# Patient Record
Sex: Male | Born: 1955
Health system: Southern US, Community
[De-identification: ages and names within clinical notes are randomized; demographics above are authoritative.]

## PROBLEM LIST (undated history)

## (undated) DIAGNOSIS — J449 Chronic obstructive pulmonary disease, unspecified: Secondary | ICD-10-CM

## (undated) DIAGNOSIS — F17210 Nicotine dependence, cigarettes, uncomplicated: Secondary | ICD-10-CM

## (undated) DIAGNOSIS — I10 Essential (primary) hypertension: Secondary | ICD-10-CM

## (undated) DIAGNOSIS — E785 Hyperlipidemia, unspecified: Secondary | ICD-10-CM

## (undated) DIAGNOSIS — F209 Schizophrenia, unspecified: Secondary | ICD-10-CM

## (undated) HISTORY — DX: Schizophrenia, unspecified: F20.9

## (undated) HISTORY — PX: NO PAST SURGERIES: SHX2092

---

## 2001-10-07 ENCOUNTER — Inpatient Hospital Stay (HOSPITAL_COMMUNITY): Admission: EM | Admit: 2001-10-07 | Discharge: 2001-10-13 | Payer: Self-pay | Admitting: Emergency Medicine

## 2001-10-07 ENCOUNTER — Encounter: Payer: Self-pay | Admitting: Emergency Medicine

## 2001-10-10 ENCOUNTER — Encounter: Payer: Self-pay | Admitting: Pulmonary Disease

## 2001-10-11 ENCOUNTER — Encounter: Payer: Self-pay | Admitting: Pulmonary Disease

## 2006-11-15 ENCOUNTER — Emergency Department (HOSPITAL_COMMUNITY): Admission: EM | Admit: 2006-11-15 | Discharge: 2006-11-15 | Payer: Self-pay | Admitting: Family Medicine

## 2008-08-08 ENCOUNTER — Ambulatory Visit (HOSPITAL_BASED_OUTPATIENT_CLINIC_OR_DEPARTMENT_OTHER): Admission: RE | Admit: 2008-08-08 | Discharge: 2008-08-08 | Payer: Self-pay | Admitting: Pulmonary Disease

## 2008-08-20 ENCOUNTER — Ambulatory Visit: Payer: Self-pay | Admitting: Internal Medicine

## 2009-05-29 ENCOUNTER — Ambulatory Visit (HOSPITAL_COMMUNITY): Admission: RE | Admit: 2009-05-29 | Discharge: 2009-05-29 | Payer: Self-pay | Admitting: Pulmonary Disease

## 2009-09-11 ENCOUNTER — Ambulatory Visit (HOSPITAL_COMMUNITY): Admission: RE | Admit: 2009-09-11 | Discharge: 2009-09-11 | Payer: Self-pay | Admitting: Pulmonary Disease

## 2010-07-30 NOTE — Procedures (Signed)
NAME:  RAE, SUTCLIFFE NO.:  [id number]   MEDICAL RECORD NO.:  [id number]          PATIENT TYPE:  OUT   LOCATION:  SLEEP CENTER                 FACILITY:  [facility]   PHYSICIAN:  Clinton D. Maple Hudson, MD, FCCP, FACPDATE OF BIRTH:  [birth year] 09[birth year] 1958   DATE OF STUDY:  08/08/2008                            NOCTURNAL POLYSOMNOGRAM   REFERRING PHYSICIAN:  Mina Marble, M.D.   REFERRING PHYSICIAN:  Mina Marble, MD   INDICATION FOR STUDY:  Hypersomnia with sleep apnea.   EPWORTH SLEEPINESS SCORE:  3/24, BMI 19, weight 140 pounds, height 72  inches, neck 14.5 inches.   MEDICATIONS:  Home medications are charted and reviewed.  The study was  done as a daytime study to correspond to the patient's work and sleep  schedule.   SLEEP ARCHITECTURE:  The study was begun at 9:16 a.m.  Lights returned  on at 1517.  Total sleep time, no minutes of sleep.  He was awake  throughout the reporting.   RESPIRATORY DATA:   OXYGEN DATA:   CARDIAC DATA:   MOVEMENT-PARASOMNIA:   IMPRESSIONS-RECOMMENDATIONS:  1. Daytime scheduled polysomnogram to accommodate the patient's usual      sleep schedule with no sleep medication taken.  2. The patient did not achieve sleep during this recording.  He had      previously indicated inability to sleep at night.  Further clinical      discussion may be useful to determine sleep habits, contributory      and antagonistic factors.  On arrival, he reported that he had      slept a total of 3 hours on the night before this study but then      admitted he went      to bed at 2200 and awoke at 0600 hours.  Hypoxia was noted with      exertion walking to the bathroom.  That effort dropped room air      saturation from 92% at rest on arrival on room air to 87% during      exertion.      Clinton D. Maple Hudson, MD, [facility], FACP  Diplomate, Biomedical engineer of Sleep Medicine  Electronically Signed     CDY/MEDQ  D:  08/20/2008 15:11:48  T:   08/21/2008 03:41:04  Job:  [id number]

## 2010-08-02 NOTE — Discharge Summary (Signed)
NAMEDELMAS, FAUCETT NO.:  [id number]   MEDICAL RECORD NO.:  [id number]                   PATIENT TYPE:  INP   LOCATION:  4703                                 FACILITY:  [facility]   PHYSICIAN:  Mina Marble, M.D.          DATE OF BIRTH:  1955-10-29   DATE OF ADMISSION:  10/07/2001  DATE OF DISCHARGE:  10/13/2001                                 DISCHARGE SUMMARY   ADMITTING DIAGNOSES:  1. Pneumonia, bibasilar.  2. Chronic obstructive pulmonary disease, severe, with asthma.  3. Bullous pulmonary emphysema, severe.  4. Hypertensive cardiovascular disease, mild.  5. Mental impairment, moderate.  6. History of tobacco abuse.   DISCHARGE DIAGNOSES:  1. Pneumonia, right lower lobe, resolving.  2. Chronic obstructive pulmonary disease, severe, with asthma.  3. Bullous pulmonary emphysema, severe.  4. Hypertensive cardiovascular disease, history of.  5. Mental impairment with episodes of agitation.   REASON FOR HOSPITALIZATION:  The patient is a 55 year old black male with  known severe COPD with bullous emphysema, who was admitted from the [facility] ED with complaints of increasing shortness of breath for six  to seven days, becoming more severe this a.m., and is admitted with  pneumonia for further care.   ALLERGIES:  No known drug allergies.   LABORATORY AND ACCESSORY CLINICAL DATA:  EKG on October 07, 2001 showed sinus  tachycardia.  CT of the chest showed peripheral opacities in both lower  lobes, right side greater than left, suspicious for pneumonia, no other  evidence of mass or adenopathy, severe bullous emphysema.  Chest x-ray on  October 10, 2001 showed severe bullous emphysema and focal rounded opacity at  the right lung base.   On October 07, 2001, CBC was within normal limits.  On October 07, 2001, chemistry  was within normal limits except for sodium of 133, glucose of 114, BUN of 5.  On October 07, 2001, theophylline level was 2.8.   On October 11, 2001,  theophylline level of 7.9.  On October 07, 2001, blood cultures -- no growth  after five days.  Respiratory culture just showed normal oropharyngeal  flora.   HOSPITAL COURSE:  The patient was admitted.  IV therapy was initiated.  Sputum cultures and blood cultures were obtained and the patient was then  started on Rocephin 1 g IV q.d.  Also, ABG was obtained on room air.  The  patient was started on albuterol nebulizer treatments q.4h. and he was also  started on theophylline CR 100 mg q.a.m. and q.h.s. and Humibid LA one p.o.  q.a.m. and q.h.s.; also added to the plan of care was Flovent metered-dose  inhaler 110 two puffs q.a.m. and q.h.s. and also Biaxin 500 mg tab p.o.  b.i.d.  He had chest x-ray obtained.  Also, his Biaxin was discontinued.  He  also had a chest CT.  He was started on Tussionex suspension 5 cc q.12h. and  give  for his cough.  His IV was discontinued.   DISCHARGE CONDITION:  The patient was discharged home in stable condition.  The patient continued to improve with less cough and he was able to walk  without severe shortness of breath and his appetite was better.   DISCHARGE MEDICATIONS:  1. Theophylline CR 300 mg tab one q.a.m. and q.h.s.  2. Flovent 110 metered-dose inhaler two puffs q.a.m. and q.h.s. or Advair     250/50 q.a.m. and q.h.s.  3. Ceftin 250 mg tab one b.i.d. x5 more days.  4. Avalide 150/12.5 mg tab one q.d.  5. Tussionex suspension 5 cc q.12h. p.r.n. for cough.  6. Albuterol metered-dose inhaler two puffs q.4h. p.r.n. for wheezing and     for frequent cough.  7. Risperdal 0.25 mg tab one q.h.s.   ACTIVITY:  Ambulated as tolerated.   DIET:  Regular, no added-salt diet.   FOLLOWUP:  Call the office for followup office visit in September 2003.      Dionne D. Retta Mac, M.D.    DDR/MEDQ  D:  11/02/2001  T:  11/05/2001  Job:  [id number]

## 2010-09-10 ENCOUNTER — Other Ambulatory Visit: Payer: Self-pay | Admitting: Gastroenterology

## 2010-09-10 DIAGNOSIS — R1013 Epigastric pain: Secondary | ICD-10-CM

## 2010-09-12 ENCOUNTER — Ambulatory Visit
Admission: RE | Admit: 2010-09-12 | Discharge: 2010-09-12 | Disposition: A | Discharge status: Home / Self Care | Payer: Medicare Other | Source: Ambulatory Visit | Attending: Gastroenterology | Admitting: Gastroenterology

## 2010-09-12 DIAGNOSIS — R1013 Epigastric pain: Secondary | ICD-10-CM

## 2011-05-20 DIAGNOSIS — L821 Other seborrheic keratosis: Secondary | ICD-10-CM | POA: Diagnosis not present

## 2011-05-20 DIAGNOSIS — I10 Essential (primary) hypertension: Secondary | ICD-10-CM | POA: Diagnosis not present

## 2011-05-20 DIAGNOSIS — F17200 Nicotine dependence, unspecified, uncomplicated: Secondary | ICD-10-CM | POA: Diagnosis not present

## 2011-05-20 DIAGNOSIS — J449 Chronic obstructive pulmonary disease, unspecified: Secondary | ICD-10-CM | POA: Diagnosis not present

## 2011-05-20 DIAGNOSIS — E7800 Pure hypercholesterolemia, unspecified: Secondary | ICD-10-CM | POA: Diagnosis not present

## 2011-09-23 DIAGNOSIS — E7800 Pure hypercholesterolemia, unspecified: Secondary | ICD-10-CM | POA: Diagnosis not present

## 2011-09-23 DIAGNOSIS — Z79899 Other long term (current) drug therapy: Secondary | ICD-10-CM | POA: Diagnosis not present

## 2011-09-23 DIAGNOSIS — K269 Duodenal ulcer, unspecified as acute or chronic, without hemorrhage or perforation: Secondary | ICD-10-CM | POA: Diagnosis not present

## 2011-09-23 DIAGNOSIS — K2900 Acute gastritis without bleeding: Secondary | ICD-10-CM | POA: Diagnosis not present

## 2011-09-23 DIAGNOSIS — J449 Chronic obstructive pulmonary disease, unspecified: Secondary | ICD-10-CM | POA: Diagnosis not present

## 2012-01-20 DIAGNOSIS — F17200 Nicotine dependence, unspecified, uncomplicated: Secondary | ICD-10-CM | POA: Diagnosis not present

## 2012-01-20 DIAGNOSIS — Z79899 Other long term (current) drug therapy: Secondary | ICD-10-CM | POA: Diagnosis not present

## 2012-01-20 DIAGNOSIS — Z23 Encounter for immunization: Secondary | ICD-10-CM | POA: Diagnosis not present

## 2012-01-20 DIAGNOSIS — E7800 Pure hypercholesterolemia, unspecified: Secondary | ICD-10-CM | POA: Diagnosis not present

## 2012-01-20 DIAGNOSIS — K269 Duodenal ulcer, unspecified as acute or chronic, without hemorrhage or perforation: Secondary | ICD-10-CM | POA: Diagnosis not present

## 2012-01-20 DIAGNOSIS — J449 Chronic obstructive pulmonary disease, unspecified: Secondary | ICD-10-CM | POA: Diagnosis not present

## 2012-05-13 DIAGNOSIS — J449 Chronic obstructive pulmonary disease, unspecified: Secondary | ICD-10-CM | POA: Diagnosis not present

## 2012-05-13 DIAGNOSIS — Z79899 Other long term (current) drug therapy: Secondary | ICD-10-CM | POA: Diagnosis not present

## 2012-05-13 DIAGNOSIS — M25529 Pain in unspecified elbow: Secondary | ICD-10-CM | POA: Diagnosis not present

## 2012-05-13 DIAGNOSIS — E7800 Pure hypercholesterolemia, unspecified: Secondary | ICD-10-CM | POA: Diagnosis not present

## 2012-05-13 DIAGNOSIS — K269 Duodenal ulcer, unspecified as acute or chronic, without hemorrhage or perforation: Secondary | ICD-10-CM | POA: Diagnosis not present

## 2012-08-10 DIAGNOSIS — E7800 Pure hypercholesterolemia, unspecified: Secondary | ICD-10-CM | POA: Diagnosis not present

## 2012-08-10 DIAGNOSIS — J449 Chronic obstructive pulmonary disease, unspecified: Secondary | ICD-10-CM | POA: Diagnosis not present

## 2012-08-10 DIAGNOSIS — K269 Duodenal ulcer, unspecified as acute or chronic, without hemorrhage or perforation: Secondary | ICD-10-CM | POA: Diagnosis not present

## 2012-08-10 DIAGNOSIS — Z79899 Other long term (current) drug therapy: Secondary | ICD-10-CM | POA: Diagnosis not present

## 2012-12-27 DIAGNOSIS — Z125 Encounter for screening for malignant neoplasm of prostate: Secondary | ICD-10-CM | POA: Diagnosis not present

## 2012-12-27 DIAGNOSIS — K269 Duodenal ulcer, unspecified as acute or chronic, without hemorrhage or perforation: Secondary | ICD-10-CM | POA: Diagnosis not present

## 2012-12-27 DIAGNOSIS — J449 Chronic obstructive pulmonary disease, unspecified: Secondary | ICD-10-CM | POA: Diagnosis not present

## 2012-12-27 DIAGNOSIS — Z006 Encounter for examination for normal comparison and control in clinical research program: Secondary | ICD-10-CM | POA: Diagnosis not present

## 2012-12-27 DIAGNOSIS — J4489 Other specified chronic obstructive pulmonary disease: Secondary | ICD-10-CM | POA: Diagnosis not present

## 2012-12-27 DIAGNOSIS — Z23 Encounter for immunization: Secondary | ICD-10-CM | POA: Diagnosis not present

## 2012-12-27 DIAGNOSIS — F319 Bipolar disorder, unspecified: Secondary | ICD-10-CM | POA: Diagnosis not present

## 2012-12-27 DIAGNOSIS — I119 Hypertensive heart disease without heart failure: Secondary | ICD-10-CM | POA: Diagnosis not present

## 2012-12-27 DIAGNOSIS — E7800 Pure hypercholesterolemia, unspecified: Secondary | ICD-10-CM | POA: Diagnosis not present

## 2012-12-27 DIAGNOSIS — Z79899 Other long term (current) drug therapy: Secondary | ICD-10-CM | POA: Diagnosis not present

## 2013-04-11 DIAGNOSIS — E7800 Pure hypercholesterolemia, unspecified: Secondary | ICD-10-CM | POA: Diagnosis not present

## 2013-04-11 DIAGNOSIS — K269 Duodenal ulcer, unspecified as acute or chronic, without hemorrhage or perforation: Secondary | ICD-10-CM | POA: Diagnosis not present

## 2013-04-11 DIAGNOSIS — I119 Hypertensive heart disease without heart failure: Secondary | ICD-10-CM | POA: Diagnosis not present

## 2013-04-11 DIAGNOSIS — Z125 Encounter for screening for malignant neoplasm of prostate: Secondary | ICD-10-CM | POA: Diagnosis not present

## 2013-04-11 DIAGNOSIS — L851 Acquired keratosis [keratoderma] palmaris et plantaris: Secondary | ICD-10-CM | POA: Diagnosis not present

## 2013-04-11 DIAGNOSIS — J449 Chronic obstructive pulmonary disease, unspecified: Secondary | ICD-10-CM | POA: Diagnosis not present

## 2013-04-11 DIAGNOSIS — Z79899 Other long term (current) drug therapy: Secondary | ICD-10-CM | POA: Diagnosis not present

## 2013-06-02 ENCOUNTER — Inpatient Hospital Stay (HOSPITAL_COMMUNITY)
Admission: EM | Admit: 2013-06-02 | Discharge: 2013-06-08 | DRG: 854 | Disposition: A | Discharge status: Home / Self Care | Payer: Medicare Other | Attending: Internal Medicine | Admitting: Internal Medicine

## 2013-06-02 ENCOUNTER — Emergency Department (INDEPENDENT_AMBULATORY_CARE_PROVIDER_SITE_OTHER): Payer: Medicare Other

## 2013-06-02 ENCOUNTER — Encounter (HOSPITAL_COMMUNITY): Payer: Self-pay | Admitting: Emergency Medicine

## 2013-06-02 ENCOUNTER — Emergency Department (INDEPENDENT_AMBULATORY_CARE_PROVIDER_SITE_OTHER)
Admission: EM | Admit: 2013-06-02 | Discharge: 2013-06-02 | Disposition: A | Discharge status: Nursing Home (Includes ICF) | Payer: Medicare Other | Source: Home / Self Care | Attending: Family Medicine | Admitting: Family Medicine

## 2013-06-02 DIAGNOSIS — I10 Essential (primary) hypertension: Secondary | ICD-10-CM | POA: Diagnosis present

## 2013-06-02 DIAGNOSIS — Q445 Other congenital malformations of bile ducts: Secondary | ICD-10-CM | POA: Diagnosis not present

## 2013-06-02 DIAGNOSIS — Z823 Family history of stroke: Secondary | ICD-10-CM | POA: Diagnosis not present

## 2013-06-02 DIAGNOSIS — E785 Hyperlipidemia, unspecified: Secondary | ICD-10-CM | POA: Diagnosis present

## 2013-06-02 DIAGNOSIS — K219 Gastro-esophageal reflux disease without esophagitis: Secondary | ICD-10-CM | POA: Diagnosis present

## 2013-06-02 DIAGNOSIS — Z833 Family history of diabetes mellitus: Secondary | ICD-10-CM

## 2013-06-02 DIAGNOSIS — K8010 Calculus of gallbladder with chronic cholecystitis without obstruction: Secondary | ICD-10-CM | POA: Diagnosis not present

## 2013-06-02 DIAGNOSIS — K8050 Calculus of bile duct without cholangitis or cholecystitis without obstruction: Secondary | ICD-10-CM

## 2013-06-02 DIAGNOSIS — R0989 Other specified symptoms and signs involving the circulatory and respiratory systems: Secondary | ICD-10-CM | POA: Diagnosis present

## 2013-06-02 DIAGNOSIS — R109 Unspecified abdominal pain: Secondary | ICD-10-CM | POA: Diagnosis not present

## 2013-06-02 DIAGNOSIS — R000 Tachycardia, unspecified: Secondary | ICD-10-CM | POA: Diagnosis present

## 2013-06-02 DIAGNOSIS — F329 Major depressive disorder, single episode, unspecified: Secondary | ICD-10-CM | POA: Diagnosis present

## 2013-06-02 DIAGNOSIS — R7989 Other specified abnormal findings of blood chemistry: Secondary | ICD-10-CM | POA: Diagnosis not present

## 2013-06-02 DIAGNOSIS — J439 Emphysema, unspecified: Secondary | ICD-10-CM | POA: Diagnosis present

## 2013-06-02 DIAGNOSIS — K8309 Other cholangitis: Secondary | ICD-10-CM | POA: Diagnosis present

## 2013-06-02 DIAGNOSIS — F3289 Other specified depressive episodes: Secondary | ICD-10-CM | POA: Diagnosis present

## 2013-06-02 DIAGNOSIS — R062 Wheezing: Secondary | ICD-10-CM | POA: Diagnosis present

## 2013-06-02 DIAGNOSIS — R945 Abnormal results of liver function studies: Secondary | ICD-10-CM

## 2013-06-02 DIAGNOSIS — J449 Chronic obstructive pulmonary disease, unspecified: Secondary | ICD-10-CM | POA: Diagnosis present

## 2013-06-02 DIAGNOSIS — R1011 Right upper quadrant pain: Secondary | ICD-10-CM | POA: Diagnosis not present

## 2013-06-02 DIAGNOSIS — A419 Sepsis, unspecified organism: Principal | ICD-10-CM | POA: Diagnosis present

## 2013-06-02 DIAGNOSIS — J441 Chronic obstructive pulmonary disease with (acute) exacerbation: Secondary | ICD-10-CM | POA: Diagnosis present

## 2013-06-02 DIAGNOSIS — K8020 Calculus of gallbladder without cholecystitis without obstruction: Secondary | ICD-10-CM | POA: Diagnosis not present

## 2013-06-02 DIAGNOSIS — Q4479 Other congenital malformations of liver: Secondary | ICD-10-CM

## 2013-06-02 DIAGNOSIS — R933 Abnormal findings on diagnostic imaging of other parts of digestive tract: Secondary | ICD-10-CM | POA: Diagnosis not present

## 2013-06-02 DIAGNOSIS — Z8711 Personal history of peptic ulcer disease: Secondary | ICD-10-CM | POA: Diagnosis not present

## 2013-06-02 DIAGNOSIS — F17200 Nicotine dependence, unspecified, uncomplicated: Secondary | ICD-10-CM

## 2013-06-02 DIAGNOSIS — R0609 Other forms of dyspnea: Secondary | ICD-10-CM | POA: Diagnosis present

## 2013-06-02 DIAGNOSIS — K7689 Other specified diseases of liver: Secondary | ICD-10-CM | POA: Diagnosis present

## 2013-06-02 DIAGNOSIS — F209 Schizophrenia, unspecified: Secondary | ICD-10-CM | POA: Diagnosis present

## 2013-06-02 DIAGNOSIS — Q447 Other congenital malformations of liver: Secondary | ICD-10-CM

## 2013-06-02 DIAGNOSIS — Q441 Other congenital malformations of gallbladder: Secondary | ICD-10-CM

## 2013-06-02 DIAGNOSIS — E8801 Alpha-1-antitrypsin deficiency: Secondary | ICD-10-CM | POA: Diagnosis present

## 2013-06-02 HISTORY — DX: Nicotine dependence, cigarettes, uncomplicated: F17.210

## 2013-06-02 HISTORY — DX: Essential (primary) hypertension: I10

## 2013-06-02 HISTORY — DX: Hyperlipidemia, unspecified: E78.5

## 2013-06-02 HISTORY — DX: Chronic obstructive pulmonary disease, unspecified: J44.9

## 2013-06-02 LAB — CBC WITH DIFFERENTIAL/PLATELET
BASOS ABS: 0 10*3/uL (ref 0.0–0.1)
BASOS PCT: 0 % (ref 0–1)
EOS ABS: 0 10*3/uL (ref 0.0–0.7)
EOS PCT: 0 % (ref 0–5)
HEMATOCRIT: 44.8 % (ref 39.0–52.0)
Hemoglobin: 16.2 g/dL (ref 13.0–17.0)
LYMPHS PCT: 5 % — AB (ref 12–46)
Lymphs Abs: 0.7 10*3/uL (ref 0.7–4.0)
MCH: 31.5 pg (ref 26.0–34.0)
MCHC: 36.2 g/dL — AB (ref 30.0–36.0)
MCV: 87 fL (ref 78.0–100.0)
MONO ABS: 0.8 10*3/uL (ref 0.1–1.0)
Monocytes Relative: 6 % (ref 3–12)
Neutro Abs: 12.8 10*3/uL — ABNORMAL HIGH (ref 1.7–7.7)
Neutrophils Relative %: 89 % — ABNORMAL HIGH (ref 43–77)
PLATELETS: 237 10*3/uL (ref 150–400)
RBC: 5.15 MIL/uL (ref 4.22–5.81)
RDW: 13.8 % (ref 11.5–15.5)
WBC: 14.4 10*3/uL — ABNORMAL HIGH (ref 4.0–10.5)

## 2013-06-02 LAB — URINE MICROSCOPIC-ADD ON

## 2013-06-02 LAB — COMPREHENSIVE METABOLIC PANEL
ALBUMIN: 4 g/dL (ref 3.5–5.2)
ALK PHOS: 110 U/L (ref 39–117)
ALT: 349 U/L — AB (ref 0–53)
AST: 554 U/L — ABNORMAL HIGH (ref 0–37)
BILIRUBIN TOTAL: 2.6 mg/dL — AB (ref 0.3–1.2)
BUN: 6 mg/dL (ref 6–23)
CALCIUM: 9.5 mg/dL (ref 8.4–10.5)
CO2: 25 meq/L (ref 19–32)
CREATININE: 0.59 mg/dL (ref 0.50–1.35)
Chloride: 94 mEq/L — ABNORMAL LOW (ref 96–112)
GFR calc Af Amer: >90 mL/min (ref >90)
GFR calc non Af Amer: >90 mL/min (ref >90)
Glucose, Bld: 150 mg/dL — ABNORMAL HIGH (ref 70–99)
Potassium: 3.9 mEq/L (ref 3.7–5.3)
Sodium: 135 mEq/L — ABNORMAL LOW (ref 137–147)
Total Protein: 7.9 g/dL (ref 6.0–8.3)

## 2013-06-02 LAB — URINALYSIS, ROUTINE W REFLEX MICROSCOPIC
Glucose, UA: NEGATIVE mg/dL
HGB URINE DIPSTICK: NEGATIVE
KETONES UR: 15 mg/dL — AB
NITRITE: POSITIVE — AB
PH: 5.5 (ref 5.0–8.0)
Protein, ur: NEGATIVE mg/dL
Specific Gravity, Urine: 1.021 (ref 1.005–1.030)
Urobilinogen, UA: 4 mg/dL — ABNORMAL HIGH (ref 0.0–1.0)

## 2013-06-02 LAB — LIPASE, BLOOD: LIPASE: 18 U/L (ref 11–59)

## 2013-06-02 MED ORDER — ONDANSETRON 4 MG PO TBDP
8.0000 mg | ORAL_TABLET | Freq: Once | ORAL | Status: AC
  Administered 2013-06-02: 8 mg via ORAL
  Filled 2013-06-02: qty 2

## 2013-06-02 NOTE — ED Provider Notes (Signed)
CSN: [id number]     Arrival date & time 06/02/13  1826 History   First MD Initiated Contact with Patient 06/02/13 1917     Chief Complaint  Patient presents with  . Abdominal Pain   (Consider location/radiation/quality/duration/timing/severity/associated sxs/prior Treatment) Patient is a 58 y.o. male presenting with abdominal pain. The history is provided by the patient and a relative.  Abdominal Pain Pain location:  RUQ Pain quality: burning and sharp   Pain radiates to:  Does not radiate Pain severity:  Moderate Onset quality:  Sudden Duration:  3 hours Timing:  Constant Progression:  Unchanged Chronicity:  New Context comment:  Onset after supper meal. Ineffective treatments:  Antacids Associated symptoms: cough and shortness of breath   Associated symptoms: no anorexia, no belching, no fever, no nausea and no vomiting   Risk factors comment:  H/o ulcer, copd.   History reviewed. No pertinent past medical history. History reviewed. No pertinent past surgical history. No family history on file. History  Substance Use Topics  . Smoking status: Not on file  . Smokeless tobacco: Not on file  . Alcohol Use: Not on file    Review of Systems  Constitutional: Negative.  Negative for fever.  Respiratory: Positive for cough, shortness of breath and wheezing.   Cardiovascular: Negative for leg swelling.  Gastrointestinal: Positive for abdominal pain and abdominal distention. Negative for nausea, vomiting and anorexia.    Allergies  Review of patient's allergies indicates no known allergies.  Home Medications  No current outpatient prescriptions on file. BP 119/74  Pulse 103  Temp(Src) 97.9 F (36.6 C) (Oral)  Resp 24  SpO2 92% Physical Exam  Nursing note and vitals reviewed. Constitutional: He is oriented to person, place, and time. He appears cachectic. He appears ill.  Cardiovascular: Normal heart sounds.   Pulmonary/Chest: He has decreased breath sounds. He has  wheezes. He has rhonchi.  Abdominal: Soft. Bowel sounds are normal. He exhibits distension. He exhibits no mass. There is tenderness. There is no rebound and no guarding.  Lymphadenopathy:    He has no cervical adenopathy.  Neurological: He is alert and oriented to person, place, and time.  Skin: Skin is warm and dry.    ED Course  Procedures (including critical care time) Labs Review Labs Reviewed - No data to display Imaging Review Dg Chest 2 View  06/02/2013   CLINICAL DATA:  Shortness of breath  EXAM: CHEST  2 VIEW  COMPARISON:  None. There is imaging from 2003 which is not available.  FINDINGS: The lungs are maximally hyperinflated. There is diffuse emphysematous change, with numerous bullae. The interstitium between the bullae is thickened. No available priors to establish stability. No cardiomegaly. No evidence of effusion or air leak. No asymmetric lung opacity to suggest consolidating pneumonia.  IMPRESSION: 1. Bullous emphysema. Due to the extensive disease and young age, alpha-1 antitrypsin deficiency should be considered. 2. Interstitial thickening which could be chronic or from interstitial edema. No cardiomegaly.   Electronically Signed   By: Jorje Guild M.D.   On: 06/02/2013 20:01   X-rays reviewed and report per radiologist.   MDM   1. Abdominal pain of unknown cause   2. Bullous emphysema     Pt with severe bullous emphysema, and acute abd pain ruq, sent for further eval.     Billy Fischer, MD 06/02/13 2020

## 2013-06-02 NOTE — ED Notes (Signed)
Pt. transferred from [facility] , reports RUQ pain onset this afternoon and progressing exertional dyspnea today , chest x-ray done at urgent care , denies nausea or vomitting . No fever , diarrhea or chills. Pt. stated history of COPD/Emphysema - still smokes cigarettes.

## 2013-06-02 NOTE — ED Notes (Signed)
Patient complains of lower right quadrant pain Pain started about three hours ago No vomiting Took pepto earlier

## 2013-06-03 ENCOUNTER — Inpatient Hospital Stay (HOSPITAL_COMMUNITY): Payer: Medicare Other

## 2013-06-03 ENCOUNTER — Encounter (HOSPITAL_COMMUNITY)
Admission: EM | Disposition: A | Discharge status: Home / Self Care | Payer: Self-pay | Source: Home / Self Care | Attending: Internal Medicine

## 2013-06-03 ENCOUNTER — Emergency Department (HOSPITAL_COMMUNITY): Payer: Medicare Other

## 2013-06-03 ENCOUNTER — Encounter (HOSPITAL_COMMUNITY): Payer: Self-pay | Admitting: Radiology

## 2013-06-03 ENCOUNTER — Encounter (HOSPITAL_COMMUNITY): Payer: Medicare Other | Admitting: Anesthesiology

## 2013-06-03 ENCOUNTER — Inpatient Hospital Stay (HOSPITAL_COMMUNITY): Payer: Medicare Other | Admitting: Anesthesiology

## 2013-06-03 DIAGNOSIS — A419 Sepsis, unspecified organism: Secondary | ICD-10-CM

## 2013-06-03 DIAGNOSIS — R933 Abnormal findings on diagnostic imaging of other parts of digestive tract: Secondary | ICD-10-CM

## 2013-06-03 DIAGNOSIS — R7989 Other specified abnormal findings of blood chemistry: Secondary | ICD-10-CM

## 2013-06-03 DIAGNOSIS — R109 Unspecified abdominal pain: Secondary | ICD-10-CM

## 2013-06-03 DIAGNOSIS — E785 Hyperlipidemia, unspecified: Secondary | ICD-10-CM | POA: Diagnosis present

## 2013-06-03 DIAGNOSIS — K8309 Other cholangitis: Secondary | ICD-10-CM

## 2013-06-03 DIAGNOSIS — I10 Essential (primary) hypertension: Secondary | ICD-10-CM | POA: Diagnosis present

## 2013-06-03 DIAGNOSIS — R945 Abnormal results of liver function studies: Secondary | ICD-10-CM

## 2013-06-03 DIAGNOSIS — J441 Chronic obstructive pulmonary disease with (acute) exacerbation: Secondary | ICD-10-CM | POA: Diagnosis present

## 2013-06-03 DIAGNOSIS — K8050 Calculus of bile duct without cholangitis or cholecystitis without obstruction: Secondary | ICD-10-CM

## 2013-06-03 DIAGNOSIS — J449 Chronic obstructive pulmonary disease, unspecified: Secondary | ICD-10-CM | POA: Diagnosis present

## 2013-06-03 HISTORY — DX: Unspecified abdominal pain: R10.9

## 2013-06-03 HISTORY — PX: ERCP: SHX5425

## 2013-06-03 LAB — CBC WITH DIFFERENTIAL/PLATELET
BASOS PCT: 0 % (ref 0–1)
Basophils Absolute: 0 10*3/uL (ref 0.0–0.1)
EOS ABS: 0 10*3/uL (ref 0.0–0.7)
Eosinophils Relative: 0 % (ref 0–5)
HCT: 41.7 % (ref 39.0–52.0)
Hemoglobin: 14.6 g/dL (ref 13.0–17.0)
Lymphocytes Relative: 5 % — ABNORMAL LOW (ref 12–46)
Lymphs Abs: 0.6 10*3/uL — ABNORMAL LOW (ref 0.7–4.0)
MCH: 30.6 pg (ref 26.0–34.0)
MCHC: 35 g/dL (ref 30.0–36.0)
MCV: 87.4 fL (ref 78.0–100.0)
Monocytes Absolute: 0.8 10*3/uL (ref 0.1–1.0)
Monocytes Relative: 6 % (ref 3–12)
NEUTROS ABS: 12.1 10*3/uL — AB (ref 1.7–7.7)
NEUTROS PCT: 89 % — AB (ref 43–77)
PLATELETS: 191 10*3/uL (ref 150–400)
RBC: 4.77 MIL/uL (ref 4.22–5.81)
RDW: 13.8 % (ref 11.5–15.5)
WBC: 13.5 10*3/uL — ABNORMAL HIGH (ref 4.0–10.5)

## 2013-06-03 LAB — HEPATIC FUNCTION PANEL
ALT: 513 U/L — ABNORMAL HIGH (ref 0–53)
AST: 550 U/L — AB (ref 0–37)
Albumin: 3.2 g/dL — ABNORMAL LOW (ref 3.5–5.2)
Alkaline Phosphatase: 115 U/L (ref 39–117)
BILIRUBIN DIRECT: 3.4 mg/dL — AB (ref 0.0–0.3)
BILIRUBIN TOTAL: 3.9 mg/dL — AB (ref 0.3–1.2)
Indirect Bilirubin: 0.5 mg/dL (ref 0.3–0.9)
Total Protein: 6.6 g/dL (ref 6.0–8.3)

## 2013-06-03 LAB — BASIC METABOLIC PANEL
BUN: 7 mg/dL (ref 6–23)
CALCIUM: 8.6 mg/dL (ref 8.4–10.5)
CO2: 23 mEq/L (ref 19–32)
Chloride: 97 mEq/L (ref 96–112)
Creatinine, Ser: 0.59 mg/dL (ref 0.50–1.35)
GFR calc non Af Amer: >90 mL/min (ref >90)
GLUCOSE: 137 mg/dL — AB (ref 70–99)
Potassium: 4.3 mEq/L (ref 3.7–5.3)
SODIUM: 135 meq/L — AB (ref 137–147)

## 2013-06-03 LAB — HEPATITIS PANEL, ACUTE
HCV Ab: NEGATIVE
HEP A IGM: NONREACTIVE
HEP B S AG: NEGATIVE
Hep B C IgM: NONREACTIVE

## 2013-06-03 LAB — I-STAT CG4 LACTIC ACID, ED: LACTIC ACID, VENOUS: 2.54 mmol/L — AB (ref 0.5–2.2)

## 2013-06-03 LAB — PROTIME-INR
INR: 0.99 (ref 0.00–1.49)
Prothrombin Time: 12.9 seconds (ref 11.6–15.2)

## 2013-06-03 LAB — ACETAMINOPHEN LEVEL: Acetaminophen (Tylenol), Serum: <15 ug/mL (ref 10–30)

## 2013-06-03 LAB — GLUCOSE, CAPILLARY
GLUCOSE-CAPILLARY: 133 mg/dL — AB (ref 70–99)
Glucose-Capillary: 115 mg/dL — ABNORMAL HIGH (ref 70–99)

## 2013-06-03 SURGERY — ERCP, WITH INTERVENTION IF INDICATED
Anesthesia: General

## 2013-06-03 MED ORDER — GLUCAGON HCL (RDNA) 1 MG IJ SOLR
INTRAMUSCULAR | Status: DC | PRN
  Administered 2013-06-03 (×2): 0.25 mg via INTRAVENOUS

## 2013-06-03 MED ORDER — RISPERIDONE 2 MG PO TABS
4.0000 mg | ORAL_TABLET | Freq: Two times a day (BID) | ORAL | Status: DC
  Administered 2013-06-03 – 2013-06-08 (×10): 4 mg via ORAL
  Filled 2013-06-03 (×12): qty 2

## 2013-06-03 MED ORDER — FENTANYL CITRATE 0.05 MG/ML IJ SOLN
50.0000 ug | Freq: Once | INTRAMUSCULAR | Status: AC
Start: 2013-06-03 — End: 2013-06-03
  Administered 2013-06-03: 50 ug via INTRAVENOUS
  Filled 2013-06-03: qty 2

## 2013-06-03 MED ORDER — ALBUTEROL SULFATE (2.5 MG/3ML) 0.083% IN NEBU
2.5000 mg | INHALATION_SOLUTION | Freq: Three times a day (TID) | RESPIRATORY_TRACT | Status: DC
  Administered 2013-06-03: 2.5 mg via RESPIRATORY_TRACT
  Filled 2013-06-03: qty 3

## 2013-06-03 MED ORDER — LIDOCAINE HCL (CARDIAC) 20 MG/ML IV SOLN
INTRAVENOUS | Status: DC | PRN
  Administered 2013-06-03: 50 mg via INTRAVENOUS

## 2013-06-03 MED ORDER — DEXTROSE 5 % IV SOLN
1.0000 g | Freq: Once | INTRAVENOUS | Status: AC
  Administered 2013-06-03: 1 g via INTRAVENOUS
  Filled 2013-06-03: qty 10

## 2013-06-03 MED ORDER — PROPOFOL 10 MG/ML IV BOLUS
INTRAVENOUS | Status: DC | PRN
  Administered 2013-06-03: 120 mg via INTRAVENOUS
  Administered 2013-06-03: 30 mg via INTRAVENOUS

## 2013-06-03 MED ORDER — IPRATROPIUM-ALBUTEROL 0.5-2.5 (3) MG/3ML IN SOLN
3.0000 mL | Freq: Four times a day (QID) | RESPIRATORY_TRACT | Status: DC
  Administered 2013-06-03 – 2013-06-06 (×11): 3 mL via RESPIRATORY_TRACT
  Filled 2013-06-03 (×11): qty 3

## 2013-06-03 MED ORDER — FENTANYL CITRATE 0.05 MG/ML IJ SOLN: 50.0000 ug | INTRAMUSCULAR | Status: DC | PRN

## 2013-06-03 MED ORDER — BUDESONIDE 0.25 MG/2ML IN SUSP
0.2500 mg | Freq: Two times a day (BID) | RESPIRATORY_TRACT | Status: DC
  Administered 2013-06-03 – 2013-06-07 (×8): 0.25 mg via RESPIRATORY_TRACT
  Filled 2013-06-03 (×12): qty 2

## 2013-06-03 MED ORDER — SODIUM CHLORIDE 0.9 % IV SOLN
INTRAVENOUS | Status: DC | PRN
  Administered 2013-06-03: 17:00:00 via INTRAVENOUS

## 2013-06-03 MED ORDER — IPRATROPIUM BROMIDE 0.02 % IN SOLN
0.5000 mg | Freq: Three times a day (TID) | RESPIRATORY_TRACT | Status: DC
  Administered 2013-06-03: 0.5 mg via RESPIRATORY_TRACT
  Filled 2013-06-03: qty 2.5

## 2013-06-03 MED ORDER — ONDANSETRON HCL 4 MG/2ML IJ SOLN
4.0000 mg | Freq: Once | INTRAMUSCULAR | Status: AC
  Administered 2013-06-03: 4 mg via INTRAVENOUS
  Filled 2013-06-03: qty 2

## 2013-06-03 MED ORDER — FENTANYL CITRATE 0.05 MG/ML IJ SOLN
INTRAMUSCULAR | Status: DC | PRN
  Administered 2013-06-03: 50 ug via INTRAVENOUS

## 2013-06-03 MED ORDER — SODIUM CHLORIDE 0.9 % IV BOLUS (SEPSIS)
1000.0000 mL | Freq: Once | INTRAVENOUS | Status: AC
  Administered 2013-06-03: 1000 mL via INTRAVENOUS

## 2013-06-03 MED ORDER — SODIUM CHLORIDE 0.9 % IV SOLN: INTRAVENOUS | Status: DC

## 2013-06-03 MED ORDER — PIPERACILLIN-TAZOBACTAM 3.375 G IVPB
3.3750 g | Freq: Once | INTRAVENOUS | Status: AC
  Administered 2013-06-03: 3.375 g via INTRAVENOUS
  Filled 2013-06-03: qty 50

## 2013-06-03 MED ORDER — ONDANSETRON HCL 4 MG/2ML IJ SOLN: 4.0000 mg | Freq: Four times a day (QID) | INTRAMUSCULAR | Status: DC | PRN

## 2013-06-03 MED ORDER — ACETAMINOPHEN 325 MG PO TABS
650.0000 mg | ORAL_TABLET | Freq: Once | ORAL | Status: AC
  Administered 2013-06-03: 650 mg via ORAL
  Filled 2013-06-03: qty 2

## 2013-06-03 MED ORDER — PHENYLEPHRINE HCL 10 MG/ML IJ SOLN
INTRAMUSCULAR | Status: DC | PRN
  Administered 2013-06-03: 40 ug via INTRAVENOUS

## 2013-06-03 MED ORDER — ALBUTEROL SULFATE (2.5 MG/3ML) 0.083% IN NEBU
2.5000 mg | INHALATION_SOLUTION | RESPIRATORY_TRACT | Status: DC
  Administered 2013-06-03: 2.5 mg via RESPIRATORY_TRACT
  Filled 2013-06-03: qty 3

## 2013-06-03 MED ORDER — ALBUTEROL SULFATE (2.5 MG/3ML) 0.083% IN NEBU
5.0000 mg | INHALATION_SOLUTION | Freq: Once | RESPIRATORY_TRACT | Status: AC
  Administered 2013-06-03: 5 mg via RESPIRATORY_TRACT
  Filled 2013-06-03: qty 6

## 2013-06-03 MED ORDER — FENTANYL CITRATE 0.05 MG/ML IJ SOLN
50.0000 ug | Freq: Once | INTRAMUSCULAR | Status: AC
  Administered 2013-06-03: 50 ug via INTRAVENOUS
  Filled 2013-06-03: qty 2

## 2013-06-03 MED ORDER — PROMETHAZINE HCL 25 MG/ML IJ SOLN: 6.2500 mg | INTRAMUSCULAR | Status: DC | PRN

## 2013-06-03 MED ORDER — ALBUTEROL SULFATE (2.5 MG/3ML) 0.083% IN NEBU: 2.5000 mg | INHALATION_SOLUTION | RESPIRATORY_TRACT | Status: DC | PRN

## 2013-06-03 MED ORDER — SODIUM CHLORIDE 0.9 % IJ SOLN
3.0000 mL | Freq: Two times a day (BID) | INTRAMUSCULAR | Status: DC
  Administered 2013-06-03 – 2013-06-07 (×5): 3 mL via INTRAVENOUS

## 2013-06-03 MED ORDER — PIPERACILLIN-TAZOBACTAM 3.375 G IVPB
3.3750 g | Freq: Three times a day (TID) | INTRAVENOUS | Status: DC
  Administered 2013-06-03 – 2013-06-08 (×15): 3.375 g via INTRAVENOUS
  Filled 2013-06-03 (×17): qty 50

## 2013-06-03 MED ORDER — IPRATROPIUM-ALBUTEROL 0.5-2.5 (3) MG/3ML IN SOLN: 3.0000 mL | RESPIRATORY_TRACT | Status: DC

## 2013-06-03 MED ORDER — IOHEXOL 300 MG/ML  SOLN
80.0000 mL | Freq: Once | INTRAMUSCULAR | Status: AC | PRN
  Administered 2013-06-03: 80 mL via INTRAVENOUS

## 2013-06-03 MED ORDER — SODIUM CHLORIDE 0.9 % IV SOLN
INTRAVENOUS | Status: DC | PRN
  Administered 2013-06-03: 17:00:00

## 2013-06-03 MED ORDER — HYDROMORPHONE HCL PF 1 MG/ML IJ SOLN: 0.2500 mg | INTRAMUSCULAR | Status: DC | PRN

## 2013-06-03 MED ORDER — ONDANSETRON HCL 4 MG PO TABS: 4.0000 mg | ORAL_TABLET | Freq: Four times a day (QID) | ORAL | Status: DC | PRN

## 2013-06-03 MED ORDER — SUCCINYLCHOLINE CHLORIDE 20 MG/ML IJ SOLN
INTRAMUSCULAR | Status: DC | PRN
  Administered 2013-06-03: 100 mg via INTRAVENOUS

## 2013-06-03 MED ORDER — MIRTAZAPINE 30 MG PO TABS
30.0000 mg | ORAL_TABLET | Freq: Every day | ORAL | Status: DC
  Administered 2013-06-04 – 2013-06-08 (×5): 30 mg via ORAL
  Filled 2013-06-03 (×6): qty 1

## 2013-06-03 MED ORDER — ALBUTEROL SULFATE HFA 108 (90 BASE) MCG/ACT IN AERS
INHALATION_SPRAY | RESPIRATORY_TRACT | Status: DC | PRN
Start: 2013-06-03 — End: 2013-06-03
  Administered 2013-06-03 (×2): 4 via RESPIRATORY_TRACT

## 2013-06-03 MED ORDER — PANTOPRAZOLE SODIUM 40 MG PO TBEC
40.0000 mg | DELAYED_RELEASE_TABLET | Freq: Every day | ORAL | Status: DC
  Administered 2013-06-04 – 2013-06-08 (×5): 40 mg via ORAL
  Filled 2013-06-03 (×5): qty 1

## 2013-06-03 MED ORDER — HYDRALAZINE HCL 20 MG/ML IJ SOLN
10.0000 mg | INTRAMUSCULAR | Status: DC | PRN
Start: 2013-06-03 — End: 2013-06-08

## 2013-06-03 MED ORDER — SODIUM CHLORIDE 0.9 % IV SOLN: 3.0000 g | Freq: Once | INTRAVENOUS | Status: DC

## 2013-06-03 MED ORDER — IPRATROPIUM BROMIDE 0.02 % IN SOLN
0.5000 mg | Freq: Four times a day (QID) | RESPIRATORY_TRACT | Status: DC
  Administered 2013-06-03: 0.5 mg via RESPIRATORY_TRACT
  Filled 2013-06-03: qty 2.5

## 2013-06-03 MED ORDER — GLUCAGON HCL (RDNA) 1 MG IJ SOLR
INTRAMUSCULAR | Status: AC
Start: 2013-06-03 — End: 2013-06-03
  Filled 2013-06-03: qty 2

## 2013-06-03 MED ORDER — SODIUM CHLORIDE 0.9 % IV SOLN
INTRAVENOUS | Status: AC
  Administered 2013-06-03: 500 mL via INTRAVENOUS
  Administered 2013-06-03 (×3): via INTRAVENOUS
  Administered 2013-06-03: 125 mL via INTRAVENOUS

## 2013-06-03 NOTE — Progress Notes (Signed)
PROGRESS NOTE  Blake Mcknight BZJ:[id number] DOB: 05/01/1955 DOA: 06/02/2013 PCP: No PCP Per Patient  HPI/Subjective: 58 yo male presenting to ED on 3/19 RUQ pain and tachycardia, sent from urgent care for further evaluation. Has nausea but no vomiting. Pain is sharp in quality and moderate in severity. No known history of gallstones or gallbladder disease. Is a heavy smoker with history of COPD reporting mild dyspnea.   Assessment/Plan: Sepsis  Likely from biliary infection Patient currently stable.  Suspected Ascending Cholangitis -[facility] consulted.  ERCP scheduled for 3/20. - CT abdomen/ pelvis: Mild intrahepatic biliary ductal dilatation & fatty infiltration of the liver. -US abdomen: (-) gallstones, mild CBD dilation, increased echogenicity of liver -Patient has been placed on Zosyn - PT/ INR WNL  -blood cultures pending  -Acute hepatitis panel pending  -Closely follow LFTs -Given bullous emphysema will check for Alpha 1 antitrypsin deficiency  COPD exacerbation  - DG chest: diffuse emphysematous change, with numerous chronic bullae - patient is wheezing on exam and has been placed on Pulmicort and scheduled nebulizers.  - If continues to wheeze may need IV steroids.  Hypertension  - since patient is n.p.o. patient has been placed on IV hydralazine for systolic blood pressure more than 160.   Hyperlipidemia - hold statins due to elevated LFTs.  Depression  - resume risperidone and remeron after ERCP on 3/20   DVT Prophylaxis:  SCDs  Code Status: full Family Communication: none at bedside  Disposition Plan: remain inpatient.    Consultants:  GI: currently NPO if need for procedure     Objective: Filed Vitals:   06/03/13 0400 06/03/13 0625 06/03/13 0855 06/03/13 1300  BP: 119/73 129/77  105/66  Pulse: 108 102  98  Temp:  100.1 F (37.8 C)  99.4 F (37.4 C)  TempSrc:  Oral  Oral  Resp: 18   22  Height:      Weight:      SpO2: 93% 93% 90% 91%      Intake/Output Summary (Last 24 hours) at 06/03/13 1413 Last data filed at 06/03/13 1241  Gross per 24 hour  Intake     50 ml  Output    500 ml  Net   -450 ml   Filed Weights   06/02/13 2056  Weight: 83.915 kg (185 lb)    Exam: General: Well-developed and moderately nourished. Lying comfortably in bed. Eyes: PERRLA, anicteric ENT: mmm Neck: No mass felt. supple Cardiovascular: S1-S2 heard. RRR Respiratory: Bilateral expiratory wheeze, no accessory muscle use. Abdomen: soft, no tenderness, guarding or rigidity. Non tender, no masses Skin: No rash.  Musculoskeletal: No edema.  Psychiatric: flat affect, 1 word appropriate answers  Neurologic: Alert awake oriented to time place and person. Moves all extremities.   Data Reviewed: Basic Metabolic Panel:  Recent Labs Lab 06/02/13 2106 06/03/13 0650  NA 135* 135*  K 3.9 4.3  CL 94* 97  CO2 25 23  GLUCOSE 150* 137*  BUN 6 7  CREATININE 0.59 0.59  CALCIUM 9.5 8.6   Liver Function Tests:  Recent Labs Lab 06/02/13 2106 06/03/13 0650  AST 554* 550*  ALT 349* 513*  ALKPHOS 110 115  BILITOT 2.6* 3.9*  PROT 7.9 6.6  ALBUMIN 4.0 3.2*    Recent Labs Lab 06/02/13 2106  LIPASE 18   CBC:  Recent Labs Lab 06/02/13 2106 06/03/13 0650  WBC 14.4* 13.5*  NEUTROABS 12.8* 12.1*  HGB 16.2 14.6  HCT 44.8 41.7  MCV 87.0 87.4  PLT 237 191   CBG:  Recent Labs Lab 06/03/13 0709 06/03/13 1156  GLUCAP 133* 115*     Studies: Dg Chest 2 View  06/02/2013   CLINICAL DATA:  Shortness of breath  EXAM: CHEST  2 VIEW  COMPARISON:  None. There is imaging from 2003 which is not available.  FINDINGS: The lungs are maximally hyperinflated. There is diffuse emphysematous change, with numerous bullae. The interstitium between the bullae is thickened. No available priors to establish stability. No cardiomegaly. No evidence of effusion or air leak. No asymmetric lung opacity to suggest consolidating pneumonia.  IMPRESSION: 1.  Bullous emphysema. Due to the extensive disease and young age, alpha-1 antitrypsin deficiency should be considered. 2. Interstitial thickening which could be chronic or from interstitial edema. No cardiomegaly.   Electronically Signed   By: Jorje Guild M.D.   On: 06/02/2013 20:01   US Abdomen Complete  06/03/2013   CLINICAL DATA:  Right upper quadrant pain.  EXAM: ULTRASOUND ABDOMEN COMPLETE  COMPARISON:  Abdominal ultrasound 09/12/2010.  FINDINGS: Gallbladder:  No gallstones or wall thickening visualized. No sonographic Murphy sign noted.  Common bile duct:  Diameter: 0.8 cm.  Liver:  Demonstrates increased echogenicity. No focal lesion is identified. There is intrahepatic biliary ductal dilatation.  IVC:  No abnormality visualized.  Pancreas:  Visualized portion unremarkable.  Spleen:  Size and appearance within normal limits.  Right Kidney:  Length: 11.9 cm. Echogenicity within normal limits. No mass or hydronephrosis visualized.  Left Kidney:  Length: 11.9 cm. Echogenicity within normal limits. No mass or hydronephrosis visualized.  Abdominal aorta:  Not visualized.  Other findings:  None.  IMPRESSION: Intrahepatic biliary ductal dilatation. The common bile duct is mildly dilated measuring 0.8 cm. The cause for dilatation is not identified.  Fatty infiltration of the liver.  Negative for gallstones.   Electronically Signed   By: Inge Rise M.D.   On: 06/03/2013 03:55   Ct Abdomen Pelvis W Contrast  06/03/2013   CLINICAL DATA:  Right upper quadrant abdominal pain. Sepsis. Possible ascending cholangitis.  EXAM: CT ABDOMEN AND PELVIS WITH CONTRAST  TECHNIQUE: Multidetector CT imaging of the abdomen and pelvis was performed using the standard protocol following bolus administration of intravenous contrast.  CONTRAST:  80  ML OMNIPAQUE IOHEXOL 300 MG/ML  SOLN  COMPARISON:  None.  FINDINGS: The lung bases demonstrate severe emphysematous change. No pleural or pericardial effusion. Heart size normal.   The liver is low attenuating consistent with fatty infiltration. There is mild, diffuse intrahepatic biliary ductal dilatation. The common bile duct is unremarkable. No air in the biliary tree is identified. No focal liver lesion or hepatic abscess is seen. The adrenal glands, spleen, pancreas and kidneys appear normal. Aortoiliac atherosclerosis without aneurysm is noted. The stomach, small and large bowel and appendix appear normal. No lymphadenopathy or fluid is identified. There is no focal bony abnormality.  IMPRESSION: Mild intrahepatic biliary ductal dilatation is nonspecific but could be related to cholangitis. The common bile duct appears normal. Negative for hepatic abscess.  Fatty infiltration of the liver.  Severe emphysema.   Electronically Signed   By: Inge Rise M.D.   On: 06/03/2013 06:27    Scheduled Meds: . albuterol  2.5 mg Nebulization TID  . budesonide (PULMICORT) nebulizer solution  0.25 mg Nebulization BID  . ipratropium  0.5 mg Nebulization TID  . mirtazapine  30 mg Oral Daily  . pantoprazole  40 mg Oral Daily  . piperacillin-tazobactam (ZOSYN)  IV  3.375  g Intravenous 3 times per day  . risperidone  4 mg Oral BID  . sodium chloride  3 mL Intravenous Q12H   Continuous Infusions: . sodium chloride 500 mL (06/03/13 1302)  . sodium chloride 20 mL/hr at 06/03/13 1300    Principal Problem:   Sepsis Active Problems:   Abdominal pain   Elevated LFTs   HTN (hypertension)   HLD (hyperlipidemia)   COPD (chronic obstructive pulmonary disease)    Blake Chuck, PA-S Blake Burn, PA-C  [facility] Pager [phone]. If 7PM-7AM, please contact night-coverage at [email or web address], password [facility] 06/03/2013, 2:13 PM  LOS: 1 day

## 2013-06-03 NOTE — H&P (Signed)
[facility] History and Physical  Blake Mcknight [id number] DOB: Jul 15, 1955 DOA: 06/02/2013  Referring physician: ER physician. PCP: No PCP Per Patient   Chief Complaint: Abdominal pain.  HPI: Blake Mcknight is a 59 y.o. male with history of COPD, tobacco abuse, hypertension and hyperlipidemia presented to the ER because of abdominal pain. Patient has been experiencing abdominal pain since yesterday morning and had gone to the urgent care Center and was referred to the ER. In the ER patient was found to be tachycardic and on exam patient had right upper quadrant pain and tenderness. Labs reveal elevated LFTs and patient on exam is febrile. Sonogram of the abdomen shows intrahepatic ductal dilation. At this time CT abdomen and pelvis is pending. Blood cultures have been ordered. Patient denies any nausea vomiting diarrhea. Denies any recent travel or any new medications. Patient otherwise denies any chest pain. Has chronic cough with shortness of breath from COPD and also exam patient has bilateral wheezing.   Review of Systems: As presented in the history of presenting illness, rest negative.  Past Medical History  Diagnosis Date  . COPD (chronic obstructive pulmonary disease)   . Emphysema (subcutaneous) (surgical) resulting from a procedure   . Heavy cigarette smoker   . Hypertension   . HLD (hyperlipidemia)    Past Surgical History  Procedure Laterality Date  . No past surgeries     Social History:  reports that he has been smoking.  He does not have any smokeless tobacco history on file. He reports that he drinks alcohol. He reports that he does not use illicit drugs. Where does patient live home. Can patient participate in ADLs? Yes.  No Known Allergies  Family History:  Family History  Problem Relation Age of Onset  . Diabetes Mellitus II Mother   . CAD Neg Hx   . Stroke Maternal Aunt       Prior to Admission medications   Medication Sig Start Date  End Date Taking? Authorizing Provider  atorvastatin (LIPITOR) 10 MG tablet Take 1 tablet by mouth daily. 05/31/13  Yes Historical Provider, MD  benztropine (COGENTIN) 2 MG tablet Take 1 tablet by mouth 3 (three) times daily. 06/01/13  Yes Historical Provider, MD  cholecalciferol (VITAMIN D) 1000 UNITS tablet Take 1,000 Units by mouth daily.   Yes Historical Provider, MD  Cyanocobalamin (B-12 PO) Take 1 tablet by mouth daily.   Yes Historical Provider, MD  enalapril (VASOTEC) 10 MG tablet Take 1 tablet by mouth daily. 05/31/13  Yes Historical Provider, MD  HYDROcodone-acetaminophen (NORCO) 7.5-325 MG per tablet Take 1 tablet by mouth every 8 (eight) hours as needed. 04/11/13  Yes Historical Provider, MD  mirtazapine (REMERON) 30 MG tablet Take 1 tablet by mouth daily. 04/29/13  Yes Historical Provider, MD  Multiple Vitamins-Minerals (MULTIVITAMIN WITH MINERALS) tablet Take 1 tablet by mouth daily.   Yes Historical Provider, MD  omeprazole (PRILOSEC) 20 MG capsule Take 1 capsule by mouth daily. 03/22/13  Yes Historical Provider, MD  PROAIR HFA 108 (90 BASE) MCG/ACT inhaler Inhale 2 puffs into the lungs 2 (two) times daily.  05/12/13  Yes Historical Provider, MD  risperidone (RISPERDAL) 4 MG tablet Take 1 tablet by mouth 2 (two) times daily. 06/02/13  Yes Historical Provider, MD  vitamin C (ASCORBIC ACID) 500 MG tablet Take 500 mg by mouth daily.   Yes Historical Provider, MD  famotidine (PEPCID) 40 MG tablet Take 1 tablet by mouth 2 (two) times daily. 04/11/13   Historical Provider,  MD    Physical Exam: Filed Vitals:   06/03/13 0200 06/03/13 0247 06/03/13 0300 06/03/13 0400  BP: 132/87 132/87 128/77 119/73  Pulse: 111 110 109 108  Temp:      TempSrc:      Resp: 27 23 25 18   Height:      Weight:      SpO2: 92% 94% 94% 93%     General:  Well-developed and moderately nourished.  Eyes: Anicteric no pallor.  ENT: No discharge from the ears eyes nose mouth.  Neck: No mass felt.  Cardiovascular:  S1-S2 heard.  Respiratory: Bilateral expiratory wheeze heard no crepitations.  Abdomen: Right upper quadrant tenderness no guarding or rigidity.  Skin: No rash.  Musculoskeletal: No edema.  Psychiatric: Appears normal.  Neurologic: Alert awake oriented to time place and person. Moves all extremities.  Labs on Admission:  Basic Metabolic Panel:  Recent Labs Lab 06/02/13 2106  NA 135*  K 3.9  CL 94*  CO2 25  GLUCOSE 150*  BUN 6  CREATININE 0.59  CALCIUM 9.5   Liver Function Tests:  Recent Labs Lab 06/02/13 2106  AST 554*  ALT 349*  ALKPHOS 110  BILITOT 2.6*  PROT 7.9  ALBUMIN 4.0    Recent Labs Lab 06/02/13 2106  LIPASE 18   No results found for this basename: AMMONIA,  in the last 168 hours CBC:  Recent Labs Lab 06/02/13 2106  WBC 14.4*  NEUTROABS 12.8*  HGB 16.2  HCT 44.8  MCV 87.0  PLT 237   Cardiac Enzymes: No results found for this basename: CKTOTAL, CKMB, CKMBINDEX, TROPONINI,  in the last 168 hours  BNP (last 3 results) No results found for this basename: PROBNP,  in the last 8760 hours CBG: No results found for this basename: GLUCAP,  in the last 168 hours  Radiological Exams on Admission: Dg Chest 2 View  06/02/2013   CLINICAL DATA:  Shortness of breath  EXAM: CHEST  2 VIEW  COMPARISON:  None. There is imaging from 2003 which is not available.  FINDINGS: The lungs are maximally hyperinflated. There is diffuse emphysematous change, with numerous bullae. The interstitium between the bullae is thickened. No available priors to establish stability. No cardiomegaly. No evidence of effusion or air leak. No asymmetric lung opacity to suggest consolidating pneumonia.  IMPRESSION: 1. Bullous emphysema. Due to the extensive disease and young age, alpha-1 antitrypsin deficiency should be considered. 2. Interstitial thickening which could be chronic or from interstitial edema. No cardiomegaly.   Electronically Signed   By: Jorje Guild M.D.   On:  06/02/2013 20:01   US Abdomen Complete  06/03/2013   CLINICAL DATA:  Right upper quadrant pain.  EXAM: ULTRASOUND ABDOMEN COMPLETE  COMPARISON:  Abdominal ultrasound 09/12/2010.  FINDINGS: Gallbladder:  No gallstones or wall thickening visualized. No sonographic Murphy sign noted.  Common bile duct:  Diameter: 0.8 cm.  Liver:  Demonstrates increased echogenicity. No focal lesion is identified. There is intrahepatic biliary ductal dilatation.  IVC:  No abnormality visualized.  Pancreas:  Visualized portion unremarkable.  Spleen:  Size and appearance within normal limits.  Right Kidney:  Length: 11.9 cm. Echogenicity within normal limits. No mass or hydronephrosis visualized.  Left Kidney:  Length: 11.9 cm. Echogenicity within normal limits. No mass or hydronephrosis visualized.  Abdominal aorta:  Not visualized.  Other findings:  None.  IMPRESSION: Intrahepatic biliary ductal dilatation. The common bile duct is mildly dilated measuring 0.8 cm. The cause for dilatation is not identified.  Fatty infiltration of the liver.  Negative for gallstones.   Electronically Signed   By: Inge Rise M.D.   On: 06/03/2013 03:55     Assessment/Plan Principal Problem:   Sepsis Active Problems:   Abdominal pain   Elevated LFTs   HTN (hypertension)   HLD (hyperlipidemia)   COPD (chronic obstructive pulmonary disease)   1. Sepsis - suspected ascending cholangitis. Patient has been placed on Zosyn after blood cultures obtained. CT abdomen and pelvis is pending. Patient is kept n.p.o. with pain relief medications and hydration. Consult GI for further recommendations. Acute hepatitis panel Tylenol levels INR has been sent and is pending. Closely follow LFTs. 2. COPD exacerbation - patient is wheezing on exam and has been placed on Pulmicort and nebulizers. If continues to wheeze may need IV steroids. 3. Hypertension - since patient is n.p.o. patient has been placed on IV hydralazine for systolic blood pressure  more than 160. 4. Hyperlipidemia - hold statins due to elevated LFTs. 5. Depression - hold anti-psychiatric medications for now.    Code Status: Full code.  Family Communication: Patient's family at the bedside.  Disposition Plan: Admit to inpatient.    Mico Spark N. [facility] Pager [phone].  If 7PM-7AM, please contact night-coverage [email or web address] Password [facility] 06/03/2013, 5:41 AM

## 2013-06-03 NOTE — Progress Notes (Signed)
ANTIBIOTIC CONSULT NOTE - INITIAL  Pharmacy Consult for Zosyn Indication: rule out sepsis, possible abdominal source  No Known Allergies  Patient Measurements: Height: 5\' 7"  (170.2 cm) Weight: 185 lb (83.915 kg) IBW/kg (Calculated) : 66.1  Vital Signs: Temp: 99.5 F (37.5 C) (03/20 0027) Temp src: Oral (03/20 0027) BP: 119/73 mmHg (03/20 0400) Pulse Rate: 108 (03/20 0400)  Labs:  Recent Labs  06/02/13 2106  WBC 14.4*  HGB 16.2  PLT 237  CREATININE 0.59   Estimated Creatinine Clearance: 108 ml/min (by C-G formula based on Cr of 0.59).  Medical History: Past Medical History  Diagnosis Date  . COPD (chronic obstructive pulmonary disease)   . Emphysema (subcutaneous) (surgical) resulting from a procedure   . Heavy cigarette smoker   . Hypertension   . HLD (hyperlipidemia)    Assessment: 58 y/o M with abdominal pain, r/o sepsis from possible abdominal source, WBC 14.4, renal function ok, afebrile, some transaminitis, other labs as above.   Goal of Therapy:  Clinical resolution   Plan:  -Zosyn 3.375G IV q8h to be infused over 4 hours -Trend WBC, temp, renal function  -F/U infectious workup  Narda Bonds 06/03/2013,6:20 AM

## 2013-06-03 NOTE — ED Notes (Signed)
Lactic Acid reported to Dr.Opitz

## 2013-06-03 NOTE — Op Note (Addendum)
[facility] [place], [id number]   ERCP PROCEDURE REPORT  PATIENT: Blake, Mcknight.  MR# :[id number] BIRTHDATE: 11/26/1955  GENDER: Male ENDOSCOPIST: Ladene Artist, MD, [name] REFERRED BY: [facility] PROCEDURE DATE:  06/03/2013 PROCEDURE:   ERCP with sphincterotomy/papillotomy and ERCP with removal of calculus/calculi ASA CLASS:   Class III INDICATIONS:abnormal liver function test .   abdominal pain in upper right quadrant.   suspected ascending cholangitis. MEDICATIONS: General endotracheal anesthesia (GETA) TOPICAL ANESTHETIC: none DESCRIPTION OF PROCEDURE:   After the risks benefits and alternatives of the procedure were thoroughly explained, informed consent was obtained.  The Pentax Ercp Scope [id number]  endoscope was introduced through the mouth  and advanced to the second portion of the duodenum . 1.  The ampulla was located in the 2nd duodenum. It was slightly prominent.  Pus was seen draining from the ampulla. 2.  There was mild dilation of the common bile duct and intrahepatic ducts. 3.  Multiple mixed pigment appearing stones measuring 3-7 mm were recovered. 4.  With guidewire in the bile duct, a biliary sphincterotomy was performed using the sphincterotome. 5. Using a stone extraction balloon the bile duct was swept several times. Multiple stones were removed from the bile duct successfully. The PD was not cannulated or injected by intention. Excellent biliary drainage after stones extracted. The scope was then completely withdrawn from the patient and the procedure terminated.   COMPLICATIONS:  ENDOSCOPIC IMPRESSION: 1.   Cholangitis, mildly prominent ampulla draining pus; sphincterotomy performed 2.   Mild dilation of the common bile duct and intrahepatic ducts. 3.   Multiple CBD stones and pus removed with extraction balloon  RECOMMENDATIONS: 1.  continue IV antibiotics 2.  trend liver enzymes 3.   surgery consult to consider cholecystectomy  eSigned:  Ladene Artist, MD, [facility] 06/03/2013 8:20 PM Revised: 06/03/2013 8:20 PM

## 2013-06-03 NOTE — Transfer of Care (Signed)
Immediate Anesthesia Transfer of Care Note  Patient: Blake Mcknight  Procedure(s) Performed: Procedure(s): ENDOSCOPIC RETROGRADE CHOLANGIOPANCREATOGRAPHY (ERCP) (N/A)  Patient Location: PACU  Anesthesia Type:General  Level of Consciousness: sedated  Airway & Oxygen Therapy: Patient Spontanous Breathing and Patient connected to face mask oxygen  Post-op Assessment: Report given to PACU RN and Post -op Vital signs reviewed and stable  Post vital signs: Reviewed and stable  Complications: No apparent anesthesia complications

## 2013-06-03 NOTE — Anesthesia Preprocedure Evaluation (Addendum)
Anesthesia Evaluation  Patient identified by MRN, date of birth, ID band Patient awake    Reviewed: Allergy & Precautions, H&P , NPO status , Patient's Chart, lab work & pertinent test results  Airway Mallampati: II TM Distance: >3 FB Neck ROM: Full    Dental  (+) Edentulous Upper, Poor Dentition, Dental Advisory Given   Pulmonary COPD COPD inhaler, Current Smoker,    + wheezing      Cardiovascular hypertension, Pt. on medications     Neuro/Psych    GI/Hepatic   Endo/Other    Renal/GU      Musculoskeletal   Abdominal   Peds  Hematology   Anesthesia Other Findings   Reproductive/Obstetrics                          Anesthesia Physical Anesthesia Plan  ASA: III  Anesthesia Plan: General   Post-op Pain Management:    Induction: Intravenous  Airway Management Planned: Oral ETT  Additional Equipment:   Intra-op Plan:   Post-operative Plan: Extubation in OR  Informed Consent: I have reviewed the patients History and Physical, chart, labs and discussed the procedure including the risks, benefits and alternatives for the proposed anesthesia with the patient or authorized representative who has indicated his/her understanding and acceptance.   Dental advisory given  Plan Discussed with: CRNA, Anesthesiologist and Surgeon  Anesthesia Plan Comments:         Anesthesia Quick Evaluation

## 2013-06-03 NOTE — Consult Note (Signed)
[facility] Consult: 2:47 PM 06/03/2013  LOS: 1 day    Referring Provider: Dr Hal Hope  Primary Care Physician:  Dr Leigh Aurora in 2003 Primary Gastroenterologist:  Althia Forts    Reason for Consultation:  Abdominal pain and elevated LFTs   HPI: Blake Mcknight is a 58 y.o. male.  Hx of COPD (severe bullous disease back in 2003/age 77), tobacco abuse, hypertension, hyperlipidemia and some unspecified mental or developmental limitations. He presented to Urgent care with abdominal pain and referred to the ER.  Transaminases in 500s, T bili 3.4, alk phos normal. Lipase normal.  WBCs 14.4 and fever to 100.1 (after initiating abx).  Mild tachy cardia to 112, BP 116/41.  Imaging includes [place], CT, CXR:  No gallstones or GB edema. CBD 8 mm, intrahepatics mildly dilated, fatty liver, severe bullous emphysema.  There is concern of alpha 1 antitrypsin deficiency given severity of lung disease at age 89.   The pain of the RUQ started 2 hours post yesterday's breakfast of egg, biscuit and molasses.  No vomiting or nausea.  No fever or chills.  Lasted several hours and relieved with Fentanyl in ED, has not recurred. Breathing is stable.  No pale stools, no itching, no dysuria or tea colored urine. One episode of similar pain 4 months ago, did not seek medical attention.  No weight loss.  No anorexia. Pt denies ETOH use. No new meds.  Never had EGD or colonoscopy.   No ETOH.   Claims he only ever smoked 10 cigs per day. Sister thinks it may have been heavier in past.     Past Medical History  Diagnosis Date  . COPD (chronic obstructive pulmonary disease)     with bullous emphysema.   Marland Kitchen Heavy cigarette smoker before 2003    pt claims only 10 cigs per day, never heavier amounts.   . Hypertension   . HLD  (hyperlipidemia)     Past Surgical History  Procedure Laterality Date  . No past surgeries      Prior to Admission medications   Medication Sig Start Date End Date Taking? Authorizing Provider  atorvastatin (LIPITOR) 10 MG tablet Take 1 tablet by mouth daily. 05/31/13  Yes Historical Provider, MD  benztropine (COGENTIN) 2 MG tablet Take 1 tablet by mouth 3 (three) times daily. 06/01/13  Yes Historical Provider, MD  cholecalciferol (VITAMIN D) 1000 UNITS tablet Take 1,000 Units by mouth daily.   Yes Historical Provider, MD  Cyanocobalamin (B-12 PO) Take 1 tablet by mouth daily.   Yes Historical Provider, MD  enalapril (VASOTEC) 10 MG tablet Take 1 tablet by mouth daily. 05/31/13  Yes Historical Provider, MD  HYDROcodone-acetaminophen (NORCO) 7.5-325 MG per tablet Take 1 tablet by mouth every 8 (eight) hours as needed. 04/11/13  Yes Historical Provider, MD  mirtazapine (REMERON) 30 MG tablet Take 1 tablet by mouth daily. 04/29/13  Yes Historical Provider, MD  Multiple Vitamins-Minerals (MULTIVITAMIN WITH MINERALS) tablet Take 1 tablet by mouth daily.   Yes Historical Provider, MD  omeprazole (PRILOSEC) 20 MG capsule Take 1 capsule  by mouth daily. 03/22/13  Yes Historical Provider, MD  PROAIR HFA 108 (90 BASE) MCG/ACT inhaler Inhale 2 puffs into the lungs 2 (two) times daily.  05/12/13  Yes Historical Provider, MD  risperidone (RISPERDAL) 4 MG tablet Take 1 tablet by mouth 2 (two) times daily. 06/02/13  Yes Historical Provider, MD  vitamin C (ASCORBIC ACID) 500 MG tablet Take 500 mg by mouth daily.   Yes Historical Provider, MD  famotidine (PEPCID) 40 MG tablet Take 1 tablet by mouth 2 (two) times daily. 04/11/13   Historical Provider, MD    Scheduled Meds: . budesonide (PULMICORT) nebulizer solution  0.25 mg Nebulization BID  . ipratropium-albuterol  3 mL Nebulization Q6H  . mirtazapine  30 mg Oral Daily  . pantoprazole  40 mg Oral Daily  . piperacillin-tazobactam (ZOSYN)  IV  3.375 g Intravenous 3  times per day  . risperidone  4 mg Oral BID  . sodium chloride  3 mL Intravenous Q12H   Infusions: . sodium chloride 500 mL (06/03/13 1302)  . sodium chloride 20 mL/hr at 06/03/13 1300   PRN Meds: albuterol, fentaNYL, hydrALAZINE, ondansetron (ZOFRAN) IV, ondansetron   Allergies as of 06/02/2013  . (No Known Allergies)    Family History  Problem Relation Age of Onset  . Diabetes Mellitus II Mother   . CAD Neg Hx   . Stroke Maternal Aunt     History   Social History  . Marital Status: Single    Spouse Name: N/A    Number of Children: N/A  . Years of Education: 10th grade.  Able to read   Occupational History  . Not on file.   Social History Main Topics  . Smoking status: Current Every Day Smoker  . Smokeless tobacco: Not on file  . Alcohol Use: no  . Drug Use: No  . Sexual Activity: Not on file   Social History Narrative  . Lives with brother Herbie [place] [phone] Sister Nicki Reaper is HCPOA [phone], [place] cousin Portia [phone]    REVIEW OF SYSTEMS: Constitutional:  As per HPI.  No weakness ENT:  No nose bleeds Pulm:  Stable cough and dyspnea CV:  No palpitations, no LE edema. No chest pain.  GU:  No hematuria, no frequency GI:  Per HPI.  No dysphagia.  No blood per rectum Heme:  No anemia  Transfusions:  None ever Neuro:  No headaches, no peripheral tingling or numbness Derm:  No itching, no rash or sores.  Endocrine:  No sweats or chills.  No polyuria or dysuria Immunization:  Not queried.  Travel:  None beyond local counties in last few months.    PHYSICAL EXAM: Vital signs in last 24 hours: Filed Vitals:   06/03/13 1441  BP:   Pulse: 99  Temp:   Resp: 20   Wt Readings from Last 3 Encounters:  06/02/13 83.915 kg (185 lb)  06/02/13 83.915 kg (185 lb)  06/02/13 83.915 kg (185 lb)   General: Lethargic AAM who looks in poor health and a bit pale Head:  Slight facial edema, symmetric faces  Eyes:  No icterus or pallor Ears:  Not HOH  Nose:   No congestion or discharge Mouth:  Clear, moist. Neck:  No mass or JVD Lungs:  Greatly diminished, upper airway wheezes Heart: RRR.  No MRG Abdomen:  Soft, thin, no mass or HSM.  No bruits.  Not tender.  BS active.   Rectal: deferred   Musc/Skeltl: no joint swelling or  contracture Extremities:  No CCE  Neurologic:  Oriented to place, self, year.  Sleepy but arouses easily.  No tremor or limb weakness Skin:  No rash. No sores, no jaundice Tattoos:  yes Nodes:  No cervical adenopathy   Psych:  Cooperative, seems reliable historian.  Not agitated.   Intake/Output from previous day: 03/19 0701 - 03/20 0700 In: 50 [I.V.:50] Out: -  Intake/Output this shift: Total I/O In: -  Out: 500 [Urine:500]  LAB RESULTS:  Recent Labs  06/02/13 2106 06/03/13 0650  WBC 14.4* 13.5*  HGB 16.2 14.6  HCT 44.8 41.7  PLT 237 191   BMET Lab Results  Component Value Date   NA 135* 06/03/2013   NA 135* 06/02/2013   K 4.3 06/03/2013   K 3.9 06/02/2013   CL 97 06/03/2013   CL 94* 06/02/2013   CO2 23 06/03/2013   CO2 25 06/02/2013   GLUCOSE 137* 06/03/2013   GLUCOSE 150* 06/02/2013   BUN 7 06/03/2013   BUN 6 06/02/2013   CREATININE 0.59 06/03/2013   CREATININE 0.59 06/02/2013   CALCIUM 8.6 06/03/2013   CALCIUM 9.5 06/02/2013   LFT  Recent Labs  06/02/13 2106 06/03/13 0650  PROT 7.9 6.6  ALBUMIN 4.0 3.2*  AST 554* 550*  ALT 349* 513*  ALKPHOS 110 115  BILITOT 2.6* 3.9*  BILIDIR  --  3.4*  IBILI  --  0.5   PT/INR Lab Results  Component Value Date   INR 0.99 06/03/2013   Hepatitis Panel Acute hepatitis panel negative.   Lipase     Component Value Date/Time   LIPASE 18 06/02/2013 2106    Drugs of Abuse  No results found for this basename: labopia,  cocainscrnur,  labbenz,  amphetmu,  thcu,  labbarb     RADIOLOGY STUDIES: Dg Chest 2 View 06/02/2013   FINDINGS: The lungs are maximally hyperinflated. There is diffuse emphysematous change, with numerous bullae. The interstitium between  the bullae is thickened. No available priors to establish stability. No cardiomegaly. No evidence of effusion or air leak. No asymmetric lung opacity to suggest consolidating pneumonia.  IMPRESSION: 1. Bullous emphysema. Due to the extensive disease and young age, alpha-1 antitrypsin deficiency should be considered. 2. Interstitial thickening which could be chronic or from interstitial edema. No cardiomegaly.   Electronically Signed   By: Jorje Guild M.D.   On: 06/02/2013 20:01   US Abdomen Complete 06/03/2013  COMPARISON:  Abdominal ultrasound 09/12/2010.  FINDINGS: Gallbladder:  No gallstones or wall thickening visualized. No sonographic Murphy sign noted.  Common bile duct:  Diameter: 0.8 cm.  Liver:  Demonstrates increased echogenicity. No focal lesion is identified. There is intrahepatic biliary ductal dilatation.  IVC:  No abnormality visualized.  Pancreas:  Visualized portion unremarkable.  Spleen:  Size and appearance within normal limits.  Right Kidney:  Length: 11.9 cm. Echogenicity within normal limits. No mass or hydronephrosis visualized.  Left Kidney:  Length: 11.9 cm. Echogenicity within normal limits. No mass or hydronephrosis visualized.  Abdominal aorta:  Not visualized.  Other findings:  None.  IMPRESSION: Intrahepatic biliary ductal dilatation. The common bile duct is mildly dilated measuring 0.8 cm. The cause for dilatation is not identified.  Fatty infiltration of the liver.  Negative for gallstones.   Electronically Signed   By: Inge Rise M.D.   On: 06/03/2013 03:55   Ct Abdomen Pelvis W Contrast 06/03/2013  FINDINGS: The lung bases demonstrate severe emphysematous change. No pleural or pericardial effusion. Heart size normal.  The liver is low attenuating consistent with fatty infiltration. There is mild, diffuse intrahepatic biliary ductal dilatation. The common bile duct is unremarkable. No air in the biliary tree is identified. No focal liver lesion or hepatic abscess is  seen. The adrenal glands, spleen, pancreas and kidneys appear normal. Aortoiliac atherosclerosis without aneurysm is noted. The stomach, small and large bowel and appendix appear normal. No lymphadenopathy or fluid is identified. There is no focal bony abnormality.  IMPRESSION: Mild intrahepatic biliary ductal dilatation is nonspecific but could be related to cholangitis. The common bile duct appears normal. Negative for hepatic abscess.  Fatty infiltration of the liver.  Severe emphysema.   Electronically Signed   By: Inge Rise M.D.   On: 06/03/2013 06:27    ENDOSCOPIC STUDIES: None ever  IMPRESSION:   *  Intrahepatic duct dilatation. Cholangitis?   *  Fatty liver.   *  COPD, severe bullous emphysema on xray.  Alpha 1 AT level ordered.    PLAN:     *  Arrange ERCP for later this afternoon. Alpha 1 AT level in process. On Zosyn so has abx coverage for procedure.  Procedure d/w pt and (over phone) with his sister, they are agreeable to proceed.   Azucena Freed  06/03/2013, 2:47 PM Pager: [phone]

## 2013-06-03 NOTE — Progress Notes (Signed)
Addendum  Patient seen and examined, chart and data base reviewed.  I agree with the above assessment and plan.  For full details please see Mrs. Imogene Burn PA note.  Likely ascending cholangitis, patient is on Zosyn.  Fever, imaging showed mild CBD dilatation, GI to evaluate.   Birdie Hopes, MD [facility] Pager: [phone] 06/03/2013, 2:22 PM

## 2013-06-03 NOTE — Consult Note (Signed)
Patient seen, examined, and I agree with the above documentation, including the assessment and plan. Probable biliary sepsis, now improved with abx.  Evidence for dilated ducts.  Plan for ERCP this afternoon with Dr. Fuller Plan We discussed ERCP. The nature of the procedure, as well as the risks, benefits, and alternatives were carefully and thoroughly reviewed with the patient. Ample time for discussion and questions allowed. The patient understood, was satisfied, and agreed to proceed.  IV abx already started

## 2013-06-03 NOTE — Anesthesia Procedure Notes (Signed)
Procedure Name: Intubation Date/Time: 06/03/2013 4:58 PM Performed by: Maude Leriche D Pre-anesthesia Checklist: Patient identified, Emergency Drugs available, Suction available, Patient being monitored and Timeout performed Patient Re-evaluated:Patient Re-evaluated prior to inductionOxygen Delivery Method: Circle system utilized Preoxygenation: Pre-oxygenation with 100% oxygen Intubation Type: IV induction Ventilation: Mask ventilation without difficulty Laryngoscope Size: Miller and 2 Grade View: Grade I Tube type: Oral Tube size: 7.5 mm Number of attempts: 1 Airway Equipment and Method: Stylet Placement Confirmation: positive ETCO2,  ETT inserted through vocal cords under direct vision and breath sounds checked- equal and bilateral Secured at: 22 cm Tube secured with: Tape Dental Injury: Teeth and Oropharynx as per pre-operative assessment

## 2013-06-03 NOTE — ED Provider Notes (Signed)
CSN: [id number]     Arrival date & time 06/02/13  2046 History   First MD Initiated Contact with Patient 06/03/13 0030     Chief Complaint  Patient presents with  . Abdominal Pain     (Consider location/radiation/quality/duration/timing/severity/associated sxs/prior Treatment) HPI History provided by patient. Right upper Quadrant abdominal pain onset earlier tonight. Evaluated at urgent care and sent here for further evaluation. Initially he denies any fevers or chills but now in the ER is beginning to feel febrile. Has nausea but no vomiting. Pain is sharp in quality and moderate in severity. No radiation of pain. No other abdominal pain. No history of same. No known history of gallstones or gallbladder disease. Is a heavy smoker with history of COPD.  Past Medical History  Diagnosis Date  . COPD (chronic obstructive pulmonary disease)   . Emphysema (subcutaneous) (surgical) resulting from a procedure   . Heavy cigarette smoker    History reviewed. No pertinent past surgical history. No family history on file. History  Substance Use Topics  . Smoking status: Current Every Day Smoker  . Smokeless tobacco: Not on file  . Alcohol Use: Yes    Review of Systems  Constitutional: Negative for fever and chills.  Respiratory: Negative for shortness of breath.   Cardiovascular: Negative for chest pain.  Gastrointestinal: Positive for abdominal pain. Negative for vomiting and diarrhea.  Genitourinary: Negative for dysuria.  Musculoskeletal: Negative for back pain.  Skin: Negative for rash.  Neurological: Negative for headaches.  All other systems reviewed and are negative.      Allergies  Review of patient's allergies indicates no known allergies.  Home Medications   Current Outpatient Rx  Name  Route  Sig  Dispense  Refill  . atorvastatin (LIPITOR) 10 MG tablet   Oral   Take 1 tablet by mouth daily.         . benztropine (COGENTIN) 2 MG tablet   Oral   Take 1 tablet  by mouth 3 (three) times daily.         . cholecalciferol (VITAMIN D) 1000 UNITS tablet   Oral   Take 1,000 Units by mouth daily.         . Cyanocobalamin (B-12 PO)   Oral   Take 1 tablet by mouth daily.         . enalapril (VASOTEC) 10 MG tablet   Oral   Take 1 tablet by mouth daily.         Marland Kitchen HYDROcodone-acetaminophen (NORCO) 7.5-325 MG per tablet   Oral   Take 1 tablet by mouth every 8 (eight) hours as needed.         . mirtazapine (REMERON) 30 MG tablet   Oral   Take 1 tablet by mouth daily.         . Multiple Vitamins-Minerals (MULTIVITAMIN WITH MINERALS) tablet   Oral   Take 1 tablet by mouth daily.         Marland Kitchen omeprazole (PRILOSEC) 20 MG capsule   Oral   Take 1 capsule by mouth daily.         Marland Kitchen PROAIR HFA 108 (90 BASE) MCG/ACT inhaler   Inhalation   Inhale 2 puffs into the lungs 2 (two) times daily.          . risperidone (RISPERDAL) 4 MG tablet   Oral   Take 1 tablet by mouth 2 (two) times daily.         . vitamin C (ASCORBIC ACID) 500  MG tablet   Oral   Take 500 mg by mouth daily.         . famotidine (PEPCID) 40 MG tablet   Oral   Take 1 tablet by mouth 2 (two) times daily.          BP 119/73  Pulse 108  Temp(Src) 99.5 F (37.5 C) (Oral)  Resp 18  Ht 5\' 7"  (1.702 m)  Wt 185 lb (83.915 kg)  BMI 28.97 kg/m2  SpO2 93% Physical Exam  Constitutional: He is oriented to person, place, and time. He appears well-developed and well-nourished.  HENT:  Head: Normocephalic and atraumatic.  Eyes: EOM are normal. Pupils are equal, round, and reactive to light.  Neck: Neck supple.  Cardiovascular: Regular rhythm and intact distal pulses.   Tachycardic  Pulmonary/Chest: Effort normal. No respiratory distress. He exhibits no tenderness.  Mild intermittent expiratory wheezes bilateral  Abdominal: Soft. Bowel sounds are normal. He exhibits no mass. There is no rebound and no guarding.  Tender right upper quadrant with negative Murphy's  sign  Musculoskeletal: Normal range of motion. He exhibits no edema.  Neurological: He is alert and oriented to person, place, and time.  Skin: Skin is warm and dry.    ED Course  Procedures (including critical care time) Labs Review Labs Reviewed  CBC WITH DIFFERENTIAL - Abnormal; Notable for the following:    WBC 14.4 (*)    MCHC 36.2 (*)    Neutrophils Relative % 89 (*)    Neutro Abs 12.8 (*)    Lymphocytes Relative 5 (*)    All other components within normal limits  COMPREHENSIVE METABOLIC PANEL - Abnormal; Notable for the following:    Sodium 135 (*)    Chloride 94 (*)    Glucose, Bld 150 (*)    AST 554 (*)    ALT 349 (*)    Total Bilirubin 2.6 (*)    All other components within normal limits  URINALYSIS, ROUTINE W REFLEX MICROSCOPIC - Abnormal; Notable for the following:    Color, Urine ORANGE (*)    Bilirubin Urine SMALL (*)    Ketones, ur 15 (*)    Urobilinogen, UA 4.0 (*)    Nitrite POSITIVE (*)    Leukocytes, UA TRACE (*)    All other components within normal limits  URINE MICROSCOPIC-ADD ON - Abnormal; Notable for the following:    Squamous Epithelial / LPF MANY (*)    Bacteria, UA MANY (*)    All other components within normal limits  CULTURE, BLOOD (ROUTINE X 2)  CULTURE, BLOOD (ROUTINE X 2)  LIPASE, BLOOD  ACETAMINOPHEN LEVEL  PROTIME-INR  HEPATITIS PANEL, ACUTE  I-STAT CG4 LACTIC ACID, ED   Imaging Review Dg Chest 2 View  06/02/2013   CLINICAL DATA:  Shortness of breath  EXAM: CHEST  2 VIEW  COMPARISON:  None. There is imaging from 2003 which is not available.  FINDINGS: The lungs are maximally hyperinflated. There is diffuse emphysematous change, with numerous bullae. The interstitium between the bullae is thickened. No available priors to establish stability. No cardiomegaly. No evidence of effusion or air leak. No asymmetric lung opacity to suggest consolidating pneumonia.  IMPRESSION: 1. Bullous emphysema. Due to the extensive disease and young age,  alpha-1 antitrypsin deficiency should be considered. 2. Interstitial thickening which could be chronic or from interstitial edema. No cardiomegaly.   Electronically Signed   By: Jorje Guild M.D.   On: 06/02/2013 20:01   US Abdomen Complete  06/03/2013  CLINICAL DATA:  Right upper quadrant pain.  EXAM: ULTRASOUND ABDOMEN COMPLETE  COMPARISON:  Abdominal ultrasound 09/12/2010.  FINDINGS: Gallbladder:  No gallstones or wall thickening visualized. No sonographic Murphy sign noted.  Common bile duct:  Diameter: 0.8 cm.  Liver:  Demonstrates increased echogenicity. No focal lesion is identified. There is intrahepatic biliary ductal dilatation.  IVC:  No abnormality visualized.  Pancreas:  Visualized portion unremarkable.  Spleen:  Size and appearance within normal limits.  Right Kidney:  Length: 11.9 cm. Echogenicity within normal limits. No mass or hydronephrosis visualized.  Left Kidney:  Length: 11.9 cm. Echogenicity within normal limits. No mass or hydronephrosis visualized.  Abdominal aorta:  Not visualized.  Other findings:  None.  IMPRESSION: Intrahepatic biliary ductal dilatation. The common bile duct is mildly dilated measuring 0.8 cm. The cause for dilatation is not identified.  Fatty infiltration of the liver.  Negative for gallstones.   Electronically Signed   By: Inge Rise M.D.   On: 06/03/2013 03:55   IV fluids. IV Zofran. IV fentanyl. IV Rocephin. Tylenol for fever Ultrasound and labs obtained, IV Zosyn initiated and PT discussed with hospitalist. Plan CT scan and admit MDM   Diagnosis: Abdominal pain  Labs obtained and reviewed as above, leukocytosis, elevated bilirubin and liver enzymes. Ultrasound reviewed as above. CT scan ordered. IV fluids, IV antibiotics, IV narcotics pain control.  MED admit    Teressa Lower, MD 06/03/13 [phone]

## 2013-06-03 NOTE — Progress Notes (Signed)
Utilization review completed.  

## 2013-06-04 ENCOUNTER — Encounter (HOSPITAL_COMMUNITY): Payer: Self-pay | Admitting: *Deleted

## 2013-06-04 DIAGNOSIS — K8309 Other cholangitis: Secondary | ICD-10-CM

## 2013-06-04 DIAGNOSIS — A419 Sepsis, unspecified organism: Secondary | ICD-10-CM

## 2013-06-04 LAB — COMPREHENSIVE METABOLIC PANEL
ALBUMIN: 2.9 g/dL — AB (ref 3.5–5.2)
ALT: 268 U/L — ABNORMAL HIGH (ref 0–53)
AST: 105 U/L — ABNORMAL HIGH (ref 0–37)
Alkaline Phosphatase: 101 U/L (ref 39–117)
BUN: 6 mg/dL (ref 6–23)
CO2: 23 mEq/L (ref 19–32)
Calcium: 8.4 mg/dL (ref 8.4–10.5)
Chloride: 99 mEq/L (ref 96–112)
Creatinine, Ser: 0.73 mg/dL (ref 0.50–1.35)
GFR calc Af Amer: >90 mL/min (ref >90)
GFR calc non Af Amer: >90 mL/min (ref >90)
Glucose, Bld: 89 mg/dL (ref 70–99)
Potassium: 4.3 mEq/L (ref 3.7–5.3)
Sodium: 135 mEq/L — ABNORMAL LOW (ref 137–147)
TOTAL PROTEIN: 6.3 g/dL (ref 6.0–8.3)
Total Bilirubin: 3.2 mg/dL — ABNORMAL HIGH (ref 0.3–1.2)

## 2013-06-04 LAB — CBC WITH DIFFERENTIAL/PLATELET
Basophils Absolute: 0 10*3/uL (ref 0.0–0.1)
Basophils Relative: 0 % (ref 0–1)
Eosinophils Absolute: 0.2 10*3/uL (ref 0.0–0.7)
Eosinophils Relative: 2 % (ref 0–5)
HCT: 38.2 % — ABNORMAL LOW (ref 39.0–52.0)
HEMOGLOBIN: 13.4 g/dL (ref 13.0–17.0)
Lymphocytes Relative: 14 % (ref 12–46)
Lymphs Abs: 1.4 10*3/uL (ref 0.7–4.0)
MCH: 30.8 pg (ref 26.0–34.0)
MCHC: 35.1 g/dL (ref 30.0–36.0)
MCV: 87.8 fL (ref 78.0–100.0)
MONO ABS: 0.7 10*3/uL (ref 0.1–1.0)
MONOS PCT: 7 % (ref 3–12)
Neutro Abs: 7.6 10*3/uL (ref 1.7–7.7)
Neutrophils Relative %: 77 % (ref 43–77)
Platelets: 178 10*3/uL (ref 150–400)
RBC: 4.35 MIL/uL (ref 4.22–5.81)
RDW: 14.4 % (ref 11.5–15.5)
WBC: 9.9 10*3/uL (ref 4.0–10.5)

## 2013-06-04 LAB — GLUCOSE, CAPILLARY
GLUCOSE-CAPILLARY: 81 mg/dL (ref 70–99)
Glucose-Capillary: 82 mg/dL (ref 70–99)
Glucose-Capillary: 83 mg/dL (ref 70–99)
Glucose-Capillary: 97 mg/dL (ref 70–99)

## 2013-06-04 MED ORDER — LACTATED RINGERS IV SOLN
INTRAVENOUS | Status: DC
  Administered 2013-06-04: 1000 mL via INTRAVENOUS
  Administered 2013-06-05 (×2): via INTRAVENOUS

## 2013-06-04 MED ORDER — PROMETHAZINE HCL 25 MG/ML IJ SOLN: 6.2500 mg | INTRAMUSCULAR | Status: DC | PRN

## 2013-06-04 NOTE — Progress Notes (Signed)
PROGRESS NOTE  Blake Mcknight VZC:[id number] DOB: [birth year] DOA: 06/02/2013 PCP: No PCP Per Patient  HPI/Subjective: 58 yo male presenting to ED on 3/19 RUQ pain and tachycardia, sent from urgent care for further evaluation. Has nausea but no vomiting. Pain is sharp in quality and moderate in severity. No known history of gallstones or gallbladder disease. Is a heavy smoker with history of COPD reporting mild dyspnea.   Assessment/Plan:  Sepsis  -Likely from biliary infection -Patient currently stable.  Acute Ascending Cholangitis - CT abdomen/ pelvis: Mild intrahepatic biliary ductal dilatation & fatty infiltration of the liver. - US abdomen: (-) gallstones, mild CBD dilation, increased echogenicity of liver - Patient has been placed on Zosyn - ERCP done with extraction of multiple CBD stones, sphincterotomy and some pus. - Gen. surgery consulted to consider cholecystectomy.  COPD  - DG chest: diffuse emphysematous change, with numerous chronic bullae - patient is wheezing on exam and has been placed on Pulmicort and scheduled nebulizers.  - If continues to wheeze may need IV steroids.  Hypertension  - Since patient is n.p.o. patient has been placed on IV hydralazine for systolic blood pressure more than 160.   Hyperlipidemia - hold statins due to elevated LFTs.  Depression  - resume risperidone and remeron.   DVT Prophylaxis:  SCDs  Code Status: full Family Communication: none at bedside  Disposition Plan: remain inpatient.    Consultants:  GI: currently NPO if need for procedure     Objective: Filed Vitals:   06/03/13 2153 06/04/13 0128 06/04/13 0447 06/04/13 0918  BP:   119/65   Pulse: 78 80 93   Temp:   98.2 F (36.8 C)   TempSrc:   Oral   Resp: 18 18 18    Height:      Weight:   75.524 kg (166 lb 8 oz)   SpO2: 98%  93% 95%    Intake/Output Summary (Last 24 hours) at 06/04/13 1103 Last data filed at 06/04/13 1045  Gross per 24 hour  Intake    1903 ml  Output    375 ml  Net   1528 ml   Filed Weights   06/02/13 2056 06/04/13 0447  Weight: 83.915 kg (185 lb) 75.524 kg (166 lb 8 oz)    Exam: General: Well-developed and moderately nourished. Lying comfortably in bed. Eyes: PERRLA, anicteric ENT: mmm Neck: No mass felt. supple Cardiovascular: S1-S2 heard. RRR Respiratory: Bilateral expiratory wheeze, no accessory muscle use. Abdomen: soft, no tenderness, guarding or rigidity. Non tender, no masses Skin: No rash.  Musculoskeletal: No edema.  Psychiatric: flat affect, 1 word appropriate answers  Neurologic: Alert awake oriented to time place and person. Moves all extremities.   Data Reviewed: Basic Metabolic Panel:  Recent Labs Lab 06/02/13 2106 06/03/13 0650  NA 135* 135*  K 3.9 4.3  CL 94* 97  CO2 25 23  GLUCOSE 150* 137*  BUN 6 7  CREATININE 0.59 0.59  CALCIUM 9.5 8.6   Liver Function Tests:  Recent Labs Lab 06/02/13 2106 06/03/13 0650  AST 554* 550*  ALT 349* 513*  ALKPHOS 110 115  BILITOT 2.6* 3.9*  PROT 7.9 6.6  ALBUMIN 4.0 3.2*    Recent Labs Lab 06/02/13 2106  LIPASE 18   CBC:  Recent Labs Lab 06/02/13 2106 06/03/13 0650  WBC 14.4* 13.5*  NEUTROABS 12.8* 12.1*  HGB 16.2 14.6  HCT 44.8 41.7  MCV 87.0 87.4  PLT 237 191   CBG:  Recent Labs Lab  06/03/13 0709 06/03/13 1156 06/04/13 0017 06/04/13 0616  GLUCAP 133* 115* 97 83     Studies: Dg Chest 2 View  06/02/2013   CLINICAL DATA:  Shortness of breath  EXAM: CHEST  2 VIEW  COMPARISON:  None. There is imaging from 2003 which is not available.  FINDINGS: The lungs are maximally hyperinflated. There is diffuse emphysematous change, with numerous bullae. The interstitium between the bullae is thickened. No available priors to establish stability. No cardiomegaly. No evidence of effusion or air leak. No asymmetric lung opacity to suggest consolidating pneumonia.  IMPRESSION: 1. Bullous emphysema. Due to the extensive disease and  young age, alpha-1 antitrypsin deficiency should be considered. 2. Interstitial thickening which could be chronic or from interstitial edema. No cardiomegaly.   Electronically Signed   By: Jorje Guild M.D.   On: 06/02/2013 20:01   US Abdomen Complete  06/03/2013   CLINICAL DATA:  Right upper quadrant pain.  EXAM: ULTRASOUND ABDOMEN COMPLETE  COMPARISON:  Abdominal ultrasound 09/12/2010.  FINDINGS: Gallbladder:  No gallstones or wall thickening visualized. No sonographic Murphy sign noted.  Common bile duct:  Diameter: 0.8 cm.  Liver:  Demonstrates increased echogenicity. No focal lesion is identified. There is intrahepatic biliary ductal dilatation.  IVC:  No abnormality visualized.  Pancreas:  Visualized portion unremarkable.  Spleen:  Size and appearance within normal limits.  Right Kidney:  Length: 11.9 cm. Echogenicity within normal limits. No mass or hydronephrosis visualized.  Left Kidney:  Length: 11.9 cm. Echogenicity within normal limits. No mass or hydronephrosis visualized.  Abdominal aorta:  Not visualized.  Other findings:  None.  IMPRESSION: Intrahepatic biliary ductal dilatation. The common bile duct is mildly dilated measuring 0.8 cm. The cause for dilatation is not identified.  Fatty infiltration of the liver.  Negative for gallstones.   Electronically Signed   By: Inge Rise M.D.   On: 06/03/2013 03:55   Ct Abdomen Pelvis W Contrast  06/03/2013   CLINICAL DATA:  Right upper quadrant abdominal pain. Sepsis. Possible ascending cholangitis.  EXAM: CT ABDOMEN AND PELVIS WITH CONTRAST  TECHNIQUE: Multidetector CT imaging of the abdomen and pelvis was performed using the standard protocol following bolus administration of intravenous contrast.  CONTRAST:  80  ML OMNIPAQUE IOHEXOL 300 MG/ML  SOLN  COMPARISON:  None.  FINDINGS: The lung bases demonstrate severe emphysematous change. No pleural or pericardial effusion. Heart size normal.  The liver is low attenuating consistent with fatty  infiltration. There is mild, diffuse intrahepatic biliary ductal dilatation. The common bile duct is unremarkable. No air in the biliary tree is identified. No focal liver lesion or hepatic abscess is seen. The adrenal glands, spleen, pancreas and kidneys appear normal. Aortoiliac atherosclerosis without aneurysm is noted. The stomach, small and large bowel and appendix appear normal. No lymphadenopathy or fluid is identified. There is no focal bony abnormality.  IMPRESSION: Mild intrahepatic biliary ductal dilatation is nonspecific but could be related to cholangitis. The common bile duct appears normal. Negative for hepatic abscess.  Fatty infiltration of the liver.  Severe emphysema.   Electronically Signed   By: Inge Rise M.D.   On: 06/03/2013 06:27   Dg Ercp Biliary & Pancreatic Ducts  06/03/2013   CLINICAL DATA:  ERCP  EXAM: ERCP with sphincterotomy  FLUOROSCOPY TIME:  3 min 39 seconds  TECHNIQUE: Multiple spot images obtained with the fluoroscopic device and submitted for interpretation post-procedure.  COMPARISON:  None.  FINDINGS: Distal end of an endoscope is visualized. There is  a wire within the common bile duct and extending into the common hepatic duct. There is contrast injected into the common bile duct without a filling defect. There is mild intrahepatic biliary ductal dilatation. There is balloon dilatation of the distal common bile duct.  IMPRESSION: ERCP with sphincterotomy.  These images were submitted for radiologic interpretation only. Please see the procedural report for the amount of contrast and the fluoroscopy time utilized.   Electronically Signed   By: Kathreen Devoid   On: 06/03/2013 18:16    Scheduled Meds: . budesonide (PULMICORT) nebulizer solution  0.25 mg Nebulization BID  . ipratropium-albuterol  3 mL Nebulization Q6H  . mirtazapine  30 mg Oral Daily  . pantoprazole  40 mg Oral Daily  . piperacillin-tazobactam (ZOSYN)  IV  3.375 g Intravenous 3 times per day  .  risperidone  4 mg Oral BID  . sodium chloride  3 mL Intravenous Q12H   Continuous Infusions: . lactated ringers 2,703 application (50/09/38 1829)    Principal Problem:   Sepsis Active Problems:   Abdominal pain   Elevated LFTs   HTN (hypertension)   HLD (hyperlipidemia)   COPD (chronic obstructive pulmonary disease)   Calculus of bile duct without mention of cholecystitis or obstruction    [facility] A [facility] Pager [phone]. If 7PM-7AM, please contact night-coverage at [email or web address], password [facility] 06/04/2013, 11:03 AM  LOS: 2 days

## 2013-06-04 NOTE — Consult Note (Addendum)
Reason for Consult: evaluation for a cholecystectomy  Referring Physician: Dr. Verlee Monte  Blake Mcknight is an 58 y.o. male.  HPI: He presented to urgent care with severe RUQ abdominal pain and was referred to  [facility] due to tachycardia.  He was found to have sepsis, white count of 13.5k, abnormal LFTs.  Abdominal US and CT of abdomen showed  intrahepatic duct dilatation and CBD dilatation, suspected cholangitis.  He was started on IV antibiotics, GI consultation and had an ERCP which confirmed cholangitis, CBD stones and pus removed.  The patient states he overall feels better since the ERCP.  He has not had oral intake, on clears today.  He denies n/v.  He has remained afebrile with stable vital signs.    White count is normal.  He is on Zosyn.  He admits to smoking 10 cigarettes per day.  He has a history of emphysema, alpha 1 antitryspin level is pending.    Past Medical History  Diagnosis Date  . COPD (chronic obstructive pulmonary disease)     with bullous emphysema.   Marland Kitchen Heavy cigarette smoker before 2003    pt claims only 10 cigs per day, never heavier amounts.   . Hypertension   . HLD (hyperlipidemia)     Past Surgical History  Procedure Laterality Date  . No past surgeries      Family History  Problem Relation Age of Onset  . Diabetes Mellitus II Mother   . CAD Neg Hx   . Stroke Maternal Aunt     Social History:  reports that he has been smoking Cigarettes.  He has a 10 pack-year smoking history. His smokeless tobacco use includes Snuff. He reports that he does not drink alcohol or use illicit drugs.  Allergies: No Known Allergies  Medications:  Scheduled Meds: . budesonide (PULMICORT) nebulizer solution  0.25 mg Nebulization BID  . ipratropium-albuterol  3 mL Nebulization Q6H  . mirtazapine  30 mg Oral Daily  . pantoprazole  40 mg Oral Daily  . piperacillin-tazobactam (ZOSYN)  IV  3.375 g Intravenous 3 times per day  . risperidone  4 mg Oral BID  . sodium  chloride  3 mL Intravenous Q12H   Continuous Infusions: . lactated ringers 2,993 application (71/69/67 [id number])   PRN Meds:.albuterol, fentaNYL, hydrALAZINE, HYDROmorphone (DILAUDID) injection, ondansetron (ZOFRAN) IV, ondansetron, promethazine  Results for orders placed during the hospital encounter of 06/02/13 (from the past 48 hour(s))  CBC WITH DIFFERENTIAL     Status: Abnormal   Collection Time    06/02/13  9:06 PM      Result Value Ref Range   WBC 14.4 (*) 4.0 - 10.5 K/uL   RBC 5.15  4.22 - 5.81 MIL/uL   Hemoglobin 16.2  13.0 - 17.0 g/dL   HCT 44.8  39.0 - 52.0 %   MCV 87.0  78.0 - 100.0 fL   MCH 31.5  26.0 - 34.0 pg   MCHC 36.2 (*) 30.0 - 36.0 g/dL   RDW 13.8  11.5 - 15.5 %   Platelets 237  150 - 400 K/uL   Neutrophils Relative % 89 (*) 43 - 77 %   Neutro Abs 12.8 (*) 1.7 - 7.7 K/uL   Lymphocytes Relative 5 (*) 12 - 46 %   Lymphs Abs 0.7  0.7 - 4.0 K/uL   Monocytes Relative 6  3 - 12 %   Monocytes Absolute 0.8  0.1 - 1.0 K/uL   Eosinophils Relative 0  0 - 5 %  Eosinophils Absolute 0.0  0.0 - 0.7 K/uL   Basophils Relative 0  0 - 1 %   Basophils Absolute 0.0  0.0 - 0.1 K/uL  COMPREHENSIVE METABOLIC PANEL     Status: Abnormal   Collection Time    06/02/13  9:06 PM      Result Value Ref Range   Sodium 135 (*) 137 - 147 mEq/L   Potassium 3.9  3.7 - 5.3 mEq/L   Chloride 94 (*) 96 - 112 mEq/L   CO2 25  19 - 32 mEq/L   Glucose, Bld 150 (*) 70 - 99 mg/dL   BUN 6  6 - 23 mg/dL   Creatinine, Ser 0.59  0.50 - 1.35 mg/dL   Calcium 9.5  8.4 - 10.5 mg/dL   Total Protein 7.9  6.0 - 8.3 g/dL   Albumin 4.0  3.5 - 5.2 g/dL   AST 554 (*) 0 - 37 U/L   ALT 349 (*) 0 - 53 U/L   Alkaline Phosphatase 110  39 - 117 U/L   Total Bilirubin 2.6 (*) 0.3 - 1.2 mg/dL   GFR calc non Af Amer >90  >90 mL/min   GFR calc Af Amer >90  >90 mL/min   Comment: (NOTE)     The eGFR has been calculated using the CKD EPI equation.     This calculation has not been validated in all clinical situations.      eGFR's persistently <90 mL/min signify possible Chronic Kidney     Disease.  LIPASE, BLOOD     Status: None   Collection Time    06/02/13  9:06 PM      Result Value Ref Range   Lipase 18  11 - 59 U/L  URINALYSIS, ROUTINE W REFLEX MICROSCOPIC     Status: Abnormal   Collection Time    06/02/13  9:25 PM      Result Value Ref Range   Color, Urine ORANGE (*) YELLOW   Comment: BIOCHEMICALS MAY BE AFFECTED BY COLOR   APPearance CLEAR  CLEAR   Specific Gravity, Urine 1.021  1.005 - 1.030   pH 5.5  5.0 - 8.0   Glucose, UA NEGATIVE  NEGATIVE mg/dL   Hgb urine dipstick NEGATIVE  NEGATIVE   Bilirubin Urine SMALL (*) NEGATIVE   Ketones, ur 15 (*) NEGATIVE mg/dL   Protein, ur NEGATIVE  NEGATIVE mg/dL   Urobilinogen, UA 4.0 (*) 0.0 - 1.0 mg/dL   Nitrite POSITIVE (*) NEGATIVE   Leukocytes, UA TRACE (*) NEGATIVE  URINE MICROSCOPIC-ADD ON     Status: Abnormal   Collection Time    06/02/13  9:25 PM      Result Value Ref Range   Squamous Epithelial / LPF MANY (*) RARE   WBC, UA 0-2  <3 WBC/hpf   Bacteria, UA MANY (*) RARE  ACETAMINOPHEN LEVEL     Status: None   Collection Time    06/03/13  5:15 AM      Result Value Ref Range   Acetaminophen (Tylenol), Serum <15.0  10 - 30 ug/mL   Comment:            THERAPEUTIC CONCENTRATIONS VARY     SIGNIFICANTLY. A RANGE OF 10-30     ug/mL MAY BE AN EFFECTIVE     CONCENTRATION FOR MANY PATIENTS.     HOWEVER, SOME ARE BEST TREATED     AT CONCENTRATIONS OUTSIDE THIS     RANGE.     ACETAMINOPHEN CONCENTRATIONS     >  150 ug/mL AT 4 HOURS AFTER     INGESTION AND >50 ug/mL AT 12     HOURS AFTER INGESTION ARE     OFTEN ASSOCIATED WITH TOXIC     REACTIONS.  PROTIME-INR     Status: None   Collection Time    06/03/13  5:15 AM      Result Value Ref Range   Prothrombin Time 12.9  11.6 - 15.2 seconds   INR 0.99  0.00 - 1.49  HEPATITIS PANEL, ACUTE     Status: None   Collection Time    06/03/13  5:15 AM      Result Value Ref Range   Hepatitis B Surface  Ag NEGATIVE  NEGATIVE   HCV Ab NEGATIVE  NEGATIVE   Hep A IgM NON REACTIVE  NON REACTIVE   Hep B C IgM NON REACTIVE  NON REACTIVE   Comment: (NOTE)     High levels of Hepatitis B Core IgM antibody are detectable     during the acute stage of Hepatitis B. This antibody is used     to differentiate current from past HBV infection.     Performed at [facility], ED     Status: Abnormal   Collection Time    06/03/13  5:50 AM      Result Value Ref Range   Lactic Acid, Venous 2.54 (*) 0.5 - 2.2 mmol/L  BASIC METABOLIC PANEL     Status: Abnormal   Collection Time    06/03/13  6:50 AM      Result Value Ref Range   Sodium 135 (*) 137 - 147 mEq/L   Potassium 4.3  3.7 - 5.3 mEq/L   Chloride 97  96 - 112 mEq/L   CO2 23  19 - 32 mEq/L   Glucose, Bld 137 (*) 70 - 99 mg/dL   BUN 7  6 - 23 mg/dL   Creatinine, Ser 0.59  0.50 - 1.35 mg/dL   Calcium 8.6  8.4 - 10.5 mg/dL   GFR calc non Af Amer >90  >90 mL/min   GFR calc Af Amer >90  >90 mL/min   Comment: (NOTE)     The eGFR has been calculated using the CKD EPI equation.     This calculation has not been validated in all clinical situations.     eGFR's persistently <90 mL/min signify possible Chronic Kidney     Disease.  CBC WITH DIFFERENTIAL     Status: Abnormal   Collection Time    06/03/13  6:50 AM      Result Value Ref Range   WBC 13.5 (*) 4.0 - 10.5 K/uL   RBC 4.77  4.22 - 5.81 MIL/uL   Hemoglobin 14.6  13.0 - 17.0 g/dL   HCT 41.7  39.0 - 52.0 %   MCV 87.4  78.0 - 100.0 fL   MCH 30.6  26.0 - 34.0 pg   MCHC 35.0  30.0 - 36.0 g/dL   RDW 13.8  11.5 - 15.5 %   Platelets 191  150 - 400 K/uL   Neutrophils Relative % 89 (*) 43 - 77 %   Neutro Abs 12.1 (*) 1.7 - 7.7 K/uL   Lymphocytes Relative 5 (*) 12 - 46 %   Lymphs Abs 0.6 (*) 0.7 - 4.0 K/uL   Monocytes Relative 6  3 - 12 %   Monocytes Absolute 0.8  0.1 - 1.0 K/uL   Eosinophils Relative 0  0 -  5 %   Eosinophils Absolute 0.0  0.0 - 0.7 K/uL    Basophils Relative 0  0 - 1 %   Basophils Absolute 0.0  0.0 - 0.1 K/uL  HEPATIC FUNCTION PANEL     Status: Abnormal   Collection Time    06/03/13  6:50 AM      Result Value Ref Range   Total Protein 6.6  6.0 - 8.3 g/dL   Albumin 3.2 (*) 3.5 - 5.2 g/dL   AST 550 (*) 0 - 37 U/L   ALT 513 (*) 0 - 53 U/L   Alkaline Phosphatase 115  39 - 117 U/L   Total Bilirubin 3.9 (*) 0.3 - 1.2 mg/dL   Bilirubin, Direct 3.4 (*) 0.0 - 0.3 mg/dL   Indirect Bilirubin 0.5  0.3 - 0.9 mg/dL  GLUCOSE, CAPILLARY     Status: Abnormal   Collection Time    06/03/13  7:09 AM      Result Value Ref Range   Glucose-Capillary 133 (*) 70 - 99 mg/dL  GLUCOSE, CAPILLARY     Status: Abnormal   Collection Time    06/03/13 11:56 AM      Result Value Ref Range   Glucose-Capillary 115 (*) 70 - 99 mg/dL  GLUCOSE, CAPILLARY     Status: None   Collection Time    06/04/13 12:17 AM      Result Value Ref Range   Glucose-Capillary 97  70 - 99 mg/dL  GLUCOSE, CAPILLARY     Status: None   Collection Time    06/04/13  6:16 AM      Result Value Ref Range   Glucose-Capillary 83  70 - 99 mg/dL  CBC WITH DIFFERENTIAL     Status: Abnormal   Collection Time    06/04/13 10:39 AM      Result Value Ref Range   WBC 9.9  4.0 - 10.5 K/uL   RBC 4.35  4.22 - 5.81 MIL/uL   Hemoglobin 13.4  13.0 - 17.0 g/dL   HCT 38.2 (*) 39.0 - 52.0 %   MCV 87.8  78.0 - 100.0 fL   MCH 30.8  26.0 - 34.0 pg   MCHC 35.1  30.0 - 36.0 g/dL   RDW 14.4  11.5 - 15.5 %   Platelets 178  150 - 400 K/uL   Neutrophils Relative % 77  43 - 77 %   Neutro Abs 7.6  1.7 - 7.7 K/uL   Lymphocytes Relative 14  12 - 46 %   Lymphs Abs 1.4  0.7 - 4.0 K/uL   Monocytes Relative 7  3 - 12 %   Monocytes Absolute 0.7  0.1 - 1.0 K/uL   Eosinophils Relative 2  0 - 5 %   Eosinophils Absolute 0.2  0.0 - 0.7 K/uL   Basophils Relative 0  0 - 1 %   Basophils Absolute 0.0  0.0 - 0.1 K/uL  COMPREHENSIVE METABOLIC PANEL     Status: Abnormal   Collection Time    06/04/13 10:39 AM       Result Value Ref Range   Sodium 135 (*) 137 - 147 mEq/L   Potassium 4.3  3.7 - 5.3 mEq/L   Chloride 99  96 - 112 mEq/L   CO2 23  19 - 32 mEq/L   Glucose, Bld 89  70 - 99 mg/dL   BUN 6  6 - 23 mg/dL   Creatinine, Ser 0.73  0.50 - 1.35 mg/dL   Calcium 8.4  8.4 - 10.5 mg/dL   Total Protein 6.3  6.0 - 8.3 g/dL   Albumin 2.9 (*) 3.5 - 5.2 g/dL   AST 105 (*) 0 - 37 U/L   ALT 268 (*) 0 - 53 U/L   Alkaline Phosphatase 101  39 - 117 U/L   Total Bilirubin 3.2 (*) 0.3 - 1.2 mg/dL   GFR calc non Af Amer >90  >90 mL/min   GFR calc Af Amer >90  >90 mL/min   Comment: (NOTE)     The eGFR has been calculated using the CKD EPI equation.     This calculation has not been validated in all clinical situations.     eGFR's persistently <90 mL/min signify possible Chronic Kidney     Disease.  GLUCOSE, CAPILLARY     Status: None   Collection Time    06/04/13 11:29 AM      Result Value Ref Range   Glucose-Capillary 82  70 - 99 mg/dL    Dg Chest 2 View  06/02/2013   CLINICAL DATA:  Shortness of breath  EXAM: CHEST  2 VIEW  COMPARISON:  None. There is imaging from 2003 which is not available.  FINDINGS: The lungs are maximally hyperinflated. There is diffuse emphysematous change, with numerous bullae. The interstitium between the bullae is thickened. No available priors to establish stability. No cardiomegaly. No evidence of effusion or air leak. No asymmetric lung opacity to suggest consolidating pneumonia.  IMPRESSION: 1. Bullous emphysema. Due to the extensive disease and young age, alpha-1 antitrypsin deficiency should be considered. 2. Interstitial thickening which could be chronic or from interstitial edema. No cardiomegaly.   Electronically Signed   By: Jorje Guild M.D.   On: 06/02/2013 20:01   US Abdomen Complete  06/03/2013   CLINICAL DATA:  Right upper quadrant pain.  EXAM: ULTRASOUND ABDOMEN COMPLETE  COMPARISON:  Abdominal ultrasound 09/12/2010.  FINDINGS: Gallbladder:  No gallstones or  wall thickening visualized. No sonographic Murphy sign noted.  Common bile duct:  Diameter: 0.8 cm.  Liver:  Demonstrates increased echogenicity. No focal lesion is identified. There is intrahepatic biliary ductal dilatation.  IVC:  No abnormality visualized.  Pancreas:  Visualized portion unremarkable.  Spleen:  Size and appearance within normal limits.  Right Kidney:  Length: 11.9 cm. Echogenicity within normal limits. No mass or hydronephrosis visualized.  Left Kidney:  Length: 11.9 cm. Echogenicity within normal limits. No mass or hydronephrosis visualized.  Abdominal aorta:  Not visualized.  Other findings:  None.  IMPRESSION: Intrahepatic biliary ductal dilatation. The common bile duct is mildly dilated measuring 0.8 cm. The cause for dilatation is not identified.  Fatty infiltration of the liver.  Negative for gallstones.   Electronically Signed   By: Inge Rise M.D.   On: 06/03/2013 03:55   Ct Abdomen Pelvis W Contrast  06/03/2013   CLINICAL DATA:  Right upper quadrant abdominal pain. Sepsis. Possible ascending cholangitis.  EXAM: CT ABDOMEN AND PELVIS WITH CONTRAST  TECHNIQUE: Multidetector CT imaging of the abdomen and pelvis was performed using the standard protocol following bolus administration of intravenous contrast.  CONTRAST:  80  ML OMNIPAQUE IOHEXOL 300 MG/ML  SOLN  COMPARISON:  None.  FINDINGS: The lung bases demonstrate severe emphysematous change. No pleural or pericardial effusion. Heart size normal.  The liver is low attenuating consistent with fatty infiltration. There is mild, diffuse intrahepatic biliary ductal dilatation. The common bile duct is unremarkable. No air in the biliary tree is identified. No focal liver lesion  or hepatic abscess is seen. The adrenal glands, spleen, pancreas and kidneys appear normal. Aortoiliac atherosclerosis without aneurysm is noted. The stomach, small and large bowel and appendix appear normal. No lymphadenopathy or fluid is identified. There is  no focal bony abnormality.  IMPRESSION: Mild intrahepatic biliary ductal dilatation is nonspecific but could be related to cholangitis. The common bile duct appears normal. Negative for hepatic abscess.  Fatty infiltration of the liver.  Severe emphysema.   Electronically Signed   By: Inge Rise M.D.   On: 06/03/2013 06:27   Dg Ercp Biliary & Pancreatic Ducts  06/03/2013   CLINICAL DATA:  ERCP  EXAM: ERCP with sphincterotomy  FLUOROSCOPY TIME:  3 min 39 seconds  TECHNIQUE: Multiple spot images obtained with the fluoroscopic device and submitted for interpretation post-procedure.  COMPARISON:  None.  FINDINGS: Distal end of an endoscope is visualized. There is a wire within the common bile duct and extending into the common hepatic duct. There is contrast injected into the common bile duct without a filling defect. There is mild intrahepatic biliary ductal dilatation. There is balloon dilatation of the distal common bile duct.  IMPRESSION: ERCP with sphincterotomy.  These images were submitted for radiologic interpretation only. Please see the procedural report for the amount of contrast and the fluoroscopy time utilized.   Electronically Signed   By: Kathreen Devoid   On: 06/03/2013 18:16    Review of Systems  Constitutional: Negative.   HENT: Negative.   Eyes: Negative.   Respiratory: Negative for hemoptysis and shortness of breath.   Cardiovascular: Negative for chest pain, palpitations and leg swelling.  Gastrointestinal: Negative.   Genitourinary: Negative.   Musculoskeletal: Negative.   Skin: Negative.   Neurological: Negative.   Endo/Heme/Allergies: Negative.   Psychiatric/Behavioral: Negative.    Blood pressure 119/65, pulse 93, temperature 98.2 F (36.8 C), temperature source Oral, resp. rate 18, height '5\' 7"'  (1.702 m), weight 166 lb 8 oz (75.524 kg), SpO2 95.00%. Physical Exam  Constitutional: He is oriented to person, place, and time. He appears well-developed and well-nourished.  No distress.  HENT:  Head: Normocephalic and atraumatic.  Neck: Normal range of motion. Neck supple.  Cardiovascular: Normal rate, regular rhythm, normal heart sounds and intact distal pulses.  Exam reveals no gallop and no friction rub.   No murmur heard. Respiratory: Effort normal and breath sounds normal. No respiratory distress. He has no wheezes. He has no rales. He exhibits no tenderness.  GI: Soft. Bowel sounds are normal. He exhibits no distension and no mass. There is no rebound and no guarding.  TTP to RUQ  Musculoskeletal: Normal range of motion. He exhibits no edema and no tenderness.  Neurological: He is alert and oriented to person, place, and time.  Skin: Skin is warm and dry. No rash noted. He is not diaphoretic. No erythema. No pallor.  Psychiatric: He has a normal mood and affect. His behavior is normal. Judgment and thought content normal.    Assessment/Plan: COPD HTN Sepsis Acute cholangitis   tentatively plan for a laparoscopic cholecystectomy tomorrow or Monday morning pending OR schedule.   NPO after midnight Continue with antibiotics Hold anticoagulation pre-op   agree with above, family would like medicine to clear with copd, I think he needs lap chole today/monday pending this.  Discussed surgery and indications with patient and family.  He has no gallstones on imaging but clearly has stones on his ercp with cholangitis   RIEBOCK, EMINA ANP-BC 06/04/2013, 12:15 PM

## 2013-06-04 NOTE — Progress Notes (Signed)
Progress Note   Subjective  Much improved. Less RUQ pain. More alert.    Objective  Vital signs in last 24 hours: Temp:  [98.2 F (36.8 C)-99.4 F (37.4 C)] 98.2 F (36.8 C) (03/21 0447) Pulse Rate:  [78-120] 93 (03/21 0447) Resp:  [16-22] 18 (03/21 0447) BP: (105-129)/(58-79) 119/65 mmHg (03/21 0447) SpO2:  [91 %-100 %] 95 % (03/21 0918) Weight:  [166 lb 8 oz (75.524 kg)] 166 lb 8 oz (75.524 kg) (03/21 0447) Last BM Date:  (prior to admission- pt unsure)   General:   Alert, well-developed, male in NAD Heart:  Regular rate and rhythm; no murmurs Abdomen:  Soft, mild RUQ tenderness and nondistended. Normal bowel sounds, without guarding, and without rebound.   Extremities:  Without edema. Neurologic:  Alert and  oriented x4;  grossly normal neurologically. Psych:  Alert and cooperative. Normal mood and affect.  Intake/Output from previous day: 03/20 0701 - 03/21 0700 In: 1900 [I.V.:1850; IV Piggyback:50] Out: 725 [Urine:675] Intake/Output this shift:    Lab Results:  Recent Labs  06/02/13 2106 06/03/13 0650  WBC 14.4* 13.5*  HGB 16.2 14.6  HCT 44.8 41.7  PLT 237 191   BMET  Recent Labs  06/02/13 2106 06/03/13 0650  NA 135* 135*  K 3.9 4.3  CL 94* 97  CO2 25 23  GLUCOSE 150* 137*  BUN 6 7  CREATININE 0.59 0.59  CALCIUM 9.5 8.6   LFT  Recent Labs  06/03/13 0650  PROT 6.6  ALBUMIN 3.2*  AST 550*  ALT 513*  ALKPHOS 115  BILITOT 3.9*  BILIDIR 3.4*  IBILI 0.5   PT/INR  Recent Labs  06/03/13 0515  LABPROT 12.9  INR 0.99   Hepatitis Panel  Recent Labs  06/03/13 0515  HEPBSAG NEGATIVE  HCVAB NEGATIVE  HEPAIGM NON REACTIVE  HEPBIGM NON REACTIVE    Studies/Results: Dg Chest 2 View  06/02/2013   CLINICAL DATA:  Shortness of breath  EXAM: CHEST  2 VIEW  COMPARISON:  None. There is imaging from 2003 which is not available.  FINDINGS: The lungs are maximally hyperinflated. There is diffuse emphysematous change, with numerous bullae.  The interstitium between the bullae is thickened. No available priors to establish stability. No cardiomegaly. No evidence of effusion or air leak. No asymmetric lung opacity to suggest consolidating pneumonia.  IMPRESSION: 1. Bullous emphysema. Due to the extensive disease and young age, alpha-1 antitrypsin deficiency should be considered. 2. Interstitial thickening which could be chronic or from interstitial edema. No cardiomegaly.   Electronically Signed   By: Jorje Guild M.D.   On: 06/02/2013 20:01   US Abdomen Complete  06/03/2013   CLINICAL DATA:  Right upper quadrant pain.  EXAM: ULTRASOUND ABDOMEN COMPLETE  COMPARISON:  Abdominal ultrasound 09/12/2010.  FINDINGS: Gallbladder:  No gallstones or wall thickening visualized. No sonographic Murphy sign noted.  Common bile duct:  Diameter: 0.8 cm.  Liver:  Demonstrates increased echogenicity. No focal lesion is identified. There is intrahepatic biliary ductal dilatation.  IVC:  No abnormality visualized.  Pancreas:  Visualized portion unremarkable.  Spleen:  Size and appearance within normal limits.  Right Kidney:  Length: 11.9 cm. Echogenicity within normal limits. No mass or hydronephrosis visualized.  Left Kidney:  Length: 11.9 cm. Echogenicity within normal limits. No mass or hydronephrosis visualized.  Abdominal aorta:  Not visualized.  Other findings:  None.  IMPRESSION: Intrahepatic biliary ductal dilatation. The common bile duct is mildly dilated measuring 0.8 cm. The cause for  dilatation is not identified.  Fatty infiltration of the liver.  Negative for gallstones.   Electronically Signed   By: Inge Rise M.D.   On: 06/03/2013 03:55   Ct Abdomen Pelvis W Contrast  06/03/2013   CLINICAL DATA:  Right upper quadrant abdominal pain. Sepsis. Possible ascending cholangitis.  EXAM: CT ABDOMEN AND PELVIS WITH CONTRAST  TECHNIQUE: Multidetector CT imaging of the abdomen and pelvis was performed using the standard protocol following bolus  administration of intravenous contrast.  CONTRAST:  80  ML OMNIPAQUE IOHEXOL 300 MG/ML  SOLN  COMPARISON:  None.  FINDINGS: The lung bases demonstrate severe emphysematous change. No pleural or pericardial effusion. Heart size normal.  The liver is low attenuating consistent with fatty infiltration. There is mild, diffuse intrahepatic biliary ductal dilatation. The common bile duct is unremarkable. No air in the biliary tree is identified. No focal liver lesion or hepatic abscess is seen. The adrenal glands, spleen, pancreas and kidneys appear normal. Aortoiliac atherosclerosis without aneurysm is noted. The stomach, small and large bowel and appendix appear normal. No lymphadenopathy or fluid is identified. There is no focal bony abnormality.  IMPRESSION: Mild intrahepatic biliary ductal dilatation is nonspecific but could be related to cholangitis. The common bile duct appears normal. Negative for hepatic abscess.  Fatty infiltration of the liver.  Severe emphysema.   Electronically Signed   By: Inge Rise M.D.   On: 06/03/2013 06:27   Dg Ercp Biliary & Pancreatic Ducts  06/03/2013   CLINICAL DATA:  ERCP  EXAM: ERCP with sphincterotomy  FLUOROSCOPY TIME:  3 min 39 seconds  TECHNIQUE: Multiple spot images obtained with the fluoroscopic device and submitted for interpretation post-procedure.  COMPARISON:  None.  FINDINGS: Distal end of an endoscope is visualized. There is a wire within the common bile duct and extending into the common hepatic duct. There is contrast injected into the common bile duct without a filling defect. There is mild intrahepatic biliary ductal dilatation. There is balloon dilatation of the distal common bile duct.  IMPRESSION: ERCP with sphincterotomy.  These images were submitted for radiologic interpretation only. Please see the procedural report for the amount of contrast and the fluoroscopy time utilized.   Electronically Signed   By: Kathreen Devoid   On: 06/03/2013 18:16       Assessment & Plan   1. Cholangitis, choledocholithiasis. S/P ERCP with sphincterotomy and stone extraction yesterday. Markedly improved today. Advance diet as tolerated, starting with clears. Continue IV antibiotics. Trend LFTs, CBC. Surgical consult to consider cholecystectomy.     Principal Problem:   Sepsis Active Problems:   Abdominal pain   Elevated LFTs   HTN (hypertension)   HLD (hyperlipidemia)   COPD (chronic obstructive pulmonary disease)   Calculus of bile duct without mention of cholecystitis or obstruction    LOS: 2 days   Stellah Donovan T. Fuller Plan MD  06/04/2013, 9:55 AM

## 2013-06-05 DIAGNOSIS — F209 Schizophrenia, unspecified: Secondary | ICD-10-CM

## 2013-06-05 DIAGNOSIS — J439 Emphysema, unspecified: Secondary | ICD-10-CM

## 2013-06-05 DIAGNOSIS — K8050 Calculus of bile duct without cholangitis or cholecystitis without obstruction: Secondary | ICD-10-CM

## 2013-06-05 DIAGNOSIS — I10 Essential (primary) hypertension: Secondary | ICD-10-CM

## 2013-06-05 DIAGNOSIS — E785 Hyperlipidemia, unspecified: Secondary | ICD-10-CM

## 2013-06-05 DIAGNOSIS — J449 Chronic obstructive pulmonary disease, unspecified: Secondary | ICD-10-CM

## 2013-06-05 LAB — COMPREHENSIVE METABOLIC PANEL
ALBUMIN: 3.1 g/dL — AB (ref 3.5–5.2)
ALK PHOS: 102 U/L (ref 39–117)
ALT: 204 U/L — AB (ref 0–53)
AST: 51 U/L — ABNORMAL HIGH (ref 0–37)
BUN: 4 mg/dL — ABNORMAL LOW (ref 6–23)
CO2: 22 mEq/L (ref 19–32)
Calcium: 9.2 mg/dL (ref 8.4–10.5)
Chloride: 99 mEq/L (ref 96–112)
Creatinine, Ser: 0.57 mg/dL (ref 0.50–1.35)
GFR calc Af Amer: >90 mL/min (ref >90)
GFR calc non Af Amer: >90 mL/min (ref >90)
Glucose, Bld: 98 mg/dL (ref 70–99)
POTASSIUM: 4 meq/L (ref 3.7–5.3)
SODIUM: 137 meq/L (ref 137–147)
Total Bilirubin: 1.8 mg/dL — ABNORMAL HIGH (ref 0.3–1.2)
Total Protein: 6.9 g/dL (ref 6.0–8.3)

## 2013-06-05 LAB — CBC
HEMATOCRIT: 39.7 % (ref 39.0–52.0)
Hemoglobin: 14.1 g/dL (ref 13.0–17.0)
MCH: 30.9 pg (ref 26.0–34.0)
MCHC: 35.5 g/dL (ref 30.0–36.0)
MCV: 87.1 fL (ref 78.0–100.0)
Platelets: 198 10*3/uL (ref 150–400)
RBC: 4.56 MIL/uL (ref 4.22–5.81)
RDW: 14.3 % (ref 11.5–15.5)
WBC: 7.6 10*3/uL (ref 4.0–10.5)

## 2013-06-05 LAB — LACTIC ACID, PLASMA: Lactic Acid, Venous: 1.6 mmol/L (ref 0.5–2.2)

## 2013-06-05 LAB — GLUCOSE, CAPILLARY
GLUCOSE-CAPILLARY: 100 mg/dL — AB (ref 70–99)
GLUCOSE-CAPILLARY: 99 mg/dL (ref 70–99)
Glucose-Capillary: 90 mg/dL (ref 70–99)

## 2013-06-05 LAB — SURGICAL PCR SCREEN
MRSA, PCR: NEGATIVE
STAPHYLOCOCCUS AUREUS: NEGATIVE

## 2013-06-05 MED ORDER — ENOXAPARIN SODIUM 40 MG/0.4ML ~~LOC~~ SOLN
40.0000 mg | SUBCUTANEOUS | Status: DC
  Administered 2013-06-05 – 2013-06-07 (×3): 40 mg via SUBCUTANEOUS
  Filled 2013-06-05 (×4): qty 0.4

## 2013-06-05 NOTE — Consult Note (Signed)
PULMONARY / CRITICAL CARE MEDICINE  Name: Blake Mcknight MRN: [id number] DOB: Feb 03, 1956    ADMISSION DATE:  06/02/2013 CONSULTATION DATE:  06/05/13  REFERRING MD :  Hartford Poli- [facility] PR[facility] SERVICE:  [facility] PCP: Katherine Roan  REASON FOR CONSULT:  SOB, evaluate COPD, current smoker...  BRIEF PATIENT DESCRIPTION:  58 y/o BM, regular pt of Dr. [name], w/ hx Bullous emphysema, regular 1/2ppd smoker, HBP, HL, peptic ulcer dis, and schizophrenia;  Presented to the ER w/ acute RUQ abd pain and found to have cholangitis and common bile duct stones;  Now s/p ERCP, on Zosyn, and surg plans LapChole... Pulm asked to consult for perioperative management of his COPD...  SIGNIFICANT EVENTS / STUDIES:  3/19> presented to ER w/ abd pain; eval revealed CBD stones, elev LFTs, etc... 3/20> s/p ERCP by DrStark w/ pus from the ampulla, balloon extraction & sphincterotomy 3/21> LFTs improving, consult drWakefield for GB surg... 3/22> consult pulm for perioperative management  LINES / TUBES:  CULTURES:  ANTIBIOTICS: 3/20> ZOSYN=>   HISTORY OF PRESENT ILLNESS:  58 y/o BM, regular pt of Dr. [name], w/ hx Bullous emphysema, regular 1/2ppd smoker, HBP, HL, peptic ulcer dis, and schizophrenia;  Presented to the ER w/ acute RUQ abd pain and found to have cholangitis and common bile duct stones;  Now s/p ERCP, on Zosyn, and surg plans LapChole... Pulm asked to consult for perioperative management of his COPD...  COPD/ bullous emphysema>> no old records avail, hx from pt & his sister at bedside; long smoking hx starting in his teens=> present for a 26 yr hx smoking, max 1ppd, but decr to 1/2ppd over the last few yrs; they state NEG FamHx of any lung problems in parents or siblings; he worked for [employer] in the [employer] but I could not quite understand his explanation of exposures; his LMD is DrKilpatrick who sees him every 59mo or so, no records avail of prev evaluations, PFTs, A1AT level,  etc; old CXR report in [place] from 2003 showed diffuse severe bullous emphysema at that time and CT Chest 2003 confirmed severe bullous dis & blebs bilat (no adenopathy, masses, or effusions); his only current medication is Proair inhaler for prn use; pt notes DOE/ SOB/ wheezing/ dry cough- SOB w/ ADLs x yrs (eg- waking around the house) and he is quite sedentary...  PAST MEDICAL HISTORY :  Past Medical History  Diagnosis Date  . COPD (chronic obstructive pulmonary disease)     with bullous emphysema.   Marland Kitchen Heavy cigarette smoker before 2003    pt claims only 10 cigs per day, never heavier amounts.   . Hypertension   . HLD (hyperlipidemia)    Past Surgical History  Procedure Laterality Date  . No past surgeries     Prior to Admission medications   Medication Sig Start Date End Date Taking? Authorizing Provider  atorvastatin (LIPITOR) 10 MG tablet Take 1 tablet by mouth daily. 05/31/13  Yes Historical Provider, MD  benztropine (COGENTIN) 2 MG tablet Take 1 tablet by mouth 3 (three) times daily. 06/01/13  Yes Historical Provider, MD  cholecalciferol (VITAMIN D) 1000 UNITS tablet Take 1,000 Units by mouth daily.   Yes Historical Provider, MD  Cyanocobalamin (B-12 PO) Take 1 tablet by mouth daily.   Yes Historical Provider, MD  enalapril (VASOTEC) 10 MG tablet Take 1 tablet by mouth daily. 05/31/13  Yes Historical Provider, MD  HYDROcodone-acetaminophen (NORCO) 7.5-325 MG per tablet Take 1 tablet by mouth every 8 (eight) hours as needed.  04/11/13  Yes Historical Provider, MD  mirtazapine (REMERON) 30 MG tablet Take 1 tablet by mouth daily. 04/29/13  Yes Historical Provider, MD  Multiple Vitamins-Minerals (MULTIVITAMIN WITH MINERALS) tablet Take 1 tablet by mouth daily.   Yes Historical Provider, MD  omeprazole (PRILOSEC) 20 MG capsule Take 1 capsule by mouth daily. 03/22/13  Yes Historical Provider, MD  PROAIR HFA 108 (90 BASE) MCG/ACT inhaler Inhale 2 puffs into the lungs 2 (two) times daily.  05/12/13   Yes Historical Provider, MD  risperidone (RISPERDAL) 4 MG tablet Take 1 tablet by mouth 2 (two) times daily. 06/02/13  Yes Historical Provider, MD  vitamin C (ASCORBIC ACID) 500 MG tablet Take 500 mg by mouth daily.   Yes Historical Provider, MD  famotidine (PEPCID) 40 MG tablet Take 1 tablet by mouth 2 (two) times daily. 04/11/13   Historical Provider, MD   No Known Allergies  FAMILY HISTORY:  Family History  Problem Relation Age of Onset  . Diabetes Mellitus II Mother   . CAD Neg Hx   . Stroke Maternal Aunt    SOCIAL HISTORY:  reports that he has been smoking Cigarettes.  He has a 10 pack-year smoking history. His smokeless tobacco use includes Snuff. He reports that he does not drink alcohol or use illicit drugs. He reports to me that he started smoking cigarettes in his teens, never more than 1ppd, and decr to 1/2ppd over the last few yrs=> therefore <40 pack-yr hx smoking...  REVIEW OF SYSTEMS:   Constitutional:  Denies F/C/S, anorexia, unexpected weight change. HEENT:  No HA, visual changes, earache, nasal symptoms, sore throat, hoarseness. Resp:  +cough, clear sputum, no hemoptysis; +SOB, +chest tightness, +wheezing. Cardio:  No CP, no palpit, +DOE, denies orthopnea, no edema. GI:  Denies N/V/D/C or blood in stool; mild reflux, adm w/ abd pain/ distention. GU:  No dysuria, freq, urgency, hematuria, or flank pain. MS:  Denies joint pain, swelling, tenderness, or decr ROM; no neck pain, back pain, etc. Neuro:  No tremors, seizures, dizziness, syncope, weakness, numbness, gait abn. Skin:  No suspicious lesions or skin rash. Heme:  No adenopathy, bruising, bleeding. Psyche: 42 yr hx schizophrenia on meds prescribed by DrKilpatrick   SUBJECTIVE:   VITAL SIGNS: Temp:  [98.4 F (36.9 C)-99 F (37.2 C)] 98.8 F (37.1 C) (03/22 0458) Pulse Rate:  [73-89] 73 (03/22 0458) Resp:  [18] 18 (03/22 0458) BP: (122-148)/(72-77) 148/72 mmHg (03/22 0458) SpO2:  [91 %-95 %] 94 % (03/22  0724) Weight:  [64.547 kg (142 lb 4.8 oz)] 64.547 kg (142 lb 4.8 oz) (03/22 0458) HEMODYNAMICS:   VENTILATOR SETTINGS:   INTAKE / OUTPUT: Intake/Output     03/21 0701 - 03/22 0700 03/22 0701 - 03/23 0700   I.V. (mL/kg) 3 (0)    IV Piggyback     Total Intake(mL/kg) 3 (0)    Urine (mL/kg/hr) 2200 (1.4) 300 (1.9)   Total Output 2200 300   Net -2197 -300         PHYSICAL EXAMINATION: Vital Signs:  Reviewed...  General:  WD, WN, 58 y/o BM in NAD; alert & cooperative; flat affect... HEENT:  [place]/AT; Conjunctiva- pink, Sclera- nonicteric, EOM-wnl, PERRLA, Fundi-benign; EACs-clear, TMs-wnl; NOSE-clear; THROAT-clear & wnl. Neck:  Supple w/ full ROM; no JVD; normal carotid impulses w/o bruits; no thyromegaly or nodules palpated; no lymphadenopathy. Chest:  Decreased BS bilat; without wheezes, rales, or rhonchi heard. Heart:  Regular Rhythm; norm S1 & S2 without murmurs, rubs, or gallops detected. Abdomen:  Soft & min tender- no guarding or rebound at present; normal bowel sounds; no organomegaly or masses palpated. Ext:  Normal ROM; without deformities or arthritic changes; no varicose veins, +venous insuffic, no edema;  Pulses intact w/o bruits. Neuro:  CNs II-XII intact; motor testing normal; sensory testing normal; gait normal & balance OK. Derm:  No lesions noted; no rash etc. Lymph:  No cervical, supraclavicular, axillary, or inguinal adenopathy palpated.   LABS: CBC  Recent Labs Lab 06/03/13 0650 06/04/13 1039 06/05/13 0628  WBC 13.5* 9.9 7.6  HGB 14.6 13.4 14.1  HCT 41.7 38.2* 39.7  PLT 191 178 198   Coag's  Recent Labs Lab 06/03/13 0515  INR 0.99   BMET  Recent Labs Lab 06/03/13 0650 06/04/13 1039 06/05/13 0628  NA 135* 135* 137  K 4.3 4.3 4.0  CL 97 99 99  CO2 23 23 22   BUN 7 6 4*  CREATININE 0.59 0.73 0.57  GLUCOSE 137* 89 98   Electrolytes  Recent Labs Lab 06/03/13 0650 06/04/13 1039 06/05/13 0628  CALCIUM 8.6 8.4 9.2   Sepsis  Markers  Recent Labs Lab 06/03/13 0550 06/05/13 0628  LATICACIDVEN 2.54* 1.6   ABG No results found for this basename: PHART, PCO2ART, PO2ART,  in the last 168 hours Liver Enzymes  Recent Labs Lab 06/03/13 0650 06/04/13 1039 06/05/13 0628  AST 550* 105* 51*  ALT 513* 268* 204*  ALKPHOS 115 101 102  BILITOT 3.9* 3.2* 1.8*  ALBUMIN 3.2* 2.9* 3.1*   Cardiac Enzymes No results found for this basename: TROPONINI, PROBNP,  in the last 168 hours Glucose  Recent Labs Lab 06/03/13 1156 06/04/13 0017 06/04/13 0616 06/04/13 1129 06/04/13 1726 06/05/13 0001  GLUCAP 115* 97 83 82 81 90    Imaging  3/19>  CXR>  IMPRESSION:  1. Bullous emphysema. Due to the extensive disease and young age, alpha-1 antitrypsin deficiency should be considered. 2. Interstitial thickening which could be chronic or from interstitial edema. No cardiomegaly.   3/20>  [place] ABD>   IMPRESSION:  Intrahepatic biliary ductal dilatation. The common bile duct is mildly dilated measuring 0.8 cm. The cause for dilatation is not identified. Fatty infiltration of the liver. Negative for gallstones.  3/20>  CT ABD>  IMPRESSION:  Mild intrahepatic biliary ductal dilatation is nonspecific but could be related to cholangitis. The common bile duct appears normal. Negative for hepatic abscess. Fatty infiltration of the liver. Severe emphysema.  Dg Ercp Biliary & Pancreatic Ducts  06/03/2013   CLINICAL DATA:  ERCP  EXAM: ERCP with sphincterotomy  FLUOROSCOPY TIME:  3 min 39 seconds  TECHNIQUE: Multiple spot images obtained with the fluoroscopic device and submitted for interpretation post-procedure.  COMPARISON:  None.  FINDINGS: Distal end of an endoscope is visualized. There is a wire within the common bile duct and extending into the common hepatic duct. There is contrast injected into the common bile duct without a filling defect. There is mild intrahepatic biliary ductal dilatation. There is balloon dilatation of the  distal common bile duct.  IMPRESSION: ERCP with sphincterotomy.  These images were submitted for radiologic interpretation only. Please see the procedural report for the amount of contrast and the fluoroscopy time utilized.   Electronically Signed   By: Kathreen Devoid   On: 06/03/2013 18:16     ASSESSMENT / PLAN:  PULMONARY A:  severe bullous emphysema, certainly invokes suspicion for poss A1AT defic... P:   - notify DrKilpatrick on Mon 3/23 re: admission and inquire about  old records, prev PFTs, prev labs & A1AT data... - check spirometry now and full PFTs later... - check A1AT enz level and phenotype - continue therapy w/ NEBS (using duoneb Qid) perioperatively and transition to LABA/LAMA later...   CARDIOVASCULAR A:  HBP on Vasotec10mg /d as outpt... P:  - continue vasotec10 - monitor BP    RENAL A:  No dx - Cr is 0.4 P:     GASTROINTESTINAL A:  Acute cholangitis, CBD stones, s/p ERCP w/ balloon extraction & sphincterotomy Hx DU & GERD on Omeprazole & Pepcid as outpt... P:   - continue PPI therapy  - proceed w/ Lap Chole per [facility]   HEMATOLOGIC A:  No dx - Hg= 14.1 P:    INFECTIOUS A:  Cholangitis, s/p ERCP, pending LapChole... P:   - continue Zosyn...   ENDOCRINE A:  No dx - labs reviewed P:     NEURO/PSYCHE A:  Hx schizophrenia x yrs on Cogentin, Remeron, Risperdal as outpt... P:   - continue home meds   Kolby Schara M. Lenna Gilford, MD [facility] [place] Pager: [phone] 06/05/2013, 9:30 AM

## 2013-06-05 NOTE — Progress Notes (Signed)
Progress Note   Subjective  Feeling better, no abd pain, N/V   Objective  Vital signs in last 24 hours: Temp:  [98.4 F (36.9 C)-99 F (37.2 C)] 98.8 F (37.1 C) (03/22 0458) Pulse Rate:  [73-89] 73 (03/22 0458) Resp:  [18] 18 (03/22 0458) BP: (122-148)/(72-77) 148/72 mmHg (03/22 0458) SpO2:  [91 %-95 %] 94 % (03/22 0724) Weight:  [142 lb 4.8 oz (64.547 kg)] 142 lb 4.8 oz (64.547 kg) (03/22 0458)  General:   Alert, well-developed, male in NAD Heart:  Regular rate and rhythm; no murmurs Abdomen:  Soft, nontender and nondistended. Normal bowel sounds, without guarding, and without rebound.   Extremities:  Without edema. Neurologic:  Alert and  oriented x4;  grossly normal neurologically. Psych:  Alert and cooperative. Normal mood and affect.  Intake/Output from previous day: 03/21 0701 - 03/22 0700 In: 3 [I.V.:3] Out: 2200 [Urine:2200] Intake/Output this shift:    Lab Results:  Recent Labs  06/03/13 0650 06/04/13 1039 06/05/13 0628  WBC 13.5* 9.9 7.6  HGB 14.6 13.4 14.1  HCT 41.7 38.2* 39.7  PLT 191 178 198   BMET  Recent Labs  06/03/13 0650 06/04/13 1039 06/05/13 0628  NA 135* 135* 137  K 4.3 4.3 4.0  CL 97 99 99  CO2 23 23 22   GLUCOSE 137* 89 98  BUN 7 6 4*  CREATININE 0.59 0.73 0.57  CALCIUM 8.6 8.4 9.2   LFT  Recent Labs  06/03/13 0650  06/05/13 0628  PROT 6.6  < > 6.9  ALBUMIN 3.2*  < > 3.1*  AST 550*  < > 51*  ALT 513*  < > 204*  ALKPHOS 115  < > 102  BILITOT 3.9*  < > 1.8*  BILIDIR 3.4*  --   --   IBILI 0.5  --   --   < > = values in this interval not displayed. PT/INR  Recent Labs  06/03/13 0515  LABPROT 12.9  INR 0.99   Hepatitis Panel  Recent Labs  06/03/13 0515  HEPBSAG NEGATIVE  HCVAB NEGATIVE  HEPAIGM NON REACTIVE  HEPBIGM NON REACTIVE    Studies/Results: Dg Ercp Biliary & Pancreatic Ducts  06/03/2013   CLINICAL DATA:  ERCP  EXAM: ERCP with sphincterotomy  FLUOROSCOPY TIME:  3 min 39 seconds  TECHNIQUE:  Multiple spot images obtained with the fluoroscopic device and submitted for interpretation post-procedure.  COMPARISON:  None.  FINDINGS: Distal end of an endoscope is visualized. There is a wire within the common bile duct and extending into the common hepatic duct. There is contrast injected into the common bile duct without a filling defect. There is mild intrahepatic biliary ductal dilatation. There is balloon dilatation of the distal common bile duct.  IMPRESSION: ERCP with sphincterotomy.  These images were submitted for radiologic interpretation only. Please see the procedural report for the amount of contrast and the fluoroscopy time utilized.   Electronically Signed   By: Kathreen Devoid   On: 06/03/2013 18:16      Assessment & Plan   1. Cholangitis, choledocholithiasis. S/P ERCP with sphincterotomy and stone extraction yesterday. Markedly improved. LFTs, t bili and WBC all improved. Advance diet as tolerated. Continue IV antibiotics and complete at least 7 days total of antibiotics. Trend LFTs, CBC. Surgical consult recommends cholecystectomy pending pulmonary clearance. OP GI follow up is not needed at this time. GI signing off.   Principal Problem:   Sepsis Active Problems:   Abdominal pain  Elevated LFTs   HTN (hypertension)   HLD (hyperlipidemia)   COPD (chronic obstructive pulmonary disease)   Calculus of bile duct without mention of cholecystitis or obstruction    LOS: 3 days   Herlinda Heady T. Fuller Plan MD  06/05/2013, 9:16 AM

## 2013-06-05 NOTE — Progress Notes (Signed)
Doing better. Needs LGB in near future.

## 2013-06-05 NOTE — Progress Notes (Signed)
PROGRESS NOTE  Blake Mcknight FIE:[id number] DOB: 1955/11/22 DOA: 06/02/2013 PCP: No PCP Per Patient  HPI/Subjective: 58 yo male presenting to ED on 3/19 RUQ pain and tachycardia, sent from urgent care for further evaluation. Has nausea but no vomiting. Pain is sharp in quality and moderate in severity. No known history of gallstones or gallbladder disease. Is a heavy smoker with history of COPD reporting mild dyspnea.   Subjective: Denies any complaints today. Pulmonary to evaluate prior to his surgery.  Assessment/Plan:  Sepsis  -Likely from biliary infection -Patient currently stable.  Acute Ascending Cholangitis - CT abdomen/ pelvis: Mild intrahepatic biliary ductal dilatation & fatty infiltration of the liver. - US abdomen: (-) gallstones, mild CBD dilation, increased echogenicity of liver - Patient has been placed on Zosyn - ERCP done with extraction of multiple CBD stones, sphincterotomy and some pus. - Gen. surgery consulted, likely to have cholecystectomy in a.m., waiting on pulmonary evaluation  COPD/severe bullous emphysema  - DG chest: diffuse emphysematous change, with numerous chronic bullae - Pulmonology to evaluate, preop risk stratification as patient has very severe disease.  Hypertension  - Since patient is n.p.o. patient has been placed on IV hydralazine for systolic blood pressure more than 160.   Hyperlipidemia - hold statins due to elevated LFTs.  Depression  - resume risperidone and remeron.   DVT Prophylaxis:  SCDs  Code Status: full Family Communication: none at bedside  Disposition Plan: remain inpatient.    Consultants:  GI: currently NPO if need for procedure     Objective: Filed Vitals:   06/04/13 2055 06/05/13 0248 06/05/13 0458 06/05/13 0724  BP: 122/77  148/72   Pulse: 89 75 73   Temp: 98.4 F (36.9 C)  98.8 F (37.1 C)   TempSrc: Oral  Oral   Resp: 18 18 18    Height:      Weight:   64.547 kg (142 lb 4.8 oz)   SpO2:  92% 95% 94% 94%    Intake/Output Summary (Last 24 hours) at 06/05/13 1008 Last data filed at 06/05/13 0900  Gross per 24 hour  Intake      3 ml  Output   2500 ml  Net  -2497 ml   Filed Weights   06/02/13 2056 06/04/13 0447 06/05/13 0458  Weight: 83.915 kg (185 lb) 75.524 kg (166 lb 8 oz) 64.547 kg (142 lb 4.8 oz)    Exam: General: Well-developed and moderately nourished. Lying comfortably in bed. Eyes: PERRLA, anicteric ENT: mmm Neck: No mass felt. supple Cardiovascular: S1-S2 heard. RRR Respiratory: Bilateral expiratory wheeze, no accessory muscle use. Abdomen: soft, no tenderness, guarding or rigidity. Non tender, no masses Skin: No rash.  Musculoskeletal: No edema.  Psychiatric: flat affect, 1 word appropriate answers  Neurologic: Alert awake oriented to time place and person. Moves all extremities.   Data Reviewed: Basic Metabolic Panel:  Recent Labs Lab 06/02/13 2106 06/03/13 0650 06/04/13 1039 06/05/13 0628  NA 135* 135* 135* 137  K 3.9 4.3 4.3 4.0  CL 94* 97 99 99  CO2 25 23 23 22   GLUCOSE 150* 137* 89 98  BUN 6 7 6  4*  CREATININE 0.59 0.59 0.73 0.57  CALCIUM 9.5 8.6 8.4 9.2   Liver Function Tests:  Recent Labs Lab 06/02/13 2106 06/03/13 0650 06/04/13 1039 06/05/13 0628  AST 554* 550* 105* 51*  ALT 349* 513* 268* 204*  ALKPHOS 110 115 101 102  BILITOT 2.6* 3.9* 3.2* 1.8*  PROT 7.9 6.6 6.3 6.9  ALBUMIN 4.0 3.2* 2.9* 3.1*    Recent Labs Lab 06/02/13 2106  LIPASE 18   CBC:  Recent Labs Lab 06/02/13 2106 06/03/13 0650 06/04/13 1039 06/05/13 0628  WBC 14.4* 13.5* 9.9 7.6  NEUTROABS 12.8* 12.1* 7.6  --   HGB 16.2 14.6 13.4 14.1  HCT 44.8 41.7 38.2* 39.7  MCV 87.0 87.4 87.8 87.1  PLT 237 191 178 198   CBG:  Recent Labs Lab 06/04/13 0017 06/04/13 0616 06/04/13 1129 06/04/13 1726 06/05/13 0001  GLUCAP 97 83 82 81 90     Studies: Dg Ercp Biliary & Pancreatic Ducts  06/03/2013   CLINICAL DATA:  ERCP  EXAM: ERCP with  sphincterotomy  FLUOROSCOPY TIME:  3 min 39 seconds  TECHNIQUE: Multiple spot images obtained with the fluoroscopic device and submitted for interpretation post-procedure.  COMPARISON:  None.  FINDINGS: Distal end of an endoscope is visualized. There is a wire within the common bile duct and extending into the common hepatic duct. There is contrast injected into the common bile duct without a filling defect. There is mild intrahepatic biliary ductal dilatation. There is balloon dilatation of the distal common bile duct.  IMPRESSION: ERCP with sphincterotomy.  These images were submitted for radiologic interpretation only. Please see the procedural report for the amount of contrast and the fluoroscopy time utilized.   Electronically Signed   By: Kathreen Devoid   On: 06/03/2013 18:16    Scheduled Meds: . budesonide (PULMICORT) nebulizer solution  0.25 mg Nebulization BID  . ipratropium-albuterol  3 mL Nebulization Q6H  . mirtazapine  30 mg Oral Daily  . pantoprazole  40 mg Oral Daily  . piperacillin-tazobactam (ZOSYN)  IV  3.375 g Intravenous 3 times per day  . risperidone  4 mg Oral BID  . sodium chloride  3 mL Intravenous Q12H   Continuous Infusions: . lactated ringers 75 mL/hr at 06/05/13 0503    Principal Problem:   Sepsis Active Problems:   Abdominal pain   Elevated LFTs   HTN (hypertension)   HLD (hyperlipidemia)   COPD (chronic obstructive pulmonary disease)   Calculus of bile duct without mention of cholecystitis or obstruction    [facility] Pager [phone]. If 7PM-7AM, please contact night-coverage at [email or web address], password [facility] 06/05/2013, 10:08 AM  LOS: 3 days

## 2013-06-05 NOTE — Progress Notes (Signed)
Patient ID: Blake Mcknight, male   DOB: 1955-05-07, 58 y.o.   MRN: [id number]  Subjective: Denies pain, n/v.  Tolerated liquids.   Objective:  Vital signs:  Filed Vitals:   06/04/13 2055 06/05/13 0248 06/05/13 0458 06/05/13 0724  BP: 122/77  148/72   Pulse: 89 75 73   Temp: 98.4 F (36.9 C)  98.8 F (37.1 C)   TempSrc: Oral  Oral   Resp: '18 18 18   ' Height:      Weight:   142 lb 4.8 oz (64.547 kg)   SpO2: 92% 95% 94% 94%    Last BM Date:  (PTA)  Intake/Output   Yesterday:  03/21 0701 - 03/22 0700 In: 3 [I.V.:3] Out: 2200 [Urine:2200] This shift:    I/O last 3 completed shifts: In: 1303 [I.V.:1253; IV Piggyback:50] Out: 2375 [Urine:2375]    Physical Exam: General: Pt awake/alert/oriented x3 in no acute distress Abdomen: Soft.  Nondistended.  Non tender.  No evidence of peritonitis.  No incarcerated hernias.    Problem List:   Principal Problem:   Sepsis Active Problems:   Abdominal pain   Elevated LFTs   HTN (hypertension)   HLD (hyperlipidemia)   COPD (chronic obstructive pulmonary disease)   Calculus of bile duct without mention of cholecystitis or obstruction    Results:   Labs: Results for orders placed during the hospital encounter of 06/02/13 (from the past 48 hour(s))  GLUCOSE, CAPILLARY     Status: Abnormal   Collection Time    06/03/13 11:56 AM      Result Value Ref Range   Glucose-Capillary 115 (*) 70 - 99 mg/dL  GLUCOSE, CAPILLARY     Status: None   Collection Time    06/04/13 12:17 AM      Result Value Ref Range   Glucose-Capillary 97  70 - 99 mg/dL  GLUCOSE, CAPILLARY     Status: None   Collection Time    06/04/13  6:16 AM      Result Value Ref Range   Glucose-Capillary 83  70 - 99 mg/dL  CBC WITH DIFFERENTIAL     Status: Abnormal   Collection Time    06/04/13 10:39 AM      Result Value Ref Range   WBC 9.9  4.0 - 10.5 K/uL   RBC 4.35  4.22 - 5.81 MIL/uL   Hemoglobin 13.4  13.0 - 17.0 g/dL   HCT 38.2 (*) 39.0 - 52.0 %    MCV 87.8  78.0 - 100.0 fL   MCH 30.8  26.0 - 34.0 pg   MCHC 35.1  30.0 - 36.0 g/dL   RDW 14.4  11.5 - 15.5 %   Platelets 178  150 - 400 K/uL   Neutrophils Relative % 77  43 - 77 %   Neutro Abs 7.6  1.7 - 7.7 K/uL   Lymphocytes Relative 14  12 - 46 %   Lymphs Abs 1.4  0.7 - 4.0 K/uL   Monocytes Relative 7  3 - 12 %   Monocytes Absolute 0.7  0.1 - 1.0 K/uL   Eosinophils Relative 2  0 - 5 %   Eosinophils Absolute 0.2  0.0 - 0.7 K/uL   Basophils Relative 0  0 - 1 %   Basophils Absolute 0.0  0.0 - 0.1 K/uL  COMPREHENSIVE METABOLIC PANEL     Status: Abnormal   Collection Time    06/04/13 10:39 AM      Result Value Ref Range  Sodium 135 (*) 137 - 147 mEq/L   Potassium 4.3  3.7 - 5.3 mEq/L   Chloride 99  96 - 112 mEq/L   CO2 23  19 - 32 mEq/L   Glucose, Bld 89  70 - 99 mg/dL   BUN 6  6 - 23 mg/dL   Creatinine, Ser 0.73  0.50 - 1.35 mg/dL   Calcium 8.4  8.4 - 10.5 mg/dL   Total Protein 6.3  6.0 - 8.3 g/dL   Albumin 2.9 (*) 3.5 - 5.2 g/dL   AST 105 (*) 0 - 37 U/L   ALT 268 (*) 0 - 53 U/L   Alkaline Phosphatase 101  39 - 117 U/L   Total Bilirubin 3.2 (*) 0.3 - 1.2 mg/dL   GFR calc non Af Amer >90  >90 mL/min   GFR calc Af Amer >90  >90 mL/min   Comment: (NOTE)     The eGFR has been calculated using the CKD EPI equation.     This calculation has not been validated in all clinical situations.     eGFR's persistently <90 mL/min signify possible Chronic Kidney     Disease.  GLUCOSE, CAPILLARY     Status: None   Collection Time    06/04/13 11:29 AM      Result Value Ref Range   Glucose-Capillary 82  70 - 99 mg/dL  GLUCOSE, CAPILLARY     Status: None   Collection Time    06/04/13  5:26 PM      Result Value Ref Range   Glucose-Capillary 81  70 - 99 mg/dL  GLUCOSE, CAPILLARY     Status: None   Collection Time    06/05/13 12:01 AM      Result Value Ref Range   Glucose-Capillary 90  70 - 99 mg/dL   Comment 1 Notify RN    COMPREHENSIVE METABOLIC PANEL     Status: Abnormal    Collection Time    06/05/13  6:28 AM      Result Value Ref Range   Sodium 137  137 - 147 mEq/L   Potassium 4.0  3.7 - 5.3 mEq/L   Chloride 99  96 - 112 mEq/L   CO2 22  19 - 32 mEq/L   Glucose, Bld 98  70 - 99 mg/dL   BUN 4 (*) 6 - 23 mg/dL   Creatinine, Ser 0.57  0.50 - 1.35 mg/dL   Calcium 9.2  8.4 - 10.5 mg/dL   Total Protein 6.9  6.0 - 8.3 g/dL   Albumin 3.1 (*) 3.5 - 5.2 g/dL   AST 51 (*) 0 - 37 U/L   ALT 204 (*) 0 - 53 U/L   Alkaline Phosphatase 102  39 - 117 U/L   Total Bilirubin 1.8 (*) 0.3 - 1.2 mg/dL   GFR calc non Af Amer >90  >90 mL/min   GFR calc Af Amer >90  >90 mL/min   Comment: (NOTE)     The eGFR has been calculated using the CKD EPI equation.     This calculation has not been validated in all clinical situations.     eGFR's persistently <90 mL/min signify possible Chronic Kidney     Disease.  CBC     Status: None   Collection Time    06/05/13  6:28 AM      Result Value Ref Range   WBC 7.6  4.0 - 10.5 K/uL   RBC 4.56  4.22 - 5.81 MIL/uL   Hemoglobin  14.1  13.0 - 17.0 g/dL   HCT 39.7  39.0 - 52.0 %   MCV 87.1  78.0 - 100.0 fL   MCH 30.9  26.0 - 34.0 pg   MCHC 35.5  30.0 - 36.0 g/dL   RDW 14.3  11.5 - 15.5 %   Platelets 198  150 - 400 K/uL  LACTIC ACID, PLASMA     Status: None   Collection Time    06/05/13  6:28 AM      Result Value Ref Range   Lactic Acid, Venous 1.6  0.5 - 2.2 mmol/L    Imaging / Studies: Dg Ercp Biliary & Pancreatic Ducts  06/03/2013   CLINICAL DATA:  ERCP  EXAM: ERCP with sphincterotomy  FLUOROSCOPY TIME:  3 min 39 seconds  TECHNIQUE: Multiple spot images obtained with the fluoroscopic device and submitted for interpretation post-procedure.  COMPARISON:  None.  FINDINGS: Distal end of an endoscope is visualized. There is a wire within the common bile duct and extending into the common hepatic duct. There is contrast injected into the common bile duct without a filling defect. There is mild intrahepatic biliary ductal dilatation. There  is balloon dilatation of the distal common bile duct.  IMPRESSION: ERCP with sphincterotomy.  These images were submitted for radiologic interpretation only. Please see the procedural report for the amount of contrast and the fluoroscopy time utilized.   Electronically Signed   By: Kathreen Devoid   On: 06/03/2013 18:16    Medications / Allergies: per chart  Antibiotics: Anti-infectives   Start     Dose/Rate Route Frequency Ordered Stop   06/03/13 1200  piperacillin-tazobactam (ZOSYN) IVPB 3.375 g     3.375 g 12.5 mL/hr over 240 Minutes Intravenous 3 times per day 06/03/13 0623     06/03/13 1100  Ampicillin-Sulbactam (UNASYN) 3 g in sodium chloride 0.9 % 100 mL IVPB  Status:  Discontinued     3 g 100 mL/hr over 60 Minutes Intravenous  Once 06/03/13 1046 06/03/13 1047   06/03/13 0445  piperacillin-tazobactam (ZOSYN) IVPB 3.375 g     3.375 g 12.5 mL/hr over 240 Minutes Intravenous  Once 06/03/13 0433 06/03/13 0908   06/03/13 0215  cefTRIAXone (ROCEPHIN) 1 g in dextrose 5 % 50 mL IVPB     1 g 100 mL/hr over 30 Minutes Intravenous  Once 06/03/13 5189 06/03/13 0539      Assessment/Plan COPD  HTN  Sepsis  Acute cholangitis s/p ERCP w/ stone and pus removal(3/21)   We recommend a laparoscopic cholecystectomy.  The patients sister wishes to discuss the patients respiratory status with the primary service before we proceed with surgical intervention.  We will await families decision.  Erby Pian, [facility] Surgery Pager [phone] Office [phone]  06/05/2013  8:49 AM

## 2013-06-06 ENCOUNTER — Encounter (HOSPITAL_COMMUNITY)
Admission: EM | Disposition: A | Discharge status: Home / Self Care | Payer: Self-pay | Source: Home / Self Care | Attending: Internal Medicine

## 2013-06-06 ENCOUNTER — Inpatient Hospital Stay (HOSPITAL_COMMUNITY): Payer: Medicare Other | Admitting: Anesthesiology

## 2013-06-06 ENCOUNTER — Encounter (HOSPITAL_COMMUNITY): Payer: Medicare Other | Admitting: Anesthesiology

## 2013-06-06 ENCOUNTER — Inpatient Hospital Stay (HOSPITAL_COMMUNITY): Payer: Medicare Other

## 2013-06-06 ENCOUNTER — Encounter (HOSPITAL_COMMUNITY): Payer: Self-pay | Admitting: Gastroenterology

## 2013-06-06 DIAGNOSIS — F17200 Nicotine dependence, unspecified, uncomplicated: Secondary | ICD-10-CM

## 2013-06-06 DIAGNOSIS — K8010 Calculus of gallbladder with chronic cholecystitis without obstruction: Secondary | ICD-10-CM

## 2013-06-06 DIAGNOSIS — E8801 Alpha-1-antitrypsin deficiency: Secondary | ICD-10-CM

## 2013-06-06 HISTORY — PX: CHOLECYSTECTOMY: SHX55

## 2013-06-06 LAB — GLUCOSE, CAPILLARY
GLUCOSE-CAPILLARY: 94 mg/dL (ref 70–99)
Glucose-Capillary: 112 mg/dL — ABNORMAL HIGH (ref 70–99)
Glucose-Capillary: 87 mg/dL (ref 70–99)
Glucose-Capillary: 97 mg/dL (ref 70–99)
Glucose-Capillary: 99 mg/dL (ref 70–99)

## 2013-06-06 LAB — COMPREHENSIVE METABOLIC PANEL
ALT: 138 U/L — ABNORMAL HIGH (ref 0–53)
AST: 32 U/L (ref 0–37)
Albumin: 3.2 g/dL — ABNORMAL LOW (ref 3.5–5.2)
Alkaline Phosphatase: 90 U/L (ref 39–117)
BILIRUBIN TOTAL: 1.4 mg/dL — AB (ref 0.3–1.2)
CHLORIDE: 101 meq/L (ref 96–112)
CO2: 24 mEq/L (ref 19–32)
CREATININE: 0.6 mg/dL (ref 0.50–1.35)
Calcium: 9.2 mg/dL (ref 8.4–10.5)
GFR calc Af Amer: >90 mL/min (ref >90)
GFR calc non Af Amer: >90 mL/min (ref >90)
Glucose, Bld: 93 mg/dL (ref 70–99)
Potassium: 3.9 mEq/L (ref 3.7–5.3)
Sodium: 138 mEq/L (ref 137–147)
Total Protein: 6.8 g/dL (ref 6.0–8.3)

## 2013-06-06 SURGERY — LAPAROSCOPIC CHOLECYSTECTOMY WITH INTRAOPERATIVE CHOLANGIOGRAM
Anesthesia: General | Site: Abdomen

## 2013-06-06 MED ORDER — MEPERIDINE HCL 25 MG/ML IJ SOLN: 6.2500 mg | INTRAMUSCULAR | Status: DC | PRN

## 2013-06-06 MED ORDER — LIDOCAINE HCL (CARDIAC) 20 MG/ML IV SOLN
INTRAVENOUS | Status: AC
  Filled 2013-06-06: qty 25

## 2013-06-06 MED ORDER — OXYCODONE HCL 5 MG/5ML PO SOLN: 5.0000 mg | Freq: Once | ORAL | Status: DC | PRN

## 2013-06-06 MED ORDER — SUCCINYLCHOLINE CHLORIDE 20 MG/ML IJ SOLN
INTRAMUSCULAR | Status: DC | PRN
Start: 2013-06-06 — End: 2013-06-06
  Administered 2013-06-06: 60 mg via INTRAVENOUS

## 2013-06-06 MED ORDER — ALBUTEROL SULFATE HFA 108 (90 BASE) MCG/ACT IN AERS
INHALATION_SPRAY | RESPIRATORY_TRACT | Status: AC
  Filled 2013-06-06: qty 6.7

## 2013-06-06 MED ORDER — NEOSTIGMINE METHYLSULFATE 1 MG/ML IJ SOLN
INTRAMUSCULAR | Status: DC | PRN
  Administered 2013-06-06: 3 mg via INTRAVENOUS

## 2013-06-06 MED ORDER — ROCURONIUM BROMIDE 100 MG/10ML IV SOLN
INTRAVENOUS | Status: DC | PRN
  Administered 2013-06-06 (×2): 10 mg via INTRAVENOUS
  Administered 2013-06-06: 20 mg via INTRAVENOUS

## 2013-06-06 MED ORDER — IPRATROPIUM-ALBUTEROL 20-100 MCG/ACT IN AERS
INHALATION_SPRAY | RESPIRATORY_TRACT | Status: DC | PRN
  Administered 2013-06-06: 4 via RESPIRATORY_TRACT

## 2013-06-06 MED ORDER — LACTATED RINGERS IV SOLN
INTRAVENOUS | Status: DC | PRN
  Administered 2013-06-06 (×2): via INTRAVENOUS

## 2013-06-06 MED ORDER — MIDAZOLAM HCL 5 MG/5ML IJ SOLN
INTRAMUSCULAR | Status: DC | PRN
  Administered 2013-06-06: 1 mg via INTRAVENOUS

## 2013-06-06 MED ORDER — PROPOFOL 10 MG/ML IV BOLUS
INTRAVENOUS | Status: AC
  Filled 2013-06-06: qty 20

## 2013-06-06 MED ORDER — CEFAZOLIN SODIUM-DEXTROSE 2-3 GM-% IV SOLR
INTRAVENOUS | Status: AC
  Filled 2013-06-06: qty 50

## 2013-06-06 MED ORDER — OXYCODONE HCL 5 MG PO TABS: 5.0000 mg | ORAL_TABLET | Freq: Once | ORAL | Status: DC | PRN

## 2013-06-06 MED ORDER — PROMETHAZINE HCL 25 MG/ML IJ SOLN: 6.2500 mg | INTRAMUSCULAR | Status: DC | PRN

## 2013-06-06 MED ORDER — GLYCOPYRROLATE 0.2 MG/ML IJ SOLN
INTRAMUSCULAR | Status: AC
  Filled 2013-06-06: qty 2

## 2013-06-06 MED ORDER — PROPOFOL 10 MG/ML IV BOLUS
INTRAVENOUS | Status: DC | PRN
  Administered 2013-06-06: 40 mg via INTRAVENOUS
  Administered 2013-06-06: 120 mg via INTRAVENOUS

## 2013-06-06 MED ORDER — PIPERACILLIN-TAZOBACTAM 3.375 G IVPB 30 MIN
3.3750 g | INTRAVENOUS | Status: DC
  Administered 2013-06-06: 3.375 g via INTRAVENOUS
  Filled 2013-06-06: qty 50

## 2013-06-06 MED ORDER — MIDAZOLAM HCL 2 MG/2ML IJ SOLN
INTRAMUSCULAR | Status: AC
  Filled 2013-06-06: qty 2

## 2013-06-06 MED ORDER — ARTIFICIAL TEARS OP OINT
TOPICAL_OINTMENT | OPHTHALMIC | Status: AC
  Filled 2013-06-06: qty 3.5

## 2013-06-06 MED ORDER — BUPIVACAINE-EPINEPHRINE 0.25% -1:200000 IJ SOLN
INTRAMUSCULAR | Status: DC | PRN
  Administered 2013-06-06: 20 mL

## 2013-06-06 MED ORDER — 0.9 % SODIUM CHLORIDE (POUR BTL) OPTIME
TOPICAL | Status: DC | PRN
  Administered 2013-06-06: 1000 mL

## 2013-06-06 MED ORDER — BUPIVACAINE-EPINEPHRINE (PF) 0.25% -1:200000 IJ SOLN
INTRAMUSCULAR | Status: AC
  Filled 2013-06-06: qty 30

## 2013-06-06 MED ORDER — ONDANSETRON HCL 4 MG/2ML IJ SOLN
INTRAMUSCULAR | Status: AC
  Filled 2013-06-06: qty 2

## 2013-06-06 MED ORDER — HYDROMORPHONE HCL PF 1 MG/ML IJ SOLN: 0.2500 mg | INTRAMUSCULAR | Status: DC | PRN

## 2013-06-06 MED ORDER — FENTANYL CITRATE 0.05 MG/ML IJ SOLN
INTRAMUSCULAR | Status: DC | PRN
  Administered 2013-06-06 (×3): 50 ug via INTRAVENOUS
  Administered 2013-06-06: 100 ug via INTRAVENOUS

## 2013-06-06 MED ORDER — OXYCODONE HCL 5 MG PO TABS: 5.0000 mg | ORAL_TABLET | ORAL | Status: DC | PRN

## 2013-06-06 MED ORDER — IPRATROPIUM-ALBUTEROL 0.5-2.5 (3) MG/3ML IN SOLN
3.0000 mL | Freq: Three times a day (TID) | RESPIRATORY_TRACT | Status: DC
  Administered 2013-06-07: 3 mL via RESPIRATORY_TRACT
  Filled 2013-06-06: qty 3

## 2013-06-06 MED ORDER — SODIUM CHLORIDE 0.9 % IV SOLN
INTRAVENOUS | Status: DC
Start: 2013-06-06 — End: 2013-06-08
  Administered 2013-06-06 – 2013-06-07 (×2): via INTRAVENOUS
  Administered 2013-06-07: 100 mL/h via INTRAVENOUS
  Administered 2013-06-08: 05:00:00 via INTRAVENOUS

## 2013-06-06 MED ORDER — ONDANSETRON HCL 4 MG/2ML IJ SOLN
INTRAMUSCULAR | Status: DC | PRN
  Administered 2013-06-06: 4 mg via INTRAVENOUS

## 2013-06-06 MED ORDER — SODIUM CHLORIDE 0.9 % IR SOLN
Status: DC | PRN
  Administered 2013-06-06: 1000 mL

## 2013-06-06 MED ORDER — ROCURONIUM BROMIDE 50 MG/5ML IV SOLN
INTRAVENOUS | Status: AC
  Filled 2013-06-06: qty 1

## 2013-06-06 MED ORDER — MORPHINE SULFATE 2 MG/ML IJ SOLN
2.0000 mg | INTRAMUSCULAR | Status: DC | PRN
  Administered 2013-06-06: 2 mg via INTRAVENOUS
  Filled 2013-06-06: qty 1

## 2013-06-06 MED ORDER — GLYCOPYRROLATE 0.2 MG/ML IJ SOLN
INTRAMUSCULAR | Status: DC | PRN
  Administered 2013-06-06: 0.4 mg via INTRAVENOUS

## 2013-06-06 MED ORDER — SODIUM CHLORIDE 0.9 % IV SOLN
INTRAVENOUS | Status: DC | PRN
  Administered 2013-06-06: 14:00:00

## 2013-06-06 MED ORDER — FENTANYL CITRATE 0.05 MG/ML IJ SOLN
INTRAMUSCULAR | Status: AC
  Filled 2013-06-06: qty 5

## 2013-06-06 MED ORDER — LIDOCAINE HCL (CARDIAC) 20 MG/ML IV SOLN
INTRAVENOUS | Status: DC | PRN
  Administered 2013-06-06: 60 mg via INTRAVENOUS

## 2013-06-06 MED ORDER — MIDAZOLAM HCL 2 MG/2ML IJ SOLN: 0.5000 mg | Freq: Once | INTRAMUSCULAR | Status: DC | PRN

## 2013-06-06 MED ORDER — PHENYLEPHRINE HCL 10 MG/ML IJ SOLN
INTRAMUSCULAR | Status: DC | PRN
  Administered 2013-06-06 (×6): 40 ug via INTRAVENOUS

## 2013-06-06 SURGICAL SUPPLY — 53 items
ADH SKN CLS APL DERMABOND .7 (GAUZE/BANDAGES/DRESSINGS) ×1
APPLIER CLIP 5 13 M/L LIGAMAX5 (MISCELLANEOUS) ×3
APPLIER CLIP ROT 10 11.4 M/L (STAPLE)
APR CLP MED LRG 11.4X10 (STAPLE)
APR CLP MED LRG 5 ANG JAW (MISCELLANEOUS) ×1
BAG SPEC RTRVL 10 TROC 200 (ENDOMECHANICALS) ×1
BAG SPEC RTRVL LRG 6X4 10 (ENDOMECHANICALS)
BLADE SURG ROTATE 9660 (MISCELLANEOUS) ×2 IMPLANT
CANISTER SUCTION 2500CC (MISCELLANEOUS) ×3 IMPLANT
CHLORAPREP W/TINT 26ML (MISCELLANEOUS) ×3 IMPLANT
CLIP APPLIE 5 13 M/L LIGAMAX5 (MISCELLANEOUS) ×1 IMPLANT
CLIP APPLIE ROT 10 11.4 M/L (STAPLE) IMPLANT
COVER MAYO STAND STRL (DRAPES) ×3 IMPLANT
COVER SURGICAL LIGHT HANDLE (MISCELLANEOUS) ×3 IMPLANT
DECANTER SPIKE VIAL GLASS SM (MISCELLANEOUS) ×2 IMPLANT
DERMABOND ADVANCED (GAUZE/BANDAGES/DRESSINGS) ×2
DERMABOND ADVANCED .7 DNX12 (GAUZE/BANDAGES/DRESSINGS) ×1 IMPLANT
DRAPE C-ARM 42X72 X-RAY (DRAPES) ×3 IMPLANT
DRAPE UTILITY 15X26 W/TAPE STR (DRAPE) ×6 IMPLANT
ELECT REM PT RETURN 9FT ADLT (ELECTROSURGICAL) ×3
ELECTRODE REM PT RTRN 9FT ADLT (ELECTROSURGICAL) ×1 IMPLANT
FILTER SMOKE EVAC LAPAROSHD (FILTER) IMPLANT
GLOVE BIO SURGEON STRL SZ7 (GLOVE) ×2 IMPLANT
GLOVE BIO SURGEON STRL SZ8 (GLOVE) ×3 IMPLANT
GLOVE BIOGEL PI IND STRL 7.0 (GLOVE) IMPLANT
GLOVE BIOGEL PI IND STRL 8 (GLOVE) ×1 IMPLANT
GLOVE BIOGEL PI INDICATOR 7.0 (GLOVE) ×4
GLOVE BIOGEL PI INDICATOR 8 (GLOVE) ×2
GLOVE SURG SS PI 7.0 STRL IVOR (GLOVE) ×4 IMPLANT
GOWN STRL REUS W/ TWL LRG LVL3 (GOWN DISPOSABLE) ×3 IMPLANT
GOWN STRL REUS W/ TWL XL LVL3 (GOWN DISPOSABLE) ×1 IMPLANT
GOWN STRL REUS W/TWL LRG LVL3 (GOWN DISPOSABLE) ×6
GOWN STRL REUS W/TWL XL LVL3 (GOWN DISPOSABLE) ×3
KIT BASIN OR (CUSTOM PROCEDURE TRAY) ×3 IMPLANT
KIT ROOM TURNOVER OR (KITS) ×3 IMPLANT
NEEDLE 22X1 1/2 (OR ONLY) (NEEDLE) ×3 IMPLANT
NS IRRIG 1000ML POUR BTL (IV SOLUTION) ×3 IMPLANT
PAD ARMBOARD 7.5X6 YLW CONV (MISCELLANEOUS) ×3 IMPLANT
POUCH RETRIEVAL ECOSAC 10 (ENDOMECHANICALS) IMPLANT
POUCH RETRIEVAL ECOSAC 10MM (ENDOMECHANICALS) ×2
POUCH SPECIMEN RETRIEVAL 10MM (ENDOMECHANICALS) IMPLANT
SCISSORS LAP 5X35 DISP (ENDOMECHANICALS) ×3 IMPLANT
SET CHOLANGIOGRAPH 5 50 .035 (SET/KITS/TRAYS/PACK) ×3 IMPLANT
SET IRRIG TUBING LAPAROSCOPIC (IRRIGATION / IRRIGATOR) ×3 IMPLANT
SLEEVE ENDOPATH XCEL 5M (ENDOMECHANICALS) ×6 IMPLANT
SPECIMEN JAR SMALL (MISCELLANEOUS) ×3 IMPLANT
SUT VIC AB 4-0 PS2 27 (SUTURE) ×3 IMPLANT
TOWEL OR 17X24 6PK STRL BLUE (TOWEL DISPOSABLE) ×3 IMPLANT
TOWEL OR 17X26 10 PK STRL BLUE (TOWEL DISPOSABLE) ×3 IMPLANT
TRAY LAPAROSCOPIC (CUSTOM PROCEDURE TRAY) ×3 IMPLANT
TROCAR XCEL BLUNT TIP 100MML (ENDOMECHANICALS) ×3 IMPLANT
TROCAR XCEL NON-BLD 5MMX100MML (ENDOMECHANICALS) ×3 IMPLANT
WATER STERILE IRR 1000ML POUR (IV SOLUTION) IMPLANT

## 2013-06-06 NOTE — Progress Notes (Signed)
Addendum  Patient seen and examined, chart and data base reviewed.  I agree with the above assessment and plan.  For full details please see Mrs. Imogene Burn PA note.  Acute cholangitis, for cholecystectomy today.   Birdie Hopes, MD [facility] Pager: [phone] 06/06/2013, 2:17 PM

## 2013-06-06 NOTE — Transfer of Care (Signed)
Immediate Anesthesia Transfer of Care Note  Patient: Blake Mcknight  Procedure(s) Performed: Procedure(s): LAPAROSCOPIC CHOLECYSTECTOMY WITH ATTEMPTED INTRAOPERATIVE CHOLANGIOGRAM (N/A)  Patient Location: PACU  Anesthesia Type:General  Level of Consciousness: awake, alert  and oriented  Airway & Oxygen Therapy: Patient Spontanous Breathing and Patient connected to face mask oxygen  Post-op Assessment: Report given to PACU RN  Post vital signs: Reviewed and stable  Complications: No apparent anesthesia complications

## 2013-06-06 NOTE — Progress Notes (Signed)
3 Days Post-Op  Subjective: No abdominal pain  Objective: Vital signs in last 24 hours: Temp:  [97.3 F (36.3 C)-98.8 F (37.1 C)] 97.3 F (36.3 C) (03/23 0729) Pulse Rate:  [76-107] 107 (03/23 0729) Resp:  [18-20] 18 (03/23 0729) BP: (123-152)/(79-87) 123/82 mmHg (03/23 0729) SpO2:  [93 %-96 %] 93 % (03/23 0729) Last BM Date: 06/05/13  Intake/Output from previous day: 03/22 0701 - 03/23 0700 In: 360 [P.O.:360] Out: 3425 [Urine:3425] Intake/Output this shift:    General appearance: cooperative Resp: clear to auscultation bilaterally Cardio: regular rate and rhythm GI: soft, NT, no mass  Lab Results:   Recent Labs  06/04/13 1039 06/05/13 0628  WBC 9.9 7.6  HGB 13.4 14.1  HCT 38.2* 39.7  PLT 178 198   BMET  Recent Labs  06/04/13 1039 06/05/13 0628  NA 135* 137  K 4.3 4.0  CL 99 99  CO2 23 22  GLUCOSE 89 98  BUN 6 4*  CREATININE 0.73 0.57  CALCIUM 8.4 9.2   PT/INR No results found for this basename: LABPROT, INR,  in the last 72 hours ABG No results found for this basename: PHART, PCO2, PO2, HCO3,  in the last 72 hours  Studies/Results: No results found.  Anti-infectives: Anti-infectives   Start     Dose/Rate Route Frequency Ordered Stop   06/03/13 1200  piperacillin-tazobactam (ZOSYN) IVPB 3.375 g     3.375 g 12.5 mL/hr over 240 Minutes Intravenous 3 times per day 06/03/13 0623     06/03/13 1100  Ampicillin-Sulbactam (UNASYN) 3 g in sodium chloride 0.9 % 100 mL IVPB  Status:  Discontinued     3 g 100 mL/hr over 60 Minutes Intravenous  Once 06/03/13 1046 06/03/13 1047   06/03/13 0445  piperacillin-tazobactam (ZOSYN) IVPB 3.375 g     3.375 g 12.5 mL/hr over 240 Minutes Intravenous  Once 06/03/13 0433 06/03/13 0908   06/03/13 0215  cefTRIAXone (ROCEPHIN) 1 g in dextrose 5 % 50 mL IVPB     1 g 100 mL/hr over 30 Minutes Intravenous  Once 06/03/13 6144 06/03/13 0539      Assessment/Plan: S/P ERCP for cholangitis For laparoscopic  cholecystectomy with IOC today - procedure, risks, benefits D/W family. Consent obtained.  LOS: 4 days    Blake Mcknight E 06/06/2013

## 2013-06-06 NOTE — Progress Notes (Signed)
PULMONARY / CRITICAL CARE MEDICINE  Name: Blake Mcknight MRN: [id number] DOB: [birth year]    ADMISSION DATE:  06/02/2013 CONSULTATION DATE:  06/05/2013  REFERRING MD :  [facility] PRIMARY SERVICE:  [facility]  REASON FOR CONSULT:  Pre op evaluation  BRIEF PATIENT DESCRIPTION:  58 yo active smoker with emphysema presentedwith acute RUQ abd pain and found to have cholangitis and common bile duct stones.  Underwent ERCP.  PCCM asked to evaluate prior to laparoscopic cholecystectomy.   SIGNIFICANT EVENTS / STUDIES:  3/19  Presented with RUQ abdominal pain 3/20  ERCP, balloon extraction, sphincterotomy 3/23  Laparoscopic cholecystectomy   LINES / TUBES:  CULTURES: 3/20  Blood >>>  ANTIBIOTICS: Zosyn 3/20 >>>   SUBJECTIVE:  Doing well post-op.  Pain controlled, tachycardic otherwise vitals stable.  Resting comfortably.  VITAL SIGNS: Temp:  [97.3 F (36.3 C)-98.8 F (37.1 C)] 97.3 F (36.3 C) (03/23 1114) Pulse Rate:  [76-119] 119 (03/23 1114) Resp:  [18-20] 18 (03/23 1114) BP: (99-152)/(64-87) 99/64 mmHg (03/23 1114) SpO2:  [93 %-96 %] 93 % (03/23 1114) HEMODYNAMICS:   VENTILATOR SETTINGS:   INTAKE / OUTPUT: Intake/Output     03/22 0701 - 03/23 0700 03/23 0701 - 03/24 0700   P.O. 360    I.V. (mL/kg)     Total Intake(mL/kg) 360 (5.6)    Urine (mL/kg/hr) 3425 (2.2) 300 (1)   Total Output 3425 300   Net -3065 -300        Urine Occurrence 2 x    Stool Occurrence 1 x     PHYSICAL EXAMINATION: Vital Signs:  Reviewed...  General: resting in bed, in NAD.  Sleepy post-op but easily arouseable.  Neuro: Non-focal.  HEENT: [place]/AT. PERRL, sclerae anicteric. Cardiovascular: tachy but regular, no M/R/G.  Lungs: Respirations even and unlabored.  CTA bilaterally, No W/R/R.  Abdomen: BS x 4, soft, NT/ND.  Musculoskeletal: No gross deformities, no edema.  Skin: Intact, warm, no rashes.  LABS: CBC  Recent Labs Lab 06/03/13 0650 06/04/13 1039 06/05/13 0628  WBC 13.5* 9.9 7.6  HGB  14.6 13.4 14.1  HCT 41.7 38.2* 39.7  PLT 191 178 198   Coag's  Recent Labs Lab 06/03/13 0515  INR 0.99   BMET  Recent Labs Lab 06/04/13 1039 06/05/13 0628 06/06/13 0735  NA 135* 137 138  K 4.3 4.0 3.9  CL 99 99 101  CO2 23 22 24   BUN 6 4* <3*  CREATININE 0.73 0.57 0.60  GLUCOSE 89 98 93   Electrolytes  Recent Labs Lab 06/04/13 1039 06/05/13 0628 06/06/13 0735  CALCIUM 8.4 9.2 9.2   Sepsis Markers  Recent Labs Lab 06/03/13 0550 06/05/13 0628  LATICACIDVEN 2.54* 1.6   Liver Enzymes  Recent Labs Lab 06/04/13 1039 06/05/13 0628 06/06/13 0735  AST 105* 51* 32  ALT 268* 204* 138*  ALKPHOS 101 102 90  BILITOT 3.2* 1.8* 1.4*  ALBUMIN 2.9* 3.1* 3.2*   Glucose  Recent Labs Lab 06/05/13 0001 06/05/13 1133 06/05/13 1744 06/06/13 0009 06/06/13 0537 06/06/13 1117  GLUCAP 90 100* 99 94 97 87   IMAGING: Dg Cholangiogram Operative  06/06/2013   CLINICAL DATA:  Cholangitis.  EXAM: INTRAOPERATIVE CHOLANGIOGRAM  TECHNIQUE: Cholangiographic images from the C-arm fluoroscopic device were submitted for interpretation post-operatively. Please see the procedural report for the amount of contrast and the fluoroscopy time utilized.  COMPARISON:  ERCP 06/03/2013  FINDINGS: An intraoperative cholangiogram was attempted. There is contrast extravasation in the right upper quadrant. Contrast did not fill the  biliary system.  IMPRESSION: Attempted intraoperative cholangiogram. No significant opacification of the biliary system.   Electronically Signed   By: Markus Daft M.D.   On: 06/06/2013 16:16   ASSESSMENT / PLAN:  COPD / emphysema, no evidence of exacerbation Possible antitrypsin deficiency Tobacco use disorder S/p laparoscopic cholecystectomy   Supplemental oxygen  Pulmonary hygiene, IS, flutter valve  A1AT level and phenotype pending.  PFT's as an outpatient  DuoNebs q6 with Albuterol q3 PRN  Transition to LABA/LAMA before discharge  Smoking cessation  emphasized   Montey Hora, PA - [facility] Pgr: [phone]  or [phone]  I have personally obtained history, examined patient, evaluated and interpreted laboratory and imaging results, reviewed medical records, formulated assessment / plan and placed orders.  Doree Fudge, MD [facility] and [place] Pager: [phone]  06/06/2013, 8:33 PM

## 2013-06-06 NOTE — Progress Notes (Signed)
PROGRESS NOTE  ZEBULIN SIEGEL SWN:[id number] DOB: 1956-02-23 DOA: 06/02/2013 PCP: No PCP Per Patient  HPI/Subjective: 58 yo male presenting to ED on 3/19 RUQ pain and tachycardia, sent from urgent care for further evaluation. Has nausea but no vomiting. Pain is sharp in quality and moderate in severity. No known history of gallstones or gallbladder disease. Is a heavy smoker with history of COPD reporting mild dyspnea.   Subjective: Denies any complaints today. Sister at bedside.  Assessment/Plan:  Sepsis  -Likely from biliary infection -Patient currently stable.  Acute Ascending Cholangitis - CT abdomen/ pelvis: Mild intrahepatic biliary ductal dilatation & fatty infiltration of the liver. - US abdomen: (-) gallstones, mild CBD dilation, increased echogenicity of liver - on Zosyn since admission - ERCP done with extraction of multiple CBD stones, sphincterotomy and some pus. - Gen. surgery performed cholecystectomy on 3/23  COPD/severe bullous emphysema  - DG chest: diffuse emphysematous change, with numerous chronic bullae - appreciate pulmonology evaluation.   preop risk stratification as patient has very severe disease. - Alpha1 antitrypsin level and phenotype pending. - Continue nebulizer therapy.  Will transition to LABA/ LAMA when appropriate after surgery.  Hypertension  - stable on hydralazine prn.  Will resume Vasotec when appropriate.  Hyperlipidemia - hold statins due to elevated LFTs.  Depression  - continue risperidone and remeron.   DVT Prophylaxis:  SCDs  Code Status: full Family Communication: none at bedside  Disposition Plan: remain inpatient.    Consultants:  GI  Surgery    Objective: Filed Vitals:   06/06/13 0540 06/06/13 0729 06/06/13 1114 06/06/13 1242  BP: 143/87 123/82 99/64   Pulse: 76 107 119   Temp: 98.2 F (36.8 C) 97.3 F (36.3 C) 97.3 F (36.3 C) 97.5 F (36.4 C)  TempSrc: Oral Oral Oral   Resp: 18 18 18    Height:       Weight:      SpO2: 93% 93% 93%     Intake/Output Summary (Last 24 hours) at 06/06/13 1412 Last data filed at 06/06/13 0900  Gross per 24 hour  Intake      0 ml  Output   2825 ml  Net  -2825 ml   Filed Weights   06/02/13 2056 06/04/13 0447 06/05/13 0458  Weight: 83.915 kg (185 lb) 75.524 kg (166 lb 8 oz) 64.547 kg (142 lb 4.8 oz)    Exam: General: Well-developed and moderately nourished. Lying comfortably in bed. Eyes: PERRLA, anicteric ENT: mmm Neck: No mass felt. supple Cardiovascular: S1-S2 heard. RRR Respiratory: expiratory wheeze heard on the left, but no accessory muscle use. Abdomen: soft, no tenderness, guarding or rigidity. Non tender, no masses Skin: No rash.  Musculoskeletal: No edema.  Psychiatric: flat affect, 1 word appropriate answers  Neurologic: Alert awake oriented to time place and person. Moves all extremities.   Data Reviewed: Basic Metabolic Panel:  Recent Labs Lab 06/02/13 2106 06/03/13 0650 06/04/13 1039 06/05/13 0628 06/06/13 0735  NA 135* 135* 135* 137 138  K 3.9 4.3 4.3 4.0 3.9  CL 94* 97 99 99 101  CO2 25 23 23 22 24   GLUCOSE 150* 137* 89 98 93  BUN 6 7 6  4* <3*  CREATININE 0.59 0.59 0.73 0.57 0.60  CALCIUM 9.5 8.6 8.4 9.2 9.2   Liver Function Tests:  Recent Labs Lab 06/02/13 2106 06/03/13 0650 06/04/13 1039 06/05/13 0628 06/06/13 0735  AST 554* 550* 105* 51* 32  ALT 349* 513* 268* 204* 138*  ALKPHOS 110 115  101 102 90  BILITOT 2.6* 3.9* 3.2* 1.8* 1.4*  PROT 7.9 6.6 6.3 6.9 6.8  ALBUMIN 4.0 3.2* 2.9* 3.1* 3.2*    Recent Labs Lab 06/02/13 2106  LIPASE 18   CBC:  Recent Labs Lab 06/02/13 2106 06/03/13 0650 06/04/13 1039 06/05/13 0628  WBC 14.4* 13.5* 9.9 7.6  NEUTROABS 12.8* 12.1* 7.6  --   HGB 16.2 14.6 13.4 14.1  HCT 44.8 41.7 38.2* 39.7  MCV 87.0 87.4 87.8 87.1  PLT 237 191 178 198   CBG:  Recent Labs Lab 06/05/13 1133 06/05/13 1744 06/06/13 0009 06/06/13 0537 06/06/13 1117  GLUCAP 100* 99  94 97 87     Studies: No results found.  Scheduled Meds: . [MAR HOLD] budesonide (PULMICORT) nebulizer solution  0.25 mg Nebulization BID  . [MAR HOLD] enoxaparin (LOVENOX) injection  40 mg Subcutaneous Q24H  . [MAR HOLD] ipratropium-albuterol  3 mL Nebulization Q6H  . [facility] HOLD] mirtazapine  30 mg Oral Daily  . [MAR HOLD] pantoprazole  40 mg Oral Daily  . [MAR HOLD] piperacillin-tazobactam (ZOSYN)  IV  3.375 g Intravenous 3 times per day  . piperacillin-tazobactam  3.375 g Intravenous To OR  . [MAR HOLD] risperidone  4 mg Oral BID  . [facility] HOLD] sodium chloride  3 mL Intravenous Q12H   Continuous Infusions: . lactated ringers 75 mL/hr at 06/06/13 0210    Principal Problem:   Sepsis Active Problems:   Abdominal pain   Elevated LFTs   HTN (hypertension)   HLD (hyperlipidemia)   COPD (chronic obstructive pulmonary disease)   Calculus of bile duct without mention of cholecystitis or obstruction    Karen Kitchens [facility] Pager [phone]. If 7PM-7AM, please contact night-coverage at [email or web address], password [facility] 06/06/2013, 2:12 PM  LOS: 4 days

## 2013-06-06 NOTE — Op Note (Signed)
06/02/2013 - 06/06/2013  2:33 PM  PATIENT:  Blake Mcknight  58 y.o. male  PRE-OPERATIVE DIAGNOSIS:  CHOLANGITIS  POST-OPERATIVE DIAGNOSIS:  CHOLANGITIS  PROCEDURE:  Procedure(s): LAPAROSCOPIC CHOLECYSTECTOMY WITH ATTEMPTED INTRAOPERATIVE CHOLANGIOGRAM  SURGEON:  Surgeon(s): Zenovia Jarred, MD  ASSISTANTS: none   ANESTHESIA:   local and general  EBL:  Total I/O In: 1000 [I.V.:1000] Out: 350 [Urine:300; Blood:50]  BLOOD ADMINISTERED:none  DRAINS: none   SPECIMEN:  Excision  DISPOSITION OF SPECIMEN:  PATHOLOGY  COUNTS:  YES  DICTATION: .Dragon Dictation  Patient is status post ERCP for cholangitis. He is brought for cholecystectomy. He is on IV antibiotic protocol he was given a new dose. He was identified in the preop holding area. Informed consent was obtained earlier from his sister. He was brought to the operating room and general endotracheal anesthesia was administered by the anesthesia staff. His abdomen was prepped and draped in sterile fashion. We did a time out procedure. Infraumbilical region was infiltrated with quarter percent Marcaine with epinephrine. Infraumbilical incision was made. Subcutaneous tissues were dissected down revealing the anterior fascia. This was divided along the midline. Peritoneal cavity was entered under direct vision without difficulty. 0 Vicryl pursestring was placed around the fascial opening. Hassan trocar was inserted. Abdomen was insufflated with carbon monoxide in standard fashion. Under direct vision, a 5 mm epigastric and 5 mm right abdominal ports were placed. Local was used at each port site. Laparoscopic exploration revealed an inflamed gallbladder. The dome was retracted superior medially. The infundibulum was retracted inferolaterally. Dissection began laterally and progressed medially easily identifying the cystic duct. Dissection continued until critical view between the cystic duct, the liver, gallbladder. We also identified an  anterior branch of the cystic artery. This was dissected,  clipped twice proximally once distally and divided. Attention was directed back to the cystic duct. A clip was placed on the infundibular cystic duct junction. A small nick was made in the cystic duct. I attempted to place a cholangiogram catheter but could not get it to seal correctly. Cholangiogram was abandoned. 3 clips were placed proximally on the cystic duct and it was divided. Further dissection revealed the posterior branch of the cystic artery. This was clipped twice proximally and divided. Gallbladder was taken off the liver bed with a Bovie cautery. There was good hemostasis along the way. Gallbladder was placed in an Envirosac bag and removed from the abdomen via the umbilical port site. Liver bed was irrigated. Hemostasis was ensured with cautery. Clips remained in good position. Area was copiously irrigated and irrigation fluid returned clear. Liver bed was dry. Remainder of irrigation was evacuated. Ports were removed under direct vision. Pneumoperitoneum was released. Informed local fascia was closed by tying the pursestring. All 4 wounds were copiously irrigated and the skin of each was closed with running 4 Vicryl followed by Dermabond. All counts were correct. Patient tolerated procedure well without apparent complication and was taken recovery in stable condition.  PATIENT DISPOSITION:  PACU - hemodynamically stable.   Delay start of Pharmacological VTE agent (>24hrs) due to surgical blood loss or risk of bleeding:  no  Georganna Skeans, MD, MPH, FACS Pager: [phone]  3/23/20152:33 PM

## 2013-06-06 NOTE — Anesthesia Preprocedure Evaluation (Addendum)
Anesthesia Evaluation  Patient identified by MRN, date of birth, ID band Patient awake    Reviewed: Allergy & Precautions, H&P , NPO status , Patient's Chart, lab work & pertinent test results  History of Anesthesia Complications Negative for: history of anesthetic complications  Airway Mallampati: II TM Distance: >3 FB Neck ROM: Full    Dental  (+) Edentulous Upper, Poor Dentition, Missing, Chipped, Dental Advisory Given   Pulmonary COPD (last inhaler needed a few weeks ago)Current Smoker,  breath sounds clear to auscultation        Cardiovascular hypertension, Pt. on medications Rhythm:Regular Rate:Normal     Neuro/Psych negative neurological ROS     GI/Hepatic GERD-  Medicated and Controlled,Elevated LFTs with acute chole S/p ERCP 06/03/13   Endo/Other  negative endocrine ROS  Renal/GU negative Renal ROS     Musculoskeletal   Abdominal   Peds  Hematology   Anesthesia Other Findings   Reproductive/Obstetrics                         Anesthesia Physical Anesthesia Plan  ASA: II  Anesthesia Plan: General   Post-op Pain Management:    Induction: Intravenous  Airway Management Planned: Oral ETT  Additional Equipment:   Intra-op Plan:   Post-operative Plan: Extubation in OR  Informed Consent: I have reviewed the patients History and Physical, chart, labs and discussed the procedure including the risks, benefits and alternatives for the proposed anesthesia with the patient or authorized representative who has indicated his/her understanding and acceptance.   Dental advisory given  Plan Discussed with: Surgeon and CRNA  Anesthesia Plan Comments: (Plan routine monitors, GETA)        Anesthesia Quick Evaluation

## 2013-06-06 NOTE — Anesthesia Postprocedure Evaluation (Signed)
  Anesthesia Post-op Note  Patient: Blake Mcknight  Procedure(s) Performed: Procedure(s): LAPAROSCOPIC CHOLECYSTECTOMY WITH ATTEMPTED INTRAOPERATIVE CHOLANGIOGRAM (N/A)  Patient Location: PACU  Anesthesia Type:General  Level of Consciousness: awake, alert  and oriented  Airway and Oxygen Therapy: Patient Spontanous Breathing  Post-op Pain: mild  Post-op Assessment: Post-op Vital signs reviewed, Patient's Cardiovascular Status Stable, Respiratory Function Stable, Patent Airway, No signs of Nausea or vomiting and Pain level controlled  Post-op Vital Signs: Reviewed and stable  Complications: No apparent anesthesia complications 

## 2013-06-06 NOTE — Preoperative (Signed)
Beta Blockers   Reason not to administer Beta Blockers:Not Applicable 

## 2013-06-07 ENCOUNTER — Encounter (HOSPITAL_COMMUNITY): Payer: Self-pay | Admitting: General Surgery

## 2013-06-07 LAB — GLUCOSE, CAPILLARY
GLUCOSE-CAPILLARY: 94 mg/dL (ref 70–99)
GLUCOSE-CAPILLARY: 112 mg/dL — AB (ref 70–99)
GLUCOSE-CAPILLARY: 109 mg/dL — AB (ref 70–99)
GLUCOSE-CAPILLARY: 107 mg/dL — AB (ref 70–99)

## 2013-06-07 MED ORDER — ALBUTEROL SULFATE (2.5 MG/3ML) 0.083% IN NEBU
2.5000 mg | INHALATION_SOLUTION | RESPIRATORY_TRACT | Status: DC | PRN
  Administered 2013-06-08: 2.5 mg via RESPIRATORY_TRACT
  Filled 2013-06-07: qty 3

## 2013-06-07 MED ORDER — TIOTROPIUM BROMIDE MONOHYDRATE 18 MCG IN CAPS
18.0000 ug | ORAL_CAPSULE | Freq: Every day | RESPIRATORY_TRACT | Status: DC
  Administered 2013-06-08: 18 ug via RESPIRATORY_TRACT
  Filled 2013-06-07: qty 5

## 2013-06-07 MED ORDER — BUDESONIDE-FORMOTEROL FUMARATE 160-4.5 MCG/ACT IN AERO
2.0000 | INHALATION_SPRAY | Freq: Two times a day (BID) | RESPIRATORY_TRACT | Status: DC
  Administered 2013-06-07 – 2013-06-08 (×2): 2 via RESPIRATORY_TRACT
  Filled 2013-06-07: qty 6

## 2013-06-07 MED ORDER — ENALAPRIL MALEATE 10 MG PO TABS
10.0000 mg | ORAL_TABLET | Freq: Every day | ORAL | Status: DC
  Administered 2013-06-07 – 2013-06-08 (×2): 10 mg via ORAL
  Filled 2013-06-07 (×2): qty 1

## 2013-06-07 MED ORDER — MOMETASONE FURO-FORMOTEROL FUM 200-5 MCG/ACT IN AERO: 2.0000 | INHALATION_SPRAY | Freq: Two times a day (BID) | RESPIRATORY_TRACT | Status: DC

## 2013-06-07 NOTE — Progress Notes (Signed)
Addendum  Patient seen and examined, chart and data base reviewed.  I agree with the above assessment and plan.  For full details please see Mrs. Imogene Burn PA note.  Acute cholangitis, S/p ERCP and acute cholecystitis.  Fever earlier today, continue Abx, likely to be D/C in AM. Abx for 14 days on D/C.   Birdie Hopes, MD [facility] Pager: [phone] 06/07/2013, 2:54 PM

## 2013-06-07 NOTE — Anesthesia Postprocedure Evaluation (Signed)
  Anesthesia Post-op Note  Patient: Blake Mcknight  Procedure(s) Performed: Procedure(s): LAPAROSCOPIC CHOLECYSTECTOMY WITH ATTEMPTED INTRAOPERATIVE CHOLANGIOGRAM (N/A)  Patient Location: PACU  Anesthesia Type:General  Level of Consciousness: awake, alert  and oriented  Airway and Oxygen Therapy: Patient Spontanous Breathing  Post-op Pain: mild  Post-op Assessment: Post-op Vital signs reviewed, Patient's Cardiovascular Status Stable, Respiratory Function Stable, Patent Airway, No signs of Nausea or vomiting and Pain level controlled  Post-op Vital Signs: Reviewed and stable  Complications: No apparent anesthesia complications

## 2013-06-07 NOTE — Progress Notes (Addendum)
PULMONARY / CRITICAL CARE MEDICINE  Name: Blake Mcknight MRN: [id number] DOB: 23-Jun-1955    ADMISSION DATE:  06/02/2013 CONSULTATION DATE:  06/05/2013  REFERRING MD :  [facility] PRIMARY SERVICE:  [facility]  REASON FOR CONSULT:  Pre op evaluation  BRIEF PATIENT DESCRIPTION:  58 yo active smoker with emphysema presentedwith acute RUQ abd pain and found to have cholangitis and common bile duct stones.  Underwent ERCP.  PCCM asked to evaluate prior to laparoscopic cholecystectomy.   SIGNIFICANT EVENTS / STUDIES:  3/19  Presented with RUQ abdominal pain 3/20  ERCP, balloon extraction, sphincterotomy 3/23  Laparoscopic cholecystectomy   LINES / TUBES:  CULTURES: 3/20  Blood >>>  ANTIBIOTICS: Zosyn 3/20 >>>   SUBJECTIVE:  Doing well post-op, no acute events overnight.  Pain well controlled.  Vitals stable.  Has tolerated liquids so far, no solids yet.  No BM yet.  VITAL SIGNS: Temp:  [97.3 F (36.3 C)-100.1 F (37.8 C)] 100.1 F (37.8 C) (03/24 0513) Pulse Rate:  [82-119] 95 (03/24 0513) Resp:  [15-18] 18 (03/24 0513) BP: (99-168)/(64-94) 121/71 mmHg (03/24 0513) SpO2:  [90 %-100 %] 96 % (03/24 0513) HEMODYNAMICS:   VENTILATOR SETTINGS:   INTAKE / OUTPUT: Intake/Output     03/23 0701 - 03/24 0700 03/24 0701 - 03/25 0700   P.O.     I.V. (mL/kg) 1913.3 (29.6)    Total Intake(mL/kg) 1913.3 (29.6)    Urine (mL/kg/hr) 1350 (0.9)    Blood 50 (0)    Total Output 1400     Net +513.3           PHYSICAL EXAMINATION: General: Sitting on side of bed eating breakfast, in NAD. Neuro: Non-focal.  HEENT: [place]/AT. PERRL, sclerae anicteric. Cardiovascular: tachy but regular, no M/R/G.  Lungs: Respirations even and unlabored although shallow.  Diffuse wheezing, scattered rhonchi. Abdomen: BS x 4, soft, NT/ND.  Musculoskeletal: No gross deformities, no edema.  Skin: Intact, warm, no rashes.  LABS: CBC  Recent Labs Lab 06/03/13 0650 06/04/13 1039 06/05/13 0628  WBC 13.5* 9.9 7.6  HGB  14.6 13.4 14.1  HCT 41.7 38.2* 39.7  PLT 191 178 198   Coag's  Recent Labs Lab 06/03/13 0515  INR 0.99   BMET  Recent Labs Lab 06/04/13 1039 06/05/13 0628 06/06/13 0735  NA 135* 137 138  K 4.3 4.0 3.9  CL 99 99 101  CO2 23 22 24   BUN 6 4* <3*  CREATININE 0.73 0.57 0.60  GLUCOSE 89 98 93   Electrolytes  Recent Labs Lab 06/04/13 1039 06/05/13 0628 06/06/13 0735  CALCIUM 8.4 9.2 9.2   Sepsis Markers  Recent Labs Lab 06/03/13 0550 06/05/13 0628  LATICACIDVEN 2.54* 1.6   Liver Enzymes  Recent Labs Lab 06/04/13 1039 06/05/13 0628 06/06/13 0735  AST 105* 51* 32  ALT 268* 204* 138*  ALKPHOS 101 102 90  BILITOT 3.2* 1.8* 1.4*  ALBUMIN 2.9* 3.1* 3.2*   Glucose  Recent Labs Lab 06/06/13 0009 06/06/13 0537 06/06/13 1117 06/06/13 1805 06/06/13 2339 06/07/13 0623  GLUCAP 94 97 87 99 112* 94   IMAGING: Dg Cholangiogram Operative  06/06/2013   CLINICAL DATA:  Cholangitis.  EXAM: INTRAOPERATIVE CHOLANGIOGRAM  TECHNIQUE: Cholangiographic images from the C-arm fluoroscopic device were submitted for interpretation post-operatively. Please see the procedural report for the amount of contrast and the fluoroscopy time utilized.  COMPARISON:  ERCP 06/03/2013  FINDINGS: An intraoperative cholangiogram was attempted. There is contrast extravasation in the right upper quadrant. Contrast did not fill  the biliary system.  IMPRESSION: Attempted intraoperative cholangiogram. No significant opacification of the biliary system.   Electronically Signed   By: Markus Daft M.D.   On: 06/06/2013 16:16   ASSESSMENT / PLAN:  COPD / emphysema, no evidence of exacerbation Possible alpha 1 antitrypsin deficiency Tobacco use disorder S/p laparoscopic cholecystectomy   Supplemental oxygen  Pulmonary hygiene, IS, flutter valve  A1AT level and phenotype pending  PFT's as an outpatient  D/c Budesonide / DuoNebs  Start albuterol PRN  Start Ruthe Mannan / Spiriva  Smoking cessation  emphasized   Please set up with [facility] upon discharge  Montey Hora, [facility] Pgr: [phone]  or [phone]  I have personally obtained history, examined patient, evaluated and interpreted laboratory and imaging results, reviewed medical records, formulated assessment / plan and placed orders.  Doree Fudge, MD [facility] [place] Pager: [phone]  06/07/2013, 10:38 AM

## 2013-06-07 NOTE — Progress Notes (Signed)
1 Day Post-Op  Subjective: Feeling better after surgery. Excited about the prospect of solid food and possibly going home. Breathing seems slightly labored but sister at bedside states this is normal with his COPD, and it's better than she expected it to be after surgery.  Objective: Vital signs in last 24 hours: Temp:  [97.3 F (36.3 C)-100.1 F (37.8 C)] 100.1 F (37.8 C) (03/24 0513) Pulse Rate:  [82-119] 95 (03/24 0513) Resp:  [15-18] 18 (03/24 0513) BP: (99-168)/(64-94) 121/71 mmHg (03/24 0513) SpO2:  [90 %-100 %] 96 % (03/24 0513) Last BM Date: 06/06/13  Intake/Output from previous day: 03/23 0701 - 03/24 0700 In: 1913.3 [I.V.:1913.3] Out: 1400 [Urine:1350; Blood:50] Intake/Output this shift:    PE: Gen:  Alert, NAD, pleasant Card:  RRR, no M/G/R heard Pulm: Slightly labored. Coarse breath sounds bilaterally anterior, clear posterior. IS to 1000 with minimal coaching. No wheezing or rales. Abd: Soft, minimally tender, ND, +BS, incisions C/D/I Ext:  No erythema, edema, or tenderness. SCDs in place. 2+ DP pulse.  Lab Results:   Recent Labs  06/04/13 1039 06/05/13 0628  WBC 9.9 7.6  HGB 13.4 14.1  HCT 38.2* 39.7  PLT 178 198   BMET  Recent Labs  06/05/13 0628 06/06/13 0735  NA 137 138  K 4.0 3.9  CL 99 101  CO2 22 24  GLUCOSE 98 93  BUN 4* <3*  CREATININE 0.57 0.60  CALCIUM 9.2 9.2   PT/INR No results found for this basename: LABPROT, INR,  in the last 72 hours CMP     Component Value Date/Time   NA 138 06/06/2013 0735   K 3.9 06/06/2013 0735   CL 101 06/06/2013 0735   CO2 24 06/06/2013 0735   GLUCOSE 93 06/06/2013 0735   BUN <3* 06/06/2013 0735   CREATININE 0.60 06/06/2013 0735   CALCIUM 9.2 06/06/2013 0735   PROT 6.8 06/06/2013 0735   ALBUMIN 3.2* 06/06/2013 0735   AST 32 06/06/2013 0735   ALT 138* 06/06/2013 0735   ALKPHOS 90 06/06/2013 0735   BILITOT 1.4* 06/06/2013 0735   GFRNONAA >90 06/06/2013 0735   GFRAA >90 06/06/2013 0735   Lipase      Component Value Date/Time   LIPASE 18 06/02/2013 2106       Studies/Results: Dg Cholangiogram Operative  06/06/2013   CLINICAL DATA:  Cholangitis.  EXAM: INTRAOPERATIVE CHOLANGIOGRAM  TECHNIQUE: Cholangiographic images from the C-arm fluoroscopic device were submitted for interpretation post-operatively. Please see the procedural report for the amount of contrast and the fluoroscopy time utilized.  COMPARISON:  ERCP 06/03/2013  FINDINGS: An intraoperative cholangiogram was attempted. There is contrast extravasation in the right upper quadrant. Contrast did not fill the biliary system.  IMPRESSION: Attempted intraoperative cholangiogram. No significant opacification of the biliary system.   Electronically Signed   By: Markus Daft M.D.   On: 06/06/2013 16:16    Anti-infectives: Anti-infectives   Start     Dose/Rate Route Frequency Ordered Stop   06/06/13 1345  piperacillin-tazobactam (ZOSYN) IVPB 3.375 g  Status:  Discontinued     3.375 g 100 mL/hr over 30 Minutes Intravenous To Surgery 06/06/13 1343 06/06/13 1542   06/03/13 1200  piperacillin-tazobactam (ZOSYN) IVPB 3.375 g     3.375 g 12.5 mL/hr over 240 Minutes Intravenous 3 times per day 06/03/13 0623     06/03/13 1100  Ampicillin-Sulbactam (UNASYN) 3 g in sodium chloride 0.9 % 100 mL IVPB  Status:  Discontinued     3 g 100  mL/hr over 60 Minutes Intravenous  Once 06/03/13 1046 06/03/13 1047   06/03/13 0445  piperacillin-tazobactam (ZOSYN) IVPB 3.375 g     3.375 g 12.5 mL/hr over 240 Minutes Intravenous  Once 06/03/13 0433 06/03/13 0908   06/03/13 0215  cefTRIAXone (ROCEPHIN) 1 g in dextrose 5 % 50 mL IVPB     1 g 100 mL/hr over 30 Minutes Intravenous  Once 06/03/13 0213 06/03/13 0539       Assessment/Plan S/p lap chole 06/06/13 COPD HTN Sepsis Acute cholangitis s/p ERCP 06/04/13   1. Pain improved s/p lap chole 2. Ambulate and IS 3. Lovenox and SCDs for VTE proph Dispo: Stable for d/c if continues tolerating diet and  remains afebrile.   LOS: 5 days    Legrand Como A. Tillery, [place] 06/07/2013, 8:06 [facility] Phone #: [phone]

## 2013-06-07 NOTE — Progress Notes (Signed)
PROGRESS NOTE  Blake Mcknight JKK:[id number] DOB: Jul 11, 1955 DOA: 06/02/2013 PCP: No PCP Per Patient  HPI/Subjective: 58 yo male with pmh of COPD with bullous emphysema, HTN, HLD,  presented to ED on 3/19 with RUQ pain and tachycardia.  He was sent from urgent care for further evaluation. Has nausea but no vomiting. Pain is sharp in quality and moderate in severity. He has no known history of gallstones or gallbladder disease. Is a heavy smoker with history of COPD reporting mild dyspnea.   Subjective: Denies pain.  Sister at bedside.  Ate approximately 50% of his clear liquids.  Assessment/Plan:  Sepsis  -Likely from biliary infection -Patient currently stable.  Experienced low grade fever over night. -clinically, patient is much improved.  Acute Ascending Cholangitis - Clinically resolved. - CT abdomen/ pelvis: Mild intrahepatic biliary ductal dilatation & fatty infiltration of the liver. - US abdomen: (-) gallstones, mild CBD dilation, increased echogenicity of liver - on Zosyn since admission.  Will likely d/c antibiotics at discharge 3/25 (7 days) - ERCP done with extraction of multiple CBD stones, sphincterotomy and some pus. - Gen. surgery performed cholecystectomy on 3/23.  Management per surgery.   COPD/severe bullous emphysema  - DG chest: diffuse emphysematous change, with numerous chronic bullae - appreciate pulmonology evaluation.   preop risk stratification completed as patient has very severe disease. - Alpha1 antitrypsin level and phenotype pending. - spiriva and symbicort started by pulmonology. - Will follow with pulmonology after discharge.  Hypertension  - stable on hydralazine prn.  Vasotec resumed.  Hyperlipidemia - hold statins due to elevated LFTs.  Will resume on discharge.  Depression  - continue risperidone and remeron.   DVT Prophylaxis:  SCDs  Code Status: full Family Communication: sister at bedside. Disposition Plan: remain  inpatient. Hopefully home tomorrow.  (Lives with his son)   Consultants:  GI  Surgery    Objective: Filed Vitals:   06/07/13 0942 06/07/13 1044 06/07/13 1049 06/07/13 1133  BP:      Pulse:      Temp:  98.1 F (36.7 C)  98.2 F (36.8 C)  TempSrc:    Oral  Resp:      Height:      Weight:      SpO2: 98% 91% 94% 95%    Intake/Output Summary (Last 24 hours) at 06/07/13 1415 Last data filed at 06/07/13 1200  Gross per 24 hour  Intake 1033.33 ml  Output   1575 ml  Net -541.67 ml   Filed Weights   06/02/13 2056 06/04/13 0447 06/05/13 0458  Weight: 83.915 kg (185 lb) 75.524 kg (166 lb 8 oz) 64.547 kg (142 lb 4.8 oz)    Exam: General: Well-developed and moderately nourished. Lying comfortably in bed.  Eyes: PERRLA, anicteric ENT: mmm Neck: No mass felt. supple Cardiovascular: S1-S2 heard. RRR Respiratory: CTA, no w/c/r Abdomen: soft, no tenderness, guarding or rigidity. Non tender, no masses Skin: No rash.  Musculoskeletal: No edema.  Psychiatric: smiles, answers questions with 1 word.  Child like. Neurologic: Alert awake oriented to time place and person. Moves all extremities.   Data Reviewed: Basic Metabolic Panel:  Recent Labs Lab 06/02/13 2106 06/03/13 0650 06/04/13 1039 06/05/13 0628 06/06/13 0735  NA 135* 135* 135* 137 138  K 3.9 4.3 4.3 4.0 3.9  CL 94* 97 99 99 101  CO2 25 23 23 22 24   GLUCOSE 150* 137* 89 98 93  BUN 6 7 6  4* <3*  CREATININE 0.59 0.59 0.73 0.57  0.60  CALCIUM 9.5 8.6 8.4 9.2 9.2   Liver Function Tests:  Recent Labs Lab 06/02/13 2106 06/03/13 0650 06/04/13 1039 06/05/13 0628 06/06/13 0735  AST 554* 550* 105* 51* 32  ALT 349* 513* 268* 204* 138*  ALKPHOS 110 115 101 102 90  BILITOT 2.6* 3.9* 3.2* 1.8* 1.4*  PROT 7.9 6.6 6.3 6.9 6.8  ALBUMIN 4.0 3.2* 2.9* 3.1* 3.2*    Recent Labs Lab 06/02/13 2106  LIPASE 18   CBC:  Recent Labs Lab 06/02/13 2106 06/03/13 0650 06/04/13 1039 06/05/13 0628  WBC 14.4* 13.5*  9.9 7.6  NEUTROABS 12.8* 12.1* 7.6  --   HGB 16.2 14.6 13.4 14.1  HCT 44.8 41.7 38.2* 39.7  MCV 87.0 87.4 87.8 87.1  PLT 237 191 178 198   CBG:  Recent Labs Lab 06/06/13 1117 06/06/13 1805 06/06/13 2339 06/07/13 0623 06/07/13 1200  GLUCAP 87 99 112* 94 112*     Studies: Dg Cholangiogram Operative  06/06/2013   CLINICAL DATA:  Cholangitis.  EXAM: INTRAOPERATIVE CHOLANGIOGRAM  TECHNIQUE: Cholangiographic images from the C-arm fluoroscopic device were submitted for interpretation post-operatively. Please see the procedural report for the amount of contrast and the fluoroscopy time utilized.  COMPARISON:  ERCP 06/03/2013  FINDINGS: An intraoperative cholangiogram was attempted. There is contrast extravasation in the right upper quadrant. Contrast did not fill the biliary system.  IMPRESSION: Attempted intraoperative cholangiogram. No significant opacification of the biliary system.   Electronically Signed   By: Markus Daft M.D.   On: 06/06/2013 16:16    Scheduled Meds: . budesonide-formoterol  2 puff Inhalation BID  . enalapril  10 mg Oral Daily  . enoxaparin (LOVENOX) injection  40 mg Subcutaneous Q24H  . mirtazapine  30 mg Oral Daily  . pantoprazole  40 mg Oral Daily  . piperacillin-tazobactam (ZOSYN)  IV  3.375 g Intravenous 3 times per day  . risperidone  4 mg Oral BID  . sodium chloride  3 mL Intravenous Q12H  . tiotropium  18 mcg Inhalation Daily   Continuous Infusions: . sodium chloride 100 mL/hr (06/07/13 1050)    Principal Problem:   Sepsis Active Problems:   Abdominal pain   Elevated LFTs   HTN (hypertension)   HLD (hyperlipidemia)   COPD (chronic obstructive pulmonary disease)   Calculus of bile duct without mention of cholecystitis or obstruction   Tobacco use disorder   Possible Alpha-1-antitrypsin deficiency    Karen Kitchens [facility] Pager [phone]. If 7PM-7AM, please contact night-coverage at [email or web address], password [facility] 06/07/2013,  2:15 PM  LOS: 5 days

## 2013-06-07 NOTE — Progress Notes (Signed)
Doing well post-op. Abdomen soft. Voiding without problems. Patient examined and I agree with the assessment and plan  Georganna Skeans, MD, MPH, FACS Trauma: [phone] General Surgery: [phone]  06/07/2013 4:40 PM

## 2013-06-08 LAB — CBC
HCT: 37.5 % — ABNORMAL LOW (ref 39.0–52.0)
HEMOGLOBIN: 13.2 g/dL (ref 13.0–17.0)
MCH: 30.7 pg (ref 26.0–34.0)
MCHC: 35.2 g/dL (ref 30.0–36.0)
MCV: 87.2 fL (ref 78.0–100.0)
Platelets: 221 10*3/uL (ref 150–400)
RBC: 4.3 MIL/uL (ref 4.22–5.81)
RDW: 14.1 % (ref 11.5–15.5)
WBC: 9.5 10*3/uL (ref 4.0–10.5)

## 2013-06-08 LAB — GLUCOSE, CAPILLARY
Glucose-Capillary: 120 mg/dL — ABNORMAL HIGH (ref 70–99)
Glucose-Capillary: 100 mg/dL — ABNORMAL HIGH (ref 70–99)

## 2013-06-08 LAB — ALPHA-1-ANTITRYPSIN: A1 ANTITRYPSIN SER: 216 mg/dL — AB (ref 83–199)

## 2013-06-08 MED ORDER — TIOTROPIUM BROMIDE MONOHYDRATE 18 MCG IN CAPS: 18.0000 ug | ORAL_CAPSULE | Freq: Every day | RESPIRATORY_TRACT | Status: DC

## 2013-06-08 MED ORDER — BUDESONIDE-FORMOTEROL FUMARATE 160-4.5 MCG/ACT IN AERO: 2.0000 | INHALATION_SPRAY | Freq: Two times a day (BID) | RESPIRATORY_TRACT | Status: DC

## 2013-06-08 MED ORDER — AMOXICILLIN-POT CLAVULANATE 875-125 MG PO TABS: 1.0000 | ORAL_TABLET | Freq: Two times a day (BID) | ORAL | Status: DC

## 2013-06-08 MED ORDER — PNEUMOCOCCAL VAC POLYVALENT 25 MCG/0.5ML IJ INJ
0.5000 mL | INJECTION | Freq: Once | INTRAMUSCULAR | Status: AC
  Administered 2013-06-08: 0.5 mL via INTRAMUSCULAR
  Filled 2013-06-08: qty 0.5

## 2013-06-08 MED ORDER — NICOTINE 21 MG/24HR TD PT24: 21.0000 mg | MEDICATED_PATCH | Freq: Every day | TRANSDERMAL | Status: DC

## 2013-06-08 NOTE — Discharge Summary (Signed)
Patient was seen and examined, status post cholecystectomy, had a bowel movement early this morning, ambulating without a major issues. Tolerated a regular diet. Seen by general surgery today and cleared for discharge. No other acute issues and is stable for discharge.

## 2013-06-08 NOTE — Progress Notes (Signed)
2 Days Post-Op  Subjective: Feeling great today.  Tolerated solid food well.  Regular flatus and 1 x BM.    Objective: Vital signs in last 24 hours: Temp:  [97.7 F (36.5 C)-98.2 F (36.8 C)] 97.7 F (36.5 C) (03/25 0345) Pulse Rate:  [83-96] 83 (03/25 0345) Resp:  [18] 18 (03/25 0345) BP: (118-136)/(71-82) 136/82 mmHg (03/25 0345) SpO2:  [91 %-98 %] 98 % (03/25 0923) Last BM Date: 06/06/13  Intake/Output from previous day: 03/24 0701 - 03/25 0700 In: 2651.7 [P.O.:120; I.V.:2481.7; IV Piggyback:50] Out: 1600 [Urine:1600] Intake/Output this shift: Total I/O In: -  Out: 400 [Urine:400]  PE: Gen:  Alert, NAD, pleasant Card:  RRR, no M/G/R heard Pulm:  CTA, minimal expiratory wheezing. Non labored breathing. Due for meds per sister. Abd: Soft, minimally tender, ND, +BS, incisions C/D/I.   Lab Results:   Recent Labs  06/08/13 0542  WBC 9.5  HGB 13.2  HCT 37.5*  PLT 221   BMET  Recent Labs  06/06/13 0735  NA 138  K 3.9  CL 101  CO2 24  GLUCOSE 93  BUN <3*  CREATININE 0.60  CALCIUM 9.2   PT/INR No results found for this basename: LABPROT, INR,  in the last 72 hours CMP     Component Value Date/Time   NA 138 06/06/2013 0735   K 3.9 06/06/2013 0735   CL 101 06/06/2013 0735   CO2 24 06/06/2013 0735   GLUCOSE 93 06/06/2013 0735   BUN <3* 06/06/2013 0735   CREATININE 0.60 06/06/2013 0735   CALCIUM 9.2 06/06/2013 0735   PROT 6.8 06/06/2013 0735   ALBUMIN 3.2* 06/06/2013 0735   AST 32 06/06/2013 0735   ALT 138* 06/06/2013 0735   ALKPHOS 90 06/06/2013 0735   BILITOT 1.4* 06/06/2013 0735   GFRNONAA >90 06/06/2013 0735   GFRAA >90 06/06/2013 0735   Lipase     Component Value Date/Time   LIPASE 18 06/02/2013 2106       Studies/Results: Dg Cholangiogram Operative  06/06/2013   CLINICAL DATA:  Cholangitis.  EXAM: INTRAOPERATIVE CHOLANGIOGRAM  TECHNIQUE: Cholangiographic images from the C-arm fluoroscopic device were submitted for interpretation post-operatively.  Please see the procedural report for the amount of contrast and the fluoroscopy time utilized.  COMPARISON:  ERCP 06/03/2013  FINDINGS: An intraoperative cholangiogram was attempted. There is contrast extravasation in the right upper quadrant. Contrast did not fill the biliary system.  IMPRESSION: Attempted intraoperative cholangiogram. No significant opacification of the biliary system.   Electronically Signed   By: Markus Daft M.D.   On: 06/06/2013 16:16    Anti-infectives: Anti-infectives   Start     Dose/Rate Route Frequency Ordered Stop   06/06/13 1345  piperacillin-tazobactam (ZOSYN) IVPB 3.375 g  Status:  Discontinued     3.375 g 100 mL/hr over 30 Minutes Intravenous To Surgery 06/06/13 1343 06/06/13 1542   06/03/13 1200  piperacillin-tazobactam (ZOSYN) IVPB 3.375 g     3.375 g 12.5 mL/hr over 240 Minutes Intravenous 3 times per day 06/03/13 0623     06/03/13 1100  Ampicillin-Sulbactam (UNASYN) 3 g in sodium chloride 0.9 % 100 mL IVPB  Status:  Discontinued     3 g 100 mL/hr over 60 Minutes Intravenous  Once 06/03/13 1046 06/03/13 1047   06/03/13 0445  piperacillin-tazobactam (ZOSYN) IVPB 3.375 g     3.375 g 12.5 mL/hr over 240 Minutes Intravenous  Once 06/03/13 0433 06/03/13 0908   06/03/13 0215  cefTRIAXone (ROCEPHIN) 1 g in dextrose  5 % 50 mL IVPB     1 g 100 mL/hr over 30 Minutes Intravenous  Once 06/03/13 0213 06/03/13 0539       Assessment/Plan S/p lap chole 06/06/13  COPD  HTN  Sepsis  Acute cholangitis s/p ERCP 06/04/13  1. Pain well managed 2. Ambulate and IS  3. Lovenox and SCDs for VTE proph  Dispo: Stable for d/c  LOS: 6 days    Michael A. Tillery, [place] 06/08/2013, 10:55 AM [facility] Phone #: [phone]  I personally interviewed and examined the patient.  He is stable for discharge from surgical standpoint.  I reviewed his discharge instructions.  A follow up has been made on his behalf.

## 2013-06-08 NOTE — Addendum Note (Signed)
Addendum created 06/08/13 9977 by Rica Koyanagi, MD   Modules edited: Notes Section   Notes Section:  File: [id number]

## 2013-06-08 NOTE — Anesthesia Postprocedure Evaluation (Signed)
  Anesthesia Post-op Note  Patient: Blake Mcknight  Procedure(s) Performed: Procedure(s): LAPAROSCOPIC CHOLECYSTECTOMY WITH ATTEMPTED INTRAOPERATIVE CHOLANGIOGRAM (N/A)  Patient Location: PACU  Anesthesia Type:General  Level of Consciousness: awake and alert   Airway and Oxygen Therapy: Patient Spontanous Breathing  Post-op Pain: mild  Post-op Assessment: Post-op Vital signs reviewed  Post-op Vital Signs: stable  Complications: No apparent anesthesia complications

## 2013-06-08 NOTE — Progress Notes (Signed)
Patient discharge teaching given, including activity, diet, follow-up appoints, and medications. Patient verbalized understanding of all discharge instructions. IV access was d/c'd. Vitals are stable. Skin is intact except as charted in most recent assessments. Pt to be escorted out by NT, to be driven home by family. 

## 2013-06-08 NOTE — Discharge Summary (Signed)
Physician Discharge Summary  Blake Mcknight:[id number] DOB: [birth year] DOA: 06/02/2013  PCP: Leola Brazil, MD  Admit date: 06/02/2013 Discharge date: 06/08/2013  Time spent: 50 minutes  Recommendations for Outpatient Follow-up:  7 days of augmentin antibiotic Follow up with surgery and pulmonology as recommended.  Follow pending Alpha 1 Antitrypsin PCP to determine when to resume statin based on resolution of elevated LFTs.  Discharge Diagnoses:  Principal Problem:   Sepsis Active Problems:   Abdominal pain   Elevated LFTs   HTN (hypertension)   HLD (hyperlipidemia)   COPD (chronic obstructive pulmonary disease)   Calculus of bile duct without mention of cholecystitis or obstruction   Tobacco use disorder   Possible Alpha-1-antitrypsin deficiency   Discharge Condition: stable.  Diet recommendation: regular diet as tolerated.  Filed Weights   06/02/13 2056 06/04/13 0447 06/05/13 0458  Weight: 83.915 kg (185 lb) 75.524 kg (166 lb 8 oz) 64.547 kg (142 lb 4.8 oz)    History of present illness:  58 yo male with pmh of COPD with bullous emphysema, HTN, HLD, presented to ED on 3/19 with RUQ pain and tachycardia. He was sent from urgent care for further evaluation. Has nausea but no vomiting. Pain is sharp in quality and moderate in severity. He has no known history of gallstones or gallbladder disease. Is a heavy smoker with history of COPD reporting mild dyspnea.   Hospital Course:  Sepsis  -Likely from biliary infection .  Now resolved. -Treated with IV zosyn inpatient and will be discharged on augmentin.  Acute Ascending Cholangitis  - resolved.  - ERCP done with extraction of multiple CBD stones, sphincterotomy and some pus.  - Gen. surgery performed cholecystectomy on 3/23.  - CT abdomen/ pelvis: Mild intrahepatic biliary ductal dilatation & fatty infiltration of the liver.  - US abdomen: (-) gallstones, mild CBD dilation, increased echogenicity of liver    COPD/severe bullous emphysema  - DG chest: diffuse emphysematous change, with numerous chronic bullae  - appreciate pulmonology evaluation. preop risk stratification completed as patient has very severe disease.  - Alpha1 antitrypsin level and phenotype pending.  - spiriva and symbicort started by pulmonology.  - Will follow with pulmonology after discharge.   Hypertension  - stable on hydralazine prn. Vasotec resumed at discharge.  Hyperlipidemia  - hold statins due to elevated LFTs.  Will continue to hold at discharge.  PCP to determine when to resume.  Depression  - continue risperidone and remeron.      Procedures:  ERCP with Sphincterotomy by Dr. Fuller Plan  Cholecystectomy by Dr. Grandville Silos.  Consultations:  [facility] - general surgery  GI - [place]  Discharge Exam: Filed Vitals:   06/08/13 0345  BP: 136/82  Pulse: 83  Temp: 97.7 F (36.5 C)  Resp: 18    General: Well-developed and moderately nourished. Lying comfortably in bed. Sister at bedside Cardiovascular: S1-S2 heard. RRR  Respiratory: CTA, no w/c/r  Abdomen: soft, no tenderness, slight bloating, no guarding or rigidity. Non tender, incision sites healing well Skin: No rash.  Musculoskeletal: No edema.  Psychiatric: smiles, answers questions with 1 word. Child like.  Neurologic: Alert awake oriented to time place and person. Moves all extremities.   Discharge Instructions      Discharge Orders   Future Appointments Provider Department Dept Phone   06/16/2013 2:00 PM Melvenia Needles, NP [facility] [phone]   Future Orders Complete By Expires   Diet - low sodium heart healthy  As directed  Increase activity slowly  As directed        Medication List    STOP taking these medications       atorvastatin 10 MG tablet  Commonly known as:  LIPITOR     omeprazole 20 MG capsule  Commonly known as:  PRILOSEC      TAKE these medications       amoxicillin-clavulanate 875-125 MG per  tablet  Commonly known as:  AUGMENTIN  Take 1 tablet by mouth 2 (two) times daily.     B-12 PO  Take 1 tablet by mouth daily.     benztropine 2 MG tablet  Commonly known as:  COGENTIN  Take 1 tablet by mouth 3 (three) times daily.     budesonide-formoterol 160-4.5 MCG/ACT inhaler  Commonly known as:  SYMBICORT  Inhale 2 puffs into the lungs 2 (two) times daily.     cholecalciferol 1000 UNITS tablet  Commonly known as:  VITAMIN D  Take 1,000 Units by mouth daily.     enalapril 10 MG tablet  Commonly known as:  VASOTEC  Take 1 tablet by mouth daily.     famotidine 40 MG tablet  Commonly known as:  PEPCID  Take 1 tablet by mouth 2 (two) times daily.     HYDROcodone-acetaminophen 7.5-325 MG per tablet  Commonly known as:  NORCO  Take 1 tablet by mouth every 8 (eight) hours as needed.     mirtazapine 30 MG tablet  Commonly known as:  REMERON  Take 1 tablet by mouth daily.     multivitamin with minerals tablet  Take 1 tablet by mouth daily.     nicotine 21 mg/24hr patch  Commonly known as:  [employer] NICOTINE TRANSDERMAL SYS  Place 1 patch (21 mg total) onto the skin daily.     PROAIR HFA 108 (90 BASE) MCG/ACT inhaler  Generic drug:  albuterol  Inhale 2 puffs into the lungs 2 (two) times daily.     risperidone 4 MG tablet  Commonly known as:  RISPERDAL  Take 1 tablet by mouth 2 (two) times daily.     tiotropium 18 MCG inhalation capsule  Commonly known as:  SPIRIVA  Place 1 capsule (18 mcg total) into inhaler and inhale daily.     vitamin C 500 MG tablet  Commonly known as:  ASCORBIC ACID  Take 500 mg by mouth daily.       No Known Allergies Follow-up Information   Follow up with [facility] On 06/16/2013. (At 2:00 PM)    Specialty:  Pulmonology   Contact information:   [place] [address] [phone]      Follow up with [facility] On 06/28/2013. (arrive by 3:15PM for a 3:45pm post operative check)    Contact  information:   [address]   [place] [address] [phone]       Follow up with Leola Brazil, MD In 4 weeks.   Specialty:  Pulmonary Disease   Contact information:   [place] [address] [phone]        The results of significant diagnostics from this hospitalization (including imaging, microbiology, ancillary and laboratory) are listed below for reference.    Significant Diagnostic Studies: Dg Chest 2 View  06/02/2013   CLINICAL DATA:  Shortness of breath  EXAM: CHEST  2 VIEW  COMPARISON:  None. There is imaging from 2003 which is not available.  FINDINGS: The lungs are maximally hyperinflated. There is diffuse emphysematous change, with numerous bullae. The interstitium between the bullae is thickened. No available priors to establish stability. No cardiomegaly. No evidence of effusion or air leak. No asymmetric lung opacity to suggest consolidating pneumonia.  IMPRESSION: 1. Bullous emphysema. Due to the extensive disease and young age, alpha-1 antitrypsin deficiency should be considered. 2. Interstitial thickening which could be chronic or from interstitial edema. No cardiomegaly.   Electronically Signed   By: Jorje Guild M.D.   On: 06/02/2013 20:01   Dg Cholangiogram Operative  06/06/2013   CLINICAL DATA:  Cholangitis.  EXAM: INTRAOPERATIVE CHOLANGIOGRAM  TECHNIQUE: Cholangiographic images from the C-arm fluoroscopic device were submitted for interpretation post-operatively. Please see the procedural report for the amount of contrast and the fluoroscopy time utilized.  COMPARISON:  ERCP 06/03/2013  FINDINGS: An intraoperative cholangiogram was attempted. There is contrast extravasation in the right upper quadrant. Contrast did not fill the biliary system.  IMPRESSION: Attempted intraoperative cholangiogram. No significant opacification of the biliary system.   Electronically Signed   By: Markus Daft M.D.   On: 06/06/2013 16:16    US Abdomen Complete  06/03/2013   CLINICAL DATA:  Right upper quadrant pain.  EXAM: ULTRASOUND ABDOMEN COMPLETE  COMPARISON:  Abdominal ultrasound 09/12/2010.  FINDINGS: Gallbladder:  No gallstones or wall thickening visualized. No sonographic Murphy sign noted.  Common bile duct:  Diameter: 0.8 cm.  Liver:  Demonstrates increased echogenicity. No focal lesion is identified. There is intrahepatic biliary ductal dilatation.  IVC:  No abnormality visualized.  Pancreas:  Visualized portion unremarkable.  Spleen:  Size and appearance within normal limits.  Right Kidney:  Length: 11.9 cm. Echogenicity within normal limits. No mass or hydronephrosis visualized.  Left Kidney:  Length: 11.9 cm. Echogenicity within normal limits. No mass or hydronephrosis visualized.  Abdominal aorta:  Not visualized.  Other findings:  None.  IMPRESSION: Intrahepatic biliary ductal dilatation. The common bile duct is mildly dilated measuring 0.8 cm. The cause for dilatation is not identified.  Fatty infiltration of the liver.  Negative for gallstones.   Electronically Signed   By: Inge Rise M.D.   On: 06/03/2013 03:55   Ct Abdomen Pelvis W Contrast  06/03/2013   CLINICAL DATA:  Right upper quadrant abdominal pain. Sepsis. Possible ascending cholangitis.  EXAM: CT ABDOMEN AND PELVIS WITH CONTRAST  TECHNIQUE: Multidetector CT imaging of the abdomen and pelvis was performed using the standard protocol following bolus administration of intravenous contrast.  CONTRAST:  80  ML OMNIPAQUE IOHEXOL 300 MG/ML  SOLN  COMPARISON:  None.  FINDINGS: The lung bases demonstrate severe emphysematous change. No pleural or pericardial effusion. Heart size normal.  The liver is low attenuating consistent with fatty infiltration. There is mild, diffuse intrahepatic biliary ductal dilatation. The common bile duct is unremarkable. No air in the biliary tree is identified. No focal liver lesion or hepatic abscess is seen. The adrenal glands, spleen,  pancreas and kidneys appear normal. Aortoiliac atherosclerosis without aneurysm is noted. The stomach, small and large bowel and appendix appear normal. No lymphadenopathy or fluid is identified. There is no focal bony abnormality.  IMPRESSION: Mild intrahepatic biliary ductal dilatation is nonspecific but could be related to cholangitis. The common bile duct appears normal. Negative for hepatic abscess.  Fatty infiltration of the liver.  Severe emphysema.   Electronically Signed   By: Inge Rise M.D.   On: 06/03/2013 06:27   Dg Ercp Biliary & Pancreatic Ducts  06/03/2013  CLINICAL DATA:  ERCP  EXAM: ERCP with sphincterotomy  FLUOROSCOPY TIME:  3 min 39 seconds  TECHNIQUE: Multiple spot images obtained with the fluoroscopic device and submitted for interpretation post-procedure.  COMPARISON:  None.  FINDINGS: Distal end of an endoscope is visualized. There is a wire within the common bile duct and extending into the common hepatic duct. There is contrast injected into the common bile duct without a filling defect. There is mild intrahepatic biliary ductal dilatation. There is balloon dilatation of the distal common bile duct.  IMPRESSION: ERCP with sphincterotomy.  These images were submitted for radiologic interpretation only. Please see the procedural report for the amount of contrast and the fluoroscopy time utilized.   Electronically Signed   By: Kathreen Devoid   On: 06/03/2013 18:16    Microbiology: Recent Results (from the past 240 hour(s))  CULTURE, BLOOD (ROUTINE X 2)     Status: None   Collection Time    06/03/13  5:15 AM      Result Value Ref Range Status   Specimen Description BLOOD RIGHT ANTECUBITAL   Final   Special Requests BOTTLES DRAWN AEROBIC AND ANAEROBIC 5CC   Final   Culture  Setup Time     Final   Value: 06/03/2013 09:59     Performed at [facility]   Culture     Final   Value:        BLOOD CULTURE RECEIVED NO GROWTH TO DATE CULTURE WILL BE HELD FOR 5 DAYS BEFORE  ISSUING A FINAL NEGATIVE REPORT     Performed at [facility]   Report Status PENDING   Incomplete  CULTURE, BLOOD (ROUTINE X 2)     Status: None   Collection Time    06/03/13  5:22 AM      Result Value Ref Range Status   Specimen Description BLOOD RIGHT HAND   Final   Special Requests BOTTLES DRAWN AEROBIC ONLY [facility]   Final   Culture  Setup Time     Final   Value: 06/03/2013 09:59     Performed at [facility]   Culture     Final   Value:        BLOOD CULTURE RECEIVED NO GROWTH TO DATE CULTURE WILL BE HELD FOR 5 DAYS BEFORE ISSUING A FINAL NEGATIVE REPORT     Performed at [facility]   Report Status PENDING   Incomplete  SURGICAL PCR SCREEN     Status: None   Collection Time    06/05/13  8:32 AM      Result Value Ref Range Status   MRSA, PCR NEGATIVE  NEGATIVE Final   Staphylococcus aureus NEGATIVE  NEGATIVE Final   Comment:            The Xpert SA Assay (FDA     approved for NASAL specimens     in patients over 30 years of age),     is one component of     a comprehensive surveillance     program.  Test performance has     been validated by [employer] for patients greater     than or equal to 73 year old.     It is not intended     to diagnose infection nor to     guide or monitor treatment.     Labs: Basic Metabolic Panel:  Recent Labs Lab 06/02/13 2106 06/03/13 0650 06/04/13 1039 06/05/13 0628 06/06/13  0735  NA 135* 135* 135* 137 138  K 3.9 4.3 4.3 4.0 3.9  CL 94* 97 99 99 101  CO2 25 23 23 22 24   GLUCOSE 150* 137* 89 98 93  BUN 6 7 6  4* <3*  CREATININE 0.59 0.59 0.73 0.57 0.60  CALCIUM 9.5 8.6 8.4 9.2 9.2   Liver Function Tests:  Recent Labs Lab 06/02/13 2106 06/03/13 0650 06/04/13 1039 06/05/13 0628 06/06/13 0735  AST 554* 550* 105* 51* 32  ALT 349* 513* 268* 204* 138*  ALKPHOS 110 115 101 102 90  BILITOT 2.6* 3.9* 3.2* 1.8* 1.4*  PROT 7.9 6.6 6.3 6.9 6.8  ALBUMIN 4.0 3.2* 2.9* 3.1* 3.2*    Recent Labs Lab  06/02/13 2106  LIPASE 18   CBC:  Recent Labs Lab 06/02/13 2106 06/03/13 0650 06/04/13 1039 06/05/13 0628 06/08/13 0542  WBC 14.4* 13.5* 9.9 7.6 9.5  NEUTROABS 12.8* 12.1* 7.6  --   --   HGB 16.2 14.6 13.4 14.1 13.2  HCT 44.8 41.7 38.2* 39.7 37.5*  MCV 87.0 87.4 87.8 87.1 87.2  PLT 237 191 178 198 221   CBG:  Recent Labs Lab 06/07/13 0623 06/07/13 1200 06/07/13 1732 06/07/13 2119 06/08/13 0751  GLUCAP 94 112* 109* 107* 120*       SignedKaren Kitchens [phone]  [facility] 06/08/2013, 11:45 AM

## 2013-06-08 NOTE — Discharge Instructions (Signed)
LAPAROSCOPIC SURGERY: POST OP INSTRUCTIONS ° °1. DIET: Follow a light bland diet the first 24 hours after arrival home, such as soup, liquids, crackers, etc.  Be sure to include lots of fluids daily.  Avoid fast food or heavy meals as your are more likely to get nauseated.  Eat a low fat the next few days after surgery.   °2. Take your usually prescribed home medications unless otherwise directed. °3. PAIN CONTROL: °a. Pain is best controlled by a usual combination of three different methods TOGETHER: °i. Ice/Heat °ii. Over the counter pain medication °iii. Prescription pain medication °b. Most patients will experience some swelling and bruising around the incisions.  Ice packs or heating pads (30-60 minutes up to 6 times a day) will help. Use ice for the first few days to help decrease swelling and bruising, then switch to heat to help relax tight/sore spots and speed recovery.  Some people prefer to use ice alone, heat alone, alternating between ice & heat.  Experiment to what works for you.  Swelling and bruising can take several weeks to resolve.   °c. It is helpful to take an over-the-counter pain medication regularly for the first few weeks.  Choose one of the following that works best for you: °i. Naproxen (Aleve, etc)  Two 220mg tabs twice a day °ii. Ibuprofen (Advil, etc) Three 200mg tabs four times a day (every meal & bedtime) °iii. Acetaminophen (Tylenol, etc) 500-650mg four times a day (every meal & bedtime) °d. A  prescription for pain medication (such as oxycodone, hydrocodone, etc) should be given to you upon discharge.  Take your pain medication as prescribed.  °i. If you are having problems/concerns with the prescription medicine (does not control pain, nausea, vomiting, rash, itching, etc), please call us [phone] to see if we need to switch you to a different pain medicine that will work better for you and/or control your side effect better. °ii. If you need a refill on your pain medication,  please contact your pharmacy.  They will contact our office to request authorization. Prescriptions will not be filled after 5 pm or on week-ends. °4. Avoid getting constipated.  Between the surgery and the pain medications, it is common to experience some constipation.  Increasing fluid intake and taking a fiber supplement (such as Metamucil, Citrucel, FiberCon, MiraLax, etc) 1-2 times a day regularly will usually help prevent this problem from occurring.  A mild laxative (prune juice, Milk of Magnesia, MiraLax, etc) should be taken according to package directions if there are no bowel movements after 48 hours.   °5. Watch out for diarrhea.  If you have many loose bowel movements, simplify your diet to bland foods & liquids for a few days.  Stop any stool softeners and decrease your fiber supplement.  Switching to mild anti-diarrheal medications (Kayopectate, Pepto Bismol) can help.  If this worsens or does not improve, please call us. °6. Wash / shower every day.  You may shower over the dressings as they are waterproof.  Continue to shower over incision(s) after the dressing is off. °7. Remove your waterproof bandages 5 days after surgery.  You may leave the incision open to air.  You may replace a dressing/Band-Aid to cover the incision for comfort if you wish.  °8. ACTIVITIES as tolerated:   °a. You may resume regular (light) daily activities beginning the next day--such as daily self-care, walking, climbing stairs--gradually increasing activities as tolerated.  If you can walk 30 minutes without difficulty, it   is safe to try more intense activity such as jogging, treadmill, bicycling, low-impact aerobics, swimming, etc. b. Save the most intensive and strenuous activity for last such as sit-ups, heavy lifting, contact sports, etc  Refrain from any heavy lifting or straining until you are off narcotics for pain control.   c. DO NOT PUSH THROUGH PAIN.  Let pain be your guide: If it hurts to do something, don't  do it.  Pain is your body warning you to avoid that activity for another week until the pain goes down. d. You may drive when you are no longer taking prescription pain medication, you can comfortably wear a seatbelt, and you can safely maneuver your car and apply brakes. e. Dennis Bast may have sexual intercourse when it is comfortable.  9. FOLLOW UP in our office a. Please call [facility] at [phone] to set up an appointment to see your surgeon in the office for a follow-up appointment approximately 2-3 weeks after your surgery. b. Make sure that you call for this appointment the day you arrive home to insure a convenient appointment time. 10. IF YOU HAVE DISABILITY OR FAMILY LEAVE FORMS, BRING THEM TO THE OFFICE FOR PROCESSING.  DO NOT GIVE THEM TO YOUR DOCTOR.   WHEN TO CALL us [phone]: 1. Poor pain control 2. Reactions / problems with new medications (rash/itching, nausea, etc)  3. Fever over 101.5 F (38.5 C) 4. Inability to urinate 5. Nausea and/or vomiting 6. Worsening swelling or bruising 7. Continued bleeding from incision. 8. Increased pain, redness, or drainage from the incision   The clinic staff is available to answer your questions during regular business hours (8:30am-5pm).  Please dont hesitate to call and ask to speak to one of our nurses for clinical concerns.   If you have a medical emergency, go to the nearest emergency room or call 911.  A surgeon from [facility] is always on call at the [facility], [place], [place], [place], [place]  [address] ? MAIN: [phone] ? TOLL FREE: [phone] ?  FAX [phone] [email or web address]  STOP SMOKING

## 2013-06-08 NOTE — Progress Notes (Signed)
PULMONARY / CRITICAL CARE MEDICINE  Name: Blake Mcknight MRN: [id number] DOB: 1956-02-10    ADMISSION DATE:  06/02/2013 CONSULTATION DATE:  06/05/2013  REFERRING MD :  [facility] PRIMARY SERVICE:  [facility]  REASON FOR CONSULT:  Pre op evaluation  BRIEF PATIENT DESCRIPTION:  58 yo active smoker with emphysema presentedwith acute RUQ abd pain and found to have cholangitis and common bile duct stones.  Underwent ERCP.  PCCM asked to evaluate prior to laparoscopic cholecystectomy.   SIGNIFICANT EVENTS / STUDIES:  3/19  Presented with RUQ abdominal pain 3/20  ERCP, balloon extraction, sphincterotomy 3/23  Laparoscopic cholecystectomy   LINES / TUBES:  CULTURES: 3/20  Blood >>>  ANTIBIOTICS: Zosyn 3/20 >>>   SUBJECTIVE:  S/p lap chole.  No acute events overnight. Tolerating solid foods, has had BM.  No chest pain, SOB. VITAL SIGNS: Temp:  [97.7 F (36.5 C)-98.2 F (36.8 C)] 97.7 F (36.5 C) (03/25 0345) Pulse Rate:  [83-96] 83 (03/25 0345) Resp:  [18] 18 (03/25 0345) BP: (118-136)/(71-82) 136/82 mmHg (03/25 0345) SpO2:  [91 %-98 %] 96 % (03/25 0609) HEMODYNAMICS:   VENTILATOR SETTINGS:   INTAKE / OUTPUT: Intake/Output     03/24 0701 - 03/25 0700 03/25 0701 - 03/26 0700   P.O. 120    I.V. (mL/kg) 2481.7 (38.4)    IV Piggyback 50    Total Intake(mL/kg) 2651.7 (41.1)    Urine (mL/kg/hr) 1600 (1)    Blood     Total Output 1600     Net +1051.7           PHYSICAL EXAMINATION: General: resting in bed watching TV, in NAD. Neuro: A&O x 3, Non-focal.  HEENT: [place]/AT. PERRL, sclerae anicteric. Cardiovascular: tachy but regular, no M/R/G.  Lungs: Respirations even and unlabored, occasional wheeze. Abdomen: BS x 4, soft, NT/ND.  Musculoskeletal: No gross deformities, no edema.  Skin: Intact, warm, no rashes.  LABS: CBC  Recent Labs Lab 06/04/13 1039 06/05/13 0628 06/08/13 0542  WBC 9.9 7.6 9.5  HGB 13.4 14.1 13.2  HCT 38.2* 39.7 37.5*  PLT 178 198 221   Coag's  Recent  Labs Lab 06/03/13 0515  INR 0.99   BMET  Recent Labs Lab 06/04/13 1039 06/05/13 0628 06/06/13 0735  NA 135* 137 138  K 4.3 4.0 3.9  CL 99 99 101  CO2 23 22 24   BUN 6 4* <3*  CREATININE 0.73 0.57 0.60  GLUCOSE 89 98 93   Electrolytes  Recent Labs Lab 06/04/13 1039 06/05/13 0628 06/06/13 0735  CALCIUM 8.4 9.2 9.2   Sepsis Markers  Recent Labs Lab 06/03/13 0550 06/05/13 0628  LATICACIDVEN 2.54* 1.6   Liver Enzymes  Recent Labs Lab 06/04/13 1039 06/05/13 0628 06/06/13 0735  AST 105* 51* 32  ALT 268* 204* 138*  ALKPHOS 101 102 90  BILITOT 3.2* 1.8* 1.4*  ALBUMIN 2.9* 3.1* 3.2*   Glucose  Recent Labs Lab 06/06/13 1805 06/06/13 2339 06/07/13 0623 06/07/13 1200 06/07/13 1732 06/07/13 2119  GLUCAP 99 112* 94 112* 109* 107*   IMAGING: Dg Cholangiogram Operative  06/06/2013   CLINICAL DATA:  Cholangitis.  EXAM: INTRAOPERATIVE CHOLANGIOGRAM  TECHNIQUE: Cholangiographic images from the C-arm fluoroscopic device were submitted for interpretation post-operatively. Please see the procedural report for the amount of contrast and the fluoroscopy time utilized.  COMPARISON:  ERCP 06/03/2013  FINDINGS: An intraoperative cholangiogram was attempted. There is contrast extravasation in the right upper quadrant. Contrast did not fill the biliary system.  IMPRESSION: Attempted intraoperative cholangiogram.  No significant opacification of the biliary system.   Electronically Signed   By: Markus Daft M.D.   On: 06/06/2013 16:16   ASSESSMENT / PLAN:  COPD / emphysema, no evidence of exacerbation Possible alpha 1 antitrypsin deficiency Tobacco use disorder S/p laparoscopic cholecystectomy   Supplemental oxygen PRN  Ambulatory oxymetry prior to discharge  Pulmonary hygiene, IS, flutter valve  A1AT level and phenotype pending, will follow as outpatient  PFT's as an outpatient  Albuterol PRN  Dulera / Spiriva  Smoking cessation  [facility] appointment is set for  06/16/13 at 2:00PM  [place] H[place], [place] Pgr: [phone]  or [phone]  I have personally obtained history, examined patient, evaluated and interpreted laboratory and imaging results, reviewed medical records, formulated assessment / plan and placed orders.  Doree Fudge, MD Pulmonary and [place] Pager: [phone]  06/08/2013, 12:00 PM

## 2013-06-08 NOTE — Progress Notes (Signed)
Agree Elpidio Thielen, MD, MPH, FACS Trauma: [phone] General Surgery: [phone]  

## 2013-06-09 LAB — CULTURE, BLOOD (ROUTINE X 2)
Culture: NO GROWTH
Culture: NO GROWTH

## 2013-06-09 NOTE — Anesthesia Postprocedure Evaluation (Signed)
  Anesthesia Post-op Note  Patient: Blake Mcknight  Procedure(s) Performed: Procedure(s): ENDOSCOPIC RETROGRADE CHOLANGIOPANCREATOGRAPHY (ERCP) (N/A)  Patient Location: PACU  Anesthesia Type:MAC  Level of Consciousness: awake  Airway and Oxygen Therapy: Patient Spontanous Breathing  Post-op Pain: mild  Post-op Assessment: Post-op Vital signs reviewed  Post-op Vital Signs: stable  Complications: No apparent anesthesia complications

## 2013-06-09 NOTE — Care Management Note (Signed)
    Page 1 of 1   06/09/2013     11:04:51 AM   CARE MANAGEMENT NOTE 06/09/2013  Patient:  Blake Mcknight, Blake Mcknight   Account Number:  [id number]  Date Initiated:  06/09/2013  Documentation initiated by:  Tomi Bamberger  Subjective/Objective Assessment:   dx acute choliangitis  admit- lives with brother.     Action/Plan:   s/p lap chole - 3/23    advancing diet.   Anticipated DC Date:  06/08/2013   Anticipated DC Plan:  [place]  CM consult      Choice offered to / List presented to:             Status of service:  Completed, signed off Medicare Important Message given?   (If response is "NO", the following Medicare IM given date fields will be blank) Date Medicare IM given:   Date Additional Medicare IM given:    Discharge Disposition:  HOME/SELF CARE  Per UR Regulation:  Reviewed for med. necessity/level of care/duration of stay  If discussed at [place] of Stay Meetings, dates discussed:    Comments:

## 2013-06-16 ENCOUNTER — Encounter: Payer: Self-pay | Admitting: Adult Health

## 2013-06-16 ENCOUNTER — Ambulatory Visit (INDEPENDENT_AMBULATORY_CARE_PROVIDER_SITE_OTHER): Payer: Medicare Other | Admitting: Adult Health

## 2013-06-16 VITALS — BP 132/68 | HR 100 | Temp 97.7°F | Ht 67.5 in | Wt 151.2 lb

## 2013-06-16 DIAGNOSIS — J449 Chronic obstructive pulmonary disease, unspecified: Secondary | ICD-10-CM

## 2013-06-16 NOTE — Progress Notes (Signed)
   Subjective:    Patient ID: Blake Mcknight, male    DOB: June 16, 1955, 58 y.o.   MRN: [id number]  HPI 58 year old male active smoker with a known history of bullous emphysema and schizophrenia.  06/16/2013 [facility] follow up  Patient returns for a post hospital followup. Patient was admitted March 19 through March 25, for abdominal pain, with nausea, vomiting. He was found to have acute descending cholangitis. ERCP was done with extraction of multiple common bile duct stones. He underwent and a cholecystectomy on March 23. Patient was felt to have some early sepsis and was treated with aggressive IV antibiotics, and was discharged on Augmentin. This resolved with IV fluids and antibiotics. Patient was consult did by pulmonary for his underlying severe COPD. He was treated with aggressive pulmonary hygiene. Discharged on Spiriva and Symbicort. He was also recommended to have an alpha-1 test. Chest x-ray showed bullous emphysema. Alpha-1 level was at 216 the genotype is pending.  Since discharge. Patient is feeling better, still has some cough with white mucus.  He denies any hemoptysis, orthopnea, PND, or leg swelling. Of note, patient is on an ACE inhibitor. He denies  Has not smoked for 1 week.  Lives with his brother, sister is here today .     Review of Systems Constitutional:   No  weight loss, night sweats,  Fevers, chills, fatigue, or  lassitude.  HEENT:   No headaches,  Difficulty swallowing,  Tooth/dental problems, or  Sore throat,                No sneezing, itching, ear ache,  +nasal congestion, post nasal drip,   CV:  No chest pain,  Orthopnea, PND, swelling in lower extremities, anasarca, dizziness, palpitations, syncope.   GI  No heartburn, indigestion, abdominal pain, nausea, vomiting, diarrhea, change in bowel habits, loss of appetite, bloody stools.   Resp:   No chest wall deformity  Skin: no rash or lesions.  GU: no dysuria, change in color of urine, no  urgency or frequency.  No flank pain, no hematuria   MS:  No joint pain or swelling.  No decreased range of motion.  No back pain.  Psych:  No change in mood or affect. No depression or anxiety.  No memory loss.         Objective:   Physical Exam GEN: A/Ox3; pleasant , NAD, appears older than stated age  41:  [place]/AT,  EACs-clear, TMs-wnl, NOSE-clear, THROAT-clear, no lesions, no postnasal drip or exudate noted.   NECK:  Supple w/ fair ROM; no JVD; normal carotid impulses w/o bruits; no thyromegaly or nodules palpated; no lymphadenopathy.  RESP  Diminished BS in bases w/o, wheezes/ rales/ or rhonchi.no accessory muscle use, no dullness to percussion  CARD:  RRR, no m/r/g  , no peripheral edema, pulses intact, no cyanosis or clubbing.  GI:   Soft & nt; nml bowel sounds; no organomegaly or masses detected.  Musco: Warm bil, no deformities or joint swelling noted.   Neuro: alert, no focal deficits noted.    Skin: Warm, no lesions or rashes         Assessment & Plan:

## 2013-06-16 NOTE — Patient Instructions (Signed)
Continue on Spiriva and Symbicort .  Great job on not smoking .  Follow up with Dr. Lenna Gilford  In 3-4 weeks with PFT  Please contact office for sooner follow up if symptoms do not improve or worsen or seek emergency care

## 2013-06-16 NOTE — Assessment & Plan Note (Signed)
Recent mild flare with hospitalization for acute ascending cholangitis .  Alpha 1 level is ok , follow genotype.  Return for PFT   Plan  Continue on Spiriva and Symbicort .  Great job on not smoking .  Follow up with Dr. Lenna Gilford  In 3-4 weeks with PFT  Please contact office for sooner follow up if symptoms do not improve or worsen or seek emergency care

## 2013-06-18 LAB — ALPHA-1 ANTITRYPSIN PHENOTYPE: A1 ANTITRYPSIN: 196 mg/dL (ref 83–199)

## 2013-06-28 ENCOUNTER — Encounter (INDEPENDENT_AMBULATORY_CARE_PROVIDER_SITE_OTHER): Payer: Self-pay | Admitting: General Surgery

## 2013-06-28 ENCOUNTER — Ambulatory Visit (INDEPENDENT_AMBULATORY_CARE_PROVIDER_SITE_OTHER): Payer: Medicare Other | Admitting: General Surgery

## 2013-06-28 VITALS — BP 128/76 | HR 80 | Temp 98.1°F | Resp 16 | Ht 66.0 in | Wt 147.2 lb

## 2013-06-28 DIAGNOSIS — F209 Schizophrenia, unspecified: Secondary | ICD-10-CM | POA: Insufficient documentation

## 2013-06-28 DIAGNOSIS — K8309 Other cholangitis: Secondary | ICD-10-CM

## 2013-06-28 HISTORY — DX: Schizophrenia, unspecified: F20.9

## 2013-06-28 NOTE — Progress Notes (Signed)
Blake Mcknight Feb 14, 1956 [id number] 06/28/2013   Blake Mcknight is a 58 y.o. male who had a laparoscopic cholecystectomy with intraoperative cholangiogram by Dr. Georganna Skeans.  The pathology report confirmed Chronic cholecystitis and cholelithaisis.  The patient reports that they are feeling well with normal bowel movements and good appetite.  The pre-operative symptoms of abdominal pain, nausea, and vomiting have resolved.    Physical examination - Incisions appear well-healed with no sign of infection or bleeding.   Abdomen - soft, non-tender  Impression:  s/p laparoscopic cholecystectomy  Plan:  He may resume a regular diet and full activity.  He may follow-up on a PRN basis.

## 2013-06-28 NOTE — Patient Instructions (Signed)
Call us again if you have any issues.

## 2013-07-04 DIAGNOSIS — K269 Duodenal ulcer, unspecified as acute or chronic, without hemorrhage or perforation: Secondary | ICD-10-CM | POA: Diagnosis not present

## 2013-07-04 DIAGNOSIS — L851 Acquired keratosis [keratoderma] palmaris et plantaris: Secondary | ICD-10-CM | POA: Diagnosis not present

## 2013-07-04 DIAGNOSIS — J449 Chronic obstructive pulmonary disease, unspecified: Secondary | ICD-10-CM | POA: Diagnosis not present

## 2013-07-04 DIAGNOSIS — J95812 Postprocedural air leak: Secondary | ICD-10-CM | POA: Diagnosis not present

## 2013-07-26 ENCOUNTER — Ambulatory Visit (INDEPENDENT_AMBULATORY_CARE_PROVIDER_SITE_OTHER): Payer: Medicare Other | Admitting: Pulmonary Disease

## 2013-07-26 ENCOUNTER — Encounter: Payer: Self-pay | Admitting: Pulmonary Disease

## 2013-07-26 VITALS — BP 100/62 | HR 130 | Temp 97.3°F | Ht 67.0 in | Wt 150.0 lb

## 2013-07-26 DIAGNOSIS — J439 Emphysema, unspecified: Secondary | ICD-10-CM

## 2013-07-26 DIAGNOSIS — F17200 Nicotine dependence, unspecified, uncomplicated: Secondary | ICD-10-CM | POA: Diagnosis not present

## 2013-07-26 DIAGNOSIS — J449 Chronic obstructive pulmonary disease, unspecified: Secondary | ICD-10-CM | POA: Diagnosis not present

## 2013-07-26 LAB — PULMONARY FUNCTION TEST
FEF 25-75 POST: 0.39 L/s
FEF 25-75 Pre: 0.38 L/sec
FEF2575-%Change-Post: 0 %
FEF2575-%Pred-Post: 13 %
FEF2575-%Pred-Pre: 13 %
FEV1-%Change-Post: 1 %
FEV1-%Pred-Post: 36 %
FEV1-%Pred-Pre: 35 %
FEV1-PRE: 1.03 L
FEV1-Post: 1.05 L
FEV1FVC-%Change-Post: -1 %
FEV1FVC-%Pred-Pre: 66 %
FEV6-%CHANGE-POST: 2 %
FEV6-%PRED-PRE: 53 %
FEV6-%Pred-Post: 54 %
FEV6-POST: 1.92 L
FEV6-Pre: 1.88 L
FEV6FVC-%CHANGE-POST: -1 %
FEV6FVC-%PRED-POST: 98 %
FEV6FVC-%Pred-Pre: 99 %
FVC-%CHANGE-POST: 3 %
FVC-%PRED-POST: 55 %
FVC-%PRED-PRE: 53 %
FVC-POST: 2.02 L
FVC-PRE: 1.95 L
PRE FEV6/FVC RATIO: 96 %
Post FEV1/FVC ratio: 52 %
Post FEV6/FVC ratio: 95 %
Pre FEV1/FVC ratio: 53 %

## 2013-07-26 NOTE — Patient Instructions (Signed)
Today we updated your med list in our EPIC system...    Continue your current medications the same...  Continue the Symbicort160- 2 sprays twice daily, and the Spiriva- inhale the contents of one capsule via handihaler once daily...    DrKilpatrick can provide you with refill prescriptions when needed...  Add the OTC MUCINEX taking 1200mg  twice daily (eg- 600mg  tabs- take 2 tabs twice daily) w/ lots of fluids...  Call for any questions or if I can be of assistance in any way.Marland KitchenMarland Kitchen

## 2013-07-26 NOTE — Progress Notes (Signed)
Subjective:     Patient ID: Blake Mcknight, male   DOB: 01/20/1956, 58 y.o.   MRN: [id number]  HPI 58 y/o BM, regular pt of Dr. [name], w/ hx Bullous emphysema, prev 1/2ppd smoker, HBP, HL, peptic ulcer dis, and schizophrenia; Presented to the ER 3/15 w/ acute RUQ abd pain and found to have cholangitis and common bile duct stones; s/p ERCP and subseq LapChole; we saw him for perioperative management of his COPD/ bullous emphysema>  COPD/ bullous emphysema>> no old records avail, hx from pt & avail family members; long smoking hx starting in his teens=> present for a 80 yr hx smoking, max 1ppd, but decr to 1/2ppd over the last few yrs; they state NEG FamHx of any lung problems in parents or siblings; he worked for [employer] in the [employer] but I could not quite understand his explanation of exposures; his LMD is [place] who sees him every 60mo or so, no records avail of prev evaluations, PFTs, A1AT level, etc; old CXR report in [place] from 2003 showed diffuse severe bullous emphysema at that time and CT Chest 2003 confirmed severe bullous dis & blebs bilat (no adenopathy, masses, or effusions); pt notes history of DOE/ SOB/ wheezing/ chest congestion- SOB w/ ADLs x yrs (eg- waking around the house) and he is quite sedentary at baseline...  ~  07/26/13> he reports that he quit smoking after disch from the hosp; he notes persistent chest congestion, cough, thick beige sput but no hemoptysis; he has chr stable DOE w/ ADLs & has remained too sedentary since his disch; he denies CP, palpit, edema; he has recovered from the GI standpoint; he has been using the Symbicort160- 2spBid & the Spiriva daily; we reviewed his technique & it is fair (may benefit from NEB in the furure); we reviewed his PFT results and the lab work; he is asked to increase his physical activity and consider PulmRehab... I walked him in the office on room air today>> nadir of O2 sats was 91% after 2 laps w/ heart rate of  150/min...   OLD FILMS>> reports in Epic from 2003 showed diffuse severe bullous emphysema at that time, and CT Chest 2003 confirmed severe bullous dis & blebs bilat (no adenopathy, masses, or effusions)...  PRIOR PFTs>> none avail   RECENT CXR>> hyperinflated, diffuse bullous emphysema w/ thickened interstitium, NAD, normal heart size...   PFTs>> done 07/26/13>> he could not perform the Lung Volume procedure or the DLCO testing; Spiromety revealed FVC=1.95 (53%), FEV1=1.03 (35%), %1sec=53, mid-flows= 13%predicted; after bronchdilator FEV1=1.05 (+1%)...   LABS>> [place]- ok x LFTs which slowly improved after his GB surg;  CBC- wnl;  A1AT level=216 and MM phenotype...    EKG 3/15 showed STachy, rate102, rightward axis, NSSTTWA...   2DEchocardiogram>> has not been done yet   Past Medical History  Diagnosis Date  . COPD (chronic obstructive pulmonary disease)     with bullous emphysema.   Marland Kitchen Heavy cigarette smoker before 2003    pt claims only 10 cigs per day, never heavier amounts.   . Hypertension   . HLD (hyperlipidemia)   . Schizophrenia 06/28/2013    This is a chronic condition and he lives with family    Past Surgical History  Procedure Laterality Date  . No past surgeries    . Ercp N/A 06/03/2013    Procedure: ENDOSCOPIC RETROGRADE CHOLANGIOPANCREATOGRAPHY (ERCP);  Surgeon: Ladene Artist, MD;  [facility] ENDOSCOPY;  Service: Endoscopy;  Laterality: N/A;  . Cholecystectomy N/A  06/06/2013    Procedure: LAPAROSCOPIC CHOLECYSTECTOMY WITH ATTEMPTED INTRAOPERATIVE CHOLANGIOGRAM;  Surgeon: Zenovia Jarred, MD;  Location: [facility] OR;  Service: General;  Laterality: N/A;    Family History  Problem Relation Age of Onset  . Diabetes Mellitus II Mother   . Diabetes Mother   . CAD Neg Hx   . Stroke Maternal Aunt   . Heart disease Father    History   Social History  . Marital Status: Single    Spouse Name: N/A    Number of Children: N/A  . Years of Education: N/A    Occupational History  . Not on file.   Social History Main Topics  . Smoking status: Current Some Day Smoker -- 1.00 packs/day for 10 years    Types: Cigarettes  . Smokeless tobacco: Former Systems developer    Types: Snuff  . Alcohol Use: No  . Drug Use: No  . Sexual Activity: Yes   Other Topics Concern  . Not on file   Social History Narrative  . No narrative on file    Outpatient Encounter Prescriptions as of 07/26/2013  Medication Sig  . benztropine (COGENTIN) 2 MG tablet Take 1 tablet by mouth 3 (three) times daily.  . budesonide-formoterol (SYMBICORT) 160-4.5 MCG/ACT inhaler Inhale 2 puffs into the lungs 2 (two) times daily.  . cholecalciferol (VITAMIN D) 1000 UNITS tablet Take 1,000 Units by mouth daily.  . Cyanocobalamin (B-12 PO) Take 1 tablet by mouth daily.  . enalapril (VASOTEC) 10 MG tablet Take 1 tablet by mouth daily.  . famotidine (PEPCID) 40 MG tablet Take 1 tablet by mouth 2 (two) times daily.  Marland Kitchen HYDROcodone-acetaminophen (NORCO) 7.5-325 MG per tablet Take 1 tablet by mouth every 8 (eight) hours as needed.  . mirtazapine (REMERON) 30 MG tablet Take 1 tablet by mouth daily.  . Multiple Vitamins-Minerals (MULTIVITAMIN WITH MINERALS) tablet Take 1 tablet by mouth daily.  Marland Kitchen PROAIR HFA 108 (90 BASE) MCG/ACT inhaler Inhale 2 puffs into the lungs 2 (two) times daily.   . risperidone (RISPERDAL) 4 MG tablet Take 1 tablet by mouth 2 (two) times daily.  Marland Kitchen tiotropium (SPIRIVA) 18 MCG inhalation capsule Place 1 capsule (18 mcg total) into inhaler and inhale daily.  . vitamin C (ASCORBIC ACID) 500 MG tablet Take 500 mg by mouth daily.  . [DISCONTINUED] nicotine ([employer] NICOTINE TRANSDERMAL SYS) 21 mg/24hr patch Place 1 patch (21 mg total) onto the skin daily.    No Known Allergies   Current Medications, Allergies, Past Medical History, Past Surgical History, Family History, and Social History were reviewed in [employer] record.   Review of Systems     Constitutional: Denies F/C/S, anorexia, unexpected weight change.  HEENT: No HA, visual changes, earache, nasal symptoms, sore throat, hoarseness.  Resp: +cough, clear sputum, no hemoptysis; +SOB, +chest tightness, +wheezing.  Cardio: No CP, no palpit, +DOE, denies orthopnea, no edema.  GI: Denies N/V/D/C or blood in stool; mild reflux, adm w/ abd pain/ distention.  GU: No dysuria, freq, urgency, hematuria, or flank pain.  MS: Denies joint pain, swelling, tenderness, or decr ROM; no neck pain, back pain, etc.  Neuro: No tremors, seizures, dizziness, syncope, weakness, numbness, gait abn.  Skin: No suspicious lesions or skin rash.  Heme: No adenopathy, bruising, bleeding.  Psyche: 29 yr hx schizophrenia on meds prescribed by DrKilpatrick   Objective:   Physical Exam    Vital Signs: Reviewed...  General: WD, WN, 58 y/o BM in NAD; alert & cooperative; flat  affect...  HEENT: [place]/AT; Conjunctiva- pink, Sclera- nonicteric, EOM-wnl, PERRLA, Fundi-benign; EACs-clear, TMs-wnl; NOSE-clear; THROAT-clear & wnl.  Neck: Supple w/ full ROM; no JVD; normal carotid impulses w/o bruits; no thyromegaly or nodules palpated; no lymphadenopathy.  Chest: Decreased BS bilat; with scat wheezing & rhonchi at bases bilat... Heart: Regular Rhythm; norm S1 & S2 without murmurs, rubs, or gallops detected.  Abdomen: Soft & non- tender; no guarding or rebound at present; normal bowel sounds; no organomegaly or masses palpated.  Ext: Normal ROM; without deformities or arthritic changes; no varicose veins, +venous insuffic, no edema; Pulses intact w/o bruits.  Neuro: CNs II-XII intact; motor testing normal; sensory testing normal; gait normal & balance OK.  Derm: No lesions noted; no rash etc.  Lymph: No cervical, supraclavicular, axillary, or inguinal adenopathy palpated.   Assessment:      1)  BULLOUS EMPHYSEMA> normal Alpha-1-Antitrypsin level and MM phenotype 2)  GOLD Stage3 COPD> FEV1= 1.03L (35%predicted) w/ no  sign reversibility 3)  Recent ex-smoker 4)  Other medical problems include:  S/P Lap Chole for CBD stones and cholangitis, HxHBP, Hyperlipidemia, PUD, Schizophrenia...   He is congratulated on his smoking cessation & encouraged to stay quit; asked to continue the SYMBICORT160- 2spBid and the [place]- one cap via handihaler daily; asked to start [facility] 1200mg  Bid w/ plenty of fluids & he has ProairHFA for prn use; he may need the addition of a nebulizer if he is unable to clear the mucus; also asked to start a regular exercise program- he is inclined to do this at home in his neighborhood but we broached the subject of [place] important this is... He is followed regularly by Dr. [name] & can f/u here prn...     Plan:     Patient's Medications  New Prescriptions   No medications on file  Previous Medications   BENZTROPINE (COGENTIN) 2 MG TABLET    Take 1 tablet by mouth 3 (three) times daily.   BUDESONIDE-FORMOTEROL (SYMBICORT) 160-4.5 MCG/ACT INHALER    Inhale 2 puffs into the lungs 2 (two) times daily.   CHOLECALCIFEROL (VITAMIN D) 1000 UNITS TABLET    Take 1,000 Units by mouth daily.   CYANOCOBALAMIN (B-12 PO)    Take 1 tablet by mouth daily.   ENALAPRIL (VASOTEC) 10 MG TABLET    Take 1 tablet by mouth daily.   FAMOTIDINE (PEPCID) 40 MG TABLET    Take 1 tablet by mouth 2 (two) times daily.   HYDROCODONE-ACETAMINOPHEN (NORCO) 7.5-325 MG PER TABLET    Take 1 tablet by mouth every 8 (eight) hours as needed.   MIRTAZAPINE (REMERON) 30 MG TABLET    Take 1 tablet by mouth daily.   MULTIPLE VITAMINS-MINERALS (MULTIVITAMIN WITH MINERALS) TABLET    Take 1 tablet by mouth daily.   PROAIR HFA 108 (90 BASE) MCG/ACT INHALER    Inhale 2 puffs into the lungs 2 (two) times daily.    RISPERIDONE (RISPERDAL) 4 MG TABLET    Take 1 tablet by mouth 2 (two) times daily.   TIOTROPIUM (SPIRIVA) 18 MCG INHALATION CAPSULE    Place 1 capsule (18 mcg total) into inhaler and inhale daily.   VITAMIN  C (ASCORBIC ACID) 500 MG TABLET    Take 500 mg by mouth daily.  Modified Medications   No medications on file  Discontinued Medications   NICOTINE ([employer]S NICOTINE TRANSDERMAL SYS) 21 MG/24HR PATCH    Place 1 patch (21 mg total) onto the skin daily.

## 2013-07-26 NOTE — Progress Notes (Signed)
Spirometry before and after done today. 

## 2013-10-10 DIAGNOSIS — K269 Duodenal ulcer, unspecified as acute or chronic, without hemorrhage or perforation: Secondary | ICD-10-CM | POA: Diagnosis not present

## 2013-10-10 DIAGNOSIS — J449 Chronic obstructive pulmonary disease, unspecified: Secondary | ICD-10-CM | POA: Diagnosis not present

## 2013-10-10 DIAGNOSIS — E7800 Pure hypercholesterolemia, unspecified: Secondary | ICD-10-CM | POA: Diagnosis not present

## 2013-10-10 DIAGNOSIS — Z79899 Other long term (current) drug therapy: Secondary | ICD-10-CM | POA: Diagnosis not present

## 2013-10-10 DIAGNOSIS — I119 Hypertensive heart disease without heart failure: Secondary | ICD-10-CM | POA: Diagnosis not present

## 2013-10-27 ENCOUNTER — Emergency Department (HOSPITAL_COMMUNITY): Payer: Medicare Other

## 2013-10-27 ENCOUNTER — Emergency Department (HOSPITAL_COMMUNITY)
Admission: EM | Admit: 2013-10-27 | Discharge: 2013-10-27 | Disposition: A | Discharge status: Home / Self Care | Payer: Medicare Other | Attending: Emergency Medicine | Admitting: Emergency Medicine

## 2013-10-27 ENCOUNTER — Encounter (HOSPITAL_COMMUNITY): Payer: Self-pay | Admitting: Emergency Medicine

## 2013-10-27 DIAGNOSIS — F17200 Nicotine dependence, unspecified, uncomplicated: Secondary | ICD-10-CM | POA: Diagnosis not present

## 2013-10-27 DIAGNOSIS — F203 Undifferentiated schizophrenia: Secondary | ICD-10-CM

## 2013-10-27 DIAGNOSIS — J441 Chronic obstructive pulmonary disease with (acute) exacerbation: Secondary | ICD-10-CM | POA: Diagnosis not present

## 2013-10-27 DIAGNOSIS — IMO0002 Reserved for concepts with insufficient information to code with codable children: Secondary | ICD-10-CM | POA: Insufficient documentation

## 2013-10-27 DIAGNOSIS — Z862 Personal history of diseases of the blood and blood-forming organs and certain disorders involving the immune mechanism: Secondary | ICD-10-CM | POA: Insufficient documentation

## 2013-10-27 DIAGNOSIS — I10 Essential (primary) hypertension: Secondary | ICD-10-CM | POA: Insufficient documentation

## 2013-10-27 DIAGNOSIS — Z8639 Personal history of other endocrine, nutritional and metabolic disease: Secondary | ICD-10-CM | POA: Insufficient documentation

## 2013-10-27 DIAGNOSIS — R4182 Altered mental status, unspecified: Secondary | ICD-10-CM | POA: Diagnosis not present

## 2013-10-27 DIAGNOSIS — F209 Schizophrenia, unspecified: Secondary | ICD-10-CM | POA: Diagnosis not present

## 2013-10-27 DIAGNOSIS — F4489 Other dissociative and conversion disorders: Secondary | ICD-10-CM | POA: Diagnosis not present

## 2013-10-27 DIAGNOSIS — J439 Emphysema, unspecified: Secondary | ICD-10-CM | POA: Insufficient documentation

## 2013-10-27 DIAGNOSIS — Z79899 Other long term (current) drug therapy: Secondary | ICD-10-CM | POA: Diagnosis not present

## 2013-10-27 DIAGNOSIS — R0602 Shortness of breath: Secondary | ICD-10-CM | POA: Diagnosis not present

## 2013-10-27 LAB — URINALYSIS, ROUTINE W REFLEX MICROSCOPIC
BILIRUBIN URINE: NEGATIVE
GLUCOSE, UA: NEGATIVE mg/dL
Hgb urine dipstick: NEGATIVE
Ketones, ur: >80 mg/dL — AB
Leukocytes, UA: NEGATIVE
Nitrite: NEGATIVE
PROTEIN: NEGATIVE mg/dL
SPECIFIC GRAVITY, URINE: 1.018 (ref 1.005–1.030)
UROBILINOGEN UA: 1 mg/dL (ref 0.0–1.0)
pH: 6.5 (ref 5.0–8.0)

## 2013-10-27 LAB — COMPREHENSIVE METABOLIC PANEL
ALBUMIN: 3.8 g/dL (ref 3.5–5.2)
ALT: 16 U/L (ref 0–53)
ANION GAP: 17 — AB (ref 5–15)
AST: 16 U/L (ref 0–37)
Alkaline Phosphatase: 72 U/L (ref 39–117)
BUN: 7 mg/dL (ref 6–23)
CO2: 22 mEq/L (ref 19–32)
Calcium: 9.4 mg/dL (ref 8.4–10.5)
Chloride: 96 mEq/L (ref 96–112)
Creatinine, Ser: 0.6 mg/dL (ref 0.50–1.35)
GFR calc Af Amer: >90 mL/min (ref >90)
GFR calc non Af Amer: >90 mL/min (ref >90)
Glucose, Bld: 106 mg/dL — ABNORMAL HIGH (ref 70–99)
Potassium: 4.3 mEq/L (ref 3.7–5.3)
Sodium: 135 mEq/L — ABNORMAL LOW (ref 137–147)
Total Bilirubin: 0.4 mg/dL (ref 0.3–1.2)
Total Protein: 7.9 g/dL (ref 6.0–8.3)

## 2013-10-27 LAB — CBC WITH DIFFERENTIAL/PLATELET
BASOS ABS: 0 10*3/uL (ref 0.0–0.1)
Basophils Relative: 0 % (ref 0–1)
Eosinophils Absolute: 0 10*3/uL (ref 0.0–0.7)
Eosinophils Relative: 0 % (ref 0–5)
HCT: 43.2 % (ref 39.0–52.0)
Hemoglobin: 15 g/dL (ref 13.0–17.0)
LYMPHS ABS: 0.9 10*3/uL (ref 0.7–4.0)
LYMPHS PCT: 10 % — AB (ref 12–46)
MCH: 29.9 pg (ref 26.0–34.0)
MCHC: 34.7 g/dL (ref 30.0–36.0)
MCV: 86.1 fL (ref 78.0–100.0)
MONO ABS: 0.3 10*3/uL (ref 0.1–1.0)
MONOS PCT: 4 % (ref 3–12)
NEUTROS ABS: 7.9 10*3/uL — AB (ref 1.7–7.7)
Neutrophils Relative %: 86 % — ABNORMAL HIGH (ref 43–77)
Platelets: 288 10*3/uL (ref 150–400)
RBC: 5.02 MIL/uL (ref 4.22–5.81)
RDW: 13.8 % (ref 11.5–15.5)
WBC: 9.2 10*3/uL (ref 4.0–10.5)

## 2013-10-27 LAB — RAPID URINE DRUG SCREEN, HOSP PERFORMED
AMPHETAMINES: NOT DETECTED
BARBITURATES: NOT DETECTED
Benzodiazepines: NOT DETECTED
COCAINE: NOT DETECTED
OPIATES: NOT DETECTED
TETRAHYDROCANNABINOL: NOT DETECTED

## 2013-10-27 LAB — ETHANOL: Alcohol, Ethyl (B): <11 mg/dL (ref 0–11)

## 2013-10-27 MED ORDER — ALBUTEROL SULFATE (2.5 MG/3ML) 0.083% IN NEBU
5.0000 mg | INHALATION_SOLUTION | Freq: Once | RESPIRATORY_TRACT | Status: DC
  Filled 2013-10-27: qty 6

## 2013-10-27 MED ORDER — SODIUM CHLORIDE 0.9 % IV BOLUS (SEPSIS)
500.0000 mL | Freq: Once | INTRAVENOUS | Status: AC
  Administered 2013-10-27: 500 mL via INTRAVENOUS

## 2013-10-27 NOTE — Discharge Instructions (Signed)
You were seen in the ED today for confusion and difficulty breathing. You received steroids and breathing treatments which improved your breathing. Please schedule follow-up with your PCP within 1 week. Seek medical attention or return to the ED if you have any new or worsening shortness of breath, confusion, or other worrisome medical condition.

## 2013-10-27 NOTE — ED Notes (Signed)
MD and sister at bedside.

## 2013-10-27 NOTE — ED Provider Notes (Signed)
CSN: [id number]     Arrival date & time 10/27/13  1146 History   First MD Initiated Contact with Patient 10/27/13 1205     Chief Complaint  Patient presents with  . Altered Mental Status  . Shortness of Breath     (Consider location/radiation/quality/duration/timing/severity/associated sxs/prior Treatment) Patient is a 58 y.o. male presenting with altered mental status and shortness of breath.  Altered Mental Status Associated symptoms: no abdominal pain, no fever, no headaches, no nausea, no palpitations, no vomiting and no weakness   Shortness of Breath Associated symptoms: no abdominal pain, no chest pain, no cough, no diaphoresis, no fever, no headaches and no vomiting     Blake Mcknight is a 58 year old man with bullous emphysema, schizophrenia, and HTN brought by EMS this morning for altered mental status and SOB. Patient poor historian and says he lives with his brother who was not present at interview. Per record, a neighbor saw him acting strangely? in front yard and called EMS. When they found him he was short of breath and altered. They gave him albuterol 10 mg, 1 of atrovent, and solumedrol 125. Ambulance EKG showed sinus tachycardia in 120s. On interview, he had no complaints and said he felt fine. He was not sure why he was here. His shortness of breath apparently had resolved.  Past Medical History  Diagnosis Date  . COPD (chronic obstructive pulmonary disease)     with bullous emphysema.   Marland Kitchen Heavy cigarette smoker before 2003    pt claims only 10 cigs per day, never heavier amounts.   . Hypertension   . HLD (hyperlipidemia)   . Schizophrenia 06/28/2013    This is a chronic condition and he lives with family   Past Surgical History  Procedure Laterality Date  . No past surgeries    . Ercp N/A 06/03/2013    Procedure: ENDOSCOPIC RETROGRADE CHOLANGIOPANCREATOGRAPHY (ERCP);  Surgeon: Ladene Artist, MD;  Location: [facility] ENDOSCOPY;  Service: Endoscopy;  Laterality: N/A;  .  Cholecystectomy N/A 06/06/2013    Procedure: LAPAROSCOPIC CHOLECYSTECTOMY WITH ATTEMPTED INTRAOPERATIVE CHOLANGIOGRAM;  Surgeon: Zenovia Jarred, MD;  Location: [facility];  Service: General;  Laterality: N/A;   Family History  Problem Relation Age of Onset  . Diabetes Mellitus II Mother   . Diabetes Mother   . CAD Neg Hx   . Stroke Maternal Aunt   . Heart disease Father    History  Substance Use Topics  . Smoking status: Current Some Day Smoker -- 1.00 packs/day for 10 years    Types: Cigarettes  . Smokeless tobacco: Former Systems developer    Types: Snuff  . Alcohol Use: No    Review of Systems  Constitutional: Negative for fever, chills, diaphoresis and fatigue.  Respiratory: Positive for shortness of breath. Negative for cough and chest tightness.   Cardiovascular: Negative for chest pain and palpitations.  Gastrointestinal: Negative for nausea, vomiting, abdominal pain, diarrhea and constipation.  Endocrine: Negative for polyuria.  Genitourinary: Negative for dysuria.  Neurological: Negative for dizziness, syncope, weakness, numbness and headaches.      Allergies  Review of patient's allergies indicates no known allergies.  Home Medications   Prior to Admission medications   Medication Sig Start Date End Date Taking? Authorizing Provider  benztropine (COGENTIN) 2 MG tablet Take 2 mg by mouth 2 (two) times daily.  06/01/13  Yes Historical Provider, MD  budesonide-formoterol (SYMBICORT) 160-4.5 MCG/ACT inhaler Inhale 1 puff into the lungs daily.   Yes Historical Provider, MD  cholecalciferol (VITAMIN D) 1000 UNITS tablet Take 1,000 Units by mouth daily.   Yes Historical Provider, MD  Cyanocobalamin (B-12 PO) Take 1 tablet by mouth daily.   Yes Historical Provider, MD  enalapril (VASOTEC) 10 MG tablet Take 10 mg by mouth daily.  05/31/13  Yes Historical Provider, MD  famotidine (PEPCID) 40 MG tablet Take 40 mg by mouth 2 (two) times daily.  04/11/13  Yes Historical Provider, MD  PROAIR HFA  108 (90 BASE) MCG/ACT inhaler Inhale 1 puff into the lungs every 6 (six) hours as needed for wheezing or shortness of breath.  05/12/13  Yes Historical Provider, MD  tiotropium (SPIRIVA) 18 MCG inhalation capsule Place 1 capsule (18 mcg total) into inhaler and inhale daily. 06/08/13  Yes Marianne L York, PA-C   BP 130/93  Pulse 103  Temp(Src) 99 F (37.2 C) (Rectal)  Resp 18  SpO2 95% Physical Exam  Constitutional: He appears well-developed and well-nourished. No distress.  HENT:  Head: Normocephalic and atraumatic.  OP clear but dry MM  Eyes: EOM are normal. Pupils are equal, round, and reactive to light. No scleral icterus.  Cardiovascular: Regular rhythm, normal heart sounds and intact distal pulses.  Exam reveals no gallop and no friction rub.   No murmur heard. Tachycardic in 110s  Pulmonary/Chest: No respiratory distress. He has wheezes.  Diffuse bilateral expiratory wheezes, shallow respirations  Abdominal: Soft. Bowel sounds are normal. He exhibits no distension. There is no tenderness.  Musculoskeletal: He exhibits no edema.  Neurological: He is alert. He has normal reflexes. No cranial nerve deficit. He exhibits normal muscle tone. Coordination normal.  Oriented to person, place, but not time. He thought the year was in the 1920s  Skin: He is not diaphoretic.    ED Course  Procedures (including critical care time) Labs Review Labs Reviewed  URINALYSIS, ROUTINE W REFLEX MICROSCOPIC - Abnormal; Notable for the following:    Color, Urine AMBER (*)    Ketones, ur >80 (*)    All other components within normal limits  COMPREHENSIVE METABOLIC PANEL - Abnormal; Notable for the following:    Sodium 135 (*)    Glucose, Bld 106 (*)    Anion gap 17 (*)    All other components within normal limits  CBC WITH DIFFERENTIAL - Abnormal; Notable for the following:    Neutrophils Relative % 86 (*)    Neutro Abs 7.9 (*)    Lymphocytes Relative 10 (*)    All other components within  normal limits  URINE RAPID DRUG SCREEN (HOSP PERFORMED)  ETHANOL    Imaging Review Dg Chest 2 View  10/27/2013   CLINICAL DATA:  Shortness of breath  EXAM: CHEST  2 VIEW  COMPARISON:  06/02/2013  FINDINGS: There is severe bilateral bullous emphysema. There is no focal parenchymal opacity, pleural effusion, or pneumothorax. The heart and mediastinal contours are unremarkable.  The osseous structures are unremarkable.  IMPRESSION: No active cardiopulmonary disease. Severe bilateral bullous emphysema.   Electronically Signed   By: Kathreen Devoid   On: 10/27/2013 13:17     EKG Interpretation   Date/Time:  Thursday October 27 2013 12:33:22 EDT Ventricular Rate:  116 PR Interval:  142 QRS Duration: 79 QT Interval:  299 QTC Calculation: 415 R Axis:   96 Text Interpretation:  Sinus tachycardia Borderline right axis deviation  Baseline wander in lead(s) V5 borderline ST changes no longer evident  Confirmed by [facility]  MD, TREY ([id number]) on 10/27/2013 1:23:17 PM  MDM   Final diagnoses:  Bullous emphysema  Undifferentiated schizophrenia    1:01PM: Patient found altered by neighbor and SOB by EMS with history of shizophrenia and COPD. He responded well to solumedrol 125mg , atrovent and albuterol. He has no complaints although his lungs have diffuse expiratory wheezes bilaterally. Differential includes COPD exacerbation, schizophrenia medication noncompliance, drug use, other infection such as UTI. We will begin assessment with CXR, EKG, CMP, CBC, UA, UDS.  2:05PM: Patient rectal temperature 99. CXR normal, EKG sinus tach, no UTI on U/A, no leukocytosis, CMP notable for borderline hyponatremia (135). Give additional albuterol 5 mg once as he is breathing shallow. Baseline mental status unknown. A&O x 3 in previous documentation this year. Emergency contact listed is mother who he says is deceased. Patient gave me permission to call his brother Blake Mcknight and provided the number [phone] which had  no answer.  2:52PM: Patient removed his PIV and attempted to leave. He got short of breath and had to rest his arms on his knees. We convinced him to return to his room and he agreed to stay if the door is open. I spoke to his brother Blake Mcknight who said he saw the patient in his usual state of health this morning and thought he went missing after the patient went outside to walk. He says his brother goes outside to smoke and gets out of breath. The patient does not know the date or year at baseline. The brother does not have a car but will call his sister Blake Mcknight at [phone] to ask her to come to the ED. We are requesting a sitter and check EtOH.  4:24PM: Patient sister is here. She says this is his baseline mental status. The ambulance picked him up ~1 mile from his house. He was likely dehydrated and out of breath from walking in the heat for a mile with hx of COPD and EMS unaware of his baseline mental status. Current O2 sat mid 90s on RA and not tachypnic. Will have nurse ambulate w continuous pulse ox to consider discharge.  4:30PM: O2 sat 94-96% w ambulation  Kelby Aline, MD 10/27/13 1701

## 2013-10-27 NOTE — ED Notes (Signed)
Patient transported to X-ray 

## 2013-10-27 NOTE — ED Notes (Signed)
Pt tried to urinate for urinalysis but was unable to at this time.  Fluids started and will try again in 30 min.

## 2013-10-27 NOTE — ED Notes (Signed)
Called pt sister and she stated she would be at bedside in 25 min.

## 2013-10-27 NOTE — ED Notes (Signed)
Client ambulated with beginning sat of 96% and ended with 94%.

## 2013-10-27 NOTE — ED Notes (Signed)
Pt became agitated and ripped out his IV causing blood to drop on floor and sheets.  Pt refused to stay in room and security was called.  Pt went back to room and was much calmer.  Md spoke with his brother and his sister agreed to come and talk with pt and MD.

## 2013-10-27 NOTE — ED Notes (Signed)
Pt reports to the ED via [facility] for eval of altered mental status and SOB. Pts neighbor noted he was standing outside "acting strangely." Upon EMS arrival pt was standing on the corner. Pt reports he felt SOB this morning while smoking a cigarette. Pt noted to have wheezing throughout his lung fields. Pt given 10 mg of Albuterol total and 1 of Atrovent as well as 125 mg of Solumedrol in route. Pt alert and oriented to person, place, and situation but not to time. Pts baseline unknown. Pt denies any pain. 12 lead en route showed sinus tach in the 120s. Pt alert, resp e/u, and skin pale, warm, and dry.

## 2013-10-27 NOTE — ED Notes (Signed)
Sitter beside bedside.

## 2013-10-28 NOTE — ED Provider Notes (Signed)
I saw and evaluated the patient, reviewed the resident's note and I agree with the findings and plan.   EKG Interpretation   Date/Time:  Thursday October 27 2013 12:33:22 EDT Ventricular Rate:  116 PR Interval:  142 QRS Duration: 79 QT Interval:  299 QTC Calculation: 415 R Axis:   96 Text Interpretation:  Sinus tachycardia Borderline right axis deviation  Baseline wander in lead(s) V5 borderline ST changes no longer evident  Confirmed by [facility]  MD, TREY ([id number]) on 10/27/2013 1:23:17 PM        Houston Siren III, MD 10/28/13 1310

## 2013-10-28 NOTE — ED Provider Notes (Signed)
I saw and evaluated the patient, reviewed the resident's note and I agree with the findings and plan.   EKG Interpretation   Date/Time:  Thursday October 27 2013 12:33:22 EDT Ventricular Rate:  116 PR Interval:  142 QRS Duration: 79 QT Interval:  299 QTC Calculation: 415 R Axis:   96 Text Interpretation:  Sinus tachycardia Borderline right axis deviation  Baseline wander in lead(s) V5 borderline ST changes no longer evident  Confirmed by [facility]  MD, TREY ([id number]) on 10/27/2013 1:23:17 PM      58 yo male with hx of COPD and schizophrenia presenting with confusion and SOB.  On exam, disoriented to time, but otherwise oriented, able to hold a conversation, but not clear on why he was in the ED, lungs with decreased air movement and mild wheezing, heart sounds normal with RRR, abd soft and nontender.  Given breathing treatments with improvement in his respiratory status.  Family came and provided additional history that he walked off and they were looking for him since.  They report he is at his MS baseline.  Pt appeared to be stable from a respiratory standpoint for outpatient treatment.   Clinical Impression: COPD exacerbation    Houston Siren III, MD 10/28/13 1310

## 2013-12-06 DIAGNOSIS — Z79899 Other long term (current) drug therapy: Secondary | ICD-10-CM | POA: Diagnosis not present

## 2013-12-06 DIAGNOSIS — Z23 Encounter for immunization: Secondary | ICD-10-CM | POA: Diagnosis not present

## 2013-12-06 DIAGNOSIS — J410 Simple chronic bronchitis: Secondary | ICD-10-CM | POA: Diagnosis not present

## 2013-12-06 DIAGNOSIS — E7800 Pure hypercholesterolemia, unspecified: Secondary | ICD-10-CM | POA: Diagnosis not present

## 2013-12-06 DIAGNOSIS — K269 Duodenal ulcer, unspecified as acute or chronic, without hemorrhage or perforation: Secondary | ICD-10-CM | POA: Diagnosis not present

## 2014-01-31 DIAGNOSIS — Z79899 Other long term (current) drug therapy: Secondary | ICD-10-CM | POA: Diagnosis not present

## 2014-01-31 DIAGNOSIS — J449 Chronic obstructive pulmonary disease, unspecified: Secondary | ICD-10-CM | POA: Diagnosis not present

## 2014-01-31 DIAGNOSIS — K269 Duodenal ulcer, unspecified as acute or chronic, without hemorrhage or perforation: Secondary | ICD-10-CM | POA: Diagnosis not present

## 2014-01-31 DIAGNOSIS — E780 Pure hypercholesterolemia: Secondary | ICD-10-CM | POA: Diagnosis not present

## 2014-03-17 DIAGNOSIS — I119 Hypertensive heart disease without heart failure: Secondary | ICD-10-CM | POA: Diagnosis not present

## 2014-03-17 DIAGNOSIS — K269 Duodenal ulcer, unspecified as acute or chronic, without hemorrhage or perforation: Secondary | ICD-10-CM | POA: Diagnosis not present

## 2014-03-17 DIAGNOSIS — M2550 Pain in unspecified joint: Secondary | ICD-10-CM | POA: Diagnosis not present

## 2014-03-17 DIAGNOSIS — J449 Chronic obstructive pulmonary disease, unspecified: Secondary | ICD-10-CM | POA: Diagnosis not present

## 2014-03-17 DIAGNOSIS — Z125 Encounter for screening for malignant neoplasm of prostate: Secondary | ICD-10-CM | POA: Diagnosis not present

## 2014-03-17 DIAGNOSIS — E780 Pure hypercholesterolemia: Secondary | ICD-10-CM | POA: Diagnosis not present

## 2014-04-17 DIAGNOSIS — Z79899 Other long term (current) drug therapy: Secondary | ICD-10-CM | POA: Diagnosis not present

## 2014-04-17 DIAGNOSIS — M2550 Pain in unspecified joint: Secondary | ICD-10-CM | POA: Diagnosis not present

## 2014-04-17 DIAGNOSIS — J449 Chronic obstructive pulmonary disease, unspecified: Secondary | ICD-10-CM | POA: Diagnosis not present

## 2014-04-17 DIAGNOSIS — I119 Hypertensive heart disease without heart failure: Secondary | ICD-10-CM | POA: Diagnosis not present

## 2014-04-17 DIAGNOSIS — K269 Duodenal ulcer, unspecified as acute or chronic, without hemorrhage or perforation: Secondary | ICD-10-CM | POA: Diagnosis not present

## 2014-04-17 DIAGNOSIS — E780 Pure hypercholesterolemia: Secondary | ICD-10-CM | POA: Diagnosis not present

## 2014-04-17 DIAGNOSIS — Z125 Encounter for screening for malignant neoplasm of prostate: Secondary | ICD-10-CM | POA: Diagnosis not present

## 2014-07-24 DIAGNOSIS — Z79899 Other long term (current) drug therapy: Secondary | ICD-10-CM | POA: Diagnosis not present

## 2014-07-24 DIAGNOSIS — J441 Chronic obstructive pulmonary disease with (acute) exacerbation: Secondary | ICD-10-CM | POA: Diagnosis not present

## 2014-07-24 DIAGNOSIS — K269 Duodenal ulcer, unspecified as acute or chronic, without hemorrhage or perforation: Secondary | ICD-10-CM | POA: Diagnosis not present

## 2014-07-24 DIAGNOSIS — E780 Pure hypercholesterolemia: Secondary | ICD-10-CM | POA: Diagnosis not present

## 2014-07-24 DIAGNOSIS — I119 Hypertensive heart disease without heart failure: Secondary | ICD-10-CM | POA: Diagnosis not present

## 2014-07-24 DIAGNOSIS — Z79891 Long term (current) use of opiate analgesic: Secondary | ICD-10-CM | POA: Diagnosis not present

## 2014-07-24 DIAGNOSIS — M2550 Pain in unspecified joint: Secondary | ICD-10-CM | POA: Diagnosis not present

## 2014-11-14 DIAGNOSIS — Z79899 Other long term (current) drug therapy: Secondary | ICD-10-CM | POA: Diagnosis not present

## 2014-11-14 DIAGNOSIS — K269 Duodenal ulcer, unspecified as acute or chronic, without hemorrhage or perforation: Secondary | ICD-10-CM | POA: Diagnosis not present

## 2014-11-14 DIAGNOSIS — E780 Pure hypercholesterolemia: Secondary | ICD-10-CM | POA: Diagnosis not present

## 2014-11-14 DIAGNOSIS — I119 Hypertensive heart disease without heart failure: Secondary | ICD-10-CM | POA: Diagnosis not present

## 2014-11-14 DIAGNOSIS — M2550 Pain in unspecified joint: Secondary | ICD-10-CM | POA: Diagnosis not present

## 2014-11-14 DIAGNOSIS — Z1211 Encounter for screening for malignant neoplasm of colon: Secondary | ICD-10-CM | POA: Diagnosis not present

## 2014-11-14 DIAGNOSIS — J441 Chronic obstructive pulmonary disease with (acute) exacerbation: Secondary | ICD-10-CM | POA: Diagnosis not present

## 2014-11-14 DIAGNOSIS — M159 Polyosteoarthritis, unspecified: Secondary | ICD-10-CM | POA: Diagnosis not present

## 2014-11-14 DIAGNOSIS — F17200 Nicotine dependence, unspecified, uncomplicated: Secondary | ICD-10-CM | POA: Diagnosis not present

## 2014-11-14 DIAGNOSIS — Z0001 Encounter for general adult medical examination with abnormal findings: Secondary | ICD-10-CM | POA: Diagnosis not present

## 2014-11-14 DIAGNOSIS — F319 Bipolar disorder, unspecified: Secondary | ICD-10-CM | POA: Diagnosis not present

## 2014-11-14 DIAGNOSIS — Z79891 Long term (current) use of opiate analgesic: Secondary | ICD-10-CM | POA: Diagnosis not present

## 2015-01-30 DIAGNOSIS — M1990 Unspecified osteoarthritis, unspecified site: Secondary | ICD-10-CM | POA: Diagnosis not present

## 2015-01-30 DIAGNOSIS — K269 Duodenal ulcer, unspecified as acute or chronic, without hemorrhage or perforation: Secondary | ICD-10-CM | POA: Diagnosis not present

## 2015-01-30 DIAGNOSIS — E7800 Pure hypercholesterolemia, unspecified: Secondary | ICD-10-CM | POA: Diagnosis not present

## 2015-01-30 DIAGNOSIS — D751 Secondary polycythemia: Secondary | ICD-10-CM | POA: Diagnosis not present

## 2015-01-30 DIAGNOSIS — J441 Chronic obstructive pulmonary disease with (acute) exacerbation: Secondary | ICD-10-CM | POA: Diagnosis not present

## 2015-01-30 DIAGNOSIS — M2550 Pain in unspecified joint: Secondary | ICD-10-CM | POA: Diagnosis not present

## 2015-01-30 DIAGNOSIS — I119 Hypertensive heart disease without heart failure: Secondary | ICD-10-CM | POA: Diagnosis not present

## 2015-01-30 DIAGNOSIS — F319 Bipolar disorder, unspecified: Secondary | ICD-10-CM | POA: Diagnosis not present

## 2015-01-30 DIAGNOSIS — Z79891 Long term (current) use of opiate analgesic: Secondary | ICD-10-CM | POA: Diagnosis not present

## 2015-01-30 DIAGNOSIS — Z23 Encounter for immunization: Secondary | ICD-10-CM | POA: Diagnosis not present

## 2015-01-30 DIAGNOSIS — F17200 Nicotine dependence, unspecified, uncomplicated: Secondary | ICD-10-CM | POA: Diagnosis not present

## 2015-02-03 IMAGING — RF DG CHOLANGIOGRAM OPERATIVE
1 series · 6 of 6 positions shown · non-contrast
Comparison: ERCP 06/03/2013

CLINICAL DATA: Cholangitis.

EXAM:
INTRAOPERATIVE CHOLANGIOGRAM
TECHNIQUE: Cholangiographic images from the C-arm fluoroscopic device were
submitted for interpretation post-operatively. Please see the
procedural report for the amount of contrast and the fluoroscopy
time utilized.

[Series 1: run · 3 acquisitions, 6 frames shown]
[im 1/3]
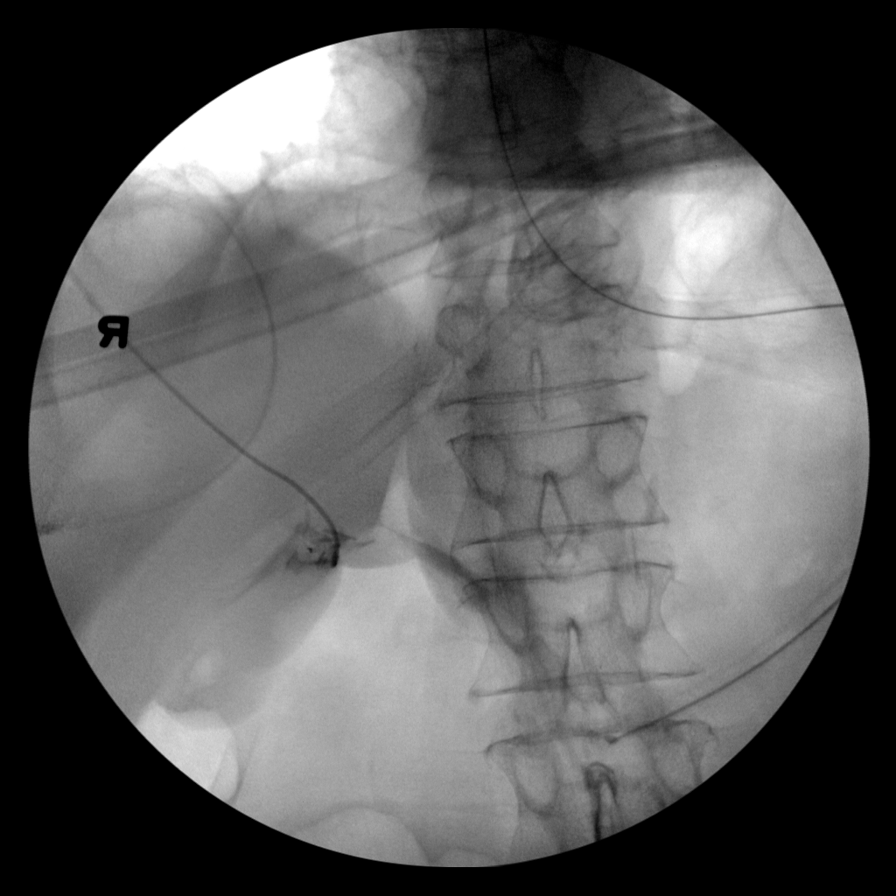
[im 1/3]
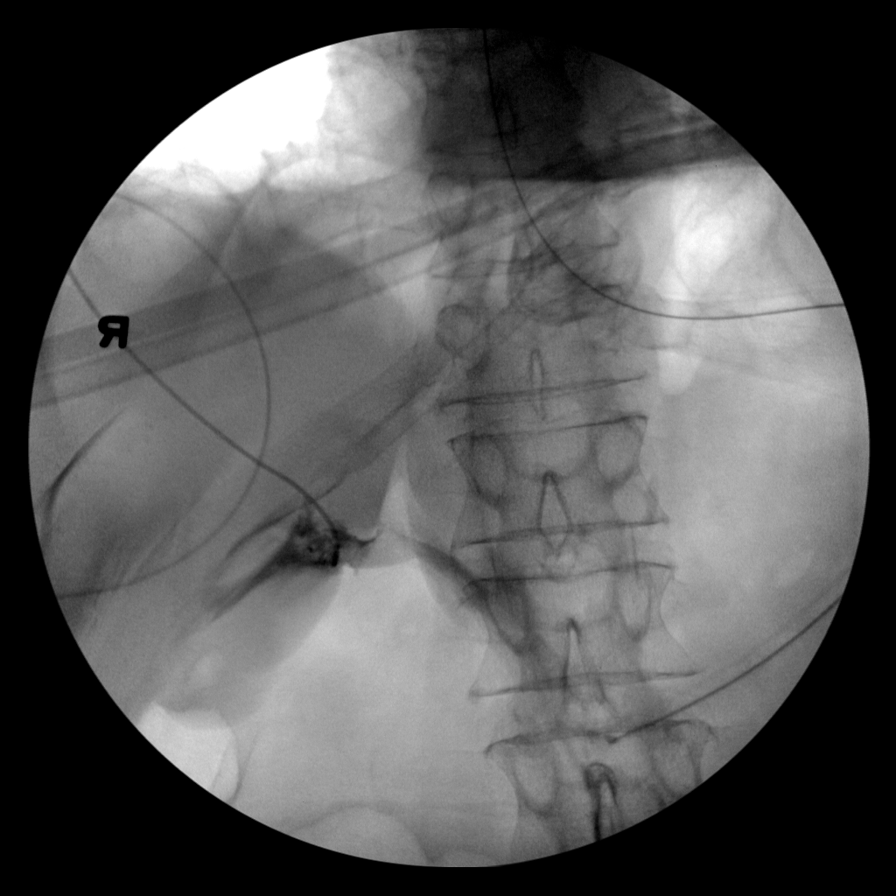
[im 1/3]
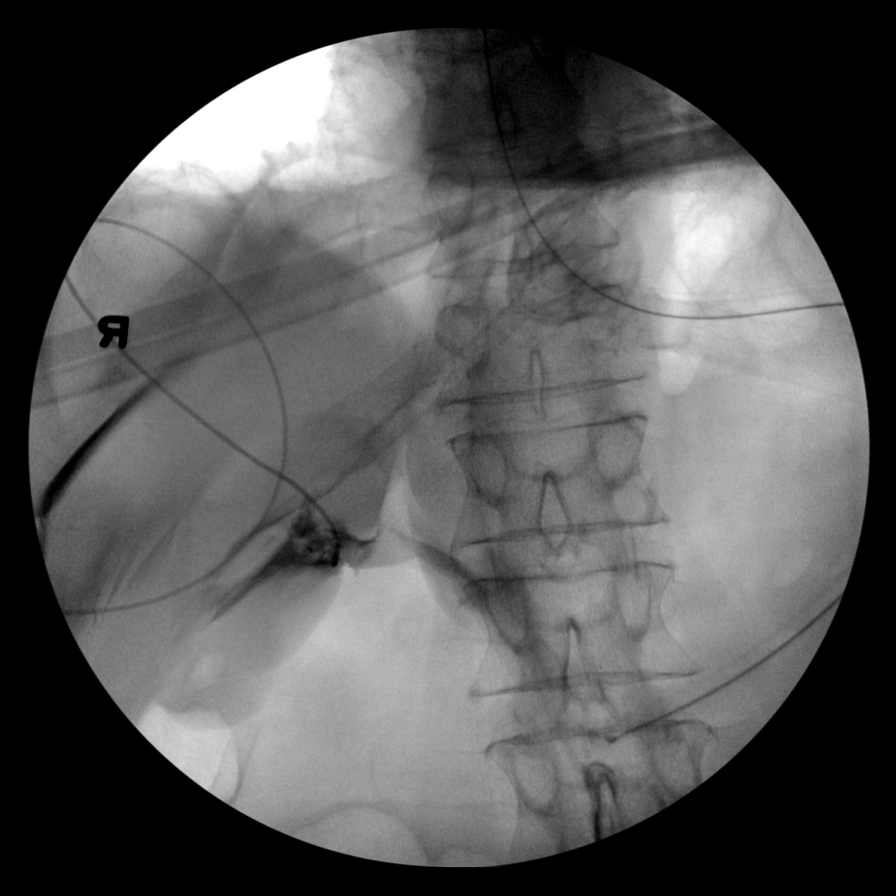
[im 1/3]
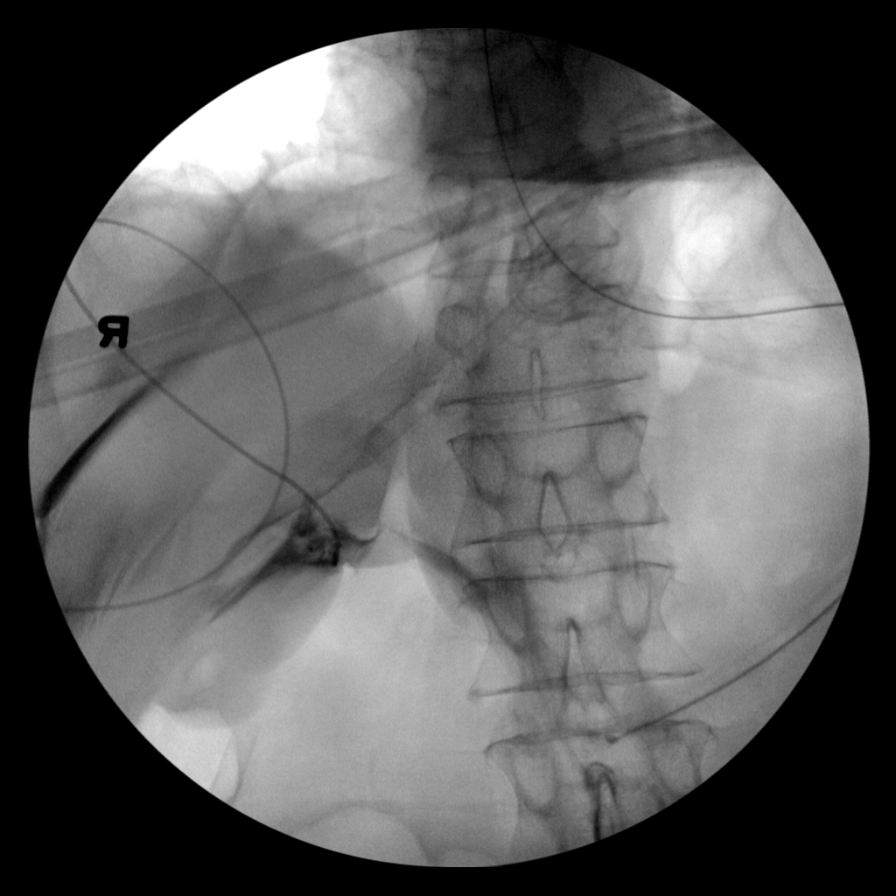
[im 2/3]
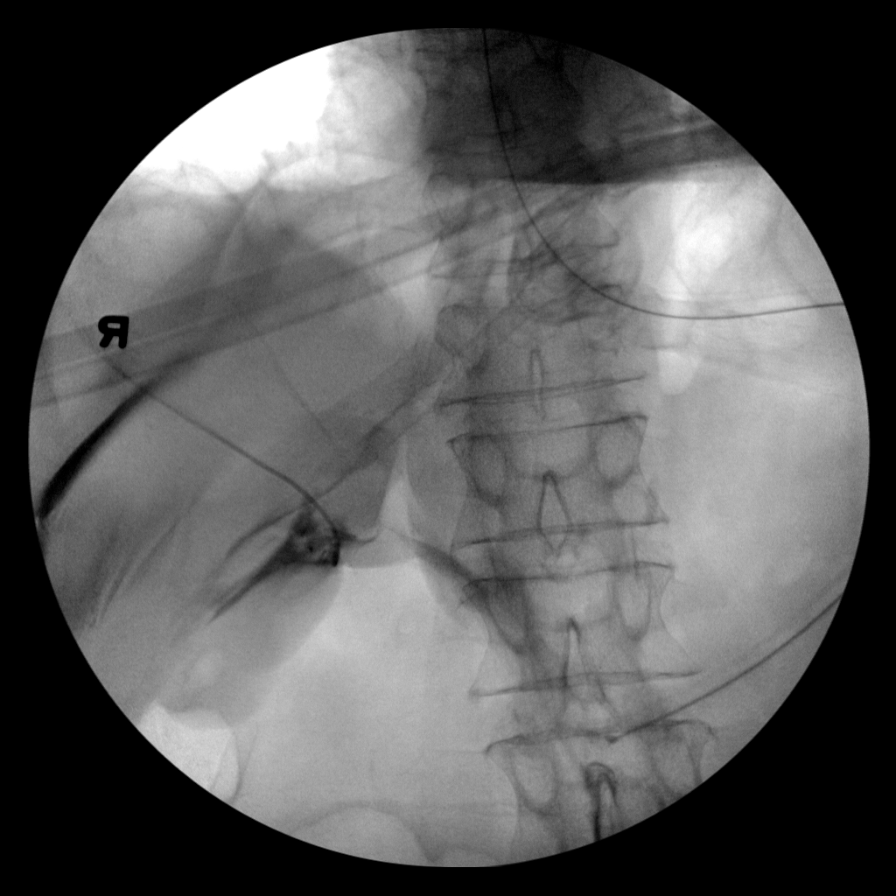
[im 3/3]
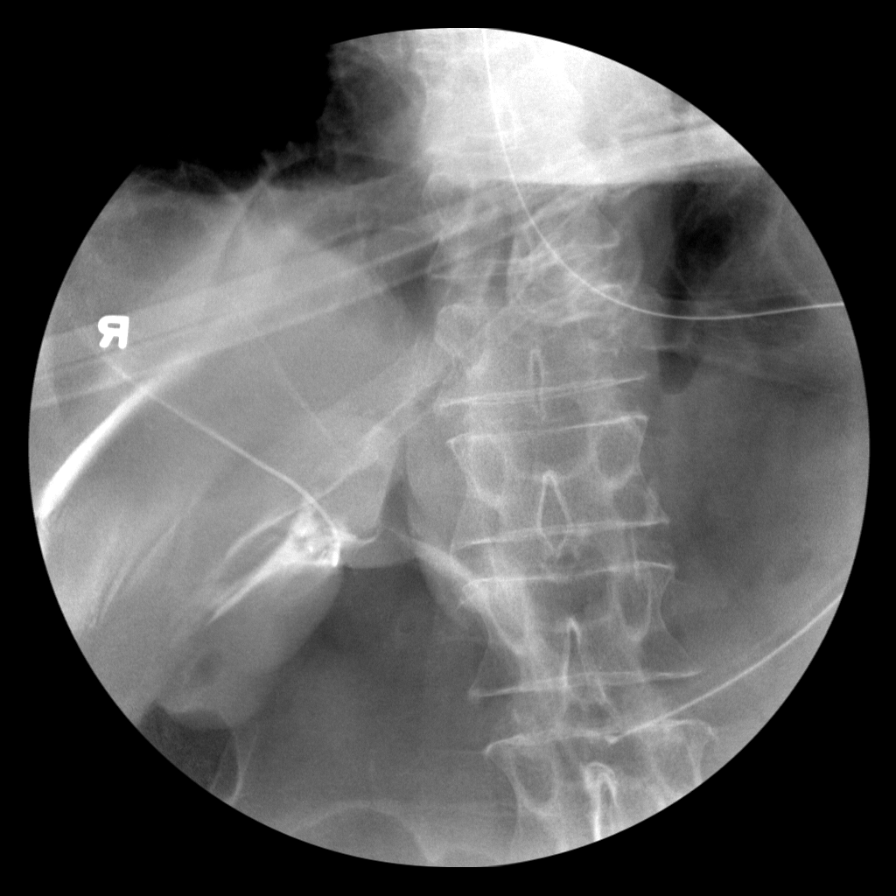

[6 of 6 positions shown; findings below may reference images not displayed]

FINDINGS: An intraoperative cholangiogram was attempted. There is contrast
extravasation in the right upper quadrant. Contrast did not fill the
biliary system.
IMPRESSION: Attempted intraoperative cholangiogram. No significant opacification
of the biliary system.

## 2015-02-05 DIAGNOSIS — Z79891 Long term (current) use of opiate analgesic: Secondary | ICD-10-CM | POA: Diagnosis not present

## 2015-04-03 DIAGNOSIS — J441 Chronic obstructive pulmonary disease with (acute) exacerbation: Secondary | ICD-10-CM | POA: Diagnosis not present

## 2015-04-03 DIAGNOSIS — K267 Chronic duodenal ulcer without hemorrhage or perforation: Secondary | ICD-10-CM | POA: Diagnosis not present

## 2015-04-03 DIAGNOSIS — E7800 Pure hypercholesterolemia, unspecified: Secondary | ICD-10-CM | POA: Diagnosis not present

## 2015-04-03 DIAGNOSIS — D751 Secondary polycythemia: Secondary | ICD-10-CM | POA: Diagnosis not present

## 2015-04-03 DIAGNOSIS — Z79891 Long term (current) use of opiate analgesic: Secondary | ICD-10-CM | POA: Diagnosis not present

## 2015-04-03 DIAGNOSIS — M2550 Pain in unspecified joint: Secondary | ICD-10-CM | POA: Diagnosis not present

## 2015-06-28 DIAGNOSIS — M1990 Unspecified osteoarthritis, unspecified site: Secondary | ICD-10-CM | POA: Diagnosis not present

## 2015-06-28 DIAGNOSIS — F17200 Nicotine dependence, unspecified, uncomplicated: Secondary | ICD-10-CM | POA: Diagnosis not present

## 2015-06-28 DIAGNOSIS — M2550 Pain in unspecified joint: Secondary | ICD-10-CM | POA: Diagnosis not present

## 2015-06-28 DIAGNOSIS — K269 Duodenal ulcer, unspecified as acute or chronic, without hemorrhage or perforation: Secondary | ICD-10-CM | POA: Diagnosis not present

## 2015-06-28 DIAGNOSIS — Z79899 Other long term (current) drug therapy: Secondary | ICD-10-CM | POA: Diagnosis not present

## 2015-06-28 DIAGNOSIS — Z79891 Long term (current) use of opiate analgesic: Secondary | ICD-10-CM | POA: Diagnosis not present

## 2015-06-28 DIAGNOSIS — F319 Bipolar disorder, unspecified: Secondary | ICD-10-CM | POA: Diagnosis not present

## 2015-06-28 DIAGNOSIS — J441 Chronic obstructive pulmonary disease with (acute) exacerbation: Secondary | ICD-10-CM | POA: Diagnosis not present

## 2015-06-28 DIAGNOSIS — E7800 Pure hypercholesterolemia, unspecified: Secondary | ICD-10-CM | POA: Diagnosis not present

## 2015-06-28 DIAGNOSIS — D751 Secondary polycythemia: Secondary | ICD-10-CM | POA: Diagnosis not present

## 2015-09-21 ENCOUNTER — Observation Stay (HOSPITAL_COMMUNITY)
Admission: EM | Admit: 2015-09-21 | Discharge: 2015-09-23 | Disposition: A | Discharge status: Home / Self Care | Payer: Medicare Other | Attending: Internal Medicine | Admitting: Internal Medicine

## 2015-09-21 ENCOUNTER — Emergency Department (HOSPITAL_COMMUNITY): Payer: Medicare Other

## 2015-09-21 ENCOUNTER — Encounter (HOSPITAL_COMMUNITY): Payer: Self-pay | Admitting: Emergency Medicine

## 2015-09-21 DIAGNOSIS — G9340 Encephalopathy, unspecified: Secondary | ICD-10-CM | POA: Diagnosis not present

## 2015-09-21 DIAGNOSIS — E785 Hyperlipidemia, unspecified: Secondary | ICD-10-CM | POA: Diagnosis not present

## 2015-09-21 DIAGNOSIS — R4781 Slurred speech: Secondary | ICD-10-CM | POA: Diagnosis not present

## 2015-09-21 DIAGNOSIS — F17200 Nicotine dependence, unspecified, uncomplicated: Secondary | ICD-10-CM | POA: Diagnosis present

## 2015-09-21 DIAGNOSIS — R262 Difficulty in walking, not elsewhere classified: Secondary | ICD-10-CM | POA: Diagnosis not present

## 2015-09-21 DIAGNOSIS — R4182 Altered mental status, unspecified: Secondary | ICD-10-CM | POA: Diagnosis not present

## 2015-09-21 DIAGNOSIS — I10 Essential (primary) hypertension: Secondary | ICD-10-CM | POA: Diagnosis not present

## 2015-09-21 DIAGNOSIS — E871 Hypo-osmolality and hyponatremia: Secondary | ICD-10-CM | POA: Diagnosis not present

## 2015-09-21 DIAGNOSIS — F17210 Nicotine dependence, cigarettes, uncomplicated: Secondary | ICD-10-CM | POA: Insufficient documentation

## 2015-09-21 DIAGNOSIS — J439 Emphysema, unspecified: Secondary | ICD-10-CM | POA: Diagnosis present

## 2015-09-21 DIAGNOSIS — J449 Chronic obstructive pulmonary disease, unspecified: Secondary | ICD-10-CM | POA: Diagnosis not present

## 2015-09-21 DIAGNOSIS — F209 Schizophrenia, unspecified: Secondary | ICD-10-CM | POA: Diagnosis not present

## 2015-09-21 DIAGNOSIS — R7989 Other specified abnormal findings of blood chemistry: Secondary | ICD-10-CM | POA: Diagnosis present

## 2015-09-21 DIAGNOSIS — R410 Disorientation, unspecified: Secondary | ICD-10-CM | POA: Diagnosis not present

## 2015-09-21 LAB — CBC
HCT: 44.2 % (ref 39.0–52.0)
Hemoglobin: 15.3 g/dL (ref 13.0–17.0)
MCH: 30.1 pg (ref 26.0–34.0)
MCHC: 34.6 g/dL (ref 30.0–36.0)
MCV: 86.8 fL (ref 78.0–100.0)
PLATELETS: 255 10*3/uL (ref 150–400)
RBC: 5.09 MIL/uL (ref 4.22–5.81)
RDW: 14 % (ref 11.5–15.5)
WBC: 8.8 10*3/uL (ref 4.0–10.5)

## 2015-09-21 LAB — I-STAT TROPONIN, ED: Troponin i, poc: 0.01 ng/mL (ref 0.00–0.08)

## 2015-09-21 LAB — COMPREHENSIVE METABOLIC PANEL
ALBUMIN: 3.5 g/dL (ref 3.5–5.0)
ALK PHOS: 69 U/L (ref 38–126)
ALT: 25 U/L (ref 17–63)
ANION GAP: 8 (ref 5–15)
AST: 22 U/L (ref 15–41)
BUN: <5 mg/dL — ABNORMAL LOW (ref 6–20)
CALCIUM: 9.3 mg/dL (ref 8.9–10.3)
CHLORIDE: 99 mmol/L — AB (ref 101–111)
CO2: 25 mmol/L (ref 22–32)
Creatinine, Ser: 0.78 mg/dL (ref 0.61–1.24)
GFR calc Af Amer: >60 mL/min (ref >60)
GFR calc non Af Amer: >60 mL/min (ref >60)
GLUCOSE: 112 mg/dL — AB (ref 65–99)
Potassium: 3.5 mmol/L (ref 3.5–5.1)
SODIUM: 132 mmol/L — AB (ref 135–145)
Total Bilirubin: 0.5 mg/dL (ref 0.3–1.2)
Total Protein: 7.5 g/dL (ref 6.5–8.1)

## 2015-09-21 LAB — I-STAT CHEM 8, ED
BUN: 4 mg/dL — ABNORMAL LOW (ref 6–20)
CALCIUM ION: 1.14 mmol/L (ref 1.13–1.30)
CREATININE: 0.7 mg/dL (ref 0.61–1.24)
Chloride: 96 mmol/L — ABNORMAL LOW (ref 101–111)
GLUCOSE: 112 mg/dL — AB (ref 65–99)
HEMATOCRIT: 48 % (ref 39.0–52.0)
HEMOGLOBIN: 16.3 g/dL (ref 13.0–17.0)
Potassium: 3.6 mmol/L (ref 3.5–5.1)
SODIUM: 136 mmol/L (ref 135–145)
TCO2: 26 mmol/L (ref 0–100)

## 2015-09-21 LAB — DIFFERENTIAL
BASOS PCT: 1 %
Basophils Absolute: 0 10*3/uL (ref 0.0–0.1)
EOS PCT: 2 %
Eosinophils Absolute: 0.1 10*3/uL (ref 0.0–0.7)
LYMPHS PCT: 37 %
Lymphs Abs: 3.3 10*3/uL (ref 0.7–4.0)
Monocytes Absolute: 0.7 10*3/uL (ref 0.1–1.0)
Monocytes Relative: 8 %
NEUTROS ABS: 4.7 10*3/uL (ref 1.7–7.7)
NEUTROS PCT: 52 %

## 2015-09-21 LAB — PROTIME-INR
INR: 0.96 (ref 0.00–1.49)
PROTHROMBIN TIME: 13 s (ref 11.6–15.2)

## 2015-09-21 LAB — ETHANOL: Alcohol, Ethyl (B): <5 mg/dL (ref <5)

## 2015-09-21 LAB — CBG MONITORING, ED: GLUCOSE-CAPILLARY: 111 mg/dL — AB (ref 65–99)

## 2015-09-21 LAB — I-STAT CG4 LACTIC ACID, ED: LACTIC ACID, VENOUS: 2.62 mmol/L — AB (ref 0.5–1.9)

## 2015-09-21 LAB — APTT: aPTT: 35 seconds (ref 24–37)

## 2015-09-21 MED ORDER — SODIUM CHLORIDE 0.9 % IV SOLN
1000.0000 mL | Freq: Once | INTRAVENOUS | Status: AC
  Administered 2015-09-21: 1000 mL via INTRAVENOUS

## 2015-09-21 MED ORDER — SODIUM CHLORIDE 0.9 % IV SOLN
1000.0000 mL | Freq: Once | INTRAVENOUS | Status: AC
  Administered 2015-09-22: 1000 mL via INTRAVENOUS

## 2015-09-21 MED ORDER — SODIUM CHLORIDE 0.9 % IV SOLN
1000.0000 mL | INTRAVENOUS | Status: DC
  Administered 2015-09-22 (×2): 1000 mL via INTRAVENOUS

## 2015-09-21 NOTE — ED Provider Notes (Signed)
CSN: [id number]     Arrival date & time 09/21/15  2147 History  By signing my name below, I, Evelene Croon, attest that this documentation has been prepared under the direction and in the presence of Jola Schmidt, MD . Electronically Signed: Evelene Croon, Scribe. 09/22/2015. 12:31 AM.  Chief Complaint  Patient presents with  . Altered Mental Status   LEVEL 5 CAVEAT DUE TO AMS  The history is provided by a relative and the patient. No language interpreter was used.     HPI Comments:  Blake Mcknight is a 60 y.o. male with a history of HTN, HLD, and COPD, who presents to the Emergency Department for AMS. Family reports slurred speech and near syncope at ~ 2200 this evening. Family states pt also seemed wobbly. He was last seen normal ~ 1430 while sitting outside in the sun. Family states there was a period of time between 1430 and 2200 when the pt was alone. Family denies use of drugs and alcohol.  Family also denies recent diarrhea, cold symptoms, and appetite change. Pt denies pain at this time, however, pt is an unreliable historian. Family reports h/o similar episode in the past after the pt had been sitting out in the sun for a very long period of time.    Past Medical History  Diagnosis Date  . COPD (chronic obstructive pulmonary disease) (HCC)     with bullous emphysema.   Marland Kitchen Heavy cigarette smoker before 2003    pt claims only 10 cigs per day, never heavier amounts.   . Hypertension   . HLD (hyperlipidemia)   . Schizophrenia ([place]) 06/28/2013    This is a chronic condition and he lives with family   Past Surgical History  Procedure Laterality Date  . No past surgeries    . Ercp N/A 06/03/2013    Procedure: ENDOSCOPIC RETROGRADE CHOLANGIOPANCREATOGRAPHY (ERCP);  Surgeon: Ladene Artist, MD;  Location: [facility] ENDOSCOPY;  Service: Endoscopy;  Laterality: N/A;  . Cholecystectomy N/A 06/06/2013    Procedure: LAPAROSCOPIC CHOLECYSTECTOMY WITH ATTEMPTED INTRAOPERATIVE CHOLANGIOGRAM;   Surgeon: Zenovia Jarred, MD;  Location: [place];  Service: General;  Laterality: N/A;   Family History  Problem Relation Age of Onset  . Diabetes Mellitus II Mother   . Diabetes Mother   . CAD Neg Hx   . Stroke Maternal Aunt   . Heart disease Father    Social History  Substance Use Topics  . Smoking status: Current Some Day Smoker -- 1.00 packs/day for 10 years    Types: Cigarettes  . Smokeless tobacco: Former Systems developer    Types: Snuff  . Alcohol Use: No    Review of Systems  Unable to perform ROS: Mental status change   Allergies  Review of patient's allergies indicates no known allergies.  Home Medications   Prior to Admission medications   Medication Sig Start Date End Date Taking? Authorizing Provider  ADVAIR HFA 115-21 MCG/ACT inhaler Inhale 1 puff into the lungs 2 (two) times daily. 09/21/15  Yes Historical Provider, MD  Ascorbic Acid (VITAMIN C) 1000 MG tablet Take 1,000 mg by mouth daily as needed (for immune).   Yes Historical Provider, MD  atorvastatin (LIPITOR) 10 MG tablet Take 10 mg by mouth daily. 09/21/15  Yes Historical Provider, MD  benztropine (COGENTIN) 2 MG tablet Take 2 mg by mouth 2 (two) times daily.  06/01/13  Yes Historical Provider, MD  budesonide-formoterol (SYMBICORT) 160-4.5 MCG/ACT inhaler Inhale 1 puff into the lungs daily as needed (  for SOB).    Yes Historical Provider, MD  cholecalciferol (VITAMIN D) 1000 UNITS tablet Take 1,000 Units by mouth daily.   Yes Historical Provider, MD  Cyanocobalamin (B-12 PO) Take 1 tablet by mouth daily.   Yes Historical Provider, MD  enalapril (VASOTEC) 10 MG tablet Take 10 mg by mouth daily.  05/31/13  Yes Historical Provider, MD  famotidine (PEPCID) 40 MG tablet Take 40 mg by mouth daily as needed for heartburn.    Yes Historical Provider, MD  mirtazapine (REMERON) 30 MG tablet Take 30 mg by mouth at bedtime. 09/18/15  Yes Historical Provider, MD  PROAIR HFA 108 (90 BASE) MCG/ACT inhaler Inhale 1 puff into the lungs every 6  (six) hours as needed for wheezing or shortness of breath.  05/12/13  Yes Historical Provider, MD  risperidone (RISPERDAL) 4 MG tablet Take 4 mg by mouth 2 (two) times daily. 09/18/15  Yes Historical Provider, MD  tiotropium (SPIRIVA) 18 MCG inhalation capsule Place 1 capsule (18 mcg total) into inhaler and inhale daily. 06/08/13  Yes Marianne L York, PA-C   BP 114/83 mmHg  Pulse 108  Temp(Src) 98.6 F (37 C) (Oral)  Resp 25  Ht 5\' 8"  (1.727 m)  Wt 160 lb (72.576 kg)  BMI 24.33 kg/m2  SpO2 95% Physical Exam  Constitutional: He appears well-developed and well-nourished.  HENT:  Head: Normocephalic and atraumatic.  Eyes: EOM are normal. Pupils are equal, round, and reactive to light.  Neck: Normal range of motion.  Cardiovascular: Regular rhythm, normal heart sounds and intact distal pulses.  Tachycardia present.   Pulmonary/Chest: Effort normal and breath sounds normal. No respiratory distress.  Abdominal: Soft. He exhibits no distension. There is no tenderness.  Musculoskeletal: Normal range of motion.  Neurological:  No facial asymmetry . Slurred speech. Holds all 4 extremities against gravity. Limited neurological exam secondary to AMS.    Skin: Skin is warm and dry.  Psychiatric: He has a normal mood and affect. Judgment normal.  Nursing note and vitals reviewed.   ED Course  Procedures   DIAGNOSTIC STUDIES:  Oxygen Saturation is 93% on RA, adequate by my interpretation.    Labs Review Labs Reviewed  COMPREHENSIVE METABOLIC PANEL - Abnormal; Notable for the following:    Sodium 132 (*)    Chloride 99 (*)    Glucose, Bld 112 (*)    BUN <5 (*)    All other components within normal limits  CBG MONITORING, ED - Abnormal; Notable for the following:    Glucose-Capillary 111 (*)    All other components within normal limits  I-STAT CHEM 8, ED - Abnormal; Notable for the following:    Chloride 96 (*)    BUN 4 (*)    Glucose, Bld 112 (*)    All other components within normal  limits  I-STAT CG4 LACTIC ACID, ED - Abnormal; Notable for the following:    Lactic Acid, Venous 2.62 (*)    All other components within normal limits  PROTIME-INR  APTT  CBC  DIFFERENTIAL  ETHANOL  URINALYSIS, ROUTINE W REFLEX MICROSCOPIC (NOT AT [facility])  AMMONIA  URINE RAPID DRUG SCREEN, HOSP PERFORMED  BLOOD GAS, ARTERIAL  I-STAT TROPOININ, ED  I-STAT CG4 LACTIC ACID, ED    Imaging Review Ct Head Wo Contrast  09/21/2015  CLINICAL DATA:  Slurred speech and confusion. Last seen normal 14:00 EXAM: CT HEAD WITHOUT CONTRAST TECHNIQUE: Contiguous axial images were obtained from the base of the skull through the vertex without intravenous  contrast. COMPARISON:  None. FINDINGS: Brain: No intracranial hemorrhage, mass effect, or midline shift. No hydrocephalus. The basilar cisterns are patent. No evidence of territorial infarct. No intracranial fluid collection. Vascular: No hyperdense vessel or abnormal calcification. Skull:  Calvarium is intact. Sinuses/Orbits: Visualized orbits are unremarkable. Included paranasal sinuses and mastoid air cells are well aerated. Other: None. IMPRESSION: No acute intracranial abnormality. Electronically Signed   By: Jeb Levering M.D.   On: 09/21/2015 22:38   Mr Brain Wo Contrast  09/22/2015  CLINICAL DATA:  Slurred speech. Altered mental status. History of hypertension, hyperlipidemia and schizophrenia. EXAM: MRI HEAD WITHOUT CONTRAST TECHNIQUE: Multiplanar, multiecho pulse sequences of the brain and surrounding structures were obtained without intravenous contrast. COMPARISON:  CT HEAD September 21, 2015 FINDINGS: Multiple sequences are mildly or moderately motion degraded. INTRACRANIAL CONTENTS: No reduced diffusion to suggest acute ischemia. Gradient sequences moderately motion degraded. Linear susceptibility artifact RIGHT parietal lobe most compatible with developmental venous anomaly. No susceptibility artifact to suggest hemorrhage. The ventricles and sulci are  normal for patient's age. No suspicious parenchymal signal, masses or mass effect. No abnormal extra-axial fluid collections. No extra-axial masses though, contrast enhanced sequences would be more sensitive. Normal major intracranial vascular flow voids present at skull base. ORBITS: The included ocular globes and orbital contents are non-suspicious. SINUSES: The mastoid air-cells and included paranasal sinuses are well-aerated. SKULL/SOFT TISSUES: No abnormal sellar expansion. No suspicious calvarial bone marrow signal. Craniocervical junction maintained. Patient is edentulous. IMPRESSION: No acute intracranial process on this motion degraded examination. RIGHT parietal developmental venous anomaly, otherwise negative MRI head. Electronically Signed   By: Elon Alas M.D.   On: 09/22/2015 02:56   Dg Chest Portable 1 View  09/22/2015  CLINICAL DATA:  Acute onset of slurred speech and difficulty talking. Confusion. Initial encounter. EXAM: PORTABLE CHEST 1 VIEW COMPARISON:  CT of the head performed 10/27/2013 FINDINGS: The lungs are well-aerated. Underlying emphysema is noted. Scattered bilateral atelectasis or scarring is seen. A 1.0 cm nodule at the right midlung zone is stable from 2015 and likely benign. There is no evidence of pleural effusion or pneumothorax. The cardiomediastinal silhouette is within normal limits. No acute osseous abnormalities are seen. IMPRESSION: No acute cardiopulmonary process seen. Emphysema, with underlying atelectasis or scarring. Stable 1.0 cm nodule at the right midlung zone is likely benign. Electronically Signed   By: Garald Balding M.D.   On: 09/22/2015 00:04   I have personally reviewed and evaluated these images and lab results as part of my medical decision-making.   EKG Interpretation   Date/Time:  Friday September 21 2015 22:00:19 EDT Ventricular Rate:  128 PR Interval:  134 QRS Duration: 80 QT Interval:  290 QTC Calculation: 423 R Axis:   94 Text  Interpretation:  Sinus tachycardia Rightward axis Septal infarct ,  age undetermined Abnormal ECG No significant change was found Confirmed by  Isatou Agredano  MD, Lennette Bihari ([id number]) on 09/21/2015 11:13:37 PM      MDM   Final diagnoses:  None    Unclear etiology of AMS. Doubt meningitis. MRI without abnormality. hospitalist admission  I personally performed the services described in this documentation, which was scribed in my presence. The recorded information has been reviewed and is accurate.      Jola Schmidt, MD 09/22/15 [phone]

## 2015-09-21 NOTE — ED Notes (Signed)
Per pt family member, pt was found not feeling himself. Unknown last seen normal, possibly 2pm. Pt has slurred speech, couldn't stand up, couldn't hardly talk. Pt difficulty speaking. Pt Tachycardiac at 130. Pt states he does not drink alcohol. Pt appears very confused. Does not know the year or how old he is.

## 2015-09-21 NOTE — ED Notes (Addendum)
Pt screened by Nurse first.  Family reports slurred speech and difficulty talking. Pt confused. Wife last saw pt this morning.  Multiple attempts to determine last known well from family.  Son reports he saw him at 2pm sitting outside.  States speech was normal at that time.

## 2015-09-22 ENCOUNTER — Emergency Department (HOSPITAL_COMMUNITY): Payer: Medicare Other

## 2015-09-22 ENCOUNTER — Encounter (HOSPITAL_COMMUNITY): Payer: Self-pay | Admitting: Family Medicine

## 2015-09-22 DIAGNOSIS — E871 Hypo-osmolality and hyponatremia: Secondary | ICD-10-CM | POA: Diagnosis present

## 2015-09-22 DIAGNOSIS — R400 Somnolence: Secondary | ICD-10-CM

## 2015-09-22 DIAGNOSIS — G9340 Encephalopathy, unspecified: Secondary | ICD-10-CM | POA: Diagnosis not present

## 2015-09-22 DIAGNOSIS — E872 Acidosis: Secondary | ICD-10-CM

## 2015-09-22 DIAGNOSIS — R4182 Altered mental status, unspecified: Secondary | ICD-10-CM | POA: Diagnosis not present

## 2015-09-22 DIAGNOSIS — F203 Undifferentiated schizophrenia: Secondary | ICD-10-CM

## 2015-09-22 DIAGNOSIS — J439 Emphysema, unspecified: Secondary | ICD-10-CM | POA: Diagnosis not present

## 2015-09-22 DIAGNOSIS — R7989 Other specified abnormal findings of blood chemistry: Secondary | ICD-10-CM | POA: Diagnosis present

## 2015-09-22 LAB — URINALYSIS, ROUTINE W REFLEX MICROSCOPIC
BILIRUBIN URINE: NEGATIVE
Glucose, UA: NEGATIVE mg/dL
HGB URINE DIPSTICK: NEGATIVE
KETONES UR: NEGATIVE mg/dL
Leukocytes, UA: NEGATIVE
Nitrite: NEGATIVE
PH: 7 (ref 5.0–8.0)
Protein, ur: NEGATIVE mg/dL
SPECIFIC GRAVITY, URINE: 1.011 (ref 1.005–1.030)

## 2015-09-22 LAB — RAPID URINE DRUG SCREEN, HOSP PERFORMED
AMPHETAMINES: NOT DETECTED
BARBITURATES: NOT DETECTED
BENZODIAZEPINES: NOT DETECTED
Cocaine: NOT DETECTED
Opiates: NOT DETECTED
TETRAHYDROCANNABINOL: NOT DETECTED

## 2015-09-22 LAB — I-STAT CG4 LACTIC ACID, ED: Lactic Acid, Venous: 1.68 mmol/L (ref 0.5–1.9)

## 2015-09-22 LAB — I-STAT ARTERIAL BLOOD GAS, ED
ACID-BASE DEFICIT: 3 mmol/L — AB (ref 0.0–2.0)
Bicarbonate: 22.5 mEq/L (ref 20.0–24.0)
O2 SAT: 92 %
PH ART: 7.357 (ref 7.350–7.450)
TCO2: 24 mmol/L (ref 0–100)
pCO2 arterial: 40 mmHg (ref 35.0–45.0)
pO2, Arterial: 67 mmHg — ABNORMAL LOW (ref 80.0–100.0)

## 2015-09-22 LAB — AMMONIA: Ammonia: 17 umol/L (ref 9–35)

## 2015-09-22 MED ORDER — LORAZEPAM 2 MG/ML IJ SOLN
1.0000 mg | Freq: Once | INTRAMUSCULAR | Status: AC
  Administered 2015-09-22: 1 mg via INTRAVENOUS
  Filled 2015-09-22: qty 1

## 2015-09-22 MED ORDER — PREDNISONE 20 MG PO TABS
40.0000 mg | ORAL_TABLET | Freq: Every day | ORAL | Status: DC
  Administered 2015-09-22 – 2015-09-23 (×2): 40 mg via ORAL
  Filled 2015-09-22 (×2): qty 2

## 2015-09-22 MED ORDER — ENOXAPARIN SODIUM 40 MG/0.4ML ~~LOC~~ SOLN
40.0000 mg | Freq: Every day | SUBCUTANEOUS | Status: DC
  Administered 2015-09-22 – 2015-09-23 (×2): 40 mg via SUBCUTANEOUS
  Filled 2015-09-22 (×2): qty 0.4

## 2015-09-22 MED ORDER — MOMETASONE FURO-FORMOTEROL FUM 200-5 MCG/ACT IN AERO
2.0000 | INHALATION_SPRAY | Freq: Two times a day (BID) | RESPIRATORY_TRACT | Status: DC
Start: 2015-09-22 — End: 2015-09-23
  Administered 2015-09-22 – 2015-09-23 (×2): 2 via RESPIRATORY_TRACT
  Filled 2015-09-22 (×2): qty 8.8

## 2015-09-22 MED ORDER — ACETAMINOPHEN 325 MG PO TABS: 650.0000 mg | ORAL_TABLET | Freq: Four times a day (QID) | ORAL | Status: DC | PRN

## 2015-09-22 MED ORDER — TIOTROPIUM BROMIDE MONOHYDRATE 18 MCG IN CAPS
18.0000 ug | ORAL_CAPSULE | Freq: Every day | RESPIRATORY_TRACT | Status: DC
  Administered 2015-09-23: 18 ug via RESPIRATORY_TRACT
  Filled 2015-09-22 (×2): qty 5

## 2015-09-22 MED ORDER — ACETAMINOPHEN 650 MG RE SUPP: 650.0000 mg | Freq: Four times a day (QID) | RECTAL | Status: DC | PRN

## 2015-09-22 MED ORDER — IPRATROPIUM-ALBUTEROL 0.5-2.5 (3) MG/3ML IN SOLN
3.0000 mL | Freq: Four times a day (QID) | RESPIRATORY_TRACT | Status: DC
  Administered 2015-09-22 – 2015-09-23 (×5): 3 mL via RESPIRATORY_TRACT
  Filled 2015-09-22 (×5): qty 3

## 2015-09-22 MED ORDER — LORAZEPAM 2 MG/ML IJ SOLN: 1.0000 mg | Freq: Once | INTRAMUSCULAR | Status: DC | PRN

## 2015-09-22 NOTE — H&P (Signed)
History and Physical  Patient Name: Blake Mcknight     [id number]    DOB: Nov 27, 1955    DOA: 09/21/2015 PCP: Leola Brazil, MD   Patient coming from: Home  Chief Complaint: Confusion and slurred speech  HPI: Blake Mcknight is a 60 y.o. male with a past medical history significant for schizophrenia and emphysema with FEV1 35% active smoker who presents with confusion for 1 day.  All history is provided by the patient's family as the patient is unable to provide history for himself. They report that he was in his normal state of health, he lives with them, he is ambulatory and able to hold a conversation, but is unable to manage his own medicines or other IADLs.  Earlier in the day he had no confusion, disorientation, nor vomiting/diarrhea/decreased PO intake, fever, cough, syncope, fatigue, malaise, or weakness. Around 6 PM however, his family noticed that he was moving slower, appeared weaker, and had to hold on to something in order not to fall. They also noticed that he had slurred speech and seemed disoriented. They're unsure if he was able to get into any of his meds, and do not think anyone else in the house had prescription pain medicines or other substances, although they are not sure.   ED course: -Afebrile, sinus tachycardia, tachypnea, BP normal, saturating well on room air -Na 132, K 3.5, Cr 0.8, WBC 8.8 K, Hgb 15.3, alcohol normal, troponin negative -Lactate 2.62, resolved with fluids -Urinalysis clear and chest x-ray showed old known emphysema with no focal opacity -CT head normal, follow-up MRI with infarction or other findings to explain altered mental status  He was given 2 L normal saline, and TRH asked to evaluate for admission.     ROS: Unable to obtain due to patient mentation, but family report as above.    Past Medical History  Diagnosis Date  . COPD (chronic obstructive pulmonary disease) (HCC)     with bullous emphysema.   Marland Kitchen Heavy  cigarette smoker before 2003    pt claims only 10 cigs per day, never heavier amounts.   . Hypertension   . HLD (hyperlipidemia)   . Schizophrenia ([place]) 06/28/2013    This is a chronic condition and he lives with family    Past Surgical History  Procedure Laterality Date  . No past surgeries    . Ercp N/A 06/03/2013    Procedure: ENDOSCOPIC RETROGRADE CHOLANGIOPANCREATOGRAPHY (ERCP);  Surgeon: Ladene Artist, MD;  Location: [facility] ENDOSCOPY;  Service: Endoscopy;  Laterality: N/A;  . Cholecystectomy N/A 06/06/2013    Procedure: LAPAROSCOPIC CHOLECYSTECTOMY WITH ATTEMPTED INTRAOPERATIVE CHOLANGIOGRAM;  Surgeon: Zenovia Jarred, MD;  Location: [place];  Service: General;  Laterality: N/A;    Social History: Patient lives With his sister and family.  The patient walks unassisted. He smokes daily, but family try to limit him to "one cigarette every one half hours". He is not take alcohol. Family do not think he has access to illicit drugs. He does not leave the house much. He is normally able to hold a conversation, but is dependent for all IADLs because of schizophrenia.    No Known Allergies  Family history: family history includes Diabetes in his mother; Diabetes Mellitus II in his mother; Heart disease in his father; Stroke in his maternal aunt. There is no history of CAD.  Prior to Admission medications   Medication Sig Start Date End Date Taking? Authorizing Provider  ADVAIR [facility] 115-21 MCG/ACT inhaler Inhale 1  puff into the lungs 2 (two) times daily. 09/21/15  Yes Historical Provider, MD  Ascorbic Acid (VITAMIN C) 1000 MG tablet Take 1,000 mg by mouth daily as needed (for immune).   Yes Historical Provider, MD  atorvastatin (LIPITOR) 10 MG tablet Take 10 mg by mouth daily. 09/21/15  Yes Historical Provider, MD  benztropine (COGENTIN) 2 MG tablet Take 2 mg by mouth 2 (two) times daily.  06/01/13  Yes Historical Provider, MD  budesonide-formoterol (SYMBICORT) 160-4.5 MCG/ACT inhaler Inhale 1 puff  into the lungs daily as needed (for SOB).    Yes Historical Provider, MD  cholecalciferol (VITAMIN D) 1000 UNITS tablet Take 1,000 Units by mouth daily.   Yes Historical Provider, MD  Cyanocobalamin (B-12 PO) Take 1 tablet by mouth daily.   Yes Historical Provider, MD  enalapril (VASOTEC) 10 MG tablet Take 10 mg by mouth daily.  05/31/13  Yes Historical Provider, MD  famotidine (PEPCID) 40 MG tablet Take 40 mg by mouth daily as needed for heartburn.    Yes Historical Provider, MD  mirtazapine (REMERON) 30 MG tablet Take 30 mg by mouth at bedtime. 09/18/15  Yes Historical Provider, MD  PROAIR HFA 108 (90 BASE) MCG/ACT inhaler Inhale 1 puff into the lungs every 6 (six) hours as needed for wheezing or shortness of breath.  05/12/13  Yes Historical Provider, MD  risperidone (RISPERDAL) 4 MG tablet Take 4 mg by mouth 2 (two) times daily. 09/18/15  Yes Historical Provider, MD  tiotropium (SPIRIVA) 18 MCG inhalation capsule Place 1 capsule (18 mcg total) into inhaler and inhale daily. 06/08/13  Yes Melton Alar, PA-C       Physical Exam: BP 130/92 mmHg  Pulse 96  Temp(Src) 97.2 F (36.2 C) (Rectal)  Resp 26  Ht 5\' 8"  (1.727 m)  Wt 72.576 kg (160 lb)  BMI 24.33 kg/m2  SpO2 96% General appearance: Well-developed, adult male, awake but confused and mumbling and in no acute distress.   Eyes: Conjunctiva normal, lids and lashes normal.   PERRL.  ENT: No nasal deformity, discharge.  OP dry without lesions.  Edentulous. Lymph: No cervical or supraclavicular lymphadenopathy. Skin: Warm and dry.  No suspicious rashes or lesions. Cardiac: Tachycardic, nl S1-S2, no murmurs appreciated.  Capillary refill is brisk.  JVP normal.  No LE edema.  Radial and DP pulses 2+ and symmetric. Respiratory: Normal respiratory rate and rhythm.  Very wheezy throughout. GI: Abdomen soft without rigidity.  No TTP. No ascites, distension.  No hepatosplenomegaly appreciated.   MSK: No deformities or effusions.  No  clubbing/cyanosis. Neuro: Somnolent.  Mumbles that he is in [place].  Thinks it is June.  States he is 60 years old. Moves arms and legs symmetrically, 4/5 strength.     Psych: Unable to assess.    Labs on Admission:  I have personally reviewed following labs and imaging studies: CBC:  Recent Labs Lab 09/21/15 2158 09/21/15 2212  WBC 8.8  --   NEUTROABS 4.7  --   HGB 15.3 16.3  HCT 44.2 48.0  MCV 86.8  --   PLT 255  --    Basic Metabolic Panel:  Recent Labs Lab 09/21/15 2158 09/21/15 2212  NA 132* 136  K 3.5 3.6  CL 99* 96*  CO2 25  --   GLUCOSE 112* 112*  BUN <5* 4*  CREATININE 0.78 0.70  CALCIUM 9.3  --    GFR: Estimated Creatinine Clearance: 98.6 mL/min (by C-G formula based on Cr of 0.7).  Liver Function Tests:  Recent Labs Lab 09/21/15 2158  AST 22  ALT 25  ALKPHOS 69  BILITOT 0.5  PROT 7.5  ALBUMIN 3.5   Coagulation Profile:  Recent Labs Lab 09/21/15 2158  INR 0.96   CBG:  Recent Labs Lab 09/21/15 2203  GLUCAP 111*   Sepsis Labs: Lactic acid 2.62, improved to 1.6 with fluids.       Radiological Exams on Admission: Personally reviewed: Ct Head Wo Contrast  09/21/2015  CLINICAL DATA:  Slurred speech and confusion. Last seen normal 14:00 EXAM: CT HEAD WITHOUT CONTRAST TECHNIQUE: Contiguous axial images were obtained from the base of the skull through the vertex without intravenous contrast. COMPARISON:  None. FINDINGS: Brain: No intracranial hemorrhage, mass effect, or midline shift. No hydrocephalus. The basilar cisterns are patent. No evidence of territorial infarct. No intracranial fluid collection. Vascular: No hyperdense vessel or abnormal calcification. Skull:  Calvarium is intact. Sinuses/Orbits: Visualized orbits are unremarkable. Included paranasal sinuses and mastoid air cells are well aerated. Other: None. IMPRESSION: No acute intracranial abnormality. Electronically Signed   By: Jeb Levering M.D.   On: 09/21/2015 22:38    Mr Brain Wo Contrast  09/22/2015  CLINICAL DATA:  Slurred speech. Altered mental status. History of hypertension, hyperlipidemia and schizophrenia. EXAM: MRI HEAD WITHOUT CONTRAST TECHNIQUE: Multiplanar, multiecho pulse sequences of the brain and surrounding structures were obtained without intravenous contrast. COMPARISON:  CT HEAD September 21, 2015 FINDINGS: Multiple sequences are mildly or moderately motion degraded. INTRACRANIAL CONTENTS: No reduced diffusion to suggest acute ischemia. Gradient sequences moderately motion degraded. Linear susceptibility artifact RIGHT parietal lobe most compatible with developmental venous anomaly. No susceptibility artifact to suggest hemorrhage. The ventricles and sulci are normal for patient's age. No suspicious parenchymal signal, masses or mass effect. No abnormal extra-axial fluid collections. No extra-axial masses though, contrast enhanced sequences would be more sensitive. Normal major intracranial vascular flow voids present at skull base. ORBITS: The included ocular globes and orbital contents are non-suspicious. SINUSES: The mastoid air-cells and included paranasal sinuses are well-aerated. SKULL/SOFT TISSUES: No abnormal sellar expansion. No suspicious calvarial bone marrow signal. Craniocervical junction maintained. Patient is edentulous. IMPRESSION: No acute intracranial process on this motion degraded examination. RIGHT parietal developmental venous anomaly, otherwise negative MRI head. Electronically Signed   By: Elon Alas M.D.   On: 09/22/2015 02:56   Dg Chest Portable 1 View  09/22/2015  CLINICAL DATA:  Acute onset of slurred speech and difficulty talking. Confusion. Initial encounter. EXAM: PORTABLE CHEST 1 VIEW COMPARISON:  CT of the head performed 10/27/2013 FINDINGS: The lungs are well-aerated. Underlying emphysema is noted. Scattered bilateral atelectasis or scarring is seen. A 1.0 cm nodule at the right midlung zone is stable from 2015 and likely  benign. There is no evidence of pleural effusion or pneumothorax. The cardiomediastinal silhouette is within normal limits. No acute osseous abnormalities are seen. IMPRESSION: No acute cardiopulmonary process seen. Emphysema, with underlying atelectasis or scarring. Stable 1.0 cm nodule at the right midlung zone is likely benign. Electronically Signed   By: Garald Balding M.D.   On: 09/22/2015 00:04    EKG: Independently reviewed. Weight 128, QTC 423, sinus rhythm, no ST changes.    Assessment/Plan 1. Confusion and somnolence:  Suspect this is combination of dehdyration (given lactic acidosis and hyponatremia and tachycardia resolving with fluids) and medication effects (risperidone).  Hypercarbia or hyperammonemia may be contributing as well.  MRI normal. -ABG -Ammonia -UDS -Hold psychotropics -Hydrate and monitor mental status   2.  COPD:  -Continue Symbicort and spiriva -Duonebs q6hrs -Prednisone 40 mg for 5 days  3. HTN:  -Hold enalapril while hydrating  4. Schizophrenia:  -Hold all psychotropics (Risperdal, benztrop, mirtaz) for now  5. Hyponatremia:  -Repeat BMP tomorrow after fluids  6. Smoking:  -Cessation recommended -Nicotine patch  7. Elevated lactic acid:  The patient is noted to have a lactate>2. With the current information available to me, I do not think the patient has sepsis or infection. The lactate is likely related to liver failure , drug / toxin overdose and respiratory distress/ respiratory failurehypercarbia.      DVT prophylaxis: Lovenox  Code Status: FULL  Family Communication: Sister and family at bedside  Disposition Plan: Anticipate monitoring for clearing during the day today.  Follow ABG and ammonia and UDS.  If all tests normal, and cleared after fluids and holding risperidone, discharge to home, otherwise, further targeted workup as indicated by studies/exam. Consults called: None Admission status: OBS, med surg At the point of initial  evaluation, it is my clinical opinion that admission for OBSERVATION is reasonable and necessary because the patient's presenting complaints in the context of their chronic conditions represent sufficient risk of deterioration or significant morbidity to constitute reasonable grounds for close observation in the hospital setting, but that the patient may be medically stable for discharge from the hospital within 24 to 48 hours.    Medical decision making: Patient seen at 6:20 AM on 09/22/2015.  The patient was discussed with Dr. Venora Maples. What exists of the patient's chart was reviewed in depth.  Clinical condition: stable.        Edwin Dada [facility] Pager [phone]

## 2015-09-22 NOTE — ED Notes (Signed)
Dr. Danford at bedside  

## 2015-09-22 NOTE — Evaluation (Signed)
Clinical/Bedside Swallow Evaluation Patient Details  Name: Blake Mcknight MRN: [id number] Date of Birth: 30-Mar-1955  Today's Date: 09/22/2015 Time: SLP Start Time (ACUTE ONLY): [id number] SLP Stop Time (ACUTE ONLY): 1426 SLP Time Calculation (min) (ACUTE ONLY): 23 min  Past Medical History:  Past Medical History  Diagnosis Date  . COPD (chronic obstructive pulmonary disease) (HCC)     with bullous emphysema.   Marland Kitchen Heavy cigarette smoker before 2003    pt claims only 10 cigs per day, never heavier amounts.   . Hypertension   . HLD (hyperlipidemia)   . Schizophrenia ([place]) 06/28/2013    This is a chronic condition and he lives with family   Past Surgical History:  Past Surgical History  Procedure Laterality Date  . No past surgeries    . Ercp N/A 06/03/2013    Procedure: ENDOSCOPIC RETROGRADE CHOLANGIOPANCREATOGRAPHY (ERCP);  Surgeon: Ladene Artist, MD;  Location: [facility] ENDOSCOPY;  Service: Endoscopy;  Laterality: N/A;  . Cholecystectomy N/A 06/06/2013    Procedure: LAPAROSCOPIC CHOLECYSTECTOMY WITH ATTEMPTED INTRAOPERATIVE CHOLANGIOGRAM;  Surgeon: Zenovia Jarred, MD;  Location: [place];  Service: General;  Laterality: N/A;   HPI:  60 y.o. male with a past medical history significant for schizophrenia and emphysema with FEV1 35% active smoker who presents with confusion for 1 day; nursing has no concerns with swallowing, but speech has been different from baseline per family   Assessment / Plan / Recommendation Clinical Impression   Pt did not exhibit any overt s/s of aspiration with any consistency given during BSE, but d/t AMS and pt responding different from baseline, ? SLE need and safety with entire meal d/t impulsivity/confusion during BSE; hx of mental illness may be contributory to pragmatic changes.  ST will f/u x1 for diet tolerance and need for SLE.   Aspiration Risk  Mild aspiration risk    Diet Recommendation     Medication Administration: Whole meds with liquid    Other   Recommendations Oral Care Recommendations: Oral care BID   Follow up Recommendations  24 hour supervision/assistance    Frequency and Duration min 1 x/week  1 week       Prognosis Prognosis for Safe Diet Advancement: Good Barriers to Reach Goals: Cognitive deficits      Swallow Study   General Date of Onset: 09/21/15 HPI: 60 y.o. male with a past medical history significant for schizophrenia and emphysema with FEV1 35% active smoker who presents with confusion for 1 day. Type of Study: Bedside Swallow Evaluation Diet Prior to this Study: Regular;Thin liquids Temperature Spikes Noted: No Respiratory Status: Room air History of Recent Intubation: No Behavior/Cognition: Alert;Confused;Impulsive Oral Cavity Assessment: Within Functional Limits Oral Care Completed by SLP: No Oral Cavity - Dentition: Poor condition;Missing dentition Vision: Functional for self-feeding Self-Feeding Abilities: Able to feed self;Needs assist Patient Positioning: Upright in bed Baseline Vocal Quality: Low vocal intensity Volitional Cough: Strong Volitional Swallow: Able to elicit    Oral/Motor/Sensory Function Overall Oral Motor/Sensory Function: Other (comment)   Ice Chips Ice chips: Within functional limits (DIfficult to assess d/t refusal with portions of OME) Presentation: Spoon   Thin Liquid Thin Liquid: Within functional limits Presentation: Cup;Spoon    Nectar Thick Nectar Thick Liquid: Not tested   Honey Thick Honey Thick Liquid: Not tested   Puree Puree: Within functional limits Presentation: Spoon   Solid      Solid: Within functional limits Presentation: Self Fed    Functional Assessment Tool Used: NOMS Functional Limitations: Swallowing Swallow Current  Status [phone]): At least 1 percent but less than 20 percent impaired, limited or restricted Swallow Goal Status [id number]): At least 1 percent but less than 20 percent impaired, limited or restricted   Zulma Court,PAT, M.S.,  CCC-SLP 09/22/2015,2:48 PM

## 2015-09-22 NOTE — Progress Notes (Signed)
Patient ID: Blake Mcknight, male   DOB: 1955-08-08, 60 y.o.   MRN: [id number] Pt admitted after midnight so for details please refer to admission note 09/22/2015.  [name], slurred speech for 1 day prior to the admission. Pt was hemodynamically stable on admission. CT head and MRI brain with no acute intracranial findings. CXR showed emphysema.   Assessment and Plan  Acute encephalopathy - Unclear etiology - Seems to be better since admission - Obtain PT eval  Leisa Lenz [facility] [id number]

## 2015-09-23 DIAGNOSIS — J449 Chronic obstructive pulmonary disease, unspecified: Secondary | ICD-10-CM

## 2015-09-23 DIAGNOSIS — F17200 Nicotine dependence, unspecified, uncomplicated: Secondary | ICD-10-CM | POA: Diagnosis not present

## 2015-09-23 DIAGNOSIS — G9340 Encephalopathy, unspecified: Secondary | ICD-10-CM | POA: Diagnosis not present

## 2015-09-23 LAB — BASIC METABOLIC PANEL
ANION GAP: 6 (ref 5–15)
BUN: 5 mg/dL — AB (ref 6–20)
CALCIUM: 8.5 mg/dL — AB (ref 8.9–10.3)
CO2: 25 mmol/L (ref 22–32)
Chloride: 103 mmol/L (ref 101–111)
Creatinine, Ser: 0.67 mg/dL (ref 0.61–1.24)
GLUCOSE: 93 mg/dL (ref 65–99)
Potassium: 3.7 mmol/L (ref 3.5–5.1)
Sodium: 134 mmol/L — ABNORMAL LOW (ref 135–145)

## 2015-09-23 LAB — CBC
HCT: 37.1 % — ABNORMAL LOW (ref 39.0–52.0)
Hemoglobin: 12.4 g/dL — ABNORMAL LOW (ref 13.0–17.0)
MCH: 29.6 pg (ref 26.0–34.0)
MCHC: 33.4 g/dL (ref 30.0–36.0)
MCV: 88.5 fL (ref 78.0–100.0)
Platelets: 224 10*3/uL (ref 150–400)
RBC: 4.19 MIL/uL — ABNORMAL LOW (ref 4.22–5.81)
RDW: 14 % (ref 11.5–15.5)
WBC: 8.5 10*3/uL (ref 4.0–10.5)

## 2015-09-23 MED ORDER — IPRATROPIUM-ALBUTEROL 0.5-2.5 (3) MG/3ML IN SOLN: 3.0000 mL | Freq: Three times a day (TID) | RESPIRATORY_TRACT | Status: DC

## 2015-09-23 NOTE — Progress Notes (Signed)
Discharge instructions reviewed with patient's sister. Staff assisted patient to family vehicle.

## 2015-09-23 NOTE — Discharge Summary (Signed)
Physician Discharge Summary  Blake Mcknight [id number] DOB: 1955/06/21 DOA: 09/21/2015  PCP: Blake Brazil, MD  Admit date: 09/21/2015 Discharge date: 09/23/2015  Recommendations for Outpatient Follow-up:  Unclear cause of altered mental status. CT head did not show acute finding. No stroke evidence on MRI brain. HH PT order placed  Discharge Diagnoses:  Principal Problem:   Altered mental status Active Problems:   Tobacco use disorder   Schizophrenia (HCC)   Bullous emphysema (HCC)   Hyponatremia   Elevated lactic acid level   Acute encephalopathy    Discharge Condition: stable   Diet recommendation: as tolerated   History of present illness:  [name], slurred speech for 1 day prior to the admission.  Pt was hemodynamically stable on admission. CT head and MRI brain with no acute intracranial findings. CXR showed emphysema.    Hospital Course:   Acute encephalopathy - Unclear etiology - Resolved at this point - CT head and MRI brain without acute findings  - HH PT order placed  - CXR negative other than 1 nodule 1 cm in right mid lung likely benign   Tobacco use - Counseled on cessation  COPD - Stable respiratory status - Continue home bronchodilators     Signed:  Leisa Lenz, MD  [facility] 09/23/2015, 12:03 PM  Pager #: [phone]  Time spent in minutes: less than 30 minutes    Discharge Exam: Filed Vitals:   09/23/15 0657 09/23/15 1015  BP: 113/73 139/53  Pulse: 73 63  Temp: 97.8 F (36.6 C) 98.9 F (37.2 C)  Resp: 20 18   Filed Vitals:   09/22/15 2243 09/23/15 0129 09/23/15 0657 09/23/15 1015  BP: 156/88  113/73 139/53  Pulse: 83  73 63  Temp:   97.8 F (36.6 C) 98.9 F (37.2 C)  TempSrc:   Oral Oral  Resp:   20 18  Height:      Weight:      SpO2:  98% 97%  97%    General: Pt is alert, follows commands appropriately, not in acute distress Cardiovascular: Regular rate and rhythm, S1/S2 (+) Respiratory: Clear to auscultation bilaterally, no wheezing, no crackles, no rhonchi Abdominal: Soft, non tender, non distended, bowel sounds +, no guarding Extremities: no edema, no cyanosis, pulses palpable bilaterally DP and PT Neuro: Grossly nonfocal  Discharge Instructions  Discharge Instructions    Call MD for:  difficulty breathing, headache or visual disturbances    Complete by:  As directed      Call MD for:  persistant dizziness or light-headedness    Complete by:  As directed      Call MD for:  persistant nausea and vomiting    Complete by:  As directed      Call MD for:  severe uncontrolled pain    Complete by:  As directed      Diet - low sodium heart healthy    Complete by:  As directed      Discharge instructions    Complete by:  As directed   Unclear cause of altered mental status. CT head did not show acute finding. No stroke evidence on MRI brain. HH PT order placed     Increase activity slowly    Complete by:  As directed             Medication List  TAKE these medications        ADVAIR HFA 115-21 MCG/ACT inhaler  Generic drug:  fluticasone-salmeterol  Inhale 1 puff into the lungs 2 (two) times daily.     atorvastatin 10 MG tablet  Commonly known as:  LIPITOR  Take 10 mg by mouth daily.     B-12 PO  Take 1 tablet by mouth daily.     benztropine 2 MG tablet  Commonly known as:  COGENTIN  Take 2 mg by mouth 2 (two) times daily.     budesonide-formoterol 160-4.5 MCG/ACT inhaler  Commonly known as:  SYMBICORT  Inhale 1 puff into the lungs daily as needed (for SOB).     cholecalciferol 1000 units tablet  Commonly known as:  VITAMIN D  Take 1,000 Units by mouth daily.     enalapril 10 MG tablet  Commonly known as:  VASOTEC  Take 10 mg by mouth daily.     famotidine 40 MG tablet  Commonly known as:  PEPCID   Take 40 mg by mouth daily as needed for heartburn.     mirtazapine 30 MG tablet  Commonly known as:  REMERON  Take 30 mg by mouth at bedtime.     PROAIR HFA 108 (90 Base) MCG/ACT inhaler  Generic drug:  albuterol  Inhale 1 puff into the lungs every 6 (six) hours as needed for wheezing or shortness of breath.     risperidone 4 MG tablet  Commonly known as:  RISPERDAL  Take 4 mg by mouth 2 (two) times daily.     tiotropium 18 MCG inhalation capsule  Commonly known as:  SPIRIVA  Place 1 capsule (18 mcg total) into inhaler and inhale daily.     vitamin C 1000 MG tablet  Take 1,000 mg by mouth daily as needed (for immune).           Follow-up Information    Follow up with [facility],GEORGE R, MD. Schedule an appointment as soon as possible for a visit in 1 week.   Specialty:  Pulmonary Disease   Why:  Follow up appt after recent hospitalization   Contact information:   [place] [address] [phone]        The results of significant diagnostics from this hospitalization (including imaging, microbiology, ancillary and laboratory) are listed below for reference.    Significant Diagnostic Studies: Ct Head Mcknight Contrast  09/21/2015  CLINICAL DATA:  Slurred speech and confusion. Last seen normal 14:00 EXAM: CT HEAD WITHOUT CONTRAST TECHNIQUE: Contiguous axial images were obtained from the base of the skull through the vertex without intravenous contrast. COMPARISON:  None. FINDINGS: Brain: No intracranial hemorrhage, mass effect, or midline shift. No hydrocephalus. The basilar cisterns are patent. No evidence of territorial infarct. No intracranial fluid collection. Vascular: No hyperdense vessel or abnormal calcification. Skull:  Calvarium is intact. Sinuses/Orbits: Visualized orbits are unremarkable. Included paranasal sinuses and mastoid air cells are well aerated. Other: None. IMPRESSION: No acute intracranial abnormality. Electronically Signed   By:  Jeb Levering M.D.   On: 09/21/2015 22:38   Blake Mcknight Contrast  09/22/2015  CLINICAL DATA:  Slurred speech. Altered mental status. History of hypertension, hyperlipidemia and schizophrenia. EXAM: MRI HEAD WITHOUT CONTRAST TECHNIQUE: Multiplanar, multiecho pulse sequences of the brain and surrounding structures were obtained without intravenous contrast. COMPARISON:  CT HEAD September 21, 2015 FINDINGS: Multiple sequences are mildly or moderately motion degraded. INTRACRANIAL CONTENTS: No reduced diffusion to suggest acute ischemia. Gradient sequences moderately  motion degraded. Linear susceptibility artifact RIGHT parietal lobe most compatible with developmental venous anomaly. No susceptibility artifact to suggest hemorrhage. The ventricles and sulci are normal for patient's age. No suspicious parenchymal signal, masses or mass effect. No abnormal extra-axial fluid collections. No extra-axial masses though, contrast enhanced sequences would be more sensitive. Normal major intracranial vascular flow voids present at skull base. ORBITS: The included ocular globes and orbital contents are non-suspicious. SINUSES: The mastoid air-cells and included paranasal sinuses are well-aerated. SKULL/SOFT TISSUES: No abnormal sellar expansion. No suspicious calvarial bone marrow signal. Craniocervical junction maintained. Patient is edentulous. IMPRESSION: No acute intracranial process on this motion degraded examination. RIGHT parietal developmental venous anomaly, otherwise negative MRI head. Electronically Signed   By: Elon Alas M.D.   On: 09/22/2015 02:56   Dg Chest Portable 1 View  09/22/2015  CLINICAL DATA:  Acute onset of slurred speech and difficulty talking. Confusion. Initial encounter. EXAM: PORTABLE CHEST 1 VIEW COMPARISON:  CT of the head performed 10/27/2013 FINDINGS: The lungs are well-aerated. Underlying emphysema is noted. Scattered bilateral atelectasis or scarring is seen. A 1.0 cm nodule at the  right midlung zone is stable from 2015 and likely benign. There is no evidence of pleural effusion or pneumothorax. The cardiomediastinal silhouette is within normal limits. No acute osseous abnormalities are seen. IMPRESSION: No acute cardiopulmonary process seen. Emphysema, with underlying atelectasis or scarring. Stable 1.0 cm nodule at the right midlung zone is likely benign. Electronically Signed   By: Garald Balding M.D.   On: 09/22/2015 00:04    Microbiology: No results found for this or any previous visit (from the past 240 hour(s)).   Labs: Basic Metabolic Panel:  Recent Labs Lab 09/21/15 2158 09/21/15 2212 09/23/15 0829  NA 132* 136 134*  K 3.5 3.6 3.7  CL 99* 96* 103  CO2 25  --  25  GLUCOSE 112* 112* 93  BUN <5* 4* 5*  CREATININE 0.78 0.70 0.67  CALCIUM 9.3  --  8.5*   Liver Function Tests:  Recent Labs Lab 09/21/15 2158  AST 22  ALT 25  ALKPHOS 69  BILITOT 0.5  PROT 7.5  ALBUMIN 3.5   No results for input(s): LIPASE, AMYLASE in the last 168 hours.  Recent Labs Lab 09/22/15 0616  AMMONIA 17   CBC:  Recent Labs Lab 09/21/15 2158 09/21/15 2212 09/23/15 0829  WBC 8.8  --  8.5  NEUTROABS 4.7  --   --   HGB 15.3 16.3 12.4*  HCT 44.2 48.0 37.1*  MCV 86.8  --  88.5  PLT 255  --  224   Cardiac Enzymes: No results for input(s): CKTOTAL, CKMB, CKMBINDEX, TROPONINI in the last 168 hours. BNP: BNP (last 3 results) No results for input(s): BNP in the last 8760 hours.  ProBNP (last 3 results) No results for input(s): PROBNP in the last 8760 hours.  CBG:  Recent Labs Lab 09/21/15 2203  GLUCAP 111*

## 2015-09-23 NOTE — Evaluation (Signed)
Physical Therapy Evaluation Patient Details Name: Blake Mcknight MRN: [id number] DOB: January 06, 1956 Today's Date: 09/23/2015   History of Present Illness  Pt is a [name], slurred speech for 1 day prior to the admission.  CT head and MRI brain with no acute intracranial findings. CXR showed emphysema.    Clinical Impression  Pt admitted with above diagnosis. Pt currently with functional limitations due to the deficits listed below (see PT Problem List).  Pt receives assist from his sister PTA for bathing, dressing.   Blake Mcknight presents with impaired balance with high level balance activities, specifically with head turns and when stepping over objects.  He would benefit from OPPT to address these impairments.  Sister, Blake Mcknight available to provide 24/7 assist/supervision at d/c. Pt will benefit from skilled PT to increase their independence and safety with mobility to allow discharge to the venue listed below.      Follow Up Recommendations Outpatient PT;Supervision/Assistance - 24 hour (to address higher level balance impairments)    Equipment Recommendations  None recommended by PT    Recommendations for Other Services       Precautions / Restrictions Precautions Precautions: Fall;Other (comment) Precaution Comments: monitor O2 Restrictions Weight Bearing Restrictions: No      Mobility  Bed Mobility Overal bed mobility: Independent             General bed mobility comments: No cues or physical assist needed. HOB flat.  Transfers Overall transfer level: Needs assistance Equipment used: None Transfers: Sit to/from Stand Sit to Stand: Supervision         General transfer comment: Supervision for safety.  Slight instability but no physical assist needed.  Ambulation/Gait Ambulation/Gait assistance: Min  guard Ambulation Distance (Feet): 200 Feet Assistive device: None Gait Pattern/deviations: Step-through pattern;Decreased stride length;Staggering left;Staggering right   Gait velocity interpretation: Below normal speed for age/gender General Gait Details: Pt staggers Lt/Rt with higher level balance activities as documented below.  Pt able to steady without physical assist.  Stairs Stairs: Yes Stairs assistance: Supervision Stair Management: No rails;Step to pattern;Forwards;One rail Right Number of Stairs: 2 General stair comments: Holds Rt rail with ascent and no rail on descent.  Supervision for safety.  Wheelchair Mobility    Modified Rankin (Stroke Patients Only)       Balance Overall balance assessment: Needs assistance Sitting-balance support: No upper extremity supported;Feet supported Sitting balance-Leahy Scale: Good     Standing balance support: No upper extremity supported;During functional activity Standing balance-Leahy Scale: Fair                   Standardized Balance Assessment Standardized Balance Assessment : Dynamic Gait Index   Dynamic Gait Index Level Surface: Mild Impairment (decreased gait speed) Change in Gait Speed: Severe Impairment ("I can't go any faster") Gait with Horizontal Head Turns: Moderate Impairment Gait with Vertical Head Turns: Moderate Impairment Gait and Pivot Turn: Normal Step Over Obstacle: Normal       Pertinent Vitals/Pain Pain Assessment: No/denies pain    Home Living Family/patient expects to be discharged to:: Private residence Living Arrangements: Other relatives (sister) Available Help at Discharge: Family;Available 24 hours/day Type of Home: House Home Access: Stairs to enter Entrance Stairs-Rails: Chemical engineer of Steps: 2 Home Layout: One level Home Equipment: None      Prior Function Level of Independence: Needs assistance  Gait / Transfers Assistance Needed: Ambulates  without AD, pt and sister deny h/o falls  ADL's / Homemaking Assistance Needed: Needs assist showering, dressing.  Sister does the cooking and cleaning.        Hand Dominance        Extremity/Trunk Assessment   Upper Extremity Assessment: Overall WFL for tasks assessed           Lower Extremity Assessment: Overall WFL for tasks assessed      Cervical / Trunk Assessment: Kyphotic  Communication   Communication: No difficulties  Cognition Arousal/Alertness: Awake/alert Behavior During Therapy: Flat affect;Restless Overall Cognitive Status: History of cognitive impairments - at baseline                      General Comments General comments (skin integrity, edema, etc.): SpO2 remains at or above 92% on RA throughout session.    Exercises        Assessment/Plan    PT Assessment Patient needs continued PT services  PT Diagnosis Difficulty walking   PT Problem List Decreased balance;Decreased safety awareness;Cardiopulmonary status limiting activity  PT Treatment Interventions DME instruction;Gait training;Stair training;Functional mobility training;Therapeutic activities;Therapeutic exercise;Balance training;Cognitive remediation;Patient/family education   PT Goals (Current goals can be found in the Care Plan section) Acute Rehab PT Goals Patient Stated Goal: none stated PT Goal Formulation: With patient/family Time For Goal Achievement: 10/03/15 Potential to Achieve Goals: Good    Frequency Min 3X/week   Barriers to discharge        Co-evaluation               End of Session Equipment Utilized During Treatment: Gait belt Activity Tolerance: Patient tolerated treatment well Patient left: in bed;with call bell/phone within reach;with bed alarm set;with family/visitor present;Other (comment) (Pt sitting EOB) Nurse Communication: Mobility status    Functional Assessment Tool Used: Clinical Judgement Functional Limitation: Mobility: Walking  and moving around Mobility: Walking and Moving Around Current Status [id number]): At least 1 percent but less than 20 percent impaired, limited or restricted Mobility: Walking and Moving Around Goal Status [phone]): At least 1 percent but less than 20 percent impaired, limited or restricted    Time: [id number] PT Time Calculation (min) (ACUTE ONLY): 24 min   Charges:   PT Evaluation $PT Eval Low Complexity: 1 Procedure PT Treatments $Gait Training: 8-22 mins   PT G Codes:   PT G-Codes **NOT FOR INPATIENT CLASS** Functional Assessment Tool Used: Clinical Judgement Functional Limitation: Mobility: Walking and moving around Mobility: Walking and Moving Around Current Status [id number]): At least 1 percent but less than 20 percent impaired, limited or restricted Mobility: Walking and Moving Around Goal Status [phone]): At least 1 percent but less than 20 percent impaired, limited or restricted    Collie Siad PT, DPT  Pager: [phone] Phone: [phone] 09/23/2015, 11:34 AM

## 2015-09-23 NOTE — Discharge Instructions (Signed)
Confusion Confusion is the inability to think with your usual speed or clarity. Confusion may come on quickly or slowly over time. How quickly the confusion comes on depends on the cause. Confusion can be due to any number of causes. CAUSES   Concussion, head injury, or head trauma.  Seizures.  Stroke.  Fever.  Brain tumor.  Age related decreased brain function (dementia).  Heightened emotional states like rage or terror.  Mental illness in which the person loses the ability to determine what is real and what is not (hallucinations).  Infections such as a urinary tract infection (UTI).  Toxic effects from alcohol, drugs, or prescription medicines.  Dehydration and an imbalance of salts in the body (electrolytes).  Lack of sleep.  Low blood sugar (diabetes).  Low levels of oxygen from conditions such as chronic lung disorders.  Drug interactions or other medicine side effects.  Nutritional deficiencies, especially niacin, thiamine, vitamin C, or vitamin B.  Sudden drop in body temperature (hypothermia).  Change in routine, such as when traveling or hospitalized. SIGNS AND SYMPTOMS  People often describe their thinking as cloudy or unclear when they are confused. Confusion can also include feeling disoriented. That means you are unaware of where or who you are. You may also not know what the date or time is. If confused, you may also have difficulty paying attention, remembering, and making decisions. Some people also act aggressively when they are confused.  DIAGNOSIS  The medical evaluation of confusion may include:  Blood and urine tests.  X-rays.  Brain and nervous system tests.  Analyzing your brain waves (electroencephalogram or EEG).  Magnetic resonance imaging (MRI) of your head.  Computed tomography (CT) scan of your head.  Mental status tests in which your health care provider may ask many questions. Some of these questions may seem silly or strange,  but they are a very important test to help diagnose and treat confusion. TREATMENT  An admission to the hospital may not be needed, but a person with confusion should not be left alone. Stay with a family member or friend until the confusion clears. Avoid alcohol, pain relievers, or sedative drugs until you have fully recovered. Do not drive until directed by your health care provider. HOME CARE INSTRUCTIONS  What family and friends can do:  To find out if someone is confused, ask the person to state his or her name, age, and the date. If the person is unsure or answers incorrectly, he or she is confused.  Always introduce yourself, no matter how well the person knows you.  Often remind the person of his or her location.  Place a calendar and clock near the confused person.  Help the person with his or her medicines. You may want to use a pill box, an alarm as a reminder, or give the person each dose as prescribed.  Talk about current events and plans for the day.  Try to keep the environment calm, quiet, and peaceful.  Make sure the person keeps follow-up visits with his or her health care provider. PREVENTION  Ways to prevent confusion:  Avoid alcohol.  Eat a balanced diet.  Get enough sleep.  Take medicine only as directed by your health care provider.  Do not become isolated. Spend time with other people and make plans for your days.  Keep careful watch on your blood sugar levels if you are diabetic. SEEK IMMEDIATE MEDICAL CARE IF:   You develop severe headaches, repeated vomiting, seizures, blackouts, or   slurred speech.  There is increasing confusion, weakness, numbness, restlessness, or personality changes.  You develop a loss of balance, have marked dizziness, feel uncoordinated, or fall.  You have delusions, hallucinations, or develop severe anxiety.  Your family members think you need to be rechecked.   This information is not intended to replace advice given  to you by your health care provider. Make sure you discuss any questions you have with your health care provider.   Document Released: 04/10/2004 Document Revised: 03/24/2014 Document Reviewed: 04/08/2013 Elsevier Interactive Patient Education 2016 [employer]  

## 2015-09-25 DIAGNOSIS — K267 Chronic duodenal ulcer without hemorrhage or perforation: Secondary | ICD-10-CM | POA: Diagnosis not present

## 2015-09-25 DIAGNOSIS — M2550 Pain in unspecified joint: Secondary | ICD-10-CM | POA: Diagnosis not present

## 2015-09-25 DIAGNOSIS — F319 Bipolar disorder, unspecified: Secondary | ICD-10-CM | POA: Diagnosis not present

## 2015-09-25 DIAGNOSIS — D751 Secondary polycythemia: Secondary | ICD-10-CM | POA: Diagnosis not present

## 2015-09-25 DIAGNOSIS — Z79891 Long term (current) use of opiate analgesic: Secondary | ICD-10-CM | POA: Diagnosis not present

## 2015-09-25 DIAGNOSIS — J449 Chronic obstructive pulmonary disease, unspecified: Secondary | ICD-10-CM | POA: Diagnosis not present

## 2015-09-25 DIAGNOSIS — M1990 Unspecified osteoarthritis, unspecified site: Secondary | ICD-10-CM | POA: Diagnosis not present

## 2015-09-25 DIAGNOSIS — Z5181 Encounter for therapeutic drug level monitoring: Secondary | ICD-10-CM | POA: Diagnosis not present

## 2015-09-25 DIAGNOSIS — Z79899 Other long term (current) drug therapy: Secondary | ICD-10-CM | POA: Diagnosis not present

## 2015-09-25 DIAGNOSIS — Z720 Tobacco use: Secondary | ICD-10-CM | POA: Diagnosis not present

## 2015-09-25 DIAGNOSIS — E7800 Pure hypercholesterolemia, unspecified: Secondary | ICD-10-CM | POA: Diagnosis not present

## 2016-01-17 DIAGNOSIS — I119 Hypertensive heart disease without heart failure: Secondary | ICD-10-CM | POA: Diagnosis not present

## 2016-01-17 DIAGNOSIS — M2550 Pain in unspecified joint: Secondary | ICD-10-CM | POA: Diagnosis not present

## 2016-01-17 DIAGNOSIS — Z125 Encounter for screening for malignant neoplasm of prostate: Secondary | ICD-10-CM | POA: Diagnosis not present

## 2016-01-17 DIAGNOSIS — Z720 Tobacco use: Secondary | ICD-10-CM | POA: Diagnosis not present

## 2016-01-17 DIAGNOSIS — Z23 Encounter for immunization: Secondary | ICD-10-CM | POA: Diagnosis not present

## 2016-01-17 DIAGNOSIS — J441 Chronic obstructive pulmonary disease with (acute) exacerbation: Secondary | ICD-10-CM | POA: Diagnosis not present

## 2016-01-17 DIAGNOSIS — F319 Bipolar disorder, unspecified: Secondary | ICD-10-CM | POA: Diagnosis not present

## 2016-01-17 DIAGNOSIS — D751 Secondary polycythemia: Secondary | ICD-10-CM | POA: Diagnosis not present

## 2016-01-17 DIAGNOSIS — M1990 Unspecified osteoarthritis, unspecified site: Secondary | ICD-10-CM | POA: Diagnosis not present

## 2016-01-17 DIAGNOSIS — Z79899 Other long term (current) drug therapy: Secondary | ICD-10-CM | POA: Diagnosis not present

## 2016-01-17 DIAGNOSIS — Z79891 Long term (current) use of opiate analgesic: Secondary | ICD-10-CM | POA: Diagnosis not present

## 2016-01-17 DIAGNOSIS — E7800 Pure hypercholesterolemia, unspecified: Secondary | ICD-10-CM | POA: Diagnosis not present

## 2016-01-17 DIAGNOSIS — K267 Chronic duodenal ulcer without hemorrhage or perforation: Secondary | ICD-10-CM | POA: Diagnosis not present

## 2016-05-24 ENCOUNTER — Emergency Department (HOSPITAL_COMMUNITY): Payer: Medicare Other

## 2016-05-24 ENCOUNTER — Inpatient Hospital Stay (HOSPITAL_COMMUNITY)
Admission: EM | Admit: 2016-05-24 | Discharge: 2016-05-29 | DRG: 208 | Disposition: A | Discharge status: Home Health | Payer: Medicare Other | Attending: Nephrology | Admitting: Nephrology

## 2016-05-24 ENCOUNTER — Encounter (HOSPITAL_COMMUNITY): Payer: Self-pay | Admitting: Emergency Medicine

## 2016-05-24 DIAGNOSIS — J441 Chronic obstructive pulmonary disease with (acute) exacerbation: Secondary | ICD-10-CM | POA: Diagnosis present

## 2016-05-24 DIAGNOSIS — E785 Hyperlipidemia, unspecified: Secondary | ICD-10-CM | POA: Diagnosis present

## 2016-05-24 DIAGNOSIS — I10 Essential (primary) hypertension: Secondary | ICD-10-CM | POA: Diagnosis present

## 2016-05-24 DIAGNOSIS — J9601 Acute respiratory failure with hypoxia: Secondary | ICD-10-CM

## 2016-05-24 DIAGNOSIS — R0600 Dyspnea, unspecified: Secondary | ICD-10-CM | POA: Diagnosis not present

## 2016-05-24 DIAGNOSIS — I959 Hypotension, unspecified: Secondary | ICD-10-CM | POA: Diagnosis not present

## 2016-05-24 DIAGNOSIS — J9621 Acute and chronic respiratory failure with hypoxia: Secondary | ICD-10-CM

## 2016-05-24 DIAGNOSIS — J9611 Chronic respiratory failure with hypoxia: Secondary | ICD-10-CM | POA: Diagnosis present

## 2016-05-24 DIAGNOSIS — Z79899 Other long term (current) drug therapy: Secondary | ICD-10-CM

## 2016-05-24 DIAGNOSIS — J9690 Respiratory failure, unspecified, unspecified whether with hypoxia or hypercapnia: Secondary | ICD-10-CM

## 2016-05-24 DIAGNOSIS — J440 Chronic obstructive pulmonary disease with acute lower respiratory infection: Secondary | ICD-10-CM | POA: Diagnosis present

## 2016-05-24 DIAGNOSIS — E876 Hypokalemia: Secondary | ICD-10-CM | POA: Diagnosis present

## 2016-05-24 DIAGNOSIS — R069 Unspecified abnormalities of breathing: Secondary | ICD-10-CM | POA: Diagnosis not present

## 2016-05-24 DIAGNOSIS — E871 Hypo-osmolality and hyponatremia: Secondary | ICD-10-CM | POA: Diagnosis present

## 2016-05-24 DIAGNOSIS — R51 Headache: Secondary | ICD-10-CM | POA: Diagnosis not present

## 2016-05-24 DIAGNOSIS — R000 Tachycardia, unspecified: Secondary | ICD-10-CM | POA: Diagnosis present

## 2016-05-24 DIAGNOSIS — E872 Acidosis: Secondary | ICD-10-CM | POA: Diagnosis not present

## 2016-05-24 DIAGNOSIS — Z8249 Family history of ischemic heart disease and other diseases of the circulatory system: Secondary | ICD-10-CM

## 2016-05-24 DIAGNOSIS — F17210 Nicotine dependence, cigarettes, uncomplicated: Secondary | ICD-10-CM | POA: Diagnosis present

## 2016-05-24 DIAGNOSIS — Z4682 Encounter for fitting and adjustment of non-vascular catheter: Secondary | ICD-10-CM | POA: Diagnosis not present

## 2016-05-24 DIAGNOSIS — F209 Schizophrenia, unspecified: Secondary | ICD-10-CM | POA: Diagnosis present

## 2016-05-24 DIAGNOSIS — J189 Pneumonia, unspecified organism: Principal | ICD-10-CM | POA: Diagnosis present

## 2016-05-24 DIAGNOSIS — E8770 Fluid overload, unspecified: Secondary | ICD-10-CM | POA: Diagnosis present

## 2016-05-24 DIAGNOSIS — Z833 Family history of diabetes mellitus: Secondary | ICD-10-CM

## 2016-05-24 DIAGNOSIS — R404 Transient alteration of awareness: Secondary | ICD-10-CM | POA: Diagnosis not present

## 2016-05-24 DIAGNOSIS — Z7951 Long term (current) use of inhaled steroids: Secondary | ICD-10-CM

## 2016-05-24 MED ORDER — ALBUTEROL SULFATE (2.5 MG/3ML) 0.083% IN NEBU
5.0000 mg | INHALATION_SOLUTION | Freq: Once | RESPIRATORY_TRACT | Status: AC
  Administered 2016-05-25: 5 mg via RESPIRATORY_TRACT
  Filled 2016-05-24: qty 6

## 2016-05-24 NOTE — ED Triage Notes (Signed)
Pt presents from home with resp diff and ams per mother. Was given 10 albuterol, 0.5 atrovent, and 125mg  solumedrol.

## 2016-05-24 NOTE — ED Provider Notes (Signed)
[facility] DEPT Provider Note   CSN: [id number] Arrival date & time: 05/24/16  2332   By signing my name below, I, Soijett Blue, attest that this documentation has been prepared under the direction and in the presence of Merryl Hacker, MD. Electronically Signed: [place], ED Scribe. 05/24/16. 11:51 PM.  History   Chief Complaint Chief Complaint  Patient presents with  . Altered Mental Status  . Respiratory Distress    LEVEL 5 CAVEAT: ALTERED MENTAL STATUS; SOMNOLENCE   HPI Blake Mcknight is a 61 y.o. male with a PMHx of HTN, COPD, who presents to the Emergency Department brought in by EMS complaining of AMS onset PTA. Family notes that when the pt woke up tonight he appeared "out of it." Per EMS, pt was given 10 mg albuterol, 0.5 mg atrovent, and 125 mg solumedrol with no relief of his symptoms. Family denies the pt having any other symptoms. Family states that the pt smokes cigarettes.  Sisters are at the bedside. Note that he appeared "out of it" and short of breath.   The history is provided by a relative. No language interpreter was used.    Past Medical History:  Diagnosis Date  . COPD (chronic obstructive pulmonary disease) (HCC)    with bullous emphysema.   Marland Kitchen Heavy cigarette smoker before 2003   pt claims only 10 cigs per day, never heavier amounts.   Marland Kitchen HLD (hyperlipidemia)   . Hypertension   . Schizophrenia ([place]) 06/28/2013   This is a chronic condition and he lives with family    Patient Active Problem List   Diagnosis Date Noted  . Altered mental status 09/22/2015  . Hyponatremia 09/22/2015  . Elevated lactic acid level 09/22/2015  . Acute encephalopathy   . Bullous emphysema ([place]) 07/26/2013  . Schizophrenia ([place]) 06/28/2013  . Tobacco use disorder 06/06/2013  . Abdominal pain 06/03/2013  . Sepsis ([place]) 06/03/2013  . Elevated LFTs 06/03/2013  . HTN (hypertension) 06/03/2013  . HLD (hyperlipidemia) 06/03/2013  . COPD (chronic obstructive  pulmonary disease) ([place]) 06/03/2013  . Calculus of bile duct without mention of cholecystitis or obstruction 06/03/2013    Past Surgical History:  Procedure Laterality Date  . CHOLECYSTECTOMY N/A 06/06/2013   Procedure: LAPAROSCOPIC CHOLECYSTECTOMY WITH ATTEMPTED INTRAOPERATIVE CHOLANGIOGRAM;  Surgeon: Zenovia Jarred, MD;  Location: [facility];  Service: General;  Laterality: N/A;  . ERCP N/A 06/03/2013   Procedure: ENDOSCOPIC RETROGRADE CHOLANGIOPANCREATOGRAPHY (ERCP);  Surgeon: Ladene Artist, MD;  Location: [facility] ENDOSCOPY;  Service: Endoscopy;  Laterality: N/A;  . NO PAST SURGERIES         Home Medications    Prior to Admission medications   Medication Sig Start Date End Date Taking? Authorizing Provider  ADVAIR HFA 115-21 MCG/ACT inhaler Inhale 1 puff into the lungs 2 (two) times daily. 09/21/15  Yes Historical Provider, MD  amLODipine (NORVASC) 10 MG tablet Take 10 mg by mouth daily.   Yes Historical Provider, MD  atorvastatin (LIPITOR) 10 MG tablet Take 10 mg by mouth daily. 09/21/15  Yes Historical Provider, MD  benztropine (COGENTIN) 2 MG tablet Take 2 mg by mouth 3 (three) times daily.  06/01/13  Yes Historical Provider, MD  HYDROcodone-acetaminophen (NORCO) 7.5-325 MG tablet Take 1 tablet by mouth 4 (four) times daily.   Yes Historical Provider, MD  mirtazapine (REMERON) 30 MG tablet Take 30 mg by mouth at bedtime. 09/18/15  Yes Historical Provider, MD  PROAIR HFA 108 (90 BASE) MCG/ACT inhaler Inhale 1 puff into the  lungs every 6 (six) hours as needed for wheezing or shortness of breath.  05/12/13  Yes Historical Provider, MD  risperidone (RISPERDAL) 4 MG tablet Take 4 mg by mouth 2 (two) times daily. 09/18/15  Yes Historical Provider, MD  tiotropium (SPIRIVA) 18 MCG inhalation capsule Place 1 capsule (18 mcg total) into inhaler and inhale daily. Patient not taking: Reported on 05/25/2016 06/08/13   Melton Alar, PA-C    Family History Family History  Problem Relation Age of Onset  .  Diabetes Mellitus II Mother   . Diabetes Mother   . Heart disease Father   . Stroke Maternal Aunt   . CAD Neg Hx     Social History Social History  Substance Use Topics  . Smoking status: Current Some Day Smoker    Packs/day: 1.00    Years: 10.00    Types: Cigarettes  . Smokeless tobacco: Former Systems developer    Types: Snuff  . Alcohol use No     Allergies   Patient has no known allergies.   Review of Systems Review of Systems  Unable to perform ROS: Mental status change     Physical Exam Updated Vital Signs BP 101/62   Pulse (!) 139   Temp 97.9 F (36.6 C) (Rectal)   Resp 20   SpO2 100%   Physical Exam  Constitutional:  Somnolent but arousable, will occasionally answer questions  HENT:  Head: Normocephalic and atraumatic.  Eyes: Pupils are equal, round, and reactive to light.  Cardiovascular: Normal rate, regular rhythm and normal heart sounds.   No murmur heard. Pulmonary/Chest: No respiratory distress. He has wheezes.  Coarse breath sounds, diffuse wheezing, fair air movement  Abdominal: Soft. Bowel sounds are normal. There is no tenderness. There is no rebound.  Musculoskeletal: He exhibits no edema.  Lymphadenopathy:    He has no cervical adenopathy.  Neurological:  Somnolent but arousable, follows commands  Skin: Skin is warm and dry.  Psychiatric: He has a normal mood and affect.  Nursing note and vitals reviewed.    ED Treatments / Results  DIAGNOSTIC STUDIES: Oxygen Saturation is 94% on RA, adequate by my interpretation.    COORDINATION OF CARE: 11:49 PM Discussed treatment plan with pt family at bedside which includes labs, EKG, CXR, breathing treatment, and pt family agreed to plan.  3:47 AM-Consult with intensivist, Dr. Juliann Pulse who recommends repeat lactate, fluid resuscitation, and admission to step-down.    Labs (all labs ordered are listed, but only abnormal results are displayed) Labs Reviewed  BASIC METABOLIC PANEL - Abnormal;  Notable for the following:       Result Value   Sodium 128 (*)    Potassium 3.2 (*)    Chloride 91 (*)    Glucose, Bld 174 (*)    All other components within normal limits  I-STAT ARTERIAL BLOOD GAS, ED - Abnormal; Notable for the following:    pO2, Arterial 63.0 (*)    All other components within normal limits  I-STAT CG4 LACTIC ACID, ED - Abnormal; Notable for the following:    Lactic Acid, Venous 5.02 (*)    All other components within normal limits  CULTURE, BLOOD (ROUTINE X 2)  CULTURE, BLOOD (ROUTINE X 2)  CBC WITH DIFFERENTIAL/PLATELET  URINALYSIS, ROUTINE W REFLEX MICROSCOPIC  I-STAT TROPOININ, ED  I-STAT CG4 LACTIC ACID, ED    EKG  EKG Interpretation  Date/Time:  Saturday May 24 2016 23:40:48 EST Ventricular Rate:  128 PR Interval:    QRS  Duration: 77 QT Interval:  291 QTC Calculation: 425 R Axis:   100 Text Interpretation:  Sinus tachycardia Atrial premature complex Right axis deviation Anteroseptal infarct, old Nonspecific repol abnormality, diffuse leads No significant change since last tracing Confirmed by Emory Leaver  MD, Licia Harl ([id number]) on 05/25/2016 1:01:08 AM       Radiology Dg Chest 2 View  Result Date: 05/25/2016 CLINICAL DATA:  Severe dyspnea for a few hours tonight. EXAM: CHEST  2 VIEW COMPARISON:  09/21/2015 FINDINGS: Severe emphysematous disease bilaterally with moderate hyperinflation. No pneumothorax. No effusion. No airspace consolidation. Heart size is normal. Hilar and mediastinal contours are unremarkable and unchanged. IMPRESSION: Severe emphysematous disease.  No consolidation or effusion. Electronically Signed   By: Andreas Newport M.D.   On: 05/25/2016 01:04    Procedures Procedures (including critical care time)  CRITICAL CARE Performed by: Merryl Hacker   Total critical care time: 60 minutes  Critical care time was exclusive of separately billable procedures and treating other patients.  Critical care was necessary to treat  or prevent imminent or life-threatening deterioration.  Critical care was time spent personally by me on the following activities: development of treatment plan with patient and/or surrogate as well as nursing, discussions with consultants, evaluation of patient's response to treatment, examination of patient, obtaining history from patient or surrogate, ordering and performing treatments and interventions, ordering and review of laboratory studies, ordering and review of radiographic studies, pulse oximetry and re-evaluation of patient's condition.   Medications Ordered in ED Medications  albuterol (PROVENTIL,VENTOLIN) solution continuous neb (15 mg/hr Nebulization New Bag/Given 05/25/16 0018)  LORazepam (ATIVAN) injection 0.5 mg (not administered)  etomidate (AMIDATE) injection (15 mg Intravenous Given 05/25/16 0538)  succinylcholine (ANECTINE) injection (150 mg Intravenous Given 05/25/16 0539)  albuterol (PROVENTIL) (2.5 MG/3ML) 0.083% nebulizer solution 5 mg (5 mg Nebulization Given 05/25/16 0006)  LORazepam (ATIVAN) injection 0.5 mg (0.5 mg Intravenous Given 05/25/16 0108)  sodium chloride 0.9 % bolus 1,000 mL (1,000 mLs Intravenous New Bag/Given 05/25/16 0145)  magnesium sulfate IVPB 1 g 100 mL (0 g Intravenous Stopped 05/25/16 0310)  sodium chloride 0.9 % bolus 1,000 mL (1,000 mLs Intravenous New Bag/Given 05/25/16 0344)    And  sodium chloride 0.9 % bolus 1,000 mL (1,000 mLs Intravenous New Bag/Given 05/25/16 0400)    And  sodium chloride 0.9 % bolus 250 mL (0 mLs Intravenous Stopped 05/25/16 0407)  cefTRIAXone (ROCEPHIN) 1 g in dextrose 5 % 50 mL IVPB (0 g Intravenous Stopped 05/25/16 0413)  azithromycin (ZITHROMAX) 500 mg in dextrose 5 % 250 mL IVPB (500 mg Intravenous New Bag/Given 05/25/16 0343)     Initial Impression / Assessment and Plan / ED Course  I have reviewed the triage vital signs and the nursing notes.  Pertinent labs & imaging results that were available during my care of the  patient were reviewed by me and considered in my medical decision making (see chart for details).  Clinical Course as of May 26 543  Sun May 25, 2016  0135 Continues to have waxing and waning mental status. On repeat check, continues to wheeze. Isolated blood pressure 90 systolic. We will add on rectal temperature and lactate to screen for sepsis. Rectal temperature 97.9.  [CH]  0320 Lactate 5.02. 30 mL/kg fluid ordered. Blood cultures ordered. Patient was given Rocephin and azithromycin. Feel that lactate could be related to work of breathing and respiratory status; however, sepsis is within the differential.  [CH]    Clinical Course User Index [  CH] Merryl Hacker, MD    Patient presents with respiratory distress. Intermittently somnolent. Wheezing on exam. Normal pH. Is hypoxic. Patient placed on BiPAP. No hypercarbia. On one recheck, he had soft blood pressure. I added a rectal temp and lactate to screen for sepsis. Lactate is 5.02. At least part of this is likely related to work of breathing but patient may also have early sepsis. Patient was volume resuscitated and given antibiotics for presumed pneumonia. He was given steroids en route. He received a continuous neb but continues to wheeze. Initially discussed with Dr. Ronnald Ramp, intensivist. Burnis Medin plan to admit to the stepdown unit with repeat lactate.  5:46 AM After discussing with the hospitalist and all repeat exam, patient is not tolerating BiPAP. He is pulling at the BiPAP is not easily directable. Given his sedation requirements and Respiratory status, patient was intubated for work of breathing. See additional note for details. He will now be admitted to the ICU.   Final Clinical Impressions(s) / ED Diagnoses   Final diagnoses:  Chronic obstructive pulmonary disease with acute exacerbation (HCC)  Acute on chronic respiratory failure with hypoxia (HCC)    New Prescriptions New Prescriptions   No medications on file   I  personally performed the services described in this documentation, which was scribed in my presence. The recorded information has been reviewed and is accurate.     Merryl Hacker, MD 05/25/16 [phone]

## 2016-05-25 ENCOUNTER — Inpatient Hospital Stay (HOSPITAL_COMMUNITY): Payer: Medicare Other

## 2016-05-25 ENCOUNTER — Encounter (HOSPITAL_COMMUNITY): Payer: Self-pay | Admitting: *Deleted

## 2016-05-25 ENCOUNTER — Emergency Department (HOSPITAL_COMMUNITY): Payer: Medicare Other

## 2016-05-25 DIAGNOSIS — J9621 Acute and chronic respiratory failure with hypoxia: Secondary | ICD-10-CM | POA: Diagnosis not present

## 2016-05-25 DIAGNOSIS — E872 Acidosis: Secondary | ICD-10-CM | POA: Diagnosis present

## 2016-05-25 DIAGNOSIS — J440 Chronic obstructive pulmonary disease with acute lower respiratory infection: Secondary | ICD-10-CM | POA: Diagnosis present

## 2016-05-25 DIAGNOSIS — F17210 Nicotine dependence, cigarettes, uncomplicated: Secondary | ICD-10-CM | POA: Diagnosis present

## 2016-05-25 DIAGNOSIS — J189 Pneumonia, unspecified organism: Secondary | ICD-10-CM | POA: Diagnosis present

## 2016-05-25 DIAGNOSIS — Z7951 Long term (current) use of inhaled steroids: Secondary | ICD-10-CM | POA: Diagnosis not present

## 2016-05-25 DIAGNOSIS — I959 Hypotension, unspecified: Secondary | ICD-10-CM | POA: Diagnosis present

## 2016-05-25 DIAGNOSIS — F209 Schizophrenia, unspecified: Secondary | ICD-10-CM | POA: Diagnosis present

## 2016-05-25 DIAGNOSIS — Z4682 Encounter for fitting and adjustment of non-vascular catheter: Secondary | ICD-10-CM | POA: Diagnosis not present

## 2016-05-25 DIAGNOSIS — E8770 Fluid overload, unspecified: Secondary | ICD-10-CM | POA: Diagnosis present

## 2016-05-25 DIAGNOSIS — Z79899 Other long term (current) drug therapy: Secondary | ICD-10-CM | POA: Diagnosis not present

## 2016-05-25 DIAGNOSIS — R51 Headache: Secondary | ICD-10-CM | POA: Diagnosis not present

## 2016-05-25 DIAGNOSIS — R0600 Dyspnea, unspecified: Secondary | ICD-10-CM | POA: Diagnosis not present

## 2016-05-25 DIAGNOSIS — E876 Hypokalemia: Secondary | ICD-10-CM | POA: Diagnosis present

## 2016-05-25 DIAGNOSIS — J9611 Chronic respiratory failure with hypoxia: Secondary | ICD-10-CM | POA: Diagnosis present

## 2016-05-25 DIAGNOSIS — R0603 Acute respiratory distress: Secondary | ICD-10-CM | POA: Diagnosis not present

## 2016-05-25 DIAGNOSIS — R000 Tachycardia, unspecified: Secondary | ICD-10-CM | POA: Diagnosis present

## 2016-05-25 DIAGNOSIS — E871 Hypo-osmolality and hyponatremia: Secondary | ICD-10-CM | POA: Diagnosis present

## 2016-05-25 DIAGNOSIS — R0602 Shortness of breath: Secondary | ICD-10-CM | POA: Diagnosis not present

## 2016-05-25 DIAGNOSIS — Z833 Family history of diabetes mellitus: Secondary | ICD-10-CM | POA: Diagnosis not present

## 2016-05-25 DIAGNOSIS — E785 Hyperlipidemia, unspecified: Secondary | ICD-10-CM | POA: Diagnosis present

## 2016-05-25 DIAGNOSIS — J9690 Respiratory failure, unspecified, unspecified whether with hypoxia or hypercapnia: Secondary | ICD-10-CM | POA: Diagnosis not present

## 2016-05-25 DIAGNOSIS — Z8249 Family history of ischemic heart disease and other diseases of the circulatory system: Secondary | ICD-10-CM | POA: Diagnosis not present

## 2016-05-25 DIAGNOSIS — J9601 Acute respiratory failure with hypoxia: Secondary | ICD-10-CM | POA: Diagnosis not present

## 2016-05-25 DIAGNOSIS — J441 Chronic obstructive pulmonary disease with (acute) exacerbation: Secondary | ICD-10-CM | POA: Diagnosis not present

## 2016-05-25 DIAGNOSIS — I10 Essential (primary) hypertension: Secondary | ICD-10-CM | POA: Diagnosis present

## 2016-05-25 LAB — POCT I-STAT 3, ART BLOOD GAS (G3+)
ACID-BASE DEFICIT: 11 mmol/L — AB (ref 0.0–2.0)
ACID-BASE DEFICIT: 11 mmol/L — AB (ref 0.0–2.0)
Acid-base deficit: 14 mmol/L — ABNORMAL HIGH (ref 0.0–2.0)
Bicarbonate: 13.2 mmol/L — ABNORMAL LOW (ref 20.0–28.0)
Bicarbonate: 15.7 mmol/L — ABNORMAL LOW (ref 20.0–28.0)
Bicarbonate: 15.2 mmol/L — ABNORMAL LOW (ref 20.0–28.0)
O2 SAT: 96 %
O2 SAT: 93 %
O2 SAT: 94 %
PCO2 ART: 34.9 mmHg (ref 32.0–48.0)
PH ART: 7.185 — AB (ref 7.350–7.450)
PH ART: 7.231 — AB (ref 7.350–7.450)
PH ART: 7.245 — AB (ref 7.350–7.450)
PO2 ART: 98 mmHg (ref 83.0–108.0)
Patient temperature: 98.6
Patient temperature: 98.5
TCO2: 14 mmol/L (ref 0–100)
TCO2: 17 mmol/L (ref 0–100)
TCO2: 16 mmol/L (ref 0–100)
pCO2 arterial: 37.5 mmHg (ref 32.0–48.0)
pCO2 arterial: 34.9 mmHg (ref 32.0–48.0)
pO2, Arterial: 79 mmHg — ABNORMAL LOW (ref 83.0–108.0)
pO2, Arterial: 81 mmHg — ABNORMAL LOW (ref 83.0–108.0)

## 2016-05-25 LAB — I-STAT ARTERIAL BLOOD GAS, ED
Acid-base deficit: 1 mmol/L (ref 0.0–2.0)
Acid-base deficit: 14 mmol/L — ABNORMAL HIGH (ref 0.0–2.0)
BICARBONATE: 17 mmol/L — AB (ref 20.0–28.0)
BICARBONATE: 23.7 mmol/L (ref 20.0–28.0)
O2 SAT: 91 %
O2 SAT: 96 %
PCO2 ART: 58.2 mmHg — AB (ref 32.0–48.0)
PO2 ART: 116 mmHg — AB (ref 83.0–108.0)
Patient temperature: 36.3
TCO2: 19 mmol/L (ref 0–100)
TCO2: 25 mmol/L (ref 0–100)
pCO2 arterial: 39.4 mmHg (ref 32.0–48.0)
pH, Arterial: 7.068 — CL (ref 7.350–7.450)
pH, Arterial: 7.387 (ref 7.350–7.450)
pO2, Arterial: 63 mmHg — ABNORMAL LOW (ref 83.0–108.0)

## 2016-05-25 LAB — URINALYSIS, ROUTINE W REFLEX MICROSCOPIC
BILIRUBIN URINE: NEGATIVE
GLUCOSE, UA: 150 mg/dL — AB
HGB URINE DIPSTICK: NEGATIVE
KETONES UR: NEGATIVE mg/dL
Leukocytes, UA: NEGATIVE
Nitrite: NEGATIVE
PROTEIN: NEGATIVE mg/dL
Specific Gravity, Urine: 1.009 (ref 1.005–1.030)
pH: 6 (ref 5.0–8.0)

## 2016-05-25 LAB — CBC
HEMATOCRIT: 38.7 % — AB (ref 39.0–52.0)
HEMOGLOBIN: 13 g/dL (ref 13.0–17.0)
MCH: 29 pg (ref 26.0–34.0)
MCHC: 33.6 g/dL (ref 30.0–36.0)
MCV: 86.4 fL (ref 78.0–100.0)
Platelets: 211 10*3/uL (ref 150–400)
RBC: 4.48 MIL/uL (ref 4.22–5.81)
RDW: 14.5 % (ref 11.5–15.5)
WBC: 12.4 10*3/uL — ABNORMAL HIGH (ref 4.0–10.5)

## 2016-05-25 LAB — BASIC METABOLIC PANEL
Anion gap: 13 (ref 5–15)
BUN: 8 mg/dL (ref 6–20)
CALCIUM: 9.1 mg/dL (ref 8.9–10.3)
CO2: 24 mmol/L (ref 22–32)
CREATININE: 0.76 mg/dL (ref 0.61–1.24)
Chloride: 91 mmol/L — ABNORMAL LOW (ref 101–111)
GFR calc Af Amer: >60 mL/min (ref >60)
GFR calc non Af Amer: >60 mL/min (ref >60)
GLUCOSE: 174 mg/dL — AB (ref 65–99)
Potassium: 3.2 mmol/L — ABNORMAL LOW (ref 3.5–5.1)
Sodium: 128 mmol/L — ABNORMAL LOW (ref 135–145)

## 2016-05-25 LAB — STREP PNEUMONIAE URINARY ANTIGEN: Strep Pneumo Urinary Antigen: NEGATIVE

## 2016-05-25 LAB — GLUCOSE, CAPILLARY
GLUCOSE-CAPILLARY: 139 mg/dL — AB (ref 65–99)
Glucose-Capillary: 170 mg/dL — ABNORMAL HIGH (ref 65–99)
Glucose-Capillary: 146 mg/dL — ABNORMAL HIGH (ref 65–99)

## 2016-05-25 LAB — I-STAT CG4 LACTIC ACID, ED
LACTIC ACID, VENOUS: 6.74 mmol/L — AB (ref 0.5–1.9)
Lactic Acid, Venous: 5.02 mmol/L (ref 0.5–1.9)

## 2016-05-25 LAB — CBC WITH DIFFERENTIAL/PLATELET
BASOS PCT: 0 %
Basophils Absolute: 0 10*3/uL (ref 0.0–0.1)
Eosinophils Absolute: 0.1 10*3/uL (ref 0.0–0.7)
Eosinophils Relative: 1 %
HEMATOCRIT: 44.5 % (ref 39.0–52.0)
Hemoglobin: 16 g/dL (ref 13.0–17.0)
LYMPHS PCT: 22 %
Lymphs Abs: 2.3 10*3/uL (ref 0.7–4.0)
MCH: 30.9 pg (ref 26.0–34.0)
MCHC: 36 g/dL (ref 30.0–36.0)
MCV: 86.1 fL (ref 78.0–100.0)
MONO ABS: 0.5 10*3/uL (ref 0.1–1.0)
Monocytes Relative: 5 %
NEUTROS ABS: 7.4 10*3/uL (ref 1.7–7.7)
Neutrophils Relative %: 72 %
Platelets: 242 10*3/uL (ref 150–400)
RBC: 5.17 MIL/uL (ref 4.22–5.81)
RDW: 14.3 % (ref 11.5–15.5)
WBC: 10.4 10*3/uL (ref 4.0–10.5)

## 2016-05-25 LAB — INFLUENZA PANEL BY PCR (TYPE A & B)
INFLAPCR: NEGATIVE
Influenza B By PCR: NEGATIVE

## 2016-05-25 LAB — PHOSPHORUS: Phosphorus: 3.3 mg/dL (ref 2.5–4.6)

## 2016-05-25 LAB — LACTIC ACID, PLASMA: LACTIC ACID, VENOUS: 5.2 mmol/L — AB (ref 0.5–1.9)

## 2016-05-25 LAB — RAPID URINE DRUG SCREEN, HOSP PERFORMED
Amphetamines: NOT DETECTED
Barbiturates: NOT DETECTED
Benzodiazepines: POSITIVE — AB
Cocaine: NOT DETECTED
OPIATES: POSITIVE — AB
Tetrahydrocannabinol: NOT DETECTED

## 2016-05-25 LAB — HIV ANTIBODY (ROUTINE TESTING W REFLEX): HIV Screen 4th Generation wRfx: NONREACTIVE

## 2016-05-25 LAB — MRSA PCR SCREENING: MRSA by PCR: NEGATIVE

## 2016-05-25 LAB — I-STAT TROPONIN, ED: Troponin i, poc: 0.01 ng/mL (ref 0.00–0.08)

## 2016-05-25 LAB — MAGNESIUM: MAGNESIUM: 2.4 mg/dL (ref 1.7–2.4)

## 2016-05-25 MED ORDER — SODIUM CHLORIDE 0.9 % IV BOLUS (SEPSIS)
250.0000 mL | Freq: Once | INTRAVENOUS | Status: AC
  Administered 2016-05-25: 250 mL via INTRAVENOUS

## 2016-05-25 MED ORDER — LORAZEPAM 2 MG/ML IJ SOLN
0.5000 mg | Freq: Once | INTRAMUSCULAR | Status: AC
  Administered 2016-05-25: 0.5 mg via INTRAVENOUS

## 2016-05-25 MED ORDER — ORAL CARE MOUTH RINSE
15.0000 mL | Freq: Four times a day (QID) | OROMUCOSAL | Status: DC
  Administered 2016-05-25 – 2016-05-26 (×6): 15 mL via OROMUCOSAL

## 2016-05-25 MED ORDER — SODIUM CHLORIDE 0.9 % IV BOLUS (SEPSIS)
1000.0000 mL | Freq: Once | INTRAVENOUS | Status: AC
  Administered 2016-05-25: 1000 mL via INTRAVENOUS

## 2016-05-25 MED ORDER — DEXTROSE 5 % IV SOLN
500.0000 mg | Freq: Once | INTRAVENOUS | Status: AC
  Administered 2016-05-25: 500 mg via INTRAVENOUS
  Filled 2016-05-25: qty 500

## 2016-05-25 MED ORDER — ALBUTEROL (5 MG/ML) CONTINUOUS INHALATION SOLN: 15.0000 mg/h | INHALATION_SOLUTION | Freq: Once | RESPIRATORY_TRACT | Status: DC

## 2016-05-25 MED ORDER — DOCUSATE SODIUM 50 MG/5ML PO LIQD: 100.0000 mg | Freq: Two times a day (BID) | ORAL | Status: DC | PRN

## 2016-05-25 MED ORDER — IPRATROPIUM-ALBUTEROL 0.5-2.5 (3) MG/3ML IN SOLN
RESPIRATORY_TRACT | Status: AC
  Filled 2016-05-25: qty 3

## 2016-05-25 MED ORDER — FENTANYL CITRATE (PF) 100 MCG/2ML IJ SOLN
100.0000 ug | INTRAMUSCULAR | Status: DC | PRN
  Administered 2016-05-25 (×3): 100 ug via INTRAVENOUS
  Filled 2016-05-25 (×3): qty 2

## 2016-05-25 MED ORDER — IOPAMIDOL (ISOVUE-370) INJECTION 76%
INTRAVENOUS | Status: AC
  Administered 2016-05-25: 100 mL
  Filled 2016-05-25: qty 100

## 2016-05-25 MED ORDER — DEXTROSE 5 % IV SOLN
1.0000 g | Freq: Once | INTRAVENOUS | Status: AC
  Administered 2016-05-25: 1 g via INTRAVENOUS
  Filled 2016-05-25: qty 10

## 2016-05-25 MED ORDER — PANTOPRAZOLE SODIUM 40 MG PO PACK
40.0000 mg | PACK | ORAL | Status: DC
  Administered 2016-05-25 – 2016-05-26 (×2): 40 mg
  Filled 2016-05-25 (×2): qty 20

## 2016-05-25 MED ORDER — POTASSIUM CHLORIDE 20 MEQ/15ML (10%) PO SOLN
40.0000 meq | Freq: Once | ORAL | Status: AC
  Administered 2016-05-25: 40 meq
  Filled 2016-05-25: qty 30

## 2016-05-25 MED ORDER — IPRATROPIUM-ALBUTEROL 0.5-2.5 (3) MG/3ML IN SOLN
3.0000 mL | RESPIRATORY_TRACT | Status: DC
  Administered 2016-05-25 (×2): 3 mL via RESPIRATORY_TRACT
  Filled 2016-05-25 (×2): qty 3

## 2016-05-25 MED ORDER — ENOXAPARIN SODIUM 40 MG/0.4ML ~~LOC~~ SOLN
40.0000 mg | SUBCUTANEOUS | Status: DC
  Administered 2016-05-25 – 2016-05-28 (×4): 40 mg via SUBCUTANEOUS
  Filled 2016-05-25 (×4): qty 0.4

## 2016-05-25 MED ORDER — IPRATROPIUM-ALBUTEROL 0.5-2.5 (3) MG/3ML IN SOLN
3.0000 mL | Freq: Four times a day (QID) | RESPIRATORY_TRACT | Status: DC
  Administered 2016-05-25 – 2016-05-27 (×10): 3 mL via RESPIRATORY_TRACT
  Filled 2016-05-25 (×9): qty 3

## 2016-05-25 MED ORDER — DEXMEDETOMIDINE HCL IN NACL 200 MCG/50ML IV SOLN
0.0000 ug/kg/h | INTRAVENOUS | Status: DC
  Administered 2016-05-25: 0.4 ug/kg/h via INTRAVENOUS
  Administered 2016-05-25 – 2016-05-26 (×6): 1 ug/kg/h via INTRAVENOUS
  Filled 2016-05-25: qty 150
  Filled 2016-05-25 (×4): qty 50

## 2016-05-25 MED ORDER — ALBUTEROL (5 MG/ML) CONTINUOUS INHALATION SOLN
15.0000 mg/h | INHALATION_SOLUTION | RESPIRATORY_TRACT | Status: DC
  Administered 2016-05-25: 15 mg/h via RESPIRATORY_TRACT

## 2016-05-25 MED ORDER — PROPOFOL 1000 MG/100ML IV EMUL
0.0000 ug/kg/min | INTRAVENOUS | Status: DC
  Administered 2016-05-25: 30 ug/kg/min via INTRAVENOUS
  Filled 2016-05-25: qty 100

## 2016-05-25 MED ORDER — IPRATROPIUM-ALBUTEROL 0.5-2.5 (3) MG/3ML IN SOLN
3.0000 mL | RESPIRATORY_TRACT | Status: DC | PRN
  Administered 2016-05-25: 3 mL via RESPIRATORY_TRACT
  Filled 2016-05-25: qty 3

## 2016-05-25 MED ORDER — SODIUM CHLORIDE 0.9 % IV SOLN
INTRAVENOUS | Status: DC
  Administered 2016-05-25 – 2016-05-27 (×3): via INTRAVENOUS

## 2016-05-25 MED ORDER — METHYLPREDNISOLONE SODIUM SUCC 40 MG IJ SOLR
40.0000 mg | Freq: Four times a day (QID) | INTRAMUSCULAR | Status: DC
  Administered 2016-05-25 – 2016-05-27 (×8): 40 mg via INTRAVENOUS
  Filled 2016-05-25 (×8): qty 1

## 2016-05-25 MED ORDER — LORAZEPAM 2 MG/ML IJ SOLN
INTRAMUSCULAR | Status: AC
  Filled 2016-05-25: qty 1

## 2016-05-25 MED ORDER — DEXTROSE 5 % IV SOLN
500.0000 mg | INTRAVENOUS | Status: DC
  Administered 2016-05-25 – 2016-05-27 (×3): 500 mg via INTRAVENOUS
  Filled 2016-05-25 (×3): qty 500

## 2016-05-25 MED ORDER — MIDAZOLAM HCL 2 MG/2ML IJ SOLN
2.0000 mg | INTRAMUSCULAR | Status: DC | PRN
  Administered 2016-05-25 – 2016-05-26 (×6): 2 mg via INTRAVENOUS
  Filled 2016-05-25 (×6): qty 2

## 2016-05-25 MED ORDER — MAGNESIUM SULFATE IN D5W 1-5 GM/100ML-% IV SOLN
1.0000 g | Freq: Once | INTRAVENOUS | Status: AC
  Administered 2016-05-25: 1 g via INTRAVENOUS
  Filled 2016-05-25: qty 100

## 2016-05-25 MED ORDER — PRO-STAT SUGAR FREE PO LIQD
30.0000 mL | Freq: Two times a day (BID) | ORAL | Status: DC
  Administered 2016-05-25 – 2016-05-26 (×3): 30 mL
  Filled 2016-05-25 (×3): qty 30

## 2016-05-25 MED ORDER — BISACODYL 10 MG RE SUPP
10.0000 mg | Freq: Every day | RECTAL | Status: DC | PRN
  Administered 2016-05-26: 10 mg via RECTAL
  Filled 2016-05-25: qty 1

## 2016-05-25 MED ORDER — PROPOFOL 1000 MG/100ML IV EMUL
INTRAVENOUS | Status: AC
  Filled 2016-05-25: qty 100

## 2016-05-25 MED ORDER — ETOMIDATE 2 MG/ML IV SOLN
INTRAVENOUS | Status: AC | PRN
  Administered 2016-05-25: 15 mg via INTRAVENOUS

## 2016-05-25 MED ORDER — SUCCINYLCHOLINE CHLORIDE 20 MG/ML IJ SOLN
INTRAMUSCULAR | Status: AC | PRN
  Administered 2016-05-25: 150 mg via INTRAVENOUS

## 2016-05-25 MED ORDER — ALBUTEROL (5 MG/ML) CONTINUOUS INHALATION SOLN
INHALATION_SOLUTION | RESPIRATORY_TRACT | Status: AC
  Administered 2016-05-25: 15 mg/h via RESPIRATORY_TRACT
  Filled 2016-05-25: qty 20

## 2016-05-25 MED ORDER — FENTANYL CITRATE (PF) 100 MCG/2ML IJ SOLN: 100.0000 ug | INTRAMUSCULAR | Status: DC | PRN

## 2016-05-25 MED ORDER — DEXTROSE 5 % IV SOLN
1.0000 g | INTRAVENOUS | Status: DC
  Administered 2016-05-25 – 2016-05-27 (×3): 1 g via INTRAVENOUS
  Filled 2016-05-25 (×4): qty 10

## 2016-05-25 MED ORDER — SODIUM CHLORIDE 0.9 % IV SOLN: 250.0000 mL | INTRAVENOUS | Status: DC | PRN

## 2016-05-25 MED ORDER — VITAL HIGH PROTEIN PO LIQD
1000.0000 mL | ORAL | Status: DC
  Administered 2016-05-25: 1000 mL
  Administered 2016-05-26 (×4)

## 2016-05-25 MED ORDER — METHYLPREDNISOLONE SODIUM SUCC 125 MG IJ SOLR: 60.0000 mg | Freq: Every day | INTRAMUSCULAR | Status: DC

## 2016-05-25 MED ORDER — DOCUSATE SODIUM 50 MG/5ML PO LIQD
100.0000 mg | Freq: Two times a day (BID) | ORAL | Status: DC | PRN
  Administered 2016-05-26 – 2016-05-27 (×2): 100 mg
  Filled 2016-05-25 (×2): qty 10

## 2016-05-25 MED ORDER — CHLORHEXIDINE GLUCONATE 0.12% ORAL RINSE (MEDLINE KIT)
15.0000 mL | Freq: Two times a day (BID) | OROMUCOSAL | Status: DC
  Administered 2016-05-25 – 2016-05-26 (×3): 15 mL via OROMUCOSAL

## 2016-05-25 MED ORDER — PROPOFOL 1000 MG/100ML IV EMUL
5.0000 ug/kg/min | Freq: Once | INTRAVENOUS | Status: DC
  Administered 2016-05-25: 15 ug/kg/min via INTRAVENOUS

## 2016-05-25 MED ORDER — LORAZEPAM 2 MG/ML IJ SOLN
INTRAMUSCULAR | Status: AC
  Administered 2016-05-25: 0.5 mg via INTRAVENOUS
  Filled 2016-05-25: qty 1

## 2016-05-25 NOTE — Progress Notes (Signed)
Critical ABG results given to Dr. Ancil Linsey  PH: 7.06 PCO2: 58.2 PaO2: 116 HCO3: 17.0 TCO2: 19 SO2: 96%

## 2016-05-25 NOTE — Progress Notes (Signed)
Patient taken off BiPAP and placed on high flow nasal cannula (10L) with verbal MD order Jennette Kettle).

## 2016-05-25 NOTE — Progress Notes (Signed)
Patient transported to CT and returned to 2M15. No complications. Vital signs stable throughout. Patient tolerated well. RT will continue to monitor.

## 2016-05-25 NOTE — ED Notes (Signed)
Report attempted 

## 2016-05-25 NOTE — Progress Notes (Signed)
PCCM Progress Note  Admission date: 05/24/2016 Referring provider: Abigail Butts, ER  CC: short of breath  HPI: 61 yo male smoker with dyspnea from AECOPD and PNA.  Hx of severe COPD with paraseptal emphysema.  Subjective: Sedated  Vital signs: BP (!) 92/54   Pulse (!) 110   Temp 98.5 F (36.9 C) (Oral)   Resp (!) 26   Wt 160 lb 0.9 oz (72.6 kg)   SpO2 98%   BMI 24.34 kg/m   Intake/output: I/O last 3 completed shifts: In: 3650 [IV Piggyback:3650] Out: -   General: sedated Neuro: RASS -3 HEENT: ETT in place Cardiac: regular Chest: prolonged exhalation, no wheeze Abd: soft, non tender Ext: no edema Skin: no rashes   CMP Latest Ref Rng & Units 05/25/2016 09/23/2015 09/21/2015  Glucose 65 - 99 mg/dL 174(H) 93 112(H)  BUN 6 - 20 mg/dL 8 5(L) 4(L)  Creatinine 0.61 - 1.24 mg/dL 0.76 0.67 0.70  Sodium 135 - 145 mmol/L 128(L) 134(L) 136  Potassium 3.5 - 5.1 mmol/L 3.2(L) 3.7 3.6  Chloride 101 - 111 mmol/L 91(L) 103 96(L)  CO2 22 - 32 mmol/L 24 25 -  Calcium 8.9 - 10.3 mg/dL 9.1 8.5(L) -  Total Protein 6.5 - 8.1 g/dL - - -  Total Bilirubin 0.3 - 1.2 mg/dL - - -  Alkaline Phos 38 - 126 U/L - - -  AST 15 - 41 U/L - - -  ALT 17 - 63 U/L - - -     CBC Latest Ref Rng & Units 05/25/2016 09/23/2015 09/21/2015  WBC 4.0 - 10.5 K/uL 10.4 8.5 -  Hemoglobin 13.0 - 17.0 g/dL 16.0 12.4(L) 16.3  Hematocrit 39.0 - 52.0 % 44.5 37.1(L) 48.0  Platelets 150 - 400 K/uL 242 224 -     ABG    Component Value Date/Time   PHART 7.185 (LL) 05/25/2016 1102   PCO2ART 34.9 05/25/2016 1102   PO2ART 98.0 05/25/2016 1102   HCO3 13.2 (L) 05/25/2016 1102   TCO2 14 05/25/2016 1102   ACIDBASEDEF 14.0 (H) 05/25/2016 1102   O2SAT 96.0 05/25/2016 1102     CBG (last 3)   Recent Labs  05/25/16 0915  GLUCAP 139*     Imaging: Dg Chest 2 View  Result Date: 05/25/2016 CLINICAL DATA:  Severe dyspnea for a few hours tonight. EXAM: CHEST  2 VIEW COMPARISON:  09/21/2015 FINDINGS: Severe  emphysematous disease bilaterally with moderate hyperinflation. No pneumothorax. No effusion. No airspace consolidation. Heart size is normal. Hilar and mediastinal contours are unremarkable and unchanged. IMPRESSION: Severe emphysematous disease.  No consolidation or effusion. Electronically Signed   By: Andreas Newport M.D.   On: 05/25/2016 01:04   Ct Angio Chest Pe W Or Wo Contrast  Result Date: 05/25/2016 CLINICAL DATA:  Respiratory distress, hypoxemia EXAM: CT ANGIOGRAPHY CHEST WITH CONTRAST TECHNIQUE: Multidetector CT imaging of the chest was performed using the standard protocol during bolus administration of intravenous contrast. Multiplanar CT image reconstructions and MIPs were obtained to evaluate the vascular anatomy. CONTRAST:  100 mL Isovue-300 COMPARISON:  None. FINDINGS: Cardiovascular: Satisfactory opacification of the pulmonary arteries to the segmental level. No evidence of pulmonary embolism. Normal heart size. No pericardial effusion. Normal caliber thoracic aorta. Thoracic aortic dissection. Mediastinum/Nodes: No enlarged mediastinal, hilar, or axillary lymph nodes. Thyroid gland, trachea, and esophagus demonstrate no significant findings. Lungs/Pleura: Severe bilateral emphysematous changes. Right lower lobe airspace disease concerning for atelectasis versus pneumonia. No pleural effusion or pneumothorax. Upper Abdomen: No acute abnormality. Musculoskeletal: No  chest wall abnormality. No acute or significant osseous findings. Review of the MIP images confirms the above findings. IMPRESSION: 1. No evidence pulmonary embolus. 2. Right lower lobe airspace disease which may reflect atelectasis versus pneumonia. Electronically Signed   By: Kathreen Devoid   On: 05/25/2016 12:58   Dg Chest Portable 1 View  Result Date: 05/25/2016 CLINICAL DATA:  Initial evaluation for intubation. EXAM: PORTABLE CHEST 1 VIEW COMPARISON:  Prior radiograph from earlier the same day. FINDINGS: Patient is  intubated with the tip of the endotracheal tube positioned approximately 7.3 cm above the carina. Tip is at the inferior margin of the clavicles. Enteric tube courses into the abdomen. Cardiac and mediastinal silhouettes are stable. Extensive severe emphysematous changes again noted, stable. No other new active cardiopulmonary disease. No pulmonary edema or pleural effusion. No pneumothorax. Osseous structures unchanged. IMPRESSION: 1. Tip of the endotracheal tube at the inferior margin of the clavicles, 7.3 cm above the carina. 2. Severe emphysema, stable. No other new active cardiopulmonary disease. Electronically Signed   By: Jeannine Boga M.D.   On: 05/25/2016 07:43   Dg Chest Portable 1 View  Result Date: 05/25/2016 CLINICAL DATA:  Intubation EXAM: PORTABLE CHEST 1 VIEW COMPARISON:  05/24/2016 FINDINGS: The endotracheal tube is 5.7 cm above the carina. The nasogastric tube extends well into the stomach. Diffuse emphysematous changes are again evident. No pneumothorax. No confluent consolidation. No large effusion. IMPRESSION: ET and NG tubes appear satisfactorily positioned. Diffuse emphysematous changes are again evident. Electronically Signed   By: Andreas Newport M.D.   On: 05/25/2016 06:26     Studies:  Antibiotics: Rocephin 3/11 >> Zithromax 3/11 >>  Cultures: Blood 3/11 >> Sputum 3/11 >> Influenza 3/11 >> negative Pneumococcal Ag 3/11 >> Legionella Ag 3/11 >>  Lines/tubes: ETT 3/11 >>  Events: 3/11 Admit, acidosis >> d/c diprivan  Assessment/plan:  Acute hypoxic respiratory failure 2nd to CAP with AECOPD. Tobacco abuse. - change to pressure control  - f/u CXR, ABG - day 1 of Abx - solumedrol - schedule duoneb  Lactic acidosis. - hemodynamics okay - d/c diprivan, adjust vent settings - f/u ABG, lactic acid  Hyponatremia. Hypokalemia. - NS IV fluid - replace electrolytes as needed  Sedation. - change to precedex - RASS goal 0 to -1  DVT prophylaxis  - lovenox SUP - protonix Nutrition - tube feeds Goals of care - full code  CC time 42 minutes  Chesley Mires, MD [place] 05/25/2016, 1:28 PM Pager:  [phone] After 3pm call: [phone]

## 2016-05-25 NOTE — H&P (Signed)
PULMONARY / CRITICAL CARE MEDICINE   Name: Blake Mcknight MRN: [id number] DOB: Jan 21, 1956    ADMISSION DATE:  05/24/2016  CHIEF COMPLAINT:  Dyspnea  HISTORY OF PRESENT ILLNESS:   Blake Mcknight is a 61 y/o man with a history of longstanding tobacco abuse and severe COPD (FEV1 the 30s in 2015) who presents with worsening respiratory failure. He was placed on BiPAP, but due to agitation, was eventually intubated. His wife states that his symptoms can on somewhat abruptly a few hours prior to his admission, and were associated with some confusion.  PAST MEDICAL HISTORY :  He  has a past medical history of COPD (chronic obstructive pulmonary disease) ([name]); Heavy cigarette smoker (before 2003); HLD (hyperlipidemia); Hypertension; and Schizophrenia ([name]) (06/28/2013).  PAST SURGICAL HISTORY: He  has a past surgical history that includes No past surgeries; ERCP (N/A, 06/03/2013); and Cholecystectomy (N/A, 06/06/2013).  No Known Allergies  No current facility-administered medications on file prior to encounter.    Current Outpatient Prescriptions on File Prior to Encounter  Medication Sig  . ADVAIR HFA 115-21 MCG/ACT inhaler Inhale 1 puff into the lungs 2 (two) times daily.  Marland Kitchen atorvastatin (LIPITOR) 10 MG tablet Take 10 mg by mouth daily.  . benztropine (COGENTIN) 2 MG tablet Take 2 mg by mouth 3 (three) times daily.   . mirtazapine (REMERON) 30 MG tablet Take 30 mg by mouth at bedtime.  Marland Kitchen PROAIR HFA 108 (90 BASE) MCG/ACT inhaler Inhale 1 puff into the lungs every 6 (six) hours as needed for wheezing or shortness of breath.   . risperidone (RISPERDAL) 4 MG tablet Take 4 mg by mouth 2 (two) times daily.  Marland Kitchen tiotropium (SPIRIVA) 18 MCG inhalation capsule Place 1 capsule (18 mcg total) into inhaler and inhale daily. (Patient not taking: Reported on 05/25/2016)    FAMILY HISTORY:  His indicated that his mother is deceased. He indicated that his father is alive. He indicated that the status of  his maternal aunt is unknown. He indicated that the status of his neg hx is unknown.    SOCIAL HISTORY: He  reports that he has been smoking Cigarettes.  He has a 10.00 pack-year smoking history. He has quit using smokeless tobacco. His smokeless tobacco use included Snuff. He reports that he does not drink alcohol or use drugs.  REVIEW OF SYSTEMS:   Per HPI  VITAL SIGNS: BP 107/56   Pulse (!) 123   Temp 97.5 F (36.4 C)   Resp 20   Wt 160 lb 0.9 oz (72.6 kg)   SpO2 97%   BMI 24.34 kg/m   HEMODYNAMICS:    VENTILATOR SETTINGS: Vent Mode: PRVC FiO2 (%):  [40 %-50 %] 40 % Set Rate:  [18 bmp-25 bmp] 25 bmp Vt Set:  [520 mL] 520 mL PEEP:  [5 cmH20-8 cmH20] 8 cmH20 Plateau Pressure:  [17 cmH20] 17 cmH20  INTAKE / OUTPUT: No intake/output data recorded.  PHYSICAL EXAMINATION: General:  Middle aged man, intubated, sedated Neuro:  Unresponsive due to sedation HEENT:  MMM Cardiovascular:  Heart sounds distant Lungs:  Distant, but prolonged expiratory phase and ++ wheezing. Abdomen:  Soft Musculoskeletal:  No deformities Skin:  No rashes.  LABS:  BMET  Recent Labs Lab 05/25/16 0015  NA 128*  K 3.2*  CL 91*  CO2 24  BUN 8  CREATININE 0.76  GLUCOSE 174*    Electrolytes  Recent Labs Lab 05/25/16 0015  CALCIUM 9.1    CBC  Recent Labs Lab 05/25/16 0015  WBC 10.4  HGB 16.0  HCT 44.5  PLT 242    Coag's No results for input(s): APTT, INR in the last 168 hours.  Sepsis Markers  Recent Labs Lab 05/25/16 0308  LATICACIDVEN 5.02*    ABG  Recent Labs Lab 05/25/16 0011 05/25/16 0606  PHART 7.387 7.068*  PCO2ART 39.4 58.2*  PO2ART 63.0* 116.0*    Liver Enzymes No results for input(s): AST, ALT, ALKPHOS, BILITOT, ALBUMIN in the last 168 hours.  Cardiac Enzymes No results for input(s): TROPONINI, PROBNP in the last 168 hours.  Glucose No results for input(s): GLUCAP in the last 168 hours.  Imaging Dg Chest 2 View  Result Date:  05/25/2016 CLINICAL DATA:  Severe dyspnea for a few hours tonight. EXAM: CHEST  2 VIEW COMPARISON:  09/21/2015 FINDINGS: Severe emphysematous disease bilaterally with moderate hyperinflation. No pneumothorax. No effusion. No airspace consolidation. Heart size is normal. Hilar and mediastinal contours are unremarkable and unchanged. IMPRESSION: Severe emphysematous disease.  No consolidation or effusion. Electronically Signed   By: Andreas Newport M.D.   On: 05/25/2016 01:04   Dg Chest Portable 1 View  Result Date: 05/25/2016 CLINICAL DATA:  Intubation EXAM: PORTABLE CHEST 1 VIEW COMPARISON:  05/24/2016 FINDINGS: The endotracheal tube is 5.7 cm above the carina. The nasogastric tube extends well into the stomach. Diffuse emphysematous changes are again evident. No pneumothorax. No confluent consolidation. No large effusion. IMPRESSION: ET and NG tubes appear satisfactorily positioned. Diffuse emphysematous changes are again evident. Electronically Signed   By: Andreas Newport M.D.   On: 05/25/2016 06:26     STUDIES:  CTA-pulm (pending) >>  CULTURES: None  ANTIBIOTICS: None  SIGNIFICANT EVENTS: Intubated in the ED  LINES/TUBES: PIV ETT OGT  DISCUSSION: 61 y/o man with severe COPD presenting with abrupt onset respiratory failure.  ASSESSMENT / PLAN:  PULMONARY A: Respiratory failure Severe COPD Exacerbation Need for mechanical ventilation P:   Normal alpha-1 antitrypsin level/phenotype Solumedrol DuoNebs  CARDIOVASCULAR A:  Mild hypotension Possible PE P:  F/u CTA Hold home meds while on vent  RENAL A:   Worsening lactic acidosis P:   Possible hemodynamic compromise from PE vs ?sepsis  GASTROINTESTINAL A:   No active issues P:    HEMATOLOGIC A:   No active issues P:   INFECTIOUS A:   No active issues P:    ENDOCRINE A:   No active issues   P:    NEUROLOGIC A:   Need for sedation due to mechanical ventilation  P:   Consider propofol, but  will likely cause worsening hypotension/shock. RASS goal: 0   FAMILY  - Updates: Updated on admission  - Inter-disciplinary family meet or Palliative Care meeting due by:  05/31/16  CRITICAL CARE Performed by: Luz Brazen   Total critical care time: 45 minutes  Critical care time was exclusive of separately billable procedures and treating other patients.  Critical care was necessary to treat or prevent imminent or life-threatening deterioration.  Critical care was time spent personally by me on the following activities: development of treatment plan with patient and/or surrogate as well as nursing, discussions with consultants, evaluation of patient's response to treatment, examination of patient, obtaining history from patient or surrogate, ordering and performing treatments and interventions, ordering and review of laboratory studies, ordering and review of radiographic studies, pulse oximetry and re-evaluation of patient's condition.  Luz Brazen, MD Pulmonary [facility] [place] Pager: [phone]  05/25/2016, 6:59 AM

## 2016-05-25 NOTE — ED Notes (Signed)
Dr Dina Rich given a copy of lactic acid results 5.02

## 2016-05-25 NOTE — ED Notes (Signed)
Prop at 20

## 2016-05-25 NOTE — ED Provider Notes (Signed)
Procedure Name: Intubation Date/Time: 05/25/2016 5:51 AM Performed by: Abigail Butts Pre-anesthesia Checklist: Patient identified, Emergency Drugs available, Suction available, Patient being monitored and Timeout performed Oxygen Delivery Method: Nasal cannula Preoxygenation: Pre-oxygenation with 100% oxygen Intubation Type: Rapid sequence Ventilation: Mask ventilation without difficulty Laryngoscope Size: Mac and 3 Grade View: Grade I Tube size: 7.5 mm Number of attempts: 1 Airway Equipment and Method: Rigid stylet and Video-laryngoscopy Placement Confirmation: ETT inserted through vocal cords under direct vision,  Positive ETCO2,  CO2 detector and Breath sounds checked- equal and bilateral Secured at: 21 cm Tube secured with: ETT holder Dental Injury: Teeth and Oropharynx as per pre-operative assessment          Abigail Butts, PA-C 05/25/16 [place], MD 05/26/16 0630

## 2016-05-25 NOTE — Progress Notes (Signed)
   05/25/16 0115  BiPAP/CPAP/SIPAP  BiPAP/CPAP/SIPAP Pt Type Adult  Mask Type Full face mask  Set Rate 12 breaths/min  Respiratory Rate 24 breaths/min  IPAP 10 cmH20  EPAP 5 cmH2O  Oxygen Percent 50 %  Flow Rate 0 lpm  Minute Ventilation 18.4  Leak 62  Peak Inspiratory Pressure (PIP) 14  Tidal Volume (Vt) 758  BiPAP/CPAP/SIPAP BiPAP  Patient Home Equipment No  Auto Titrate No  BiPAP/CPAP /SiPAP Vitals  Pulse Rate (!) 133  Resp 24  SpO2 100 %  Patient placed on NIV Bipap on above settings.

## 2016-05-25 NOTE — Progress Notes (Signed)
Called for report ED RN currently in emergency procedure will call once complete

## 2016-05-26 ENCOUNTER — Inpatient Hospital Stay (HOSPITAL_COMMUNITY): Payer: Medicare Other

## 2016-05-26 DIAGNOSIS — J9621 Acute and chronic respiratory failure with hypoxia: Secondary | ICD-10-CM

## 2016-05-26 LAB — POCT I-STAT 3, ART BLOOD GAS (G3+)
Acid-Base Excess: 2 mmol/L (ref 0.0–2.0)
Bicarbonate: 25.8 mmol/L (ref 20.0–28.0)
O2 Saturation: 97 %
PCO2 ART: 36.7 mmHg (ref 32.0–48.0)
PH ART: 7.455 — AB (ref 7.350–7.450)
PO2 ART: 87 mmHg (ref 83.0–108.0)
TCO2: 27 mmol/L (ref 0–100)

## 2016-05-26 LAB — GLUCOSE, CAPILLARY
GLUCOSE-CAPILLARY: 110 mg/dL — AB (ref 65–99)
GLUCOSE-CAPILLARY: 162 mg/dL — AB (ref 65–99)
GLUCOSE-CAPILLARY: 182 mg/dL — AB (ref 65–99)
GLUCOSE-CAPILLARY: 98 mg/dL (ref 65–99)
Glucose-Capillary: 162 mg/dL — ABNORMAL HIGH (ref 65–99)
Glucose-Capillary: 115 mg/dL — ABNORMAL HIGH (ref 65–99)

## 2016-05-26 LAB — BLOOD GAS, ARTERIAL
Acid-base deficit: 1.8 mmol/L (ref 0.0–2.0)
BICARBONATE: 22.8 mmol/L (ref 20.0–28.0)
Drawn by: 23604
FIO2: 35
LHR: 14 {breaths}/min
O2 Saturation: 96.3 %
PEEP/CPAP: 5 cmH2O
Patient temperature: 97
Pressure control: 20 cmH2O
pCO2 arterial: 39.9 mmHg (ref 32.0–48.0)
pH, Arterial: 7.371 (ref 7.350–7.450)
pO2, Arterial: 86.7 mmHg (ref 83.0–108.0)

## 2016-05-26 LAB — LACTIC ACID, PLASMA: Lactic Acid, Venous: 3.3 mmol/L (ref 0.5–1.9)

## 2016-05-26 LAB — CBC
HEMATOCRIT: 41.4 % (ref 39.0–52.0)
HEMOGLOBIN: 14 g/dL (ref 13.0–17.0)
MCH: 29.2 pg (ref 26.0–34.0)
MCHC: 33.8 g/dL (ref 30.0–36.0)
MCV: 86.4 fL (ref 78.0–100.0)
Platelets: 217 10*3/uL (ref 150–400)
RBC: 4.79 MIL/uL (ref 4.22–5.81)
RDW: 14.7 % (ref 11.5–15.5)
WBC: 14.7 10*3/uL — ABNORMAL HIGH (ref 4.0–10.5)

## 2016-05-26 LAB — BASIC METABOLIC PANEL
ANION GAP: 8 (ref 5–15)
BUN: 11 mg/dL (ref 6–20)
CO2: 23 mmol/L (ref 22–32)
Calcium: 8.2 mg/dL — ABNORMAL LOW (ref 8.9–10.3)
Chloride: 100 mmol/L — ABNORMAL LOW (ref 101–111)
Creatinine, Ser: 0.65 mg/dL (ref 0.61–1.24)
GFR calc Af Amer: >60 mL/min (ref >60)
GFR calc non Af Amer: >60 mL/min (ref >60)
GLUCOSE: 160 mg/dL — AB (ref 65–99)
POTASSIUM: 4.9 mmol/L (ref 3.5–5.1)
Sodium: 131 mmol/L — ABNORMAL LOW (ref 135–145)

## 2016-05-26 LAB — PHOSPHORUS: Phosphorus: 2.9 mg/dL (ref 2.5–4.6)

## 2016-05-26 LAB — LEGIONELLA PNEUMOPHILA SEROGP 1 UR AG: L. PNEUMOPHILA SEROGP 1 UR AG: NEGATIVE

## 2016-05-26 LAB — MAGNESIUM: Magnesium: 2.5 mg/dL — ABNORMAL HIGH (ref 1.7–2.4)

## 2016-05-26 MED ORDER — ACETAMINOPHEN 325 MG PO TABS
650.0000 mg | ORAL_TABLET | Freq: Four times a day (QID) | ORAL | Status: DC | PRN
  Administered 2016-05-26: 650 mg via ORAL
  Filled 2016-05-26: qty 2

## 2016-05-26 MED ORDER — ORAL CARE MOUTH RINSE
15.0000 mL | Freq: Two times a day (BID) | OROMUCOSAL | Status: DC
  Administered 2016-05-26 – 2016-05-29 (×6): 15 mL via OROMUCOSAL

## 2016-05-26 MED ORDER — VITAL AF 1.2 CAL PO LIQD
1000.0000 mL | ORAL | Status: DC
  Administered 2016-05-26: 1000 mL

## 2016-05-26 MED ORDER — FUROSEMIDE 10 MG/ML IJ SOLN
20.0000 mg | Freq: Once | INTRAMUSCULAR | Status: AC
  Administered 2016-05-26: 20 mg via INTRAVENOUS
  Filled 2016-05-26: qty 2

## 2016-05-26 MED ORDER — ONDANSETRON HCL 4 MG/2ML IJ SOLN: 4.0000 mg | Freq: Four times a day (QID) | INTRAMUSCULAR | Status: DC | PRN

## 2016-05-26 NOTE — Progress Notes (Signed)
PCCM Progress Note  Admission date: 05/24/2016 Referring provider: Abigail Butts, ER  CC: short of breath  HPI: 61 yo male smoker with dyspnea from AECOPD and PNA.  Hx of severe COPD with paraseptal emphysema.  Subjective: Alert and following commands, sedation off, weaning since 8 am with good volumes, rate and saturations. Nods appropriately to questions.   Vital signs: BP 109/64   Pulse 82   Temp 98.2 F (36.8 C) (Oral)   Resp 12   Ht 5\' 8"  (1.727 m)   Wt 158 lb 1.1 oz (71.7 kg)   SpO2 98%   BMI 24.03 kg/m   Intake/output: I/O last 3 completed shifts: In: 5846.4 [I.V.:1176.4; NG/GT:720; IV Piggyback:3950] Out: 1280 [Urine:1280]  General: Alert and appropriate, weaning Neuro: Alert and oriented to person and place, reoriented to time. HEENT: ETT in place, atraumatic normocephalic Cardiac: RRR, no rubs murmurs or gallops noted Chest: Long expiratory phase, clear breath sounds  Abd: distended , non tender, BS quiet Ext: No edema noted, warm with brisk refill Skin: intact without rash noted   CMP Latest Ref Rng & Units 05/26/2016 05/25/2016 09/23/2015  Glucose 65 - 99 mg/dL 160(H) 174(H) 93  BUN 6 - 20 mg/dL 11 8 5(L)  Creatinine 0.61 - 1.24 mg/dL 0.65 0.76 0.67  Sodium 135 - 145 mmol/L 131(L) 128(L) 134(L)  Potassium 3.5 - 5.1 mmol/L 4.9 3.2(L) 3.7  Chloride 101 - 111 mmol/L 100(L) 91(L) 103  CO2 22 - 32 mmol/L 23 24 25   Calcium 8.9 - 10.3 mg/dL 8.2(L) 9.1 8.5(L)  Total Protein 6.5 - 8.1 g/dL - - -  Total Bilirubin 0.3 - 1.2 mg/dL - - -  Alkaline Phos 38 - 126 U/L - - -  AST 15 - 41 U/L - - -  ALT 17 - 63 U/L - - -     CBC Latest Ref Rng & Units 05/26/2016 05/25/2016 05/25/2016  WBC 4.0 - 10.5 K/uL 14.7(H) 12.4(H) 10.4  Hemoglobin 13.0 - 17.0 g/dL 14.0 13.0 16.0  Hematocrit 39.0 - 52.0 % 41.4 38.7(L) 44.5  Platelets 150 - 400 K/uL 217 211 242     ABG    Component Value Date/Time   PHART 7.371 05/26/2016 0325   PCO2ART 39.9 05/26/2016 0325   PO2ART  86.7 05/26/2016 0325   HCO3 22.8 05/26/2016 0325   TCO2 16 05/25/2016 1559   ACIDBASEDEF 1.8 05/26/2016 0325   O2SAT 96.3 05/26/2016 0325     CBG (last 3)   Recent Labs  05/26/16 0029 05/26/16 0341 05/26/16 0840  GLUCAP 162* 162* 182*     Imaging: Dg Chest 2 View  Result Date: 05/25/2016 CLINICAL DATA:  Severe dyspnea for a few hours tonight. EXAM: CHEST  2 VIEW COMPARISON:  09/21/2015 FINDINGS: Severe emphysematous disease bilaterally with moderate hyperinflation. No pneumothorax. No effusion. No airspace consolidation. Heart size is normal. Hilar and mediastinal contours are unremarkable and unchanged. IMPRESSION: Severe emphysematous disease.  No consolidation or effusion. Electronically Signed   By: Andreas Newport M.D.   On: 05/25/2016 01:04   Ct Angio Chest Pe W Or Wo Contrast  Result Date: 05/25/2016 CLINICAL DATA:  Respiratory distress, hypoxemia EXAM: CT ANGIOGRAPHY CHEST WITH CONTRAST TECHNIQUE: Multidetector CT imaging of the chest was performed using the standard protocol during bolus administration of intravenous contrast. Multiplanar CT image reconstructions and MIPs were obtained to evaluate the vascular anatomy. CONTRAST:  100 mL Isovue-300 COMPARISON:  None. FINDINGS: Cardiovascular: Satisfactory opacification of the pulmonary arteries to the segmental level. No evidence  of pulmonary embolism. Normal heart size. No pericardial effusion. Normal caliber thoracic aorta. Thoracic aortic dissection. Mediastinum/Nodes: No enlarged mediastinal, hilar, or axillary lymph nodes. Thyroid gland, trachea, and esophagus demonstrate no significant findings. Lungs/Pleura: Severe bilateral emphysematous changes. Right lower lobe airspace disease concerning for atelectasis versus pneumonia. No pleural effusion or pneumothorax. Upper Abdomen: No acute abnormality. Musculoskeletal: No chest wall abnormality. No acute or significant osseous findings. Review of the MIP images confirms the  above findings. IMPRESSION: 1. No evidence pulmonary embolus. 2. Right lower lobe airspace disease which may reflect atelectasis versus pneumonia. Electronically Signed   By: Kathreen Devoid   On: 05/25/2016 12:58   Dg Chest Port 1 View  Result Date: 05/26/2016 CLINICAL DATA:  Respiratory failure.  Shortness of breath. EXAM: PORTABLE CHEST 1 VIEW COMPARISON:  01/2017. FINDINGS: Endotracheal tube in satisfactory position. Nasogastric tube extending into the stomach. Extensive bilateral bullous changes. Progressive right lower lobe airspace opacity. No acute bony abnormality. Normal sized heart. IMPRESSION: 1. Progressive right lower lobe pneumonia. 2. Stable marked changes of COPD with extensive bullous emphysema. Electronically Signed   By: Claudie Revering M.D.   On: 05/26/2016 07:21   Dg Chest Portable 1 View  Result Date: 05/25/2016 CLINICAL DATA:  Initial evaluation for intubation. EXAM: PORTABLE CHEST 1 VIEW COMPARISON:  Prior radiograph from earlier the same day. FINDINGS: Patient is intubated with the tip of the endotracheal tube positioned approximately 7.3 cm above the carina. Tip is at the inferior margin of the clavicles. Enteric tube courses into the abdomen. Cardiac and mediastinal silhouettes are stable. Extensive severe emphysematous changes again noted, stable. No other new active cardiopulmonary disease. No pulmonary edema or pleural effusion. No pneumothorax. Osseous structures unchanged. IMPRESSION: 1. Tip of the endotracheal tube at the inferior margin of the clavicles, 7.3 cm above the carina. 2. Severe emphysema, stable. No other new active cardiopulmonary disease. Electronically Signed   By: Jeannine Boga M.D.   On: 05/25/2016 07:43   Dg Chest Portable 1 View  Result Date: 05/25/2016 CLINICAL DATA:  Intubation EXAM: PORTABLE CHEST 1 VIEW COMPARISON:  05/24/2016 FINDINGS: The endotracheal tube is 5.7 cm above the carina. The nasogastric tube extends well into the stomach. Diffuse  emphysematous changes are again evident. No pneumothorax. No confluent consolidation. No large effusion. IMPRESSION: ET and NG tubes appear satisfactorily positioned. Diffuse emphysematous changes are again evident. Electronically Signed   By: Andreas Newport M.D.   On: 05/25/2016 06:26     Studies:  Antibiotics: Rocephin 3/11 >> Zithromax 3/11 >>  Cultures: Blood 3/11 >> Sputum 3/11 >> Influenza 3/11 >> negative Pneumococcal Ag 3/11 >> Legionella Ag 3/11 >>  Lines/tubes: ETT 3/11 >>  Events: 3/11 Admit, acidosis >> d/c diprivan  Assessment/plan:  Acute hypoxic respiratory failure 2nd to CAP with AECOPD. Progressive RLL Pneumonia per CXR 3/12 Ongoing Tobacco abuse. Plan: - weaning  On 35%/5/8 - Trend  CXR - day 2 of Abx - solumedrol - scheduled duonebs  Lactic acidosis. Per ABG resolving Plan: - hemodynamically stable - adjust vent settings - Maintain saturations> 88-92% - f/u Lactic Acid 3/13 to ensure resolution  Leukocytosis Plan: - Trend CBC Daily - Repeat Lactic Acid 3/13 - Trend Fever Curve  Hyponatremia Hypokalemia>> resolved - NS IV fluid - replete electrolytes as needed - Trend BMET  Sedation. Plan: - sedation off 05/26/16 - Awake alert and appropriate - RASS goal 1-0  DVT prophylaxis - lovenox SUP - protonix Nutrition - tube feeds Goals of care - full code  Magdalen Spatz, AGACNP-BC [facility] 05/26/2016, 9:44 AM Pager:   call: [phone]

## 2016-05-26 NOTE — Progress Notes (Signed)
Patient pulled out ETT tube after stating that he felt like he had to throw up. Mittens were on and able to pull it out. Currently on 6L  and following commands. Sister [name] was updated by telephone of brother's status. Will continue to monitor closely. Modena Morrow E, RN 05/26/2016 1336

## 2016-05-26 NOTE — Progress Notes (Signed)
Pt self-extubated and pulled out OG as well. Placed on 4 lpm [place] sat 99%.  Pt able to speak has a good cough and started IS.  Pt able to do 750 cc x 10 with good cough.  Will continue to monitor patient.

## 2016-05-26 NOTE — Progress Notes (Signed)
Initial Nutrition Assessment  DOCUMENTATION CODES:   Not applicable  INTERVENTION:    Vital AF  at 65 ml/h (1560 ml per day)   Provides 1872 kcal, 117 gm protein, 1265 ml free water daily  NUTRITION DIAGNOSIS:   Inadequate oral intake related to inability to eat as evidenced by NPO status.  GOAL:   Patient will meet greater than or equal to 90% of their needs  MONITOR:   Vent status, TF tolerance, Labs, I & O's  REASON FOR ASSESSMENT:   Consult Enteral/tube feeding initiation and management  ASSESSMENT:   61 yo male smoker with dyspnea from AECOPD and PNA.  Hx of severe COPD with paraseptal emphysema.  Discussed patient with RN today. Received MD Consult for TF initiation and management. Nutrition-Focused physical exam completed. Findings are no fat depletion, mild muscle depletion, and no edema.  Patient is currently intubated on ventilator support MV: 11 L/min Temp (24hrs), Avg:98.7 F (37.1 C), Min:97.7 F (36.5 C), Max:100.3 F (37.9 C)  Propofol: none  Labs reviewed: sodium 131 (L); magnesium 2.5 (H) Medications reviewed.   Diet Order:  Diet NPO time specified  Skin:  Reviewed, no issues  Last BM:  PTA  Height:   Ht Readings from Last 1 Encounters:  05/26/16 5\' 8"  (1.727 m)    Weight:   Wt Readings from Last 1 Encounters:  05/26/16 158 lb 1.1 oz (71.7 kg)    Ideal Body Weight:     BMI:  Body mass index is 24.03 kg/m.  Estimated Nutritional Needs:   Kcal:  1913  Protein:  100-115 gm  Fluid:  2 L  EDUCATION NEEDS:   No education needs identified at this time  Molli Barrows, [place], [place], [place] Pager [phone] After Hours Pager [phone]

## 2016-05-26 NOTE — Progress Notes (Signed)
[facility] [place] Progress Note Patient Name: Blake Mcknight DOB: 02-24-1956 MRN: [id number]   Date of Service  05/26/2016  HPI/Events of Note  Multiple issues: 1. Headache and 2. Versed IV ordered while patient on vnetilator. Now off ventilator. D/C Versed?  eICU Interventions  Will order: 1. D/C Versed. 2. Tylenol 650 mg PO now and Q 6 hours PRN headache.      Intervention Category Intermediate Interventions: Pain - evaluation and management  Jerianne Anselmo [place] 05/26/2016, 10:15 PM

## 2016-05-27 ENCOUNTER — Inpatient Hospital Stay (HOSPITAL_COMMUNITY): Payer: Medicare Other

## 2016-05-27 LAB — BASIC METABOLIC PANEL
Anion gap: 8 (ref 5–15)
BUN: 14 mg/dL (ref 6–20)
CALCIUM: 8.3 mg/dL — AB (ref 8.9–10.3)
CO2: 28 mmol/L (ref 22–32)
CREATININE: 0.63 mg/dL (ref 0.61–1.24)
Chloride: 96 mmol/L — ABNORMAL LOW (ref 101–111)
GFR calc Af Amer: >60 mL/min (ref >60)
Glucose, Bld: 123 mg/dL — ABNORMAL HIGH (ref 65–99)
Potassium: 3.8 mmol/L (ref 3.5–5.1)
Sodium: 132 mmol/L — ABNORMAL LOW (ref 135–145)

## 2016-05-27 LAB — GLUCOSE, CAPILLARY
GLUCOSE-CAPILLARY: 123 mg/dL — AB (ref 65–99)
GLUCOSE-CAPILLARY: 136 mg/dL — AB (ref 65–99)
Glucose-Capillary: 101 mg/dL — ABNORMAL HIGH (ref 65–99)
Glucose-Capillary: 104 mg/dL — ABNORMAL HIGH (ref 65–99)
Glucose-Capillary: 118 mg/dL — ABNORMAL HIGH (ref 65–99)
Glucose-Capillary: 124 mg/dL — ABNORMAL HIGH (ref 65–99)

## 2016-05-27 LAB — LACTIC ACID, PLASMA: Lactic Acid, Venous: 1.6 mmol/L (ref 0.5–1.9)

## 2016-05-27 LAB — CBC
HCT: 38.7 % — ABNORMAL LOW (ref 39.0–52.0)
Hemoglobin: 12.9 g/dL — ABNORMAL LOW (ref 13.0–17.0)
MCH: 28.7 pg (ref 26.0–34.0)
MCHC: 33.3 g/dL (ref 30.0–36.0)
MCV: 86 fL (ref 78.0–100.0)
PLATELETS: 210 10*3/uL (ref 150–400)
RBC: 4.5 MIL/uL (ref 4.22–5.81)
RDW: 14.8 % (ref 11.5–15.5)
WBC: 20.4 10*3/uL — ABNORMAL HIGH (ref 4.0–10.5)

## 2016-05-27 LAB — MAGNESIUM: Magnesium: 2 mg/dL (ref 1.7–2.4)

## 2016-05-27 LAB — TROPONIN I: Troponin I: <0.03 ng/mL (ref <0.03)

## 2016-05-27 LAB — PHOSPHORUS: Phosphorus: 2.9 mg/dL (ref 2.5–4.6)

## 2016-05-27 MED ORDER — METHYLPREDNISOLONE SODIUM SUCC 40 MG IJ SOLR
40.0000 mg | Freq: Two times a day (BID) | INTRAMUSCULAR | Status: DC
  Administered 2016-05-27 – 2016-05-29 (×4): 40 mg via INTRAVENOUS
  Filled 2016-05-27 (×5): qty 1

## 2016-05-27 MED ORDER — DOCUSATE SODIUM 50 MG/5ML PO LIQD
100.0000 mg | Freq: Two times a day (BID) | ORAL | Status: DC | PRN
  Filled 2016-05-27: qty 10

## 2016-05-27 MED ORDER — FUROSEMIDE 10 MG/ML IJ SOLN
20.0000 mg | Freq: Once | INTRAMUSCULAR | Status: AC
  Administered 2016-05-27: 20 mg via INTRAVENOUS
  Filled 2016-05-27: qty 2

## 2016-05-27 MED ORDER — PANTOPRAZOLE SODIUM 40 MG PO TBEC
40.0000 mg | DELAYED_RELEASE_TABLET | Freq: Every day | ORAL | Status: DC
  Administered 2016-05-27 – 2016-05-29 (×3): 40 mg via ORAL
  Filled 2016-05-27 (×3): qty 1

## 2016-05-27 NOTE — Progress Notes (Signed)
Attempt to call report. Nurse will call back when available. Bartholomew Crews, RN 05/27/2016 4:08 PM

## 2016-05-27 NOTE — Progress Notes (Signed)
Family was called to update on patient transfer to [phone]. Bartholomew Crews, RN 05/27/2016 4:06 PM

## 2016-05-27 NOTE — Progress Notes (Signed)
Attempted call report again with no success. Will wait 10 mins and call charge RN to get report on patient. Bartholomew Crews, RN 05/27/2016 5:02 PM

## 2016-05-27 NOTE — Progress Notes (Signed)
Placed patient on BIPAP via FFM, 10/5 30% Fio2 for HS per MD order.  Patient tolerating well at this time. RT will continue to monitor.

## 2016-05-27 NOTE — Progress Notes (Signed)
PCCM Progress Note  Admission date: 05/24/2016 Referring provider: Abigail Butts, ER  CC: short of breath  HPI: 61 yo male smoker with dyspnea from AECOPD and PNA.  Hx of severe COPD with paraseptal emphysema.  Subjective: Alert and following commands, mild confusion, on room air with out distress.   Vital signs: BP 126/79 (BP Location: Left Arm)   Pulse 84   Temp 100 F (37.8 C) (Oral)   Resp 17   Ht 5\' 8"  (1.727 m)   Wt 71.7 kg (158 lb 1.1 oz)   SpO2 97%   BMI 24.03 kg/m   Intake/output:  Intake/Output Summary (Last 24 hours) at 05/27/16 0831 Last data filed at 05/27/16 0800  Gross per 24 hour  Intake          1946.75 ml  Output             3385 ml  Net         -1438.25 ml    General: Alert and appropriate, on room air without distress Neuro: Alert with mild confusion HEENT:atraumatic normocephalic, neg JVD Cardiac: RRR, no rubs murmurs or gallops noted Chest:  clear breath sounds but decreased in bases : distended , non tender, BS +t Ext: No edema noted, warm with brisk refill Skin: intact without rash noted   CMP Latest Ref Rng & Units 05/27/2016 05/26/2016 05/25/2016  Glucose 65 - 99 mg/dL 123(H) 160(H) 174(H)  BUN 6 - 20 mg/dL 14 11 8   Creatinine 0.61 - 1.24 mg/dL 0.63 0.65 0.76  Sodium 135 - 145 mmol/L 132(L) 131(L) 128(L)  Potassium 3.5 - 5.1 mmol/L 3.8 4.9 3.2(L)  Chloride 101 - 111 mmol/L 96(L) 100(L) 91(L)  CO2 22 - 32 mmol/L 28 23 24   Calcium 8.9 - 10.3 mg/dL 8.3(L) 8.2(L) 9.1  Total Protein 6.5 - 8.1 g/dL - - -  Total Bilirubin 0.3 - 1.2 mg/dL - - -  Alkaline Phos 38 - 126 U/L - - -  AST 15 - 41 U/L - - -  ALT 17 - 63 U/L - - -     CBC Latest Ref Rng & Units 05/27/2016 05/26/2016 05/25/2016  WBC 4.0 - 10.5 K/uL 20.4(H) 14.7(H) 12.4(H)  Hemoglobin 13.0 - 17.0 g/dL 12.9(L) 14.0 13.0  Hematocrit 39.0 - 52.0 % 38.7(L) 41.4 38.7(L)  Platelets 150 - 400 K/uL 210 217 211     ABG    Component Value Date/Time   PHART 7.455 (H) 05/26/2016  1531   PCO2ART 36.7 05/26/2016 1531   PO2ART 87.0 05/26/2016 1531   HCO3 25.8 05/26/2016 1531   TCO2 27 05/26/2016 1531   ACIDBASEDEF 1.8 05/26/2016 0325   O2SAT 97.0 05/26/2016 1531     CBG (last 3)   Recent Labs  05/26/16 1932 05/27/16 0009 05/27/16 0313  GLUCAP 115* 124* 123*     Imaging: Ct Angio Chest Pe W Or Wo Contrast  Result Date: 05/25/2016 CLINICAL DATA:  Respiratory distress, hypoxemia EXAM: CT ANGIOGRAPHY CHEST WITH CONTRAST TECHNIQUE: Multidetector CT imaging of the chest was performed using the standard protocol during bolus administration of intravenous contrast. Multiplanar CT image reconstructions and MIPs were obtained to evaluate the vascular anatomy. CONTRAST:  100 mL Isovue-300 COMPARISON:  None. FINDINGS: Cardiovascular: Satisfactory opacification of the pulmonary arteries to the segmental level. No evidence of pulmonary embolism. Normal heart size. No pericardial effusion. Normal caliber thoracic aorta. Thoracic aortic dissection. Mediastinum/Nodes: No enlarged mediastinal, hilar, or axillary lymph nodes. Thyroid gland, trachea, and esophagus demonstrate no significant findings. Lungs/Pleura:  Severe bilateral emphysematous changes. Right lower lobe airspace disease concerning for atelectasis versus pneumonia. No pleural effusion or pneumothorax. Upper Abdomen: No acute abnormality. Musculoskeletal: No chest wall abnormality. No acute or significant osseous findings. Review of the MIP images confirms the above findings. IMPRESSION: 1. No evidence pulmonary embolus. 2. Right lower lobe airspace disease which may reflect atelectasis versus pneumonia. Electronically Signed   By: Kathreen Devoid   On: 05/25/2016 12:58   Dg Chest Port 1 View  Result Date: 05/27/2016 CLINICAL DATA:  Respiratory failure EXAM: PORTABLE CHEST 1 VIEW COMPARISON:  05/26/2016 FINDINGS: Heart is normal size. Severe emphysematous changes and chronic changes in the lungs. Some improvement in the  right lower lobe infiltrate since prior study. No new infiltrates or visible effusions. IMPRESSION: Severe COPD/chronic changes.  Improving right lower lobe pneumonia. Electronically Signed   By: Rolm Baptise M.D.   On: 05/27/2016 07:09   Dg Chest Port 1 View  Result Date: 05/26/2016 CLINICAL DATA:  Respiratory failure.  Shortness of breath. EXAM: PORTABLE CHEST 1 VIEW COMPARISON:  01/2017. FINDINGS: Endotracheal tube in satisfactory position. Nasogastric tube extending into the stomach. Extensive bilateral bullous changes. Progressive right lower lobe airspace opacity. No acute bony abnormality. Normal sized heart. IMPRESSION: 1. Progressive right lower lobe pneumonia. 2. Stable marked changes of COPD with extensive bullous emphysema. Electronically Signed   By: Claudie Revering M.D.   On: 05/26/2016 07:21     Studies:  Antibiotics: Rocephin 3/11 >> Zithromax 3/11 >>  Cultures: Blood 3/11 >> Sputum 3/11 >> Influenza 3/11 >> negative Pneumococcal Ag 3/11 >> Legionella Ag 3/11 >>  Lines/tubes: ETT 3/11 >>3/12  Events: 3/11 Admit, acidosis >> d/c diprivan 3/12 self extubation 3/13 transfer to floor and to [facility].  Assessment/plan:  Acute hypoxic respiratory failure 2nd to CAP with AECOPD. Resolved 3/13 Progressive RLL Pneumonia per CXR 3/12 Ongoing Tobacco abuse. Plan: -Leave O2 off for sats >92% - Trend  CXR - day 3/7 of Abx - solumedrol, decreased 3/13 to 40 mg q 12 - scheduled duonebs - Pulmonary toilet  Lactic acidosis. Resolved  Per ABG resolving Plan: - hemodynamically stable - Maintain saturations> 88-92%   Leukocytosis Plan: - Trend CBC Daily - Repeat Lactic Acid 3/13 1.6 resolved - Trend Fever Curve  Hyponatremia Hypokalemia>> resolved - DC IVF - replete electrolytes as needed - Trend BMET  Sedation. Plan: - sedation off 05/26/16, dc all IV sedation - Awake alert and appropriate 3/13 - RASS goal 1-0  DVT prophylaxis - lovenox SUP -  protonix Nutrition -Start PO Goals of care - full code 3/13 Transfer to sdu and to [facility] service for 3/14. Page sent to Dr. Reesa Chew 0830 3/13  App CCT 30 min   Richardson Landry Ledia Hanford ACNP Maryanna Shape PCCM Pager [phone] till 3 pm If no answer page [phone] 05/27/2016, 8:12 AM

## 2016-05-27 NOTE — Evaluation (Signed)
Clinical/Bedside Swallow Evaluation Patient Details  Name: Blake Mcknight MRN: [id number] Date of Birth: 20-Nov-1955  Today's Date: 05/27/2016 Time: SLP Start Time (ACUTE ONLY): 4496 SLP Stop Time (ACUTE ONLY): 1011 SLP Time Calculation (min) (ACUTE ONLY): 13 min  Past Medical History:  Past Medical History:  Diagnosis Date  . COPD (chronic obstructive pulmonary disease) (HCC)    with bullous emphysema.   Marland Kitchen Heavy cigarette smoker before 2003   pt claims only 10 cigs per day, never heavier amounts.   Marland Kitchen HLD (hyperlipidemia)   . Hypertension   . Schizophrenia ([place]) 06/28/2013   This is a chronic condition and he lives with family   Past Surgical History:  Past Surgical History:  Procedure Laterality Date  . CHOLECYSTECTOMY N/A 06/06/2013   Procedure: LAPAROSCOPIC CHOLECYSTECTOMY WITH ATTEMPTED INTRAOPERATIVE CHOLANGIOGRAM;  Surgeon: Zenovia Jarred, MD;  Location: [facility];  Service: General;  Laterality: N/A;  . ERCP N/A 06/03/2013   Procedure: ENDOSCOPIC RETROGRADE CHOLANGIOPANCREATOGRAPHY (ERCP);  Surgeon: Ladene Artist, MD;  Location: [facility] ENDOSCOPY;  Service: Endoscopy;  Laterality: N/A;  . NO PAST SURGERIES     HPI:  61 y/o man with a history of longstanding tobacco abuse and severe COPD (FEV1 the 30s in 2015), schizophrenia, who presents with worsening respiratory failure; ETT 3/11-3/12 self extubated.    Assessment / Plan / Recommendation Clinical Impression  Pt presents with adequate oropharyngeal swallow with normal mastication, brisk swallow response, adequate swallow/ventilatory reciprocity, no overt s/s of aspiration.  Passed three oz water test without difficulty.  Vocal quality good, low volume but no dysphonia after one day intubation and self-extubation.  Recommend resuming a regular diet, thin liquids.  No SLP f/u warranted.   SLP Visit Diagnosis: Dysphagia, unspecified (R13.10)    Aspiration Risk  No limitations    Diet Recommendation     Medication  Administration: Whole meds with liquid    Other  Recommendations Oral Care Recommendations: Oral care BID   Follow up Recommendations None      Frequency and Duration            Prognosis        Swallow Study   General Date of Onset: 05/25/16 HPI: 61 y/o man with a history of longstanding tobacco abuse and severe COPD (FEV1 the 30s in 2015), schizophrenia, who presents with worsening respiratory failure; ETT 3/11-3/12 self extubated.  Type of Study: Bedside Swallow Evaluation Diet Prior to this Study: NPO Temperature Spikes Noted: No Respiratory Status: Nasal cannula History of Recent Intubation: Yes Length of Intubations (days): 1 days Date extubated: 05/26/16 Behavior/Cognition: Alert;Requires cueing Oral Cavity Assessment: Within Functional Limits Oral Care Completed by SLP: No Oral Cavity - Dentition: Adequate natural dentition Vision: Functional for self-feeding Self-Feeding Abilities: Able to feed self Patient Positioning: Upright in chair Baseline Vocal Quality: Normal;Low vocal intensity Volitional Cough: Strong Volitional Swallow: Able to elicit    Oral/Motor/Sensory Function Overall Oral Motor/Sensory Function: Within functional limits   Ice Chips Ice chips: Within functional limits   Thin Liquid Thin Liquid: Within functional limits    Nectar Thick Nectar Thick Liquid: Not tested   Honey Thick Honey Thick Liquid: Not tested   Puree Puree: Within functional limits   Solid   GO   Solid: Within functional limits        Juan Quam Laurice 05/27/2016,10:17 AM

## 2016-05-27 NOTE — Progress Notes (Signed)
Pt. Off BIPAP and placed on 2L [place]. Requesting a break from BIPAP. No distress noted at this time.

## 2016-05-28 ENCOUNTER — Inpatient Hospital Stay (HOSPITAL_COMMUNITY): Payer: Medicare Other

## 2016-05-28 DIAGNOSIS — J441 Chronic obstructive pulmonary disease with (acute) exacerbation: Secondary | ICD-10-CM

## 2016-05-28 LAB — BASIC METABOLIC PANEL
Anion gap: 11 (ref 5–15)
BUN: 10 mg/dL (ref 6–20)
CHLORIDE: 91 mmol/L — AB (ref 101–111)
CO2: 28 mmol/L (ref 22–32)
Calcium: 8.3 mg/dL — ABNORMAL LOW (ref 8.9–10.3)
Creatinine, Ser: 0.59 mg/dL — ABNORMAL LOW (ref 0.61–1.24)
GFR calc Af Amer: >60 mL/min (ref >60)
GLUCOSE: 125 mg/dL — AB (ref 65–99)
POTASSIUM: 3.6 mmol/L (ref 3.5–5.1)
Sodium: 130 mmol/L — ABNORMAL LOW (ref 135–145)

## 2016-05-28 LAB — GLUCOSE, CAPILLARY
GLUCOSE-CAPILLARY: 102 mg/dL — AB (ref 65–99)
Glucose-Capillary: 102 mg/dL — ABNORMAL HIGH (ref 65–99)
Glucose-Capillary: 113 mg/dL — ABNORMAL HIGH (ref 65–99)
Glucose-Capillary: 124 mg/dL — ABNORMAL HIGH (ref 65–99)
Glucose-Capillary: 133 mg/dL — ABNORMAL HIGH (ref 65–99)
Glucose-Capillary: 86 mg/dL (ref 65–99)

## 2016-05-28 LAB — CBC
HEMATOCRIT: 37.5 % — AB (ref 39.0–52.0)
HEMOGLOBIN: 12.6 g/dL — AB (ref 13.0–17.0)
MCH: 28.9 pg (ref 26.0–34.0)
MCHC: 33.6 g/dL (ref 30.0–36.0)
MCV: 86 fL (ref 78.0–100.0)
PLATELETS: 232 10*3/uL (ref 150–400)
RBC: 4.36 MIL/uL (ref 4.22–5.81)
RDW: 14.6 % (ref 11.5–15.5)
WBC: 15.7 10*3/uL — AB (ref 4.0–10.5)

## 2016-05-28 LAB — MAGNESIUM: Magnesium: 2.1 mg/dL (ref 1.7–2.4)

## 2016-05-28 LAB — PHOSPHORUS: Phosphorus: 3.1 mg/dL (ref 2.5–4.6)

## 2016-05-28 MED ORDER — LEVOFLOXACIN 500 MG PO TABS
500.0000 mg | ORAL_TABLET | ORAL | Status: DC
  Administered 2016-05-28: 500 mg via ORAL
  Filled 2016-05-28: qty 1

## 2016-05-28 MED ORDER — IPRATROPIUM-ALBUTEROL 0.5-2.5 (3) MG/3ML IN SOLN
3.0000 mL | Freq: Three times a day (TID) | RESPIRATORY_TRACT | Status: DC
  Administered 2016-05-28 – 2016-05-29 (×4): 3 mL via RESPIRATORY_TRACT
  Filled 2016-05-28 (×4): qty 3

## 2016-05-28 NOTE — Evaluation (Signed)
Physical Therapy Evaluation Patient Details Name: Blake Mcknight MRN: [id number] DOB: 03-22-1955 Today's Date: 05/28/2016   History of Present Illness  61 y/o man with a history of longstanding tobacco abuse and severe COPD (FEV1 the 30s in 2015), schizophrenia, who presents with worsening respiratory failure; ETT 3/11-3/12 self extubated.    Clinical Impression  Patient presents with decreased balance and generalized weakness limiting mobility and he will benefit from skilled PT in the acute setting to allow return home with family support.  Functional mobility improved with oxygen, though may not qualify due to nadir 89% on RA.     Follow Up Recommendations No PT follow up    Equipment Recommendations  None recommended by PT    Recommendations for Other Services       Precautions / Restrictions Precautions Precautions: None      Mobility  Bed Mobility Overal bed mobility: Modified Independent                Transfers Overall transfer level: Independent                  Ambulation/Gait Ambulation/Gait assistance: Independent Ambulation Distance (Feet): 200 Feet (and 100') Assistive device: None Gait Pattern/deviations: Step-through pattern;Decreased stride length;Drifts right/left     General Gait Details: mild evidence for imbalance, but no LOB and ambulates unaided without devices  Stairs            Wheelchair Mobility    Modified Rankin (Stroke Patients Only)       Balance Overall balance assessment: No apparent balance deficits (not formally assessed)                                           Pertinent Vitals/Pain Pain Assessment: No/denies pain    Home Living Family/patient expects to be discharged to:: Private residence Living Arrangements: Other relatives (sister) Available Help at Discharge: Family;Available 24 hours/day Type of Home: House Home Access: Stairs to enter Entrance Stairs-Rails:  None Entrance Stairs-Number of Steps: 2 Home Layout: One level Home Equipment: None      Prior Function Level of Independence: Needs assistance      ADL's / Homemaking Assistance Needed: does his own showering and dressing, reports sister does all chores and cooking        Hand Dominance        Extremity/Trunk Assessment        Lower Extremity Assessment Lower Extremity Assessment: Overall WFL for tasks assessed       Communication   Communication: No difficulties  Cognition Arousal/Alertness: Awake/alert Behavior During Therapy: WFL for tasks assessed/performed Overall Cognitive Status: Within Functional Limits for tasks assessed                 General Comments: orientation not specifically tested    General Comments General comments (skin integrity, edema, etc.): Ambulated initially on RA; SpO2 91-89% and pt audibly wheezing, RR up to 25 and limited gait with rest in between to 100'; with 2L O2 able to walk twice distance without rest break and RR 21.    Exercises     Assessment/Plan    PT Assessment Patient needs continued PT services  PT Problem List Decreased activity tolerance;Decreased balance;Decreased strength;Cardiopulmonary status limiting activity       PT Treatment Interventions DME instruction;Gait training;Stair training;Functional mobility training;Balance training;Therapeutic exercise;Patient/family education;Therapeutic activities    PT Goals (Current  goals can be found in the Care Plan section)  Acute Rehab PT Goals Patient Stated Goal: To return to home PT Goal Formulation: With patient Time For Goal Achievement: 06/04/16 Potential to Achieve Goals: Good    Frequency Min 3X/week   Barriers to discharge        Co-evaluation               End of Session Equipment Utilized During Treatment: Oxygen Activity Tolerance: Patient limited by fatigue Patient left: with call bell/phone within reach;in bed Nurse  Communication: Mobility status PT Visit Diagnosis: Other abnormalities of gait and mobility (R26.89)         Time: 4784-1282 PT Time Calculation (min) (ACUTE ONLY): 22 min   Charges:   PT Evaluation $PT Eval Moderate Complexity: 1 Procedure     PT G CodesReginia Naas 06-14-16, 12:04 PM  Magda Kiel, [place] 06-14-16

## 2016-05-28 NOTE — Progress Notes (Signed)
PROGRESS NOTE    Blake Mcknight  YOV:[id number] DOB: [birth year] DOA: 05/24/2016 PCP: Leola Brazil, MD   Brief Narrative: 61 year old male active smoker, severe COPD presented with worsening respiratory failure. Patient was admitted by critical care service. He required BiPAP on admission. He was found to have pneumonia and COPD exacerbation. Patient was intubated and later patient self extubated. Patient was transferred to [facility] on 3/14.  Assessment & Plan:   Active Problems:   Acute on chronic respiratory failure with hypoxia (HCC)  #Acute on chronic hypoxic respiratory failure due to community acquired pneumonia and COPD exacerbation: -Chest x-ray with right lower lobe pneumonia. -Continue Solu-Medrol twice a day, bronchodilators and ceftriaxone. I will switch antibiotics to Levaquin. May be able to switch to oral prednisone tomorrow. Try to wean oxygen gradually. Discussed with the nursing staff. Continue supportive care. PT, OT evaluation.  #Hyponatremia: Monitor BMP. Patient has normal serum creatinine level. Encourage oral intake.  #Leukocytosis: WBC trended down. Monitor CBC.  DVT prophylaxis:Lovenox subcutaneous  Code Status:Full code  Family Communication:No family present at bedside  Disposition Plan:Likely discharge home in 1-2 days    Consultants:   Transferred patient's care from pulmonary critical care service  Procedures: Lines/tubes: ETT 3/11 >>3/12  Events: 3/11 Admit, acidosis >> d/c diprivan 3/12 self extubation 3/14 transfer to floor and to [facility].  Antimicrobials: Rocephin 3/11 >>3/14 levaquin since 3/14  Subjective: Patient was seen and examined at bedside. Reported doing well but is still having wheezing. Denied nausea vomiting chest pain.  Objective: Vitals:   05/28/16 0500 05/28/16 0754 05/28/16 0936 05/28/16 1119  BP:  135/82  (!) 112/56  Pulse: 75 78  95  Resp: 13 19  17   Temp:  98.1 F (36.7 C)  98.7 F (37.1 C)    TempSrc:  Oral  Oral  SpO2: 98% 94% 99% 95%  Weight:      Height:        Intake/Output Summary (Last 24 hours) at 05/28/16 1307 Last data filed at 05/28/16 1225  Gross per 24 hour  Intake              990 ml  Output             2300 ml  Net            -1310 ml   Filed Weights   05/26/16 0407 05/27/16 0347 05/28/16 0423  Weight: 71.7 kg (158 lb 1.1 oz) 71.7 kg (158 lb 1.1 oz) 71.4 kg (157 lb 6.4 oz)    Examination:  General exam: Appears calm and comfortable  Respiratory system:Mild bilateral expiratory wheeze. Respiratory effort normal.  Cardiovascular system: S1 & S2 heard, RRR.  No pedal edema. Gastrointestinal system: Abdomen is nondistended, soft and nontender. Normal bowel sounds heard. Central nervous system: Alert and oriented. No focal neurological deficits. Extremities: Symmetric 5 x 5 power. Skin: No rashes, lesions or ulcers Psychiatry: Judgement and insight appear normal. Mood & affect appropriate.     Data Reviewed: I have personally reviewed following labs and imaging studies  CBC:  Recent Labs Lab 05/25/16 0015 05/25/16 1427 05/26/16 0427 05/27/16 0206 05/28/16 0353  WBC 10.4 12.4* 14.7* 20.4* 15.7*  NEUTROABS 7.4  --   --   --   --   HGB 16.0 13.0 14.0 12.9* 12.6*  HCT 44.5 38.7* 41.4 38.7* 37.5*  MCV 86.1 86.4 86.4 86.0 86.0  PLT 242 211 217 210 741   Basic Metabolic Panel:  Recent Labs Lab 05/25/16 0015  05/25/16 1427 05/26/16 0427 05/27/16 0206 05/28/16 0353  NA 128*  --  131* 132* 130*  K 3.2*  --  4.9 3.8 3.6  CL 91*  --  100* 96* 91*  CO2 24  --  23 28 28   GLUCOSE 174*  --  160* 123* 125*  BUN 8  --  11 14 10   CREATININE 0.76  --  0.65 0.63 0.59*  CALCIUM 9.1  --  8.2* 8.3* 8.3*  MG  --  2.4 2.5* 2.0 2.1  PHOS  --  3.3 2.9 2.9 3.1   GFR: Estimated Creatinine Clearance: 97.4 mL/min (by C-G formula based on SCr of 0.59 mg/dL (L)). Liver Function Tests: No results for input(s): AST, ALT, ALKPHOS, BILITOT, PROT, ALBUMIN in the  last 168 hours. No results for input(s): LIPASE, AMYLASE in the last 168 hours. No results for input(s): AMMONIA in the last 168 hours. Coagulation Profile: No results for input(s): INR, PROTIME in the last 168 hours. Cardiac Enzymes:  Recent Labs Lab 05/27/16 0206  TROPONINI <0.03   BNP (last 3 results) No results for input(s): PROBNP in the last 8760 hours. HbA1C: No results for input(s): HGBA1C in the last 72 hours. CBG:  Recent Labs Lab 05/27/16 1953 05/28/16 0007 05/28/16 0421 05/28/16 0757 05/28/16 1121  GLUCAP 101* 124* 133* 102* 102*   Lipid Profile: No results for input(s): CHOL, HDL, LDLCALC, TRIG, CHOLHDL, LDLDIRECT in the last 72 hours. Thyroid Function Tests: No results for input(s): TSH, T4TOTAL, FREET4, T3FREE, THYROIDAB in the last 72 hours. Anemia Panel: No results for input(s): VITAMINB12, FOLATE, FERRITIN, TIBC, IRON, RETICCTPCT in the last 72 hours. Sepsis Labs:  Recent Labs Lab 05/25/16 0865 05/25/16 1427 05/26/16 1536 05/27/16 0206  LATICACIDVEN 6.74* 5.2* 3.3* 1.6    Recent Results (from the past 240 hour(s))  Blood Culture (routine x 2)     Status: None (Preliminary result)   Collection Time: 05/25/16 12:21 AM  Result Value Ref Range Status   Specimen Description BLOOD RIGHT ANTECUBITAL  Final   Special Requests BOTTLES DRAWN AEROBIC ONLY 5CC  Final   Culture NO GROWTH 3 DAYS  Final   Report Status PENDING  Incomplete  Blood Culture (routine x 2)     Status: None (Preliminary result)   Collection Time: 05/25/16  3:31 AM  Result Value Ref Range Status   Specimen Description BLOOD RIGHT FOREARM  Final   Special Requests BOTTLES DRAWN AEROBIC AND ANAEROBIC 5CC EA  Final   Culture NO GROWTH 3 DAYS  Final   Report Status PENDING  Incomplete  MRSA PCR Screening     Status: None   Collection Time: 05/25/16  9:14 AM  Result Value Ref Range Status   MRSA by PCR NEGATIVE NEGATIVE Final    Comment:        The GeneXpert MRSA Assay  (FDA approved for NASAL specimens only), is one component of a comprehensive MRSA colonization surveillance program. It is not intended to diagnose MRSA infection nor to guide or monitor treatment for MRSA infections.          Radiology Studies: Dg Chest Port 1 View  Result Date: 05/28/2016 CLINICAL DATA:  Shortness of breath, respiratory failure, COPD, hypertension, smoker EXAM: PORTABLE CHEST 1 VIEW COMPARISON:  Portable exam 0701 hours compared to 05/27/2016 FINDINGS: Normal heart size, mediastinal contours, and pulmonary vascularity. Severe emphysematous bullous disease consistent with COPD. Scattered areas of parenchymal scarring in both lungs. Persistent RIGHT basilar infiltrate. No gross pleural effusion or  pneumothorax. Bones unremarkable. IMPRESSION: Bullous COPD changes with persistent RIGHT basilar infiltrate. Electronically Signed   By: Lavonia Dana M.D.   On: 05/28/2016 08:00   Dg Chest Port 1 View  Result Date: 05/27/2016 CLINICAL DATA:  Respiratory failure EXAM: PORTABLE CHEST 1 VIEW COMPARISON:  05/26/2016 FINDINGS: Heart is normal size. Severe emphysematous changes and chronic changes in the lungs. Some improvement in the right lower lobe infiltrate since prior study. No new infiltrates or visible effusions. IMPRESSION: Severe COPD/chronic changes.  Improving right lower lobe pneumonia. Electronically Signed   By: Rolm Baptise M.D.   On: 05/27/2016 07:09        Scheduled Meds: . cefTRIAXone (ROCEPHIN)  IV  1 g Intravenous Q24H  . enoxaparin (LOVENOX) injection  40 mg Subcutaneous Q24H  . ipratropium-albuterol  3 mL Nebulization TID  . mouth rinse  15 mL Mouth Rinse BID  . methylPREDNISolone (SOLU-MEDROL) injection  40 mg Intravenous Q12H  . pantoprazole  40 mg Oral Q1200   Continuous Infusions:   LOS: 3 days    Persis Graffius Tanna Furry, MD [facility] Pager [phone]  If 7PM-7AM, please contact night-coverage [email or web address] Password  [email or web address] 05/28/2016, 1:07 PM

## 2016-05-28 NOTE — Progress Notes (Signed)
Pt sister requested all 4 side rails and bed alarm be on when pt is alone.   Blake Mcknight E

## 2016-05-28 NOTE — Progress Notes (Signed)
SATURATION QUALIFICATIONS: (This note is used to comply with regulatory documentation for home oxygen)  Patient Saturations on Room Air at Rest = 91%  Patient Saturations on Room Air while Ambulating = 89%  Patient Saturations on 2 Liters of oxygen while Ambulating = 91%  Please briefly explain why patient needs home oxygen:  Patient ambulated 50' on RA before needing to stop to rest and noted audible wheezing with RR 25.  Able to walk 200' no rest break on 2L O2 with minimal wheezing and RR 21.  Feel he would benefit from home O2 due to increased tolerance to activity in 61 y/o male otherwise independent for mobility.   [place], [place] 05/28/2016

## 2016-05-28 NOTE — Progress Notes (Signed)
Patient is supposed to wear BiPAP QHS per MD. Bipap is in room, however, patient is refusing to wear at this time. Patient also did not want to wear nasal cannula but he agreed to place it back on. RT will try again later to place patient on Bipap.

## 2016-05-28 NOTE — Consult Note (Signed)
           [facility] CM Primary Care Navigator  05/28/2016  DEANO TOMASZEWSKI 1955/12/23 [id number]   Went to see patient at the bedside to identify possible discharge needs. Patient reports having nasal congestion with trouble breathing and chest discomfort that had led to this admission.  Patient endorses Dr. [name] with Ernestene Kiel MD office as his primary care provider.   Patient states using [facility] to obtain medications without difficulty.   Patient verbalized that sister [name]) manages his medications at home using "pill box" system.   He states that sister is able to provide transportation to his doctors' appointments.  He mentioned living with his sister who provides assistance with his care.  His brother Herbie [place]) who lives nearby can also provide help when needed per patient.  Discharge plan is home per patient. No PT follow-up recommendations per note.  Patient voiced understanding to call primary care provider's office when he gets back home, for a post discharge follow-up appointment within a week or sooner if needed. Patient letter (with PCP's contact number) was provided as a reminder.  Explained to patient about [facility] CM services available for management of COPD/ PNA but he declined services at present. He states that his sister and doctor are helping him manage it. [facility] care management contact information provided for future needs that may arise.     For questions, please contact:  Dannielle Huh, BSN, RN- [facility] Primary Care Navigator  Telephone: [phone] [place]

## 2016-05-28 NOTE — Progress Notes (Signed)
Patient states he is not wearing BiPAP tonight. RT instructed patient on the importance of BiPAP and that his doctor wanted him to wear it. Patient still refused. RT will ask patient again later about wearing BiPAP.

## 2016-05-29 LAB — GLUCOSE, CAPILLARY
GLUCOSE-CAPILLARY: 181 mg/dL — AB (ref 65–99)
GLUCOSE-CAPILLARY: 93 mg/dL (ref 65–99)
Glucose-Capillary: 110 mg/dL — ABNORMAL HIGH (ref 65–99)
Glucose-Capillary: 112 mg/dL — ABNORMAL HIGH (ref 65–99)
Glucose-Capillary: 80 mg/dL (ref 65–99)

## 2016-05-29 LAB — CBC
HCT: 38.9 % — ABNORMAL LOW (ref 39.0–52.0)
Hemoglobin: 13.5 g/dL (ref 13.0–17.0)
MCH: 29.6 pg (ref 26.0–34.0)
MCHC: 34.7 g/dL (ref 30.0–36.0)
MCV: 85.3 fL (ref 78.0–100.0)
PLATELETS: 219 10*3/uL (ref 150–400)
RBC: 4.56 MIL/uL (ref 4.22–5.81)
RDW: 14.4 % (ref 11.5–15.5)
WBC: 12.4 10*3/uL — AB (ref 4.0–10.5)

## 2016-05-29 LAB — BASIC METABOLIC PANEL
Anion gap: 7 (ref 5–15)
BUN: 11 mg/dL (ref 6–20)
CALCIUM: 8.4 mg/dL — AB (ref 8.9–10.3)
CO2: 28 mmol/L (ref 22–32)
CREATININE: 0.59 mg/dL — AB (ref 0.61–1.24)
Chloride: 94 mmol/L — ABNORMAL LOW (ref 101–111)
GFR calc Af Amer: >60 mL/min (ref >60)
Glucose, Bld: 109 mg/dL — ABNORMAL HIGH (ref 65–99)
Potassium: 3.6 mmol/L (ref 3.5–5.1)
SODIUM: 129 mmol/L — AB (ref 135–145)

## 2016-05-29 LAB — MAGNESIUM: MAGNESIUM: 2.1 mg/dL (ref 1.7–2.4)

## 2016-05-29 LAB — PHOSPHORUS: Phosphorus: 2.8 mg/dL (ref 2.5–4.6)

## 2016-05-29 MED ORDER — LEVOFLOXACIN 500 MG PO TABS: 500.0000 mg | ORAL_TABLET | ORAL | 0 refills | Status: AC

## 2016-05-29 MED ORDER — PREDNISONE 10 MG PO TABS: 10.0000 mg | ORAL_TABLET | Freq: Every day | ORAL | 0 refills | Status: DC

## 2016-05-29 NOTE — Care Management Important Message (Signed)
Important Message  Patient Details  Name: Blake Mcknight MRN: [id number] Date of Birth: 09-30-1955   Medicare Important Message Given:  Yes    Nathen May 05/29/2016, 12:57 PM

## 2016-05-29 NOTE — Discharge Summary (Addendum)
Physician Discharge Summary  Blake Mcknight ZLD:[id number] DOB: 11-02-1955 DOA: 05/24/2016  PCP: Leola Brazil, MD  Admit date: 05/24/2016 Discharge date: 05/29/2016  Admitted From:home Disposition:home  Recommendations for Outpatient Follow-up:  1. Follow up with PCP in 1-2 weeks 2. Please obtain BMP/CBC in one week   Home Health:no Equipment/Devices:no Discharge Condition:stable CODE STATUS:full Diet recommendation:heart healthy  Brief/Interim Summary:61 year old male active smoker, severe COPD presented with worsening respiratory failure. Patient was admitted by critical care service. He required BiPAP on admission. He was found to have pneumonia and COPD exacerbation. Patient was intubated and later patient self extubated.  #Acute on chronic hypoxic respiratory failure due to community acquired pneumonia and COPD exacerbation: -Chest x-ray with right lower lobe pneumonia. -Patient is clinically improved. Initially treated with Solu-Medrol and switched to oral prednisone on discharge. Discharge with oral Levaquin. His oxygen saturation dropped while ambulation therefore discharge with 2 liters of oxygen and home care nurses.  Denies shortness of breath, cough, chest pain, nausea or vomiting. I recommended patient to follow-up with PCP.  #Hyponatremia: Patient has normal serum creatinine level. Encourage oral intake. Advised patient to monitor labs with PCP in a week.  #Leukocytosis: WBC trended down. Monitor CBC.  Patient is clinically improved. Discharge with oral antibiotics, oxygen and prednisone with tapering dose.  Evaluated by PT and OT. Able to ambulate.  Discharge Diagnoses:  Active Problems:   Acute on chronic respiratory failure with hypoxia [facility])    Discharge Instructions  Discharge Instructions    Call MD for:  difficulty breathing, headache or visual disturbances    Complete by:  As directed    Call MD for:  extreme fatigue    Complete by:  As  directed    Call MD for:  hives    Complete by:  As directed    Call MD for:  persistant dizziness or light-headedness    Complete by:  As directed    Call MD for:  persistant nausea and vomiting    Complete by:  As directed    Call MD for:  severe uncontrolled pain    Complete by:  As directed    Call MD for:  temperature >100.4    Complete by:  As directed    Diet - low sodium heart healthy    Complete by:  As directed    Discharge instructions    Complete by:  As directed    Follow up with PCP   Face-to-face encounter (required for Medicare/Medicaid patients)    Complete by:  As directed    I Dron Tanna Furry certify that this patient is under my care and that I, or a nurse practitioner or physician's assistant working with me, had a face-to-face encounter that meets the physician face-to-face encounter requirements with this patient on 05/29/2016. The encounter with the patient was in whole, or in part for the following medical condition(s) which is the primary reason for home health care (List medical condition): hypoxia   The encounter with the patient was in whole, or in part, for the following medical condition, which is the primary reason for home health care:  hypoxia   I certify that, based on my findings, the following services are medically necessary home health services:  Nursing   Reason for Medically Necessary Home Health Services:  Skilled Nursing- Skilled Assessment/Observation   My clinical findings support the need for the above services:  Cognitive impairments, dementia, or mental confusion  that make it unsafe to leave home  Further, I certify that my clinical findings support that this patient is homebound due to:  Shortness of Breath with activity   For home use only DME oxygen    Complete by:  As directed    Mode or (Route):  Nasal cannula   Liters per Minute:  2   Frequency:  Continuous (stationary and portable oxygen unit needed)   Oxygen conserving  device:  Yes   Oxygen delivery system:  Gas   Home Health    Complete by:  As directed    To provide the following care/treatments:  RN   Increase activity slowly    Complete by:  As directed      Allergies as of 05/29/2016   No Known Allergies     Medication List    STOP taking these medications   tiotropium 18 MCG inhalation capsule Commonly known as:  SPIRIVA     TAKE these medications   ADVAIR HFA 115-21 MCG/ACT inhaler Generic drug:  fluticasone-salmeterol Inhale 1 puff into the lungs 2 (two) times daily.   amLODipine 10 MG tablet Commonly known as:  NORVASC Take 10 mg by mouth daily.   atorvastatin 10 MG tablet Commonly known as:  LIPITOR Take 10 mg by mouth daily.   benztropine 2 MG tablet Commonly known as:  COGENTIN Take 2 mg by mouth 3 (three) times daily.   HYDROcodone-acetaminophen 7.5-325 MG tablet Commonly known as:  NORCO Take 1 tablet by mouth 4 (four) times daily.   levofloxacin 500 MG tablet Commonly known as:  LEVAQUIN Take 1 tablet (500 mg total) by mouth daily.   mirtazapine 30 MG tablet Commonly known as:  REMERON Take 30 mg by mouth at bedtime.   predniSONE 10 MG tablet Commonly known as:  DELTASONE Take 1 tablet (10 mg total) by mouth daily with breakfast. Take 3 tablets for 2 days, 2 tablets for 2 days, 1 tablet for 2 days and then stop.   PROAIR HFA 108 (90 Base) MCG/ACT inhaler Generic drug:  albuterol Inhale 1 puff into the lungs every 6 (six) hours as needed for wheezing or shortness of breath.   risperidone 4 MG tablet Commonly known as:  RISPERDAL Take 4 mg by mouth 2 (two) times daily.            Durable Medical Equipment        Start     Ordered   05/29/16 0000  For home use only DME oxygen    Question Answer Comment  Mode or (Route) Nasal cannula   Liters per Minute 2   Frequency Continuous (stationary and portable oxygen unit needed)   Oxygen conserving device Yes   Oxygen delivery system Gas       05/29/16 1233     Follow-up Information    KILPATRICK JR,GEORGE R, MD. Schedule an appointment as soon as possible for a visit in 1 week(s).   Specialty:  Pulmonary Disease Contact information: [place] [address] [phone]          No Known Allergies  Consultations: Patient was transferred from pulmonary critical care service   Procedures/Studies: initially required BiPAP  Subjective: Patient was seen and examined at bedside. He reported doing well. Denied headache, dizziness, nausea, vomiting, chest pain or shortness of breath.  Discharge Exam: Vitals:   05/29/16 0755 05/29/16 1103  BP: (!) 128/97 (!) 141/78  Pulse: 67 74  Resp: 17 18  Temp: 98.5 F (36.9 C) 97.3 F (36.3 C)  Vitals:   05/29/16 0500 05/29/16 0755 05/29/16 0835 05/29/16 1103  BP:  (!) 128/97  (!) 141/78  Pulse:  67  74  Resp:  17  18  Temp:  98.5 F (36.9 C)  97.3 F (36.3 C)  TempSrc:  Oral  Oral  SpO2:  95% 95% 99%  Weight: 63.3 kg (139 lb 8 oz)     Height:        General: Pt is alert, awake, not in acute distress Cardiovascular: RRR, S1/S2 +, no rubs, no gallops Respiratory: CTA bilaterally, no wheezing, no rhonchi Abdominal: Soft, NT, ND, bowel sounds + Extremities: no edema, no cyanosis    The results of significant diagnostics from this hospitalization (including imaging, microbiology, ancillary and laboratory) are listed below for reference.     Microbiology: Recent Results (from the past 240 hour(s))  Blood Culture (routine x 2)     Status: None (Preliminary result)   Collection Time: 05/25/16 12:21 AM  Result Value Ref Range Status   Specimen Description BLOOD RIGHT ANTECUBITAL  Final   Special Requests BOTTLES DRAWN AEROBIC ONLY 5CC  Final   Culture NO GROWTH 3 DAYS  Final   Report Status PENDING  Incomplete  Blood Culture (routine x 2)     Status: None (Preliminary result)   Collection Time: 05/25/16  3:31 AM  Result Value Ref Range Status    Specimen Description BLOOD RIGHT FOREARM  Final   Special Requests BOTTLES DRAWN AEROBIC AND ANAEROBIC 5CC EA  Final   Culture NO GROWTH 3 DAYS  Final   Report Status PENDING  Incomplete  MRSA PCR Screening     Status: None   Collection Time: 05/25/16  9:14 AM  Result Value Ref Range Status   MRSA by PCR NEGATIVE NEGATIVE Final    Comment:        The GeneXpert MRSA Assay (FDA approved for NASAL specimens only), is one component of a comprehensive MRSA colonization surveillance program. It is not intended to diagnose MRSA infection nor to guide or monitor treatment for MRSA infections.      Labs: BNP (last 3 results) No results for input(s): BNP in the last 8760 hours. Basic Metabolic Panel:  Recent Labs Lab 05/25/16 0015 05/25/16 1427 05/26/16 0427 05/27/16 0206 05/28/16 0353 05/29/16 0245  NA 128*  --  131* 132* 130* 129*  K 3.2*  --  4.9 3.8 3.6 3.6  CL 91*  --  100* 96* 91* 94*  CO2 24  --  23 28 28 28   GLUCOSE 174*  --  160* 123* 125* 109*  BUN 8  --  11 14 10 11   CREATININE 0.76  --  0.65 0.63 0.59* 0.59*  CALCIUM 9.1  --  8.2* 8.3* 8.3* 8.4*  MG  --  2.4 2.5* 2.0 2.1 2.1  PHOS  --  3.3 2.9 2.9 3.1 2.8   Liver Function Tests: No results for input(s): AST, ALT, ALKPHOS, BILITOT, PROT, ALBUMIN in the last 168 hours. No results for input(s): LIPASE, AMYLASE in the last 168 hours. No results for input(s): AMMONIA in the last 168 hours. CBC:  Recent Labs Lab 05/25/16 0015 05/25/16 1427 05/26/16 0427 05/27/16 0206 05/28/16 0353 05/29/16 0245  WBC 10.4 12.4* 14.7* 20.4* 15.7* 12.4*  NEUTROABS 7.4  --   --   --   --   --   HGB 16.0 13.0 14.0 12.9* 12.6* 13.5  HCT 44.5 38.7* 41.4 38.7* 37.5* 38.9*  MCV 86.1 86.4 86.4 86.0  86.0 85.3  PLT 242 211 217 210 232 219   Cardiac Enzymes:  Recent Labs Lab 05/27/16 0206  TROPONINI <0.03   BNP: Invalid input(s): POCBNP CBG:  Recent Labs Lab 05/28/16 2134 05/29/16 0051 05/29/16 0432 05/29/16 0756  05/29/16 1106  GLUCAP 113* 181* 110* 93 112*   D-Dimer No results for input(s): DDIMER in the last 72 hours. Hgb A1c No results for input(s): HGBA1C in the last 72 hours. Lipid Profile No results for input(s): CHOL, HDL, LDLCALC, TRIG, CHOLHDL, LDLDIRECT in the last 72 hours. Thyroid function studies No results for input(s): TSH, T4TOTAL, T3FREE, THYROIDAB in the last 72 hours.  Invalid input(s): FREET3 Anemia work up No results for input(s): VITAMINB12, FOLATE, FERRITIN, TIBC, IRON, RETICCTPCT in the last 72 hours. Urinalysis    Component Value Date/Time   COLORURINE AMBER (A) 05/25/2016 0554   APPEARANCEUR HAZY (A) 05/25/2016 0554   LABSPEC 1.009 05/25/2016 0554   PHURINE 6.0 05/25/2016 0554   GLUCOSEU 150 (A) 05/25/2016 0554   HGBUR NEGATIVE 05/25/2016 0554   [place] NEGATIVE 05/25/2016 0554   KETONESUR NEGATIVE 05/25/2016 0554   PROTEINUR NEGATIVE 05/25/2016 0554   UROBILINOGEN 1.0 10/27/2013 1308   NITRITE NEGATIVE 05/25/2016 0554   LEUKOCYTESUR NEGATIVE 05/25/2016 0554   Sepsis Labs Invalid input(s): PROCALCITONIN,  WBC,  LACTICIDVEN Microbiology Recent Results (from the past 240 hour(s))  Blood Culture (routine x 2)     Status: None (Preliminary result)   Collection Time: 05/25/16 12:21 AM  Result Value Ref Range Status   Specimen Description BLOOD RIGHT ANTECUBITAL  Final   Special Requests BOTTLES DRAWN AEROBIC ONLY 5CC  Final   Culture NO GROWTH 3 DAYS  Final   Report Status PENDING  Incomplete  Blood Culture (routine x 2)     Status: None (Preliminary result)   Collection Time: 05/25/16  3:31 AM  Result Value Ref Range Status   Specimen Description BLOOD RIGHT FOREARM  Final   Special Requests BOTTLES DRAWN AEROBIC AND ANAEROBIC 5CC EA  Final   Culture NO GROWTH 3 DAYS  Final   Report Status PENDING  Incomplete  MRSA PCR Screening     Status: None   Collection Time: 05/25/16  9:14 AM  Result Value Ref Range Status   MRSA by PCR NEGATIVE NEGATIVE  Final    Comment:        The GeneXpert MRSA Assay (FDA approved for NASAL specimens only), is one component of a comprehensive MRSA colonization surveillance program. It is not intended to diagnose MRSA infection nor to guide or monitor treatment for MRSA infections.      Time coordinating discharge: 25 minutes  SIGNED:   Rosita Fire, MD  [facility] 05/29/2016, 12:35 PM  If 7PM-7AM, please contact night-coverage [email or web address] Password [email or web address]

## 2016-05-29 NOTE — Evaluation (Signed)
Occupational Therapy Evaluation Patient Details Name: Blake Mcknight MRN: [id number] DOB: [birth year] Today's Date: 05/29/2016    History of Present Illness 61 y/o man with a history of longstanding tobacco abuse and severe COPD (FEV1 the 30s in 2015), schizophrenia, who presents with worsening respiratory failure; ETT 3/11-3/12 self extubated.     Clinical Impression   Pt reports he was independent with ADL PTA. Currently pt overall min guard for ADL and functional mobility. Pt impulsive with transfers and unsteady in standing. Educated on energy conservation and fall prevention. Pt planning to d/c home with 24/7 supervision from family. Recommending HHOT for follow up to maximize independence and safety with ADL and functional mobility upon return home. Pt would benefit from continued skilled OT to address established goals.    Follow Up Recommendations  Home health OT;Supervision/Assistance - 24 hour    Equipment Recommendations  Tub/shower seat    Recommendations for Other Services       Precautions / Restrictions Precautions Precautions: Fall Precaution Comments: LOB during gait during session, slightly impulsive Restrictions Weight Bearing Restrictions: No      Mobility Bed Mobility               General bed mobility comments: Pt OOB in chair upon arrival  Transfers Overall transfer level: Needs assistance Equipment used: None Transfers: Sit to/from Stand Sit to Stand: Min guard         General transfer comment: Min guard for safety; no physical assist required    Balance Overall balance assessment: Needs assistance Sitting-balance support: Feet supported;No upper extremity supported Sitting balance-Leahy Scale: Good     Standing balance support: No upper extremity supported;During functional activity Standing balance-Leahy Scale: Fair                              ADL Overall ADL's : Needs assistance/impaired Eating/Feeding:  Independent;Sitting   Grooming: Min guard;Standing   Upper Body Bathing: Supervision/ safety;Sitting   Lower Body Bathing: Min guard;Sit to/from stand   Upper Body Dressing : Supervision/safety;Sitting   Lower Body Dressing: Min guard;Sit to/from stand   Toilet Transfer: Min guard;Ambulation;Regular Toilet       Tub/ Banker: Min guard;Tub transfer;Ambulation   Functional mobility during ADLs: Min guard General ADL Comments: Mildly unsteady with transfers. Educated on energy conservation and fall Conservation officer, nature      Pertinent Vitals/Pain Pain Assessment: No/denies pain     Hand Dominance     Extremity/Trunk Assessment Upper Extremity Assessment Upper Extremity Assessment: Overall WFL for tasks assessed   Lower Extremity Assessment Lower Extremity Assessment: Defer to PT evaluation   Cervical / Trunk Assessment Cervical / Trunk Assessment: Normal   Communication Communication Communication: No difficulties   Cognition Arousal/Alertness: Awake/alert Behavior During Therapy: Impulsive Overall Cognitive Status: No family/caregiver present to determine baseline cognitive functioning                     General Comments       Exercises       Shoulder Instructions      Home Living Family/patient expects to be discharged to:: Private residence Living Arrangements: Other relatives (mother) Available Help at Discharge: Family;Available 24 hours/day Type of Home: House Home Access: Stairs to enter [employer] of Steps: 2 Entrance Stairs-Rails: None Home Layout: One level  Bathroom Shower/Tub: Tub/shower unit;Curtain Shower/tub characteristics: Scientist, water quality: None          Prior Functioning/Environment Level of Independence: Needs assistance  Gait / Transfers Assistance Needed: Ambulates without AD, pt and sister deny h/o falls ADL's /  Homemaking Assistance Needed: does his own showering and dressing, reports sister does all chores and cooking            OT Problem List: Decreased activity tolerance;Impaired balance (sitting and/or standing);Decreased safety awareness;Decreased knowledge of use of DME or AE      OT Treatment/Interventions: Self-care/ADL training;Energy conservation;DME and/or AE instruction;Therapeutic activities;Patient/family education;Balance training    OT Goals(Current goals can be found in the care plan section) Acute Rehab OT Goals Patient Stated Goal: return home OT Goal Formulation: With patient Time For Goal Achievement: 06/12/16 Potential to Achieve Goals: Good ADL Goals Pt Will Perform Lower Body Bathing: with modified independence;sit to/from stand Pt Will Perform Lower Body Dressing: with modified independence;sit to/from stand Pt Will Transfer to Toilet: with modified independence;ambulating;regular height toilet Pt Will Perform Toileting - Clothing Manipulation and hygiene: with modified independence;sit to/from stand Pt Will Perform Tub/Shower Transfer: Tub transfer;with modified independence;ambulating;shower seat  OT Frequency: Min 2X/week   Barriers to D/C:            Co-evaluation              End of Session Equipment Utilized During Treatment: Oxygen Nurse Communication: Mobility status;Other (comment) (IV out upon arrival)  Activity Tolerance: Patient tolerated treatment well Patient left: in chair;with call bell/phone within reach;with chair alarm set  OT Visit Diagnosis: Unsteadiness on feet (R26.81)                ADL either performed or assessed with clinical judgement  Time: 1003-1016 OT Time Calculation (min): 13 min Charges:  OT General Charges $OT Visit: 1 Procedure OT Evaluation $OT Eval Moderate Complexity: 1 Procedure G-Codes:     Margit Batte A. Ulice Brilliant, M.S., OTR/L Pager: [place] 05/29/2016, 1:37 PM

## 2016-05-29 NOTE — Progress Notes (Signed)
Physical Therapy Treatment Patient Details Name: Blake Mcknight MRN: [id number] DOB: Jan 17, 1956 Today's Date: 05/29/2016    History of Present Illness 61 y/o man with a history of longstanding tobacco abuse and severe COPD (FEV1 the 30s in 2015), schizophrenia, who presents with worsening respiratory failure; ETT 3/11-3/12 self extubated.      PT Comments    Pt appears to be at fall risk secondary to occasional LOB while in room and while ambulating in hall with staff.  Vitals stayed WNL on supplemental oxygen.  Placed chair pad for incr safety as he was slightly impulsive during session.  Will continue to follow patient while on this venue of care to progress mobility    Follow Up Recommendations  Home health PT;Supervision for mobility/OOB     Equipment Recommendations  None recommended by PT    Recommendations for Other Services       Precautions / Restrictions Precautions Precautions: Fall Precaution Comments: LOB during gait during session, slightly impulsive Restrictions Weight Bearing Restrictions: No    Mobility  Bed Mobility Overal bed mobility: Modified Independent             General bed mobility comments: supine to sit  Transfers Overall transfer level: Needs assistance Equipment used: None Transfers: Sit to/from Stand;Stand Pivot Transfers Sit to Stand: Supervision Stand pivot transfers: Supervision       General transfer comment: stood to urinate at commode supervision  Ambulation/Gait Ambulation/Gait assistance: Min guard Ambulation Distance (Feet): 350 Feet Assistive device: None Gait Pattern/deviations: Step-through pattern;Decreased stride length;Drifts right/left     General Gait Details: 3 LOB during gait-able to self correct without external support   Stairs            Wheelchair Mobility    Modified Rankin (Stroke Patients Only)       Balance Overall balance assessment: Needs assistance Sitting-balance support:  No upper extremity supported;Feet supported Sitting balance-Leahy Scale: Good     Standing balance support: No upper extremity supported Standing balance-Leahy Scale: Fair Standing balance comment: LOB while turning to leave bathroom, stepping strategy                    Cognition Arousal/Alertness: Awake/alert Behavior During Therapy: Impulsive Overall Cognitive Status: Within Functional Limits for tasks assessed                 General Comments: pt getting OOB prior to therapist instruction    Exercises      General Comments General comments (skin integrity, edema, etc.): 2 L supplemental oxygen-O2 sats 92%, DUE 3/4      Pertinent Vitals/Pain Pain Assessment: No/denies pain    Home Living                      Prior Function            PT Goals (current goals can now be found in the care plan section) Acute Rehab PT Goals Patient Stated Goal: go to bathroom PT Goal Formulation: With patient Time For Goal Achievement: 06/04/16 Potential to Achieve Goals: Good Progress towards PT goals: Progressing toward goals    Frequency    Min 3X/week      PT Plan Current plan remains appropriate    Co-evaluation             End of Session Equipment Utilized During Treatment: Oxygen;Gait belt Activity Tolerance: Patient tolerated treatment well Patient left: in chair;with call bell/phone within reach;with chair alarm set Nurse  Communication: Mobility status;Precautions PT Visit Diagnosis: Other abnormalities of gait and mobility (R26.89)     Time: 2890-2284 PT Time Calculation (min) (ACUTE ONLY): 40 min  Charges:  $Gait Training: 23-37 mins $Therapeutic Activity: 8-22 mins                    G CodesSuszanne Finch, PT [phone]  [place] 05/29/2016, 12:07 PM

## 2016-05-29 NOTE — Care Management Note (Signed)
Case Management Note  Patient Details  Name: Blake Mcknight MRN: [id number] Date of Birth: 06-Oct-1955  Subjective/Objective: Pt plan for d/c home with sister to her address. Agency List provided and pt chose  [facility] [place] up with [facility]. Referral was made and SOC to begin within 24-48 hours post d/c.                    Action/Plan: DME delivered by [facility].   Expected Discharge Date:  05/29/16               Expected Discharge Plan:  [place]  In-House Referral:  NA  Discharge planning Services  CM Consult  Post Acute Care Choice:  Home Health, Durable Medical Equipment Choice offered to:  Patient, Sibling  DME Arranged:  Oxygen, 3-N-1, Shower stool DME Agency:  [place]:  RN, PT, OT [facility] Agency:  [place]  Status of Service:  Completed, signed off  If discussed at [place] of Stay Meetings, dates discussed:    Additional Comments:  Bethena Roys, RN 05/29/2016, 4:23 PM

## 2016-05-29 NOTE — Progress Notes (Signed)
SATURATION QUALIFICATIONS: (This note is used to comply with regulatory documentation for home oxygen)  Patient Saturations on Room Air at Rest = 93%  Patient Saturations on Room Air while Ambulating = 84%  Patient Saturations on 2Liters of oxygen while Ambulating = 95%  Please briefly explain why patient needs home oxygen: pt's oxygen sats drop to 84 with ambulation of 110 ft. Pt's sats increased to 95% after about a minute and 2L of oxygen nasal canula.

## 2016-05-30 LAB — CULTURE, BLOOD (ROUTINE X 2)
CULTURE: NO GROWTH
Culture: NO GROWTH

## 2016-05-31 DIAGNOSIS — J439 Emphysema, unspecified: Secondary | ICD-10-CM | POA: Diagnosis not present

## 2016-05-31 DIAGNOSIS — J189 Pneumonia, unspecified organism: Secondary | ICD-10-CM | POA: Diagnosis not present

## 2016-06-01 DIAGNOSIS — J439 Emphysema, unspecified: Secondary | ICD-10-CM | POA: Diagnosis not present

## 2016-06-01 DIAGNOSIS — J189 Pneumonia, unspecified organism: Secondary | ICD-10-CM | POA: Diagnosis not present

## 2016-06-02 DIAGNOSIS — K267 Chronic duodenal ulcer without hemorrhage or perforation: Secondary | ICD-10-CM | POA: Diagnosis not present

## 2016-06-02 DIAGNOSIS — Z79891 Long term (current) use of opiate analgesic: Secondary | ICD-10-CM | POA: Diagnosis not present

## 2016-06-02 DIAGNOSIS — J441 Chronic obstructive pulmonary disease with (acute) exacerbation: Secondary | ICD-10-CM | POA: Diagnosis not present

## 2016-06-02 DIAGNOSIS — Z79899 Other long term (current) drug therapy: Secondary | ICD-10-CM | POA: Diagnosis not present

## 2016-06-02 DIAGNOSIS — M159 Polyosteoarthritis, unspecified: Secondary | ICD-10-CM | POA: Diagnosis not present

## 2016-06-02 DIAGNOSIS — M2550 Pain in unspecified joint: Secondary | ICD-10-CM | POA: Diagnosis not present

## 2016-06-02 DIAGNOSIS — D751 Secondary polycythemia: Secondary | ICD-10-CM | POA: Diagnosis not present

## 2016-06-02 DIAGNOSIS — E7800 Pure hypercholesterolemia, unspecified: Secondary | ICD-10-CM | POA: Diagnosis not present

## 2016-06-02 DIAGNOSIS — F319 Bipolar disorder, unspecified: Secondary | ICD-10-CM | POA: Diagnosis not present

## 2016-06-03 DIAGNOSIS — J441 Chronic obstructive pulmonary disease with (acute) exacerbation: Secondary | ICD-10-CM | POA: Diagnosis not present

## 2016-06-03 DIAGNOSIS — J439 Emphysema, unspecified: Secondary | ICD-10-CM | POA: Diagnosis not present

## 2016-06-03 DIAGNOSIS — J189 Pneumonia, unspecified organism: Secondary | ICD-10-CM | POA: Diagnosis not present

## 2016-06-09 DIAGNOSIS — J439 Emphysema, unspecified: Secondary | ICD-10-CM | POA: Diagnosis not present

## 2016-06-09 DIAGNOSIS — J189 Pneumonia, unspecified organism: Secondary | ICD-10-CM | POA: Diagnosis not present

## 2016-06-10 DIAGNOSIS — J189 Pneumonia, unspecified organism: Secondary | ICD-10-CM | POA: Diagnosis not present

## 2016-06-10 DIAGNOSIS — J439 Emphysema, unspecified: Secondary | ICD-10-CM | POA: Diagnosis not present

## 2016-06-11 DIAGNOSIS — J189 Pneumonia, unspecified organism: Secondary | ICD-10-CM | POA: Diagnosis not present

## 2016-06-11 DIAGNOSIS — J439 Emphysema, unspecified: Secondary | ICD-10-CM | POA: Diagnosis not present

## 2016-06-12 DIAGNOSIS — J189 Pneumonia, unspecified organism: Secondary | ICD-10-CM | POA: Diagnosis not present

## 2016-06-12 DIAGNOSIS — J439 Emphysema, unspecified: Secondary | ICD-10-CM | POA: Diagnosis not present

## 2016-06-15 ENCOUNTER — Inpatient Hospital Stay (HOSPITAL_COMMUNITY)
Admission: EM | Admit: 2016-06-15 | Discharge: 2016-06-18 | DRG: 091 | Disposition: A | Discharge status: Home / Self Care | Payer: Medicare Other | Attending: Internal Medicine | Admitting: Internal Medicine

## 2016-06-15 ENCOUNTER — Emergency Department (HOSPITAL_COMMUNITY): Payer: Medicare Other

## 2016-06-15 ENCOUNTER — Encounter (HOSPITAL_COMMUNITY): Payer: Self-pay | Admitting: Emergency Medicine

## 2016-06-15 DIAGNOSIS — T43595A Adverse effect of other antipsychotics and neuroleptics, initial encounter: Secondary | ICD-10-CM | POA: Diagnosis not present

## 2016-06-15 DIAGNOSIS — I10 Essential (primary) hypertension: Secondary | ICD-10-CM | POA: Diagnosis present

## 2016-06-15 DIAGNOSIS — G92 Toxic encephalopathy: Principal | ICD-10-CM | POA: Diagnosis present

## 2016-06-15 DIAGNOSIS — R069 Unspecified abnormalities of breathing: Secondary | ICD-10-CM | POA: Diagnosis not present

## 2016-06-15 DIAGNOSIS — J9601 Acute respiratory failure with hypoxia: Secondary | ICD-10-CM | POA: Diagnosis not present

## 2016-06-15 DIAGNOSIS — T402X5A Adverse effect of other opioids, initial encounter: Secondary | ICD-10-CM | POA: Diagnosis present

## 2016-06-15 DIAGNOSIS — F209 Schizophrenia, unspecified: Secondary | ICD-10-CM | POA: Diagnosis present

## 2016-06-15 DIAGNOSIS — R4182 Altered mental status, unspecified: Secondary | ICD-10-CM

## 2016-06-15 DIAGNOSIS — E8729 Other acidosis: Secondary | ICD-10-CM

## 2016-06-15 DIAGNOSIS — F17210 Nicotine dependence, cigarettes, uncomplicated: Secondary | ICD-10-CM | POA: Diagnosis not present

## 2016-06-15 DIAGNOSIS — E785 Hyperlipidemia, unspecified: Secondary | ICD-10-CM | POA: Diagnosis present

## 2016-06-15 DIAGNOSIS — E872 Acidosis: Secondary | ICD-10-CM | POA: Diagnosis present

## 2016-06-15 DIAGNOSIS — W06XXXA Fall from bed, initial encounter: Secondary | ICD-10-CM | POA: Diagnosis present

## 2016-06-15 DIAGNOSIS — G9340 Encephalopathy, unspecified: Secondary | ICD-10-CM | POA: Diagnosis present

## 2016-06-15 DIAGNOSIS — G4700 Insomnia, unspecified: Secondary | ICD-10-CM | POA: Diagnosis present

## 2016-06-15 DIAGNOSIS — Z79899 Other long term (current) drug therapy: Secondary | ICD-10-CM

## 2016-06-15 DIAGNOSIS — R918 Other nonspecific abnormal finding of lung field: Secondary | ICD-10-CM | POA: Diagnosis not present

## 2016-06-15 DIAGNOSIS — J449 Chronic obstructive pulmonary disease, unspecified: Secondary | ICD-10-CM | POA: Diagnosis present

## 2016-06-15 DIAGNOSIS — Z79891 Long term (current) use of opiate analgesic: Secondary | ICD-10-CM

## 2016-06-15 DIAGNOSIS — S0990XA Unspecified injury of head, initial encounter: Secondary | ICD-10-CM | POA: Diagnosis not present

## 2016-06-15 DIAGNOSIS — Z7951 Long term (current) use of inhaled steroids: Secondary | ICD-10-CM

## 2016-06-15 DIAGNOSIS — J441 Chronic obstructive pulmonary disease with (acute) exacerbation: Secondary | ICD-10-CM | POA: Diagnosis not present

## 2016-06-15 DIAGNOSIS — J9602 Acute respiratory failure with hypercapnia: Secondary | ICD-10-CM | POA: Diagnosis present

## 2016-06-15 DIAGNOSIS — Z833 Family history of diabetes mellitus: Secondary | ICD-10-CM

## 2016-06-15 DIAGNOSIS — Z8249 Family history of ischemic heart disease and other diseases of the circulatory system: Secondary | ICD-10-CM

## 2016-06-15 LAB — I-STAT ARTERIAL BLOOD GAS, ED
ACID-BASE DEFICIT: 1 mmol/L (ref 0.0–2.0)
Acid-Base Excess: 1 mmol/L (ref 0.0–2.0)
BICARBONATE: 26.6 mmol/L (ref 20.0–28.0)
BICARBONATE: 27.9 mmol/L (ref 20.0–28.0)
O2 SAT: 96 %
O2 SAT: 96 %
PCO2 ART: 51 mmHg — AB (ref 32.0–48.0)
PCO2 ART: 51.7 mmHg — AB (ref 32.0–48.0)
PO2 ART: 84 mmHg (ref 83.0–108.0)
PO2 ART: 86 mmHg (ref 83.0–108.0)
Patient temperature: 97.7
Patient temperature: 97.5
TCO2: 28 mmol/L (ref 0–100)
TCO2: 30 mmol/L (ref 0–100)
pH, Arterial: 7.322 — ABNORMAL LOW (ref 7.350–7.450)
pH, Arterial: 7.338 — ABNORMAL LOW (ref 7.350–7.450)

## 2016-06-15 LAB — BASIC METABOLIC PANEL
Anion gap: 11 (ref 5–15)
CALCIUM: 9.1 mg/dL (ref 8.9–10.3)
CHLORIDE: 96 mmol/L — AB (ref 101–111)
CO2: 28 mmol/L (ref 22–32)
CREATININE: 0.72 mg/dL (ref 0.61–1.24)
GFR calc non Af Amer: >60 mL/min (ref >60)
Glucose, Bld: 109 mg/dL — ABNORMAL HIGH (ref 65–99)
Potassium: 3.8 mmol/L (ref 3.5–5.1)
SODIUM: 135 mmol/L (ref 135–145)

## 2016-06-15 LAB — CBC
HEMATOCRIT: 44.3 % (ref 39.0–52.0)
HEMOGLOBIN: 14.9 g/dL (ref 13.0–17.0)
MCH: 29.6 pg (ref 26.0–34.0)
MCHC: 33.6 g/dL (ref 30.0–36.0)
MCV: 88.1 fL (ref 78.0–100.0)
Platelets: 191 10*3/uL (ref 150–400)
RBC: 5.03 MIL/uL (ref 4.22–5.81)
RDW: 15.3 % (ref 11.5–15.5)
WBC: 6 10*3/uL (ref 4.0–10.5)

## 2016-06-15 LAB — I-STAT TROPONIN, ED: Troponin i, poc: 0 ng/mL (ref 0.00–0.08)

## 2016-06-15 LAB — I-STAT CG4 LACTIC ACID, ED: LACTIC ACID, VENOUS: 1.8 mmol/L (ref 0.5–1.9)

## 2016-06-15 LAB — AMMONIA: Ammonia: 26 umol/L (ref 9–35)

## 2016-06-15 LAB — BRAIN NATRIURETIC PEPTIDE: B Natriuretic Peptide: 9.5 pg/mL (ref 0.0–100.0)

## 2016-06-15 MED ORDER — VANCOMYCIN HCL IN DEXTROSE 1-5 GM/200ML-% IV SOLN
1000.0000 mg | Freq: Once | INTRAVENOUS | Status: AC
  Administered 2016-06-15: 1000 mg via INTRAVENOUS
  Filled 2016-06-15: qty 200

## 2016-06-15 MED ORDER — DEXTROSE 5 % IV SOLN
1.0000 g | Freq: Three times a day (TID) | INTRAVENOUS | Status: DC
  Administered 2016-06-16: 1 g via INTRAVENOUS
  Filled 2016-06-15 (×2): qty 1

## 2016-06-15 MED ORDER — DEXTROSE 5 % IV SOLN
2.0000 g | Freq: Once | INTRAVENOUS | Status: AC
  Administered 2016-06-15: 2 g via INTRAVENOUS
  Filled 2016-06-15: qty 2

## 2016-06-15 MED ORDER — VANCOMYCIN HCL IN DEXTROSE 1-5 GM/200ML-% IV SOLN
1000.0000 mg | Freq: Two times a day (BID) | INTRAVENOUS | Status: DC
  Administered 2016-06-16: 1000 mg via INTRAVENOUS
  Filled 2016-06-15: qty 200

## 2016-06-15 MED ORDER — METHYLPREDNISOLONE SODIUM SUCC 125 MG IJ SOLR
125.0000 mg | Freq: Once | INTRAMUSCULAR | Status: AC
  Administered 2016-06-15: 125 mg via INTRAVENOUS
  Filled 2016-06-15: qty 2

## 2016-06-15 NOTE — Progress Notes (Signed)
Pt is on NIV at this time per MD Tomi Bamberger verbal orders

## 2016-06-15 NOTE — Progress Notes (Signed)
Pharmacy Antibiotic Note Blake Mcknight is a 61 y.o. male admitted on 06/15/2016 with pneumonia.  Pharmacy has been consulted for Cefepime and vancomycin dosing.  Plan: Vancomycin 1000 IV every 12 hours.  Goal trough 15-20 mcg/mL.  Cefepime 1 gram IV every 12 hours   Height: 5\' 8"  (172.7 cm) Weight: 139 lb (63 kg) IBW/kg (Calculated) : 68.4  Temp (24hrs), Avg:97.7 F (36.5 C), Min:97.7 F (36.5 C), Max:97.7 F (36.5 C)   Recent Labs Lab 06/15/16 2117 06/15/16 2137  WBC 6.0  --   CREATININE 0.72  --   LATICACIDVEN  --  1.80    Estimated Creatinine Clearance: 89.8 mL/min (by C-G formula based on SCr of 0.72 mg/dL).    No Known Allergies   Thank you for allowing pharmacy to be a part of this patient's care.  Duayne Cal 06/15/2016 10:11 PM

## 2016-06-15 NOTE — ED Provider Notes (Signed)
[facility] DEPT Provider Note   CSN: [id number] Arrival date & time: 06/15/16  2030     History   Chief Complaint Chief Complaint  Patient presents with  . Altered Mental Status  . Respiratory Distress    HPI YUUKI SKEENS is a 61 y.o. male.  HPI Patient presents to the emergency room with altered mental status.  Patient has a history of severe respiratory disease. He was recently admitted to the hospital on March 11 for respiratory distress. He was intubated in the emergency room. Into the EMS report today the patient was found on the floor with altered mental status. He is noted to be wheezing by EMS. They gave him 10 of albuterol and 0.5 of Atrovent. He had a CBG of 103. In the emergency room the patient is very listless. It's hard to get any history from him. He seems to be having increased work of breathing Past Medical History:  Diagnosis Date  . COPD (chronic obstructive pulmonary disease) (HCC)    with bullous emphysema.   Marland Kitchen Heavy cigarette smoker before 2003   pt claims only 10 cigs per day, never heavier amounts.   Marland Kitchen HLD (hyperlipidemia)   . Hypertension   . Schizophrenia ([place]) 06/28/2013   This is a chronic condition and he lives with family    Patient Active Problem List   Diagnosis Date Noted  . Acute on chronic respiratory failure with hypoxia ([place]) 05/25/2016  . Altered mental status 09/22/2015  . Hyponatremia 09/22/2015  . Elevated lactic acid level 09/22/2015  . Acute encephalopathy   . Bullous emphysema ([place]) 07/26/2013  . Schizophrenia ([place]) 06/28/2013  . Tobacco use disorder 06/06/2013  . Abdominal pain 06/03/2013  . Sepsis ([place]) 06/03/2013  . Elevated LFTs 06/03/2013  . HTN (hypertension) 06/03/2013  . HLD (hyperlipidemia) 06/03/2013  . Chronic obstructive pulmonary disease with acute exacerbation ([place]) 06/03/2013  . Calculus of bile duct without mention of cholecystitis or obstruction 06/03/2013    Past Surgical History:  Procedure  Laterality Date  . CHOLECYSTECTOMY N/A 06/06/2013   Procedure: LAPAROSCOPIC CHOLECYSTECTOMY WITH ATTEMPTED INTRAOPERATIVE CHOLANGIOGRAM;  Surgeon: Zenovia Jarred, MD;  Location: [facility];  Service: General;  Laterality: N/A;  . ERCP N/A 06/03/2013   Procedure: ENDOSCOPIC RETROGRADE CHOLANGIOPANCREATOGRAPHY (ERCP);  Surgeon: Ladene Artist, MD;  Location: [facility] ENDOSCOPY;  Service: Endoscopy;  Laterality: N/A;  . NO PAST SURGERIES         Home Medications    Prior to Admission medications   Medication Sig Start Date End Date Taking? Authorizing Provider  ADVAIR HFA 115-21 MCG/ACT inhaler Inhale 1 puff into the lungs 2 (two) times daily. 09/21/15  Yes Historical Provider, MD  atorvastatin (LIPITOR) 10 MG tablet Take 10 mg by mouth daily at 6 PM.  09/21/15  Yes Historical Provider, MD  benztropine (COGENTIN) 2 MG tablet Take 2 mg by mouth 3 (three) times daily.  06/01/13  Yes Historical Provider, MD  cetirizine (ZYRTEC) 10 MG tablet Take 10 mg by mouth at bedtime.   Yes Historical Provider, MD  Cholecalciferol (VITAMIN D PO) Take 1 tablet by mouth daily as needed (occasional).   Yes Historical Provider, MD  Cyanocobalamin (B-12 PO) Take 1 tablet by mouth daily as needed (occasional).   Yes Historical Provider, MD  dextromethorphan-guaiFENesin (DIABETIC TUSSIN DM) 10-100 MG/5ML liquid Take 10 mLs by mouth every 4 (four) hours as needed for cough.   Yes Historical Provider, MD  enalapril (VASOTEC) 10 MG tablet Take 10 mg by mouth  daily. 06/04/16  Yes Historical Provider, MD  HYDROcodone-acetaminophen (NORCO) 7.5-325 MG tablet Take 1 tablet by mouth 4 (four) times daily.   Yes Historical Provider, MD  mirtazapine (REMERON) 30 MG tablet Take 30 mg by mouth at bedtime. 09/18/15  Yes Historical Provider, MD  Multiple Vitamin (MULTIVITAMIN WITH MINERALS) TABS tablet Take 1 tablet by mouth daily.   Yes Historical Provider, MD  PROAIR HFA 108 (90 BASE) MCG/ACT inhaler Inhale 1 puff into the lungs every 6 (six)  hours as needed for wheezing or shortness of breath.  05/12/13  Yes Historical Provider, MD  risperidone (RISPERDAL) 4 MG tablet Take 4 mg by mouth 2 (two) times daily. 09/18/15  Yes Historical Provider, MD    Family History Family History  Problem Relation Age of Onset  . Diabetes Mellitus II Mother   . Diabetes Mother   . Heart disease Father   . Stroke Maternal Aunt   . CAD Neg Hx     Social History Social History  Substance Use Topics  . Smoking status: Current Some Day Smoker    Packs/day: 1.00    Years: 10.00    Types: Cigarettes  . Smokeless tobacco: Former Systems developer    Types: Snuff  . Alcohol use No     Allergies   Patient has no known allergies.   Review of Systems Review of Systems  All other systems reviewed and are negative.    Physical Exam Updated Vital Signs BP 108/74 (BP Location: Left Arm)   Pulse 88   Temp 97.5 F (36.4 C) (Axillary)   Resp 17   Ht 5\' 8"  (1.727 m)   Wt 63 kg   SpO2 100%   BMI 21.13 kg/m   Physical Exam  Constitutional: He appears lethargic. He appears distressed.  HENT:  Head: Normocephalic and atraumatic.  Right Ear: External ear normal.  Left Ear: External ear normal.  Eyes: Conjunctivae are normal. Right eye exhibits no discharge. Left eye exhibits no discharge. No scleral icterus.  Neck: Neck supple. No tracheal deviation present.  Cardiovascular: Normal rate, regular rhythm and intact distal pulses.   Pulmonary/Chest: Accessory muscle usage present. No stridor. Tachypnea noted. No respiratory distress. He has wheezes. He has rales.  Abdominal: Soft. Bowel sounds are normal. He exhibits no distension. There is no tenderness. There is no rebound and no guarding.  Musculoskeletal: He exhibits no edema or tenderness.  Neurological: He appears lethargic. He displays no seizure activity.  Patient mumbles in response to my questions, I'm unable to get any significant history from him  Skin: Skin is warm and dry. No rash noted.  He is not diaphoretic.  Psychiatric: He has a normal mood and affect.  Nursing note and vitals reviewed.    ED Treatments / Results  Labs (all labs ordered are listed, but only abnormal results are displayed) Labs Reviewed  BASIC METABOLIC PANEL - Abnormal; Notable for the following:       Result Value   Chloride 96 (*)    Glucose, Bld 109 (*)    BUN <5 (*)    All other components within normal limits  I-STAT ARTERIAL BLOOD GAS, ED - Abnormal; Notable for the following:    pH, Arterial 7.322 (*)    pCO2 arterial 51.0 (*)    All other components within normal limits  I-STAT ARTERIAL BLOOD GAS, ED - Abnormal; Notable for the following:    pH, Arterial 7.338 (*)    pCO2 arterial 51.7 (*)    All  other components within normal limits  CULTURE, BLOOD (ROUTINE X 2)  CULTURE, BLOOD (ROUTINE X 2)  CBC  BRAIN NATRIURETIC PEPTIDE  AMMONIA  BASIC METABOLIC PANEL  I-STAT TROPOININ, ED  I-STAT CG4 LACTIC ACID, ED    EKG  EKG Interpretation  Date/Time:  Sunday June 15 2016 20:32:40 EDT Ventricular Rate:  114 PR Interval:    QRS Duration: 79 QT Interval:  311 QTC Calculation: 429 R Axis:   90 Text Interpretation:  Sinus tachycardia Ventricular premature complex Aberrant conduction of SV complex(es) Borderline right axis deviation Anteroseptal infarct, old Minimal ST depression, inferior leads Confirmed by Mirl Hillery  MD-J, Coda Mathey [phone]) on 06/15/2016 8:44:10 PM       Radiology Dg Chest Port 1 View  Result Date: 06/15/2016 CLINICAL DATA:  Altered mental status.  Found on floor. EXAM: PORTABLE CHEST 1 VIEW COMPARISON:  05/28/2016, 05/24/2016, 09/21/2015 FINDINGS: Extensive bullous emphysematous changes are again evident, with patchy lung base opacities bilaterally which may represent superimposed pneumonia or atelectasis. No pneumothorax. No large effusion. Normal heart size. IMPRESSION: Severe emphysematous disease. Patchy opacities in the lung bases may represent superimposed pneumonia or  atelectasis. Electronically Signed   By: Andreas Newport M.D.   On: 06/15/2016 21:28    Procedures .Critical Care Performed by: Dorie Rank Authorized by: Dorie Rank   Critical care provider statement:    Critical care time (minutes):  27   Critical care was time spent personally by me on the following activities:  Discussions with consultants, evaluation of patient's response to treatment, examination of patient, ordering and performing treatments and interventions, ordering and review of laboratory studies, ordering and review of radiographic studies, pulse oximetry, re-evaluation of patient's condition, obtaining history from patient or surrogate and review of old charts   (including critical care time)  Medications Ordered in ED Medications  ceFEPIme (MAXIPIME) 1 g in dextrose 5 % 50 mL IVPB (not administered)  vancomycin (VANCOCIN) IVPB 1000 mg/200 mL premix (not administered)  methylPREDNISolone sodium succinate (SOLU-MEDROL) 125 mg/2 mL injection 125 mg (125 mg Intravenous Given 06/15/16 2124)  ceFEPIme (MAXIPIME) 2 g in dextrose 5 % 50 mL IVPB (0 g Intravenous Stopped 06/15/16 2300)  vancomycin (VANCOCIN) IVPB 1000 mg/200 mL premix (0 mg Intravenous Stopped 06/16/16 0017)     Initial Impression / Assessment and Plan / ED Course  I have reviewed the triage vital signs and the nursing notes.  Pertinent labs & imaging results that were available during my care of the patient were reviewed by me and considered in my medical decision making (see chart for details).  Clinical Course as of Jun 17 18  Mon Jun 16, 2016  0006 Pt was not started on bipap after my initial order.  He clinically did improve with breathing treatments steroids.  Pt is more alert now but still not back to normal.  CT head ordered to evaluate for stroke, injury.  Bipap started after 2nd abg did not show any improvement.  [JK]    Clinical Course User Index [JK] Dorie Rank, MD    Pt presented to the ED with  change in mental status and respiratory distress.  Patient's respiratory distress is improved with treatment by EMS and in the emergency room. His mental status has slowly improved as well however he is not back to baseline.  Laboratory tests do show a small amount of respiratory acidosis. I'm not sure this degree would cause mental status changes. Chest x-ray suggests the possibility of pneumonia so he was started  on antibiotics of these findings could be just from atelectasis as well  Family members at the bedside states the patient is not back to baseline. Plan on CT scan of the brain to assess for any acute abnormalities. Otherwise plan on admission for COPD exacerbation.  Final Clinical Impressions(s) / ED Diagnoses   Final diagnoses:  COPD exacerbation ([employer])  Respiratory acidosis  Altered mental status, unspecified altered mental status type      Dorie Rank, MD 06/16/16 0020

## 2016-06-15 NOTE — ED Notes (Addendum)
Respiratory called for patient.  Per EDP, RT drawing another arterial gas.  If it has not improved, RT will be placing patient on BiPap.

## 2016-06-15 NOTE — ED Triage Notes (Signed)
Patient found by family on floor.  [place] out of bed.  Altered mental status. Disoriented.  No verbal.  Rhonchi throughout and expiratory wheezes.  10 albuterol and 0.5 atrovent given en route.  2L at home.  CBG 103, Temp 97.3, 100 HR, NSR, 100% NEB, 133/82, 16 RR. 18G RH.

## 2016-06-15 NOTE — ED Notes (Signed)
Respiratory therapy at bedside.

## 2016-06-15 NOTE — Progress Notes (Signed)
abg collected  

## 2016-06-16 ENCOUNTER — Emergency Department (HOSPITAL_COMMUNITY): Payer: Medicare Other

## 2016-06-16 DIAGNOSIS — T402X5A Adverse effect of other opioids, initial encounter: Secondary | ICD-10-CM | POA: Diagnosis present

## 2016-06-16 DIAGNOSIS — E872 Acidosis: Secondary | ICD-10-CM | POA: Diagnosis present

## 2016-06-16 DIAGNOSIS — J449 Chronic obstructive pulmonary disease, unspecified: Secondary | ICD-10-CM | POA: Diagnosis present

## 2016-06-16 DIAGNOSIS — Z79899 Other long term (current) drug therapy: Secondary | ICD-10-CM | POA: Diagnosis not present

## 2016-06-16 DIAGNOSIS — J9602 Acute respiratory failure with hypercapnia: Secondary | ICD-10-CM | POA: Diagnosis not present

## 2016-06-16 DIAGNOSIS — F209 Schizophrenia, unspecified: Secondary | ICD-10-CM | POA: Diagnosis present

## 2016-06-16 DIAGNOSIS — Z8249 Family history of ischemic heart disease and other diseases of the circulatory system: Secondary | ICD-10-CM | POA: Diagnosis not present

## 2016-06-16 DIAGNOSIS — G92 Toxic encephalopathy: Secondary | ICD-10-CM | POA: Diagnosis present

## 2016-06-16 DIAGNOSIS — R4182 Altered mental status, unspecified: Secondary | ICD-10-CM

## 2016-06-16 DIAGNOSIS — Z79891 Long term (current) use of opiate analgesic: Secondary | ICD-10-CM | POA: Diagnosis not present

## 2016-06-16 DIAGNOSIS — J441 Chronic obstructive pulmonary disease with (acute) exacerbation: Secondary | ICD-10-CM | POA: Diagnosis not present

## 2016-06-16 DIAGNOSIS — E785 Hyperlipidemia, unspecified: Secondary | ICD-10-CM | POA: Diagnosis present

## 2016-06-16 DIAGNOSIS — J9601 Acute respiratory failure with hypoxia: Secondary | ICD-10-CM | POA: Diagnosis present

## 2016-06-16 DIAGNOSIS — Z7951 Long term (current) use of inhaled steroids: Secondary | ICD-10-CM | POA: Diagnosis not present

## 2016-06-16 DIAGNOSIS — W06XXXA Fall from bed, initial encounter: Secondary | ICD-10-CM | POA: Diagnosis present

## 2016-06-16 DIAGNOSIS — G9340 Encephalopathy, unspecified: Secondary | ICD-10-CM | POA: Diagnosis not present

## 2016-06-16 DIAGNOSIS — G4700 Insomnia, unspecified: Secondary | ICD-10-CM | POA: Diagnosis present

## 2016-06-16 DIAGNOSIS — T43595A Adverse effect of other antipsychotics and neuroleptics, initial encounter: Secondary | ICD-10-CM | POA: Diagnosis present

## 2016-06-16 DIAGNOSIS — F17210 Nicotine dependence, cigarettes, uncomplicated: Secondary | ICD-10-CM | POA: Diagnosis present

## 2016-06-16 DIAGNOSIS — S0990XA Unspecified injury of head, initial encounter: Secondary | ICD-10-CM | POA: Diagnosis not present

## 2016-06-16 DIAGNOSIS — I10 Essential (primary) hypertension: Secondary | ICD-10-CM | POA: Diagnosis present

## 2016-06-16 DIAGNOSIS — Z833 Family history of diabetes mellitus: Secondary | ICD-10-CM | POA: Diagnosis not present

## 2016-06-16 LAB — BASIC METABOLIC PANEL
Anion gap: 12 (ref 5–15)
CHLORIDE: 98 mmol/L — AB (ref 101–111)
CO2: 25 mmol/L (ref 22–32)
Calcium: 9.3 mg/dL (ref 8.9–10.3)
Creatinine, Ser: 0.78 mg/dL (ref 0.61–1.24)
GFR calc Af Amer: >60 mL/min (ref >60)
GFR calc non Af Amer: >60 mL/min (ref >60)
GLUCOSE: 137 mg/dL — AB (ref 65–99)
Potassium: 4.5 mmol/L (ref 3.5–5.1)
Sodium: 135 mmol/L (ref 135–145)

## 2016-06-16 LAB — RAPID URINE DRUG SCREEN, HOSP PERFORMED
AMPHETAMINES: NOT DETECTED
BARBITURATES: NOT DETECTED
BENZODIAZEPINES: POSITIVE — AB
Cocaine: NOT DETECTED
Opiates: NOT DETECTED
Tetrahydrocannabinol: NOT DETECTED

## 2016-06-16 LAB — ETHANOL

## 2016-06-16 MED ORDER — METHYLPREDNISOLONE SODIUM SUCC 125 MG IJ SOLR
60.0000 mg | Freq: Three times a day (TID) | INTRAMUSCULAR | Status: DC
  Administered 2016-06-16 – 2016-06-17 (×5): 60 mg via INTRAVENOUS
  Filled 2016-06-16 (×5): qty 2

## 2016-06-16 MED ORDER — ENOXAPARIN SODIUM 40 MG/0.4ML ~~LOC~~ SOLN
40.0000 mg | Freq: Every day | SUBCUTANEOUS | Status: DC
  Administered 2016-06-16 – 2016-06-18 (×3): 40 mg via SUBCUTANEOUS
  Filled 2016-06-16 (×4): qty 0.4

## 2016-06-16 MED ORDER — BENZTROPINE MESYLATE 2 MG PO TABS
2.0000 mg | ORAL_TABLET | Freq: Three times a day (TID) | ORAL | Status: DC
  Administered 2016-06-16 – 2016-06-18 (×7): 2 mg via ORAL
  Filled 2016-06-16 (×8): qty 1

## 2016-06-16 MED ORDER — MIRTAZAPINE 15 MG PO TABS
30.0000 mg | ORAL_TABLET | Freq: Every day | ORAL | Status: DC
  Administered 2016-06-16 – 2016-06-17 (×2): 30 mg via ORAL
  Filled 2016-06-16 (×2): qty 2

## 2016-06-16 MED ORDER — ARFORMOTEROL TARTRATE 15 MCG/2ML IN NEBU
15.0000 ug | INHALATION_SOLUTION | Freq: Two times a day (BID) | RESPIRATORY_TRACT | Status: DC
  Administered 2016-06-16 – 2016-06-18 (×5): 15 ug via RESPIRATORY_TRACT
  Filled 2016-06-16 (×5): qty 2

## 2016-06-16 MED ORDER — ONDANSETRON HCL 4 MG/2ML IJ SOLN: 4.0000 mg | Freq: Four times a day (QID) | INTRAMUSCULAR | Status: DC | PRN

## 2016-06-16 MED ORDER — ATORVASTATIN CALCIUM 10 MG PO TABS
10.0000 mg | ORAL_TABLET | Freq: Every day | ORAL | Status: DC
  Administered 2016-06-16 – 2016-06-18 (×2): 10 mg via ORAL
  Filled 2016-06-16 (×2): qty 1

## 2016-06-16 MED ORDER — ACETAMINOPHEN 650 MG RE SUPP: 650.0000 mg | Freq: Four times a day (QID) | RECTAL | Status: DC | PRN

## 2016-06-16 MED ORDER — ONDANSETRON HCL 4 MG PO TABS
4.0000 mg | ORAL_TABLET | Freq: Four times a day (QID) | ORAL | Status: DC | PRN
  Administered 2016-06-18: 4 mg via ORAL
  Filled 2016-06-16: qty 1

## 2016-06-16 MED ORDER — SODIUM CHLORIDE 0.9 % IV SOLN
INTRAVENOUS | Status: DC
  Administered 2016-06-16 – 2016-06-17 (×2): via INTRAVENOUS
  Administered 2016-06-18: 1000 mL via INTRAVENOUS

## 2016-06-16 MED ORDER — TIOTROPIUM BROMIDE MONOHYDRATE 18 MCG IN CAPS
18.0000 ug | ORAL_CAPSULE | Freq: Every day | RESPIRATORY_TRACT | Status: DC
  Administered 2016-06-17 – 2016-06-18 (×2): 18 ug via RESPIRATORY_TRACT
  Filled 2016-06-16 (×2): qty 5

## 2016-06-16 MED ORDER — ALBUTEROL SULFATE (2.5 MG/3ML) 0.083% IN NEBU: 2.5000 mg | INHALATION_SOLUTION | RESPIRATORY_TRACT | Status: DC | PRN

## 2016-06-16 MED ORDER — IPRATROPIUM-ALBUTEROL 0.5-2.5 (3) MG/3ML IN SOLN
3.0000 mL | Freq: Four times a day (QID) | RESPIRATORY_TRACT | Status: DC
  Administered 2016-06-16 (×2): 3 mL via RESPIRATORY_TRACT
  Filled 2016-06-16 (×2): qty 3

## 2016-06-16 MED ORDER — ACETAMINOPHEN 325 MG PO TABS
650.0000 mg | ORAL_TABLET | Freq: Four times a day (QID) | ORAL | Status: DC | PRN
  Administered 2016-06-18: 650 mg via ORAL
  Filled 2016-06-16: qty 2

## 2016-06-16 MED ORDER — BUDESONIDE 0.5 MG/2ML IN SUSP
0.5000 mg | Freq: Two times a day (BID) | RESPIRATORY_TRACT | Status: DC
  Administered 2016-06-16 – 2016-06-18 (×5): 0.5 mg via RESPIRATORY_TRACT
  Filled 2016-06-16 (×5): qty 2

## 2016-06-16 NOTE — ED Notes (Signed)
Spoke with Admit MD they are to come and reevaluate pt and may downgrade pt's admission orders pt remains in room in no distress on 2Lnc

## 2016-06-16 NOTE — Progress Notes (Signed)
  Pt titrated to a 2L [place] tolerating it well no distress or complications noted.

## 2016-06-16 NOTE — ED Notes (Signed)
Merilyn Baba RN collected blood cultures. Chasity Wolfe EMT collected blood tubes.

## 2016-06-16 NOTE — H&P (Signed)
History and Physical    Blake Mcknight:[id number] DOB: Jan 17, 1956 DOA: 06/15/2016  Referring MD/NP/PA: Dr. Dina Rich PCP: Leola Brazil, MD  Patient coming from: Home  Chief Complaint:  Altered  HPI: Blake Mcknight is a 61 y.o. male with medical history significant of severe emphysema/COPD, schizophrenia , and tobacco abuse;  who presents from home after being noted to be acutely altered by his wife. History is obtained from the patient's wife as as the patient is  altered and unable. He had had a cough that was intermittently productive of phlegm. His wife tried to give him some Tussin cough medicine earlier in the day. However, as the day progressed he was more lethargic than normal, and she could not get him to respond to her questions appropriately. She checked his temperature, but noted that it was normal. The patient had been admitted from 3/10 through 3/15 for acute on chronic hypoxic respiratory failure requiring intubation and community-acquired pneumonia. ED Course: For admission to the emergency department patient was seen to be afebrile, pulse 86-113, respirations 17-35, blood pressure was as low as 90/56 . Lab work was relatively unremarkable. CT scan of the brain was negative for any acute abnormalities. Chest x-ray showing severe emphysema with patchy opacities in the bases. Given patient history he was started on empiric antibiotics of vancomycin and cefepime.  Review of Systems: As per HPI otherwise 10 point review of systems negative.   Past Medical History:  Diagnosis Date  . COPD (chronic obstructive pulmonary disease) (HCC)    with bullous emphysema.   Marland Kitchen Heavy cigarette smoker before 2003   pt claims only 10 cigs per day, never heavier amounts.   Marland Kitchen HLD (hyperlipidemia)   . Hypertension   . Schizophrenia ([place]) 06/28/2013   This is a chronic condition and he lives with family    Past Surgical History:  Procedure Laterality Date  . CHOLECYSTECTOMY N/A  06/06/2013   Procedure: LAPAROSCOPIC CHOLECYSTECTOMY WITH ATTEMPTED INTRAOPERATIVE CHOLANGIOGRAM;  Surgeon: Zenovia Jarred, MD;  Location: [place];  Service: General;  Laterality: N/A;  . ERCP N/A 06/03/2013   Procedure: ENDOSCOPIC RETROGRADE CHOLANGIOPANCREATOGRAPHY (ERCP);  Surgeon: Ladene Artist, MD;  Location: [facility] ENDOSCOPY;  Service: Endoscopy;  Laterality: N/A;  . NO PAST SURGERIES       reports that he has been smoking Cigarettes.  He has a 10.00 pack-year smoking history. He has quit using smokeless tobacco. His smokeless tobacco use included Snuff. He reports that he does not drink alcohol or use drugs.  No Known Allergies  Family History  Problem Relation Age of Onset  . Diabetes Mellitus II Mother   . Diabetes Mother   . Heart disease Father   . Stroke Maternal Aunt   . CAD Neg Hx     Prior to Admission medications   Medication Sig Start Date End Date Taking? Authorizing Provider  ADVAIR HFA 115-21 MCG/ACT inhaler Inhale 1 puff into the lungs 2 (two) times daily. 09/21/15  Yes Historical Provider, MD  atorvastatin (LIPITOR) 10 MG tablet Take 10 mg by mouth daily at 6 PM.  09/21/15  Yes Historical Provider, MD  benztropine (COGENTIN) 2 MG tablet Take 2 mg by mouth 3 (three) times daily.  06/01/13  Yes Historical Provider, MD  cetirizine (ZYRTEC) 10 MG tablet Take 10 mg by mouth at bedtime.   Yes Historical Provider, MD  Cholecalciferol (VITAMIN D PO) Take 1 tablet by mouth daily as needed (occasional).   Yes Historical Provider, MD  Cyanocobalamin (B-12 PO) Take 1 tablet by mouth daily as needed (occasional).   Yes Historical Provider, MD  dextromethorphan-guaiFENesin (DIABETIC TUSSIN DM) 10-100 MG/5ML liquid Take 10 mLs by mouth every 4 (four) hours as needed for cough.   Yes Historical Provider, MD  enalapril (VASOTEC) 10 MG tablet Take 10 mg by mouth daily. 06/04/16  Yes Historical Provider, MD  HYDROcodone-acetaminophen (NORCO) 7.5-325 MG tablet Take 1 tablet by mouth 4 (four)  times daily.   Yes Historical Provider, MD  mirtazapine (REMERON) 30 MG tablet Take 30 mg by mouth at bedtime. 09/18/15  Yes Historical Provider, MD  Multiple Vitamin (MULTIVITAMIN WITH MINERALS) TABS tablet Take 1 tablet by mouth daily.   Yes Historical Provider, MD  PROAIR HFA 108 (90 BASE) MCG/ACT inhaler Inhale 1 puff into the lungs every 6 (six) hours as needed for wheezing or shortness of breath.  05/12/13  Yes Historical Provider, MD  risperidone (RISPERDAL) 4 MG tablet Take 4 mg by mouth 2 (two) times daily. 09/18/15  Yes Historical Provider, MD    Physical Exam:    Constitutional:Lethargic male who does not follow commands at this time and is minimally arousable with painful stimuli.  Vitals:   06/16/16 0230 06/16/16 0245 06/16/16 0301 06/16/16 0315  BP: 107/71 104/73 97/61 (!) 90/56  Pulse: 89 86 (!) 105 93  Resp: 17 19 (!) 22 (!) 21  Temp:      TempSrc:      SpO2: 96% 99% 98% 97%  Weight:      Height:       Eyes: PERRL, lids and conjunctivae normal ENMT: Mucous membranes are  dry. Posterior pharynx clear of any exudate or lesions. Neck: normal, supple, no masses, no thyromegaly Respiratory: Expiratory wheezes appreciated throughout both lung fields Cardiovascular: Regular rate and rhythm, no murmurs / rubs / gallops. No extremity edema. 2+ pedal pulses. No carotid bruits.  Abdomen: no tenderness, no masses palpated. No hepatosplenomegaly. Bowel sounds positive.  Musculoskeletal: no clubbing / cyanosis. No joint deformity upper and lower extremities. Good ROM, no contractures. Normal muscle tone.  Skin: no rashes, lesions, ulcers. No induration Neurologic: CN 2-12 grossly intact. Able to move all extremities Psychiatric: Lethargic cannot assess orientation.  Labs on Admission: I have personally reviewed following labs and imaging studies  CBC:  Recent Labs Lab 06/15/16 2117  WBC 6.0  HGB 14.9  HCT 44.3  MCV 88.1  PLT 119   Basic Metabolic Panel:  Recent Labs Lab  06/15/16 2117  NA 135  K 3.8  CL 96*  CO2 28  GLUCOSE 109*  BUN <5*  CREATININE 0.72  CALCIUM 9.1   GFR: Estimated Creatinine Clearance: 89.8 mL/min (by C-G formula based on SCr of 0.72 mg/dL). Liver Function Tests: No results for input(s): AST, ALT, ALKPHOS, BILITOT, PROT, ALBUMIN in the last 168 hours. No results for input(s): LIPASE, AMYLASE in the last 168 hours.  Recent Labs Lab 06/15/16 2117  AMMONIA 26   Coagulation Profile: No results for input(s): INR, PROTIME in the last 168 hours. Cardiac Enzymes: No results for input(s): CKTOTAL, CKMB, CKMBINDEX, TROPONINI in the last 168 hours. BNP (last 3 results) No results for input(s): PROBNP in the last 8760 hours. HbA1C: No results for input(s): HGBA1C in the last 72 hours. CBG: No results for input(s): GLUCAP in the last 168 hours. Lipid Profile: No results for input(s): CHOL, HDL, LDLCALC, TRIG, CHOLHDL, LDLDIRECT in the last 72 hours. Thyroid Function Tests: No results for input(s): TSH, T4TOTAL, FREET4, T3FREE,  THYROIDAB in the last 72 hours. Anemia Panel: No results for input(s): VITAMINB12, FOLATE, FERRITIN, TIBC, IRON, RETICCTPCT in the last 72 hours. Urine analysis:    Component Value Date/Time   COLORURINE AMBER (A) 05/25/2016 0554   APPEARANCEUR HAZY (A) 05/25/2016 0554   LABSPEC 1.009 05/25/2016 0554   PHURINE 6.0 05/25/2016 0554   GLUCOSEU 150 (A) 05/25/2016 0554   HGBUR NEGATIVE 05/25/2016 0554   BILIRUBINUR NEGATIVE 05/25/2016 0554   KETONESUR NEGATIVE 05/25/2016 0554   PROTEINUR NEGATIVE 05/25/2016 0554   UROBILINOGEN 1.0 10/27/2013 1308   NITRITE NEGATIVE 05/25/2016 0554   LEUKOCYTESUR NEGATIVE 05/25/2016 0554   Sepsis Labs: No results found for this or any previous visit (from the past 240 hour(s)).   Radiological Exams on Admission: Ct Head Wo Contrast  Result Date: 06/16/2016 CLINICAL DATA:  Found down on floor by family, fell out of bed. Altered mental status. History of hypertension,  hyperlipidemia and schizophrenia. EXAM: CT HEAD WITHOUT CONTRAST TECHNIQUE: Contiguous axial images were obtained from the base of the skull through the vertex without intravenous contrast. COMPARISON:  MRI of the head September 22, 2015 FINDINGS: BRAIN: No intraparenchymal hemorrhage, mass effect nor midline shift. The ventricles and sulci are normal. No acute large vascular territory infarcts. No abnormal extra-axial fluid collections. Basal cisterns are patent. VASCULAR: Trace calcific atherosclerosis of the carotid siphon. SKULL/SOFT TISSUES: No skull fracture. No significant soft tissue swelling. ORBITS/SINUSES: The included ocular globes and orbital contents are normal.The mastoid aircells and included paranasal sinuses are well-aerated. OTHER: None. IMPRESSION: Negative CT HEAD for age. Electronically Signed   By: Elon Alas M.D.   On: 06/16/2016 01:10   Dg Chest Port 1 View  Result Date: 06/15/2016 CLINICAL DATA:  Altered mental status.  Found on floor. EXAM: PORTABLE CHEST 1 VIEW COMPARISON:  05/28/2016, 05/24/2016, 09/21/2015 FINDINGS: Extensive bullous emphysematous changes are again evident, with patchy lung base opacities bilaterally which may represent superimposed pneumonia or atelectasis. No pneumothorax. No large effusion. Normal heart size. IMPRESSION: Severe emphysematous disease. Patchy opacities in the lung bases may represent superimposed pneumonia or atelectasis. Electronically Signed   By: Andreas Newport M.D.   On: 06/15/2016 21:28    EKG: Independently reviewed. Sinus tachycardia   Assessment/Plan Acute encephalopathy: Acute. Patient noted to be more lethargic and not responding normal at home. Question combination of hypercapnia plus minus medication - Admit to stepdown bed - NPO - Follow-up UDS - Hold sedating medications  Acute respiratory failure with hypercapnia with severe emphysema/COPD - Continue BiPAP, wean as tolerated - IV steriods - Duonebs  - Determine if  antibiotics for possible community-acquired pneumonia need to be continued  Hyperlipidemia - Restart atorvastatin when able   H/O schizophrenia - Restart home medications when able DVT prophylaxis: lovenox  Code Status: Full Family Communication: Discussed plan of care with the patient's wife  Disposition Plan: likely discharge home when medically stable  Consults called: none Admission status: Observation  Norval Morton MD [facility] Pager [phone]  If 7PM-7AM, please contact night-coverage [email or web address] Password [email or web address]  06/16/2016, 4:01 AM

## 2016-06-16 NOTE — Progress Notes (Signed)
[place] TEAM 1 - Stepdown/ICU TEAM  Blake Mcknight  ZGY:[id number] DOB: 1955/04/30 DOA: 06/15/2016 PCP: Leola Brazil, MD    Brief Narrative:  61 y.o. male with history of severe emphysema/COPD, schizophrenia , and tobacco abuse who presented from home after being noted to be acutely altered by his wife. He had a cough intermittently productive of phlegm, and as the day progressed he became increasingly lethargic. The patient was admitted 3/10 > 3/15 for acute on chronic hypoxic respiratory failure due to PNA requiring intubation.  In the ED CT brain was negative for acute abnormalities. CXR noted severe emphysema with patchy opacities in the bases.   Subjective: Pt is seen for a f/u visit.  He is feeling better, but remains somewhat sob, and also somewhat lethargic.  He is no longer requiring BIPAP>    Assessment & Plan:  Acute encephalopathy UDS + for benzo w/ pt does not have a Rx for benzos - discuss w/ him in detail when he back to baseline mental status   Acute hypoxic and hypercarbic resp failure   Severe COPD  Ongoing tobacco abuse  Schizophrenia  HLD  DVT prophylaxis: lovenox Code Status: FULL CODE Family Communication: spoke w/ wife at bedside  Disposition Plan: stable for admit to tele bed - no longer requiring BIPAP/SDU  Consultants:  none  Procedures: none  Antimicrobials:  Cefepime 4/1  Vanc 4/1   Objective: Blood pressure (!) 88/52, pulse (!) 106, temperature 97.5 F (36.4 C), temperature source Axillary, resp. rate (!) 24, height 5\' 8"  (1.727 m), weight 63 kg (139 lb), SpO2 100 %.  Intake/Output Summary (Last 24 hours) at 06/16/16 1202 Last data filed at 06/16/16 0849  Gross per 24 hour  Intake              100 ml  Output              550 ml  Net             -450 ml   Filed Weights   06/15/16 2039  Weight: 63 kg (139 lb)    Examination: Pt was seen for a f/u visit.    CBC:  Recent Labs Lab 06/15/16 2117  WBC 6.0  HGB  14.9  HCT 44.3  MCV 88.1  PLT 591   Basic Metabolic Panel:  Recent Labs Lab 06/15/16 2117 06/16/16 0441  NA 135 135  K 3.8 4.5  CL 96* 98*  CO2 28 25  GLUCOSE 109* 137*  BUN <5* <5*  CREATININE 0.72 0.78  CALCIUM 9.1 9.3   GFR: Estimated Creatinine Clearance: 89.8 mL/min (by C-G formula based on SCr of 0.78 mg/dL).  Liver Function Tests: No results for input(s): AST, ALT, ALKPHOS, BILITOT, PROT, ALBUMIN in the last 168 hours. No results for input(s): LIPASE, AMYLASE in the last 168 hours.  Recent Labs Lab 06/15/16 2117  AMMONIA 26    Scheduled Meds: . arformoterol  15 mcg Nebulization BID  . budesonide (PULMICORT) nebulizer solution  0.5 mg Nebulization BID  . enoxaparin (LOVENOX) injection  40 mg Subcutaneous Daily  . ipratropium-albuterol  3 mL Nebulization QID  . methylPREDNISolone (SOLU-MEDROL) injection  60 mg Intravenous Q8H   Continuous Infusions: . sodium chloride 100 mL/hr at 06/16/16 0604  . ceFEPime (MAXIPIME) IV Stopped (06/16/16 6384)  . vancomycin 1,000 mg (06/16/16 1137)     LOS: 0 days   Time spent: No Charge  Cherene Altes, MD [facility] Office  [phone] Pager -  Text Page per Shea Evans as per below:  On-Call/Text Page:      [email or web address]      password [email or web address]  If 7PM-7AM, please contact night-coverage [email or web address] Password [email or web address] 06/16/2016, 12:02 PM

## 2016-06-16 NOTE — Progress Notes (Signed)
Pt transported to/from CT without event. 

## 2016-06-17 LAB — COMPREHENSIVE METABOLIC PANEL
ALT: 31 U/L (ref 17–63)
ANION GAP: 10 (ref 5–15)
AST: 26 U/L (ref 15–41)
Albumin: 3.3 g/dL — ABNORMAL LOW (ref 3.5–5.0)
Alkaline Phosphatase: 48 U/L (ref 38–126)
BUN: 6 mg/dL (ref 6–20)
CHLORIDE: 100 mmol/L — AB (ref 101–111)
CO2: 25 mmol/L (ref 22–32)
Calcium: 8.9 mg/dL (ref 8.9–10.3)
Creatinine, Ser: 0.7 mg/dL (ref 0.61–1.24)
Glucose, Bld: 133 mg/dL — ABNORMAL HIGH (ref 65–99)
POTASSIUM: 4.4 mmol/L (ref 3.5–5.1)
SODIUM: 135 mmol/L (ref 135–145)
Total Bilirubin: 0.6 mg/dL (ref 0.3–1.2)
Total Protein: 6.3 g/dL — ABNORMAL LOW (ref 6.5–8.1)

## 2016-06-17 LAB — CBC
HCT: 40.7 % (ref 39.0–52.0)
Hemoglobin: 13.6 g/dL (ref 13.0–17.0)
MCH: 29.4 pg (ref 26.0–34.0)
MCHC: 33.4 g/dL (ref 30.0–36.0)
MCV: 88.1 fL (ref 78.0–100.0)
Platelets: 224 10*3/uL (ref 150–400)
RBC: 4.62 MIL/uL (ref 4.22–5.81)
RDW: 15.4 % (ref 11.5–15.5)
WBC: 11.1 10*3/uL — ABNORMAL HIGH (ref 4.0–10.5)

## 2016-06-17 MED ORDER — METHYLPREDNISOLONE SODIUM SUCC 125 MG IJ SOLR
60.0000 mg | Freq: Every day | INTRAMUSCULAR | Status: DC
  Administered 2016-06-18: 60 mg via INTRAVENOUS
  Filled 2016-06-17: qty 2

## 2016-06-17 MED ORDER — BENZONATATE 100 MG PO CAPS
100.0000 mg | ORAL_CAPSULE | Freq: Two times a day (BID) | ORAL | Status: DC | PRN
Start: 2016-06-17 — End: 2016-06-18
  Administered 2016-06-17 – 2016-06-18 (×2): 100 mg via ORAL
  Filled 2016-06-17 (×2): qty 1

## 2016-06-17 NOTE — Progress Notes (Signed)
[place] TEAM 1 - Stepdown/ICU TEAM  Blake Mcknight  [id number]:[id number] DOB: 1956-01-12 DOA: 06/15/2016 PCP: Leola Brazil, MD    Brief Narrative:  61 y.o. male with history of severe emphysema/COPD, schizophrenia , and tobacco abuse who presented from home after being noted to be acutely altered by his wife. He had a cough intermittently productive of phlegm, and as the day progressed he became increasingly lethargic. The patient was admitted 3/10 > 3/15 for acute on chronic hypoxic respiratory failure due to PNA requiring intubation.  In the ED CT brain was negative for acute abnormalities. CXR noted severe emphysema with patchy opacities in the bases.   Subjective: Pt is seen for a f/u visit.  He is feeling better, but remains somewhat sob, and also somewhat lethargic.  He is no longer requiring BIPAP>    Assessment & Plan:  Acute encephalopathy UDS + for benzo w/ pt does not have a Rx for benzos  - still confused unsure what his baseline is given history of schizophrenia  mental status  - CT of head was negative  Acute hypoxic and hypercarbic resp failure  -resolved  Severe COPD - continue current medication regimen. Will decrease solumedrol to 60 daily  Ongoing tobacco abuse  Schizophrenia  HLD - stable continue statin  DVT prophylaxis: lovenox Code Status: FULL CODE Family Communication: spoke w/ wife at bedside  Disposition Plan: stable for admit to tele bed - no longer requiring BIPAP/SDU  Consultants:  none  Procedures: none  Antimicrobials:  Cefepime 4/1  Vanc 4/1   Objective: Blood pressure 122/71, pulse 80, temperature 97.7 F (36.5 C), temperature source Oral, resp. rate 16, height 5\' 8"  (1.727 m), weight 67.6 kg (149 lb), SpO2 95 %.  Intake/Output Summary (Last 24 hours) at 06/17/16 1702 Last data filed at 06/17/16 1300  Gross per 24 hour  Intake          1075.83 ml  Output             1900 ml  Net          -824.17 ml   Filed Weights     06/15/16 2039 06/16/16 2129  Weight: 63 kg (139 lb) 67.6 kg (149 lb)    Examination: Gen: Pt in nad, alert and awake cv: s1 and s2 wnl, no rubs pulm: equal chest rise, no wheezes Abdomen: nd, nt Ext: no cyanosis, equal tone Psych: mood and affect appropriate Head: atraumatic normocephalic Skin: warm and dry Neuro: no facial asymmetry, moves extremities equally  CBC:  Recent Labs Lab 06/15/16 2117 06/17/16 0530  WBC 6.0 11.1*  HGB 14.9 13.6  HCT 44.3 40.7  MCV 88.1 88.1  PLT 191 701   Basic Metabolic Panel:  Recent Labs Lab 06/15/16 2117 06/16/16 0441 06/17/16 0530  NA 135 135 135  K 3.8 4.5 4.4  CL 96* 98* 100*  CO2 28 25 25   GLUCOSE 109* 137* 133*  BUN <5* <5* 6  CREATININE 0.72 0.78 0.70  CALCIUM 9.1 9.3 8.9   GFR: Estimated Creatinine Clearance: 96.2 mL/min (by C-G formula based on SCr of 0.7 mg/dL).  Liver Function Tests:  Recent Labs Lab 06/17/16 0530  AST 26  ALT 31  ALKPHOS 48  BILITOT 0.6  PROT 6.3*  ALBUMIN 3.3*   No results for input(s): LIPASE, AMYLASE in the last 168 hours.  Recent Labs Lab 06/15/16 2117  AMMONIA 26    Scheduled Meds: . arformoterol  15 mcg Nebulization BID  . atorvastatin  10 mg Oral q1800  . benztropine  2 mg Oral TID  . budesonide (PULMICORT) nebulizer solution  0.5 mg Nebulization BID  . enoxaparin (LOVENOX) injection  40 mg Subcutaneous Daily  . methylPREDNISolone (SOLU-MEDROL) injection  60 mg Intravenous Q8H  . mirtazapine  30 mg Oral QHS  . tiotropium  18 mcg Inhalation Daily   Continuous Infusions: . sodium chloride 50 mL/hr at 06/17/16 1033     LOS: 1 day   Time spent:  [place], [place]  [phone] Pager - Text Page per Shea Evans as per below:  On-Call/Text Page:      [email or web address]      password [email or web address]  If 7PM-7AM, please contact night-coverage [email or web address] Password [email or web address] 06/17/2016, 5:02 PM

## 2016-06-17 NOTE — Evaluation (Signed)
Physical Therapy Evaluation Patient Details Name: Blake Mcknight MRN: [id number] DOB: Mar 24, 1955 Today's Date: 06/17/2016   History of Present Illness  Blake Mcknight is a 61 y.o. male with medical history significant of severe emphysema/COPD, schizophrenia , and tobacco abuse;  who presents from home after being noted to be acutely altered by his wife.  Clinical Impression  Pt admitted with/for AMS.  Pt currently limited functionally due to the problems listed below.  (see problems list.)  Pt will benefit from PT to maximize function and safety to be able to get home safely with available assist of wife.     Follow Up Recommendations No PT follow up    Equipment Recommendations  None recommended by PT    Recommendations for Other Services       Precautions / Restrictions Precautions Precautions: Fall      Mobility  Bed Mobility Overal bed mobility: Modified Independent                Transfers Overall transfer level: Needs assistance Equipment used: None Transfers: Sit to/from Stand;Stand Pivot Transfers Sit to Stand: Supervision Stand pivot transfers: Supervision       General transfer comment: very safe with transfers  Ambulation/Gait Ambulation/Gait assistance: Supervision Ambulation Distance (Feet): 450 Feet Assistive device: None Gait Pattern/deviations: Step-through pattern   Gait velocity interpretation: Below normal speed for age/gender General Gait Details: generally steady, unable to change speed significantly  Stairs            Wheelchair Mobility    Modified Rankin (Stroke Patients Only)       Balance Overall balance assessment: Needs assistance   Sitting balance-Leahy Scale: Good     Standing balance support: No upper extremity supported Standing balance-Leahy Scale: Fair                   Standardized Balance Assessment Standardized Balance Assessment : Dynamic Gait Index   Dynamic Gait Index Level  Surface: Normal Change in Gait Speed: Moderate Impairment Gait with Horizontal Head Turns: Normal Gait with Vertical Head Turns: Normal Gait and Pivot Turn: Normal Step Over Obstacle: Mild Impairment Step Around Obstacles: Mild Impairment       Pertinent Vitals/Pain Pain Assessment: No/denies pain    Home Living Family/patient expects to be discharged to:: Private residence Living Arrangements: Other relatives Available Help at Discharge: Family;Available 24 hours/day Type of Home: House Home Access: Stairs to enter Entrance Stairs-Rails: None Entrance Stairs-Number of Steps: 2 Home Layout: One level Home Equipment: None      Prior Function     Gait / Transfers Assistance Needed: Ambulates without AD, pt and sister deny h/o falls  ADL's / Homemaking Assistance Needed: does his own showering and dressing, reports sister does all chores and cooking        Hand Dominance        Extremity/Trunk Assessment        Lower Extremity Assessment Lower Extremity Assessment: Overall WFL for tasks assessed (mild proximal weakness)       Communication   Communication: No difficulties  Cognition Arousal/Alertness: Awake/alert Behavior During Therapy: WFL for tasks assessed/performed Overall Cognitive Status: No family/caregiver present to determine baseline cognitive functioning                                        General Comments General comments (skin integrity, edema, etc.):  During gait on  RA, SpO2 initially 94% at 107 bpm;  later on RA sats dropped to 85% at 110's, but with quick return to 90/91% at rest.    Exercises     Assessment/Plan    PT Assessment Patient needs continued PT services  PT Problem List Decreased strength;Decreased activity tolerance;Decreased mobility       PT Treatment Interventions Gait training;Functional mobility training;Stair training;Therapeutic activities;Patient/family education    PT Goals (Current goals can  be found in the Care Plan section)  Acute Rehab PT Goals Patient Stated Goal: return home Time For Goal Achievement: 06/24/16 Potential to Achieve Goals: Good    Frequency Min 3X/week   Barriers to discharge        Co-evaluation               End of Session   Activity Tolerance: Patient tolerated treatment well Patient left: in chair;with call bell/phone within reach;with chair alarm set Nurse Communication: Mobility status PT Visit Diagnosis: Other abnormalities of gait and mobility (R26.89)    Time: 4301-4840 PT Time Calculation (min) (ACUTE ONLY): 26 min   Charges:   PT Evaluation $PT Eval Moderate Complexity: 1 Procedure PT Treatments $Gait Training: 8-22 mins   PT G Codes:        06-25-16  Donnella Sham, PT [phone]  (pager)  Tessie Fass Kline Bulthuis 06-25-2016, 12:09 PM

## 2016-06-17 NOTE — Evaluation (Signed)
Occupational Therapy Evaluation Patient Details Name: Blake Mcknight MRN: [id number] DOB: 07-05-1955 Today's Date: 06/17/2016    History of Present Illness Blake Mcknight is a 61 y.o. male with medical history significant of severe emphysema/COPD, schizophrenia , and tobacco abuse;  who presents from home after being noted to be acutely altered.   Clinical Impression   PTA, pt lived with his sister and was independent with 7 of ADLs with occasional assistance from his sister for bathing. Pt's sister reports that he was receiving HHOT, HHOT, HHRN PTA. Currently, pt performed ADLs and functional mobility with Min guard A for safety. Pt would benefit from skilled OT to facilitate safe dc home. Recommend dc home once medically stable per physician with continued HHOT to increase safety and independence in ADLs.     Follow Up Recommendations  Home health OT;Supervision/Assistance - 24 hour    Equipment Recommendations  None recommended by OT    Recommendations for Other Services       Precautions / Restrictions Precautions Precautions: Fall Restrictions Weight Bearing Restrictions: No      Mobility Bed Mobility               General bed mobility comments: In recliner upon arrival  Transfers Overall transfer level: Needs assistance Equipment used: None Transfers: Sit to/from [facility] Sit to Stand: Supervision Stand pivot transfers: Supervision       General transfer comment: very safe with transfers    Balance Overall balance assessment: Needs assistance   Sitting balance-Leahy Scale: Good Sitting balance - Comments: Don/dof socks with supervision in recliner   Standing balance support: No upper extremity supported Standing balance-Leahy Scale: Fair Standing balance comment: Able to stand at toilet for urination                           ADL either performed or assessed with clinical judgement   ADL Overall ADL's :  Needs assistance/impaired Eating/Feeding: Set up;Sitting   Grooming: Wash/dry hands;Min guard;Standing   Upper Body Bathing: Min guard;Sitting   Lower Body Bathing: Min guard;Sit to/from stand   Upper Body Dressing : Set up;Sitting   Lower Body Dressing: Min guard;Sit to/from stand  Don/dof socks with supervision seated in recliner   Toilet Transfer: Min guard;Ambulation;Regular Toilet           Functional mobility during ADLs: Min guard General ADL Comments: Pt performed toileting and grooming with Min guard for safety.      Vision         Perception     Praxis      Pertinent Vitals/Pain Pain Assessment: No/denies pain     Hand Dominance Right   Extremity/Trunk Assessment Upper Extremity Assessment Upper Extremity Assessment: Overall WFL for tasks assessed   Lower Extremity Assessment Lower Extremity Assessment: Overall WFL for tasks assessed   Cervical / Trunk Assessment Cervical / Trunk Assessment: Normal   Communication Communication Communication: No difficulties   Cognition Arousal/Alertness: Awake/alert Behavior During Therapy: WFL for tasks assessed/performed Overall Cognitive Status: History of cognitive impairments - at baseline                                 General Comments: Pt has history is schizophrenia and has a pseudobulbar affect with laughter. Pt oriented to self and place. Able to remember 3/3 words.   General Comments  Pt's cousin present durign session  and called pt's sister to confirm PLOF and BSC.  Pt's sister reports that he was receiving HHOT, HHOT, HHRN PTA.    Exercises     Shoulder Instructions      Home Living Family/patient expects to be discharged to:: Private residence Living Arrangements: Other relatives (Sister) Available Help at Discharge: Family;Available 24 hours/day Type of Home: House Home Access: Stairs to enter [employer] of Steps: 2 Entrance Stairs-Rails: None Home Layout:  One level     Bathroom Shower/Tub: Corporate investment banker: Standard     Home Equipment: Bedside commode          Prior Functioning/Environment Level of Independence: Needs assistance  Gait / Transfers Assistance Needed: Ambulates without AD ADL's / Homemaking Assistance Needed: Sister performs IADLs. Pt performed majority of ADLs with some A for bathing            OT Problem List: Decreased activity tolerance;Impaired balance (sitting and/or standing);Decreased safety awareness;Decreased knowledge of use of DME or AE      OT Treatment/Interventions: Self-care/ADL training;Energy conservation;DME and/or AE instruction;Therapeutic activities;Patient/family education;Balance training    OT Goals(Current goals can be found in the care plan section) Acute Rehab OT Goals Patient Stated Goal: return home OT Goal Formulation: With patient Time For Goal Achievement: 07/01/16 Potential to Achieve Goals: Good  OT Frequency: Min 2X/week   Barriers to D/C:            Co-evaluation              End of Session Nurse Communication: Mobility status;Other (comment) (IV disconnected on arrival)  Activity Tolerance: Patient tolerated treatment well Patient left: in chair;with call bell/phone within reach;with chair alarm set  OT Visit Diagnosis: Unsteadiness on feet (R26.81)                Time: 5974-1638 OT Time Calculation (min): 20 min Charges:  OT General Charges $OT Visit: 1 Procedure OT Evaluation $OT Eval Moderate Complexity: 1 Procedure G-Codes:     [place], OTR/L [phone]  [place] 06/17/2016, 4:48 PM

## 2016-06-18 DIAGNOSIS — J9602 Acute respiratory failure with hypercapnia: Secondary | ICD-10-CM

## 2016-06-18 DIAGNOSIS — J441 Chronic obstructive pulmonary disease with (acute) exacerbation: Secondary | ICD-10-CM

## 2016-06-18 DIAGNOSIS — G9340 Encephalopathy, unspecified: Secondary | ICD-10-CM

## 2016-06-18 MED ORDER — ACETAMINOPHEN 325 MG PO TABS
650.0000 mg | ORAL_TABLET | Freq: Four times a day (QID) | ORAL | Status: DC | PRN
Start: 2016-06-18 — End: 2019-06-07

## 2016-06-18 MED ORDER — PREDNISONE 10 MG PO TABS: ORAL_TABLET | ORAL | 0 refills | Status: DC

## 2016-06-18 NOTE — Discharge Instructions (Signed)
Acute Respiratory Failure, Adult °Acute respiratory failure occurs when there is not enough oxygen passing from your lungs to your body. When this happens, your lungs have trouble removing carbon dioxide from the blood. This causes your blood oxygen level to drop too low as carbon dioxide builds up. °Acute respiratory failure is a medical emergency. It can develop quickly, but it is temporary if treated promptly. Your lung capacity, or how much air your lungs can hold, may improve with time, exercise, and treatment. °What are the causes? °There are many possible causes of acute respiratory failure, including: °· Lung injury. °· Chest injury or damage to the ribs or tissues near the lungs. °· Lung conditions that affect the flow of air and blood into and out of the lungs, such as pneumonia, acute respiratory distress syndrome, and cystic fibrosis. °· Medical conditions, such as strokes or spinal cord injuries, that affect the muscles and nerves that control breathing. °· Blood infection (sepsis). °· Inflammation of the pancreas (pancreatitis). °· A blood clot in the lungs (pulmonary embolism). °· A large-volume blood transfusion. °· Burns. °· Near-drowning. °· Seizure. °· Smoke inhalation. °· Reaction to medicines. °· Alcohol or drug overdose. °What increases the risk? °This condition is more likely to develop in people who have: °· A blocked airway. °· Asthma. °· A condition or disease that damages or weakens the muscles, nerves, bones, or tissues that are involved in breathing. °· A serious infection. °· A health problem that blocks the unconscious reflex that is involved in breathing, such as hypothyroidism or sleep apnea. °· A lung injury or trauma. °What are the signs or symptoms? °Trouble breathing is the main symptom of acute respiratory failure. Symptoms may also include: °· Rapid breathing. °· Restlessness or anxiety. °· Skin, lips, or fingernails that appear blue (cyanosis). °· Rapid heart rate. °· Abnormal  heart rhythms (arrhythmias). °· Confusion or changes in behavior. °· Tiredness or loss of energy. °· Feeling sleepy or having a loss of consciousness. °How is this diagnosed? °Your health care provider can diagnose acute respiratory failure with a medical history and physical exam. During the exam, your health care provider will listen to your heart and check for crackling or wheezing sounds in your lungs. Your may also have tests to confirm the diagnosis and determine what is causing respiratory failure. These tests may include: °· Measuring the amount of oxygen in your blood (pulse oximetry). The measurement comes from a small device that is placed on your finger, earlobe, or toe. °· Other blood tests to measure blood gases and to look for signs of infection. °· Sampling your cerebral spinal fluid or tracheal fluid to check for infections. °· Chest X-ray to look for fluid in spaces that should be filled with air. °· Electrocardiogram (ECG) to look at the heart's electrical activity. °How is this treated? °Treatment for this condition usually takes places in a hospital intensive care unit (ICU). Treatment depends on what is causing the condition. It may include one or more treatments until your symptoms improve. Treatment may include: °· Supplemental oxygen. Extra oxygen is given through a tube in the nose, a face mask, or a hood. °· A device such as a continuous positive airway pressure (CPAP) or bi-level positive airway pressure (BiPAP or BPAP) machine. This treatment uses mild air pressure to keep the airways open. A mask or other device will be placed over your nose or mouth. A tube that is connected to a motor will deliver oxygen through the mask. °·   Ventilator. This treatment helps move air into and out of the lungs. This may be done with a bag and mask or a machine. For this treatment, a tube is placed in your windpipe (trachea) so air and oxygen can flow to the lungs.  Extracorporeal membrane oxygenation  (ECMO). This treatment temporarily takes over the function of the heart and lungs, supplying oxygen and removing carbon dioxide. ECMO gives the lungs a chance to recover. It may be used if a ventilator is not effective.  Tracheostomy. This is a procedure that creates a hole in the neck to insert a breathing tube.  Receiving fluids and medicines.  Rocking the bed to help breathing. Follow these instructions at home:  Take over-the-counter and prescription medicines only as told by your health care provider.  Return to normal activities as told by your health care provider. Ask your health care provider what activities are safe for you.  Keep all follow-up visits as told by your health care provider. This is important. How is this prevented? Treating infections and medical conditions that may lead to acute respiratory failure can help prevent the condition from developing. Contact a health care provider if:  You have a fever.  Your symptoms do not improve or they get worse. Get help right away if:  You are having trouble breathing.  You lose consciousness.  Your have cyanosis or turn blue.  You develop a rapid heart rate.  You are confused. These symptoms may represent a serious problem that is an emergency. Do not wait to see if the symptoms will go away. Get medical help right away. Call your local emergency services (911 in the [place] Do not drive yourself to the hospital. This information is not intended to replace advice given to you by your health care provider. Make sure you discuss any questions you have with your health care provider. Document Released: 03/08/2013 Document Revised: 09/29/2015 Document Reviewed: 09/19/2015 Elsevier Interactive Patient Education  2017 [place].   Additional discharge instructions:  Please get your medications reviewed and adjusted by your Primary MD.  Please request your Primary MD to go over all Hospital Tests and  Procedure/Radiological results at the follow up, please get all Hospital records sent to your Prim MD by signing hospital release before you go home.  If you had Pneumonia of Lung problems at the Hospital: Please get a 2 view Chest X ray done in 6-8 weeks after hospital discharge or sooner if instructed by your Primary MD.  If you have Congestive Heart Failure: Please call your Cardiologist or Primary MD anytime you have any of the following symptoms:  1) 3 pound weight gain in 24 hours or 5 pounds in 1 week  2) shortness of breath, with or without a dry hacking cough  3) swelling in the hands, feet or stomach  4) if you have to sleep on extra pillows at night in order to breathe  Follow cardiac low salt diet and 1.5 lit/day fluid restriction.  If you have diabetes Accuchecks 4 times/day, Once in AM empty stomach and then before each meal. Log in all results and show them to your primary doctor at your next visit. If any glucose reading is under 80 or above 300 call your primary MD immediately.  If you have Seizure/Convulsions/Epilepsy: Please do not drive, operate heavy machinery, participate in activities at heights or participate in high speed sports until you have seen by Primary MD or a Neurologist and advised to do so again.  If you had Gastrointestinal Bleeding: Please ask your Primary MD to check a complete blood count within one week of discharge or at your next visit. Your endoscopic/colonoscopic biopsies that are pending at the time of discharge, will also need to followed by your Primary MD.  Get Medicines reviewed and adjusted. Please take all your medications with you for your next visit with your Primary MD  Please request your Primary MD to go over all hospital tests and procedure/radiological results at the follow up, please ask your Primary MD to get all Hospital records sent to his/her office.  If you experience worsening of your admission symptoms, develop shortness  of breath, life threatening emergency, suicidal or homicidal thoughts you must seek medical attention immediately by calling 911 or calling your MD immediately  if symptoms less severe.  You must read complete instructions/literature along with all the possible adverse reactions/side effects for all the Medicines you take and that have been prescribed to you. Take any new Medicines after you have completely understood and accpet all the possible adverse reactions/side effects.   Do not drive or operate heavy machinery when taking Pain medications.   Do not take more than prescribed Pain, Sleep and Anxiety Medications  Special Instructions: If you have smoked or chewed Tobacco  in the last 2 yrs please stop smoking, stop any regular Alcohol  and or any Recreational drug use.  Wear Seat belts while driving.  Please note You were cared for by a hospitalist during your hospital stay. If you have any questions about your discharge medications or the care you received while you were in the hospital after you are discharged, you can call the unit and asked to speak with the hospitalist on call if the hospitalist that took care of you is not available. Once you are discharged, your primary care physician will handle any further medical issues. Please note that NO REFILLS for any discharge medications will be authorized once you are discharged, as it is imperative that you return to your primary care physician (or establish a relationship with a primary care physician if you do not have one) for your aftercare needs so that they can reassess your need for medications and monitor your lab values.  You can reach the hospitalist office at phone [phone] or fax [phone]   If you do not have a primary care physician, you can call [phone] for a physician referral.

## 2016-06-18 NOTE — Progress Notes (Signed)
Patient discharged to her sister,[name].Discharged education rendered with especial emphasis on his follow-up appointment and using of oxygen.She reminded that she needs to have apulse oximeter so that she could know when to use oxygen to the patient,advised that below 88%,he needs to have an oxygen.

## 2016-06-18 NOTE — Progress Notes (Signed)
Placed a call twice to his sister twiced,[name],where the patient is living in.Left a voicemail message to call me back.

## 2016-06-18 NOTE — Care Management Note (Signed)
Case Management Note  Patient Details  Name: Berkeley C Stemmer MRN: [id number] Date of Birth: 09/03/1955  Subjective/Objective:    CM following for progression and d/c planning.                 Action/Plan: 06/18/2016 Met with pt re d/c needs. This pt spoke with pt re psy followup and pt gave permission to schedule him for followup at the [facility] outpatient clinic. An appointment was scheduled and info placed in d/c instructions re appointmen for Wednesday July 02, 2016. Per pt he lives with his sister who does not work and he is able to assist with some household task.   Expected Discharge Date:  06/18/16               Expected Discharge Plan:  Home/Self Care  In-House Referral:  NA  Discharge planning Services  CM Consult, Follow-up appt scheduled  Post Acute Care Choice:  NA Choice offered to:  NA  DME Arranged:   NA DME Agency:   NA  HH Arranged:   NA HH Agency:   NA  Status of Service:  Completed, signed off  If discussed at Long Length of Stay Meetings, dates discussed:    Additional Comments:  ,  U, RN 06/18/2016, 2:57 PM  

## 2016-06-18 NOTE — Discharge Summary (Signed)
Physician Discharge Summary  Blake Mcknight TKW:[id number] DOB: 1955-08-23  PCP: Leola Brazil, MD  Admit date: 06/15/2016 Discharge date: 06/18/2016  Recommendations for Outpatient Follow-up:  1. Dr. Gracy Bruins, PCP in 5 days. [facility] outpatient clinic on 07/02/16 at 10 AM.  Home Health: None Equipment/Devices: None    Discharge Condition: Improved and stable  CODE STATUS: Full  Diet recommendation: Heart healthy diet.  Discharge Diagnoses:  Active Problems:   Acute encephalopathy   Acute respiratory failure with hypercapnia (HCC)   Acute respiratory failure with hypoxia [facility])   Brief Summary: 61 year old male, lives with his family, PMH of COPD, home oxygen 2 L/m via nasal cannula when necessary, former tobacco abuse, schizophrenia, HLD, HTN, presented from home after noted to be acutely altered by his family. History was obtained from his family due to his being altered and unable to provide history on admission. He had cough with intermittent phlegm production and his wife tried to give him some cough medicines earlier in the day. However as the day progressed, he became more lethargic than usual and they could not get him to respond to questions appropriately. No fevers. Prior hospitalization 3/10-3/15 for acute on chronic hypoxic respiratory failure requiring intubation and for community-acquired pneumonia. In the ED, afebrile, mildly tachycardic and tachypnea, mild hypotension, lab work unremarkable, CT brain without acute abnormalities, chest x-ray showed severe emphysema with patchy opacities in the bases. Given his history, he was started on empiric IV vancomycin and cefepime. Admitted for further evaluation and management.  Assessment and plan:  1. Acute encephalopathy: Likely multifactorial but most probably from medications (on long-term risperidone. Also on Opioids and patient's sister gave him a tablet of her Xanax due to his inability  to sleep) +/- hypercapnia. UDS positive for benzodiazepines. Narcotics were discontinued in the hospital. Monitor closely. Mental status improved and back to baseline which was confirmed with family today. Discussed in detail with patient's sister and advised her that he should not be given any medications other than his own and that his narcotics will be discontinued at discharge-hasn't required any pain medications while hospitalized. She verbalized understanding. 2. Acute respiratory failure with hypoxia, hypercapnia from advanced COPD: Briefly on BiPAP and was weaned off. Saturating in the mid 90s on room air with activity this morning. Received a dose of antibiotics in the ED but none since despite which she has made clinical improvement. In the absence of fevers, significant leukocytosis or productive cough, low index of suspicion for pneumonia and chest x-ray findings may be from atelectasis. Hence not discharged on antibiotics unless his clinical picture changes during outpatient follow-up. Briefly placed on IV steroids and bronchodilator nebulizations. Will be discharged on rapid prednisone taper and continue prior home inhalers. 3. Hyperlipidemia: Continue statins. 4. Schizophrenia: Stable without audiovisual hallucinations, suicidal or homicidal ideations. As discussed with family, patient has been on long-term medications i.e. risperidone and Cogentin-continue. He has outpatient follow-up with psychiatry. 5. Former tobacco abuse: States that he quit sometime ago.   Consultations:  None  Procedures:  None   Discharge Instructions  Discharge Instructions    Call MD for:    Complete by:  As directed    Recurrent or worsening confusion.   Call MD for:  difficulty breathing, headache or visual disturbances    Complete by:  As directed    Call MD for:  extreme fatigue    Complete by:  As directed    Call MD for:  persistant dizziness or  light-headedness    Complete by:  As directed     Diet - low sodium heart healthy    Complete by:  As directed    Increase activity slowly    Complete by:  As directed        Medication List    STOP taking these medications   HYDROcodone-acetaminophen 7.5-325 MG tablet Commonly known as:  NORCO     TAKE these medications   acetaminophen 325 MG tablet Commonly known as:  TYLENOL Take 2 tablets (650 mg total) by mouth every 6 (six) hours as needed for mild pain, moderate pain, fever or headache (or Fever >/= 101).   ADVAIR HFA 115-21 MCG/ACT inhaler Generic drug:  fluticasone-salmeterol Inhale 1 puff into the lungs 2 (two) times daily.   atorvastatin 10 MG tablet Commonly known as:  LIPITOR Take 10 mg by mouth daily at 6 PM.   B-12 PO Take 1 tablet by mouth daily as needed (occasional).   benztropine 2 MG tablet Commonly known as:  COGENTIN Take 2 mg by mouth 3 (three) times daily.   cetirizine 10 MG tablet Commonly known as:  ZYRTEC Take 10 mg by mouth at bedtime.   DIABETIC TUSSIN DM 10-100 MG/5ML liquid Generic drug:  dextromethorphan-guaiFENesin Take 10 mLs by mouth every 4 (four) hours as needed for cough.   enalapril 10 MG tablet Commonly known as:  VASOTEC Take 10 mg by mouth daily.   mirtazapine 30 MG tablet Commonly known as:  REMERON Take 30 mg by mouth at bedtime.   multivitamin with minerals Tabs tablet Take 1 tablet by mouth daily.   predniSONE 10 MG tablet Commonly known as:  DELTASONE Take 3 tabs daily for 2 days, then 2 tabs daily for 2 days, then 1 tab daily for 2 days, then stop. Start taking on:  06/19/2016   PROAIR HFA 108 (90 Base) MCG/ACT inhaler Generic drug:  albuterol Inhale 1 puff into the lungs every 6 (six) hours as needed for wheezing or shortness of breath.   risperidone 4 MG tablet Commonly known as:  RISPERDAL Take 4 mg by mouth 2 (two) times daily.   VITAMIN D PO Take 1 tablet by mouth daily as needed (occasional).      [place]. Go on 07/02/2016.   Why:  Appointment for at [facility] , Wednesday, July 02, 2016 at 10am. Please call if you are unable to keep this appointment.  [place] information: [address] [place] [id number]       KILPATRICK Abran Cantor, MD. Schedule an appointment as soon as possible for a visit in 5 day(s).   Specialty:  Pulmonary Disease Contact information: [place] [address] [phone]          No Known Allergies    Procedures/Studies:   Ct Head Wo Contrast  Result Date: 06/16/2016 CLINICAL DATA:  Found down on floor by family, fell out of bed. Altered mental status. History of hypertension, hyperlipidemia and schizophrenia. EXAM: CT HEAD WITHOUT CONTRAST TECHNIQUE: Contiguous axial images were obtained from the base of the skull through the vertex without intravenous contrast. COMPARISON:  MRI of the head September 22, 2015 FINDINGS: BRAIN: No intraparenchymal hemorrhage, mass effect nor midline shift. The ventricles and sulci are normal. No acute large vascular territory infarcts. No abnormal extra-axial fluid collections. Basal cisterns are patent. VASCULAR: Trace calcific atherosclerosis of the carotid siphon. SKULL/SOFT TISSUES: No skull  fracture. No significant soft tissue swelling. ORBITS/SINUSES: The included ocular globes and orbital contents are normal.The mastoid aircells and included paranasal sinuses are well-aerated. OTHER: None. IMPRESSION: Negative CT HEAD for age. Electronically Signed   By: Elon Alas M.D.   On: 06/16/2016 01:10   Dg Chest Port 1 View  Result Date: 06/15/2016 CLINICAL DATA:  Altered mental status.  Found on floor. EXAM: PORTABLE CHEST 1 VIEW COMPARISON:  05/28/2016, 05/24/2016, 09/21/2015 FINDINGS: Extensive bullous emphysematous changes are again evident, with patchy lung base opacities bilaterally which may represent superimposed pneumonia or atelectasis. No  pneumothorax. No large effusion. Normal heart size. IMPRESSION: Severe emphysematous disease. Patchy opacities in the lung bases may represent superimposed pneumonia or atelectasis. Electronically Signed   By: Andreas Newport M.D.   On: 06/15/2016 21:28     Subjective: Denies complaints. Denies dyspnea, cough, chest pain or fever.  Discharge Exam:  Vitals:   06/18/16 0821 06/18/16 0824 06/18/16 0825 06/18/16 0926  BP:    (!) 112/58  Pulse:    (!) 102  Resp:    18  Temp:    98.6 F (37 C)  TempSrc:    Oral  SpO2: 95% 95% 95% 97%  Weight:      Height:        General: Pt lying comfortably in bed & appears in no obvious distress. Seen ambulating comfortably in the room. Cardiovascular: S1 & S2 heard, RRR, S1/S2 +. No murmurs, rubs, gallops or clicks. No JVD or pedal edema. Tele-SR Respiratory: Clear to auscultation without wheezing, rhonchi or crackles. No increased work of breathing. Abdominal:  Non distended, non tender & soft. No organomegaly or masses appreciated. Normal bowel sounds heard. CNS: Alert and oriented. No focal deficits. Extremities: no edema, no cyanosis    The results of significant diagnostics from this hospitalization (including imaging, microbiology, ancillary and laboratory) are listed below for reference.     Microbiology: Recent Results (from the past 240 hour(s))  Blood culture (routine x 2)     Status: None (Preliminary result)   Collection Time: 06/15/16  9:17 PM  Result Value Ref Range Status   Specimen Description BLOOD  Final   Special Requests NONE  Final   Culture NO GROWTH 2 DAYS  Final   Report Status PENDING  Incomplete  Blood culture (routine x 2)     Status: None (Preliminary result)   Collection Time: 06/15/16  9:21 PM  Result Value Ref Range Status   Specimen Description BLOOD RIGHT ARM  Final   Special Requests   Final    BOTTLES DRAWN AEROBIC AND ANAEROBIC Blood Culture results may not be optimal due to an excessive volume of  blood received in culture bottles   Culture NO GROWTH 2 DAYS  Final   Report Status PENDING  Incomplete     Labs: CBC:  Recent Labs Lab 06/15/16 2117 06/17/16 0530  WBC 6.0 11.1*  HGB 14.9 13.6  HCT 44.3 40.7  MCV 88.1 88.1  PLT 191 212   Basic Metabolic Panel:  Recent Labs Lab 06/15/16 2117 06/16/16 0441 06/17/16 0530  NA 135 135 135  K 3.8 4.5 4.4  CL 96* 98* 100*  CO2 28 25 25   GLUCOSE 109* 137* 133*  BUN <5* <5* 6  CREATININE 0.72 0.78 0.70  CALCIUM 9.1 9.3 8.9   Liver Function Tests:  Recent Labs Lab 06/17/16 0530  AST 26  ALT 31  ALKPHOS 48  BILITOT 0.6  PROT 6.3*  ALBUMIN 3.3*  BNP (last 3 results)  Recent Labs  06/15/16 2117  BNP 9.5   Discussed with sister via phone. Updated care and answered questions.    Time coordinating discharge: Over 30 minutes  SIGNED:  Vernell Leep, MD, FACP, [place]. [facility] Pager [phone]  If 7PM-7AM, please contact night-coverage [email or web address] Password [email or web address] 06/18/2016, 2:50 PM

## 2016-06-21 LAB — CULTURE, BLOOD (ROUTINE X 2)
CULTURE: NO GROWTH
Culture: NO GROWTH

## 2016-06-24 DIAGNOSIS — J439 Emphysema, unspecified: Secondary | ICD-10-CM | POA: Diagnosis not present

## 2016-06-24 DIAGNOSIS — J189 Pneumonia, unspecified organism: Secondary | ICD-10-CM | POA: Diagnosis not present

## 2016-06-26 DIAGNOSIS — F319 Bipolar disorder, unspecified: Secondary | ICD-10-CM | POA: Diagnosis not present

## 2016-06-26 DIAGNOSIS — E7800 Pure hypercholesterolemia, unspecified: Secondary | ICD-10-CM | POA: Diagnosis not present

## 2016-06-26 DIAGNOSIS — Z79891 Long term (current) use of opiate analgesic: Secondary | ICD-10-CM | POA: Diagnosis not present

## 2016-06-26 DIAGNOSIS — D751 Secondary polycythemia: Secondary | ICD-10-CM | POA: Diagnosis not present

## 2016-06-26 DIAGNOSIS — M159 Polyosteoarthritis, unspecified: Secondary | ICD-10-CM | POA: Diagnosis not present

## 2016-06-26 DIAGNOSIS — J441 Chronic obstructive pulmonary disease with (acute) exacerbation: Secondary | ICD-10-CM | POA: Diagnosis not present

## 2016-06-26 DIAGNOSIS — Z79899 Other long term (current) drug therapy: Secondary | ICD-10-CM | POA: Diagnosis not present

## 2016-06-26 DIAGNOSIS — M2550 Pain in unspecified joint: Secondary | ICD-10-CM | POA: Diagnosis not present

## 2016-06-26 DIAGNOSIS — I119 Hypertensive heart disease without heart failure: Secondary | ICD-10-CM | POA: Diagnosis not present

## 2016-06-26 DIAGNOSIS — K267 Chronic duodenal ulcer without hemorrhage or perforation: Secondary | ICD-10-CM | POA: Diagnosis not present

## 2016-06-27 DIAGNOSIS — J189 Pneumonia, unspecified organism: Secondary | ICD-10-CM | POA: Diagnosis not present

## 2016-06-27 DIAGNOSIS — J439 Emphysema, unspecified: Secondary | ICD-10-CM | POA: Diagnosis not present

## 2016-06-30 DIAGNOSIS — J189 Pneumonia, unspecified organism: Secondary | ICD-10-CM | POA: Diagnosis not present

## 2016-06-30 DIAGNOSIS — J439 Emphysema, unspecified: Secondary | ICD-10-CM | POA: Diagnosis not present

## 2016-07-01 DIAGNOSIS — J189 Pneumonia, unspecified organism: Secondary | ICD-10-CM | POA: Diagnosis not present

## 2016-07-01 DIAGNOSIS — J439 Emphysema, unspecified: Secondary | ICD-10-CM | POA: Diagnosis not present

## 2016-07-02 ENCOUNTER — Encounter (HOSPITAL_COMMUNITY): Payer: Self-pay | Admitting: Clinical

## 2016-07-02 ENCOUNTER — Ambulatory Visit (INDEPENDENT_AMBULATORY_CARE_PROVIDER_SITE_OTHER): Payer: Medicare Other | Admitting: Clinical

## 2016-07-02 DIAGNOSIS — F200 Paranoid schizophrenia: Secondary | ICD-10-CM

## 2016-07-03 DIAGNOSIS — J439 Emphysema, unspecified: Secondary | ICD-10-CM | POA: Diagnosis not present

## 2016-07-03 DIAGNOSIS — J189 Pneumonia, unspecified organism: Secondary | ICD-10-CM | POA: Diagnosis not present

## 2016-07-03 NOTE — Progress Notes (Signed)
Comprehensive Clinical Assessment (CCA) Note  07/03/2016 TRIGGER FRASIER [id number]  Visit Diagnosis:      ICD-9-CM ICD-10-CM   1. Paranoid schizophrenia ([place]) 295.30 F20.0       CCA Part One  Part One has been completed on paper by the patient.  (See scanned document in Chart Review)  CCA Part Two A  Intake/Chief Complaint:  CCA Intake With Chief Complaint CCA Part Two Date: 07/02/16 CCA Part Two Time: 4 Chief Complaint/Presenting Problem: schizophrenia  Patients Currently Reported Symptoms/Problems: Health issues - COPD, Collateral Involvement: Sister - [name]  Individual's Strengths: "I am a good brother." Individual's Preferences: " I would like to breath better, and I would like to not hear to voice." Initial Clinical Notes/Concerns: 1980 schizophrenia  Mental Health Symptoms Depression:  Depression: Sleep (too much or little), Difficulty Concentrating, Fatigue, Hopelessness  Mania:  Mania: N/A  Anxiety:      Psychosis:  Psychosis: Delusions, Hallucinations  Trauma:     Obsessions:  Obsessions: N/A  Compulsions:  Compulsions: N/A  Inattention:  Inattention: N/A  Hyperactivity/Impulsivity:     Oppositional/Defiant Behaviors:  Oppositional/Defiant Behaviors: N/A  Borderline Personality:     Other Mood/Personality Symptoms:      Mental Status Exam Appearance and self-care  Stature:  Stature: Average  Weight:  Weight: Average weight  Clothing:  Clothing: Casual  Grooming:  Grooming: Normal  Cosmetic use:  Cosmetic Use: None  Posture/gait:  Posture/Gait: Normal  Motor activity:  Motor Activity: Restless  Sensorium  Attention:  Attention: Distractible  Concentration:  Concentration: Anxiety interferes  Orientation:  Orientation: X5  Recall/memory:  Recall/Memory: Defective in short-term  Affect and Mood  Affect:  Affect: Appropriate  Mood:     Relating  Eye contact:  Eye Contact: Normal  Facial expression:     Attitude toward examiner:  Attitude Toward  Examiner: Cooperative  Thought and Language  Speech flow: Speech Flow: Paucity  Thought content:  Thought Content: Appropriate to mood and circumstances  Preoccupation:     Hallucinations:     Organization:     Transport planner of Knowledge:  Fund of Knowledge: Impoverished by:  (Comment)  Intelligence:  Intelligence: Below average  Abstraction:     Judgement:  Judgement: Poor  Reality Testing:  Reality Testing: Distorted  Insight:  Insight: Fair  Decision Making:  Decision Making: Only simple  Social Functioning  Social Maturity:  Social Maturity: Isolates  Social Judgement:  Social Judgement: Naive  Stress  Stressors:  Stressors: Illness  Coping Ability:  Coping Ability: English as a second language teacher Deficits:     Supports:      Family and Psychosocial History: Family history Marital status: Single Are you sexually active?: No What is your sexual orientation?: none Does patient have children?: No  Childhood History:  Childhood History By whom was/is the patient raised?: Mother Additional childhood history information: Raised my mother, I ejoyed my childhood. I got skates for Christmas.  Description of patient's relationship with caregiver when they were a child: Mother - We got along pretty Nice Patient's description of current relationship with people who raised him/her: Mother passed away 6.5  - lived with her until she passed away.  Then I lived with Herbie Baltimore ( in [place]) moved in with my sister 3 or 4 month. She treats me pretty good.  How were you disciplined when you got in trouble as a child/adolescent?: Fuss and switched Does patient have siblings?: Yes Number of Siblings: 3 Description of patient's current relationship with  siblings: [name] - I get along alright , Herbie [place]- I get along pretty pretty good. [place] - pretty good Did patient suffer any verbal/emotional/physical/sexual abuse as a child?: No Did patient suffer from severe childhood neglect?: No Has patient  ever been sexually abused/assaulted/raped as an adolescent or adult?: No Was the patient ever a victim of a crime or a disaster?: No Witnessed domestic violence?: No Has patient been effected by domestic violence as an adult?: No  CCA Part Two B  Employment/Work Situation: Employment / Work Copywriter, advertising Employment situation: On disability Why is patient on disability: schizophenia  How long has patient been on disability: 1980's Patient's job has been impacted by current illness: No What is the longest time patient has a held a job?: 1 year  Where was the patient employed at that time?: the [employer] [place] the mall Has patient ever been in the [employer]?: No Are There Guns or Other Weapons in [place]?: No  Education: Education Last Grade Completed: 10 Did You Have An Individualized Education Program (IIEP): No Did You Have Any Difficulty At [employer]?: No  Religion: Religion/Spirituality Are You A Religious Person?: No What is Your Religious Affiliation?: Personal assistant: Leisure / Recreation Leisure and Hobbies: "None"  Exercise/Diet: Exercise/Diet Do You Exercise?: Yes What Type of Exercise Do You Do?: Run/Walk How Many Times a Week Do You Exercise?: 4-5 times a week Have You Gained or Lost A Significant Amount of Weight in the Past Six Months?: No Do You Follow a Special Diet?: No Do You Have Any Trouble Sleeping?: Yes Explanation of Sleeping Difficulties: sleeping is not on a set schedule and sometimes up and down all night.  CCA Part Two C  Alcohol/Drug Use: Alcohol / Drug Use Pain Medications: see chart Prescriptions: see chart Over the Counter: see chart History of alcohol / drug use?: No history of alcohol / drug abuse                      CCA Part Three  ASAM's:  Six Dimensions of Multidimensional Assessment  Dimension 1:  Acute Intoxication and/or Withdrawal Potential:     Dimension 2:  Biomedical Conditions and Complications:      Dimension 3:  Emotional, Behavioral, or Cognitive Conditions and Complications:     Dimension 4:  Readiness to Change:     Dimension 5:  Relapse, Continued use, or Continued Problem Potential:     Dimension 6:  Recovery/Living Environment:      Substance use Disorder (SUD)    Social Function:  Social Functioning Social Maturity: Isolates Social Judgement: Naive  Stress:  Stress Stressors: Illness Coping Ability: Overwhelmed Patient Takes Medications The Way The Doctor Instructed?: Other (Comment) (Waiting to see psychiatrist ) Priority Risk: Low Acuity  Risk Assessment- Self-Harm Potential: Risk Assessment For Self-Harm Potential Thoughts of Self-Harm: No current thoughts Method: No plan Availability of Means: No access/NA  Risk Assessment -Dangerous to Others Potential: Risk Assessment For Dangerous to Others Potential Method: No Plan Availability of Means: No access or NA Intent: Vague intent or NA Notification Required: No need or identified person Additional Comments for Danger to Others Potential: prior to diagnosed he would get angry easy.  DSM5 Diagnoses: Patient Active Problem List   Diagnosis Date Noted  . Acute respiratory failure with hypercapnia ([place]) 06/16/2016  . Acute respiratory failure with hypoxia ([place]) 06/16/2016  . Acute on chronic respiratory failure with hypoxia ([place]) 05/25/2016  . Altered mental status 09/22/2015  . Hyponatremia 09/22/2015  .  Elevated lactic acid level 09/22/2015  . Acute encephalopathy   . Bullous emphysema ([place]) 07/26/2013  . Schizophrenia ([place]) 06/28/2013  . Tobacco use disorder 06/06/2013  . Abdominal pain 06/03/2013  . Sepsis ([place]) 06/03/2013  . Elevated LFTs 06/03/2013  . HTN (hypertension) 06/03/2013  . HLD (hyperlipidemia) 06/03/2013  . Chronic obstructive pulmonary disease with acute exacerbation ([place]) 06/03/2013  . Calculus of bile duct without mention of cholecystitis or obstruction 06/03/2013    Patient  Centered Plan: Patient is on the following Treatment Plan(s):  Treatment plan to be formulated at next session Individual therapy 1x every 2-3 weeks, sessions to become less frequent as symptoms improve.  Recommendations for Services/Supports/Treatments: Recommendations for Services/Supports/Treatments Recommendations For Services/Supports/Treatments: Individual Therapy, Medication Management  Treatment Plan Summary:    Referrals to Alternative Service(s): Referred to Alternative Service(s):   Place:   Date:   Time:    Referred to Alternative Service(s):   Place:   Date:   Time:    Referred to Alternative Service(s):   Place:   Date:   Time:    Referred to Alternative Service(s):   Place:   Date:   Time:     Manila Rommel A

## 2016-07-08 DIAGNOSIS — J189 Pneumonia, unspecified organism: Secondary | ICD-10-CM | POA: Diagnosis not present

## 2016-07-08 DIAGNOSIS — J439 Emphysema, unspecified: Secondary | ICD-10-CM | POA: Diagnosis not present

## 2016-07-10 DIAGNOSIS — J439 Emphysema, unspecified: Secondary | ICD-10-CM | POA: Diagnosis not present

## 2016-07-10 DIAGNOSIS — J189 Pneumonia, unspecified organism: Secondary | ICD-10-CM | POA: Diagnosis not present

## 2016-07-15 DIAGNOSIS — J189 Pneumonia, unspecified organism: Secondary | ICD-10-CM | POA: Diagnosis not present

## 2016-07-15 DIAGNOSIS — J439 Emphysema, unspecified: Secondary | ICD-10-CM | POA: Diagnosis not present

## 2016-07-16 DIAGNOSIS — J439 Emphysema, unspecified: Secondary | ICD-10-CM | POA: Diagnosis not present

## 2016-07-16 DIAGNOSIS — J189 Pneumonia, unspecified organism: Secondary | ICD-10-CM | POA: Diagnosis not present

## 2016-07-23 DIAGNOSIS — J189 Pneumonia, unspecified organism: Secondary | ICD-10-CM | POA: Diagnosis not present

## 2016-07-23 DIAGNOSIS — J439 Emphysema, unspecified: Secondary | ICD-10-CM | POA: Diagnosis not present

## 2016-07-24 DIAGNOSIS — J439 Emphysema, unspecified: Secondary | ICD-10-CM | POA: Diagnosis not present

## 2016-07-24 DIAGNOSIS — J189 Pneumonia, unspecified organism: Secondary | ICD-10-CM | POA: Diagnosis not present

## 2016-07-28 ENCOUNTER — Ambulatory Visit (INDEPENDENT_AMBULATORY_CARE_PROVIDER_SITE_OTHER): Payer: Medicare Other | Admitting: Clinical

## 2016-07-28 ENCOUNTER — Encounter (HOSPITAL_COMMUNITY): Payer: Self-pay | Admitting: Clinical

## 2016-07-28 DIAGNOSIS — F200 Paranoid schizophrenia: Secondary | ICD-10-CM | POA: Diagnosis not present

## 2016-07-28 NOTE — Progress Notes (Signed)
   THERAPIST PROGRESS NOTE  Session Time: 11:10 -11:56  Participation Level: Active  Behavioral Response: CasualConfused and DrowsyNA  Type of Therapy: Individual Therapy  Treatment Goals addressed: Improve Psychiatric symptoms, elevate mood,  keep a regular schedule, healthy coping skills  Interventions: Motivational Interviewing psychoeducation  Summary: Blake Mcknight is a 61 y.o. male who presents with paranoid schizophrenia.   Suicidal/Homicidal: Nowithout intent/plan  Therapist Response: Blake Mcknight met with clinician for an individual therapy session. He was joined in the session by his sister and care taker.  Client, clinician and Sister discussed his goals for therapy.  Clinician asked open ended questions. Blake Mcknight shared that he was doing about the same. He shared about his hallucinations. He denied a asked him to harm himself or others. Client and clinician discussed the possibility of him anticipating and a program like [facility] which is a daytime clubhouse program for folks with severe persistent mental illness. His sister said that she would like For him but was concerned that he may not be willing to go. Clinician encouraged them both to go take a look at it. Sister shared that she doesn't have an income and if he went to a day program she could creating income. Clinician asked open ended questions and Blake Mcknight shared the most difficult thing for him right now was that his breathing causes him pain. Blake Mcknight shared that he does not sleep much at night.  Client and clinician and sister discussed keeping a regular schedule.  Client and clinician discussed some basic sleep hygiene things.   Plan: Return again in 2-3  weeks.  Diagnosis: Axis I: paranoid schizophrenia.      Blake Mcknight A, LCSW 07/28/2016

## 2016-07-29 DIAGNOSIS — J439 Emphysema, unspecified: Secondary | ICD-10-CM | POA: Diagnosis not present

## 2016-07-29 DIAGNOSIS — J189 Pneumonia, unspecified organism: Secondary | ICD-10-CM | POA: Diagnosis not present

## 2016-07-30 DIAGNOSIS — J9621 Acute and chronic respiratory failure with hypoxia: Secondary | ICD-10-CM | POA: Diagnosis not present

## 2016-07-30 DIAGNOSIS — J439 Emphysema, unspecified: Secondary | ICD-10-CM | POA: Diagnosis not present

## 2016-08-05 DIAGNOSIS — J439 Emphysema, unspecified: Secondary | ICD-10-CM | POA: Diagnosis not present

## 2016-08-05 DIAGNOSIS — J9621 Acute and chronic respiratory failure with hypoxia: Secondary | ICD-10-CM | POA: Diagnosis not present

## 2016-08-08 ENCOUNTER — Encounter (HOSPITAL_COMMUNITY): Payer: Self-pay | Admitting: Psychiatry

## 2016-08-08 ENCOUNTER — Ambulatory Visit (INDEPENDENT_AMBULATORY_CARE_PROVIDER_SITE_OTHER): Payer: Medicare Other | Admitting: Psychiatry

## 2016-08-08 VITALS — BP 122/70 | HR 101 | Ht 66.0 in | Wt 140.6 lb

## 2016-08-08 DIAGNOSIS — F17210 Nicotine dependence, cigarettes, uncomplicated: Secondary | ICD-10-CM

## 2016-08-08 DIAGNOSIS — F200 Paranoid schizophrenia: Secondary | ICD-10-CM

## 2016-08-08 DIAGNOSIS — G4700 Insomnia, unspecified: Secondary | ICD-10-CM

## 2016-08-08 MED ORDER — TRAZODONE HCL 50 MG PO TABS: 50.0000 mg | ORAL_TABLET | Freq: Every day | ORAL | 1 refills | Status: DC

## 2016-08-08 NOTE — Progress Notes (Signed)
Psychiatric Initial Adult Assessment   Patient Identification: Blake Mcknight MRN:  [id number] Date of Evaluation:  08/08/2016 Referral Source: Primary care physician Dr. Katherine Roan  Chief Complaint:   Chief Complaint    Establish Care; Schizophrenia     Visit Diagnosis:    ICD-9-CM ICD-10-CM   1. Paranoid schizophrenia ([place]) 295.30 F20.0 traZODone (DESYREL) 50 MG tablet    History of Present Illness:  Patient is 61 year old African-American unemployed single man who is referred from his primary care physician primarily because of insomnia.  Patient has history of schizophrenia and he has not seen psychiatrist for more than 15 years.  Patient is a poor historian and most of the information was obtained from his sister who accompanied him today.  Patient is taking Risperdal 4 mg twice a day and Remeron 30 mg at bedtime.  Recently sister noticed that he has trouble sleeping and only sleeping 1-2 hours.  He is up all night and talking to himself.  Sister endorsed that he's been hearing voices for at least 25 years but he is not violent and aggressive which he used to be in the past.  She recall only one psychiatric hospitalization grief 5 years ago at [facility] because of paranoia, delusions, aggressive behavior, poor impulse control and disorder denies thinking.  Patient was living with his mother until 6 years ago when mother died.  After that he started living with his brother and recently his sister took the responsibility.  Patient admitted hearing voices which are on a specific but he is unable to provide more details.  He was notice inappropriate laughing, giggling and making poor eye contact.  He does not remember his medication very well.  Initially he thought that he is here for breathing problem because he having shortness of breath.  He do not recall when was the last time he saw psychiatrist.  He admitted poor sleep but denies any suicidal thoughts or homicidal thought.  During the  session patient was very quiet.  His sister is concerned because of insomnia.  She does not want to change Risperdal because it is helping his aggressive behavior, paranoia, delusions and to some extent hallucinations.  Patient is not aggressive or agitated.  He also started seeing [name] for counseling.  Patient usually stays to himself as per sister.  He does not leave the house unless it is important.  His sister takes him to the doctor's appointment.  Patient has mild tremors which are chronic in nature.  Patient denies any discomfort with these tremors.  As per sister patient denies any nightmares, OCD symptoms, panic attack, PTSD symptoms.  His appetite is okay.  His vital signs are stable.  Past Psychiatric History: As per sister, patient has history of psychiatric hospitalization at [facility] years ago.  At that time he was very paranoid, delusion, disorganized behavior and having mood swings.  Since then his medications are filled by his primary care physician.  He's been taking Risperdal 4 mg twice a day and Remeron 30 mg at bedtime.  Previous Psychotropic Medications: Yes   Substance Abuse History in the last 12 months:  No.  Consequences of Substance Abuse: NA  Past Medical History:  Past Medical History:  Diagnosis Date  . COPD (chronic obstructive pulmonary disease) (HCC)    with bullous emphysema.   Marland Kitchen Heavy cigarette smoker before 2003   pt claims only 10 cigs per day, never heavier amounts.   Marland Kitchen HLD (hyperlipidemia)   . Hypertension   .  Schizophrenia ([place]) 06/28/2013   This is a chronic condition and he lives with family    Past Surgical History:  Procedure Laterality Date  . CHOLECYSTECTOMY N/A 06/06/2013   Procedure: LAPAROSCOPIC CHOLECYSTECTOMY WITH ATTEMPTED INTRAOPERATIVE CHOLANGIOGRAM;  Surgeon: Zenovia Jarred, MD;  Location: [facility];  Service: General;  Laterality: N/A;  . ERCP N/A 06/03/2013   Procedure: ENDOSCOPIC RETROGRADE CHOLANGIOPANCREATOGRAPHY (ERCP);   Surgeon: Ladene Artist, MD;  Location: [facility] ENDOSCOPY;  Service: Endoscopy;  Laterality: N/A;  . NO PAST SURGERIES      Family Psychiatric History: Unknown  Family History:  Family History  Problem Relation Age of Onset  . Diabetes Mellitus II Mother   . Diabetes Mother   . Heart disease Father   . Stroke Maternal Aunt   . CAD Neg Hx     Social History:   Social History   Social History  . Marital status: Single    Spouse name: N/A  . Number of children: N/A  . Years of education: N/A   Social History Main Topics  . Smoking status: Current Some Day Smoker    Packs/day: 1.00    Years: 10.00    Types: Cigarettes  . Smokeless tobacco: Former Systems developer    Types: Snuff  . Alcohol use No  . Drug use: No  . Sexual activity: Yes   Other Topics Concern  . None   Social History Narrative  . None    Additional Social History: Patient born and raised in [place].  He never married and he has no children.  As per sister patient has history of disorganized behavior since his teens.  He used to be very aggressive, paranoid and delusional.  He was living with his mother until 6 years ago when mother died and then patient moved to his brother's house.  Patient has a good support from his sister and brother.  Allergies:  No Known Allergies  Metabolic Disorder Labs: Recent Results (from the past 2160 hour(s))  I-Stat Arterial Blood Gas, ED - (order at [facility] and [facility] only)     Status: Abnormal   Collection Time: 05/25/16 12:11 AM  Result Value Ref Range   pH, Arterial 7.387 7.350 - 7.450   pCO2 arterial 39.4 32.0 - 48.0 mmHg   pO2, Arterial 63.0 (L) 83.0 - 108.0 mmHg   Bicarbonate 23.7 20.0 - 28.0 mmol/L   TCO2 25 0 - 100 mmol/L   O2 Saturation 91.0 %   Acid-base deficit 1.0 0.0 - 2.0 mmol/L   Patient temperature HIDE    Collection site RADIAL, ALLEN'S TEST ACCEPTABLE    Drawn by RT    Sample type ARTERIAL   CBC with Differential     Status: None   Collection Time: 05/25/16  12:15 AM  Result Value Ref Range   WBC 10.4 4.0 - 10.5 K/uL   RBC 5.17 4.22 - 5.81 MIL/uL   Hemoglobin 16.0 13.0 - 17.0 g/dL   HCT 44.5 39.0 - 52.0 %   MCV 86.1 78.0 - 100.0 fL   MCH 30.9 26.0 - 34.0 pg   MCHC 36.0 30.0 - 36.0 g/dL   RDW 14.3 11.5 - 15.5 %   Platelets 242 150 - 400 K/uL   Neutrophils Relative % 72 %   Neutro Abs 7.4 1.7 - 7.7 K/uL   Lymphocytes Relative 22 %   Lymphs Abs 2.3 0.7 - 4.0 K/uL   Monocytes Relative 5 %   Monocytes Absolute 0.5 0.1 - 1.0 K/uL  Eosinophils Relative 1 %   Eosinophils Absolute 0.1 0.0 - 0.7 K/uL   Basophils Relative 0 %   Basophils Absolute 0.0 0.0 - 0.1 K/uL  Basic metabolic panel     Status: Abnormal   Collection Time: 05/25/16 12:15 AM  Result Value Ref Range   Sodium 128 (L) 135 - 145 mmol/L   Potassium 3.2 (L) 3.5 - 5.1 mmol/L   Chloride 91 (L) 101 - 111 mmol/L   CO2 24 22 - 32 mmol/L   Glucose, Bld 174 (H) 65 - 99 mg/dL   BUN 8 6 - 20 mg/dL   Creatinine, Ser 0.76 0.61 - 1.24 mg/dL   Calcium 9.1 8.9 - 10.3 mg/dL   GFR calc non Af Amer >60 >60 mL/min   GFR calc Af Amer >60 >60 mL/min    Comment: (NOTE) The eGFR has been calculated using the CKD EPI equation. This calculation has not been validated in all clinical situations. eGFR's persistently <60 mL/min signify possible Chronic Kidney Disease.    Anion gap 13 5 - 15  Blood Culture (routine x 2)     Status: None   Collection Time: 05/25/16 12:21 AM  Result Value Ref Range   Specimen Description BLOOD RIGHT ANTECUBITAL    Special Requests BOTTLES DRAWN AEROBIC ONLY 5CC    Culture NO GROWTH 5 DAYS    Report Status 05/30/2016 FINAL   I-Stat Troponin, ED (not at [facility])     Status: None   Collection Time: 05/25/16 12:26 AM  Result Value Ref Range   Troponin i, poc 0.01 0.00 - 0.08 ng/mL   Comment 3            Comment: Due to the release kinetics of cTnI, a negative result within the first hours of the onset of symptoms does not rule out myocardial infarction with  certainty. If myocardial infarction is still suspected, repeat the test at appropriate intervals.   I-Stat CG4 Lactic Acid, ED     Status: Abnormal   Collection Time: 05/25/16  3:08 AM  Result Value Ref Range   Lactic Acid, Venous 5.02 (HH) 0.5 - 1.9 mmol/L   Comment NOTIFIED PHYSICIAN   Blood Culture (routine x 2)     Status: None   Collection Time: 05/25/16  3:31 AM  Result Value Ref Range   Specimen Description BLOOD RIGHT FOREARM    Special Requests BOTTLES DRAWN AEROBIC AND ANAEROBIC 5CC EA    Culture NO GROWTH 5 DAYS    Report Status 05/30/2016 FINAL   Urinalysis, Routine w reflex microscopic     Status: Abnormal   Collection Time: 05/25/16  5:54 AM  Result Value Ref Range   Color, Urine AMBER (A) YELLOW    Comment: BIOCHEMICALS MAY BE AFFECTED BY COLOR   APPearance HAZY (A) CLEAR   Specific Gravity, Urine 1.009 1.005 - 1.030   pH 6.0 5.0 - 8.0   Glucose, UA 150 (A) NEGATIVE mg/dL   Hgb urine dipstick NEGATIVE NEGATIVE   Bilirubin Urine NEGATIVE NEGATIVE   Ketones, ur NEGATIVE NEGATIVE mg/dL   Protein, ur NEGATIVE NEGATIVE mg/dL   Nitrite NEGATIVE NEGATIVE   Leukocytes, UA NEGATIVE NEGATIVE  Strep pneumoniae urinary antigen     Status: None   Collection Time: 05/25/16  5:54 AM  Result Value Ref Range   Strep Pneumo Urinary Antigen NEGATIVE NEGATIVE    Comment:        Infection due to S. pneumoniae cannot be absolutely ruled out since the antigen present  may be below the detection limit of the test.   I-Stat arterial blood gas, ED     Status: Abnormal   Collection Time: 05/25/16  6:06 AM  Result Value Ref Range   pH, Arterial 7.068 (LL) 7.350 - 7.450   pCO2 arterial 58.2 (H) 32.0 - 48.0 mmHg   pO2, Arterial 116.0 (H) 83.0 - 108.0 mmHg   Bicarbonate 17.0 (L) 20.0 - 28.0 mmol/L   TCO2 19 0 - 100 mmol/L   O2 Saturation 96.0 %   Acid-base deficit 14.0 (H) 0.0 - 2.0 mmol/L   Patient temperature 36.3 C    Collection site RADIAL, ALLEN'S TEST ACCEPTABLE    Drawn by  RT    Sample type ARTERIAL    Comment NOTIFIED PHYSICIAN   Influenza panel by PCR (type A & B)     Status: None   Collection Time: 05/25/16  6:38 AM  Result Value Ref Range   Influenza A By PCR NEGATIVE NEGATIVE   Influenza B By PCR NEGATIVE NEGATIVE    Comment: (NOTE) The Xpert Xpress Flu assay is intended as an aid in the diagnosis of  influenza and should not be used as a sole basis for treatment.  This  assay is FDA approved for nasopharyngeal swab specimens only. Nasal  washings and aspirates are unacceptable for Xpert Xpress Flu testing.   I-Stat CG4 Lactic Acid, ED     Status: Abnormal   Collection Time: 05/25/16  7:02 AM  Result Value Ref Range   Lactic Acid, Venous 6.74 (HH) 0.5 - 1.9 mmol/L   Comment NOTIFIED PHYSICIAN   HIV antibody (Routine Testing)     Status: None   Collection Time: 05/25/16  7:06 AM  Result Value Ref Range   HIV Screen 4th Generation wRfx Non Reactive Non Reactive    Comment: (NOTE) Performed At: [facility] [place], [place] [id number] Lindon Romp MD QI:[id number]   MRSA PCR Screening     Status: None   Collection Time: 05/25/16  9:14 AM  Result Value Ref Range   MRSA by PCR NEGATIVE NEGATIVE    Comment:        The GeneXpert MRSA Assay (FDA approved for NASAL specimens only), is one component of a comprehensive MRSA colonization surveillance program. It is not intended to diagnose MRSA infection nor to guide or monitor treatment for MRSA infections.   Glucose, capillary     Status: Abnormal   Collection Time: 05/25/16  9:15 AM  Result Value Ref Range   Glucose-Capillary 139 (H) 65 - 99 mg/dL   Comment 1 Notify RN    Comment 2 Document in Chart   I-STAT 3, arterial blood gas (G3+)     Status: Abnormal   Collection Time: 05/25/16 11:02 AM  Result Value Ref Range   pH, Arterial 7.185 (LL) 7.350 - 7.450   pCO2 arterial 34.9 32.0 - 48.0 mmHg   pO2, Arterial 98.0 83.0 - 108.0 mmHg   Bicarbonate 13.2 (L) 20.0  - 28.0 mmol/L   TCO2 14 0 - 100 mmol/L   O2 Saturation 96.0 %   Acid-base deficit 14.0 (H) 0.0 - 2.0 mmol/L   Patient temperature 98.6 F    Collection site RADIAL, ALLEN'S TEST ACCEPTABLE    Drawn by RT    Sample type ARTERIAL    Comment NOTIFIED PHYSICIAN   I-STAT 3, arterial blood gas (G3+)     Status: Abnormal   Collection Time: 05/25/16  1:36 PM  Result  Value Ref Range   pH, Arterial 7.231 (L) 7.350 - 7.450   pCO2 arterial 37.5 32.0 - 48.0 mmHg   pO2, Arterial 79.0 (L) 83.0 - 108.0 mmHg   Bicarbonate 15.7 (L) 20.0 - 28.0 mmol/L   TCO2 17 0 - 100 mmol/L   O2 Saturation 93.0 %   Acid-base deficit 11.0 (H) 0.0 - 2.0 mmol/L   Patient temperature 98.5 F    Collection site RADIAL, ALLEN'S TEST ACCEPTABLE    Drawn by RT    Sample type ARTERIAL   CBC     Status: Abnormal   Collection Time: 05/25/16  2:27 PM  Result Value Ref Range   WBC 12.4 (H) 4.0 - 10.5 K/uL   RBC 4.48 4.22 - 5.81 MIL/uL   Hemoglobin 13.0 13.0 - 17.0 g/dL   HCT 38.7 (L) 39.0 - 52.0 %   MCV 86.4 78.0 - 100.0 fL   MCH 29.0 26.0 - 34.0 pg   MCHC 33.6 30.0 - 36.0 g/dL   RDW 14.5 11.5 - 15.5 %   Platelets 211 150 - 400 K/uL  Magnesium     Status: None   Collection Time: 05/25/16  2:27 PM  Result Value Ref Range   Magnesium 2.4 1.7 - 2.4 mg/dL  Phosphorus     Status: None   Collection Time: 05/25/16  2:27 PM  Result Value Ref Range   Phosphorus 3.3 2.5 - 4.6 mg/dL  Lactic acid, plasma     Status: Abnormal   Collection Time: 05/25/16  2:27 PM  Result Value Ref Range   Lactic Acid, Venous 5.2 (HH) 0.5 - 1.9 mmol/L    Comment: CRITICAL RESULT CALLED TO, READ BACK BY AND VERIFIED WITH: J.DRAPER,RN 05/25/16 '@1549'  BY V.WILKINS   Legionella Pneumophila Serogp 1 Ur Ag     Status: None   Collection Time: 05/25/16  3:54 PM  Result Value Ref Range   L. pneumophila Serogp 1 Ur Ag Negative Negative    Comment: (NOTE) Presumptive negative for L. pneumophila serogroup 1 antigen in urine, suggesting no recent or  current infection. Legionnaires' disease cannot be ruled out since other serogroups and species may also cause disease. Performed At: [facility] [place], [place] [id number] Lindon Romp MD EZ:[id number]   Rapid urine drug screen (hospital performed)     Status: Abnormal   Collection Time: 05/25/16  3:54 PM  Result Value Ref Range   Opiates POSITIVE (A) NONE DETECTED   Cocaine NONE DETECTED NONE DETECTED   Benzodiazepines POSITIVE (A) NONE DETECTED   Amphetamines NONE DETECTED NONE DETECTED   Tetrahydrocannabinol NONE DETECTED NONE DETECTED   Barbiturates NONE DETECTED NONE DETECTED    Comment:        DRUG SCREEN FOR MEDICAL PURPOSES ONLY.  IF CONFIRMATION IS NEEDED FOR ANY PURPOSE, NOTIFY LAB WITHIN 5 DAYS.        LOWEST DETECTABLE LIMITS FOR URINE DRUG SCREEN Drug Class       Cutoff (ng/mL) Amphetamine      1000 Barbiturate      200 Benzodiazepine   503 Tricyclics       546 Opiates          300 Cocaine          300 THC              50   I-STAT 3, arterial blood gas (G3+)     Status: Abnormal   Collection Time: 05/25/16  3:59 PM  Result Value Ref  Range   pH, Arterial 7.245 (L) 7.350 - 7.450   pCO2 arterial 34.9 32.0 - 48.0 mmHg   pO2, Arterial 81.0 (L) 83.0 - 108.0 mmHg   Bicarbonate 15.2 (L) 20.0 - 28.0 mmol/L   TCO2 16 0 - 100 mmol/L   O2 Saturation 94.0 %   Acid-base deficit 11.0 (H) 0.0 - 2.0 mmol/L   Patient temperature 98.5 F    Collection site RADIAL, ALLEN'S TEST ACCEPTABLE    Drawn by Operator    Sample type ARTERIAL   Glucose, capillary     Status: Abnormal   Collection Time: 05/25/16  4:37 PM  Result Value Ref Range   Glucose-Capillary 170 (H) 65 - 99 mg/dL   Comment 1 Notify RN    Comment 2 Document in Chart   Glucose, capillary     Status: Abnormal   Collection Time: 05/25/16  7:40 PM  Result Value Ref Range   Glucose-Capillary 146 (H) 65 - 99 mg/dL   Comment 1 Notify RN   Glucose, capillary     Status: Abnormal    Collection Time: 05/26/16 12:29 AM  Result Value Ref Range   Glucose-Capillary 162 (H) 65 - 99 mg/dL   Comment 1 Notify RN   Blood gas, arterial     Status: None   Collection Time: 05/26/16  3:25 AM  Result Value Ref Range   FIO2 35.00    Delivery systems VENTILATOR    Mode PRESSURE CONTROL    LHR 14 resp/min   Peep/cpap 5.0 cm H20   Pressure control 20 cm H20   pH, Arterial 7.371 7.350 - 7.450   pCO2 arterial 39.9 32.0 - 48.0 mmHg   pO2, Arterial 86.7 83.0 - 108.0 mmHg   Bicarbonate 22.8 20.0 - 28.0 mmol/L   Acid-base deficit 1.8 0.0 - 2.0 mmol/L   O2 Saturation 96.3 %   Patient temperature 97.0    Collection site LEFT RADIAL    Drawn by [id number]    Sample type ARTERIAL DRAW    Allens test (pass/fail) PASS PASS  Glucose, capillary     Status: Abnormal   Collection Time: 05/26/16  3:41 AM  Result Value Ref Range   Glucose-Capillary 162 (H) 65 - 99 mg/dL   Comment 1 Notify RN   Basic metabolic panel     Status: Abnormal   Collection Time: 05/26/16  4:27 AM  Result Value Ref Range   Sodium 131 (L) 135 - 145 mmol/L   Potassium 4.9 3.5 - 5.1 mmol/L    Comment: NO VISIBLE HEMOLYSIS   Chloride 100 (L) 101 - 111 mmol/L   CO2 23 22 - 32 mmol/L   Glucose, Bld 160 (H) 65 - 99 mg/dL   BUN 11 6 - 20 mg/dL   Creatinine, Ser 0.65 0.61 - 1.24 mg/dL   Calcium 8.2 (L) 8.9 - 10.3 mg/dL   GFR calc non Af Amer >60 >60 mL/min   GFR calc Af Amer >60 >60 mL/min    Comment: (NOTE) The eGFR has been calculated using the CKD EPI equation. This calculation has not been validated in all clinical situations. eGFR's persistently <60 mL/min signify possible Chronic Kidney Disease.    Anion gap 8 5 - 15  CBC     Status: Abnormal   Collection Time: 05/26/16  4:27 AM  Result Value Ref Range   WBC 14.7 (H) 4.0 - 10.5 K/uL   RBC 4.79 4.22 - 5.81 MIL/uL   Hemoglobin 14.0 13.0 - 17.0 g/dL  HCT 41.4 39.0 - 52.0 %   MCV 86.4 78.0 - 100.0 fL   MCH 29.2 26.0 - 34.0 pg   MCHC 33.8 30.0 - 36.0 g/dL    RDW 14.7 11.5 - 15.5 %   Platelets 217 150 - 400 K/uL  Magnesium     Status: Abnormal   Collection Time: 05/26/16  4:27 AM  Result Value Ref Range   Magnesium 2.5 (H) 1.7 - 2.4 mg/dL  Phosphorus     Status: None   Collection Time: 05/26/16  4:27 AM  Result Value Ref Range   Phosphorus 2.9 2.5 - 4.6 mg/dL  Glucose, capillary     Status: Abnormal   Collection Time: 05/26/16  8:40 AM  Result Value Ref Range   Glucose-Capillary 182 (H) 65 - 99 mg/dL   Comment 1 Notify RN    Comment 2 Document in Chart   Glucose, capillary     Status: Abnormal   Collection Time: 05/26/16 11:41 AM  Result Value Ref Range   Glucose-Capillary 110 (H) 65 - 99 mg/dL   Comment 1 Notify RN    Comment 2 Document in Chart   I-STAT 3, arterial blood gas (G3+)     Status: Abnormal   Collection Time: 05/26/16  3:31 PM  Result Value Ref Range   pH, Arterial 7.455 (H) 7.350 - 7.450   pCO2 arterial 36.7 32.0 - 48.0 mmHg   pO2, Arterial 87.0 83.0 - 108.0 mmHg   Bicarbonate 25.8 20.0 - 28.0 mmol/L   TCO2 27 0 - 100 mmol/L   O2 Saturation 97.0 %   Acid-Base Excess 2.0 0.0 - 2.0 mmol/L   Patient temperature HIDE    Collection site RADIAL, ALLEN'S TEST ACCEPTABLE    Drawn by Operator    Sample type ARTERIAL   Lactic acid, plasma     Status: Abnormal   Collection Time: 05/26/16  3:36 PM  Result Value Ref Range   Lactic Acid, Venous 3.3 (HH) 0.5 - 1.9 mmol/L    Comment: CRITICAL RESULT CALLED TO, READ BACK BY AND VERIFIED WITH: A SHYOSKO,RN [id number] 05/26/2016 WBOND   Glucose, capillary     Status: None   Collection Time: 05/26/16  4:13 PM  Result Value Ref Range   Glucose-Capillary 98 65 - 99 mg/dL   Comment 1 Document in Chart   Glucose, capillary     Status: Abnormal   Collection Time: 05/26/16  7:32 PM  Result Value Ref Range   Glucose-Capillary 115 (H) 65 - 99 mg/dL   Comment 1 Notify RN    Comment 2 Document in Chart   Glucose, capillary     Status: Abnormal   Collection Time: 05/27/16 12:09 AM  Result  Value Ref Range   Glucose-Capillary 124 (H) 65 - 99 mg/dL   Comment 1 Notify RN    Comment 2 Document in Chart   Lactic acid, plasma     Status: None   Collection Time: 05/27/16  2:06 AM  Result Value Ref Range   Lactic Acid, Venous 1.6 0.5 - 1.9 mmol/L  CBC     Status: Abnormal   Collection Time: 05/27/16  2:06 AM  Result Value Ref Range   WBC 20.4 (H) 4.0 - 10.5 K/uL   RBC 4.50 4.22 - 5.81 MIL/uL   Hemoglobin 12.9 (L) 13.0 - 17.0 g/dL   HCT 38.7 (L) 39.0 - 52.0 %   MCV 86.0 78.0 - 100.0 fL   MCH 28.7 26.0 - 34.0 pg   MCHC  33.3 30.0 - 36.0 g/dL   RDW 14.8 11.5 - 15.5 %   Platelets 210 150 - 400 K/uL  Basic metabolic panel     Status: Abnormal   Collection Time: 05/27/16  2:06 AM  Result Value Ref Range   Sodium 132 (L) 135 - 145 mmol/L   Potassium 3.8 3.5 - 5.1 mmol/L    Comment: DELTA CHECK NOTED   Chloride 96 (L) 101 - 111 mmol/L   CO2 28 22 - 32 mmol/L   Glucose, Bld 123 (H) 65 - 99 mg/dL   BUN 14 6 - 20 mg/dL   Creatinine, Ser 0.63 0.61 - 1.24 mg/dL   Calcium 8.3 (L) 8.9 - 10.3 mg/dL   GFR calc non Af Amer >60 >60 mL/min   GFR calc Af Amer >60 >60 mL/min    Comment: (NOTE) The eGFR has been calculated using the CKD EPI equation. This calculation has not been validated in all clinical situations. eGFR's persistently <60 mL/min signify possible Chronic Kidney Disease.    Anion gap 8 5 - 15  Magnesium     Status: None   Collection Time: 05/27/16  2:06 AM  Result Value Ref Range   Magnesium 2.0 1.7 - 2.4 mg/dL  Phosphorus     Status: None   Collection Time: 05/27/16  2:06 AM  Result Value Ref Range   Phosphorus 2.9 2.5 - 4.6 mg/dL  Troponin I     Status: None   Collection Time: 05/27/16  2:06 AM  Result Value Ref Range   Troponin I <0.03 <0.03 ng/mL  Glucose, capillary     Status: Abnormal   Collection Time: 05/27/16  3:13 AM  Result Value Ref Range   Glucose-Capillary 123 (H) 65 - 99 mg/dL   Comment 1 Notify RN    Comment 2 Document in Chart   Glucose,  capillary     Status: Abnormal   Collection Time: 05/27/16  8:20 AM  Result Value Ref Range   Glucose-Capillary 118 (H) 65 - 99 mg/dL   Comment 1 Notify RN    Comment 2 Document in Chart   Glucose, capillary     Status: Abnormal   Collection Time: 05/27/16 11:34 AM  Result Value Ref Range   Glucose-Capillary 104 (H) 65 - 99 mg/dL   Comment 1 Notify RN    Comment 2 Document in Chart   Glucose, capillary     Status: Abnormal   Collection Time: 05/27/16  3:17 PM  Result Value Ref Range   Glucose-Capillary 136 (H) 65 - 99 mg/dL   Comment 1 Document in Chart   Glucose, capillary     Status: Abnormal   Collection Time: 05/27/16  7:53 PM  Result Value Ref Range   Glucose-Capillary 101 (H) 65 - 99 mg/dL  Glucose, capillary     Status: Abnormal   Collection Time: 05/28/16 12:07 AM  Result Value Ref Range   Glucose-Capillary 124 (H) 65 - 99 mg/dL  Magnesium     Status: None   Collection Time: 05/28/16  3:53 AM  Result Value Ref Range   Magnesium 2.1 1.7 - 2.4 mg/dL  Phosphorus     Status: None   Collection Time: 05/28/16  3:53 AM  Result Value Ref Range   Phosphorus 3.1 2.5 - 4.6 mg/dL  CBC     Status: Abnormal   Collection Time: 05/28/16  3:53 AM  Result Value Ref Range   WBC 15.7 (H) 4.0 - 10.5 K/uL   RBC 4.36  4.22 - 5.81 MIL/uL   Hemoglobin 12.6 (L) 13.0 - 17.0 g/dL   HCT 37.5 (L) 39.0 - 52.0 %   MCV 86.0 78.0 - 100.0 fL   MCH 28.9 26.0 - 34.0 pg   MCHC 33.6 30.0 - 36.0 g/dL   RDW 14.6 11.5 - 15.5 %   Platelets 232 150 - 400 K/uL  Basic metabolic panel     Status: Abnormal   Collection Time: 05/28/16  3:53 AM  Result Value Ref Range   Sodium 130 (L) 135 - 145 mmol/L   Potassium 3.6 3.5 - 5.1 mmol/L   Chloride 91 (L) 101 - 111 mmol/L   CO2 28 22 - 32 mmol/L   Glucose, Bld 125 (H) 65 - 99 mg/dL   BUN 10 6 - 20 mg/dL   Creatinine, Ser 0.59 (L) 0.61 - 1.24 mg/dL   Calcium 8.3 (L) 8.9 - 10.3 mg/dL   GFR calc non Af Amer >60 >60 mL/min   GFR calc Af Amer >60 >60 mL/min     Comment: (NOTE) The eGFR has been calculated using the CKD EPI equation. This calculation has not been validated in all clinical situations. eGFR's persistently <60 mL/min signify possible Chronic Kidney Disease.    Anion gap 11 5 - 15  Glucose, capillary     Status: Abnormal   Collection Time: 05/28/16  4:21 AM  Result Value Ref Range   Glucose-Capillary 133 (H) 65 - 99 mg/dL  Glucose, capillary     Status: Abnormal   Collection Time: 05/28/16  7:57 AM  Result Value Ref Range   Glucose-Capillary 102 (H) 65 - 99 mg/dL  Glucose, capillary     Status: Abnormal   Collection Time: 05/28/16 11:21 AM  Result Value Ref Range   Glucose-Capillary 102 (H) 65 - 99 mg/dL  Glucose, capillary     Status: None   Collection Time: 05/28/16  3:56 PM  Result Value Ref Range   Glucose-Capillary 86 65 - 99 mg/dL  Glucose, capillary     Status: Abnormal   Collection Time: 05/28/16  9:34 PM  Result Value Ref Range   Glucose-Capillary 113 (H) 65 - 99 mg/dL  Glucose, capillary     Status: Abnormal   Collection Time: 05/29/16 12:51 AM  Result Value Ref Range   Glucose-Capillary 181 (H) 65 - 99 mg/dL  Magnesium     Status: None   Collection Time: 05/29/16  2:45 AM  Result Value Ref Range   Magnesium 2.1 1.7 - 2.4 mg/dL  Phosphorus     Status: None   Collection Time: 05/29/16  2:45 AM  Result Value Ref Range   Phosphorus 2.8 2.5 - 4.6 mg/dL  CBC     Status: Abnormal   Collection Time: 05/29/16  2:45 AM  Result Value Ref Range   WBC 12.4 (H) 4.0 - 10.5 K/uL   RBC 4.56 4.22 - 5.81 MIL/uL   Hemoglobin 13.5 13.0 - 17.0 g/dL   HCT 38.9 (L) 39.0 - 52.0 %   MCV 85.3 78.0 - 100.0 fL   MCH 29.6 26.0 - 34.0 pg   MCHC 34.7 30.0 - 36.0 g/dL   RDW 14.4 11.5 - 15.5 %   Platelets 219 150 - 400 K/uL  Basic metabolic panel     Status: Abnormal   Collection Time: 05/29/16  2:45 AM  Result Value Ref Range   Sodium 129 (L) 135 - 145 mmol/L   Potassium 3.6 3.5 - 5.1 mmol/L   Chloride 94 (L)  101 - 111 mmol/L    CO2 28 22 - 32 mmol/L   Glucose, Bld 109 (H) 65 - 99 mg/dL   BUN 11 6 - 20 mg/dL   Creatinine, Ser 0.59 (L) 0.61 - 1.24 mg/dL   Calcium 8.4 (L) 8.9 - 10.3 mg/dL   GFR calc non Af Amer >60 >60 mL/min   GFR calc Af Amer >60 >60 mL/min    Comment: (NOTE) The eGFR has been calculated using the CKD EPI equation. This calculation has not been validated in all clinical situations. eGFR's persistently <60 mL/min signify possible Chronic Kidney Disease.    Anion gap 7 5 - 15  Glucose, capillary     Status: Abnormal   Collection Time: 05/29/16  4:32 AM  Result Value Ref Range   Glucose-Capillary 110 (H) 65 - 99 mg/dL  Glucose, capillary     Status: None   Collection Time: 05/29/16  7:56 AM  Result Value Ref Range   Glucose-Capillary 93 65 - 99 mg/dL  Glucose, capillary     Status: Abnormal   Collection Time: 05/29/16 11:06 AM  Result Value Ref Range   Glucose-Capillary 112 (H) 65 - 99 mg/dL  Glucose, capillary     Status: None   Collection Time: 05/29/16  4:32 PM  Result Value Ref Range   Glucose-Capillary 80 65 - 99 mg/dL   Comment 1 Notify RN   I-Stat arterial blood gas, ED  ([facility], [facility])     Status: Abnormal   Collection Time: 06/15/16  8:57 PM  Result Value Ref Range   pH, Arterial 7.322 (L) 7.350 - 7.450   pCO2 arterial 51.0 (H) 32.0 - 48.0 mmHg   pO2, Arterial 84.0 83.0 - 108.0 mmHg   Bicarbonate 26.6 20.0 - 28.0 mmol/L   TCO2 28 0 - 100 mmol/L   O2 Saturation 96.0 %   Acid-base deficit 1.0 0.0 - 2.0 mmol/L   Patient temperature 97.7 F    Sample type ARTERIAL   Basic metabolic panel     Status: Abnormal   Collection Time: 06/15/16  9:17 PM  Result Value Ref Range   Sodium 135 135 - 145 mmol/L   Potassium 3.8 3.5 - 5.1 mmol/L   Chloride 96 (L) 101 - 111 mmol/L   CO2 28 22 - 32 mmol/L   Glucose, Bld 109 (H) 65 - 99 mg/dL   BUN <5 (L) 6 - 20 mg/dL   Creatinine, Ser 0.72 0.61 - 1.24 mg/dL   Calcium 9.1 8.9 - 10.3 mg/dL   GFR calc non Af Amer >60 >60 mL/min   GFR calc Af  Amer >60 >60 mL/min    Comment: (NOTE) The eGFR has been calculated using the CKD EPI equation. This calculation has not been validated in all clinical situations. eGFR's persistently <60 mL/min signify possible Chronic Kidney Disease.    Anion gap 11 5 - 15  CBC     Status: None   Collection Time: 06/15/16  9:17 PM  Result Value Ref Range   WBC 6.0 4.0 - 10.5 K/uL   RBC 5.03 4.22 - 5.81 MIL/uL   Hemoglobin 14.9 13.0 - 17.0 g/dL   HCT 44.3 39.0 - 52.0 %   MCV 88.1 78.0 - 100.0 fL   MCH 29.6 26.0 - 34.0 pg   MCHC 33.6 30.0 - 36.0 g/dL   RDW 15.3 11.5 - 15.5 %   Platelets 191 150 - 400 K/uL  Brain natriuretic peptide     Status: None  Collection Time: 06/15/16  9:17 PM  Result Value Ref Range   B Natriuretic Peptide 9.5 0.0 - 100.0 pg/mL  Blood culture (routine x 2)     Status: None   Collection Time: 06/15/16  9:17 PM  Result Value Ref Range   Specimen Description BLOOD    Special Requests NONE    Culture NO GROWTH 5 DAYS    Report Status 06/21/2016 FINAL   Ammonia     Status: None   Collection Time: 06/15/16  9:17 PM  Result Value Ref Range   Ammonia 26 9 - 35 umol/L  Blood culture (routine x 2)     Status: None   Collection Time: 06/15/16  9:21 PM  Result Value Ref Range   Specimen Description BLOOD RIGHT ARM    Special Requests      BOTTLES DRAWN AEROBIC AND ANAEROBIC Blood Culture results may not be optimal due to an excessive volume of blood received in culture bottles   Culture NO GROWTH 5 DAYS    Report Status 06/21/2016 FINAL   I-stat troponin, ED  (not at [facility])     Status: None   Collection Time: 06/15/16  9:34 PM  Result Value Ref Range   Troponin i, poc 0.00 0.00 - 0.08 ng/mL   Comment 3            Comment: Due to the release kinetics of cTnI, a negative result within the first hours of the onset of symptoms does not rule out myocardial infarction with certainty. If myocardial infarction is still suspected, repeat the test at appropriate  intervals.   I-Stat CG4 Lactic Acid, ED     Status: None   Collection Time: 06/15/16  9:37 PM  Result Value Ref Range   Lactic Acid, Venous 1.80 0.5 - 1.9 mmol/L  I-Stat arterial blood gas, ED     Status: Abnormal   Collection Time: 06/15/16 11:14 PM  Result Value Ref Range   pH, Arterial 7.338 (L) 7.350 - 7.450   pCO2 arterial 51.7 (H) 32.0 - 48.0 mmHg   pO2, Arterial 86.0 83.0 - 108.0 mmHg   Bicarbonate 27.9 20.0 - 28.0 mmol/L   TCO2 30 0 - 100 mmol/L   O2 Saturation 96.0 %   Acid-Base Excess 1.0 0.0 - 2.0 mmol/L   Patient temperature 97.5 F    Sample type ARTERIAL   Ethanol     Status: None   Collection Time: 06/16/16  1:20 AM  Result Value Ref Range   Alcohol, Ethyl (B) <5 <5 mg/dL    Comment:        LOWEST DETECTABLE LIMIT FOR SERUM ALCOHOL IS 5 mg/dL FOR MEDICAL PURPOSES ONLY   Basic metabolic panel     Status: Abnormal   Collection Time: 06/16/16  4:41 AM  Result Value Ref Range   Sodium 135 135 - 145 mmol/L   Potassium 4.5 3.5 - 5.1 mmol/L   Chloride 98 (L) 101 - 111 mmol/L   CO2 25 22 - 32 mmol/L   Glucose, Bld 137 (H) 65 - 99 mg/dL   BUN <5 (L) 6 - 20 mg/dL   Creatinine, Ser 0.78 0.61 - 1.24 mg/dL   Calcium 9.3 8.9 - 10.3 mg/dL   GFR calc non Af Amer >60 >60 mL/min   GFR calc Af Amer >60 >60 mL/min    Comment: (NOTE) The eGFR has been calculated using the CKD EPI equation. This calculation has not been validated in all clinical situations. eGFR's persistently <  60 mL/min signify possible Chronic Kidney Disease.    Anion gap 12 5 - 15  Rapid urine drug screen (hospital performed)     Status: Abnormal   Collection Time: 06/16/16  8:48 AM  Result Value Ref Range   Opiates NONE DETECTED NONE DETECTED   Cocaine NONE DETECTED NONE DETECTED   Benzodiazepines POSITIVE (A) NONE DETECTED   Amphetamines NONE DETECTED NONE DETECTED   Tetrahydrocannabinol NONE DETECTED NONE DETECTED   Barbiturates NONE DETECTED NONE DETECTED    Comment:        DRUG SCREEN FOR  MEDICAL PURPOSES ONLY.  IF CONFIRMATION IS NEEDED FOR ANY PURPOSE, NOTIFY LAB WITHIN 5 DAYS.        LOWEST DETECTABLE LIMITS FOR URINE DRUG SCREEN Drug Class       Cutoff (ng/mL) Amphetamine      1000 Barbiturate      200 Benzodiazepine   474 Tricyclics       259 Opiates          300 Cocaine          300 THC              50   CBC     Status: Abnormal   Collection Time: 06/17/16  5:30 AM  Result Value Ref Range   WBC 11.1 (H) 4.0 - 10.5 K/uL   RBC 4.62 4.22 - 5.81 MIL/uL   Hemoglobin 13.6 13.0 - 17.0 g/dL   HCT 40.7 39.0 - 52.0 %   MCV 88.1 78.0 - 100.0 fL   MCH 29.4 26.0 - 34.0 pg   MCHC 33.4 30.0 - 36.0 g/dL   RDW 15.4 11.5 - 15.5 %   Platelets 224 150 - 400 K/uL  Comprehensive metabolic panel     Status: Abnormal   Collection Time: 06/17/16  5:30 AM  Result Value Ref Range   Sodium 135 135 - 145 mmol/L   Potassium 4.4 3.5 - 5.1 mmol/L   Chloride 100 (L) 101 - 111 mmol/L   CO2 25 22 - 32 mmol/L   Glucose, Bld 133 (H) 65 - 99 mg/dL   BUN 6 6 - 20 mg/dL   Creatinine, Ser 0.70 0.61 - 1.24 mg/dL   Calcium 8.9 8.9 - 10.3 mg/dL   Total Protein 6.3 (L) 6.5 - 8.1 g/dL   Albumin 3.3 (L) 3.5 - 5.0 g/dL   AST 26 15 - 41 U/L   ALT 31 17 - 63 U/L   Alkaline Phosphatase 48 38 - 126 U/L   Total Bilirubin 0.6 0.3 - 1.2 mg/dL   GFR calc non Af Amer >60 >60 mL/min   GFR calc Af Amer >60 >60 mL/min    Comment: (NOTE) The eGFR has been calculated using the CKD EPI equation. This calculation has not been validated in all clinical situations. eGFR's persistently <60 mL/min signify possible Chronic Kidney Disease.    Anion gap 10 5 - 15   No results found for: HGBA1C, MPG No results found for: PROLACTIN No results found for: CHOL, TRIG, HDL, CHOLHDL, VLDL, LDLCALC   Current Medications: Current Outpatient Prescriptions  Medication Sig Dispense Refill  . ADVAIR HFA 115-21 MCG/ACT inhaler Inhale 1 puff into the lungs 2 (two) times daily.    Marland Kitchen atorvastatin (LIPITOR) 10 MG  tablet Take 10 mg by mouth daily at 6 PM.     . benztropine (COGENTIN) 2 MG tablet Take 2 mg by mouth 3 (three) times daily.     . cetirizine (ZYRTEC)  10 MG tablet Take 10 mg by mouth at bedtime.    . Cholecalciferol (VITAMIN D PO) Take 1 tablet by mouth daily as needed (occasional).    . Cyanocobalamin (B-12 PO) Take 1 tablet by mouth daily as needed (occasional).    Marland Kitchen dextromethorphan-guaiFENesin (DIABETIC TUSSIN DM) 10-100 MG/5ML liquid Take 10 mLs by mouth every 4 (four) hours as needed for cough.    . enalapril (VASOTEC) 10 MG tablet Take 10 mg by mouth daily.    . mirtazapine (REMERON) 30 MG tablet Take 30 mg by mouth at bedtime.  0  . Multiple Vitamin (MULTIVITAMIN WITH MINERALS) TABS tablet Take 1 tablet by mouth daily.    Marland Kitchen PROAIR HFA 108 (90 BASE) MCG/ACT inhaler Inhale 1 puff into the lungs every 6 (six) hours as needed for wheezing or shortness of breath.     . risperidone (RISPERDAL) 4 MG tablet Take 4 mg by mouth 2 (two) times daily.  0  . acetaminophen (TYLENOL) 325 MG tablet Take 2 tablets (650 mg total) by mouth every 6 (six) hours as needed for mild pain, moderate pain, fever or headache (or Fever >/= 101). (Patient not taking: Reported on 07/02/2016)    . carvedilol (COREG) 3.125 MG tablet Take 3.125 mg by mouth 2 (two) times daily.  0  . montelukast (SINGULAIR) 10 MG tablet Take 10 mg by mouth daily.  0  . traZODone (DESYREL) 50 MG tablet Take 1 tablet (50 mg total) by mouth at bedtime. Take 1-2 tab at bed time 45 tablet 1   No current facility-administered medications for this visit.     Neurologic: Headache: No Seizure: No Paresthesias:No  Musculoskeletal: Strength & Muscle Tone: within normal limits Gait & Station: normal Patient leans: N/A  Psychiatric Specialty Exam: Review of Systems  Constitutional: Negative.   HENT: Negative.   Respiratory: Positive for shortness of breath.        Uses oxygen  Musculoskeletal: Negative.   Skin: Negative.   Neurological:  Negative.   Psychiatric/Behavioral: Positive for hallucinations.    Blood pressure 122/70, pulse (!) 101, height '5\' 6"'  (1.676 m), weight 140 lb 9.6 oz (63.8 kg).Body mass index is 22.69 kg/m.  General Appearance: Fairly Groomed, Guarded and Poor historian and superficially cooperative  Engineer, water:  Fair  Speech:  Slow  Volume:  Decreased  Mood:  Euthymic  Affect:  Flat  Thought Process:  Descriptions of Associations: Circumstantial  Orientation:  Full (Time, Place, and Person)  Thought Content:  Hallucinations: Auditory Hearing people's voice but no command hallucination.  Suicidal Thoughts:  No  Homicidal Thoughts:  No  Memory:  Immediate;   Fair Recent;   Poor Remote;   Poor  Judgement:  Fair  Insight:  Fair  Psychomotor Activity:  Decreased  Concentration:  Concentration: Fair and Attention Span: Fair  Recall:  Poor  Fund of Knowledge:Fair  Language: Good  Akathisia:  Mild tremors  Handed:  Right  AIMS (if indicated):  Mild tremors   Assets:  Desire for Improvement Housing Social Support  ADL's:  Intact  Cognition: Impaired,  Mild  Sleep:  Poor.     Assessment: Schizophrenia chronic paranoid type.  Insomnia.  Plan: I review his symptoms, current history, medication, psychosocial stressors and recent blood work results.  He is taking Risperdal 4 mg twice a day, Cogentin 2 mg daily and Remeron 30 mg at bedtime.  His psychotic symptoms are stable though still have hallucination but as per sister he's been hearing voices for  many years.  She does not want to change Risperdal and I agree with that.  However I will add low-dose trazodone to help the insomnia.  Patient has mild tremors which is chronic and it does not affect his functioning.  Patient is a poor historian and most of the information was obtained through electronic medical record and his sister.  Discuss medication side effects and benefits.  Patient will continue Risperdal Cogentin and Remeron from his primary  care physician.  He will also continue counseling with [name].  I recommended to call us back if he has any question, concern or feel worsening of the symptom.  Discuss safety plan that anytime having active suicidal thoughts or homicidal thought that he need to call 911 or go to local emergency room.  Follow-up in 4-6 weeks.  Bergen Magner T., MD 5/25/201811:51 AM

## 2016-08-12 DIAGNOSIS — J439 Emphysema, unspecified: Secondary | ICD-10-CM | POA: Diagnosis not present

## 2016-08-12 DIAGNOSIS — J9621 Acute and chronic respiratory failure with hypoxia: Secondary | ICD-10-CM | POA: Diagnosis not present

## 2016-08-19 DIAGNOSIS — E7800 Pure hypercholesterolemia, unspecified: Secondary | ICD-10-CM | POA: Diagnosis not present

## 2016-08-19 DIAGNOSIS — J441 Chronic obstructive pulmonary disease with (acute) exacerbation: Secondary | ICD-10-CM | POA: Diagnosis not present

## 2016-08-19 DIAGNOSIS — Z79899 Other long term (current) drug therapy: Secondary | ICD-10-CM | POA: Diagnosis not present

## 2016-08-19 DIAGNOSIS — M2550 Pain in unspecified joint: Secondary | ICD-10-CM | POA: Diagnosis not present

## 2016-08-19 DIAGNOSIS — D751 Secondary polycythemia: Secondary | ICD-10-CM | POA: Diagnosis not present

## 2016-08-19 DIAGNOSIS — M159 Polyosteoarthritis, unspecified: Secondary | ICD-10-CM | POA: Diagnosis not present

## 2016-08-19 DIAGNOSIS — J9621 Acute and chronic respiratory failure with hypoxia: Secondary | ICD-10-CM | POA: Diagnosis not present

## 2016-08-19 DIAGNOSIS — Z79891 Long term (current) use of opiate analgesic: Secondary | ICD-10-CM | POA: Diagnosis not present

## 2016-08-19 DIAGNOSIS — J439 Emphysema, unspecified: Secondary | ICD-10-CM | POA: Diagnosis not present

## 2016-08-19 DIAGNOSIS — Z125 Encounter for screening for malignant neoplasm of prostate: Secondary | ICD-10-CM | POA: Diagnosis not present

## 2016-08-19 DIAGNOSIS — K267 Chronic duodenal ulcer without hemorrhage or perforation: Secondary | ICD-10-CM | POA: Diagnosis not present

## 2016-08-19 DIAGNOSIS — I119 Hypertensive heart disease without heart failure: Secondary | ICD-10-CM | POA: Diagnosis not present

## 2016-08-19 DIAGNOSIS — F319 Bipolar disorder, unspecified: Secondary | ICD-10-CM | POA: Diagnosis not present

## 2016-08-28 DIAGNOSIS — J9621 Acute and chronic respiratory failure with hypoxia: Secondary | ICD-10-CM | POA: Diagnosis not present

## 2016-08-28 DIAGNOSIS — J439 Emphysema, unspecified: Secondary | ICD-10-CM | POA: Diagnosis not present

## 2016-09-02 DIAGNOSIS — J439 Emphysema, unspecified: Secondary | ICD-10-CM | POA: Diagnosis not present

## 2016-09-02 DIAGNOSIS — J9621 Acute and chronic respiratory failure with hypoxia: Secondary | ICD-10-CM | POA: Diagnosis not present

## 2016-09-03 ENCOUNTER — Ambulatory Visit (INDEPENDENT_AMBULATORY_CARE_PROVIDER_SITE_OTHER): Payer: Medicare Other | Admitting: Psychiatry

## 2016-09-03 ENCOUNTER — Encounter (HOSPITAL_COMMUNITY): Payer: Self-pay | Admitting: Psychiatry

## 2016-09-03 VITALS — BP 130/78 | HR 97 | Ht 66.5 in | Wt 141.2 lb

## 2016-09-03 DIAGNOSIS — G4700 Insomnia, unspecified: Secondary | ICD-10-CM

## 2016-09-03 DIAGNOSIS — F200 Paranoid schizophrenia: Secondary | ICD-10-CM | POA: Diagnosis not present

## 2016-09-03 DIAGNOSIS — F17210 Nicotine dependence, cigarettes, uncomplicated: Secondary | ICD-10-CM | POA: Diagnosis not present

## 2016-09-03 MED ORDER — TRAZODONE HCL 50 MG PO TABS: ORAL_TABLET | ORAL | 2 refills | Status: DC

## 2016-09-03 MED ORDER — BENZTROPINE MESYLATE 2 MG PO TABS: 2.0000 mg | ORAL_TABLET | Freq: Two times a day (BID) | ORAL | 2 refills | Status: DC

## 2016-09-03 MED ORDER — RISPERIDONE 4 MG PO TABS: 4.0000 mg | ORAL_TABLET | Freq: Two times a day (BID) | ORAL | 2 refills | Status: DC

## 2016-09-03 MED ORDER — MIRTAZAPINE 30 MG PO TABS: 30.0000 mg | ORAL_TABLET | Freq: Every day | ORAL | 2 refills | Status: DC

## 2016-09-03 NOTE — Progress Notes (Signed)
BH MD/PA/NP OP Progress Note  09/03/2016 12:51 PM Blake Mcknight  MRN:  [id number]  Chief Complaint:  Subjective:  I am sleeping better.  HPI: Blake Mcknight came for his follow-up appointment.  He is 61 year old African-American unemployed single man who was seen first time 4 weeks ago.  He was referred from his primary care physician because of insomnia.  We started him on trazodone which is helping his sleep.  Patient has history of schizophrenia but he has not seen psychiatrists for more than 15 years.  Recently his medicines are given by primary care physician.  Patient is a poor historian.  He continued to endorse hallucination but he is not agitated and compliant with medication.  He is a poor historian and most of them permission was obtained to rule out medical record and from his sister who came with him on appointment.  He is taking Risperdal 4 mg twice a day and Remeron 30 mg at bedtime.  He endorse some time tremors shakes and rocking back and forth but patient and his sister scared to change the medication.  I explained that he may have side effects from Risperdal but he is very reluctant to change her medication.  Patient continued to endorse hallucination but they're less intense and less frequent.  His affect is inappropriate as she is laughing and talking to himself but he is not agitated.  Patient also complaining of dry mouth and constipation.  He is taking Cogentin 2 mg 3 times a day and due to anticholinergic effect he is having side effects.  His sister is pleased that he is sleeping good.  He uses oxygen because of COPD and shortness of breath.  Patient denies any suicidal thoughts or homicidal thought.  He started seeing Blake Mcknight for counseling.  Patient usually stays to himself and does not leave the house unless it is important.  His sister is very helpful.  Patient denies any nightmares or any panic attacks.  His appetite is okay.  His vital signs are stable.  Visit Diagnosis:    ICD-10-CM   1. Paranoid schizophrenia ([place]) F20.0 mirtazapine (REMERON) 30 MG tablet    risperidone (RISPERDAL) 4 MG tablet    benztropine (COGENTIN) 2 MG tablet    traZODone (DESYREL) 50 MG tablet    Past Psychiatric History: Reviewed. Patient has history of schizophrenia and he was admitted at [facility].  At that time he was very paranoid, delusional, disorganized behavior and having mood swings.  Past Medical History:  Past Medical History:  Diagnosis Date  . COPD (chronic obstructive pulmonary disease) (HCC)    with bullous emphysema.   Marland Kitchen Heavy cigarette smoker before 2003   pt claims only 10 cigs per day, never heavier amounts.   Marland Kitchen HLD (hyperlipidemia)   . Hypertension   . Schizophrenia ([place]) 06/28/2013   This is a chronic condition and he lives with family    Past Surgical History:  Procedure Laterality Date  . CHOLECYSTECTOMY N/A 06/06/2013   Procedure: LAPAROSCOPIC CHOLECYSTECTOMY WITH ATTEMPTED INTRAOPERATIVE CHOLANGIOGRAM;  Surgeon: Zenovia Jarred, MD;  Location: [place];  Service: General;  Laterality: N/A;  . ERCP N/A 06/03/2013   Procedure: ENDOSCOPIC RETROGRADE CHOLANGIOPANCREATOGRAPHY (ERCP);  Surgeon: Ladene Artist, MD;  Location: [facility] ENDOSCOPY;  Service: Endoscopy;  Laterality: N/A;  . NO PAST SURGERIES      Family Psychiatric History: Reviewed.  Family History:  Family History  Problem Relation Age of Onset  . Diabetes Mellitus II Mother   .  Diabetes Mother   . Heart disease Father   . Stroke Maternal Aunt   . CAD Neg Hx     Social History:  Social History   Social History  . Marital status: Single    Spouse name: N/A  . Number of children: N/A  . Years of education: N/A   Social History Main Topics  . Smoking status: Current Some Day Smoker    Packs/day: 1.00    Years: 10.00    Types: Cigarettes  . Smokeless tobacco: Former Systems developer    Types: Snuff  . Alcohol use No  . Drug use: No  . Sexual activity: Yes   Other Topics  Concern  . Not on file   Social History Narrative  . No narrative on file    Allergies: No Known Allergies  Metabolic Disorder Labs: No results found for: HGBA1C, MPG No results found for: PROLACTIN No results found for: CHOL, TRIG, HDL, CHOLHDL, VLDL, LDLCALC   Current Medications: Current Outpatient Prescriptions  Medication Sig Dispense Refill  . acetaminophen (TYLENOL) 325 MG tablet Take 2 tablets (650 mg total) by mouth every 6 (six) hours as needed for mild pain, moderate pain, fever or headache (or Fever >/= 101). (Patient not taking: Reported on 07/02/2016)    . ADVAIR HFA 115-21 MCG/ACT inhaler Inhale 1 puff into the lungs 2 (two) times daily.    Marland Kitchen atorvastatin (LIPITOR) 10 MG tablet Take 10 mg by mouth daily at 6 PM.     . benztropine (COGENTIN) 2 MG tablet Take 2 mg by mouth 3 (three) times daily.     . carvedilol (COREG) 3.125 MG tablet Take 3.125 mg by mouth 2 (two) times daily.  0  . cetirizine (ZYRTEC) 10 MG tablet Take 10 mg by mouth at bedtime.    . Cholecalciferol (VITAMIN D PO) Take 1 tablet by mouth daily as needed (occasional).    . Cyanocobalamin (B-12 PO) Take 1 tablet by mouth daily as needed (occasional).    Marland Kitchen dextromethorphan-guaiFENesin (DIABETIC TUSSIN DM) 10-100 MG/5ML liquid Take 10 mLs by mouth every 4 (four) hours as needed for cough.    . enalapril (VASOTEC) 10 MG tablet Take 10 mg by mouth daily.    . mirtazapine (REMERON) 30 MG tablet Take 30 mg by mouth at bedtime.  0  . montelukast (SINGULAIR) 10 MG tablet Take 10 mg by mouth daily.  0  . Multiple Vitamin (MULTIVITAMIN WITH MINERALS) TABS tablet Take 1 tablet by mouth daily.    Marland Kitchen PROAIR HFA 108 (90 BASE) MCG/ACT inhaler Inhale 1 puff into the lungs every 6 (six) hours as needed for wheezing or shortness of breath.     . risperidone (RISPERDAL) 4 MG tablet Take 4 mg by mouth 2 (two) times daily.  0  . traZODone (DESYREL) 50 MG tablet Take 1 tablet (50 mg total) by mouth at bedtime. Take 1-2 tab at  bed time 45 tablet 1   No current facility-administered medications for this visit.     Neurologic: Headache: No Seizure: No Paresthesias: No  Musculoskeletal: Strength & Muscle Tone: within normal limits Gait & Station: normal Patient leans: N/A  Psychiatric Specialty Exam: Review of Systems  Constitutional: Negative.   Eyes: Negative.   Respiratory: Positive for shortness of breath.   Cardiovascular: Negative.   Gastrointestinal: Positive for constipation.       Dry mouth  Genitourinary: Negative.   Musculoskeletal: Negative.   Skin: Negative.  Negative for itching and rash.  Neurological: Positive  for tremors. Negative for dizziness.  Psychiatric/Behavioral: Positive for hallucinations.    Blood pressure 130/78, pulse 97, height 5' 6.5" (1.689 m), weight 141 lb 3.2 oz (64 kg).There is no height or weight on file to calculate BMI.  General Appearance: Fairly Groomed, Guarded and Superficially cooperative.  Eye Contact:  Fair  Speech:  Slow  Volume:  Decreased  Mood:  Euthymic  Affect:  Flat  Thought Process:  Descriptions of Associations: Circumstantial  Orientation:  Full (Time, Place, and Person)  Thought Content: Hallucinations: Auditory   Suicidal Thoughts:  No  Homicidal Thoughts:  No  Memory:  Immediate;   Fair Recent;   Fair Remote;   Fair  Judgement:  Fair  Insight:  Fair  Psychomotor Activity:  Decreased  Concentration:  Concentration: Fair and Attention Span: Fair  Recall:  [employer] of Knowledge: Fair  Language: Fair  Akathisia:  Yes  Handed:  Right  AIMS (if indicated):  Not done   Assets:  Desire for Improvement Housing Social Support  ADL's:  Intact  Cognition: Impaired,  Mild  Sleep:  Improved from the past     Assessment: Schizophrenia chronic paranoid type.  Insomnia.  Plan: Patient is doing much better with the trazodone.  He is only taking 50 mg at bedtime.  He is compliant with Risperdal, Remeron and Cogentin.  I recommended to  cut down Cogentin 2 mg maximum twice a day as patient is complaining of dry mouth and constipation due to anticholinergic side effects.  Encouraged to see Blake Mcknight for counseling.  Patient is still having residual symptoms of paranoia and hallucination but he is not agitated or aggressive.  Discussed medication side effects and benefits.  Discuss treatment plan with the patient and his sister.  Recommended to call us back if he has any question or any concern.  Follow-up in 3 months. Time spent 25 minutes.  More than 50% of the time spent in psychoeducation, counseling and coordination of care.  Discuss safety plan that anytime having active suicidal thoughts or homicidal thoughts then patient need to call 911 or go to the local emergency room.    Blake Karge T., MD 09/03/2016, 12:51 PM

## 2016-09-09 DIAGNOSIS — J439 Emphysema, unspecified: Secondary | ICD-10-CM | POA: Diagnosis not present

## 2016-09-09 DIAGNOSIS — J9621 Acute and chronic respiratory failure with hypoxia: Secondary | ICD-10-CM | POA: Diagnosis not present

## 2016-11-20 DIAGNOSIS — F319 Bipolar disorder, unspecified: Secondary | ICD-10-CM | POA: Diagnosis not present

## 2016-11-20 DIAGNOSIS — M2550 Pain in unspecified joint: Secondary | ICD-10-CM | POA: Diagnosis not present

## 2016-11-20 DIAGNOSIS — M159 Polyosteoarthritis, unspecified: Secondary | ICD-10-CM | POA: Diagnosis not present

## 2016-11-20 DIAGNOSIS — E7800 Pure hypercholesterolemia, unspecified: Secondary | ICD-10-CM | POA: Diagnosis not present

## 2016-11-20 DIAGNOSIS — I119 Hypertensive heart disease without heart failure: Secondary | ICD-10-CM | POA: Diagnosis not present

## 2016-11-20 DIAGNOSIS — D751 Secondary polycythemia: Secondary | ICD-10-CM | POA: Diagnosis not present

## 2016-11-20 DIAGNOSIS — Z79899 Other long term (current) drug therapy: Secondary | ICD-10-CM | POA: Diagnosis not present

## 2016-11-20 DIAGNOSIS — J441 Chronic obstructive pulmonary disease with (acute) exacerbation: Secondary | ICD-10-CM | POA: Diagnosis not present

## 2016-11-20 DIAGNOSIS — K267 Chronic duodenal ulcer without hemorrhage or perforation: Secondary | ICD-10-CM | POA: Diagnosis not present

## 2016-11-20 DIAGNOSIS — Z79891 Long term (current) use of opiate analgesic: Secondary | ICD-10-CM | POA: Diagnosis not present

## 2016-12-04 ENCOUNTER — Encounter (HOSPITAL_COMMUNITY): Payer: Self-pay | Admitting: Psychiatry

## 2016-12-04 ENCOUNTER — Ambulatory Visit (INDEPENDENT_AMBULATORY_CARE_PROVIDER_SITE_OTHER): Payer: Medicare Other | Admitting: Psychiatry

## 2016-12-04 VITALS — BP 130/78 | HR 121 | Ht 66.5 in | Wt 139.2 lb

## 2016-12-04 DIAGNOSIS — F200 Paranoid schizophrenia: Secondary | ICD-10-CM

## 2016-12-04 DIAGNOSIS — F17210 Nicotine dependence, cigarettes, uncomplicated: Secondary | ICD-10-CM | POA: Diagnosis not present

## 2016-12-04 MED ORDER — TRAZODONE HCL 50 MG PO TABS: ORAL_TABLET | ORAL | 2 refills | Status: DC

## 2016-12-04 MED ORDER — BENZTROPINE MESYLATE 2 MG PO TABS: 2.0000 mg | ORAL_TABLET | Freq: Two times a day (BID) | ORAL | 2 refills | Status: DC

## 2016-12-04 MED ORDER — RISPERIDONE 4 MG PO TABS: 4.0000 mg | ORAL_TABLET | Freq: Two times a day (BID) | ORAL | 2 refills | Status: DC

## 2016-12-04 MED ORDER — MIRTAZAPINE 30 MG PO TABS: 30.0000 mg | ORAL_TABLET | Freq: Every day | ORAL | 2 refills | Status: DC

## 2016-12-04 NOTE — Progress Notes (Signed)
BH MD/PA/NP OP Progress Note  12/04/2016 10:18 AM Blake Mcknight  MRN:  [id number]  Chief Complaint:  Chief Complaint    Follow-up     HPI: Patient came for his follow-up appointment with his sister.  He is a poor historian and most of the information was obtained through his sister.  He's been taking his medication and denies any side effects.  On his last visit we cut down his Cogentin because he was complaining of constipation and dry mouth.  His constipation got better but he still have dry mouth but he does not want to reduce his Cogentin.  He sleeping good.  He likes trazodone.  As per sister patient has been not agitated irritable and angry.  Recently patient seen his primary care physician Dr. Katherine Roan and had blood work.  Patient was told everything is normal.  Patient and his sister does not want to change the medication because they believe it is going very well.  Patient has tremors shakes and he continues to rock back and forth.  He was explained that it could be due to side effects of Risperdal but they are reluctant to try something different.  Patient appears inappropriate laughing and giggling look like this point or no stimuli but as per sister this is chronic and there has been no recent changes.  His energy level is okay.  He sleeping good and his appetite is okay.  His vital signs are stable.  Patient denies drinking alcohol or using any illegal substances.  He denies any suicidal thoughts or homicidal thought.  Patient briefly saw a therapist in this office but he is not interested in counseling in the future.  Visit Diagnosis:    ICD-10-CM   1. Paranoid schizophrenia ([place]) F20.0 traZODone (DESYREL) 50 MG tablet    risperidone (RISPERDAL) 4 MG tablet    benztropine (COGENTIN) 2 MG tablet    mirtazapine (REMERON) 30 MG tablet    Past Psychiatric History: Reviewed. Patient has history of schizophrenia and he was admitted at [facility] 25 years ago.  At that time he  was very paranoid, delusional, disorganized behavior and having mood swings.  Past Medical History:  Past Medical History:  Diagnosis Date  . COPD (chronic obstructive pulmonary disease) (HCC)    with bullous emphysema.   Marland Kitchen Heavy cigarette smoker before 2003   pt claims only 10 cigs per day, never heavier amounts.   Marland Kitchen HLD (hyperlipidemia)   . Hypertension   . Schizophrenia ([place]) 06/28/2013   This is a chronic condition and he lives with family    Past Surgical History:  Procedure Laterality Date  . CHOLECYSTECTOMY N/A 06/06/2013   Procedure: LAPAROSCOPIC CHOLECYSTECTOMY WITH ATTEMPTED INTRAOPERATIVE CHOLANGIOGRAM;  Surgeon: Zenovia Jarred, MD;  Location: [place];  Service: General;  Laterality: N/A;  . ERCP N/A 06/03/2013   Procedure: ENDOSCOPIC RETROGRADE CHOLANGIOPANCREATOGRAPHY (ERCP);  Surgeon: Ladene Artist, MD;  Location: [facility] ENDOSCOPY;  Service: Endoscopy;  Laterality: N/A;  . NO PAST SURGERIES      Family Psychiatric History: Reviewed.  Family History:  Family History  Problem Relation Age of Onset  . Diabetes Mellitus II Mother   . Diabetes Mother   . Heart disease Father   . Stroke Maternal Aunt   . CAD Neg Hx     Social History:  Social History   Social History  . Marital status: Single    Spouse name: N/A  . Number of children: N/A  . Years of education:  N/A   Social History Main Topics  . Smoking status: Current Every Day Smoker    Packs/day: 1.00    Years: 10.00    Types: Cigarettes  . Smokeless tobacco: Never Used  . Alcohol use No  . Drug use: No  . Sexual activity: Yes   Other Topics Concern  . None   Social History Narrative  . None    Allergies: No Known Allergies  Metabolic Disorder Labs: No results found for: HGBA1C, MPG No results found for: PROLACTIN No results found for: CHOL, TRIG, HDL, CHOLHDL, VLDL, LDLCALC No results found for: TSH  Therapeutic Level Labs: No results found for: LITHIUM No results found for: VALPROATE No  components found for:  CBMZ  Current Medications: Current Outpatient Prescriptions  Medication Sig Dispense Refill  . ADVAIR HFA 115-21 MCG/ACT inhaler Inhale 1 puff into the lungs 2 (two) times daily.    Marland Kitchen atorvastatin (LIPITOR) 10 MG tablet Take 10 mg by mouth daily at 6 PM.     . benztropine (COGENTIN) 2 MG tablet Take 1 tablet (2 mg total) by mouth 2 (two) times daily. 60 tablet 2  . carvedilol (COREG) 3.125 MG tablet Take 3.125 mg by mouth 2 (two) times daily.  0  . cetirizine (ZYRTEC) 10 MG tablet Take 10 mg by mouth at bedtime.    . Cholecalciferol (VITAMIN D PO) Take 1 tablet by mouth daily as needed (occasional).    . Cyanocobalamin (B-12 PO) Take 1 tablet by mouth daily as needed (occasional).    Marland Kitchen dextromethorphan-guaiFENesin (DIABETIC TUSSIN DM) 10-100 MG/5ML liquid Take 10 mLs by mouth every 4 (four) hours as needed for cough.    . enalapril (VASOTEC) 10 MG tablet Take 10 mg by mouth daily.    . mirtazapine (REMERON) 30 MG tablet Take 1 tablet (30 mg total) by mouth at bedtime. 30 tablet 2  . montelukast (SINGULAIR) 10 MG tablet Take 10 mg by mouth daily.  0  . Multiple Vitamin (MULTIVITAMIN WITH MINERALS) TABS tablet Take 1 tablet by mouth daily.    Marland Kitchen PROAIR HFA 108 (90 BASE) MCG/ACT inhaler Inhale 1 puff into the lungs every 6 (six) hours as needed for wheezing or shortness of breath.     . risperidone (RISPERDAL) 4 MG tablet Take 1 tablet (4 mg total) by mouth 2 (two) times daily. 60 tablet 2  . traZODone (DESYREL) 50 MG tablet Take 1 tab at bed time 30 tablet 2  . acetaminophen (TYLENOL) 325 MG tablet Take 2 tablets (650 mg total) by mouth every 6 (six) hours as needed for mild pain, moderate pain, fever or headache (or Fever >/= 101). (Patient not taking: Reported on 07/02/2016)     No current facility-administered medications for this visit.      Musculoskeletal: Strength & Muscle Tone: within normal limits Gait & Station: normal Patient leans: N/A  Psychiatric  Specialty Exam: ROS  Blood pressure 130/78, pulse (!) 121, height 5' 6.5" (1.689 m), weight 139 lb 3.2 oz (63.1 kg).There is no height or weight on file to calculate BMI.  General Appearance: Guarded and superfically cooperative  Eye Contact:  Fair  Speech:  Slow  Volume:  Decreased  Mood:  Anxious  Affect:  Inappropriate and Labile  Thought Process:  Descriptions of Associations: Circumstantial  Orientation:  Full (Time, Place, and Person)  Thought Content: Hallucinations: Auditory poverty of thought content and guarded   Suicidal Thoughts:  No  Homicidal Thoughts:  No  Memory:  Immediate;  Fair Recent;   Fair Remote;   Fair  Judgement:  Fair  Insight:  Good  Psychomotor Activity:  rocking  Concentration:  Concentration: Fair and Attention Span: Fair  Recall:  [employer] of Knowledge: Fair  Language: Fair  Akathisia:  No  Handed:  Right  AIMS (if indicated): not done  Assets:  Desire for Improvement Housing Social Support  ADL's:  Intact  Cognition: Impaired,  Mild  Sleep:  Good   Screenings:   Assessment and Plan: Schizophrenia chronic paranoid type.  Patient is a stable on his medication.  He has tremors, dry mouth which could be due to side effects of the medication but he does not want to change or try a different medication.  He is also not interested in counseling.  I will continue Risperdal 4 mg twice a day, Cogentin 2 mg twice a day and trazodone 50 mg at bedtime.  We will get his consent and records from recent visit to the primary care physician.  I recommended to call us back if symptoms started to get worse.  Follow-up in 3 months.   ARFEEN,SYED T., MD 12/04/2016, 10:18 AM

## 2017-01-27 DIAGNOSIS — M2550 Pain in unspecified joint: Secondary | ICD-10-CM | POA: Diagnosis not present

## 2017-01-27 DIAGNOSIS — J441 Chronic obstructive pulmonary disease with (acute) exacerbation: Secondary | ICD-10-CM | POA: Diagnosis not present

## 2017-01-27 DIAGNOSIS — K267 Chronic duodenal ulcer without hemorrhage or perforation: Secondary | ICD-10-CM | POA: Diagnosis not present

## 2017-01-27 DIAGNOSIS — Z79891 Long term (current) use of opiate analgesic: Secondary | ICD-10-CM | POA: Diagnosis not present

## 2017-01-27 DIAGNOSIS — M1990 Unspecified osteoarthritis, unspecified site: Secondary | ICD-10-CM | POA: Diagnosis not present

## 2017-01-27 DIAGNOSIS — I119 Hypertensive heart disease without heart failure: Secondary | ICD-10-CM | POA: Diagnosis not present

## 2017-01-27 DIAGNOSIS — D751 Secondary polycythemia: Secondary | ICD-10-CM | POA: Diagnosis not present

## 2017-01-27 DIAGNOSIS — F319 Bipolar disorder, unspecified: Secondary | ICD-10-CM | POA: Diagnosis not present

## 2017-01-27 DIAGNOSIS — Z79899 Other long term (current) drug therapy: Secondary | ICD-10-CM | POA: Diagnosis not present

## 2017-01-27 DIAGNOSIS — E7800 Pure hypercholesterolemia, unspecified: Secondary | ICD-10-CM | POA: Diagnosis not present

## 2017-03-05 ENCOUNTER — Ambulatory Visit (HOSPITAL_COMMUNITY): Payer: Self-pay | Admitting: Psychiatry

## 2017-03-12 ENCOUNTER — Telehealth (HOSPITAL_COMMUNITY): Payer: Self-pay

## 2017-03-12 DIAGNOSIS — F200 Paranoid schizophrenia: Secondary | ICD-10-CM

## 2017-03-12 NOTE — Telephone Encounter (Signed)
yes

## 2017-03-12 NOTE — Telephone Encounter (Signed)
Medication refill request - Faxes received from patient's [facility] for refills of his Trazodone, Cogentin nad Risperdal, last ordered 12/04/16 + 2 refills.  Patient cancelled 03/05/17 and is rescheduled for 04/14/17.

## 2017-03-13 MED ORDER — TRAZODONE HCL 50 MG PO TABS: ORAL_TABLET | ORAL | 0 refills | Status: DC

## 2017-03-13 MED ORDER — RISPERIDONE 4 MG PO TABS: 4.0000 mg | ORAL_TABLET | Freq: Two times a day (BID) | ORAL | 0 refills | Status: DC

## 2017-03-13 MED ORDER — BENZTROPINE MESYLATE 2 MG PO TABS: 2.0000 mg | ORAL_TABLET | Freq: Two times a day (BID) | ORAL | 0 refills | Status: DC

## 2017-03-13 NOTE — Telephone Encounter (Signed)
New 30 day orders of patient's prescribed Risperdal, Trazodone and Cogentin all e-scribed to patient's [facility] per Dr. Doyne Keel order with no refills as patient is set to return to see Dr. Adele Schilder on 04/14/17.

## 2017-03-16 ENCOUNTER — Other Ambulatory Visit (HOSPITAL_COMMUNITY): Payer: Self-pay

## 2017-03-16 DIAGNOSIS — F200 Paranoid schizophrenia: Secondary | ICD-10-CM

## 2017-03-16 MED ORDER — MIRTAZAPINE 30 MG PO TABS: 30.0000 mg | ORAL_TABLET | Freq: Every day | ORAL | 0 refills | Status: DC

## 2017-04-14 ENCOUNTER — Encounter (HOSPITAL_COMMUNITY): Payer: Self-pay | Admitting: Psychiatry

## 2017-04-14 ENCOUNTER — Ambulatory Visit (INDEPENDENT_AMBULATORY_CARE_PROVIDER_SITE_OTHER): Payer: Medicare Other | Admitting: Psychiatry

## 2017-04-14 DIAGNOSIS — F200 Paranoid schizophrenia: Secondary | ICD-10-CM | POA: Diagnosis not present

## 2017-04-14 DIAGNOSIS — F17210 Nicotine dependence, cigarettes, uncomplicated: Secondary | ICD-10-CM | POA: Diagnosis not present

## 2017-04-14 MED ORDER — RISPERIDONE 4 MG PO TABS: 4.0000 mg | ORAL_TABLET | Freq: Two times a day (BID) | ORAL | 0 refills | Status: DC

## 2017-04-14 MED ORDER — BENZTROPINE MESYLATE 2 MG PO TABS: 2.0000 mg | ORAL_TABLET | Freq: Two times a day (BID) | ORAL | 0 refills | Status: DC

## 2017-04-14 MED ORDER — TRAZODONE HCL 50 MG PO TABS: ORAL_TABLET | ORAL | 0 refills | Status: DC

## 2017-04-14 MED ORDER — MIRTAZAPINE 30 MG PO TABS: 30.0000 mg | ORAL_TABLET | Freq: Every day | ORAL | 0 refills | Status: DC

## 2017-04-14 NOTE — Progress Notes (Signed)
[facility] MD/PA/NP OP Progress Note  04/14/2017 4:30 PM CLEBURNE SAVINI  MRN:  [id number]  Chief Complaint: I am sleeping good.  I do not take sleep medication every night.  HPI: Patient came for his follow-up appointment with his sister.  He is a poor historian and most of the information was obtained through his sister.  As per sister he is taking Risperdal Remeron and Cogentin.  He takes trazodone only if he cannot sleep.  He denies any irritability, mania, agitation, aggressive behavior.  His sister endorsed that his behavior is much better.  He is seeing Dr. Katherine Roan next week for blood work.  I have told that primary care physician should fax [place] the results of the blood work.  Patient appears sometimes giggling and laughing to himself.  However there were no agitation.  Patient denies any hallucination or any paranoia.  He denies drinking alcohol or using any illegal substances.  He mention his Christmas was quite and he ate too much.  Patient is not interested in counseling.  Visit Diagnosis:    ICD-10-CM   1. Paranoid schizophrenia ([place]) F20.0 traZODone (DESYREL) 50 MG tablet    mirtazapine (REMERON) 30 MG tablet    risperidone (RISPERDAL) 4 MG tablet    benztropine (COGENTIN) 2 MG tablet    Past Psychiatric History: Reviewed. Patient has history of schizophrenia and he was admitted at [facility] 25 years ago. At that time he was very paranoid, delusional, disorganized behavior and having mood swings.  Past Medical History:  Past Medical History:  Diagnosis Date  . COPD (chronic obstructive pulmonary disease) (HCC)    with bullous emphysema.   Marland Kitchen Heavy cigarette smoker before 2003   pt claims only 10 cigs per day, never heavier amounts.   Marland Kitchen HLD (hyperlipidemia)   . Hypertension   . Schizophrenia ([place]) 06/28/2013   This is a chronic condition and he lives with family    Past Surgical History:  Procedure Laterality Date  . CHOLECYSTECTOMY N/A 06/06/2013   Procedure:  LAPAROSCOPIC CHOLECYSTECTOMY WITH ATTEMPTED INTRAOPERATIVE CHOLANGIOGRAM;  Surgeon: Zenovia Jarred, MD;  Location: [place];  Service: General;  Laterality: N/A;  . ERCP N/A 06/03/2013   Procedure: ENDOSCOPIC RETROGRADE CHOLANGIOPANCREATOGRAPHY (ERCP);  Surgeon: Ladene Artist, MD;  Location: [facility] ENDOSCOPY;  Service: Endoscopy;  Laterality: N/A;  . NO PAST SURGERIES      Family Psychiatric History: Viewed.  Family History:  Family History  Problem Relation Age of Onset  . Diabetes Mellitus II Mother   . Diabetes Mother   . Heart disease Father   . Stroke Maternal Aunt   . CAD Neg Hx     Social History:  Social History   Socioeconomic History  . Marital status: Single    Spouse name: Not on file  . Number of children: Not on file  . Years of education: Not on file  . Highest education level: Not on file  Social Needs  . Financial resource strain: Not on file  . Food insecurity - worry: Not on file  . Food insecurity - inability: Not on file  . Transportation needs - medical: Not on file  . Transportation needs - non-medical: Not on file  Occupational History  . Not on file  Tobacco Use  . Smoking status: Current Every Day Smoker    Packs/day: 1.00    Years: 10.00    Pack years: 10.00    Types: Cigarettes  . Smokeless tobacco: Never Used  Substance and Sexual  Activity  . Alcohol use: No  . Drug use: No  . Sexual activity: Yes  Other Topics Concern  . Not on file  Social History Narrative  . Not on file    Allergies: No Known Allergies  Metabolic Disorder Labs: No results found for: HGBA1C, MPG No results found for: PROLACTIN No results found for: CHOL, TRIG, HDL, CHOLHDL, VLDL, LDLCALC No results found for: TSH  Therapeutic Level Labs: No results found for: LITHIUM No results found for: VALPROATE No components found for:  CBMZ  Current Medications: Current Outpatient Medications  Medication Sig Dispense Refill  . acetaminophen (TYLENOL) 325 MG tablet  Take 2 tablets (650 mg total) by mouth every 6 (six) hours as needed for mild pain, moderate pain, fever or headache (or Fever >/= 101). (Patient not taking: Reported on 07/02/2016)    . ADVAIR HFA 115-21 MCG/ACT inhaler Inhale 1 puff into the lungs 2 (two) times daily.    Marland Kitchen atorvastatin (LIPITOR) 10 MG tablet Take 10 mg by mouth daily at 6 PM.     . benztropine (COGENTIN) 2 MG tablet Take 1 tablet (2 mg total) by mouth 2 (two) times daily. 60 tablet 0  . carvedilol (COREG) 3.125 MG tablet Take 3.125 mg by mouth 2 (two) times daily.  0  . cetirizine (ZYRTEC) 10 MG tablet Take 10 mg by mouth at bedtime.    . Cholecalciferol (VITAMIN D PO) Take 1 tablet by mouth daily as needed (occasional).    . Cyanocobalamin (B-12 PO) Take 1 tablet by mouth daily as needed (occasional).    Marland Kitchen dextromethorphan-guaiFENesin (DIABETIC TUSSIN DM) 10-100 MG/5ML liquid Take 10 mLs by mouth every 4 (four) hours as needed for cough.    . enalapril (VASOTEC) 10 MG tablet Take 10 mg by mouth daily.    . mirtazapine (REMERON) 30 MG tablet Take 1 tablet (30 mg total) by mouth at bedtime. 30 tablet 0  . montelukast (SINGULAIR) 10 MG tablet Take 10 mg by mouth daily.  0  . Multiple Vitamin (MULTIVITAMIN WITH MINERALS) TABS tablet Take 1 tablet by mouth daily.    Marland Kitchen PROAIR HFA 108 (90 BASE) MCG/ACT inhaler Inhale 1 puff into the lungs every 6 (six) hours as needed for wheezing or shortness of breath.     . risperidone (RISPERDAL) 4 MG tablet Take 1 tablet (4 mg total) by mouth 2 (two) times daily. 60 tablet 0  . traZODone (DESYREL) 50 MG tablet Take 1 tab at bed time 30 tablet 0   No current facility-administered medications for this visit.      Musculoskeletal: Strength & Muscle Tone: within normal limits Gait & Station: normal Patient leans: N/A  Psychiatric Specialty Exam: ROS  Blood pressure 130/82, pulse (!) 113, height 5\' 8"  (1.727 m), weight 140 lb (63.5 kg).Body mass index is 21.29 kg/m.  General Appearance:  Casual and superfically cooperative  Eye Contact:  Fair  Speech:  Slow  Volume:  Decreased  Mood:  Anxious  Affect:  Constricted  Thought Process:  Descriptions of Associations: Circumstantial  Orientation:  Full (Time, Place, and Person)  Thought Content: poverty of thought content   Suicidal Thoughts:  No  Homicidal Thoughts:  No  Memory:  Immediate;   Fair Recent;   Fair Remote;   Fair  Judgement:  Fair  Insight:  Good  Psychomotor Activity:  rocking  Concentration:  Concentration: Fair and Attention Span: Fair  Recall:  [employer] of Knowledge: Fair  Language: Fair  Akathisia:  No  Handed:  Right  AIMS (if indicated): not done  Assets:  Desire for Improvement Housing Social Support  ADL's:  Intact  Cognition: Impaired,  Mild  Sleep:  Good   Screenings:   Assessment and Plan: Schizophrenia chronic paranoid type.  Patient is a stable on his current psychiatric medication.  He has mild tremors but overall he is tolerating his medication without any side effects.  Continue Risperdal 4 mg twice a day, Remeron 30 mg at bedtime, Cogentin 2 mg twice a day and trazodone 50 mg at bedtime.  More time encouraged to have his blood work results faxed to [place] when he see his primary care physician.  Recommended to call us back if is any question or any concern.  Follow-up in 3 months.   Kathlee Nations, MD 04/14/2017, 4:30 PM

## 2017-04-21 DIAGNOSIS — Z79899 Other long term (current) drug therapy: Secondary | ICD-10-CM | POA: Diagnosis not present

## 2017-04-21 DIAGNOSIS — K210 Gastro-esophageal reflux disease with esophagitis: Secondary | ICD-10-CM | POA: Diagnosis not present

## 2017-04-21 DIAGNOSIS — F319 Bipolar disorder, unspecified: Secondary | ICD-10-CM | POA: Diagnosis not present

## 2017-04-21 DIAGNOSIS — D751 Secondary polycythemia: Secondary | ICD-10-CM | POA: Diagnosis not present

## 2017-04-21 DIAGNOSIS — Z79891 Long term (current) use of opiate analgesic: Secondary | ICD-10-CM | POA: Diagnosis not present

## 2017-04-21 DIAGNOSIS — K267 Chronic duodenal ulcer without hemorrhage or perforation: Secondary | ICD-10-CM | POA: Diagnosis not present

## 2017-04-21 DIAGNOSIS — M159 Polyosteoarthritis, unspecified: Secondary | ICD-10-CM | POA: Diagnosis not present

## 2017-04-21 DIAGNOSIS — E7800 Pure hypercholesterolemia, unspecified: Secondary | ICD-10-CM | POA: Diagnosis not present

## 2017-04-21 DIAGNOSIS — J441 Chronic obstructive pulmonary disease with (acute) exacerbation: Secondary | ICD-10-CM | POA: Diagnosis not present

## 2017-04-21 DIAGNOSIS — I119 Hypertensive heart disease without heart failure: Secondary | ICD-10-CM | POA: Diagnosis not present

## 2017-04-21 DIAGNOSIS — M2550 Pain in unspecified joint: Secondary | ICD-10-CM | POA: Diagnosis not present

## 2017-07-07 ENCOUNTER — Ambulatory Visit (HOSPITAL_COMMUNITY)
Admission: EM | Admit: 2017-07-07 | Discharge: 2017-07-07 | Disposition: A | Discharge status: Home / Self Care | Payer: Medicare Other | Attending: Family Medicine | Admitting: Family Medicine

## 2017-07-07 ENCOUNTER — Ambulatory Visit (HOSPITAL_COMMUNITY): Payer: Medicare Other

## 2017-07-07 ENCOUNTER — Encounter (HOSPITAL_COMMUNITY): Payer: Self-pay | Admitting: Emergency Medicine

## 2017-07-07 ENCOUNTER — Other Ambulatory Visit: Payer: Self-pay

## 2017-07-07 ENCOUNTER — Ambulatory Visit (INDEPENDENT_AMBULATORY_CARE_PROVIDER_SITE_OTHER): Payer: Medicare Other

## 2017-07-07 DIAGNOSIS — J441 Chronic obstructive pulmonary disease with (acute) exacerbation: Secondary | ICD-10-CM | POA: Diagnosis not present

## 2017-07-07 DIAGNOSIS — R05 Cough: Secondary | ICD-10-CM | POA: Diagnosis not present

## 2017-07-07 DIAGNOSIS — R0602 Shortness of breath: Secondary | ICD-10-CM

## 2017-07-07 MED ORDER — IPRATROPIUM-ALBUTEROL 0.5-2.5 (3) MG/3ML IN SOLN
3.0000 mL | Freq: Once | RESPIRATORY_TRACT | Status: AC
  Administered 2017-07-07: 3 mL via RESPIRATORY_TRACT

## 2017-07-07 MED ORDER — PREDNISONE 50 MG PO TABS: 50.0000 mg | ORAL_TABLET | Freq: Every day | ORAL | 0 refills | Status: AC

## 2017-07-07 MED ORDER — ALBUTEROL SULFATE HFA 108 (90 BASE) MCG/ACT IN AERS: 1.0000 | INHALATION_SPRAY | Freq: Four times a day (QID) | RESPIRATORY_TRACT | 0 refills | Status: DC | PRN

## 2017-07-07 MED ORDER — AZITHROMYCIN 250 MG PO TABS: 250.0000 mg | ORAL_TABLET | Freq: Every day | ORAL | 0 refills | Status: AC

## 2017-07-07 MED ORDER — IPRATROPIUM-ALBUTEROL 0.5-2.5 (3) MG/3ML IN SOLN
RESPIRATORY_TRACT | Status: AC
  Filled 2017-07-07: qty 3

## 2017-07-07 NOTE — Discharge Instructions (Addendum)
Please begin azithromycin, 2 tablets today, 1 tablet for the following 4 days  Prednisone daily for 5 days  Albuterol/Proair inhaler for shortness of breath, wheezing every 6 hours  Go to emergency room if symptoms worsening, decreased oxygen level, altered mental status

## 2017-07-07 NOTE — ED Triage Notes (Signed)
Having productive cough and SOB onset 7-10 days

## 2017-07-07 NOTE — ED Provider Notes (Signed)
[place]    CSN: [id number] Arrival date & time: 07/07/17  [place]     History   Chief Complaint Chief Complaint  Patient presents with  . Cough    HPI Blake Mcknight is a 62 y.o. male history of COPD, heavy tobacco abuse, hypertension, schizophrenia presenting today for evaluation of worsening shortness of breath and productive cough.  Symptoms have been worsening for the last 7-10 days.  His wife is here with him and concerned about possible pneumonia.  Patient was hospitalized approximately 1 year ago for altered mental status related to COPD exacerbation.  Denies any change in baseline from normal today.  Denies any fevers.  Is also having some rhinorrhea and congestion.  Denies sore throat.  Denies nausea, vomiting, abdominal pain.  Patient uses Advair inhaler daily, but does not have albuterol inhaler at home.  HPI  Past Medical History:  Diagnosis Date  . COPD (chronic obstructive pulmonary disease) (HCC)    with bullous emphysema.   Marland Kitchen Heavy cigarette smoker before 2003   pt claims only 10 cigs per day, never heavier amounts.   Marland Kitchen HLD (hyperlipidemia)   . Hypertension   . Schizophrenia ([place]) 06/28/2013   This is a chronic condition and he lives with family    Patient Active Problem List   Diagnosis Date Noted  . Acute respiratory failure with hypercapnia ([place]) 06/16/2016  . Acute respiratory failure with hypoxia ([place]) 06/16/2016  . Acute on chronic respiratory failure with hypoxia ([place]) 05/25/2016  . Altered mental status 09/22/2015  . Hyponatremia 09/22/2015  . Elevated lactic acid level 09/22/2015  . Acute encephalopathy   . Bullous emphysema ([place]) 07/26/2013  . Schizophrenia ([place]) 06/28/2013  . Tobacco use disorder 06/06/2013  . Abdominal pain 06/03/2013  . Sepsis ([place]) 06/03/2013  . Elevated LFTs 06/03/2013  . HTN (hypertension) 06/03/2013  . HLD (hyperlipidemia) 06/03/2013  . Chronic obstructive pulmonary disease with acute exacerbation  ([place]) 06/03/2013  . Calculus of bile duct without mention of cholecystitis or obstruction 06/03/2013    Past Surgical History:  Procedure Laterality Date  . CHOLECYSTECTOMY N/A 06/06/2013   Procedure: LAPAROSCOPIC CHOLECYSTECTOMY WITH ATTEMPTED INTRAOPERATIVE CHOLANGIOGRAM;  Surgeon: Zenovia Jarred, MD;  Location: [place];  Service: General;  Laterality: N/A;  . ERCP N/A 06/03/2013   Procedure: ENDOSCOPIC RETROGRADE CHOLANGIOPANCREATOGRAPHY (ERCP);  Surgeon: Ladene Artist, MD;  Location: [facility] ENDOSCOPY;  Service: Endoscopy;  Laterality: N/A;  . NO PAST SURGERIES         Home Medications    Prior to Admission medications   Medication Sig Start Date End Date Taking? Authorizing Provider  acetaminophen (TYLENOL) 325 MG tablet Take 2 tablets (650 mg total) by mouth every 6 (six) hours as needed for mild pain, moderate pain, fever or headache (or Fever >/= 101). Patient not taking: Reported on 07/02/2016 06/18/16   Modena Jansky, MD  ADVAIR [facility] 115-21 MCG/ACT inhaler Inhale 1 puff into the lungs 2 (two) times daily. 09/21/15   [provider]  albuterol (PROVENTIL HFA;VENTOLIN HFA) 108 (90 Base) MCG/ACT inhaler Inhale 1-2 puffs into the lungs every 6 (six) hours as needed for wheezing or shortness of breath. 07/07/17   Kellin Bartling C, PA-C  atorvastatin (LIPITOR) 10 MG tablet Take 10 mg by mouth daily at 6 PM.  09/21/15   [provider]  azithromycin (ZITHROMAX) 250 MG tablet Take 1 tablet (250 mg total) by mouth daily for 5 days. Take first 2 tablets together, then 1 every day  until finished. 07/07/17 07/12/17  Jettie Lazare C, PA-C  benztropine (COGENTIN) 2 MG tablet Take 1 tablet (2 mg total) by mouth 2 (two) times daily. 04/14/17   Arfeen, Arlyce Harman, MD  carvedilol (COREG) 3.125 MG tablet Take 3.125 mg by mouth 2 (two) times daily. 07/27/16   [provider]  cetirizine (ZYRTEC) 10 MG tablet Take 10 mg by mouth at bedtime.    [provider]  Cholecalciferol  (VITAMIN D PO) Take 1 tablet by mouth daily as needed (occasional).    [provider]  Cyanocobalamin (B-12 PO) Take 1 tablet by mouth daily as needed (occasional).    [provider]  dextromethorphan-guaiFENesin (DIABETIC TUSSIN DM) 10-100 MG/5ML liquid Take 10 mLs by mouth every 4 (four) hours as needed for cough.    [provider]  enalapril (VASOTEC) 10 MG tablet Take 10 mg by mouth daily. 06/04/16   [provider]  mirtazapine (REMERON) 30 MG tablet Take 1 tablet (30 mg total) by mouth at bedtime. 04/14/17 04/14/18  Arfeen, Arlyce Harman, MD  montelukast (SINGULAIR) 10 MG tablet Take 10 mg by mouth daily. 07/24/16   [provider]  Multiple Vitamin (MULTIVITAMIN WITH MINERALS) TABS tablet Take 1 tablet by mouth daily.    [provider]  predniSONE (DELTASONE) 50 MG tablet Take 1 tablet (50 mg total) by mouth daily for 5 days. 07/07/17 07/12/17  Manaal Mandala C, PA-C  risperidone (RISPERDAL) 4 MG tablet Take 1 tablet (4 mg total) by mouth 2 (two) times daily. 04/14/17   Kathlee Nations, MD  traZODone (DESYREL) 50 MG tablet Take 1 tab at bed time 04/14/17   Arfeen, Arlyce Harman, MD    Family History Family History  Problem Relation Age of Onset  . Diabetes Mellitus II Mother   . Diabetes Mother   . Heart disease Father   . Stroke Maternal Aunt   . CAD Neg Hx     Social History Social History   Tobacco Use  . Smoking status: Current Every Day Smoker    Packs/day: 1.00    Years: 10.00    Pack years: 10.00    Types: Cigarettes  . Smokeless tobacco: Never Used  Substance Use Topics  . Alcohol use: No  . Drug use: No     Allergies   Patient has no known allergies.   Review of Systems Review of Systems  Constitutional: Negative for activity change, appetite change, fatigue and fever.  HENT: Positive for congestion and rhinorrhea. Negative for ear pain, postnasal drip, sinus pressure and sore throat.   Eyes: Negative for pain and  itching.  Respiratory: Positive for cough, chest tightness, shortness of breath and wheezing.   Cardiovascular: Negative for chest pain.  Gastrointestinal: Negative for abdominal pain, diarrhea, nausea and vomiting.  Musculoskeletal: Negative for myalgias.  Skin: Negative for rash.  Neurological: Negative for dizziness, light-headedness and headaches.     Physical Exam Triage Vital Signs ED Triage Vitals  Enc Vitals Group     BP 07/07/17 1844 121/81     Pulse Rate 07/07/17 1844 95     Resp 07/07/17 1844 16     Temp 07/07/17 1844 97.7 F (36.5 C)     Temp Source 07/07/17 1844 Oral     SpO2 07/07/17 1844 95 %     Weight --      Height --      Head Circumference --      Peak Flow --  Pain Score 07/07/17 1851 10     Pain Loc --      Pain Edu? --      Excl. in [place]? --    No data found.  Updated Vital Signs BP 121/81 (BP Location: Left Arm)   Pulse 95   Temp 97.7 F (36.5 C) (Oral)   Resp 16   SpO2 95%   Visual Acuity Right Eye Distance:   Left Eye Distance:   Bilateral Distance:    Right Eye Near:   Left Eye Near:    Bilateral Near:     Physical Exam  Constitutional: He is oriented to person, place, and time. He appears well-developed and well-nourished.  Sitting comfortably on exam table, cooperative with exam  HENT:  Head: Normocephalic and atraumatic.  Eyes: Conjunctivae are normal.  Neck: Neck supple.  Cardiovascular: Normal rate and regular rhythm.  No murmur heard. Pulmonary/Chest: Effort normal and breath sounds normal. No respiratory distress.  Mild wheezing to bilateral lung fields diffusely throughout.  Slightly improved with breathing treatment.  Abdominal: Soft. There is no tenderness.  Musculoskeletal: He exhibits no edema.  Neurological: He is alert and oriented to person, place, and time.  Patient is alert and oriented, but mentation does seem slightly off, but wife states this is his baseline  Skin: Skin is warm and dry.  Psychiatric: He  has a normal mood and affect.  Nursing note and vitals reviewed.    UC Treatments / Results  Labs (all labs ordered are listed, but only abnormal results are displayed) Labs Reviewed - No data to display  EKG None Radiology Dg Chest 2 View  Result Date: 07/07/2017 CLINICAL DATA:  Cough for 1 week EXAM: CHEST - 2 VIEW COMPARISON:  06/15/2016, 05/28/2016, CT chest 05/25/2016 FINDINGS: Extensive bullous emphysematous disease with hyperinflation. Increased interstitial thickening compared to prior. Small focus of airspace disease in the medial left lung base. Cardiomediastinal silhouette within normal limits. No pneumothorax. IMPRESSION: Extensive bullous emphysematous disease. Increased interstitial thickening compared to prior suggesting acute inflammatory process on chronic disease. Mild focus of airspace disease in the medial left base may reflect a small infiltrate. Electronically Signed   By: Donavan Foil M.D.   On: 07/07/2017 19:32    Procedures Procedures (including critical care time)  Medications Ordered in UC Medications  ipratropium-albuterol (DUONEB) 0.5-2.5 (3) MG/3ML nebulizer solution 3 mL (3 mLs Nebulization Given 07/07/17 1911)     Initial Impression / Assessment and Plan / UC Course  I have reviewed the triage vital signs and the nursing notes.  Pertinent labs & imaging results that were available during my care of the patient were reviewed by me and considered in my medical decision making (see chart for details).     Patient with severe emphysema, worsening shortness of breath, DuoNeb provided in urgent care today.  Chest x-ray showing severe bullous emphysematous disease, acute on chronic inflammation as well as possible small infiltrate in left base..  Will send home with azithromycin, prednisone as well as albuterol inhaler as he does not have this at home.  Discussed with wife and patient to go to emergency room if he has worsening shortness of breath, oxygen  level monitoring at home and to go to emergency room if decreasing below 93%, change in mental status. Discussed strict return precautions. Patient verbalized understanding and is agreeable with plan.   Final Clinical Impressions(s) / UC Diagnoses   Final diagnoses:  COPD exacerbation [facility])    ED Discharge Orders  Ordered    albuterol (PROVENTIL HFA;VENTOLIN HFA) 108 (90 Base) MCG/ACT inhaler  Every 6 hours PRN     07/07/17 1925    predniSONE (DELTASONE) 50 MG tablet  Daily     07/07/17 1925    azithromycin (ZITHROMAX) 250 MG tablet  Daily     07/07/17 1925       Controlled Substance Prescriptions [place] Controlled Substance Registry consulted? Not Applicable   Janith Lima, [place] 07/07/17 1947

## 2017-07-14 ENCOUNTER — Ambulatory Visit (HOSPITAL_COMMUNITY): Payer: Self-pay | Admitting: Psychiatry

## 2017-07-16 ENCOUNTER — Encounter (HOSPITAL_COMMUNITY): Payer: Self-pay

## 2017-07-16 ENCOUNTER — Emergency Department (HOSPITAL_COMMUNITY): Payer: Medicare Other

## 2017-07-16 ENCOUNTER — Ambulatory Visit (HOSPITAL_COMMUNITY): Payer: Medicare Other | Admitting: Psychiatry

## 2017-07-16 ENCOUNTER — Emergency Department (HOSPITAL_COMMUNITY)
Admission: EM | Admit: 2017-07-16 | Discharge: 2017-07-17 | Disposition: A | Discharge status: Home / Self Care | Payer: Medicare Other | Attending: Emergency Medicine | Admitting: Emergency Medicine

## 2017-07-16 ENCOUNTER — Other Ambulatory Visit: Payer: Self-pay

## 2017-07-16 DIAGNOSIS — J441 Chronic obstructive pulmonary disease with (acute) exacerbation: Secondary | ICD-10-CM

## 2017-07-16 DIAGNOSIS — Z79899 Other long term (current) drug therapy: Secondary | ICD-10-CM | POA: Insufficient documentation

## 2017-07-16 DIAGNOSIS — F17210 Nicotine dependence, cigarettes, uncomplicated: Secondary | ICD-10-CM | POA: Diagnosis not present

## 2017-07-16 DIAGNOSIS — Z9981 Dependence on supplemental oxygen: Secondary | ICD-10-CM | POA: Insufficient documentation

## 2017-07-16 DIAGNOSIS — R0602 Shortness of breath: Secondary | ICD-10-CM | POA: Diagnosis not present

## 2017-07-16 DIAGNOSIS — I10 Essential (primary) hypertension: Secondary | ICD-10-CM | POA: Insufficient documentation

## 2017-07-16 MED ORDER — ALBUTEROL SULFATE (2.5 MG/3ML) 0.083% IN NEBU: 2.5000 mg | INHALATION_SOLUTION | Freq: Four times a day (QID) | RESPIRATORY_TRACT | 12 refills | Status: DC | PRN

## 2017-07-16 MED ORDER — IPRATROPIUM-ALBUTEROL 0.5-2.5 (3) MG/3ML IN SOLN
3.0000 mL | Freq: Once | RESPIRATORY_TRACT | Status: AC
  Administered 2017-07-16: 3 mL via RESPIRATORY_TRACT
  Filled 2017-07-16: qty 3

## 2017-07-16 MED ORDER — PREDNISONE 20 MG PO TABS
60.0000 mg | ORAL_TABLET | Freq: Once | ORAL | Status: AC
  Administered 2017-07-16: 60 mg via ORAL
  Filled 2017-07-16: qty 3

## 2017-07-16 MED ORDER — ALBUTEROL SULFATE (2.5 MG/3ML) 0.083% IN NEBU
5.0000 mg | INHALATION_SOLUTION | Freq: Once | RESPIRATORY_TRACT | Status: AC
  Administered 2017-07-16: 5 mg via RESPIRATORY_TRACT
  Filled 2017-07-16: qty 6

## 2017-07-16 NOTE — ED Provider Notes (Signed)
[place] EMERGENCY DEPARTMENT Provider Note   CSN: [id number] Arrival date & time: 07/16/17  1327     History   Chief Complaint Chief Complaint  Patient presents with  . Shortness of Breath    HPI Blake Mcknight is a 62 y.o. male.  62 year old male with a history of bullous emphysema (on chronic 2L oxygen as needed), hypertension, dyslipidemia, schizophrenia, tobacco abuse presents to the emergency department for worsening shortness of breath.  Patient was seen 1 week ago for shortness of breath symptoms at urgent care.  He was discharged on a course of azithromycin as well as prednisone which the patient completed 3 days ago.  Wife reports worsening shortness of breath despite continued home albuterol and Advair use for the past 24 hours.  He is supposed to be on chronic 2 L oxygen, but uses this more inconsistently.  Cough noted productive of clear sputum.  Patient denies chest pain, nausea, vomiting.  He has not had any associated fevers.  PCP - Dr. Katherine Roan     Past Medical History:  Diagnosis Date  . COPD (chronic obstructive pulmonary disease) (HCC)    with bullous emphysema.   Marland Kitchen Heavy cigarette smoker before 2003   pt claims only 10 cigs per day, never heavier amounts.   Marland Kitchen HLD (hyperlipidemia)   . Hypertension   . Schizophrenia ([place]) 06/28/2013   This is a chronic condition and he lives with family    Patient Active Problem List   Diagnosis Date Noted  . Acute respiratory failure with hypercapnia ([place]) 06/16/2016  . Acute respiratory failure with hypoxia ([place]) 06/16/2016  . Acute on chronic respiratory failure with hypoxia ([place]) 05/25/2016  . Altered mental status 09/22/2015  . Hyponatremia 09/22/2015  . Elevated lactic acid level 09/22/2015  . Acute encephalopathy   . Bullous emphysema ([place]) 07/26/2013  . Schizophrenia ([place]) 06/28/2013  . Tobacco use disorder 06/06/2013  . Abdominal pain 06/03/2013  . Sepsis ([place]) 06/03/2013  .  Elevated LFTs 06/03/2013  . HTN (hypertension) 06/03/2013  . HLD (hyperlipidemia) 06/03/2013  . Chronic obstructive pulmonary disease with acute exacerbation ([place]) 06/03/2013  . Calculus of bile duct without mention of cholecystitis or obstruction 06/03/2013    Past Surgical History:  Procedure Laterality Date  . CHOLECYSTECTOMY N/A 06/06/2013   Procedure: LAPAROSCOPIC CHOLECYSTECTOMY WITH ATTEMPTED INTRAOPERATIVE CHOLANGIOGRAM;  Surgeon: Zenovia Jarred, MD;  Location: [place];  Service: General;  Laterality: N/A;  . ERCP N/A 06/03/2013   Procedure: ENDOSCOPIC RETROGRADE CHOLANGIOPANCREATOGRAPHY (ERCP);  Surgeon: Ladene Artist, MD;  Location: [facility] ENDOSCOPY;  Service: Endoscopy;  Laterality: N/A;  . NO PAST SURGERIES          Home Medications    Prior to Admission medications   Medication Sig Start Date End Date Taking? Authorizing Provider  acetaminophen (TYLENOL) 325 MG tablet Take 2 tablets (650 mg total) by mouth every 6 (six) hours as needed for mild pain, moderate pain, fever or headache (or Fever >/= 101). 06/18/16  Yes Hongalgi, Lenis Dickinson, MD  ADVAIR [facility] 115-21 MCG/ACT inhaler Inhale 1 puff into the lungs 2 (two) times daily. 09/21/15  Yes [provider]  albuterol (PROVENTIL HFA;VENTOLIN HFA) 108 (90 Base) MCG/ACT inhaler Inhale 1-2 puffs into the lungs every 6 (six) hours as needed for wheezing or shortness of breath. Patient taking differently: Inhale 2 puffs into the lungs every 6 (six) hours.  07/07/17  Yes Wieters, Hallie C, PA-C  Ascorbic Acid (VITAMIN C PO) Take 1 tablet  by mouth daily.   Yes [provider]  atorvastatin (LIPITOR) 10 MG tablet Take 10 mg by mouth daily at 6 PM.  09/21/15  Yes [provider]  benztropine (COGENTIN) 2 MG tablet Take 1 tablet (2 mg total) by mouth 2 (two) times daily. 04/14/17  Yes Arfeen, Arlyce Harman, MD  carvedilol (COREG) 3.125 MG tablet Take 3.125 mg by mouth every 12 (twelve) hours. ONLY IF HR IS GREATER THAN 98-110  07/27/16  Yes [provider]  cetirizine (ZYRTEC) 10 MG tablet Take 10 mg by mouth at bedtime.   Yes [provider]  Cholecalciferol (VITAMIN D PO) Take 1 tablet by mouth every other day.    Yes [provider]  Cyanocobalamin (B-12 PO) Take 1 tablet by mouth every other day.    Yes [provider]  dextromethorphan-guaiFENesin (DIABETIC TUSSIN DM) 10-100 MG/5ML liquid Take 10 mLs by mouth every 4 (four) hours as needed for cough.   Yes [provider]  enalapril (VASOTEC) 10 MG tablet Take 10 mg by mouth daily. 06/04/16  Yes [provider]  famotidine (PEPCID) 40 MG tablet Take 40 mg by mouth 2 (two) times daily. 04/23/17  Yes [provider]  mirtazapine (REMERON) 30 MG tablet Take 1 tablet (30 mg total) by mouth at bedtime. 04/14/17 04/14/18 Yes Arfeen, Arlyce Harman, MD  multivitamin (ONE-A-DAY MEN'S) TABS tablet Take 1 tablet by mouth daily.   Yes [provider]  OXYGEN Inhale 2 L into the lungs See admin instructions. Inhale 2 liters via cannula upon exertion   Yes [provider]  risperidone (RISPERDAL) 4 MG tablet Take 1 tablet (4 mg total) by mouth 2 (two) times daily. 04/14/17  Yes Arfeen, Arlyce Harman, MD  albuterol (PROVENTIL) (2.5 MG/3ML) 0.083% nebulizer solution Take 3 mLs (2.5 mg total) by nebulization every 6 (six) hours as needed for wheezing or shortness of breath. May use every 4-6 hours for shortness of breath. 07/16/17   Antonietta Breach, PA-C  HYDROcodone-acetaminophen (NORCO) 7.5-325 MG tablet Take 1 tablet by mouth 3 (three) times daily as needed for pain. 04/21/17   [provider]  montelukast (SINGULAIR) 10 MG tablet Take 10 mg by mouth daily. 07/24/16   [provider]  traZODone (DESYREL) 50 MG tablet Take 1 tab at bed time Patient taking differently: Take 50 mg by mouth at bedtime as needed for sleep.  04/14/17   Arfeen, Arlyce Harman, MD    Family History Family History  Problem Relation Age of  Onset  . Diabetes Mellitus II Mother   . Diabetes Mother   . Heart disease Father   . Stroke Maternal Aunt   . CAD Neg Hx     Social History Social History   Tobacco Use  . Smoking status: Current Every Day Smoker    Packs/day: 1.00    Years: 10.00    Pack years: 10.00    Types: Cigarettes  . Smokeless tobacco: Never Used  Substance Use Topics  . Alcohol use: No  . Drug use: No     Allergies   Patient has no known allergies.   Review of Systems Review of Systems Ten systems reviewed and are negative for acute change, except as noted in the HPI.    Physical Exam Updated Vital Signs BP 120/75   Pulse 80   Temp 98.5 F (36.9 C) (Oral)   Resp 17   SpO2 98%   Physical Exam  Constitutional: He is oriented to person, place,  and time. He appears well-developed and well-nourished. No distress.  Nontoxic appearing and in NAD  HENT:  Head: Normocephalic and atraumatic.  Eyes: Conjunctivae and EOM are normal. No scleral icterus.  Neck: Normal range of motion.  Cardiovascular: Normal rate, regular rhythm and intact distal pulses.  Not tachycardic as previously documented.  Pulmonary/Chest: Effort normal. No respiratory distress. He has wheezes. He has no rales.  Prolonged expiration with mild expiratory wheeze. Chest expansion symmetric. No respiratory distress, rales, rhonchi.  Musculoskeletal: Normal range of motion.  Neurological: He is alert and oriented to person, place, and time. He exhibits normal muscle tone. Coordination normal.  Skin: Skin is warm and dry. No rash noted. He is not diaphoretic. No erythema. No pallor.  Psychiatric: He has a normal mood and affect. His behavior is normal.  Nursing note and vitals reviewed.    ED Treatments / Results  Labs (all labs ordered are listed, but only abnormal results are displayed) Labs Reviewed - No data to display  EKG EKG Interpretation  Date/Time:  Thursday Jul 16 2017 19:36:19 EDT Ventricular Rate:   82 PR Interval:    QRS Duration: 87 QT Interval:  336 QTC Calculation: 393 R Axis:   109 Text Interpretation:  Sinus rhythm Right axis deviation No significant change since last tracing T wave abnormality Abnormal ekg Confirmed by Carmin Muskrat [phone]) on 07/16/2017 11:23:06 PM   Radiology Dg Chest 2 View  Result Date: 07/16/2017 CLINICAL DATA:  Short of breath, chest tightness, smoking history EXAM: CHEST - 2 VIEW COMPARISON:  Chest x-ray of 07/07/2017 and CT chest of 05/25/2016 FINDINGS: The lungs remain very hyperaerated and severe changes of emphysema are noted with multiple bulla formation diffusely throughout the lungs. No pneumonia or effusion is seen. Mediastinal and hilar contours are stable and heart size is stable. No bony abnormality is seen. IMPRESSION: Severe emphysematous change throughout the lungs. No active process. Electronically Signed   By: Ivar Drape M.D.   On: 07/16/2017 14:41    Procedures Procedures (including critical care time)  Medications Ordered in ED Medications  albuterol (PROVENTIL) (2.5 MG/3ML) 0.083% nebulizer solution 5 mg (5 mg Nebulization Given 07/16/17 1350)  predniSONE (DELTASONE) tablet 60 mg (60 mg Oral Given 07/16/17 2216)  ipratropium-albuterol (DUONEB) 0.5-2.5 (3) MG/3ML nebulizer solution 3 mL (3 mLs Nebulization Given 07/16/17 2216)     Initial Impression / Assessment and Plan / ED Course  I have reviewed the triage vital signs and the nursing notes.  Pertinent labs & imaging results that were available during my care of the patient were reviewed by me and considered in my medical decision making (see chart for details).     62 year old male presents to the emergency department for worsening shortness of breath.  He has no signs of respiratory distress on initial or repeat exam.  Patient noted to be afebrile with reassuring, stable vital signs.  His shortness of breath improved in the emergency department following prednisone and a DuoNeb  treatment.  Chest x-ray today shows severe emphysematous changes without concern for underlying infection.  No pneumothorax, pleural effusion.  EKG unchanged from prior.  The patient has been monitored following his albuterol treatment without evidence of symptomatic rebound.  He is ambulatory in the department on his 2 L via nasal cannula with oxygen saturations at or above 95%.  Suspect COPD exacerbation for which wife has been counseled on the need for outpatient primary care follow-up.  She is requesting albuterol nebulizer solution which I have  provided a prescription for along with a prescription for nebulizer machine.  Encouraged more consistent use of home oxygen.  Do not believe further emergent work-up is indicated at this time.  Return precautions discussed and provided. Patient discharged in stable condition with no unaddressed concerns.   Vitals:   07/16/17 2230 07/16/17 2245 07/16/17 2300 07/16/17 2330  BP: 134/80 106/83 120/75 109/78  Pulse: 81 80 80 79  Resp: 17 14 17 14   Temp:      TempSrc:      SpO2: 98% 98% 98% 98%    Final Clinical Impressions(s) / ED Diagnoses   Final diagnoses:  COPD exacerbation [facility])    ED Discharge Orders        Ordered    albuterol (PROVENTIL) (2.5 MG/3ML) 0.083% nebulizer solution  Every 6 hours PRN     07/16/17 2348       Antonietta Breach, PA-C 07/17/17 0005    Carmin Muskrat, MD 07/18/17 [phone]

## 2017-07-16 NOTE — ED Provider Notes (Signed)
MSE was initiated and I personally evaluated the patient and placed orders (if any) at  9:45 PM on Jul 16, 2017.  The patient appears stable so that the remainder of the MSE may be completed by another provider.  Patient presenting with increased sob, cough and sputum since last night. No fever, chills. He was feeling well before last night. Today reported difficulty breathing and was brought in for evaluation. No increased work of breathing. O2 sats 100% on room air at 2L/min. Baseline home o2 at 2L/min. No c/p. He is well-appearing nontoxic and afebrile.  Ordered prednisone and nebs    Dossie Der 07/16/17 2159    Fredia Sorrow, MD 07/16/17 2329

## 2017-07-16 NOTE — ED Notes (Signed)
Pt ambulated around nurse's station independently while wearing 2LNC. Pt SpO2 sats stayed 95% and above. Pt has no complaints at this time.

## 2017-07-16 NOTE — Discharge Instructions (Addendum)
Continue with your daily medications including Advair.  Use 2 puffs of an albuterol/pro-air inhaler every 4-6 hours OR and albuterol nebulizer for persistent wheezing or shortness of breath..  Do not use a nebulizer and inhaler at the same time as they contain the same medications.  Continue with consistent use of your oxygen.  Follow-up with your primary care doctor regarding your visit today.

## 2017-07-16 NOTE — ED Notes (Signed)
Pt verbalizes understanding of d/c instructions. Pt received prescriptions. Pt ambulatory at d/c with all belongings.  

## 2017-07-16 NOTE — ED Triage Notes (Signed)
Patient complains of increasing SOB since last night. Uses oxygen at home when he needs it-using home oxygen on arrival. Patient alert and oriented, denies pain, smoker. Hx of COPD

## 2017-07-22 ENCOUNTER — Telehealth: Payer: Self-pay | Admitting: *Deleted

## 2017-07-22 NOTE — Telephone Encounter (Signed)
[facility] rep called with incomplete Rx for CPAP machine (missing DOB and NPI).  EDCM made corrections on Rx to have order processed.

## 2017-08-11 DIAGNOSIS — E7800 Pure hypercholesterolemia, unspecified: Secondary | ICD-10-CM | POA: Diagnosis not present

## 2017-08-11 DIAGNOSIS — M2550 Pain in unspecified joint: Secondary | ICD-10-CM | POA: Diagnosis not present

## 2017-08-11 DIAGNOSIS — D751 Secondary polycythemia: Secondary | ICD-10-CM | POA: Diagnosis not present

## 2017-08-11 DIAGNOSIS — F319 Bipolar disorder, unspecified: Secondary | ICD-10-CM | POA: Diagnosis not present

## 2017-08-11 DIAGNOSIS — I119 Hypertensive heart disease without heart failure: Secondary | ICD-10-CM | POA: Diagnosis not present

## 2017-08-11 DIAGNOSIS — Z79891 Long term (current) use of opiate analgesic: Secondary | ICD-10-CM | POA: Diagnosis not present

## 2017-08-11 DIAGNOSIS — M159 Polyosteoarthritis, unspecified: Secondary | ICD-10-CM | POA: Diagnosis not present

## 2017-08-11 DIAGNOSIS — J441 Chronic obstructive pulmonary disease with (acute) exacerbation: Secondary | ICD-10-CM | POA: Diagnosis not present

## 2017-08-11 DIAGNOSIS — Z125 Encounter for screening for malignant neoplasm of prostate: Secondary | ICD-10-CM | POA: Diagnosis not present

## 2017-08-11 DIAGNOSIS — K267 Chronic duodenal ulcer without hemorrhage or perforation: Secondary | ICD-10-CM | POA: Diagnosis not present

## 2017-08-11 DIAGNOSIS — K210 Gastro-esophageal reflux disease with esophagitis: Secondary | ICD-10-CM | POA: Diagnosis not present

## 2017-08-11 DIAGNOSIS — Z79899 Other long term (current) drug therapy: Secondary | ICD-10-CM | POA: Diagnosis not present

## 2017-08-26 ENCOUNTER — Other Ambulatory Visit (HOSPITAL_COMMUNITY): Payer: Self-pay | Admitting: Psychiatry

## 2017-08-26 DIAGNOSIS — F200 Paranoid schizophrenia: Secondary | ICD-10-CM

## 2017-08-31 ENCOUNTER — Other Ambulatory Visit (HOSPITAL_COMMUNITY): Payer: Self-pay

## 2017-08-31 DIAGNOSIS — F200 Paranoid schizophrenia: Secondary | ICD-10-CM

## 2017-08-31 MED ORDER — RISPERIDONE 4 MG PO TABS: 4.0000 mg | ORAL_TABLET | Freq: Two times a day (BID) | ORAL | 0 refills | Status: DC

## 2017-09-02 ENCOUNTER — Other Ambulatory Visit (HOSPITAL_COMMUNITY): Payer: Self-pay | Admitting: Psychiatry

## 2017-09-03 ENCOUNTER — Encounter (HOSPITAL_COMMUNITY): Payer: Self-pay | Admitting: Psychiatry

## 2017-09-03 ENCOUNTER — Ambulatory Visit (HOSPITAL_COMMUNITY): Payer: Self-pay | Admitting: Psychiatry

## 2017-09-03 ENCOUNTER — Ambulatory Visit (INDEPENDENT_AMBULATORY_CARE_PROVIDER_SITE_OTHER): Payer: Medicare Other | Admitting: Psychiatry

## 2017-09-03 DIAGNOSIS — Z9981 Dependence on supplemental oxygen: Secondary | ICD-10-CM

## 2017-09-03 DIAGNOSIS — R251 Tremor, unspecified: Secondary | ICD-10-CM

## 2017-09-03 DIAGNOSIS — F17210 Nicotine dependence, cigarettes, uncomplicated: Secondary | ICD-10-CM | POA: Diagnosis not present

## 2017-09-03 DIAGNOSIS — Z79899 Other long term (current) drug therapy: Secondary | ICD-10-CM

## 2017-09-03 DIAGNOSIS — F200 Paranoid schizophrenia: Secondary | ICD-10-CM

## 2017-09-03 MED ORDER — TRAZODONE HCL 50 MG PO TABS: 50.0000 mg | ORAL_TABLET | Freq: Every evening | ORAL | 0 refills | Status: DC | PRN

## 2017-09-03 MED ORDER — MIRTAZAPINE 30 MG PO TABS: 30.0000 mg | ORAL_TABLET | Freq: Every day | ORAL | 1 refills | Status: DC

## 2017-09-03 MED ORDER — BENZTROPINE MESYLATE 2 MG PO TABS: 2.0000 mg | ORAL_TABLET | Freq: Two times a day (BID) | ORAL | 1 refills | Status: DC

## 2017-09-03 MED ORDER — RISPERIDONE 4 MG PO TABS: 4.0000 mg | ORAL_TABLET | Freq: Two times a day (BID) | ORAL | 1 refills | Status: DC

## 2017-09-03 NOTE — Progress Notes (Signed)
[place] MD/PA/NP OP Progress Note  09/03/2017 4:23 PM Blake Mcknight  MRN:  [id number]  Chief Complaint: I am doing fine.  I need my medication.  HPI: Patient came for his follow-up appointment with his sister.  He has been taking his medication which is helping his paranoia, agitation and sleep.  He takes trazodone only as needed.  Patient is a poor historian and most of the information was obtained through his sister.  As per sister he is not agitated, angry, aggressive but sometimes talk to himself and giggling.  Patient has no tremors or shakes.  He is on oxygen.  Patient denies drinking or using any illegal substances.  He wants to continue trazodone, Remeron, Risperdal and Cogentin.  Visit Diagnosis:    ICD-10-CM   1. Paranoid schizophrenia ([place]) F20.0 mirtazapine (REMERON) 30 MG tablet    traZODone (DESYREL) 50 MG tablet    benztropine (COGENTIN) 2 MG tablet    risperidone (RISPERDAL) 4 MG tablet    Past Psychiatric History: Reviewed. Patient has history of schizophrenia and he was admitted at [facility] years ago. At that time he was very paranoid, delusional, disorganized behavior and having mood swings.  Past Medical History:  Past Medical History:  Diagnosis Date  . COPD (chronic obstructive pulmonary disease) (HCC)    with bullous emphysema.   Marland Kitchen Heavy cigarette smoker before 2003   pt claims only 10 cigs per day, never heavier amounts.   Marland Kitchen HLD (hyperlipidemia)   . Hypertension   . Schizophrenia ([place]) 06/28/2013   This is a chronic condition and he lives with family    Past Surgical History:  Procedure Laterality Date  . CHOLECYSTECTOMY N/A 06/06/2013   Procedure: LAPAROSCOPIC CHOLECYSTECTOMY WITH ATTEMPTED INTRAOPERATIVE CHOLANGIOGRAM;  Surgeon: Zenovia Jarred, MD;  Location: [place];  Service: General;  Laterality: N/A;  . ERCP N/A 06/03/2013   Procedure: ENDOSCOPIC RETROGRADE CHOLANGIOPANCREATOGRAPHY (ERCP);  Surgeon: Ladene Artist, MD;  Location: [facility] ENDOSCOPY;   Service: Endoscopy;  Laterality: N/A;  . NO PAST SURGERIES      Family Psychiatric History: Viewed  Family History:  Family History  Problem Relation Age of Onset  . Diabetes Mellitus II Mother   . Diabetes Mother   . Heart disease Father   . Stroke Maternal Aunt   . CAD Neg Hx     Social History:  Social History   Socioeconomic History  . Marital status: Single    Spouse name: Not on file  . Number of children: Not on file  . Years of education: Not on file  . Highest education level: Not on file  Occupational History  . Not on file  Social Needs  . Financial resource strain: Not on file  . Food insecurity:    Worry: Not on file    Inability: Not on file  . Transportation needs:    Medical: Not on file    Non-medical: Not on file  Tobacco Use  . Smoking status: Current Every Day Smoker    Packs/day: 1.00    Years: 10.00    Pack years: 10.00    Types: Cigarettes  . Smokeless tobacco: Never Used  . Tobacco comment: Gets 4 puffs a day.   Substance and Sexual Activity  . Alcohol use: No  . Drug use: No  . Sexual activity: Yes  Lifestyle  . Physical activity:    Days per week: Not on file    Minutes per session: Not on file  . Stress:  Not on file  Relationships  . Social connections:    Talks on phone: Not on file    Gets together: Not on file    Attends religious service: Not on file    Active member of club or organization: Not on file    Attends meetings of clubs or organizations: Not on file    Relationship status: Not on file  Other Topics Concern  . Not on file  Social History Narrative  . Not on file    Allergies: No Known Allergies  Metabolic Disorder Labs: No results found for: HGBA1C, MPG No results found for: PROLACTIN No results found for: CHOL, TRIG, HDL, CHOLHDL, VLDL, LDLCALC No results found for: TSH  Therapeutic Level Labs: No results found for: LITHIUM No results found for: VALPROATE No components found for:  CBMZ  Current  Medications: Current Outpatient Medications  Medication Sig Dispense Refill  . acetaminophen (TYLENOL) 325 MG tablet Take 2 tablets (650 mg total) by mouth every 6 (six) hours as needed for mild pain, moderate pain, fever or headache (or Fever >/= 101).    . ADVAIR HFA 798-92 MCG/ACT inhaler Inhale 1 puff into the lungs 2 (two) times daily.    Marland Kitchen albuterol (PROVENTIL HFA;VENTOLIN HFA) 108 (90 Base) MCG/ACT inhaler Inhale 1-2 puffs into the lungs every 6 (six) hours as needed for wheezing or shortness of breath. (Patient taking differently: Inhale 2 puffs into the lungs every 6 (six) hours. ) 1 Inhaler 0  . albuterol (PROVENTIL) (2.5 MG/3ML) 0.083% nebulizer solution Take 3 mLs (2.5 mg total) by nebulization every 6 (six) hours as needed for wheezing or shortness of breath. May use every 4-6 hours for shortness of breath. 75 mL 12  . Ascorbic Acid (VITAMIN C PO) Take 1 tablet by mouth daily.    Marland Kitchen atorvastatin (LIPITOR) 10 MG tablet Take 10 mg by mouth daily at 6 PM.     . benztropine (COGENTIN) 2 MG tablet Take 1 tablet (2 mg total) by mouth 2 (two) times daily. 90 tablet 1  . carvedilol (COREG) 3.125 MG tablet Take 3.125 mg by mouth every 12 (twelve) hours. ONLY IF HR IS GREATER THAN 98-110  0  . cetirizine (ZYRTEC) 10 MG tablet Take 10 mg by mouth at bedtime.    . Cholecalciferol (VITAMIN D PO) Take 1 tablet by mouth every other day.     . Cyanocobalamin (B-12 PO) Take 1 tablet by mouth every other day.     Marland Kitchen dextromethorphan-guaiFENesin (DIABETIC TUSSIN DM) 10-100 MG/5ML liquid Take 10 mLs by mouth every 4 (four) hours as needed for cough.    . enalapril (VASOTEC) 10 MG tablet Take 10 mg by mouth daily.    . famotidine (PEPCID) 40 MG tablet Take 40 mg by mouth 2 (two) times daily.  0  . HYDROcodone-acetaminophen (NORCO) 7.5-325 MG tablet Take 1 tablet by mouth 3 (three) times daily as needed for pain.  0  . mirtazapine (REMERON) 30 MG tablet Take 1 tablet (30 mg total) by mouth at bedtime. 90  tablet 1  . montelukast (SINGULAIR) 10 MG tablet Take 10 mg by mouth daily.  0  . multivitamin (ONE-A-DAY MEN'S) TABS tablet Take 1 tablet by mouth daily.    . OXYGEN Inhale 2 L into the lungs See admin instructions. Inhale 2 liters via cannula upon exertion    . risperidone (RISPERDAL) 4 MG tablet Take 1 tablet (4 mg total) by mouth 2 (two) times daily. 180 tablet 1  .  traZODone (DESYREL) 50 MG tablet Take 1 tablet (50 mg total) by mouth at bedtime as needed for sleep. 90 tablet 0   No current facility-administered medications for this visit.      Musculoskeletal: Strength & Muscle Tone: within normal limits Gait & Station: normal Patient leans: N/A  Psychiatric Specialty Exam: ROS  Blood pressure 108/68, pulse (!) 116, height 5' 6.5" (1.689 m), weight 142 lb 12.8 oz (64.8 kg), SpO2 91 %.Body mass index is 22.7 kg/m.  General Appearance: Casual and Superficially cooperative  Eye Contact:  Fair  Speech:  Slow  Volume:  Decreased  Mood:  Euthymic  Affect:  Constricted  Thought Process:  Descriptions of Associations: Circumstantial  Orientation:  Full (Time, Place, and Person)  Thought Content: Poverty of thought content   Suicidal Thoughts:  No  Homicidal Thoughts:  No  Memory:  Immediate;   Fair Recent;   Fair Remote;   Fair  Judgement:  Fair  Insight:  Fair  Psychomotor Activity:  Restlessness  Concentration:  Concentration: Fair and Attention Span: Fair  Recall:  [employer] of Knowledge: Fair  Language: Fair  Akathisia:  No  Handed:  Right  AIMS (if indicated): not done  Assets:  Desire for Improvement Housing  ADL's:  Intact  Cognition: Impaired,  Mild  Sleep:  Good   Screenings:   Assessment and Plan: Schizophrenia chronic paranoid type.  Patient is stable on his current medication.  He has mild tremors but no other major concern.  Continue Risperdal 4 mg twice a day, Remeron 30 mg at bedtime, Cogentin 2 mg twice a day and trazodone 50 mg as needed.   Discussed medication side effects and benefits.  Recommended to call us back if is any question or any concern.  Follow-up in 4 to 5 months.   Kathlee Nations, MD 09/03/2017, 4:23 PM

## 2017-09-25 ENCOUNTER — Other Ambulatory Visit (HOSPITAL_COMMUNITY): Payer: Self-pay | Admitting: Psychiatry

## 2017-09-25 DIAGNOSIS — F200 Paranoid schizophrenia: Secondary | ICD-10-CM

## 2017-09-28 ENCOUNTER — Other Ambulatory Visit (HOSPITAL_COMMUNITY): Payer: Self-pay | Admitting: Psychiatry

## 2017-09-28 ENCOUNTER — Other Ambulatory Visit (HOSPITAL_COMMUNITY): Payer: Self-pay

## 2017-09-28 DIAGNOSIS — F200 Paranoid schizophrenia: Secondary | ICD-10-CM

## 2017-11-27 ENCOUNTER — Other Ambulatory Visit (HOSPITAL_COMMUNITY): Payer: Self-pay | Admitting: Psychiatry

## 2017-11-27 DIAGNOSIS — F200 Paranoid schizophrenia: Secondary | ICD-10-CM

## 2017-12-02 ENCOUNTER — Other Ambulatory Visit (HOSPITAL_COMMUNITY): Payer: Self-pay | Admitting: Psychiatry

## 2017-12-02 DIAGNOSIS — F200 Paranoid schizophrenia: Secondary | ICD-10-CM

## 2017-12-28 ENCOUNTER — Other Ambulatory Visit (HOSPITAL_COMMUNITY): Payer: Self-pay | Admitting: Psychiatry

## 2017-12-28 DIAGNOSIS — F200 Paranoid schizophrenia: Secondary | ICD-10-CM

## 2017-12-28 MED ORDER — MIRTAZAPINE 30 MG PO TABS: 30.0000 mg | ORAL_TABLET | Freq: Every day | ORAL | 1 refills | Status: DC

## 2017-12-28 MED ORDER — RISPERIDONE 4 MG PO TABS: 4.0000 mg | ORAL_TABLET | Freq: Two times a day (BID) | ORAL | 1 refills | Status: DC

## 2017-12-28 MED ORDER — BENZTROPINE MESYLATE 2 MG PO TABS: 2.0000 mg | ORAL_TABLET | Freq: Two times a day (BID) | ORAL | 1 refills | Status: DC

## 2017-12-28 MED ORDER — TRAZODONE HCL 50 MG PO TABS: 50.0000 mg | ORAL_TABLET | Freq: Every evening | ORAL | 0 refills | Status: DC | PRN

## 2017-12-29 ENCOUNTER — Other Ambulatory Visit (HOSPITAL_COMMUNITY): Payer: Self-pay | Admitting: Psychiatry

## 2018-01-13 ENCOUNTER — Other Ambulatory Visit (HOSPITAL_COMMUNITY): Payer: Self-pay | Admitting: Psychiatry

## 2018-01-13 DIAGNOSIS — F200 Paranoid schizophrenia: Secondary | ICD-10-CM

## 2018-01-17 ENCOUNTER — Emergency Department (HOSPITAL_COMMUNITY)
Admission: EM | Admit: 2018-01-17 | Discharge: 2018-01-18 | Disposition: A | Discharge status: Home / Self Care | Payer: Medicare Other | Attending: Emergency Medicine | Admitting: Emergency Medicine

## 2018-01-17 ENCOUNTER — Other Ambulatory Visit: Payer: Self-pay

## 2018-01-17 ENCOUNTER — Emergency Department (HOSPITAL_COMMUNITY): Payer: Medicare Other

## 2018-01-17 DIAGNOSIS — Z79899 Other long term (current) drug therapy: Secondary | ICD-10-CM | POA: Diagnosis not present

## 2018-01-17 DIAGNOSIS — F17210 Nicotine dependence, cigarettes, uncomplicated: Secondary | ICD-10-CM | POA: Insufficient documentation

## 2018-01-17 DIAGNOSIS — R0602 Shortness of breath: Secondary | ICD-10-CM | POA: Diagnosis not present

## 2018-01-17 DIAGNOSIS — I10 Essential (primary) hypertension: Secondary | ICD-10-CM | POA: Insufficient documentation

## 2018-01-17 DIAGNOSIS — J441 Chronic obstructive pulmonary disease with (acute) exacerbation: Secondary | ICD-10-CM

## 2018-01-17 LAB — BASIC METABOLIC PANEL
Anion gap: 8 (ref 5–15)
CHLORIDE: 98 mmol/L (ref 98–111)
CO2: 26 mmol/L (ref 22–32)
Calcium: 9.7 mg/dL (ref 8.9–10.3)
Creatinine, Ser: 0.82 mg/dL (ref 0.61–1.24)
GFR calc non Af Amer: >60 mL/min (ref >60)
Glucose, Bld: 100 mg/dL — ABNORMAL HIGH (ref 70–99)
POTASSIUM: 4.1 mmol/L (ref 3.5–5.1)
SODIUM: 132 mmol/L — AB (ref 135–145)

## 2018-01-17 LAB — I-STAT TROPONIN, ED: Troponin i, poc: 0.01 ng/mL (ref 0.00–0.08)

## 2018-01-17 LAB — CBC
HCT: 48 % (ref 39.0–52.0)
HEMOGLOBIN: 15.4 g/dL (ref 13.0–17.0)
MCH: 28.7 pg (ref 26.0–34.0)
MCHC: 32.1 g/dL (ref 30.0–36.0)
MCV: 89.6 fL (ref 80.0–100.0)
NRBC: 0 % (ref 0.0–0.2)
Platelets: 274 10*3/uL (ref 150–400)
RBC: 5.36 MIL/uL (ref 4.22–5.81)
RDW: 13 % (ref 11.5–15.5)
WBC: 8.6 10*3/uL (ref 4.0–10.5)

## 2018-01-17 NOTE — ED Triage Notes (Signed)
Patient's sister states that he needs to have his breathing checked. States that she is concerned about "fluid...yesterday he weighed 143lbs and today he weighs 146lbs"

## 2018-01-18 DIAGNOSIS — J441 Chronic obstructive pulmonary disease with (acute) exacerbation: Secondary | ICD-10-CM | POA: Diagnosis not present

## 2018-01-18 MED ORDER — PREDNISONE 20 MG PO TABS: 60.0000 mg | ORAL_TABLET | Freq: Every day | ORAL | 0 refills | Status: DC

## 2018-01-18 MED ORDER — PREDNISONE 20 MG PO TABS
60.0000 mg | ORAL_TABLET | Freq: Once | ORAL | Status: AC
  Administered 2018-01-18: 60 mg via ORAL
  Filled 2018-01-18: qty 3

## 2018-01-18 MED ORDER — IPRATROPIUM-ALBUTEROL 0.5-2.5 (3) MG/3ML IN SOLN
3.0000 mL | Freq: Once | RESPIRATORY_TRACT | Status: AC
  Administered 2018-01-18: 3 mL via RESPIRATORY_TRACT
  Filled 2018-01-18: qty 3

## 2018-01-18 NOTE — ED Provider Notes (Signed)
TIME SEEN: 12:36 AM  CHIEF COMPLAINT: Wheezing  HPI: Patient is a 61 year old male with history of schizophrenia, hypertension, hyperlipidemia, COPD not on home oxygen who presents to the emergency department with wheezing.  Family provides most of the history.  States he has been coughing up clear sputum for the past several days and wheezing.  They have been giving him nebulizer treatments.  He reports he has been short of breath.  No chest pain.  No known fever.  Family wanted to make sure that he did not have pneumonia.  ROS: See HPI Constitutional: no fever  Eyes: no drainage  ENT: no runny nose   Cardiovascular:  no chest pain  Resp:  SOB  GI: no vomiting GU: no dysuria Integumentary: no rash  Allergy: no hives  Musculoskeletal: no leg swelling  Neurological: no slurred speech ROS otherwise negative  PAST MEDICAL HISTORY/PAST SURGICAL HISTORY:  Past Medical History:  Diagnosis Date  . COPD (chronic obstructive pulmonary disease) (HCC)    with bullous emphysema.   Marland Kitchen Heavy cigarette smoker before 2003   pt claims only 10 cigs per day, never heavier amounts.   Marland Kitchen HLD (hyperlipidemia)   . Hypertension   . Schizophrenia ([place]) 06/28/2013   This is a chronic condition and he lives with family    MEDICATIONS:  Prior to Admission medications   Medication Sig Start Date End Date Taking? Authorizing Provider  acetaminophen (TYLENOL) 325 MG tablet Take 2 tablets (650 mg total) by mouth every 6 (six) hours as needed for mild pain, moderate pain, fever or headache (or Fever >/= 101). 06/18/16   Hongalgi, Lenis Dickinson, MD  ADVAIR [facility] 115-21 MCG/ACT inhaler Inhale 1 puff into the lungs 2 (two) times daily. 09/21/15   [provider]  albuterol (PROVENTIL HFA;VENTOLIN HFA) 108 (90 Base) MCG/ACT inhaler Inhale 1-2 puffs into the lungs every 6 (six) hours as needed for wheezing or shortness of breath. Patient taking differently: Inhale 2 puffs into the lungs every 6 (six) hours.  07/07/17    Wieters, Hallie C, PA-C  albuterol (PROVENTIL) (2.5 MG/3ML) 0.083% nebulizer solution Take 3 mLs (2.5 mg total) by nebulization every 6 (six) hours as needed for wheezing or shortness of breath. May use every 4-6 hours for shortness of breath. 07/16/17   Antonietta Breach, PA-C  Ascorbic Acid (VITAMIN C PO) Take 1 tablet by mouth daily.    [provider]  atorvastatin (LIPITOR) 10 MG tablet Take 10 mg by mouth daily at 6 PM.  09/21/15   [provider]  benztropine (COGENTIN) 2 MG tablet Take 1 tablet (2 mg total) by mouth 2 (two) times daily. 12/28/17   Arfeen, Arlyce Harman, MD  carvedilol (COREG) 3.125 MG tablet Take 3.125 mg by mouth every 12 (twelve) hours. ONLY IF HR IS GREATER THAN 98-110 07/27/16   [provider]  cetirizine (ZYRTEC) 10 MG tablet Take 10 mg by mouth at bedtime.    [provider]  Cholecalciferol (VITAMIN D PO) Take 1 tablet by mouth every other day.     [provider]  Cyanocobalamin (B-12 PO) Take 1 tablet by mouth every other day.     [provider]  dextromethorphan-guaiFENesin (DIABETIC TUSSIN DM) 10-100 MG/5ML liquid Take 10 mLs by mouth every 4 (four) hours as needed for cough.    [provider]  enalapril (VASOTEC) 10 MG tablet Take 10 mg by mouth daily. 06/04/16   [provider]  famotidine (PEPCID) 40 MG tablet Take 40  mg by mouth 2 (two) times daily. 04/23/17   [provider]  HYDROcodone-acetaminophen (NORCO) 7.5-325 MG tablet Take 1 tablet by mouth 3 (three) times daily as needed for pain. 04/21/17   [provider]  mirtazapine (REMERON) 30 MG tablet Take 1 tablet (30 mg total) by mouth at bedtime. 12/28/17 12/28/18  Arfeen, Arlyce Harman, MD  montelukast (SINGULAIR) 10 MG tablet Take 10 mg by mouth daily. 07/24/16   [provider]  multivitamin (ONE-A-DAY MEN'S) TABS tablet Take 1 tablet by mouth daily.    [provider]  OXYGEN Inhale 2 L into the lungs See admin  instructions. Inhale 2 liters via cannula upon exertion    [provider]  risperidone (RISPERDAL) 4 MG tablet Take 1 tablet (4 mg total) by mouth 2 (two) times daily. 12/28/17   Arfeen, Arlyce Harman, MD  traZODone (DESYREL) 50 MG tablet Take 1 tablet (50 mg total) by mouth at bedtime as needed for sleep. 12/28/17   Arfeen, Arlyce Harman, MD    ALLERGIES:  No Known Allergies  SOCIAL HISTORY:  Social History   Tobacco Use  . Smoking status: Current Every Day Smoker    Packs/day: 1.00    Years: 10.00    Pack years: 10.00    Types: Cigarettes  . Smokeless tobacco: Never Used  . Tobacco comment: Gets 4 puffs a day.   Substance Use Topics  . Alcohol use: No    FAMILY HISTORY: Family History  Problem Relation Age of Onset  . Diabetes Mellitus II Mother   . Diabetes Mother   . Heart disease Father   . Stroke Maternal Aunt   . CAD Neg Hx     EXAM: BP 138/82   Pulse 79   Temp 97.7 F (36.5 C) (Oral)   Resp 20   Wt 66.2 kg   SpO2 96%   BMI 23.21 kg/m  CONSTITUTIONAL: Alert and oriented and responds appropriately to questions. Well-appearing; well-nourished HEAD: Normocephalic EYES: Conjunctivae clear, pupils appear equal, EOMI ENT: normal nose; moist mucous membranes NECK: Supple, no meningismus, no nuchal rigidity, no LAD  CARD: RRR; S1 and S2 appreciated; no murmurs, no clicks, no rubs, no gallops RESP: Normal chest excursion without splinting or tachypnea; breath sounds equal bilaterally, scattered extra Tory wheezing,, no rhonchi, no rales, no hypoxia or respiratory distress, speaking full sentences ABD/GI: Normal bowel sounds; non-distended; soft, non-tender, no rebound, no guarding, no peritoneal signs, no hepatosplenomegaly BACK:  The back appears normal and is non-tender to palpation, there is no CVA tenderness EXT: Normal ROM in all joints; non-tender to palpation; no edema; normal capillary refill; no cyanosis, no calf tenderness or swelling    SKIN: Normal color for  age and race; warm; no rash NEURO: Moves all extremities equally PSYCH: The patient's mood and manner are appropriate. Grooming and personal hygiene are appropriate.  MEDICAL DECISION MAKING: Patient here with mild COPD exacerbation.  No signs of volume overload.  No chest pain.  Labs including troponin negative.  EKG shows no ischemic change.  Chest x-ray shows no pneumonia or pulmonary edema.  Will give DuoNeb here and prednisone but anticipate discharge home.  Patient and family comfortable with this plan.  ED PROGRESS: Lungs are now clear after DuoNeb.  Patient reports feeling better.  Will discharge home with prednisone burst.  They have an albuterol inhaler and nebulizer machine with medication at home as needed.  Have a PCP for follow-up.  Discussed return precautions.  At this time I  do not feel he needs antibiotics.  At this time, I do not feel there is any life-threatening condition present. I have reviewed and discussed all results (EKG, imaging, lab, urine as appropriate) and exam findings with patient/family. I have reviewed nursing notes and appropriate previous records.  I feel the patient is safe to be discharged home without further emergent workup and can continue workup as an outpatient as needed. Discussed usual and customary return precautions. Patient/family verbalize understanding and are comfortable with this plan.  Outpatient follow-up has been provided if needed. All questions have been answered.      EKG Interpretation  Date/Time:  Sunday January 17 2018 20:51:43 EST Ventricular Rate:  80 PR Interval:  160 QRS Duration: 84 QT Interval:  352 QTC Calculation: 405 R Axis:   87 Text Interpretation:  Normal sinus rhythm Septal infarct , age undetermined Abnormal ECG No significant change since last tracing Confirmed by Ward, Cyril Mourning [phone]) on 01/17/2018 11:59:05 PM         Ward, Delice Bison, DO 01/18/18 0155

## 2018-01-18 NOTE — ED Notes (Signed)
Patient verbalizes understanding of medications and discharge instructions. No further questions at this time. VSS and patient ambulatory at discharge.   

## 2018-01-23 IMAGING — CR DG CHEST 1V PORT
1 series · 1 of 1 positions shown · non-contrast
Comparison: [DATE].

CLINICAL DATA: Respiratory failure.  Shortness of breath.

EXAM:
PORTABLE CHEST 1 VIEW

[AP]
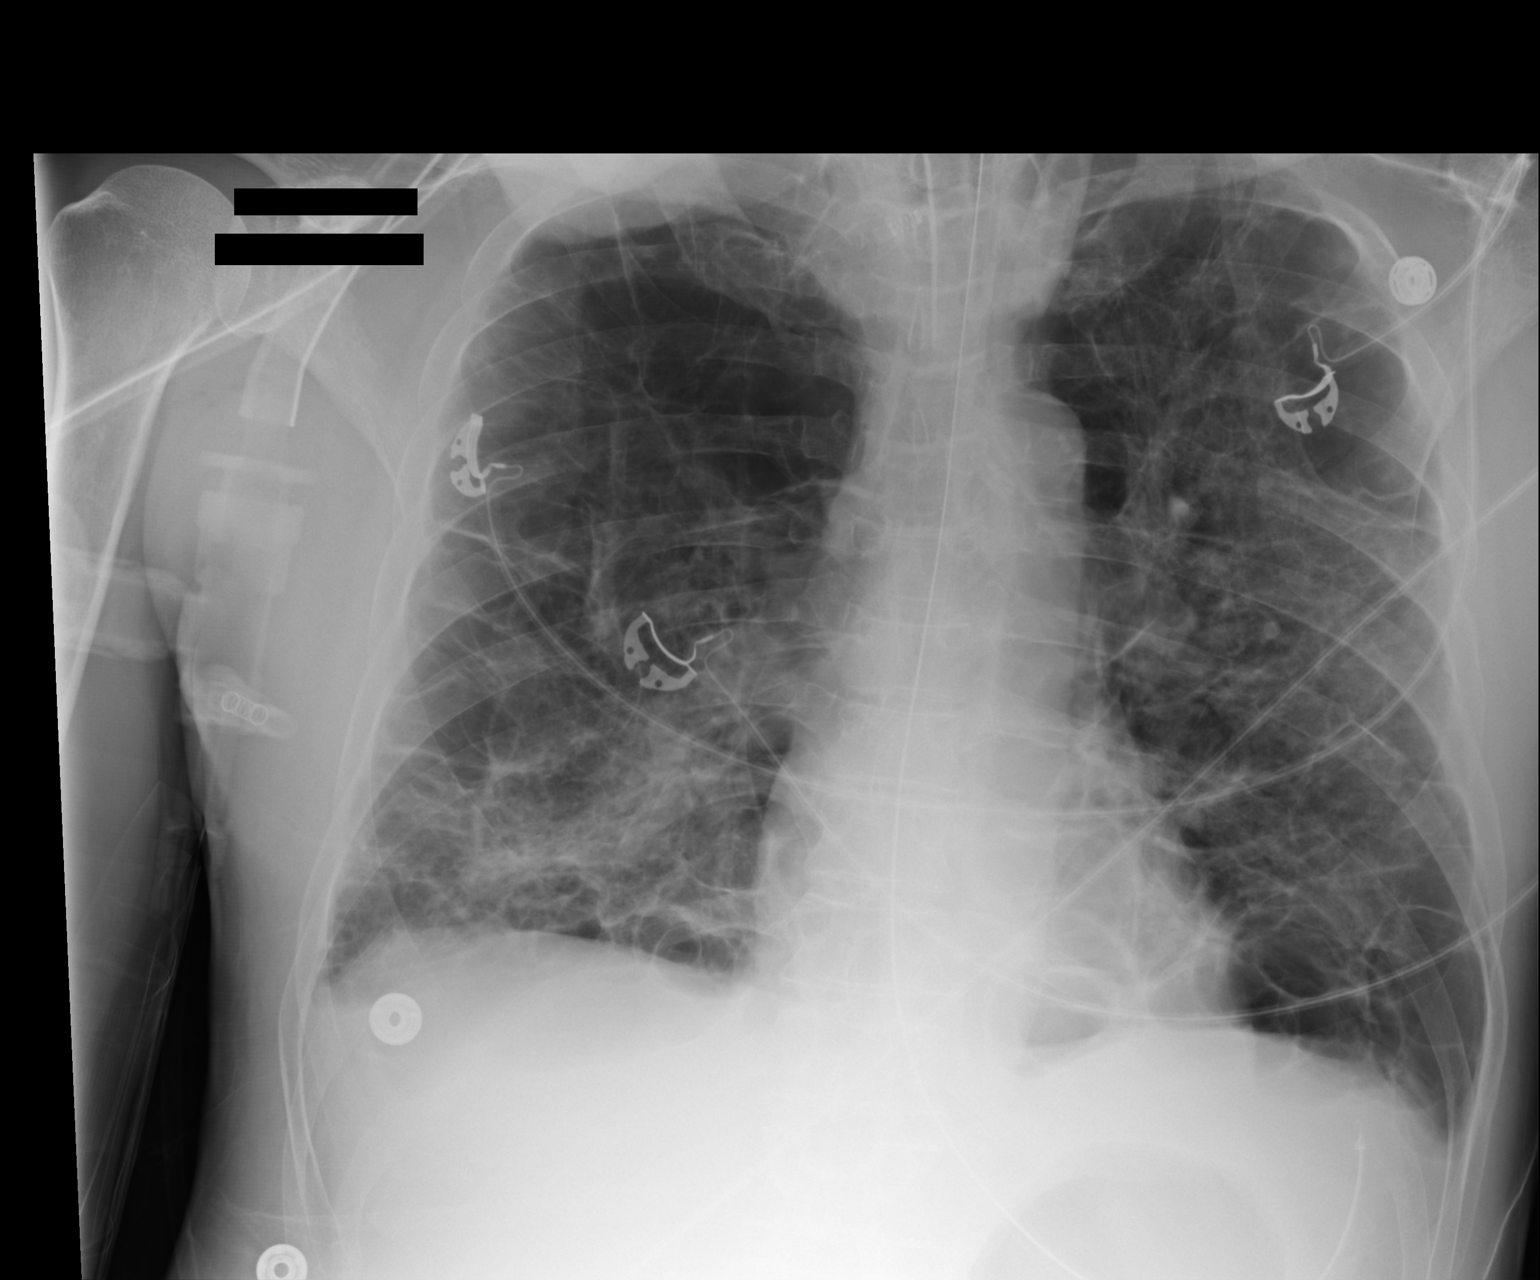

[1 of 1 positions shown; findings below may reference images not displayed]

FINDINGS: Endotracheal tube in satisfactory position. Nasogastric tube
extending into the stomach. Extensive bilateral bullous changes.
Progressive right lower lobe airspace opacity. No acute bony
abnormality. Normal sized heart.
IMPRESSION: 1. Progressive right lower lobe pneumonia.
2. Stable marked changes of COPD with extensive bullous emphysema.

## 2018-01-25 IMAGING — CR DG CHEST 1V PORT
1 series · 1 of 1 positions shown · non-contrast
Comparison: Portable exam 4144 hours compared to 05/27/2016

CLINICAL DATA: Shortness of breath, respiratory failure, COPD,
hypertension, smoker

EXAM:
PORTABLE CHEST 1 VIEW

[AP]
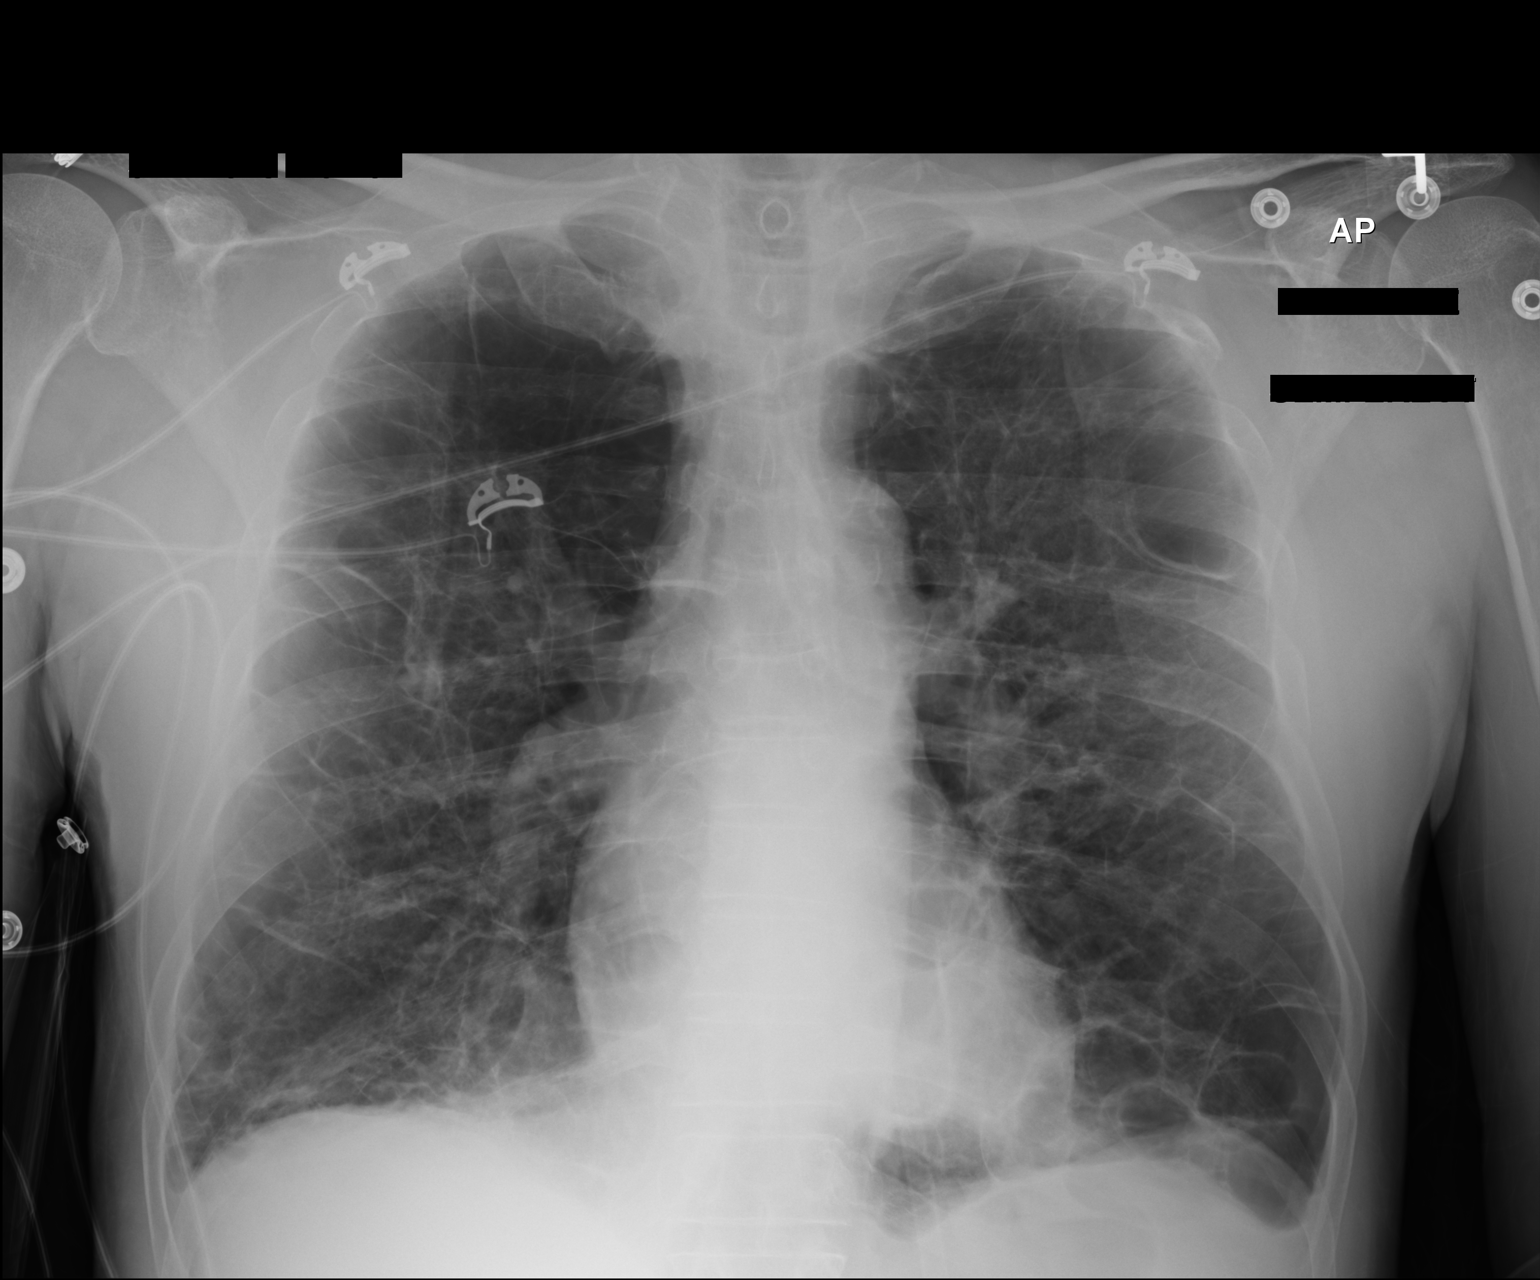

[1 of 1 positions shown; findings below may reference images not displayed]

FINDINGS: Normal heart size, mediastinal contours, and pulmonary vascularity.

Severe emphysematous bullous disease consistent with COPD.

Scattered areas of parenchymal scarring in both lungs.

Persistent RIGHT basilar infiltrate.

No gross pleural effusion or pneumothorax.

Bones unremarkable.
IMPRESSION: Bullous COPD changes with persistent RIGHT basilar infiltrate.

## 2018-01-26 DIAGNOSIS — F319 Bipolar disorder, unspecified: Secondary | ICD-10-CM | POA: Diagnosis not present

## 2018-01-26 DIAGNOSIS — K267 Chronic duodenal ulcer without hemorrhage or perforation: Secondary | ICD-10-CM | POA: Diagnosis not present

## 2018-01-26 DIAGNOSIS — J441 Chronic obstructive pulmonary disease with (acute) exacerbation: Secondary | ICD-10-CM | POA: Diagnosis not present

## 2018-01-26 DIAGNOSIS — I119 Hypertensive heart disease without heart failure: Secondary | ICD-10-CM | POA: Diagnosis not present

## 2018-01-26 DIAGNOSIS — Z79899 Other long term (current) drug therapy: Secondary | ICD-10-CM | POA: Diagnosis not present

## 2018-01-26 DIAGNOSIS — Z23 Encounter for immunization: Secondary | ICD-10-CM | POA: Diagnosis not present

## 2018-01-26 DIAGNOSIS — M2550 Pain in unspecified joint: Secondary | ICD-10-CM | POA: Diagnosis not present

## 2018-01-26 DIAGNOSIS — Z0001 Encounter for general adult medical examination with abnormal findings: Secondary | ICD-10-CM | POA: Diagnosis not present

## 2018-01-26 DIAGNOSIS — E7800 Pure hypercholesterolemia, unspecified: Secondary | ICD-10-CM | POA: Diagnosis not present

## 2018-01-26 DIAGNOSIS — K210 Gastro-esophageal reflux disease with esophagitis: Secondary | ICD-10-CM | POA: Diagnosis not present

## 2018-01-26 DIAGNOSIS — M159 Polyosteoarthritis, unspecified: Secondary | ICD-10-CM | POA: Diagnosis not present

## 2018-02-09 ENCOUNTER — Ambulatory Visit (INDEPENDENT_AMBULATORY_CARE_PROVIDER_SITE_OTHER): Payer: Medicare Other | Admitting: Psychiatry

## 2018-02-09 ENCOUNTER — Encounter (HOSPITAL_COMMUNITY): Payer: Self-pay | Admitting: Psychiatry

## 2018-02-09 VITALS — BP 120/73 | HR 104 | Ht 66.5 in | Wt 144.0 lb

## 2018-02-09 DIAGNOSIS — F5105 Insomnia due to other mental disorder: Secondary | ICD-10-CM | POA: Diagnosis not present

## 2018-02-09 DIAGNOSIS — F200 Paranoid schizophrenia: Secondary | ICD-10-CM

## 2018-02-09 DIAGNOSIS — F99 Mental disorder, not otherwise specified: Secondary | ICD-10-CM | POA: Diagnosis not present

## 2018-02-09 MED ORDER — RISPERIDONE 4 MG PO TABS: 4.0000 mg | ORAL_TABLET | Freq: Two times a day (BID) | ORAL | 1 refills | Status: DC

## 2018-02-09 MED ORDER — MIRTAZAPINE 30 MG PO TABS: 30.0000 mg | ORAL_TABLET | Freq: Every day | ORAL | 1 refills | Status: DC

## 2018-02-09 MED ORDER — TRAZODONE HCL 50 MG PO TABS: 50.0000 mg | ORAL_TABLET | Freq: Every evening | ORAL | 0 refills | Status: DC | PRN

## 2018-02-09 MED ORDER — BENZTROPINE MESYLATE 2 MG PO TABS: 2.0000 mg | ORAL_TABLET | Freq: Two times a day (BID) | ORAL | 1 refills | Status: DC

## 2018-02-09 NOTE — Progress Notes (Signed)
BH MD/PA/NP OP Progress Note  02/09/2018 2:54 PM ELSWORTH LEDIN  MRN:  [id number]  Chief Complaint: I am fine.  I need my medication.  HPI: Blake Mcknight came for his follow-up appointment with his sister.  He is taking his medication as prescribed but sometimes he does not take trazodone every night.  His sister endorse that most of the time he is sleeping better.  He is not agitated or having any behavioral problem.  Patient is a poor historian and most of information was obtained through his sister.  Recently he was seen in the emergency room because he having breathing issues and sister was concerned if he has pneumonia.  Patient is feeling better.  Sister feels that current medicine is working.  He denies any suicidal thoughts or homicidal thought.  However seen sometimes inappropriate laughing and talking to himself.  His appetite is okay.  He is taking Remeron, Risperdal, Cogentin and trazodone as needed.  Visit Diagnosis:    ICD-10-CM   1. Paranoid schizophrenia ([place]) F20.0   2. Insomnia due to other mental disorder F51.05    F99     Past Psychiatric History: Reviewed. History of schizophrenia and admitted at [facility] 25 years ago. At that time he was very paranoid, delusional, disorganized behavior and having mood swings.  Past Medical History:  Past Medical History:  Diagnosis Date  . COPD (chronic obstructive pulmonary disease) (HCC)    with bullous emphysema.   Marland Kitchen Heavy cigarette smoker before 2003   pt claims only 10 cigs per day, never heavier amounts.   Marland Kitchen HLD (hyperlipidemia)   . Hypertension   . Schizophrenia ([place]) 06/28/2013   This is a chronic condition and he lives with family    Past Surgical History:  Procedure Laterality Date  . CHOLECYSTECTOMY N/A 06/06/2013   Procedure: LAPAROSCOPIC CHOLECYSTECTOMY WITH ATTEMPTED INTRAOPERATIVE CHOLANGIOGRAM;  Surgeon: Zenovia Jarred, MD;  Location: [place];  Service: General;  Laterality: N/A;  . ERCP N/A 06/03/2013   Procedure: ENDOSCOPIC RETROGRADE CHOLANGIOPANCREATOGRAPHY (ERCP);  Surgeon: Ladene Artist, MD;  Location: [facility] ENDOSCOPY;  Service: Endoscopy;  Laterality: N/A;  . NO PAST SURGERIES      Family Psychiatric History: Reviewed.  Family History:  Family History  Problem Relation Age of Onset  . Diabetes Mellitus II Mother   . Diabetes Mother   . Heart disease Father   . Stroke Maternal Aunt   . CAD Neg Hx     Social History:  Social History   Socioeconomic History  . Marital status: Single    Spouse name: Not on file  . Number of children: Not on file  . Years of education: Not on file  . Highest education level: Not on file  Occupational History  . Not on file  Social Needs  . Financial resource strain: Not on file  . Food insecurity:    Worry: Not on file    Inability: Not on file  . Transportation needs:    Medical: Not on file    Non-medical: Not on file  Tobacco Use  . Smoking status: Current Every Day Smoker    Packs/day: 1.00    Years: 10.00    Pack years: 10.00    Types: Cigarettes  . Smokeless tobacco: Never Used  . Tobacco comment: Gets 4 puffs a day.   Substance and Sexual Activity  . Alcohol use: No  . Drug use: No  . Sexual activity: Yes  Lifestyle  . Physical activity:  Days per week: Not on file    Minutes per session: Not on file  . Stress: Not on file  Relationships  . Social connections:    Talks on phone: Not on file    Gets together: Not on file    Attends religious service: Not on file    Active member of club or organization: Not on file    Attends meetings of clubs or organizations: Not on file    Relationship status: Not on file  Other Topics Concern  . Not on file  Social History Narrative  . Not on file    Allergies: No Known Allergies  Metabolic Disorder Labs: Recent Results (from the past 2160 hour(s))  Basic metabolic panel     Status: Abnormal   Collection Time: 01/17/18  8:59 PM  Result Value Ref Range   Sodium 132  (L) 135 - 145 mmol/L   Potassium 4.1 3.5 - 5.1 mmol/L   Chloride 98 98 - 111 mmol/L   CO2 26 22 - 32 mmol/L   Glucose, Bld 100 (H) 70 - 99 mg/dL   BUN <5 (L) 6 - 20 mg/dL   Creatinine, Ser 0.82 0.61 - 1.24 mg/dL   Calcium 9.7 8.9 - 10.3 mg/dL   GFR calc non Af Amer >60 >60 mL/min   GFR calc Af Amer >60 >60 mL/min    Comment: (NOTE) The eGFR has been calculated using the CKD EPI equation. This calculation has not been validated in all clinical situations. eGFR's persistently <60 mL/min signify possible Chronic Kidney Disease.    Anion gap 8 5 - 15    Comment: Performed at [facility] [address], [place] [address]  CBC     Status: None   Collection Time: 01/17/18  8:59 PM  Result Value Ref Range   WBC 8.6 4.0 - 10.5 K/uL   RBC 5.36 4.22 - 5.81 MIL/uL   Hemoglobin 15.4 13.0 - 17.0 g/dL   HCT 48.0 39.0 - 52.0 %   MCV 89.6 80.0 - 100.0 fL   MCH 28.7 26.0 - 34.0 pg   MCHC 32.1 30.0 - 36.0 g/dL   RDW 13.0 11.5 - 15.5 %   Platelets 274 150 - 400 K/uL   nRBC 0.0 0.0 - 0.2 %    Comment: Performed at [facility], [place] [address] [place], [place] [address]  I-stat troponin, ED     Status: None   Collection Time: 01/17/18  9:21 PM  Result Value Ref Range   Troponin i, poc 0.01 0.00 - 0.08 ng/mL   Comment 3            Comment: Due to the release kinetics of cTnI, a negative result within the first hours of the onset of symptoms does not rule out myocardial infarction with certainty. If myocardial infarction is still suspected, repeat the test at appropriate intervals.    No results found for: HGBA1C, MPG No results found for: PROLACTIN No results found for: CHOL, TRIG, HDL, CHOLHDL, VLDL, LDLCALC No results found for: TSH  Therapeutic Level Labs: No results found for: LITHIUM No results found for: VALPROATE No components found for:  CBMZ  Current Medications: Current Outpatient Medications  Medication Sig Dispense Refill  . acetaminophen  (TYLENOL) 325 MG tablet Take 2 tablets (650 mg total) by mouth every 6 (six) hours as needed for mild pain, moderate pain, fever or headache (or Fever >/= 101).    Pamella Pert HFA 786-76  MCG/ACT inhaler Inhale 1 puff into the lungs 2 (two) times daily.    Marland Kitchen albuterol (PROVENTIL HFA;VENTOLIN HFA) 108 (90 Base) MCG/ACT inhaler Inhale 1-2 puffs into the lungs every 6 (six) hours as needed for wheezing or shortness of breath. (Patient taking differently: Inhale 2 puffs into the lungs every 6 (six) hours. ) 1 Inhaler 0  . albuterol (PROVENTIL) (2.5 MG/3ML) 0.083% nebulizer solution Take 3 mLs (2.5 mg total) by nebulization every 6 (six) hours as needed for wheezing or shortness of breath. May use every 4-6 hours for shortness of breath. 75 mL 12  . Ascorbic Acid (VITAMIN C PO) Take 1 tablet by mouth daily.    Marland Kitchen atorvastatin (LIPITOR) 10 MG tablet Take 10 mg by mouth daily at 6 PM.     . benztropine (COGENTIN) 2 MG tablet Take 1 tablet (2 mg total) by mouth 2 (two) times daily. 90 tablet 1  . carvedilol (COREG) 3.125 MG tablet Take 3.125 mg by mouth every 12 (twelve) hours. ONLY IF HR IS GREATER THAN 98-110  0  . Cholecalciferol (VITAMIN D PO) Take 1 tablet by mouth every other day.     . Cyanocobalamin (B-12 PO) Take 1 tablet by mouth every other day.     . enalapril (VASOTEC) 10 MG tablet Take 10 mg by mouth daily.    . mirtazapine (REMERON) 30 MG tablet Take 1 tablet (30 mg total) by mouth at bedtime. 90 tablet 1  . multivitamin (ONE-A-DAY MEN'S) TABS tablet Take 1 tablet by mouth daily.    . OXYGEN Inhale 2 L into the lungs See admin instructions. Inhale 2 liters via cannula upon exertion    . predniSONE (DELTASONE) 20 MG tablet Take 3 tablets (60 mg total) by mouth daily. 15 tablet 0  . risperidone (RISPERDAL) 4 MG tablet Take 1 tablet (4 mg total) by mouth 2 (two) times daily. 180 tablet 1  . traZODone (DESYREL) 50 MG tablet Take 1 tablet (50 mg total) by mouth at bedtime as needed for sleep. 90 tablet  0   No current facility-administered medications for this visit.      Musculoskeletal: Strength & Muscle Tone: within normal limits Gait & Station: normal Patient leans: N/A  Psychiatric Specialty Exam: ROS  Blood pressure 120/73, pulse (!) 104, height 5' 6.5" (1.689 m), weight 144 lb (65.3 kg), SpO2 93 %.Body mass index is 22.89 kg/m.  General Appearance: Fairly Groomed and superfical coopeartive  Eye Contact:  Fair  Speech:  Slow  Volume:  Decreased  Mood:  Euthymic  Affect:  Constricted  Thought Process:  Descriptions of Associations: Circumstantial  Orientation:  Full (Time, Place, and Person)  Thought Content: poverty of thought content.  At times inappropriate laughing and giggling.   Suicidal Thoughts:  No  Homicidal Thoughts:  No  Memory:  Immediate;   Fair Recent;   Fair Remote;   Fair  Judgement:  Fair  Insight:  Fair  Psychomotor Activity:  Decreased  Concentration:  Concentration: Fair and Attention Span: Fair  Recall:  [employer] of Knowledge: Fair  Language: Fair  Akathisia:  No  Handed:  Right  AIMS (if indicated): not done  Assets:  Desire for Improvement Housing Social Support  ADL's:  Intact  Cognition: Impaired,  Mild  Sleep:  Good   Screenings:   Assessment and Plan: Schizophrenia chronic paranoid type.  Primary insomnia.  Patient is a stable on his current medication.  I reviewed blood work results from the recent emergency  room visit.  Continue Risperdal 4 mg twice a day, Remeron 30 mg at bedtime, Cogentin 2 mg twice a day and trazodone 50 mg as needed.  Discussed medication side effects and benefits.  Recommended to call us back if he has any question or any concern.  Follow-up in 6 months.   Kathlee Nations, MD 02/09/2018, 2:54 PM

## 2018-04-06 ENCOUNTER — Other Ambulatory Visit (HOSPITAL_COMMUNITY): Payer: Self-pay

## 2018-04-06 DIAGNOSIS — F99 Mental disorder, not otherwise specified: Secondary | ICD-10-CM

## 2018-04-06 DIAGNOSIS — F200 Paranoid schizophrenia: Secondary | ICD-10-CM

## 2018-04-06 DIAGNOSIS — F5105 Insomnia due to other mental disorder: Secondary | ICD-10-CM

## 2018-04-06 MED ORDER — TRAZODONE HCL 50 MG PO TABS: 50.0000 mg | ORAL_TABLET | Freq: Every evening | ORAL | 0 refills | Status: DC | PRN

## 2018-05-22 ENCOUNTER — Encounter (HOSPITAL_COMMUNITY): Payer: Self-pay | Admitting: Emergency Medicine

## 2018-05-22 ENCOUNTER — Emergency Department (HOSPITAL_COMMUNITY): Payer: Medicare Other

## 2018-05-22 ENCOUNTER — Emergency Department (HOSPITAL_COMMUNITY)
Admission: EM | Admit: 2018-05-22 | Discharge: 2018-05-23 | Disposition: A | Discharge status: Home / Self Care | Payer: Medicare Other | Attending: Emergency Medicine | Admitting: Emergency Medicine

## 2018-05-22 ENCOUNTER — Other Ambulatory Visit: Payer: Self-pay

## 2018-05-22 DIAGNOSIS — F17210 Nicotine dependence, cigarettes, uncomplicated: Secondary | ICD-10-CM | POA: Diagnosis not present

## 2018-05-22 DIAGNOSIS — I10 Essential (primary) hypertension: Secondary | ICD-10-CM | POA: Diagnosis not present

## 2018-05-22 DIAGNOSIS — Z79899 Other long term (current) drug therapy: Secondary | ICD-10-CM | POA: Insufficient documentation

## 2018-05-22 DIAGNOSIS — J441 Chronic obstructive pulmonary disease with (acute) exacerbation: Secondary | ICD-10-CM | POA: Diagnosis not present

## 2018-05-22 DIAGNOSIS — R0602 Shortness of breath: Secondary | ICD-10-CM | POA: Diagnosis not present

## 2018-05-22 NOTE — ED Triage Notes (Addendum)
Pt c/o shortness of breath and "not feeling well" x 2 days. NAD noted. Denies chest pain.

## 2018-05-23 ENCOUNTER — Other Ambulatory Visit: Payer: Self-pay

## 2018-05-23 DIAGNOSIS — J441 Chronic obstructive pulmonary disease with (acute) exacerbation: Secondary | ICD-10-CM | POA: Diagnosis not present

## 2018-05-23 MED ORDER — PREDNISONE 20 MG PO TABS
60.0000 mg | ORAL_TABLET | Freq: Once | ORAL | Status: AC
  Administered 2018-05-23: 60 mg via ORAL
  Filled 2018-05-23: qty 3

## 2018-05-23 MED ORDER — PREDNISONE 20 MG PO TABS: ORAL_TABLET | ORAL | 0 refills | Status: DC

## 2018-05-23 MED ORDER — IPRATROPIUM-ALBUTEROL 0.5-2.5 (3) MG/3ML IN SOLN
3.0000 mL | Freq: Once | RESPIRATORY_TRACT | Status: AC
  Administered 2018-05-23: 3 mL via RESPIRATORY_TRACT
  Filled 2018-05-23: qty 3

## 2018-05-23 NOTE — ED Provider Notes (Signed)
[facility] EMERGENCY DEPARTMENT Provider Note   CSN: [id number] Arrival date & time: 05/22/18  2055    History   Chief Complaint Chief Complaint  Patient presents with  . Shortness of Breath    HPI Blake Mcknight is a 63 y.o. male.     The history is provided by the patient, the spouse and medical records. No language interpreter was used.  Shortness of Breath     63 year old male with history of COPD, tobacco use, hypertension, schizophrenia presenting for evaluation of shortness of breath.  History obtained through patient and through wife who is at bedside.  Patient endorsed increased shortness of breath and tightness of his lung for the past 24 hours.  Symptoms seem to be worsening with exertion or with laying flat.  Symptom is moderate in severity.  No associated fever, chills, runny nose, sneezing, productive cough, pleuritic chest pain, abdominal pain or back pain.  No nausea vomiting or diarrhea. Wife recently had URI. No recent travel.  He does use his rescue inhaler every 4 hours but have to increase use per wife.  Past Medical History:  Diagnosis Date  . COPD (chronic obstructive pulmonary disease) (HCC)    with bullous emphysema.   Marland Kitchen Heavy cigarette smoker before 2003   pt claims only 10 cigs per day, never heavier amounts.   Marland Kitchen HLD (hyperlipidemia)   . Hypertension   . Schizophrenia ([place]) 06/28/2013   This is a chronic condition and he lives with family    Patient Active Problem List   Diagnosis Date Noted  . Acute respiratory failure with hypercapnia ([place]) 06/16/2016  . Acute respiratory failure with hypoxia ([place]) 06/16/2016  . Acute on chronic respiratory failure with hypoxia ([place]) 05/25/2016  . Altered mental status 09/22/2015  . Hyponatremia 09/22/2015  . Elevated lactic acid level 09/22/2015  . Acute encephalopathy   . Bullous emphysema ([place]) 07/26/2013  . Schizophrenia ([place]) 06/28/2013  . Tobacco use disorder 06/06/2013  .  Abdominal pain 06/03/2013  . Sepsis ([place]) 06/03/2013  . Elevated LFTs 06/03/2013  . HTN (hypertension) 06/03/2013  . HLD (hyperlipidemia) 06/03/2013  . Chronic obstructive pulmonary disease with acute exacerbation ([place]) 06/03/2013  . Calculus of bile duct without mention of cholecystitis or obstruction 06/03/2013    Past Surgical History:  Procedure Laterality Date  . CHOLECYSTECTOMY N/A 06/06/2013   Procedure: LAPAROSCOPIC CHOLECYSTECTOMY WITH ATTEMPTED INTRAOPERATIVE CHOLANGIOGRAM;  Surgeon: Zenovia Jarred, MD;  Location: [place];  Service: General;  Laterality: N/A;  . ERCP N/A 06/03/2013   Procedure: ENDOSCOPIC RETROGRADE CHOLANGIOPANCREATOGRAPHY (ERCP);  Surgeon: Ladene Artist, MD;  [facility] ENDOSCOPY;  Service: Endoscopy;  Laterality: N/A;  . NO PAST SURGERIES          Home Medications    Prior to Admission medications   Medication Sig Start Date End Date Taking? Authorizing Provider  acetaminophen (TYLENOL) 325 MG tablet Take 2 tablets (650 mg total) by mouth every 6 (six) hours as needed for mild pain, moderate pain, fever or headache (or Fever >/= 101). 06/18/16   Hongalgi, Lenis Dickinson, MD  ADVA[facility] 115-21 MCG/ACT inhaler Inhale 1 puff into the lungs 2 (two) times daily. 09/21/15   [provider]  albuterol (PROVENTIL HFA;VENTOLIN HFA) 108 (90 Base) MCG/ACT inhaler Inhale 1-2 puffs into the lungs every 6 (six) hours as needed for wheezing or shortness of breath. Patient taking differently: Inhale 2 puffs into the lungs every 6 (six) hours.  07/07/17   Wieters, Elesa Hacker,  PA-C  albuterol (PROVENTIL) (2.5 MG/3ML) 0.083% nebulizer solution Take 3 mLs (2.5 mg total) by nebulization every 6 (six) hours as needed for wheezing or shortness of breath. May use every 4-6 hours for shortness of breath. 07/16/17   Antonietta Breach, PA-C  Ascorbic Acid (VITAMIN C PO) Take 1 tablet by mouth daily.    [provider]  atorvastatin (LIPITOR) 10 MG tablet Take 10 mg by mouth daily at  6 PM.  09/21/15   [provider]  benztropine (COGENTIN) 2 MG tablet Take 1 tablet (2 mg total) by mouth 2 (two) times daily. 02/09/18   Arfeen, Arlyce Harman, MD  carvedilol (COREG) 3.125 MG tablet Take 3.125 mg by mouth every 12 (twelve) hours. ONLY IF HR IS GREATER THAN 98-110 07/27/16   [provider]  Cholecalciferol (VITAMIN D PO) Take 1 tablet by mouth every other day.     [provider]  Cyanocobalamin (B-12 PO) Take 1 tablet by mouth every other day.     [provider]  enalapril (VASOTEC) 10 MG tablet Take 10 mg by mouth daily. 06/04/16   [provider]  mirtazapine (REMERON) 30 MG tablet Take 1 tablet (30 mg total) by mouth at bedtime. 02/09/18 02/09/19  Arfeen, Arlyce Harman, MD  multivitamin (ONE-A-DAY MEN'S) TABS tablet Take 1 tablet by mouth daily.    [provider]  OXYGEN Inhale 2 L into the lungs See admin instructions. Inhale 2 liters via cannula upon exertion    [provider]  predniSONE (DELTASONE) 20 MG tablet Take 3 tablets (60 mg total) by mouth daily. 01/18/18   Ward, Delice Bison, DO  risperidone (RISPERDAL) 4 MG tablet Take 1 tablet (4 mg total) by mouth 2 (two) times daily. 02/09/18   Arfeen, Arlyce Harman, MD  traZODone (DESYREL) 50 MG tablet Take 1 tablet (50 mg total) by mouth at bedtime as needed for sleep. 04/06/18   Arfeen, Arlyce Harman, MD    Family History Family History  Problem Relation Age of Onset  . Diabetes Mellitus II Mother   . Diabetes Mother   . Heart disease Father   . Stroke Maternal Aunt   . CAD Neg Hx     Social History Social History   Tobacco Use  . Smoking status: Current Every Day Smoker    Packs/day: 1.00    Years: 10.00    Pack years: 10.00    Types: Cigarettes  . Smokeless tobacco: Never Used  . Tobacco comment: Gets 4 puffs a day.   Substance Use Topics  . Alcohol use: No  . Drug use: No     Allergies   Patient has no known allergies.   Review of Systems Review of Systems    Respiratory: Positive for shortness of breath.   All other systems reviewed and are negative.    Physical Exam Updated Vital Signs BP 114/69 (BP Location: Right Arm)   Pulse 89   Temp (!) 97.3 F (36.3 C) (Oral)   Resp 16   SpO2 98%   Physical Exam Vitals signs and nursing note reviewed.  Constitutional:      General: He is not in acute distress.    Appearance: He is well-developed.  HENT:     Head: Atraumatic.  Eyes:     Conjunctiva/sclera: Conjunctivae normal.  Neck:     Musculoskeletal: Neck supple.  Cardiovascular:     Rate and Rhythm: Normal rate and regular rhythm.  Pulmonary:     Breath sounds: Decreased  breath sounds present. No wheezing, rhonchi or rales.  Chest:     Chest wall: No tenderness.  Abdominal:     Tenderness: There is no abdominal tenderness.  Musculoskeletal:     Right lower leg: No edema.     Left lower leg: No edema.  Skin:    Findings: No rash.  Neurological:     Mental Status: He is alert.  Psychiatric:        Mood and Affect: Mood normal.      ED Treatments / Results  Labs (all labs ordered are listed, but only abnormal results are displayed) Labs Reviewed - No data to display  EKG None  Radiology Dg Chest 2 View  Result Date: 05/22/2018 CLINICAL DATA:  Shortness of breath. EXAM: CHEST - 2 VIEW COMPARISON:  January 17, 2018 FINDINGS: Severe emphysematous changes, stable. No pneumothorax. No nodules or masses. No focal infiltrates. The cardiomediastinal silhouette is stable. IMPRESSION: Severe emphysematous changes. No acute interval changes. No focal infiltrate. Electronically Signed   By: Dorise Bullion III M.D   On: 05/22/2018 22:29    Procedures Procedures (including critical care time)  Medications Ordered in ED Medications  ipratropium-albuterol (DUONEB) 0.5-2.5 (3) MG/3ML nebulizer solution 3 mL (3 mLs Nebulization Given 05/23/18 0721)  predniSONE (DELTASONE) tablet 60 mg (60 mg Oral Given 05/23/18 0721)     Initial  Impression / Assessment and Plan / ED Course  I have reviewed the triage vital signs and the nursing notes.  Pertinent labs & imaging results that were available during my care of the patient were reviewed by me and considered in my medical decision making (see chart for details).        BP 97/69   Pulse 80   Temp (!) 97.3 F (36.3 C) (Oral)   Resp 18   Ht 5\' 5"  (1.651 m)   Wt 64.9 kg   SpO2 93%   BMI 23.80 kg/m    Final Clinical Impressions(s) / ED Diagnoses   Final diagnoses:  COPD exacerbation [facility])    ED Discharge Orders         Ordered    predniSONE (DELTASONE) 20 MG tablet     05/23/18 0927         7:11 AM Patient with significant history of emphysema/COPD here with increased shortness of breath.  No flulike symptoms.  No chest pain.  He is resting comfortably.  Initial chest x-ray shows severe emphysematous changes without any acute interval change or focal infiltrates.  Will provide steroids and DuoNeb.  Will ambulate patient and check O2.  Anticipate discharge.  9:26 AM Improvement with lung aeration after breathing treatment.  Patient ambulate while maintaining oxygen level of 94 to 96% on room air.  He felt better.  He is stable for discharge with course of steroid.  He will follow-up outpatient for further care.  Return precautions discussed.   Domenic Moras, PA-C 67/54/49 2010    Delora Fuel, MD 09/25/17 2245

## 2018-05-23 NOTE — ED Notes (Signed)
Pt ambulated approx 500 ft on RA.  Sats starting 96%, sats ending 94%.  Pt tolerated very well, quick and steady pace without rest breaks. Minimally SOB at end of exercise.

## 2018-05-23 NOTE — ED Notes (Signed)
Patient denies pain and is resting comfortably.  

## 2018-06-03 DIAGNOSIS — J441 Chronic obstructive pulmonary disease with (acute) exacerbation: Secondary | ICD-10-CM | POA: Diagnosis not present

## 2018-06-03 DIAGNOSIS — Z79899 Other long term (current) drug therapy: Secondary | ICD-10-CM | POA: Diagnosis not present

## 2018-06-03 DIAGNOSIS — E7800 Pure hypercholesterolemia, unspecified: Secondary | ICD-10-CM | POA: Diagnosis not present

## 2018-06-03 DIAGNOSIS — Z125 Encounter for screening for malignant neoplasm of prostate: Secondary | ICD-10-CM | POA: Diagnosis not present

## 2018-06-03 DIAGNOSIS — F319 Bipolar disorder, unspecified: Secondary | ICD-10-CM | POA: Diagnosis not present

## 2018-06-03 DIAGNOSIS — K267 Chronic duodenal ulcer without hemorrhage or perforation: Secondary | ICD-10-CM | POA: Diagnosis not present

## 2018-06-03 DIAGNOSIS — M159 Polyosteoarthritis, unspecified: Secondary | ICD-10-CM | POA: Diagnosis not present

## 2018-06-03 DIAGNOSIS — K210 Gastro-esophageal reflux disease with esophagitis: Secondary | ICD-10-CM | POA: Diagnosis not present

## 2018-06-03 DIAGNOSIS — M2550 Pain in unspecified joint: Secondary | ICD-10-CM | POA: Diagnosis not present

## 2018-06-03 DIAGNOSIS — M899 Disorder of bone, unspecified: Secondary | ICD-10-CM | POA: Diagnosis not present

## 2018-06-03 DIAGNOSIS — I119 Hypertensive heart disease without heart failure: Secondary | ICD-10-CM | POA: Diagnosis not present

## 2018-08-10 ENCOUNTER — Ambulatory Visit (INDEPENDENT_AMBULATORY_CARE_PROVIDER_SITE_OTHER): Payer: Medicare Other | Admitting: Psychiatry

## 2018-08-10 ENCOUNTER — Encounter (HOSPITAL_COMMUNITY): Payer: Self-pay | Admitting: Psychiatry

## 2018-08-10 ENCOUNTER — Other Ambulatory Visit: Payer: Self-pay

## 2018-08-10 DIAGNOSIS — F99 Mental disorder, not otherwise specified: Secondary | ICD-10-CM | POA: Diagnosis not present

## 2018-08-10 DIAGNOSIS — F200 Paranoid schizophrenia: Secondary | ICD-10-CM

## 2018-08-10 DIAGNOSIS — F5105 Insomnia due to other mental disorder: Secondary | ICD-10-CM | POA: Diagnosis not present

## 2018-08-10 MED ORDER — MIRTAZAPINE 30 MG PO TABS: 30.0000 mg | ORAL_TABLET | Freq: Every day | ORAL | 1 refills | Status: DC

## 2018-08-10 MED ORDER — BENZTROPINE MESYLATE 2 MG PO TABS: 2.0000 mg | ORAL_TABLET | Freq: Two times a day (BID) | ORAL | 1 refills | Status: DC

## 2018-08-10 MED ORDER — RISPERIDONE 4 MG PO TABS: 4.0000 mg | ORAL_TABLET | Freq: Two times a day (BID) | ORAL | 1 refills | Status: DC

## 2018-08-10 MED ORDER — TRAZODONE HCL 50 MG PO TABS: 50.0000 mg | ORAL_TABLET | Freq: Every evening | ORAL | 0 refills | Status: DC | PRN

## 2018-08-10 NOTE — Progress Notes (Signed)
Virtual Visit via Telephone Note  I connected with Blake Mcknight on 08/10/18 at  2:20 PM EDT by telephone and verified that I am speaking with the correct person using two identifiers.   I discussed the limitations, risks, security and privacy concerns of performing an evaluation and management service by telephone and the availability of in person appointments. I also discussed with the patient that there may be a patient responsible charge related to this service. The patient expressed understanding and agreed to proceed.   History of Present Illness: Patient was evaluated on phone session.  He is a poor historian and most of the information was obtained through his sister.  Patient lives with his sister.  Patient reported he is stable and taking the medication as prescribed.  Sometimes he forgets that his sister remind him to take the medicine.  Earlier this year he was seen in the emergency room because of shortness of breath and he had blood work.  Patient has upcoming appointment to see his primary care physician Dr. Katherine Roan for blood work.  His sister endorsed that sometimes he talks to himself, laughing and giggling but he is not aggressive, agitated or violent.  He still have issues at night when he goes to bed very late and wakes up but it does not bother anybody.  Patient admitted he has tremors which is chronic but does not interfere in his daily life.  His appetite is okay.  He reported his weight is stable.  He reported no side effects and like to continue his Remeron, Risperdal, Cogentin and trazodone.    Past Psychiatric History: Reviewed. History of schizophrenia and admitted at [facility] 25 years ago. At that time he was very paranoid, delusional, disorganized behavior and having mood swings.    Psychiatric Specialty Exam: Physical Exam  ROS  There were no vitals taken for this visit.There is no height or weight on file to calculate BMI.  General Appearance: NA   Eye Contact:  NA  Speech:  Slow and at times rambling  Volume:  Decreased  Mood:  Euthymic  Affect:  NA  Thought Process:  Descriptions of Associations: Circumstantial  Orientation:  Full (Time, Place, and Person)  Thought Content:  Hallucinations: Auditory and Rumination  Suicidal Thoughts:  No  Homicidal Thoughts:  No  Memory:  Immediate;   Fair Recent;   Fair Remote;   Fair  Judgement:  Fair  Insight:  Fair  Psychomotor Activity:  Tremor  Concentration:  Concentration: Fair and Attention Span: Fair  Recall:  [employer] of Knowledge:  Fair  Language:  Fair  Akathisia:  NA  Handed:  Right  AIMS (if indicated):     Assets:  Desire for Improvement Housing Social Support  ADL's:  Intact  Cognition:  Impaired,  Mild  Sleep:         Assessment and Plan: Schizophrenia chronic paranoid type.  Primary insomnia.  I reviewed blood work results which was done earlier this year.  Patient is a stable on his medication.  Continue Risperdal 4 mg twice a day, Remeron 30 mg at bedtime, Cogentin 2 mg twice a day and trazodone 50 mg at bedtime.  Discussed medication side effects and benefits.  Recommended to call us back if is any question or any concern.  Follow-up in 6 months.  Follow Up Instructions:    I discussed the assessment and treatment plan with the patient. The patient was provided an opportunity to ask questions and all  were answered. The patient agreed with the plan and demonstrated an understanding of the instructions.   The patient was advised to call back or seek an in-person evaluation if the symptoms worsen or if the condition fails to improve as anticipated.  I provided 20 minutes of non-face-to-face time during this encounter.   Kathlee Nations, MD

## 2018-09-14 DIAGNOSIS — Z139 Encounter for screening, unspecified: Secondary | ICD-10-CM | POA: Diagnosis not present

## 2018-09-14 DIAGNOSIS — M2550 Pain in unspecified joint: Secondary | ICD-10-CM | POA: Diagnosis not present

## 2018-09-14 DIAGNOSIS — E7800 Pure hypercholesterolemia, unspecified: Secondary | ICD-10-CM | POA: Diagnosis not present

## 2018-09-14 DIAGNOSIS — M899 Disorder of bone, unspecified: Secondary | ICD-10-CM | POA: Diagnosis not present

## 2018-09-14 DIAGNOSIS — M1990 Unspecified osteoarthritis, unspecified site: Secondary | ICD-10-CM | POA: Diagnosis not present

## 2018-09-14 DIAGNOSIS — E559 Vitamin D deficiency, unspecified: Secondary | ICD-10-CM | POA: Diagnosis not present

## 2018-09-14 DIAGNOSIS — Z79899 Other long term (current) drug therapy: Secondary | ICD-10-CM | POA: Diagnosis not present

## 2018-09-14 DIAGNOSIS — I119 Hypertensive heart disease without heart failure: Secondary | ICD-10-CM | POA: Diagnosis not present

## 2018-09-14 DIAGNOSIS — J441 Chronic obstructive pulmonary disease with (acute) exacerbation: Secondary | ICD-10-CM | POA: Diagnosis not present

## 2018-09-14 DIAGNOSIS — K210 Gastro-esophageal reflux disease with esophagitis: Secondary | ICD-10-CM | POA: Diagnosis not present

## 2018-09-14 DIAGNOSIS — K267 Chronic duodenal ulcer without hemorrhage or perforation: Secondary | ICD-10-CM | POA: Diagnosis not present

## 2018-09-14 DIAGNOSIS — I110 Hypertensive heart disease with heart failure: Secondary | ICD-10-CM | POA: Diagnosis not present

## 2018-09-14 DIAGNOSIS — F319 Bipolar disorder, unspecified: Secondary | ICD-10-CM | POA: Diagnosis not present

## 2018-10-05 ENCOUNTER — Other Ambulatory Visit (HOSPITAL_COMMUNITY): Payer: Self-pay

## 2018-10-05 DIAGNOSIS — F200 Paranoid schizophrenia: Secondary | ICD-10-CM

## 2018-10-05 MED ORDER — BENZTROPINE MESYLATE 2 MG PO TABS: 2.0000 mg | ORAL_TABLET | Freq: Two times a day (BID) | ORAL | 1 refills | Status: DC

## 2018-10-24 ENCOUNTER — Ambulatory Visit (HOSPITAL_COMMUNITY)
Admission: EM | Admit: 2018-10-24 | Discharge: 2018-10-24 | Disposition: A | Discharge status: Home / Self Care | Payer: Medicare Other | Attending: Family Medicine | Admitting: Family Medicine

## 2018-10-24 ENCOUNTER — Ambulatory Visit (INDEPENDENT_AMBULATORY_CARE_PROVIDER_SITE_OTHER): Payer: Medicare Other

## 2018-10-24 ENCOUNTER — Other Ambulatory Visit: Payer: Self-pay

## 2018-10-24 DIAGNOSIS — I10 Essential (primary) hypertension: Secondary | ICD-10-CM | POA: Insufficient documentation

## 2018-10-24 DIAGNOSIS — J441 Chronic obstructive pulmonary disease with (acute) exacerbation: Secondary | ICD-10-CM | POA: Insufficient documentation

## 2018-10-24 DIAGNOSIS — Z7951 Long term (current) use of inhaled steroids: Secondary | ICD-10-CM | POA: Diagnosis not present

## 2018-10-24 DIAGNOSIS — Z20828 Contact with and (suspected) exposure to other viral communicable diseases: Secondary | ICD-10-CM | POA: Diagnosis not present

## 2018-10-24 DIAGNOSIS — E785 Hyperlipidemia, unspecified: Secondary | ICD-10-CM | POA: Diagnosis not present

## 2018-10-24 DIAGNOSIS — Z79899 Other long term (current) drug therapy: Secondary | ICD-10-CM | POA: Diagnosis not present

## 2018-10-24 DIAGNOSIS — Z833 Family history of diabetes mellitus: Secondary | ICD-10-CM | POA: Diagnosis not present

## 2018-10-24 DIAGNOSIS — Z720 Tobacco use: Secondary | ICD-10-CM | POA: Diagnosis not present

## 2018-10-24 DIAGNOSIS — F17210 Nicotine dependence, cigarettes, uncomplicated: Secondary | ICD-10-CM | POA: Diagnosis not present

## 2018-10-24 DIAGNOSIS — R0602 Shortness of breath: Secondary | ICD-10-CM | POA: Diagnosis not present

## 2018-10-24 DIAGNOSIS — Z8249 Family history of ischemic heart disease and other diseases of the circulatory system: Secondary | ICD-10-CM | POA: Insufficient documentation

## 2018-10-24 MED ORDER — DEXAMETHASONE SODIUM PHOSPHATE 10 MG/ML IJ SOLN
10.0000 mg | Freq: Once | INTRAMUSCULAR | Status: AC
  Administered 2018-10-24: 10 mg via INTRAMUSCULAR

## 2018-10-24 MED ORDER — DEXAMETHASONE SODIUM PHOSPHATE 10 MG/ML IJ SOLN
INTRAMUSCULAR | Status: AC
  Filled 2018-10-24: qty 1

## 2018-10-24 MED ORDER — PREDNISONE 50 MG PO TABS: 50.0000 mg | ORAL_TABLET | Freq: Every day | ORAL | 0 refills | Status: AC

## 2018-10-24 NOTE — ED Notes (Signed)
Upon wheeling pt out following review of discharge papers, family members meet pt, review discharge papers, and state "the whole reason we brough him hear was for a CXR", as one states, "I've already been giving him prednisone yesterday".  Discussed with provider.  Agree to order CXR per family request.  Pt replaced in exam room.

## 2018-10-24 NOTE — ED Triage Notes (Signed)
Patient presents to Urgent Care with complaints of shortness of breath and productive cough since the past 4 days. Patient reports he has COPD and wears 2L of O2 at night all the time and during the day when he feels that needs it. Pt able to speak in full sentences, placed on UCC O2 at 2L/minute.

## 2018-10-24 NOTE — Discharge Instructions (Addendum)
Chest xray consistent with exacerbation of COPD/ emphysema, with inflammation present. No changes.  Please reach out to pulmonologist tomorrow for recheck.  5 days of prednisone to help with your breathing.  Covid testing pending. Will notify of any positive findings.  Tylenol as needed.  Any worsening of symptoms, difficulty breathing or increased oxygen needs please go to the ER.     CLINICAL DATA:  Congestion and shortness of breath for 1 year.   EXAM: CHEST - 2 VIEW   COMPARISON:  05/22/2018 and chest CT of 05/25/2016   FINDINGS: Moderate hyperinflation. The lung bases are excluded from the frontal radiograph. Severe bullous type emphysema again identified. Midline trachea. Normal heart size. No pleural effusion or pneumothorax. No lobar consolidation.   IMPRESSION: Severe bullous type emphysema, without acute superimposed process.     Electronically Signed   By: Abigail Miyamoto M.D.   On: 10/24/2018 19:04

## 2018-10-24 NOTE — ED Provider Notes (Signed)
Blake Mcknight    CSN: [id number] Arrival date & time: 10/24/18  1656     History   Chief Complaint Chief Complaint  Patient presents with  . Shortness of Breath  . Cough    HPI Blake Mcknight is a 63 y.o. male.   Blake Mcknight presents with complaints of increased shortness of breath  And cough. States this is chronic in nature for the past year, but worse over the past few days. No known fevers. Cough is productive. No congestion or sore throat. Uses O2 at baseline, no increased need. Has inhalers and nebulizer's. No new swelling to extremities. No Gi complaints.  No known ill contacts. Chronic chest pain but no new chest pain . No dizziness. History of COPD, htn, schizophrenia, sepsis. Patient denies cigarette use but chart review indicates he still smokes. Somewhat unreliable historian.     ROS per HPI, negative if not otherwise mentioned.      Past Medical History:  Diagnosis Date  . COPD (chronic obstructive pulmonary disease) (HCC)    with bullous emphysema.   Marland Kitchen Heavy cigarette smoker before 2003   pt claims only 10 cigs per day, never heavier amounts.   Marland Kitchen HLD (hyperlipidemia)   . Hypertension   . Schizophrenia ([place]) 06/28/2013   This is a chronic condition and he lives with family    Patient Active Problem List   Diagnosis Date Noted  . Acute respiratory failure with hypercapnia ([place]) 06/16/2016  . Acute respiratory failure with hypoxia ([place]) 06/16/2016  . Acute on chronic respiratory failure with hypoxia ([place]) 05/25/2016  . Altered mental status 09/22/2015  . Hyponatremia 09/22/2015  . Elevated lactic acid level 09/22/2015  . Acute encephalopathy   . Bullous emphysema ([place]) 07/26/2013  . Schizophrenia ([place]) 06/28/2013  . Tobacco use disorder 06/06/2013  . Abdominal pain 06/03/2013  . Sepsis ([place]) 06/03/2013  . Elevated LFTs 06/03/2013  . HTN (hypertension) 06/03/2013  . HLD (hyperlipidemia) 06/03/2013  . Chronic obstructive  pulmonary disease with acute exacerbation ([place]) 06/03/2013  . Calculus of bile duct without mention of cholecystitis or obstruction 06/03/2013    Past Surgical History:  Procedure Laterality Date  . CHOLECYSTECTOMY N/A 06/06/2013   Procedure: LAPAROSCOPIC CHOLECYSTECTOMY WITH ATTEMPTED INTRAOPERATIVE CHOLANGIOGRAM;  Surgeon: Zenovia Jarred, MD;  Location: [place];  Service: General;  Laterality: N/A;  . ERCP N/A 06/03/2013   Procedure: ENDOSCOPIC RETROGRADE CHOLANGIOPANCREATOGRAPHY (ERCP);  Surgeon: Ladene Artist, MD;  Location: [facility] ENDOSCOPY;  Service: Endoscopy;  Laterality: N/A;  . NO PAST SURGERIES         Home Medications    Prior to Admission medications   Medication Sig Start Date End Date Taking? Authorizing Provider  acetaminophen (TYLENOL) 325 MG tablet Take 2 tablets (650 mg total) by mouth every 6 (six) hours as needed for mild pain, moderate pain, fever or headache (or Fever >/= 101). 06/18/16   Hongalgi, Lenis Dickinson, MD  ADVAIR [facility] 115-21 MCG/ACT inhaler Inhale 1 puff into the lungs 2 (two) times daily. 09/21/15   [provider]  albuterol (PROVENTIL HFA;VENTOLIN HFA) 108 (90 Base) MCG/ACT inhaler Inhale 1-2 puffs into the lungs every 6 (six) hours as needed for wheezing or shortness of breath. Patient taking differently: Inhale 2 puffs into the lungs every 6 (six) hours.  07/07/17   Wieters, Hallie C, PA-C  albuterol (PROVENTIL) (2.5 MG/3ML) 0.083% nebulizer solution Take 3 mLs (2.5 mg total) by nebulization every 6 (six) hours as needed for wheezing or shortness  of breath. May use every 4-6 hours for shortness of breath. 07/16/17   Antonietta Breach, PA-C  Ascorbic Acid (VITAMIN C PO) Take 1 tablet by mouth daily.    [provider]  atorvastatin (LIPITOR) 10 MG tablet Take 10 mg by mouth daily at 6 PM.  09/21/15   [provider]  benztropine (COGENTIN) 2 MG tablet Take 1 tablet (2 mg total) by mouth 2 (two) times daily. 10/05/18   Arfeen, Arlyce Harman, MD  carvedilol  (COREG) 3.125 MG tablet Take 3.125 mg by mouth every 12 (twelve) hours. ONLY IF HR IS GREATER THAN 98-110 07/27/16   [provider]  Cholecalciferol (VITAMIN D PO) Take 1 tablet by mouth every other day.     [provider]  Cyanocobalamin (B-12 PO) Take 1 tablet by mouth every other day.     [provider]  enalapril (VASOTEC) 10 MG tablet Take 10 mg by mouth daily. 06/04/16   [provider]  mirtazapine (REMERON) 30 MG tablet Take 1 tablet (30 mg total) by mouth at bedtime. 08/10/18 08/10/19  Arfeen, Arlyce Harman, MD  multivitamin (ONE-A-DAY MEN'S) TABS tablet Take 1 tablet by mouth daily.    [provider]  OXYGEN Inhale 2 L into the lungs See admin instructions. Inhale 2 liters via cannula upon exertion    [provider]  predniSONE (DELTASONE) 50 MG tablet Take 1 tablet (50 mg total) by mouth daily with breakfast for 5 days. 10/24/18 10/29/18  Zigmund Gottron, NP  risperidone (RISPERDAL) 4 MG tablet Take 1 tablet (4 mg total) by mouth 2 (two) times daily. 08/10/18   Arfeen, Arlyce Harman, MD  traZODone (DESYREL) 50 MG tablet Take 1 tablet (50 mg total) by mouth at bedtime as needed for sleep. 08/10/18   Arfeen, Arlyce Harman, MD    Family History Family History  Problem Relation Age of Onset  . Diabetes Mellitus II Mother   . Diabetes Mother   . Heart disease Father   . Stroke Maternal Aunt   . CAD Neg Hx     Social History Social History   Tobacco Use  . Smoking status: Current Every Day Smoker    Packs/day: 1.00    Years: 10.00    Pack years: 10.00    Types: Cigarettes  . Smokeless tobacco: Never Used  . Tobacco comment: Gets 4 puffs a day.   Substance Use Topics  . Alcohol use: No  . Drug use: No     Allergies   Patient has no known allergies.   Review of Systems Review of Systems   Physical Exam Triage Vital Signs ED Triage Vitals  Enc Vitals Group     BP 10/24/18 1710 (!) 139/98     Pulse Rate 10/24/18 1710 97     Resp  10/24/18 1710 (!) 22     Temp 10/24/18 1710 97.9 F (36.6 C)     Temp Source 10/24/18 1710 Oral     SpO2 10/24/18 1710 96 %     Weight --      Height --      Head Circumference --      Peak Flow --      Pain Score 10/24/18 1705 0     Pain Loc --      Pain Edu? --      Excl. in [place]? --    No data found.  Updated Vital Signs BP (!) 139/98 (BP Location: Right Arm)   Pulse 97  Temp 97.9 F (36.6 C) (Oral)   Resp (!) 22   SpO2 96%    Physical Exam Constitutional:      Appearance: He is well-developed.  Cardiovascular:     Rate and Rhythm: Normal rate and regular rhythm.  Pulmonary:     Effort: Pulmonary effort is normal.     Breath sounds: Examination of the right-lower field reveals decreased breath sounds. Examination of the left-lower field reveals decreased breath sounds. Decreased breath sounds present.     Comments: No increased work of breathing noted. Resting comfortably with O2 in place. No cough throughout exam. No significant wheezing Skin:    General: Skin is warm and dry.  Neurological:     Mental Status: He is alert and oriented to person, place, and time.     Comments: Patient questionable historian as exam progresses      UC Treatments / Results  Labs (all labs ordered are listed, but only abnormal results are displayed) Labs Reviewed  NOVEL CORONAVIRUS, NAA (HOSPITAL ORDER, SEND-OUT TO REF LAB)    EKG   Radiology Dg Chest 2 View  Result Date: 10/24/2018 CLINICAL DATA:  Congestion and shortness of breath for 1 year. EXAM: CHEST - 2 VIEW COMPARISON:  05/22/2018 and chest CT of 05/25/2016 FINDINGS: Moderate hyperinflation. The lung bases are excluded from the frontal radiograph. Severe bullous type emphysema again identified. Midline trachea. Normal heart size. No pleural effusion or pneumothorax. No lobar consolidation. IMPRESSION: Severe bullous type emphysema, without acute superimposed process. Electronically Signed   By: Abigail Miyamoto M.D.   On:  10/24/2018 19:04    Procedures Procedures (including critical care time)  Medications Ordered in UC Medications  dexamethasone (DECADRON) injection 10 mg (10 mg Intramuscular Given 10/24/18 1853)  dexamethasone (DECADRON) 10 MG/ML injection (has no administration in time range)    Initial Impression / Assessment and Plan / UC Course  I have reviewed the triage vital signs and the nursing notes.  Pertinent labs & imaging results that were available during my care of the patient were reviewed by me and considered in my medical decision making (see chart for details).     Vitals stable with O2 in place. No increased work of breathing, afebrile. Reviewed charting from multiple similar visits, last chest xray 05/22/2018 without acute findings. Patient was set to dc with course of prednisone. Nursing assisted patient out, family members outside stating that patient has taken prednisone and the only reason then brought him was for a chest xray. Patient was brought back to clinic, agreeable to chest xray at this time due to recent worsening of breathing symptoms.    Chest xray without acute findings. im decadron provided to further aid in symptoms. Return precautions provided.  Final Clinical Impressions(s) / UC Diagnoses   Final diagnoses:  COPD exacerbation [facility])     Discharge Instructions     Chest xray consistent with exacerbation of COPD/ emphysema, with inflammation present. No changes.  Please reach out to pulmonologist tomorrow for recheck.  5 days of prednisone to help with your breathing.  Covid testing pending. Will notify of any positive findings.  Tylenol as needed.  Any worsening of symptoms, difficulty breathing or increased oxygen needs please go to the ER.     CLINICAL DATA:  Congestion and shortness of breath for 1 year.   EXAM: CHEST - 2 VIEW   COMPARISON:  05/22/2018 and chest CT of 05/25/2016   FINDINGS: Moderate hyperinflation. The lung bases are excluded  from  the frontal radiograph. Severe bullous type emphysema again identified. Midline trachea. Normal heart size. No pleural effusion or pneumothorax. No lobar consolidation.   IMPRESSION: Severe bullous type emphysema, without acute superimposed process.     Electronically Signed   By: Abigail Miyamoto M.D.   On: 10/24/2018 19:04    ED Prescriptions    Medication Sig Dispense Auth. Provider   predniSONE (DELTASONE) 50 MG tablet Take 1 tablet (50 mg total) by mouth daily with breakfast for 5 days. 5 tablet Zigmund Gottron, NP     Controlled Substance Prescriptions [place] Controlled Substance Registry consulted? Not Applicable   Zigmund Gottron, NP 10/24/18 1910

## 2018-10-24 NOTE — ED Notes (Signed)
Pt's family refusing to take patient home without having a chest x-ray done, provider aware. Pt brought back to exam room for further imaging.

## 2018-10-25 LAB — NOVEL CORONAVIRUS, NAA (HOSP ORDER, SEND-OUT TO REF LAB; TAT 18-24 HRS): SARS-CoV-2, NAA: NOT DETECTED

## 2018-11-09 ENCOUNTER — Other Ambulatory Visit: Payer: Self-pay

## 2018-11-09 ENCOUNTER — Encounter (HOSPITAL_COMMUNITY): Payer: Self-pay | Admitting: Psychiatry

## 2018-11-09 ENCOUNTER — Ambulatory Visit (INDEPENDENT_AMBULATORY_CARE_PROVIDER_SITE_OTHER): Payer: Medicare Other | Admitting: Psychiatry

## 2018-11-09 DIAGNOSIS — F99 Mental disorder, not otherwise specified: Secondary | ICD-10-CM

## 2018-11-09 DIAGNOSIS — F200 Paranoid schizophrenia: Secondary | ICD-10-CM

## 2018-11-09 DIAGNOSIS — F5105 Insomnia due to other mental disorder: Secondary | ICD-10-CM

## 2018-11-09 MED ORDER — BENZTROPINE MESYLATE 2 MG PO TABS: 2.0000 mg | ORAL_TABLET | Freq: Two times a day (BID) | ORAL | 0 refills | Status: DC

## 2018-11-09 MED ORDER — RISPERIDONE 4 MG PO TABS: 4.0000 mg | ORAL_TABLET | Freq: Two times a day (BID) | ORAL | 0 refills | Status: DC

## 2018-11-09 MED ORDER — TRAZODONE HCL 50 MG PO TABS: 50.0000 mg | ORAL_TABLET | Freq: Every evening | ORAL | 0 refills | Status: DC | PRN

## 2018-11-09 MED ORDER — MIRTAZAPINE 30 MG PO TABS: 30.0000 mg | ORAL_TABLET | Freq: Every day | ORAL | 0 refills | Status: DC

## 2018-11-09 NOTE — Progress Notes (Signed)
Virtual Visit via Telephone Note  I connected with Blake Mcknight on 11/09/18 at  1:00 PM EDT by telephone and verified that I am speaking with the correct person using two identifiers.   I discussed the limitations, risks, security and privacy concerns of performing an evaluation and management service by telephone and the availability of in person appointments. I also discussed with the patient that there may be a patient responsible charge related to this service. The patient expressed understanding and agreed to proceed.   History of Present Illness: Patient was evaluated by phone session.  Most of the information was obtained through his sister as patient is a poor historian.  His medicine is supervised by his sister who reported the patient is doing good on his medication.  He is not agitated, angry, irritable or having any mood swing.  Patient admitted continue to hear voices that people calling and talking to him but sometime he did not understand very well.  He denies any command auditory hallucination.  He has paranoia and does not leave and talk to strangers.  Recently he was seen in the emergency room because of shortness of breath.  His COVID test was negative.  His sister endorsed some time to talk to himself, laughing and giggling but denied any aggression, violence.  He sleeps most of the time at night.  He has tremors which are chronic.  His appetite is okay.  His weight reported to be stable.  He denies drinking or using any illegal substances.  He is a scheduled to see his primary care physician Dr. Leonel Ramsay next week.   Past Psychiatric History:Reviewed. History of schizophrenia and admitted at [facility] years [facility]. At that time he was very paranoid, delusional, disorganized behavior and having mood swings.   Recent Results (from the past 2160 hour(s))  Novel Coronavirus, NAA (hospital order; send-out to ref lab)     Status: None   Collection Time: 10/24/18  5:13  PM   Specimen: Oropharyngeal swab; Respiratory  Result Value Ref Range   SARS-CoV-2, NAA NOT DETECTED NOT DETECTED    Comment: (NOTE) This test was developed and its performance characteristics determined by [employer]. This test has not been FDA cleared or approved. This test has been authorized by FDA under an Emergency Use Authorization (EUA). This test is only authorized for the duration of time the declaration that circumstances exist justifying the authorization of the emergency use of in vitro diagnostic tests for detection of SARS-CoV-2 virus and/or diagnosis of COVID-19 infection under section 564(b)(1) of the Act, 21 U.S.C. KA:123727), unless the authorization is terminated or revoked sooner. When diagnostic testing is negative, the possibility of a false negative result should be considered in the context of a patient's recent exposures and the presence of clinical signs and symptoms consistent with COVID-19. An individual without symptoms of COVID-19 and who is not shedding SARS-CoV-2 virus would expect to have a negative (not detected) result in this assay. Performed  At: [facility] [place], [place] [id number] Rush Farmer MD [id number]    Coronavirus Source NASOPHARYNGEAL     Comment: Performed at [facility], [place] [address] [place], [place] [address]     Psychiatric Specialty Exam: Physical Exam  ROS  There were no vitals taken for this visit.There is no height or weight on file to calculate BMI.  General Appearance: NA  Eye Contact:  NA  Speech:  Slow and rambling at times  Volume:  Decreased  Mood:  Euthymic  Affect:  NA  Thought Process:  Descriptions of Associations: Circumstantial  Orientation:  Full (Time, Place, and Person)  Thought Content:  Hallucinations: Auditory hearing voices people talking to me. No command hallucination and Paranoid Ideation  Suicidal Thoughts:  No  Homicidal Thoughts:  No   Memory:  Immediate;   Fair Recent;   Fair Remote;   Poor  Judgement:  Fair  Insight:  Fair  Psychomotor Activity:  Tremor  Concentration:  Concentration: Fair and Attention Span: Fair  Recall:  [employer] of Knowledge:  Fair  Language:  Fair  Akathisia:  NA  Handed:  Right  AIMS (if indicated):     Assets:  Desire for Improvement Housing Social Support  ADL's:  Intact  Cognition:  Impaired,  Mild  Sleep:   ok      Assessment and Plan: Schizophrenia chronic paranoid type.  Primary insomnia.  I reviewed his blood work results from recent emergency room visit.  His COVID test was negative.  Patient and his sister wants to continue his current medication.  I did talk to him to consider lowering the Risperdal in the future since he is pretty stable but dose is very higher.  Patient and his sister reluctant to cut down but will consider on the next appointment.  I will continue Cogentin 2 mg twice a day, trazodone 50 mg at bedtime, Remeron 30 mg at bedtime and Risperdal 4 mg twice a day.  Recommended to call us back if he has any question or any concern.  Follow-up in 3 months.  Follow Up Instructions:    I discussed the assessment and treatment plan with the patient. The patient was provided an opportunity to ask questions and all were answered. The patient agreed with the plan and demonstrated an understanding of the instructions.   The patient was advised to call back or seek an in-person evaluation if the symptoms worsen or if the condition fails to improve as anticipated.  I provided 20 minutes of non-face-to-face time during this encounter.   Kathlee Nations, MD

## 2018-11-10 ENCOUNTER — Ambulatory Visit (HOSPITAL_COMMUNITY): Payer: Medicare Other | Admitting: Psychiatry

## 2018-12-14 DIAGNOSIS — E559 Vitamin D deficiency, unspecified: Secondary | ICD-10-CM | POA: Diagnosis not present

## 2018-12-14 DIAGNOSIS — E7800 Pure hypercholesterolemia, unspecified: Secondary | ICD-10-CM | POA: Diagnosis not present

## 2018-12-14 DIAGNOSIS — M159 Polyosteoarthritis, unspecified: Secondary | ICD-10-CM | POA: Diagnosis not present

## 2018-12-14 DIAGNOSIS — J441 Chronic obstructive pulmonary disease with (acute) exacerbation: Secondary | ICD-10-CM | POA: Diagnosis not present

## 2018-12-14 DIAGNOSIS — K210 Gastro-esophageal reflux disease with esophagitis: Secondary | ICD-10-CM | POA: Diagnosis not present

## 2018-12-14 DIAGNOSIS — I119 Hypertensive heart disease without heart failure: Secondary | ICD-10-CM | POA: Diagnosis not present

## 2018-12-14 DIAGNOSIS — K267 Chronic duodenal ulcer without hemorrhage or perforation: Secondary | ICD-10-CM | POA: Diagnosis not present

## 2018-12-14 DIAGNOSIS — M899 Disorder of bone, unspecified: Secondary | ICD-10-CM | POA: Diagnosis not present

## 2018-12-14 DIAGNOSIS — Z79899 Other long term (current) drug therapy: Secondary | ICD-10-CM | POA: Diagnosis not present

## 2018-12-14 DIAGNOSIS — M2550 Pain in unspecified joint: Secondary | ICD-10-CM | POA: Diagnosis not present

## 2018-12-14 DIAGNOSIS — F319 Bipolar disorder, unspecified: Secondary | ICD-10-CM | POA: Diagnosis not present

## 2019-01-25 ENCOUNTER — Other Ambulatory Visit (HOSPITAL_COMMUNITY): Payer: Self-pay

## 2019-01-25 DIAGNOSIS — F200 Paranoid schizophrenia: Secondary | ICD-10-CM

## 2019-01-25 MED ORDER — MIRTAZAPINE 30 MG PO TABS: 30.0000 mg | ORAL_TABLET | Freq: Every day | ORAL | 0 refills | Status: DC

## 2019-01-25 MED ORDER — BENZTROPINE MESYLATE 2 MG PO TABS: 2.0000 mg | ORAL_TABLET | Freq: Two times a day (BID) | ORAL | 0 refills | Status: DC

## 2019-01-26 ENCOUNTER — Other Ambulatory Visit (HOSPITAL_COMMUNITY): Payer: Self-pay | Admitting: Psychiatry

## 2019-01-26 DIAGNOSIS — F200 Paranoid schizophrenia: Secondary | ICD-10-CM

## 2019-01-29 ENCOUNTER — Other Ambulatory Visit: Payer: Self-pay

## 2019-01-29 ENCOUNTER — Ambulatory Visit (INDEPENDENT_AMBULATORY_CARE_PROVIDER_SITE_OTHER): Payer: Medicare Other

## 2019-01-29 ENCOUNTER — Encounter (HOSPITAL_COMMUNITY): Payer: Self-pay

## 2019-01-29 ENCOUNTER — Ambulatory Visit (HOSPITAL_COMMUNITY)
Admission: EM | Admit: 2019-01-29 | Discharge: 2019-01-29 | Disposition: A | Discharge status: Home / Self Care | Payer: Medicare Other | Attending: Family Medicine | Admitting: Family Medicine

## 2019-01-29 DIAGNOSIS — R319 Hematuria, unspecified: Secondary | ICD-10-CM | POA: Diagnosis not present

## 2019-01-29 DIAGNOSIS — R0602 Shortness of breath: Secondary | ICD-10-CM | POA: Diagnosis not present

## 2019-01-29 DIAGNOSIS — J441 Chronic obstructive pulmonary disease with (acute) exacerbation: Secondary | ICD-10-CM

## 2019-01-29 DIAGNOSIS — R05 Cough: Secondary | ICD-10-CM | POA: Diagnosis not present

## 2019-01-29 LAB — POCT URINALYSIS DIP (DEVICE)
Bilirubin Urine: NEGATIVE
Glucose, UA: NEGATIVE mg/dL
Ketones, ur: NEGATIVE mg/dL
Leukocytes,Ua: NEGATIVE
Nitrite: NEGATIVE
Protein, ur: NEGATIVE mg/dL
Specific Gravity, Urine: 1.015 (ref 1.005–1.030)
Urobilinogen, UA: 0.2 mg/dL (ref 0.0–1.0)
pH: 6.5 (ref 5.0–8.0)

## 2019-01-29 MED ORDER — PREDNISONE 10 MG PO TABS: 40.0000 mg | ORAL_TABLET | Freq: Every day | ORAL | 0 refills | Status: AC

## 2019-01-29 MED ORDER — AZITHROMYCIN 250 MG PO TABS: 250.0000 mg | ORAL_TABLET | Freq: Every day | ORAL | 0 refills | Status: DC

## 2019-01-29 NOTE — Discharge Instructions (Signed)
Treating for a COPD exacerbation Take the medication as prescribed If symptoms worsen he will need to go to the ER. There was slight blood in the urine but no signs of infection.  He can follow-up with urology for this.

## 2019-01-29 NOTE — ED Triage Notes (Signed)
Pt present Chest congestion with SOB and blood in his urinary. Pt is having SOB when he walks or stands. Pt has already been tested for Covid 19. Symptoms been going on since August.

## 2019-01-31 NOTE — ED Provider Notes (Signed)
Blake Mcknight    CSN: [id number] Arrival date & time: 01/29/19  1410      History   Chief Complaint Chief Complaint  Patient presents with   Hematuria   Shortness of Breath   Chest congestion    HPI Blake Mcknight is a 63 y.o. male.   Patient is a 63 year old male with past medical history of COPD, schizophrenia, hyperlipidemia and hypertension.  He presents today with increased cough, increased sputum production and mild shortness of breath.  Symptoms have been constant and worsening for the past couple months.  He has been using his inhalers.  Tested negative for Covid.  Shortness of breath is worse when he walks or stands.  Patient is on home O2 at 2 L.  No associated fever, chills, body aches, night sweats.  No recent sick contacts or recent traveling.  No history of DVT or PE.  No lower extremity edema or chest pain.  Family also noticed some blood in his urine.  No dysuria, urinary retention or urinary frequency.  No abdominal pain, back pain  ROS per HPI      Past Medical History:  Diagnosis Date   COPD (chronic obstructive pulmonary disease) ([place])    with bullous emphysema.    Heavy cigarette smoker before 2003   pt claims only 10 cigs per day, never heavier amounts.    HLD (hyperlipidemia)    Hypertension    Schizophrenia ([place]) 06/28/2013   This is a chronic condition and he lives with family    Patient Active Problem List   Diagnosis Date Noted   Acute respiratory failure with hypercapnia ([place]) 06/16/2016   Acute respiratory failure with hypoxia ([place]) 06/16/2016   Acute on chronic respiratory failure with hypoxia ([place]) 05/25/2016   Altered mental status 09/22/2015   Hyponatremia 09/22/2015   Elevated lactic acid level 09/22/2015   Acute encephalopathy    Bullous emphysema ([place]) 07/26/2013   Schizophrenia ([place]) 06/28/2013   Tobacco use disorder 06/06/2013   Abdominal pain 06/03/2013   Sepsis ([place]) 06/03/2013    Elevated LFTs 06/03/2013   HTN (hypertension) 06/03/2013   HLD (hyperlipidemia) 06/03/2013   Chronic obstructive pulmonary disease with acute exacerbation ([place]) 06/03/2013   Calculus of bile duct without mention of cholecystitis or obstruction 06/03/2013    Past Surgical History:  Procedure Laterality Date   CHOLECYSTECTOMY N/A 06/06/2013   Procedure: LAPAROSCOPIC CHOLECYSTECTOMY WITH ATTEMPTED INTRAOPERATIVE CHOLANGIOGRAM;  Surgeon: Zenovia Jarred, MD;  Location: [facility];  Service: General;  Laterality: N/A;   ERCP N/A 06/03/2013   Procedure: ENDOSCOPIC RETROGRADE CHOLANGIOPANCREATOGRAPHY (ERCP);  Surgeon: Ladene Artist, MD;  Location: [facility] ENDOSCOPY;  Service: Endoscopy;  Laterality: N/A;   NO PAST SURGERIES         Home Medications    Prior to Admission medications   Medication Sig Start Date End Date Taking? Authorizing Provider  acetaminophen (TYLENOL) 325 MG tablet Take 2 tablets (650 mg total) by mouth every 6 (six) hours as needed for mild pain, moderate pain, fever or headache (or Fever >/= 101). 06/18/16   Hongalgi, Lenis Dickinson, MD  ADVAIR [facility] 115-21 MCG/ACT inhaler Inhale 1 puff into the lungs 2 (two) times daily. 09/21/15   [provider]  albuterol (PROVENTIL HFA;VENTOLIN HFA) 108 (90 Base) MCG/ACT inhaler Inhale 1-2 puffs into the lungs every 6 (six) hours as needed for wheezing or shortness of breath. Patient taking differently: Inhale 2 puffs into the lungs every 6 (six) hours.  07/07/17   Wieters,  Hallie C, PA-C  albuterol (PROVENTIL) (2.5 MG/3ML) 0.083% nebulizer solution Take 3 mLs (2.5 mg total) by nebulization every 6 (six) hours as needed for wheezing or shortness of breath. May use every 4-6 hours for shortness of breath. 07/16/17   Antonietta Breach, PA-C  Ascorbic Acid (VITAMIN C PO) Take 1 tablet by mouth daily.    [provider]  atorvastatin (LIPITOR) 10 MG tablet Take 10 mg by mouth daily at 6 PM.  09/21/15   [provider]  azithromycin  (ZITHROMAX) 250 MG tablet Take 1 tablet (250 mg total) by mouth daily. Take first 2 tablets together, then 1 every day until finished. 01/29/19   Loura Halt A, NP  benztropine (COGENTIN) 2 MG tablet Take 1 tablet (2 mg total) by mouth 2 (two) times daily. 01/25/19   Arfeen, Arlyce Harman, MD  carvedilol (COREG) 3.125 MG tablet Take 3.125 mg by mouth every 12 (twelve) hours. ONLY IF HR IS GREATER THAN 98-110 07/27/16   [provider]  Cholecalciferol (VITAMIN D PO) Take 1 tablet by mouth every other day.     [provider]  Cyanocobalamin (B-12 PO) Take 1 tablet by mouth every other day.     [provider]  enalapril (VASOTEC) 10 MG tablet Take 10 mg by mouth daily. 06/04/16   [provider]  mirtazapine (REMERON) 30 MG tablet Take 1 tablet (30 mg total) by mouth at bedtime. 01/25/19 01/25/20  Arfeen, Arlyce Harman, MD  multivitamin (ONE-A-DAY MEN'S) TABS tablet Take 1 tablet by mouth daily.    [provider]  OXYGEN Inhale 2 L into the lungs See admin instructions. Inhale 2 liters via cannula upon exertion    [provider]  predniSONE (DELTASONE) 10 MG tablet Take 4 tablets (40 mg total) by mouth daily for 5 days. 01/29/19 02/03/19  Loura Halt A, NP  risperidone (RISPERDAL) 4 MG tablet Take 1 tablet (4 mg total) by mouth 2 (two) times daily. 11/09/18   Arfeen, Arlyce Harman, MD  traZODone (DESYREL) 50 MG tablet Take 1 tablet (50 mg total) by mouth at bedtime as needed for sleep. 11/09/18   Arfeen, Arlyce Harman, MD    Family History Family History  Problem Relation Age of Onset   Diabetes Mellitus II Mother    Diabetes Mother    Heart disease Father    Stroke Maternal Aunt    CAD Neg Hx     Social History Social History   Tobacco Use   Smoking status: Current Every Day Smoker    Packs/day: 1.00    Years: 10.00    Pack years: 10.00    Types: Cigarettes   Smokeless tobacco: Never Used   Tobacco comment: Gets 4 puffs a day.   Substance Use Topics    Alcohol use: No   Drug use: No     Allergies   Patient has no known allergies.   Review of Systems Review of Systems   Physical Exam Triage Vital Signs ED Triage Vitals  Enc Vitals Group     BP 01/29/19 1505 (!) 144/77     Pulse Rate 01/29/19 1505 (!) 114     Resp 01/29/19 1505 20     Temp 01/29/19 1505 (!) 97.5 F (36.4 C)     Temp Source 01/29/19 1505 Oral     SpO2 01/29/19 1505 98 %     Weight --      Height --      Head Circumference --  Peak Flow --      Pain Score 01/29/19 1509 0     Pain Loc --      Pain Edu? --      Excl. in [place]? --    No data found.  Updated Vital Signs BP (!) 144/77 (BP Location: Right Arm)    Pulse (!) 114    Temp (!) 97.5 F (36.4 C) (Oral)    Resp 20    SpO2 98%   Visual Acuity Right Eye Distance:   Left Eye Distance:   Bilateral Distance:    Right Eye Near:   Left Eye Near:    Bilateral Near:     Physical Exam Vitals signs and nursing note reviewed.  Constitutional:      Appearance: He is well-developed.  HENT:     Head: Normocephalic and atraumatic.  Neck:     Musculoskeletal: Normal range of motion.  Cardiovascular:     Rate and Rhythm: Normal rate and regular rhythm.  Pulmonary:     Breath sounds: Wheezing and rhonchi present.     Comments: Mild dyspnea Musculoskeletal: Normal range of motion.     Right lower leg: No edema.     Left lower leg: No edema.  Skin:    General: Skin is warm and dry.  Neurological:     Mental Status: He is alert.  Psychiatric:        Mood and Affect: Mood normal.      UC Treatments / Results  Labs (all labs ordered are listed, but only abnormal results are displayed) Labs Reviewed  POCT URINALYSIS DIP (DEVICE) - Abnormal; Notable for the following components:      Result Value   Hgb urine dipstick TRACE (*)    All other components within normal limits    EKG   Radiology Dg Chest 2 View  Result Date: 01/29/2019 CLINICAL DATA:  Shortness of breath, cough,  emphysema EXAM: CHEST - 2 VIEW COMPARISON:  10/24/2018 FINDINGS: The heart size and mediastinal contours are within normal limits. Unusually severe, bullous emphysema. The visualized skeletal structures are unremarkable. IMPRESSION: Unusually severe, bullous emphysema without acute appearing airspace opacity. Electronically Signed   By: Eddie Candle M.D.   On: 01/29/2019 16:58    Procedures Procedures (including critical care time)  Medications Ordered in UC Medications - No data to display  Initial Impression / Assessment and Plan / UC Course  I have reviewed the triage vital signs and the nursing notes.  Pertinent labs & imaging results that were available during my care of the patient were reviewed by me and considered in my medical decision making (see chart for details).     COPD exacerbation We will treat with prednisone over the next 5 days and azithromycin. He can continue his albuterol inhaler and nebulizer treatments as needed  Hematuria-no signs of urinary tract infection.  This could be a benign finding.  Recommended if this continues to follow-up with urology. Final Clinical Impressions(s) / UC Diagnoses   Final diagnoses:  COPD exacerbation [facility])     Discharge Instructions     Treating for a COPD exacerbation Take the medication as prescribed If symptoms worsen he will need to go to the ER. There was slight blood in the urine but no signs of infection.  He can follow-up with urology for this.    ED Prescriptions    Medication Sig Dispense Auth. Provider   azithromycin (ZITHROMAX) 250 MG tablet Take 1 tablet (250 mg total) by  mouth daily. Take first 2 tablets together, then 1 every day until finished. 6 tablet Sarayah Bacchi A, NP   predniSONE (DELTASONE) 10 MG tablet Take 4 tablets (40 mg total) by mouth daily for 5 days. 20 tablet Loura Halt A, NP     PDMP not reviewed this encounter.   Loura Halt A, NP 01/31/19 1050

## 2019-02-07 ENCOUNTER — Ambulatory Visit (INDEPENDENT_AMBULATORY_CARE_PROVIDER_SITE_OTHER): Payer: Medicare Other | Admitting: Psychiatry

## 2019-02-07 ENCOUNTER — Encounter (HOSPITAL_COMMUNITY): Payer: Self-pay | Admitting: Psychiatry

## 2019-02-07 ENCOUNTER — Other Ambulatory Visit: Payer: Self-pay

## 2019-02-07 DIAGNOSIS — F99 Mental disorder, not otherwise specified: Secondary | ICD-10-CM | POA: Diagnosis not present

## 2019-02-07 DIAGNOSIS — F200 Paranoid schizophrenia: Secondary | ICD-10-CM

## 2019-02-07 DIAGNOSIS — F5105 Insomnia due to other mental disorder: Secondary | ICD-10-CM

## 2019-02-07 MED ORDER — TRAZODONE HCL 50 MG PO TABS: 50.0000 mg | ORAL_TABLET | Freq: Every evening | ORAL | 0 refills | Status: DC | PRN

## 2019-02-07 MED ORDER — RISPERIDONE 4 MG PO TABS: 4.0000 mg | ORAL_TABLET | Freq: Two times a day (BID) | ORAL | 0 refills | Status: DC

## 2019-02-07 MED ORDER — MIRTAZAPINE 30 MG PO TABS: 30.0000 mg | ORAL_TABLET | Freq: Every day | ORAL | 0 refills | Status: DC

## 2019-02-07 MED ORDER — BENZTROPINE MESYLATE 2 MG PO TABS: 2.0000 mg | ORAL_TABLET | Freq: Two times a day (BID) | ORAL | 0 refills | Status: DC

## 2019-02-07 NOTE — Progress Notes (Signed)
Virtual Visit via Telephone Note  I connected with Blake Mcknight on 02/07/19 at  1:00 PM EST by telephone and verified that I am speaking with the correct person using two identifiers.   I discussed the limitations, risks, security and privacy concerns of performing an evaluation and management service by telephone and the availability of in person appointments. I also discussed with the patient that there may be a patient responsible charge related to this service. The patient expressed understanding and agreed to proceed.   History of Present Illness: Patient was evaluated by phone session.  I spoke to his sister who is close by.  Patient recently seen in emergency room because of shortness of breath.  He is now taking nebulizer treatment and oxygen.  His sister endorsed that he is feeling better but sometimes he gets nervous and anxious.  He has difficulty breathing.  Overall he is sleeping 7 to 8 hours.  Patient told that he is hearing voices which is chronic in nature but denies any command auditory hallucination.  He feel paranoid that people calling his name but denies any suicidal thoughts or homicidal thought.  His sister endorsed that he is not agitated angry or any recent episode of violence.  Sometimes he talks to himself with self giggling and laughing but no major behavioral problems.  He has mild tremors but he feels the Cogentin helps those tremors.  Patient and his sister wants to keep the current medication for now until things are symptoms started to get worse.  Past Psychiatric History:Reviewed. History of schizophrenia and admitted at [facility] years ago. At that time he was very paranoid, delusional, disorganized behavior and having mood swings.    Psychiatric Specialty Exam: Physical Exam  Review of Systems  Respiratory: Positive for shortness of breath.     There were no vitals taken for this visit.There is no height or weight on file to calculate BMI.   General Appearance: NA  Eye Contact:  NA  Speech:  Slow and rambling  Volume:  Decreased  Mood:  Euthymic  Affect:  NA  Thought Process:  Descriptions of Associations: Intact  Orientation:  Full (Time, Place, and Person)  Thought Content:  Hallucinations: Auditory people calling my name  Suicidal Thoughts:  No  Homicidal Thoughts:  No  Memory:  Immediate;   Fair Recent;   Fair Remote;   Poor  Judgement:  Fair  Insight:  Fair  Psychomotor Activity:  NA  Concentration:  Concentration: Fair and Attention Span: Fair  Recall:  [employer] of Knowledge:  Fair  Language:  Fair  Akathisia:  No  Handed:  Right  AIMS (if indicated):     Assets:  Desire for Improvement Housing Resilience  ADL's:  Intact  Cognition:  Impaired,  Mild  Sleep:   7 hrs      Assessment and Plan: Schizophrenia chronic paranoid type.  Primary insomnia.  His psychotic symptoms are chronic and stable.  We have discussed reducing Risperdal in the future however at this time we will defer changing or reducing the medication.  Reassurance given about his worsening of COPD.  Patient is on nebulizer treatment at home with oxygen.  I recommend to call us back if the symptoms get worse or if they have any question.  Discussed medication side effects and benefits continue Cogentin 2 mg twice a day, trazodone 50 mg at bedtime, Remeron 30 mg at bedtime and Risperdal 4 mg twice a day.  Follow-up in  3 months.  Follow Up Instructions:    I discussed the assessment and treatment plan with the patient. The patient was provided an opportunity to ask questions and all were answered. The patient agreed with the plan and demonstrated an understanding of the instructions.   The patient was advised to call back or seek an in-person evaluation if the symptoms worsen or if the condition fails to improve as anticipated.  I provided 20 minutes of non-face-to-face time during this encounter.   Kathlee Nations, MD

## 2019-02-18 ENCOUNTER — Emergency Department (HOSPITAL_COMMUNITY): Payer: Medicare Other

## 2019-02-18 ENCOUNTER — Other Ambulatory Visit: Payer: Self-pay

## 2019-02-18 ENCOUNTER — Encounter (HOSPITAL_COMMUNITY): Payer: Self-pay | Admitting: Pharmacy Technician

## 2019-02-18 ENCOUNTER — Inpatient Hospital Stay (HOSPITAL_COMMUNITY)
Admission: EM | Admit: 2019-02-18 | Discharge: 2019-02-20 | DRG: 191 | Disposition: A | Discharge status: Home / Self Care | Payer: Medicare Other | Attending: Internal Medicine | Admitting: Internal Medicine

## 2019-02-18 DIAGNOSIS — F209 Schizophrenia, unspecified: Secondary | ICD-10-CM | POA: Diagnosis present

## 2019-02-18 DIAGNOSIS — R000 Tachycardia, unspecified: Secondary | ICD-10-CM | POA: Diagnosis not present

## 2019-02-18 DIAGNOSIS — I10 Essential (primary) hypertension: Secondary | ICD-10-CM | POA: Diagnosis not present

## 2019-02-18 DIAGNOSIS — F17210 Nicotine dependence, cigarettes, uncomplicated: Secondary | ICD-10-CM | POA: Diagnosis present

## 2019-02-18 DIAGNOSIS — F203 Undifferentiated schizophrenia: Secondary | ICD-10-CM | POA: Diagnosis not present

## 2019-02-18 DIAGNOSIS — J449 Chronic obstructive pulmonary disease, unspecified: Secondary | ICD-10-CM | POA: Diagnosis present

## 2019-02-18 DIAGNOSIS — Z20828 Contact with and (suspected) exposure to other viral communicable diseases: Secondary | ICD-10-CM | POA: Diagnosis present

## 2019-02-18 DIAGNOSIS — F17200 Nicotine dependence, unspecified, uncomplicated: Secondary | ICD-10-CM | POA: Diagnosis present

## 2019-02-18 DIAGNOSIS — Z66 Do not resuscitate: Secondary | ICD-10-CM | POA: Diagnosis present

## 2019-02-18 DIAGNOSIS — J9611 Chronic respiratory failure with hypoxia: Secondary | ICD-10-CM | POA: Diagnosis present

## 2019-02-18 DIAGNOSIS — Z79899 Other long term (current) drug therapy: Secondary | ICD-10-CM | POA: Diagnosis not present

## 2019-02-18 DIAGNOSIS — Z9981 Dependence on supplemental oxygen: Secondary | ICD-10-CM | POA: Diagnosis not present

## 2019-02-18 DIAGNOSIS — J439 Emphysema, unspecified: Secondary | ICD-10-CM | POA: Diagnosis not present

## 2019-02-18 DIAGNOSIS — E785 Hyperlipidemia, unspecified: Secondary | ICD-10-CM | POA: Diagnosis present

## 2019-02-18 DIAGNOSIS — J441 Chronic obstructive pulmonary disease with (acute) exacerbation: Secondary | ICD-10-CM | POA: Diagnosis not present

## 2019-02-18 DIAGNOSIS — R0689 Other abnormalities of breathing: Secondary | ICD-10-CM | POA: Diagnosis not present

## 2019-02-18 DIAGNOSIS — J9621 Acute and chronic respiratory failure with hypoxia: Secondary | ICD-10-CM | POA: Diagnosis not present

## 2019-02-18 DIAGNOSIS — R0602 Shortness of breath: Secondary | ICD-10-CM | POA: Diagnosis not present

## 2019-02-18 DIAGNOSIS — R0603 Acute respiratory distress: Secondary | ICD-10-CM | POA: Diagnosis not present

## 2019-02-18 LAB — CBC WITH DIFFERENTIAL/PLATELET
Abs Immature Granulocytes: 0.03 10*3/uL (ref 0.00–0.07)
Basophils Absolute: 0.1 10*3/uL (ref 0.0–0.1)
Basophils Relative: 1 %
Eosinophils Absolute: 0.4 10*3/uL (ref 0.0–0.5)
Eosinophils Relative: 4 %
HCT: 45.9 % (ref 39.0–52.0)
Hemoglobin: 15.2 g/dL (ref 13.0–17.0)
Immature Granulocytes: 0 %
Lymphocytes Relative: 20 %
Lymphs Abs: 1.7 10*3/uL (ref 0.7–4.0)
MCH: 31 pg (ref 26.0–34.0)
MCHC: 33.1 g/dL (ref 30.0–36.0)
MCV: 93.7 fL (ref 80.0–100.0)
Monocytes Absolute: 0.7 10*3/uL (ref 0.1–1.0)
Monocytes Relative: 8 %
Neutro Abs: 5.4 10*3/uL (ref 1.7–7.7)
Neutrophils Relative %: 67 %
Platelets: 247 10*3/uL (ref 150–400)
RBC: 4.9 MIL/uL (ref 4.22–5.81)
RDW: 12.9 % (ref 11.5–15.5)
WBC: 8.2 10*3/uL (ref 4.0–10.5)
nRBC: 0 % (ref 0.0–0.2)

## 2019-02-18 LAB — BASIC METABOLIC PANEL
Anion gap: 12 (ref 5–15)
BUN: <5 mg/dL — ABNORMAL LOW (ref 8–23)
CO2: 23 mmol/L (ref 22–32)
Calcium: 9.2 mg/dL (ref 8.9–10.3)
Chloride: 98 mmol/L (ref 98–111)
Creatinine, Ser: 0.71 mg/dL (ref 0.61–1.24)
GFR calc Af Amer: >60 mL/min (ref >60)
GFR calc non Af Amer: >60 mL/min (ref >60)
Glucose, Bld: 109 mg/dL — ABNORMAL HIGH (ref 70–99)
Potassium: 4.4 mmol/L (ref 3.5–5.1)
Sodium: 133 mmol/L — ABNORMAL LOW (ref 135–145)

## 2019-02-18 LAB — POC SARS CORONAVIRUS 2 AG -  ED: SARS Coronavirus 2 Ag: NEGATIVE

## 2019-02-18 LAB — SARS CORONAVIRUS 2 (TAT 6-24 HRS): SARS Coronavirus 2: NEGATIVE

## 2019-02-18 MED ORDER — CARVEDILOL 3.125 MG PO TABS
3.1250 mg | ORAL_TABLET | Freq: Two times a day (BID) | ORAL | Status: DC
  Administered 2019-02-19 – 2019-02-20 (×3): 3.125 mg via ORAL
  Filled 2019-02-18 (×4): qty 1

## 2019-02-18 MED ORDER — IPRATROPIUM-ALBUTEROL 0.5-2.5 (3) MG/3ML IN SOLN
3.0000 mL | Freq: Four times a day (QID) | RESPIRATORY_TRACT | Status: DC
  Administered 2019-02-18 – 2019-02-20 (×7): 3 mL via RESPIRATORY_TRACT
  Filled 2019-02-18 (×8): qty 3

## 2019-02-18 MED ORDER — ALBUTEROL SULFATE HFA 108 (90 BASE) MCG/ACT IN AERS
6.0000 | INHALATION_SPRAY | Freq: Once | RESPIRATORY_TRACT | Status: AC
  Administered 2019-02-18: 6 via RESPIRATORY_TRACT
  Filled 2019-02-18: qty 6.7

## 2019-02-18 MED ORDER — METHYLPREDNISOLONE SODIUM SUCC 125 MG IJ SOLR
80.0000 mg | Freq: Two times a day (BID) | INTRAMUSCULAR | Status: DC
  Administered 2019-02-19: 80 mg via INTRAVENOUS
  Filled 2019-02-18: qty 2

## 2019-02-18 MED ORDER — DOXYCYCLINE HYCLATE 100 MG PO TABS
100.0000 mg | ORAL_TABLET | Freq: Two times a day (BID) | ORAL | Status: DC
  Administered 2019-02-19 – 2019-02-20 (×4): 100 mg via ORAL
  Filled 2019-02-18 (×4): qty 1

## 2019-02-18 MED ORDER — ATORVASTATIN CALCIUM 10 MG PO TABS
10.0000 mg | ORAL_TABLET | Freq: Every day | ORAL | Status: DC
  Administered 2019-02-19: 10 mg via ORAL
  Filled 2019-02-18: qty 1

## 2019-02-18 MED ORDER — ALBUTEROL (5 MG/ML) CONTINUOUS INHALATION SOLN: 10.0000 mg/h | INHALATION_SOLUTION | Freq: Once | RESPIRATORY_TRACT | Status: DC

## 2019-02-18 MED ORDER — METHYLPREDNISOLONE SODIUM SUCC 125 MG IJ SOLR: 60.0000 mg | Freq: Two times a day (BID) | INTRAMUSCULAR | Status: DC

## 2019-02-18 MED ORDER — ACETAMINOPHEN 325 MG PO TABS
650.0000 mg | ORAL_TABLET | Freq: Once | ORAL | Status: AC
  Administered 2019-02-18: 650 mg via ORAL
  Filled 2019-02-18: qty 2

## 2019-02-18 MED ORDER — RISPERIDONE 2 MG PO TABS
4.0000 mg | ORAL_TABLET | Freq: Two times a day (BID) | ORAL | Status: DC
  Administered 2019-02-19 – 2019-02-20 (×4): 4 mg via ORAL
  Filled 2019-02-18 (×5): qty 2

## 2019-02-18 MED ORDER — BENZTROPINE MESYLATE 2 MG PO TABS
2.0000 mg | ORAL_TABLET | Freq: Two times a day (BID) | ORAL | Status: DC
  Administered 2019-02-19 – 2019-02-20 (×4): 2 mg via ORAL
  Filled 2019-02-18 (×4): qty 1
  Filled 2019-02-18: qty 2

## 2019-02-18 MED ORDER — PREDNISONE 20 MG PO TABS: 40.0000 mg | ORAL_TABLET | Freq: Every day | ORAL | Status: DC

## 2019-02-18 MED ORDER — ALBUTEROL (5 MG/ML) CONTINUOUS INHALATION SOLN
10.0000 mg/h | INHALATION_SOLUTION | RESPIRATORY_TRACT | Status: DC
  Administered 2019-02-18: 10 mg/h via RESPIRATORY_TRACT

## 2019-02-18 MED ORDER — ENOXAPARIN SODIUM 40 MG/0.4ML ~~LOC~~ SOLN
40.0000 mg | SUBCUTANEOUS | Status: DC
  Administered 2019-02-19 (×2): 40 mg via SUBCUTANEOUS
  Filled 2019-02-18 (×2): qty 0.4

## 2019-02-18 MED ORDER — NICOTINE 14 MG/24HR TD PT24
14.0000 mg | MEDICATED_PATCH | Freq: Every day | TRANSDERMAL | Status: DC
  Administered 2019-02-19 – 2019-02-20 (×2): 14 mg via TRANSDERMAL
  Filled 2019-02-18 (×2): qty 1

## 2019-02-18 MED ORDER — TRAZODONE HCL 50 MG PO TABS: 50.0000 mg | ORAL_TABLET | Freq: Every evening | ORAL | Status: DC | PRN

## 2019-02-18 MED ORDER — IPRATROPIUM BROMIDE 0.02 % IN SOLN
0.5000 mg | Freq: Once | RESPIRATORY_TRACT | Status: AC
  Administered 2019-02-18: 0.5 mg via RESPIRATORY_TRACT
  Filled 2019-02-18: qty 2.5

## 2019-02-18 MED ORDER — ALBUTEROL SULFATE (2.5 MG/3ML) 0.083% IN NEBU: 10.0000 mg | INHALATION_SOLUTION | Freq: Once | RESPIRATORY_TRACT | Status: DC

## 2019-02-18 MED ORDER — METHYLPREDNISOLONE SODIUM SUCC 125 MG IJ SOLR: 80.0000 mg | Freq: Two times a day (BID) | INTRAMUSCULAR | Status: DC

## 2019-02-18 MED ORDER — ALBUTEROL (5 MG/ML) CONTINUOUS INHALATION SOLN
INHALATION_SOLUTION | RESPIRATORY_TRACT | Status: AC
  Filled 2019-02-18: qty 20

## 2019-02-18 MED ORDER — ALBUTEROL SULFATE (2.5 MG/3ML) 0.083% IN NEBU
2.5000 mg | INHALATION_SOLUTION | RESPIRATORY_TRACT | Status: DC | PRN
  Administered 2019-02-18 – 2019-02-20 (×5): 2.5 mg via RESPIRATORY_TRACT
  Filled 2019-02-18 (×5): qty 3

## 2019-02-18 MED ORDER — MOMETASONE FURO-FORMOTEROL FUM 200-5 MCG/ACT IN AERO
2.0000 | INHALATION_SPRAY | Freq: Two times a day (BID) | RESPIRATORY_TRACT | Status: DC
  Administered 2019-02-20: 2 via RESPIRATORY_TRACT
  Filled 2019-02-18 (×2): qty 8.8

## 2019-02-18 MED ORDER — ALBUTEROL SULFATE HFA 108 (90 BASE) MCG/ACT IN AERS
1.0000 | INHALATION_SPRAY | Freq: Four times a day (QID) | RESPIRATORY_TRACT | Status: DC | PRN
  Administered 2019-02-18: 2 via RESPIRATORY_TRACT

## 2019-02-18 MED ORDER — ENALAPRIL MALEATE 5 MG PO TABS
10.0000 mg | ORAL_TABLET | Freq: Every day | ORAL | Status: DC
  Administered 2019-02-19 – 2019-02-20 (×2): 10 mg via ORAL
  Filled 2019-02-18 (×2): qty 2

## 2019-02-18 MED ORDER — MIRTAZAPINE 15 MG PO TABS
30.0000 mg | ORAL_TABLET | Freq: Every day | ORAL | Status: DC
  Administered 2019-02-19 (×2): 30 mg via ORAL
  Filled 2019-02-18: qty 1
  Filled 2019-02-18 (×2): qty 2

## 2019-02-18 NOTE — ED Notes (Signed)
[name] (Sister/POC#[phone])

## 2019-02-18 NOTE — ED Notes (Signed)
Dinner Tray Ordered @ [phone].

## 2019-02-18 NOTE — H&P (Addendum)
History and Physical    Blake Mcknight [id number] DOB: [birth year] DOA: 02/18/2019  PCP: [name], MD Consultants:  Adele Schilder - psychiatry Patient coming from:  Home - lives with sister and grandson; NOK: Felipa Evener, [place]  Chief Complaint: Respiratory distress  HPI: Blake Mcknight is a 63 y.o. male with medical history significant of schizophrenia; HTN; HLD; and COPD on2L home O2 continuing to smoke presenting with respiratory distress. He was doing pretty well with bad breathing spells intermittently over the last 6-8 months.  They went to [facility] in November and August for these spells.  He started having these spells but it got worse today about 1215.  He got very SOB.  He has had cough productive of lots of phlegm.  No sick contacts, low concern for COVID; "pretty good" about staying home and wearing a mask.  Temp 98.5 the other day and she gave him 1 Ibuprofen.  He wheezes off and on.  He is still smoking.   ED Course:  Severe COPD on 2L home O2.  Saw PCP recently, negative COVID. Worsening breathing, poor aeration, mild distress.  Improved with breath treatments, not currently on BIPAP.  Given doxy, Mag, solumedrol.  Initial COVID negative.  Review of Systems: As per HPI; otherwise review of systems reviewed and negative.   Ambulatory Status:  Ambulates without assistance  Past Medical History:  Diagnosis Date  . COPD (chronic obstructive pulmonary disease) (HCC)    with bullous emphysema.   Marland Kitchen Heavy cigarette smoker before 2003   pt claims only 10 cigs per day, never heavier amounts.   Marland Kitchen HLD (hyperlipidemia)   . Hypertension   . Schizophrenia ([place]) 06/28/2013   This is a chronic condition and he lives with family    Past Surgical History:  Procedure Laterality Date  . CHOLECYSTECTOMY N/A 06/06/2013   Procedure: LAPAROSCOPIC CHOLECYSTECTOMY WITH ATTEMPTED INTRAOPERATIVE CHOLANGIOGRAM;  Surgeon: Zenovia Jarred, MD;  Location: [place];  Service: General;   Laterality: N/A;  . ERCP N/A 06/03/2013   Procedure: ENDOSCOPIC RETROGRADE CHOLANGIOPANCREATOGRAPHY (ERCP);  Surgeon: Ladene Artist, MD;  Location: [facility] ENDOSCOPY;  Service: Endoscopy;  Laterality: N/A;  . NO PAST SURGERIES      Social History   Socioeconomic History  . Marital status: Single    Spouse name: Not on file  . Number of children: Not on file  . Years of education: Not on file  . Highest education level: Not on file  Occupational History  . Occupation: disabled  Social Needs  . Financial resource strain: Not on file  . Food insecurity    Worry: Not on file    Inability: Not on file  . Transportation needs    Medical: Not on file    Non-medical: Not on file  Tobacco Use  . Smoking status: Current Every Day Smoker    Packs/day: 1.00    Years: 41.00    Pack years: 41.00    Types: Cigarettes  . Smokeless tobacco: Never Used  . Tobacco comment: Gets 4 puffs a day.   Substance and Sexual Activity  . Alcohol use: No  . Drug use: No  . Sexual activity: Yes  Lifestyle  . Physical activity    Days per week: Not on file    Minutes per session: Not on file  . Stress: Not on file  Relationships  . Social Herbalist on phone: Not on file    Gets together: Not on file  Attends religious service: Not on file    Active member of club or organization: Not on file    Attends meetings of clubs or organizations: Not on file    Relationship status: Not on file  . Intimate partner violence    Fear of current or ex partner: Not on file    Emotionally abused: Not on file    Physically abused: Not on file    Forced sexual activity: Not on file  Other Topics Concern  . Not on file  Social History Narrative  . Not on file    No Known Allergies  Family History  Problem Relation Age of Onset  . Diabetes Mellitus II Mother   . Diabetes Mother   . Heart disease Father   . Stroke Maternal Aunt   . CAD Neg Hx     Prior to Admission medications   Medication  Sig Start Date End Date Taking? Authorizing Provider  acetaminophen (TYLENOL) 325 MG tablet Take 2 tablets (650 mg total) by mouth every 6 (six) hours as needed for mild pain, moderate pain, fever or headache (or Fever >/= 101). 06/18/16   Hongalgi, Lenis Dickinson, MD  ADVAIR [facility] 115-21 MCG/ACT inhaler Inhale 1 puff into the lungs 2 (two) times daily. 09/21/15   [provider]  albuterol (PROVENTIL HFA;VENTOLIN HFA) 108 (90 Base) MCG/ACT inhaler Inhale 1-2 puffs into the lungs every 6 (six) hours as needed for wheezing or shortness of breath. Patient taking differently: Inhale 2 puffs into the lungs every 6 (six) hours.  07/07/17   Wieters, Hallie C, PA-C  albuterol (PROVENTIL) (2.5 MG/3ML) 0.083% nebulizer solution Take 3 mLs (2.5 mg total) by nebulization every 6 (six) hours as needed for wheezing or shortness of breath. May use every 4-6 hours for shortness of breath. 07/16/17   Antonietta Breach, PA-C  Ascorbic Acid (VITAMIN C PO) Take 1 tablet by mouth daily.    [provider]  atorvastatin (LIPITOR) 10 MG tablet Take 10 mg by mouth daily at 6 PM.  09/21/15   [provider]  azithromycin (ZITHROMAX) 250 MG tablet Take 1 tablet (250 mg total) by mouth daily. Take first 2 tablets together, then 1 every day until finished. 01/29/19   Loura Halt A, NP  benztropine (COGENTIN) 2 MG tablet Take 1 tablet (2 mg total) by mouth 2 (two) times daily. 02/07/19   Arfeen, Arlyce Harman, MD  carvedilol (COREG) 3.125 MG tablet Take 3.125 mg by mouth every 12 (twelve) hours. ONLY IF HR IS GREATER THAN 98-110 07/27/16   [provider]  Cholecalciferol (VITAMIN D PO) Take 1 tablet by mouth every other day.     [provider]  Cyanocobalamin (B-12 PO) Take 1 tablet by mouth every other day.     [provider]  enalapril (VASOTEC) 10 MG tablet Take 10 mg by mouth daily. 06/04/16   [provider]  mirtazapine (REMERON) 30 MG tablet Take 1 tablet (30 mg total) by mouth at bedtime.  02/07/19 02/07/20  Arfeen, Arlyce Harman, MD  multivitamin (ONE-A-DAY MEN'S) TABS tablet Take 1 tablet by mouth daily.    [provider]  OXYGEN Inhale 2 L into the lungs See admin instructions. Inhale 2 liters via cannula upon exertion    [provider]  risperidone (RISPERDAL) 4 MG tablet Take 1 tablet (4 mg total) by mouth 2 (two) times daily. 02/07/19   Arfeen, Arlyce Harman, MD  traZODone (DESYREL) 50 MG tablet Take 1 tablet (50 mg  total) by mouth at bedtime as needed for sleep. 02/07/19   Kathlee Nations, MD    Physical Exam: Vitals:   02/18/19 1541 02/18/19 1600 02/18/19 1630 02/18/19 1715  BP:  126/88 138/87 137/81  Pulse:  (!) 101 (!) 102 100  Resp:  (!) 23 (!) 21 17  Temp:      TempSrc:      SpO2: 100% 100% 100% 99%     . General:  Appears calm and comfortable and is NAD . Eyes:  PERRL, EOMI, normal lids, iris . ENT:  grossly normal hearing, lips & tongue, mmm; appropriate dentition . Neck:  no LAD, masses or thyromegaly; no carotid bruits . Cardiovascular:  RR with mild tachycardia, no m/r/g.  . Respiratory:   Marked, persistent expiratory wheezes with diminished air movement throughout.  Mildly increased respiratory effort. . Abdomen:  soft, NT, ND, NABS . Skin:  no rash or induration seen on limited exam . Musculoskeletal:  grossly normal tone BUE/BLE, good ROM, no bony abnormality . Psychiatric:  blunted mood and affect, speech sparse but appropriate, AOx3 . Neurologic:  CN 2-12 grossly intact, moves all extremities in coordinated fashion, sensation intact    Radiological Exams on Admission: Dg Chest Port 1 View  Result Date: 02/18/2019 CLINICAL DATA:  Respiratory distress. EXAM: PORTABLE CHEST 1 VIEW COMPARISON:  PA and lateral chest 01/29/2019. FINDINGS: The lungs are clear. The patient has severe emphysema. No pneumothorax or pleural effusion. Heart size is normal. No acute or focal bony abnormality. IMPRESSION: No acute disease. Severe emphysema.  Electronically Signed   By: Inge Rise M.D.   On: 02/18/2019 15:12    EKG: Independently reviewed.  Sinus tachycardia with rate 102; nonspecific ST changes with no evidence of acute ischemia; NSCSLT   Labs on Admission: I have personally reviewed the available labs and imaging studies at the time of the admission.  Pertinent labs:   Glucose 109 Normal CBC BD COVID negative; Hologic COVID pending  Assessment/Plan Principal Problem:   Acute on chronic respiratory failure with hypoxia (HCC) Active Problems:   HTN (hypertension)   HLD (hyperlipidemia)   Chronic obstructive pulmonary disease with acute exacerbation (HCC)   Tobacco use disorder   Schizophrenia ([place])   Do not resuscitate   Acute on chronic respiratory failure associated with a COPD exacerbation -Patient's shortness of breath and productive cough are most likely caused by acute COPD exacerbation.  -He has history of O2-dependent COPD and continues to smoke -He does not have fever or leukocytosis.  -Chest x-ray is not consistent with pneumonia -He was given a continuous neb treatment in the ED with some improvement.   -will admit patient - with his poor air movement and tachypnea, it seems likely that he will need several days of hospitalization to show sufficient improvement for discharge. -Nebulizers: scheduled Duoneb and prn albuterol -Continue home Advair ([place] substitution) -Solu-Medrol 80 mg IV BID  -Empiric doxycycline in case of bacterial component -COVID testing (Hologic) is pending; if negative it is reasonable to d/c precautions.  HTN -Continue home meds - Coreg, Enalapril  HLD -Continue Lipitor  Schizophrenia -Continue home meds - Remeron, Risperdal, Trazodone, Cogentin  Tobacco dependence -Encourage cessation.   -This was discussed with the patient and his sister and should be reviewed on an ongoing basis.   -Patch ordered at patient request.  DNR -Patient clearly stated that  he preferred to be DNR in the presence of his sister, who was supportive of his decision Note: This  patient has been tested and is negative for the novel coronavirus COVID-19.    DVT prophylaxis: Lovenox  Code Status:  DNR - confirmed with patient/sister Family Communication: Sister was present throughout evaluation Disposition Plan:  Home once clinically improved Consults called: CM/SW/PT/OT/Nutrition/RT  Admission status: Admit - It is my clinical opinion that admission to INPATIENT is reasonable and necessary because this patient will require at least 2 midnights in the hospital to treat this condition based on the medical complexity of the problems presented.  Given the aforementioned information, the predictability of an adverse outcome is felt to be significant.    Karmen Bongo MD [facility]   How to contact the [facility] Attending or Consulting provider [place] or covering provider during after hours [place], for this patient?  1. Check the care team in [facility] and look for a) attending/consulting TRH provider listed and b) the [facility] team listed 2. Log into [email or web address] and use [place]'s universal password to access. If you do not have the password, please contact the hospital operator. 3. Locate the [facility] provider you are looking for under [facility] and page to a number that you can be directly reached. 4. If you still have difficulty reaching the provider, please page the [facility] (Director on Call) for the Hospitalists listed on amion for assistance.   02/18/2019, 6:22 PM

## 2019-02-18 NOTE — ED Provider Notes (Addendum)
[facility] EMERGENCY DEPARTMENT Provider Note   CSN: [id number] Arrival date & time: 02/18/19  1346     History   Chief Complaint Chief Complaint  Patient presents with  . Respiratory Distress    HPI Blake Mcknight is a 63 y.o. male.     HPI 63 year old male with advanced COPD history on 2 L of oxygen, hypertension, hyperlipidemia comes in a chief complaint of respiratory distress. Patient reports that gradually his breathing has gotten worse over the past few days, however at 2 AM it got severely worse.  Has been having difficulty breathing since then despite taking 3 breathing treatments today.  Patient unsure why he is having exacerbation of his symptoms.  He denies any fevers, chills, sick contacts.  He has a cough but it is dry and not new.  Past Medical History:  Diagnosis Date  . COPD (chronic obstructive pulmonary disease) (HCC)    with bullous emphysema.   Marland Kitchen Heavy cigarette smoker before 2003   pt claims only 10 cigs per day, never heavier amounts.   Marland Kitchen HLD (hyperlipidemia)   . Hypertension   . Schizophrenia ([place]) 06/28/2013   This is a chronic condition and he lives with family    Patient Active Problem List   Diagnosis Date Noted  . Acute respiratory failure with hypercapnia ([place]) 06/16/2016  . Acute respiratory failure with hypoxia ([place]) 06/16/2016  . Acute on chronic respiratory failure with hypoxia ([place]) 05/25/2016  . Altered mental status 09/22/2015  . Hyponatremia 09/22/2015  . Elevated lactic acid level 09/22/2015  . Acute encephalopathy   . Bullous emphysema ([place]) 07/26/2013  . Schizophrenia ([place]) 06/28/2013  . Tobacco use disorder 06/06/2013  . Abdominal pain 06/03/2013  . Sepsis ([place]) 06/03/2013  . Elevated LFTs 06/03/2013  . HTN (hypertension) 06/03/2013  . HLD (hyperlipidemia) 06/03/2013  . Chronic obstructive pulmonary disease with acute exacerbation ([place]) 06/03/2013  . Calculus of bile duct without mention of  cholecystitis or obstruction 06/03/2013    Past Surgical History:  Procedure Laterality Date  . CHOLECYSTECTOMY N/A 06/06/2013   Procedure: LAPAROSCOPIC CHOLECYSTECTOMY WITH ATTEMPTED INTRAOPERATIVE CHOLANGIOGRAM;  Surgeon: Zenovia Jarred, MD;  Location: [place];  Service: General;  Laterality: N/A;  . ERCP N/A 06/03/2013   Procedure: ENDOSCOPIC RETROGRADE CHOLANGIOPANCREATOGRAPHY (ERCP);  Surgeon: Ladene Artist, MD;  Location: [facility] ENDOSCOPY;  Service: Endoscopy;  Laterality: N/A;  . NO PAST SURGERIES          Home Medications    Prior to Admission medications   Medication Sig Start Date End Date Taking? Authorizing Provider  acetaminophen (TYLENOL) 325 MG tablet Take 2 tablets (650 mg total) by mouth every 6 (six) hours as needed for mild pain, moderate pain, fever or headache (or Fever >/= 101). 06/18/16   Hongalgi, Lenis Dickinson, MD  ADVAIR [facility] 115-21 MCG/ACT inhaler Inhale 1 puff into the lungs 2 (two) times daily. 09/21/15   [provider]  albuterol (PROVENTIL HFA;VENTOLIN HFA) 108 (90 Base) MCG/ACT inhaler Inhale 1-2 puffs into the lungs every 6 (six) hours as needed for wheezing or shortness of breath. Patient taking differently: Inhale 2 puffs into the lungs every 6 (six) hours.  07/07/17   Wieters, Hallie C, PA-C  albuterol (PROVENTIL) (2.5 MG/3ML) 0.083% nebulizer solution Take 3 mLs (2.5 mg total) by nebulization every 6 (six) hours as needed for wheezing or shortness of breath. May use every 4-6 hours for shortness of breath. 07/16/17   Antonietta Breach, PA-C  Ascorbic Acid (VITAMIN  C PO) Take 1 tablet by mouth daily.    [provider]  atorvastatin (LIPITOR) 10 MG tablet Take 10 mg by mouth daily at 6 PM.  09/21/15   [provider]  azithromycin (ZITHROMAX) 250 MG tablet Take 1 tablet (250 mg total) by mouth daily. Take first 2 tablets together, then 1 every day until finished. 01/29/19   Loura Halt A, NP  benztropine (COGENTIN) 2 MG tablet Take 1 tablet (2 mg  total) by mouth 2 (two) times daily. 02/07/19   Arfeen, Arlyce Harman, MD  carvedilol (COREG) 3.125 MG tablet Take 3.125 mg by mouth every 12 (twelve) hours. ONLY IF HR IS GREATER THAN 98-110 07/27/16   [provider]  Cholecalciferol (VITAMIN D PO) Take 1 tablet by mouth every other day.     [provider]  Cyanocobalamin (B-12 PO) Take 1 tablet by mouth every other day.     [provider]  enalapril (VASOTEC) 10 MG tablet Take 10 mg by mouth daily. 06/04/16   [provider]  mirtazapine (REMERON) 30 MG tablet Take 1 tablet (30 mg total) by mouth at bedtime. 02/07/19 02/07/20  Arfeen, Arlyce Harman, MD  multivitamin (ONE-A-DAY MEN'S) TABS tablet Take 1 tablet by mouth daily.    [provider]  OXYGEN Inhale 2 L into the lungs See admin instructions. Inhale 2 liters via cannula upon exertion    [provider]  risperidone (RISPERDAL) 4 MG tablet Take 1 tablet (4 mg total) by mouth 2 (two) times daily. 02/07/19   Arfeen, Arlyce Harman, MD  traZODone (DESYREL) 50 MG tablet Take 1 tablet (50 mg total) by mouth at bedtime as needed for sleep. 02/07/19   Arfeen, Arlyce Harman, MD    Family History Family History  Problem Relation Age of Onset  . Diabetes Mellitus II Mother   . Diabetes Mother   . Heart disease Father   . Stroke Maternal Aunt   . CAD Neg Hx     Social History Social History   Tobacco Use  . Smoking status: Current Every Day Smoker    Packs/day: 1.00    Years: 10.00    Pack years: 10.00    Types: Cigarettes  . Smokeless tobacco: Never Used  . Tobacco comment: Gets 4 puffs a day.   Substance Use Topics  . Alcohol use: No  . Drug use: No     Allergies   Patient has no known allergies.   Review of Systems Review of Systems  Constitutional: Positive for activity change.  Respiratory: Positive for cough, chest tightness and shortness of breath.   Gastrointestinal: Negative for nausea and vomiting.  Allergic/Immunologic: Negative for  immunocompromised state.     Physical Exam Updated Vital Signs BP 133/90   Pulse 99   Temp 97.6 F (36.4 C) (Oral)   Resp (!) 24   SpO2 100%   Physical Exam Vitals signs and nursing note reviewed.  Constitutional:      Appearance: He is well-developed.  HENT:     Head: Atraumatic.  Neck:     Musculoskeletal: Neck supple.  Cardiovascular:     Rate and Rhythm: Normal rate.  Pulmonary:     Effort: Respiratory distress present.     Breath sounds: No wheezing.     Comments: Poor aeration diffusely Musculoskeletal:     Right lower leg: No edema.     Left lower leg: No edema.  Skin:    General: Skin is warm.  Neurological:  Mental Status: He is alert and oriented to person, place, and time.      ED Treatments / Results  Labs (all labs ordered are listed, but only abnormal results are displayed) Labs Reviewed  BASIC METABOLIC PANEL - Abnormal; Notable for the following components:      Result Value   Sodium 133 (*)    Glucose, Bld 109 (*)    BUN <5 (*)    All other components within normal limits  SARS CORONAVIRUS 2 (TAT 6-24 HRS)  CBC WITH DIFFERENTIAL/PLATELET  POC SARS CORONAVIRUS 2 AG -  ED    EKG EKG Interpretation  Date/Time:  Friday February 18 2019 13:58:38 EST Ventricular Rate:  102 PR Interval:    QRS Duration: 86 QT Interval:  321 QTC Calculation: 419 R Axis:   99 Text Interpretation: Sinus tachycardia Right axis deviation Probable anteroseptal infarct, old No acute changes No significant change since last tracing Confirmed by Varney Biles [phone]) on 02/18/2019 2:00:12 PM   Radiology Dg Chest Port 1 View  Result Date: 02/18/2019 CLINICAL DATA:  Respiratory distress. EXAM: PORTABLE CHEST 1 VIEW COMPARISON:  PA and lateral chest 01/29/2019. FINDINGS: The lungs are clear. The patient has severe emphysema. No pneumothorax or pleural effusion. Heart size is normal. No acute or focal bony abnormality. IMPRESSION: No acute disease. Severe  emphysema. Electronically Signed   By: Inge Rise M.D.   On: 02/18/2019 15:12    Procedures .Critical Care Performed by: Varney Biles, MD Authorized by: Varney Biles, MD   Critical care provider statement:    Critical care time (minutes):  36   Critical care was necessary to treat or prevent imminent or life-threatening deterioration of the following conditions:  Respiratory failure   Critical care was time spent personally by me on the following activities:  Discussions with consultants, evaluation of patient's response to treatment, examination of patient, ordering and performing treatments and interventions, ordering and review of laboratory studies, ordering and review of radiographic studies, pulse oximetry, re-evaluation of patient's condition, obtaining history from patient or surrogate and review of old charts   (including critical care time)  Medications Ordered in ED Medications  ipratropium (ATROVENT) nebulizer solution 0.5 mg (has no administration in time range)  albuterol (PROVENTIL) (2.5 MG/3ML) 0.083% nebulizer solution 10 mg (has no administration in time range)  albuterol (VENTOLIN HFA) 108 (90 Base) MCG/ACT inhaler 6 puff (6 puffs Inhalation Given 02/18/19 1458)  acetaminophen (TYLENOL) tablet 650 mg (650 mg Oral Given 02/18/19 1524)     Initial Impression / Assessment and Plan / ED Course  I have reviewed the triage vital signs and the nursing notes.  Pertinent labs & imaging results that were available during my care of the patient were reviewed by me and considered in my medical decision making (see chart for details).  Clinical Course as of Feb 17 1525  Fri Feb 18, 2019  1525 Patient reassessed. He is appearing more comfortable and there is increased wheezing.  Respiratory rate is in the low 20s.  No respiratory distress at this time. Aeration has improved but he still tight.  We will have to admit him for acute COPD exacerbation.   [AN]  1525 Chest  x-ray shows severe emphysema without any infiltrate.  Point-of-care Covid test is negative.  DG Chest Port 1 View [AN]    Clinical Course User Index [AN] Varney Biles, MD       63 year old male with history of advanced COPD comes in a chief complaint of  shortness of breath.  He has been having worsening respiratory distress since 2 AM.  EMS has given him Solu-Medrol, mag and patient is on 15 L of oxygen.  He reports that he had negative Covid test recently. He denies any sick contacts.  Differential diagnoses include COPD exacerbation because of underlying pneumonia.  Pneumonia could be bacterial or viral.  Additionally he could have COPD exacerbation because of weather change.  He is in respiratory distress and is hardly moving any air.  Rapid Covid has been ordered and we will place him on BiPAP if it is negative.  We anticipate admission.  Final Clinical Impressions(s) / ED Diagnoses   Final diagnoses:  COPD exacerbation [facility])    ED Discharge Orders    None         Varney Biles, MD 02/18/19 1526

## 2019-02-18 NOTE — ED Triage Notes (Signed)
Pt bib ems from home with reports of resp distress X1 hour. Hx copd and asthma. 5mg  albuterol given by FD. 125mg  solumedrol and 2gm Magnesium. 100% on 15L NRB. Recent negative covid. Hx of CPAP. 18g LAC.

## 2019-02-19 DIAGNOSIS — I10 Essential (primary) hypertension: Secondary | ICD-10-CM

## 2019-02-19 DIAGNOSIS — F203 Undifferentiated schizophrenia: Secondary | ICD-10-CM | POA: Diagnosis not present

## 2019-02-19 DIAGNOSIS — F17200 Nicotine dependence, unspecified, uncomplicated: Secondary | ICD-10-CM | POA: Diagnosis not present

## 2019-02-19 DIAGNOSIS — J441 Chronic obstructive pulmonary disease with (acute) exacerbation: Secondary | ICD-10-CM | POA: Diagnosis not present

## 2019-02-19 DIAGNOSIS — J9611 Chronic respiratory failure with hypoxia: Secondary | ICD-10-CM

## 2019-02-19 LAB — CBC
HCT: 45.9 % (ref 39.0–52.0)
Hemoglobin: 15.5 g/dL (ref 13.0–17.0)
MCH: 30.3 pg (ref 26.0–34.0)
MCHC: 33.8 g/dL (ref 30.0–36.0)
MCV: 89.6 fL (ref 80.0–100.0)
Platelets: 243 10*3/uL (ref 150–400)
RBC: 5.12 MIL/uL (ref 4.22–5.81)
RDW: 12.6 % (ref 11.5–15.5)
WBC: 6.3 10*3/uL (ref 4.0–10.5)
nRBC: 0 % (ref 0.0–0.2)

## 2019-02-19 LAB — BASIC METABOLIC PANEL
Anion gap: 12 (ref 5–15)
BUN: <5 mg/dL — ABNORMAL LOW (ref 8–23)
CO2: 23 mmol/L (ref 22–32)
Calcium: 9.5 mg/dL (ref 8.9–10.3)
Chloride: 98 mmol/L (ref 98–111)
Creatinine, Ser: 0.73 mg/dL (ref 0.61–1.24)
GFR calc Af Amer: >60 mL/min (ref >60)
GFR calc non Af Amer: >60 mL/min (ref >60)
Glucose, Bld: 125 mg/dL — ABNORMAL HIGH (ref 70–99)
Potassium: 4.7 mmol/L (ref 3.5–5.1)
Sodium: 133 mmol/L — ABNORMAL LOW (ref 135–145)

## 2019-02-19 LAB — HIV ANTIBODY (ROUTINE TESTING W REFLEX): HIV Screen 4th Generation wRfx: NONREACTIVE

## 2019-02-19 MED ORDER — ENSURE ENLIVE PO LIQD
237.0000 mL | Freq: Two times a day (BID) | ORAL | Status: DC
  Administered 2019-02-19 – 2019-02-20 (×3): 237 mL via ORAL

## 2019-02-19 MED ORDER — ADULT MULTIVITAMIN W/MINERALS CH
1.0000 | ORAL_TABLET | Freq: Every day | ORAL | Status: DC
  Administered 2019-02-19 – 2019-02-20 (×2): 1 via ORAL
  Filled 2019-02-19 (×2): qty 1

## 2019-02-19 MED ORDER — METHYLPREDNISOLONE SODIUM SUCC 40 MG IJ SOLR
40.0000 mg | Freq: Three times a day (TID) | INTRAMUSCULAR | Status: DC
  Administered 2019-02-19 – 2019-02-20 (×4): 40 mg via INTRAVENOUS
  Filled 2019-02-19 (×4): qty 1

## 2019-02-19 NOTE — Progress Notes (Signed)
Cardiac monitoring D/ced per MD order.

## 2019-02-19 NOTE — Plan of Care (Signed)
  Problem: Health Behavior/Discharge Planning: Goal: Ability to manage health-related needs will improve Outcome: Progressing   Problem: Clinical Measurements: Goal: Ability to maintain clinical measurements within normal limits will improve Outcome: Progressing Goal: Will remain free from infection Outcome: Progressing Goal: Diagnostic test results will improve Outcome: Progressing Goal: Respiratory complications will improve Outcome: Progressing Goal: Cardiovascular complication will be avoided Outcome: Progressing   Problem: Pain Managment: Goal: General experience of comfort will improve Outcome: Progressing   Problem: Education: Goal: Knowledge of disease or condition will improve Outcome: Progressing Goal: Knowledge of the prescribed therapeutic regimen will improve Outcome: Progressing Goal: Individualized Educational Video(s) Outcome: Progressing   Problem: Respiratory: Goal: Ability to maintain a clear airway will improve Outcome: Progressing Goal: Levels of oxygenation will improve Outcome: Progressing Goal: Ability to maintain adequate ventilation will improve Outcome: Progressing

## 2019-02-19 NOTE — Progress Notes (Signed)
Pt arrived to the floor with the ED RN and Nurse Tech via the bed. Pt alert and oriented on 2l O2. CHG bath given, bed weight obtained, heart monitor applied and CCMD notified. Patient oriented to room, equipment and staff. Call bell within reach, we'll continue to monitor.

## 2019-02-19 NOTE — ED Notes (Signed)
Pt moved to hospital bed for comfort.  During the move he became very SOB.  After about 5 minutes his breathing returned to normal.  Visitor given recliner to rest comfortably.

## 2019-02-19 NOTE — Progress Notes (Signed)
Initial Nutrition Assessment  DOCUMENTATION CODES:   Not applicable  INTERVENTION:    Ensure Enlive po BID, each supplement provides 350 kcal and 20 grams of protein  MVI daily   NUTRITION DIAGNOSIS:   Increased nutrient needs related to acute illness as evidenced by estimated needs.  GOAL:   Patient will meet greater than or equal to 90% of their needs  MONITOR:   PO intake, Supplement acceptance, Labs, I & O's, Weight trends  REASON FOR ASSESSMENT:   Consult COPD Protocol  ASSESSMENT:   Patient with PMH significant for schizophrenia, HTN, HLD, and COPD. Presents this admission with COPD exacerbation.   RD working remotely.  Unable to reach pt via phone. No meal completions documented. RD to provide supplementation to maximize kcal and protein this admission. Will attempt to obtain nutrition/weight history if possible.   Records indicate pt has maintained weight of 142 lb since March.   Medications: solumedrol, remeron Labs: Na 133 (L)  Diet Order:   Diet Order            Diet Heart Room service appropriate? Yes; Fluid consistency: Thin  Diet effective now              EDUCATION NEEDS:   Not appropriate for education at this time  Skin:  Skin Assessment: Reviewed RN Assessment  Last BM:  PTA  Height:   Ht Readings from Last 1 Encounters:  02/19/19 5\' 8"  (1.727 m)    Weight:   Wt Readings from Last 1 Encounters:  02/19/19 64.8 kg    Ideal Body Weight:  70 kg  BMI:  Body mass index is 21.72 kg/m.  Estimated Nutritional Needs:   Kcal:  1950-2150 kcal  Protein:  95-115 grams  Fluid:  >/= 1.9 L/day   Mariana Single RD, LDN Clinical Nutrition Pager # - [phone]

## 2019-02-19 NOTE — Plan of Care (Signed)
POC initiated and progressing. 

## 2019-02-19 NOTE — Progress Notes (Signed)
Patient ID: Blake Mcknight, male   DOB: [birth year], 63 y.o.   MRN: [id number]  PROGRESS NOTE    Blake Mcknight  [id number] DOB: October 01, 1955 DOA: 02/18/2019 PCP: [name], MD   Brief Narrative:  63 year old male with history of schizophrenia, hypertension, hyperlipidemia, COPD on 2 L oxygen at home by nasal cannula who continues to smoke presented with worsening shortness of breath.  COVID-19 test was negative.  He was admitted for COPD exacerbation and started on intravenous Solu-Medrol.  Assessment & Plan:   COPD exacerbation Chronic hypoxic respiratory failure Chronic tobacco abuse ongoing -Patient wears 2 L oxygen at home via nasal cannula.  Currently on the same amount of oxygen. -COVID-19 testing negative -Currently on Solu-Medrol 80 mg IV every 12 hours.  Will decrease to 40 mg IV every 8 hours.  Continue nebs and Dulera. -Counseled regarding tobacco cessation  Hypertension -Monitor blood pressure.  Continue Coreg and enalapril  Hyperlipidemia -Continue Lipitor  Schizophrenia -Continue Remeron, Risperdal, trazodone and Cogentin.  Outpatient follow-up with psychiatry    DVT prophylaxis: Lovenox Code Status: DNR  family Communication: Spoke to patient at bedside Disposition Plan: Home in 1 to 2 days if clinically improved  Consultants: None  Procedures: None  Antimicrobials: Doxycycline from 02/18/2019   Subjective: Patient seen and examined at bedside.  He is a poor historian.  Feels slightly better.  Does not feel ready to go home yet.  Still has intermittent cough with shortness of breath.  No overnight fever or vomiting.  Objective: Vitals:   02/19/19 0515 02/19/19 0549 02/19/19 0600 02/19/19 0805  BP:   (!) 151/92   Pulse: (!) 110   (!) 102  Resp: (!) 26  19 19   Temp:  97.6 F (36.4 C) 97.6 F (36.4 C) 97.6 F (36.4 C)  TempSrc:  Oral Oral Oral  SpO2: 96%   98%  Weight:  64.8 kg    Height:  5\' 8"  (1.727 m)      Intake/Output  Summary (Last 24 hours) at 02/19/2019 1030 Last data filed at 02/19/2019 0529 Gross per 24 hour  Intake -  Output 1000 ml  Net -1000 ml   Filed Weights   02/19/19 0549  Weight: 64.8 kg    Examination:  General exam: Appears calm and comfortable.  Poor historian. Respiratory system: Bilateral decreased breath sounds at bases with mild diffuse scattered wheezing. Cardiovascular system: S1 & S2 heard, Rate controlled Gastrointestinal system: Abdomen is nondistended, soft and nontender. Normal bowel sounds heard. Extremities: No cyanosis, clubbing, edema  Central nervous system: Alert and oriented. No focal neurological deficits. Moving extremities Skin: No rashes, lesions or ulcers Psychiatry: Very flat affect    Data Reviewed: I have personally reviewed following labs and imaging studies  CBC: Recent Labs  Lab 02/18/19 1417 02/19/19 0505  WBC 8.2 6.3  NEUTROABS 5.4  --   HGB 15.2 15.5  HCT 45.9 45.9  MCV 93.7 89.6  PLT 247 0000000   Basic Metabolic Panel: Recent Labs  Lab 02/18/19 1417 02/19/19 0505  NA 133* 133*  K 4.4 4.7  CL 98 98  CO2 23 23  GLUCOSE 109* 125*  BUN <5* <5*  CREATININE 0.71 0.73  CALCIUM 9.2 9.5   GFR: Estimated Creatinine Clearance: 88.9 mL/min (by C-G formula based on SCr of 0.73 mg/dL). Liver Function Tests: No results for input(s): AST, ALT, ALKPHOS, BILITOT, PROT, ALBUMIN in the last 168 hours. No results for input(s): LIPASE, AMYLASE in the last 168 hours. No results  for input(s): AMMONIA in the last 168 hours. Coagulation Profile: No results for input(s): INR, PROTIME in the last 168 hours. Cardiac Enzymes: No results for input(s): CKTOTAL, CKMB, CKMBINDEX, TROPONINI in the last 168 hours. BNP (last 3 results) No results for input(s): PROBNP in the last 8760 hours. HbA1C: No results for input(s): HGBA1C in the last 72 hours. CBG: No results for input(s): GLUCAP in the last 168 hours. Lipid Profile: No results for input(s): CHOL,  HDL, LDLCALC, TRIG, CHOLHDL, LDLDIRECT in the last 72 hours. Thyroid Function Tests: No results for input(s): TSH, T4TOTAL, FREET4, T3FREE, THYROIDAB in the last 72 hours. Anemia Panel: No results for input(s): VITAMINB12, FOLATE, FERRITIN, TIBC, IRON, RETICCTPCT in the last 72 hours. Sepsis Labs: No results for input(s): PROCALCITON, LATICACIDVEN in the last 168 hours.  Recent Results (from the past 240 hour(s))  SARS CORONAVIRUS 2 (TAT 6-24 HRS) Nasopharyngeal Nasopharyngeal Swab     Status: None   Collection Time: 02/18/19  2:58 PM   Specimen: Nasopharyngeal Swab  Result Value Ref Range Status   SARS Coronavirus 2 NEGATIVE NEGATIVE Final    Comment: (NOTE) SARS-CoV-2 target nucleic acids are NOT DETECTED. The SARS-CoV-2 RNA is generally detectable in upper and lower respiratory specimens during the acute phase of infection. Negative results do not preclude SARS-CoV-2 infection, do not rule out co-infections with other pathogens, and should not be used as the sole basis for treatment or other patient management decisions. Negative results must be combined with clinical observations, patient history, and epidemiological information. The expected result is Negative. Fact Sheet for Patients: SugarRoll.be Fact Sheet for Healthcare Providers: https://www.woods-mathews.com/ This test is not yet approved or cleared by the [place] FDA and  has been authorized for detection and/or diagnosis of SARS-CoV-2 by FDA under an Emergency Use Authorization (EUA). This EUA will remain  in effect (meaning this test can be used) for the duration of the COVID-19 declaration under Section 56 4(b)(1) of the Act, 21 U.S.C. section 360bbb-3(b)(1), unless the authorization is terminated or revoked sooner. Performed at [facility], [place] [address], [place], [place] [address]          Radiology Studies: Dg Chest Port 1 View  Result Date:  02/18/2019 CLINICAL DATA:  Respiratory distress. EXAM: PORTABLE CHEST 1 VIEW COMPARISON:  PA and lateral chest 01/29/2019. FINDINGS: The lungs are clear. The patient has severe emphysema. No pneumothorax or pleural effusion. Heart size is normal. No acute or focal bony abnormality. IMPRESSION: No acute disease. Severe emphysema. Electronically Signed   By: Inge Rise M.D.   On: 02/18/2019 15:12        Scheduled Meds: . atorvastatin  10 mg Oral q1800  . benztropine  2 mg Oral BID  . carvedilol  3.125 mg Oral BID WC  . doxycycline  100 mg Oral Q12H  . enalapril  10 mg Oral Daily  . enoxaparin (LOVENOX) injection  40 mg Subcutaneous Q24H  . ipratropium-albuterol  3 mL Nebulization Q6H  . methylPREDNISolone (SOLU-MEDROL) injection  80 mg Intravenous Q12H  . mirtazapine  30 mg Oral QHS  . mometasone-formoterol  2 puff Inhalation BID  . nicotine  14 mg Transdermal Daily  . risperidone  4 mg Oral BID   Continuous Infusions:        Aline August, MD [facility] 02/19/2019, 10:30 AM

## 2019-02-19 NOTE — Evaluation (Signed)
Occupational Therapy Evaluation Patient Details Name: Blake Mcknight MRN: [id number] DOB: Aug 04, 1955 Today's Date: 02/19/2019    History of Present Illness Pt is a 63 year old man with hx of schizophrenia, HTN, COPD on 2L home 02, current smoker who was admitted on 02/18/19 with COPD exacerbation.   Clinical Impression   Pt was independent prior to admission in ADL and mobility and reliant on his sister for IADL. He sits to shower. Pt presents with poor activity tolerance and SOB with minimal exertion. Will follow acutely to continue education in energy conservation and breathing techniques and increasing activity tolerance.    Follow Up Recommendations  No OT follow up    Equipment Recommendations  None recommended by OT    Recommendations for Other Services       Precautions / Restrictions        Mobility Bed Mobility Overal bed mobility: Independent                Transfers Overall transfer level: Independent                    Balance Overall balance assessment: No apparent balance deficits (not formally assessed)                                         ADL either performed or assessed with clinical judgement   ADL Overall ADL's : Modified independent                                       General ADL Comments: began educating pt in pursed lip breathing techniques and energy conservation strategies     Vision Patient Visual Report: No change from baseline       Perception     Praxis      Pertinent Vitals/Pain Pain Assessment: No/denies pain     Hand Dominance Right   Extremity/Trunk Assessment Upper Extremity Assessment Upper Extremity Assessment: Overall WFL for tasks assessed   Lower Extremity Assessment Lower Extremity Assessment: Defer to PT evaluation   Cervical / Trunk Assessment Cervical / Trunk Assessment: Normal   Communication Communication Communication: No difficulties    Cognition Arousal/Alertness: Awake/alert Behavior During Therapy: WFL for tasks assessed/performed Overall Cognitive Status: Within Functional Limits for tasks assessed                                     General Comments       Exercises     Shoulder Instructions      Home Living Family/patient expects to be discharged to:: Private residence Living Arrangements: Other relatives(sister) Available Help at Discharge: Family;Available PRN/intermittently Type of Home: House Home Access: Stairs to enter [employer] of Steps: 3 Entrance Stairs-Rails: Right;Left;Can reach both Home Layout: One level     Bathroom Shower/Tub: Teacher, early years/pre: Standard     Home Equipment: Civil engineer, contracting;Other (comment)(home 02)          Prior Functioning/Environment Level of Independence: Independent                 OT Problem List: Decreased activity tolerance      OT Treatment/Interventions: Self-care/ADL training;Energy conservation;Patient/family education    OT Goals(Current goals can  be found in the care plan section) Acute Rehab OT Goals Patient Stated Goal: return home OT Goal Formulation: With patient Time For Goal Achievement: 03/05/19 Potential to Achieve Goals: Good ADL Goals Additional ADL Goal #1: Pt will complete ADL using pursed lip breathing and energy conservation strategies as instructed independently.  OT Frequency: Min 2X/week   Barriers to D/C:            Co-evaluation              AM-PAC OT "6 Clicks" Daily Activity     Outcome Measure Help from another person eating meals?: None Help from another person taking care of personal grooming?: None Help from another person toileting, which includes using toliet, bedpan, or urinal?: None Help from another person bathing (including washing, rinsing, drying)?: None Help from another person to put on and taking off regular upper body clothing?: None Help from  another person to put on and taking off regular lower body clothing?: None 6 Click Score: 24   End of Session Equipment Utilized During Treatment: Oxygen(2L) Nurse Communication: Other (comment)(pt requesting breathing treatment)  Activity Tolerance: Patient limited by fatigue Patient left: in bed;with call bell/phone within reach  OT Visit Diagnosis: Other (comment)(decreased activity tolerance)                Time: [id number] OT Time Calculation (min): 15 min Charges:  OT General Charges $OT Visit: 1 Visit OT Evaluation $OT Eval Moderate Complexity: 1 Mod  Nestor Lewandowsky, OTR/L Acute Rehabilitation Services Pager: [phone] Office: [phone]  Malka So 02/19/2019, 12:36 PM

## 2019-02-19 NOTE — Evaluation (Signed)
Physical Therapy Evaluation Patient Details Name: Blake Mcknight MRN: [id number] DOB: [birth year] Today's Date: 02/19/2019   History of Present Illness  63 y.o. male with medical history significant of schizophrenia; HTN; HLD; and COPD on2L home O2 continuing to smoke presenting with respiratory distress.  Clinical Impression  Pt demonstrates deficits in cardiopulmonary function during session. Pt is able to ambulate at a supervision level, with assistance requirements due to increased work of breathing and fatigue, otherwise pt is ambulating independently. Pt demonstrates no significant deficits in strength or balance. Pt will benefit from continued acute PT services to progress activity tolerance and teach energy conservation strategies. PT also recommends the patient receive outpatient cardiopulmonary rehab.    Follow Up Recommendations Outpatient PT(cardiopulmonary rehab)    Equipment Recommendations  None recommended by PT    Recommendations for Other Services       Precautions / Restrictions Precautions Precautions: Fall Restrictions Weight Bearing Restrictions: No      Mobility  Bed Mobility Overal bed mobility: Independent                Transfers Overall transfer level: Independent                  Ambulation/Gait Ambulation/Gait assistance: Supervision Gait Distance (Feet): 60 Feet Assistive device: None Gait Pattern/deviations: Step-through pattern Gait velocity: functional Gait velocity interpretation: 1.31 - 2.62 ft/sec, indicative of limited community ambulator General Gait Details: steady step through gait, PT providing supervision due to increased work of breathing  Stairs Stairs: (pt fatiguing quickly, not assessed this session)          Wheelchair Mobility    Modified Rankin (Stroke Patients Only)       Balance Overall balance assessment: Independent                                           Pertinent  Vitals/Pain Pain Assessment: No/denies pain    Home Living Family/patient expects to be discharged to:: Private residence Living Arrangements: Other relatives(sister) Available Help at Discharge: Family;Available PRN/intermittently Type of Home: House Home Access: Stairs to enter Entrance Stairs-Rails: Right;Left;Can reach both Entrance Stairs-Number of Steps: 3 Home Layout: One level Home Equipment: Shower seat;Other (comment)(oxygen)      Prior Function Level of Independence: Independent         Comments: pt reports he often has difficulty breathing with mobility, which can take 10-15 minutes to recover. Pt on 2L [place] at home.     Hand Dominance   Dominant Hand: Right    Extremity/Trunk Assessment   Upper Extremity Assessment Upper Extremity Assessment: Overall WFL for tasks assessed    Lower Extremity Assessment Lower Extremity Assessment: Overall WFL for tasks assessed    Cervical / Trunk Assessment Cervical / Trunk Assessment: Normal  Communication   Communication: No difficulties  Cognition Arousal/Alertness: Awake/alert Behavior During Therapy: WFL for tasks assessed/performed Overall Cognitive Status: Within Functional Limits for tasks assessed                                        General Comments General comments (skin integrity, edema, etc.): Pt on 2L [place] during session, noted increased work of breathing quickly during mobility. Pt saturating fine during mobility but with desat to 90% after sitting, recovering to 93% after ~5  minutes of rest    Exercises     Assessment/Plan    PT Assessment Patient needs continued PT services  PT Problem List Cardiopulmonary status limiting activity       PT Treatment Interventions Gait training;Stair training;Other (comment)(cardiopulmonary rehab)    PT Goals (Current goals can be found in the Care Plan section)  Acute Rehab PT Goals Patient Stated Goal: return home PT Goal Formulation: With  patient Time For Goal Achievement: 03/05/19 Potential to Achieve Goals: Good Additional Goals Additional Goal #1: Pt will maintain dynamic standing balance within 10 inches of his base of support independently without UE support    Frequency Min 3X/week   Barriers to discharge        Co-evaluation               AM-PAC PT "6 Clicks" Mobility  Outcome Measure Help needed turning from your back to your side while in a flat bed without using bedrails?: None Help needed moving from lying on your back to sitting on the side of a flat bed without using bedrails?: None Help needed moving to and from a bed to a chair (including a wheelchair)?: None Help needed standing up from a chair using your arms (e.g., wheelchair or bedside chair)?: None Help needed to walk in hospital room?: None Help needed climbing 3-5 steps with a railing? : A Little 6 Click Score: 23    End of Session Equipment Utilized During Treatment: Oxygen Activity Tolerance: Patient limited by fatigue Patient left: in bed;with call bell/phone within reach Nurse Communication: Mobility status PT Visit Diagnosis: Other abnormalities of gait and mobility (R26.89)    Time: [id number] PT Time Calculation (min) (ACUTE ONLY): 14 min   Charges:   PT Evaluation $PT Eval Low Complexity: [place], PT, DPT Acute Rehabilitation Pager: [phone]   Zenaida Niece 02/19/2019, 5:21 PM

## 2019-02-20 DIAGNOSIS — E785 Hyperlipidemia, unspecified: Secondary | ICD-10-CM | POA: Diagnosis not present

## 2019-02-20 DIAGNOSIS — F203 Undifferentiated schizophrenia: Secondary | ICD-10-CM | POA: Diagnosis not present

## 2019-02-20 DIAGNOSIS — J441 Chronic obstructive pulmonary disease with (acute) exacerbation: Secondary | ICD-10-CM | POA: Diagnosis not present

## 2019-02-20 DIAGNOSIS — F17200 Nicotine dependence, unspecified, uncomplicated: Secondary | ICD-10-CM | POA: Diagnosis not present

## 2019-02-20 MED ORDER — PREDNISONE 20 MG PO TABS: 40.0000 mg | ORAL_TABLET | Freq: Every day | ORAL | 0 refills | Status: AC

## 2019-02-20 MED ORDER — ALBUTEROL SULFATE HFA 108 (90 BASE) MCG/ACT IN AERS: 2.0000 | INHALATION_SPRAY | RESPIRATORY_TRACT | Status: DC | PRN

## 2019-02-20 MED ORDER — ACETAMINOPHEN 325 MG PO TABS
650.0000 mg | ORAL_TABLET | ORAL | Status: DC | PRN
  Administered 2019-02-20: 650 mg via ORAL
  Filled 2019-02-20: qty 2

## 2019-02-20 MED ORDER — ALBUTEROL SULFATE (2.5 MG/3ML) 0.083% IN NEBU: 2.5000 mg | INHALATION_SOLUTION | RESPIRATORY_TRACT | Status: DC | PRN

## 2019-02-20 MED ORDER — NICOTINE 14 MG/24HR TD PT24: 14.0000 mg | MEDICATED_PATCH | TRANSDERMAL | 0 refills | Status: DC

## 2019-02-20 MED ORDER — CARVEDILOL 3.125 MG PO TABS: 3.1250 mg | ORAL_TABLET | Freq: Two times a day (BID) | ORAL | 0 refills | Status: DC

## 2019-02-20 MED ORDER — IPRATROPIUM-ALBUTEROL 0.5-2.5 (3) MG/3ML IN SOLN
3.0000 mL | Freq: Three times a day (TID) | RESPIRATORY_TRACT | Status: DC
  Administered 2019-02-20: 3 mL via RESPIRATORY_TRACT
  Filled 2019-02-20: qty 3

## 2019-02-20 MED ORDER — GUAIFENESIN-DM 100-10 MG/5ML PO SYRP: 10.0000 mL | ORAL_SOLUTION | ORAL | 0 refills | Status: DC | PRN

## 2019-02-20 NOTE — Plan of Care (Signed)
  Problem: Health Behavior/Discharge Planning: Goal: Ability to manage health-related needs will improve Outcome: Progressing   Problem: Clinical Measurements: Goal: Ability to maintain clinical measurements within normal limits will improve Outcome: Progressing Goal: Will remain free from infection Outcome: Progressing Goal: Diagnostic test results will improve Outcome: Progressing Goal: Respiratory complications will improve Outcome: Progressing Goal: Cardiovascular complication will be avoided Outcome: Progressing   Problem: Pain Managment: Goal: General experience of comfort will improve Outcome: Progressing   Problem: Education: Goal: Knowledge of disease or condition will improve Outcome: Progressing Goal: Knowledge of the prescribed therapeutic regimen will improve Outcome: Progressing Goal: Individualized Educational Video(s) Outcome: Progressing   Problem: Respiratory: Goal: Ability to maintain a clear airway will improve Outcome: Progressing Goal: Levels of oxygenation will improve Outcome: Progressing Goal: Ability to maintain adequate ventilation will improve Outcome: Progressing

## 2019-02-20 NOTE — TOC Transition Note (Signed)
Transition of Care [facility]) - CM/SW Discharge Note   Patient Details  Name: Blake Mcknight MRN: [id number] Date of Birth: 09/21/1955  Transition of Care [facility] CM/SW Contact:  Carles Collet, RN Phone Number: 02/20/2019, 10:22 AM   Clinical Narrative:    Patient from home w sister. Oxygen PTA.  Coverage through Medicare and Medicaid. Could not reach patient, called sister, she will provide transport home and will provide O2 for transport. She was going to put transport tank "on the charger" now to have it ready.  PCP listed, patient to call and schedule appointment for this week.  No other CM needs identified.    Final next level of care: Home/Self Care Barriers to Discharge: No Barriers Identified   Patient Goals and CMS Choice        Discharge Placement                       Discharge Plan and Services                                     Social Determinants of Health (SDOH) Interventions     Readmission Risk Interventions No flowsheet data found.

## 2019-02-20 NOTE — Discharge Summary (Addendum)
Physician Discharge Summary  Blake Mcknight [id number] DOB: 1955-07-15 DOA: 02/18/2019  PCP: [name], MD  Admit date: 02/18/2019 Discharge date: 02/20/2019  Admitted From: Home Disposition: Home  Recommendations for Outpatient Follow-up:  1. Follow up with PCP in 1 week with repeat CBC/BMP 2. Follow up in ED if symptoms worsen or new appear   Home Health: No Equipment/Devices: Oxygen via nasal cannula at 2 L/min.  Patient was on the same amount of oxygen prior to presentation.  Discharge Condition: Stable CODE STATUS: Full Diet recommendation: Heart healthy  Brief/Interim Summary: 63 year old male with history of schizophrenia, hypertension, hyperlipidemia, COPD on 2 L oxygen at home by nasal cannula who continues to smoke presented with worsening shortness of breath.  COVID-19 test was negative.  He was admitted for COPD exacerbation and started on intravenous Solu-Medrol.  During the hospitalization, his condition improved.  He is at his baseline oxygen requirement.  He feels better and wants to go home.  He will be discharged on oral prednisone.  Discharge Diagnoses:  COPD exacerbation Chronic hypoxic respiratory failure Chronic tobacco abuse ongoing -Patient wears 2 L oxygen at home via nasal cannula.  Currently on the same amount of oxygen. -COVID-19 testing negative -Started on Solu-Medrol 80 mg IV every 12 hours which was decreased to 40 mg IV every 8 hours on 02/19/2019.  Currently feels much better and wants to go home.  We will switch to oral prednisone 40 mg daily for 7 days.   Continue home inhaler/nebulizer regimen.  Outpatient follow-up with PCP and/or pulmonary -Counseled regarding tobacco cessation  Hypertension -Monitor blood pressure.  Continue Coreg and enalapril  Hyperlipidemia -Continue Lipitor  Schizophrenia -Continue Remeron, Risperdal, trazodone and Cogentin.  Outpatient follow-up with psychiatry    Discharge  Instructions  Discharge Instructions    Diet - low sodium heart healthy   Complete by: As directed    Increase activity slowly   Complete by: As directed      Allergies as of 02/20/2019   No Known Allergies     Medication List    STOP taking these medications   chlorpheniramine-HYDROcodone 10-8 MG/5ML Suer Commonly known as: TUSSIONEX     TAKE these medications   acetaminophen 325 MG tablet Commonly known as: TYLENOL Take 2 tablets (650 mg total) by mouth every 6 (six) hours as needed for mild pain, moderate pain, fever or headache (or Fever >/= 101).   Advair HFA 115-21 MCG/ACT inhaler Generic drug: fluticasone-salmeterol Inhale 1 puff into the lungs 2 (two) times daily.   albuterol 108 (90 Base) MCG/ACT inhaler Commonly known as: VENTOLIN HFA Inhale 2 puffs into the lungs every 4 (four) hours as needed for wheezing or shortness of breath.   albuterol (2.5 MG/3ML) 0.083% nebulizer solution Commonly known as: PROVENTIL Take 3 mLs (2.5 mg total) by nebulization every 3 (three) hours as needed for wheezing or shortness of breath.   atorvastatin 10 MG tablet Commonly known as: LIPITOR Take 10 mg by mouth daily at 6 PM.   B-12 PO Take 1 tablet by mouth every other day.   benztropine 2 MG tablet Commonly known as: COGENTIN Take 1 tablet (2 mg total) by mouth 2 (two) times daily.   carvedilol 3.125 MG tablet Commonly known as: COREG Take 1 tablet (3.125 mg total) by mouth 2 (two) times daily with a meal. What changed:   when to take this  reasons to take this   cetirizine 10 MG tablet Commonly known as: ZYRTEC Take 10 mg  by mouth daily as needed for allergies or rhinitis.   enalapril 10 MG tablet Commonly known as: VASOTEC Take 10 mg by mouth daily.   famotidine 40 MG tablet Commonly known as: PEPCID Take 40 mg by mouth 2 (two) times daily as needed for heartburn or indigestion.   guaiFENesin-dextromethorphan 100-10 MG/5ML syrup Commonly known as:  ROBITUSSIN DM Take 10 mLs by mouth every 4 (four) hours as needed for cough.   ibuprofen 200 MG tablet Commonly known as: ADVIL Take 200 mg by mouth every 6 (six) hours as needed for fever.   mirtazapine 30 MG tablet Commonly known as: REMERON Take 1 tablet (30 mg total) by mouth at bedtime.   multivitamin Tabs tablet Take 1 tablet by mouth daily.   nicotine 14 mg/24hr patch Commonly known as: NICODERM CQ - dosed in mg/24 hours Place 1 patch (14 mg total) onto the skin daily.   OXYGEN Inhale 2 L/min into the lungs continuous.   predniSONE 20 MG tablet Commonly known as: Deltasone Take 2 tablets (40 mg total) by mouth daily with breakfast for 7 days. What changed:   medication strength  how much to take  when to take this  additional instructions   risperidone 4 MG tablet Commonly known as: RISPERDAL Take 1 tablet (4 mg total) by mouth 2 (two) times daily.   traZODone 50 MG tablet Commonly known as: DESYREL Take 1 tablet (50 mg total) by mouth at bedtime as needed for sleep.   VITAMIN C PO Take 1 tablet by mouth 3 (three) times a week.   Vitamin D3 50 MCG (2000 UT) Tabs Take 2,000-4,000 Units by mouth daily with breakfast.       Follow-up Information    [name], MD. Schedule an appointment as soon as possible for a visit in 1 week(s).   Specialty: Pulmonary Disease Contact information: [place] [address] [phone]          No Known Allergies  Consultations:  None   Procedures/Studies: Dg Chest 2 View  Result Date: 01/29/2019 CLINICAL DATA:  Shortness of breath, cough, emphysema EXAM: CHEST - 2 VIEW COMPARISON:  10/24/2018 FINDINGS: The heart size and mediastinal contours are within normal limits. Unusually severe, bullous emphysema. The visualized skeletal structures are unremarkable. IMPRESSION: Unusually severe, bullous emphysema without acute appearing airspace opacity. Electronically Signed   By: Eddie Candle M.D.   On: 01/29/2019 16:58   Dg Chest Port 1 View  Result Date: 02/18/2019 CLINICAL DATA:  Respiratory distress. EXAM: PORTABLE CHEST 1 VIEW COMPARISON:  PA and lateral chest 01/29/2019. FINDINGS: The lungs are clear. The patient has severe emphysema. No pneumothorax or pleural effusion. Heart size is normal. No acute or focal bony abnormality. IMPRESSION: No acute disease. Severe emphysema. Electronically Signed   By: Inge Rise M.D.   On: 02/18/2019 15:12       Subjective: Patient seen and examined at bedside.  He is a poor historian but feels better and wants to go home.  No worsening shortness of breath or fever.  Discharge Exam: Vitals:   02/20/19 0742 02/20/19 0809  BP:  137/79  Pulse:  (!) 104  Resp:  16  Temp:  98.4 F (36.9 C)  SpO2: 98% 97%    General: Pt is alert, awake, not in acute distress.  Poor historian. Cardiovascular: Slightly tachycardic, S1/S2 + Respiratory: bilateral decreased breath sounds at bases with only very minimal scattered wheezing. Abdominal: Soft, NT, ND, bowel sounds + Extremities:  no edema, no cyanosis    The results of significant diagnostics from this hospitalization (including imaging, microbiology, ancillary and laboratory) are listed below for reference.     Microbiology: Recent Results (from the past 240 hour(s))  SARS CORONAVIRUS 2 (TAT 6-24 HRS) Nasopharyngeal Nasopharyngeal Swab     Status: None   Collection Time: 02/18/19  2:58 PM   Specimen: Nasopharyngeal Swab  Result Value Ref Range Status   SARS Coronavirus 2 NEGATIVE NEGATIVE Final    Comment: (NOTE) SARS-CoV-2 target nucleic acids are NOT DETECTED. The SARS-CoV-2 RNA is generally detectable in upper and lower respiratory specimens during the acute phase of infection. Negative results do not preclude SARS-CoV-2 infection, do not rule out co-infections with other pathogens, and should not be used as the sole basis for treatment or other patient management  decisions. Negative results must be combined with clinical observations, patient history, and epidemiological information. The expected result is Negative. Fact Sheet for Patients: SugarRoll.be Fact Sheet for Healthcare Providers: https://www.woods-mathews.com/ This test is not yet approved or cleared by the [place] FDA and  has been authorized for detection and/or diagnosis of SARS-CoV-2 by FDA under an Emergency Use Authorization (EUA). This EUA will remain  in effect (meaning this test can be used) for the duration of the COVID-19 declaration under Section 56 4(b)(1) of the Act, 21 U.S.C. section 360bbb-3(b)(1), unless the authorization is terminated or revoked sooner. Performed at [facility], [place] [address], [place], [place] [address]      Labs: BNP (last 3 results) No results for input(s): BNP in the last 8760 hours. Basic Metabolic Panel: Recent Labs  Lab 02/18/19 1417 02/19/19 0505  NA 133* 133*  K 4.4 4.7  CL 98 98  CO2 23 23  GLUCOSE 109* 125*  BUN <5* <5*  CREATININE 0.71 0.73  CALCIUM 9.2 9.5   Liver Function Tests: No results for input(s): AST, ALT, ALKPHOS, BILITOT, PROT, ALBUMIN in the last 168 hours. No results for input(s): LIPASE, AMYLASE in the last 168 hours. No results for input(s): AMMONIA in the last 168 hours. CBC: Recent Labs  Lab 02/18/19 1417 02/19/19 0505  WBC 8.2 6.3  NEUTROABS 5.4  --   HGB 15.2 15.5  HCT 45.9 45.9  MCV 93.7 89.6  PLT 247 243   Cardiac Enzymes: No results for input(s): CKTOTAL, CKMB, CKMBINDEX, TROPONINI in the last 168 hours. BNP: Invalid input(s): POCBNP CBG: No results for input(s): GLUCAP in the last 168 hours. D-Dimer No results for input(s): DDIMER in the last 72 hours. Hgb A1c No results for input(s): HGBA1C in the last 72 hours. Lipid Profile No results for input(s): CHOL, HDL, LDLCALC, TRIG, CHOLHDL, LDLDIRECT in the last 72 hours. Thyroid  function studies No results for input(s): TSH, T4TOTAL, T3FREE, THYROIDAB in the last 72 hours.  Invalid input(s): FREET3 Anemia work up No results for input(s): VITAMINB12, FOLATE, FERRITIN, TIBC, IRON, RETICCTPCT in the last 72 hours. Urinalysis    Component Value Date/Time   COLORURINE AMBER (A) 05/25/2016 0554   APPEARANCEUR HAZY (A) 05/25/2016 0554   LABSPEC 1.015 01/29/2019 1634   PHURINE 6.5 01/29/2019 1634   GLUCOSEU NEGATIVE 01/29/2019 1634   HGBUR TRACE (A) 01/29/2019 1634   BILIRUBINUR NEGATIVE 01/29/2019 [place] 01/29/2019 1634   PROTEINUR NEGATIVE 01/29/2019 1634   UROBILINOGEN 0.2 01/29/2019 1634   NITRITE NEGATIVE 01/29/2019 1634   LEUKOCYTESUR NEGATIVE 01/29/2019 1634   Sepsis Labs Invalid input(s): PROCALCITONIN,  WBC,  LACTICIDVEN Microbiology  Recent Results (from the past 240 hour(s))  SARS CORONAVIRUS 2 (TAT 6-24 HRS) Nasopharyngeal Nasopharyngeal Swab     Status: None   Collection Time: 02/18/19  2:58 PM   Specimen: Nasopharyngeal Swab  Result Value Ref Range Status   SARS Coronavirus 2 NEGATIVE NEGATIVE Final    Comment: (NOTE) SARS-CoV-2 target nucleic acids are NOT DETECTED. The SARS-CoV-2 RNA is generally detectable in upper and lower respiratory specimens during the acute phase of infection. Negative results do not preclude SARS-CoV-2 infection, do not rule out co-infections with other pathogens, and should not be used as the sole basis for treatment or other patient management decisions. Negative results must be combined with clinical observations, patient history, and epidemiological information. The expected result is Negative. Fact Sheet for Patients: SugarRoll.be Fact Sheet for Healthcare Providers: https://www.woods-mathews.com/ This test is not yet approved or cleared by the [place] FDA and  has been authorized for detection and/or diagnosis of SARS-CoV-2 by FDA under an  Emergency Use Authorization (EUA). This EUA will remain  in effect (meaning this test can be used) for the duration of the COVID-19 declaration under Section 56 4(b)(1) of the Act, 21 U.S.C. section 360bbb-3(b)(1), unless the authorization is terminated or revoked sooner. Performed at [facility], [place] [address] [place], [place] [address]      Time coordinating discharge: 35 minutes  SIGNED:   Aline August, MD  [facility] 02/20/2019, 10:11 AM

## 2019-02-20 NOTE — Progress Notes (Signed)
Discharge instructions given to Blake Mcknight and his sister Blake Mcknight.  Discussed follow up appointments, and signs and symptoms to watch for and when to contact the physician.  Discussed medication changes, new medications and side effects to watch for. Verbalized understanding.

## 2019-02-20 NOTE — Plan of Care (Signed)
  Problem: Health Behavior/Discharge Planning: Goal: Ability to manage health-related needs will improve 02/20/2019 1639 by Glenard Haring, RN Outcome: Adequate for Discharge 02/20/2019 1058 by Glenard Haring, RN Outcome: Progressing 02/20/2019 1057 by Glenard Haring, RN Outcome: Progressing   Problem: Clinical Measurements: Goal: Ability to maintain clinical measurements within normal limits will improve 02/20/2019 1639 by Glenard Haring, RN Outcome: Adequate for Discharge 02/20/2019 1058 by Glenard Haring, RN Outcome: Progressing 02/20/2019 1057 by Glenard Haring, RN Outcome: Progressing Goal: Will remain free from infection 02/20/2019 1639 by Glenard Haring, RN Outcome: Adequate for Discharge 02/20/2019 1058 by Glenard Haring, RN Outcome: Progressing 02/20/2019 1057 by Glenard Haring, RN Outcome: Progressing Goal: Diagnostic test results will improve 02/20/2019 1639 by Glenard Haring, RN Outcome: Adequate for Discharge 02/20/2019 1058 by Glenard Haring, RN Outcome: Progressing 02/20/2019 1057 by Glenard Haring, RN Outcome: Progressing Goal: Respiratory complications will improve 02/20/2019 1639 by Glenard Haring, RN Outcome: Adequate for Discharge 02/20/2019 1058 by Glenard Haring, RN Outcome: Progressing 02/20/2019 1057 by Glenard Haring, RN Outcome: Progressing Goal: Cardiovascular complication will be avoided 02/20/2019 1639 by Glenard Haring, RN Outcome: Adequate for Discharge 02/20/2019 1058 by Glenard Haring, RN Outcome: Progressing 02/20/2019 1057 by Glenard Haring, RN Outcome: Progressing   Problem: Pain Managment: Goal: General experience of comfort will improve 02/20/2019 1639 by Glenard Haring, RN Outcome: Adequate for Discharge 02/20/2019 1058 by Glenard Haring, RN Outcome: Progressing 02/20/2019 1057 by Glenard Haring, RN Outcome: Progressing   Problem: Education: Goal: Knowledge of disease or condition  will improve 02/20/2019 1639 by Glenard Haring, RN Outcome: Adequate for Discharge 02/20/2019 1058 by Glenard Haring, RN Outcome: Progressing 02/20/2019 1057 by Glenard Haring, RN Outcome: Progressing Goal: Knowledge of the prescribed therapeutic regimen will improve 02/20/2019 1639 by Glenard Haring, RN Outcome: Adequate for Discharge 02/20/2019 1058 by Glenard Haring, RN Outcome: Progressing 02/20/2019 1057 by Glenard Haring, RN Outcome: Progressing Goal: Individualized Educational Video(s) 02/20/2019 1639 by Glenard Haring, RN Outcome: Adequate for Discharge 02/20/2019 1058 by Glenard Haring, RN Outcome: Progressing 02/20/2019 1057 by Glenard Haring, RN Outcome: Progressing   Problem: Respiratory: Goal: Ability to maintain a clear airway will improve 02/20/2019 1639 by Glenard Haring, RN Outcome: Adequate for Discharge 02/20/2019 1058 by Glenard Haring, RN Outcome: Progressing 02/20/2019 1057 by Glenard Haring, RN Outcome: Progressing Goal: Levels of oxygenation will improve 02/20/2019 1639 by Glenard Haring, RN Outcome: Adequate for Discharge 02/20/2019 1058 by Glenard Haring, RN Outcome: Progressing 02/20/2019 1057 by Glenard Haring, RN Outcome: Progressing Goal: Ability to maintain adequate ventilation will improve 02/20/2019 1639 by Glenard Haring, RN Outcome: Adequate for Discharge 02/20/2019 1058 by Glenard Haring, RN Outcome: Progressing 02/20/2019 1057 by Glenard Haring, RN Outcome: Progressing

## 2019-03-01 DIAGNOSIS — Z79899 Other long term (current) drug therapy: Secondary | ICD-10-CM | POA: Diagnosis not present

## 2019-03-01 DIAGNOSIS — M899 Disorder of bone, unspecified: Secondary | ICD-10-CM | POA: Diagnosis not present

## 2019-03-01 DIAGNOSIS — J441 Chronic obstructive pulmonary disease with (acute) exacerbation: Secondary | ICD-10-CM | POA: Diagnosis not present

## 2019-03-01 DIAGNOSIS — M159 Polyosteoarthritis, unspecified: Secondary | ICD-10-CM | POA: Diagnosis not present

## 2019-03-01 DIAGNOSIS — E7800 Pure hypercholesterolemia, unspecified: Secondary | ICD-10-CM | POA: Diagnosis not present

## 2019-03-01 DIAGNOSIS — F319 Bipolar disorder, unspecified: Secondary | ICD-10-CM | POA: Diagnosis not present

## 2019-03-01 DIAGNOSIS — M2550 Pain in unspecified joint: Secondary | ICD-10-CM | POA: Diagnosis not present

## 2019-03-01 DIAGNOSIS — I119 Hypertensive heart disease without heart failure: Secondary | ICD-10-CM | POA: Diagnosis not present

## 2019-03-01 DIAGNOSIS — K267 Chronic duodenal ulcer without hemorrhage or perforation: Secondary | ICD-10-CM | POA: Diagnosis not present

## 2019-03-01 DIAGNOSIS — E559 Vitamin D deficiency, unspecified: Secondary | ICD-10-CM | POA: Diagnosis not present

## 2019-03-01 DIAGNOSIS — K2100 Gastro-esophageal reflux disease with esophagitis, without bleeding: Secondary | ICD-10-CM | POA: Diagnosis not present

## 2019-03-14 ENCOUNTER — Emergency Department (HOSPITAL_COMMUNITY): Payer: Medicare Other

## 2019-03-14 ENCOUNTER — Encounter (HOSPITAL_COMMUNITY): Payer: Self-pay | Admitting: Emergency Medicine

## 2019-03-14 ENCOUNTER — Other Ambulatory Visit: Payer: Self-pay

## 2019-03-14 ENCOUNTER — Emergency Department (HOSPITAL_COMMUNITY)
Admission: EM | Admit: 2019-03-14 | Discharge: 2019-03-14 | Disposition: A | Discharge status: Home / Self Care | Payer: Medicare Other | Attending: Emergency Medicine | Admitting: Emergency Medicine

## 2019-03-14 DIAGNOSIS — R319 Hematuria, unspecified: Secondary | ICD-10-CM | POA: Insufficient documentation

## 2019-03-14 DIAGNOSIS — J449 Chronic obstructive pulmonary disease, unspecified: Secondary | ICD-10-CM | POA: Insufficient documentation

## 2019-03-14 DIAGNOSIS — Z79899 Other long term (current) drug therapy: Secondary | ICD-10-CM | POA: Diagnosis not present

## 2019-03-14 DIAGNOSIS — I10 Essential (primary) hypertension: Secondary | ICD-10-CM | POA: Diagnosis not present

## 2019-03-14 DIAGNOSIS — Z720 Tobacco use: Secondary | ICD-10-CM | POA: Insufficient documentation

## 2019-03-14 DIAGNOSIS — F209 Schizophrenia, unspecified: Secondary | ICD-10-CM | POA: Diagnosis not present

## 2019-03-14 DIAGNOSIS — R1084 Generalized abdominal pain: Secondary | ICD-10-CM | POA: Insufficient documentation

## 2019-03-14 LAB — COMPREHENSIVE METABOLIC PANEL
ALT: 38 U/L (ref 0–44)
AST: 24 U/L (ref 15–41)
Albumin: 4 g/dL (ref 3.5–5.0)
Alkaline Phosphatase: 47 U/L (ref 38–126)
Anion gap: 12 (ref 5–15)
BUN: <5 mg/dL — ABNORMAL LOW (ref 8–23)
CO2: 26 mmol/L (ref 22–32)
Calcium: 9.6 mg/dL (ref 8.9–10.3)
Chloride: 100 mmol/L (ref 98–111)
Creatinine, Ser: 0.85 mg/dL (ref 0.61–1.24)
GFR calc Af Amer: >60 mL/min (ref >60)
GFR calc non Af Amer: >60 mL/min (ref >60)
Glucose, Bld: 100 mg/dL — ABNORMAL HIGH (ref 70–99)
Potassium: 4.2 mmol/L (ref 3.5–5.1)
Sodium: 138 mmol/L (ref 135–145)
Total Bilirubin: 0.9 mg/dL (ref 0.3–1.2)
Total Protein: 7 g/dL (ref 6.5–8.1)

## 2019-03-14 LAB — URINALYSIS, ROUTINE W REFLEX MICROSCOPIC
Bilirubin Urine: NEGATIVE
Glucose, UA: NEGATIVE mg/dL
Hgb urine dipstick: NEGATIVE
Ketones, ur: 5 mg/dL — AB
Leukocytes,Ua: NEGATIVE
Nitrite: NEGATIVE
Protein, ur: NEGATIVE mg/dL
Specific Gravity, Urine: 1.012 (ref 1.005–1.030)
pH: 7 (ref 5.0–8.0)

## 2019-03-14 LAB — CBC
HCT: 47.5 % (ref 39.0–52.0)
Hemoglobin: 15.8 g/dL (ref 13.0–17.0)
MCH: 31 pg (ref 26.0–34.0)
MCHC: 33.3 g/dL (ref 30.0–36.0)
MCV: 93.1 fL (ref 80.0–100.0)
Platelets: 237 10*3/uL (ref 150–400)
RBC: 5.1 MIL/uL (ref 4.22–5.81)
RDW: 12.9 % (ref 11.5–15.5)
WBC: 9.5 10*3/uL (ref 4.0–10.5)
nRBC: 0 % (ref 0.0–0.2)

## 2019-03-14 LAB — LIPASE, BLOOD: Lipase: 20 U/L (ref 11–51)

## 2019-03-14 MED ORDER — OMEPRAZOLE 20 MG PO CPDR: 20.0000 mg | DELAYED_RELEASE_CAPSULE | Freq: Every day | ORAL | 0 refills | Status: DC

## 2019-03-14 MED ORDER — SODIUM CHLORIDE 0.9% FLUSH: 3.0000 mL | Freq: Once | INTRAVENOUS | Status: DC

## 2019-03-14 NOTE — ED Triage Notes (Addendum)
C/o abd pain, started last night- denies vomiting or diarrhea  Pt lives with his sister [name] - he states she makes his medical decisions-- does not know her phone number-- pt has hx of schizophrenia and is a poor historian

## 2019-03-14 NOTE — ED Provider Notes (Signed)
[facility] EMERGENCY DEPARTMENT Provider Note   CSN: [id number] Arrival date & time: 03/14/19  [id number]     History Chief Complaint  Patient presents with  . Abdominal Pain    Blake Mcknight is a 63 y.o. male.  The history is provided by the patient and a relative. No language interpreter was used.  Abdominal Pain    63 year old male with history of schizophrenia, poor historian, COPD, tobacco use, hypertension, hyperlipidemia prior surgical history including cholecystectomy, ERCP presented to ED for evaluation of abdominal pain.  Patient reported having abdominal pain last night.  He was unable to tell me more about his abdominal pain but states that the pain has since resolved without any specific treatment and he does not want any additional evaluation.  He does not complain of any fever or chills no nausea vomiting or diarrhea no dysuria no back pain or trouble urinating.  He request for some food to eat.  No chest pain or shortness of breath or cough.  Past Medical History:  Diagnosis Date  . COPD (chronic obstructive pulmonary disease) (HCC)    with bullous emphysema.   Marland Kitchen Heavy cigarette smoker before 2003   pt claims only 10 cigs per day, never heavier amounts.   Marland Kitchen HLD (hyperlipidemia)   . Hypertension   . Schizophrenia ([place]) 06/28/2013   This is a chronic condition and he lives with family    Patient Active Problem List   Diagnosis Date Noted  . Do not resuscitate 02/18/2019  . Acute respiratory failure with hypercapnia ([place]) 06/16/2016  . Acute respiratory failure with hypoxia ([place]) 06/16/2016  . Acute on chronic respiratory failure with hypoxia ([place]) 05/25/2016  . Altered mental status 09/22/2015  . Hyponatremia 09/22/2015  . Elevated lactic acid level 09/22/2015  . Acute encephalopathy   . Bullous emphysema ([place]) 07/26/2013  . Schizophrenia ([place]) 06/28/2013  . Tobacco use disorder 06/06/2013  . Abdominal pain 06/03/2013  . Sepsis ([place])  06/03/2013  . Elevated LFTs 06/03/2013  . HTN (hypertension) 06/03/2013  . HLD (hyperlipidemia) 06/03/2013  . Chronic obstructive pulmonary disease with acute exacerbation ([place]) 06/03/2013  . Calculus of bile duct without mention of cholecystitis or obstruction 06/03/2013    Past Surgical History:  Procedure Laterality Date  . CHOLECYSTECTOMY N/A 06/06/2013   Procedure: LAPAROSCOPIC CHOLECYSTECTOMY WITH ATTEMPTED INTRAOPERATIVE CHOLANGIOGRAM;  Surgeon: Zenovia Jarred, MD;  Location: [facility];  Service: General;  Laterality: N/A;  . ERCP N/A 06/03/2013   Procedure: ENDOSCOPIC RETROGRADE CHOLANGIOPANCREATOGRAPHY (ERCP);  Surgeon: Ladene Artist, MD;  Location: [facility] ENDOSCOPY;  Service: Endoscopy;  Laterality: N/A;  . NO PAST SURGERIES         Family History  Problem Relation Age of Onset  . Diabetes Mellitus II Mother   . Diabetes Mother   . Heart disease Father   . Stroke Maternal Aunt   . CAD Neg Hx     Social History   Tobacco Use  . Smoking status: Current Every Day Smoker    Packs/day: 1.00    Years: 41.00    Pack years: 41.00    Types: Cigarettes  . Smokeless tobacco: Never Used  . Tobacco comment: Gets 4 puffs a day.   Substance Use Topics  . Alcohol use: No  . Drug use: No    Home Medications Prior to Admission medications   Medication Sig Start Date End Date Taking? Authorizing Provider  acetaminophen (TYLENOL) 325 MG tablet Take 2 tablets (650 mg total) by mouth  every 6 (six) hours as needed for mild pain, moderate pain, fever or headache (or Fever >/= 101). 06/18/16   Hongalgi, Lenis Dickinson, MD  ADVAIR [facility] 115-21 MCG/ACT inhaler Inhale 1 puff into the lungs 2 (two) times daily. 09/21/15   [provider]  albuterol (PROVENTIL) (2.5 MG/3ML) 0.083% nebulizer solution Take 3 mLs (2.5 mg total) by nebulization every 3 (three) hours as needed for wheezing or shortness of breath. 02/20/19   Aline August, MD  albuterol (VENTOLIN HFA) 108 (90 Base) MCG/ACT inhaler  Inhale 2 puffs into the lungs every 4 (four) hours as needed for wheezing or shortness of breath. 02/20/19   Aline August, MD  Ascorbic Acid (VITAMIN C PO) Take 1 tablet by mouth 3 (three) times a week.     [provider]  atorvastatin (LIPITOR) 10 MG tablet Take 10 mg by mouth daily at 6 PM.  09/21/15   [provider]  benztropine (COGENTIN) 2 MG tablet Take 1 tablet (2 mg total) by mouth 2 (two) times daily. 02/07/19   Arfeen, Arlyce Harman, MD  carvedilol (COREG) 3.125 MG tablet Take 1 tablet (3.125 mg total) by mouth 2 (two) times daily with a meal. 02/20/19   Aline August, MD  cetirizine (ZYRTEC) 10 MG tablet Take 10 mg by mouth daily as needed for allergies or rhinitis.    [provider]  Cholecalciferol (VITAMIN D3) 50 MCG (2000 UT) TABS Take 2,000-4,000 Units by mouth daily with breakfast.    [provider]  Cyanocobalamin (B-12 PO) Take 1 tablet by mouth every other day.     [provider]  enalapril (VASOTEC) 10 MG tablet Take 10 mg by mouth daily. 06/04/16   [provider]  famotidine (PEPCID) 40 MG tablet Take 40 mg by mouth 2 (two) times daily as needed for heartburn or indigestion.  12/28/18   [provider]  guaiFENesin-dextromethorphan (ROBITUSSIN DM) 100-10 MG/5ML syrup Take 10 mLs by mouth every 4 (four) hours as needed for cough. 02/20/19   Aline August, MD  ibuprofen (ADVIL) 200 MG tablet Take 200 mg by mouth every 6 (six) hours as needed for fever.    [provider]  mirtazapine (REMERON) 30 MG tablet Take 1 tablet (30 mg total) by mouth at bedtime. 02/07/19 02/07/20  Arfeen, Arlyce Harman, MD  multivitamin (ONE-A-DAY MEN'S) TABS tablet Take 1 tablet by mouth daily.    [provider]  nicotine (NICODERM CQ - DOSED IN MG/24 HOURS) 14 mg/24hr patch Place 1 patch (14 mg total) onto the skin daily. 02/20/19   Aline August, MD  OXYGEN Inhale 2 L/min into the lungs continuous.     [provider]    risperidone (RISPERDAL) 4 MG tablet Take 1 tablet (4 mg total) by mouth 2 (two) times daily. 02/07/19   Arfeen, Arlyce Harman, MD  traZODone (DESYREL) 50 MG tablet Take 1 tablet (50 mg total) by mouth at bedtime as needed for sleep. 02/07/19   Arfeen, Arlyce Harman, MD    Allergies    Patient has no known allergies.  Review of Systems   Review of Systems  Gastrointestinal: Positive for abdominal pain.  All other systems reviewed and are negative.   Physical Exam Updated Vital Signs BP 117/82   Pulse 97   Temp (!) 97.5 F (36.4 C) (Oral)   Resp 16   SpO2 95%   Physical Exam Vitals and nursing note reviewed.  Constitutional:      General: He is not in  acute distress.    Appearance: He is well-developed.  HENT:     Head: Atraumatic.  Eyes:     Conjunctiva/sclera: Conjunctivae normal.  Cardiovascular:     Rate and Rhythm: Normal rate and regular rhythm.  Pulmonary:     Effort: Pulmonary effort is normal.     Breath sounds: Normal breath sounds.  Abdominal:     General: Abdomen is flat. Bowel sounds are normal.     Palpations: Abdomen is soft.     Tenderness: There is no abdominal tenderness.  Musculoskeletal:     Cervical back: Neck supple.  Skin:    Findings: No rash.  Neurological:     Mental Status: He is alert and oriented to person, place, and time.  Psychiatric:        Mood and Affect: Mood normal.     ED Results / Procedures / Treatments   Labs (all labs ordered are listed, but only abnormal results are displayed) Labs Reviewed  COMPREHENSIVE METABOLIC PANEL - Abnormal; Notable for the following components:      Result Value   Glucose, Bld 100 (*)    BUN <5 (*)    All other components within normal limits  URINALYSIS, ROUTINE W REFLEX MICROSCOPIC - Abnormal; Notable for the following components:   Ketones, ur 5 (*)    All other components within normal limits  LIPASE, BLOOD  CBC    EKG None  Radiology US Renal  Result Date: 03/14/2019 CLINICAL DATA:   Hypodense lesion on recent CT EXAM: RENAL / URINARY TRACT ULTRASOUND COMPLETE COMPARISON:  CT from earlier in the same day. FINDINGS: Right Kidney: Renal measurements: 11.6 x 4.6 x 5.8 cm = volume: 162 mL . Echogenicity within normal limits. No mass or hydronephrosis visualized. Left Kidney: Renal measurements: 10.5 x 5.5 x 4.6 cm = volume: 139 mL. Scattered cysts are noted within the left kidney measuring up to 1.1 cm. These correspond with the lesion seen on the prior CT examination as well as a less well visualized cyst in the lower pole. No obstructive changes are noted. Bladder: Appears normal for degree of bladder distention. Other: Prostate is mildly prominent indenting upon the inferior aspect of the bladder. IMPRESSION: Left renal cysts.  No obstructive changes are noted. Electronically Signed   By: Inez Catalina M.D.   On: 03/14/2019 14:52   CT Renal Stone Study  Result Date: 03/14/2019 CLINICAL DATA:  Gross hematuria, pelvic pain EXAM: CT ABDOMEN AND PELVIS WITHOUT CONTRAST TECHNIQUE: Multidetector CT imaging of the abdomen and pelvis was performed following the standard protocol without IV contrast. COMPARISON:  06/03/2013 FINDINGS: Lower chest: Severe emphysematous changes in the lung bases, stable since prior study. Heart is normal size. Hepatobiliary: No focal liver abnormality is seen. Status post cholecystectomy. No biliary dilatation. Pancreas: No focal abnormality or ductal dilatation. Spleen: No focal abnormality.  Normal size. Adrenals/Urinary Tract: No renal or ureteral stones. No hydronephrosis. Adrenal glands unremarkable. Small low-density lesion in the midpole of the left kidney measures 14 mm and cannot be characterized on this noncontrast study. This is new since prior study. Urinary bladder unremarkable. Stomach/Bowel: Sigmoid diverticulosis. Moderate stool throughout the colon. Normal appendix. Stomach and small bowel decompressed, unremarkable. Vascular/Lymphatic: Aortic  atherosclerosis. No enlarged abdominal or pelvic lymph nodes. Reproductive: No visible focal abnormality. Other: No free fluid or free air. Musculoskeletal: No acute bony abnormality. IMPRESSION: No renal or ureteral stones.  No hydronephrosis. 14 mm low-density lesion in the midpole of the left kidney which  cannot be characterized on this noncontrast study. This could be further characterized and evaluated with renal ultrasound or contrast-enhanced CT. Severe emphysematous changes in the lung bases. Severe aortoiliac atherosclerosis. Sigmoid diverticulosis.  No active diverticulitis. Electronically Signed   By: Rolm Baptise M.D.   On: 03/14/2019 12:15    Procedures Procedures (including critical care time)  Medications Ordered in ED Medications  sodium chloride flush (NS) 0.9 % injection 3 mL (3 mLs Intravenous Not Given 03/14/19 1135)    ED Course  I have reviewed the triage vital signs and the nursing notes.  Pertinent labs & imaging results that were available during my care of the patient were reviewed by me and considered in my medical decision making (see chart for details).    MDM Rules/Calculators/A&P                      BP 117/82   Pulse 97   Temp (!) 97.5 F (36.4 C) (Oral)   Resp 16   SpO2 95%   Final Clinical Impression(s) / ED Diagnoses Final diagnoses:  Generalized abdominal pain    Rx / DC Orders ED Discharge Orders         Ordered    omeprazole (PRILOSEC) 20 MG capsule  Daily     03/14/19 1504         10:32 AM Patient is a poor historian, initially complaining of abdominal pain last night which has since resolved.  At this time he refused any other additional managements and requesting for food.  I reached out and spoke with his sister over the phone.  Sister report patient normally does not complain of abdominal pain and therefore she voiced concern and request for a CT scan of his abdomen pelvis.  She mention patient had some blood in his urine recently.   Patient also has history of COPD on 2 L home oxygen however he does not complain of any worsening shortness of breath and sister did not notice any worsening shortness of breath.  No recent sick contact with anyone with COVID-19.  12:43 PM UA without signs of urinary tract infection, no blood in urine to suggest kidney stone.  Labs are reassuring.  A CT renal stone study showed no evidence of renal stone or hydronephrosis.  However, there is a 14 mm low-density lesion in the midpole of the left kidney which may benefit from a renal ultrasound to further characterized.  Evidence of severe emphysematous changes in lung base.  Evidence of sigmoid diverticulosis but without active diverticulitis.  I will obtain renal ultrasound to evaluate for this lesion.  3:06 PM Renal US showing simple appearing renal cysts without complicated feature.  Reassurance given.  prilosec prescribed for abd pain, likely gastritis.     Domenic Moras, PA-C 03/14/19 1507    Pattricia Boss, MD 03/15/19 1409

## 2019-03-14 NOTE — ED Notes (Signed)
Attempted to make contact with pts sister to come and sit with pt due to his needs and unable to get in contact with her with numbers provided.

## 2019-03-14 NOTE — ED Notes (Signed)
Pts Sister Casimiro Needle  [phone] Home [phone] Cell

## 2019-03-14 NOTE — ED Notes (Signed)
Attempt x 3 to contact sister at request of patient. Unsuccessful.

## 2019-03-14 NOTE — Discharge Instructions (Signed)
Your abdominal CT shows left renal cysts, which is an incidental finding and not contributing to your symptoms today.  Your labs are normal.  Follow up with your doctor for further care.  Take prilosec as needed for heart burn.

## 2019-03-14 NOTE — ED Notes (Signed)
Patient was given a [place] Sandwich Bag, w/ Cup of [employer].

## 2019-03-14 NOTE — ED Notes (Signed)
Made contact with pts sister shes coming to sit with pt.

## 2019-03-14 NOTE — ED Notes (Signed)
Pt requesting something to "snack on"

## 2019-03-14 NOTE — ED Notes (Signed)
Sister reports that she would like it on record that pt wants her to be in charge of her medical decisions.

## 2019-04-05 DIAGNOSIS — Z1211 Encounter for screening for malignant neoplasm of colon: Secondary | ICD-10-CM | POA: Diagnosis not present

## 2019-04-05 DIAGNOSIS — Z1212 Encounter for screening for malignant neoplasm of rectum: Secondary | ICD-10-CM | POA: Diagnosis not present

## 2019-05-04 ENCOUNTER — Ambulatory Visit (HOSPITAL_COMMUNITY): Payer: Medicare Other | Admitting: Psychiatry

## 2019-05-05 ENCOUNTER — Other Ambulatory Visit: Payer: Self-pay

## 2019-05-05 ENCOUNTER — Ambulatory Visit (HOSPITAL_COMMUNITY): Payer: Medicare Other | Admitting: Psychiatry

## 2019-05-09 ENCOUNTER — Emergency Department (HOSPITAL_COMMUNITY): Payer: Medicare Other

## 2019-05-09 ENCOUNTER — Ambulatory Visit (INDEPENDENT_AMBULATORY_CARE_PROVIDER_SITE_OTHER): Payer: Medicare Other | Admitting: Psychiatry

## 2019-05-09 ENCOUNTER — Other Ambulatory Visit: Payer: Self-pay

## 2019-05-09 ENCOUNTER — Emergency Department (HOSPITAL_COMMUNITY)
Admission: EM | Admit: 2019-05-09 | Discharge: 2019-05-09 | Disposition: A | Discharge status: Home / Self Care | Payer: Medicare Other | Attending: Emergency Medicine | Admitting: Emergency Medicine

## 2019-05-09 ENCOUNTER — Encounter (HOSPITAL_COMMUNITY): Payer: Self-pay | Admitting: Emergency Medicine

## 2019-05-09 ENCOUNTER — Encounter (HOSPITAL_COMMUNITY): Payer: Self-pay | Admitting: Psychiatry

## 2019-05-09 DIAGNOSIS — I10 Essential (primary) hypertension: Secondary | ICD-10-CM | POA: Diagnosis not present

## 2019-05-09 DIAGNOSIS — Z79899 Other long term (current) drug therapy: Secondary | ICD-10-CM | POA: Insufficient documentation

## 2019-05-09 DIAGNOSIS — R0602 Shortness of breath: Secondary | ICD-10-CM | POA: Insufficient documentation

## 2019-05-09 DIAGNOSIS — R05 Cough: Secondary | ICD-10-CM | POA: Diagnosis not present

## 2019-05-09 DIAGNOSIS — F5105 Insomnia due to other mental disorder: Secondary | ICD-10-CM | POA: Diagnosis not present

## 2019-05-09 DIAGNOSIS — R000 Tachycardia, unspecified: Secondary | ICD-10-CM | POA: Diagnosis not present

## 2019-05-09 DIAGNOSIS — F99 Mental disorder, not otherwise specified: Secondary | ICD-10-CM

## 2019-05-09 DIAGNOSIS — F200 Paranoid schizophrenia: Secondary | ICD-10-CM

## 2019-05-09 DIAGNOSIS — Z20822 Contact with and (suspected) exposure to covid-19: Secondary | ICD-10-CM | POA: Insufficient documentation

## 2019-05-09 DIAGNOSIS — R0902 Hypoxemia: Secondary | ICD-10-CM | POA: Diagnosis not present

## 2019-05-09 DIAGNOSIS — R069 Unspecified abnormalities of breathing: Secondary | ICD-10-CM | POA: Diagnosis not present

## 2019-05-09 DIAGNOSIS — J441 Chronic obstructive pulmonary disease with (acute) exacerbation: Secondary | ICD-10-CM | POA: Insufficient documentation

## 2019-05-09 DIAGNOSIS — R0689 Other abnormalities of breathing: Secondary | ICD-10-CM | POA: Diagnosis not present

## 2019-05-09 DIAGNOSIS — F17210 Nicotine dependence, cigarettes, uncomplicated: Secondary | ICD-10-CM | POA: Diagnosis not present

## 2019-05-09 DIAGNOSIS — R062 Wheezing: Secondary | ICD-10-CM | POA: Diagnosis not present

## 2019-05-09 DIAGNOSIS — R0603 Acute respiratory distress: Secondary | ICD-10-CM | POA: Diagnosis present

## 2019-05-09 LAB — CBC WITH DIFFERENTIAL/PLATELET
Abs Immature Granulocytes: 0.04 10*3/uL (ref 0.00–0.07)
Basophils Absolute: 0.1 10*3/uL (ref 0.0–0.1)
Basophils Relative: 1 %
Eosinophils Absolute: 0.4 10*3/uL (ref 0.0–0.5)
Eosinophils Relative: 4 %
HCT: 40.8 % (ref 39.0–52.0)
Hemoglobin: 13.8 g/dL (ref 13.0–17.0)
Immature Granulocytes: 0 %
Lymphocytes Relative: 24 %
Lymphs Abs: 2.3 10*3/uL (ref 0.7–4.0)
MCH: 30.7 pg (ref 26.0–34.0)
MCHC: 33.8 g/dL (ref 30.0–36.0)
MCV: 90.7 fL (ref 80.0–100.0)
Monocytes Absolute: 0.8 10*3/uL (ref 0.1–1.0)
Monocytes Relative: 8 %
Neutro Abs: 6 10*3/uL (ref 1.7–7.7)
Neutrophils Relative %: 63 %
Platelets: 245 10*3/uL (ref 150–400)
RBC: 4.5 MIL/uL (ref 4.22–5.81)
RDW: 12.4 % (ref 11.5–15.5)
WBC: 9.6 10*3/uL (ref 4.0–10.5)
nRBC: 0 % (ref 0.0–0.2)

## 2019-05-09 LAB — COMPREHENSIVE METABOLIC PANEL
ALT: 24 U/L (ref 0–44)
AST: 20 U/L (ref 15–41)
Albumin: 3.7 g/dL (ref 3.5–5.0)
Alkaline Phosphatase: 50 U/L (ref 38–126)
Anion gap: 12 (ref 5–15)
BUN: 6 mg/dL — ABNORMAL LOW (ref 8–23)
CO2: 27 mmol/L (ref 22–32)
Calcium: 9 mg/dL (ref 8.9–10.3)
Chloride: 95 mmol/L — ABNORMAL LOW (ref 98–111)
Creatinine, Ser: 0.75 mg/dL (ref 0.61–1.24)
GFR calc Af Amer: >60 mL/min (ref >60)
GFR calc non Af Amer: >60 mL/min (ref >60)
Glucose, Bld: 132 mg/dL — ABNORMAL HIGH (ref 70–99)
Potassium: 4.1 mmol/L (ref 3.5–5.1)
Sodium: 134 mmol/L — ABNORMAL LOW (ref 135–145)
Total Bilirubin: 0.8 mg/dL (ref 0.3–1.2)
Total Protein: 6.7 g/dL (ref 6.5–8.1)

## 2019-05-09 LAB — POC SARS CORONAVIRUS 2 AG -  ED: SARS Coronavirus 2 Ag: NEGATIVE

## 2019-05-09 MED ORDER — TRAZODONE HCL 50 MG PO TABS: 50.0000 mg | ORAL_TABLET | Freq: Every evening | ORAL | 0 refills | Status: DC | PRN

## 2019-05-09 MED ORDER — MIRTAZAPINE 30 MG PO TABS: 30.0000 mg | ORAL_TABLET | Freq: Every day | ORAL | 0 refills | Status: DC

## 2019-05-09 MED ORDER — RISPERIDONE 4 MG PO TABS: 4.0000 mg | ORAL_TABLET | Freq: Two times a day (BID) | ORAL | 0 refills | Status: DC

## 2019-05-09 MED ORDER — IPRATROPIUM BROMIDE HFA 17 MCG/ACT IN AERS
4.0000 | INHALATION_SPRAY | Freq: Once | RESPIRATORY_TRACT | Status: AC
  Administered 2019-05-09: 4 via RESPIRATORY_TRACT
  Filled 2019-05-09: qty 12.9

## 2019-05-09 MED ORDER — ALBUTEROL SULFATE HFA 108 (90 BASE) MCG/ACT IN AERS
8.0000 | INHALATION_SPRAY | RESPIRATORY_TRACT | Status: DC | PRN
  Administered 2019-05-09: 8 via RESPIRATORY_TRACT
  Filled 2019-05-09: qty 6.7

## 2019-05-09 MED ORDER — AEROCHAMBER PLUS FLO-VU MISC
1.0000 | Freq: Once | Status: AC
  Administered 2019-05-09: 1

## 2019-05-09 MED ORDER — PREDNISONE 20 MG PO TABS: 40.0000 mg | ORAL_TABLET | Freq: Every day | ORAL | 0 refills | Status: DC

## 2019-05-09 MED ORDER — BENZTROPINE MESYLATE 2 MG PO TABS: 2.0000 mg | ORAL_TABLET | Freq: Two times a day (BID) | ORAL | 0 refills | Status: DC

## 2019-05-09 NOTE — Progress Notes (Signed)
Virtual Visit via Telephone Note  I connected with [name] on 05/09/19 at  1:40 PM EST by telephone and verified that I am speaking with the correct person using two identifiers.   I discussed the limitations, risks, security and privacy concerns of performing an evaluation and management service by telephone and the availability of in person appointments. I also discussed with the patient that there may be a patient responsible charge related to this service. The patient expressed understanding and agreed to proceed.   History of Present Illness: Patient was evaluated by phone session.  He is a poor historian and most of the information was obtained from his sister going who is close by.  Apparently patient seen in emergency room today because of shortness of breath.  He has lately difficulty with exacerbation of COPD.  Some nights he has difficulty sleeping but his sister endorse that his mood has been stable.  Sometimes he talks to himself but he is not agitated, angry, having crying spells or any anger issues.  When he is not coughing he sleeps 7 hours every night.  His sister endorsed on time.  Talk to himself and self giggling but compliant with medicine and reported no side effects.  His appetite is okay.  His energy level is fair.  We did talk about reducing the dose of Risperdal but patient and his sister reluctant to change the medication since they are working very well.  Past Psychiatric History:Reviewed. History of schizophrenia and admitted at [facility] years ago. At that time he was very paranoid, delusional, disorganized behavior and having mood swings.   Recent Results (from the past 2160 hour(s))  POC SARS Coronavirus 2 Ag-ED - Nasal Swab (BD Veritor Kit)     Status: None   Collection Time: 02/18/19  2:11 PM  Result Value Ref Range   SARS Coronavirus 2 Ag NEGATIVE NEGATIVE    Comment: (NOTE) SARS-CoV-2 antigen NOT DETECTED.  Negative results are presumptive.   Negative results do not preclude SARS-CoV-2 infection and should not be used as the sole basis for treatment or other patient management decisions, including infection  control decisions, particularly in the presence of clinical signs and  symptoms consistent with COVID-19, or in those who have been in contact with the virus.  Negative results must be combined with clinical observations, patient history, and epidemiological information. The expected result is Negative. Fact Sheet for Patients: PodPark.tn Fact Sheet for Healthcare Providers: GiftContent.is This test is not yet approved or cleared by the [place] FDA and  has been authorized for detection and/or diagnosis of SARS-CoV-2 by FDA under an Emergency Use Authorization (EUA).  This EUA will remain in effect (meaning this test can be used) for the duration of  the COVID-19 de claration under Section 564(b)(1) of the Act, 21 U.S.C. section 360bbb-3(b)(1), unless the authorization is terminated or revoked sooner.   Basic metabolic panel     Status: Abnormal   Collection Time: 02/18/19  2:17 PM  Result Value Ref Range   Sodium 133 (L) 135 - 145 mmol/L   Potassium 4.4 3.5 - 5.1 mmol/L   Chloride 98 98 - 111 mmol/L   CO2 23 22 - 32 mmol/L   Glucose, Bld 109 (H) 70 - 99 mg/dL   BUN <5 (L) 8 - 23 mg/dL   Creatinine, Ser 0.71 0.61 - 1.24 mg/dL   Calcium 9.2 8.9 - 10.3 mg/dL   GFR calc non Af Amer >60 >60 mL/min  GFR calc Af Amer >60 >60 mL/min   Anion gap 12 5 - 15    Comment: Performed at [facility], [address] [place], [place] [address]  CBC with Differential/Platelet     Status: None   Collection Time: 02/18/19  2:17 PM  Result Value Ref Range   WBC 8.2 4.0 - 10.5 K/uL   RBC 4.90 4.22 - 5.81 MIL/uL   Hemoglobin 15.2 13.0 - 17.0 g/dL   HCT 45.9 39.0 - 52.0 %   MCV 93.7 80.0 - 100.0 fL   MCH 31.0 26.0 - 34.0 pg   MCHC 33.1 30.0 - 36.0 g/dL   RDW  12.9 11.5 - 15.5 %   Platelets 247 150 - 400 K/uL   nRBC 0.0 0.0 - 0.2 %   Neutrophils Relative % 67 %   Neutro Abs 5.4 1.7 - 7.7 K/uL   Lymphocytes Relative 20 %   Lymphs Abs 1.7 0.7 - 4.0 K/uL   Monocytes Relative 8 %   Monocytes Absolute 0.7 0.1 - 1.0 K/uL   Eosinophils Relative 4 %   Eosinophils Absolute 0.4 0.0 - 0.5 K/uL   Basophils Relative 1 %   Basophils Absolute 0.1 0.0 - 0.1 K/uL   Immature Granulocytes 0 %   Abs Immature Granulocytes 0.03 0.00 - 0.07 K/uL    Comment: Performed at [facility], [address] [place], [place] [address]  SARS CORONAVIRUS 2 (TAT 6-24 HRS) Nasopharyngeal Nasopharyngeal Swab     Status: None   Collection Time: 02/18/19  2:58 PM   Specimen: Nasopharyngeal Swab  Result Value Ref Range   SARS Coronavirus 2 NEGATIVE NEGATIVE    Comment: (NOTE) SARS-CoV-2 target nucleic acids are NOT DETECTED. The SARS-CoV-2 RNA is generally detectable in upper and lower respiratory specimens during the acute phase of infection. Negative results do not preclude SARS-CoV-2 infection, do not rule out co-infections with other pathogens, and should not be used as the sole basis for treatment or other patient management decisions. Negative results must be combined with clinical observations, patient history, and epidemiological information. The expected result is Negative. Fact Sheet for Patients: https://www.fda.gov/media/138098/download Fact Sheet for Healthcare Providers: https://www.fda.gov/media/138095/download This test is not yet approved or cleared by the [place] FDA and  has been authorized for detection and/or diagnosis of SARS-CoV-2 by FDA under an Emergency Use Authorization (EUA). This EUA will remain  in effect (meaning this test can be used) for the duration of the COVID-19 declaration under Section 56 4(b)(1) of the Act, 21 U.S.C. section 360bbb-3(b)(1), unless the authorization is terminated or revoked sooner. Performed at [facility], [address] [place], [place] [address]   HIV Antibody (routine testing w rflx)     Status: None   Collection Time: 02/19/19  5:05 AM  Result Value Ref Range   HIV Screen 4th Generation wRfx NON REACTIVE NON REACTIVE    Comment: Performed at [facility], [address] [place], [place] [address]  Basic metabolic panel     Status: Abnormal   Collection Time: 02/19/19  5:05 AM  Result Value Ref Range   Sodium 133 (L) 135 - 145 mmol/L   Potassium 4.7 3.5 - 5.1 mmol/L   Chloride 98 98 - 111 mmol/L   CO2 23 22 - 32 mmol/L   Glucose, Bld 125 (H) 70 - 99 mg/dL   BUN <5 (L) 8 - 23 mg/dL   Creatinine, Ser 0.73 0.61 - 1.24 mg/dL     Calcium 9.5 8.9 - 10.3 mg/dL   GFR calc non Af Amer >60 >60 mL/min   GFR calc Af Amer >60 >60 mL/min   Anion gap 12 5 - 15    Comment: Performed at [facility], [address] [place], [place] [address]  CBC     Status: None   Collection Time: 02/19/19  5:05 AM  Result Value Ref Range   WBC 6.3 4.0 - 10.5 K/uL   RBC 5.12 4.22 - 5.81 MIL/uL   Hemoglobin 15.5 13.0 - 17.0 g/dL   HCT 45.9 39.0 - 52.0 %   MCV 89.6 80.0 - 100.0 fL   MCH 30.3 26.0 - 34.0 pg   MCHC 33.8 30.0 - 36.0 g/dL   RDW 12.6 11.5 - 15.5 %   Platelets 243 150 - 400 K/uL   nRBC 0.0 0.0 - 0.2 %    Comment: Performed at [facility], [address] [place], [place] [address]  Lipase, blood     Status: None   Collection Time: 03/14/19  9:33 AM  Result Value Ref Range   Lipase 20 11 - 51 U/L    Comment: Performed at [facility], [address] [place], [place] [address]  Comprehensive metabolic panel     Status: Abnormal   Collection Time: 03/14/19  9:33 AM  Result Value Ref Range   Sodium 138 135 - 145 mmol/L   Potassium 4.2 3.5 - 5.1 mmol/L   Chloride 100 98 - 111 mmol/L   CO2 26 22 - 32 mmol/L   Glucose, Bld 100 (H) 70 - 99 mg/dL   BUN <5 (L) 8 - 23 mg/dL   Creatinine, Ser 0.85 0.61 - 1.24 mg/dL   Calcium 9.6 8.9 - 10.3 mg/dL   Total  Protein 7.0 6.5 - 8.1 g/dL   Albumin 4.0 3.5 - 5.0 g/dL   AST 24 15 - 41 U/L   ALT 38 0 - 44 U/L   Alkaline Phosphatase 47 38 - 126 U/L   Total Bilirubin 0.9 0.3 - 1.2 mg/dL   GFR calc non Af Amer >60 >60 mL/min   GFR calc Af Amer >60 >60 mL/min   Anion gap 12 5 - 15    Comment: Performed at [facility], [address] [place],  [address]  CBC     Status: None   Collection Time: 03/14/19  9:33 AM  Result Value Ref Range   WBC 9.5 4.0 - 10.5 K/uL   RBC 5.10 4.22 - 5.81 MIL/uL   Hemoglobin 15.8 13.0 - 17.0 g/dL   HCT 47.5 39.0 - 52.0 %   MCV 93.1 80.0 - 100.0 fL   MCH 31.0 26.0 - 34.0 pg   MCHC 33.3 30.0 - 36.0 g/dL   RDW 12.9 11.5 - 15.5 %   Platelets 237 150 - 400 K/uL   nRBC 0.0 0.0 - 0.2 %    Comment: Performed at  [facility], [address] ,  [address]  Urinalysis, Routine w reflex microscopic     Status: Abnormal   Collection Time: 03/14/19 11:30 AM  Result Value Ref Range   Color, Urine YELLOW YELLOW   APPearance CLEAR CLEAR   Specific Gravity, Urine 1.012 1.005 - 1.030   pH 7.0 5.0 - 8.0   Glucose, UA NEGATIVE NEGATIVE mg/dL   Hgb urine dipstick NEGATIVE NEGATIVE   Bilirubin Urine NEGATIVE NEGATIVE   Ketones, ur 5 (A) NEGATIVE mg/dL     Protein, ur NEGATIVE NEGATIVE mg/dL   Nitrite NEGATIVE NEGATIVE   Leukocytes,Ua NEGATIVE NEGATIVE    Comment: Performed at [facility], [place] [address], [place], [place] [address]  CBC with Differential/Platelet     Status: None   Collection Time: 05/09/19  9:16 AM  Result Value Ref Range   WBC 9.6 4.0 - 10.5 K/uL   RBC 4.50 4.22 - 5.81 MIL/uL   Hemoglobin 13.8 13.0 - 17.0 g/dL   HCT 40.8 39.0 - 52.0 %   MCV 90.7 80.0 - 100.0 fL   MCH 30.7 26.0 - 34.0 pg   MCHC 33.8 30.0 - 36.0 g/dL   RDW 12.4 11.5 - 15.5 %   Platelets 245 150 - 400 K/uL   nRBC 0.0 0.0 - 0.2 %   Neutrophils Relative % 63 %   Neutro Abs 6.0 1.7 - 7.7 K/uL   Lymphocytes Relative 24 %   Lymphs Abs 2.3 0.7 - 4.0 K/uL    Monocytes Relative 8 %   Monocytes Absolute 0.8 0.1 - 1.0 K/uL   Eosinophils Relative 4 %   Eosinophils Absolute 0.4 0.0 - 0.5 K/uL   Basophils Relative 1 %   Basophils Absolute 0.1 0.0 - 0.1 K/uL   Immature Granulocytes 0 %   Abs Immature Granulocytes 0.04 0.00 - 0.07 K/uL    Comment: Performed at [facility], [address], [place], [place] [address]  Comprehensive metabolic panel     Status: Abnormal   Collection Time: 05/09/19  9:16 AM  Result Value Ref Range   Sodium 134 (L) 135 - 145 mmol/L   Potassium 4.1 3.5 - 5.1 mmol/L   Chloride 95 (L) 98 - 111 mmol/L   CO2 27 22 - 32 mmol/L   Glucose, Bld 132 (H) 70 - 99 mg/dL   BUN 6 (L) 8 - 23 mg/dL   Creatinine, Ser 0.75 0.61 - 1.24 mg/dL   Calcium 9.0 8.9 - 10.3 mg/dL   Total Protein 6.7 6.5 - 8.1 g/dL   Albumin 3.7 3.5 - 5.0 g/dL   AST 20 15 - 41 U/L   ALT 24 0 - 44 U/L   Alkaline Phosphatase 50 38 - 126 U/L   Total Bilirubin 0.8 0.3 - 1.2 mg/dL   GFR calc non Af Amer >60 >60 mL/min   GFR calc Af Amer >60 >60 mL/min   Anion gap 12 5 - 15    Comment: Performed at [facility], [place] [address] [place], [place] [address]  POC SARS Coronavirus 2 Ag-ED - Nasal Swab (BD Veritor Kit)     Status: None   Collection Time: 05/09/19  9:56 AM  Result Value Ref Range   SARS Coronavirus 2 Ag NEGATIVE NEGATIVE    Comment: (NOTE) SARS-CoV-2 antigen NOT DETECTED.  Negative results are presumptive.  Negative results do not preclude SARS-CoV-2 infection and should not be used as the sole basis for treatment or other patient management decisions, including infection  control decisions, particularly in the presence of clinical signs and  symptoms consistent with COVID-19, or in those who have been in contact with the virus.  Negative results must be combined with clinical observations, patient history, and epidemiological information. The expected result is Negative. Fact Sheet for Patients:  PodPark.tn Fact Sheet for Healthcare Providers: GiftContent.is This test is not yet approved or cleared by the [place] FDA and  has been authorized for detection and/or diagnosis of SARS-CoV-2 by FDA under an Emergency Use  Authorization (EUA).  This EUA will remain in effect (meaning this test can be used) for the duration of  the COVID-19 de claration under Section 564(b)(1) of the Act, 21 U.S.C. section 360bbb-3(b)(1), unless the authorization is terminated or revoked sooner.       Psychiatric Specialty Exam: Physical Exam  Review of Systems  There were no vitals taken for this visit.There is no height or weight on file to calculate BMI.  General Appearance: NA  Eye Contact:  NA  Speech:  Slow and sometimes rambling  Volume:  Decreased  Mood:  Euthymic  Affect:  NA  Thought Process:  Descriptions of Associations: Circumstantial  Orientation:  Full (Time, Place, and Person)  Thought Content:  Hallucinations: talk to himself  Suicidal Thoughts:  No  Homicidal Thoughts:  No  Memory:  Immediate;   Fair Recent;   Fair Remote;   Fair  Judgement:  Fair  Insight:  Fair  Psychomotor Activity:  NA  Concentration:  Concentration: Fair and Attention Span: Fair  Recall:  [employer] of Knowledge:  Fair  Language:  Fair  Akathisia:  No  Handed:  Right  AIMS (if indicated):     Assets:  Desire for Improvement Housing Social Support  ADL's:  Intact  Cognition:  Impaired,  Mild  Sleep:   fair      Assessment and Plan: Schizophrenia chronic paranoid type.  Primary insomnia.  I reviewed recent blood work results.  Patient is a stable on his current medication.  Discussed to reduce Risperdal since psychiatric symptoms are stable but patient and his sister reluctant to cut down the dose.  I will continue mirtazapine 30 mg at bedtime, Resporal 4 mg twice a day, Cogentin 2 mg twice a day and trazodone 50 mg at bedtime.   Recommended to call us back if there is any question of any concern.  Follow-up in 3 months.  Follow Up Instructions:    I discussed the assessment and treatment plan with the patient. The patient was provided an opportunity to ask questions and all were answered. The patient agreed with the plan and demonstrated an understanding of the instructions.   The patient was advised to call back or seek an in-person evaluation if the symptoms worsen or if the condition fails to improve as anticipated.  I provided 20 minutes of non-face-to-face time during this encounter.   Kathlee Nations, MD

## 2019-05-09 NOTE — Discharge Instructions (Addendum)
Your blood work today looked good and x-ray showed no signs of pneumonia.  You will need to use either the albuterol inhaler or your nebulizer as needed when you feel short of breath.  Continue to take your Advair daily like you have been doing.  Take your prednisone every day for the next 5 days but if your shortness of breath gets worse or you are having trouble at home please return to the ER.

## 2019-05-09 NOTE — ED Provider Notes (Signed)
[facility] EMERGENCY DEPARTMENT Provider Note   CSN: [id number] Arrival date & time: 05/09/19  [id number]     History Chief Complaint  Patient presents with  . Respiratory Distress    Blake Mcknight is a 64 y.o. male.  Patient is a 64 year old male with a history of schizophrenia, hypertension, COPD intermittently on home oxygen who presents today with EMS for acute respiratory distress.  Patient states that he has been coughing and more short of breath over the last few days but got worse today.  He reports he always coughs up a lot of sputum but does not think that has changed.  He denies any chest pain, abdominal pain, vomiting or fever.  Paramedics report when they arrived patient was using accessory muscles and in acute respiratory distress with oxygen saturation of 85% on room air.  He received IV epi, Solu-Medrol and magnesium in route with a nonrebreather oxygen with improvement in symptoms.  Patient reports still feeling short of breath but does state he feels better than he did.  He reports he no longer smokes cigarettes and denies any drug or alcohol use.  The history is provided by the patient.       Past Medical History:  Diagnosis Date  . COPD (chronic obstructive pulmonary disease) (HCC)    with bullous emphysema.   Marland Kitchen Heavy cigarette smoker before 2003   pt claims only 10 cigs per day, never heavier amounts.   Marland Kitchen HLD (hyperlipidemia)   . Hypertension   . Schizophrenia ([place]) 06/28/2013   This is a chronic condition and he lives with family    Patient Active Problem List   Diagnosis Date Noted  . Do not resuscitate 02/18/2019  . Acute respiratory failure with hypercapnia ([place]) 06/16/2016  . Acute respiratory failure with hypoxia ([place]) 06/16/2016  . Acute on chronic respiratory failure with hypoxia ([place]) 05/25/2016  . Altered mental status 09/22/2015  . Hyponatremia 09/22/2015  . Elevated lactic acid level 09/22/2015  . Acute encephalopathy   .  Bullous emphysema ([place]) 07/26/2013  . Schizophrenia ([place]) 06/28/2013  . Tobacco use disorder 06/06/2013  . Abdominal pain 06/03/2013  . Sepsis ([place]) 06/03/2013  . Elevated LFTs 06/03/2013  . HTN (hypertension) 06/03/2013  . HLD (hyperlipidemia) 06/03/2013  . Chronic obstructive pulmonary disease with acute exacerbation ([place]) 06/03/2013  . Calculus of bile duct without mention of cholecystitis or obstruction 06/03/2013    Past Surgical History:  Procedure Laterality Date  . CHOLECYSTECTOMY N/A 06/06/2013   Procedure: LAPAROSCOPIC CHOLECYSTECTOMY WITH ATTEMPTED INTRAOPERATIVE CHOLANGIOGRAM;  Surgeon: Zenovia Jarred, MD;  Location: [place];  Service: General;  Laterality: N/A;  . ERCP N/A 06/03/2013   Procedure: ENDOSCOPIC RETROGRADE CHOLANGIOPANCREATOGRAPHY (ERCP);  Surgeon: Ladene Artist, MD;  Location: [facility] ENDOSCOPY;  Service: Endoscopy;  Laterality: N/A;  . NO PAST SURGERIES         Family History  Problem Relation Age of Onset  . Diabetes Mellitus II Mother   . Diabetes Mother   . Heart disease Father   . Stroke Maternal Aunt   . CAD Neg Hx     Social History   Tobacco Use  . Smoking status: Current Every Day Smoker    Packs/day: 1.00    Years: 41.00    Pack years: 41.00    Types: Cigarettes  . Smokeless tobacco: Never Used  . Tobacco comment: Gets 4 puffs a day.   Substance Use Topics  . Alcohol use: No  . Drug use: No  Home Medications Prior to Admission medications   Medication Sig Start Date End Date Taking? Authorizing Provider  acetaminophen (TYLENOL) 325 MG tablet Take 2 tablets (650 mg total) by mouth every 6 (six) hours as needed for mild pain, moderate pain, fever or headache (or Fever >/= 101). 06/18/16   Hongalgi, Lenis Dickinson, MD  ADVAIR [facility] 115-21 MCG/ACT inhaler Inhale 1 puff into the lungs 2 (two) times daily. 09/21/15   [provider]  albuterol (PROVENTIL) (2.5 MG/3ML) 0.083% nebulizer solution Take 3 mLs (2.5 mg total) by nebulization every 3  (three) hours as needed for wheezing or shortness of breath. 02/20/19   Aline August, MD  albuterol (VENTOLIN HFA) 108 (90 Base) MCG/ACT inhaler Inhale 2 puffs into the lungs every 4 (four) hours as needed for wheezing or shortness of breath. 02/20/19   Aline August, MD  Ascorbic Acid (VITAMIN C PO) Take 1 tablet by mouth 3 (three) times a week.     [provider]  atorvastatin (LIPITOR) 10 MG tablet Take 10 mg by mouth daily at 6 PM.  09/21/15   [provider]  benztropine (COGENTIN) 2 MG tablet Take 1 tablet (2 mg total) by mouth 2 (two) times daily. 02/07/19   Arfeen, Arlyce Harman, MD  carvedilol (COREG) 3.125 MG tablet Take 1 tablet (3.125 mg total) by mouth 2 (two) times daily with a meal. 02/20/19   Aline August, MD  cetirizine (ZYRTEC) 10 MG tablet Take 10 mg by mouth daily as needed for allergies or rhinitis.    [provider]  Cholecalciferol (VITAMIN D3) 50 MCG (2000 UT) TABS Take 2,000-4,000 Units by mouth daily with breakfast.    [provider]  Cyanocobalamin (B-12 PO) Take 1 tablet by mouth every other day.     [provider]  enalapril (VASOTEC) 10 MG tablet Take 10 mg by mouth daily. 06/04/16   [provider]  famotidine (PEPCID) 40 MG tablet Take 40 mg by mouth 2 (two) times daily as needed for heartburn or indigestion.  12/28/18   [provider]  guaiFENesin-dextromethorphan (ROBITUSSIN DM) 100-10 MG/5ML syrup Take 10 mLs by mouth every 4 (four) hours as needed for cough. 02/20/19   Aline August, MD  ibuprofen (ADVIL) 200 MG tablet Take 200 mg by mouth every 6 (six) hours as needed for fever.    [provider]  mirtazapine (REMERON) 30 MG tablet Take 1 tablet (30 mg total) by mouth at bedtime. 02/07/19 02/07/20  Arfeen, Arlyce Harman, MD  multivitamin (ONE-A-DAY MEN'S) TABS tablet Take 1 tablet by mouth daily.    [provider]  nicotine (NICODERM CQ - DOSED IN MG/24 HOURS) 14 mg/24hr patch Place 1 patch  (14 mg total) onto the skin daily. 02/20/19   Aline August, MD  omeprazole (PRILOSEC) 20 MG capsule Take 1 capsule (20 mg total) by mouth daily. 03/14/19   Domenic Moras, PA-C  OXYGEN Inhale 2 L/min into the lungs continuous.     [provider]  risperidone (RISPERDAL) 4 MG tablet Take 1 tablet (4 mg total) by mouth 2 (two) times daily. 02/07/19   Arfeen, Arlyce Harman, MD  traZODone (DESYREL) 50 MG tablet Take 1 tablet (50 mg total) by mouth at bedtime as needed for sleep. 02/07/19   Arfeen, Arlyce Harman, MD    Allergies    Patient has no known allergies.  Review of Systems   Review of Systems  All other systems reviewed and are negative.   Physical Exam Updated Vital  Signs BP 112/80 (BP Location: Right Arm)   Pulse 100   Temp 97.8 F (36.6 C) (Oral)   Resp 20   Ht 5\' 5"  (1.651 m)   Wt 72.1 kg   SpO2 100%   BMI 26.46 kg/m   Physical Exam Vitals and nursing note reviewed.  Constitutional:      General: He is not in acute distress.    Appearance: Normal appearance. He is well-developed and normal weight.  HENT:     Head: Normocephalic and atraumatic.     Mouth/Throat:     Mouth: Mucous membranes are moist.  Eyes:     Conjunctiva/sclera: Conjunctivae normal.     Pupils: Pupils are equal, round, and reactive to light.  Cardiovascular:     Rate and Rhythm: Regular rhythm. Tachycardia present.     Heart sounds: No murmur.  Pulmonary:     Effort: Pulmonary effort is normal. No respiratory distress.     Breath sounds: Wheezing present. No rales.  Abdominal:     General: There is no distension.     Palpations: Abdomen is soft.     Tenderness: There is no abdominal tenderness. There is no guarding or rebound.  Musculoskeletal:        General: No tenderness. Normal range of motion.     Cervical back: Normal range of motion and neck supple.     Right lower leg: No edema.     Left lower leg: No edema.  Skin:    General: Skin is warm and dry.     Findings: No erythema or rash.   Neurological:     General: No focal deficit present.     Mental Status: He is alert and oriented to person, place, and time. Mental status is at baseline.  Psychiatric:        Behavior: Behavior normal.     Comments: Calm and cooperative     ED Results / Procedures / Treatments   Labs (all labs ordered are listed, but only abnormal results are displayed) Labs Reviewed  COMPREHENSIVE METABOLIC PANEL - Abnormal; Notable for the following components:      Result Value   Sodium 134 (*)    Chloride 95 (*)    Glucose, Bld 132 (*)    BUN 6 (*)    All other components within normal limits  CBC WITH DIFFERENTIAL/PLATELET  POC SARS CORONAVIRUS 2 AG -  ED    EKG EKG Interpretation  Date/Time:  Monday May 09 2019 09:23:20 EST Ventricular Rate:  99 PR Interval:    QRS Duration: 87 QT Interval:  325 QTC Calculation: 417 R Axis:   98 Text Interpretation: Sinus rhythm Anteroseptal infarct, age indeterminate Baseline wander in lead(s) V2 V5 No significant change since last tracing Confirmed by Blanchie Dessert [phone]) on 05/09/2019 9:35:57 AM   Radiology DG Chest Port 1 View  Result Date: 05/09/2019 CLINICAL DATA:  Shortness of breath EXAM: PORTABLE CHEST 1 VIEW COMPARISON:  02/18/2019 FINDINGS: The heart size and mediastinal contours are within normal limits. Severe emphysematous lung changes bilaterally without superimposed focal airspace consolidation. No pleural effusion or pneumothorax. The visualized skeletal structures are unremarkable. IMPRESSION: Severe emphysema without superimposed acute airspace consolidation. Electronically Signed   By: Davina Poke D.O.   On: 05/09/2019 09:53    Procedures Procedures (including critical care time)  Medications Ordered in ED Medications  albuterol (VENTOLIN HFA) 108 (90 Base) MCG/ACT inhaler 8 puff (8 puffs Inhalation Given 05/09/19 0924)  ipratropium (ATROVENT HFA) inhaler  4 puff (4 puffs Inhalation Given 05/09/19 0924)    aerochamber plus with mask device 1 each (1 each Other Given 05/09/19 [id number])    ED Course  I have reviewed the triage vital signs and the nursing notes.  Pertinent labs & imaging results that were available during my care of the patient were reviewed by me and considered in my medical decision making (see chart for details).    MDM Rules/Calculators/A&P                      64 year old male presenting today after acute respiratory distress.  Patient has received Solu-Medrol, magnesium, epi and currently is wheezing but not in respiratory distress any longer.  Will give albuterol and Atrovent and continue to monitor.  Low suspicion for PE at this time.  Patient has no evidence of fluid overload suggestive of CHF.  Low suspicion for dissection.  Suspect this is a COPD exacerbation.  Patient does not have Covid-like symptoms and point-of-care Covid is negative.  Labs including CBC and CMP without acute findings.  EKG with no acute findings.  Chest x-ray with severe COPD but no pneumothorax or other acute findings.  Will reevaluate after inhalers are given.  12:24 PM On repeat evaluation wheezing is now under control.  Patient is breathing much more comfortably.  Sister is at bedside and reports they have all the things at home they need.  Will discharge home with the inhalers and spacer as well as a prescription for prednisone.  Final Clinical Impression(s) / ED Diagnoses Final diagnoses:  COPD exacerbation ([place])    Rx / DC Orders ED Discharge Orders         Ordered    predniSONE (DELTASONE) 20 MG tablet  Daily     05/09/19 1225           Blanchie Dessert, MD 05/09/19 1228

## 2019-05-09 NOTE — ED Triage Notes (Signed)
Per [facility] pt coming from home for respiratory distress onset of 8am. On EMS arrival pt was self administering nebulizer treatment and initial sats 85%. Pt given epi IM, 125 solumedrol, and 2mg  magnesium.

## 2019-06-05 ENCOUNTER — Emergency Department (HOSPITAL_COMMUNITY): Payer: Medicare Other

## 2019-06-05 ENCOUNTER — Inpatient Hospital Stay (HOSPITAL_COMMUNITY)
Admission: EM | Admit: 2019-06-05 | Discharge: 2019-06-07 | DRG: 190 | Disposition: A | Discharge status: Home / Self Care | Payer: Medicare Other | Attending: Internal Medicine | Admitting: Internal Medicine

## 2019-06-05 DIAGNOSIS — R4702 Dysphasia: Secondary | ICD-10-CM | POA: Diagnosis not present

## 2019-06-05 DIAGNOSIS — I10 Essential (primary) hypertension: Secondary | ICD-10-CM | POA: Diagnosis present

## 2019-06-05 DIAGNOSIS — Z20822 Contact with and (suspected) exposure to covid-19: Secondary | ICD-10-CM | POA: Diagnosis present

## 2019-06-05 DIAGNOSIS — Z8249 Family history of ischemic heart disease and other diseases of the circulatory system: Secondary | ICD-10-CM

## 2019-06-05 DIAGNOSIS — J9621 Acute and chronic respiratory failure with hypoxia: Secondary | ICD-10-CM | POA: Diagnosis present

## 2019-06-05 DIAGNOSIS — E872 Acidosis: Secondary | ICD-10-CM | POA: Diagnosis not present

## 2019-06-05 DIAGNOSIS — J441 Chronic obstructive pulmonary disease with (acute) exacerbation: Secondary | ICD-10-CM

## 2019-06-05 DIAGNOSIS — Z823 Family history of stroke: Secondary | ICD-10-CM

## 2019-06-05 DIAGNOSIS — G4700 Insomnia, unspecified: Secondary | ICD-10-CM | POA: Diagnosis present

## 2019-06-05 DIAGNOSIS — Z87891 Personal history of nicotine dependence: Secondary | ICD-10-CM

## 2019-06-05 DIAGNOSIS — R000 Tachycardia, unspecified: Secondary | ICD-10-CM | POA: Diagnosis not present

## 2019-06-05 DIAGNOSIS — R0602 Shortness of breath: Secondary | ICD-10-CM | POA: Diagnosis not present

## 2019-06-05 DIAGNOSIS — F209 Schizophrenia, unspecified: Secondary | ICD-10-CM | POA: Diagnosis present

## 2019-06-05 DIAGNOSIS — J438 Other emphysema: Principal | ICD-10-CM | POA: Diagnosis present

## 2019-06-05 DIAGNOSIS — Z79899 Other long term (current) drug therapy: Secondary | ICD-10-CM

## 2019-06-05 DIAGNOSIS — R0689 Other abnormalities of breathing: Secondary | ICD-10-CM | POA: Diagnosis not present

## 2019-06-05 DIAGNOSIS — Z7951 Long term (current) use of inhaled steroids: Secondary | ICD-10-CM

## 2019-06-05 DIAGNOSIS — Z9981 Dependence on supplemental oxygen: Secondary | ICD-10-CM

## 2019-06-05 DIAGNOSIS — Z833 Family history of diabetes mellitus: Secondary | ICD-10-CM

## 2019-06-05 DIAGNOSIS — R062 Wheezing: Secondary | ICD-10-CM | POA: Diagnosis not present

## 2019-06-05 LAB — PROTIME-INR
INR: 0.9 (ref 0.8–1.2)
Prothrombin Time: 12.4 seconds (ref 11.4–15.2)

## 2019-06-05 LAB — COMPREHENSIVE METABOLIC PANEL
ALT: 22 U/L (ref 0–44)
AST: 18 U/L (ref 15–41)
Albumin: 3.8 g/dL (ref 3.5–5.0)
Alkaline Phosphatase: 49 U/L (ref 38–126)
Anion gap: 11 (ref 5–15)
BUN: 6 mg/dL — ABNORMAL LOW (ref 8–23)
CO2: 25 mmol/L (ref 22–32)
Calcium: 9.3 mg/dL (ref 8.9–10.3)
Chloride: 99 mmol/L (ref 98–111)
Creatinine, Ser: 0.63 mg/dL (ref 0.61–1.24)
GFR calc Af Amer: >60 mL/min (ref >60)
GFR calc non Af Amer: >60 mL/min (ref >60)
Glucose, Bld: 133 mg/dL — ABNORMAL HIGH (ref 70–99)
Potassium: 4.3 mmol/L (ref 3.5–5.1)
Sodium: 135 mmol/L (ref 135–145)
Total Bilirubin: 0.7 mg/dL (ref 0.3–1.2)
Total Protein: 6.6 g/dL (ref 6.5–8.1)

## 2019-06-05 LAB — CBC WITH DIFFERENTIAL/PLATELET
Abs Immature Granulocytes: 0.02 10*3/uL (ref 0.00–0.07)
Basophils Absolute: 0.1 10*3/uL (ref 0.0–0.1)
Basophils Relative: 1 %
Eosinophils Absolute: 0.8 10*3/uL — ABNORMAL HIGH (ref 0.0–0.5)
Eosinophils Relative: 8 %
HCT: 42.6 % (ref 39.0–52.0)
Hemoglobin: 14.6 g/dL (ref 13.0–17.0)
Immature Granulocytes: 0 %
Lymphocytes Relative: 26 %
Lymphs Abs: 2.6 10*3/uL (ref 0.7–4.0)
MCH: 31.3 pg (ref 26.0–34.0)
MCHC: 34.3 g/dL (ref 30.0–36.0)
MCV: 91.2 fL (ref 80.0–100.0)
Monocytes Absolute: 0.9 10*3/uL (ref 0.1–1.0)
Monocytes Relative: 9 %
Neutro Abs: 5.8 10*3/uL (ref 1.7–7.7)
Neutrophils Relative %: 56 %
Platelets: 254 10*3/uL (ref 150–400)
RBC: 4.67 MIL/uL (ref 4.22–5.81)
RDW: 12.2 % (ref 11.5–15.5)
WBC: 10.2 10*3/uL (ref 4.0–10.5)
nRBC: 0 % (ref 0.0–0.2)

## 2019-06-05 LAB — RESPIRATORY PANEL BY RT PCR (FLU A&B, COVID)
Influenza A by PCR: NEGATIVE
Influenza B by PCR: NEGATIVE
SARS Coronavirus 2 by RT PCR: NEGATIVE

## 2019-06-05 LAB — LACTIC ACID, PLASMA
Lactic Acid, Venous: 2.5 mmol/L (ref 0.5–1.9)
Lactic Acid, Venous: 2.2 mmol/L (ref 0.5–1.9)

## 2019-06-05 LAB — TROPONIN I (HIGH SENSITIVITY)
Troponin I (High Sensitivity): 3 ng/L (ref <18)
Troponin I (High Sensitivity): <2 ng/L (ref <18)

## 2019-06-05 LAB — BRAIN NATRIURETIC PEPTIDE: B Natriuretic Peptide: 11.7 pg/mL (ref 0.0–100.0)

## 2019-06-05 MED ORDER — POLYETHYLENE GLYCOL 3350 17 G PO PACK: 17.0000 g | PACK | Freq: Every day | ORAL | Status: DC | PRN

## 2019-06-05 MED ORDER — SODIUM CHLORIDE 0.9 % IV BOLUS
500.0000 mL | Freq: Once | INTRAVENOUS | Status: AC
  Administered 2019-06-05: 500 mL via INTRAVENOUS

## 2019-06-05 MED ORDER — BENZTROPINE MESYLATE 1 MG PO TABS
2.0000 mg | ORAL_TABLET | Freq: Two times a day (BID) | ORAL | Status: DC
  Administered 2019-06-05 – 2019-06-07 (×4): 2 mg via ORAL
  Filled 2019-06-05 (×3): qty 2

## 2019-06-05 MED ORDER — MIRTAZAPINE 30 MG PO TABS
30.0000 mg | ORAL_TABLET | Freq: Every day | ORAL | Status: DC
  Administered 2019-06-05 – 2019-06-06 (×2): 30 mg via ORAL
  Filled 2019-06-05: qty 1
  Filled 2019-06-05: qty 2
  Filled 2019-06-05 (×2): qty 1

## 2019-06-05 MED ORDER — IPRATROPIUM-ALBUTEROL 0.5-2.5 (3) MG/3ML IN SOLN
RESPIRATORY_TRACT | Status: AC
  Administered 2019-06-05: 3 mL via RESPIRATORY_TRACT
  Filled 2019-06-05: qty 3

## 2019-06-05 MED ORDER — SODIUM CHLORIDE 0.9 % IV SOLN
1.0000 g | Freq: Once | INTRAVENOUS | Status: AC
  Administered 2019-06-05: 1 g via INTRAVENOUS
  Filled 2019-06-05: qty 10

## 2019-06-05 MED ORDER — IPRATROPIUM-ALBUTEROL 0.5-2.5 (3) MG/3ML IN SOLN
3.0000 mL | RESPIRATORY_TRACT | Status: DC | PRN
  Administered 2019-06-05 – 2019-06-07 (×7): 3 mL via RESPIRATORY_TRACT
  Filled 2019-06-05 (×7): qty 3

## 2019-06-05 MED ORDER — CARVEDILOL 3.125 MG PO TABS
3.1250 mg | ORAL_TABLET | Freq: Two times a day (BID) | ORAL | Status: DC
  Administered 2019-06-05 – 2019-06-07 (×4): 3.125 mg via ORAL
  Filled 2019-06-05 (×5): qty 1

## 2019-06-05 MED ORDER — ACETAMINOPHEN 325 MG PO TABS
650.0000 mg | ORAL_TABLET | Freq: Four times a day (QID) | ORAL | Status: DC | PRN
  Administered 2019-06-06: 650 mg via ORAL
  Filled 2019-06-05: qty 2

## 2019-06-05 MED ORDER — B-12 250 MCG PO TABS: ORAL_TABLET | ORAL | Status: DC

## 2019-06-05 MED ORDER — SODIUM CHLORIDE 0.9 % IV SOLN
500.0000 mg | Freq: Once | INTRAVENOUS | Status: AC
  Administered 2019-06-05: 500 mg via INTRAVENOUS
  Filled 2019-06-05: qty 500

## 2019-06-05 MED ORDER — FAMOTIDINE 20 MG PO TABS
40.0000 mg | ORAL_TABLET | Freq: Two times a day (BID) | ORAL | Status: DC
  Administered 2019-06-05 – 2019-06-07 (×4): 40 mg via ORAL
  Filled 2019-06-05 (×4): qty 2

## 2019-06-05 MED ORDER — ATORVASTATIN CALCIUM 10 MG PO TABS
10.0000 mg | ORAL_TABLET | Freq: Every day | ORAL | Status: DC
  Administered 2019-06-06: 10 mg via ORAL
  Filled 2019-06-05: qty 1

## 2019-06-05 MED ORDER — RISPERIDONE 2 MG PO TABS
4.0000 mg | ORAL_TABLET | Freq: Two times a day (BID) | ORAL | Status: DC
  Administered 2019-06-05 – 2019-06-07 (×4): 4 mg via ORAL
  Filled 2019-06-05 (×6): qty 2

## 2019-06-05 MED ORDER — ENALAPRIL MALEATE 5 MG PO TABS
10.0000 mg | ORAL_TABLET | Freq: Every day | ORAL | Status: DC
  Administered 2019-06-05 – 2019-06-07 (×3): 10 mg via ORAL
  Filled 2019-06-05 (×4): qty 2

## 2019-06-05 MED ORDER — TRAZODONE HCL 50 MG PO TABS: 50.0000 mg | ORAL_TABLET | Freq: Every evening | ORAL | Status: DC | PRN

## 2019-06-05 MED ORDER — VITAMIN B-12 100 MCG PO TABS
100.0000 ug | ORAL_TABLET | ORAL | Status: DC
  Administered 2019-06-06: 100 ug via ORAL
  Filled 2019-06-05: qty 1

## 2019-06-05 MED ORDER — ALBUTEROL SULFATE HFA 108 (90 BASE) MCG/ACT IN AERS
INHALATION_SPRAY | RESPIRATORY_TRACT | Status: AC
  Filled 2019-06-05: qty 6.7

## 2019-06-05 MED ORDER — ENOXAPARIN SODIUM 40 MG/0.4ML ~~LOC~~ SOLN
40.0000 mg | SUBCUTANEOUS | Status: DC
  Administered 2019-06-06: 40 mg via SUBCUTANEOUS
  Filled 2019-06-05 (×2): qty 0.4

## 2019-06-05 MED ORDER — PREDNISONE 20 MG PO TABS
40.0000 mg | ORAL_TABLET | Freq: Every day | ORAL | Status: DC
  Administered 2019-06-06 – 2019-06-07 (×2): 40 mg via ORAL
  Filled 2019-06-05 (×2): qty 2

## 2019-06-05 NOTE — Plan of Care (Signed)

## 2019-06-05 NOTE — ED Notes (Signed)
This RN requested this pts medications be verified by pharmacy.

## 2019-06-05 NOTE — ED Triage Notes (Signed)
Pt from home via EMS for SOB x several weeks, worsening. Pt with hx of COPD on 2L [place] PRN. Gave himself 5 nebulizer treatments with minimal improvement. Sats maintaining in low 90s for EMS on 2L. Given 2g magnesium IV, 125mg  solumedrol, 1mg  atrovent and 10mg  albuterol en route. Pt appears visibly distressed and tachypenic during triage.

## 2019-06-05 NOTE — ED Notes (Signed)
Pt very agitated, pulling off monitoring devices. Talked to [place], on call admitting, and let her know that pt was not allowing me to monitor and getting very agitated. Also complaining of headache which tylenol PO was ordered for. Per Roselyn Reef, attempted to give po Trazadone early when verified by pharmacy.

## 2019-06-05 NOTE — Progress Notes (Signed)
Called to patient's bedside for administration of PRN Duoneb.  Patient received dyspneic with upper airway wheezing and pulmonary rhonchi.  Education provided on pursed lip breathing.  Patient implemented technique and displayed ability to follow instructions.  Forced expiratory grunting noted.  HHN given with no change in upper airway wheezes.

## 2019-06-05 NOTE — H&P (Signed)
Date: 06/05/2019               Patient Name:  Blake Mcknight MRN: [id number]  DOB: [birth year] Age / Sex: 64 y.o., male   PCP: [name], MD         Medical Service: Internal Medicine Teaching Service         Attending Physician: Dr. Lucious Groves, DO    First Contact: Dr. Darrick Meigs Pager: [phone]  Second Contact: Dr. Sharon Seller Pager: [phone]       After Hours (After 5p/  First Contact Pager: [phone]  weekends / holidays): Second Contact Pager: [phone]   Chief Complaint: shortness of breath  History of Present Illness:  Blake Mcknight. Blake Mcknight is a 64yo male with PMH COPD on 2L, tobacco use, and schizophrenia presenting with sudden onset shortness of breath that is increased from baseline. He states the last few days he has been more short of breath than usual. He is chronically SOB with chronic cough, the cough is unchanged and is productive for yellow sputum. He uses nebulizer at home and this was not helping.  This morning he called EMS and had initial saturations of 85%. He was given epi IM, 125 solumedrol and 2 mg magnesium with improvement in breathing.  He denies recent illness, fever, chills, sick contacts, abdominal pain, nausea, changes in BM or urination, or lower extremity edema. He denies sore throat or upper congestion.  He states he is usually given prednisone which helped return his breathing to normal. He is not sure which inhalers he uses.  He lives at home with his sister. Attempted contact but no answer on cell or home phone.   In the ED he had a lactic acidosis of 2.5 with otherwise unremarkable lab work-up including normal BNP, troponin, leukocytosis. He was afebrile, HR 102, O2 sats 96 on 2L.   Social:   [place]  He used to smoke but states he stopped smoking several years ago. He endorses one year of smoking history but per chart review patient was a heavy smoker at least in 2003.  He denies alcohol or  recreational drug use.   Family History:   Family History  Problem Relation Age of Onset  . Diabetes Mellitus II Mother   . Diabetes Mother   . Heart disease Father   . Stroke Maternal Aunt   . CAD Neg Hx      Meds:  No outpatient medications have been marked as taking for the 06/05/19 encounter  [facility] Encounter).     Allergies: Allergies as of 06/05/2019  . (No Known Allergies)   Past Medical History:  Diagnosis Date  . COPD (chronic obstructive pulmonary disease) (HCC)    with bullous emphysema.   Marland Kitchen Heavy cigarette smoker before 2003   pt claims only 10 cigs per day, never heavier amounts.   Marland Kitchen HLD (hyperlipidemia)   . Hypertension   . Schizophrenia ([place]) 06/28/2013   This is a chronic condition and he lives with family     Review of Systems: A complete ROS was negative except as per HPI.   Physical Exam: Blood pressure 125/77, pulse 94, resp. rate 18, SpO2 99 %.  Constitution: NAD, sitting up in bed Cardio: RRR, no m/r/g, no LE edema, no JVD Respiratory: diffuse wheezing, right>left, no rales or rhonchi, on 2L [place] Abdominal: NTTP, soft, non-distended MSK: moving all extremities, strength symmetrical Neuro: alert and oriented, abnormal affect Skin: c/d/i  EKG: personally reviewed my interpretation is sinus tachycardia with baseline wander.   CXR: personally reviewed my interpretation is enlarged lung volumes, diffuse emphysematous changes similar to prior with increased flattening of the diaphragm  Assessment & Plan by Problem: Active Problems:   COPD exacerbation (HCC)  COPD Exacerbation Diffuse wheezing with increase in oxygen requirement on hospital arrival. No signs of acute infection or other etiology. Significantly improved with nebulizers and steroids.   - prednisone 40 mg qd for five days - duonebs q8h - wean oxygen for O2 saturation goal >89%  Schizophrenia Insomnia Sees Dr. Adele Schilder, last seen 04/2019. Cont. remeron, Risperdal, and  cogentin.   HTN Cont. Coreg.  He states he does not take vasotec. Will hold for now with normal blood pressures   Diet: heart healthy VTE: lovenox IVF: none Code: no chest compressions, otherwise full scope of care with intubation  Dispo: Admit patient to Observation with expected length of stay less than 2 midnights.  SignedMarty Heck, DO 06/05/2019, 4:42 PM  Pager: [phone]

## 2019-06-05 NOTE — ED Provider Notes (Signed)
[facility] EMERGENCY DEPARTMENT Provider Note   CSN: [id number] Arrival date & time: 06/05/19  1346     History Chief Complaint  Patient presents with  . COPD    SOCORRO GARBERS is a 64 y.o. male.  64 year old male with prior medical history as detailed below presents for evaluation of shortness of breath.  Patient with lungs and history of COPD.  EMS reports that they were called for worsening shortness of breath.  Upon EMS initial evaluation the patient was visibly tachypneic and in distress.  He has received Solu-Medrol, magnesium, and albuterol on the way to the ED.  Upon arrival he appears to be somewhat improved.  He denies associated fever.  He denies chest pain.  Patient reports that his last admission for similar symptoms was approximately 2 months ago.  The history is provided by the patient and medical records.  COPD This is a recurrent problem. The current episode started yesterday. The problem occurs constantly. The problem has not changed since onset.Associated symptoms include shortness of breath. Pertinent negatives include no chest pain. Nothing aggravates the symptoms. Nothing relieves the symptoms.       Past Medical History:  Diagnosis Date  . COPD (chronic obstructive pulmonary disease) (HCC)    with bullous emphysema.   Marland Kitchen Heavy cigarette smoker before 2003   pt claims only 10 cigs per day, never heavier amounts.   Marland Kitchen HLD (hyperlipidemia)   . Hypertension   . Schizophrenia ([place]) 06/28/2013   This is a chronic condition and he lives with family    Patient Active Problem List   Diagnosis Date Noted  . Do not resuscitate 02/18/2019  . Acute respiratory failure with hypercapnia ([place]) 06/16/2016  . Acute respiratory failure with hypoxia ([place]) 06/16/2016  . Acute on chronic respiratory failure with hypoxia ([place]) 05/25/2016  . Altered mental status 09/22/2015  . Hyponatremia 09/22/2015  . Elevated lactic acid level 09/22/2015  . Acute  encephalopathy   . Bullous emphysema ([place]) 07/26/2013  . Schizophrenia ([place]) 06/28/2013  . Tobacco use disorder 06/06/2013  . Abdominal pain 06/03/2013  . Sepsis ([place]) 06/03/2013  . Elevated LFTs 06/03/2013  . HTN (hypertension) 06/03/2013  . HLD (hyperlipidemia) 06/03/2013  . Chronic obstructive pulmonary disease with acute exacerbation ([place]) 06/03/2013  . Calculus of bile duct without mention of cholecystitis or obstruction 06/03/2013    Past Surgical History:  Procedure Laterality Date  . CHOLECYSTECTOMY N/A 06/06/2013   Procedure: LAPAROSCOPIC CHOLECYSTECTOMY WITH ATTEMPTED INTRAOPERATIVE CHOLANGIOGRAM;  Surgeon: Zenovia Jarred, MD;  Location: [facility];  Service: General;  Laterality: N/A;  . ERCP N/A 06/03/2013   Procedure: ENDOSCOPIC RETROGRADE CHOLANGIOPANCREATOGRAPHY (ERCP);  Surgeon: Ladene Artist, MD;  Location: [facility] ENDOSCOPY;  Service: Endoscopy;  Laterality: N/A;  . NO PAST SURGERIES         Family History  Problem Relation Age of Onset  . Diabetes Mellitus II Mother   . Diabetes Mother   . Heart disease Father   . Stroke Maternal Aunt   . CAD Neg Hx     Social History   Tobacco Use  . Smoking status: Current Every Day Smoker    Packs/day: 1.00    Years: 41.00    Pack years: 41.00    Types: Cigarettes  . Smokeless tobacco: Never Used  . Tobacco comment: Gets 4 puffs a day.   Substance Use Topics  . Alcohol use: No  . Drug use: No    Home Medications Prior to Admission medications  Medication Sig Start Date End Date Taking? Authorizing Provider  acetaminophen (TYLENOL) 325 MG tablet Take 2 tablets (650 mg total) by mouth every 6 (six) hours as needed for mild pain, moderate pain, fever or headache (or Fever >/= 101). 06/18/16   Hongalgi, Lenis Dickinson, MD  ADVAIR [facility] 115-21 MCG/ACT inhaler Inhale 1 puff into the lungs 2 (two) times daily. 09/21/15   [provider]  albuterol (PROVENTIL) (2.5 MG/3ML) 0.083% nebulizer solution Take 3 mLs (2.5 mg total) by  nebulization every 3 (three) hours as needed for wheezing or shortness of breath. 02/20/19   Aline August, MD  albuterol (VENTOLIN HFA) 108 (90 Base) MCG/ACT inhaler Inhale 2 puffs into the lungs every 4 (four) hours as needed for wheezing or shortness of breath. 02/20/19   Aline August, MD  Ascorbic Acid (VITAMIN C PO) Take 1 tablet by mouth 3 (three) times a week.     [provider]  atorvastatin (LIPITOR) 10 MG tablet Take 10 mg by mouth daily at 6 PM.  09/21/15   [provider]  benztropine (COGENTIN) 2 MG tablet Take 1 tablet (2 mg total) by mouth 2 (two) times daily. 05/09/19   Arfeen, Arlyce Harman, MD  carvedilol (COREG) 3.125 MG tablet Take 1 tablet (3.125 mg total) by mouth 2 (two) times daily with a meal. Patient taking differently: Take 3.125 mg by mouth every morning.  02/20/19   Aline August, MD  cetirizine (ZYRTEC) 10 MG tablet Take 10 mg by mouth daily as needed for allergies or rhinitis.    [provider]  chlorpheniramine-HYDROcodone (TUSSIONEX) 10-8 MG/5ML SUER Take 5 mLs by mouth daily as needed. 03/22/19   [provider]  cholecalciferol (VITAMIN D) 25 MCG (1000 UNIT) tablet Take 4,000 Units by mouth every evening.     [provider]  Cyanocobalamin (B-12 PO) Take 1 tablet by mouth every other day.     [provider]  enalapril (VASOTEC) 10 MG tablet Take 10 mg by mouth daily. 06/04/16   [provider]  famotidine (PEPCID) 40 MG tablet Take 40 mg by mouth 2 (two) times daily.  12/28/18   [provider]  guaiFENesin-dextromethorphan (ROBITUSSIN DM) 100-10 MG/5ML syrup Take 10 mLs by mouth every 4 (four) hours as needed for cough. 02/20/19   Aline August, MD  ibuprofen (ADVIL) 200 MG tablet Take 200-400 mg by mouth every 6 (six) hours as needed for fever.     [provider]  mirtazapine (REMERON) 30 MG tablet Take 1 tablet (30 mg total) by mouth at bedtime. 05/09/19 05/08/20  Arfeen, Arlyce Harman, MD    multivitamin (ONE-A-DAY MEN'S) TABS tablet Take 1 tablet by mouth daily.    [provider]  nicotine (NICODERM CQ - DOSED IN MG/24 HOURS) 14 mg/24hr patch Place 1 patch (14 mg total) onto the skin daily. 02/20/19   Aline August, MD  omeprazole (PRILOSEC) 20 MG capsule Take 1 capsule (20 mg total) by mouth daily. 03/14/19   Domenic Moras, PA-C  OXYGEN Inhale 2 L/min into the lungs continuous.     [provider]  polyethylene glycol (MIRALAX / GLYCOLAX) 17 g packet Take 17 g by mouth daily as needed for mild constipation.    [provider]  predniSONE (DELTASONE) 20 MG tablet Take 2 tablets (40 mg total) by mouth daily. 05/09/19   Blanchie Dessert, MD  risperidone (RISPERDAL) 4 MG tablet Take 1 tablet (4 mg total) by mouth 2 (two) times daily. 05/09/19  Arfeen, Arlyce Harman, MD  traZODone (DESYREL) 50 MG tablet Take 1 tablet (50 mg total) by mouth at bedtime as needed for sleep. 05/09/19   Arfeen, Arlyce Harman, MD    Allergies    Patient has no known allergies.  Review of Systems   Review of Systems  Respiratory: Positive for shortness of breath.   Cardiovascular: Negative for chest pain.  All other systems reviewed and are negative.   Physical Exam Updated Vital Signs There were no vitals taken for this visit.  Physical Exam Vitals and nursing note reviewed.  Constitutional:      General: He is not in acute distress.    Appearance: Normal appearance. He is well-developed.  HENT:     Head: Normocephalic and atraumatic.  Eyes:     Conjunctiva/sclera: Conjunctivae normal.     Pupils: Pupils are equal, round, and reactive to light.  Cardiovascular:     Rate and Rhythm: Normal rate and regular rhythm.     Heart sounds: Normal heart sounds.  Pulmonary:     Effort: Pulmonary effort is normal. No respiratory distress.     Comments: Diffuse expiratory wheezes in all lung fields Abdominal:     General: There is no distension.     Palpations: Abdomen is soft.      Tenderness: There is no abdominal tenderness.  Musculoskeletal:        General: No deformity. Normal range of motion.     Cervical back: Normal range of motion and neck supple.  Skin:    General: Skin is warm and dry.  Neurological:     Mental Status: He is alert and oriented to person, place, and time.     ED Results / Procedures / Treatments   Labs (all labs ordered are listed, but only abnormal results are displayed) Labs Reviewed  COMPREHENSIVE METABOLIC PANEL - Abnormal; Notable for the following components:      Result Value   Glucose, Bld 133 (*)    BUN 6 (*)    All other components within normal limits  CBC WITH DIFFERENTIAL/PLATELET - Abnormal; Notable for the following components:   Eosinophils Absolute 0.8 (*)    All other components within normal limits  LACTIC ACID, PLASMA - Abnormal; Notable for the following components:   Lactic Acid, Venous 2.5 (*)    All other components within normal limits  CULTURE, BLOOD (ROUTINE X 2)  CULTURE, BLOOD (ROUTINE X 2)  RESPIRATORY PANEL BY RT PCR (FLU A&B, COVID)  PROTIME-INR  BRAIN NATRIURETIC PEPTIDE  LACTIC ACID, PLASMA  TROPONIN I (HIGH SENSITIVITY)  TROPONIN I (HIGH SENSITIVITY)    EKG EKG Interpretation  Date/Time:  Sunday June 05 2019 13:59:33 EDT Ventricular Rate:  101 PR Interval:    QRS Duration: 81 QT Interval:  313 QTC Calculation: 406 R Axis:   101 Text Interpretation: Sinus tachycardia Ventricular premature complex Aberrant conduction of SV complex(es) Right axis deviation Minimal ST depression, inferior leads Confirmed by Dene Gentry [phone]) on 06/05/2019 2:03:44 PM   Radiology DG Chest Port 1 View  Result Date: 06/05/2019 CLINICAL DATA:  Shortness of EXAM: PORTABLE CHEST 1 VIEW COMPARISON:  May 09, 2019 FINDINGS: Severe emphysematous changes are noted bilaterally. There is no focal infiltrate. No large pleural effusion. No pneumothorax. The heart size is stable from prior study. Aortic  calcifications are noted. There is no acute osseous abnormality. IMPRESSION: Severe emphysematous changes without evidence for a focal infiltrate. Electronically Signed   By: Jamie Kato.D.  On: 06/05/2019 15:13    Procedures Procedures (including critical care time)  Medications Ordered in ED Medications  cefTRIAXone (ROCEPHIN) 1 g in sodium chloride 0.9 % 100 mL IVPB (has no administration in time range)  azithromycin (ZITHROMAX) 500 mg in sodium chloride 0.9 % 250 mL IVPB (has no administration in time range)  sodium chloride 0.9 % bolus 500 mL (has no administration in time range)    ED Course  I have reviewed the triage vital signs and the nursing notes.  Pertinent labs & imaging results that were available during my care of the patient were reviewed by me and considered in my medical decision making (see chart for details).    MDM Rules/Calculators/A&P                      MDM  Screen complete  ANDREAZ KALMBACH was evaluated in Emergency Department on 06/05/2019 for the symptoms described in the history of present illness. He was evaluated in the context of the global COVID-19 pandemic, which necessitated consideration that the patient might be at risk for infection with the SARS-CoV-2 virus that causes COVID-19. Institutional protocols and algorithms that pertain to the evaluation of patients at risk for COVID-19 are in a state of rapid change based on information released by regulatory bodies including the CDC and federal and state organizations. These policies and algorithms were followed during the patient's care in the ED.  Patient  is presenting for evaluation of reported shortness of breath. Patient with diffuse expiratory wheezes on exam.  His presentation today is consistent with likely COPD exacerbation.  Patient does appear improved following his ED evaluation.  However, patient likely would benefit from overnight observation for continued evaluation and  treatment.  Medicine service is aware of case and will evaluate for admission.     Final Clinical Impression(s) / ED Diagnoses Final diagnoses:  COPD exacerbation [facility])    Rx / DC Orders ED Discharge Orders    None       Valarie Merino, MD 06/05/19 1555

## 2019-06-06 DIAGNOSIS — Z8249 Family history of ischemic heart disease and other diseases of the circulatory system: Secondary | ICD-10-CM | POA: Diagnosis not present

## 2019-06-06 DIAGNOSIS — G4700 Insomnia, unspecified: Secondary | ICD-10-CM | POA: Diagnosis present

## 2019-06-06 DIAGNOSIS — Z20822 Contact with and (suspected) exposure to covid-19: Secondary | ICD-10-CM | POA: Diagnosis present

## 2019-06-06 DIAGNOSIS — I10 Essential (primary) hypertension: Secondary | ICD-10-CM | POA: Diagnosis present

## 2019-06-06 DIAGNOSIS — Z87891 Personal history of nicotine dependence: Secondary | ICD-10-CM | POA: Diagnosis not present

## 2019-06-06 DIAGNOSIS — Z833 Family history of diabetes mellitus: Secondary | ICD-10-CM | POA: Diagnosis not present

## 2019-06-06 DIAGNOSIS — Z79899 Other long term (current) drug therapy: Secondary | ICD-10-CM | POA: Diagnosis not present

## 2019-06-06 DIAGNOSIS — Z7951 Long term (current) use of inhaled steroids: Secondary | ICD-10-CM | POA: Diagnosis not present

## 2019-06-06 DIAGNOSIS — E872 Acidosis: Secondary | ICD-10-CM | POA: Diagnosis present

## 2019-06-06 DIAGNOSIS — J441 Chronic obstructive pulmonary disease with (acute) exacerbation: Secondary | ICD-10-CM | POA: Diagnosis not present

## 2019-06-06 DIAGNOSIS — F209 Schizophrenia, unspecified: Secondary | ICD-10-CM | POA: Diagnosis present

## 2019-06-06 DIAGNOSIS — J438 Other emphysema: Secondary | ICD-10-CM | POA: Diagnosis present

## 2019-06-06 DIAGNOSIS — Z823 Family history of stroke: Secondary | ICD-10-CM | POA: Diagnosis not present

## 2019-06-06 DIAGNOSIS — Z9981 Dependence on supplemental oxygen: Secondary | ICD-10-CM | POA: Diagnosis not present

## 2019-06-06 DIAGNOSIS — J9621 Acute and chronic respiratory failure with hypoxia: Secondary | ICD-10-CM | POA: Diagnosis present

## 2019-06-06 MED ORDER — GUAIFENESIN-DM 100-10 MG/5ML PO SYRP
5.0000 mL | ORAL_SOLUTION | ORAL | Status: DC | PRN
  Administered 2019-06-07: 5 mL via ORAL
  Filled 2019-06-06: qty 5

## 2019-06-06 NOTE — Progress Notes (Signed)
Patient removed both IV's, on call was paged and per MD it is okay to leave IV's out for the time being and will readdress in the morning.

## 2019-06-06 NOTE — Discharge Summary (Signed)
Name: Blake Mcknight MRN: [id number] DOB: 1955-04-30 64 y.o. PCP: Blake Liberty, MD  Date of Admission: 06/05/2019  1:46 PM Date of Discharge:  Attending Physician: Lucious Groves, DO  Discharge Diagnosis: 1. COPD exacerbation   Discharge Medications: Allergies as of 06/07/2019   No Known Allergies     Medication List    STOP taking these medications   acetaminophen 325 MG tablet Commonly known as: TYLENOL   acetaminophen 500 MG tablet Commonly known as: TYLENOL     TAKE these medications   Advair HFA 115-21 MCG/ACT inhaler Generic drug: fluticasone-salmeterol Inhale 1 puff into the lungs 2 (two) times daily.   albuterol 108 (90 Base) MCG/ACT inhaler Commonly known as: VENTOLIN HFA Inhale 2 puffs into the lungs every 4 (four) hours as needed for wheezing or shortness of breath.   albuterol (2.5 MG/3ML) 0.083% nebulizer solution Commonly known as: PROVENTIL Take 3 mLs (2.5 mg total) by nebulization every 3 (three) hours as needed for wheezing or shortness of breath.   atorvastatin 10 MG tablet Commonly known as: LIPITOR Take 10 mg by mouth daily at 6 PM.   B-12 PO Take 1 tablet by mouth every other day.   benztropine 2 MG tablet Commonly known as: COGENTIN Take 1 tablet (2 mg total) by mouth 2 (two) times daily.   carvedilol 3.125 MG tablet Commonly known as: COREG Take 1 tablet (3.125 mg total) by mouth 2 (two) times daily with a meal. What changed: when to take this   cetirizine 10 MG tablet Commonly known as: ZYRTEC Take 10 mg by mouth daily as needed for allergies or rhinitis.   chlorpheniramine-HYDROcodone 10-8 MG/5ML Suer Commonly known as: TUSSIONEX Take 5 mLs by mouth daily as needed.   cholecalciferol 25 MCG (1000 UNIT) tablet Commonly known as: VITAMIN D Take 4,000 Units by mouth every evening.   enalapril 10 MG tablet Commonly known as: VASOTEC Take 10 mg by mouth daily.   famotidine 40 MG tablet Commonly known as:  PEPCID Take 40 mg by mouth 2 (two) times daily.   guaiFENesin-dextromethorphan 100-10 MG/5ML syrup Commonly known as: ROBITUSSIN DM Take 10 mLs by mouth every 4 (four) hours as needed for cough.   mirtazapine 30 MG tablet Commonly known as: REMERON Take 1 tablet (30 mg total) by mouth at bedtime.   multivitamin Tabs tablet Take 1 tablet by mouth daily.   nicotine 14 mg/24hr patch Commonly known as: NICODERM CQ - dosed in mg/24 hours Place 1 patch (14 mg total) onto the skin daily.   omeprazole 20 MG capsule Commonly known as: PRILOSEC Take 1 capsule (20 mg total) by mouth daily.   OXYGEN Inhale 2 L/min into the lungs continuous.   polyethylene glycol 17 g packet Commonly known as: MIRALAX / GLYCOLAX Take 17 g by mouth daily as needed for mild constipation.   predniSONE 20 MG tablet Commonly known as: DELTASONE Take 2 tablets (40 mg total) by mouth daily with breakfast. What changed:   medication strength  how much to take  when to take this   risperidone 4 MG tablet Commonly known as: RISPERDAL Take 1 tablet (4 mg total) by mouth 2 (two) times daily.   traZODone 50 MG tablet Commonly known as: DESYREL Take 1 tablet (50 mg total) by mouth at bedtime as needed for sleep.   VITAMIN C PO Take 1 tablet by mouth 3 (three) times a week.       Disposition and follow-up:   Mr.Blake C  Mcknight was discharged from [facility] in Stable condition.  At the hospital follow up visit please address:  1.  COPD exacerbation. Discharged to complete 5d course of prednisone. Will need follow up with pulmonology after discharge for medication optimization and possibly repeat PFTs. Given his current risperdal and congentin use, LAMA would increase risk of anticholinergic effects.   Follow-up Appointments: PCP 3-5d. Pulmonology in one month Follow-up Information    Blake Liberty, MD .   Specialty: Pulmonary Disease Contact information: [place] [address] [phone]           Hospital Course: [name] is a 64 yo male with chronic respiratory failure on 2L due to COPD who presented on 3/21 for acute shortness of breath. He was found to have a COPD exacerbation. Breathing improved with solumedrol and Mg and he was transitioned to prednisone on day prior to discharge. Supplemental O2 requirement decreased back down to his baseline 2L and overall he was feeling better on day of discharge.   On chart review, it appears that his most recent PFTs were in 2015 at which time pre-FEV1/FVC was 53 and post was 52. Appears that he has had a number of ED visits and admissions for COPD exacerbations. His current home medications are advair and albuterol. He would ideally be on a LAMA as well however this is contraindicated in the setting of his risperdal and congentin use due to increase risk of anticholinergic effects. Pulmonology follow up highly recommended to see if we can optimize his treatments any better.  Discharge Vitals:   BP (!) 133/98 (BP Location: Right Arm)   Pulse 95   Temp 98.2 F (36.8 C) (Oral)   Resp 16   SpO2 92%    Signed: [name], MD 06/07/2019, 11:13 AM   Pager: [phone]

## 2019-06-06 NOTE — Progress Notes (Signed)
   NAME:  Blake Mcknight, MRN:  [id number], DOB:  [birth year], LOS: 0 ADMISSION DATE:  06/05/2019  Subjective/Interm history  Agitated overnight, pulling off tele and IVs. No complaints this morning.  Objective   Blood pressure (!) 152/86, pulse (!) 102, temperature 97.6 F (36.4 C), temperature source Oral, resp. rate (!) 26, SpO2 98 %.     Intake/Output Summary (Last 24 hours) at 06/06/2019 0527 Last data filed at 06/06/2019 0300 Gross per 24 hour  Intake 850 ml  Output 950 ml  Net -100 ml    Examination: GENERAL: chronically ill appearing CARDIAC: tachycardic rate PULMONARY: upper respiratory expiratory wheezing. Distant lower lung sounds ABDOMEN: soft. Nontender to palpation.  Nondistended.    Significant Diagnostic Tests:  3/21 CXR>severe emphysematous changes without evidence of focal infiltrate  Micro Data:  COVID NEG INFLUENZA NEG  3/21 blood cultures>  Labs    CBC Latest Ref Rng & Units 06/05/2019 05/09/2019 03/14/2019  WBC 4.0 - 10.5 K/uL 10.2 9.6 9.5  Hemoglobin 13.0 - 17.0 g/dL 14.6 13.8 15.8  Hematocrit 39.0 - 52.0 % 42.6 40.8 47.5  Platelets 150 - 400 K/uL 254 245 237   BMP Latest Ref Rng & Units 06/05/2019 05/09/2019 03/14/2019  Glucose 70 - 99 mg/dL 133(H) 132(H) 100(H)  BUN 8 - 23 mg/dL 6(L) 6(L) <5(L)  Creatinine 0.61 - 1.24 mg/dL 0.63 0.75 0.85  Sodium 135 - 145 mmol/L 135 134(L) 138  Potassium 3.5 - 5.1 mmol/L 4.3 4.1 4.2  Chloride 98 - 111 mmol/L 99 95(L) 100  CO2 22 - 32 mmol/L 25 27 26   Calcium 8.9 - 10.3 mg/dL 9.3 9.0 9.6    Summary  [name] who is presenting with shortness of breath and admitted to IMTS for COPD exacerbation with acute hypoxic respiratory failure.  Assessment & Plan:  Active Problems:   COPD exacerbation (HCC)  Acute hypoxic respiratory failure COPD exacerbation with paraseptal emphysema. 2015 PFTs show FEV1/FVC 52%. On 2-4L supplemental O2 at baseline. On 3L supplemental O2 this  morning No CXR findings to suggest infectious findings. Plan Continue prednisone (day #2/5) duonebs q8h Wean supplemental O2 for saturation goal >88% Will obtain an ambulating O2 saturation. If stable, will likely be able to discharge later today  Schizophrenia. Follows with Dr. Adele Schilder. Insomnia Plan: continue remeron, risperdal and congentin  Hypertension. Blood pressures stable. Continue coreg  Best practice:  CODE STATUS: partial--no chest compressions, otherwise full scope of care with intubation Diet: heart healthy DVT for prophylaxis: lovenox Dispo: likely 0-1d   [name], MD [place] PGY-1 PAGER #: [phone] 06/06/19  5:27 AM

## 2019-06-06 NOTE — Progress Notes (Signed)
UPDATE NOTE  Dr. Sharon Seller and myself went to patient's bedside for reassessment prior to discharge. Patient's sister was at bedside.  He was resting comfortably on 2.5L [place]. Sister was concerned regarding him returning home today. We discussed that it is reasonable to keep him tonight to make sure he is able to ambulate without dropping O2 sats too dramatically. We also did briefly discuss overall prognosis given his severe emphysematous disease. Encouraged follow up with pulmonology after discharge for medication optimization and possibly repeat PFTs since they were last done in 2015.   Will plan for discharge tomorrow.

## 2019-06-07 DIAGNOSIS — J441 Chronic obstructive pulmonary disease with (acute) exacerbation: Secondary | ICD-10-CM

## 2019-06-07 DIAGNOSIS — F209 Schizophrenia, unspecified: Secondary | ICD-10-CM

## 2019-06-07 DIAGNOSIS — Z79899 Other long term (current) drug therapy: Secondary | ICD-10-CM

## 2019-06-07 DIAGNOSIS — J9621 Acute and chronic respiratory failure with hypoxia: Secondary | ICD-10-CM

## 2019-06-07 DIAGNOSIS — Z7951 Long term (current) use of inhaled steroids: Secondary | ICD-10-CM

## 2019-06-07 LAB — BLOOD GAS, ARTERIAL
Acid-Base Excess: 4.8 mmol/L — ABNORMAL HIGH (ref 0.0–2.0)
Bicarbonate: 29.9 mmol/L — ABNORMAL HIGH (ref 20.0–28.0)
FIO2: 21
O2 Saturation: 89.2 %
Patient temperature: 36.8
pCO2 arterial: 53.1 mmHg — ABNORMAL HIGH (ref 32.0–48.0)
pH, Arterial: 7.368 (ref 7.350–7.450)
pO2, Arterial: 61.1 mmHg — ABNORMAL LOW (ref 83.0–108.0)

## 2019-06-07 MED ORDER — PREDNISONE 20 MG PO TABS: 40.0000 mg | ORAL_TABLET | Freq: Every day | ORAL | 0 refills | Status: DC

## 2019-06-07 MED FILL — predniSONE 20 MG TABS: 20 | 1 days supply | Qty: 2 | Fill #0

## 2019-06-07 NOTE — TOC Initial Note (Signed)
Transition of Care [facility]) - Initial/Assessment Note    Patient Details  Name: Blake Mcknight MRN: [id number] Date of Birth: Apr 07, 1955  [facility]) CM/SW Contact:    Angelita Ingles, RN Phone Number: 06/07/2019, 10:56 AM  Clinical Narrative: CM at bedside for high risk readmission assessment. Patient states that his goal is to get out of the hospital. Patient confirms that his primary physician is Dr. Katherine Roan and he follows up on a regular basis. Patient denies any transportation issues and states that he does not have any issues affording or obtaining his medications. Patient states that his sister is his main support person. Patient states that he does not currently have any equipment at home. Will continue to follow.                   Expected Discharge Plan: Home/Self Care(lives with sister) Barriers to Discharge: Continued Medical Work up   Patient Goals and CMS Choice Patient states their goals for this hospitalization and ongoing recovery are:: To get out of the hospital      Expected Discharge Plan and Services Expected Discharge Plan: Home/Self Care(lives with sister)   Discharge Planning Services: CM Consult   Living arrangements for the past 2 months: Single Family Home                                      Prior Living Arrangements/Services Living arrangements for the past 2 months: Single Family Home Lives with:: Siblings(sister) Patient language and need for interpreter reviewed:: Yes        Need for Family Participation in Patient Care: Yes (Comment) Care giver support system in place?: Yes (comment)   Criminal Activity/Legal Involvement Pertinent to Current Situation/Hospitalization: No - Comment as needed  Activities of Daily Living      Permission Sought/Granted                  Emotional Assessment Appearance:: Appears stated age Attitude/Demeanor/Rapport: Gracious Affect (typically observed): Calm Orientation: :  Oriented to Self, Oriented to Place, Oriented to  Time, Oriented to Situation Alcohol / Substance Use: Not Applicable Psych Involvement: No (comment)  Admission diagnosis:  COPD exacerbation ([place]) [J44.1] Patient Active Problem List   Diagnosis Date Noted  . COPD exacerbation ([place]) 06/05/2019  . Do not resuscitate 02/18/2019  . Acute respiratory failure with hypercapnia ([place]) 06/16/2016  . Acute respiratory failure with hypoxia ([place]) 06/16/2016  . Acute on chronic respiratory failure with hypoxia ([place]) 05/25/2016  . Altered mental status 09/22/2015  . Hyponatremia 09/22/2015  . Elevated lactic acid level 09/22/2015  . Acute encephalopathy   . Bullous emphysema ([place]) 07/26/2013  . Schizophrenia ([place]) 06/28/2013  . Tobacco use disorder 06/06/2013  . Abdominal pain 06/03/2013  . Sepsis ([place]) 06/03/2013  . Elevated LFTs 06/03/2013  . HTN (hypertension) 06/03/2013  . HLD (hyperlipidemia) 06/03/2013  . Chronic obstructive pulmonary disease with acute exacerbation ([place]) 06/03/2013  . Calculus of bile duct without mention of cholecystitis or obstruction 06/03/2013   PCP:  [name], MD Pharmacy:   [facility] [place], [address] [place] [address] Phone: [phone] Fax: [phone]  [facility] [place], [place] - [address] [place] [address] Phone: [phone] Fax: [phone]  Social Determinants of Health (SDOH) Interventions    Readmission Risk Interventions Readmission Risk Prevention Plan 06/07/2019  Transportation Screening Complete  PCP or Specialist Appt within 3-5 Days Complete  HRI or Home Care Consult Complete  Palliative Care Screening Not Applicable  Medication Review (RN Care Manager) Referral to Pharmacy  Some recent data might be hidden

## 2019-06-07 NOTE — Progress Notes (Addendum)
Pt appearing in respiratory distress and not wanting to lay back in the bed or wear his oxygen. Wheezing and tachypneic. Oxygen sats between 87-89% ra.  Refusing abgs to be drawn.  Pt educated and finally allowed abgs to be drawn by respiratory therapy.  RN then administered PRN neb treatment.  Pt had immediate relief and breathing comfortably at this time.

## 2019-06-07 NOTE — TOC Transition Note (Signed)
Transition of Care [facility]) - CM/SW Discharge Note   Patient Details  Name: Blake Mcknight MRN: [id number] Date of Birth: [birth year]  [facility] CM/SW Contact:  Angelita Ingles, RN Phone Number: 06/07/2019, 2:18 PM   Clinical Narrative:   30 day supply of home meds delivered to bedside via [place]. Bedside nurse reports that O2 sats while ambulating were 93% or greater. Nurse to document in chart. Patient does not qualify for home O2 with current oxygen saturation levels. No DME needs at this time. Patient states that sister will transport via private vehicle. No further needs at this time.    Final next level of care: Home/Self Care(with assist of sister) Barriers to Discharge: No Barriers Identified   Patient Goals and CMS Choice Patient states their goals for this hospitalization and ongoing recovery are:: To get out of the hospital      Discharge Placement                       Discharge Plan and Services   Discharge Planning Services: CM Consult                                 Social Determinants of Health (SDOH) Interventions     Readmission Risk Interventions Readmission Risk Prevention Plan 06/07/2019  Transportation Screening Complete  PCP or Specialist Appt within 3-5 Days Complete  HRI or Home Care Consult Complete  Palliative Care Screening Not Applicable  Medication Review (RN Care Manager) Referral to Pharmacy  Some recent data might be hidden

## 2019-06-07 NOTE — Progress Notes (Signed)
SATURATION QUALIFICATIONS: (This note is used to comply with regulatory documentation for home oxygen)  Patient Saturations on Room Air at Rest = 98%  Patient Saturations on Room Air while Ambulating = 93%  Please briefly explain why patient needs home oxygen: Patient was able to ambulate around the room and bathroom with saturations maintaining 93% and above.

## 2019-06-07 NOTE — Plan of Care (Signed)
  Problem: Education: Goal: Knowledge of General Education information will improve Description: Including pain rating scale, medication(s)/side effects and non-pharmacologic comfort measures Outcome: Progressing   Problem: Health Behavior/Discharge Planning: Goal: Ability to manage health-related needs will improve Outcome: Progressing   Problem: Clinical Measurements: Goal: Ability to maintain clinical measurements within normal limits will improve Outcome: Progressing Goal: Respiratory complications will improve Outcome: Progressing   Problem: Activity: Goal: Risk for activity intolerance will decrease Outcome: Progressing   Problem: Coping: Goal: Level of anxiety will decrease Outcome: Progressing   

## 2019-06-07 NOTE — Progress Notes (Addendum)
   NAME:  Blake Mcknight, MRN:  [id number], DOB:  09-Jul-1955, LOS: 1 ADMISSION DATE:  06/05/2019  Subjective/Interm history  No overnight events. Back on home supplemental O2 requirement of 2L. No complaints this morning.  Objective   Blood pressure 112/76, pulse 79, temperature 97.8 F (36.6 C), temperature source Oral, resp. rate (!) 24, SpO2 100 %.     Intake/Output Summary (Last 24 hours) at 06/07/2019 0501 Last data filed at 06/06/2019 2146 Gross per 24 hour  Intake 240 ml  Output 425 ml  Net -185 ml    Examination: GENERAL: in NAD CARDIAC: RRR PULMONARY: breathing comfortably on 2L [place]. Expiratory wheezes throughout. Lung sounds diminished ABDOMEN: nondistended   Significant Diagnostic Tests:  3/21 CXR>severe emphysematous changes without evidence of focal infiltrate  Summary  [name] who is presenting with shortness of breath and admitted to IMTS for COPD exacerbation with acute hypoxic respiratory failure.  Assessment & Plan:  Active Problems:   COPD exacerbation (HCC)  Acute hypoxic respiratory failure COPD exacerbation with paraseptal emphysema. 2015 PFTs show FEV1/FVC 52%. On 2-4L supplemental O2 at baseline. Home medications are albuterol and advair Back to 2L supplemental O2 this morning Plan Continue prednisone (day #3/5) Will likely discharge with LAMA in addition to his advair--ideally trelegy if covered under his insurance. TOC consulted to assist with this. Discharge today.   Best practice:  CODE STATUS: partial--no chest compressions, otherwise full scope of care with intubation Diet: regular DVT for prophylaxis: lovenox Dispo: discharge today. pulm follow up at discharge   [name], MD [place] PGY-1 PAGER #: [phone] 06/07/19  5:01 AM

## 2019-06-10 LAB — CULTURE, BLOOD (ROUTINE X 2)
Culture: NO GROWTH
Culture: NO GROWTH
Special Requests: ADEQUATE
Special Requests: ADEQUATE

## 2019-06-11 DIAGNOSIS — E785 Hyperlipidemia, unspecified: Secondary | ICD-10-CM | POA: Diagnosis not present

## 2019-06-11 DIAGNOSIS — Z87891 Personal history of nicotine dependence: Secondary | ICD-10-CM | POA: Diagnosis not present

## 2019-06-11 DIAGNOSIS — Z9181 History of falling: Secondary | ICD-10-CM | POA: Diagnosis not present

## 2019-06-11 DIAGNOSIS — F200 Paranoid schizophrenia: Secondary | ICD-10-CM | POA: Diagnosis not present

## 2019-06-11 DIAGNOSIS — G4700 Insomnia, unspecified: Secondary | ICD-10-CM | POA: Diagnosis not present

## 2019-06-11 DIAGNOSIS — F17210 Nicotine dependence, cigarettes, uncomplicated: Secondary | ICD-10-CM | POA: Diagnosis not present

## 2019-06-11 DIAGNOSIS — Z9981 Dependence on supplemental oxygen: Secondary | ICD-10-CM | POA: Diagnosis not present

## 2019-06-11 DIAGNOSIS — I10 Essential (primary) hypertension: Secondary | ICD-10-CM | POA: Diagnosis not present

## 2019-06-11 DIAGNOSIS — J441 Chronic obstructive pulmonary disease with (acute) exacerbation: Secondary | ICD-10-CM | POA: Diagnosis not present

## 2019-06-11 DIAGNOSIS — J9610 Chronic respiratory failure, unspecified whether with hypoxia or hypercapnia: Secondary | ICD-10-CM | POA: Diagnosis not present

## 2019-06-11 DIAGNOSIS — J439 Emphysema, unspecified: Secondary | ICD-10-CM | POA: Diagnosis not present

## 2019-06-11 DIAGNOSIS — J9611 Chronic respiratory failure with hypoxia: Secondary | ICD-10-CM | POA: Diagnosis not present

## 2019-06-16 DIAGNOSIS — J441 Chronic obstructive pulmonary disease with (acute) exacerbation: Secondary | ICD-10-CM | POA: Diagnosis not present

## 2019-06-16 DIAGNOSIS — F200 Paranoid schizophrenia: Secondary | ICD-10-CM | POA: Diagnosis not present

## 2019-06-16 DIAGNOSIS — J9611 Chronic respiratory failure with hypoxia: Secondary | ICD-10-CM | POA: Diagnosis not present

## 2019-06-16 DIAGNOSIS — F17210 Nicotine dependence, cigarettes, uncomplicated: Secondary | ICD-10-CM | POA: Diagnosis not present

## 2019-06-16 DIAGNOSIS — J439 Emphysema, unspecified: Secondary | ICD-10-CM | POA: Diagnosis not present

## 2019-06-16 DIAGNOSIS — E785 Hyperlipidemia, unspecified: Secondary | ICD-10-CM | POA: Diagnosis not present

## 2019-06-16 DIAGNOSIS — G4700 Insomnia, unspecified: Secondary | ICD-10-CM | POA: Diagnosis not present

## 2019-06-16 DIAGNOSIS — J9610 Chronic respiratory failure, unspecified whether with hypoxia or hypercapnia: Secondary | ICD-10-CM | POA: Diagnosis not present

## 2019-06-16 DIAGNOSIS — I10 Essential (primary) hypertension: Secondary | ICD-10-CM | POA: Diagnosis not present

## 2019-06-20 ENCOUNTER — Emergency Department (HOSPITAL_COMMUNITY): Payer: Medicare Other

## 2019-06-20 ENCOUNTER — Encounter (HOSPITAL_COMMUNITY): Payer: Self-pay

## 2019-06-20 ENCOUNTER — Other Ambulatory Visit: Payer: Self-pay

## 2019-06-20 ENCOUNTER — Inpatient Hospital Stay (HOSPITAL_COMMUNITY)
Admission: EM | Admit: 2019-06-20 | Discharge: 2019-06-23 | DRG: 190 | Disposition: A | Discharge status: Home Health | Payer: Medicare Other | Attending: Internal Medicine | Admitting: Internal Medicine

## 2019-06-20 DIAGNOSIS — Z823 Family history of stroke: Secondary | ICD-10-CM

## 2019-06-20 DIAGNOSIS — R4182 Altered mental status, unspecified: Secondary | ICD-10-CM | POA: Diagnosis not present

## 2019-06-20 DIAGNOSIS — R079 Chest pain, unspecified: Secondary | ICD-10-CM | POA: Diagnosis not present

## 2019-06-20 DIAGNOSIS — R0689 Other abnormalities of breathing: Secondary | ICD-10-CM | POA: Diagnosis not present

## 2019-06-20 DIAGNOSIS — F17213 Nicotine dependence, cigarettes, with withdrawal: Secondary | ICD-10-CM | POA: Diagnosis present

## 2019-06-20 DIAGNOSIS — R0602 Shortness of breath: Secondary | ICD-10-CM | POA: Diagnosis not present

## 2019-06-20 DIAGNOSIS — J9621 Acute and chronic respiratory failure with hypoxia: Secondary | ICD-10-CM | POA: Diagnosis not present

## 2019-06-20 DIAGNOSIS — K219 Gastro-esophageal reflux disease without esophagitis: Secondary | ICD-10-CM | POA: Diagnosis present

## 2019-06-20 DIAGNOSIS — Z20822 Contact with and (suspected) exposure to covid-19: Secondary | ICD-10-CM | POA: Diagnosis present

## 2019-06-20 DIAGNOSIS — Z79899 Other long term (current) drug therapy: Secondary | ICD-10-CM

## 2019-06-20 DIAGNOSIS — Z833 Family history of diabetes mellitus: Secondary | ICD-10-CM

## 2019-06-20 DIAGNOSIS — R0902 Hypoxemia: Secondary | ICD-10-CM | POA: Diagnosis not present

## 2019-06-20 DIAGNOSIS — F209 Schizophrenia, unspecified: Secondary | ICD-10-CM | POA: Diagnosis not present

## 2019-06-20 DIAGNOSIS — R000 Tachycardia, unspecified: Secondary | ICD-10-CM | POA: Diagnosis not present

## 2019-06-20 DIAGNOSIS — Z9981 Dependence on supplemental oxygen: Secondary | ICD-10-CM

## 2019-06-20 DIAGNOSIS — I10 Essential (primary) hypertension: Secondary | ICD-10-CM | POA: Diagnosis present

## 2019-06-20 DIAGNOSIS — E785 Hyperlipidemia, unspecified: Secondary | ICD-10-CM | POA: Diagnosis present

## 2019-06-20 DIAGNOSIS — J441 Chronic obstructive pulmonary disease with (acute) exacerbation: Secondary | ICD-10-CM | POA: Diagnosis present

## 2019-06-20 DIAGNOSIS — R319 Hematuria, unspecified: Secondary | ICD-10-CM | POA: Diagnosis present

## 2019-06-20 DIAGNOSIS — Z8249 Family history of ischemic heart disease and other diseases of the circulatory system: Secondary | ICD-10-CM

## 2019-06-20 DIAGNOSIS — Z66 Do not resuscitate: Secondary | ICD-10-CM | POA: Diagnosis present

## 2019-06-20 DIAGNOSIS — J439 Emphysema, unspecified: Secondary | ICD-10-CM | POA: Diagnosis not present

## 2019-06-20 DIAGNOSIS — R062 Wheezing: Secondary | ICD-10-CM | POA: Diagnosis not present

## 2019-06-20 LAB — TROPONIN I (HIGH SENSITIVITY)
Troponin I (High Sensitivity): 5 ng/L (ref <18)
Troponin I (High Sensitivity): 6 ng/L (ref <18)

## 2019-06-20 LAB — POCT I-STAT 7, (LYTES, BLD GAS, ICA,H+H)
Acid-Base Excess: 1 mmol/L (ref 0.0–2.0)
Bicarbonate: 27.4 mmol/L (ref 20.0–28.0)
Calcium, Ion: 1.25 mmol/L (ref 1.15–1.40)
HCT: 49 % (ref 39.0–52.0)
Hemoglobin: 16.7 g/dL (ref 13.0–17.0)
O2 Saturation: 97 %
Potassium: 4.7 mmol/L (ref 3.5–5.1)
Sodium: 131 mmol/L — ABNORMAL LOW (ref 135–145)
TCO2: 29 mmol/L (ref 22–32)
pCO2 arterial: 47.1 mmHg (ref 32.0–48.0)
pH, Arterial: 7.373 (ref 7.350–7.450)
pO2, Arterial: 92 mmHg (ref 83.0–108.0)

## 2019-06-20 LAB — COMPREHENSIVE METABOLIC PANEL
ALT: 23 U/L (ref 0–44)
AST: 18 U/L (ref 15–41)
Albumin: 4.1 g/dL (ref 3.5–5.0)
Alkaline Phosphatase: 47 U/L (ref 38–126)
Anion gap: 13 (ref 5–15)
BUN: 12 mg/dL (ref 8–23)
CO2: 26 mmol/L (ref 22–32)
Calcium: 9.3 mg/dL (ref 8.9–10.3)
Chloride: 96 mmol/L — ABNORMAL LOW (ref 98–111)
Creatinine, Ser: 0.85 mg/dL (ref 0.61–1.24)
GFR calc Af Amer: >60 mL/min (ref >60)
GFR calc non Af Amer: >60 mL/min (ref >60)
Glucose, Bld: 142 mg/dL — ABNORMAL HIGH (ref 70–99)
Potassium: 4.3 mmol/L (ref 3.5–5.1)
Sodium: 135 mmol/L (ref 135–145)
Total Bilirubin: 1.1 mg/dL (ref 0.3–1.2)
Total Protein: 7.4 g/dL (ref 6.5–8.1)

## 2019-06-20 LAB — CBC WITH DIFFERENTIAL/PLATELET
Abs Immature Granulocytes: 0.04 10*3/uL (ref 0.00–0.07)
Basophils Absolute: 0.1 10*3/uL (ref 0.0–0.1)
Basophils Relative: 1 %
Eosinophils Absolute: 0.7 10*3/uL — ABNORMAL HIGH (ref 0.0–0.5)
Eosinophils Relative: 6 %
HCT: 47.7 % (ref 39.0–52.0)
Hemoglobin: 16 g/dL (ref 13.0–17.0)
Immature Granulocytes: 0 %
Lymphocytes Relative: 28 %
Lymphs Abs: 3.1 10*3/uL (ref 0.7–4.0)
MCH: 31.1 pg (ref 26.0–34.0)
MCHC: 33.5 g/dL (ref 30.0–36.0)
MCV: 92.8 fL (ref 80.0–100.0)
Monocytes Absolute: 1 10*3/uL (ref 0.1–1.0)
Monocytes Relative: 9 %
Neutro Abs: 6.3 10*3/uL (ref 1.7–7.7)
Neutrophils Relative %: 56 %
Platelets: 289 10*3/uL (ref 150–400)
RBC: 5.14 MIL/uL (ref 4.22–5.81)
RDW: 12.1 % (ref 11.5–15.5)
WBC: 11.2 10*3/uL — ABNORMAL HIGH (ref 4.0–10.5)
nRBC: 0 % (ref 0.0–0.2)

## 2019-06-20 LAB — BRAIN NATRIURETIC PEPTIDE: B Natriuretic Peptide: 18.3 pg/mL (ref 0.0–100.0)

## 2019-06-20 LAB — LIPASE, BLOOD: Lipase: 20 U/L (ref 11–51)

## 2019-06-20 LAB — AMMONIA: Ammonia: 15 umol/L (ref 9–35)

## 2019-06-20 LAB — POC SARS CORONAVIRUS 2 AG -  ED: SARS Coronavirus 2 Ag: NEGATIVE

## 2019-06-20 LAB — ETHANOL: Alcohol, Ethyl (B): <10 mg/dL (ref <10)

## 2019-06-20 LAB — SARS CORONAVIRUS 2 (TAT 6-24 HRS): SARS Coronavirus 2: NEGATIVE

## 2019-06-20 LAB — CBG MONITORING, ED: Glucose-Capillary: 129 mg/dL — ABNORMAL HIGH (ref 70–99)

## 2019-06-20 MED ORDER — IOHEXOL 350 MG/ML SOLN
75.0000 mL | Freq: Once | INTRAVENOUS | Status: AC | PRN
  Administered 2019-06-20: 14:00:00 75 mL via INTRAVENOUS

## 2019-06-20 MED ORDER — PANTOPRAZOLE SODIUM 40 MG PO TBEC
40.0000 mg | DELAYED_RELEASE_TABLET | Freq: Every day | ORAL | Status: DC
  Administered 2019-06-20 – 2019-06-23 (×4): 40 mg via ORAL
  Filled 2019-06-20 (×4): qty 1

## 2019-06-20 MED ORDER — ATORVASTATIN CALCIUM 10 MG PO TABS
10.0000 mg | ORAL_TABLET | Freq: Every day | ORAL | Status: DC
  Administered 2019-06-21 – 2019-06-22 (×2): 10 mg via ORAL
  Filled 2019-06-20 (×2): qty 1

## 2019-06-20 MED ORDER — ENALAPRIL MALEATE 5 MG PO TABS
10.0000 mg | ORAL_TABLET | Freq: Every day | ORAL | Status: DC
  Administered 2019-06-20 – 2019-06-23 (×4): 10 mg via ORAL
  Filled 2019-06-20 (×4): qty 2

## 2019-06-20 MED ORDER — POLYETHYLENE GLYCOL 3350 17 G PO PACK: 17.0000 g | PACK | Freq: Every day | ORAL | Status: DC | PRN

## 2019-06-20 MED ORDER — SODIUM CHLORIDE 0.9 % IV SOLN
1.0000 g | INTRAVENOUS | Status: DC
  Administered 2019-06-20: 1 g via INTRAVENOUS
  Filled 2019-06-20: qty 10

## 2019-06-20 MED ORDER — ALBUTEROL SULFATE HFA 108 (90 BASE) MCG/ACT IN AERS
4.0000 | INHALATION_SPRAY | Freq: Once | RESPIRATORY_TRACT | Status: AC
  Administered 2019-06-20: 4 via RESPIRATORY_TRACT

## 2019-06-20 MED ORDER — ALBUTEROL SULFATE HFA 108 (90 BASE) MCG/ACT IN AERS
4.0000 | INHALATION_SPRAY | Freq: Once | RESPIRATORY_TRACT | Status: AC
  Administered 2019-06-20: 13:00:00 4 via RESPIRATORY_TRACT
  Filled 2019-06-20: qty 6.7

## 2019-06-20 MED ORDER — FLUTICASONE FUROATE-VILANTEROL 200-25 MCG/INH IN AEPB
1.0000 | INHALATION_SPRAY | Freq: Every day | RESPIRATORY_TRACT | Status: DC
  Administered 2019-06-21 – 2019-06-23 (×3): 1 via RESPIRATORY_TRACT
  Filled 2019-06-20: qty 28

## 2019-06-20 MED ORDER — ALBUTEROL SULFATE (2.5 MG/3ML) 0.083% IN NEBU: 2.5000 mg | INHALATION_SOLUTION | RESPIRATORY_TRACT | Status: DC | PRN

## 2019-06-20 MED ORDER — MIRTAZAPINE 15 MG PO TABS
30.0000 mg | ORAL_TABLET | Freq: Every day | ORAL | Status: DC
  Administered 2019-06-20 – 2019-06-22 (×3): 30 mg via ORAL
  Filled 2019-06-20: qty 1
  Filled 2019-06-20 (×3): qty 2

## 2019-06-20 MED ORDER — NICOTINE 14 MG/24HR TD PT24
14.0000 mg | MEDICATED_PATCH | TRANSDERMAL | Status: DC
  Administered 2019-06-20 – 2019-06-22 (×3): 14 mg via TRANSDERMAL
  Filled 2019-06-20 (×3): qty 1

## 2019-06-20 MED ORDER — ACETAMINOPHEN 650 MG RE SUPP: 650.0000 mg | Freq: Four times a day (QID) | RECTAL | Status: DC | PRN

## 2019-06-20 MED ORDER — BENZTROPINE MESYLATE 2 MG PO TABS
2.0000 mg | ORAL_TABLET | Freq: Two times a day (BID) | ORAL | Status: DC
  Administered 2019-06-20 – 2019-06-23 (×6): 2 mg via ORAL
  Filled 2019-06-20 (×7): qty 1

## 2019-06-20 MED ORDER — SODIUM CHLORIDE 0.9 % IV BOLUS
1000.0000 mL | Freq: Once | INTRAVENOUS | Status: AC
  Administered 2019-06-20: 17:00:00 1000 mL via INTRAVENOUS

## 2019-06-20 MED ORDER — AZITHROMYCIN 250 MG PO TABS
250.0000 mg | ORAL_TABLET | Freq: Every day | ORAL | Status: DC
  Administered 2019-06-21 – 2019-06-23 (×3): 250 mg via ORAL
  Filled 2019-06-20 (×3): qty 1

## 2019-06-20 MED ORDER — ALBUTEROL SULFATE (2.5 MG/3ML) 0.083% IN NEBU
2.5000 mg | INHALATION_SOLUTION | RESPIRATORY_TRACT | Status: DC | PRN
  Administered 2019-06-20 – 2019-06-21 (×3): 2.5 mg via RESPIRATORY_TRACT
  Filled 2019-06-20 (×3): qty 3

## 2019-06-20 MED ORDER — FAMOTIDINE 20 MG PO TABS
20.0000 mg | ORAL_TABLET | Freq: Two times a day (BID) | ORAL | Status: DC
  Administered 2019-06-20 – 2019-06-23 (×6): 20 mg via ORAL
  Filled 2019-06-20 (×6): qty 1

## 2019-06-20 MED ORDER — ENOXAPARIN SODIUM 40 MG/0.4ML ~~LOC~~ SOLN
40.0000 mg | SUBCUTANEOUS | Status: DC
  Administered 2019-06-20 – 2019-06-22 (×3): 40 mg via SUBCUTANEOUS
  Filled 2019-06-20 (×3): qty 0.4

## 2019-06-20 MED ORDER — IPRATROPIUM BROMIDE HFA 17 MCG/ACT IN AERS
2.0000 | INHALATION_SPRAY | Freq: Once | RESPIRATORY_TRACT | Status: AC
  Administered 2019-06-20: 13:00:00 2 via RESPIRATORY_TRACT
  Filled 2019-06-20: qty 12.9

## 2019-06-20 MED ORDER — ACETAMINOPHEN 325 MG PO TABS: 650.0000 mg | ORAL_TABLET | Freq: Four times a day (QID) | ORAL | Status: DC | PRN

## 2019-06-20 MED ORDER — ALBUTEROL SULFATE HFA 108 (90 BASE) MCG/ACT IN AERS: 2.0000 | INHALATION_SPRAY | RESPIRATORY_TRACT | Status: DC | PRN

## 2019-06-20 MED ORDER — AZITHROMYCIN 250 MG PO TABS
500.0000 mg | ORAL_TABLET | Freq: Every day | ORAL | Status: AC
  Administered 2019-06-20: 500 mg via ORAL
  Filled 2019-06-20: qty 2

## 2019-06-20 MED ORDER — TRAZODONE HCL 50 MG PO TABS: 50.0000 mg | ORAL_TABLET | Freq: Every evening | ORAL | Status: DC | PRN

## 2019-06-20 MED ORDER — SODIUM CHLORIDE 0.9% FLUSH
3.0000 mL | Freq: Two times a day (BID) | INTRAVENOUS | Status: DC
  Administered 2019-06-21 – 2019-06-22 (×3): 3 mL via INTRAVENOUS

## 2019-06-20 MED ORDER — RISPERIDONE 1 MG PO TABS
4.0000 mg | ORAL_TABLET | Freq: Two times a day (BID) | ORAL | Status: DC
  Administered 2019-06-20 – 2019-06-23 (×6): 4 mg via ORAL
  Filled 2019-06-20 (×6): qty 4
  Filled 2019-06-20: qty 2

## 2019-06-20 NOTE — Hospital Course (Signed)
Has medical history of COPD, chronic respiratory failure on 2 L oxygen at home, HTN, HLD, schizophrenia  Recent hospitalization 3/21-3/22 for COPD exacerbation. Is a candidate for LAMA however contraindicated in setting of Risperdal and?  Continue use due to increased risk of anticholinergic effect. Home meds: Advair, albuterol, Lipitor, Coreg 3.125 mg twice daily, enalapril 10 mg daily, risperidone, trazodone,  Presented with shortness of breath and chest pain.  On 2 L oxygen at home.  Hypoxic at 89% at room air on arrival. Sinus tachycardia on EKG. work-up negative for PE With hypoxia on arrival, O2 saturation 89% at room air.  Still wheezing despite giving Solu-Medrol, magnesium, but hypoxia improved.saturating well at rest but hypoxic with ambulation.  ABG unremarkable Point-of-care Covid negative, 6-24-hour Covid test pending.

## 2019-06-20 NOTE — ED Provider Notes (Signed)
[facility] EMERGENCY DEPARTMENT Provider Note   CSN: [id number] Arrival date & time: 06/20/19  1243     History Chief Complaint  Patient presents with  . Shortness of Breath    Blake Mcknight is a 64 y.o. male.  HPI      Level 5 caveat due to abnormal mental status.  Blake Mcknight is a 64 y.o. male, with a history of COPD, tobacco use, hyperlipidemia, HTN, schizophrenia, presenting to the ED with shortness of breath and chest pain that began suddenly while at rest just a few hours prior to arrival. Pain is in the central lower chest, "feels like a pushing," severe, nonradiating. EMS reports patient was hypoxic down to 89% on scene.  He received albuterol, Solu-Medrol, magnesium. Denies fever/chills, cough, abdominal pain, dizziness, syncope, lower extremity edema/pain, N/V/D, or any other complaints.   Past Medical History:  Diagnosis Date  . COPD (chronic obstructive pulmonary disease) (HCC)    with bullous emphysema.   Marland Kitchen Heavy cigarette smoker before 2003   pt claims only 10 cigs per day, never heavier amounts.   Marland Kitchen HLD (hyperlipidemia)   . Hypertension   . Schizophrenia ([place]) 06/28/2013   This is a chronic condition and he lives with family    Patient Active Problem List   Diagnosis Date Noted  . COPD exacerbation ([place]) 06/05/2019  . Do not resuscitate 02/18/2019  . Acute respiratory failure with hypercapnia ([place]) 06/16/2016  . Acute respiratory failure with hypoxia ([place]) 06/16/2016  . Acute on chronic respiratory failure with hypoxia ([place]) 05/25/2016  . Altered mental status 09/22/2015  . Hyponatremia 09/22/2015  . Elevated lactic acid level 09/22/2015  . Acute encephalopathy   . Bullous emphysema ([place]) 07/26/2013  . Schizophrenia ([place]) 06/28/2013  . Tobacco use disorder 06/06/2013  . Abdominal pain 06/03/2013  . Sepsis ([place]) 06/03/2013  . Elevated LFTs 06/03/2013  . HTN (hypertension) 06/03/2013  . HLD (hyperlipidemia)  06/03/2013  . Chronic obstructive pulmonary disease with acute exacerbation ([place]) 06/03/2013  . Calculus of bile duct without mention of cholecystitis or obstruction 06/03/2013    Past Surgical History:  Procedure Laterality Date  . CHOLECYSTECTOMY N/A 06/06/2013   Procedure: LAPAROSCOPIC CHOLECYSTECTOMY WITH ATTEMPTED INTRAOPERATIVE CHOLANGIOGRAM;  Surgeon: Zenovia Jarred, MD;  Location: [facility];  Service: General;  Laterality: N/A;  . ERCP N/A 06/03/2013   Procedure: ENDOSCOPIC RETROGRADE CHOLANGIOPANCREATOGRAPHY (ERCP);  Surgeon: Ladene Artist, MD;  Location[facility];  Service: Endoscopy;  Laterality: N/A;  . NO PAST SURGERIES         Family History  Problem Relation Age of Onset  . Diabetes Mellitus II Mother   . Diabetes Mother   . Heart disease Father   . Stroke Maternal Aunt   . CAD Neg Hx     Social History   Tobacco Use  . Smoking status: Current Every Day Smoker    Packs/day: 1.00    Years: 41.00    Pack years: 41.00    Types: Cigarettes  . Smokeless tobacco: Never Used  . Tobacco comment: Gets 4 puffs a day.   Substance Use Topics  . Alcohol use: No  . Drug use: No    Home Medications Prior to Admission medications   Medication Sig Start Date End Date Taking? Authorizing Provider  ADVAIR HFA 115-21 MCG/ACT inhaler Inhale 1 puff into the lungs 2 (two) times daily. 09/21/15   [provider]  albuterol (PROVENTIL) (2.5 MG/3ML) 0.083% nebulizer solution Take 3 mLs (2.5  mg total) by nebulization every 3 (three) hours as needed for wheezing or shortness of breath. 02/20/19   Aline August, MD  albuterol (VENTOLIN HFA) 108 (90 Base) MCG/ACT inhaler Inhale 2 puffs into the lungs every 4 (four) hours as needed for wheezing or shortness of breath. 02/20/19   Aline August, MD  Ascorbic Acid (VITAMIN C PO) Take 1 tablet by mouth 3 (three) times a week.     [provider]  atorvastatin (LIPITOR) 10 MG tablet Take 10 mg by mouth daily at 6 PM.   09/21/15   [provider]  benztropine (COGENTIN) 2 MG tablet Take 1 tablet (2 mg total) by mouth 2 (two) times daily. 05/09/19   Arfeen, Arlyce Harman, MD  carvedilol (COREG) 3.125 MG tablet Take 1 tablet (3.125 mg total) by mouth 2 (two) times daily with a meal. Patient taking differently: Take 3.125 mg by mouth every morning.  02/20/19   Aline August, MD  cetirizine (ZYRTEC) 10 MG tablet Take 10 mg by mouth daily as needed for allergies or rhinitis.    [provider]  chlorpheniramine-HYDROcodone (TUSSIONEX) 10-8 MG/5ML SUER Take 5 mLs by mouth daily as needed. 03/22/19   [provider]  cholecalciferol (VITAMIN D) 25 MCG (1000 UNIT) tablet Take 4,000 Units by mouth every evening.     [provider]  Cyanocobalamin (B-12 PO) Take 1 tablet by mouth every other day.     [provider]  enalapril (VASOTEC) 10 MG tablet Take 10 mg by mouth daily. 06/04/16   [provider]  famotidine (PEPCID) 40 MG tablet Take 40 mg by mouth 2 (two) times daily.  12/28/18   [provider]  guaiFENesin-dextromethorphan (ROBITUSSIN DM) 100-10 MG/5ML syrup Take 10 mLs by mouth every 4 (four) hours as needed for cough. Patient not taking: Reported on 06/05/2019 02/20/19   Aline August, MD  mirtazapine (REMERON) 30 MG tablet Take 1 tablet (30 mg total) by mouth at bedtime. 05/09/19 05/08/20  Arfeen, Arlyce Harman, MD  multivitamin (ONE-A-DAY MEN'S) TABS tablet Take 1 tablet by mouth daily.    [provider]  nicotine (NICODERM CQ - DOSED IN MG/24 HOURS) 14 mg/24hr patch Place 1 patch (14 mg total) onto the skin daily. 02/20/19   Aline August, MD  omeprazole (PRILOSEC) 20 MG capsule Take 1 capsule (20 mg total) by mouth daily. 03/14/19   Domenic Moras, PA-C  OXYGEN Inhale 2 L/min into the lungs continuous.     [provider]  polyethylene glycol (MIRALAX / GLYCOLAX) 17 g packet Take 17 g by mouth daily as needed for mild constipation.    [provider]  predniSONE (DELTASONE) 20 MG tablet Take 2 tablets (40 mg total) by mouth daily with breakfast. 06/07/19   [name], MD  risperidone (RISPERDAL) 4 MG tablet Take 1 tablet (4 mg total) by mouth 2 (two) times daily. 05/09/19   Arfeen, Arlyce Harman, MD  traZODone (DESYREL) 50 MG tablet Take 1 tablet (50 mg total) by mouth at bedtime as needed for sleep. 05/09/19   Arfeen, Arlyce Harman, MD    Allergies    Patient has no known allergies.  Review of Systems   Review of Systems  Constitutional: Negative for chills, diaphoresis and fever.  Respiratory: Positive for shortness of breath. Negative for cough.   Cardiovascular: Positive for chest pain. Negative for leg swelling.  Gastrointestinal: Negative for abdominal pain, diarrhea, nausea and vomiting.  Genitourinary: Negative for dysuria, frequency and hematuria.  Musculoskeletal:  Negative for back pain.  Neurological: Negative for syncope.  All other systems reviewed and are negative.   Physical Exam Updated Vital Signs BP (!) 162/75   Pulse (!) 122   Temp (!) 97.5 F (36.4 C) (Oral)   Resp (!) 25   SpO2 96%   Physical Exam Vitals and nursing note reviewed.  Constitutional:      General: He is not in acute distress.    Appearance: He is well-developed. He is not diaphoretic.  HENT:     Head: Normocephalic and atraumatic.     Mouth/Throat:     Mouth: Mucous membranes are moist.     Pharynx: Oropharynx is clear.  Eyes:     Conjunctiva/sclera: Conjunctivae normal.  Cardiovascular:     Rate and Rhythm: Regular rhythm. Tachycardia present.     Pulses: Normal pulses.          Radial pulses are 2+ on the right side and 2+ on the left side.       Posterior tibial pulses are 2+ on the right side and 2+ on the left side.     Heart sounds: Normal heart sounds.     Comments: Tactile temperature in the extremities appropriate and equal bilaterally. Pulmonary:     Effort: Tachypnea and respiratory distress present.     Breath  sounds: Decreased breath sounds and wheezing present.  Abdominal:     Palpations: Abdomen is soft.     Tenderness: There is no abdominal tenderness. There is no guarding.  Musculoskeletal:     Cervical back: Neck supple.     Right lower leg: No edema.     Left lower leg: No edema.  Lymphadenopathy:     Cervical: No cervical adenopathy.  Skin:    General: Skin is warm and dry.  Neurological:     Mental Status: He is alert.     Comments: Patient is able to tell me he is at [facility].  He does not know exactly why he is here.  He voices the date as July 1924.  Psychiatric:        Mood and Affect: Mood and affect normal.        Speech: Speech normal.        Behavior: Behavior normal.     ED Results / Procedures / Treatments   Labs (all labs ordered are listed, but only abnormal results are displayed) Labs Reviewed  CBC WITH DIFFERENTIAL/PLATELET - Abnormal; Notable for the following components:      Result Value   WBC 11.2 (*)    Eosinophils Absolute 0.7 (*)    All other components within normal limits  COMPREHENSIVE METABOLIC PANEL - Abnormal; Notable for the following components:   Chloride 96 (*)    Glucose, Bld 142 (*)    All other components within normal limits  CBG MONITORING, ED - Abnormal; Notable for the following components:   Glucose-Capillary 129 (*)    All other components within normal limits  SARS CORONAVIRUS 2 (TAT 6-24 HRS)  BRAIN NATRIURETIC PEPTIDE  LIPASE, BLOOD  ETHANOL  AMMONIA  POC SARS CORONAVIRUS 2 AG -  ED  I-STAT ARTERIAL BLOOD GAS, ED  TROPONIN I (HIGH SENSITIVITY)  TROPONIN I (HIGH SENSITIVITY)    EKG EKG Interpretation  Date/Time:  Monday June 20 2019 12:51:52 EDT Ventricular Rate:  118 PR Interval:    QRS Duration: 73 QT Interval:  321 QTC Calculation: 450 R Axis:   110 Text Interpretation: Sinus tachycardia Consider  right atrial enlargement Right axis deviation Probable anteroseptal infarct, old Abnormal T, consider  ischemia, lateral leads Baseline wander in lead(s) V6 when comapred to prior, faste rate. No STEMI Confirmed by Antony Blackbird [phone]) on 06/20/2019 1:51:55 PM   Radiology CT Angio Chest PE W and/or Wo Contrast  Result Date: 06/20/2019 CLINICAL DATA:  Shortness of breath, chest pain. EXAM: CT ANGIOGRAPHY CHEST WITH CONTRAST TECHNIQUE: Multidetector CT imaging of the chest was performed using the standard protocol during bolus administration of intravenous contrast. Multiplanar CT image reconstructions and MIPs were obtained to evaluate the vascular anatomy. CONTRAST:  9mL OMNIPAQUE IOHEXOL 350 MG/ML SOLN COMPARISON:  May 25, 2016. FINDINGS: Cardiovascular: Satisfactory opacification of the pulmonary arteries to the segmental level. No evidence of pulmonary embolism. Normal heart size. No pericardial effusion. Atherosclerosis of thoracic aorta is noted without aneurysm formation. Mediastinum/Nodes: No enlarged mediastinal, hilar, or axillary lymph nodes. Thyroid gland, trachea, and esophagus demonstrate no significant findings. Lungs/Pleura: No pneumothorax or pleural effusion is noted. Severe emphysematous disease and bulla formation is noted throughout both lungs. Upper Abdomen: No acute abnormality. Musculoskeletal: No chest wall abnormality. No acute or significant osseous findings. Review of the MIP images confirms the above findings. IMPRESSION: No definite evidence of pulmonary embolus. No acute abnormality seen in the chest. Aortic Atherosclerosis (ICD10-I70.0). Aortic Atherosclerosis (ICD10-I70.0) and Emphysema (ICD10-J43.9). Electronically Signed   By: Marijo Conception M.D.   On: 06/20/2019 14:37   DG Chest Port 1 View  Result Date: 06/20/2019 CLINICAL DATA:  Shortness of breath and chest pain EXAM: PORTABLE CHEST 1 VIEW COMPARISON:  June 05, 2019 FINDINGS: The heart size and mediastinal contours are unchanged. Again noted are extensive centrilobular emphysematous changes throughout. No new  airspace consolidation or pleural effusion. Architectural distortion and chronic interstitial markings are again noted at the right lung base. No acute osseous findings. IMPRESSION: No active disease.  Extensive findings of COPD. Electronically Signed   By: Prudencio Pair M.D.   On: 06/20/2019 13:38    Procedures .Critical Care Performed by: Lorayne Bender, PA-C Authorized by: Lorayne Bender, PA-C   Critical care provider statement:    Critical care time (minutes):  35   Critical care time was exclusive of:  Separately billable procedures and treating other patients   Critical care was necessary to treat or prevent imminent or life-threatening deterioration of the following conditions:  Respiratory failure   Critical care was time spent personally by me on the following activities:  Ordering and performing treatments and interventions, ordering and review of laboratory studies, ordering and review of radiographic studies, pulse oximetry, re-evaluation of patient's condition, review of old charts, obtaining history from patient or surrogate, discussions with consultants, development of treatment plan with patient or surrogate, evaluation of patient's response to treatment and examination of patient   I assumed direction of critical care for this patient from another provider in my specialty: no     (including critical care time)  Medications Ordered in ED Medications  albuterol (VENTOLIN HFA) 108 (90 Base) MCG/ACT inhaler 4 puff (4 puffs Inhalation Given 06/20/19 1327)  ipratropium (ATROVENT HFA) inhaler 2 puff (2 puffs Inhalation Given 06/20/19 1328)  iohexol (OMNIPAQUE) 350 MG/ML injection 75 mL (75 mLs Intravenous Contrast Given 06/20/19 1426)  albuterol (VENTOLIN HFA) 108 (90 Base) MCG/ACT inhaler 4 puff (4 puffs Inhalation Given 06/20/19 1621)    ED Course  I have reviewed the triage vital signs and the nursing notes.  Pertinent labs & imaging results that were  available during my care of the  patient were reviewed by me and considered in my medical decision making (see chart for details).  Clinical Course as of Jun 19 1701  Mon Jun 20, 2019  1440 Patient reevaluated.  States his shortness of breath has improved somewhat, but he still does feel short of breath. Chest pain has resolved.  Denies additional complaints.   [SJ]  1640 RN reports patient SPO2 dropped to 89% with even mild exertion and a few steps of ambulation.  Patient became quite tachypneic and had increased work of breathing.   [SJ]    Clinical Course User Index [SJ] Juell Radney, Helane Gunther, PA-C   MDM Rules/Calculators/A&P                      Patient presents with shortness of breath and chest pain.  Increased work of breathing, tachypnea, tachycardia, wheezing on exam.  Hypoxic down to 89% prior to arrival. Due to the patient's tachycardia, respiratory distress, and sudden onset chest pain, PE was kept high on the differential, though COPD exasperation is also a possibility.  Mild leukocytosis.  Mild hypochloremia of uncertain significance at this time. BNP not elevated and no pulmonary edema on chest x-ray, low suspicion for fluid overload or heart failure. Delta troponins negative.  No significant EKG changes.  This lowers suspicion for ACS. POC Covid test negative. No acute abnormalities noted on chest x-ray chest CT. Patient continued to have shortness of breath, tachypnea, increased work of breathing despite interventions delivered by EMS and here in the ED.  Hypoxia and even more increased work of breathing with ambulation. Admission for suspected COPD exacerbation.   End of shift patient care handoff report given to Krista Blue, PA-C. Plan: Admit patient.   MDM Number of Diagnoses or Management Options Hypoxia: new, needed workup Shortness of breath: new, needed workup   Amount and/or Complexity of Data Reviewed Clinical lab tests: ordered and reviewed Tests in the radiology section of CPT: ordered  and reviewed Decide to obtain previous medical records or to obtain history from someone other than the patient: yes Obtain history from someone other than the patient: yes Review and summarize past medical records: yes Discuss the patient with other providers: yes Independent visualization of images, tracings, or specimens: yes  Risk of Complications, Morbidity, and/or Mortality Presenting problems: high Management options: high  Critical Care Total time providing critical care: 30-74 minutes  Patient Progress Patient progress: improved   Vitals:   06/20/19 1358 06/20/19 1401 06/20/19 1430 06/20/19 1445  BP: 101/61 123/84 138/84 122/85  Pulse: (!) 112 (!) 111 (!) 101 (!) 105  Resp: (!) 23 (!) 23 18 (!) 22  Temp:      TempSrc:      SpO2: 98% 98% 97% 98%     Final Clinical Impression(s) / ED Diagnoses Final diagnoses:  Shortness of breath  Hypoxia    Rx / DC Orders ED Discharge Orders    None       Layla Maw 06/20/19 1708    Tegeler, Gwenyth Allegra, MD 06/21/19 [phone]

## 2019-06-20 NOTE — ED Triage Notes (Addendum)
Pt bib ems reporting that the pt's family member called ems for shortness of breath. Pt has hxt of copd. 88%o2 on ra. Pt arrived with 10L nonrebreather with sats 100%. 2g mag given and 125 solumedrol.  118hr  rr28 148/88 99*f

## 2019-06-20 NOTE — ED Notes (Signed)
Pt attempted to ambulated to the door and back without oxygen. Pt's labor of breathing increased dramatically and pt's o2 saturation went down to 89%.

## 2019-06-20 NOTE — ED Notes (Signed)
Pt went to c-t 

## 2019-06-20 NOTE — H&P (Addendum)
Date: 06/20/2019               Patient Name:  Blake Mcknight MRN: [id number]  DOB: [birth year] Age / Sex: 64 y.o., male   PCP: [name], MD         Medical Service: Internal Medicine Teaching Service         Attending Physician: Dr. Velna Ochs, MD    First Contact: Marva Panda, MD, [place] Pager: [place] [phone])  Second Contact: Myrtie Hawk, MD, [place] Pager: EM [phone])       After Hours (After 5p/  First Contact Pager: [phone]  weekends / holidays): Second Contact Pager: [phone]   Chief Complaint: shortness of breath  History of Present Illness: 64 y.o. yo male w/ PMH significant for COPD on 2L O2 via [place] at home, tobacco use disorder, hyperlipidemia, hypertension and schizophrenia presenting with shortness of breath and chest pain. History obtained by patient and wife at bedside. Per wife, patient has had decline in functional status over past several months and has become mostly dependent in his ADLs. This morning, patient noted sudden onset shortness of breath with central chest pain that he described as "pushing" sensation that was nonradiating. Patient has a productive cough at baseline. Per wife, he has had increasing sputum production that was yellow/gray yesterday. Patient attempted to use his albuterol inhaler without any significant relief. He also reports epigastric pain and nausea with decreased appetite. Per wife, patient has intermittent painless hematuria. He denies any fevers/chills, recent sick contacts, dysuria, urinary frequency or urgency or diarrhea.  Of note, patient has had multiple admissions over the past 6 months for COPD exacerbation. Last admission on 3/21 for COPD exacerbation at which he was discharged to complete five day course of prednisone and follow up with pulmonologist for medication optimization and repeat PFTs. Per wife, patient is supposed to be started on montelukast but he has not done so yet.   ED course:  Patient presenting via  EMS with shortness of breath and chest pain at rest noted to be hypoxic to 89% on scene. He received magnesium, solumedrol and albuterol. Continues to be tachycardic, tachypneic and hypoxic. CXR without acute findings. No significant changes noted on EKG and delta troponins negative. COVID test negative. CT Chest negative for PE. However, patient continues to be hypoxic with increased work of breathing with ambulation. Patient admitted to internal medicine for suspected COPD exacerbation.   Meds:  Carvedilol 3.125mg  bid Cetirizine 10mg  daily prn Advair HFA 115-68mcg/ACT 1 puff bid   Albuterol ventolin inhaler 2 puff q4h prn Atorvastatin 10mg  q6pm Benztropine 2mg  bid Enalapril 10mg  qd Famotidine 40mg  bid Mirtazapine 30mg  qHS Nicotine patch 14mg  qd Omeprazole 20mg  qd Risperidone 4mg  bid Trazodone 50mg  qHs prn    Allergies: Allergies as of 06/20/2019   (No Known Allergies)   Past Medical History: Past Medical History:  Diagnosis Date   COPD (chronic obstructive pulmonary disease) (HCC)    with bullous emphysema.    Heavy cigarette smoker before 2003   pt claims only 10 cigs per day, never heavier amounts.    HLD (hyperlipidemia)    Hypertension    Schizophrenia ([place]) 06/28/2013   This is a chronic condition and he lives with family   Family History:  Family History  Problem Relation Age of Onset   Diabetes Mellitus II Mother    Diabetes Mother    Heart disease Father    Stroke Maternal Aunt    CAD Neg Hx  Social History:  Social History   Tobacco Use   Smoking status: Current Every Day Smoker    Packs/day: 1.00    Years: 41.00    Pack years: 41.00    Types: Cigarettes   Smokeless tobacco: Never Used   Tobacco comment: Gets 4 puffs a day.   Substance Use Topics   Alcohol use: No   Drug use: No   Patient lives at home with wife and grandson. His wife is primary care taker and he also has home Doctor, general practice. He is mostly dependent in his ADLs due to worsening of his  COPD. Patient has 40 pack year smoking history and continues to smoke 1 puff per week. He denies any alcohol or illicit drug use.   Review of Systems: A complete ROS was negative except as per HPI.   Physical Exam: Blood pressure (!) 136/95, pulse (!) 105, temperature (!) 97.5 F (36.4 C), temperature source Oral, resp. rate (!) 21, SpO2 99 %. Physical Exam  Constitutional: He is well-developed, well-nourished, and in no distress. Vital signs are normal. He appears not lethargic. He appears unhealthy.  Non-toxic appearance. No distress.  Chronically ill appearing male lying comfortably in bed, NAD   HENT:  Head: Normocephalic and atraumatic.  Mouth/Throat: Oropharynx is clear and moist. No oropharyngeal exudate.  Eyes: Conjunctivae and EOM are normal. No scleral icterus.  Cardiovascular: Normal rate, regular rhythm, normal heart sounds and intact distal pulses. Exam reveals no friction rub.  No murmur heard. Pulmonary/Chest: Effort normal. No respiratory distress. He has wheezes. He has no rales.  Diffuse inspiratory and expiratory wheezing with limited air movement at bilateral apices   Abdominal: Soft. Bowel sounds are normal. He exhibits no distension. There is no abdominal tenderness. There is no rebound and no guarding.  Musculoskeletal:        General: No tenderness, deformity or edema. Normal range of motion.     Cervical back: Normal range of motion and neck supple.  Neurological: He is alert. He appears not lethargic. No cranial nerve deficit. Coordination normal.  Oriented to self, location, situation and month but notes year as 1942  Skin: Skin is warm and dry. He is not diaphoretic.  Psychiatric:  Appears to be responding to internal stimuli; denies any suicidal or homicidal ideation - pleasantly hallucinating   CBC Latest Ref Rng & Units 06/20/2019 06/20/2019 06/05/2019  WBC 4.0 - 10.5 K/uL - 11.2(H) 10.2  Hemoglobin 13.0 - 17.0 g/dL 16.7 16.0 14.6  Hematocrit 39.0 - 52.0 %  49.0 47.7 42.6  Platelets 150 - 400 K/uL - 289 254   CMP Latest Ref Rng & Units 06/20/2019 06/20/2019 06/05/2019  Glucose 70 - 99 mg/dL - 142(H) 133(H)  BUN 8 - 23 mg/dL - 12 6(L)  Creatinine 0.61 - 1.24 mg/dL - 0.85 0.63  Sodium 135 - 145 mmol/L 131(L) 135 135  Potassium 3.5 - 5.1 mmol/L 4.7 4.3 4.3  Chloride 98 - 111 mmol/L - 96(L) 99  CO2 22 - 32 mmol/L - 26 25  Calcium 8.9 - 10.3 mg/dL - 9.3 9.3  Total Protein 6.5 - 8.1 g/dL - 7.4 6.6  Total Bilirubin 0.3 - 1.2 mg/dL - 1.1 0.7  Alkaline Phos 38 - 126 U/L - 47 49  AST 15 - 41 U/L - 18 18  ALT 0 - 44 U/L - 23 22   BNP (last 3 results) Recent Labs    06/05/19 1404 06/20/19 1259  BNP 11.7 18.3    EKG: personally  reviewed my interpretation is sinus tachycardia without ST segment elevations noted; some T wave changes in lateral leads but inconsistent; QTc 466ms  CXR: personally reviewed my interpretation is no airspace consolidation or pleural effusions; hyperinflated lungs consistent with extensive COPD  CTA CHEST PE W CONTRAST: IMPRESSION: No definite evidence of pulmonary embolus. No acute abnormality seen in the chest.  CT HEAD WO CONTRAST: IMPRESSION: No acute intracranial pathology.  Assessment & Plan by Problem: Mr. Berat Heavey is a 63 year old male with PMHx of COPD on 2L O2, hypertension, schizophrenia, hyperlipidemia and tobacco use disorder presenting with acute onset dyspnea on exertion with chest pain concerning for COPD exacerbation.  Acute on chronic hypoxic respiratory failure 2/2 COPD Exacerbation:  Patient presented with acute onset shortness of breath and non-radiating chest pain. He has also had worsening productive cough.He was noted to be hypoxic to 89% on EMS arrival and received albuterol, solumedrol, magnesium without significant relief. EKG without significant changes and troponins negative. CXR with hyperinflated lungs without obvious signs of consolidation and CTA Chest negative for PE. Patient  attempted to ambulate with RN with increased work of breathing and hypoxic with ambulation. On examination, patient tachycardic but does not appear to be in acute respiratory distress. He is saturating 96% on 4-6L . Diffuse inspiratory and expiratory wheezing noted with decreased air movement at bilateral apices. He does have mild leukocytosis on labs, ABG does not appear to be significantly acidotic.   - Albuterol 2.5mg  neb q2h prn  - Breo Ellipta 1 puff qd - Ceftriaxone 1g q24h IV - Azithromycin 250mg  qd - Supplemental O2 for goal SpO2 88-92% - Incentive spirometry q4h while awake  - PT/OT evaluation  - Cardiac monitoring   Hematuria: Patient with several months of painless hematuria. Per wife, patient has had blood in his urine recently. She notes that he intermittently reports dysuria; but otherwise does not have any other urinary symptoms.  - Urinalysis - Will likely need outpatient urology referral  - Continue to monitor   Schizophrenia: Patient with history of schizophrenia on risperidone, mirtazapine, and benztropine. He does have ongoing auditory hallucinations. He is alert and oriented to self, place and situation but not time. He is responding to internal stimuli. However, denies any suicidal or homicidal ideation. - Mirtazapine 30mg  qHs - Risperidone 4mg  bid - Benztropine 2mg  bid  - Trazodone 50mg  qHS prn   Hypertension: Patient with history of hypertension on enalapril at home. SBP 130-150s on admission. - Continue enalapril 10mg  daily  Hyperlipidemia: - Atorvastatin 10mg  q6PM  GERD: Patient is on PPI and H2 blocker for GERD. He reports some ongoing substernal heart burn.  - Pepcid 20mg  bid - Pantoprazole 40mg  daily   Tobacco use disorder:  Patient with 40+ pack year smoking history. Per wife, he continues to have cravings and will have 1 puff of cigarette per week. She notes that he requires nicotine patch.  - Nicotine 14mg  patch q24h  FEN/GI: Diet: Heart  healthy Electrolytes: Monitor and replete prn Fluids: None  GI regimen:Miralax 17g daily prn   DVT Prophylaxis: Lovenox  Code status: DNR/DNI  Dispo: Admit patient to Inpatient with expected length of stay greater than 2 midnights.  Signed: Harvie Heck, MD  Internal Medicine, PGY-1 06/20/2019, 7:15 PM  Pager: [phone]

## 2019-06-21 DIAGNOSIS — Z9981 Dependence on supplemental oxygen: Secondary | ICD-10-CM | POA: Diagnosis not present

## 2019-06-21 DIAGNOSIS — I10 Essential (primary) hypertension: Secondary | ICD-10-CM

## 2019-06-21 DIAGNOSIS — Z833 Family history of diabetes mellitus: Secondary | ICD-10-CM | POA: Diagnosis not present

## 2019-06-21 DIAGNOSIS — J439 Emphysema, unspecified: Secondary | ICD-10-CM | POA: Diagnosis present

## 2019-06-21 DIAGNOSIS — K219 Gastro-esophageal reflux disease without esophagitis: Secondary | ICD-10-CM | POA: Diagnosis present

## 2019-06-21 DIAGNOSIS — E785 Hyperlipidemia, unspecified: Secondary | ICD-10-CM | POA: Diagnosis present

## 2019-06-21 DIAGNOSIS — Z7951 Long term (current) use of inhaled steroids: Secondary | ICD-10-CM

## 2019-06-21 DIAGNOSIS — J441 Chronic obstructive pulmonary disease with (acute) exacerbation: Secondary | ICD-10-CM | POA: Diagnosis not present

## 2019-06-21 DIAGNOSIS — Z8249 Family history of ischemic heart disease and other diseases of the circulatory system: Secondary | ICD-10-CM | POA: Diagnosis not present

## 2019-06-21 DIAGNOSIS — Z720 Tobacco use: Secondary | ICD-10-CM | POA: Diagnosis not present

## 2019-06-21 DIAGNOSIS — F17213 Nicotine dependence, cigarettes, with withdrawal: Secondary | ICD-10-CM | POA: Diagnosis present

## 2019-06-21 DIAGNOSIS — R319 Hematuria, unspecified: Secondary | ICD-10-CM

## 2019-06-21 DIAGNOSIS — Z20822 Contact with and (suspected) exposure to covid-19: Secondary | ICD-10-CM | POA: Diagnosis present

## 2019-06-21 DIAGNOSIS — Z66 Do not resuscitate: Secondary | ICD-10-CM | POA: Diagnosis present

## 2019-06-21 DIAGNOSIS — Z79899 Other long term (current) drug therapy: Secondary | ICD-10-CM

## 2019-06-21 DIAGNOSIS — J9620 Acute and chronic respiratory failure, unspecified whether with hypoxia or hypercapnia: Secondary | ICD-10-CM | POA: Diagnosis not present

## 2019-06-21 DIAGNOSIS — F209 Schizophrenia, unspecified: Secondary | ICD-10-CM | POA: Diagnosis not present

## 2019-06-21 DIAGNOSIS — J9621 Acute and chronic respiratory failure with hypoxia: Secondary | ICD-10-CM | POA: Diagnosis present

## 2019-06-21 DIAGNOSIS — Z823 Family history of stroke: Secondary | ICD-10-CM | POA: Diagnosis not present

## 2019-06-21 LAB — BASIC METABOLIC PANEL
Anion gap: 12 (ref 5–15)
BUN: 10 mg/dL (ref 8–23)
CO2: 26 mmol/L (ref 22–32)
Calcium: 8.8 mg/dL — ABNORMAL LOW (ref 8.9–10.3)
Chloride: 97 mmol/L — ABNORMAL LOW (ref 98–111)
Creatinine, Ser: 0.85 mg/dL (ref 0.61–1.24)
GFR calc Af Amer: >60 mL/min (ref >60)
GFR calc non Af Amer: >60 mL/min (ref >60)
Glucose, Bld: 131 mg/dL — ABNORMAL HIGH (ref 70–99)
Potassium: 4.6 mmol/L (ref 3.5–5.1)
Sodium: 135 mmol/L (ref 135–145)

## 2019-06-21 LAB — URINALYSIS, ROUTINE W REFLEX MICROSCOPIC
Bilirubin Urine: NEGATIVE
Glucose, UA: 50 mg/dL — AB
Hgb urine dipstick: NEGATIVE
Ketones, ur: 5 mg/dL — AB
Leukocytes,Ua: NEGATIVE
Nitrite: NEGATIVE
Protein, ur: NEGATIVE mg/dL
Specific Gravity, Urine: 1.019 (ref 1.005–1.030)
pH: 6 (ref 5.0–8.0)

## 2019-06-21 LAB — CBC
HCT: 45.4 % (ref 39.0–52.0)
Hemoglobin: 15.1 g/dL (ref 13.0–17.0)
MCH: 30.4 pg (ref 26.0–34.0)
MCHC: 33.3 g/dL (ref 30.0–36.0)
MCV: 91.5 fL (ref 80.0–100.0)
Platelets: 272 10*3/uL (ref 150–400)
RBC: 4.96 MIL/uL (ref 4.22–5.81)
RDW: 11.9 % (ref 11.5–15.5)
WBC: 9.6 10*3/uL (ref 4.0–10.5)
nRBC: 0 % (ref 0.0–0.2)

## 2019-06-21 LAB — HIV ANTIBODY (ROUTINE TESTING W REFLEX): HIV Screen 4th Generation wRfx: NONREACTIVE

## 2019-06-21 MED ORDER — IPRATROPIUM-ALBUTEROL 0.5-2.5 (3) MG/3ML IN SOLN
3.0000 mL | RESPIRATORY_TRACT | Status: DC
  Administered 2019-06-21 – 2019-06-22 (×4): 3 mL via RESPIRATORY_TRACT
  Filled 2019-06-21 (×4): qty 3

## 2019-06-21 MED ORDER — PREDNISONE 20 MG PO TABS
40.0000 mg | ORAL_TABLET | Freq: Every day | ORAL | Status: DC
  Administered 2019-06-21 – 2019-06-23 (×3): 40 mg via ORAL
  Filled 2019-06-21 (×3): qty 2

## 2019-06-21 MED ORDER — ALBUTEROL SULFATE (2.5 MG/3ML) 0.083% IN NEBU: 2.5000 mg | INHALATION_SOLUTION | RESPIRATORY_TRACT | Status: DC | PRN

## 2019-06-21 MED ORDER — ENSURE ENLIVE PO LIQD
237.0000 mL | Freq: Three times a day (TID) | ORAL | Status: DC
  Administered 2019-06-21 – 2019-06-23 (×6): 237 mL via ORAL

## 2019-06-21 MED ORDER — ADULT MULTIVITAMIN W/MINERALS CH
1.0000 | ORAL_TABLET | Freq: Every day | ORAL | Status: DC
  Administered 2019-06-21 – 2019-06-23 (×3): 1 via ORAL
  Filled 2019-06-21 (×3): qty 1

## 2019-06-21 MED ORDER — ALBUTEROL SULFATE (2.5 MG/3ML) 0.083% IN NEBU: 2.5000 mg | INHALATION_SOLUTION | RESPIRATORY_TRACT | Status: DC

## 2019-06-21 NOTE — Progress Notes (Signed)
Subjective: HD#1 Overnight, no acute events reported.  This morning, patient evaluated at bedside. He is resting comfortably in bed and notes improvement with his breathing.  Objective:  Vital signs in last 24 hours: Vitals:   06/20/19 2210 06/20/19 2346 06/21/19 0029 06/21/19 0437  BP: 131/84 128/77  122/85  Pulse: 95 (!) 103  92  Resp: 15 18  (!) 23  Temp: 98.6 F (37 C)   97.7 F (36.5 C)  TempSrc: Oral     SpO2: 98% 98%  99%  Weight: 64.4 kg  64.5 kg   Height: 5\' 5"  (1.651 m)      CBC Latest Ref Rng & Units 06/21/2019 06/20/2019 06/20/2019  WBC 4.0 - 10.5 K/uL 9.6 - 11.2(H)  Hemoglobin 13.0 - 17.0 g/dL 15.1 16.7 16.0  Hematocrit 39.0 - 52.0 % 45.4 49.0 47.7  Platelets 150 - 400 K/uL 272 - 289   BMP Latest Ref Rng & Units 06/21/2019 06/20/2019 06/20/2019  Glucose 70 - 99 mg/dL 131(H) - 142(H)  BUN 8 - 23 mg/dL 10 - 12  Creatinine 0.61 - 1.24 mg/dL 0.85 - 0.85  Sodium 135 - 145 mmol/L 135 131(L) 135  Potassium 3.5 - 5.1 mmol/L 4.6 4.7 4.3  Chloride 98 - 111 mmol/L 97(L) - 96(L)  CO2 22 - 32 mmol/L 26 - 26  Calcium 8.9 - 10.3 mg/dL 8.8(L) - 9.3   Urinalysis    Component Value Date/Time   COLORURINE YELLOW 06/21/2019 0010   APPEARANCEUR CLEAR 06/21/2019 0010   LABSPEC 1.019 06/21/2019 0010   PHURINE 6.0 06/21/2019 0010   GLUCOSEU 50 (A) 06/21/2019 0010   HGBUR NEGATIVE 06/21/2019 0010   BILIRUBINUR NEGATIVE 06/21/2019 0010   KETONESUR 5 (A) 06/21/2019 0010   PROTEINUR NEGATIVE 06/21/2019 0010   UROBILINOGEN 0.2 01/29/2019 1634   NITRITE NEGATIVE 06/21/2019 0010   LEUKOCYTESUR NEGATIVE 06/21/2019 0010   Physical Exam  Constitutional: He appears not cachectic. No distress.  Chronically ill appearing male lying comfortably in bed, on 2L [place]. NAD  Cardiovascular: Normal rate, regular rhythm, normal heart sounds and intact distal pulses.  No murmur heard. Pulmonary/Chest: Tachypnea noted. No respiratory distress. He has wheezes.  Expiratory wheezing in RLL  Abdominal:  Soft. Normal appearance and bowel sounds are normal. There is no abdominal tenderness.  Neurological: He is alert.  Skin: He is not diaphoretic.  Psychiatric:  Patient pleasantly hallucinating    Assessment/Plan: Mr. Blake Mcknight is a 64 year old male with PMHx of COPD on 2L O2, hypertension, schizophrenia, hyperlipidemia and tobacco use disorder presenting with acute onset dyspnea on exertion with chest pain concerning for COPD exacerbation.  COPD exacerbation:  Patient presented with acute onset dyspnea on exertion with chest pain and has received albuterol, solumedrol and magnesium sulfate. This morning, patient continues to be tachypneic at rest with increased work of breathing at rest. He is maintaining oxygen saturations on 2L O2 via [place]. Expiratory wheezing noted on exam most significant in RLL. Leukocytosis resolved.  - Albuterol 2.5mg  neb q2h - Breo ellipta 1 puff qd - Prednisone 40mg  qd day 1/5 - Azithromycin 250mg  qd day 2 - IV Ceftriaxone 1g qd day 2 - Supplemental O2 for goal SpO2 88-92% - Cardiac monitoring - PT/OT evaluation  Hematuria: Per wife, patient noted to have blood in urine recently. UA negative for hemoglobinuria. Will defer this for outpatient urology work up.  - Continue to monitor   Schizophrenia: Patient with ongoing auditory hallucinations.  - Continue mirtazapine 30mg  qHs - Continue  Risperidone 4mg  bid - Continue benztropine 2mg  bid - Continue trazodone 50mg  qHS prn   FEN/GI: Diet: HH  Electrolytes: Monitor and replete prn Fluids:None   DVT Prophylaxis: Lovenox Code status: DNR/DNI  Prior to Admission Living Arrangement: Home Anticipated Discharge Location: Home w/HH vs SNF pending PT/OT recommendations  Barriers to Discharge: Ongoing medical evaluation and treatment  Dispo: Anticipated discharge in approximately 1-2 day(s).   Harvie Heck, MD  Internal Medicine, PGY-1 06/21/2019, 6:07 AM Pager: [phone]

## 2019-06-21 NOTE — Evaluation (Signed)
Physical Therapy Evaluation Patient Details Name: Blake Mcknight MRN: [id number] DOB: 1955/10/11 Today's Date: 06/21/2019   History of Present Illness  Pt is a 64 yo male admitted with COPD exacerbation. PMHx: schizophrenia, HTN, HLD, COPD.  Clinical Impression  Pt admitted with above diagnosis. Pt tolerated therapy well today.  He required use of O2 and ambulated 25'.  Pt required cues for safety (somewhat impulsive) and for breathing techniques with activity.  Pt currently with functional limitations due to the deficits listed below (see PT Problem List). Pt will benefit from skilled PT to increase their independence and safety with mobility to allow discharge to the venue listed below.       Follow Up Recommendations Home health PT(sister may decline HHPT - reports just wants nursing)    Equipment Recommendations  None recommended by PT    Recommendations for Other Services       Precautions / Restrictions        Mobility  Bed Mobility Overal bed mobility: Needs Assistance Bed Mobility: Supine to Sit;Sit to Supine     Supine to sit: Supervision Sit to supine: Supervision   General bed mobility comments: cues for safety  Transfers Overall transfer level: Needs assistance Equipment used: None Transfers: Sit to/from Stand Sit to Stand: Min guard         General transfer comment: min guard safety; cues to take his time - somewhat impulsive  Ambulation/Gait Ambulation/Gait assistance: Min guard Gait Distance (Feet): 25 Feet Assistive device: None Gait Pattern/deviations: Step-through pattern     General Gait Details: pt with quick gait speed - cued for control, safety, and breathing techniques  Stairs            Wheelchair Mobility    Modified Rankin (Stroke Patients Only)       Balance Overall balance assessment: Needs assistance   Sitting balance-Leahy Scale: Normal     Standing balance support: No upper extremity supported Standing  balance-Leahy Scale: Good                               Pertinent Vitals/Pain Pain Assessment: No/denies pain    Home Living Family/patient expects to be discharged to:: Private residence Living Arrangements: Other relatives(sister) Available Help at Discharge: Family;Available PRN/intermittently Type of Home: House Home Access: Stairs to enter Entrance Stairs-Rails: Right;Left;Can reach both Entrance Stairs-Number of Steps: 3 Home Layout: One level Home Equipment: Shower seat      Prior Function Level of Independence: Needs assistance   Gait / Transfers Assistance Needed: walks in house but limited distances; gets [facility]; can typically walk to bathroom  ADL's / Homemaking Assistance Needed: Pt performing grooming and feeding; sister assists with bathe/dress and IADLs  Comments: pt reports he often has difficulty breathing with mobility, which can take 10-15 minutes to recover. Pt on 2L [place] at home.     Hand Dominance        Extremity/Trunk Assessment   Upper Extremity Assessment Upper Extremity Assessment: Generalized weakness    Lower Extremity Assessment Lower Extremity Assessment: LLE deficits/detail;RLE deficits/detail RLE Deficits / Details: ROM WFL; MMT 4+/5 LLE Deficits / Details: ROM WFL; MMT 4+/5    Cervical / Trunk Assessment Cervical / Trunk Assessment: Normal  Communication   Communication: Expressive difficulties;Other (comment)(mumbling)  Cognition Arousal/Alertness: Awake/alert Behavior During Therapy: WFL for tasks assessed/performed Overall Cognitive Status: History of cognitive impairments - at baseline  General Comments: Pt required increased time to respond and questions to be repeated at times.  Followed simple commands with increased time/repetation.  Oriented to self and place. Sister present and reports that is near baseline.      General Comments General comments (skin integrity,  edema, etc.): Pt on 2 L O2 with sats >90% during PT.  Educated on energy conservation and breathing techniques - required repeated cues for pursed lip breathing and still had difficulty.    Exercises     Assessment/Plan    PT Assessment Patient needs continued PT services  PT Problem List Decreased strength;Decreased mobility;Decreased safety awareness;Decreased knowledge of precautions;Decreased activity tolerance;Cardiopulmonary status limiting activity;Decreased balance       PT Treatment Interventions DME instruction;Therapeutic activities;Cognitive remediation;Gait training;Therapeutic exercise;Patient/family education;Stair training;Balance training;Functional mobility training    PT Goals (Current goals can be found in the Care Plan section)  Acute Rehab PT Goals Patient Stated Goal: return home PT Goal Formulation: With patient/family Time For Goal Achievement: 07/05/19 Potential to Achieve Goals: Good    Frequency Min 3X/week   Barriers to discharge        Co-evaluation               AM-PAC PT "6 Clicks" Mobility  Outcome Measure Help needed turning from your back to your side while in a flat bed without using bedrails?: None Help needed moving from lying on your back to sitting on the side of a flat bed without using bedrails?: None Help needed moving to and from a bed to a chair (including a wheelchair)?: None Help needed standing up from a chair using your arms (e.g., wheelchair or bedside chair)?: None Help needed to walk in hospital room?: None Help needed climbing 3-5 steps with a railing? : A Little 6 Click Score: 23    End of Session Equipment Utilized During Treatment: Gait belt Activity Tolerance: Patient tolerated treatment well Patient left: in bed;with call bell/phone within reach;with bed alarm set;with family/visitor present Nurse Communication: Mobility status PT Visit Diagnosis: Other abnormalities of gait and mobility (R26.89)    Time:  [id number] PT Time Calculation (min) (ACUTE ONLY): 26 min   Charges:   PT Evaluation $PT Eval Moderate Complexity: 1 Mod PT Treatments $Gait Training: 8-22 mins        Maggie Font, PT Acute Rehab Services Pager [phone] [facility] [place] [phone]'   Blake Mcknight 06/21/2019, 4:58 PM

## 2019-06-21 NOTE — Progress Notes (Signed)
Initial Nutrition Assessment  DOCUMENTATION CODES:   Not applicable  INTERVENTION:   -Ensure Enlive po TID, each supplement provides 350 kcal and 20 grams of protein -MVI with minerals daily  NUTRITION DIAGNOSIS:   Increased nutrient needs related to chronic illness(COPD) as evidenced by estimated needs.  GOAL:   Patient will meet greater than or equal to 90% of their needs  MONITOR:   PO intake, Supplement acceptance, Labs, Weight trends, Skin, I & O's  REASON FOR ASSESSMENT:   Consult Assessment of nutrition requirement/status  ASSESSMENT:   64 y.o. yo male w/ PMH significant for COPD on 2L O2 via [place] at home, tobacco use disorder, hyperlipidemia, hypertension and schizophrenia presenting with shortness of breath and chest pain. History obtained by patient and wife at bedside. Per wife, patient has had decline in functional status over past several months and has become mostly dependent in his ADLs. This morning, patient noted sudden onset shortness of breath with central chest pain that he described as "pushing" sensation that was nonradiating. Patient has a productive cough at baseline. Per wife, he has had increasing sputum production that was yellow/gray yesterday. Patient attempted to use his albuterol inhaler without any significant relief. He also reports epigastric pain and nausea with decreased appetite. Per wife, patient has intermittent painless hematuria. He denies any fevers/chills, recent sick contacts, dysuria, urinary frequency or urgency or diarrhea.  Pt admitted with COPD exacerbation.   Reviewed I/O's: +94 ml x 24 hours  UOP: 450 ml x 24 hours  Attempted to speak with pt, however, in with MD at time of visit.   Reviewed wt hx; wt has been stable over the past year.   Noted very poor appetite; meal completion 0-5%. Pt would greatly benefit from addition of oral nutrition supplements.   Labs reviewed.   Diet Order:   Diet Order            Diet Heart  Room service appropriate? Yes; Fluid consistency: Thin  Diet effective now              EDUCATION NEEDS:   No education needs have been identified at this time  Skin:  Skin Assessment: Reviewed RN Assessment  Last BM:  06/21/19  Height:   Ht Readings from Last 1 Encounters:  06/20/19 5\' 5"  (1.651 m)    Weight:   Wt Readings from Last 1 Encounters:  06/21/19 64.5 kg    Ideal Body Weight:  61.8 kg  BMI:  Body mass index is 23.66 kg/m.  Estimated Nutritional Needs:   Kcal:  1900-2100  Protein:  95-110 grams  Fluid:  > 1.9 L    Loistine Chance, RD, LDN, [place] Registered Dietitian II Certified Diabetes Care and Education Specialist Please refer to [facility] for RD and/or RD on-call/weekend/after hours pager

## 2019-06-21 NOTE — Progress Notes (Addendum)
Patient arrived to the unit with sister at bedside.  Patient VSS. Placed on telemetry and verified with CCMD by NT.  Assessment questions answered with help of patient sister.

## 2019-06-21 NOTE — Evaluation (Signed)
Occupational Therapy Evaluation Patient Details Name: Blake Mcknight MRN: [id number] DOB: 07-24-1955 Today's Date: 06/21/2019    History of Present Illness Pt is a 64 yo male s/p SOB concerning for COPDe. PMHx: schizophrenia, HTN, HLD, COPD.   Clinical Impression   Pt PTA: Pt living with sister who assists with ADL and IADL tasks due to pt's chronic fatigue. Pt currently minA overall for LB ADL. Pt requiring seated rest breaks throughout session for ADL and mobility, only ambulating short distances.  2L O2 worn contiunuously for >90% O2; Energy conservation techniques provided. Pt reports that he is close to his baseline functioning. HHOT could benefit pt. Pt disoriented to time, but following simple commands throughout sessio with mumbled speech. Pt does not require skilled acute OT services. OT signing off.    Follow Up Recommendations  Home health OT    Equipment Recommendations  None recommended by OT    Recommendations for Other Services       Precautions / Restrictions Precautions Precautions: Fall Restrictions Weight Bearing Restrictions: No      Mobility Bed Mobility Overal bed mobility: Needs Assistance Bed Mobility: Supine to Sit;Sit to Supine     Supine to sit: Supervision Sit to supine: Supervision      Transfers Overall transfer level: Needs assistance Equipment used: None Transfers: Sit to/from Stand Sit to Stand: Supervision         General transfer comment: for safety    Balance Overall balance assessment: No apparent balance deficits (not formally assessed)                                         ADL either performed or assessed with clinical judgement   ADL Overall ADL's : At baseline                                       General ADL Comments: Pt can perform LB dressing, toilet hygiene and ADL tasks at sink in sitting. Pt requires seated rest breaks and dyspnea noted. O2 sats  >90% on 2L O2. Pt reports  this is his baseline of fatigue and O2 requirements.     Vision Baseline Vision/History: No visual deficits Patient Visual Report: No change from baseline Vision Assessment?: No apparent visual deficits     Perception     Praxis      Pertinent Vitals/Pain Pain Assessment: No/denies pain     Hand Dominance Right   Extremity/Trunk Assessment Upper Extremity Assessment Upper Extremity Assessment: Generalized weakness   Lower Extremity Assessment Lower Extremity Assessment: Generalized weakness   Cervical / Trunk Assessment Cervical / Trunk Assessment: Normal   Communication Communication Communication: Expressive difficulties;Other (comment)(mumbling)   Cognition Arousal/Alertness: Awake/alert Behavior During Therapy: WFL for tasks assessed/performed Overall Cognitive Status: No family/caregiver present to determine baseline cognitive functioning                                 General Comments: Pt A/O, disoriented to date. Pt following simple commands. Pt's voice was mumbled and often had to repeat answers. Unsure if this is baseline or not.   General Comments  2L O2 worn contiunuously for >90% O2; Energy conservation techniques provided.    Exercises     Shoulder Instructions  Home Living Family/patient expects to be discharged to:: Private residence Living Arrangements: Other relatives(sister) Available Help at Discharge: Family;Available PRN/intermittently Type of Home: House Home Access: Stairs to enter [employer] of Steps: 3 Entrance Stairs-Rails: Right;Left;Can reach both Home Layout: One level     Bathroom Shower/Tub: Teacher, early years/pre: Standard     Home Equipment: Civil engineer, contracting          Prior Functioning/Environment Level of Independence: Needs assistance  Gait / Transfers Assistance Needed: Walks short distances ADL's / Homemaking Assistance Needed: Pt performing grooming and feeding; sister  assists with bathe/dress and IADLs            OT Problem List: Decreased activity tolerance      OT Treatment/Interventions:      OT Goals(Current goals can be found in the care plan section)    OT Frequency:     Barriers to D/C:            Co-evaluation              AM-PAC OT "6 Clicks" Daily Activity     Outcome Measure Help from another person eating meals?: None Help from another person taking care of personal grooming?: A Little Help from another person toileting, which includes using toliet, bedpan, or urinal?: None Help from another person bathing (including washing, rinsing, drying)?: A Little Help from another person to put on and taking off regular upper body clothing?: None Help from another person to put on and taking off regular lower body clothing?: None 6 Click Score: 22   End of Session Equipment Utilized During Treatment: Oxygen Nurse Communication: Mobility status  Activity Tolerance: Patient tolerated treatment well Patient left: in bed;with call bell/phone within reach;with bed alarm set  OT Visit Diagnosis: Unsteadiness on feet (R26.81);Muscle weakness (generalized) (M62.81)                Time: [id number] OT Time Calculation (min): 25 min Charges:  OT General Charges $OT Visit: 1 Visit OT Evaluation $OT Eval Moderate Complexity: 1 Mod OT Treatments $Self Care/Home Management : 8-22 mins  Jefferey Pica, OTR/L Acute Rehabilitation Services Pager: [phone] Office: [phone]   Blake Mcknight  C 06/21/2019, 2:45 PM

## 2019-06-22 DIAGNOSIS — Z9981 Dependence on supplemental oxygen: Secondary | ICD-10-CM

## 2019-06-22 DIAGNOSIS — J9620 Acute and chronic respiratory failure, unspecified whether with hypoxia or hypercapnia: Secondary | ICD-10-CM

## 2019-06-22 DIAGNOSIS — Z66 Do not resuscitate: Secondary | ICD-10-CM

## 2019-06-22 LAB — COMPREHENSIVE METABOLIC PANEL
ALT: 21 U/L (ref 0–44)
AST: 15 U/L (ref 15–41)
Albumin: 3.2 g/dL — ABNORMAL LOW (ref 3.5–5.0)
Alkaline Phosphatase: 40 U/L (ref 38–126)
Anion gap: 10 (ref 5–15)
BUN: 8 mg/dL (ref 8–23)
CO2: 29 mmol/L (ref 22–32)
Calcium: 8.9 mg/dL (ref 8.9–10.3)
Chloride: 97 mmol/L — ABNORMAL LOW (ref 98–111)
Creatinine, Ser: 0.75 mg/dL (ref 0.61–1.24)
GFR calc Af Amer: >60 mL/min (ref >60)
GFR calc non Af Amer: >60 mL/min (ref >60)
Glucose, Bld: 137 mg/dL — ABNORMAL HIGH (ref 70–99)
Potassium: 3.8 mmol/L (ref 3.5–5.1)
Sodium: 136 mmol/L (ref 135–145)
Total Bilirubin: 0.5 mg/dL (ref 0.3–1.2)
Total Protein: 5.8 g/dL — ABNORMAL LOW (ref 6.5–8.1)

## 2019-06-22 LAB — CBC
HCT: 38.8 % — ABNORMAL LOW (ref 39.0–52.0)
Hemoglobin: 12.8 g/dL — ABNORMAL LOW (ref 13.0–17.0)
MCH: 30.5 pg (ref 26.0–34.0)
MCHC: 33 g/dL (ref 30.0–36.0)
MCV: 92.4 fL (ref 80.0–100.0)
Platelets: 251 10*3/uL (ref 150–400)
RBC: 4.2 MIL/uL — ABNORMAL LOW (ref 4.22–5.81)
RDW: 12.1 % (ref 11.5–15.5)
WBC: 11 10*3/uL — ABNORMAL HIGH (ref 4.0–10.5)
nRBC: 0 % (ref 0.0–0.2)

## 2019-06-22 MED ORDER — ALBUTEROL SULFATE (2.5 MG/3ML) 0.083% IN NEBU
2.5000 mg | INHALATION_SOLUTION | RESPIRATORY_TRACT | Status: DC
  Administered 2019-06-22 – 2019-06-23 (×8): 2.5 mg via RESPIRATORY_TRACT
  Filled 2019-06-22 (×8): qty 3

## 2019-06-22 NOTE — Progress Notes (Signed)
Patient pulled out both peripheral IV and states "I dont want them in". Mentioned to the patient that we needed IV access to give medication, patient said he was aware however refused in attempt to regain IV access. MD paged.

## 2019-06-22 NOTE — Progress Notes (Signed)
Subjective: HD#2 Overnight, no acute events reported.  This morning, patient evaluated at bedside. He is resting comfortably in bed and does not appear to be in distress. He reports improvement in breathing.   Objective:  Vital signs in last 24 hours: Vitals:   06/22/19 0354 06/22/19 0355 06/22/19 0356 06/22/19 0400  BP:  106/62    Pulse: 94 93 90   Resp: 15 15 14    Temp:  98.7 F (37.1 C)  98.2 F (36.8 C)  TempSrc:  Oral  Oral  SpO2: 98% 98% 98%   Weight:      Height:       CBC Latest Ref Rng & Units 06/22/2019 06/21/2019 06/20/2019  WBC 4.0 - 10.5 K/uL 11.0(H) 9.6 -  Hemoglobin 13.0 - 17.0 g/dL 12.8(L) 15.1 16.7  Hematocrit 39.0 - 52.0 % 38.8(L) 45.4 49.0  Platelets 150 - 400 K/uL 251 272 -   BMP Latest Ref Rng & Units 06/22/2019 06/21/2019 06/20/2019  Glucose 70 - 99 mg/dL 137(H) 131(H) -  BUN 8 - 23 mg/dL 8 10 -  Creatinine 0.61 - 1.24 mg/dL 0.75 0.85 -  Sodium 135 - 145 mmol/L 136 135 131(L)  Potassium 3.5 - 5.1 mmol/L 3.8 4.6 4.7  Chloride 98 - 111 mmol/L 97(L) 97(L) -  CO2 22 - 32 mmol/L 29 26 -  Calcium 8.9 - 10.3 mg/dL 8.9 8.8(L) -   Urinalysis    Component Value Date/Time   COLORURINE YELLOW 06/21/2019 0010   APPEARANCEUR CLEAR 06/21/2019 0010   LABSPEC 1.019 06/21/2019 0010   PHURINE 6.0 06/21/2019 0010   GLUCOSEU 50 (A) 06/21/2019 0010   HGBUR NEGATIVE 06/21/2019 0010   BILIRUBINUR NEGATIVE 06/21/2019 0010   KETONESUR 5 (A) 06/21/2019 0010   PROTEINUR NEGATIVE 06/21/2019 0010   UROBILINOGEN 0.2 01/29/2019 1634   NITRITE NEGATIVE 06/21/2019 0010   LEUKOCYTESUR NEGATIVE 06/21/2019 0010   Physical Exam  Constitutional: He appears not cachectic. No distress.  Chronically ill appearing male lying comfortably in bed, on 2L [place]. NAD  Cardiovascular: Normal rate, regular rhythm, normal heart sounds and intact distal pulses.  No murmur heard. Pulmonary/Chest: No respiratory distress. He has wheezes.  Diffuse end expiratory wheezing  Abdominal: Soft. Normal  appearance and bowel sounds are normal. There is no abdominal tenderness.  Neurological: He is alert.  Skin: Skin is warm and dry. He is not diaphoretic.   Assessment/Plan: Mr. Presley Goosman is a 64 year old male with PMHx of COPD on 2L O2, hypertension, schizophrenia, hyperlipidemia and tobacco use disorder presenting with acute onset dyspnea on exertion with chest pain concerning for COPD exacerbation.  Acute on chronic respiratory failure 2/2 COPD exacerbation:  Patient presented with acute onset dyspnea on exertion with chest pain and has received albuterol, solumedrol and magnesium sulfate. This morning, patient continues with diffuse end expiratory wheezing. He is maintaining oxygen saturations on 2L O2 via [place]. Does have a mild leukocytosis but likely secondary to steroid use.  Although patient would benefit from duonebs for COPD exacerbation, patient is on risperidone, mirtazapine, benztropine and ipratropium would enhance anticholinergic effects of these medications.  Would avoid duo nebs at this time. - Albuterol 2.5mg  neb q4h - Breo ellipta 1 puff qd - Prednisone 40mg  qd day 2/5 - Azithromycin 250mg  qd day 3 - Cardiac monitoring - PT/OT evaluation, recommending for [facility] PT/OT/RN  Schizophrenia: Patient with ongoing auditory hallucinations, unchanged from baseline.   - Continue mirtazapine 30mg  qHs - Continue Risperidone 4mg  bid - Continue benztropine 2mg  bid -  Continue trazodone 50mg  qHS prn   FEN/GI: Diet: HH  Electrolytes: Monitor and replete prn Fluids:None   DVT Prophylaxis: Lovenox Code status: DNR/DNI  Prior to Admission Living Arrangement: Home Anticipated Discharge Location: Home w/HH PT/OT/RN  Barriers to Discharge: Ongoing medical evaluation and treatment  Dispo: Anticipated discharge in approximately 1-2 day(s).   Harvie Heck, MD  Internal Medicine, PGY-1 06/22/2019, 6:44 AM Pager: [phone]

## 2019-06-22 NOTE — TOC Initial Note (Signed)
Transition of Care [facility]) - Initial/Assessment Note    Patient Details  Name: Blake Mcknight MRN: [id number] Date of Birth: Aug 01, 1955  [facility]) CM/SW Contact:    Zenon Mayo, RN Phone Number: 06/22/2019, 1:06 PM  Clinical Narrative:                 From home with sister, he is active with [facility] for [facility], will need to add HHPT/HHOT, he states he wants to continue with [facility]  Butch Penny notified to add HHPT, HHOT.  And MD notified.  TOC team will continue to follow for dc needs.   Expected Discharge Plan: [place] Barriers to Discharge: Continued Medical Work up   Patient Goals and CMS Choice Patient states their goals for this hospitalization and ongoing recovery are:: get better CMS Medicare.gov Compare Post Acute Care list provided to:: Patient Choice offered to / list presented to : Patient  Expected Discharge Plan and Services Expected Discharge Plan: [place]   Discharge Planning Services: CM Consult Post Acute Care Choice: [place], Resumption of Svcs/PTA Provider Living arrangements for the past 2 months: Single Family Home                   DME Agency: NA       HH Arranged: RN, Disease Management, PT, OT [place] Agency: [place] ([place]) Date [place]: 06/22/19 Time [place]: 1305 Representative spoke with at [place]: Butch Penny  Prior Living Arrangements/Services Living arrangements for the past 2 months: [place] Lives with:: Siblings Patient language and need for interpreter reviewed:: Yes Do you feel safe going back to the place where you live?: Yes      Need for Family Participation in Patient Care: Yes (Comment) Care giver support system in place?: Yes (comment)   Criminal Activity/Legal Involvement Pertinent to Current Situation/Hospitalization: No - Comment as needed  Activities of Daily Living Home Assistive Devices/Equipment: Oxygen ADL Screening  (condition at time of admission) Patient's cognitive ability adequate to safely complete daily activities?: Yes Is the patient deaf or have difficulty hearing?: No Does the patient have difficulty seeing, even when wearing glasses/contacts?: No Does the patient have difficulty concentrating, remembering, or making decisions?: Yes Patient able to express need for assistance with ADLs?: Yes Does the patient have difficulty dressing or bathing?: Yes Independently performs ADLs?: No Does the patient have difficulty walking or climbing stairs?: Yes Weakness of Legs: Both Weakness of Arms/Hands: None  Permission Sought/Granted                  Emotional Assessment Appearance:: Appears stated age Attitude/Demeanor/Rapport: Engaged Affect (typically observed): Appropriate Orientation: : Oriented to  Time, Oriented to Place, Oriented to Self, Oriented to Situation Alcohol / Substance Use: Not Applicable Psych Involvement: No (comment)  Admission diagnosis:  Shortness of breath [R06.02] Hypoxia [R09.02] COPD exacerbation ([place]) [J44.1] Patient Active Problem List   Diagnosis Date Noted  . COPD exacerbation ([place]) 06/05/2019  . Do not resuscitate 02/18/2019  . Bullous emphysema ([place]) 07/26/2013  . Schizophrenia ([place]) 06/28/2013  . Tobacco use disorder 06/06/2013  . Abdominal pain 06/03/2013  . HTN (hypertension) 06/03/2013  . HLD (hyperlipidemia) 06/03/2013  . Chronic obstructive pulmonary disease with acute exacerbation ([place]) 06/03/2013  . Calculus of bile duct without mention of cholecystitis or obstruction 06/03/2013   PCP:  [name], MD Pharmacy:   [facility] [place] [place] [address] Phone: [phone] Fax: [phone]     Social Determinants of Health (SDOH) Interventions    Readmission Risk Interventions Readmission Risk Prevention Plan 06/22/2019  06/07/2019  Transportation Screening Complete Complete  PCP or Specialist Appt within 3-5 Days Complete Complete  HRI or Home Care Consult Complete Complete  Social Work Consult for [place] Planning/Counseling Complete -  Palliative Care Screening Complete Not Applicable  Medication Review Press photographer) Complete Referral to Pharmacy  Some recent data might be hidden

## 2019-06-22 NOTE — Plan of Care (Signed)

## 2019-06-22 NOTE — Progress Notes (Signed)
Nutrition Follow-up  DOCUMENTATION CODES:   Not applicable  INTERVENTION:   -Continue Ensure Enlive po TID, each supplement provides 350 kcal and 20 grams of protein -Continue MVI with minerals daily  NUTRITION DIAGNOSIS:   Increased nutrient needs related to chronic illness(COPD) as evidenced by estimated needs.  Ongoing  GOAL:   Patient will meet greater than or equal to 90% of their needs  Progressing   MONITOR:   PO intake, Supplement acceptance, Labs, Weight trends, Skin, I & O's  REASON FOR ASSESSMENT:   Consult Assessment of nutrition requirement/status  ASSESSMENT:   64 y.o. yo male w/ PMH significant for COPD on 2L O2 via Blake Mcknight at home, tobacco use disorder, hyperlipidemia, hypertension and schizophrenia presenting with shortness of breath and chest pain. History obtained by patient and wife at bedside. Per wife, patient has had decline in functional status over past several months and has become mostly dependent in his ADLs. This morning, patient noted sudden onset shortness of breath with central chest pain that he described as "pushing" sensation that was nonradiating. Patient has a productive cough at baseline. Per wife, he has had increasing sputum production that was yellow/gray yesterday. Patient attempted to use his albuterol inhaler without any significant relief. He also reports epigastric pain and nausea with decreased appetite. Per wife, patient has intermittent painless hematuria. He denies any fevers/chills, recent sick contacts, dysuria, urinary frequency or urgency or diarrhea.  Reviewed I/O's: +166 ml x 24 hours and +260 ml since admission  UOP: 2 L x 24 hours  Pt sleeping soundly at time of visit and did not arouse to voice. Intake remains variable; PO: 5-75%. Noted pt consumed a few sips of Ensure.   Per MD notes, possible discharge tomorrow.   Labs reviewed.   NUTRITION - FOCUSED PHYSICAL EXAM:    Most Recent Value  Orbital Region  Mild  depletion  Upper Arm Region  No depletion  Thoracic and Lumbar Region  No depletion  Buccal Region  No depletion  Temple Region  Mild depletion  Clavicle Bone Region  No depletion  Clavicle and Acromion Bone Region  No depletion  Scapular Bone Region  No depletion  Dorsal Hand  No depletion  Patellar Region  No depletion  Anterior Thigh Region  No depletion  Posterior Calf Region  No depletion  Edema (RD Assessment)  None  Hair  Reviewed  Eyes  Reviewed  Mouth  Reviewed  Skin  Reviewed  Nails  Reviewed       Diet Order:   Diet Order            Diet Heart Room service appropriate? Yes; Fluid consistency: Thin  Diet effective now              EDUCATION NEEDS:   No education needs have been identified at this time  Skin:  Skin Assessment: Reviewed RN Assessment  Last BM:  06/21/19  Height:   Ht Readings from Last 1 Encounters:  06/20/19 5\' 5"  (1.651 m)    Weight:   Wt Readings from Last 1 Encounters:  06/22/19 65.7 kg    Ideal Body Weight:  61.8 kg  BMI:  Body mass index is 24.11 kg/m.  Estimated Nutritional Needs:   Kcal:  1900-2100  Protein:  95-110 grams  Fluid:  > 1.9 L    Loistine Chance, RD, LDN, [place] Registered Dietitian II Certified Diabetes Care and Education Specialist Please refer to [facility] for RD and/or RD on-call/weekend/after hours pager

## 2019-06-23 ENCOUNTER — Telehealth: Payer: Self-pay

## 2019-06-23 DIAGNOSIS — R0902 Hypoxemia: Secondary | ICD-10-CM

## 2019-06-23 LAB — CBC
HCT: 38.3 % — ABNORMAL LOW (ref 39.0–52.0)
Hemoglobin: 12.7 g/dL — ABNORMAL LOW (ref 13.0–17.0)
MCH: 30.5 pg (ref 26.0–34.0)
MCHC: 33.2 g/dL (ref 30.0–36.0)
MCV: 92.1 fL (ref 80.0–100.0)
Platelets: 238 10*3/uL (ref 150–400)
RBC: 4.16 MIL/uL — ABNORMAL LOW (ref 4.22–5.81)
RDW: 12 % (ref 11.5–15.5)
WBC: 10 10*3/uL (ref 4.0–10.5)
nRBC: 0 % (ref 0.0–0.2)

## 2019-06-23 LAB — BASIC METABOLIC PANEL
Anion gap: 10 (ref 5–15)
BUN: 9 mg/dL (ref 8–23)
CO2: 30 mmol/L (ref 22–32)
Calcium: 8.8 mg/dL — ABNORMAL LOW (ref 8.9–10.3)
Chloride: 97 mmol/L — ABNORMAL LOW (ref 98–111)
Creatinine, Ser: 0.71 mg/dL (ref 0.61–1.24)
GFR calc Af Amer: >60 mL/min (ref >60)
GFR calc non Af Amer: >60 mL/min (ref >60)
Glucose, Bld: 107 mg/dL — ABNORMAL HIGH (ref 70–99)
Potassium: 3.6 mmol/L (ref 3.5–5.1)
Sodium: 137 mmol/L (ref 135–145)

## 2019-06-23 MED ORDER — AZITHROMYCIN 250 MG PO TABS: 250.0000 mg | ORAL_TABLET | Freq: Every day | ORAL | 0 refills | Status: AC

## 2019-06-23 MED ORDER — MONTELUKAST SODIUM 10 MG PO TABS: 10.0000 mg | ORAL_TABLET | Freq: Every day | ORAL | 0 refills | Status: DC

## 2019-06-23 MED ORDER — FLUTICASONE-SALMETEROL 250-50 MCG/DOSE IN AEPB: 1.0000 | INHALATION_SPRAY | Freq: Two times a day (BID) | RESPIRATORY_TRACT | 0 refills | Status: DC

## 2019-06-23 MED FILL — ADVAIR 250/50 DISKUS: 250-50 | 30 days supply | Qty: 60 | Fill #0

## 2019-06-23 MED FILL — AZITHROMYCIN 250 MG TABLET: 250 | 1 days supply | Qty: 1 | Fill #0

## 2019-06-23 MED FILL — MONTELUKAST SOD 10 MG TAB: 10 | 90 days supply | Qty: 90 | Fill #0

## 2019-06-23 NOTE — Telephone Encounter (Signed)
Hospital TOC per Dr. Aslam, appt 06/30/2019. 

## 2019-06-23 NOTE — Discharge Instructions (Signed)
Blake Mcknight, [name] were admitted to the hospital with shortness of breath due to COPD exacerbation. You improved with steroids, breathing treatments and antibiotics. On discharge, please continue to take azithromycin for one more day. Please continue to take your inhalers at home as prescribed.  Please follow up in the Internal Medicine Clinic at [facility] on 06/30/2019 at 9:15AM for a hospital follow up appointment.  Please follow up with Dr. Katherine Roan on 07/05/2019 at 9:45AM for a hospital follow up and further management of your COPD. You may need repeat pulmonary function testing. As you have not been taking the montelukast, please hold off on restarting this until you see Dr. Katherine Roan.   Thank you!

## 2019-06-23 NOTE — Progress Notes (Signed)
Called pts sister, she will come to take pt home @ 2pm.

## 2019-06-23 NOTE — Progress Notes (Signed)
Pt ambulate in the hallway 20 ft, on 3L [place],pt very SOB O2Sats 92%. Dr. Marva Panda notified.

## 2019-06-23 NOTE — Progress Notes (Signed)
Physical Therapy Treatment Patient Details Name: Blake Mcknight MRN: [id number] DOB: 14-Jun-1955 Today's Date: 06/23/2019    History of Present Illness Pt is a 64 yo male admitted with COPD exacerbation. PMH includes schizophrenia, HTN, HLD, COPD.   PT Comments    Pt progressing with mobility. Limited by fatigue this session, pt only willing to mobilize in room. DOE up to 3/4 with minimal activity with SpO2 down to 90% on RA; return to >/94% on 2L O2 [place] (which pt wears at home). Pt reports hopeful for d/c home today with sister. Now agreeable to HHPT services.    Follow Up Recommendations  Home health PT;Supervision - Intermittent     Equipment Recommendations  None recommended by PT    Recommendations for Other Services       Precautions / Restrictions Precautions Precautions: Fall Restrictions Weight Bearing Restrictions: No    Mobility  Bed Mobility Overal bed mobility: Independent Bed Mobility: Supine to Sit;Sit to Supine              Transfers Overall transfer level: Needs assistance Equipment used: None Transfers: Sit to/from Stand Sit to Stand: Supervision         General transfer comment: Supervision for safety due to fall risk, moving well without assist  Ambulation/Gait Ambulation/Gait assistance: Supervision Gait Distance (Feet): 30 Feet Assistive device: None Gait Pattern/deviations: Step-through pattern;Decreased stride length   Gait velocity interpretation: >2.62 ft/sec, indicative of community ambulatory General Gait Details: Steady gait without overt instability or LOB; pt only agreeable to walk in room due to fatigue; DOE 3/4 with SpO2 down to 90% on RA, returning to 94% on 2L O2    Stairs             Wheelchair Mobility    Modified Rankin (Stroke Patients Only)       Balance Overall balance assessment: Needs assistance   Sitting balance-Leahy Scale: Normal     Standing balance support: No upper extremity  supported Standing balance-Leahy Scale: Good                              Cognition Arousal/Alertness: Awake/alert Behavior During Therapy: WFL for tasks assessed/performed Overall Cognitive Status: History of cognitive impairments - at baseline                                 General Comments: Following simple commands and answering questions appropriately, sometimes with increased time/apparent slowed processing.      Exercises      General Comments        Pertinent Vitals/Pain Pain Assessment: No/denies pain    Home Living                      Prior Function            PT Goals (current goals can now be found in the care plan section) Progress towards PT goals: Progressing toward goals    Frequency    Min 3X/week      PT Plan Current plan remains appropriate    Co-evaluation              AM-PAC PT "6 Clicks" Mobility   Outcome Measure  Help needed turning from your back to your side while in a flat bed without using bedrails?: None Help needed moving from lying on your back to sitting  on the side of a flat bed without using bedrails?: None Help needed moving to and from a bed to a chair (including a wheelchair)?: None Help needed standing up from a chair using your arms (e.g., wheelchair or bedside chair)?: None Help needed to walk in hospital room?: None Help needed climbing 3-5 steps with a railing? : A Little 6 Click Score: 23    End of Session   Activity Tolerance: Patient tolerated treatment well;Patient limited by fatigue Patient left: in bed;with call bell/phone within reach;with bed alarm set Nurse Communication: Mobility status PT Visit Diagnosis: Other abnormalities of gait and mobility (R26.89)     Time: [id number] PT Time Calculation (min) (ACUTE ONLY): 12 min  Charges:  $Therapeutic Activity: 8-22 mins                    Mabeline Caras, PT, DPT Acute Rehabilitation Services  Pager  [phone] Office [place] 06/23/2019, 10:50 AM

## 2019-06-23 NOTE — Progress Notes (Addendum)
Subjective: HD#3 Overnight, no acute events reported.  This morning, patient evaluated at bedside. Pt states that he feels well and his breathing is better. He thinks he is ready to go home. He states that he will make an apt with his PCP and Pulmonologist.   Objective:  Vital signs in last 24 hours: Vitals:   06/23/19 0451 06/23/19 0452 06/23/19 0453 06/23/19 0457  BP:      Pulse:  95    Resp: 19 19 18    Temp:      TempSrc:      SpO2:  97%    Weight:    66.6 kg  Height:       CBC Latest Ref Rng & Units 06/23/2019 06/22/2019 06/21/2019  WBC 4.0 - 10.5 K/uL 10.0 11.0(H) 9.6  Hemoglobin 13.0 - 17.0 g/dL 12.7(L) 12.8(L) 15.1  Hematocrit 39.0 - 52.0 % 38.3(L) 38.8(L) 45.4  Platelets 150 - 400 K/uL 238 251 272   BMP Latest Ref Rng & Units 06/23/2019 06/22/2019 06/21/2019  Glucose 70 - 99 mg/dL 107(H) 137(H) 131(H)  BUN 8 - 23 mg/dL 9 8 10   Creatinine 0.61 - 1.24 mg/dL 0.71 0.75 0.85  Sodium 135 - 145 mmol/L 137 136 135  Potassium 3.5 - 5.1 mmol/L 3.6 3.8 4.6  Chloride 98 - 111 mmol/L 97(L) 97(L) 97(L)  CO2 22 - 32 mmol/L 30 29 26   Calcium 8.9 - 10.3 mg/dL 8.8(L) 8.9 8.8(L)   Urinalysis    Component Value Date/Time   COLORURINE YELLOW 06/21/2019 0010   APPEARANCEUR CLEAR 06/21/2019 0010   LABSPEC 1.019 06/21/2019 0010   PHURINE 6.0 06/21/2019 0010   GLUCOSEU 50 (A) 06/21/2019 0010   HGBUR NEGATIVE 06/21/2019 0010   BILIRUBINUR NEGATIVE 06/21/2019 0010   KETONESUR 5 (A) 06/21/2019 0010   PROTEINUR NEGATIVE 06/21/2019 0010   UROBILINOGEN 0.2 01/29/2019 1634   NITRITE NEGATIVE 06/21/2019 0010   LEUKOCYTESUR NEGATIVE 06/21/2019 0010   Physical Exam  Constitutional: He appears not cachectic. No distress.  Chronically ill appearing male lying comfortably in bed, on 2L [place]. NAD  Cardiovascular: Normal rate, regular rhythm, normal heart sounds and intact distal pulses.  No murmur heard. Pulmonary/Chest: No respiratory distress. He has wheezes.  Scant end expiratory wheezing     Abdominal: Soft. Normal appearance and bowel sounds are normal. There is no abdominal tenderness.  Neurological: He is alert.  Skin: Skin is warm and dry. He is not diaphoretic.   Assessment/Plan: Mr. Blake Mcknight is a 64 year old male with PMHx of COPD on 2L O2, hypertension, schizophrenia, hyperlipidemia and tobacco use disorder presenting with acute onset dyspnea on exertion with chest pain concerning for COPD exacerbation.  Acute on chronic respiratory failure 2/2 COPD exacerbation:  Patient presented with acute onset dyspnea on exertion with chest pain and has received albuterol, solumedrol and magnesium sulfate.He has been receiving nebulized albuterol with steroids and azithromycin. This morning, scant expiratory wheezing noted. He is back to baseline oxygen requirements. Will discharge home with one more day of azithromycin - Patient for discharge home today to follow up with PCP and pulmonologist - Breo ellipta 1 puff qd - Azithromycin 250mg  qd day 4/5 - RN to ambulate  - PT/OT evaluation, recommending for [facility] PT/OT/RN  Schizophrenia: Patient with ongoing auditory hallucinations, unchanged from baseline.   - Continue mirtazapine 30mg  qHs - Continue Risperidone 4mg  bid - Continue benztropine 2mg  bid - Continue trazodone 50mg  qHS prn   FEN/GI: Diet: HH  Electrolytes: Monitor and replete prn Fluids:None  DVT Prophylaxis: Lovenox Code status: DNR/DNI  Prior to Admission Living Arrangement: Home Anticipated Discharge Location: Home w/HH PT/OT/RN  Barriers to Discharge: None  Dispo: Anticipated discharge in approximately 0-1 day(s).   Harvie Heck, MD  Internal Medicine, PGY-1 06/23/2019, 7:04 AM Pager: [phone]

## 2019-06-23 NOTE — Consult Note (Signed)
   [facility] CM Inpatient Consult   06/23/2019  Blake Mcknight Jul 29, 1955 [id number]   Patient screened for extreme high risk score of 30% for unplanned readmission and hospitalizations .  Call placed to patient's hospital phone to check if potential [place] Management services are needed. Spoke with the patient, partial HIPAA verified, as explaining to  patient the reason for the call regarding post hospital follow up.  Patient confirms his primary doctor "is Dr. Katherine Roan."  Patient then states, "I am not up to talking today."  Review of patient's medical record reveals patient is admitted for COPD exacerbation with acute on chronic respiratory failure.  Primary Care Provider is  [name], MD confirmed by patient  Pharmacy is:  Did not want to talk further  Transportation to provider: Unable to assess  Plan:  Follow up with inpatient [facility] team regarding patient on high risk list and phone call outcome.  Continue to follow progress and disposition to assess for post hospital care management needs.    Please place a [facility] Care Management consult as appropriate and for questions contact:   Natividad Brood, RN BSN [place] [facility] Liaison  [phone] business mobile phone Toll free office [phone]  Fax number: [phone] [email or web address]

## 2019-06-23 NOTE — TOC Transition Note (Signed)
Transition of Care [facility]) - CM/SW Discharge Note   Patient Details  Name: Blake Mcknight MRN: [id number] Date of Birth: 1956/03/17  Transition of Care [facility]) CM/SW Contact:  Zenon Mayo, RN Phone Number: 06/23/2019, 12:37 PM   Clinical Narrative:    Patient is for dc today, Butch Penny with [facility] notified.  He is already on home  Oxygen 3 liters at home.    Final next level of care: [place] Barriers to Discharge: No Barriers Identified   Patient Goals and CMS Choice Patient states their goals for this hospitalization and ongoing recovery are:: get better CMS Medicare.gov Compare Post Acute Care list provided to:: Patient Choice offered to / list presented to : Patient  Discharge Placement                       Discharge Plan and Services   Discharge Planning Services: CM Consult Post Acute Care Choice: Home Health, Resumption of Svcs/PTA Provider            DME Agency: NA       HH Arranged: PT, OT, RN [place] Agency: [place] ([place]) Date [place]: 06/22/19 Time [place]: [place] Representative spoke with at [place]: [place] ([place]) Interventions     Readmission Risk Interventions Readmission Risk Prevention Plan 06/22/2019 06/07/2019  Transportation Screening Complete Complete  PCP or Specialist Appt within 3-5 Days Complete Complete  HRI or [place] Complete Complete  Social Work Consult for [place] Planning/Counseling Complete -  Palliative Care Screening Complete Not Applicable  Medication Review Press photographer) Complete Referral to Pharmacy  Some recent data might be hidden

## 2019-06-24 DIAGNOSIS — G4700 Insomnia, unspecified: Secondary | ICD-10-CM | POA: Diagnosis not present

## 2019-06-24 DIAGNOSIS — J9611 Chronic respiratory failure with hypoxia: Secondary | ICD-10-CM | POA: Diagnosis not present

## 2019-06-24 DIAGNOSIS — F17210 Nicotine dependence, cigarettes, uncomplicated: Secondary | ICD-10-CM | POA: Diagnosis not present

## 2019-06-24 DIAGNOSIS — E785 Hyperlipidemia, unspecified: Secondary | ICD-10-CM | POA: Diagnosis not present

## 2019-06-24 DIAGNOSIS — J9610 Chronic respiratory failure, unspecified whether with hypoxia or hypercapnia: Secondary | ICD-10-CM | POA: Diagnosis not present

## 2019-06-24 DIAGNOSIS — F200 Paranoid schizophrenia: Secondary | ICD-10-CM | POA: Diagnosis not present

## 2019-06-24 DIAGNOSIS — J441 Chronic obstructive pulmonary disease with (acute) exacerbation: Secondary | ICD-10-CM | POA: Diagnosis not present

## 2019-06-24 DIAGNOSIS — J439 Emphysema, unspecified: Secondary | ICD-10-CM | POA: Diagnosis not present

## 2019-06-24 DIAGNOSIS — I10 Essential (primary) hypertension: Secondary | ICD-10-CM | POA: Diagnosis not present

## 2019-06-24 NOTE — Discharge Summary (Signed)
Name: Blake Mcknight MRN: [id number] DOB: 07/14/1955 64 y.o. PCP: [name], MD  Date of Admission: 06/20/2019 12:43 PM Date of Discharge: 06/23/2019 Attending Physician: Dr. Velna Ochs  Discharge Diagnosis: 1. COPD exacerbation 2. Schizophrenia   Discharge Medications: Allergies as of 06/23/2019   No Known Allergies     Medication List    STOP taking these medications   Advair HFA 115-21 MCG/ACT inhaler Generic drug: fluticasone-salmeterol Replaced by: Fluticasone-Salmeterol 250-50 MCG/DOSE Aepb   predniSONE 20 MG tablet Commonly known as: DELTASONE     TAKE these medications   albuterol 108 (90 Base) MCG/ACT inhaler Commonly known as: VENTOLIN HFA Inhale 2 puffs into the lungs every 4 (four) hours as needed for wheezing or shortness of breath.   albuterol (2.5 MG/3ML) 0.083% nebulizer solution Commonly known as: PROVENTIL Take 3 mLs (2.5 mg total) by nebulization every 3 (three) hours as needed for wheezing or shortness of breath.   atorvastatin 10 MG tablet Commonly known as: LIPITOR Take 10 mg by mouth daily at 6 PM.   B-12 PO Take 1 tablet by mouth every other day.   benztropine 2 MG tablet Commonly known as: COGENTIN Take 1 tablet (2 mg total) by mouth 2 (two) times daily.   carvedilol 3.125 MG tablet Commonly known as: COREG Take 1 tablet (3.125 mg total) by mouth 2 (two) times daily with a meal. What changed: when to take this   cetirizine 10 MG tablet Commonly known as: ZYRTEC Take 10 mg by mouth daily as needed for allergies or rhinitis.   cholecalciferol 25 MCG (1000 UNIT) tablet Commonly known as: VITAMIN D Take 4,000 Units by mouth every evening.   enalapril 10 MG tablet Commonly known as: VASOTEC Take 10 mg by mouth daily.   famotidine 40 MG tablet Commonly known as: PEPCID Take 40 mg by mouth 2 (two) times daily.   Fluticasone-Salmeterol 250-50 MCG/DOSE Aepb Commonly known as: Advair Diskus Inhale 1 puff into the  lungs 2 (two) times daily. Replaces: Advair HFA 115-21 MCG/ACT inhaler   guaiFENesin-dextromethorphan 100-10 MG/5ML syrup Commonly known as: ROBITUSSIN DM Take 10 mLs by mouth every 4 (four) hours as needed for cough.   mirtazapine 30 MG tablet Commonly known as: REMERON Take 1 tablet (30 mg total) by mouth at bedtime.   montelukast 10 MG tablet Commonly known as: SINGULAIR Take 1 tablet (10 mg total) by mouth daily.   multivitamin Tabs tablet Take 1 tablet by mouth daily.   nicotine 14 mg/24hr patch Commonly known as: NICODERM CQ - dosed in mg/24 hours Place 1 patch (14 mg total) onto the skin daily.   omeprazole 20 MG capsule Commonly known as: PRILOSEC Take 1 capsule (20 mg total) by mouth daily.   OXYGEN Inhale 2 L/min into the lungs continuous.   polyethylene glycol 17 g packet Commonly known as: MIRALAX / GLYCOLAX Take 17 g by mouth daily as needed for mild constipation.   risperidone 4 MG tablet Commonly known as: RISPERDAL Take 1 tablet (4 mg total) by mouth 2 (two) times daily.   sodium chloride 0.65 % Soln nasal spray Commonly known as: OCEAN Place 1 spray into both nostrils as needed for congestion.   SPIRULINA PO Take 1 capsule by mouth daily.   traZODone 50 MG tablet Commonly known as: DESYREL Take 1 tablet (50 mg total) by mouth at bedtime as needed for sleep.   VITAMIN C PO Take 1 tablet by mouth 3 (three) times a week.  ASK your doctor about these medications   azithromycin 250 MG tablet Commonly known as: ZITHROMAX Take 1 tablet (250 mg total) by mouth daily for 1 day. Ask about: Should I take this medication?       Disposition and follow-up:   Mr.Blake Mcknight was discharged from [facility] in Stable condition.  At the hospital follow up visit please address:  1.  COPD exacerbation: Patient discharged with one day of azithromycin to complete 5-day course. Patient instructed to resume montelukast on discharge.  He is to follow up with PCP and pulmonologist.   Schizophrenia: Stable auditory hallucinations; patient to resume home meds on discharge   2.  Labs / imaging needed at time of follow-up: CBC  3.  Pending labs/ test needing follow-up: None  Follow-up Appointments: Follow-up Information    [name], MD. Go on 07/05/2019.   Specialty: Pulmonary Disease Why: 3:30 for hospital followup Contact information: [place] [address] [place]. Go on 06/30/2019.   Why: at 9:15AM for hospital follow up appointment  Contact information: [address] [place] Follow up.   Why: HHRN,HHPT, [place] Hospital Course by problem list: 1. Acute on chronic respiratory failure  2/2 COPD exacerbation Patient with history of GOLD Stage 3 COPD (FEV1 35%) on chronic 2L O2 presenting with acute onset dyspnea on exertion with chest pain. He received albuterol, solumedrol and magnesium sulfate. He was started on azithromycin and ceftriaxone initially; however, no concerns of pneumonia so ceftriaxone was discontinued. Patient continued with nebulized albuterol, steroids and azithromycin with significant improvement in symptoms. Patient discharged to with antibiotics and instructed to resume montelukast. He is to follow up with PCP and pulmonologist on discharge, may benefit from immunomodulator.   2. Schizophrenia Patient with history of schizophrenia on risperidone, mirtazapine, and benztropine. He does have ongoing auditory hallucinations. He is alert and oriented to self, place and situation but not time. Patient responding to internal stimuli throughout duration of admission but appears to be stable and at baseline. Patient continued on home antipsychotic medications on discharge.   Discharge Vitals:   BP (!) 154/79 (BP Location: Right Arm)   Pulse (!)  105   Temp 97.9 F (36.6 C) (Oral)   Resp 18   Ht 5\' 5"  (1.651 m)   Wt 66.6 kg   SpO2 97%   BMI 24.45 kg/m   Pertinent Labs, Studies, and Procedures:  CBC Latest Ref Rng & Units 06/23/2019 06/22/2019 06/21/2019  WBC 4.0 - 10.5 K/uL 10.0 11.0(H) 9.6  Hemoglobin 13.0 - 17.0 g/dL 12.7(L) 12.8(L) 15.1  Hematocrit 39.0 - 52.0 % 38.3(L) 38.8(L) 45.4  Platelets 150 - 400 K/uL 238 251 272   BMP Latest Ref Rng & Units 06/23/2019 06/22/2019 06/21/2019  Glucose 70 - 99 mg/dL 107(H) 137(H) 131(H)  BUN 8 - 23 mg/dL 9 8 10   Creatinine 0.61 - 1.24 mg/dL 0.71 0.75 0.85  Sodium 135 - 145 mmol/L 137 136 135  Potassium 3.5 - 5.1 mmol/L 3.6 3.8 4.6  Chloride 98 - 111 mmol/L 97(L) 97(L) 97(L)  CO2 22 - 32 mmol/L 30 29 26   Calcium 8.9 - 10.3 mg/dL 8.8(L) 8.9 8.8(L)   Urinalysis    Component Value Date/Time   COLORURINE YELLOW 06/21/2019 0010   APPEARANCEUR CLEAR 06/21/2019 0010  LABSPEC 1.019 06/21/2019 0010   PHURINE 6.0 06/21/2019 0010   GLUCOSEU 50 (A) 06/21/2019 0010   HGBUR NEGATIVE 06/21/2019 0010   BILIRUBINUR NEGATIVE 06/21/2019 0010   KETONESUR 5 (A) 06/21/2019 0010   PROTEINUR NEGATIVE 06/21/2019 0010   UROBILINOGEN 0.2 01/29/2019 1634   NITRITE NEGATIVE 06/21/2019 0010   LEUKOCYTESUR NEGATIVE 06/21/2019 0010   CXR 06/20/2019: IMPRESSION: No active disease.  Extensive findings of COPD.  CT ANGIO CHEST 06/20/2019: IMPRESSION: No definite evidence of pulmonary embolus. No acute abnormality seen in the chest.  CT HEAD WO CONTRAST 06/20/2019: IMPRESSION: No acute intracranial pathology.  Discharge Instructions: Discharge Instructions    Call MD for:  difficulty breathing, headache or visual disturbances   Complete by: As directed    Call MD for:  extreme fatigue   Complete by: As directed    Call MD for:  hives   Complete by: As directed    Call MD for:  persistant dizziness or light-headedness   Complete by: As directed    Call MD for:  persistant nausea and vomiting   Complete by:  As directed    Call MD for:  severe uncontrolled pain   Complete by: As directed    Call MD for:  temperature >100.4   Complete by: As directed    Diet - low sodium heart healthy   Complete by: As directed    Discharge instructions   Complete by: As directed    Mr. Blake Mcknight,  Your ADVAIR dose has been increased on discharge. Please continue to take this 1 puff twice daily and you are to resume your montelukast 10mg  daily.  Please follow up in the Internal Medicine Clinic on 4/15 at 9:15AM Please follow up with Dr. Katherine Roan on 4/20 at 3:30pm   Thank you!   Increase activity slowly   Complete by: As directed       Signed: Harvie Heck, MD 06/29/2019, 8:04 AM   Pager: [phone]

## 2019-06-27 DIAGNOSIS — G4700 Insomnia, unspecified: Secondary | ICD-10-CM | POA: Diagnosis not present

## 2019-06-27 DIAGNOSIS — F200 Paranoid schizophrenia: Secondary | ICD-10-CM | POA: Diagnosis not present

## 2019-06-27 DIAGNOSIS — J9610 Chronic respiratory failure, unspecified whether with hypoxia or hypercapnia: Secondary | ICD-10-CM | POA: Diagnosis not present

## 2019-06-27 DIAGNOSIS — J441 Chronic obstructive pulmonary disease with (acute) exacerbation: Secondary | ICD-10-CM | POA: Diagnosis not present

## 2019-06-27 DIAGNOSIS — J439 Emphysema, unspecified: Secondary | ICD-10-CM | POA: Diagnosis not present

## 2019-06-27 DIAGNOSIS — J9611 Chronic respiratory failure with hypoxia: Secondary | ICD-10-CM | POA: Diagnosis not present

## 2019-06-27 DIAGNOSIS — I10 Essential (primary) hypertension: Secondary | ICD-10-CM | POA: Diagnosis not present

## 2019-06-27 DIAGNOSIS — E785 Hyperlipidemia, unspecified: Secondary | ICD-10-CM | POA: Diagnosis not present

## 2019-06-27 DIAGNOSIS — F17210 Nicotine dependence, cigarettes, uncomplicated: Secondary | ICD-10-CM | POA: Diagnosis not present

## 2019-06-27 NOTE — Telephone Encounter (Signed)
TOC call attempted, no answer. SChaplin, RN,BSN

## 2019-06-28 DIAGNOSIS — F17210 Nicotine dependence, cigarettes, uncomplicated: Secondary | ICD-10-CM | POA: Diagnosis not present

## 2019-06-28 DIAGNOSIS — J9610 Chronic respiratory failure, unspecified whether with hypoxia or hypercapnia: Secondary | ICD-10-CM | POA: Diagnosis not present

## 2019-06-28 DIAGNOSIS — E785 Hyperlipidemia, unspecified: Secondary | ICD-10-CM | POA: Diagnosis not present

## 2019-06-28 DIAGNOSIS — I10 Essential (primary) hypertension: Secondary | ICD-10-CM | POA: Diagnosis not present

## 2019-06-28 DIAGNOSIS — G4700 Insomnia, unspecified: Secondary | ICD-10-CM | POA: Diagnosis not present

## 2019-06-28 DIAGNOSIS — J9611 Chronic respiratory failure with hypoxia: Secondary | ICD-10-CM | POA: Diagnosis not present

## 2019-06-28 DIAGNOSIS — J441 Chronic obstructive pulmonary disease with (acute) exacerbation: Secondary | ICD-10-CM | POA: Diagnosis not present

## 2019-06-28 DIAGNOSIS — F200 Paranoid schizophrenia: Secondary | ICD-10-CM | POA: Diagnosis not present

## 2019-06-28 DIAGNOSIS — J439 Emphysema, unspecified: Secondary | ICD-10-CM | POA: Diagnosis not present

## 2019-06-29 ENCOUNTER — Other Ambulatory Visit: Payer: Self-pay | Admitting: *Deleted

## 2019-06-29 DIAGNOSIS — I10 Essential (primary) hypertension: Secondary | ICD-10-CM | POA: Diagnosis not present

## 2019-06-29 DIAGNOSIS — E785 Hyperlipidemia, unspecified: Secondary | ICD-10-CM | POA: Diagnosis not present

## 2019-06-29 DIAGNOSIS — F17210 Nicotine dependence, cigarettes, uncomplicated: Secondary | ICD-10-CM | POA: Diagnosis not present

## 2019-06-29 DIAGNOSIS — J9610 Chronic respiratory failure, unspecified whether with hypoxia or hypercapnia: Secondary | ICD-10-CM | POA: Diagnosis not present

## 2019-06-29 DIAGNOSIS — G4700 Insomnia, unspecified: Secondary | ICD-10-CM | POA: Diagnosis not present

## 2019-06-29 DIAGNOSIS — J439 Emphysema, unspecified: Secondary | ICD-10-CM | POA: Diagnosis not present

## 2019-06-29 DIAGNOSIS — F200 Paranoid schizophrenia: Secondary | ICD-10-CM | POA: Diagnosis not present

## 2019-06-29 DIAGNOSIS — J9611 Chronic respiratory failure with hypoxia: Secondary | ICD-10-CM | POA: Diagnosis not present

## 2019-06-29 DIAGNOSIS — J441 Chronic obstructive pulmonary disease with (acute) exacerbation: Secondary | ICD-10-CM | POA: Diagnosis not present

## 2019-06-29 NOTE — Patient Outreach (Signed)
[facility]) Care Management  06/29/2019  Blake Mcknight 1956-01-24 [id number]   RED ON EMMI ALERT - General Discharge Day # 4 Date: 4/13 Red Alert Reason: no follow up   Outreach attempt #1, unsuccessful, HIPAA compliant voice message left.   Plan: RN CM will send unsuccessful outreach letter and follow up within the next 3-4 business days.  [name], [place], MSN [place] [phone]

## 2019-06-30 ENCOUNTER — Ambulatory Visit: Payer: Medicare Other

## 2019-06-30 ENCOUNTER — Telehealth: Payer: Self-pay | Admitting: *Deleted

## 2019-06-30 NOTE — Telephone Encounter (Signed)
Call to patient's home number spoke with his sister Yuvraj Leiman who stated that she is the patient's Guardian and that patient was asleep.  When told about missed appointment in the Clinics said that she was not aware of the appointment.  Plans to follow up wit patient's Primary Physician and does not want to bring patient in for the follow up appointment.  Informed her that I would let the schedulers know that he will not the appointment.  Sander Nephew, RN 06/30/2019 10:06 AM.

## 2019-07-01 DIAGNOSIS — J9611 Chronic respiratory failure with hypoxia: Secondary | ICD-10-CM | POA: Diagnosis not present

## 2019-07-01 DIAGNOSIS — J9610 Chronic respiratory failure, unspecified whether with hypoxia or hypercapnia: Secondary | ICD-10-CM | POA: Diagnosis not present

## 2019-07-01 DIAGNOSIS — J441 Chronic obstructive pulmonary disease with (acute) exacerbation: Secondary | ICD-10-CM | POA: Diagnosis not present

## 2019-07-01 DIAGNOSIS — J439 Emphysema, unspecified: Secondary | ICD-10-CM | POA: Diagnosis not present

## 2019-07-01 DIAGNOSIS — I10 Essential (primary) hypertension: Secondary | ICD-10-CM | POA: Diagnosis not present

## 2019-07-01 DIAGNOSIS — F200 Paranoid schizophrenia: Secondary | ICD-10-CM | POA: Diagnosis not present

## 2019-07-01 DIAGNOSIS — E785 Hyperlipidemia, unspecified: Secondary | ICD-10-CM | POA: Diagnosis not present

## 2019-07-01 DIAGNOSIS — F17210 Nicotine dependence, cigarettes, uncomplicated: Secondary | ICD-10-CM | POA: Diagnosis not present

## 2019-07-01 DIAGNOSIS — G4700 Insomnia, unspecified: Secondary | ICD-10-CM | POA: Diagnosis not present

## 2019-07-04 ENCOUNTER — Other Ambulatory Visit: Payer: Self-pay | Admitting: *Deleted

## 2019-07-04 DIAGNOSIS — E785 Hyperlipidemia, unspecified: Secondary | ICD-10-CM | POA: Diagnosis not present

## 2019-07-04 DIAGNOSIS — I10 Essential (primary) hypertension: Secondary | ICD-10-CM | POA: Diagnosis not present

## 2019-07-04 DIAGNOSIS — F17210 Nicotine dependence, cigarettes, uncomplicated: Secondary | ICD-10-CM | POA: Diagnosis not present

## 2019-07-04 DIAGNOSIS — G4700 Insomnia, unspecified: Secondary | ICD-10-CM | POA: Diagnosis not present

## 2019-07-04 DIAGNOSIS — J9611 Chronic respiratory failure with hypoxia: Secondary | ICD-10-CM | POA: Diagnosis not present

## 2019-07-04 DIAGNOSIS — J439 Emphysema, unspecified: Secondary | ICD-10-CM | POA: Diagnosis not present

## 2019-07-04 DIAGNOSIS — J9610 Chronic respiratory failure, unspecified whether with hypoxia or hypercapnia: Secondary | ICD-10-CM | POA: Diagnosis not present

## 2019-07-04 DIAGNOSIS — F200 Paranoid schizophrenia: Secondary | ICD-10-CM | POA: Diagnosis not present

## 2019-07-04 DIAGNOSIS — J441 Chronic obstructive pulmonary disease with (acute) exacerbation: Secondary | ICD-10-CM | POA: Diagnosis not present

## 2019-07-04 NOTE — Patient Outreach (Signed)
[facility]) Care Management  07/04/2019  JAMAN ZERBE [birth year] [id number]   RED ON EMMI ALERT - General Discharge Day # 4 Date: 4/13 Red Alert Reason: no follow up   Outreach attempt #2, unsuccessful, HIPAA compliant voice message left.  Call then placed to sister [name], no answer, HIPAA compliant voice message left.    Plan: RN CM will follow up within the next 3-4 business days.  [name], [place], MSN [place] [phone]

## 2019-07-05 DIAGNOSIS — M2550 Pain in unspecified joint: Secondary | ICD-10-CM | POA: Diagnosis not present

## 2019-07-05 DIAGNOSIS — I119 Hypertensive heart disease without heart failure: Secondary | ICD-10-CM | POA: Diagnosis not present

## 2019-07-05 DIAGNOSIS — Z79899 Other long term (current) drug therapy: Secondary | ICD-10-CM | POA: Diagnosis not present

## 2019-07-05 DIAGNOSIS — M159 Polyosteoarthritis, unspecified: Secondary | ICD-10-CM | POA: Diagnosis not present

## 2019-07-05 DIAGNOSIS — K267 Chronic duodenal ulcer without hemorrhage or perforation: Secondary | ICD-10-CM | POA: Diagnosis not present

## 2019-07-05 DIAGNOSIS — J441 Chronic obstructive pulmonary disease with (acute) exacerbation: Secondary | ICD-10-CM | POA: Diagnosis not present

## 2019-07-05 DIAGNOSIS — N401 Enlarged prostate with lower urinary tract symptoms: Secondary | ICD-10-CM | POA: Diagnosis not present

## 2019-07-05 DIAGNOSIS — M899 Disorder of bone, unspecified: Secondary | ICD-10-CM | POA: Diagnosis not present

## 2019-07-05 DIAGNOSIS — F319 Bipolar disorder, unspecified: Secondary | ICD-10-CM | POA: Diagnosis not present

## 2019-07-05 DIAGNOSIS — E559 Vitamin D deficiency, unspecified: Secondary | ICD-10-CM | POA: Diagnosis not present

## 2019-07-05 DIAGNOSIS — K2100 Gastro-esophageal reflux disease with esophagitis, without bleeding: Secondary | ICD-10-CM | POA: Diagnosis not present

## 2019-07-05 DIAGNOSIS — E7800 Pure hypercholesterolemia, unspecified: Secondary | ICD-10-CM | POA: Diagnosis not present

## 2019-07-06 DIAGNOSIS — I10 Essential (primary) hypertension: Secondary | ICD-10-CM | POA: Diagnosis not present

## 2019-07-06 DIAGNOSIS — F17210 Nicotine dependence, cigarettes, uncomplicated: Secondary | ICD-10-CM | POA: Diagnosis not present

## 2019-07-06 DIAGNOSIS — G4700 Insomnia, unspecified: Secondary | ICD-10-CM | POA: Diagnosis not present

## 2019-07-06 DIAGNOSIS — F200 Paranoid schizophrenia: Secondary | ICD-10-CM | POA: Diagnosis not present

## 2019-07-06 DIAGNOSIS — J9610 Chronic respiratory failure, unspecified whether with hypoxia or hypercapnia: Secondary | ICD-10-CM | POA: Diagnosis not present

## 2019-07-06 DIAGNOSIS — J9611 Chronic respiratory failure with hypoxia: Secondary | ICD-10-CM | POA: Diagnosis not present

## 2019-07-06 DIAGNOSIS — J441 Chronic obstructive pulmonary disease with (acute) exacerbation: Secondary | ICD-10-CM | POA: Diagnosis not present

## 2019-07-06 DIAGNOSIS — E785 Hyperlipidemia, unspecified: Secondary | ICD-10-CM | POA: Diagnosis not present

## 2019-07-06 DIAGNOSIS — J439 Emphysema, unspecified: Secondary | ICD-10-CM | POA: Diagnosis not present

## 2019-07-08 ENCOUNTER — Other Ambulatory Visit: Payer: Self-pay | Admitting: *Deleted

## 2019-07-08 ENCOUNTER — Encounter: Payer: Self-pay | Admitting: *Deleted

## 2019-07-08 DIAGNOSIS — J441 Chronic obstructive pulmonary disease with (acute) exacerbation: Secondary | ICD-10-CM | POA: Diagnosis not present

## 2019-07-08 DIAGNOSIS — J439 Emphysema, unspecified: Secondary | ICD-10-CM | POA: Diagnosis not present

## 2019-07-08 DIAGNOSIS — I10 Essential (primary) hypertension: Secondary | ICD-10-CM | POA: Diagnosis not present

## 2019-07-08 DIAGNOSIS — F17210 Nicotine dependence, cigarettes, uncomplicated: Secondary | ICD-10-CM | POA: Diagnosis not present

## 2019-07-08 DIAGNOSIS — J9611 Chronic respiratory failure with hypoxia: Secondary | ICD-10-CM | POA: Diagnosis not present

## 2019-07-08 DIAGNOSIS — E785 Hyperlipidemia, unspecified: Secondary | ICD-10-CM | POA: Diagnosis not present

## 2019-07-08 DIAGNOSIS — F200 Paranoid schizophrenia: Secondary | ICD-10-CM | POA: Diagnosis not present

## 2019-07-08 DIAGNOSIS — G4700 Insomnia, unspecified: Secondary | ICD-10-CM | POA: Diagnosis not present

## 2019-07-08 DIAGNOSIS — J9610 Chronic respiratory failure, unspecified whether with hypoxia or hypercapnia: Secondary | ICD-10-CM | POA: Diagnosis not present

## 2019-07-08 NOTE — Patient Outreach (Signed)
[facility]) Care Management  07/08/2019  TAGGART WASHABAUGH Jun 25, 1955 [id number]   RED ON EMMI ALERT- General Discharge Day #4 Date:4/13 Red Alert Reason:no follow up  Outreach attempt #3, initially unsuccessful, HIPAA compliant voice message left.  Call then received back from sister, Raymon Mutton identity verified.  This care manager introduced self and stated purpose of call.  [facility] care management services explained.  Social: Member lives with sister at this time, sister report family all take turns providing care for him. State she has provided him with assistance completing ADL's, provides all meals for him.  This care manager inquired about increasing support in the home, possible aide to come in to help with bathing, etc.  She declines offer at this but state she will inform this care manager if she feel this would be beneficial.  He is currently having visits from home health, PT, and OT.  Conditions: Per chart, has history of COPD, HTN, HLD, and schizophrenia.  Sister report member still has cough, not productive at this time.  He is on home O2 as well.  Medications: Reviewed with sister, confirms he is taking all medications as instructed.  Denies concern for financial assistance.    Appointments: Was seen by PCP on 4/20, sister accompanied him to visit, denies any urgent concerns.  This care manager inquired about pulmonology follow up, she state member's PCP is a pulmonary specialist.  Advance Directives: Sister report member does not have legal POA but MOST form was completed with PCP this week.   Plan: RN CM will follow up with sister within the next 2 weeks.  Fall Risk  07/08/2019  Falls in the past year? 0  Number falls in past yr: 0  Injury with Fall? 0     [facility] CM Care Plan Problem One     Most Recent Value  Care Plan Problem One  Risk for readmission related to COPD complications as evidenced by recent hospitalizations  Role Documenting  the Problem One  Care Management [place] for Problem One  Active  [facility] Long Term Goal   Member will not be readmitted to hospital within the next 31 days  [facility] Long Term Goal Start Date  07/08/19  Interventions for Problem One Long Term Goal  Reviewed recent AVS with family, medications reviewed, confirmed member has had follow up appointment  [facility] CM Short Term Goal #1   Family will report decrease in cough over the next 2 weeks  [facility] CM Short Term Goal #1 Start Date  07/08/19  Interventions for Short Term Goal #1  Discussed use of oxygen, inhaler, and nebulizer when needed to decrease cough/shortness of breath  [facility] CM Short Term Goal #2   Family will report increase in strength, able to provide own bath within the next 2 weeks  [facility] CM Short Term Goal #2 Start Date  07/08/19  Interventions for Short Term Goal #2  Family educated on importance of participating with PT/OT to increase recovery and strength     [name], Therapist, sports, MSN [place] Manager [phone]

## 2019-07-09 DIAGNOSIS — G4700 Insomnia, unspecified: Secondary | ICD-10-CM | POA: Diagnosis not present

## 2019-07-09 DIAGNOSIS — J441 Chronic obstructive pulmonary disease with (acute) exacerbation: Secondary | ICD-10-CM | POA: Diagnosis not present

## 2019-07-09 DIAGNOSIS — J439 Emphysema, unspecified: Secondary | ICD-10-CM | POA: Diagnosis not present

## 2019-07-09 DIAGNOSIS — J9610 Chronic respiratory failure, unspecified whether with hypoxia or hypercapnia: Secondary | ICD-10-CM | POA: Diagnosis not present

## 2019-07-09 DIAGNOSIS — F200 Paranoid schizophrenia: Secondary | ICD-10-CM | POA: Diagnosis not present

## 2019-07-09 DIAGNOSIS — F17210 Nicotine dependence, cigarettes, uncomplicated: Secondary | ICD-10-CM | POA: Diagnosis not present

## 2019-07-09 DIAGNOSIS — I10 Essential (primary) hypertension: Secondary | ICD-10-CM | POA: Diagnosis not present

## 2019-07-09 DIAGNOSIS — J9611 Chronic respiratory failure with hypoxia: Secondary | ICD-10-CM | POA: Diagnosis not present

## 2019-07-09 DIAGNOSIS — E785 Hyperlipidemia, unspecified: Secondary | ICD-10-CM | POA: Diagnosis not present

## 2019-07-11 DIAGNOSIS — Z9181 History of falling: Secondary | ICD-10-CM | POA: Diagnosis not present

## 2019-07-11 DIAGNOSIS — I10 Essential (primary) hypertension: Secondary | ICD-10-CM | POA: Diagnosis not present

## 2019-07-11 DIAGNOSIS — G4700 Insomnia, unspecified: Secondary | ICD-10-CM | POA: Diagnosis not present

## 2019-07-11 DIAGNOSIS — F200 Paranoid schizophrenia: Secondary | ICD-10-CM | POA: Diagnosis not present

## 2019-07-11 DIAGNOSIS — J439 Emphysema, unspecified: Secondary | ICD-10-CM | POA: Diagnosis not present

## 2019-07-11 DIAGNOSIS — J9611 Chronic respiratory failure with hypoxia: Secondary | ICD-10-CM | POA: Diagnosis not present

## 2019-07-11 DIAGNOSIS — E785 Hyperlipidemia, unspecified: Secondary | ICD-10-CM | POA: Diagnosis not present

## 2019-07-11 DIAGNOSIS — Z9981 Dependence on supplemental oxygen: Secondary | ICD-10-CM | POA: Diagnosis not present

## 2019-07-11 DIAGNOSIS — F17210 Nicotine dependence, cigarettes, uncomplicated: Secondary | ICD-10-CM | POA: Diagnosis not present

## 2019-07-13 DIAGNOSIS — E785 Hyperlipidemia, unspecified: Secondary | ICD-10-CM | POA: Diagnosis not present

## 2019-07-13 DIAGNOSIS — J439 Emphysema, unspecified: Secondary | ICD-10-CM | POA: Diagnosis not present

## 2019-07-13 DIAGNOSIS — J9611 Chronic respiratory failure with hypoxia: Secondary | ICD-10-CM | POA: Diagnosis not present

## 2019-07-13 DIAGNOSIS — F17210 Nicotine dependence, cigarettes, uncomplicated: Secondary | ICD-10-CM | POA: Diagnosis not present

## 2019-07-13 DIAGNOSIS — F200 Paranoid schizophrenia: Secondary | ICD-10-CM | POA: Diagnosis not present

## 2019-07-13 DIAGNOSIS — I10 Essential (primary) hypertension: Secondary | ICD-10-CM | POA: Diagnosis not present

## 2019-07-14 DIAGNOSIS — J439 Emphysema, unspecified: Secondary | ICD-10-CM | POA: Diagnosis not present

## 2019-07-14 DIAGNOSIS — F17210 Nicotine dependence, cigarettes, uncomplicated: Secondary | ICD-10-CM | POA: Diagnosis not present

## 2019-07-14 DIAGNOSIS — J9611 Chronic respiratory failure with hypoxia: Secondary | ICD-10-CM | POA: Diagnosis not present

## 2019-07-14 DIAGNOSIS — E785 Hyperlipidemia, unspecified: Secondary | ICD-10-CM | POA: Diagnosis not present

## 2019-07-14 DIAGNOSIS — F200 Paranoid schizophrenia: Secondary | ICD-10-CM | POA: Diagnosis not present

## 2019-07-14 DIAGNOSIS — I10 Essential (primary) hypertension: Secondary | ICD-10-CM | POA: Diagnosis not present

## 2019-07-18 DIAGNOSIS — F200 Paranoid schizophrenia: Secondary | ICD-10-CM | POA: Diagnosis not present

## 2019-07-18 DIAGNOSIS — J439 Emphysema, unspecified: Secondary | ICD-10-CM | POA: Diagnosis not present

## 2019-07-18 DIAGNOSIS — J9611 Chronic respiratory failure with hypoxia: Secondary | ICD-10-CM | POA: Diagnosis not present

## 2019-07-18 DIAGNOSIS — E785 Hyperlipidemia, unspecified: Secondary | ICD-10-CM | POA: Diagnosis not present

## 2019-07-18 DIAGNOSIS — F17210 Nicotine dependence, cigarettes, uncomplicated: Secondary | ICD-10-CM | POA: Diagnosis not present

## 2019-07-18 DIAGNOSIS — I10 Essential (primary) hypertension: Secondary | ICD-10-CM | POA: Diagnosis not present

## 2019-07-19 DIAGNOSIS — I10 Essential (primary) hypertension: Secondary | ICD-10-CM | POA: Diagnosis not present

## 2019-07-19 DIAGNOSIS — F17210 Nicotine dependence, cigarettes, uncomplicated: Secondary | ICD-10-CM | POA: Diagnosis not present

## 2019-07-19 DIAGNOSIS — J9611 Chronic respiratory failure with hypoxia: Secondary | ICD-10-CM | POA: Diagnosis not present

## 2019-07-19 DIAGNOSIS — E785 Hyperlipidemia, unspecified: Secondary | ICD-10-CM | POA: Diagnosis not present

## 2019-07-19 DIAGNOSIS — F200 Paranoid schizophrenia: Secondary | ICD-10-CM | POA: Diagnosis not present

## 2019-07-19 DIAGNOSIS — J439 Emphysema, unspecified: Secondary | ICD-10-CM | POA: Diagnosis not present

## 2019-07-22 ENCOUNTER — Other Ambulatory Visit: Payer: Self-pay | Admitting: *Deleted

## 2019-07-22 NOTE — Patient Outreach (Signed)
[facility]ake [facility]ck[facility]) Care Management  07/22/2019  STELLAN VICK 15-Oct-1955 [id number]   Call placed to member's sister/caregiver to complete initial assessment and to follow up on COPD management.  She report he is improving, walking better since receiving therapy at home.  State he remains short of breath at times, but no more that his usual.  He is wearing her home O2 as instructed, has follow up appointment with PCP on June 1.  Report his appetite is good for "what he want to eat."  State he loves Popeye's chicken, discussed proper diet.  She verbalizes understanding but also state "If he want it and I know he'll eat it, I'm going to get it."  She report he does drink Ensure for additional nutritional support.  Denies any urgent concerns at this time, will plan for follow up within the next month.  [facility] CM Care Plan Problem One     Most Recent Value  Care Plan Problem One  Risk for readmission related to COPD complications as evidenced by recent hospitalizations  Role Documenting the Problem One  Care Management [place] for Problem One  Active  [facility] Long Term Goal   Member will not be readmitted to hospital within the next 31 days  [facility] Long Term Goal Start Date  07/08/19  Interventions for Problem One Long Term Goal  Plan of care reviewed with sister, including follow up with PCP in effort to manage chronic conditions  [facility] CM Short Term Goal #1   Family will report decrease in cough over the next 2 weeks  [facility] CM Short Term Goal #1 Start Date  07/08/19  [facility] CM Short Term Goal #1 Met Date  07/22/19  TH[facility] CM Short Term Goal #2   Family will report increase in strength, able to provide own bath within the next 2 weeks  [facility]N CM Short Term Goal #2 Start Date  07/08/19  Interventions for Short Term Goal #2  Discussed ongoing participation with [facility] in effort to increase strength     [name], Therapist, sports, MSN [place] Manager [phone]

## 2019-07-26 DIAGNOSIS — J439 Emphysema, unspecified: Secondary | ICD-10-CM | POA: Diagnosis not present

## 2019-07-26 DIAGNOSIS — E785 Hyperlipidemia, unspecified: Secondary | ICD-10-CM | POA: Diagnosis not present

## 2019-07-26 DIAGNOSIS — F200 Paranoid schizophrenia: Secondary | ICD-10-CM | POA: Diagnosis not present

## 2019-07-26 DIAGNOSIS — J9611 Chronic respiratory failure with hypoxia: Secondary | ICD-10-CM | POA: Diagnosis not present

## 2019-07-26 DIAGNOSIS — I10 Essential (primary) hypertension: Secondary | ICD-10-CM | POA: Diagnosis not present

## 2019-07-26 DIAGNOSIS — F17210 Nicotine dependence, cigarettes, uncomplicated: Secondary | ICD-10-CM | POA: Diagnosis not present

## 2019-07-27 DIAGNOSIS — F17210 Nicotine dependence, cigarettes, uncomplicated: Secondary | ICD-10-CM | POA: Diagnosis not present

## 2019-07-27 DIAGNOSIS — E785 Hyperlipidemia, unspecified: Secondary | ICD-10-CM | POA: Diagnosis not present

## 2019-07-27 DIAGNOSIS — F200 Paranoid schizophrenia: Secondary | ICD-10-CM | POA: Diagnosis not present

## 2019-07-27 DIAGNOSIS — I10 Essential (primary) hypertension: Secondary | ICD-10-CM | POA: Diagnosis not present

## 2019-07-27 DIAGNOSIS — J439 Emphysema, unspecified: Secondary | ICD-10-CM | POA: Diagnosis not present

## 2019-07-27 DIAGNOSIS — J9611 Chronic respiratory failure with hypoxia: Secondary | ICD-10-CM | POA: Diagnosis not present

## 2019-07-28 ENCOUNTER — Other Ambulatory Visit (HOSPITAL_COMMUNITY): Payer: Self-pay | Admitting: *Deleted

## 2019-07-28 DIAGNOSIS — F200 Paranoid schizophrenia: Secondary | ICD-10-CM

## 2019-07-28 DIAGNOSIS — F99 Mental disorder, not otherwise specified: Secondary | ICD-10-CM

## 2019-07-28 DIAGNOSIS — F5105 Insomnia due to other mental disorder: Secondary | ICD-10-CM

## 2019-07-28 MED ORDER — MIRTAZAPINE 30 MG PO TABS: 30.0000 mg | ORAL_TABLET | Freq: Every day | ORAL | 0 refills | Status: DC

## 2019-07-28 MED ORDER — RISPERIDONE 4 MG PO TABS: 4.0000 mg | ORAL_TABLET | Freq: Two times a day (BID) | ORAL | 0 refills | Status: DC

## 2019-07-29 DIAGNOSIS — J9611 Chronic respiratory failure with hypoxia: Secondary | ICD-10-CM | POA: Diagnosis not present

## 2019-07-29 DIAGNOSIS — E785 Hyperlipidemia, unspecified: Secondary | ICD-10-CM | POA: Diagnosis not present

## 2019-07-29 DIAGNOSIS — J439 Emphysema, unspecified: Secondary | ICD-10-CM | POA: Diagnosis not present

## 2019-07-29 DIAGNOSIS — F200 Paranoid schizophrenia: Secondary | ICD-10-CM | POA: Diagnosis not present

## 2019-07-29 DIAGNOSIS — I10 Essential (primary) hypertension: Secondary | ICD-10-CM | POA: Diagnosis not present

## 2019-07-29 DIAGNOSIS — F17210 Nicotine dependence, cigarettes, uncomplicated: Secondary | ICD-10-CM | POA: Diagnosis not present

## 2019-08-02 ENCOUNTER — Other Ambulatory Visit: Payer: Self-pay

## 2019-08-02 ENCOUNTER — Telehealth (INDEPENDENT_AMBULATORY_CARE_PROVIDER_SITE_OTHER): Payer: Medicare Other | Admitting: Psychiatry

## 2019-08-02 DIAGNOSIS — I10 Essential (primary) hypertension: Secondary | ICD-10-CM | POA: Diagnosis not present

## 2019-08-02 DIAGNOSIS — F99 Mental disorder, not otherwise specified: Secondary | ICD-10-CM

## 2019-08-02 DIAGNOSIS — E785 Hyperlipidemia, unspecified: Secondary | ICD-10-CM | POA: Diagnosis not present

## 2019-08-02 DIAGNOSIS — F5105 Insomnia due to other mental disorder: Secondary | ICD-10-CM

## 2019-08-02 DIAGNOSIS — F17210 Nicotine dependence, cigarettes, uncomplicated: Secondary | ICD-10-CM | POA: Diagnosis not present

## 2019-08-02 DIAGNOSIS — F200 Paranoid schizophrenia: Secondary | ICD-10-CM | POA: Diagnosis not present

## 2019-08-02 DIAGNOSIS — J9611 Chronic respiratory failure with hypoxia: Secondary | ICD-10-CM | POA: Diagnosis not present

## 2019-08-02 DIAGNOSIS — J439 Emphysema, unspecified: Secondary | ICD-10-CM | POA: Diagnosis not present

## 2019-08-02 MED ORDER — MIRTAZAPINE 30 MG PO TABS: 30.0000 mg | ORAL_TABLET | Freq: Every day | ORAL | 0 refills | Status: DC

## 2019-08-02 MED ORDER — RISPERIDONE 4 MG PO TABS: 4.0000 mg | ORAL_TABLET | Freq: Two times a day (BID) | ORAL | 0 refills | Status: DC

## 2019-08-02 MED ORDER — BENZTROPINE MESYLATE 2 MG PO TABS: 2.0000 mg | ORAL_TABLET | Freq: Two times a day (BID) | ORAL | 0 refills | Status: DC

## 2019-08-02 NOTE — Progress Notes (Signed)
Virtual Visit via Telephone Note  I connected with [name] on 08/02/19 at  2:20 PM EDT by telephone and verified that I am speaking with the correct person using two identifiers.   I discussed the limitations, risks, security and privacy concerns of performing an evaluation and management service by telephone and the availability of in person appointments. I also discussed with the patient that there may be a patient responsible charge related to this service. The patient expressed understanding and agreed to proceed.   History of Present Illness: Patient is evaluated by phone session.  Most of the information was obtained from her sister who is close by.  Patient is a poor historian.  Patient admitted on the medical floor twice since the last visit because of exacerbation of COPD.  His sister endorsed that his agitation, anger and paranoia is a stable.  He does talk to himself sometimes but denies any crying spells, insomnia, anger issues or irritability.  He denies any suicidal thoughts.  He is not taking trazodone since he is sleeping good and at this time does not require any medication.  His appetite is okay.  His sister endorsed when he is having shortness of breath when he does not like to eat.  Again we did talk about reducing the dose of Risperdal but sister and the patient elected to change the medication since they are working.  I spoke to patient who denies any suicidal thoughts, homicidal thoughts and feels comfortable with the current medication.  Past Psychiatric History:Reviewed. H/O schizophrenia and admitted at [facility] years ago. H/O paranoid, delusional, disorganized behavior and having mood swings.   Psychiatric Specialty Exam: Physical Exam  Review of Systems  There were no vitals taken for this visit.There is no height or weight on file to calculate BMI.  General Appearance: NA  Eye Contact:  NA  Speech:  Slow  Volume:  Decreased  Mood:  Euthymic   Affect:  NA  Thought Process:  Descriptions of Associations: Circumstantial  Orientation:  Full (Time, Place, and Person)  Thought Content:  Hallucinations: Auditory talk to himself but chronic  Suicidal Thoughts:  No  Homicidal Thoughts:  No  Memory:  Immediate;   Fair Recent;   Fair Remote;   Fair  Judgement:  Fair  Insight:  Fair  Psychomotor Activity:  NA  Concentration:  Concentration: Fair and Attention Span: Fair  Recall:  [employer] of Knowledge:  Fair  Language:  Fair  Akathisia:  No  Handed:  Right  AIMS (if indicated):     Assets:  Desire for Improvement Housing Social Support  ADL's:  Intact  Cognition:  Impaired,  Mild  Sleep:   good      Assessment and Plan: Schizophrenia chronic paranoid type.  Primary insomnia.  I reviewed blood work results from recent hospitalization because of exacerbation of COPD.  Patient and his sister does not want to change or reduce the medication since patient is doing well.  Continue Risperdal 4 mg twice a day, Cogentin 2 mg twice a day, Remeron 30 mg at bedtime.  Patient does not need trazodone at this time since he is sleeping good.  Recommended to call us back if he is any question or any concerns.  Follow-up in 3 months.  Follow Up Instructions:    I discussed the assessment and treatment plan with the patient. The patient was provided an opportunity to ask questions and all were answered. The patient agreed with the  plan and demonstrated an understanding of the instructions.   The patient was advised to call back or seek an in-person evaluation if the symptoms worsen or if the condition fails to improve as anticipated.  I provided 20 minutes of non-face-to-face time during this encounter.   Kathlee Nations, MD

## 2019-08-03 DIAGNOSIS — I10 Essential (primary) hypertension: Secondary | ICD-10-CM | POA: Diagnosis not present

## 2019-08-03 DIAGNOSIS — J9611 Chronic respiratory failure with hypoxia: Secondary | ICD-10-CM | POA: Diagnosis not present

## 2019-08-03 DIAGNOSIS — E785 Hyperlipidemia, unspecified: Secondary | ICD-10-CM | POA: Diagnosis not present

## 2019-08-03 DIAGNOSIS — J439 Emphysema, unspecified: Secondary | ICD-10-CM | POA: Diagnosis not present

## 2019-08-03 DIAGNOSIS — F200 Paranoid schizophrenia: Secondary | ICD-10-CM | POA: Diagnosis not present

## 2019-08-03 DIAGNOSIS — F17210 Nicotine dependence, cigarettes, uncomplicated: Secondary | ICD-10-CM | POA: Diagnosis not present

## 2019-08-08 DIAGNOSIS — F200 Paranoid schizophrenia: Secondary | ICD-10-CM | POA: Diagnosis not present

## 2019-08-08 DIAGNOSIS — I10 Essential (primary) hypertension: Secondary | ICD-10-CM | POA: Diagnosis not present

## 2019-08-08 DIAGNOSIS — J9611 Chronic respiratory failure with hypoxia: Secondary | ICD-10-CM | POA: Diagnosis not present

## 2019-08-08 DIAGNOSIS — J439 Emphysema, unspecified: Secondary | ICD-10-CM | POA: Diagnosis not present

## 2019-08-08 DIAGNOSIS — E785 Hyperlipidemia, unspecified: Secondary | ICD-10-CM | POA: Diagnosis not present

## 2019-08-08 DIAGNOSIS — F17210 Nicotine dependence, cigarettes, uncomplicated: Secondary | ICD-10-CM | POA: Diagnosis not present

## 2019-08-10 DIAGNOSIS — G4700 Insomnia, unspecified: Secondary | ICD-10-CM | POA: Diagnosis not present

## 2019-08-10 DIAGNOSIS — Z9181 History of falling: Secondary | ICD-10-CM | POA: Diagnosis not present

## 2019-08-10 DIAGNOSIS — E785 Hyperlipidemia, unspecified: Secondary | ICD-10-CM | POA: Diagnosis not present

## 2019-08-10 DIAGNOSIS — Z9981 Dependence on supplemental oxygen: Secondary | ICD-10-CM | POA: Diagnosis not present

## 2019-08-10 DIAGNOSIS — F17210 Nicotine dependence, cigarettes, uncomplicated: Secondary | ICD-10-CM | POA: Diagnosis not present

## 2019-08-10 DIAGNOSIS — F200 Paranoid schizophrenia: Secondary | ICD-10-CM | POA: Diagnosis not present

## 2019-08-10 DIAGNOSIS — J9611 Chronic respiratory failure with hypoxia: Secondary | ICD-10-CM | POA: Diagnosis not present

## 2019-08-10 DIAGNOSIS — Z7951 Long term (current) use of inhaled steroids: Secondary | ICD-10-CM | POA: Diagnosis not present

## 2019-08-10 DIAGNOSIS — J439 Emphysema, unspecified: Secondary | ICD-10-CM | POA: Diagnosis not present

## 2019-08-10 DIAGNOSIS — I10 Essential (primary) hypertension: Secondary | ICD-10-CM | POA: Diagnosis not present

## 2019-08-11 DIAGNOSIS — J439 Emphysema, unspecified: Secondary | ICD-10-CM | POA: Diagnosis not present

## 2019-08-11 DIAGNOSIS — J9611 Chronic respiratory failure with hypoxia: Secondary | ICD-10-CM | POA: Diagnosis not present

## 2019-08-11 DIAGNOSIS — F200 Paranoid schizophrenia: Secondary | ICD-10-CM | POA: Diagnosis not present

## 2019-08-11 DIAGNOSIS — E785 Hyperlipidemia, unspecified: Secondary | ICD-10-CM | POA: Diagnosis not present

## 2019-08-11 DIAGNOSIS — I10 Essential (primary) hypertension: Secondary | ICD-10-CM | POA: Diagnosis not present

## 2019-08-11 DIAGNOSIS — G4700 Insomnia, unspecified: Secondary | ICD-10-CM | POA: Diagnosis not present

## 2019-08-16 DIAGNOSIS — E559 Vitamin D deficiency, unspecified: Secondary | ICD-10-CM | POA: Diagnosis not present

## 2019-08-16 DIAGNOSIS — Z125 Encounter for screening for malignant neoplasm of prostate: Secondary | ICD-10-CM | POA: Diagnosis not present

## 2019-08-16 DIAGNOSIS — F319 Bipolar disorder, unspecified: Secondary | ICD-10-CM | POA: Diagnosis not present

## 2019-08-16 DIAGNOSIS — J441 Chronic obstructive pulmonary disease with (acute) exacerbation: Secondary | ICD-10-CM | POA: Diagnosis not present

## 2019-08-16 DIAGNOSIS — Z79899 Other long term (current) drug therapy: Secondary | ICD-10-CM | POA: Diagnosis not present

## 2019-08-16 DIAGNOSIS — N411 Chronic prostatitis: Secondary | ICD-10-CM | POA: Diagnosis not present

## 2019-08-16 DIAGNOSIS — K2100 Gastro-esophageal reflux disease with esophagitis, without bleeding: Secondary | ICD-10-CM | POA: Diagnosis not present

## 2019-08-16 DIAGNOSIS — E7800 Pure hypercholesterolemia, unspecified: Secondary | ICD-10-CM | POA: Diagnosis not present

## 2019-08-16 DIAGNOSIS — K267 Chronic duodenal ulcer without hemorrhage or perforation: Secondary | ICD-10-CM | POA: Diagnosis not present

## 2019-08-16 DIAGNOSIS — M1990 Unspecified osteoarthritis, unspecified site: Secondary | ICD-10-CM | POA: Diagnosis not present

## 2019-08-16 DIAGNOSIS — M2550 Pain in unspecified joint: Secondary | ICD-10-CM | POA: Diagnosis not present

## 2019-08-16 DIAGNOSIS — I119 Hypertensive heart disease without heart failure: Secondary | ICD-10-CM | POA: Diagnosis not present

## 2019-08-17 DIAGNOSIS — J9611 Chronic respiratory failure with hypoxia: Secondary | ICD-10-CM | POA: Diagnosis not present

## 2019-08-17 DIAGNOSIS — I10 Essential (primary) hypertension: Secondary | ICD-10-CM | POA: Diagnosis not present

## 2019-08-17 DIAGNOSIS — J439 Emphysema, unspecified: Secondary | ICD-10-CM | POA: Diagnosis not present

## 2019-08-17 DIAGNOSIS — E785 Hyperlipidemia, unspecified: Secondary | ICD-10-CM | POA: Diagnosis not present

## 2019-08-17 DIAGNOSIS — F200 Paranoid schizophrenia: Secondary | ICD-10-CM | POA: Diagnosis not present

## 2019-08-17 DIAGNOSIS — G4700 Insomnia, unspecified: Secondary | ICD-10-CM | POA: Diagnosis not present

## 2019-08-19 ENCOUNTER — Other Ambulatory Visit: Payer: Self-pay | Admitting: *Deleted

## 2019-08-19 DIAGNOSIS — J9611 Chronic respiratory failure with hypoxia: Secondary | ICD-10-CM | POA: Diagnosis not present

## 2019-08-19 DIAGNOSIS — E785 Hyperlipidemia, unspecified: Secondary | ICD-10-CM | POA: Diagnosis not present

## 2019-08-19 DIAGNOSIS — F200 Paranoid schizophrenia: Secondary | ICD-10-CM | POA: Diagnosis not present

## 2019-08-19 DIAGNOSIS — I10 Essential (primary) hypertension: Secondary | ICD-10-CM | POA: Diagnosis not present

## 2019-08-19 DIAGNOSIS — G4700 Insomnia, unspecified: Secondary | ICD-10-CM | POA: Diagnosis not present

## 2019-08-19 DIAGNOSIS — J439 Emphysema, unspecified: Secondary | ICD-10-CM | POA: Diagnosis not present

## 2019-08-19 NOTE — Patient Outreach (Signed)
[facility]) Care Management  08/19/2019  Blake Mcknight 12-14-1955 [id number]   Call placed to member's sister to follow up on current management of care and recent visit with PCP.  No answer, HIPAA compliant voice message left.  Will follow up within the next 3-4 business days.  [name], [place], MSN [place] [phone]

## 2019-08-23 DIAGNOSIS — E785 Hyperlipidemia, unspecified: Secondary | ICD-10-CM | POA: Diagnosis not present

## 2019-08-23 DIAGNOSIS — J9611 Chronic respiratory failure with hypoxia: Secondary | ICD-10-CM | POA: Diagnosis not present

## 2019-08-23 DIAGNOSIS — I10 Essential (primary) hypertension: Secondary | ICD-10-CM | POA: Diagnosis not present

## 2019-08-23 DIAGNOSIS — J439 Emphysema, unspecified: Secondary | ICD-10-CM | POA: Diagnosis not present

## 2019-08-23 DIAGNOSIS — F200 Paranoid schizophrenia: Secondary | ICD-10-CM | POA: Diagnosis not present

## 2019-08-23 DIAGNOSIS — G4700 Insomnia, unspecified: Secondary | ICD-10-CM | POA: Diagnosis not present

## 2019-08-24 ENCOUNTER — Other Ambulatory Visit: Payer: Self-pay | Admitting: *Deleted

## 2019-08-24 DIAGNOSIS — J9611 Chronic respiratory failure with hypoxia: Secondary | ICD-10-CM | POA: Diagnosis not present

## 2019-08-24 DIAGNOSIS — J439 Emphysema, unspecified: Secondary | ICD-10-CM | POA: Diagnosis not present

## 2019-08-24 DIAGNOSIS — G4700 Insomnia, unspecified: Secondary | ICD-10-CM | POA: Diagnosis not present

## 2019-08-24 DIAGNOSIS — E785 Hyperlipidemia, unspecified: Secondary | ICD-10-CM | POA: Diagnosis not present

## 2019-08-24 DIAGNOSIS — I10 Essential (primary) hypertension: Secondary | ICD-10-CM | POA: Diagnosis not present

## 2019-08-24 DIAGNOSIS — F200 Paranoid schizophrenia: Secondary | ICD-10-CM | POA: Diagnosis not present

## 2019-08-24 NOTE — Patient Outreach (Signed)
[facility] Care Management  08/24/2019  MARLON SULEIMAN 09-16-1955 [id number]   [place], RN  Telephone Assessment-Unsuccessful  RN attempted outreach call today however unsuccessful and unable to leave a message.   Plan: Will update involved RN case manager to continue follow up calls accordingly for ongoing case management needs.  Raina Mina, RN Care Management Coordinator [place] Office [phone]

## 2019-08-29 ENCOUNTER — Emergency Department (HOSPITAL_COMMUNITY): Payer: Medicare Other

## 2019-08-29 ENCOUNTER — Encounter (HOSPITAL_COMMUNITY): Payer: Self-pay | Admitting: Emergency Medicine

## 2019-08-29 ENCOUNTER — Other Ambulatory Visit: Payer: Self-pay | Admitting: *Deleted

## 2019-08-29 ENCOUNTER — Emergency Department (HOSPITAL_COMMUNITY)
Admission: EM | Admit: 2019-08-29 | Discharge: 2019-08-29 | Disposition: A | Discharge status: Home / Self Care | Payer: Medicare Other | Attending: Emergency Medicine | Admitting: Emergency Medicine

## 2019-08-29 ENCOUNTER — Other Ambulatory Visit: Payer: Self-pay

## 2019-08-29 DIAGNOSIS — F17210 Nicotine dependence, cigarettes, uncomplicated: Secondary | ICD-10-CM | POA: Diagnosis not present

## 2019-08-29 DIAGNOSIS — Z20822 Contact with and (suspected) exposure to covid-19: Secondary | ICD-10-CM | POA: Diagnosis not present

## 2019-08-29 DIAGNOSIS — I10 Essential (primary) hypertension: Secondary | ICD-10-CM | POA: Insufficient documentation

## 2019-08-29 DIAGNOSIS — Z79899 Other long term (current) drug therapy: Secondary | ICD-10-CM | POA: Insufficient documentation

## 2019-08-29 DIAGNOSIS — J441 Chronic obstructive pulmonary disease with (acute) exacerbation: Secondary | ICD-10-CM

## 2019-08-29 DIAGNOSIS — R0602 Shortness of breath: Secondary | ICD-10-CM | POA: Diagnosis not present

## 2019-08-29 LAB — CBC WITH DIFFERENTIAL/PLATELET
Abs Immature Granulocytes: 0.02 10*3/uL (ref 0.00–0.07)
Basophils Absolute: 0.1 10*3/uL (ref 0.0–0.1)
Basophils Relative: 1 %
Eosinophils Absolute: 0.5 10*3/uL (ref 0.0–0.5)
Eosinophils Relative: 7 %
HCT: 43.3 % (ref 39.0–52.0)
Hemoglobin: 14.4 g/dL (ref 13.0–17.0)
Immature Granulocytes: 0 %
Lymphocytes Relative: 35 %
Lymphs Abs: 2.7 10*3/uL (ref 0.7–4.0)
MCH: 30.2 pg (ref 26.0–34.0)
MCHC: 33.3 g/dL (ref 30.0–36.0)
MCV: 90.8 fL (ref 80.0–100.0)
Monocytes Absolute: 0.7 10*3/uL (ref 0.1–1.0)
Monocytes Relative: 9 %
Neutro Abs: 3.6 10*3/uL (ref 1.7–7.7)
Neutrophils Relative %: 48 %
Platelets: 266 10*3/uL (ref 150–400)
RBC: 4.77 MIL/uL (ref 4.22–5.81)
RDW: 12 % (ref 11.5–15.5)
WBC: 7.6 10*3/uL (ref 4.0–10.5)
nRBC: 0 % (ref 0.0–0.2)

## 2019-08-29 LAB — BASIC METABOLIC PANEL
Anion gap: 11 (ref 5–15)
BUN: <5 mg/dL — ABNORMAL LOW (ref 8–23)
CO2: 23 mmol/L (ref 22–32)
Calcium: 8.8 mg/dL — ABNORMAL LOW (ref 8.9–10.3)
Chloride: 100 mmol/L (ref 98–111)
Creatinine, Ser: 0.67 mg/dL (ref 0.61–1.24)
GFR calc Af Amer: >60 mL/min (ref >60)
GFR calc non Af Amer: >60 mL/min (ref >60)
Glucose, Bld: 109 mg/dL — ABNORMAL HIGH (ref 70–99)
Potassium: 5.2 mmol/L — ABNORMAL HIGH (ref 3.5–5.1)
Sodium: 134 mmol/L — ABNORMAL LOW (ref 135–145)

## 2019-08-29 LAB — SARS CORONAVIRUS 2 BY RT PCR (HOSPITAL ORDER, PERFORMED IN ~~LOC~~ HOSPITAL LAB): SARS Coronavirus 2: NEGATIVE

## 2019-08-29 MED ORDER — AEROCHAMBER PLUS FLO-VU LARGE MISC
Status: AC
  Filled 2019-08-29: qty 1

## 2019-08-29 MED ORDER — ALBUTEROL SULFATE HFA 108 (90 BASE) MCG/ACT IN AERS
8.0000 | INHALATION_SPRAY | Freq: Once | RESPIRATORY_TRACT | Status: AC
  Administered 2019-08-29: 8 via RESPIRATORY_TRACT
  Filled 2019-08-29: qty 6.7

## 2019-08-29 MED ORDER — PREDNISONE 20 MG PO TABS
40.0000 mg | ORAL_TABLET | Freq: Every day | ORAL | 0 refills | Status: DC
Start: 2019-08-29 — End: 2019-09-05

## 2019-08-29 MED ORDER — AZITHROMYCIN 250 MG PO TABS
250.0000 mg | ORAL_TABLET | Freq: Every day | ORAL | 0 refills | Status: DC
Start: 2019-08-29 — End: 2020-06-16

## 2019-08-29 MED ORDER — ALBUTEROL SULFATE (2.5 MG/3ML) 0.083% IN NEBU
5.0000 mg | INHALATION_SOLUTION | Freq: Once | RESPIRATORY_TRACT | Status: AC
  Administered 2019-08-29: 5 mg via RESPIRATORY_TRACT
  Filled 2019-08-29: qty 6

## 2019-08-29 NOTE — Patient Outreach (Signed)
[facility]) Care Management  08/29/2019  TAYSHUN GAPPA [birth year] [id number]   Outreach attempt #3, unsuccessful, HIPAA compliant voice message left for sister.  Will send unsuccessful outreach letter and follow up within the next 4 weeks.  If unsuccessful, will close case at that time.  [name], [place], MSN [place] [phone]

## 2019-08-29 NOTE — ED Provider Notes (Signed)
[facility] EMERGENCY DEPARTMENT Provider Note   CSN: [id number] Arrival date & time: 08/29/19  1448     History Chief Complaint  Patient presents with  . Respiratory Distress    Blake Mcknight is a 64 y.o. male.  64yo M w/ PMH including COPD, HTN, HLD, schizophrenia who p/w SOB.  Patient reports a 1 week history of progressively worsening shortness of breath.  He states he was trying to avoid coming to the hospital.  He reports associated cough productive of brown phlegm, no fevers or chest pain.  He has been using inhalers at home with only temporary relief.  He used a breathing treatment then called EMS.  EMS gave him albuterol, ipratropium, 0.3mg  IM epi, 125mg  solumedrol, and 2g Mg in route. He reports slight improvement since then but still feels SOB. No vomiting, diarrhea, sick contacts, or complaints of pain.   The history is provided by the patient.       Past Medical History:  Diagnosis Date  . COPD (chronic obstructive pulmonary disease) (HCC)    with bullous emphysema.   Marland Kitchen Heavy cigarette smoker before 2003   pt claims only 10 cigs per day, never heavier amounts.   Marland Kitchen HLD (hyperlipidemia)   . Hypertension   . Schizophrenia ([place]) 06/28/2013   This is a chronic condition and he lives with family    Patient Active Problem List   Diagnosis Date Noted  . Hypoxia   . COPD exacerbation ([place]) 06/05/2019  . Do not resuscitate 02/18/2019  . Bullous emphysema ([place]) 07/26/2013  . Schizophrenia ([place]) 06/28/2013  . Tobacco use disorder 06/06/2013  . Abdominal pain 06/03/2013  . HTN (hypertension) 06/03/2013  . HLD (hyperlipidemia) 06/03/2013  . Chronic obstructive pulmonary disease with acute exacerbation ([place]) 06/03/2013  . Calculus of bile duct without mention of cholecystitis or obstruction 06/03/2013    Past Surgical History:  Procedure Laterality Date  . CHOLECYSTECTOMY N/A 06/06/2013   Procedure: LAPAROSCOPIC CHOLECYSTECTOMY WITH ATTEMPTED  INTRAOPERATIVE CHOLANGIOGRAM;  Surgeon: Zenovia Jarred, MD;  Location: [facility];  Service: General;  Laterality: N/A;  . ERCP N/A 06/03/2013   Procedure: ENDOSCOPIC RETROGRADE CHOLANGIOPANCREATOGRAPHY (ERCP);  Surgeon: Ladene Artist, MD;  [facility] END[facility]COPY;  Service: Endoscopy;  Laterality: N/A;  . NO PAST SURGERIES         Family History  Problem Relation Age of Onset  . Diabetes Mellitus II Mother   . Diabetes Mother   . Heart disease Father   . Stroke Maternal Aunt   . CAD Neg Hx     Social History   Tobacco Use  . Smoking status: Current Every Day Smoker    Packs/day: 1.00    Years: 41.00    Pack years: 41.00    Types: Cigarettes  . Smokeless tobacco: Never Used  . Tobacco comment: Gets 4 puffs a day.   Vaping Use  . Vaping Use: Never used  Substance Use Topics  . Alcohol use: No  . Drug use: No    Home Medications Prior to Admission medications   Medication Sig Start Date End Date Taking? Authorizing Provider  albuterol (PROVENTIL) (2.5 MG/3ML) 0.083% nebulizer solution Take 3 mLs (2.5 mg total) by nebulization every 3 (three) hours as needed for wheezing or shortness of breath. 02/20/19   Aline August, MD  albuterol (VENTOLIN HFA) 108 (90 Base) MCG/ACT inhaler Inhale 2 puffs into the lungs every 4 (four) hours as needed for wheezing or shortness of breath. 02/20/19  Aline August, MD  Ascorbic Acid (VITAMIN C PO) Take 1 tablet by mouth 3 (three) times a week.     [provider]  atorvastatin (LIPITOR) 10 MG tablet Take 10 mg by mouth daily at 6 PM.  09/21/15   [provider]  azithromycin (ZITHROMAX Z-PAK) 250 MG tablet Take 1 tablet (250 mg total) by mouth daily. 500mg  PO day 1, then 250mg  PO days 205 08/29/19   Shiza Thelen, Wenda Overland, MD  benztropine (COGENTIN) 2 MG tablet Take 1 tablet (2 mg total) by mouth 2 (two) times daily. 08/02/19   Arfeen, Arlyce Harman, MD  carvedilol (COREG) 3.125 MG tablet Take 1 tablet (3.125 mg total) by mouth 2 (two)  times daily with a meal. Patient taking differently: Take 3.125 mg by mouth every morning.  02/20/19   Aline August, MD  cetirizine (ZYRTEC) 10 MG tablet Take 10 mg by mouth daily as needed for allergies or rhinitis.    [provider]  cholecalciferol (VITAMIN D) 25 MCG (1000 UNIT) tablet Take 4,000 Units by mouth every evening.     [provider]  Cyanocobalamin (B-12 PO) Take 1 tablet by mouth every other day.     [provider]  enalapril (VASOTEC) 10 MG tablet Take 10 mg by mouth daily. 06/04/16   [provider]  famotidine (PEPCID) 40 MG tablet Take 40 mg by mouth 2 (two) times daily.  12/28/18   [provider]  Fluticasone-Salmeterol (ADVAIR DISKUS) 250-50 MCG/DOSE AEPB Inhale 1 puff into the lungs 2 (two) times daily. 06/23/19 09/21/19  Harvie Heck, MD  guaiFENesin-dextromethorphan (ROBITUSSIN DM) 100-10 MG/5ML syrup Take 10 mLs by mouth every 4 (four) hours as needed for cough. 02/20/19   Aline August, MD  mirtazapine (REMERON) 30 MG tablet Take 1 tablet (30 mg total) by mouth at bedtime. 08/02/19 08/01/20  Arfeen, Arlyce Harman, MD  montelukast (SINGULAIR) 10 MG tablet Take 1 tablet (10 mg total) by mouth daily. 06/23/19 09/21/19  Harvie Heck, MD  multivitamin (ONE-A-DAY MEN'S) TABS tablet Take 1 tablet by mouth daily.    [provider]  nicotine (NICODERM CQ - DOSED IN MG/24 HOURS) 14 mg/24hr patch Place 1 patch (14 mg total) onto the skin daily. 02/20/19   Aline August, MD  omeprazole (PRILOSEC) 20 MG capsule Take 1 capsule (20 mg total) by mouth daily. Patient not taking: Reported on 06/20/2019 03/14/19   Domenic Moras, PA-C  OXYGEN Inhale 2 L/min into the lungs continuous.     [provider]  polyethylene glycol (MIRALAX / GLYCOLAX) 17 g packet Take 17 g by mouth daily as needed for mild constipation.    [provider]  predniSONE (DELTASONE) 20 MG tablet Take 2 tablets (40 mg total) by mouth daily. 08/29/19   Yahir Tavano, Wenda Overland, MD  risperidone (RISPERDAL) 4 MG tablet Take 1 tablet (4 mg total) by mouth 2 (two) times daily. 08/02/19   Arfeen, Arlyce Harman, MD  sodium chloride (OCEAN) 0.65 % SOLN nasal spray Place 1 spray into both nostrils as needed for congestion.    [provider]  SPIRULINA PO Take 1 capsule by mouth daily.    [provider]  traZODone (DESYREL) 50 MG tablet Take 1 tablet (50 mg total) by mouth at bedtime as needed for sleep. 05/09/19   Arfeen, Arlyce Harman, MD    Allergies    Patient has no known allergies.  Review of Systems   Review of Systems All other systems reviewed and are  negative except that which was mentioned in HPI  Physical Exam Updated Vital Signs BP 127/88   Pulse 94   Temp (!) 97.5 F (36.4 C) (Oral)   Resp (!) 21   Ht 5\' 5"  (1.651 m)   Wt 66.6 kg   SpO2 96%   BMI 24.43 kg/m   Physical Exam Vitals and nursing note reviewed.  Constitutional:      General: He is not in acute distress.    Appearance: He is well-developed.  HENT:     Head: Normocephalic and atraumatic.  Eyes:     Conjunctiva/sclera: Conjunctivae normal.  Cardiovascular:     Rate and Rhythm: Normal rate and regular rhythm.     Heart sounds: Normal heart sounds. No murmur heard.   Pulmonary:     Comments: Mildly dyspneic during conversation, audible wheezing while talking; insp and exp wheezes b/l with diminished BS Abdominal:     General: There is no distension.     Palpations: Abdomen is soft.     Tenderness: There is no abdominal tenderness.  Musculoskeletal:     Cervical back: Neck supple.     Right lower leg: No edema.     Left lower leg: No edema.  Skin:    General: Skin is warm and dry.  Neurological:     Mental Status: He is alert and oriented to person, place, and time.     Comments: Fluent speech  Psychiatric:        Judgment: Judgment normal.     ED Results / Procedures / Treatments   Labs (all labs ordered are listed, but only abnormal results are  displayed) Labs Reviewed  BASIC METABOLIC PANEL - Abnormal; Notable for the following components:      Result Value   Sodium 134 (*)    Potassium 5.2 (*)    Glucose, Bld 109 (*)    BUN <5 (*)    Calcium 8.8 (*)    All other components within normal limits  SARS CORONAVIRUS 2 BY RT PCR (HOSPITAL ORDER, [facility])  CBC WITH DIFFERENTIAL/PLATELET    EKG EKG Interpretation  Date/Time:  Monday August 29 2019 14:58:26 EDT Ventricular Rate:  95 PR Interval:    QRS Duration: 88 QT Interval:  327 QTC Calculation: 411 R Axis:   97 Text Interpretation: Sinus rhythm Right axis deviation No significant change since last tracing Confirmed by Theotis Burrow [id number]) on 08/29/2019 3:00:44 PM   Radiology DG Chest Portable 1 View  Result Date: 08/29/2019 CLINICAL DATA:  Shortness of breath EXAM: PORTABLE CHEST 1 VIEW COMPARISON:  June 20, 2019 FINDINGS: There is emphysematous change throughout the lungs with areas of scarring and bullae. There is no edema or airspace opacity. The heart size is normal. There is diminished pulmonary vascularity, stable, consistent with the underlying emphysema. No adenopathy. No bone lesions. IMPRESSION: Diffuse emphysematous change with areas of scarring, stable. No edema or airspace disease. Stable cardiac silhouette. No evident adenopathy. Electronically Signed   By: Lowella Grip III M.D.   On: 08/29/2019 15:23    Procedures Procedures (including critical care time)  Medications Ordered in ED Medications  AeroChamber Plus Flo-Vu Large MISC (has no administration in time range)  albuterol (VENTOLIN HFA) 108 (90 Base) MCG/ACT inhaler 8 puff (8 puffs Inhalation Given 08/29/19 1523)  albuterol (PROVENTIL) (2.5 MG/3ML) 0.083% nebulizer solution 5 mg (5 mg Nebulization Given 08/29/19 1709)    ED Course  I have reviewed the triage vital signs  and the nursing notes.  Pertinent labs & imaging results that were available during my care  of the patient were reviewed by me and considered in my medical decision making (see chart for details).    MDM Rules/Calculators/A&P                           Pt not in respiratory distress, mildly dyspneic and wheezing but normal O2 sats on RA. Afebrile.  Lab work overall reassuring with normal creatinine, normal CBC, COVID-19 negative.  Chest x-ray with stable emphysematous changes, negative acute.  Gave patient albuterol inhaler and later nebulizer.  Had already received steroids by EMS.  On reassessment, he was breathing comfortably and watching TV, normal respiratory rate and O2 saturation 97 to 98% on room air.  He still has wheezes but normal WOB and stable VS. I feel he is appropriate for outpatient management. Discussed w/ family member treatment plan of steroid burst and azithro given brown sputum production. She assured me that they have albuterol at home and understand need to do on schedule q4-6h for the next few days, then space out as tolerated.  I have reviewed return precautions and they voiced understanding. Final Clinical Impression(s) / ED Diagnoses Final diagnoses:  COPD exacerbation ([place])    Rx / DC Orders ED Discharge Orders         Ordered    predniSONE (DELTASONE) 20 MG tablet  Daily     Discontinue  Reprint     08/29/19 1738    azithromycin (ZITHROMAX Z-PAK) 250 MG tablet  Daily     Discontinue  Reprint     08/29/19 1738           Alandria Butkiewicz, Wenda Overland, MD 08/29/19 1756

## 2019-08-29 NOTE — ED Triage Notes (Signed)
Pt from home, felt SOB this AM. Gave himself 1 breathing treatment, without relief, called EMS. EMS gave 15mg  albuterol, 1mg  hypertrop, 0.3mg  epi IM, 125 mg solumedrol, 2g mag given over 10 min. Pt on NRB on arrival, a/o x4

## 2019-08-31 DIAGNOSIS — I10 Essential (primary) hypertension: Secondary | ICD-10-CM | POA: Diagnosis not present

## 2019-08-31 DIAGNOSIS — F200 Paranoid schizophrenia: Secondary | ICD-10-CM | POA: Diagnosis not present

## 2019-08-31 DIAGNOSIS — G4700 Insomnia, unspecified: Secondary | ICD-10-CM | POA: Diagnosis not present

## 2019-08-31 DIAGNOSIS — J439 Emphysema, unspecified: Secondary | ICD-10-CM | POA: Diagnosis not present

## 2019-08-31 DIAGNOSIS — J9611 Chronic respiratory failure with hypoxia: Secondary | ICD-10-CM | POA: Diagnosis not present

## 2019-08-31 DIAGNOSIS — E785 Hyperlipidemia, unspecified: Secondary | ICD-10-CM | POA: Diagnosis not present

## 2019-09-02 DIAGNOSIS — F200 Paranoid schizophrenia: Secondary | ICD-10-CM | POA: Diagnosis not present

## 2019-09-02 DIAGNOSIS — I10 Essential (primary) hypertension: Secondary | ICD-10-CM | POA: Diagnosis not present

## 2019-09-02 DIAGNOSIS — E785 Hyperlipidemia, unspecified: Secondary | ICD-10-CM | POA: Diagnosis not present

## 2019-09-02 DIAGNOSIS — G4700 Insomnia, unspecified: Secondary | ICD-10-CM | POA: Diagnosis not present

## 2019-09-02 DIAGNOSIS — J439 Emphysema, unspecified: Secondary | ICD-10-CM | POA: Diagnosis not present

## 2019-09-02 DIAGNOSIS — J9611 Chronic respiratory failure with hypoxia: Secondary | ICD-10-CM | POA: Diagnosis not present

## 2019-09-05 ENCOUNTER — Emergency Department (HOSPITAL_COMMUNITY)
Admission: EM | Admit: 2019-09-05 | Discharge: 2019-09-05 | Disposition: A | Discharge status: Home / Self Care | Payer: Medicare Other | Attending: Emergency Medicine | Admitting: Emergency Medicine

## 2019-09-05 ENCOUNTER — Emergency Department (HOSPITAL_COMMUNITY): Payer: Medicare Other

## 2019-09-05 DIAGNOSIS — R0602 Shortness of breath: Secondary | ICD-10-CM | POA: Diagnosis not present

## 2019-09-05 DIAGNOSIS — Z20822 Contact with and (suspected) exposure to covid-19: Secondary | ICD-10-CM | POA: Diagnosis not present

## 2019-09-05 DIAGNOSIS — I10 Essential (primary) hypertension: Secondary | ICD-10-CM | POA: Diagnosis not present

## 2019-09-05 DIAGNOSIS — J441 Chronic obstructive pulmonary disease with (acute) exacerbation: Secondary | ICD-10-CM | POA: Diagnosis not present

## 2019-09-05 DIAGNOSIS — F17210 Nicotine dependence, cigarettes, uncomplicated: Secondary | ICD-10-CM | POA: Diagnosis not present

## 2019-09-05 LAB — TROPONIN I (HIGH SENSITIVITY)
Troponin I (High Sensitivity): 3 ng/L (ref <18)
Troponin I (High Sensitivity): 3 ng/L (ref <18)

## 2019-09-05 LAB — BASIC METABOLIC PANEL
Anion gap: 10 (ref 5–15)
BUN: 10 mg/dL (ref 8–23)
CO2: 25 mmol/L (ref 22–32)
Calcium: 9.1 mg/dL (ref 8.9–10.3)
Chloride: 99 mmol/L (ref 98–111)
Creatinine, Ser: 0.88 mg/dL (ref 0.61–1.24)
GFR calc Af Amer: >60 mL/min (ref >60)
GFR calc non Af Amer: >60 mL/min (ref >60)
Glucose, Bld: 102 mg/dL — ABNORMAL HIGH (ref 70–99)
Potassium: 4.3 mmol/L (ref 3.5–5.1)
Sodium: 134 mmol/L — ABNORMAL LOW (ref 135–145)

## 2019-09-05 LAB — CBC WITH DIFFERENTIAL/PLATELET
Abs Immature Granulocytes: 0.04 10*3/uL (ref 0.00–0.07)
Basophils Absolute: 0.1 10*3/uL (ref 0.0–0.1)
Basophils Relative: 1 %
Eosinophils Absolute: 0.3 10*3/uL (ref 0.0–0.5)
Eosinophils Relative: 3 %
HCT: 45.2 % (ref 39.0–52.0)
Hemoglobin: 14.9 g/dL (ref 13.0–17.0)
Immature Granulocytes: 0 %
Lymphocytes Relative: 18 %
Lymphs Abs: 1.7 10*3/uL (ref 0.7–4.0)
MCH: 30 pg (ref 26.0–34.0)
MCHC: 33 g/dL (ref 30.0–36.0)
MCV: 91.1 fL (ref 80.0–100.0)
Monocytes Absolute: 0.5 10*3/uL (ref 0.1–1.0)
Monocytes Relative: 6 %
Neutro Abs: 6.8 10*3/uL (ref 1.7–7.7)
Neutrophils Relative %: 72 %
Platelets: 238 10*3/uL (ref 150–400)
RBC: 4.96 MIL/uL (ref 4.22–5.81)
RDW: 12.3 % (ref 11.5–15.5)
WBC: 9.4 10*3/uL (ref 4.0–10.5)
nRBC: 0 % (ref 0.0–0.2)

## 2019-09-05 LAB — SARS CORONAVIRUS 2 BY RT PCR (HOSPITAL ORDER, PERFORMED IN ~~LOC~~ HOSPITAL LAB): SARS Coronavirus 2: NEGATIVE

## 2019-09-05 MED ORDER — AEROCHAMBER PLUS FLO-VU LARGE MISC
Status: AC
  Administered 2019-09-05: 1
  Filled 2019-09-05: qty 1

## 2019-09-05 MED ORDER — IPRATROPIUM-ALBUTEROL 0.5-2.5 (3) MG/3ML IN SOLN
3.0000 mL | Freq: Once | RESPIRATORY_TRACT | Status: AC
  Administered 2019-09-05: 3 mL via RESPIRATORY_TRACT
  Filled 2019-09-05: qty 3

## 2019-09-05 MED ORDER — AEROCHAMBER PLUS FLO-VU LARGE MISC
1.0000 | Freq: Once | Status: AC
  Administered 2019-09-05: 1

## 2019-09-05 MED ORDER — ALBUTEROL SULFATE HFA 108 (90 BASE) MCG/ACT IN AERS
4.0000 | INHALATION_SPRAY | Freq: Once | RESPIRATORY_TRACT | Status: AC
  Administered 2019-09-05: 4 via RESPIRATORY_TRACT
  Filled 2019-09-05: qty 6.7

## 2019-09-05 MED ORDER — MAGNESIUM SULFATE 2 GM/50ML IV SOLN
2.0000 g | Freq: Once | INTRAVENOUS | Status: AC
  Administered 2019-09-05: 2 g via INTRAVENOUS
  Filled 2019-09-05: qty 50

## 2019-09-05 MED ORDER — IPRATROPIUM-ALBUTEROL 0.5-2.5 (3) MG/3ML IN SOLN
3.0000 mL | Freq: Once | RESPIRATORY_TRACT | Status: AC
  Administered 2019-09-05: 3 mL via RESPIRATORY_TRACT

## 2019-09-05 MED ORDER — DOXYCYCLINE HYCLATE 100 MG PO CAPS
100.0000 mg | ORAL_CAPSULE | Freq: Two times a day (BID) | ORAL | 0 refills | Status: AC
Start: 2019-09-05 — End: 2019-09-12

## 2019-09-05 MED ORDER — DOXYCYCLINE HYCLATE 100 MG PO TABS
100.0000 mg | ORAL_TABLET | Freq: Once | ORAL | Status: AC
  Administered 2019-09-05: 100 mg via ORAL
  Filled 2019-09-05: qty 1

## 2019-09-05 MED ORDER — PREDNISONE 50 MG PO TABS: 50.0000 mg | ORAL_TABLET | Freq: Every day | ORAL | 0 refills | Status: AC

## 2019-09-05 NOTE — ED Notes (Signed)
Pt ambulatory on 2L [place], as pt is typically on 2L [place] at home. SpO2 between 95% and 98% while ambulating. Pt denies any SOB or dizziness.

## 2019-09-05 NOTE — Discharge Instructions (Addendum)
You were given a prescription for antibiotics. Please take the antibiotic prescription fully.   Please follow up with your primary care provider within 3-5 days for re-evaluation of your symptoms.  Please return to the emergency department for any new or worsening symptoms.

## 2019-09-05 NOTE — ED Triage Notes (Signed)
Pt arrived via [facility] from home c/o incresing SOB since last night. Pt has hx of COPD and was seen recently and treated for same complaint. Pt recv'd 125 solumedrol and duo neb PTA. PT also took one breathing tx at home before EMS arrived. Pt in no apparent distress at this time. Normally on 2Lpm o2 at home; placed on 3 Lpm w/ EMS. Denies SOB or CP at this time. Alert and oriented x4.

## 2019-09-05 NOTE — ED Provider Notes (Signed)
[facility] EMERGENCY DEPARTMENT Provider Note   CSN: [id number] Arrival date & time: 09/05/19  1120     History Chief Complaint  Patient presents with  . Shortness of Breath    Blake Mcknight is a 64 y.o. male.  HPI   Pt is a 64 y/o male with a h/o COPD, HLD, HTN, schizophrenia who presents to the ED today for eval of SOB for the last month which worsened yesterday. States he has had increased cough with sputum production. Denies chest pain or pleuritic pain. Denies any fevers. Wears 2L O2 at home. States he lives with his sister and she has had to increased his oxygen at home.   Additional history received from EMS.  Patient was placed on 2 L O2 in route and was given 125 Solu-Medrol and a DuoNeb.  Past Medical History:  Diagnosis Date  . COPD (chronic obstructive pulmonary disease) (HCC)    with bullous emphysema.   Marland Kitchen Heavy cigarette smoker before 2003   pt claims only 10 cigs per day, never heavier amounts.   Marland Kitchen HLD (hyperlipidemia)   . Hypertension   . Schizophrenia ([place]) 06/28/2013   This is a chronic condition and he lives with family    Patient Active Problem List   Diagnosis Date Noted  . Hypoxia   . COPD exacerbation ([place]) 06/05/2019  . Do not resuscitate 02/18/2019  . Bullous emphysema ([place]) 07/26/2013  . Schizophrenia ([place]) 06/28/2013  . Tobacco use disorder 06/06/2013  . Abdominal pain 06/03/2013  . HTN (hypertension) 06/03/2013  . HLD (hyperlipidemia) 06/03/2013  . Chronic obstructive pulmonary disease with acute exacerbation ([place]) 06/03/2013  . Calculus of bile duct without mention of cholecystitis or obstruction 06/03/2013    Past Surgical History:  Procedure Laterality Date  . CHOLECYSTECTOMY N/A 06/06/2013   Procedure: LAPAROSCOPIC CHOLECYSTECTOMY WITH ATTEMPTED INTRAOPERATIVE CHOLANGIOGRAM;  Surgeon: Zenovia Jarred, MD;  Location: [facility];  Service: General;  Laterality: N/A;  . ERCP N/A 06/03/2013   Procedure: ENDOSCOPIC  RETROGRADE CHOLANGIOPANCREATOGRAPHY (ERCP);  Surgeon: Ladene Artist, MD;  Location: [facility] ENDOSCOPY;  Service: Endoscopy;  Laterality: N/A;  . NO PAST SURGERIES         Family History  Problem Relation Age of Onset  . Diabetes Mellitus II Mother   . Diabetes Mother   . Heart disease Father   . Stroke Maternal Aunt   . CAD Neg Hx     Social History   Tobacco Use  . Smoking status: Current Every Day Smoker    Packs/day: 1.00    Years: 41.00    Pack years: 41.00    Types: Cigarettes  . Smokeless tobacco: Never Used  . Tobacco comment: Gets 4 puffs a day.   Vaping Use  . Vaping Use: Never used  Substance Use Topics  . Alcohol use: No  . Drug use: No    Home Medications Prior to Admission medications   Medication Sig Start Date End Date Taking? Authorizing Provider  albuterol (PROVENTIL) (2.5 MG/3ML) 0.083% nebulizer solution Take 3 mLs (2.5 mg total) by nebulization every 3 (three) hours as needed for wheezing or shortness of breath. 02/20/19   Aline August, MD  albuterol (VENTOLIN HFA) 108 (90 Base) MCG/ACT inhaler Inhale 2 puffs into the lungs every 4 (four) hours as needed for wheezing or shortness of breath. 02/20/19   Aline August, MD  Ascorbic Acid (VITAMIN C PO) Take 1 tablet by mouth 3 (three) times a week.  [provider]  atorvastatin (LIPITOR) 10 MG tablet Take 10 mg by mouth daily at 6 PM.  09/21/15   [provider]  azithromycin (ZITHROMAX Z-PAK) 250 MG tablet Take 1 tablet (250 mg total) by mouth daily. 500mg  PO day 1, then 250mg  PO days 205 08/29/19   Little, Wenda Overland, MD  benztropine (COGENTIN) 2 MG tablet Take 1 tablet (2 mg total) by mouth 2 (two) times daily. 08/02/19   Arfeen, Arlyce Harman, MD  carvedilol (COREG) 3.125 MG tablet Take 1 tablet (3.125 mg total) by mouth 2 (two) times daily with a meal. Patient taking differently: Take 3.125 mg by mouth every morning.  02/20/19   Aline August, MD  cetirizine (ZYRTEC) 10 MG tablet Take 10 mg  by mouth daily as needed for allergies or rhinitis.    [provider]  cholecalciferol (VITAMIN D) 25 MCG (1000 UNIT) tablet Take 4,000 Units by mouth every evening.     [provider]  Cyanocobalamin (B-12 PO) Take 1 tablet by mouth every other day.     [provider]  doxycycline (VIBRAMYCIN) 100 MG capsule Take 1 capsule (100 mg total) by mouth 2 (two) times daily for 7 days. 09/05/19 09/12/19  Brynleigh Sequeira S, PA-C  enalapril (VASOTEC) 10 MG tablet Take 10 mg by mouth daily. 06/04/16   [provider]  famotidine (PEPCID) 40 MG tablet Take 40 mg by mouth 2 (two) times daily.  12/28/18   [provider]  Fluticasone-Salmeterol (ADVAIR DISKUS) 250-50 MCG/DOSE AEPB Inhale 1 puff into the lungs 2 (two) times daily. 06/23/19 09/21/19  Harvie Heck, MD  guaiFENesin-dextromethorphan (ROBITUSSIN DM) 100-10 MG/5ML syrup Take 10 mLs by mouth every 4 (four) hours as needed for cough. 02/20/19   Aline August, MD  mirtazapine (REMERON) 30 MG tablet Take 1 tablet (30 mg total) by mouth at bedtime. 08/02/19 08/01/20  Arfeen, Arlyce Harman, MD  montelukast (SINGULAIR) 10 MG tablet Take 1 tablet (10 mg total) by mouth daily. 06/23/19 09/21/19  Harvie Heck, MD  multivitamin (ONE-A-DAY MEN'S) TABS tablet Take 1 tablet by mouth daily.    [provider]  nicotine (NICODERM CQ - DOSED IN MG/24 HOURS) 14 mg/24hr patch Place 1 patch (14 mg total) onto the skin daily. 02/20/19   Aline August, MD  omeprazole (PRILOSEC) 20 MG capsule Take 1 capsule (20 mg total) by mouth daily. Patient not taking: Reported on 06/20/2019 03/14/19   Domenic Moras, PA-C  OXYGEN Inhale 2 L/min into the lungs continuous.     [provider]  polyethylene glycol (MIRALAX / GLYCOLAX) 17 g packet Take 17 g by mouth daily as needed for mild constipation.    [provider]  predniSONE (DELTASONE) 20 MG tablet Take 2 tablets (40 mg total) by mouth daily. 08/29/19   Little, Wenda Overland, MD    risperidone (RISPERDAL) 4 MG tablet Take 1 tablet (4 mg total) by mouth 2 (two) times daily. 08/02/19   Arfeen, Arlyce Harman, MD  sodium chloride (OCEAN) 0.65 % SOLN nasal spray Place 1 spray into both nostrils as needed for congestion.    [provider]  SPIRULINA PO Take 1 capsule by mouth daily.    [provider]  traZODone (DESYREL) 50 MG tablet Take 1 tablet (50 mg total) by mouth at bedtime as needed for sleep. 05/09/19   Arfeen, Arlyce Harman, MD    Allergies    Patient has no known allergies.  Review of Systems   Review of Systems  Constitutional: Negative for chills and fever.  HENT: Negative for ear pain and sore throat.   Eyes: Negative for pain and visual disturbance.  Respiratory: Positive for cough and shortness of breath.   Cardiovascular: Negative for chest pain, palpitations and leg swelling.  Gastrointestinal: Negative for abdominal pain, constipation, diarrhea, nausea and vomiting.  Genitourinary: Negative for dysuria and hematuria.  Musculoskeletal: Negative for back pain.  Skin: Negative for color change and rash.  Neurological: Negative for seizures, syncope and headaches.  All other systems reviewed and are negative.   Physical Exam Updated Vital Signs BP (!) 144/86 (BP Location: Right Arm)   Pulse 90   Temp 97.9 F (36.6 C) (Oral)   Resp 18   SpO2 99%   Physical Exam Vitals and nursing note reviewed.  Constitutional:      Appearance: He is well-developed.  HENT:     Head: Normocephalic and atraumatic.  Eyes:     Conjunctiva/sclera: Conjunctivae normal.  Cardiovascular:     Rate and Rhythm: Normal rate and regular rhythm.     Heart sounds: Normal heart sounds. No murmur heard.   Pulmonary:     Effort: Pulmonary effort is normal. No respiratory distress.     Breath sounds: Decreased breath sounds and wheezing present.  Abdominal:     General: Bowel sounds are normal.     Palpations: Abdomen is soft.     Tenderness: There is no abdominal  tenderness. There is no guarding or rebound.  Musculoskeletal:     Cervical back: Neck supple.     Right lower leg: No tenderness. No edema.     Left lower leg: No tenderness. No edema.  Skin:    General: Skin is warm and dry.  Neurological:     Mental Status: He is alert.     ED Results / Procedures / Treatments   Labs (all labs ordered are listed, but only abnormal results are displayed) Labs Reviewed  BASIC METABOLIC PANEL - Abnormal; Notable for the following components:      Result Value   Sodium 134 (*)    Glucose, Bld 102 (*)    All other components within normal limits  SARS CORONAVIRUS 2 BY RT PCR (HOSPITAL ORDER, [facility])  CBC WITH DIFFERENTIAL/PLATELET  TROPONIN I (HIGH SENSITIVITY)  TROPONIN I (HIGH SENSITIVITY)    EKG EKG Interpretation  Date/Time:  Monday September 05 2019 11:33:04 EDT Ventricular Rate:  94 PR Interval:    QRS Duration: 82 QT Interval:  315 QTC Calculation: 394 R Axis:   97 Text Interpretation: Sinus rhythm Right axis deviation Anteroseptal infarct, old When compared to prior, similar appearance. No STEMI Confirmed by Antony Blackbird [phone]) on 09/05/2019 3:26:01 PM   Radiology DG Chest Portable 1 View  Result Date: 09/05/2019 CLINICAL DATA:  Cough EXAM: PORTABLE CHEST 1 VIEW COMPARISON:  08/29/2019 FINDINGS: The heart size and mediastinal contours are within normal limits. Severe emphysema. The visualized skeletal structures are unremarkable. IMPRESSION: Severe emphysema without acute abnormality of the lungs in AP portable projection. Electronically Signed   By: Eddie Candle M.D.   On: 09/05/2019 13:10    Procedures Procedures (including critical care time)  Medications Ordered in ED Medications  albuterol (VENTOLIN HFA) 108 (90 Base) MCG/ACT inhaler 4 puff (4 puffs Inhalation Given 09/05/19 1509)  AeroChamber Plus Flo-Vu Large MISC 1 each (1 each Other Given 09/05/19 1552)  magnesium sulfate IVPB 2 g 50 mL (0  g Intravenous Stopped 09/05/19 1551)  doxycycline (VIBRA-TABS) tablet 100 mg (100 mg Oral Given 09/05/19 1509)  ipratropium-albuterol (DUONEB) 0.5-2.5 (3) MG/3ML nebulizer solution 3 mL (3 mLs Nebulization Given 09/05/19 1506)  ipratropium-albuterol (DUONEB) 0.5-2.5 (3) MG/3ML nebulizer solution 3 mL (3 mLs Nebulization Given 09/05/19 1620)    ED Course  I have reviewed the triage vital signs and the nursing notes.  Pertinent labs & imaging results that were available during my care of the patient were reviewed by me and considered in my medical decision making (see chart for details).    MDM Rules/Calculators/A&P                          64 year old male with history of COPD on chronic 2 L O2 presenting for evaluation of increased shortness of breath that started yesterday.  Also with increased cough and wheezing.  On arrival patient satting at 99% on home oxygen requirement.  Remainder vital signs reassuring.  Does have decreased breath sounds throughout and wheezing in all lung fields.  Will give albuterol, doxycycline, magnesium.  Will order labs Covid test chest x-ray and EKG.  CBC nonacute BMP nonacute Trop neg x2 COVID neg  EKG with Sinus rhythm Right axis deviation Anteroseptal infarct, old When compared to prior, similar appearance. No STEMI   Chest x-ray with severe emphysema without acute abnormality of the lungs in AP portable projection  Pt given albuterol and several duo nebs.  He was also given magnesium and doxycycline.  He already received steroids prior to arrival.  On my reevaluation he states he feels much improved and is breathing back to baseline.  He is moving more air but still has some wheezing.  He was ambulated in the department on his baseline O2 requirement and maintains sats at 95% and above. Feel he would benefit from treatment with additional course of steroids for COPD exacerbation.  We will also DC with doxycycline.  Recommended close follow-up with PCP and  strict return precautions.  Patient voiced understanding plan reasons to return.  All Questions answered.  Patient before discharge.  Final Clinical Impression(s) / ED Diagnoses Final diagnoses:  COPD exacerbation ([place])    Rx / DC Orders ED Discharge Orders         Ordered    doxycycline (VIBRAMYCIN) 100 MG capsule  2 times daily     Discontinue  Reprint     09/05/19 1640           Rodney Booze, PA-C 09/05/19 1643    Tegeler, Gwenyth Allegra, MD 09/06/19 [phone]

## 2019-09-07 DIAGNOSIS — I10 Essential (primary) hypertension: Secondary | ICD-10-CM | POA: Diagnosis not present

## 2019-09-07 DIAGNOSIS — J439 Emphysema, unspecified: Secondary | ICD-10-CM | POA: Diagnosis not present

## 2019-09-07 DIAGNOSIS — J9611 Chronic respiratory failure with hypoxia: Secondary | ICD-10-CM | POA: Diagnosis not present

## 2019-09-07 DIAGNOSIS — F200 Paranoid schizophrenia: Secondary | ICD-10-CM | POA: Diagnosis not present

## 2019-09-07 DIAGNOSIS — E785 Hyperlipidemia, unspecified: Secondary | ICD-10-CM | POA: Diagnosis not present

## 2019-09-07 DIAGNOSIS — G4700 Insomnia, unspecified: Secondary | ICD-10-CM | POA: Diagnosis not present

## 2019-09-09 DIAGNOSIS — I10 Essential (primary) hypertension: Secondary | ICD-10-CM | POA: Diagnosis not present

## 2019-09-09 DIAGNOSIS — J439 Emphysema, unspecified: Secondary | ICD-10-CM | POA: Diagnosis not present

## 2019-09-09 DIAGNOSIS — Z7951 Long term (current) use of inhaled steroids: Secondary | ICD-10-CM | POA: Diagnosis not present

## 2019-09-09 DIAGNOSIS — Z9981 Dependence on supplemental oxygen: Secondary | ICD-10-CM | POA: Diagnosis not present

## 2019-09-09 DIAGNOSIS — G4700 Insomnia, unspecified: Secondary | ICD-10-CM | POA: Diagnosis not present

## 2019-09-09 DIAGNOSIS — F200 Paranoid schizophrenia: Secondary | ICD-10-CM | POA: Diagnosis not present

## 2019-09-09 DIAGNOSIS — Z9181 History of falling: Secondary | ICD-10-CM | POA: Diagnosis not present

## 2019-09-09 DIAGNOSIS — E785 Hyperlipidemia, unspecified: Secondary | ICD-10-CM | POA: Diagnosis not present

## 2019-09-09 DIAGNOSIS — J9611 Chronic respiratory failure with hypoxia: Secondary | ICD-10-CM | POA: Diagnosis not present

## 2019-09-09 DIAGNOSIS — F17210 Nicotine dependence, cigarettes, uncomplicated: Secondary | ICD-10-CM | POA: Diagnosis not present

## 2019-09-15 DIAGNOSIS — E785 Hyperlipidemia, unspecified: Secondary | ICD-10-CM | POA: Diagnosis not present

## 2019-09-15 DIAGNOSIS — G4700 Insomnia, unspecified: Secondary | ICD-10-CM | POA: Diagnosis not present

## 2019-09-15 DIAGNOSIS — I10 Essential (primary) hypertension: Secondary | ICD-10-CM | POA: Diagnosis not present

## 2019-09-15 DIAGNOSIS — J9611 Chronic respiratory failure with hypoxia: Secondary | ICD-10-CM | POA: Diagnosis not present

## 2019-09-15 DIAGNOSIS — J439 Emphysema, unspecified: Secondary | ICD-10-CM | POA: Diagnosis not present

## 2019-09-15 DIAGNOSIS — F200 Paranoid schizophrenia: Secondary | ICD-10-CM | POA: Diagnosis not present

## 2019-09-22 DIAGNOSIS — I10 Essential (primary) hypertension: Secondary | ICD-10-CM | POA: Diagnosis not present

## 2019-09-22 DIAGNOSIS — F200 Paranoid schizophrenia: Secondary | ICD-10-CM | POA: Diagnosis not present

## 2019-09-22 DIAGNOSIS — J9611 Chronic respiratory failure with hypoxia: Secondary | ICD-10-CM | POA: Diagnosis not present

## 2019-09-22 DIAGNOSIS — J439 Emphysema, unspecified: Secondary | ICD-10-CM | POA: Diagnosis not present

## 2019-09-22 DIAGNOSIS — G4700 Insomnia, unspecified: Secondary | ICD-10-CM | POA: Diagnosis not present

## 2019-09-22 DIAGNOSIS — E785 Hyperlipidemia, unspecified: Secondary | ICD-10-CM | POA: Diagnosis not present

## 2019-09-23 DIAGNOSIS — I10 Essential (primary) hypertension: Secondary | ICD-10-CM | POA: Diagnosis not present

## 2019-09-23 DIAGNOSIS — G4700 Insomnia, unspecified: Secondary | ICD-10-CM | POA: Diagnosis not present

## 2019-09-23 DIAGNOSIS — J9611 Chronic respiratory failure with hypoxia: Secondary | ICD-10-CM | POA: Diagnosis not present

## 2019-09-23 DIAGNOSIS — J439 Emphysema, unspecified: Secondary | ICD-10-CM | POA: Diagnosis not present

## 2019-09-23 DIAGNOSIS — E785 Hyperlipidemia, unspecified: Secondary | ICD-10-CM | POA: Diagnosis not present

## 2019-09-23 DIAGNOSIS — F200 Paranoid schizophrenia: Secondary | ICD-10-CM | POA: Diagnosis not present

## 2019-09-26 ENCOUNTER — Other Ambulatory Visit: Payer: Self-pay | Admitting: *Deleted

## 2019-09-26 NOTE — Patient Outreach (Signed)
[facility]) Care Management  09/26/2019  VERTIS SCHEIB 1955/03/19 [id number]   Outreach attempt #4, successful to sister [name].  Member also present and involved in conversation.  She report he is doing better despite having to go to the ED twice in the month of June for shortness of breath.  He report his is feeling 70 % better, sister state this has been a good week with his breathing.  He has been increasing his exercise, still working with home health PT and nursing.  Denies any urgent concerns at this time, will follow up within the next month.  Goals    .  Patient want to get back to 100% and stay out of the hospital (pt-stated)      [place] (see longtitudinal plan of care for additional care plan information)  Current Barriers:  Marland Kitchen Knowledge deficits related to basic understanding of COPD disease process . Knowledge deficits related to basic COPD self care/management . Literacy barriers   Case Manager Clinical Goal(s):  Over the next 28 days, patient will be able to verbalize understanding of COPD action plan and when to seek appropriate levels of medical care  Over the next 28 days, patient will engage in lite exercise as tolerated to build/regain stamina and strength and reduce shortness of breath through activity tolerance  Over the next 45 days, patient will not be hospitalized for COPD exacerbation   Interventions:   Provided patient with basic written and verbal COPD education on self care/management/and exacerbation prevention   Provided patient with COPD action plan and reinforced importance of daily self assessment  Provided instruction about proper use of medications used for management of COPD including inhalers  Advised patient to engage in light exercise as tolerated 3-5 days a week  Patient Self Care Activities:  . Takes medications as prescribed including inhalers . Self assesses COPD action plan zone and makes appointment with  provider if in the yellow zone for 48 hours without improvement. . Engages in light exercise 3-5 days a week . Utilizes infection prevention strategies to reduce risk of respiratory infection  . Unable to self administer medications as prescribed . Unable to perform ADLs independently . Unable to perform IADLs independently  Initial goal documentation      .  [name], [place], MSN [place] [phone]

## 2019-09-27 DIAGNOSIS — G4700 Insomnia, unspecified: Secondary | ICD-10-CM | POA: Diagnosis not present

## 2019-09-27 DIAGNOSIS — E785 Hyperlipidemia, unspecified: Secondary | ICD-10-CM | POA: Diagnosis not present

## 2019-09-27 DIAGNOSIS — F200 Paranoid schizophrenia: Secondary | ICD-10-CM | POA: Diagnosis not present

## 2019-09-27 DIAGNOSIS — J9611 Chronic respiratory failure with hypoxia: Secondary | ICD-10-CM | POA: Diagnosis not present

## 2019-09-27 DIAGNOSIS — I10 Essential (primary) hypertension: Secondary | ICD-10-CM | POA: Diagnosis not present

## 2019-09-27 DIAGNOSIS — J439 Emphysema, unspecified: Secondary | ICD-10-CM | POA: Diagnosis not present

## 2019-10-01 DIAGNOSIS — G4700 Insomnia, unspecified: Secondary | ICD-10-CM | POA: Diagnosis not present

## 2019-10-01 DIAGNOSIS — E785 Hyperlipidemia, unspecified: Secondary | ICD-10-CM | POA: Diagnosis not present

## 2019-10-01 DIAGNOSIS — J9611 Chronic respiratory failure with hypoxia: Secondary | ICD-10-CM | POA: Diagnosis not present

## 2019-10-01 DIAGNOSIS — I10 Essential (primary) hypertension: Secondary | ICD-10-CM | POA: Diagnosis not present

## 2019-10-01 DIAGNOSIS — F200 Paranoid schizophrenia: Secondary | ICD-10-CM | POA: Diagnosis not present

## 2019-10-01 DIAGNOSIS — J439 Emphysema, unspecified: Secondary | ICD-10-CM | POA: Diagnosis not present

## 2019-10-05 DIAGNOSIS — G4700 Insomnia, unspecified: Secondary | ICD-10-CM | POA: Diagnosis not present

## 2019-10-05 DIAGNOSIS — I10 Essential (primary) hypertension: Secondary | ICD-10-CM | POA: Diagnosis not present

## 2019-10-05 DIAGNOSIS — J9611 Chronic respiratory failure with hypoxia: Secondary | ICD-10-CM | POA: Diagnosis not present

## 2019-10-05 DIAGNOSIS — J439 Emphysema, unspecified: Secondary | ICD-10-CM | POA: Diagnosis not present

## 2019-10-05 DIAGNOSIS — F200 Paranoid schizophrenia: Secondary | ICD-10-CM | POA: Diagnosis not present

## 2019-10-05 DIAGNOSIS — E785 Hyperlipidemia, unspecified: Secondary | ICD-10-CM | POA: Diagnosis not present

## 2019-10-09 DIAGNOSIS — J439 Emphysema, unspecified: Secondary | ICD-10-CM | POA: Diagnosis not present

## 2019-10-09 DIAGNOSIS — I10 Essential (primary) hypertension: Secondary | ICD-10-CM | POA: Diagnosis not present

## 2019-10-09 DIAGNOSIS — F200 Paranoid schizophrenia: Secondary | ICD-10-CM | POA: Diagnosis not present

## 2019-10-09 DIAGNOSIS — E785 Hyperlipidemia, unspecified: Secondary | ICD-10-CM | POA: Diagnosis not present

## 2019-10-09 DIAGNOSIS — Z7951 Long term (current) use of inhaled steroids: Secondary | ICD-10-CM | POA: Diagnosis not present

## 2019-10-09 DIAGNOSIS — F17210 Nicotine dependence, cigarettes, uncomplicated: Secondary | ICD-10-CM | POA: Diagnosis not present

## 2019-10-09 DIAGNOSIS — J9611 Chronic respiratory failure with hypoxia: Secondary | ICD-10-CM | POA: Diagnosis not present

## 2019-10-09 DIAGNOSIS — G4700 Insomnia, unspecified: Secondary | ICD-10-CM | POA: Diagnosis not present

## 2019-10-09 DIAGNOSIS — Z9181 History of falling: Secondary | ICD-10-CM | POA: Diagnosis not present

## 2019-10-09 DIAGNOSIS — Z9981 Dependence on supplemental oxygen: Secondary | ICD-10-CM | POA: Diagnosis not present

## 2019-10-11 DIAGNOSIS — J9611 Chronic respiratory failure with hypoxia: Secondary | ICD-10-CM | POA: Diagnosis not present

## 2019-10-11 DIAGNOSIS — E785 Hyperlipidemia, unspecified: Secondary | ICD-10-CM | POA: Diagnosis not present

## 2019-10-11 DIAGNOSIS — I10 Essential (primary) hypertension: Secondary | ICD-10-CM | POA: Diagnosis not present

## 2019-10-11 DIAGNOSIS — F200 Paranoid schizophrenia: Secondary | ICD-10-CM | POA: Diagnosis not present

## 2019-10-11 DIAGNOSIS — J439 Emphysema, unspecified: Secondary | ICD-10-CM | POA: Diagnosis not present

## 2019-10-11 DIAGNOSIS — G4700 Insomnia, unspecified: Secondary | ICD-10-CM | POA: Diagnosis not present

## 2019-10-13 DIAGNOSIS — I10 Essential (primary) hypertension: Secondary | ICD-10-CM | POA: Diagnosis not present

## 2019-10-13 DIAGNOSIS — J439 Emphysema, unspecified: Secondary | ICD-10-CM | POA: Diagnosis not present

## 2019-10-13 DIAGNOSIS — J9611 Chronic respiratory failure with hypoxia: Secondary | ICD-10-CM | POA: Diagnosis not present

## 2019-10-13 DIAGNOSIS — E785 Hyperlipidemia, unspecified: Secondary | ICD-10-CM | POA: Diagnosis not present

## 2019-10-13 DIAGNOSIS — F200 Paranoid schizophrenia: Secondary | ICD-10-CM | POA: Diagnosis not present

## 2019-10-13 DIAGNOSIS — G4700 Insomnia, unspecified: Secondary | ICD-10-CM | POA: Diagnosis not present

## 2019-10-17 DIAGNOSIS — E785 Hyperlipidemia, unspecified: Secondary | ICD-10-CM | POA: Diagnosis not present

## 2019-10-17 DIAGNOSIS — F200 Paranoid schizophrenia: Secondary | ICD-10-CM | POA: Diagnosis not present

## 2019-10-17 DIAGNOSIS — I10 Essential (primary) hypertension: Secondary | ICD-10-CM | POA: Diagnosis not present

## 2019-10-17 DIAGNOSIS — J9611 Chronic respiratory failure with hypoxia: Secondary | ICD-10-CM | POA: Diagnosis not present

## 2019-10-17 DIAGNOSIS — J439 Emphysema, unspecified: Secondary | ICD-10-CM | POA: Diagnosis not present

## 2019-10-17 DIAGNOSIS — G4700 Insomnia, unspecified: Secondary | ICD-10-CM | POA: Diagnosis not present

## 2019-10-19 DIAGNOSIS — J439 Emphysema, unspecified: Secondary | ICD-10-CM | POA: Diagnosis not present

## 2019-10-19 DIAGNOSIS — J9611 Chronic respiratory failure with hypoxia: Secondary | ICD-10-CM | POA: Diagnosis not present

## 2019-10-19 DIAGNOSIS — I10 Essential (primary) hypertension: Secondary | ICD-10-CM | POA: Diagnosis not present

## 2019-10-19 DIAGNOSIS — E785 Hyperlipidemia, unspecified: Secondary | ICD-10-CM | POA: Diagnosis not present

## 2019-10-19 DIAGNOSIS — F200 Paranoid schizophrenia: Secondary | ICD-10-CM | POA: Diagnosis not present

## 2019-10-19 DIAGNOSIS — G4700 Insomnia, unspecified: Secondary | ICD-10-CM | POA: Diagnosis not present

## 2019-10-22 DIAGNOSIS — Z23 Encounter for immunization: Secondary | ICD-10-CM | POA: Diagnosis not present

## 2019-10-24 DIAGNOSIS — J439 Emphysema, unspecified: Secondary | ICD-10-CM | POA: Diagnosis not present

## 2019-10-24 DIAGNOSIS — G4700 Insomnia, unspecified: Secondary | ICD-10-CM | POA: Diagnosis not present

## 2019-10-24 DIAGNOSIS — E785 Hyperlipidemia, unspecified: Secondary | ICD-10-CM | POA: Diagnosis not present

## 2019-10-24 DIAGNOSIS — I10 Essential (primary) hypertension: Secondary | ICD-10-CM | POA: Diagnosis not present

## 2019-10-24 DIAGNOSIS — J9611 Chronic respiratory failure with hypoxia: Secondary | ICD-10-CM | POA: Diagnosis not present

## 2019-10-24 DIAGNOSIS — F200 Paranoid schizophrenia: Secondary | ICD-10-CM | POA: Diagnosis not present

## 2019-10-25 ENCOUNTER — Other Ambulatory Visit: Payer: Self-pay | Admitting: *Deleted

## 2019-10-25 NOTE — Patient Outreach (Signed)
[name]) Care Management  10/25/2019  ABDOUL ENCINAS 1955-07-08 [id number]   Call placed to member's sister to follow up on management of member's COPD.  State he is doing better, hasn't had any ED visits since June, which is a huge improvement.  He is still working with [facility] for PT and nursing, state nurse was in the home yesterday, report wheezing is minimal.  She continues to make sure he is taking all of his medications, including nebulizer treatments.  He has follow up with PCP on 8/17.    Sister admits that member doesn't always follow his recommended diet but state he's such a picky eater she usually will get whatever he will eat to keep his intake adequate.  She is interested in receiving payment for caring for him, will contact [employer] to discuss process for this.  Advised to contact this care manager with any questions, denies any urgent concerns at this time.  Will follow up within the next month.  Goals Addressed              This Visit's Progress   .  Patient want to get back to 100% and stay out of the hospital (pt-stated)   On track     [place] (see longtitudinal plan of care for additional care plan information)  Current Barriers:  Marland Kitchen Knowledge deficits related to basic understanding of COPD disease process . Knowledge deficits related to basic COPD self care/management . Literacy barriers   Case Manager Clinical Goal(s):  Over the next 28 days, patient will be able to verbalize understanding of COPD action plan and when to seek appropriate levels of medical care  Over the next 28 days, patient will engage in lite exercise as tolerated to build/regain stamina and strength and reduce shortness of breath through activity tolerance  Over the next 45 days, patient will not be hospitalized for COPD exacerbation   Interventions:   Provided patient with basic written and verbal COPD education on self care/management/and  exacerbation prevention   Provided patient with COPD action plan and reinforced importance of daily self assessment  Provided instruction about proper use of medications used for management of COPD including inhalers  Advised patient to engage in light exercise as tolerated 3-5 days a week  Patient Self Care Activities:  . Takes medications as prescribed including inhalers . Self assesses COPD action plan zone and makes appointment with provider if in the yellow zone for 48 hours without improvement. . Engages in light exercise 3-5 days a week . Utilizes infection prevention strategies to reduce risk of respiratory infection  . Unable to self administer medications as prescribed . Unable to perform ADLs independently . Unable to perform IADLs independently  Initial goal documentation       Mcknight David, RN, MSN [place] [phone]

## 2019-10-31 DIAGNOSIS — J439 Emphysema, unspecified: Secondary | ICD-10-CM | POA: Diagnosis not present

## 2019-10-31 DIAGNOSIS — I10 Essential (primary) hypertension: Secondary | ICD-10-CM | POA: Diagnosis not present

## 2019-10-31 DIAGNOSIS — J9611 Chronic respiratory failure with hypoxia: Secondary | ICD-10-CM | POA: Diagnosis not present

## 2019-10-31 DIAGNOSIS — E785 Hyperlipidemia, unspecified: Secondary | ICD-10-CM | POA: Diagnosis not present

## 2019-10-31 DIAGNOSIS — F200 Paranoid schizophrenia: Secondary | ICD-10-CM | POA: Diagnosis not present

## 2019-10-31 DIAGNOSIS — G4700 Insomnia, unspecified: Secondary | ICD-10-CM | POA: Diagnosis not present

## 2019-11-02 ENCOUNTER — Telehealth (INDEPENDENT_AMBULATORY_CARE_PROVIDER_SITE_OTHER): Payer: Medicare Other | Admitting: Psychiatry

## 2019-11-02 ENCOUNTER — Other Ambulatory Visit: Payer: Self-pay

## 2019-11-02 ENCOUNTER — Encounter (HOSPITAL_COMMUNITY): Payer: Self-pay | Admitting: Psychiatry

## 2019-11-02 DIAGNOSIS — F99 Mental disorder, not otherwise specified: Secondary | ICD-10-CM

## 2019-11-02 DIAGNOSIS — F200 Paranoid schizophrenia: Secondary | ICD-10-CM

## 2019-11-02 DIAGNOSIS — F5105 Insomnia due to other mental disorder: Secondary | ICD-10-CM

## 2019-11-02 MED ORDER — MIRTAZAPINE 30 MG PO TABS: 30.0000 mg | ORAL_TABLET | Freq: Every day | ORAL | 0 refills | Status: DC

## 2019-11-02 MED ORDER — RISPERIDONE 3 MG PO TABS: 3.0000 mg | ORAL_TABLET | Freq: Two times a day (BID) | ORAL | 0 refills | Status: DC

## 2019-11-02 MED ORDER — BENZTROPINE MESYLATE 2 MG PO TABS: 2.0000 mg | ORAL_TABLET | Freq: Two times a day (BID) | ORAL | 0 refills | Status: DC

## 2019-11-02 NOTE — Progress Notes (Signed)
Virtual Visit via Telephone Note  I connected with Blake Mcknight on 11/02/19 at  2:20 PM EDT by telephone and verified that I am speaking with the correct person using two identifiers.  Location: Patient: home Provider: home office   I discussed the limitations, risks, security and privacy concerns of performing an evaluation and management service by telephone and the availability of in person appointments. I also discussed with the patient that there may be a patient responsible charge related to this service. The patient expressed understanding and agreed to proceed.   History of Present Illness: Patient is evaluated by phone session.  He is a poor historian and his Sister Blake Mcknight was present with him.  As per sister patient is doing well and there has been no recent agitation, anger, mood swings.  He talked to himself but he is sleeping okay.  In June he was taken to the emergency room because of shortness of breath and he was given medication.  He still have cough but taking inhaler as prescribed.  He takes trazodone once in a while when he cannot sleep.  His energy level is fair.  He does not go outside as much and tends to be by himself he has no tremors for rash.  I talked again to reduce the dose of Risperdal since patient is been stable for a while and taking a higher dose of Risperdal.  After some discussion patient and sister agreed to cut down the dose to try.  Patient denies drinking or using any illegal substances.    Past Psychiatric History:Reviewed. H/O schizophrenia and admitted at [facility] years ago. H/O paranoid, delusional, disorganized behavior and having mood swings.   Recent Results (from the past 2160 hour(s))  Basic metabolic panel     Status: Abnormal   Collection Time: 08/29/19  3:14 PM  Result Value Ref Range   Sodium 134 (L) 135 - 145 mmol/L   Potassium 5.2 (H) 3.5 - 5.1 mmol/L    Comment: HEEL OF FOOT   Chloride 100 98 - 111 mmol/L   CO2 23 22 -  32 mmol/L   Glucose, Bld 109 (H) 70 - 99 mg/dL    Comment: Glucose reference range applies only to samples taken after fasting for at least 8 hours.   BUN <5 (L) 8 - 23 mg/dL   Creatinine, Ser 0.67 0.61 - 1.24 mg/dL   Calcium 8.8 (L) 8.9 - 10.3 mg/dL   GFR calc non Af Amer >60 >60 mL/min   GFR calc Af Amer >60 >60 mL/min   Anion gap 11 5 - 15    Comment: Performed at [facility] [address] [place], [place] [address]  CBC with Differential     Status: None   Collection Time: 08/29/19  3:14 PM  Result Value Ref Range   WBC 7.6 4.0 - 10.5 K/uL   RBC 4.77 4.22 - 5.81 MIL/uL   Hemoglobin 14.4 13.0 - 17.0 g/dL   HCT 43.3 39 - 52 %   MCV 90.8 80.0 - 100.0 fL   MCH 30.2 26.0 - 34.0 pg   MCHC 33.3 30.0 - 36.0 g/dL   RDW 12.0 11.5 - 15.5 %   Platelets 266 150 - 400 K/uL   nRBC 0.0 0.0 - 0.2 %   Neutrophils Relative % 48 %   Neutro Abs 3.6 1.7 - 7.7 K/uL   Lymphocytes Relative 35 %   Lymphs Abs 2.7 0.7 - 4.0 K/uL  Monocytes Relative 9 %   Monocytes Absolute 0.7 0 - 1 K/uL   Eosinophils Relative 7 %   Eosinophils Absolute 0.5 0 - 0 K/uL   Basophils Relative 1 %   Basophils Absolute 0.1 0 - 0 K/uL   Immature Granulocytes 0 %   Abs Immature Granulocytes 0.02 0.00 - 0.07 K/uL    Comment: Performed at [facility], [place] [address] [place], [place] [address]  SARS Coronavirus 2 by RT PCR (hospital order, performed in [facility] hospital lab) Nasopharyngeal Nasopharyngeal Swab     Status: None   Collection Time: 08/29/19  3:33 PM   Specimen: Nasopharyngeal Swab  Result Value Ref Range   SARS Coronavirus 2 NEGATIVE NEGATIVE    Comment: (NOTE) SARS-CoV-2 target nucleic acids are NOT DETECTED.  The SARS-CoV-2 RNA is generally detectable in upper and lower respiratory specimens during the acute phase of infection. The lowest concentration of SARS-CoV-2 viral copies this assay can detect is 250 copies / mL. A negative result does not preclude SARS-CoV-2 infection and  should not be used as the sole basis for treatment or other patient management decisions.  A negative result may occur with improper specimen collection / handling, submission of specimen other than nasopharyngeal swab, presence of viral mutation(s) within the areas targeted by this assay, and inadequate number of viral copies (<250 copies / mL). A negative result must be combined with clinical observations, patient history, and epidemiological information.  Fact Sheet for Patients:   StrictlyIdeas.no  Fact Sheet for Healthcare Providers: BankingDealers.co.za  This test is not yet approved or  cleared by the [place] FDA and has been authorized for detection and/or diagnosis of SARS-CoV-2 by FDA under an Emergency Use Authorization (EUA).  This EUA will remain in effect (meaning this test can be used) for the duration of the COVID-19 declaration under Section 564(b)(1) of the Act, 21 U.S.C. section 360bbb-3(b)(1), unless the authorization is terminated or revoked sooner.  Performed at [facility], [place] [address] [place], [place] [address]   CBC with Differential     Status: None   Collection Time: 09/05/19 12:33 PM  Result Value Ref Range   WBC 9.4 4.0 - 10.5 K/uL   RBC 4.96 4.22 - 5.81 MIL/uL   Hemoglobin 14.9 13.0 - 17.0 g/dL   HCT 45.2 39 - 52 %   MCV 91.1 80.0 - 100.0 fL   MCH 30.0 26.0 - 34.0 pg   MCHC 33.0 30.0 - 36.0 g/dL   RDW 12.3 11.5 - 15.5 %   Platelets 238 150 - 400 K/uL   nRBC 0.0 0.0 - 0.2 %   Neutrophils Relative % 72 %   Neutro Abs 6.8 1.7 - 7.7 K/uL   Lymphocytes Relative 18 %   Lymphs Abs 1.7 0.7 - 4.0 K/uL   Monocytes Relative 6 %   Monocytes Absolute 0.5 0 - 1 K/uL   Eosinophils Relative 3 %   Eosinophils Absolute 0.3 0 - 0 K/uL   Basophils Relative 1 %   Basophils Absolute 0.1 0 - 0 K/uL   Immature Granulocytes 0 %   Abs Immature Granulocytes 0.04 0.00 - 0.07 K/uL    Comment: Performed  at [facility], [address] [place], [place] [address]  Basic metabolic panel     Status: Abnormal   Collection Time: 09/05/19 12:33 PM  Result Value Ref Range   Sodium 134 (L) 135 - 145 mmol/L   Potassium 4.3 3.5 -  5.1 mmol/L   Chloride 99 98 - 111 mmol/L   CO2 25 22 - 32 mmol/L   Glucose, Bld 102 (H) 70 - 99 mg/dL    Comment: Glucose reference range applies only to samples taken after fasting for at least 8 hours.   BUN 10 8 - 23 mg/dL   Creatinine, Ser 0.88 0.61 - 1.24 mg/dL   Calcium 9.1 8.9 - 10.3 mg/dL   GFR calc non Af Amer >60 >60 mL/min   GFR calc Af Amer >60 >60 mL/min   Anion gap 10 5 - 15    Comment: Performed at [facility] [address] [place], [place] [address]  Troponin I (High Sensitivity)     Status: None   Collection Time: 09/05/19 12:33 PM  Result Value Ref Range   Troponin I (High Sensitivity) 3 <18 ng/L    Comment: (NOTE) Elevated high sensitivity troponin I (hsTnI) values and significant  changes across serial measurements may suggest ACS but many other  chronic and acute conditions are known to elevate hsTnI results.  Refer to the "Links" section for chest pain algorithms and additional  guidance. Performed at [facility], [place] [address], [place], [place] [address]   Troponin I (High Sensitivity)     Status: None   Collection Time: 09/05/19  2:25 PM  Result Value Ref Range   Troponin I (High Sensitivity) 3 <18 ng/L    Comment: (NOTE) Elevated high sensitivity troponin I (hsTnI) values and significant  changes across serial measurements may suggest ACS but many other  chronic and acute conditions are known to elevate hsTnI results.  Refer to the "Links" section for chest pain algorithms and additional  guidance. Performed at [facility], [place] [address], [place], [place] [address]   SARS Coronavirus 2 by RT PCR (hospital order, performed in [facility] hospital lab) Nasopharyngeal Nasopharyngeal Swab     Status: None    Collection Time: 09/05/19  2:52 PM   Specimen: Nasopharyngeal Swab  Result Value Ref Range   SARS Coronavirus 2 NEGATIVE NEGATIVE    Comment: (NOTE) SARS-CoV-2 target nucleic acids are NOT DETECTED.  The SARS-CoV-2 RNA is generally detectable in upper and lower respiratory specimens during the acute phase of infection. The lowest concentration of SARS-CoV-2 viral copies this assay can detect is 250 copies / mL. A negative result does not preclude SARS-CoV-2 infection and should not be used as the sole basis for treatment or other patient management decisions.  A negative result may occur with improper specimen collection / handling, submission of specimen other than nasopharyngeal swab, presence of viral mutation(s) within the areas targeted by this assay, and inadequate number of viral copies (<250 copies / mL). A negative result must be combined with clinical observations, patient history, and epidemiological information.  Fact Sheet for Patients:   StrictlyIdeas.no  Fact Sheet for Healthcare Providers: BankingDealers.co.za  This test is not yet approved or  cleared by the [place] FDA and has been authorized for detection and/or diagnosis of SARS-CoV-2 by FDA under an Emergency Use Authorization (EUA).  This EUA will remain in effect (meaning this test can be used) for the duration of the COVID-19 declaration under Section 564(b)(1) of the Act, 21 U.S.C. section 360bbb-3(b)(1), unless the authorization is terminated or revoked sooner.  Performed at [facility], [place] [address], [place], [place] [address]      Psychiatric Specialty Exam: Physical Exam  Review of Systems  Weight  146 lb (66.2 kg).There is no height or weight on file to calculate BMI.  General Appearance: NA  Eye Contact:  NA  Speech:  Slow  Volume:  Decreased  Mood:  Euthymic  Affect:  NA  Thought Process:  Descriptions of Associations:  Circumstantial  Orientation:  Full (Time, Place, and Person)  Thought Content:  Hallucinations: Auditory talk to himself, chronic  Suicidal Thoughts:  No  Homicidal Thoughts:  No  Memory:  Immediate;   Fair Recent;   Fair Remote;   Poor  Judgement:  Fair  Insight:  Shallow  Psychomotor Activity:  NA  Concentration:  Concentration: Fair and Attention Span: Fair  Recall:  [employer] of Knowledge:  Fair  Language:  Fair  Akathisia:  No  Handed:  Right  AIMS (if indicated):     Assets:  Desire for Improvement Housing Social Support  ADL's:  Intact  Cognition:  Impaired,  Mild  Sleep:   ok      Assessment and Plan: Schizophrenia, chronic paranoid type.  Insomnia due to underlying mental disorder.  I reviewed blood work results.  His BUN/creatinine is normal and sugar is 102.  We will reduce Risperdal to 3 mg twice a day as discussed with the patient and his sister who agreed to give a try.  However I recommend if symptoms started to come back then they should call us immediately.  Continue mirtazapine 30 mg at bedtime and Cogentin 2 mg twice a day.  Patient has enough trazodone 50 mg which she takes only as needed and does not need any refill.  Follow-up in 3 months.  Follow Up Instructions:    I discussed the assessment and treatment plan with the patient. The patient was provided an opportunity to ask questions and all were answered. The patient agreed with the plan and demonstrated an understanding of the instructions.   The patient was advised to call back or seek an in-person evaluation if the symptoms worsen or if the condition fails to improve as anticipated.  I provided 20 minutes of non-face-to-face time during this encounter.   Kathlee Nations, MD

## 2019-11-04 DIAGNOSIS — E785 Hyperlipidemia, unspecified: Secondary | ICD-10-CM | POA: Diagnosis not present

## 2019-11-04 DIAGNOSIS — G4700 Insomnia, unspecified: Secondary | ICD-10-CM | POA: Diagnosis not present

## 2019-11-04 DIAGNOSIS — J9611 Chronic respiratory failure with hypoxia: Secondary | ICD-10-CM | POA: Diagnosis not present

## 2019-11-04 DIAGNOSIS — F200 Paranoid schizophrenia: Secondary | ICD-10-CM | POA: Diagnosis not present

## 2019-11-04 DIAGNOSIS — J439 Emphysema, unspecified: Secondary | ICD-10-CM | POA: Diagnosis not present

## 2019-11-04 DIAGNOSIS — I10 Essential (primary) hypertension: Secondary | ICD-10-CM | POA: Diagnosis not present

## 2019-11-08 DIAGNOSIS — Z7951 Long term (current) use of inhaled steroids: Secondary | ICD-10-CM | POA: Diagnosis not present

## 2019-11-08 DIAGNOSIS — F17210 Nicotine dependence, cigarettes, uncomplicated: Secondary | ICD-10-CM | POA: Diagnosis not present

## 2019-11-08 DIAGNOSIS — J9611 Chronic respiratory failure with hypoxia: Secondary | ICD-10-CM | POA: Diagnosis not present

## 2019-11-08 DIAGNOSIS — F200 Paranoid schizophrenia: Secondary | ICD-10-CM | POA: Diagnosis not present

## 2019-11-08 DIAGNOSIS — I10 Essential (primary) hypertension: Secondary | ICD-10-CM | POA: Diagnosis not present

## 2019-11-08 DIAGNOSIS — Z9181 History of falling: Secondary | ICD-10-CM | POA: Diagnosis not present

## 2019-11-08 DIAGNOSIS — G4700 Insomnia, unspecified: Secondary | ICD-10-CM | POA: Diagnosis not present

## 2019-11-08 DIAGNOSIS — E785 Hyperlipidemia, unspecified: Secondary | ICD-10-CM | POA: Diagnosis not present

## 2019-11-08 DIAGNOSIS — J439 Emphysema, unspecified: Secondary | ICD-10-CM | POA: Diagnosis not present

## 2019-11-08 DIAGNOSIS — Z9981 Dependence on supplemental oxygen: Secondary | ICD-10-CM | POA: Diagnosis not present

## 2019-11-11 DIAGNOSIS — E785 Hyperlipidemia, unspecified: Secondary | ICD-10-CM | POA: Diagnosis not present

## 2019-11-11 DIAGNOSIS — G4700 Insomnia, unspecified: Secondary | ICD-10-CM | POA: Diagnosis not present

## 2019-11-11 DIAGNOSIS — J9611 Chronic respiratory failure with hypoxia: Secondary | ICD-10-CM | POA: Diagnosis not present

## 2019-11-11 DIAGNOSIS — J439 Emphysema, unspecified: Secondary | ICD-10-CM | POA: Diagnosis not present

## 2019-11-11 DIAGNOSIS — I10 Essential (primary) hypertension: Secondary | ICD-10-CM | POA: Diagnosis not present

## 2019-11-11 DIAGNOSIS — F200 Paranoid schizophrenia: Secondary | ICD-10-CM | POA: Diagnosis not present

## 2019-11-12 DIAGNOSIS — Z23 Encounter for immunization: Secondary | ICD-10-CM | POA: Diagnosis not present

## 2019-11-16 DIAGNOSIS — G4700 Insomnia, unspecified: Secondary | ICD-10-CM | POA: Diagnosis not present

## 2019-11-16 DIAGNOSIS — I10 Essential (primary) hypertension: Secondary | ICD-10-CM | POA: Diagnosis not present

## 2019-11-16 DIAGNOSIS — J439 Emphysema, unspecified: Secondary | ICD-10-CM | POA: Diagnosis not present

## 2019-11-16 DIAGNOSIS — E785 Hyperlipidemia, unspecified: Secondary | ICD-10-CM | POA: Diagnosis not present

## 2019-11-16 DIAGNOSIS — F200 Paranoid schizophrenia: Secondary | ICD-10-CM | POA: Diagnosis not present

## 2019-11-16 DIAGNOSIS — J9611 Chronic respiratory failure with hypoxia: Secondary | ICD-10-CM | POA: Diagnosis not present

## 2019-11-22 ENCOUNTER — Other Ambulatory Visit: Payer: Self-pay | Admitting: *Deleted

## 2019-11-22 DIAGNOSIS — K2100 Gastro-esophageal reflux disease with esophagitis, without bleeding: Secondary | ICD-10-CM | POA: Diagnosis not present

## 2019-11-22 DIAGNOSIS — E559 Vitamin D deficiency, unspecified: Secondary | ICD-10-CM | POA: Diagnosis not present

## 2019-11-22 DIAGNOSIS — I119 Hypertensive heart disease without heart failure: Secondary | ICD-10-CM | POA: Diagnosis not present

## 2019-11-22 DIAGNOSIS — M2550 Pain in unspecified joint: Secondary | ICD-10-CM | POA: Diagnosis not present

## 2019-11-22 DIAGNOSIS — N401 Enlarged prostate with lower urinary tract symptoms: Secondary | ICD-10-CM | POA: Diagnosis not present

## 2019-11-22 DIAGNOSIS — D751 Secondary polycythemia: Secondary | ICD-10-CM | POA: Diagnosis not present

## 2019-11-22 DIAGNOSIS — M159 Polyosteoarthritis, unspecified: Secondary | ICD-10-CM | POA: Diagnosis not present

## 2019-11-22 DIAGNOSIS — F319 Bipolar disorder, unspecified: Secondary | ICD-10-CM | POA: Diagnosis not present

## 2019-11-22 DIAGNOSIS — M899 Disorder of bone, unspecified: Secondary | ICD-10-CM | POA: Diagnosis not present

## 2019-11-22 DIAGNOSIS — Z79899 Other long term (current) drug therapy: Secondary | ICD-10-CM | POA: Diagnosis not present

## 2019-11-22 DIAGNOSIS — K267 Chronic duodenal ulcer without hemorrhage or perforation: Secondary | ICD-10-CM | POA: Diagnosis not present

## 2019-11-22 DIAGNOSIS — J441 Chronic obstructive pulmonary disease with (acute) exacerbation: Secondary | ICD-10-CM | POA: Diagnosis not present

## 2019-11-22 NOTE — Patient Outreach (Signed)
[place] [facility]) Care Management  11/22/2019  Blake Mcknight [birth year] [id number]   Call placed to sister to follow up on management of COPD. No answer, HIPAA compliant voice message left.  Will follow up within the next 3-4 business days.  [name], [place], MSN [place] [phone]

## 2019-11-23 DIAGNOSIS — F200 Paranoid schizophrenia: Secondary | ICD-10-CM | POA: Diagnosis not present

## 2019-11-23 DIAGNOSIS — J9611 Chronic respiratory failure with hypoxia: Secondary | ICD-10-CM | POA: Diagnosis not present

## 2019-11-23 DIAGNOSIS — E785 Hyperlipidemia, unspecified: Secondary | ICD-10-CM | POA: Diagnosis not present

## 2019-11-23 DIAGNOSIS — I10 Essential (primary) hypertension: Secondary | ICD-10-CM | POA: Diagnosis not present

## 2019-11-23 DIAGNOSIS — J439 Emphysema, unspecified: Secondary | ICD-10-CM | POA: Diagnosis not present

## 2019-11-23 DIAGNOSIS — G4700 Insomnia, unspecified: Secondary | ICD-10-CM | POA: Diagnosis not present

## 2019-11-25 ENCOUNTER — Ambulatory Visit: Payer: Medicare Other | Admitting: *Deleted

## 2019-11-25 ENCOUNTER — Other Ambulatory Visit: Payer: Self-pay | Admitting: *Deleted

## 2019-11-25 NOTE — Patient Outreach (Signed)
[facility]) Care Management  11/25/2019  Blake Mcknight 10-28-1955 [id number]   Outreach attempt #2, successful to sister Blake Mcknight.  She report member has continued to progress well, having the normal intermittent shortness of breath but relieved with rest and medications.  He had PCP visit on 9/8, no issues noted, follow up in the next 2-3 months.  She report he is still having the nurse to come visit the home, monitoring vitals during visits, state all have been stable.  Denies any urgent concerns, expresses gratitude for ongoing involvement and periodic calls.  Encouraged to contact this care manager with questions, will follow up within the next month.  Goals Addressed              This Visit's Progress   .  COMPLETED: Patient want to get back to 100% and stay out of the hospital (pt-stated)        [place] (see longtitudinal plan of care for additional care plan information)  Current Barriers:  Marland Kitchen Knowledge deficits related to basic understanding of COPD disease process . Knowledge deficits related to basic COPD self care/management . Literacy barriers   Case Manager Clinical Goal(s):  Over the next 28 days, patient will be able to verbalize understanding of COPD action plan and when to seek appropriate levels of medical care  Over the next 28 days, patient will engage in lite exercise as tolerated to build/regain stamina and strength and reduce shortness of breath through activity tolerance  Over the next 45 days, patient will not be hospitalized for COPD exacerbation   Interventions:   Provided patient with basic written and verbal COPD education on self care/management/and exacerbation prevention   Provided patient with COPD action plan and reinforced importance of daily self assessment  Provided instruction about proper use of medications used for management of COPD including inhalers  Advised patient to engage in light exercise as tolerated  3-5 days a week  Patient Self Care Activities:  . Takes medications as prescribed including inhalers . Self assesses COPD action plan zone and makes appointment with provider if in the yellow zone for 48 hours without improvement. . Engages in light exercise 3-5 days a week . Utilizes infection prevention strategies to reduce risk of respiratory infection  . Unable to self administer medications as prescribed . Unable to perform ADLs independently . Unable to perform IADLs independently         Blake Mcknight, Therapist, sports, MSN [place] [phone]

## 2019-11-30 DIAGNOSIS — J439 Emphysema, unspecified: Secondary | ICD-10-CM | POA: Diagnosis not present

## 2019-11-30 DIAGNOSIS — J9611 Chronic respiratory failure with hypoxia: Secondary | ICD-10-CM | POA: Diagnosis not present

## 2019-11-30 DIAGNOSIS — E785 Hyperlipidemia, unspecified: Secondary | ICD-10-CM | POA: Diagnosis not present

## 2019-11-30 DIAGNOSIS — I10 Essential (primary) hypertension: Secondary | ICD-10-CM | POA: Diagnosis not present

## 2019-11-30 DIAGNOSIS — G4700 Insomnia, unspecified: Secondary | ICD-10-CM | POA: Diagnosis not present

## 2019-11-30 DIAGNOSIS — F200 Paranoid schizophrenia: Secondary | ICD-10-CM | POA: Diagnosis not present

## 2019-12-05 DIAGNOSIS — E785 Hyperlipidemia, unspecified: Secondary | ICD-10-CM | POA: Diagnosis not present

## 2019-12-05 DIAGNOSIS — G4700 Insomnia, unspecified: Secondary | ICD-10-CM | POA: Diagnosis not present

## 2019-12-05 DIAGNOSIS — J439 Emphysema, unspecified: Secondary | ICD-10-CM | POA: Diagnosis not present

## 2019-12-05 DIAGNOSIS — I10 Essential (primary) hypertension: Secondary | ICD-10-CM | POA: Diagnosis not present

## 2019-12-05 DIAGNOSIS — F200 Paranoid schizophrenia: Secondary | ICD-10-CM | POA: Diagnosis not present

## 2019-12-05 DIAGNOSIS — J9611 Chronic respiratory failure with hypoxia: Secondary | ICD-10-CM | POA: Diagnosis not present

## 2019-12-22 ENCOUNTER — Other Ambulatory Visit: Payer: Self-pay | Admitting: *Deleted

## 2019-12-22 NOTE — Patient Outreach (Signed)
[facility]) Care Management  12/22/2019  Blake Mcknight 1955/07/19 [id number]   Call placed to sister, [name], to follow up on management of member's COPD.  She report member is doing well, no excessive shortness of breath (has usual intermittently with activity).  No follow up appointments scheduled for this month, will see PCP and Behavioral Health next month.  She does inquire about member being eligible for paid personal care services, which she would like to provide.  State she was told that member wasn't eligible due to having "2 insurances" (has Medicaid and Medicare).  They have not made it to the assessment portion of the application process through [facility] due to being told this information.  She does not understand why she was told this as she was previously told Medicaid would pay for benefit.  She agrees to have CSW follow up and verify eligibility.  Denies any urgent concerns, encouraged to contact this care manager with questions.  Will place referral to CSW and follow up within the next month.  Goals Addressed            This Visit's Progress    THN - Make and Keep All Appointments       Follow Up Date 11/30   - ask family or friend for a ride - keep a calendar with appointment dates    Why is this important?   Part of staying healthy is seeing the doctor for follow-up care.  If you forget your appointments, there are some things you can do to stay on track.    Notes:      THN - Track and Manage My Symptoms       Follow Up Date 11/5   - arrange in-home help services - develop a rescue plan - keep follow-up appointments    Why is this important?   Tracking your symptoms and other information about your health helps your doctor plan your care.  Write down the symptoms, the time of day, what you were doing and what medicine you are taking.  You will soon learn how to manage your symptoms.     Notes:       [name], RN, MSN [place] [phone]

## 2019-12-28 ENCOUNTER — Encounter: Payer: Self-pay | Admitting: *Deleted

## 2019-12-28 ENCOUNTER — Other Ambulatory Visit: Payer: Self-pay | Admitting: *Deleted

## 2019-12-28 NOTE — Patient Outreach (Signed)
[facility]) Care Management  12/28/2019  ROOSVELT CHURCHWELL February 26, 1956 [id number]   CSW made an initial attempt to try and contact patient and patient's sister, also primary caregiver, Dijon Cosens today, to perform the phone assessment on patient, as well as assess and assist with social work needs and services, without success.  A HIPAA compliant message was left for patient and Ms. Maricela Bo on voicemail.  CSW is currently awaiting a return call.  CSW will make a second outreach attempt within the next 3-4 business days, if a return call is not received from patient or Ms. Debruyne in the meantime.  CSW will also mail a Patient Unsuccessful Outreach Letter to patient's home, requesting that patient or Ms. Maricela Bo contact [facility] at their earliest convenience, if they are interested in receiving social work services through [place] with Scientist, clinical (histocompatibility and immunogenetics).  Nat Christen, BSW, MSW, LCSW  Licensed Education officer, environmental Health System  Mailing [place] [address], [place], [place] [address] [address] Address[address], [place], [place] [address] Toll Free Main # [phone] Fax # [phone] Cell # [phone]  [email or web address]

## 2019-12-30 ENCOUNTER — Ambulatory Visit: Payer: Self-pay | Admitting: *Deleted

## 2020-01-03 ENCOUNTER — Other Ambulatory Visit: Payer: Self-pay | Admitting: *Deleted

## 2020-01-03 NOTE — Patient Outreach (Signed)
[facility] [facility]) Care Management  01/03/2020  Blake Mcknight June 06, 1955 [id number]   CSW made a second attempt to try and contact patient's sister and primary caregiver, Blake Mcknight today, to perform the initial phone assessment on patient, as well as assess and assist with social work needs and services, without success.  A HIPAA compliant message was left for Blake Mcknight on voicemail, as CSW continues to await a return call.  CSW will make a third outreach attempt within the next 3-4 business days, if a return call is not received from Ms. Swaim in the meantime.    Blake Mcknight, BSW, MSW, LCSW  Licensed Education officer, environmental Health System  Mailing [place] [address] [place], [place] [address] Physical Address[address], [place],  [address] Toll Free Main # [phone] Fax # [phone] Cell # [phone]  [email or web address]

## 2020-01-06 ENCOUNTER — Encounter: Payer: Self-pay | Admitting: *Deleted

## 2020-01-06 ENCOUNTER — Other Ambulatory Visit: Payer: Self-pay | Admitting: *Deleted

## 2020-01-06 NOTE — Patient Outreach (Signed)
Blake Mcknight) Care Management  01/06/2020  Blake Mcknight [birth year] [id number]  CSW received an incoming call from patient's sister, Blake Mcknight today, in response to the two HIPAA compliant messages left on voicemail for Blake Mcknight by CSW on 12/28/2019 and 01/03/2020.  Blake Mcknight admitted that she was looking for clarification regarding patient's Medicaid benefits, wanting to become patient's paid caregiver through [employer].  Blake Mcknight went on to explain that she was told that patient would not qualify for paid (PCS) [place], despite patient having Adult Medicaid coverage, through the [place].  Blake Mcknight further explained that she has not even applied for [facility] services for patient yet, confused as to how this determination could even be made without them having any knowledge of the patient.    CSW explained to Blake Mcknight that it sounds as though she may have been misinformed, encouraging her to go ahead and complete the application for PCS and submit to [employer] for processing.  If patient is approved for services, then he will be responsible for determining which in-home care/respite agency he would like to use.  At that point, Blake Mcknight will have the opportunity to apply for the position, but will more than likely need to apply under the [place], providing her with the appropriate contact information.  Blake Mcknight admitted to already having an application for PCS, not requiring assistance with completion of the application, or submission of the application for processing.  Blake Mcknight was most appreciative of the information and of CSW's willingness to explain the process.  CSW will perform a case closure on patient, as all goals of treatment have been met from social work standpoint and no additional  social work needs have been identified at this time.  CSW will notify patient's RNCM with [place] [facility], [name] of CSW's plans to close patient's case.  CSW will fax an update to patient's Primary Care Physician, Dr. [name] to ensure that he is aware of CSW's involvement with patient's plan of care, as well as send a Physician Case Closure Letter.  CSW was able to confirm that Blake Mcknight has the correct contact information for CSW, encouraging her to contact CSW directly if additional social work needs arise, or if she has more questions about patient's application for PCS.  Blake Mcknight voiced understanding and was agreeable to this plan.   Nat Christen, BSW, MSW, LCSW  Licensed Education officer, environmental Health System  Mailing [place] [address], [place], [place] [address] Physical Address[address] [place], [place] [address] Toll Free Main # [phone] Fax # [phone] Cell # [phone]  [email or web address]

## 2020-01-08 ENCOUNTER — Other Ambulatory Visit (HOSPITAL_COMMUNITY): Payer: Self-pay | Admitting: Psychiatry

## 2020-01-08 DIAGNOSIS — F5105 Insomnia due to other mental disorder: Secondary | ICD-10-CM

## 2020-01-08 DIAGNOSIS — F200 Paranoid schizophrenia: Secondary | ICD-10-CM

## 2020-01-08 DIAGNOSIS — F99 Mental disorder, not otherwise specified: Secondary | ICD-10-CM

## 2020-01-09 ENCOUNTER — Ambulatory Visit: Payer: Self-pay | Admitting: *Deleted

## 2020-01-20 ENCOUNTER — Other Ambulatory Visit: Payer: Self-pay | Admitting: *Deleted

## 2020-01-20 ENCOUNTER — Emergency Department (HOSPITAL_COMMUNITY)
Admission: EM | Admit: 2020-01-20 | Discharge: 2020-01-20 | Disposition: A | Discharge status: Home / Self Care | Payer: Medicare Other | Attending: Emergency Medicine | Admitting: Emergency Medicine

## 2020-01-20 ENCOUNTER — Emergency Department (HOSPITAL_COMMUNITY): Payer: Medicare Other

## 2020-01-20 ENCOUNTER — Encounter (HOSPITAL_COMMUNITY): Payer: Self-pay | Admitting: Emergency Medicine

## 2020-01-20 DIAGNOSIS — R52 Pain, unspecified: Secondary | ICD-10-CM | POA: Diagnosis not present

## 2020-01-20 DIAGNOSIS — K439 Ventral hernia without obstruction or gangrene: Secondary | ICD-10-CM | POA: Insufficient documentation

## 2020-01-20 DIAGNOSIS — Z7951 Long term (current) use of inhaled steroids: Secondary | ICD-10-CM | POA: Insufficient documentation

## 2020-01-20 DIAGNOSIS — J441 Chronic obstructive pulmonary disease with (acute) exacerbation: Secondary | ICD-10-CM | POA: Diagnosis not present

## 2020-01-20 DIAGNOSIS — Z79899 Other long term (current) drug therapy: Secondary | ICD-10-CM | POA: Diagnosis not present

## 2020-01-20 DIAGNOSIS — J439 Emphysema, unspecified: Secondary | ICD-10-CM | POA: Diagnosis not present

## 2020-01-20 DIAGNOSIS — I10 Essential (primary) hypertension: Secondary | ICD-10-CM | POA: Diagnosis not present

## 2020-01-20 DIAGNOSIS — R0682 Tachypnea, not elsewhere classified: Secondary | ICD-10-CM | POA: Diagnosis not present

## 2020-01-20 DIAGNOSIS — R1010 Upper abdominal pain, unspecified: Secondary | ICD-10-CM | POA: Diagnosis not present

## 2020-01-20 DIAGNOSIS — R109 Unspecified abdominal pain: Secondary | ICD-10-CM | POA: Diagnosis not present

## 2020-01-20 DIAGNOSIS — R062 Wheezing: Secondary | ICD-10-CM | POA: Diagnosis not present

## 2020-01-20 DIAGNOSIS — R1084 Generalized abdominal pain: Secondary | ICD-10-CM | POA: Diagnosis not present

## 2020-01-20 DIAGNOSIS — F17210 Nicotine dependence, cigarettes, uncomplicated: Secondary | ICD-10-CM | POA: Insufficient documentation

## 2020-01-20 LAB — URINALYSIS, ROUTINE W REFLEX MICROSCOPIC
Bilirubin Urine: NEGATIVE
Glucose, UA: NEGATIVE mg/dL
Hgb urine dipstick: NEGATIVE
Ketones, ur: NEGATIVE mg/dL
Leukocytes,Ua: NEGATIVE
Nitrite: NEGATIVE
Protein, ur: NEGATIVE mg/dL
Specific Gravity, Urine: 1.006 (ref 1.005–1.030)
pH: 6 (ref 5.0–8.0)

## 2020-01-20 LAB — COMPREHENSIVE METABOLIC PANEL
ALT: 29 U/L (ref 0–44)
AST: 23 U/L (ref 15–41)
Albumin: 3.7 g/dL (ref 3.5–5.0)
Alkaline Phosphatase: 49 U/L (ref 38–126)
Anion gap: 10 (ref 5–15)
BUN: <5 mg/dL — ABNORMAL LOW (ref 8–23)
CO2: 24 mmol/L (ref 22–32)
Calcium: 9.4 mg/dL (ref 8.9–10.3)
Chloride: 98 mmol/L (ref 98–111)
Creatinine, Ser: 0.7 mg/dL (ref 0.61–1.24)
GFR, Estimated: >60 mL/min (ref >60)
Glucose, Bld: 112 mg/dL — ABNORMAL HIGH (ref 70–99)
Potassium: 4.3 mmol/L (ref 3.5–5.1)
Sodium: 132 mmol/L — ABNORMAL LOW (ref 135–145)
Total Bilirubin: 0.7 mg/dL (ref 0.3–1.2)
Total Protein: 7 g/dL (ref 6.5–8.1)

## 2020-01-20 LAB — CBC
HCT: 40.8 % (ref 39.0–52.0)
Hemoglobin: 13.4 g/dL (ref 13.0–17.0)
MCH: 29.7 pg (ref 26.0–34.0)
MCHC: 32.8 g/dL (ref 30.0–36.0)
MCV: 90.5 fL (ref 80.0–100.0)
Platelets: 266 10*3/uL (ref 150–400)
RBC: 4.51 MIL/uL (ref 4.22–5.81)
RDW: 12.2 % (ref 11.5–15.5)
WBC: 9.9 10*3/uL (ref 4.0–10.5)
nRBC: 0 % (ref 0.0–0.2)

## 2020-01-20 LAB — LIPASE, BLOOD: Lipase: 27 U/L (ref 11–51)

## 2020-01-20 MED ORDER — ALBUTEROL SULFATE HFA 108 (90 BASE) MCG/ACT IN AERS
2.0000 | INHALATION_SPRAY | Freq: Once | RESPIRATORY_TRACT | Status: AC
  Administered 2020-01-20: 2 via RESPIRATORY_TRACT
  Filled 2020-01-20: qty 6.7

## 2020-01-20 MED ORDER — ALBUTEROL SULFATE HFA 108 (90 BASE) MCG/ACT IN AERS
4.0000 | INHALATION_SPRAY | Freq: Once | RESPIRATORY_TRACT | Status: AC
  Administered 2020-01-20: 4 via RESPIRATORY_TRACT

## 2020-01-20 MED ORDER — ACETAMINOPHEN 325 MG PO TABS
650.0000 mg | ORAL_TABLET | Freq: Once | ORAL | Status: AC
  Administered 2020-01-20: 650 mg via ORAL
  Filled 2020-01-20: qty 2

## 2020-01-20 MED ORDER — IOHEXOL 300 MG/ML  SOLN
80.0000 mL | Freq: Once | INTRAMUSCULAR | Status: AC | PRN
  Administered 2020-01-20: 80 mL via INTRAVENOUS

## 2020-01-20 NOTE — Patient Outreach (Signed)
[facility] [facility]) Care Management  01/20/2020  HARSHA YUSKO [birth year] [id number]   Member scheduled for outreach today, noted he is currently in the ED, complaints of abdominal pain.  Will reschedule for next week.  [name], [place], MSN [place] [phone]

## 2020-01-20 NOTE — ED Provider Notes (Signed)
Blake Mcknight Provider Note   CSN: [id number] Arrival date & time: 01/20/20  1139     History Chief Complaint  Patient presents with  . Abdominal Pain    Blake Mcknight is a 64 y.o. male.  HPI 64 year old male presents with abdominal pain. He tells me he has had abdominal pain for years. Typically occurs around 1 time per week but now it is hurting for 2 weeks straight.  He was constipated for a couple days but had a bowel movement yesterday.  Otherwise no diarrhea.  No vomiting.  Sometimes eating will make the pain occur or worsen.  No fevers.  He also endorses feeling short of breath without a significant cough.  Feels like his COPD.  Started this morning.  No chest pain.   Past Medical History:  Diagnosis Date  . COPD (chronic obstructive pulmonary disease) (HCC)    with bullous emphysema.   Marland Kitchen Heavy cigarette smoker before 2003   pt claims only 10 cigs per day, never heavier amounts.   Marland Kitchen HLD (hyperlipidemia)   . Hypertension   . Schizophrenia ([place]) 06/28/2013   This is a chronic condition and he lives with family    Patient Active Problem List   Diagnosis Date Noted  . Hypoxia   . COPD exacerbation ([place]) 06/05/2019  . Do not resuscitate 02/18/2019  . Bullous emphysema ([place]) 07/26/2013  . Schizophrenia ([place]) 06/28/2013  . Tobacco use disorder 06/06/2013  . Abdominal pain 06/03/2013  . HTN (hypertension) 06/03/2013  . HLD (hyperlipidemia) 06/03/2013  . Chronic obstructive pulmonary disease with acute exacerbation ([place]) 06/03/2013  . Calculus of bile duct without mention of cholecystitis or obstruction 06/03/2013    Past Surgical History:  Procedure Laterality Date  . CHOLECYSTECTOMY N/A 06/06/2013   Procedure: LAPAROSCOPIC CHOLECYSTECTOMY WITH ATTEMPTED INTRAOPERATIVE CHOLANGIOGRAM;  Surgeon: Zenovia Jarred, MD;  Location: [facility];  Service: General;  Laterality: N/A;  . ERCP N/A 06/03/2013   Procedure: ENDOSCOPIC  RETROGRADE CHOLANGIOPANCREATOGRAPHY (ERCP);  Surgeon: Ladene Artist, MD;  Location: [facility] ENDOSCOPY;  Service: Endoscopy;  Laterality: N/A;  . NO PAST SURGERIES         Family History  Problem Relation Age of Onset  . Diabetes Mellitus II Mother   . Diabetes Mother   . Heart disease Father   . Stroke Maternal Aunt   . CAD Neg Hx     Social History   Tobacco Use  . Smoking status: Current Every Day Smoker    Packs/day: 1.00    Years: 41.00    Pack years: 41.00    Types: Cigarettes  . Smokeless tobacco: Never Used  . Tobacco comment: Gets 4 puffs a day.   Vaping Use  . Vaping Use: Never used  Substance Use Topics  . Alcohol use: No  . Drug use: No    Home Medications Prior to Admission medications   Medication Sig Start Date End Date Taking? Authorizing Provider  benztropine (COGENTIN) 2 MG tablet Take 1 tablet (2 mg total) by mouth 2 (two) times daily. 11/02/19   Arfeen, Arlyce Harman, MD  mirtazapine (REMERON) 30 MG tablet Take 1 tablet (30 mg total) by mouth at bedtime. 11/02/19 11/01/20  Arfeen, Arlyce Harman, MD  risperidone (RISPERDAL) 3 MG tablet Take 1 tablet (3 mg total) by mouth 2 (two) times daily. 11/02/19   Arfeen, Arlyce Harman, MD  albuterol (PROVENTIL) (2.5 MG/3ML) 0.083% nebulizer solution Take 3 mLs (2.5 mg total) by nebulization every  3 (three) hours as needed for wheezing or shortness of breath. 02/20/19   Aline August, MD  albuterol (VENTOLIN HFA) 108 (90 Base) MCG/ACT inhaler Inhale 2 puffs into the lungs every 4 (four) hours as needed for wheezing or shortness of breath. 02/20/19   Aline August, MD  Ascorbic Acid (VITAMIN C PO) Take 1 tablet by mouth 3 (three) times a week.     [provider]  atorvastatin (LIPITOR) 10 MG tablet Take 10 mg by mouth daily at 6 PM.  09/21/15   [provider]  azithromycin (ZITHROMAX Z-PAK) 250 MG tablet Take 1 tablet (250 mg total) by mouth daily. 500mg  PO day 1, then 250mg  PO days 205 08/29/19   Little, Wenda Overland, MD    carvedilol (COREG) 3.125 MG tablet Take 1 tablet (3.125 mg total) by mouth 2 (two) times daily with a meal. Patient taking differently: Take 3.125 mg by mouth every morning.  02/20/19   Aline August, MD  cetirizine (ZYRTEC) 10 MG tablet Take 10 mg by mouth daily as needed for allergies or rhinitis.    [provider]  cholecalciferol (VITAMIN D) 25 MCG (1000 UNIT) tablet Take 4,000 Units by mouth every evening.     [provider]  Cyanocobalamin (B-12 PO) Take 1 tablet by mouth every other day.     [provider]  enalapril (VASOTEC) 10 MG tablet Take 10 mg by mouth daily. 06/04/16   [provider]  famotidine (PEPCID) 40 MG tablet Take 40 mg by mouth 2 (two) times daily.  12/28/18   [provider]  Fluticasone-Salmeterol (ADVAIR DISKUS) 250-50 MCG/DOSE AEPB Inhale 1 puff into the lungs 2 (two) times daily. 06/23/19 09/21/19  Harvie Heck, MD  guaiFENesin-dextromethorphan (ROBITUSSIN DM) 100-10 MG/5ML syrup Take 10 mLs by mouth every 4 (four) hours as needed for cough. 02/20/19   Aline August, MD  montelukast (SINGULAIR) 10 MG tablet Take 1 tablet (10 mg total) by mouth daily. 06/23/19 09/21/19  Harvie Heck, MD  multivitamin (ONE-A-DAY MEN'S) TABS tablet Take 1 tablet by mouth daily.    [provider]  nicotine (NICODERM CQ - DOSED IN MG/24 HOURS) 14 mg/24hr patch Place 1 patch (14 mg total) onto the skin daily. 02/20/19   Aline August, MD  omeprazole (PRILOSEC) 20 MG capsule Take 1 capsule (20 mg total) by mouth daily. Patient not taking: Reported on 06/20/2019 03/14/19   Domenic Moras, PA-C  OXYGEN Inhale 2 L/min into the lungs continuous.     [provider]  polyethylene glycol (MIRALAX / GLYCOLAX) 17 g packet Take 17 g by mouth daily as needed for mild constipation.    [provider]  sodium chloride (OCEAN) 0.65 % SOLN nasal spray Place 1 spray into both nostrils as needed for congestion.    [provider]   SPIRULINA PO Take 1 capsule by mouth daily.    [provider]  traZODone (DESYREL) 50 MG tablet Take 1 tablet (50 mg total) by mouth at bedtime as needed for sleep. 05/09/19   Arfeen, Arlyce Harman, MD    Allergies    Patient has no known allergies.  Review of Systems   Review of Systems  Constitutional: Negative for fever.  Respiratory: Positive for shortness of breath. Negative for cough.   Cardiovascular: Negative for chest pain.  Gastrointestinal: Positive for abdominal pain and constipation. Negative for vomiting.  All other systems reviewed and are negative.   Physical Exam Updated Vital Signs BP 135/89   Pulse  85   Temp 99 F (37.2 C) (Oral)   Resp 17   Ht 5\' 6"  (1.676 m)   Wt 72.6 kg   SpO2 99%   BMI 25.82 kg/m   Physical Exam Vitals and nursing note reviewed.  Constitutional:      General: He is not in acute distress.    Appearance: He is well-developed. He is not ill-appearing or diaphoretic.  HENT:     Head: Normocephalic and atraumatic.     Right Ear: External ear normal.     Left Ear: External ear normal.     Nose: Nose normal.  Eyes:     General:        Right eye: No discharge.        Left eye: No discharge.  Cardiovascular:     Rate and Rhythm: Normal rate and regular rhythm.     Heart sounds: Normal heart sounds.  Pulmonary:     Effort: Pulmonary effort is normal. Tachypnea (mild) present. No respiratory distress.     Breath sounds: Wheezing (mild) present.  Abdominal:     Palpations: Abdomen is soft.     Tenderness: There is no abdominal tenderness.     Hernia: A hernia (no incarceration) is present. Hernia is present in the ventral area.  Musculoskeletal:     Cervical back: Neck supple.  Skin:    General: Skin is warm and dry.  Neurological:     Mental Status: He is alert.  Psychiatric:        Mood and Affect: Mood is not anxious.     ED Results / Procedures / Treatments   Labs (all labs ordered are listed, but only abnormal  results are displayed) Labs Reviewed  COMPREHENSIVE METABOLIC PANEL - Abnormal; Notable for the following components:      Result Value   Sodium 132 (*)    Glucose, Bld 112 (*)    BUN <5 (*)    All other components within normal limits  LIPASE, BLOOD  CBC  URINALYSIS, ROUTINE W REFLEX MICROSCOPIC    EKG None  Radiology CT ABDOMEN PELVIS W CONTRAST  Result Date: 01/20/2020 CLINICAL DATA:  Generalized abdominal pain and distension EXAM: CT ABDOMEN AND PELVIS WITH CONTRAST TECHNIQUE: Multidetector CT imaging of the abdomen and pelvis was performed using the standard protocol following bolus administration of intravenous contrast. CONTRAST:  42mL OMNIPAQUE 350 COMPARISON:  03/14/2019 FINDINGS: Lower chest: Severe emphysematous changes are noted in the lungs but stable from the prior exam. Hepatobiliary: Fatty infiltration of the liver is noted. The gallbladder has been surgically removed. Pancreas: Unremarkable. No pancreatic ductal dilatation or surrounding inflammatory changes. Spleen: Normal in size without focal abnormality. Adrenals/Urinary Tract: Adrenal glands are within normal limits. Kidneys demonstrate a normal enhancement pattern bilaterally. Cysts are noted within the left kidney stable in appearance from the prior exam. Normal excretion is noted on delayed images. The bladder is partially distended. Stomach/Bowel: Scattered diverticular change of the colon is noted. No evidence of diverticulitis is seen. No obstructive changes are seen. The appendix is within normal limits. No small bowel or gastric abnormality is seen. Vascular/Lymphatic: Aortic atherosclerosis. No enlarged abdominal or pelvic lymph nodes. Reproductive: Prostate is unremarkable. Other: No abdominal wall hernia or abnormality. No abdominopelvic ascites. Musculoskeletal: Degenerative changes of lumbar spine are noted. IMPRESSION: Diverticulosis without diverticulitis. Fatty liver. Severe emphysematous changes in the lungs.  Cystic changes in the left kidney stable from the prior study. Electronically Signed   By: Linus Mako.D.  On: 01/20/2020 19:36   DG Chest Portable 1 View  Result Date: 01/20/2020 CLINICAL DATA:  Wheezing EXAM: PORTABLE CHEST 1 VIEW COMPARISON:  09/05/2019 FINDINGS: Cardiac shadow is within normal limits. Aortic calcifications are again seen. Stable emphysematous changes are noted bilaterally. No focal infiltrate or effusion is seen. No bony abnormality is noted. IMPRESSION: Chronic changes of emphysema.  No acute abnormality noted. Electronically Signed   By: Inez Catalina M.D.   On: 01/20/2020 17:10    Procedures Procedures (including critical care time)  Medications Ordered in ED Medications  acetaminophen (TYLENOL) tablet 650 mg (650 mg Oral Given 01/20/20 1636)  albuterol (VENTOLIN HFA) 108 (90 Base) MCG/ACT inhaler 2 puff (2 puffs Inhalation Given 01/20/20 1636)  iohexol (OMNIPAQUE) 300 MG/ML solution 80 mL (80 mLs Intravenous Contrast Given 01/20/20 1928)    ED Course  I have reviewed the triage vital signs and the nursing notes.  Pertinent labs & imaging results that were available during my care of the patient were reviewed by me and considered in my medical decision making (see chart for details).    MDM Rules/Calculators/A&P                          No clear explanation for the patient's abdominal pain.  I discussed with wife at bedside.  CT image findings reviewed and I have reviewed these images.  On my view there does appear to be a thinning of the abdominal wall and possibly a hernia which would correlate to the physical exam findings.  However either way there is no incarcerated hernia.  Could be gastric in nature given there is some relation to food so I have recommended he start a PPI, she has omeprazole at home and will start that.  He also needs better bowel regimen.  Otherwise, will refer to [facility] gastroenterology as he has seen them before.  Discharged home with return  precautions. Final Clinical Impression(s) / ED Diagnoses Final diagnoses:  Upper abdominal pain    Rx / DC Orders ED Discharge Orders    None       Sherwood Gambler, MD 01/20/20 2021

## 2020-01-20 NOTE — Discharge Instructions (Signed)
If you develop worsening, continued, or recurrent abdominal pain, uncontrolled vomiting, fever, chest or back pain, or any other new/concerning symptoms then return to the ER for evaluation.  

## 2020-01-20 NOTE — ED Triage Notes (Signed)
Pt arrives via [facility] from home. C/o generalized abd pain with some distention, reports BM yesterday but had been constipated for 3-4 days prior to that. Wears 2L O2 at baseline for COPD. A/ox4. EMS VSS; 154/89, HR 90, RR 18, O2 sat 100 on 2L, cbg 99, temp 97.7.

## 2020-01-23 ENCOUNTER — Other Ambulatory Visit: Payer: Self-pay | Admitting: *Deleted

## 2020-01-23 NOTE — Patient Outreach (Signed)
[facility]) Care Management  01/23/2020  Blake Mcknight 05/14/1955 [id number]   Call placed to member's caregiver/sister to follow up on ED visit last week, no answer.  HIPAA compliant voice message left, will follow up within the next 3-4 business days.  [name], [place], MSN [place] [phone]

## 2020-01-26 ENCOUNTER — Encounter: Payer: Self-pay | Admitting: *Deleted

## 2020-01-26 ENCOUNTER — Other Ambulatory Visit: Payer: Self-pay | Admitting: *Deleted

## 2020-01-26 NOTE — Patient Outreach (Addendum)
[facility]) Care Management  01/26/2020  Blake Mcknight Sep 08, 1955 [id number]   Outreach attempt #2, unsuccessful, HIPAA compliant voice message left.  Will send unsuccessful outreach letter and follow up within the next 3-4 business days.   Update:  Incoming call received back from member's sister.  State he has been managing his COPD well but was seen in the ED last week for abdominal pain.  Relief was experienced once he had a bowel movement.  She has been giving him Miralax but not confident it has been the full dose.  Reviewed dose instructions with sister, state she also encourages him to eat more fiber and water.  Denies any urgent concerns, will schedule follow up with PCP for the next couple weeks.  Agrees to follow up within the next month.  Goals Addressed            This Visit's Progress    [facility] - Make and Keep All Appointments   On track    Follow Up Date 11/30   - ask family or friend for a ride - keep a calendar with appointment dates    Why is this important?   Part of staying healthy is seeing the doctor for follow-up care.  If you forget your appointments, there are some things you can do to stay on track.    Notes:   11/11 - Sister will call to schedule PCP appointment     COMPLETED: [facility] - Track and Manage My Symptoms       Follow Up Date 11/5   - arrange in-home help services - develop a rescue plan - keep follow-up appointments    Why is this important?   Tracking your symptoms and other information about your health helps your doctor plan your care.  Write down the symptoms, the time of day, what you were doing and what medicine you are taking.  You will soon learn how to manage your symptoms.     Notes:        Blake David, RN, MSN [place] [phone]

## 2020-01-26 NOTE — Telephone Encounter (Signed)
This encounter was created in error - please disregard.

## 2020-01-31 ENCOUNTER — Ambulatory Visit: Payer: Self-pay | Admitting: *Deleted

## 2020-02-02 ENCOUNTER — Other Ambulatory Visit: Payer: Self-pay

## 2020-02-02 ENCOUNTER — Telehealth (INDEPENDENT_AMBULATORY_CARE_PROVIDER_SITE_OTHER): Payer: Medicare Other | Admitting: Psychiatry

## 2020-02-02 ENCOUNTER — Encounter (HOSPITAL_COMMUNITY): Payer: Self-pay | Admitting: Psychiatry

## 2020-02-02 DIAGNOSIS — F99 Mental disorder, not otherwise specified: Secondary | ICD-10-CM | POA: Diagnosis not present

## 2020-02-02 DIAGNOSIS — F200 Paranoid schizophrenia: Secondary | ICD-10-CM | POA: Diagnosis not present

## 2020-02-02 DIAGNOSIS — F5105 Insomnia due to other mental disorder: Secondary | ICD-10-CM

## 2020-02-02 MED ORDER — RISPERIDONE 3 MG PO TABS: 3.0000 mg | ORAL_TABLET | Freq: Two times a day (BID) | ORAL | 0 refills | Status: DC

## 2020-02-02 MED ORDER — MIRTAZAPINE 30 MG PO TABS: 30.0000 mg | ORAL_TABLET | Freq: Every day | ORAL | 0 refills | Status: DC

## 2020-02-02 MED ORDER — BENZTROPINE MESYLATE 2 MG PO TABS: 2.0000 mg | ORAL_TABLET | Freq: Two times a day (BID) | ORAL | 0 refills | Status: DC

## 2020-02-02 NOTE — Progress Notes (Signed)
Virtual Visit via Telephone Note  I connected with Blake Mcknight on 02/02/20 at  2:20 PM EST by telephone and verified that I am speaking with the correct person using two identifiers.  Location: Patient: Home Provider: Home Office   I discussed the limitations, risks, security and privacy concerns of performing an evaluation and management service by telephone and the availability of in person appointments. I also discussed with the patient that there may be a patient responsible charge related to this service. The patient expressed understanding and agreed to proceed.   History of Present Illness: Patient is evaluated by phone session.  His sister Blake Mcknight was present with him.  Patient is a poor historian and most of the information was obtained from her sister.  Patient was recently seen in the emergency room because of abdominal pain.  However he was told that he has just constipation.  He is scheduled to see his PCP Dr. Katherine Roan in few days.  Overall sister reported that he is doing okay.  We have cut down the Risperdal and the last visit and so far there has been no worsening of symptoms.  Occasionally he does talk to himself but sister denies any agitation, anger, psychosis or any irritability.  His appetite is okay.  He has tremors but they are under control with the Cogentin.  Patient sister noticed lately he has difficulty doing his ADLs and sometimes he need assistance with bathing and changing his clothes.  Patient denies any suicidal thoughts.  As mentioned above he is a poor historian but reported that he is taking medication and so far he has no issues.   Past Psychiatric History:Reviewed. H/Oschizophrenia and admitted at [facility] years ago.H/Oparanoid, delusional, disorganized behavior and having mood swings.  Recent Results (from the past 2160 hour(s))  Lipase, blood     Status: None   Collection Time: 01/20/20 11:44 AM  Result Value Ref Range   Lipase 27 11 -  51 U/L    Comment: Performed at [facility] [address], [place], [place] [address]  Comprehensive metabolic panel     Status: Abnormal   Collection Time: 01/20/20 11:44 AM  Result Value Ref Range   Sodium 132 (L) 135 - 145 mmol/L   Potassium 4.3 3.5 - 5.1 mmol/L   Chloride 98 98 - 111 mmol/L   CO2 24 22 - 32 mmol/L   Glucose, Bld 112 (H) 70 - 99 mg/dL    Comment: Glucose reference range applies only to samples taken after fasting for at least 8 hours.   BUN <5 (L) 8 - 23 mg/dL   Creatinine, Ser 0.70 0.61 - 1.24 mg/dL   Calcium 9.4 8.9 - 10.3 mg/dL   Total Protein 7.0 6.5 - 8.1 g/dL   Albumin 3.7 3.5 - 5.0 g/dL   AST 23 15 - 41 U/L   ALT 29 0 - 44 U/L   Alkaline Phosphatase 49 38 - 126 U/L   Total Bilirubin 0.7 0.3 - 1.2 mg/dL   GFR, Estimated >60 >60 mL/min    Comment: (NOTE) Calculated using the CKD-EPI Creatinine Equation (2021)    Anion gap 10 5 - 15    Comment: Performed at [facility] [address] [place] [address]  CBC     Status: None   Collection Time: 01/20/20 11:44 AM  Result Value Ref Range   WBC 9.9 4.0 - 10.5 K/uL   RBC 4.51 4.22 - 5.81 MIL/uL  Hemoglobin 13.4 13.0 - 17.0 g/dL   HCT 40.8 39 - 52 %   MCV 90.5 80.0 - 100.0 fL   MCH 29.7 26.0 - 34.0 pg   MCHC 32.8 30.0 - 36.0 g/dL   RDW 12.2 11.5 - 15.5 %   Platelets 266 150 - 400 K/uL   nRBC 0.0 0.0 - 0.2 %    Comment: Performed at [facility], [place] [address], [place], [place] [address]  Urinalysis, Routine w reflex microscopic     Status: None   Collection Time: 01/20/20 12:55 PM  Result Value Ref Range   Color, Urine YELLOW YELLOW   APPearance CLEAR CLEAR   Specific Gravity, Urine 1.006 1.005 - 1.030   pH 6.0 5.0 - 8.0   Glucose, UA NEGATIVE NEGATIVE mg/dL   Hgb urine dipstick NEGATIVE NEGATIVE   Bilirubin Urine NEGATIVE NEGATIVE   Ketones, ur NEGATIVE NEGATIVE mg/dL   Protein, ur NEGATIVE NEGATIVE mg/dL   Nitrite NEGATIVE NEGATIVE   Leukocytes,Ua NEGATIVE  NEGATIVE    Comment: Performed at [facility] [address] [place], [place] [address]    Psychiatric Specialty Exam: Physical Exam  Review of Systems  Weight 160 lb (72.6 kg).There is no height or weight on file to calculate BMI.  General Appearance: NA  Eye Contact:  NA  Speech:  Slow  Volume:  Decreased  Mood:  Euthymic  Affect:  NA  Thought Process:  Descriptions of Associations: Circumstantial  Orientation:  Full (Time, Place, and Person)  Thought Content:  Hallucinations: Auditory  Suicidal Thoughts:  No  Homicidal Thoughts:  No  Memory:  Immediate;   Fair Recent;   Fair Remote;   Poor  Judgement:  Fair  Insight:  Shallow  Psychomotor Activity:  NA  Concentration:  Concentration: Fair and Attention Span: Fair  Recall:  [employer] of Knowledge:  Fair  Language:  Fair  Akathisia:  No  Handed:  Right  AIMS (if indicated):     Assets:  Desire for Improvement Housing Social Support  ADL's:  Impaired  Cognition:  Impaired,  Mild  Sleep:   ok      Assessment and Plan: Schizophrenia chronic paranoid type.  Insomnia due to underlying mental disorder.  Patient psychiatrically stable.  He did tolerate lower dose of Risperdal but patient and his sister is reluctant to cut down further dose.  He agreed to look into that option in the future but not now.  I talked to his sister to discuss to get home health aide to help his ADLs since he has upcoming appointment with PCP.  She agreed to discuss with his PCP.  She does not want to change the medication.  Patient has no side effects from the medication.  I will continue mirtazapine 30 mg at bedtime, Cogentin 2 mg twice a day, Risperdal 3 mg twice a day.  He does not need trazodone at this time however sister notified the future if needed.  Recommended to call us back if there is any question or any concern.  Follow-up in 3 months.  Follow Up Instructions:    I discussed the assessment and treatment plan with the  patient. The patient was provided an opportunity to ask questions and all were answered. The patient agreed with the plan and demonstrated an understanding of the instructions.   The patient was advised to call back or seek an in-person evaluation if the symptoms worsen or if the condition fails to improve as  anticipated.  I provided 16 minutes of non-face-to-face time during this encounter.   Kathlee Nations, MD

## 2020-02-06 DIAGNOSIS — E7800 Pure hypercholesterolemia, unspecified: Secondary | ICD-10-CM | POA: Diagnosis not present

## 2020-02-06 DIAGNOSIS — M2550 Pain in unspecified joint: Secondary | ICD-10-CM | POA: Diagnosis not present

## 2020-02-06 DIAGNOSIS — I119 Hypertensive heart disease without heart failure: Secondary | ICD-10-CM | POA: Diagnosis not present

## 2020-02-06 DIAGNOSIS — F319 Bipolar disorder, unspecified: Secondary | ICD-10-CM | POA: Diagnosis not present

## 2020-02-06 DIAGNOSIS — Z79899 Other long term (current) drug therapy: Secondary | ICD-10-CM | POA: Diagnosis not present

## 2020-02-06 DIAGNOSIS — K267 Chronic duodenal ulcer without hemorrhage or perforation: Secondary | ICD-10-CM | POA: Diagnosis not present

## 2020-02-06 DIAGNOSIS — K2100 Gastro-esophageal reflux disease with esophagitis, without bleeding: Secondary | ICD-10-CM | POA: Diagnosis not present

## 2020-02-06 DIAGNOSIS — N401 Enlarged prostate with lower urinary tract symptoms: Secondary | ICD-10-CM | POA: Diagnosis not present

## 2020-02-06 DIAGNOSIS — M159 Polyosteoarthritis, unspecified: Secondary | ICD-10-CM | POA: Diagnosis not present

## 2020-02-06 DIAGNOSIS — J441 Chronic obstructive pulmonary disease with (acute) exacerbation: Secondary | ICD-10-CM | POA: Diagnosis not present

## 2020-02-06 DIAGNOSIS — E569 Vitamin deficiency, unspecified: Secondary | ICD-10-CM | POA: Diagnosis not present

## 2020-02-23 ENCOUNTER — Other Ambulatory Visit: Payer: Self-pay | Admitting: *Deleted

## 2020-02-23 NOTE — Patient Outreach (Signed)
[facility]) Care Management  02/23/2020  Blake Mcknight 1955-06-11 [id number]   Call placed to member's sister, no answer, HIPAA compliant voice message left.  Will follow up within the next 3-4 business days.  [name], [place], MSN [place] [phone]

## 2020-02-27 ENCOUNTER — Other Ambulatory Visit: Payer: Self-pay | Admitting: *Deleted

## 2020-02-27 NOTE — Patient Outreach (Signed)
[facility]) Care Management  02/27/2020  Blake Mcknight 06/22/1955 [id number]   Voice message received from sister after missed call.  Outgoing call placed, she report member continues to progress well.  He has had the usual intermittent shortness of breath that is relieved with inhalers and nebulizers, denies having any further issue with abdominal pain.  She continue to struggle to keep him on the recommended diet, but state she feed him what he likes and will eat.  Denies any urgent concerns, encouraged to contact this care manager with questions.  Will follow up within the next month.  Goals Addressed            This Visit's Progress   . [facility] - Make and Keep All Appointments   On track    Follow Up Date 11/30   - ask family or friend for a ride - keep a calendar with appointment dates    Why is this important?   Part of staying healthy is seeing the doctor for follow-up care.  If you forget your appointments, there are some things you can do to stay on track.    Notes:   11/11 - Sister will call to schedule PCP appointment    . [facility] - Symptom Exacerbation Prevented or Minimized       Follow up date:  05/01/2020  Notes: 12/13 - Long term COPD management discussed with sister, discussed management of inhalers and nebulizers      [name], RN, MSN [place] Manager [phone]

## 2020-02-29 ENCOUNTER — Ambulatory Visit: Payer: Self-pay | Admitting: *Deleted

## 2020-03-26 ENCOUNTER — Other Ambulatory Visit: Payer: Self-pay | Admitting: *Deleted

## 2020-03-26 NOTE — Patient Outreach (Signed)
[facility]) Care Management  03/26/2020  Blake Mcknight 12-09-1955 [id number]   Outgoing call placed to member's sister, initially no answer but she immediately returned call.  State member is in his usual state of health.  Intermittent shortness of breath with activities, relieved with rest and inhaler/nebulizer.  He has remained stable with COPD management since April 2021 (last hospitalization).  Discussed ongoing chronic disease management, sister agrees.  Denies any urgent concerns, agrees to contact this care manager with any questions.  Will place referral to health coach and notify PCP of transition.  Goals Addressed            This Visit's Progress   . [facility] - Make and Keep All Appointments   On track    Follow Up Date 11/30   - ask family or friend for a ride - keep a calendar with appointment dates    Why is this important?   Part of staying healthy is seeing the doctor for follow-up care.  If you forget your appointments, there are some things you can do to stay on track.    Notes:   11/11 - Sister will call to schedule PCP appointment    . [facility] - Symptom Exacerbation Prevented or Minimized   On track    Follow up date:  05/01/2020  Notes: 12/13 - Long term COPD management discussed with sister, discussed management of inhalers and nebulizers      [name], RN, MSN [place] Manager [phone]

## 2020-03-27 ENCOUNTER — Other Ambulatory Visit: Payer: Self-pay | Admitting: *Deleted

## 2020-03-30 ENCOUNTER — Emergency Department (HOSPITAL_COMMUNITY): Payer: Medicare Other

## 2020-03-30 ENCOUNTER — Encounter (HOSPITAL_COMMUNITY): Payer: Self-pay | Admitting: *Deleted

## 2020-03-30 ENCOUNTER — Emergency Department (HOSPITAL_COMMUNITY)
Admission: EM | Admit: 2020-03-30 | Discharge: 2020-03-30 | Disposition: A | Discharge status: Home / Self Care | Payer: Medicare Other | Attending: Emergency Medicine | Admitting: Emergency Medicine

## 2020-03-30 DIAGNOSIS — Z20822 Contact with and (suspected) exposure to covid-19: Secondary | ICD-10-CM | POA: Diagnosis not present

## 2020-03-30 DIAGNOSIS — I10 Essential (primary) hypertension: Secondary | ICD-10-CM | POA: Diagnosis not present

## 2020-03-30 DIAGNOSIS — Z7951 Long term (current) use of inhaled steroids: Secondary | ICD-10-CM | POA: Diagnosis not present

## 2020-03-30 DIAGNOSIS — J441 Chronic obstructive pulmonary disease with (acute) exacerbation: Secondary | ICD-10-CM | POA: Insufficient documentation

## 2020-03-30 DIAGNOSIS — F17210 Nicotine dependence, cigarettes, uncomplicated: Secondary | ICD-10-CM | POA: Insufficient documentation

## 2020-03-30 DIAGNOSIS — R0902 Hypoxemia: Secondary | ICD-10-CM | POA: Diagnosis not present

## 2020-03-30 DIAGNOSIS — R0602 Shortness of breath: Secondary | ICD-10-CM | POA: Diagnosis not present

## 2020-03-30 DIAGNOSIS — Z79899 Other long term (current) drug therapy: Secondary | ICD-10-CM | POA: Insufficient documentation

## 2020-03-30 DIAGNOSIS — R000 Tachycardia, unspecified: Secondary | ICD-10-CM | POA: Diagnosis not present

## 2020-03-30 DIAGNOSIS — J80 Acute respiratory distress syndrome: Secondary | ICD-10-CM | POA: Diagnosis not present

## 2020-03-30 DIAGNOSIS — R059 Cough, unspecified: Secondary | ICD-10-CM | POA: Diagnosis not present

## 2020-03-30 DIAGNOSIS — R0689 Other abnormalities of breathing: Secondary | ICD-10-CM | POA: Diagnosis not present

## 2020-03-30 DIAGNOSIS — J439 Emphysema, unspecified: Secondary | ICD-10-CM | POA: Diagnosis not present

## 2020-03-30 LAB — HEPATIC FUNCTION PANEL
ALT: 27 U/L (ref 0–44)
AST: 23 U/L (ref 15–41)
Albumin: 3.9 g/dL (ref 3.5–5.0)
Alkaline Phosphatase: 47 U/L (ref 38–126)
Bilirubin, Direct: 0.2 mg/dL (ref 0.0–0.2)
Indirect Bilirubin: 0.5 mg/dL (ref 0.3–0.9)
Total Bilirubin: 0.7 mg/dL (ref 0.3–1.2)
Total Protein: 6.9 g/dL (ref 6.5–8.1)

## 2020-03-30 LAB — BASIC METABOLIC PANEL
Anion gap: 13 (ref 5–15)
BUN: 5 mg/dL — ABNORMAL LOW (ref 8–23)
CO2: 23 mmol/L (ref 22–32)
Calcium: 9 mg/dL (ref 8.9–10.3)
Chloride: 98 mmol/L (ref 98–111)
Creatinine, Ser: 0.78 mg/dL (ref 0.61–1.24)
GFR, Estimated: >60 mL/min (ref >60)
Glucose, Bld: 139 mg/dL — ABNORMAL HIGH (ref 70–99)
Potassium: 4.6 mmol/L (ref 3.5–5.1)
Sodium: 134 mmol/L — ABNORMAL LOW (ref 135–145)

## 2020-03-30 LAB — RESP PANEL BY RT-PCR (FLU A&B, COVID) ARPGX2
Influenza A by PCR: NEGATIVE
Influenza B by PCR: NEGATIVE
SARS Coronavirus 2 by RT PCR: NEGATIVE

## 2020-03-30 LAB — CBC
HCT: 44.8 % (ref 39.0–52.0)
Hemoglobin: 15 g/dL (ref 13.0–17.0)
MCH: 29.8 pg (ref 26.0–34.0)
MCHC: 33.5 g/dL (ref 30.0–36.0)
MCV: 89.1 fL (ref 80.0–100.0)
Platelets: 225 10*3/uL (ref 150–400)
RBC: 5.03 MIL/uL (ref 4.22–5.81)
RDW: 13 % (ref 11.5–15.5)
WBC: 8.2 10*3/uL (ref 4.0–10.5)
nRBC: 0 % (ref 0.0–0.2)

## 2020-03-30 LAB — POC SARS CORONAVIRUS 2 AG -  ED: SARS Coronavirus 2 Ag: NEGATIVE

## 2020-03-30 MED ORDER — PREDNISONE 10 MG PO TABS: 40.0000 mg | ORAL_TABLET | Freq: Every day | ORAL | 0 refills | Status: AC

## 2020-03-30 MED ORDER — ALBUTEROL SULFATE (2.5 MG/3ML) 0.083% IN NEBU
5.0000 mg | INHALATION_SOLUTION | Freq: Once | RESPIRATORY_TRACT | Status: AC
  Administered 2020-03-30: 5 mg via RESPIRATORY_TRACT
  Filled 2020-03-30: qty 6

## 2020-03-30 MED ORDER — IPRATROPIUM BROMIDE 0.02 % IN SOLN
0.5000 mg | Freq: Once | RESPIRATORY_TRACT | Status: AC
  Administered 2020-03-30: 0.5 mg via RESPIRATORY_TRACT
  Filled 2020-03-30: qty 2.5

## 2020-03-30 MED ORDER — ALBUTEROL SULFATE HFA 108 (90 BASE) MCG/ACT IN AERS: 2.0000 | INHALATION_SPRAY | RESPIRATORY_TRACT | Status: DC | PRN

## 2020-03-30 NOTE — ED Notes (Signed)
Patient discharge instructions reviewed with the patient. The patient verbalized understanding. Patient discharged.

## 2020-03-30 NOTE — Discharge Instructions (Signed)
Continue taking home medications as prescribed. Take prednisone as prescribed. Continue using your home oxygen. Use albuterol every 4 hours for the next 2 days.  After this, use as needed for shortness of breath, wheezing, chest tightness. Return to the emergency room if you develop persistent difficulty breathing not relieved with the albuterol, high fevers, severe chest pain, or any new, worsening, or concerning symptoms.

## 2020-03-30 NOTE — ED Triage Notes (Signed)
Pt arrives via [facility] from home. Woke up this am with c/o SOB, hx of COPD. Took at home neb treatment, no relief. Tripoding on arrival, retractions. Decreased breath sounds bilaterally. Albuterol 15, Atrovent 0.5 given en route. IV established in the left ac. Wheezing throughout, gave Mg 2 gm and solumedrol 125, with improvement. Rhonchi cough. Denies fever. 148/100, hr 104, sats 91 on 2l (chronic for pt).

## 2020-03-30 NOTE — ED Provider Notes (Signed)
[facility] EMERGENCY DEPARTMENT Provider Note   CSN: [id number] Arrival date & time: 03/30/20  [id number]     History Chief Complaint  Patient presents with  . Shortness of Breath    Blake Mcknight is a 65 y.o. male presenting for evaluation of shortness of breath.  Level five caveat as patient is a poor historian due to psychiatric illness.  Patient states he woke up feeling short of breath.  He states he was feeling well yesterday.  He denies a history of COPD, however per chart review, he has significant COPD.  He denies tobacco use, but once again per chart review he smokes cigarettes.  Patient states he has had a chronic cough, unchanged from previous.  He denies fevers, chills, or sick contacts.  He states he is unvaccinated.  He states he took his daily breathing treatments, but no other of his home medications today. He denies CP.  Additional history obtained from EMS.  EMS states upon their arrival, patient was in respiratory distress, tripoding and working hard to breathe.  Sats 91% on his home 2 L of oxygen.  He received DuoNeb, mag and Solu-Medrol with improvement.  HPI     Past Medical History:  Diagnosis Date  . COPD (chronic obstructive pulmonary disease) (HCC)    with bullous emphysema.   Marland Kitchen Heavy cigarette smoker before 2003   pt claims only 10 cigs per day, never heavier amounts.   Marland Kitchen HLD (hyperlipidemia)   . Hypertension   . Schizophrenia ([place]) 06/28/2013   This is a chronic condition and he lives with family    Patient Active Problem List   Diagnosis Date Noted  . Hypoxia   . COPD exacerbation ([place]) 06/05/2019  . Do not resuscitate 02/18/2019  . Bullous emphysema ([place]) 07/26/2013  . Schizophrenia ([place]) 06/28/2013  . Tobacco use disorder 06/06/2013  . Abdominal pain 06/03/2013  . HTN (hypertension) 06/03/2013  . HLD (hyperlipidemia) 06/03/2013  . Chronic obstructive pulmonary disease with acute exacerbation ([place]) 06/03/2013  .  Calculus of bile duct without mention of cholecystitis or obstruction 06/03/2013    Past Surgical History:  Procedure Laterality Date  . CHOLECYSTECTOMY N/A 06/06/2013   Procedure: LAPAROSCOPIC CHOLECYSTECTOMY WITH ATTEMPTED INTRAOPERATIVE CHOLANGIOGRAM;  Surgeon: Zenovia Jarred, MD;  Location: [place];  Service: General;  Laterality: N/A;  . ERCP N/A 06/03/2013   Procedure: ENDOSCOPIC RETROGRADE CHOLANGIOPANCREATOGRAPHY (ERCP);  Surgeon: Ladene Artist, MD;  Location: [facility] ENDOSCOPY;  Service: Endoscopy;  Laterality: N/A;  . NO PAST SURGERIES         Family History  Problem Relation Age of Onset  . Diabetes Mellitus II Mother   . Diabetes Mother   . Heart disease Father   . Stroke Maternal Aunt   . CAD Neg Hx     Social History   Tobacco Use  . Smoking status: Current Every Day Smoker    Packs/day: 1.00    Years: 41.00    Pack years: 41.00    Types: Cigarettes  . Smokeless tobacco: Never Used  . Tobacco comment: Gets 4 puffs a day.   Vaping Use  . Vaping Use: Never used  Substance Use Topics  . Alcohol use: No  . Drug use: No    Home Medications Prior to Admission medications   Medication Sig Start Date End Date Taking? Authorizing Provider  predniSONE (DELTASONE) 10 MG tablet Take 4 tablets (40 mg total) by mouth daily for 5 days. 03/31/20 04/05/20 Yes Stevie Charter, PA-C  albuterol (PROVENTIL) (2.5 MG/3ML) 0.083% nebulizer solution Take 3 mLs (2.5 mg total) by nebulization every 3 (three) hours as needed for wheezing or shortness of breath. 02/20/19   Aline August, MD  albuterol (VENTOLIN HFA) 108 (90 Base) MCG/ACT inhaler Inhale 2 puffs into the lungs every 4 (four) hours as needed for wheezing or shortness of breath. 02/20/19   Aline August, MD  Ascorbic Acid (VITAMIN C PO) Take 1 tablet by mouth 3 (three) times a week.     [provider]  atorvastatin (LIPITOR) 10 MG tablet Take 10 mg by mouth daily at 6 PM.  09/21/15   [provider]   azithromycin (ZITHROMAX Z-PAK) 250 MG tablet Take 1 tablet (250 mg total) by mouth daily. 500mg  PO day 1, then 250mg  PO days 205 08/29/19   Little, Wenda Overland, MD  benztropine (COGENTIN) 2 MG tablet Take 1 tablet (2 mg total) by mouth 2 (two) times daily. 02/02/20   Arfeen, Arlyce Harman, MD  carvedilol (COREG) 3.125 MG tablet Take 1 tablet (3.125 mg total) by mouth 2 (two) times daily with a meal. Patient taking differently: Take 3.125 mg by mouth every morning.  02/20/19   Aline August, MD  cetirizine (ZYRTEC) 10 MG tablet Take 10 mg by mouth daily as needed for allergies or rhinitis.    [provider]  cholecalciferol (VITAMIN D) 25 MCG (1000 UNIT) tablet Take 4,000 Units by mouth every evening.     [provider]  Cyanocobalamin (B-12 PO) Take 1 tablet by mouth every other day.     [provider]  enalapril (VASOTEC) 10 MG tablet Take 10 mg by mouth daily. 06/04/16   [provider]  famotidine (PEPCID) 40 MG tablet Take 40 mg by mouth 2 (two) times daily.  12/28/18   [provider]  Fluticasone-Salmeterol (ADVAIR DISKUS) 250-50 MCG/DOSE AEPB Inhale 1 puff into the lungs 2 (two) times daily. 06/23/19 09/21/19  Harvie Heck, MD  guaiFENesin-dextromethorphan (ROBITUSSIN DM) 100-10 MG/5ML syrup Take 10 mLs by mouth every 4 (four) hours as needed for cough. 02/20/19   Aline August, MD  mirtazapine (REMERON) 30 MG tablet Take 1 tablet (30 mg total) by mouth at bedtime. 02/02/20 02/01/21  Arfeen, Arlyce Harman, MD  montelukast (SINGULAIR) 10 MG tablet Take 1 tablet (10 mg total) by mouth daily. 06/23/19 09/21/19  Harvie Heck, MD  multivitamin (ONE-A-DAY MEN'S) TABS tablet Take 1 tablet by mouth daily.    [provider]  nicotine (NICODERM CQ - DOSED IN MG/24 HOURS) 14 mg/24hr patch Place 1 patch (14 mg total) onto the skin daily. 02/20/19   Aline August, MD  omeprazole (PRILOSEC) 20 MG capsule Take 1 capsule (20 mg total) by mouth daily. Patient not taking:  Reported on 06/20/2019 03/14/19   Domenic Moras, PA-C  OXYGEN Inhale 2 L/min into the lungs continuous.     [provider]  polyethylene glycol (MIRALAX / GLYCOLAX) 17 g packet Take 17 g by mouth daily as needed for mild constipation.    [provider]  risperiDONE (RISPERDAL) 3 MG tablet Take 1 tablet (3 mg total) by mouth 2 (two) times daily. 02/02/20   Arfeen, Arlyce Harman, MD  sodium chloride (OCEAN) 0.65 % SOLN nasal spray Place 1 spray into both nostrils as needed for congestion.    [provider]  SPIRULINA PO Take 1 capsule by mouth daily.    [provider]  traZODone (DESYREL) 50 MG tablet Take 1 tablet (50 mg total) by  mouth at bedtime as needed for sleep. 05/09/19   Arfeen, Arlyce Harman, MD    Allergies    Patient has no known allergies.  Review of Systems   Review of Systems  Respiratory: Positive for cough, shortness of breath and wheezing.   All other systems reviewed and are negative.   Physical Exam Updated Vital Signs BP (!) 142/86   Pulse (!) 109   Temp 98 F (36.7 C) (Temporal)   Resp (!) 25   SpO2 100%   Physical Exam Vitals and nursing note reviewed.  Constitutional:      General: He is not in acute distress.    Appearance: He is well-developed and well-nourished.     Comments: Chronically ill-appearing, not in acute distress  HENT:     Head: Normocephalic and atraumatic.  Eyes:     Extraocular Movements: Extraocular movements intact and EOM normal.     Conjunctiva/sclera: Conjunctivae normal.     Pupils: Pupils are equal, round, and reactive to light.  Cardiovascular:     Rate and Rhythm: Normal rate and regular rhythm.     Pulses: Normal pulses and intact distal pulses.  Pulmonary:     Effort: Pulmonary effort is normal. No respiratory distress.     Breath sounds: Decreased air movement present. Wheezing present.     Comments: Diminished lung sounds throughout.  Expiratory wheezing in all fields.  Minimal accessory muscle use,  but no tripoding.  Sats 100% on 2 L via nasal cannula, baseline O2 demand. Abdominal:     General: There is no distension.     Palpations: Abdomen is soft. There is no mass.     Tenderness: There is no abdominal tenderness. There is no guarding or rebound.  Musculoskeletal:        General: Normal range of motion.     Cervical back: Normal range of motion and neck supple.     Right lower leg: No edema.     Left lower leg: No edema.  Skin:    General: Skin is warm and dry.     Capillary Refill: Capillary refill takes less than 2 seconds.  Neurological:     Mental Status: He is alert and oriented to person, place, and time.  Psychiatric:        Mood and Affect: Mood and affect normal.     ED Results / Procedures / Treatments   Labs (all labs ordered are listed, but only abnormal results are displayed) Labs Reviewed  BASIC METABOLIC PANEL - Abnormal; Notable for the following components:      Result Value   Sodium 134 (*)    Glucose, Bld 139 (*)    BUN 5 (*)    All other components within normal limits  RESP PANEL BY RT-PCR (FLU A&B, COVID) ARPGX2  CBC  HEPATIC FUNCTION PANEL  POC SARS CORONAVIRUS 2 AG -  ED    EKG EKG Interpretation  Date/Time:  Friday March 30 2020 07:21:33 EST Ventricular Rate:  105 PR Interval:    QRS Duration: 88 QT Interval:  312 QTC Calculation: 413 R Axis:     Text Interpretation: Normal sinus rhythm Confirmed by Lennice Sites [phone]) on 03/30/2020 8:07:35 AM   Radiology DG Chest Portable 1 View  Result Date: 03/30/2020 CLINICAL DATA:  Cough and short of breath EXAM: PORTABLE CHEST 1 VIEW COMPARISON:  01/20/2020 FINDINGS: Advanced emphysema and scarring throughout both lungs is stable. No new findings. No heart failure or pneumonia. IMPRESSION: Advanced changes of  emphysema and diffuse pulmonary scarring. No superimposed acute abnormality. Electronically Signed   By: Franchot Gallo M.D.   On: 03/30/2020 07:59    Procedures Procedures  (including critical care time)  Medications Ordered in ED Medications  albuterol (VENTOLIN HFA) 108 (90 Base) MCG/ACT inhaler 2 puff (has no administration in time range)  ipratropium (ATROVENT) nebulizer solution 0.5 mg (0.5 mg Nebulization Given 03/30/20 0834)  albuterol (PROVENTIL) (2.5 MG/3ML) 0.083% nebulizer solution 5 mg (5 mg Nebulization Given 03/30/20 [id number])    ED Course  I have reviewed the triage vital signs and the nursing notes.  Pertinent labs & imaging results that were available during my care of the patient were reviewed by me and considered in my medical decision making (see chart for details).    MDM Rules/Calculators/A&P                          Patient presented for evaluation of shortness of breath.  On exam, patient does not appear in acute distress.  However per EMS, patient was in respiratory distress on arrival.  Patient with diminished lung sounds and wheezing throughout.  Significant history of COPD, likely the cause of his symptoms as they are improving with COPD treatment.  Less likely PE, CHF, or ACS.  Also consider viral or bacterial infection including COVID and pneumonia.  Will obtain labs, chest x-ray, EKG and continue COPD treatment and reassess.  Chest x-ray viewed interpreted by me, shows chronic lung disease but without acute infection.  No pneumothorax or effusion.  EKG without signs of ischemia.  Labs reassuring, no leukocytosis.  Electrolytes stable.  Rapid COVID test is negative, will send PCR.  On reassessment after breathing treatment, patient states he is feeling much better.  Increased air movement on pulmonary exam with decreased wheezing.  HR in the low 90's on repeat eval. Will make sure patient is able to ambulate with hypoxia, but likely will be able to discharge patient.  Patient ambulated without hypoxia.  Will discharge on prednisone and have him follow-up closely with his primary care doctor.  At this time, patient appears safe for  discharge.  Return precautions given.  Patient states he understands and agrees to plan.  Final Clinical Impression(s) / ED Diagnoses Final diagnoses:  COPD exacerbation ([place])    Rx / DC Orders ED Discharge Orders         Ordered    predniSONE (DELTASONE) 10 MG tablet  Daily        03/30/20 0937           Franchot [place], PA-C 03/30/20 [place], [place], DO 03/30/20 1036

## 2020-03-30 NOTE — ED Notes (Signed)
Ambulated patient in the room, patient's O2 saturation remained at 99% on 2L while ambulating. Patient was breathing around 30 times a minute while ambulating, patients respirations returned to 18 times a minute once they returned to bed. Patient reported feeling short of breath while getting up and moving.

## 2020-04-27 ENCOUNTER — Ambulatory Visit: Payer: Self-pay | Admitting: *Deleted

## 2020-04-28 ENCOUNTER — Other Ambulatory Visit (HOSPITAL_COMMUNITY): Payer: Self-pay | Admitting: Psychiatry

## 2020-04-28 DIAGNOSIS — F99 Mental disorder, not otherwise specified: Secondary | ICD-10-CM

## 2020-04-28 DIAGNOSIS — F5105 Insomnia due to other mental disorder: Secondary | ICD-10-CM

## 2020-04-28 DIAGNOSIS — F200 Paranoid schizophrenia: Secondary | ICD-10-CM

## 2020-05-02 ENCOUNTER — Encounter (HOSPITAL_COMMUNITY): Payer: Self-pay | Admitting: Psychiatry

## 2020-05-02 ENCOUNTER — Telehealth (INDEPENDENT_AMBULATORY_CARE_PROVIDER_SITE_OTHER): Payer: Medicare Other | Admitting: Psychiatry

## 2020-05-02 ENCOUNTER — Other Ambulatory Visit: Payer: Self-pay

## 2020-05-02 DIAGNOSIS — F200 Paranoid schizophrenia: Secondary | ICD-10-CM

## 2020-05-02 DIAGNOSIS — F5105 Insomnia due to other mental disorder: Secondary | ICD-10-CM | POA: Diagnosis not present

## 2020-05-02 DIAGNOSIS — F99 Mental disorder, not otherwise specified: Secondary | ICD-10-CM | POA: Diagnosis not present

## 2020-05-02 MED ORDER — BENZTROPINE MESYLATE 2 MG PO TABS: 2.0000 mg | ORAL_TABLET | Freq: Two times a day (BID) | ORAL | 0 refills | Status: DC

## 2020-05-02 MED ORDER — RISPERIDONE 3 MG PO TABS: 3.0000 mg | ORAL_TABLET | Freq: Two times a day (BID) | ORAL | 0 refills | Status: DC

## 2020-05-02 MED ORDER — MIRTAZAPINE 30 MG PO TABS: 30.0000 mg | ORAL_TABLET | Freq: Every day | ORAL | 0 refills | Status: DC

## 2020-05-02 NOTE — Progress Notes (Signed)
Virtual Visit via Telephone Note  I connected with Blake Mcknight on 05/02/20 at  2:20 PM EST by telephone and verified that I am speaking with the correct person using two identifiers.  Location: Patient: Home Provider: Office   I discussed the limitations, risks, security and privacy concerns of performing an evaluation and management service by telephone and the availability of in person appointments. I also discussed with the patient that there may be a patient responsible charge related to this service. The patient expressed understanding and agreed to proceed.   History of Present Illness: Patient is evaluated on the phone session.  He is a poor historian and his sister [name] was present on the phone.  His sister reported the patient is doing well and denies any recent agitation, anger, mood swings.  Patient is still occasionally hears voices but they are nonspecific and does not express any command in nature.  He is pleased that he is sleeping good.  He has not taken trazodone in a while.  His breathing is also improved.  He was seen in the emergency room for shortness of breath he is feeling much better now.  He has difficulty doing his ADLs for bathing and changing his clothes.  He is very involved in the plan.  Patient reported no tremors, shakes or any EPS.  Patient denies any suicidal thoughts or homicidal thoughts.  His appetite is okay and his weight is a stable.  Patient and his sister like to keep the current medication.   Past Psychiatric History:Reviewed. H/Oschizophrenia and admitted at [facility] years ago.H/Oparanoid, delusional, disorganized behavior and having mood swings.   Psychiatric Specialty Exam: Physical Exam  Review of Systems  Weight 160 lb (72.6 kg).There is no height or weight on file to calculate BMI.  General Appearance: NA  Eye Contact:  NA  Speech:  Slow and sometimes loose  Volume:  Decreased  Mood:  Euthymic  Affect:  NA  Thought  Process:  Descriptions of Associations: Circumstantial  Orientation:  Full (Time, Place, and Person)  Thought Content:  Hallucinations: non-specific hallucination  Suicidal Thoughts:  No  Homicidal Thoughts:  No  Memory:  Immediate;   Fair Recent;   Fair Remote;   Poor  Judgement:  Fair  Insight:  Shallow  Psychomotor Activity:  NA  Concentration:  Concentration: Fair and Attention Span: Fair  Recall:  Poor  Fund of Knowledge:  Poor  Language:  Fair  Akathisia:  No  Handed:  Right  AIMS (if indicated):     Assets:  Desire for Improvement Housing Social Support  ADL's:  Impaired  Cognition:  Impaired,  Mild  Sleep:   ok      Assessment and Plan: Schizophrenia chronic paranoid type.  Insomnia due to mental disorder.  Patient is psychiatrically stable.  He has been sleeping good.  He does not require trazodone lately and his sleep is good.  I will continue mirtazapine 30 mg at bedtime, Cogentin 2 mg twice a day, Risperdal 3 mg twice a day.  Recommended to call us back if he has any questions or any concerns.  Follow-up in 3 months.    Follow Up Instructions:    I discussed the assessment and treatment plan with the patient. The patient was provided an opportunity to ask questions and all were answered. The patient agreed with the plan and demonstrated an understanding of the instructions.   The patient was advised to call back or seek an in-person evaluation  if the symptoms worsen or if the condition fails to improve as anticipated.  I provided 14 minutes of non-face-to-face time during this encounter.   Kathlee Nations, MD

## 2020-05-09 ENCOUNTER — Other Ambulatory Visit: Payer: Self-pay | Admitting: *Deleted

## 2020-05-09 NOTE — Patient Outreach (Signed)
[facility]) Care Management   [place]  05/09/2020   Blake Mcknight 1955-03-25 [id number]  RN Health Coach telephone call to patient.  [employer] compliance verified. RN spoke with sister [name] guardian. She stated patient was doing ok with good days and some bad days. Patient is using his inhaler and nebulizer as per ordered. He is using his O2  At 2 L most of the day and night. Patient sister discussed getting a light weight walker for patient when going long distances. Patient has a rollator but sister says it is heavy when putting in the car. RN discussed places for her to look and see if that is what she is looking for. Patient sister has agreed to further outreach calls.   Encounter Medications:  Outpatient Encounter Medications as of 05/09/2020  Medication Sig  . albuterol (PROVENTIL) (2.5 MG/3ML) 0.083% nebulizer solution Take 3 mLs (2.5 mg total) by nebulization every 3 (three) hours as needed for wheezing or shortness of breath.  Marland Kitchen albuterol (VENTOLIN HFA) 108 (90 Base) MCG/ACT inhaler Inhale 2 puffs into the lungs every 4 (four) hours as needed for wheezing or shortness of breath.  . Ascorbic Acid (VITAMIN C PO) Take 1 tablet by mouth 3 (three) times a week.   Marland Kitchen atorvastatin (LIPITOR) 10 MG tablet Take 10 mg by mouth daily at 6 PM.   . azithromycin (ZITHROMAX Z-PAK) 250 MG tablet Take 1 tablet (250 mg total) by mouth daily. 500mg  PO day 1, then 250mg  PO days 205  . benztropine (COGENTIN) 2 MG tablet Take 1 tablet (2 mg total) by mouth 2 (two) times daily.  . carvedilol (COREG) 3.125 MG tablet Take 1 tablet (3.125 mg total) by mouth 2 (two) times daily with a meal. (Patient taking differently: Take 3.125 mg by mouth every morning. )  . cetirizine (ZYRTEC) 10 MG tablet Take 10 mg by mouth daily as needed for allergies or rhinitis.  . cholecalciferol (VITAMIN D) 25 MCG (1000 UNIT) tablet Take 4,000 Units by mouth every evening.   . Cyanocobalamin (B-12 PO) Take 1  tablet by mouth every other day.   . enalapril (VASOTEC) 10 MG tablet Take 10 mg by mouth daily.  . famotidine (PEPCID) 40 MG tablet Take 40 mg by mouth 2 (two) times daily.   . Fluticasone-Salmeterol (ADVAIR DISKUS) 250-50 MCG/DOSE AEPB Inhale 1 puff into the lungs 2 (two) times daily.  Marland Kitchen guaiFENesin-dextromethorphan (ROBITUSSIN DM) 100-10 MG/5ML syrup Take 10 mLs by mouth every 4 (four) hours as needed for cough.  . mirtazapine (REMERON) 30 MG tablet Take 1 tablet (30 mg total) by mouth at bedtime.  . montelukast (SINGULAIR) 10 MG tablet Take 1 tablet (10 mg total) by mouth daily.  . multivitamin (ONE-A-DAY MEN'S) TABS tablet Take 1 tablet by mouth daily.  . nicotine (NICODERM CQ - DOSED IN MG/24 HOURS) 14 mg/24hr patch Place 1 patch (14 mg total) onto the skin daily.  Marland Kitchen omeprazole (PRILOSEC) 20 MG capsule Take 1 capsule (20 mg total) by mouth daily. (Patient not taking: Reported on 06/20/2019)  . OXYGEN Inhale 2 L/min into the lungs continuous.   . polyethylene glycol (MIRALAX / GLYCOLAX) 17 g packet Take 17 g by mouth daily as needed for mild constipation.  . risperiDONE (RISPERDAL) 3 MG tablet Take 1 tablet (3 mg total) by mouth 2 (two) times daily.  . sodium chloride (OCEAN) 0.65 % SOLN nasal spray Place 1 spray into both nostrils as needed for congestion.  Marland Kitchen SPIRULINA PO  Take 1 capsule by mouth daily.  . traZODone (DESYREL) 50 MG tablet Take 1 tablet (50 mg total) by mouth at bedtime as needed for sleep. (Patient not taking: Reported on 05/02/2020)   No facility-administered encounter medications on file as of 05/09/2020.    Functional Status:  In your present state of health, do you have any difficulty performing the following activities: 07/08/2019 06/20/2019  Hearing? N N  Vision? N N  Difficulty concentrating or making decisions? Y Y  Comment Psych issues -  Walking or climbing stairs? N Y  Dressing or bathing? Y Y  Comment Sister helps -  Doing errands, shopping? Y Y  Comment  Sister helps with MD appointments -  Preparing Food and eating ? Y -  Comment sister provides meals -  Using the Toilet? N -  In the past six months, have you accidently leaked urine? Y -  Comment some incontinence -  Do you have problems with loss of bowel control? N -  Managing your Medications? Y -  Comment depends on sister -  Managing your Finances? Y -  Comment depends on sister -  Housekeeping or managing your Housekeeping? Y -  Comment depends on sister -  Some recent data might be hidden    Fall/Depression Screening: Fall Risk  01/06/2020 07/08/2019  Falls in the past year? 1 0  Number falls in past yr: 0 0  Injury with Fall? 0 0   PHQ 2/9 Scores 07/08/2019  Exception Documentation Medical reason    Assessment:  Goals Addressed            This Visit's Progress   . THN - Symptom Exacerbation Prevented or Minimized       Timeframe:  Long-Range Goal Priority:  High Start Date:      [id number]                       Expected End Date:     [id number]                 Follow up date:  [id number]  Notes: 12/13 - Long term COPD management discussed with sister, discussed management of inhalers and nebulizer [phone] Patient is still smoking randomly per sister not every day and just taking a puff.     . COMPLETED: THN - Track and Manage My Symptoms       Timeframe:  Long-Range Goal Priority:  High Start Date:        [id number]                     Expected End Date:   [id number]        - arrange in-home help services - develop a rescue plan - keep follow-up appointments    Why is this important?   Tracking your symptoms and other information about your health helps your doctor plan your care.  Write down the symptoms, the time of day, what you were doing and what medicine you are taking.  You will soon learn how to manage your symptoms.     Notes:        Plan:  Follow-up:  Patient agrees to Care Plan and Follow-up. Provided a calendar book for scheduling  appointments Provided ensure soupons Sent reminder for eye exam Provided Clinical key education on COPD exacerbation Provided Clinical key education on Eating plan for COPD Provided Clinical key education on COPD and physical activity RN will follow up outreach  within the month of May RN sent update assessment to PCP  [place] Management [phone]

## 2020-05-09 NOTE — Patient Instructions (Signed)
Goals Addressed            This Visit's Progress   . THN - Symptom Exacerbation Prevented or Minimized       Timeframe:  Long-Range Goal Priority:  High Start Date:      [id number]                       Expected End Date:     27782423                 Follow up date:  53614431  Notes: 12/13 - Long term COPD management discussed with sister, discussed management of inhalers and nebulizer [phone] Patient is still smoking randomly per sister not every day and just taking a puff.     . COMPLETED: THN - Track and Manage My Symptoms       Timeframe:  Long-Range Goal Priority:  High Start Date:        19509326                     Expected End Date:   [id number]        - arrange in-home help services - develop a rescue plan - keep follow-up appointments    Why is this important?   Tracking your symptoms and other information about your health helps your doctor plan your care.  Write down the symptoms, the time of day, what you were doing and what medicine you are taking.  You will soon learn how to manage your symptoms.     Notes:

## 2020-06-03 ENCOUNTER — Encounter (HOSPITAL_COMMUNITY): Payer: Self-pay | Admitting: Emergency Medicine

## 2020-06-03 ENCOUNTER — Emergency Department (HOSPITAL_COMMUNITY)
Admission: EM | Admit: 2020-06-03 | Discharge: 2020-06-03 | Disposition: A | Discharge status: Home / Self Care | Payer: Medicare Other | Attending: Emergency Medicine | Admitting: Emergency Medicine

## 2020-06-03 ENCOUNTER — Emergency Department (HOSPITAL_COMMUNITY): Payer: Medicare Other

## 2020-06-03 DIAGNOSIS — J449 Chronic obstructive pulmonary disease, unspecified: Secondary | ICD-10-CM | POA: Diagnosis not present

## 2020-06-03 DIAGNOSIS — R079 Chest pain, unspecified: Secondary | ICD-10-CM | POA: Diagnosis not present

## 2020-06-03 DIAGNOSIS — F17210 Nicotine dependence, cigarettes, uncomplicated: Secondary | ICD-10-CM | POA: Insufficient documentation

## 2020-06-03 DIAGNOSIS — K5750 Diverticulosis of both small and large intestine without perforation or abscess without bleeding: Secondary | ICD-10-CM | POA: Diagnosis not present

## 2020-06-03 DIAGNOSIS — E876 Hypokalemia: Secondary | ICD-10-CM | POA: Diagnosis not present

## 2020-06-03 DIAGNOSIS — R1013 Epigastric pain: Secondary | ICD-10-CM | POA: Insufficient documentation

## 2020-06-03 DIAGNOSIS — Z79899 Other long term (current) drug therapy: Secondary | ICD-10-CM | POA: Insufficient documentation

## 2020-06-03 DIAGNOSIS — J439 Emphysema, unspecified: Secondary | ICD-10-CM | POA: Diagnosis not present

## 2020-06-03 DIAGNOSIS — R109 Unspecified abdominal pain: Secondary | ICD-10-CM

## 2020-06-03 DIAGNOSIS — I700 Atherosclerosis of aorta: Secondary | ICD-10-CM | POA: Diagnosis not present

## 2020-06-03 DIAGNOSIS — Z9049 Acquired absence of other specified parts of digestive tract: Secondary | ICD-10-CM | POA: Diagnosis not present

## 2020-06-03 DIAGNOSIS — R110 Nausea: Secondary | ICD-10-CM | POA: Diagnosis not present

## 2020-06-03 DIAGNOSIS — I10 Essential (primary) hypertension: Secondary | ICD-10-CM | POA: Diagnosis not present

## 2020-06-03 DIAGNOSIS — Z7951 Long term (current) use of inhaled steroids: Secondary | ICD-10-CM | POA: Diagnosis not present

## 2020-06-03 LAB — COMPREHENSIVE METABOLIC PANEL
ALT: 30 U/L (ref 0–44)
AST: 21 U/L (ref 15–41)
Albumin: 3.7 g/dL (ref 3.5–5.0)
Alkaline Phosphatase: 40 U/L (ref 38–126)
Anion gap: 10 (ref 5–15)
BUN: <5 mg/dL — ABNORMAL LOW (ref 8–23)
CO2: 25 mmol/L (ref 22–32)
Calcium: 9.1 mg/dL (ref 8.9–10.3)
Chloride: 101 mmol/L (ref 98–111)
Creatinine, Ser: 0.79 mg/dL (ref 0.61–1.24)
GFR, Estimated: >60 mL/min (ref >60)
Glucose, Bld: 119 mg/dL — ABNORMAL HIGH (ref 70–99)
Potassium: 3.1 mmol/L — ABNORMAL LOW (ref 3.5–5.1)
Sodium: 136 mmol/L (ref 135–145)
Total Bilirubin: 0.5 mg/dL (ref 0.3–1.2)
Total Protein: 6.6 g/dL (ref 6.5–8.1)

## 2020-06-03 LAB — CBC
HCT: 42.7 % (ref 39.0–52.0)
Hemoglobin: 14.4 g/dL (ref 13.0–17.0)
MCH: 29.7 pg (ref 26.0–34.0)
MCHC: 33.7 g/dL (ref 30.0–36.0)
MCV: 88 fL (ref 80.0–100.0)
Platelets: 220 10*3/uL (ref 150–400)
RBC: 4.85 MIL/uL (ref 4.22–5.81)
RDW: 13.7 % (ref 11.5–15.5)
WBC: 10.9 10*3/uL — ABNORMAL HIGH (ref 4.0–10.5)
nRBC: 0 % (ref 0.0–0.2)

## 2020-06-03 LAB — TROPONIN I (HIGH SENSITIVITY)
Troponin I (High Sensitivity): 5 ng/L (ref <18)
Troponin I (High Sensitivity): 5 ng/L (ref <18)
Troponin I (High Sensitivity): 5 ng/L (ref <18)

## 2020-06-03 LAB — LIPASE, BLOOD: Lipase: 29 U/L (ref 11–51)

## 2020-06-03 MED ORDER — ALUM & MAG HYDROXIDE-SIMETH 200-200-20 MG/5ML PO SUSP
30.0000 mL | Freq: Once | ORAL | Status: AC
  Administered 2020-06-03: 30 mL via ORAL
  Filled 2020-06-03: qty 30

## 2020-06-03 MED ORDER — POTASSIUM CHLORIDE CRYS ER 20 MEQ PO TBCR
40.0000 meq | EXTENDED_RELEASE_TABLET | Freq: Once | ORAL | Status: AC
  Administered 2020-06-03: 40 meq via ORAL
  Filled 2020-06-03: qty 2

## 2020-06-03 MED ORDER — LIDOCAINE VISCOUS HCL 2 % MT SOLN
15.0000 mL | Freq: Once | OROMUCOSAL | Status: AC
  Administered 2020-06-03: 15 mL via ORAL
  Filled 2020-06-03: qty 15

## 2020-06-03 MED ORDER — HYOSCYAMINE SULFATE 0.125 MG SL SUBL
0.2500 mg | SUBLINGUAL_TABLET | Freq: Once | SUBLINGUAL | Status: AC
  Administered 2020-06-03: 0.25 mg via SUBLINGUAL
  Filled 2020-06-03: qty 2

## 2020-06-03 NOTE — ED Triage Notes (Signed)
C/o pain across chest and abd x 1 week with mild nausea.  Denies SOB.

## 2020-06-03 NOTE — ED Notes (Signed)
abd pain with nausea and vomiting

## 2020-06-03 NOTE — ED Provider Notes (Addendum)
[facility] EMERGENCY DEPARTMENT Provider Note   CSN: [id number] Arrival date & time: 06/03/20  1755     History Chief Complaint  Patient presents with  . Abdominal Pain  . Chest Pain    Blake Mcknight is a 65 y.o. male.  65 year old male presents with 2 weeks of abdominal discomfort which he characterizes as intermittent.  States is localized to his epigastric area.  Not associated with diaphoresis or dyspnea.  No nausea or vomiting.  Patient states his symptoms last for seconds to minutes.  No association with food.  Notes he had normal bowel movement yesterday.  Denies any urinary symptoms.  No treatment use prior to arrival        Past Medical History:  Diagnosis Date  . COPD (chronic obstructive pulmonary disease) (HCC)    with bullous emphysema.   Marland Kitchen Heavy cigarette smoker before 2003   pt claims only 10 cigs per day, never heavier amounts.   Marland Kitchen HLD (hyperlipidemia)   . Hypertension   . Schizophrenia ([place]) 06/28/2013   This is a chronic condition and he lives with family    Patient Active Problem List   Diagnosis Date Noted  . Hypoxia   . COPD exacerbation ([place]) 06/05/2019  . Do not resuscitate 02/18/2019  . Bullous emphysema ([place]) 07/26/2013  . Schizophrenia ([place]) 06/28/2013  . Tobacco use disorder 06/06/2013  . Abdominal pain 06/03/2013  . HTN (hypertension) 06/03/2013  . HLD (hyperlipidemia) 06/03/2013  . Chronic obstructive pulmonary disease with acute exacerbation ([place]) 06/03/2013  . Calculus of bile duct without mention of cholecystitis or obstruction 06/03/2013    Past Surgical History:  Procedure Laterality Date  . CHOLECYSTECTOMY N/A 06/06/2013   Procedure: LAPAROSCOPIC CHOLECYSTECTOMY WITH ATTEMPTED INTRAOPERATIVE CHOLANGIOGRAM;  Surgeon: Zenovia Jarred, MD;  Location: [facility];  Service: General;  Laterality: N/A;  . ERCP N/A 06/03/2013   Procedure: ENDOSCOPIC RETROGRADE CHOLANGIOPANCREATOGRAPHY (ERCP);  Surgeon: Ladene Artist, MD;  Location: [facility] ENDOSCOPY;  Service: Endoscopy;  Laterality: N/A;  . NO PAST SURGERIES         Family History  Problem Relation Age of Onset  . Diabetes Mellitus II Mother   . Diabetes Mother   . Heart disease Father   . Stroke Maternal Aunt   . CAD Neg Hx     Social History   Tobacco Use  . Smoking status: Current Every Day Smoker    Packs/day: 1.00    Years: 41.00    Pack years: 41.00    Types: Cigarettes  . Smokeless tobacco: Never Used  . Tobacco comment: Gets 4 puffs a day.   Vaping Use  . Vaping Use: Never used  Substance Use Topics  . Alcohol use: No  . Drug use: No    Home Medications Prior to Admission medications   Medication Sig Start Date End Date Taking? Authorizing Provider  albuterol (PROVENTIL) (2.5 MG/3ML) 0.083% nebulizer solution Take 3 mLs (2.5 mg total) by nebulization every 3 (three) hours as needed for wheezing or shortness of breath. 02/20/19   Aline August, MD  albuterol (VENTOLIN HFA) 108 (90 Base) MCG/ACT inhaler Inhale 2 puffs into the lungs every 4 (four) hours as needed for wheezing or shortness of breath. 02/20/19   Aline August, MD  Ascorbic Acid (VITAMIN C PO) Take 1 tablet by mouth 3 (three) times a week.     [provider]  atorvastatin (LIPITOR) 10 MG tablet Take 10 mg by mouth daily at 6 PM.  09/21/15   [provider]  azithromycin (ZITHROMAX Z-PAK) 250 MG tablet Take 1 tablet (250 mg total) by mouth daily. 500mg  PO day 1, then 250mg  PO days 205 08/29/19   Little, Wenda Overland, MD  benztropine (COGENTIN) 2 MG tablet Take 1 tablet (2 mg total) by mouth 2 (two) times daily. 05/02/20   Arfeen, Arlyce Harman, MD  carvedilol (COREG) 3.125 MG tablet Take 1 tablet (3.125 mg total) by mouth 2 (two) times daily with a meal. Patient taking differently: Take 3.125 mg by mouth every morning.  02/20/19   Aline August, MD  cetirizine (ZYRTEC) 10 MG tablet Take 10 mg by mouth daily as needed for allergies or rhinitis.    [provider]  cholecalciferol (VITAMIN D) 25 MCG (1000 UNIT) tablet Take 4,000 Units by mouth every evening.     [provider]  Cyanocobalamin (B-12 PO) Take 1 tablet by mouth every other day.     [provider]  enalapril (VASOTEC) 10 MG tablet Take 10 mg by mouth daily. 06/04/16   [provider]  famotidine (PEPCID) 40 MG tablet Take 40 mg by mouth 2 (two) times daily.  12/28/18   [provider]  Fluticasone-Salmeterol (ADVAIR DISKUS) 250-50 MCG/DOSE AEPB Inhale 1 puff into the lungs 2 (two) times daily. 06/23/19 09/21/19  Harvie Heck, MD  guaiFENesin-dextromethorphan (ROBITUSSIN DM) 100-10 MG/5ML syrup Take 10 mLs by mouth every 4 (four) hours as needed for cough. 02/20/19   Aline August, MD  mirtazapine (REMERON) 30 MG tablet Take 1 tablet (30 mg total) by mouth at bedtime. 05/02/20 05/02/21  Arfeen, Arlyce Harman, MD  montelukast (SINGULAIR) 10 MG tablet Take 1 tablet (10 mg total) by mouth daily. 06/23/19 09/21/19  Harvie Heck, MD  multivitamin (ONE-A-DAY MEN'S) TABS tablet Take 1 tablet by mouth daily.    [provider]  nicotine (NICODERM CQ - DOSED IN MG/24 HOURS) 14 mg/24hr patch Place 1 patch (14 mg total) onto the skin daily. 02/20/19   Aline August, MD  omeprazole (PRILOSEC) 20 MG capsule Take 1 capsule (20 mg total) by mouth daily. Patient not taking: Reported on 06/20/2019 03/14/19   Domenic Moras, PA-C  OXYGEN Inhale 2 L/min into the lungs continuous.     [provider]  polyethylene glycol (MIRALAX / GLYCOLAX) 17 g packet Take 17 g by mouth daily as needed for mild constipation.    [provider]  risperiDONE (RISPERDAL) 3 MG tablet Take 1 tablet (3 mg total) by mouth 2 (two) times daily. 05/02/20   Arfeen, Arlyce Harman, MD  sodium chloride (OCEAN) 0.65 % SOLN nasal spray Place 1 spray into both nostrils as needed for congestion.    [provider]  SPIRULINA PO Take 1 capsule by mouth daily.    [provider]   traZODone (DESYREL) 50 MG tablet Take 1 tablet (50 mg total) by mouth at bedtime as needed for sleep. Patient not taking: Reported on 05/02/2020 05/09/19   Kathlee Nations, MD    Allergies    Patient has no known allergies.  Review of Systems   Review of Systems  All other systems reviewed and are negative.   Physical Exam Updated Vital Signs BP (!) 153/103 (BP Location: Right Arm)   Pulse 98   Temp 98 F (36.7 C)   Resp 20   SpO2 99%   Physical Exam Vitals and nursing note reviewed.  Constitutional:      General: He is not in acute distress.  Appearance: Normal appearance. He is well-developed. He is not toxic-appearing.  HENT:     Head: Normocephalic and atraumatic.  Eyes:     General: Lids are normal.     Conjunctiva/sclera: Conjunctivae normal.     Pupils: Pupils are equal, round, and reactive to light.  Neck:     Thyroid: No thyroid mass.     Trachea: No tracheal deviation.  Cardiovascular:     Rate and Rhythm: Normal rate and regular rhythm.     Heart sounds: Normal heart sounds. No murmur heard. No gallop.   Pulmonary:     Effort: Pulmonary effort is normal. No respiratory distress.     Breath sounds: Normal breath sounds. No stridor. No decreased breath sounds, wheezing, rhonchi or rales.  Abdominal:     General: Bowel sounds are normal. There is no distension.     Palpations: Abdomen is soft.     Tenderness: There is no abdominal tenderness. There is no guarding or rebound.  Musculoskeletal:        General: No tenderness. Normal range of motion.     Cervical back: Normal range of motion and neck supple.  Skin:    General: Skin is warm and dry.     Findings: No abrasion or rash.  Neurological:     Mental Status: He is alert and oriented to person, place, and time.     GCS: GCS eye subscore is 4. GCS verbal subscore is 5. GCS motor subscore is 6.     Cranial Nerves: No cranial nerve deficit.     Sensory: No sensory deficit.  Psychiatric:         Speech: Speech normal.        Behavior: Behavior normal.     ED Results / Procedures / Treatments   Labs (all labs ordered are listed, but only abnormal results are displayed) Labs Reviewed  COMPREHENSIVE METABOLIC PANEL - Abnormal; Notable for the following components:      Result Value   Potassium 3.1 (*)    Glucose, Bld 119 (*)    BUN <5 (*)    All other components within normal limits  CBC - Abnormal; Notable for the following components:   WBC 10.9 (*)    All other components within normal limits  LIPASE, BLOOD  URINALYSIS, ROUTINE W REFLEX MICROSCOPIC  TROPONIN I (HIGH SENSITIVITY)  TROPONIN I (HIGH SENSITIVITY)    EKG ED ECG REPORT   Date: 06/03/2020  Rate: 84  Rhythm: normal sinus rhythm  QRS Axis: normal  Intervals: normal  ST/T Wave abnormalities: nonspecific ST changes  Conduction Disutrbances:none  Narrative Interpretation:   Old EKG Reviewed: none available  I have personally reviewed the EKG tracing and agree with the computerized printout as noted.   Radiology DG Chest 2 View  Result Date: 06/03/2020 CLINICAL DATA:  Emphysema, chest pain EXAM: CHEST - 2 VIEW COMPARISON:  03/30/2020 FINDINGS: Severe bullous emphysema. Scarring throughout the lungs. Heart is normal size. No effusions or acute bony abnormality. IMPRESSION: Severe emphysema.  No active cardiopulmonary disease. Electronically Signed   By: Rolm Baptise M.D.   On: 06/03/2020 18:52    Procedures Procedures   Medications Ordered in ED Medications  potassium chloride SA (KLOR-CON) CR tablet 40 mEq (has no administration in time range)  hyoscyamine (LEVSIN SL) SL tablet 0.25 mg (has no administration in time range)  alum & mag hydroxide-simeth (MAALOX/MYLANTA) 200-200-20 MG/5ML suspension 30 mL (has no administration in time range)    And  lidocaine (XYLOCAINE) 2 % viscous mouth solution 15 mL (has no administration in time range)    ED Course  I have reviewed the triage vital signs and  the nursing notes.  Pertinent labs & imaging results that were available during my care of the patient were reviewed by me and considered in my medical decision making (see chart for details).    MDM Rules/Calculators/A&P                          Patient with mild hypokalemia here treated with oral potassium.  Abdominal CT without acute process.  Had long discussion with patient's sister and patient has been having symptoms for quite some time.  Recently treated patient for constipation which did seem to improve his symptoms.  Will discharge home Final Clinical Impression(s) / ED Diagnoses Final diagnoses:  None    Rx / DC Orders ED Discharge Orders    None       Lacretia Leigh, MD 06/03/20 2235    Lacretia Leigh, MD 06/03/20 2236

## 2020-06-03 NOTE — Discharge Instructions (Signed)
Follow-up with your doctor for repeat potassium this week

## 2020-06-11 DIAGNOSIS — E559 Vitamin D deficiency, unspecified: Secondary | ICD-10-CM | POA: Diagnosis not present

## 2020-06-11 DIAGNOSIS — M159 Polyosteoarthritis, unspecified: Secondary | ICD-10-CM | POA: Diagnosis not present

## 2020-06-11 DIAGNOSIS — E7800 Pure hypercholesterolemia, unspecified: Secondary | ICD-10-CM | POA: Diagnosis not present

## 2020-06-11 DIAGNOSIS — F319 Bipolar disorder, unspecified: Secondary | ICD-10-CM | POA: Diagnosis not present

## 2020-06-11 DIAGNOSIS — K2100 Gastro-esophageal reflux disease with esophagitis, without bleeding: Secondary | ICD-10-CM | POA: Diagnosis not present

## 2020-06-11 DIAGNOSIS — I119 Hypertensive heart disease without heart failure: Secondary | ICD-10-CM | POA: Diagnosis not present

## 2020-06-11 DIAGNOSIS — K267 Chronic duodenal ulcer without hemorrhage or perforation: Secondary | ICD-10-CM | POA: Diagnosis not present

## 2020-06-11 DIAGNOSIS — M899 Disorder of bone, unspecified: Secondary | ICD-10-CM | POA: Diagnosis not present

## 2020-06-11 DIAGNOSIS — M2550 Pain in unspecified joint: Secondary | ICD-10-CM | POA: Diagnosis not present

## 2020-06-11 DIAGNOSIS — J449 Chronic obstructive pulmonary disease, unspecified: Secondary | ICD-10-CM | POA: Diagnosis not present

## 2020-06-11 DIAGNOSIS — Z79899 Other long term (current) drug therapy: Secondary | ICD-10-CM | POA: Diagnosis not present

## 2020-06-11 DIAGNOSIS — N401 Enlarged prostate with lower urinary tract symptoms: Secondary | ICD-10-CM | POA: Diagnosis not present

## 2020-06-12 ENCOUNTER — Other Ambulatory Visit (HOSPITAL_COMMUNITY): Payer: Self-pay | Admitting: Psychiatry

## 2020-06-12 DIAGNOSIS — F200 Paranoid schizophrenia: Secondary | ICD-10-CM

## 2020-06-15 ENCOUNTER — Other Ambulatory Visit: Payer: Self-pay

## 2020-06-15 ENCOUNTER — Emergency Department (HOSPITAL_COMMUNITY)
Admission: EM | Admit: 2020-06-15 | Discharge: 2020-06-16 | Disposition: A | Discharge status: Home / Self Care | Payer: Medicare Other | Attending: Emergency Medicine | Admitting: Emergency Medicine

## 2020-06-15 ENCOUNTER — Encounter (HOSPITAL_COMMUNITY): Payer: Self-pay

## 2020-06-15 ENCOUNTER — Emergency Department (HOSPITAL_COMMUNITY): Payer: Medicare Other

## 2020-06-15 DIAGNOSIS — F17210 Nicotine dependence, cigarettes, uncomplicated: Secondary | ICD-10-CM | POA: Diagnosis not present

## 2020-06-15 DIAGNOSIS — R0602 Shortness of breath: Secondary | ICD-10-CM | POA: Diagnosis not present

## 2020-06-15 DIAGNOSIS — Z7951 Long term (current) use of inhaled steroids: Secondary | ICD-10-CM | POA: Insufficient documentation

## 2020-06-15 DIAGNOSIS — J439 Emphysema, unspecified: Secondary | ICD-10-CM | POA: Diagnosis not present

## 2020-06-15 DIAGNOSIS — J189 Pneumonia, unspecified organism: Secondary | ICD-10-CM

## 2020-06-15 DIAGNOSIS — Z79899 Other long term (current) drug therapy: Secondary | ICD-10-CM | POA: Diagnosis not present

## 2020-06-15 DIAGNOSIS — J441 Chronic obstructive pulmonary disease with (acute) exacerbation: Secondary | ICD-10-CM | POA: Insufficient documentation

## 2020-06-15 DIAGNOSIS — I10 Essential (primary) hypertension: Secondary | ICD-10-CM | POA: Diagnosis not present

## 2020-06-15 LAB — COMPREHENSIVE METABOLIC PANEL
ALT: 47 U/L — ABNORMAL HIGH (ref 0–44)
AST: 27 U/L (ref 15–41)
Albumin: 3.8 g/dL (ref 3.5–5.0)
Alkaline Phosphatase: 47 U/L (ref 38–126)
Anion gap: 9 (ref 5–15)
BUN: 7 mg/dL — ABNORMAL LOW (ref 8–23)
CO2: 26 mmol/L (ref 22–32)
Calcium: 9.1 mg/dL (ref 8.9–10.3)
Chloride: 97 mmol/L — ABNORMAL LOW (ref 98–111)
Creatinine, Ser: 0.73 mg/dL (ref 0.61–1.24)
GFR, Estimated: >60 mL/min (ref >60)
Glucose, Bld: 115 mg/dL — ABNORMAL HIGH (ref 70–99)
Potassium: 4 mmol/L (ref 3.5–5.1)
Sodium: 132 mmol/L — ABNORMAL LOW (ref 135–145)
Total Bilirubin: 0.6 mg/dL (ref 0.3–1.2)
Total Protein: 7 g/dL (ref 6.5–8.1)

## 2020-06-15 LAB — CBC WITH DIFFERENTIAL/PLATELET
Abs Immature Granulocytes: 0.08 10*3/uL — ABNORMAL HIGH (ref 0.00–0.07)
Basophils Absolute: 0.1 10*3/uL (ref 0.0–0.1)
Basophils Relative: 1 %
Eosinophils Absolute: 0.2 10*3/uL (ref 0.0–0.5)
Eosinophils Relative: 2 %
HCT: 44.2 % (ref 39.0–52.0)
Hemoglobin: 15.2 g/dL (ref 13.0–17.0)
Immature Granulocytes: 1 %
Lymphocytes Relative: 16 %
Lymphs Abs: 2.4 10*3/uL (ref 0.7–4.0)
MCH: 30.8 pg (ref 26.0–34.0)
MCHC: 34.4 g/dL (ref 30.0–36.0)
MCV: 89.5 fL (ref 80.0–100.0)
Monocytes Absolute: 1.2 10*3/uL — ABNORMAL HIGH (ref 0.1–1.0)
Monocytes Relative: 8 %
Neutro Abs: 11.3 10*3/uL — ABNORMAL HIGH (ref 1.7–7.7)
Neutrophils Relative %: 72 %
Platelets: 375 10*3/uL (ref 150–400)
RBC: 4.94 MIL/uL (ref 4.22–5.81)
RDW: 13.8 % (ref 11.5–15.5)
WBC: 15.4 10*3/uL — ABNORMAL HIGH (ref 4.0–10.5)
nRBC: 0 % (ref 0.0–0.2)

## 2020-06-15 LAB — URINALYSIS, ROUTINE W REFLEX MICROSCOPIC
Bilirubin Urine: NEGATIVE
Glucose, UA: NEGATIVE mg/dL
Hgb urine dipstick: NEGATIVE
Ketones, ur: NEGATIVE mg/dL
Leukocytes,Ua: NEGATIVE
Nitrite: NEGATIVE
Protein, ur: NEGATIVE mg/dL
Specific Gravity, Urine: 1.005 (ref 1.005–1.030)
pH: 7 (ref 5.0–8.0)

## 2020-06-15 LAB — BRAIN NATRIURETIC PEPTIDE: B Natriuretic Peptide: 22.2 pg/mL (ref 0.0–100.0)

## 2020-06-15 NOTE — ED Triage Notes (Signed)
PT states hes been SOB for 6 months and has seen a Dr and on medications. Bad today, refused to take home medication and wanted to be seen by ER. PT on 2L of O2 at home. Requesting medication from hospital. PT also stating its burning when urinating for 5 months.

## 2020-06-15 NOTE — ED Provider Notes (Signed)
MSE was initiated and I personally evaluated the patient and placed orders (if any) at  2:31 PM on June 15, 2020.  The patient appears stable so that the remainder of the MSE may be completed by another provider.  Patient has history of COPD, Sister is legal guardian.  He reportedly refused home meds and wanted to be seen in the hospital.    Patient unable to provide additional history due to psychiatric condition.   Lungs with mild wheezing bilaterally.  BNP, labs and CXR ordered.    He is on oxygen at baseline.   Patient counseled on the need to stay for full evaluation.       Lorin Glass, PA-C 06/15/20 1435    Elnora Morrison, MD 06/16/20 1149

## 2020-06-16 DIAGNOSIS — J441 Chronic obstructive pulmonary disease with (acute) exacerbation: Secondary | ICD-10-CM | POA: Diagnosis not present

## 2020-06-16 MED ORDER — DOXYCYCLINE HYCLATE 100 MG PO CAPS: 100.0000 mg | ORAL_CAPSULE | Freq: Two times a day (BID) | ORAL | 0 refills | Status: DC

## 2020-06-16 MED ORDER — AMOXICILLIN-POT CLAVULANATE 875-125 MG PO TABS: 1.0000 | ORAL_TABLET | Freq: Two times a day (BID) | ORAL | 0 refills | Status: DC

## 2020-06-16 MED ORDER — ALBUTEROL SULFATE HFA 108 (90 BASE) MCG/ACT IN AERS
2.0000 | INHALATION_SPRAY | Freq: Once | RESPIRATORY_TRACT | Status: AC
  Administered 2020-06-16: 2 via RESPIRATORY_TRACT
  Filled 2020-06-16: qty 6.7

## 2020-06-16 MED ORDER — DOXYCYCLINE HYCLATE 100 MG PO TABS
100.0000 mg | ORAL_TABLET | Freq: Once | ORAL | Status: AC
  Administered 2020-06-16: 100 mg via ORAL
  Filled 2020-06-16: qty 1

## 2020-06-16 NOTE — ED Provider Notes (Signed)
[facility] EMERGENCY DEPARTMENT Provider Note   CSN: [id number] Arrival date & time: 06/15/20  1419     History Chief Complaint  Patient presents with  . Shortness of Breath    Blake Mcknight is a 65 y.o. male.  The history is provided by the patient.  Shortness of Breath Severity:  Moderate Onset quality:  Gradual Duration:  2 weeks Timing:  Constant Progression:  Worsening Chronicity:  New Relieved by:  Nothing Worsened by:  Nothing Associated symptoms: cough   Associated symptoms: no fever and no hemoptysis   Associated symptoms comment:  Chest pain with cough Patient with history of COPD on home oxygen, hypertension, schizophrenia presents with cough and shortness of breath.  Patient tells me that for the past 2 weeks had increasing shortness of breath.  He also reports cough with phlegm production.  No hemoptysis.  He reports chest pain with cough No lower extremity edema Patient claims he is cutting back on his cigarette use     Past Medical History:  Diagnosis Date  . COPD (chronic obstructive pulmonary disease) (HCC)    with bullous emphysema.   Marland Kitchen Heavy cigarette smoker before 2003   pt claims only 10 cigs per day, never heavier amounts.   Marland Kitchen HLD (hyperlipidemia)   . Hypertension   . Schizophrenia ([place]) 06/28/2013   This is a chronic condition and he lives with family    Patient Active Problem List   Diagnosis Date Noted  . Hypoxia   . COPD exacerbation ([place]) 06/05/2019  . Do not resuscitate 02/18/2019  . Bullous emphysema ([place]) 07/26/2013  . Schizophrenia ([place]) 06/28/2013  . Tobacco use disorder 06/06/2013  . Abdominal pain 06/03/2013  . HTN (hypertension) 06/03/2013  . HLD (hyperlipidemia) 06/03/2013  . Chronic obstructive pulmonary disease with acute exacerbation ([place]) 06/03/2013  . Calculus of bile duct without mention of cholecystitis or obstruction 06/03/2013    Past Surgical History:  Procedure Laterality Date  .  CHOLECYSTECTOMY N/A 06/06/2013   Procedure: LAPAROSCOPIC CHOLECYSTECTOMY WITH ATTEMPTED INTRAOPERATIVE CHOLANGIOGRAM;  Surgeon: Zenovia Jarred, MD;  Location: [facility];  Service: General;  Laterality: N/A;  . ERCP N/A 06/03/2013   Procedure: ENDOSCOPIC RETROGRADE CHOLANGIOPANCREATOGRAPHY (ERCP);  Surgeon: Ladene Artist, MD;  Location: [facility] ENDOSCOPY;  Service: Endoscopy;  Laterality: N/A;  . NO PAST SURGERIES         Family History  Problem Relation Age of Onset  . Diabetes Mellitus II Mother   . Diabetes Mother   . Heart disease Father   . Stroke Maternal Aunt   . CAD Neg Hx     Social History   Tobacco Use  . Smoking status: Current Every Day Smoker    Packs/day: 1.00    Years: 41.00    Pack years: 41.00    Types: Cigarettes  . Smokeless tobacco: Never Used  . Tobacco comment: Gets 4 puffs a day.   Vaping Use  . Vaping Use: Never used  Substance Use Topics  . Alcohol use: No  . Drug use: No    Home Medications Prior to Admission medications   Medication Sig Start Date End Date Taking? Authorizing Provider  albuterol (PROVENTIL) (2.5 MG/3ML) 0.083% nebulizer solution Take 3 mLs (2.5 mg total) by nebulization every 3 (three) hours as needed for wheezing or shortness of breath. 02/20/19   Aline August, MD  albuterol (VENTOLIN HFA) 108 (90 Base) MCG/ACT inhaler Inhale 2 puffs into the lungs every 4 (four) hours as needed for  wheezing or shortness of breath. 02/20/19   Aline August, MD  Ascorbic Acid (VITAMIN C PO) Take 1 tablet by mouth 3 (three) times a week.     [provider]  atorvastatin (LIPITOR) 10 MG tablet Take 10 mg by mouth daily at 6 PM.  09/21/15   [provider]  benztropine (COGENTIN) 2 MG tablet Take 1 tablet (2 mg total) by mouth 2 (two) times daily. 05/02/20   Arfeen, Arlyce Harman, MD  carvedilol (COREG) 3.125 MG tablet Take 1 tablet (3.125 mg total) by mouth 2 (two) times daily with a meal. Patient taking differently: Take 3.125 mg by mouth every  morning.  02/20/19   Aline August, MD  cetirizine (ZYRTEC) 10 MG tablet Take 10 mg by mouth daily as needed for allergies or rhinitis.    [provider]  cholecalciferol (VITAMIN D) 25 MCG (1000 UNIT) tablet Take 4,000 Units by mouth every evening.     [provider]  Cyanocobalamin (B-12 PO) Take 1 tablet by mouth every other day.     [provider]  enalapril (VASOTEC) 10 MG tablet Take 10 mg by mouth daily. 06/04/16   [provider]  famotidine (PEPCID) 40 MG tablet Take 40 mg by mouth 2 (two) times daily.  12/28/18   [provider]  Fluticasone-Salmeterol (ADVAIR DISKUS) 250-50 MCG/DOSE AEPB Inhale 1 puff into the lungs 2 (two) times daily. 06/23/19 09/21/19  Harvie Heck, MD  guaiFENesin-dextromethorphan (ROBITUSSIN DM) 100-10 MG/5ML syrup Take 10 mLs by mouth every 4 (four) hours as needed for cough. 02/20/19   Aline August, MD  mirtazapine (REMERON) 30 MG tablet Take 1 tablet (30 mg total) by mouth at bedtime. 05/02/20 05/02/21  Arfeen, Arlyce Harman, MD  montelukast (SINGULAIR) 10 MG tablet Take 1 tablet (10 mg total) by mouth daily. 06/23/19 09/21/19  Harvie Heck, MD  multivitamin (ONE-A-DAY MEN'S) TABS tablet Take 1 tablet by mouth daily.    [provider]  nicotine (NICODERM CQ - DOSED IN MG/24 HOURS) 14 mg/24hr patch Place 1 patch (14 mg total) onto the skin daily. 02/20/19   Aline August, MD  omeprazole (PRILOSEC) 20 MG capsule Take 1 capsule (20 mg total) by mouth daily. Patient not taking: Reported on 06/20/2019 03/14/19   Domenic Moras, PA-C  OXYGEN Inhale 2 L/min into the lungs continuous.     [provider]  polyethylene glycol (MIRALAX / GLYCOLAX) 17 g packet Take 17 g by mouth daily as needed for mild constipation.    [provider]  risperiDONE (RISPERDAL) 3 MG tablet Take 1 tablet (3 mg total) by mouth 2 (two) times daily. 05/02/20   Arfeen, Arlyce Harman, MD  sodium chloride (OCEAN) 0.65 % SOLN nasal spray Place 1 spray  into both nostrils as needed for congestion.    [provider]  SPIRULINA PO Take 1 capsule by mouth daily.    [provider]  traZODone (DESYREL) 50 MG tablet Take 1 tablet (50 mg total) by mouth at bedtime as needed for sleep. Patient not taking: Reported on 05/02/2020 05/09/19   Kathlee Nations, MD    Allergies    Patient has no known allergies.  Review of Systems   Review of Systems  Constitutional: Negative for fever.  Respiratory: Positive for cough and shortness of breath. Negative for hemoptysis.   Cardiovascular: Negative for leg swelling.  Genitourinary: Positive for dysuria.       Patient mentioned chronic dysuria for months  All other systems reviewed  and are negative.   Physical Exam Updated Vital Signs BP (!) 157/99 (BP Location: Right Arm)   Pulse (!) 110   Temp 97.7 F (36.5 C) (Oral)   Resp 20   SpO2 96%   Physical Exam CONSTITUTIONAL: Elderly, no acute distress HEAD: Normocephalic/atraumatic EYES: EOMI/PERRL ENMT: Mucous membranes moist NECK: supple no meningeal signs SPINE/BACK:entire spine nontender CV: S1/S2 noted, no murmurs/rubs/gallops noted LUNGS: Scattered wheeze bilaterally, no acute distress ABDOMEN: soft, nontender, no rebound or guarding, bowel sounds noted throughout abdomen GU:no cva tenderness NEURO: Pt is awake/alert/appropriate, moves all extremitiesx4.  No facial droop.   EXTREMITIES: pulses normal/equal, full ROM SKIN: warm, color normal PSYCH: no abnormalities of mood noted, alert and oriented to situation  ED Results / Procedures / Treatments   Labs (all labs ordered are listed, but only abnormal results are displayed) Labs Reviewed  URINALYSIS, ROUTINE W REFLEX MICROSCOPIC - Abnormal; Notable for the following components:      Result Value   Color, Urine STRAW (*)    All other components within normal limits  COMPREHENSIVE METABOLIC PANEL - Abnormal; Notable for the following components:   Sodium 132 (*)     Chloride 97 (*)    Glucose, Bld 115 (*)    BUN 7 (*)    ALT 47 (*)    All other components within normal limits  CBC WITH DIFFERENTIAL/PLATELET - Abnormal; Notable for the following components:   WBC 15.4 (*)    Neutro Abs 11.3 (*)    Monocytes Absolute 1.2 (*)    Abs Immature Granulocytes 0.08 (*)    All other components within normal limits  BRAIN NATRIURETIC PEPTIDE    EKG None  Radiology DG Chest 2 View  Result Date: 06/15/2020 CLINICAL DATA:  Shortness of breath EXAM: CHEST - 2 VIEW COMPARISON:  Chest x-ray 06/03/2020, CT 06/20/2019, chest x-ray 06/05/2019 FINDINGS: Advanced emphysematous disease with bullous disease bilaterally. Mild airspace opacities at the left apex and right infrahilar lung, new compared to prior and potentially representing foci of pneumonia. Normal heart size. No pneumothorax. IMPRESSION: Advanced bullous emphysematous disease. New mild airspace opacities at the left apex and right infrahilar lung potentially representing foci of pneumonia. Electronically Signed   By: Donavan Foil M.D.   On: 06/15/2020 15:30    Procedures Procedures   Medications Ordered in ED Medications  albuterol (VENTOLIN HFA) 108 (90 Base) MCG/ACT inhaler 2 puff (2 puffs Inhalation Given 06/16/20 0457)  doxycycline (VIBRA-TABS) tablet 100 mg (100 mg Oral Given 06/16/20 0457)    ED Course  I have reviewed the triage vital signs and the nursing notes.  Pertinent labs & imaging results that were available during my care of the patient were reviewed by me and considered in my medical decision making (see chart for details).    MDM Rules/Calculators/A&P                          Patient reports he been feeling increasing short of breath for the past 2 weeks.  Patient gives a thorough history and is in no acute distress.  He is resting comfortably.  Does appear that he may have a new infiltrate indicative of pneumonia.  However he is not septic appearing & does not appear to be  requiring increased oxygen requirement Will give albuterol inhaler and one-time dose of doxycycline.  6:09 AM Patient resting comfortably, no acute distress Discussed with sister who is his guardian. he is appropriate for discharge  home and outpatient management .  Continue home treatment for COPD We will add on antibiotics for pneumonia Final Clinical Impression(s) / ED Diagnoses Final diagnoses:  Community acquired pneumonia, unspecified laterality  COPD exacerbation ([place])    Rx / DC Orders ED Discharge Orders         Ordered    amoxicillin-clavulanate (AUGMENTIN) 875-125 MG tablet  2 times daily        06/16/20 0551    doxycycline (VIBRAMYCIN) 100 MG capsule  2 times daily        06/16/20 0551           Ripley Fraise, MD 06/16/20 [phone]

## 2020-07-25 ENCOUNTER — Other Ambulatory Visit: Payer: Self-pay

## 2020-07-25 ENCOUNTER — Telehealth (INDEPENDENT_AMBULATORY_CARE_PROVIDER_SITE_OTHER): Payer: Medicare Other | Admitting: Psychiatry

## 2020-07-25 ENCOUNTER — Encounter (HOSPITAL_COMMUNITY): Payer: Self-pay | Admitting: Psychiatry

## 2020-07-25 DIAGNOSIS — F200 Paranoid schizophrenia: Secondary | ICD-10-CM | POA: Diagnosis not present

## 2020-07-25 DIAGNOSIS — F5105 Insomnia due to other mental disorder: Secondary | ICD-10-CM

## 2020-07-25 DIAGNOSIS — F99 Mental disorder, not otherwise specified: Secondary | ICD-10-CM

## 2020-07-25 MED ORDER — MIRTAZAPINE 30 MG PO TABS: 30.0000 mg | ORAL_TABLET | Freq: Every day | ORAL | 0 refills | Status: DC

## 2020-07-25 MED ORDER — BENZTROPINE MESYLATE 2 MG PO TABS: 2.0000 mg | ORAL_TABLET | Freq: Two times a day (BID) | ORAL | 0 refills | Status: DC | PRN

## 2020-07-25 MED ORDER — RISPERIDONE 3 MG PO TABS: 3.0000 mg | ORAL_TABLET | Freq: Two times a day (BID) | ORAL | 0 refills | Status: DC

## 2020-07-25 NOTE — Progress Notes (Signed)
Virtual Visit via Telephone Note  I connected with Blake Mcknight on 07/25/20 at  2:20 PM EDT by telephone and verified that I am speaking with the correct person using two identifiers.  Location: Patient: Home Provider: Home Office   I discussed the limitations, risks, security and privacy concerns of performing an evaluation and management service by telephone and the availability of in person appointments. I also discussed with the patient that there may be a patient responsible charge related to this service. The patient expressed understanding and agreed to proceed.   History of Present Illness: Patient is evaluated on the phone.  His sister Blake Mcknight was also present on the phone.  Recently he had a visit to the ER for his shortness of breath.  He is feeling better.  Occasionally he talked to himself but no agitation, anger, crying spells.  He denies any suicidal thoughts.  He has residual hallucinations which are nonspecific.  His sister told that he is very stable and lately not acting out.  However he does require help for his ADLs as not able to change his clothes.  His sister has to cook for him.  Patient is a poor historian but denies any concerns or side effects from the medication.  Sister noticed some time complaining of constipation.  He is taking high-dose of benztropine for the tremors.  His sister reluctant to cut down the dose because benztropine helping his tremors.  Patient denies drinking or using any illegal substances.   Past Psychiatric History:Reviewed. H/Oschizophrenia and admitted at [facility] years ago.H/Oparanoid, delusional, disorganized behavior and having mood swings.  Recent Results (from the past 2160 hour(s))  Lipase, blood     Status: None   Collection Time: 06/03/20  6:15 PM  Result Value Ref Range   Lipase 29 11 - 51 U/L    Comment: Performed at [facility], [address] [place], [place] [address]  Comprehensive metabolic panel      Status: Abnormal   Collection Time: 06/03/20  6:15 PM  Result Value Ref Range   Sodium 136 135 - 145 mmol/L   Potassium 3.1 (L) 3.5 - 5.1 mmol/L   Chloride 101 98 - 111 mmol/L   CO2 25 22 - 32 mmol/L   Glucose, Bld 119 (H) 70 - 99 mg/dL    Comment: Glucose reference range applies only to samples taken after fasting for at least 8 hours.   BUN <5 (L) 8 - 23 mg/dL   Creatinine, Ser 0.79 0.61 - 1.24 mg/dL   Calcium 9.1 8.9 - 10.3 mg/dL   Total Protein 6.6 6.5 - 8.1 g/dL   Albumin 3.7 3.5 - 5.0 g/dL   AST 21 15 - 41 U/L   ALT 30 0 - 44 U/L   Alkaline Phosphatase 40 38 - 126 U/L   Total Bilirubin 0.5 0.3 - 1.2 mg/dL   GFR, Estimated >60 >60 mL/min    Comment: (NOTE) Calculated using the CKD-EPI Creatinine Equation (2021)    Anion gap 10 5 - 15    Comment: Performed at [facility] [address], [place] [address]  CBC     Status: Abnormal   Collection Time: 06/03/20  6:15 PM  Result Value Ref Range   WBC 10.9 (H) 4.0 - 10.5 K/uL   RBC 4.85 4.22 - 5.81 MIL/uL   Hemoglobin 14.4 13.0 - 17.0 g/dL   HCT 42.7 39.0 - 52.0 %   MCV 88.0 80.0 -  100.0 fL   MCH 29.7 26.0 - 34.0 pg   MCHC 33.7 30.0 - 36.0 g/dL   RDW 13.7 11.5 - 15.5 %   Platelets 220 150 - 400 K/uL   nRBC 0.0 0.0 - 0.2 %    Comment: Performed at [facility], [place] [address] [place], [place] [address]  Troponin I (High Sensitivity)     Status: None   Collection Time: 06/03/20  6:15 PM  Result Value Ref Range   Troponin I (High Sensitivity) 5 <18 ng/L    Comment: (NOTE) Elevated high sensitivity troponin I (hsTnI) values and significant  changes across serial measurements may suggest ACS but many other  chronic and acute conditions are known to elevate hsTnI results.  Refer to the Links section for chest pain algorithms and additional  guidance. Performed at [facility], [place] [address], [place], [place] [address]   Troponin I (High Sensitivity)     Status: None   Collection Time:  06/03/20  8:12 PM  Result Value Ref Range   Troponin I (High Sensitivity) 5 <18 ng/L    Comment: (NOTE) Elevated high sensitivity troponin I (hsTnI) values and significant  changes across serial measurements may suggest ACS but many other  chronic and acute conditions are known to elevate hsTnI results.  Refer to the "Links" section for chest pain algorithms and additional  guidance. Performed at [facility], [place] [address], [place], [place] [address]   Troponin I (High Sensitivity)     Status: None   Collection Time: 06/03/20  9:47 PM  Result Value Ref Range   Troponin I (High Sensitivity) 5 <18 ng/L    Comment: (NOTE) Elevated high sensitivity troponin I (hsTnI) values and significant  changes across serial measurements may suggest ACS but many other  chronic and acute conditions are known to elevate hsTnI results.  Refer to the "Links" section for chest pain algorithms and additional  guidance. Performed at [facility], [place] [address], [place], [place] [address]   Urinalysis, Routine w reflex microscopic Urine, Clean Catch     Status: Abnormal   Collection Time: 06/15/20  2:34 PM  Result Value Ref Range   Color, Urine STRAW (A) YELLOW   APPearance CLEAR CLEAR   Specific Gravity, Urine 1.005 1.005 - 1.030   pH 7.0 5.0 - 8.0   Glucose, UA NEGATIVE NEGATIVE mg/dL   Hgb urine dipstick NEGATIVE NEGATIVE   Bilirubin Urine NEGATIVE NEGATIVE   Ketones, ur NEGATIVE NEGATIVE mg/dL   Protein, ur NEGATIVE NEGATIVE mg/dL   Nitrite NEGATIVE NEGATIVE   Leukocytes,Ua NEGATIVE NEGATIVE    Comment: Performed at [facility] [address], [place], [place] [address]  Comprehensive metabolic panel     Status: Abnormal   Collection Time: 06/15/20  2:39 PM  Result Value Ref Range   Sodium 132 (L) 135 - 145 mmol/L   Potassium 4.0 3.5 - 5.1 mmol/L   Chloride 97 (L) 98 - 111 mmol/L   CO2 26 22 - 32 mmol/L   Glucose, Bld 115 (H) 70 - 99 mg/dL    Comment: Glucose  reference range applies only to samples taken after fasting for at least 8 hours.   BUN 7 (L) 8 - 23 mg/dL   Creatinine, Ser 0.73 0.61 - 1.24 mg/dL   Calcium 9.1 8.9 - 10.3 mg/dL   Total Protein 7.0 6.5 - 8.1 g/dL   Albumin 3.8 3.5 - 5.0 g/dL  AST 27 15 - 41 U/L   ALT 47 (H) 0 - 44 U/L   Alkaline Phosphatase 47 38 - 126 U/L   Total Bilirubin 0.6 0.3 - 1.2 mg/dL   GFR, Estimated >60 >60 mL/min    Comment: (NOTE) Calculated using the CKD-EPI Creatinine Equation (2021)    Anion gap 9 5 - 15    Comment: Performed at [place] [address] [place], [place] [address]  CBC with Differential     Status: Abnormal   Collection Time: 06/15/20  2:39 PM  Result Value Ref Range   WBC 15.4 (H) 4.0 - 10.5 K/uL   RBC 4.94 4.22 - 5.81 MIL/uL   Hemoglobin 15.2 13.0 - 17.0 g/dL   HCT 44.2 39.0 - 52.0 %   MCV 89.5 80.0 - 100.0 fL   MCH 30.8 26.0 - 34.0 pg   MCHC 34.4 30.0 - 36.0 g/dL   RDW 13.8 11.5 - 15.5 %   Platelets 375 150 - 400 K/uL   nRBC 0.0 0.0 - 0.2 %   Neutrophils Relative % 72 %   Neutro Abs 11.3 (H) 1.7 - 7.7 K/uL   Lymphocytes Relative 16 %   Lymphs Abs 2.4 0.7 - 4.0 K/uL   Monocytes Relative 8 %   Monocytes Absolute 1.2 (H) 0.1 - 1.0 K/uL   Eosinophils Relative 2 %   Eosinophils Absolute 0.2 0.0 - 0.5 K/uL   Basophils Relative 1 %   Basophils Absolute 0.1 0.0 - 0.1 K/uL   Immature Granulocytes 1 %   Abs Immature Granulocytes 0.08 (H) 0.00 - 0.07 K/uL    Comment: Performed at [facility] [address], [place], [place] [address]  Brain natriuretic peptide     Status: None   Collection Time: 06/15/20  2:39 PM  Result Value Ref Range   B Natriuretic Peptide 22.2 0.0 - 100.0 pg/mL    Comment: Performed at [facility] [address], [place], [place] [address]     Psychiatric Specialty Exam: Physical Exam  Review of Systems  Weight 160 lb (72.6 kg).There is no height or weight on file to calculate BMI.  General Appearance: NA  Eye Contact:  NA   Speech:  Slow  Volume:  Decreased  Mood:  Euthymic  Affect:  NA  Thought Process:  Descriptions of Associations: Loose  Orientation:  Full (Time, Place, and Person)  Thought Content:  Hallucinations: talk to himself  Suicidal Thoughts:  No  Homicidal Thoughts:  No  Memory:  Immediate;   Fair Recent;   Poor Remote;   Poor  Judgement:  Fair  Insight:  Shallow  Psychomotor Activity:  NA  Concentration:  Concentration: Fair and Attention Span: Fair  Recall:  Poor  Fund of Knowledge:  Fair  Language:  Fair  Akathisia:  No  Handed:  Right  AIMS (tremors):     Assets:  Desire for Improvement Housing Social Support  ADL's:  Impaired  Cognition:  Impaired,  Mild  Sleep:   ok      Assessment and Plan: Schizophrenia chronic paranoid type.  Insomnia due to mental disorder.  Discussed recent blood work results.  He is psychiatrically stable.  However occasionally complain of constipation.  I recommend he can try cutting down the benztropine to take only at bedtime however if the tremors continue to get worse then he can take twice a day.  I will continue mirtazapine 30 mg at bedtime and Risperdal 3 mg  twice a day.  Patient sister does not want to cut down the Risperdal as his psychosis is manageable and under control.  Recommended to call us back if is any question or any concern.  Follow-up in 3 months.  Follow Up Instructions:    I discussed the assessment and treatment plan with the patient. The patient was provided an opportunity to ask questions and all were answered. The patient agreed with the plan and demonstrated an understanding of the instructions.   The patient was advised to call back or seek an in-person evaluation if the symptoms worsen or if the condition fails to improve as anticipated.  I provided 17 minutes of non-face-to-face time during this encounter.   Kathlee Nations, MD

## 2020-07-26 ENCOUNTER — Other Ambulatory Visit (HOSPITAL_COMMUNITY): Payer: Self-pay

## 2020-07-26 DIAGNOSIS — F200 Paranoid schizophrenia: Secondary | ICD-10-CM

## 2020-07-26 MED ORDER — BENZTROPINE MESYLATE 2 MG PO TABS: 2.0000 mg | ORAL_TABLET | Freq: Two times a day (BID) | ORAL | 0 refills | Status: DC | PRN

## 2020-08-02 ENCOUNTER — Other Ambulatory Visit: Payer: Self-pay

## 2020-08-02 ENCOUNTER — Emergency Department (HOSPITAL_COMMUNITY): Payer: Medicare Other

## 2020-08-02 ENCOUNTER — Encounter (HOSPITAL_COMMUNITY): Payer: Self-pay | Admitting: Internal Medicine

## 2020-08-02 ENCOUNTER — Inpatient Hospital Stay (HOSPITAL_COMMUNITY)
Admission: EM | Admit: 2020-08-02 | Discharge: 2020-08-04 | DRG: 191 | Disposition: A | Discharge status: Home / Self Care | Payer: Medicare Other | Attending: Internal Medicine | Admitting: Internal Medicine

## 2020-08-02 DIAGNOSIS — I10 Essential (primary) hypertension: Secondary | ICD-10-CM | POA: Diagnosis present

## 2020-08-02 DIAGNOSIS — R0689 Other abnormalities of breathing: Secondary | ICD-10-CM | POA: Diagnosis not present

## 2020-08-02 DIAGNOSIS — F17210 Nicotine dependence, cigarettes, uncomplicated: Secondary | ICD-10-CM | POA: Diagnosis not present

## 2020-08-02 DIAGNOSIS — Z20822 Contact with and (suspected) exposure to covid-19: Secondary | ICD-10-CM | POA: Diagnosis present

## 2020-08-02 DIAGNOSIS — J441 Chronic obstructive pulmonary disease with (acute) exacerbation: Secondary | ICD-10-CM | POA: Diagnosis not present

## 2020-08-02 DIAGNOSIS — R0602 Shortness of breath: Secondary | ICD-10-CM | POA: Diagnosis not present

## 2020-08-02 DIAGNOSIS — J449 Chronic obstructive pulmonary disease, unspecified: Secondary | ICD-10-CM | POA: Diagnosis present

## 2020-08-02 DIAGNOSIS — E785 Hyperlipidemia, unspecified: Secondary | ICD-10-CM | POA: Diagnosis present

## 2020-08-02 DIAGNOSIS — J439 Emphysema, unspecified: Principal | ICD-10-CM | POA: Diagnosis present

## 2020-08-02 DIAGNOSIS — J9611 Chronic respiratory failure with hypoxia: Secondary | ICD-10-CM | POA: Diagnosis present

## 2020-08-02 DIAGNOSIS — R000 Tachycardia, unspecified: Secondary | ICD-10-CM | POA: Diagnosis not present

## 2020-08-02 DIAGNOSIS — Z8249 Family history of ischemic heart disease and other diseases of the circulatory system: Secondary | ICD-10-CM

## 2020-08-02 DIAGNOSIS — R231 Pallor: Secondary | ICD-10-CM | POA: Diagnosis not present

## 2020-08-02 DIAGNOSIS — Z833 Family history of diabetes mellitus: Secondary | ICD-10-CM

## 2020-08-02 DIAGNOSIS — Z7952 Long term (current) use of systemic steroids: Secondary | ICD-10-CM

## 2020-08-02 DIAGNOSIS — Z7951 Long term (current) use of inhaled steroids: Secondary | ICD-10-CM

## 2020-08-02 DIAGNOSIS — F209 Schizophrenia, unspecified: Secondary | ICD-10-CM | POA: Diagnosis present

## 2020-08-02 DIAGNOSIS — Z79899 Other long term (current) drug therapy: Secondary | ICD-10-CM

## 2020-08-02 DIAGNOSIS — R062 Wheezing: Secondary | ICD-10-CM | POA: Diagnosis not present

## 2020-08-02 DIAGNOSIS — Z66 Do not resuscitate: Secondary | ICD-10-CM | POA: Diagnosis not present

## 2020-08-02 DIAGNOSIS — Z823 Family history of stroke: Secondary | ICD-10-CM

## 2020-08-02 DIAGNOSIS — R0600 Dyspnea, unspecified: Secondary | ICD-10-CM | POA: Diagnosis not present

## 2020-08-02 DIAGNOSIS — Z9049 Acquired absence of other specified parts of digestive tract: Secondary | ICD-10-CM

## 2020-08-02 DIAGNOSIS — Z9981 Dependence on supplemental oxygen: Secondary | ICD-10-CM

## 2020-08-02 LAB — CBC
HCT: 42 % (ref 39.0–52.0)
HCT: 45.1 % (ref 39.0–52.0)
Hemoglobin: 14.1 g/dL (ref 13.0–17.0)
Hemoglobin: 15 g/dL (ref 13.0–17.0)
MCH: 30.6 pg (ref 26.0–34.0)
MCH: 30.2 pg (ref 26.0–34.0)
MCHC: 33.6 g/dL (ref 30.0–36.0)
MCHC: 33.3 g/dL (ref 30.0–36.0)
MCV: 91.1 fL (ref 80.0–100.0)
MCV: 90.9 fL (ref 80.0–100.0)
Platelets: 294 10*3/uL (ref 150–400)
Platelets: 274 10*3/uL (ref 150–400)
RBC: 4.61 MIL/uL (ref 4.22–5.81)
RBC: 4.96 MIL/uL (ref 4.22–5.81)
RDW: 12.9 % (ref 11.5–15.5)
RDW: 12.8 % (ref 11.5–15.5)
WBC: 8.7 10*3/uL (ref 4.0–10.5)
WBC: 5.8 10*3/uL (ref 4.0–10.5)
nRBC: 0 % (ref 0.0–0.2)
nRBC: 0 % (ref 0.0–0.2)

## 2020-08-02 LAB — POC SARS CORONAVIRUS 2 AG -  ED: SARSCOV2ONAVIRUS 2 AG: NEGATIVE

## 2020-08-02 LAB — COMPREHENSIVE METABOLIC PANEL
ALT: 24 U/L (ref 0–44)
AST: 23 U/L (ref 15–41)
Albumin: 3.6 g/dL (ref 3.5–5.0)
Alkaline Phosphatase: 42 U/L (ref 38–126)
Anion gap: 8 (ref 5–15)
BUN: 5 mg/dL — ABNORMAL LOW (ref 8–23)
CO2: 25 mmol/L (ref 22–32)
Calcium: 8.9 mg/dL (ref 8.9–10.3)
Chloride: 102 mmol/L (ref 98–111)
Creatinine, Ser: 0.83 mg/dL (ref 0.61–1.24)
GFR, Estimated: >60 mL/min (ref >60)
Glucose, Bld: 112 mg/dL — ABNORMAL HIGH (ref 70–99)
Potassium: 4.1 mmol/L (ref 3.5–5.1)
Sodium: 135 mmol/L (ref 135–145)
Total Bilirubin: 0.5 mg/dL (ref 0.3–1.2)
Total Protein: 6.6 g/dL (ref 6.5–8.1)

## 2020-08-02 LAB — I-STAT ARTERIAL BLOOD GAS, ED
Acid-Base Excess: 1 mmol/L (ref 0.0–2.0)
Bicarbonate: 25.3 mmol/L (ref 20.0–28.0)
Calcium, Ion: 1.2 mmol/L (ref 1.15–1.40)
HCT: 40 % (ref 39.0–52.0)
Hemoglobin: 13.6 g/dL (ref 13.0–17.0)
O2 Saturation: 98 %
Patient temperature: 98.1
Potassium: 3.8 mmol/L (ref 3.5–5.1)
Sodium: 136 mmol/L (ref 135–145)
TCO2: 26 mmol/L (ref 22–32)
pCO2 arterial: 38.1 mmHg (ref 32.0–48.0)
pH, Arterial: 7.428 (ref 7.350–7.450)
pO2, Arterial: 108 mmHg (ref 83.0–108.0)

## 2020-08-02 LAB — CREATININE, SERUM
Creatinine, Ser: 0.92 mg/dL (ref 0.61–1.24)
GFR, Estimated: >60 mL/min (ref >60)

## 2020-08-02 LAB — RESP PANEL BY RT-PCR (FLU A&B, COVID) ARPGX2
Influenza A by PCR: NEGATIVE
Influenza B by PCR: NEGATIVE
SARS Coronavirus 2 by RT PCR: NEGATIVE

## 2020-08-02 LAB — BRAIN NATRIURETIC PEPTIDE: B Natriuretic Peptide: 11.4 pg/mL (ref 0.0–100.0)

## 2020-08-02 LAB — HIV ANTIBODY (ROUTINE TESTING W REFLEX): HIV Screen 4th Generation wRfx: NONREACTIVE

## 2020-08-02 MED ORDER — DOXYCYCLINE HYCLATE 100 MG PO TABS
100.0000 mg | ORAL_TABLET | Freq: Two times a day (BID) | ORAL | Status: DC
  Administered 2020-08-02 – 2020-08-04 (×4): 100 mg via ORAL
  Filled 2020-08-02 (×4): qty 1

## 2020-08-02 MED ORDER — CARVEDILOL 3.125 MG PO TABS
3.1250 mg | ORAL_TABLET | Freq: Every morning | ORAL | Status: DC
  Administered 2020-08-03 – 2020-08-04 (×2): 3.125 mg via ORAL
  Filled 2020-08-02 (×2): qty 1

## 2020-08-02 MED ORDER — ENALAPRIL MALEATE 5 MG PO TABS
10.0000 mg | ORAL_TABLET | Freq: Every day | ORAL | Status: DC
  Administered 2020-08-03 – 2020-08-04 (×2): 10 mg via ORAL
  Filled 2020-08-02 (×2): qty 2

## 2020-08-02 MED ORDER — ENOXAPARIN SODIUM 40 MG/0.4ML IJ SOSY
40.0000 mg | PREFILLED_SYRINGE | INTRAMUSCULAR | Status: DC
  Administered 2020-08-02 – 2020-08-03 (×2): 40 mg via SUBCUTANEOUS
  Filled 2020-08-02 (×2): qty 0.4

## 2020-08-02 MED ORDER — FAMOTIDINE 20 MG PO TABS
40.0000 mg | ORAL_TABLET | Freq: Two times a day (BID) | ORAL | Status: DC
  Administered 2020-08-02 – 2020-08-04 (×4): 40 mg via ORAL
  Filled 2020-08-02 (×4): qty 2

## 2020-08-02 MED ORDER — ALBUTEROL SULFATE (2.5 MG/3ML) 0.083% IN NEBU
2.5000 mg | INHALATION_SOLUTION | RESPIRATORY_TRACT | Status: DC | PRN
  Administered 2020-08-03 – 2020-08-04 (×2): 2.5 mg via RESPIRATORY_TRACT
  Filled 2020-08-02 (×3): qty 3

## 2020-08-02 MED ORDER — RISPERIDONE 0.5 MG PO TABS
3.0000 mg | ORAL_TABLET | Freq: Two times a day (BID) | ORAL | Status: DC
  Administered 2020-08-02 – 2020-08-04 (×4): 3 mg via ORAL
  Filled 2020-08-02: qty 1
  Filled 2020-08-02: qty 6
  Filled 2020-08-02: qty 1
  Filled 2020-08-02 (×2): qty 6

## 2020-08-02 MED ORDER — ATORVASTATIN CALCIUM 10 MG PO TABS
10.0000 mg | ORAL_TABLET | Freq: Every day | ORAL | Status: DC
  Administered 2020-08-03: 10 mg via ORAL
  Filled 2020-08-02: qty 1

## 2020-08-02 MED ORDER — BENZTROPINE MESYLATE 1 MG PO TABS
2.0000 mg | ORAL_TABLET | Freq: Two times a day (BID) | ORAL | Status: DC | PRN
  Filled 2020-08-02: qty 1

## 2020-08-02 MED ORDER — ACETAMINOPHEN 650 MG RE SUPP: 650.0000 mg | Freq: Four times a day (QID) | RECTAL | Status: DC | PRN

## 2020-08-02 MED ORDER — BUDESONIDE 0.25 MG/2ML IN SUSP
0.2500 mg | Freq: Two times a day (BID) | RESPIRATORY_TRACT | Status: DC
  Administered 2020-08-03 – 2020-08-04 (×2): 0.25 mg via RESPIRATORY_TRACT
  Filled 2020-08-02 (×6): qty 2

## 2020-08-02 MED ORDER — MIRTAZAPINE 15 MG PO TABS
30.0000 mg | ORAL_TABLET | Freq: Every day | ORAL | Status: DC
  Administered 2020-08-02 – 2020-08-03 (×2): 30 mg via ORAL
  Filled 2020-08-02: qty 1
  Filled 2020-08-02: qty 2
  Filled 2020-08-02: qty 1

## 2020-08-02 MED ORDER — ACETAMINOPHEN 325 MG PO TABS: 650.0000 mg | ORAL_TABLET | Freq: Four times a day (QID) | ORAL | Status: DC | PRN

## 2020-08-02 MED ORDER — POLYETHYLENE GLYCOL 3350 17 G PO PACK
17.0000 g | PACK | Freq: Every day | ORAL | Status: DC | PRN
  Administered 2020-08-04: 17 g via ORAL
  Filled 2020-08-02: qty 1

## 2020-08-02 MED ORDER — IPRATROPIUM BROMIDE 0.02 % IN SOLN
0.5000 mg | RESPIRATORY_TRACT | Status: DC
  Administered 2020-08-02 – 2020-08-03 (×4): 0.5 mg via RESPIRATORY_TRACT
  Filled 2020-08-02 (×4): qty 2.5

## 2020-08-02 MED ORDER — METHYLPREDNISOLONE SODIUM SUCC 40 MG IJ SOLR
40.0000 mg | Freq: Two times a day (BID) | INTRAMUSCULAR | Status: DC
  Administered 2020-08-03 – 2020-08-04 (×3): 40 mg via INTRAVENOUS
  Filled 2020-08-02 (×4): qty 1

## 2020-08-02 MED ORDER — ALBUTEROL (5 MG/ML) CONTINUOUS INHALATION SOLN
10.0000 mg/h | INHALATION_SOLUTION | Freq: Once | RESPIRATORY_TRACT | Status: AC
  Administered 2020-08-02: 10 mg/h via RESPIRATORY_TRACT
  Filled 2020-08-02: qty 20

## 2020-08-02 MED ORDER — ALBUTEROL SULFATE HFA 108 (90 BASE) MCG/ACT IN AERS
4.0000 | INHALATION_SPRAY | Freq: Once | RESPIRATORY_TRACT | Status: AC
  Administered 2020-08-02: 4 via RESPIRATORY_TRACT
  Filled 2020-08-02: qty 6.7

## 2020-08-02 MED ORDER — METHYLPREDNISOLONE SODIUM SUCC 125 MG IJ SOLR
125.0000 mg | Freq: Once | INTRAMUSCULAR | Status: AC
  Administered 2020-08-02: 125 mg via INTRAVENOUS
  Filled 2020-08-02: qty 2

## 2020-08-02 MED ORDER — ALBUTEROL SULFATE (2.5 MG/3ML) 0.083% IN NEBU
2.5000 mg | INHALATION_SOLUTION | RESPIRATORY_TRACT | Status: DC
  Administered 2020-08-02 – 2020-08-03 (×4): 2.5 mg via RESPIRATORY_TRACT
  Filled 2020-08-02 (×4): qty 3

## 2020-08-02 MED ORDER — TAMSULOSIN HCL 0.4 MG PO CAPS
0.4000 mg | ORAL_CAPSULE | Freq: Every day | ORAL | Status: DC
  Administered 2020-08-02 – 2020-08-03 (×2): 0.4 mg via ORAL
  Filled 2020-08-02 (×2): qty 1

## 2020-08-02 NOTE — ED Notes (Signed)
The patient's sister requested that the provider would call her in the morning during rounds for an update on the patient's condition. Contact information is within the patient's contact information.

## 2020-08-02 NOTE — H&P (Signed)
History and Physical    Blake Mcknight [id number] DOB: May 01, 1955 DOA: 08/02/2020  PCP: [name], MD   Patient coming from: Home.  Chief Complaint: Shortness of breath.  HPI: Blake Mcknight is a 65 y.o. male with history of COPD, schizophrenia, hyperlipidemia, hypertension and presents with worsening shortness of breath over the last 48 hours.  Has been having some productive cough denies fever chills or chest pain.  ED Course: In the ER patient is found to be diffusely wheezing.  Chest x-ray does not show any acute EKG shows normal sinus rhythm.  Labs are largely at baseline.  COVID test was negative.  Despite nebulizer treatment patient was still wheezing patient admitted for further observation for COPD exacerbation.  Patient also was mildly hypoxic.  Review of Systems: As per HPI, rest all negative.   Past Medical History:  Diagnosis Date  . COPD (chronic obstructive pulmonary disease) (HCC)    with bullous emphysema.   [place] Heavy cigarette smoker before 2003   pt claims only 10 cigs per day, never heavier amounts.   [place] HLD (hyperlipidemia)   . Hypertension   . Schizophrenia ([place]) 06/28/2013   This is a chronic condition and he lives with family    Past Surgical History:  Procedure Laterality Date  . CHOLECYSTECTOMY N/A 06/06/2013   Procedure: LAPAROSCOPIC CHOLECYSTECTOMY WITH ATTEMPTED INTRAOPERATIVE CHOLANGIOGRAM;  Surgeon: Zenovia Jarred, MD;  Location: [facility];  Service: General;  Laterality: N/A;  . ERCP N/A 06/03/2013   Procedure: ENDOSCOPIC RETROGRADE CHOLANGIOPANCREATOGRAPHY (ERCP);  Surgeon: Ladene Artist, MD;  Location: [facility] ENDOSCOPY;  Service: Endoscopy;  Laterality: N/A;  . NO PAST SURGERIES       reports that he has been smoking cigarettes. He has a 41.00 pack-year smoking history. He has never used smokeless tobacco. He reports that he does not drink alcohol and does not use drugs.  No Known Allergies  Family History  Problem Relation Age  of Onset  . Diabetes Mellitus II Mother   . Diabetes Mother   . Heart disease Father   . Stroke Maternal Aunt   . CAD Neg Hx     Prior to Admission medications   Medication Sig Start Date End Date Taking? Authorizing Provider  albuterol (PROVENTIL) (2.5 MG/3ML) 0.083% nebulizer solution Take 3 mLs (2.5 mg total) by nebulization every 3 (three) hours as needed for wheezing or shortness of breath. 02/20/19  Yes Aline August, MD  albuterol (VENTOLIN HFA) 108 (90 Base) MCG/ACT inhaler Inhale 2 puffs into the lungs every 4 (four) hours as needed for wheezing or shortness of breath. 02/20/19  Yes Aline August, MD  Ascorbic Acid (VITAMIN C PO) Take 1 tablet by mouth 3 (three) times a week.    Yes [provider]  atorvastatin (LIPITOR) 10 MG tablet Take 10 mg by mouth daily at 6 PM.  09/21/15  Yes [provider]  benztropine (COGENTIN) 2 MG tablet Take 1 tablet (2 mg total) by mouth 2 (two) times daily as needed for tremors. 07/26/20  Yes Arfeen, Arlyce Harman, MD  BREZTRI AEROSPHERE 160-9-4.8 MCG/ACT AERO Inhale 2 puffs into the lungs in the morning and at bedtime. 07/18/20  Yes [provider]  carvedilol (COREG) 3.125 MG tablet Take 1 tablet (3.125 mg total) by mouth 2 (two) times daily with a meal. Patient taking differently: Take 3.125 mg by mouth every morning. 02/20/19  Yes Aline August, MD  cetirizine (ZYRTEC) 10 MG tablet Take 10 mg by mouth daily  as needed for allergies or rhinitis.   Yes [provider]  cholecalciferol (VITAMIN D) 25 MCG (1000 UNIT) tablet Take 4,000 Units by mouth every evening.    Yes [provider]  Cyanocobalamin (B-12 PO) Take 1 tablet by mouth every other day.   Yes [provider]  enalapril (VASOTEC) 10 MG tablet Take 10 mg by mouth daily. 06/04/16  Yes [provider]  famotidine (PEPCID) 40 MG tablet Take 40 mg by mouth 2 (two) times daily. 12/28/18  Yes [provider]  mirtazapine (REMERON) 30 MG  tablet Take 1 tablet (30 mg total) by mouth at bedtime. 07/25/20 07/25/21 Yes Arfeen, Arlyce Harman, MD  multivitamin (ONE-A-DAY MEN'S) TABS tablet Take 1 tablet by mouth daily.   Yes [provider]  OXYGEN Inhale 2 L/min into the lungs continuous.    Yes [provider]  polyethylene glycol (MIRALAX / GLYCOLAX) 17 g packet Take 17 g by mouth daily as needed for mild constipation.   Yes [provider]  predniSONE (DELTASONE) 10 MG tablet Take 10 mg by mouth 2 (two) times daily. 06/01/20  Yes [provider]  risperiDONE (RISPERDAL) 3 MG tablet Take 1 tablet (3 mg total) by mouth 2 (two) times daily. 07/25/20  Yes Arfeen, Arlyce Harman, MD  sodium chloride (OCEAN) 0.65 % SOLN nasal spray Place 1 spray into both nostrils as needed for congestion.   Yes [provider]  tamsulosin (FLOMAX) 0.4 MG CAPS capsule Take 0.4 mg by mouth at bedtime. 07/23/20  Yes [provider]  amoxicillin-clavulanate (AUGMENTIN) 875-125 MG tablet Take 1 tablet by mouth 2 (two) times daily. One po bid x 7 days Patient not taking: No sig reported 06/16/20   Ripley Fraise, MD  doxycycline (VIBRAMYCIN) 100 MG capsule Take 1 capsule (100 mg total) by mouth 2 (two) times daily. One po bid x 7 days Patient not taking: No sig reported 06/16/20   Ripley Fraise, MD  Fluticasone-Salmeterol (ADVAIR DISKUS) 250-50 MCG/DOSE AEPB Inhale 1 puff into the lungs 2 (two) times daily. 06/23/19 09/21/19  Harvie Heck, MD  guaiFENesin-dextromethorphan (ROBITUSSIN DM) 100-10 MG/5ML syrup Take 10 mLs by mouth every 4 (four) hours as needed for cough. Patient not taking: Reported on 08/02/2020 02/20/19   Aline August, MD  montelukast (SINGULAIR) 10 MG tablet Take 1 tablet (10 mg total) by mouth daily. 06/23/19 09/21/19  Harvie Heck, MD  nicotine (NICODERM CQ - DOSED IN MG/24 HOURS) 14 mg/24hr patch Place 1 patch (14 mg total) onto the skin daily. Patient not taking: Reported on 08/02/2020 02/20/19   Aline August, MD   omeprazole (PRILOSEC) 20 MG capsule Take 1 capsule (20 mg total) by mouth daily. Patient not taking: No sig reported 03/14/19   Domenic Moras, PA-C    Physical Exam: Constitutional: Moderately built and nourished. Vitals:   08/02/20 1700 08/02/20 1715 08/02/20 1730 08/02/20 1845  BP: (!) 142/90 135/84 (!) 130/94 127/83  Pulse: 88 (!) 108 91 95  Resp: 15 (!) 28 19 15   Temp:      TempSrc:      SpO2: 100% 100% 100% 92%   Eyes: Anicteric no pallor. ENMT: No discharge from the ears eyes nose or mouth. Neck: No mass felt.  No neck rigidity. Respiratory: Bilateral expiratory wheeze and no crepitations. Cardiovascular: S1-S2 heard. Abdomen: Soft nontender bowel sounds present. Musculoskeletal: No edema. Skin: No rash. Neurologic: Alert awake oriented to time place and person.  Moves all extremities. Psychiatric: Appears normal.  Normal affect.  Labs on Admission: I have personally reviewed following labs and imaging studies  CBC: Recent Labs  Lab 08/02/20 1410 08/02/20 1415  WBC 8.7  --   HGB 14.1 13.6  HCT 42.0 40.0  MCV 91.1  --   PLT 294  --    Basic Metabolic Panel: Recent Labs  Lab 08/02/20 1410 08/02/20 1415  NA 135 136  K 4.1 3.8  CL 102  --   CO2 25  --   GLUCOSE 112*  --   BUN 5*  --   CREATININE 0.83  --   CALCIUM 8.9  --    GFR: CrCl cannot be calculated (Unknown ideal weight.). Liver Function Tests: Recent Labs  Lab 08/02/20 1410  AST 23  ALT 24  ALKPHOS 42  BILITOT 0.5  PROT 6.6  ALBUMIN 3.6   No results for input(s): LIPASE, AMYLASE in the last 168 hours. No results for input(s): AMMONIA in the last 168 hours. Coagulation Profile: No results for input(s): INR, PROTIME in the last 168 hours. Cardiac Enzymes: No results for input(s): CKTOTAL, CKMB, CKMBINDEX, TROPONINI in the last 168 hours. BNP (last 3 results) No results for input(s): PROBNP in the last 8760 hours. HbA1C: No results for input(s): HGBA1C in the last 72 hours. CBG: No  results for input(s): GLUCAP in the last 168 hours. Lipid Profile: No results for input(s): CHOL, HDL, LDLCALC, TRIG, CHOLHDL, LDLDIRECT in the last 72 hours. Thyroid Function Tests: No results for input(s): TSH, T4TOTAL, FREET4, T3FREE, THYROIDAB in the last 72 hours. Anemia Panel: No results for input(s): VITAMINB12, FOLATE, FERRITIN, TIBC, IRON, RETICCTPCT in the last 72 hours. Urine analysis:    Component Value Date/Time   COLORURINE STRAW (A) 06/15/2020 1434   APPEARANCEUR CLEAR 06/15/2020 1434   LABSPEC 1.005 06/15/2020 1434   PHURINE 7.0 06/15/2020 1434   GLUCOSEU NEGATIVE 06/15/2020 1434   HGBUR NEGATIVE 06/15/2020 1434   BILIRUBINUR NEGATIVE 06/15/2020 1434   KETONESUR NEGATIVE 06/15/2020 1434   PROTEINUR NEGATIVE 06/15/2020 1434   UROBILINOGEN 0.2 01/29/2019 1634   NITRITE NEGATIVE 06/15/2020 1434   LEUKOCYTESUR NEGATIVE 06/15/2020 1434   Sepsis Labs: @LABRCNTIP (procalcitonin:4,lacticidven:4) ) Recent Results (from the past 240 hour(s))  Resp Panel by RT-PCR (Flu A&B, Covid) Nasopharyngeal Swab     Status: None   Collection Time: 08/02/20  5:38 PM   Specimen: Nasopharyngeal Swab; Nasopharyngeal(NP) swabs in vial transport medium  Result Value Ref Range Status   SARS Coronavirus 2 by RT PCR NEGATIVE NEGATIVE Final    Comment: (NOTE) SARS-CoV-2 target nucleic acids are NOT DETECTED.  The SARS-CoV-2 RNA is generally detectable in upper respiratory specimens during the acute phase of infection. The lowest concentration of SARS-CoV-2 viral copies this assay can detect is 138 copies/mL. A negative result does not preclude SARS-Cov-2 infection and should not be used as the sole basis for treatment or other patient management decisions. A negative result may occur with  improper specimen collection/handling, submission of specimen other than nasopharyngeal swab, presence of viral mutation(s) within the areas targeted by this assay, and inadequate number of  viral copies(<138 copies/mL). A negative result must be combined with clinical observations, patient history, and epidemiological information. The expected result is Negative.  Fact Sheet for Patients:  EntrepreneurPulse.com.au  Fact Sheet for Healthcare Providers:  IncredibleEmployment.be  This test is no t yet approved or cleared by the [place] FDA and  has been authorized for detection and/or diagnosis of SARS-CoV-2 by FDA under an Emergency Use Authorization (EUA). This EUA  will remain  in effect (meaning this test can be used) for the duration of the COVID-19 declaration under Section 564(b)(1) of the Act, 21 U.S.C.section 360bbb-3(b)(1), unless the authorization is terminated  or revoked sooner.       Influenza A by PCR NEGATIVE NEGATIVE Final   Influenza B by PCR NEGATIVE NEGATIVE Final    Comment: (NOTE) The Xpert Xpress SARS-CoV-2/FLU/RSV plus assay is intended as an aid in the diagnosis of influenza from Nasopharyngeal swab specimens and should not be used as a sole basis for treatment. Nasal washings and aspirates are unacceptable for Xpert Xpress SARS-CoV-2/FLU/RSV testing.  Fact Sheet for Patients: EntrepreneurPulse.com.au  Fact Sheet for Healthcare Providers: IncredibleEmployment.be  This test is not yet approved or cleared by the [place] FDA and has been authorized for detection and/or diagnosis of SARS-CoV-2 by FDA under an Emergency Use Authorization (EUA). This EUA will remain in effect (meaning this test can be used) for the duration of the COVID-19 declaration under Section 564(b)(1) of the Act, 21 U.S.C. section 360bbb-3(b)(1), unless the authorization is terminated or revoked.  Performed at [facility], [place] [address], [place], [place] [address]      Radiological Exams on Admission: DG Chest Port 1 View  Result Date: 08/02/2020 CLINICAL DATA:  dyspnea,  increased difficulty breathing at home EXAM: PORTABLE CHEST 1 VIEW COMPARISON:  Radiograph 06/15/2020, chest CT 06/20/2019 FINDINGS: Unchanged cardiomediastinal silhouette. There is severe, advanced emphysema with large bullae bilaterally. Persistent lower lung airspace opacities bilaterally, likely combination of scarring and or atelectasis. There is no large pleural effusion or visible pneumothorax. IMPRESSION: Advanced bullous emphysematous disease. Persistent lower lung opacities, likely combination of scarring and or atelectasis. No definite new focal airspace disease. Electronically Signed   By: Maurine Simmering   On: 08/02/2020 15:06    EKG: Independently reviewed.  Normal sinus rhythm.  Assessment/Plan Principal Problem:   Chronic obstructive pulmonary disease with acute exacerbation (HCC) Active Problems:   HTN (hypertension)   HLD (hyperlipidemia)   Schizophrenia (HCC)   COPD exacerbation ([place])    1. COPD exacerbation for which patient is on IV Solu-Medrol nebulizer treatment and Pulmicort and doxycycline. 2. Hypertension on enalapril and Coreg. 3. Hyperlipidemia on statins. 4. History of schizophrenia on Cogentin and Risperdal.   DVT prophylaxis: Lovenox. Code Status: Full code. Family Communication: Discussed with patient. Disposition Plan: Home. Consults called: None. Admission status: Observation.   Rise Patience MD [facility] Pager [phone].  If 7PM-7AM, please contact night-coverage [email or web address] Password [facility]  08/02/2020, 7:51 PM

## 2020-08-02 NOTE — ED Provider Notes (Signed)
[facility] EMERGENCY DEPARTMENT Provider Note   CSN: [id number] Arrival date & time: 08/02/20  1359     History Chief Complaint  Patient presents with  . Shortness of Breath    Blake Mcknight is a 65 y.o. male.  HPI   Patient presents to the ED for evaluation of shortness of breath.  Patient is a cigarette smoker and has history of COPD.  Wife called EMS today because of increasing difficulty breathing at home.  When EMS arrived the patient was pale diaphoretic and had labored breathing.  Oxygen saturation was 86% on room air on arrival.  EMS placed him on CPAP and he was given a DuoNeb.  Patient had some improvement on route but however on arrival to the ED the patient did not want to continue the CPAP or BiPAP.  He states he does not need the mask and it does not help.  Past Medical History:  Diagnosis Date  . COPD (chronic obstructive pulmonary disease) (HCC)    with bullous emphysema.   Marland Kitchen Heavy cigarette smoker before 2003   pt claims only 10 cigs per day, never heavier amounts.   Marland Kitchen HLD (hyperlipidemia)   . Hypertension   . Schizophrenia ([place]) 06/28/2013   This is a chronic condition and he lives with family    Patient Active Problem List   Diagnosis Date Noted  . Hypoxia   . COPD exacerbation ([place]) 06/05/2019  . Do not resuscitate 02/18/2019  . Bullous emphysema ([place]) 07/26/2013  . Schizophrenia ([place]) 06/28/2013  . Tobacco use disorder 06/06/2013  . Abdominal pain 06/03/2013  . HTN (hypertension) 06/03/2013  . HLD (hyperlipidemia) 06/03/2013  . Chronic obstructive pulmonary disease with acute exacerbation ([place]) 06/03/2013  . Calculus of bile duct without mention of cholecystitis or obstruction 06/03/2013    Past Surgical History:  Procedure Laterality Date  . CHOLECYSTECTOMY N/A 06/06/2013   Procedure: LAPAROSCOPIC CHOLECYSTECTOMY WITH ATTEMPTED INTRAOPERATIVE CHOLANGIOGRAM;  Surgeon: Zenovia Jarred, MD;  Location: [facility];  Service:  General;  Laterality: N/A;  . ERCP N/A 06/03/2013   Procedure: ENDOSCOPIC RETROGRADE CHOLANGIOPANCREATOGRAPHY (ERCP);  Surgeon: Ladene Artist, MD;  Location: [facility] ENDOSCOPY;  Service: Endoscopy;  Laterality: N/A;  . NO PAST SURGERIES         Family History  Problem Relation Age of Onset  . Diabetes Mellitus II Mother   . Diabetes Mother   . Heart disease Father   . Stroke Maternal Aunt   . CAD Neg Hx     Social History   Tobacco Use  . Smoking status: Current Every Day Smoker    Packs/day: 1.00    Years: 41.00    Pack years: 41.00    Types: Cigarettes  . Smokeless tobacco: Never Used  . Tobacco comment: Gets 4 puffs a day.   Vaping Use  . Vaping Use: Never used  Substance Use Topics  . Alcohol use: No  . Drug use: No    Home Medications Prior to Admission medications   Medication Sig Start Date End Date Taking? Authorizing Provider  albuterol (PROVENTIL) (2.5 MG/3ML) 0.083% nebulizer solution Take 3 mLs (2.5 mg total) by nebulization every 3 (three) hours as needed for wheezing or shortness of breath. 02/20/19   Aline August, MD  albuterol (VENTOLIN HFA) 108 (90 Base) MCG/ACT inhaler Inhale 2 puffs into the lungs every 4 (four) hours as needed for wheezing or shortness of breath. 02/20/19   Aline August, MD  amoxicillin-clavulanate (AUGMENTIN) 875-125 MG tablet  Take 1 tablet by mouth 2 (two) times daily. One po bid x 7 days 06/16/20   Ripley Fraise, MD  Ascorbic Acid (VITAMIN C PO) Take 1 tablet by mouth 3 (three) times a week.     [provider]  atorvastatin (LIPITOR) 10 MG tablet Take 10 mg by mouth daily at 6 PM.  09/21/15   [provider]  benztropine (COGENTIN) 2 MG tablet Take 1 tablet (2 mg total) by mouth 2 (two) times daily as needed for tremors. 07/26/20   Arfeen, Arlyce Harman, MD  carvedilol (COREG) 3.125 MG tablet Take 1 tablet (3.125 mg total) by mouth 2 (two) times daily with a meal. Patient taking differently: Take 3.125 mg by mouth every  morning.  02/20/19   Aline August, MD  cetirizine (ZYRTEC) 10 MG tablet Take 10 mg by mouth daily as needed for allergies or rhinitis.    [provider]  cholecalciferol (VITAMIN D) 25 MCG (1000 UNIT) tablet Take 4,000 Units by mouth every evening.     [provider]  Cyanocobalamin (B-12 PO) Take 1 tablet by mouth every other day.     [provider]  doxycycline (VIBRAMYCIN) 100 MG capsule Take 1 capsule (100 mg total) by mouth 2 (two) times daily. One po bid x 7 days 06/16/20   Ripley Fraise, MD  enalapril (VASOTEC) 10 MG tablet Take 10 mg by mouth daily. 06/04/16   [provider]  famotidine (PEPCID) 40 MG tablet Take 40 mg by mouth 2 (two) times daily.  12/28/18   [provider]  Fluticasone-Salmeterol (ADVAIR DISKUS) 250-50 MCG/DOSE AEPB Inhale 1 puff into the lungs 2 (two) times daily. 06/23/19 09/21/19  Harvie Heck, MD  guaiFENesin-dextromethorphan (ROBITUSSIN DM) 100-10 MG/5ML syrup Take 10 mLs by mouth every 4 (four) hours as needed for cough. 02/20/19   Aline August, MD  mirtazapine (REMERON) 30 MG tablet Take 1 tablet (30 mg total) by mouth at bedtime. 07/25/20 07/25/21  Arfeen, Arlyce Harman, MD  montelukast (SINGULAIR) 10 MG tablet Take 1 tablet (10 mg total) by mouth daily. 06/23/19 09/21/19  Harvie Heck, MD  multivitamin (ONE-A-DAY MEN'S) TABS tablet Take 1 tablet by mouth daily.    [provider]  nicotine (NICODERM CQ - DOSED IN MG/24 HOURS) 14 mg/24hr patch Place 1 patch (14 mg total) onto the skin daily. 02/20/19   Aline August, MD  omeprazole (PRILOSEC) 20 MG capsule Take 1 capsule (20 mg total) by mouth daily. Patient not taking: Reported on 06/20/2019 03/14/19   Domenic Moras, PA-C  OXYGEN Inhale 2 L/min into the lungs continuous.     [provider]  polyethylene glycol (MIRALAX / GLYCOLAX) 17 g packet Take 17 g by mouth daily as needed for mild constipation.    [provider]  risperiDONE (RISPERDAL) 3 MG tablet  Take 1 tablet (3 mg total) by mouth 2 (two) times daily. 07/25/20   Arfeen, Arlyce Harman, MD  sodium chloride (OCEAN) 0.65 % SOLN nasal spray Place 1 spray into both nostrils as needed for congestion.    [provider]  SPIRULINA PO Take 1 capsule by mouth daily.    [provider]    Allergies    Patient has no known allergies.  Review of Systems   Review of Systems  All other systems reviewed and are negative.   Physical Exam Updated Vital Signs SpO2 (!) 86%   Physical Exam Vitals and nursing note reviewed.  Constitutional:      Appearance:  He is well-developed. He is not diaphoretic.  HENT:     Head: Normocephalic and atraumatic.     Right Ear: External ear normal.     Left Ear: External ear normal.  Eyes:     General: No scleral icterus.       Right eye: No discharge.        Left eye: No discharge.     Conjunctiva/sclera: Conjunctivae normal.  Neck:     Trachea: No tracheal deviation.  Cardiovascular:     Rate and Rhythm: Normal rate and regular rhythm.  Pulmonary:     Effort: Accessory muscle usage present. No respiratory distress.     Breath sounds: No stridor. Wheezing present. No rales.  Abdominal:     General: Bowel sounds are normal. There is no distension.     Palpations: Abdomen is soft.     Tenderness: There is no abdominal tenderness. There is no guarding or rebound.  Musculoskeletal:        General: No tenderness.     Cervical back: Neck supple.     Right lower leg: No tenderness. No edema.     Left lower leg: No tenderness. No edema.  Skin:    General: Skin is warm and dry.     Findings: No rash.  Neurological:     Mental Status: He is alert.     Cranial Nerves: No cranial nerve deficit (no facial droop, extraocular movements intact, no slurred speech).     Sensory: No sensory deficit.     Motor: No abnormal muscle tone or seizure activity.     Coordination: Coordination normal.     ED Results / Procedures / Treatments    Labs (all labs ordered are listed, but only abnormal results are displayed) Labs Reviewed  I-STAT ARTERIAL BLOOD GAS, ED    EKG EKG Interpretation  Date/Time:  Thursday Aug 02 2020 14:22:04 EDT Ventricular Rate:  94 PR Interval:  142 QRS Duration: 85 QT Interval:  334 QTC Calculation: 420 R Axis:   95 Text Interpretation: Sinus rhythm Right axis deviation Borderline repolarization abnormality similar to April 2022 Confirmed by Sherwood Gambler [phone]) on 08/02/2020 4:55:02 PM   Radiology DG Chest Port 1 View  Result Date: 08/02/2020 CLINICAL DATA:  dyspnea, increased difficulty breathing at home EXAM: PORTABLE CHEST 1 VIEW COMPARISON:  Radiograph 06/15/2020, chest CT 06/20/2019 FINDINGS: Unchanged cardiomediastinal silhouette. There is severe, advanced emphysema with large bullae bilaterally. Persistent lower lung airspace opacities bilaterally, likely combination of scarring and or atelectasis. There is no large pleural effusion or visible pneumothorax. IMPRESSION: Advanced bullous emphysematous disease. Persistent lower lung opacities, likely combination of scarring and or atelectasis. No definite new focal airspace disease. Electronically Signed   By: Maurine Simmering   On: 08/02/2020 15:06    Procedures Procedures   Medications Ordered in ED Medications - No data to display  ED Course  I have reviewed the triage vital signs and the nursing notes.  Pertinent labs & imaging results that were available during my care of the patient were reviewed by me and considered in my medical decision making (see chart for details).  Clinical Course as of 08/03/20 0701  Thu Aug 02, 2020  1523 Covid test is negative.  CBC a is normal [JK]  1523 Chest x-ray consistent with COPD [JK]    Clinical Course User Index [JK] Dorie Rank, MD   MDM Rules/Calculators/A&P  Patient presented to ED with complaints of shortness of breath.  Patient has known history of COPD and does  continue to smoke.  Initially treated with CPAP as well as breathing treatments by EMS.  Patient noted improvement.  On arrival he immediately did not want to continue his CPAP or transition to BiPAP.  Patient appeared to be mentating clearly.  He was not diaphoretic.  Patient was given additional beta agonist treatments and when his COVID test was negative and hour-long neb was ordered.  Patient was also treated with IV steroids.  Suspect patient will require admission to the hospital for COPD exacerbation although initial ED work-up is still pending.  Care was turned over to Dr. Regenia Skeeter  Final Clinical Impression(s) / ED Diagnoses Final diagnoses:  COPD exacerbation [facility])      Dorie Rank, MD 08/03/20 [phone]

## 2020-08-02 NOTE — Progress Notes (Signed)
Patient arrived via EMS on CPAP.  When took patient off of CPAP to place on Bipap, patient refused to be on bipap.  Patient stated that he could not breath with the mask.  MD present in patient room and attempted to encourage bipap however patient still refused.  Placed patient on 4L [place].  ABG obtained.  RN currently giving inhaler.  Will continue to monitor.    Ref. Range 08/02/2020 14:15  Sample type Unknown ARTERIAL  pH, Arterial Latest Ref Range: 7.350 - 7.450  7.428  pCO2 arterial Latest Ref Range: 32.0 - 48.0 mmHg 38.1  pO2, Arterial Latest Ref Range: 83.0 - 108.0 mmHg 108  TCO2 Latest Ref Range: 22 - 32 mmol/L 26  Acid-Base Excess Latest Ref Range: 0.0 - 2.0 mmol/L 1.0  Bicarbonate Latest Ref Range: 20.0 - 28.0 mmol/L 25.3  O2 Saturation Latest Units: % 98.0  Patient temperature Unknown 98.1 F  Collection site Unknown Radial

## 2020-08-02 NOTE — ED Notes (Signed)
Dinner tray ordered, estimated delivery time is 20:55

## 2020-08-02 NOTE — ED Notes (Signed)
Received verbal report from Lauree Chandler. RN

## 2020-08-02 NOTE — ED Notes (Signed)
resp at bedside to admin breathing treatment

## 2020-08-02 NOTE — ED Notes (Signed)
Pt ambulated in room with a steady gait. O2 sats 93% RA. Pt then began getting real short of breath. Sats dropped to 87% RA. This RN placed pt on 4L  brining sats up to 96%. Pt a/o x4 and resting. Regenia Skeeter, MD has been notified.

## 2020-08-02 NOTE — ED Provider Notes (Signed)
Care transferred to me.  Pending reassessment after continuous albuterol neb.  Patient was feeling better but when he tried to walk around he became dyspneic and his sats went 88%.  I think he will need further supportive care for COPD exacerbation.  Admit to Dr. Hal Hope.   Sherwood Gambler, MD 08/02/20 [phone]

## 2020-08-02 NOTE — ED Triage Notes (Signed)
BIB [facility] when wife called to report pt had increased difficulty breathing at home. EMS notes pt pale, diaphoretic, labored. EMS reports pt was 86 on RA on arrival. Pt placed on CPAP, HR in 90's and 100% with CPAP.   Pt took 1 duo neb at home with no improvement    Hx COPD

## 2020-08-03 ENCOUNTER — Other Ambulatory Visit: Payer: Self-pay

## 2020-08-03 DIAGNOSIS — I10 Essential (primary) hypertension: Secondary | ICD-10-CM

## 2020-08-03 DIAGNOSIS — Z9049 Acquired absence of other specified parts of digestive tract: Secondary | ICD-10-CM | POA: Diagnosis not present

## 2020-08-03 DIAGNOSIS — F17210 Nicotine dependence, cigarettes, uncomplicated: Secondary | ICD-10-CM | POA: Diagnosis present

## 2020-08-03 DIAGNOSIS — Z8249 Family history of ischemic heart disease and other diseases of the circulatory system: Secondary | ICD-10-CM | POA: Diagnosis not present

## 2020-08-03 DIAGNOSIS — Z7952 Long term (current) use of systemic steroids: Secondary | ICD-10-CM | POA: Diagnosis not present

## 2020-08-03 DIAGNOSIS — J439 Emphysema, unspecified: Secondary | ICD-10-CM | POA: Diagnosis present

## 2020-08-03 DIAGNOSIS — J9611 Chronic respiratory failure with hypoxia: Secondary | ICD-10-CM | POA: Diagnosis present

## 2020-08-03 DIAGNOSIS — Z20822 Contact with and (suspected) exposure to covid-19: Secondary | ICD-10-CM | POA: Diagnosis present

## 2020-08-03 DIAGNOSIS — Z833 Family history of diabetes mellitus: Secondary | ICD-10-CM | POA: Diagnosis not present

## 2020-08-03 DIAGNOSIS — E785 Hyperlipidemia, unspecified: Secondary | ICD-10-CM | POA: Diagnosis present

## 2020-08-03 DIAGNOSIS — J441 Chronic obstructive pulmonary disease with (acute) exacerbation: Secondary | ICD-10-CM | POA: Diagnosis not present

## 2020-08-03 DIAGNOSIS — Z7951 Long term (current) use of inhaled steroids: Secondary | ICD-10-CM | POA: Diagnosis not present

## 2020-08-03 DIAGNOSIS — Z79899 Other long term (current) drug therapy: Secondary | ICD-10-CM | POA: Diagnosis not present

## 2020-08-03 DIAGNOSIS — Z9981 Dependence on supplemental oxygen: Secondary | ICD-10-CM | POA: Diagnosis not present

## 2020-08-03 DIAGNOSIS — F203 Undifferentiated schizophrenia: Secondary | ICD-10-CM | POA: Diagnosis not present

## 2020-08-03 DIAGNOSIS — Z66 Do not resuscitate: Secondary | ICD-10-CM | POA: Diagnosis present

## 2020-08-03 DIAGNOSIS — Z823 Family history of stroke: Secondary | ICD-10-CM | POA: Diagnosis not present

## 2020-08-03 DIAGNOSIS — F209 Schizophrenia, unspecified: Secondary | ICD-10-CM | POA: Diagnosis present

## 2020-08-03 LAB — GLUCOSE, CAPILLARY
Glucose-Capillary: 181 mg/dL — ABNORMAL HIGH (ref 70–99)
Glucose-Capillary: 160 mg/dL — ABNORMAL HIGH (ref 70–99)
Glucose-Capillary: 127 mg/dL — ABNORMAL HIGH (ref 70–99)

## 2020-08-03 LAB — CBC
HCT: 40.3 % (ref 39.0–52.0)
Hemoglobin: 13.2 g/dL (ref 13.0–17.0)
MCH: 29.7 pg (ref 26.0–34.0)
MCHC: 32.8 g/dL (ref 30.0–36.0)
MCV: 90.6 fL (ref 80.0–100.0)
Platelets: 278 10*3/uL (ref 150–400)
RBC: 4.45 MIL/uL (ref 4.22–5.81)
RDW: 12.8 % (ref 11.5–15.5)
WBC: 10.3 10*3/uL (ref 4.0–10.5)
nRBC: 0 % (ref 0.0–0.2)

## 2020-08-03 LAB — BASIC METABOLIC PANEL
Anion gap: 10 (ref 5–15)
BUN: 9 mg/dL (ref 8–23)
CO2: 25 mmol/L (ref 22–32)
Calcium: 8.6 mg/dL — ABNORMAL LOW (ref 8.9–10.3)
Chloride: 97 mmol/L — ABNORMAL LOW (ref 98–111)
Creatinine, Ser: 0.95 mg/dL (ref 0.61–1.24)
GFR, Estimated: >60 mL/min (ref >60)
Glucose, Bld: 212 mg/dL — ABNORMAL HIGH (ref 70–99)
Potassium: 4.1 mmol/L (ref 3.5–5.1)
Sodium: 132 mmol/L — ABNORMAL LOW (ref 135–145)

## 2020-08-03 MED ORDER — ALBUTEROL SULFATE (2.5 MG/3ML) 0.083% IN NEBU
2.5000 mg | INHALATION_SOLUTION | RESPIRATORY_TRACT | Status: DC
  Administered 2020-08-03 – 2020-08-04 (×6): 2.5 mg via RESPIRATORY_TRACT
  Filled 2020-08-03 (×6): qty 3

## 2020-08-03 MED ORDER — ALBUTEROL SULFATE (2.5 MG/3ML) 0.083% IN NEBU: 2.5000 mg | INHALATION_SOLUTION | Freq: Two times a day (BID) | RESPIRATORY_TRACT | Status: DC

## 2020-08-03 MED ORDER — IPRATROPIUM BROMIDE 0.02 % IN SOLN
0.5000 mg | Freq: Two times a day (BID) | RESPIRATORY_TRACT | Status: DC
  Administered 2020-08-03 – 2020-08-04 (×2): 0.5 mg via RESPIRATORY_TRACT
  Filled 2020-08-03 (×2): qty 2.5

## 2020-08-03 MED ORDER — HYDROXYZINE HCL 10 MG PO TABS
10.0000 mg | ORAL_TABLET | Freq: Three times a day (TID) | ORAL | Status: DC | PRN
  Administered 2020-08-03 (×2): 10 mg via ORAL
  Filled 2020-08-03 (×2): qty 1

## 2020-08-03 NOTE — Progress Notes (Addendum)
PROGRESS NOTE        PATIENT DETAILS Name: Blake Mcknight Age: 65 y.o. Sex: male Date of Birth: 1955-11-19 Admit Date: 08/02/2020 Admitting Physician Rise Patience, MD ATF:[id number], [place] Beard, MD  Brief Narrative: Patient is a 65 y.o. male with history of COPD, chronic hypoxic respiratory failure on 2 L of oxygen at home, HLD, HTN, schizophrenia-we will presented with shortness of breath-found to have COPD exacerbation and admitted to the hospitalist service.  Significant events: 5/19>> admit to [facility] for COPD exac  Significant studies: 5/19>> chest x-ray: No focal airspace disease.  Antimicrobial therapy: Doxycycline: 5/19>>  Microbiology data: 5/19>> COVID-19/influenza PCR: Negative  Procedures : None  Consults: None  DVT Prophylaxis : enoxaparin (LOVENOX) injection 40 mg Start: 08/02/20 2000   Subjective: Although improved-does not think he is yet at baseline  Assessment/Plan: COPD exacerbation: Moving air-still with coarse rhonchi-although improved-he is not yet at baseline-plan is to continue steroids/bronchodilators/empiric doxycycline.  He reassess on 5/21 for potential discharge of he is close to his baseline.    Chronic hypoxic respiratory failure: Patient claims he is on 2 L of oxygen at baseline.  Deveshwar currently stable on 2 L.  HTN: BP stable on enalapril and Coreg.  If bronchospasm continues-May need to switch Coreg to a more selective beta-blocker.  HLD: Continue statin  Schizophrenia: Stable-on Cogentin/Risperdal.  Diet: Diet Order            Diet Heart Room service appropriate? Yes; Fluid consistency: Thin  Diet effective now                  Code Status: Full code   Family Communication: Spoke with Sister-[name] over phone 5/20  Disposition Plan: Status is: Observation  The patient will require care spanning > 2 midnights and should be moved to inpatient because: Inpatient level of  care appropriate due to severity of illness  Dispo: The patient is from: Home              Anticipated d/c is to: Home              Patient currently is not medically stable to d/c.   Difficult to place patient No    Barriers to Discharge: COPD exacerbation-on IV steroids/bronchodilators-although improved not yet at baseline-probable discharge on 5/21 if clinical improvement continues.  Antimicrobial agents: Anti-infectives (From admission, onward)   Start     Dose/Rate Route Frequency Ordered Stop   08/02/20 2200  doxycycline (VIBRA-TABS) tablet 100 mg        100 mg Oral Every 12 hours 08/02/20 1951 08/07/20 2159       Time spent: 25- minutes-Greater than 50% of this time was spent in counseling, explanation of diagnosis, planning of further management, and coordination of care.  MEDICATIONS: Scheduled Meds: . albuterol  2.5 mg Nebulization Q4H  . atorvastatin  10 mg Oral q1800  . budesonide (PULMICORT) nebulizer solution  0.25 mg Nebulization BID  . carvedilol  3.125 mg Oral q morning  . doxycycline  100 mg Oral Q12H  . enalapril  10 mg Oral Daily  . enoxaparin (LOVENOX) injection  40 mg Subcutaneous Q24H  . famotidine  40 mg Oral BID  . ipratropium  0.5 mg Nebulization Q4H  . methylPREDNISolone (SOLU-MEDROL) injection  40 mg Intravenous Q12H  . mirtazapine  30 mg Oral QHS  . risperiDONE  3 mg Oral BID  . tamsulosin  0.4 mg Oral QHS   Continuous Infusions: PRN Meds:.acetaminophen **OR** acetaminophen, albuterol, benztropine, polyethylene glycol   PHYSICAL EXAM: Vital signs: Vitals:   08/03/20 0442 08/03/20 0558 08/03/20 0723 08/03/20 0817  BP: 131/86  131/80   Pulse: 91  85 88  Resp: 15  20 15   Temp: 98.2 F (36.8 C)  98.6 F (37 C)   TempSrc: Axillary  Oral   SpO2: 98% 99% 96% 100%  Weight:      Height:       Filed Weights   08/03/20 0129  Weight: 69.6 kg   Body mass index is 24.76 kg/m.   Gen Exam:Alert awake-not in any distress HEENT:atraumatic,  normocephalic Chest: ++ Coarse rhonchi bilaterally CVS:S1S2 regular Abdomen:soft non tender, non distended Extremities:no edema Neurology: Non focal Skin: no rash  I have personally reviewed following labs and imaging studies  LABORATORY DATA: CBC: Recent Labs  Lab 08/02/20 1410 08/02/20 1415 08/02/20 2007 08/03/20 0217  WBC 8.7  --  5.8 10.3  HGB 14.1 13.6 15.0 13.2  HCT 42.0 40.0 45.1 40.3  MCV 91.1  --  90.9 90.6  PLT 294  --  274 833    Basic Metabolic Panel: Recent Labs  Lab 08/02/20 1410 08/02/20 1415 08/02/20 2007 08/03/20 0217  NA 135 136  --  132*  K 4.1 3.8  --  4.1  CL 102  --   --  97*  CO2 25  --   --  25  GLUCOSE 112*  --   --  212*  BUN 5*  --   --  9  CREATININE 0.83  --  0.92 0.95  CALCIUM 8.9  --   --  8.6*    GFR: Estimated Creatinine Clearance: 72.8 mL/min (by C-G formula based on SCr of 0.95 mg/dL).  Liver Function Tests: Recent Labs  Lab 08/02/20 1410  AST 23  ALT 24  ALKPHOS 42  BILITOT 0.5  PROT 6.6  ALBUMIN 3.6   No results for input(s): LIPASE, AMYLASE in the last 168 hours. No results for input(s): AMMONIA in the last 168 hours.  Coagulation Profile: No results for input(s): INR, PROTIME in the last 168 hours.  Cardiac Enzymes: No results for input(s): CKTOTAL, CKMB, CKMBINDEX, TROPONINI in the last 168 hours.  BNP (last 3 results) No results for input(s): PROBNP in the last 8760 hours.  Lipid Profile: No results for input(s): CHOL, HDL, LDLCALC, TRIG, CHOLHDL, LDLDIRECT in the last 72 hours.  Thyroid Function Tests: No results for input(s): TSH, T4TOTAL, FREET4, T3FREE, THYROIDAB in the last 72 hours.  Anemia Panel: No results for input(s): VITAMINB12, FOLATE, FERRITIN, TIBC, IRON, RETICCTPCT in the last 72 hours.  Urine analysis:    Component Value Date/Time   COLORURINE STRAW (A) 06/15/2020 1434   APPEARANCEUR CLEAR 06/15/2020 1434   LABSPEC 1.005 06/15/2020 1434   PHURINE 7.0 06/15/2020 1434   GLUCOSEU  NEGATIVE 06/15/2020 1434   HGBUR NEGATIVE 06/15/2020 1434   BILIRUBINUR NEGATIVE 06/15/2020 1434   KETONESUR NEGATIVE 06/15/2020 1434   PROTEINUR NEGATIVE 06/15/2020 1434   UROBILINOGEN 0.2 01/29/2019 1634   NITRITE NEGATIVE 06/15/2020 1434   LEUKOCYTESUR NEGATIVE 06/15/2020 1434    Sepsis Labs: Lactic Acid, Venous    Component Value Date/Time   LATICACIDVEN 2.2 (HH) 06/05/2019 1553    MICROBIOLOGY: Recent Results (from the past 240 hour(s))  Resp Panel by RT-PCR (Flu A&B, Covid) Nasopharyngeal Swab     Status: None  Collection Time: 08/02/20  5:38 PM   Specimen: Nasopharyngeal Swab; Nasopharyngeal(NP) swabs in vial transport medium  Result Value Ref Range Status   SARS Coronavirus 2 by RT PCR NEGATIVE NEGATIVE Final    Comment: (NOTE) SARS-CoV-2 target nucleic acids are NOT DETECTED.  The SARS-CoV-2 RNA is generally detectable in upper respiratory specimens during the acute phase of infection. The lowest concentration of SARS-CoV-2 viral copies this assay can detect is 138 copies/mL. A negative result does not preclude SARS-Cov-2 infection and should not be used as the sole basis for treatment or other patient management decisions. A negative result may occur with  improper specimen collection/handling, submission of specimen other than nasopharyngeal swab, presence of viral mutation(s) within the areas targeted by this assay, and inadequate number of viral copies(<138 copies/mL). A negative result must be combined with clinical observations, patient history, and epidemiological information. The expected result is Negative.  Fact Sheet for Patients:  EntrepreneurPulse.com.au  Fact Sheet for Healthcare Providers:  IncredibleEmployment.be  This test is no t yet approved or cleared by the [place] FDA and  has been authorized for detection and/or diagnosis of SARS-CoV-2 by FDA under an Emergency Use Authorization (EUA). This EUA  will remain  in effect (meaning this test can be used) for the duration of the COVID-19 declaration under Section 564(b)(1) of the Act, 21 U.S.C.section 360bbb-3(b)(1), unless the authorization is terminated  or revoked sooner.       Influenza A by PCR NEGATIVE NEGATIVE Final   Influenza B by PCR NEGATIVE NEGATIVE Final    Comment: (NOTE) The Xpert Xpress SARS-CoV-2/FLU/RSV plus assay is intended as an aid in the diagnosis of influenza from Nasopharyngeal swab specimens and should not be used as a sole basis for treatment. Nasal washings and aspirates are unacceptable for Xpert Xpress SARS-CoV-2/FLU/RSV testing.  Fact Sheet for Patients: EntrepreneurPulse.com.au  Fact Sheet for Healthcare Providers: IncredibleEmployment.be  This test is not yet approved or cleared by the [place] FDA and has been authorized for detection and/or diagnosis of SARS-CoV-2 by FDA under an Emergency Use Authorization (EUA). This EUA will remain in effect (meaning this test can be used) for the duration of the COVID-19 declaration under Section 564(b)(1) of the Act, 21 U.S.C. section 360bbb-3(b)(1), unless the authorization is terminated or revoked.  Performed at [facility], [place] [address], [place], [place] [address]     RADIOLOGY STUDIES/RESULTS: DG Chest Port 1 View  Result Date: 08/02/2020 CLINICAL DATA:  dyspnea, increased difficulty breathing at home EXAM: PORTABLE CHEST 1 VIEW COMPARISON:  Radiograph 06/15/2020, chest CT 06/20/2019 FINDINGS: Unchanged cardiomediastinal silhouette. There is severe, advanced emphysema with large bullae bilaterally. Persistent lower lung airspace opacities bilaterally, likely combination of scarring and or atelectasis. There is no large pleural effusion or visible pneumothorax. IMPRESSION: Advanced bullous emphysematous disease. Persistent lower lung opacities, likely combination of scarring and or atelectasis. No  definite new focal airspace disease. Electronically Signed   By: Maurine Simmering   On: 08/02/2020 15:06     LOS: 0 days   Oren Binet, MD  [facility]    To contact the attending provider between 7A-7P or the covering provider during after hours 7P-7A, please log into the web site [email or web address] and access using universal [place] password for that web site. If you do not have the password, please call the hospital operator.  08/03/2020, 11:19 AM

## 2020-08-03 NOTE — ED Notes (Signed)
Attempted to call report at this time and advised unable to take report at this time

## 2020-08-03 NOTE — ED Notes (Addendum)
Sister called and made aware of admit status but there was no answer

## 2020-08-03 NOTE — Progress Notes (Signed)
RT was called by RN pt requesting breathing treatment. RT at bedside. Pt's VS are stable, SpO2 99% on 2LNC. Pt has audible experatory wheezing. Treatment was given with improvement. Per RN, she spoke with pt's family. Family states that pt takes albuterol at home every four hours. If pt does not receive breathing treatment he panics. RT will continue to monitor pt.

## 2020-08-04 DIAGNOSIS — J441 Chronic obstructive pulmonary disease with (acute) exacerbation: Secondary | ICD-10-CM | POA: Diagnosis not present

## 2020-08-04 LAB — GLUCOSE, CAPILLARY: Glucose-Capillary: 150 mg/dL — ABNORMAL HIGH (ref 70–99)

## 2020-08-04 MED ORDER — COVID-19 MRNA VAC-TRIS(PFIZER) 30 MCG/0.3ML IM SUSP
0.3000 mL | Freq: Once | INTRAMUSCULAR | Status: AC
  Administered 2020-08-04: 0.3 mL via INTRAMUSCULAR
  Filled 2020-08-04: qty 0.3

## 2020-08-04 MED ORDER — DOXYCYCLINE HYCLATE 100 MG PO CAPS: 100.0000 mg | ORAL_CAPSULE | Freq: Two times a day (BID) | ORAL | 0 refills | Status: DC

## 2020-08-04 MED ORDER — COVID-19 MRNA VAC-TRIS(PFIZER) 30 MCG/0.3ML IM SUSP
0.3000 mL | Freq: Once | INTRAMUSCULAR | Status: DC
  Filled 2020-08-04: qty 0.3

## 2020-08-04 NOTE — Discharge Summary (Signed)
Physician Discharge Summary  ANTRELL TIPLER ZJQ:[id number] DOB: 06-06-1955 DOA: 08/02/2020  PCP: [name], MD  Admit date: 08/02/2020 Discharge date: 08/04/2020  Admitted From: home  Disposition:  Home   Recommendations for Outpatient Follow-up:  1. Follow up with PCP in 1-2 weeks 2. Please obtain BMP/CBC in one week 3. Schedule follow up with your pulmonology in 2 to 3 weeks.  Home Health: Not applicable Equipment/Devices: Oxygen available at home  Discharge Condition: Stable CODE STATUS: DNR Diet recommendation: Low-salt diet  Discharge summary: 65 year old gentleman with COPD, chronic hypoxemic respiratory failure on 2 L oxygen at home, hypertension, hyperlipidemia and schizophrenia lives at home with his sisters presented with shortness of breath and wheezing and treated as COPD exacerbation.  Admitted to the hospital due to significant symptoms.  Chest x-ray was normal.  No focal airspace disease.  COVID-19 and influenza were negative.  Treated with aggressive bronchodilator therapy, IV steroids, inhalational steroids, doxycycline with improvement of symptoms.  Today he is back to his usual state of shortness of breath on mobility but on 2 L oxygen.  Also wishes to get third dose of Pfizer vaccination that is administered before discharge.  Patient is discharged home on doxycycline, to complete 7 days of therapy.  Patient already optimized on bronchodilator therapy and steroid inhaler at home.  Patient does have prescription of prednisone 10 mg 2 times a day that he will continue at home and will follow up with his pulmonologist. Other chronic medical issues remained stable and resume all home medications.  Looks like patient is still intermittently smokes, with counseled against it.  Does not want any nicotine supplementation.  Discharge Diagnoses:  Principal Problem:   Chronic obstructive pulmonary disease with acute exacerbation (HCC) Active Problems:   HTN  (hypertension)   HLD (hyperlipidemia)   Schizophrenia (HCC)   COPD exacerbation ([place])    Discharge Instructions  Discharge Instructions    Call MD for:  difficulty breathing, headache or visual disturbances   Complete by: As directed    Diet - low sodium heart healthy   Complete by: As directed    Discharge instructions   Complete by: As directed    Schedule follow up with your lung doctor Continue to take prednisone 10 mg two times a day until you see your lung doctor   Increase activity slowly   Complete by: As directed      Allergies as of 08/04/2020   No Known Allergies     Medication List    STOP taking these medications   amoxicillin-clavulanate 875-125 MG tablet Commonly known as: Augmentin   nicotine 14 mg/24hr patch Commonly known as: NICODERM CQ - dosed in mg/24 hours   omeprazole 20 MG capsule Commonly known as: PRILOSEC     TAKE these medications   albuterol 108 (90 Base) MCG/ACT inhaler Commonly known as: VENTOLIN HFA Inhale 2 puffs into the lungs every 4 (four) hours as needed for wheezing or shortness of breath.   albuterol (2.5 MG/3ML) 0.083% nebulizer solution Commonly known as: PROVENTIL Take 3 mLs (2.5 mg total) by nebulization every 3 (three) hours as needed for wheezing or shortness of breath.   atorvastatin 10 MG tablet Commonly known as: LIPITOR Take 10 mg by mouth daily at 6 PM.   B-12 PO Take 1 tablet by mouth every other day.   benztropine 2 MG tablet Commonly known as: COGENTIN Take 1 tablet (2 mg total) by mouth 2 (two) times daily as needed for tremors.  Breztri Aerosphere 160-9-4.8 MCG/ACT Aero Generic drug: Budeson-Glycopyrrol-Formoterol Inhale 2 puffs into the lungs in the morning and at bedtime.   carvedilol 3.125 MG tablet Commonly known as: COREG Take 1 tablet (3.125 mg total) by mouth 2 (two) times daily with a meal. What changed: when to take this   cetirizine 10 MG tablet Commonly known as: ZYRTEC Take 10 mg  by mouth daily as needed for allergies or rhinitis.   cholecalciferol 25 MCG (1000 UNIT) tablet Commonly known as: VITAMIN D Take 4,000 Units by mouth every evening.   doxycycline 100 MG capsule Commonly known as: VIBRAMYCIN Take 1 capsule (100 mg total) by mouth 2 (two) times daily. One po bid x 7 days   enalapril 10 MG tablet Commonly known as: VASOTEC Take 10 mg by mouth daily.   famotidine 40 MG tablet Commonly known as: PEPCID Take 40 mg by mouth 2 (two) times daily.   Fluticasone-Salmeterol 250-50 MCG/DOSE Aepb Commonly known as: Advair Diskus Inhale 1 puff into the lungs 2 (two) times daily.   guaiFENesin-dextromethorphan 100-10 MG/5ML syrup Commonly known as: ROBITUSSIN DM Take 10 mLs by mouth every 4 (four) hours as needed for cough.   mirtazapine 30 MG tablet Commonly known as: REMERON Take 1 tablet (30 mg total) by mouth at bedtime.   montelukast 10 MG tablet Commonly known as: SINGULAIR Take 1 tablet (10 mg total) by mouth daily.   multivitamin Tabs tablet Take 1 tablet by mouth daily.   OXYGEN Inhale 2 L/min into the lungs continuous.   polyethylene glycol 17 g packet Commonly known as: MIRALAX / GLYCOLAX Take 17 g by mouth daily as needed for mild constipation.   predniSONE 10 MG tablet Commonly known as: DELTASONE Take 10 mg by mouth 2 (two) times daily.   risperiDONE 3 MG tablet Commonly known as: RISPERDAL Take 1 tablet (3 mg total) by mouth 2 (two) times daily.   sodium chloride 0.65 % Soln nasal spray Commonly known as: OCEAN Place 1 spray into both nostrils as needed for congestion.   tamsulosin 0.4 MG Caps capsule Commonly known as: FLOMAX Take 0.4 mg by mouth at bedtime.   VITAMIN C PO Take 1 tablet by mouth 3 (three) times a week.       Follow-up Information    [name], MD Follow up in 2 week(s).   Specialty: Pulmonary Disease Contact information: [place] [address] [phone]               No Known Allergies  Consultations:  None   Procedures/Studies: DG Chest Port 1 View  Result Date: 08/02/2020 CLINICAL DATA:  dyspnea, increased difficulty breathing at home EXAM: PORTABLE CHEST 1 VIEW COMPARISON:  Radiograph 06/15/2020, chest CT 06/20/2019 FINDINGS: Unchanged cardiomediastinal silhouette. There is severe, advanced emphysema with large bullae bilaterally. Persistent lower lung airspace opacities bilaterally, likely combination of scarring and or atelectasis. There is no large pleural effusion or visible pneumothorax. IMPRESSION: Advanced bullous emphysematous disease. Persistent lower lung opacities, likely combination of scarring and or atelectasis. No definite new focal airspace disease. Electronically Signed   By: Maurine Simmering   On: 08/02/2020 15:06   (Echo, Carotid, EGD, Colonoscopy, ERCP)    Subjective: Patient seen and examined.  Poor historian.  Denies any complaints.  He tells me he is back to himself and is eager to go home.   Discharge Exam: Vitals:   08/04/20 0848 08/04/20 1145  BP:    Pulse:    Resp:  Temp:    SpO2: 100% 99%   Vitals:   08/04/20 0437 08/04/20 0844 08/04/20 0848 08/04/20 1145  BP: (!) 141/85     Pulse: 99     Resp: 12     Temp: 98.1 F (36.7 C)     TempSrc: Axillary     SpO2: 98% 100% 100% 99%  Weight:      Height:        General: Pt is alert, awake, not in acute distress Cardiovascular: RRR, S1/S2 +, no rubs, no gallops Respiratory: CTA bilaterally, no wheezing, no rhonchi, no added sounds.  Comfortable on 2 L oxygen. Abdominal: Soft, NT, ND, bowel sounds + Extremities: no edema, no cyanosis    The results of significant diagnostics from this hospitalization (including imaging, microbiology, ancillary and laboratory) are listed below for reference.     Microbiology: Recent Results (from the past 240 hour(s))  Resp Panel by RT-PCR (Flu A&B, Covid) Nasopharyngeal Swab     Status: None   Collection Time:  08/02/20  5:38 PM   Specimen: Nasopharyngeal Swab; Nasopharyngeal(NP) swabs in vial transport medium  Result Value Ref Range Status   SARS Coronavirus 2 by RT PCR NEGATIVE NEGATIVE Final    Comment: (NOTE) SARS-CoV-2 target nucleic acids are NOT DETECTED.  The SARS-CoV-2 RNA is generally detectable in upper respiratory specimens during the acute phase of infection. The lowest concentration of SARS-CoV-2 viral copies this assay can detect is 138 copies/mL. A negative result does not preclude SARS-Cov-2 infection and should not be used as the sole basis for treatment or other patient management decisions. A negative result may occur with  improper specimen collection/handling, submission of specimen other than nasopharyngeal swab, presence of viral mutation(s) within the areas targeted by this assay, and inadequate number of viral copies(<138 copies/mL). A negative result must be combined with clinical observations, patient history, and epidemiological information. The expected result is Negative.  Fact Sheet for Patients:  EntrepreneurPulse.com.au  Fact Sheet for Healthcare Providers:  IncredibleEmployment.be  This test is no t yet approved or cleared by the [place] FDA and  has been authorized for detection and/or diagnosis of SARS-CoV-2 by FDA under an Emergency Use Authorization (EUA). This EUA will remain  in effect (meaning this test can be used) for the duration of the COVID-19 declaration under Section 564(b)(1) of the Act, 21 U.S.C.section 360bbb-3(b)(1), unless the authorization is terminated  or revoked sooner.       Influenza A by PCR NEGATIVE NEGATIVE Final   Influenza B by PCR NEGATIVE NEGATIVE Final    Comment: (NOTE) The Xpert Xpress SARS-CoV-2/FLU/RSV plus assay is intended as an aid in the diagnosis of influenza from Nasopharyngeal swab specimens and should not be used as a sole basis for treatment. Nasal washings  and aspirates are unacceptable for Xpert Xpress SARS-CoV-2/FLU/RSV testing.  Fact Sheet for Patients: EntrepreneurPulse.com.au  Fact Sheet for Healthcare Providers: IncredibleEmployment.be  This test is not yet approved or cleared by the [place] FDA and has been authorized for detection and/or diagnosis of SARS-CoV-2 by FDA under an Emergency Use Authorization (EUA). This EUA will remain in effect (meaning this test can be used) for the duration of the COVID-19 declaration under Section 564(b)(1) of the Act, 21 U.S.C. section 360bbb-3(b)(1), unless the authorization is terminated or revoked.  Performed at [facility], [place] [address] [place], [place] [address]      Labs: BNP (last 3 results) Recent Labs    06/15/20 1439 08/02/20 1410  BNP 22.2 99991111   Basic Metabolic Panel: Recent Labs  Lab 08/02/20 1410 08/02/20 1415 08/02/20 2007 08/03/20 0217  NA 135 136  --  132*  K 4.1 3.8  --  4.1  CL 102  --   --  97*  CO2 25  --   --  25  GLUCOSE 112*  --   --  212*  BUN 5*  --   --  9  CREATININE 0.83  --  0.92 0.95  CALCIUM 8.9  --   --  8.6*   Liver Function Tests: Recent Labs  Lab 08/02/20 1410  AST 23  ALT 24  ALKPHOS 42  BILITOT 0.5  PROT 6.6  ALBUMIN 3.6   No results for input(s): LIPASE, AMYLASE in the last 168 hours. No results for input(s): AMMONIA in the last 168 hours. CBC: Recent Labs  Lab 08/02/20 1410 08/02/20 1415 08/02/20 2007 08/03/20 0217  WBC 8.7  --  5.8 10.3  HGB 14.1 13.6 15.0 13.2  HCT 42.0 40.0 45.1 40.3  MCV 91.1  --  90.9 90.6  PLT 294  --  274 278   Cardiac Enzymes: No results for input(s): CKTOTAL, CKMB, CKMBINDEX, TROPONINI in the last 168 hours. BNP: Invalid input(s): POCBNP CBG: Recent Labs  Lab 08/03/20 0721 08/03/20 1213 08/03/20 1726 08/04/20 0043  GLUCAP 181* 160* 127* 150*   D-Dimer No results for input(s): DDIMER in the last 72 hours. Hgb A1c No results  for input(s): HGBA1C in the last 72 hours. Lipid Profile No results for input(s): CHOL, HDL, LDLCALC, TRIG, CHOLHDL, LDLDIRECT in the last 72 hours. Thyroid function studies No results for input(s): TSH, T4TOTAL, T3FREE, THYROIDAB in the last 72 hours.  Invalid input(s): FREET3 Anemia work up No results for input(s): VITAMINB12, FOLATE, FERRITIN, TIBC, IRON, RETICCTPCT in the last 72 hours. Urinalysis    Component Value Date/Time   COLORURINE STRAW (A) 06/15/2020 1434   APPEARANCEUR CLEAR 06/15/2020 1434   LABSPEC 1.005 06/15/2020 1434   PHURINE 7.0 06/15/2020 1434   GLUCOSEU NEGATIVE 06/15/2020 1434   HGBUR NEGATIVE 06/15/2020 1434   BILIRUBINUR NEGATIVE 06/15/2020 1434   KETONESUR NEGATIVE 06/15/2020 1434   PROTEINUR NEGATIVE 06/15/2020 1434   UROBILINOGEN 0.2 01/29/2019 1634   NITRITE NEGATIVE 06/15/2020 1434   LEUKOCYTESUR NEGATIVE 06/15/2020 1434   Sepsis Labs Invalid input(s): PROCALCITONIN,  WBC,  LACTICIDVEN Microbiology Recent Results (from the past 240 hour(s))  Resp Panel by RT-PCR (Flu A&B, Covid) Nasopharyngeal Swab     Status: None   Collection Time: 08/02/20  5:38 PM   Specimen: Nasopharyngeal Swab; Nasopharyngeal(NP) swabs in vial transport medium  Result Value Ref Range Status   SARS Coronavirus 2 by RT PCR NEGATIVE NEGATIVE Final    Comment: (NOTE) SARS-CoV-2 target nucleic acids are NOT DETECTED.  The SARS-CoV-2 RNA is generally detectable in upper respiratory specimens during the acute phase of infection. The lowest concentration of SARS-CoV-2 viral copies this assay can detect is 138 copies/mL. A negative result does not preclude SARS-Cov-2 infection and should not be used as the sole basis for treatment or other patient management decisions. A negative result may occur with  improper specimen collection/handling, submission of specimen other than nasopharyngeal swab, presence of viral mutation(s) within the areas targeted by this assay, and  inadequate number of viral copies(<138 copies/mL). A negative result must be combined with clinical observations, patient history, and epidemiological information. The expected result is Negative.  Fact Sheet for Patients:  EntrepreneurPulse.com.au  Fact Sheet for  Healthcare Providers:  IncredibleEmployment.be  This test is no t yet approved or cleared by the [place] and  has been authorized for detection and/or diagnosis of SARS-CoV-2 by FDA under an Emergency Use Authorization (EUA). This EUA will remain  in effect (meaning this test can be used) for the duration of the COVID-19 declaration under Section 564(b)(1) of the Act, 21 U.S.C.section 360bbb-3(b)(1), unless the authorization is terminated  or revoked sooner.       Influenza A by PCR NEGATIVE NEGATIVE Final   Influenza B by PCR NEGATIVE NEGATIVE Final    Comment: (NOTE) The Xpert Xpress SARS-CoV-2/FLU/RSV plus assay is intended as an aid in the diagnosis of influenza from Nasopharyngeal swab specimens and should not be used as a sole basis for treatment. Nasal washings and aspirates are unacceptable for Xpert Xpress SARS-CoV-2/FLU/RSV testing.  Fact Sheet for Patients: EntrepreneurPulse.com.au  Fact Sheet for Healthcare Providers: IncredibleEmployment.be  This test is not yet approved or cleared by the [place] FDA and has been authorized for detection and/or diagnosis of SARS-CoV-2 by FDA under an Emergency Use Authorization (EUA). This EUA will remain in effect (meaning this test can be used) for the duration of the COVID-19 declaration under Section 564(b)(1) of the Act, 21 U.S.C. section 360bbb-3(b)(1), unless the authorization is terminated or revoked.  Performed at [facility], [place] [address], [place], [place] [address]      Time coordinating discharge:  35 minutes  SIGNED:   Barb Merino, MD  [facility] 08/04/2020, 11:54 AM

## 2020-08-04 NOTE — Progress Notes (Signed)
[name] to be D/C'd Home per MD order.  Discussed with the patient and all questions fully answered.  VSS, Skin clean, dry and intact without evidence of skin break down, no evidence of skin tears noted. IV catheter discontinued intact. Site without signs and symptoms of complications. Dressing and pressure applied.  An After Visit Summary was printed and given to the patient. Patient received prescription.  D/c education completed with patient/family including follow up instructions, medication list, d/c activities limitations if indicated, with other d/c instructions as indicated by MD - patient able to verbalize understanding, all questions fully answered.   Patient instructed to return to ED, call 911, or call MD for any changes in condition.   Patient escorted via [place], and D/C home via private auto.  Dene Gentry 08/04/2020 4:24 PM

## 2020-08-06 ENCOUNTER — Other Ambulatory Visit: Payer: Self-pay | Admitting: *Deleted

## 2020-08-06 NOTE — Patient Outreach (Signed)
[facility] Care Management  08/06/2020  Blake Mcknight October 06, 1955 [id number]   [place] Discipline closure. Patient has been admitted to hospital. Patient will be followed by Complex Care Coordinator once discharged.  Plan: RN Health Coach Discipline Closure Discipline Closure letter sent to PCP  [place] Management [phone]

## 2020-08-07 ENCOUNTER — Encounter: Payer: Self-pay | Admitting: *Deleted

## 2020-08-07 ENCOUNTER — Other Ambulatory Visit: Payer: Self-pay | Admitting: *Deleted

## 2020-08-07 NOTE — Patient Outreach (Signed)
[facility]ake Mcknight) Care Management  [place]  08/07/2020   Blake Mcknight Apr 28, 1955 [id number]   Referral Date: 5/23 Referral Source: Health Coach Referral Reason: Admitted to hospital for COPD Insurance: DCE   Incoming call received back from sister.  Identity verified.  This care manager introduced self and stated purpose of call.  [facility] care management services explained.    Social: Lives with sister who is caregiver and POA.  She helps him with bathing, dressing, and preparing meals.  Conditions: Per chart, has history of COPD, HTN, HLD, and schizophrenia.  Sister report member still has cough, not productive at this time.  He is on home O2 as well.  Medications: Reviewed with sister, confirms he is taking all medications as instructed.  Denies concern for financial assistance.      Encounter Medications:  Outpatient Encounter Medications as of 08/07/2020  Medication Sig  . albuterol (PROVENTIL) (2.5 MG/3ML) 0.083% nebulizer solution Take 3 mLs (2.5 mg total) by nebulization every 3 (three) hours as needed for wheezing or shortness of breath.  Marland Kitchen albuterol (VENTOLIN HFA) 108 (90 Base) MCG/ACT inhaler Inhale 2 puffs into the lungs every 4 (four) hours as needed for wheezing or shortness of breath.  . Ascorbic Acid (VITAMIN C PO) Take 1 tablet by mouth 3 (three) times a week.   Marland Kitchen atorvastatin (LIPITOR) 10 MG tablet Take 10 mg by mouth daily at 6 PM.   . benztropine (COGENTIN) 2 MG tablet Take 1 tablet (2 mg total) by mouth 2 (two) times daily as needed for tremors.  Marland Kitchen BREZTRI AEROSPHERE 160-9-4.8 MCG/ACT AERO Inhale 2 puffs into the lungs in the morning and at bedtime.  . carvedilol (COREG) 3.125 MG tablet Take 1 tablet (3.125 mg total) by mouth 2 (two) times daily with a meal. (Patient taking differently: Take 3.125 mg by mouth every morning.)  . cetirizine (ZYRTEC) 10 MG tablet Take 10 mg by mouth daily as needed for allergies or rhinitis.  .  cholecalciferol (VITAMIN D) 25 MCG (1000 UNIT) tablet Take 4,000 Units by mouth every evening.   . Cyanocobalamin (B-12 PO) Take 1 tablet by mouth every other day.  Marland Kitchen doxycycline (VIBRAMYCIN) 100 MG capsule Take 1 capsule (100 mg total) by mouth 2 (two) times daily. One po bid x 7 days  . enalapril (VASOTEC) 10 MG tablet Take 10 mg by mouth daily.  . famotidine (PEPCID) 40 MG tablet Take 40 mg by mouth 2 (two) times daily.  Marland Kitchen guaiFENesin-dextromethorphan (ROBITUSSIN DM) 100-10 MG/5ML syrup Take 10 mLs by mouth every 4 (four) hours as needed for cough.  . mirtazapine (REMERON) 30 MG tablet Take 1 tablet (30 mg total) by mouth at bedtime.  . multivitamin (ONE-A-DAY MEN'S) TABS tablet Take 1 tablet by mouth daily.  . OXYGEN Inhale 2 L/min into the lungs continuous.   . polyethylene glycol (MIRALAX / GLYCOLAX) 17 g packet Take 17 g by mouth daily as needed for mild constipation.  . predniSONE (DELTASONE) 10 MG tablet Take 10 mg by mouth 2 (two) times daily.  . risperiDONE (RISPERDAL) 3 MG tablet Take 1 tablet (3 mg total) by mouth 2 (two) times daily.  . sodium chloride (OCEAN) 0.65 % SOLN nasal spray Place 1 spray into both nostrils as needed for congestion.  . tamsulosin (FLOMAX) 0.4 MG CAPS capsule Take 0.4 mg by mouth at bedtime.  . Fluticasone-Salmeterol (ADVAIR DISKUS) 250-50 MCG/DOSE AEPB Inhale 1 puff into the lungs 2 (two) times daily.  . montelukast (  SINGULAIR) 10 MG tablet Take 1 tablet (10 mg total) by mouth daily.   No facility-administered encounter medications on file as of 08/07/2020.    Functional Status:  In your present state of health, do you have any difficulty performing the following activities: 08/03/2020 08/03/2020  Hearing? - N  Vision? - N  Difficulty concentrating or making decisions? - Y  Walking or climbing stairs? - Y  Dressing or bathing? - Y  Doing errands, shopping? Y Y  Some recent data might be hidden    Fall/Depression Screening: Fall Risk  08/07/2020  01/06/2020 07/08/2019  Falls in the past year? 0 1 0  Number falls in past yr: 0 0 0  Injury with Fall? 0 0 0  Follow up Falls evaluation completed - -   PHQ 2/9 Scores 08/07/2020 07/08/2019  Exception Documentation Medical reason Medical reason    Assessment:  Goals Addressed            This Visit's Progress   . [facility] - Make and Keep All Appointments   On track    Follow Up Date 11/30  Timeframe:  Short-Term Goal Priority:  High Start Date:           5/24                  Expected End Date:  6/24  Barriers: Health Behaviors                        - ask family or friend for a ride - keep a calendar with appointment dates    Why is this important?   Part of staying healthy is seeing the doctor for follow-up care.  If you forget your appointments, there are some things you can do to stay on track.    Notes:   11/11 - Sister will call to schedule PCP appointment   5/24 - Patient admitted to hospital for COPD exacerbation 5/19-5/21.  Sister will call PCP to schedule appointment today    . [facility] - Symptom Exacerbation Prevented or Minimized   Not on track    Timeframe:  Long-Range Goal Priority:  High Start Date:      [id number]                       Expected End Date:     [id number]                 Follow up date: 6/7  Barriers: Knowledge   Notes: 12/13 - Long term COPD management discussed with sister, discussed management of inhalers and nebulizer [phone] Patient is still smoking randomly per sister not every day and just taking a puff.    5/24 - Patient admitted to hospital for COPD exacerbation 5/19-5/21.  Reviewed discharge instructions with sister, report he is taking meds and using inhaler/nebulizer as instructed.       Plan:  Follow-up:  Patient agrees to Care Plan and Follow-up.  Will send education regarding COPD management.  Will notify PCP of [facility] involvement.  Will follow up with sister within the next 2 weeks.  [name], [place], MSN [place] [phone]

## 2020-08-07 NOTE — Patient Instructions (Signed)
Eating Plan for Chronic Obstructive Pulmonary Disease Chronic obstructive pulmonary disease (COPD) causes symptoms such as shortness of breath, coughing, and chest discomfort. These symptoms can make it difficult to eat enough to maintain a healthy weight. Generally, people with COPD should eat a diet that is high in calories, protein, and other nutrients to maintain body weight and to keep the lungs as healthy as possible. Depending on the medicines you take and other health conditions you may have, your health care provider may give you additional recommendations on what to eat or avoid. Talk with your health care provider about your goals for body weight, and work with a dietitian to develop an eating plan that is right for you. What are tips for following this plan? Reading food labels  Avoid foods with more than 300 milligrams (mg) of salt (sodium) per serving.  Choose foods that contain at least 4 grams (g) of fiber per serving. Try to eat 20-30 g of fiber each day.  Choose foods that are high in calories and protein, such as nuts, beans, yogurt, and cheese.   Shopping  Do not buy foods labeled as diet, low-calorie, or low-fat.  If you are able to eat dairy products: ? Avoid low-fat or skim milk. ? Buy dairy products that have at least 2% fat.  Buy nutritional supplement drinks.  Buy grains and prepared foods labeled as enriched or fortified.  Consider buying low-sodium, pre-made foods to conserve energy for eating. Cooking  Add dry milk or protein powder to smoothies.  Cook with healthy fats, such as olive oil, canola oil, sunflower oil, and grapeseed oil.  Add oil, butter, cream cheese, or nut butters to foods to increase fat and calories.  To make foods easier to chew and swallow: ? Cook vegetables, pasta, and rice until soft. ? Cut or grind meat into very small pieces. ? Dip breads in liquid. Meal planning  Eat when you feel hungry.  Eat 5-6 small meals throughout  the day.  Drink 6-8 glasses of water each day.  Do not drink liquids with meals. Drink liquids at the end of the meal to avoid feeling full too quickly.  Eat a variety of fruits and vegetables every day.  Ask for assistance from family or friends with planning and preparing meals as needed.  Avoid foods that cause you to feel bloated, such as carbonated drinks, fried foods, beans, broccoli, cabbage, and apples.  For older adults, ask your local agency on aging whether you are eligible for meal assistance programs, such as Meals on Wheels.   Lifestyle  Do not smoke.  Eat slowly. Take small bites and chew food well before swallowing.  Do not overeat. This may make it more difficult to breathe after eating.  Sit up while eating.  If needed, continue to use supplemental oxygen while eating.  Rest or relax for 30 minutes before and after eating.  Monitor your weight as told by your health care provider.  Exercise as told by your health care provider.   What foods should I eat? Fruits All fresh, dried, canned, or frozen fruits that do not cause gas. Vegetables All fresh, canned (no salt added), or frozen vegetables that do not cause gas. Grains Whole-grain bread. Enriched whole-grain pasta. Fortified whole-grain cereals. Fortified rice. Quinoa. Meats and other proteins Lean meat. Poultry. Fish. Dried beans. Unsalted nuts. Tofu. Eggs. Nut butters. Dairy Whole or 2% milk. Cheese. Yogurt. Fats and oils Olive oil. Canola oil. Butter. Margarine. Beverages Water. Vegetable  juice (no salt added). Decaffeinated coffee. Decaffeinated or herbal tea. Seasonings and condiments Fresh or dried herbs. Low-salt or salt-free seasonings. Low-sodium soy sauce. The items listed above may not be a complete list of foods and beverages you can eat. Contact a dietitian for more information. What foods should I avoid? Fruits Fruits that cause gas, such as apples or melon. Vegetables Vegetables  that cause gas, such as broccoli, Brussels sprouts, cabbage, cauliflower, and onions. Canned vegetables with added salt. Meats and other proteins Fried meat. Salt-cured meat. Processed meat. Dairy Fat-free or low-fat milk, yogurt, or cheese. Processed cheese. Beverages Carbonated drinks. Caffeinated drinks, such as coffee, tea, and soft drinks. Juice. Alcohol. Vegetable juice with added salt. Seasonings and condiments Salt. Seasoning mixes with salt. Soy sauce. Blake Mcknight. Other foods Clear soup or broth. Fried foods. Prepared frozen meals. The items listed above may not be a complete list of foods and beverages you should avoid. Contact a dietitian for more information. Summary  COPD symptoms can make it difficult to eat enough to maintain a healthy weight.  A COPD eating plan can help you maintain your body weight and keep your lungs as healthy as possible.  Eat a diet that is high in calories, protein, and other nutrients. Read labels to make sure that you are getting the right nutrients. Cook foods to make them easier to chew and swallow.  Eat 5-6 small meals throughout the day, and avoid foods that cause gas or make you feel bloated. This information is not intended to replace advice given to you by your health care provider. Make sure you discuss any questions you have with your health care provider. Document Revised: 01/10/2020 Document Reviewed: 01/10/2020 Elsevier Patient Education  2021 [employer]. Chronic Obstructive Pulmonary Disease  Chronic obstructive pulmonary disease (COPD) is a long-term (chronic) lung problem. When you have COPD, it is hard for air to get in and out of your lungs. Usually the condition gets worse over time, and your lungs will never return to normal. There are things you can do to keep yourself as healthy as possible. What are the causes?  Smoking. This is the most common cause.  Certain genes passed from parent to child (inherited). What increases  the risk?  Being exposed to secondhand smoke from cigarettes, pipes, or cigars.  Being exposed to chemicals and other irritants, such as fumes and dust in the work environment.  Having chronic lung conditions or infections. What are the signs or symptoms?  Shortness of breath, especially during physical activity.  A long-term cough with a large amount of thick mucus. Sometimes, the cough may not have any mucus (dry cough).  Wheezing.  Breathing quickly.  Skin that looks gray or blue, especially in the fingers, toes, or lips.  Feeling tired (fatigue).  Weight loss.  Chest tightness.  Having infections often.  Episodes when breathing symptoms become much worse (exacerbations). At the later stages of this disease, you may have swelling in the ankles, feet, or legs. How is this treated?  Taking medicines.  Quitting smoking, if you smoke.  Rehabilitation. This includes steps to make your body work better. It may involve a team of specialists.  Doing exercises.  Making changes to your diet.  Using oxygen.  Lung surgery.  Lung transplant.  Comfort measures (palliative care). Follow these instructions at home: Medicines  Take over-the-counter and prescription medicines only as told by your doctor.  Talk to your doctor before taking any cough or allergy medicines. You may  need to avoid medicines that cause your lungs to be dry. Lifestyle  If you smoke, stop smoking. Smoking makes the problem worse.  Do not smoke or use any products that contain nicotine or tobacco. If you need help quitting, ask your doctor.  Avoid being around things that make your breathing worse. This may include smoke, chemicals, and fumes.  Stay active, but remember to rest as well.  Learn and use tips on how to manage stress and control your breathing.  Make sure you get enough sleep. Most adults need at least 7 hours of sleep every night.  Eat healthy foods. Eat smaller meals more  often. Rest before meals. Controlled breathing Learn and use tips on how to control your breathing as told by your doctor. Try:  Breathing in (inhaling) through your nose for 1 second. Then, pucker your lips and breath out (exhale) through your lips for 2 seconds.  Putting one hand on your belly (abdomen). Breathe in slowly through your nose for 1 second. Your hand on your belly should move out. Pucker your lips and breathe out slowly through your lips. Your hand on your belly should move in as you breathe out.   Controlled coughing Learn and use controlled coughing to clear mucus from your lungs. Follow these steps: 1. Lean your head a little forward. 2. Breathe in deeply. 3. Try to hold your breath for 3 seconds. 4. Keep your mouth slightly open while coughing 2 times. 5. Spit any mucus out into a tissue. 6. Rest and do the steps again 1 or 2 times as needed. General instructions  Make sure you get all the shots (vaccines) that your doctor recommends. Ask your doctor about a flu shot and a pneumonia shot.  Use oxygen therapy and pulmonary rehabilitation if told by your doctor. If you need home oxygen therapy, ask your doctor if you should buy a tool to measure your oxygen level (oximeter).  Make a COPD action plan with your doctor. This helps you to know what to do if you feel worse than usual.  Manage any other conditions you have as told by your doctor.  Avoid going outside when it is very hot, cold, or humid.  Avoid people who have a sickness you can catch (contagious).  Keep all follow-up visits. Contact a doctor if:  You cough up more mucus than usual.  There is a change in the color or thickness of the mucus.  It is harder to breathe than usual.  Your breathing is faster than usual.  You have trouble sleeping.  You need to use your medicines more often than usual.  You have trouble doing your normal activities such as getting dressed or walking around the  house. Get help right away if:  You have shortness of breath while resting.  You have shortness of breath that stops you from: ? Being able to talk. ? Doing normal activities.  Your chest hurts for longer than 5 minutes.  Your skin color is more blue than usual.  Your pulse oximeter shows that you have low oxygen for longer than 5 minutes.  You have a fever.  You feel too tired to breathe normally. These symptoms may represent a serious problem that is an emergency. Do not wait to see if the symptoms will go away. Get medical help right away. Call your local emergency services (911 in the [place] Do not drive yourself to the hospital. Summary  Chronic obstructive pulmonary disease (COPD) is a long-term  lung problem.  The way your lungs work will never return to normal. Usually the condition gets worse over time. There are things you can do to keep yourself as healthy as possible.  Take over-the-counter and prescription medicines only as told by your doctor.  If you smoke, stop. Smoking makes the problem worse. This information is not intended to replace advice given to you by your health care provider. Make sure you discuss any questions you have with your health care provider. Document Revised: 01/10/2020 Document Reviewed: 01/10/2020 Elsevier Patient Education  2021 [employer].

## 2020-08-07 NOTE — Patient Outreach (Signed)
[facility] [place] [facility]) Care Management  08/07/2020  DONALDO TEEGARDEN April 08, 1955 [id number]   Referral Date: 5/23 Referral Source: Health Coach Referral Reason: Admitted to hospital for COPD Insurance: [employer] attempt #1, unsuccessful to sister who is POA and caregiver.  HIPAA compliant voice message left.    Plan: RN CM will send outreach letter and follow up within the next 3-4 business days.  [name], [place], MSN [place] [phone]

## 2020-08-21 ENCOUNTER — Other Ambulatory Visit: Payer: Self-pay | Admitting: *Deleted

## 2020-08-21 DIAGNOSIS — E559 Vitamin D deficiency, unspecified: Secondary | ICD-10-CM | POA: Diagnosis not present

## 2020-08-21 DIAGNOSIS — J441 Chronic obstructive pulmonary disease with (acute) exacerbation: Secondary | ICD-10-CM | POA: Diagnosis not present

## 2020-08-21 DIAGNOSIS — Z79899 Other long term (current) drug therapy: Secondary | ICD-10-CM | POA: Diagnosis not present

## 2020-08-21 DIAGNOSIS — K2100 Gastro-esophageal reflux disease with esophagitis, without bleeding: Secondary | ICD-10-CM | POA: Diagnosis not present

## 2020-08-21 DIAGNOSIS — I119 Hypertensive heart disease without heart failure: Secondary | ICD-10-CM | POA: Diagnosis not present

## 2020-08-21 DIAGNOSIS — R7302 Impaired glucose tolerance (oral): Secondary | ICD-10-CM | POA: Diagnosis not present

## 2020-08-21 DIAGNOSIS — E7800 Pure hypercholesterolemia, unspecified: Secondary | ICD-10-CM | POA: Diagnosis not present

## 2020-08-21 DIAGNOSIS — M159 Polyosteoarthritis, unspecified: Secondary | ICD-10-CM | POA: Diagnosis not present

## 2020-08-21 DIAGNOSIS — K267 Chronic duodenal ulcer without hemorrhage or perforation: Secondary | ICD-10-CM | POA: Diagnosis not present

## 2020-08-21 DIAGNOSIS — F319 Bipolar disorder, unspecified: Secondary | ICD-10-CM | POA: Diagnosis not present

## 2020-08-21 DIAGNOSIS — M2550 Pain in unspecified joint: Secondary | ICD-10-CM | POA: Diagnosis not present

## 2020-08-21 DIAGNOSIS — N401 Enlarged prostate with lower urinary tract symptoms: Secondary | ICD-10-CM | POA: Diagnosis not present

## 2020-08-21 NOTE — Patient Outreach (Signed)
[place] [facility]) Care Management  08/21/2020  Blake Mcknight 03-Nov-1955 [id number]   Outgoing call placed to member's sister, state he is doing well.  Denies any urgent concerns, encouraged to contact this care manager with questions.  Agrees to follow up within the next month.  Goals Addressed            This Visit's Progress   . [facility] - Make and Keep All Appointments   On track    Follow Up Date 11/30  Timeframe:  Short-Term Goal Priority:  High Start Date:           5/24                  Expected End Date:  6/24  Barriers: Health Behaviors                        - ask family or friend for a ride - keep a calendar with appointment dates    Why is this important?   Part of staying healthy is seeing the doctor for follow-up care.  If you forget your appointments, there are some things you can do to stay on track.    Notes:   11/11 - Sister will call to schedule PCP appointment   5/24 - Patient admitted to hospital for COPD exacerbation 5/19-5/21.  Sister will call PCP to schedule appointment today  6/7 - Sister report follow up appointment with PCP today.  Will also make appointments with urology and eye doctor    . [facility] - Symptom Exacerbation Prevented or Minimized   On track    Timeframe:  Long-Range Goal Priority:  High Start Date:      [id number]                       Expected End Date:     [id number]                 Follow up date: 6/7  Barriers: Knowledge   Notes: 12/13 - Long term COPD management discussed with sister, discussed management of inhalers and nebulizer [phone] Patient is still smoking randomly per sister not every day and just taking a puff.    5/24 - Patient admitted to hospital for COPD exacerbation 5/19-5/21.  Reviewed discharge instructions with sister, report he is taking meds and using inhaler/nebulizer as instructed.  6/7 - Per sister, patient currently denies any shortness of breath, will continue medication  management as prescribed      [name], RN, MSN [place] [phone]

## 2020-09-02 ENCOUNTER — Inpatient Hospital Stay (HOSPITAL_COMMUNITY)
Admission: EM | Admit: 2020-09-02 | Discharge: 2020-09-05 | DRG: 190 | Disposition: A | Discharge status: Home / Self Care | Payer: Medicare Other | Attending: Family Medicine | Admitting: Family Medicine

## 2020-09-02 ENCOUNTER — Other Ambulatory Visit: Payer: Self-pay

## 2020-09-02 ENCOUNTER — Encounter (HOSPITAL_COMMUNITY): Payer: Self-pay

## 2020-09-02 ENCOUNTER — Emergency Department (HOSPITAL_COMMUNITY): Payer: Medicare Other

## 2020-09-02 DIAGNOSIS — F209 Schizophrenia, unspecified: Secondary | ICD-10-CM | POA: Diagnosis present

## 2020-09-02 DIAGNOSIS — Z79899 Other long term (current) drug therapy: Secondary | ICD-10-CM

## 2020-09-02 DIAGNOSIS — R0602 Shortness of breath: Secondary | ICD-10-CM | POA: Diagnosis not present

## 2020-09-02 DIAGNOSIS — Z7952 Long term (current) use of systemic steroids: Secondary | ICD-10-CM

## 2020-09-02 DIAGNOSIS — J9601 Acute respiratory failure with hypoxia: Secondary | ICD-10-CM | POA: Diagnosis not present

## 2020-09-02 DIAGNOSIS — J9621 Acute and chronic respiratory failure with hypoxia: Secondary | ICD-10-CM | POA: Diagnosis not present

## 2020-09-02 DIAGNOSIS — Z823 Family history of stroke: Secondary | ICD-10-CM

## 2020-09-02 DIAGNOSIS — Z9981 Dependence on supplemental oxygen: Secondary | ICD-10-CM | POA: Diagnosis not present

## 2020-09-02 DIAGNOSIS — R0902 Hypoxemia: Secondary | ICD-10-CM | POA: Diagnosis not present

## 2020-09-02 DIAGNOSIS — Z87891 Personal history of nicotine dependence: Secondary | ICD-10-CM

## 2020-09-02 DIAGNOSIS — Z66 Do not resuscitate: Secondary | ICD-10-CM | POA: Diagnosis not present

## 2020-09-02 DIAGNOSIS — Z20822 Contact with and (suspected) exposure to covid-19: Secondary | ICD-10-CM | POA: Diagnosis present

## 2020-09-02 DIAGNOSIS — N400 Enlarged prostate without lower urinary tract symptoms: Secondary | ICD-10-CM | POA: Diagnosis present

## 2020-09-02 DIAGNOSIS — J439 Emphysema, unspecified: Secondary | ICD-10-CM | POA: Diagnosis present

## 2020-09-02 DIAGNOSIS — Z833 Family history of diabetes mellitus: Secondary | ICD-10-CM

## 2020-09-02 DIAGNOSIS — J441 Chronic obstructive pulmonary disease with (acute) exacerbation: Secondary | ICD-10-CM | POA: Diagnosis not present

## 2020-09-02 DIAGNOSIS — I10 Essential (primary) hypertension: Secondary | ICD-10-CM | POA: Diagnosis present

## 2020-09-02 DIAGNOSIS — Z8249 Family history of ischemic heart disease and other diseases of the circulatory system: Secondary | ICD-10-CM

## 2020-09-02 DIAGNOSIS — R000 Tachycardia, unspecified: Secondary | ICD-10-CM | POA: Diagnosis not present

## 2020-09-02 DIAGNOSIS — J309 Allergic rhinitis, unspecified: Secondary | ICD-10-CM | POA: Diagnosis present

## 2020-09-02 DIAGNOSIS — J9611 Chronic respiratory failure with hypoxia: Secondary | ICD-10-CM

## 2020-09-02 DIAGNOSIS — J80 Acute respiratory distress syndrome: Secondary | ICD-10-CM | POA: Diagnosis not present

## 2020-09-02 DIAGNOSIS — R062 Wheezing: Secondary | ICD-10-CM | POA: Diagnosis not present

## 2020-09-02 DIAGNOSIS — E785 Hyperlipidemia, unspecified: Secondary | ICD-10-CM | POA: Diagnosis present

## 2020-09-02 LAB — BASIC METABOLIC PANEL
Anion gap: 7 (ref 5–15)
BUN: 6 mg/dL — ABNORMAL LOW (ref 8–23)
CO2: 26 mmol/L (ref 22–32)
Calcium: 8.8 mg/dL — ABNORMAL LOW (ref 8.9–10.3)
Chloride: 99 mmol/L (ref 98–111)
Creatinine, Ser: 0.78 mg/dL (ref 0.61–1.24)
GFR, Estimated: >60 mL/min (ref >60)
Glucose, Bld: 103 mg/dL — ABNORMAL HIGH (ref 70–99)
Potassium: 4.2 mmol/L (ref 3.5–5.1)
Sodium: 132 mmol/L — ABNORMAL LOW (ref 135–145)

## 2020-09-02 LAB — RESP PANEL BY RT-PCR (FLU A&B, COVID) ARPGX2
Influenza A by PCR: NEGATIVE
Influenza B by PCR: NEGATIVE
SARS Coronavirus 2 by RT PCR: NEGATIVE

## 2020-09-02 LAB — CBC WITH DIFFERENTIAL/PLATELET
Abs Immature Granulocytes: 0.02 10*3/uL (ref 0.00–0.07)
Basophils Absolute: 0.1 10*3/uL (ref 0.0–0.1)
Basophils Relative: 1 %
Eosinophils Absolute: 0.6 10*3/uL — ABNORMAL HIGH (ref 0.0–0.5)
Eosinophils Relative: 6 %
HCT: 44 % (ref 39.0–52.0)
Hemoglobin: 14.7 g/dL (ref 13.0–17.0)
Immature Granulocytes: 0 %
Lymphocytes Relative: 32 %
Lymphs Abs: 2.8 10*3/uL (ref 0.7–4.0)
MCH: 30.2 pg (ref 26.0–34.0)
MCHC: 33.4 g/dL (ref 30.0–36.0)
MCV: 90.5 fL (ref 80.0–100.0)
Monocytes Absolute: 1 10*3/uL (ref 0.1–1.0)
Monocytes Relative: 11 %
Neutro Abs: 4.4 10*3/uL (ref 1.7–7.7)
Neutrophils Relative %: 50 %
Platelets: 238 10*3/uL (ref 150–400)
RBC: 4.86 MIL/uL (ref 4.22–5.81)
RDW: 12.8 % (ref 11.5–15.5)
WBC: 8.8 10*3/uL (ref 4.0–10.5)
nRBC: 0 % (ref 0.0–0.2)

## 2020-09-02 MED ORDER — ALBUTEROL SULFATE (2.5 MG/3ML) 0.083% IN NEBU
2.5000 mg | INHALATION_SOLUTION | RESPIRATORY_TRACT | Status: DC | PRN
  Administered 2020-09-02 – 2020-09-04 (×2): 2.5 mg via RESPIRATORY_TRACT
  Filled 2020-09-02 (×2): qty 3

## 2020-09-02 MED ORDER — BISACODYL 5 MG PO TBEC: 5.0000 mg | DELAYED_RELEASE_TABLET | Freq: Every day | ORAL | Status: DC | PRN

## 2020-09-02 MED ORDER — ACETAMINOPHEN 650 MG RE SUPP: 650.0000 mg | Freq: Four times a day (QID) | RECTAL | Status: DC | PRN

## 2020-09-02 MED ORDER — ALBUTEROL (5 MG/ML) CONTINUOUS INHALATION SOLN
10.0000 mg/h | INHALATION_SOLUTION | Freq: Once | RESPIRATORY_TRACT | Status: AC
  Administered 2020-09-02: 10 mg/h via RESPIRATORY_TRACT
  Filled 2020-09-02: qty 20

## 2020-09-02 MED ORDER — ENALAPRIL MALEATE 5 MG PO TABS
10.0000 mg | ORAL_TABLET | Freq: Every day | ORAL | Status: DC
  Administered 2020-09-03 – 2020-09-05 (×3): 10 mg via ORAL
  Filled 2020-09-02 (×3): qty 2

## 2020-09-02 MED ORDER — RISPERIDONE 3 MG PO TABS
3.0000 mg | ORAL_TABLET | Freq: Two times a day (BID) | ORAL | Status: DC
  Administered 2020-09-03 – 2020-09-05 (×6): 3 mg via ORAL
  Filled 2020-09-02 (×8): qty 1

## 2020-09-02 MED ORDER — DM-GUAIFENESIN ER 30-600 MG PO TB12
1.0000 | ORAL_TABLET | Freq: Two times a day (BID) | ORAL | Status: DC
  Administered 2020-09-02 – 2020-09-05 (×6): 1 via ORAL
  Filled 2020-09-02 (×7): qty 1

## 2020-09-02 MED ORDER — LORATADINE 10 MG PO TABS
10.0000 mg | ORAL_TABLET | Freq: Every day | ORAL | Status: DC
  Administered 2020-09-03 – 2020-09-05 (×3): 10 mg via ORAL
  Filled 2020-09-02 (×3): qty 1

## 2020-09-02 MED ORDER — CARVEDILOL 3.125 MG PO TABS
3.1250 mg | ORAL_TABLET | Freq: Two times a day (BID) | ORAL | Status: DC
  Administered 2020-09-03 – 2020-09-05 (×5): 3.125 mg via ORAL
  Filled 2020-09-02 (×5): qty 1

## 2020-09-02 MED ORDER — MIRTAZAPINE 15 MG PO TABS
30.0000 mg | ORAL_TABLET | Freq: Every day | ORAL | Status: DC
  Administered 2020-09-02 – 2020-09-04 (×3): 30 mg via ORAL
  Filled 2020-09-02 (×2): qty 2
  Filled 2020-09-02: qty 1
  Filled 2020-09-02: qty 2

## 2020-09-02 MED ORDER — PREDNISONE 20 MG PO TABS
40.0000 mg | ORAL_TABLET | Freq: Every day | ORAL | Status: DC
  Administered 2020-09-03: 40 mg via ORAL
  Filled 2020-09-02: qty 2

## 2020-09-02 MED ORDER — ONDANSETRON HCL 4 MG PO TABS: 4.0000 mg | ORAL_TABLET | Freq: Four times a day (QID) | ORAL | Status: DC | PRN

## 2020-09-02 MED ORDER — MOMETASONE FURO-FORMOTEROL FUM 200-5 MCG/ACT IN AERO
2.0000 | INHALATION_SPRAY | Freq: Two times a day (BID) | RESPIRATORY_TRACT | Status: DC
  Administered 2020-09-03 – 2020-09-05 (×5): 2 via RESPIRATORY_TRACT
  Filled 2020-09-02 (×2): qty 8.8

## 2020-09-02 MED ORDER — AZITHROMYCIN 250 MG PO TABS
500.0000 mg | ORAL_TABLET | Freq: Every day | ORAL | Status: DC
  Administered 2020-09-02 – 2020-09-05 (×4): 500 mg via ORAL
  Filled 2020-09-02 (×4): qty 2

## 2020-09-02 MED ORDER — ATORVASTATIN CALCIUM 10 MG PO TABS
10.0000 mg | ORAL_TABLET | Freq: Every day | ORAL | Status: DC
  Administered 2020-09-02 – 2020-09-04 (×3): 10 mg via ORAL
  Filled 2020-09-02 (×3): qty 1

## 2020-09-02 MED ORDER — SENNOSIDES-DOCUSATE SODIUM 8.6-50 MG PO TABS
1.0000 | ORAL_TABLET | Freq: Every evening | ORAL | Status: DC | PRN
  Administered 2020-09-04: 1 via ORAL
  Filled 2020-09-02: qty 1

## 2020-09-02 MED ORDER — ENOXAPARIN SODIUM 40 MG/0.4ML IJ SOSY
40.0000 mg | PREFILLED_SYRINGE | INTRAMUSCULAR | Status: DC
  Administered 2020-09-02 – 2020-09-04 (×3): 40 mg via SUBCUTANEOUS
  Filled 2020-09-02 (×5): qty 0.4

## 2020-09-02 MED ORDER — ACETAMINOPHEN 325 MG PO TABS
650.0000 mg | ORAL_TABLET | Freq: Four times a day (QID) | ORAL | Status: DC | PRN
  Administered 2020-09-03: 650 mg via ORAL
  Filled 2020-09-02: qty 2

## 2020-09-02 MED ORDER — FAMOTIDINE 20 MG PO TABS
40.0000 mg | ORAL_TABLET | Freq: Two times a day (BID) | ORAL | Status: DC
  Administered 2020-09-02 – 2020-09-05 (×6): 40 mg via ORAL
  Filled 2020-09-02 (×6): qty 2

## 2020-09-02 MED ORDER — TAMSULOSIN HCL 0.4 MG PO CAPS
0.4000 mg | ORAL_CAPSULE | Freq: Every day | ORAL | Status: DC
  Administered 2020-09-02 – 2020-09-04 (×3): 0.4 mg via ORAL
  Filled 2020-09-02 (×3): qty 1

## 2020-09-02 MED ORDER — IPRATROPIUM-ALBUTEROL 0.5-2.5 (3) MG/3ML IN SOLN
3.0000 mL | Freq: Four times a day (QID) | RESPIRATORY_TRACT | Status: DC
  Administered 2020-09-02 – 2020-09-03 (×4): 3 mL via RESPIRATORY_TRACT
  Filled 2020-09-02 (×4): qty 3
  Filled 2020-09-02: qty 9

## 2020-09-02 MED ORDER — SALINE SPRAY 0.65 % NA SOLN
1.0000 | NASAL | Status: DC | PRN
  Administered 2020-09-03 (×2): 1 via NASAL
  Filled 2020-09-02: qty 44

## 2020-09-02 MED ORDER — MONTELUKAST SODIUM 10 MG PO TABS: 10.0000 mg | ORAL_TABLET | Freq: Every day | ORAL | Status: DC

## 2020-09-02 MED ORDER — METHYLPREDNISOLONE SODIUM SUCC 125 MG IJ SOLR
60.0000 mg | Freq: Three times a day (TID) | INTRAMUSCULAR | Status: AC
  Administered 2020-09-02 – 2020-09-03 (×3): 60 mg via INTRAVENOUS
  Filled 2020-09-02 (×3): qty 2

## 2020-09-02 MED ORDER — ONDANSETRON HCL 4 MG/2ML IJ SOLN: 4.0000 mg | Freq: Four times a day (QID) | INTRAMUSCULAR | Status: DC | PRN

## 2020-09-02 NOTE — H&P (Signed)
History and Physical    Blake Mcknight TTS:[id number] DOB: 11-30-1955 DOA: 09/02/2020  PCP: [name], MD  Patient coming from: home  I have personally briefly reviewed patient's old medical records in [place]  Chief Complaint: severe shortness of breath  HPI: Blake Mcknight is a 64 y.o. male with medical history significant of severe COPD with recurrent admissions for exacerbations, chronic respiratory failure with hypoxia on 2 L/min oxygen, schizophrenia, hyperlipidemia, hypertension presented to the ED today with severe progressive shortness of breath that started earlier.  Had been having increased wheezing for a few days got progressively worse.  Wife called 56 and when EMS arrived they found patient to be in severe respiratory distress with oxygen sat 86% on his usual 2 L/min oxygen.  He denies any fevers chills, cough or chest pain, nausea or vomiting or diarrhea.  Reports good appetite, no recent illnesses.  Patient was admitted 1 month ago for same.  EMS treated patient with IV steroids and magnesium multiple neb treatments without much improvement.  He reportedly became extremely dyspneic with transferring to the stretcher for EMS and was tripoding.  ED Course: Initially requiring 10 L/min oxygen, weaned to 6 L/min.  Afebrile, heart rate 90s, respirations 16-19.  He was given 1 hour continuous albuterol neb treatment.  Subsequently able to be weaned to his baseline 2 L oxygen requirement however continues to be extremely dyspneic with even minimal exertion and very poor aeration.  Admitting for observation and further management of acute exacerbation of COPD.  Review of Systems: As per HPI otherwise 10 point review of systems negative.    Past Medical History:  Diagnosis Date   COPD (chronic obstructive pulmonary disease) ([place])    with bullous emphysema.    Heavy cigarette smoker before 2003   pt claims only 10 cigs per day, never heavier amounts.     HLD (hyperlipidemia)    Hypertension    Schizophrenia ([place]) 06/28/2013   This is a chronic condition and he lives with family    Past Surgical History:  Procedure Laterality Date   CHOLECYSTECTOMY N/A 06/06/2013   Procedure: LAPAROSCOPIC CHOLECYSTECTOMY WITH ATTEMPTED INTRAOPERATIVE CHOLANGIOGRAM;  Surgeon: Zenovia Jarred, MD;  Location: [facility];  Service: General;  Laterality: N/A;   ERCP N/A 06/03/2013   Procedure: ENDOSCOPIC RETROGRADE CHOLANGIOPANCREATOGRAPHY (ERCP);  Surgeon: Ladene Artist, MD;  Location: [facility] ENDOSCOPY;  Service: Endoscopy;  Laterality: N/A;   NO PAST SURGERIES       reports that he has been smoking cigarettes. He has a 41.00 pack-year smoking history. He has never used smokeless tobacco. He reports that he does not drink alcohol and does not use drugs.  No Known Allergies  Family History  Problem Relation Age of Onset   Diabetes Mellitus II Mother    Diabetes Mother    Heart disease Father    Stroke Maternal Aunt    CAD Neg Hx       Prior to Admission medications   Medication Sig Start Date End Date Taking? Authorizing Provider  albuterol (PROVENTIL) (2.5 MG/3ML) 0.083% nebulizer solution Take 3 mLs (2.5 mg total) by nebulization every 3 (three) hours as needed for wheezing or shortness of breath. 02/20/19   Aline August, MD  albuterol (VENTOLIN HFA) 108 (90 Base) MCG/ACT inhaler Inhale 2 puffs into the lungs every 4 (four) hours as needed for wheezing or shortness of breath. 02/20/19   Aline August, MD  Ascorbic Acid (VITAMIN C PO) Take 1 tablet by  mouth 3 (three) times a week.     [provider]  atorvastatin (LIPITOR) 10 MG tablet Take 10 mg by mouth daily at 6 PM.  09/21/15   [provider]  benztropine (COGENTIN) 2 MG tablet Take 1 tablet (2 mg total) by mouth 2 (two) times daily as needed for tremors. 07/26/20   Arfeen, Arlyce Harman, MD  BREZTRI AEROSPHERE 160-9-4.8 MCG/ACT AERO Inhale 2 puffs into the lungs in the morning and at bedtime.  07/18/20   [provider]  carvedilol (COREG) 3.125 MG tablet Take 1 tablet (3.125 mg total) by mouth 2 (two) times daily with a meal. Patient taking differently: Take 3.125 mg by mouth every morning. 02/20/19   Aline August, MD  cetirizine (ZYRTEC) 10 MG tablet Take 10 mg by mouth daily as needed for allergies or rhinitis.    [provider]  cholecalciferol (VITAMIN D) 25 MCG (1000 UNIT) tablet Take 4,000 Units by mouth every evening.     [provider]  Cyanocobalamin (B-12 PO) Take 1 tablet by mouth every other day.    [provider]  doxycycline (VIBRAMYCIN) 100 MG capsule Take 1 capsule (100 mg total) by mouth 2 (two) times daily. One po bid x 7 days 08/04/20   Barb Merino, MD  enalapril (VASOTEC) 10 MG tablet Take 10 mg by mouth daily. 06/04/16   [provider]  famotidine (PEPCID) 40 MG tablet Take 40 mg by mouth 2 (two) times daily. 12/28/18   [provider]  Fluticasone-Salmeterol (ADVAIR DISKUS) 250-50 MCG/DOSE AEPB Inhale 1 puff into the lungs 2 (two) times daily. 06/23/19 09/21/19  Harvie Heck, MD  guaiFENesin-dextromethorphan (ROBITUSSIN DM) 100-10 MG/5ML syrup Take 10 mLs by mouth every 4 (four) hours as needed for cough. 02/20/19   Aline August, MD  mirtazapine (REMERON) 30 MG tablet Take 1 tablet (30 mg total) by mouth at bedtime. 07/25/20 07/25/21  Arfeen, Arlyce Harman, MD  montelukast (SINGULAIR) 10 MG tablet Take 1 tablet (10 mg total) by mouth daily. 06/23/19 09/21/19  Harvie Heck, MD  multivitamin (ONE-A-DAY MEN'S) TABS tablet Take 1 tablet by mouth daily.    [provider]  OXYGEN Inhale 2 L/min into the lungs continuous.     [provider]  polyethylene glycol (MIRALAX / GLYCOLAX) 17 g packet Take 17 g by mouth daily as needed for mild constipation.    [provider]  predniSONE (DELTASONE) 10 MG tablet Take 10 mg by mouth 2 (two) times daily. 06/01/20   [provider]  risperiDONE  (RISPERDAL) 3 MG tablet Take 1 tablet (3 mg total) by mouth 2 (two) times daily. 07/25/20   Arfeen, Arlyce Harman, MD  sodium chloride (OCEAN) 0.65 % SOLN nasal spray Place 1 spray into both nostrils as needed for congestion.    [provider]  tamsulosin (FLOMAX) 0.4 MG CAPS capsule Take 0.4 mg by mouth at bedtime. 07/23/20   [provider]    Physical Exam: Vitals:   09/02/20 1348 09/02/20 1400 09/02/20 1415 09/02/20 1453  BP: (!) 144/95 139/88 139/80   Pulse: 96 96 98   Resp: 18 16 16    Temp:      TempSrc:      SpO2: 100% 100% 99% 99%  Weight:      Height:         Constitutional: NAD, calm, comfortable Eyes: EOMI, lids and conjunctivae normal ENMT: Mucous membranes are moist.  Poor dentition.  Hearing grossly normal Respiratory: Diffuse expiratory wheezes,  very diminished aeration, on 2 L/min nasal cannula oxygen no accessory muscle use while at rest.  Becomes dyspneic with sitting up in the bed. Cardiovascular: RRR, no murmurs / rubs / gallops. No extremity edema. 2+ pedal pulses.   Abdomen: soft, NT, ND, no masses or HSM palpated. +Bowel sounds.  Musculoskeletal: no clubbing / cyanosis. No joint deformity upper and lower extremities. Normal muscle tone.  Skin: dry, intact, normal color, normal temperature Neurologic: CN 2-12 grossly intact. Normal speech.  Grossly non-focal exam. Psychiatric: Alert and oriented x 3. Normal mood.  Flat affect.  Normal judgement and insight.   Labs on Admission: I have personally reviewed following labs and imaging studies  CBC: Recent Labs  Lab 09/02/20 1344  WBC 8.8  NEUTROABS 4.4  HGB 14.7  HCT 44.0  MCV 90.5  PLT 671   Basic Metabolic Panel: Recent Labs  Lab 09/02/20 1344  NA 132*  K 4.2  CL 99  CO2 26  GLUCOSE 103*  BUN 6*  CREATININE 0.78  CALCIUM 8.8*   GFR: Estimated Creatinine Clearance: 86.4 mL/min (by C-G formula based on SCr of 0.78 mg/dL). Liver Function Tests: No results for input(s): AST, ALT,  ALKPHOS, BILITOT, PROT, ALBUMIN in the last 168 hours. No results for input(s): LIPASE, AMYLASE in the last 168 hours. No results for input(s): AMMONIA in the last 168 hours. Coagulation Profile: No results for input(s): INR, PROTIME in the last 168 hours. Cardiac Enzymes: No results for input(s): CKTOTAL, CKMB, CKMBINDEX, TROPONINI in the last 168 hours. BNP (last 3 results) No results for input(s): PROBNP in the last 8760 hours. HbA1C: No results for input(s): HGBA1C in the last 72 hours. CBG: No results for input(s): GLUCAP in the last 168 hours. Lipid Profile: No results for input(s): CHOL, HDL, LDLCALC, TRIG, CHOLHDL, LDLDIRECT in the last 72 hours. Thyroid Function Tests: No results for input(s): TSH, T4TOTAL, FREET4, T3FREE, THYROIDAB in the last 72 hours. Anemia Panel: No results for input(s): VITAMINB12, FOLATE, FERRITIN, TIBC, IRON, RETICCTPCT in the last 72 hours. Urine analysis:    Component Value Date/Time   COLORURINE STRAW (A) 06/15/2020 1434   APPEARANCEUR CLEAR 06/15/2020 1434   LABSPEC 1.005 06/15/2020 1434   PHURINE 7.0 06/15/2020 1434   GLUCOSEU NEGATIVE 06/15/2020 1434   HGBUR NEGATIVE 06/15/2020 [place] 06/15/2020 1434   KETONESUR NEGATIVE 06/15/2020 1434   PROTEINUR NEGATIVE 06/15/2020 1434   UROBILINOGEN 0.2 01/29/2019 1634   NITRITE NEGATIVE 06/15/2020 1434   LEUKOCYTESUR NEGATIVE 06/15/2020 1434    Radiological Exams on Admission: DG Chest Portable 1 View  Result Date: 09/02/2020 CLINICAL DATA:  Hypoxia. EXAM: PORTABLE CHEST 1 VIEW COMPARISON:  Aug 02, 2020 FINDINGS: Cardiomediastinal silhouette is normal. Mediastinal contours appear intact. There is no evidence of focal airspace consolidation, pleural effusion or pneumothorax. Severe emphysematous changes with large upper lobe predominant bullous formation. Osseous structures are without acute abnormality. Soft tissues are grossly normal. IMPRESSION: Severe emphysematous changes  with large upper lobe predominant bullous formation. No evidence of focal consolidation. Electronically Signed   By: Fidela Salisbury M.D.   On: 09/02/2020 14:21    EKG: Independently reviewed.  Normal sinus rhythm, 96 bpm, PVCs,, no acute ischemic changes  Assessment/Plan Active Problems:   COPD with acute exacerbation (HCC)    Severe COPD with acute exacerbation -with admission for same a month ago.  Despite EMS treated with high-dose IV steroid, IV magnesium neb treatments in addition to 1 hour continuous albuterol neb treatment in the  ED, patient continues to have diffuse expiratory wheezing, very diminished aeration and severe dyspnea with even minimal exertion. --Admit for observation --Continue IV steroids, transition to prednisone to complete 5-day course, per orders --Ruthe Mannan, scheduled DuoNebs, as needed albuterol nebs --Azithromycin  --Mucinex  Acute on chronic respiratory failure with hypoxia -initially required 6 to 10 L/min but was able to be weaned to his baseline after 1 hour neb treatment.  This was associated with significant respiratory distress in the setting of above. --Supplemental oxygen to maintain sats 88 to 93%  Hypertension -continue home enalapril and Coreg pending med history  Hyperlipidemia -continue statin   History of schizophrenia -continue home medications pending med history  Allergic rhinitis -continue Singulair and loratadine (for Claritin)  BPH -continue Flomax  DVT prophylaxis: enoxaparin (LOVENOX) injection 30 mg Start: 09/02/20 1645   Code Status: DNR Family Communication: None present during encounter Disposition Plan: Anticipate discharge home in 24 to 48 hours Consults called: None  Admission status:  Status is: Observation  The patient remains OBS appropriate and will d/c before 2 midnights.  Dispo: The patient is from: Home              Anticipated d/c is to: Home              Patient currently is not medically stable to  d/c.   Difficult to place patient No         Ezekiel Slocumb, DO [facility]  09/02/2020, 4:43 PM    If 7PM-7AM, please contact night-coverage. How to contact the [facility] Attending or Consulting provider [place] or covering provider during after hours [place], for this patient?    Check the care team in [facility] and look for a) attending/consulting [facility] provider listed and b) the [facility] team listed Log into [email or web address] and use [place]'s universal password to access. If you do not have the password, please contact the hospital operator. Locate the [facility] provider you are looking for under [facility] and page to a number that you can be directly reached. If you still have difficulty reaching the provider, please page the [facility] (Director on Call) for the Hospitalists listed on amion for assistance.

## 2020-09-02 NOTE — Progress Notes (Signed)
Pt arrived to 6n22 by stretcher  from ED with caregiver(sister- Gwynn).Pt ambulated several steps to bed- unsteady. Will continue to monitor. No verbal complaints or resp distress at this time.

## 2020-09-02 NOTE — ED Provider Notes (Signed)
Medical screening examination/treatment/procedure(s) were conducted as a shared visit with non-physician practitioner(s) and myself.  I personally evaluated the patient during the encounter.  Clinical Impression:   Final diagnoses:  Acute respiratory failure with hypoxia ([place])    65 year old male with COPD, presents hypoxic and respiratory distress with expiratory wheezing, prolonged expiratory phase, he speaks in several word sentences, he is dyspneic, he was severely dyspneic on exertion prehospital but appears improved after multiple treatments prehospital, steroids, magnesium and now receiving continuous nebs.  He will need multiple treatments and will likely need to be admitted to the hospital for ongoing respiratory distress.   X-ray shows severe emphysematous changes, there is bullous formation in the upper lobes, he has no leukocytosis.    EKG Interpretation  Date/Time:  Sunday September 02 2020 13:37:21 EDT Ventricular Rate:  96 PR Interval:  146 QRS Duration: 83 QT Interval:  331 QTC Calculation: 421 R Axis:   95 Text Interpretation: Sinus rhythm Multiple ventricular premature complexes Aberrant conduction of SV complex(es) Right axis deviation Probable anteroseptal infarct, old Nonspecific T abnormalities, lateral leads Confirmed by Noemi Chapel [phone]) on 09/02/2020 1:53:34 PM         .Critical Care  Date/Time: 09/02/2020 3:10 PM Performed by: Noemi Chapel, MD Authorized by: Noemi Chapel, MD   Critical care provider statement:    Critical care time (minutes):  35   Critical care time was exclusive of:  Separately billable procedures and treating other patients and teaching time   Critical care was necessary to treat or prevent imminent or life-threatening deterioration of the following conditions:  Respiratory failure   Critical care was time spent personally by me on the following activities:  Blood draw for specimens, development of treatment plan with patient or  surrogate, discussions with consultants, evaluation of patient's response to treatment, examination of patient, obtaining history from patient or surrogate, ordering and performing treatments and interventions, ordering and review of laboratory studies, ordering and review of radiographic studies, pulse oximetry, re-evaluation of patient's condition and review of old charts     Noemi Chapel, MD 09/04/20 [phone]

## 2020-09-02 NOTE — ED Provider Notes (Signed)
Care assumed from PA [facility] at shift change, please see her note for full details, but in brief Blake Mcknight is a 65 y.o. male with hx of COPD presents with worsening shortness of breath and increased work of breathing. On 2L [place] chronically at home.  Tried nebulizer treatments at home without improvement.  Received DuoNeb, mag, Solu-Medrol with EMS with slight improvement.  Was initially satting 86% on home oxygen, requiring 6 L.  On arrival patient still extremely diminished with some increased work of breathing, started on 1 hour continuous albuterol nebulizer treatment.  Initial lab work and chest x-ray reassuring, signs of COPD but no pneumonia.  COVID test pending.  Plan: Reassess after CAT, anticipate patient will need admission.  Physical Exam  BP (!) 144/95 (BP Location: Right Arm)   Pulse 96   Temp (!) 97.3 F (36.3 C) (Oral)   Resp 18   Ht 5\' 6"  (1.676 m)   Wt 70.8 kg   SpO2 99%   BMI 25.18 kg/m   Physical Exam Vitals and nursing note reviewed.  Constitutional:      General: He is not in acute distress.    Appearance: Normal appearance. He is well-developed. He is not ill-appearing or diaphoretic.  HENT:     Head: Normocephalic and atraumatic.  Eyes:     General:        Right eye: No discharge.        Left eye: No discharge.  Cardiovascular:     Rate and Rhythm: Normal rate.  Pulmonary:     Effort: No respiratory distress.     Breath sounds: Decreased breath sounds and wheezing present.     Comments: Rest work of breathing is normal, but patient is very tight on exam with very minimal air movement and expiratory wheezing. Musculoskeletal:     Right lower leg: No edema.     Left lower leg: No edema.  Skin:    General: Skin is warm and dry.  Neurological:     Mental Status: He is alert and oriented to person, place, and time.     Coordination: Coordination normal.  Psychiatric:        Mood and Affect: Mood normal.        Behavior: Behavior  normal.    ED Course/Procedures   Labs Reviewed  CBC WITH DIFFERENTIAL/PLATELET - Abnormal; Notable for the following components:      Result Value   Eosinophils Absolute 0.6 (*)    All other components within normal limits  BASIC METABOLIC PANEL - Abnormal; Notable for the following components:   Sodium 132 (*)    Glucose, Bld 103 (*)    BUN 6 (*)    Calcium 8.8 (*)    All other components within normal limits  RESP PANEL BY RT-PCR (FLU A&B, COVID) [id number]   EKG Interpretation  Date/Time:  Sunday September 02 2020 13:37:21 EDT Ventricular Rate:  96 PR Interval:  146 QRS Duration: 83 QT Interval:  331 QTC Calculation: 421 R Axis:   95 Text Interpretation: Sinus rhythm Multiple ventricular premature complexes Aberrant conduction of SV complex(es) Right axis deviation Probable anteroseptal infarct, old Nonspecific T abnormalities, lateral leads Confirmed by Noemi Chapel [phone]) on 09/02/2020 1:53:34 PM  DG Chest Portable 1 View  Result Date: 09/02/2020 CLINICAL DATA:  Hypoxia. EXAM: PORTABLE CHEST 1 VIEW COMPARISON:  Aug 02, 2020 FINDINGS: Cardiomediastinal silhouette is normal. Mediastinal contours appear intact. There is no evidence of focal airspace consolidation, pleural effusion or  pneumothorax. Severe emphysematous changes with large upper lobe predominant bullous formation. Osseous structures are without acute abnormality. Soft tissues are grossly normal. IMPRESSION: Severe emphysematous changes with large upper lobe predominant bullous formation. No evidence of focal consolidation. Electronically Signed   By: Fidela Salisbury M.D.   On: 09/02/2020 14:21     .Critical Care  Date/Time: 09/02/2020 4:35 PM Performed by: Jacqlyn Larsen, PA-C Authorized by: Jacqlyn Larsen, PA-C   Critical care provider statement:    Critical care time (minutes):  45   Critical care was necessary to treat or prevent imminent or life-threatening deterioration of the following conditions:   Respiratory failure   Critical care was time spent personally by me on the following activities:  Discussions with consultants, evaluation of patient's response to treatment, examination of patient, ordering and performing treatments and interventions, ordering and review of laboratory studies, ordering and review of radiographic studies, pulse oximetry, re-evaluation of patient's condition, obtaining history from patient or surrogate and review of old charts  MDM   After continuous albuterol treatment patient's work of breathing at rest has improved but he is still extremely diminished with very little air movement, given improved work of breathing do not feel that he will need BiPAP right now, but will need admission for further treatment of COPD exacerbation.  Currently satting well on home 2 L O2.  Case discussed with Dr. Arbutus Ped with [facility] who will see and admit the patient.       Jacqlyn Larsen, PA-C 09/02/20 1636    Sherwood Gambler, MD 09/03/20 [phone]

## 2020-09-02 NOTE — ED Provider Notes (Signed)
[facility] EMERGENCY DEPARTMENT Provider Note   CSN: [id number] Arrival date & time: 09/02/20  1331     History Chief Complaint  Patient presents with   Shortness of Breath    Blake Mcknight is a 65 y.o. male with past medical history significant for COPD, tobacco abuse, hypertension, hyperlipidemia and schizophrenia.  Patient wears home oxygen 2 L nasal cannula.  HPI Patient presents to emergency room today via EMS with chief complaint of shortness of breath.  Patient states he has been wheezing for the last 3 days and today is significantly worsened.  He is having difficulty breathing so his wife called 911.  On EMS arrival patient was hypoxic to 86% on room air.  He had significant increased work of breathing and expiratory wheezing was heard.  He tried taking 2 albuterol treatments prior to EMS arrival without symptom improvement.  He was given another 3 by EMS and still had significant wheezing and hypoxia.  Patient was placed on nonrebreather.  He was given IV Solu-Medrol and magnesium.  EMS states that when patient stood up to transfer to the stretcher he again had significant increased work of breathing and was noted to be tripoding when sitting on the stretcher.  Symptoms improved after IV medications.  Patient denies any recent illness.  He denies any history of intubations in the past for COPD exacerbations.  He denies any fever, chills, cough, chest pain. Denies any known COVID exposure.       Past Medical History:  Diagnosis Date   COPD (chronic obstructive pulmonary disease) ([place])    with bullous emphysema.    Heavy cigarette smoker before 2003   pt claims only 10 cigs per day, never heavier amounts.    HLD (hyperlipidemia)    Hypertension    Schizophrenia ([place]) 06/28/2013   This is a chronic condition and he lives with family    Patient Active Problem List   Diagnosis Date Noted   Hypoxia    COPD exacerbation ([place]) 06/05/2019   Do not  resuscitate 02/18/2019   Bullous emphysema ([place]) 07/26/2013   Schizophrenia ([place]) 06/28/2013   Tobacco use disorder 06/06/2013   Abdominal pain 06/03/2013   HTN (hypertension) 06/03/2013   HLD (hyperlipidemia) 06/03/2013   Chronic obstructive pulmonary disease with acute exacerbation ([place]) 06/03/2013   Calculus of bile duct without mention of cholecystitis or obstruction 06/03/2013    Past Surgical History:  Procedure Laterality Date   CHOLECYSTECTOMY N/A 06/06/2013   Procedure: LAPAROSCOPIC CHOLECYSTECTOMY WITH ATTEMPTED INTRAOPERATIVE CHOLANGIOGRAM;  Surgeon: Zenovia Jarred, MD;  Location: [place];  Service: General;  Laterality: N/A;   ERCP N/A 06/03/2013   Procedure: ENDOSCOPIC RETROGRADE CHOLANGIOPANCREATOGRAPHY (ERCP);  Surgeon: Ladene Artist, MD;  Location: [facility] ENDOSCOPY;  Service: Endoscopy;  Laterality: N/A;   NO PAST SURGERIES         Family History  Problem Relation Age of Onset   Diabetes Mellitus II Mother    Diabetes Mother    Heart disease Father    Stroke Maternal Aunt    CAD Neg Hx     Social History   Tobacco Use   Smoking status: Every Day    Packs/day: 1.00    Years: 41.00    Pack years: 41.00    Types: Cigarettes   Smokeless tobacco: Never   Tobacco comments:    Gets 4 puffs a day.   Vaping Use   Vaping Use: Never used  Substance Use Topics   Alcohol use:  No   Drug use: No    Home Medications Prior to Admission medications   Medication Sig Start Date End Date Taking? Authorizing Provider  albuterol (PROVENTIL) (2.5 MG/3ML) 0.083% nebulizer solution Take 3 mLs (2.5 mg total) by nebulization every 3 (three) hours as needed for wheezing or shortness of breath. 02/20/19   Aline August, MD  albuterol (VENTOLIN HFA) 108 (90 Base) MCG/ACT inhaler Inhale 2 puffs into the lungs every 4 (four) hours as needed for wheezing or shortness of breath. 02/20/19   Aline August, MD  Ascorbic Acid (VITAMIN C PO) Take 1 tablet by mouth 3 (three) times a week.      [provider]  atorvastatin (LIPITOR) 10 MG tablet Take 10 mg by mouth daily at 6 PM.  09/21/15   [provider]  benztropine (COGENTIN) 2 MG tablet Take 1 tablet (2 mg total) by mouth 2 (two) times daily as needed for tremors. 07/26/20   Arfeen, Arlyce Harman, MD  BREZTRI AEROSPHERE 160-9-4.8 MCG/ACT AERO Inhale 2 puffs into the lungs in the morning and at bedtime. 07/18/20   [provider]  carvedilol (COREG) 3.125 MG tablet Take 1 tablet (3.125 mg total) by mouth 2 (two) times daily with a meal. Patient taking differently: Take 3.125 mg by mouth every morning. 02/20/19   Aline August, MD  cetirizine (ZYRTEC) 10 MG tablet Take 10 mg by mouth daily as needed for allergies or rhinitis.    [provider]  cholecalciferol (VITAMIN D) 25 MCG (1000 UNIT) tablet Take 4,000 Units by mouth every evening.     [provider]  Cyanocobalamin (B-12 PO) Take 1 tablet by mouth every other day.    [provider]  doxycycline (VIBRAMYCIN) 100 MG capsule Take 1 capsule (100 mg total) by mouth 2 (two) times daily. One po bid x 7 days 08/04/20   Barb Merino, MD  enalapril (VASOTEC) 10 MG tablet Take 10 mg by mouth daily. 06/04/16   [provider]  famotidine (PEPCID) 40 MG tablet Take 40 mg by mouth 2 (two) times daily. 12/28/18   [provider]  Fluticasone-Salmeterol (ADVAIR DISKUS) 250-50 MCG/DOSE AEPB Inhale 1 puff into the lungs 2 (two) times daily. 06/23/19 09/21/19  Harvie Heck, MD  guaiFENesin-dextromethorphan (ROBITUSSIN DM) 100-10 MG/5ML syrup Take 10 mLs by mouth every 4 (four) hours as needed for cough. 02/20/19   Aline August, MD  mirtazapine (REMERON) 30 MG tablet Take 1 tablet (30 mg total) by mouth at bedtime. 07/25/20 07/25/21  Arfeen, Arlyce Harman, MD  montelukast (SINGULAIR) 10 MG tablet Take 1 tablet (10 mg total) by mouth daily. 06/23/19 09/21/19  Harvie Heck, MD  multivitamin (ONE-A-DAY MEN'S) TABS tablet Take 1 tablet by mouth daily.     [provider]  OXYGEN Inhale 2 L/min into the lungs continuous.     [provider]  polyethylene glycol (MIRALAX / GLYCOLAX) 17 g packet Take 17 g by mouth daily as needed for mild constipation.    [provider]  predniSONE (DELTASONE) 10 MG tablet Take 10 mg by mouth 2 (two) times daily. 06/01/20   [provider]  risperiDONE (RISPERDAL) 3 MG tablet Take 1 tablet (3 mg total) by mouth 2 (two) times daily. 07/25/20   Arfeen, Arlyce Harman, MD  sodium chloride (OCEAN) 0.65 % SOLN nasal spray Place 1 spray into both nostrils as needed for congestion.    [provider]  tamsulosin (FLOMAX) 0.4 MG CAPS capsule Take 0.4 mg by mouth  at bedtime. 07/23/20   [provider]    Allergies    Patient has no known allergies.  Review of Systems   Review of Systems  Respiratory:  Positive for shortness of breath and wheezing.   All other systems reviewed and are negative.  Physical Exam Updated Vital Signs BP (!) 156/92 (BP Location: Right Arm)   Pulse 93   Temp (!) 97.3 F (36.3 C) (Oral)   Resp 19   SpO2 100%   Physical Exam Vitals and nursing note reviewed.  Constitutional:      General: He is not in acute distress.    Appearance: He is not ill-appearing.  HENT:     Head: Normocephalic and atraumatic.     Right Ear: Tympanic membrane and external ear normal.     Left Ear: Tympanic membrane and external ear normal.     Nose: Nose normal.     Mouth/Throat:     Mouth: Mucous membranes are moist.     Pharynx: Oropharynx is clear.  Eyes:     General: No scleral icterus.       Right eye: No discharge.        Left eye: No discharge.     Extraocular Movements: Extraocular movements intact.     Conjunctiva/sclera: Conjunctivae normal.     Pupils: Pupils are equal, round, and reactive to light.  Neck:     Vascular: No JVD.  Cardiovascular:     Rate and Rhythm: Normal rate and regular rhythm.     Pulses: Normal pulses.          Radial  pulses are 2+ on the right side and 2+ on the left side.     Heart sounds: Normal heart sounds.  Pulmonary:     Comments: Expiratory wheezing heard in all lung fields.  Lung sounds slightly decreased on the right compared to the left.  Symmetric chest rise.  With accessory muscle use.  No nasal flaring.  Currently on nonrebreather with oxygen saturations of 100%.  Patient talking in short sentences. Abdominal:     Comments: Abdomen is soft, non-distended, and non-tender in all quadrants. No rigidity, no guarding. No peritoneal signs.  Musculoskeletal:        General: Normal range of motion.     Cervical back: Normal range of motion.     Right lower leg: No edema.     Left lower leg: No edema.  Skin:    General: Skin is warm and dry.     Capillary Refill: Capillary refill takes less than 2 seconds.  Neurological:     Mental Status: He is oriented to person, place, and time.     GCS: GCS eye subscore is 4. GCS verbal subscore is 5. GCS motor subscore is 6.     Comments: Fluent speech, no facial droop.  Psychiatric:        Behavior: Behavior normal.    ED Results / Procedures / Treatments   Labs (all labs ordered are listed, but only abnormal results are displayed) Labs Reviewed  CBC WITH DIFFERENTIAL/PLATELET - Abnormal; Notable for the following components:      Result Value   Eosinophils Absolute 0.6 (*)    All other components within normal limits  BASIC METABOLIC PANEL - Abnormal; Notable for the following components:   Sodium 132 (*)    Glucose, Bld 103 (*)    BUN 6 (*)    Calcium 8.8 (*)    All other components within normal  limits  RESP PANEL BY RT-PCR (FLU A&B, COVID) [id number]    EKG EKG Interpretation  Date/Time:  Sunday September 02 2020 13:37:21 EDT Ventricular Rate:  96 PR Interval:  146 QRS Duration: 83 QT Interval:  331 QTC Calculation: 421 R Axis:   95 Text Interpretation: Sinus rhythm Multiple ventricular premature complexes Aberrant conduction of SV  complex(es) Right axis deviation Probable anteroseptal infarct, old Nonspecific T abnormalities, lateral leads Confirmed by Noemi Chapel [phone]) on 09/02/2020 1:53:34 PM  Radiology DG Chest Portable 1 View  Result Date: 09/02/2020 CLINICAL DATA:  Hypoxia. EXAM: PORTABLE CHEST 1 VIEW COMPARISON:  Aug 02, 2020 FINDINGS: Cardiomediastinal silhouette is normal. Mediastinal contours appear intact. There is no evidence of focal airspace consolidation, pleural effusion or pneumothorax. Severe emphysematous changes with large upper lobe predominant bullous formation. Osseous structures are without acute abnormality. Soft tissues are grossly normal. IMPRESSION: Severe emphysematous changes with large upper lobe predominant bullous formation. No evidence of focal consolidation. Electronically Signed   By: Fidela Salisbury M.D.   On: 09/02/2020 14:21    Procedures .Critical Care  Date/Time: 09/02/2020 10:19 PM Performed by: Barrie Folk, PA-C Authorized by: Barrie Folk, PA-C   Critical care provider statement:    Critical care time (minutes):  30   Critical care was necessary to treat or prevent imminent or life-threatening deterioration of the following conditions:  Respiratory failure   Critical care was time spent personally by me on the following activities:  Evaluation of patient's response to treatment, examination of patient, ordering and performing treatments and interventions, ordering and review of laboratory studies, ordering and review of radiographic studies, pulse oximetry, re-evaluation of patient's condition, obtaining history from patient or surrogate and review of old charts   Medications Ordered in ED Medications  albuterol (PROVENTIL,VENTOLIN) solution continuous neb (10 mg/hr Nebulization Given 09/02/20 1452)    ED Course  I have reviewed the triage vital signs and the nursing notes.  Pertinent labs & imaging results that were available during my care of the  patient were reviewed by me and considered in my medical decision making (see chart for details).    MDM Rules/Calculators/A&P                          History provided by patient with additional history obtained from chart review.    Patient presenting with shortness of breath and wheezing.  He is afebrile here.  He arrives to ED on nonrebreather with oxygen saturation of 100%.  He has wheezing on exam with decreased sounds on the right.  Presentation is suggestive of COPD exacerbation in the setting of continued tobacco abuse. Lower suspicion for PE as cause of hypoxia given he has known COPD and was wheezing today.  EKG without ischemic changes. Chest x-ray without focal consolidation, no findings to suggest pneumonia.  Radiologist does comment on severe emphysematous changes with large upper lobe predominant bullous formation.  I viewed image and agree with radiologist impression. Basic labs collected in case admission is needed.  CBC and BMP are overall unremarkable.  COVID test is in process.  Patient put on 2 L nasal cannula, his home oxygen requirement.  He continues to have significantly increased work of breathing and wheezing.  He received multiple medications prehospital including mag and Solu-Medrol as well as 5 albuterol treatments.  Patient given continuous neb. Shared ED visit with ED attending Dr. Sabra Heck.  Patient care transferred to K. Ford PA-C at the  end of my shift pending reassessment. Patient presentation, ED course, and plan of care discussed with review of all pertinent labs and imaging. Please see her note for further details regarding further ED course and disposition.   Portions of this note were generated with [employer]. Dictation errors may occur despite best attempts at proofreading.   Final Clinical Impression(s) / ED Diagnoses Final diagnoses:  Acute respiratory failure with hypoxia [facility])    Rx / DC Orders ED Discharge Orders     None         Lewanda Rife 09/02/20 2220    Noemi Chapel, MD 09/04/20 [phone]

## 2020-09-02 NOTE — ED Triage Notes (Signed)
Pt BIB [facility] from home for RD, 86% RA, wheezing in all fields, diminished in bilateral lower lungs, received Albuterol neb treatments from spouse that did not work, FD gave another treatment with no relief. Pt tripoding after getting from chair to bed with EMS  [facility] gave duo neb, 2g Mag, 125 mg Solumedrol IVP. Breathing and RR improved.   20G LH  BP 160/110, 138/94 86% RA, 99% 6L  RR 26

## 2020-09-02 NOTE — ED Notes (Signed)
Family updated as to patient's status.

## 2020-09-03 DIAGNOSIS — Z9981 Dependence on supplemental oxygen: Secondary | ICD-10-CM | POA: Diagnosis not present

## 2020-09-03 DIAGNOSIS — Z66 Do not resuscitate: Secondary | ICD-10-CM | POA: Diagnosis present

## 2020-09-03 DIAGNOSIS — Z79899 Other long term (current) drug therapy: Secondary | ICD-10-CM | POA: Diagnosis not present

## 2020-09-03 DIAGNOSIS — Z20822 Contact with and (suspected) exposure to covid-19: Secondary | ICD-10-CM | POA: Diagnosis present

## 2020-09-03 DIAGNOSIS — Z823 Family history of stroke: Secondary | ICD-10-CM | POA: Diagnosis not present

## 2020-09-03 DIAGNOSIS — Z833 Family history of diabetes mellitus: Secondary | ICD-10-CM | POA: Diagnosis not present

## 2020-09-03 DIAGNOSIS — J9621 Acute and chronic respiratory failure with hypoxia: Secondary | ICD-10-CM | POA: Diagnosis present

## 2020-09-03 DIAGNOSIS — Z87891 Personal history of nicotine dependence: Secondary | ICD-10-CM | POA: Diagnosis not present

## 2020-09-03 DIAGNOSIS — I10 Essential (primary) hypertension: Secondary | ICD-10-CM | POA: Diagnosis present

## 2020-09-03 DIAGNOSIS — J441 Chronic obstructive pulmonary disease with (acute) exacerbation: Secondary | ICD-10-CM | POA: Diagnosis not present

## 2020-09-03 DIAGNOSIS — N400 Enlarged prostate without lower urinary tract symptoms: Secondary | ICD-10-CM | POA: Diagnosis present

## 2020-09-03 DIAGNOSIS — E785 Hyperlipidemia, unspecified: Secondary | ICD-10-CM | POA: Diagnosis present

## 2020-09-03 DIAGNOSIS — Z8249 Family history of ischemic heart disease and other diseases of the circulatory system: Secondary | ICD-10-CM | POA: Diagnosis not present

## 2020-09-03 DIAGNOSIS — Z7952 Long term (current) use of systemic steroids: Secondary | ICD-10-CM | POA: Diagnosis not present

## 2020-09-03 DIAGNOSIS — F209 Schizophrenia, unspecified: Secondary | ICD-10-CM | POA: Diagnosis present

## 2020-09-03 DIAGNOSIS — J309 Allergic rhinitis, unspecified: Secondary | ICD-10-CM | POA: Diagnosis present

## 2020-09-03 LAB — CBC
HCT: 40.5 % (ref 39.0–52.0)
Hemoglobin: 13.7 g/dL (ref 13.0–17.0)
MCH: 29.9 pg (ref 26.0–34.0)
MCHC: 33.8 g/dL (ref 30.0–36.0)
MCV: 88.4 fL (ref 80.0–100.0)
Platelets: 230 10*3/uL (ref 150–400)
RBC: 4.58 MIL/uL (ref 4.22–5.81)
RDW: 12.7 % (ref 11.5–15.5)
WBC: 10 10*3/uL (ref 4.0–10.5)
nRBC: 0 % (ref 0.0–0.2)

## 2020-09-03 MED ORDER — IPRATROPIUM-ALBUTEROL 0.5-2.5 (3) MG/3ML IN SOLN
3.0000 mL | RESPIRATORY_TRACT | Status: DC
  Administered 2020-09-03 – 2020-09-05 (×11): 3 mL via RESPIRATORY_TRACT
  Filled 2020-09-03 (×9): qty 3

## 2020-09-03 MED ORDER — BENZONATATE 100 MG PO CAPS
200.0000 mg | ORAL_CAPSULE | Freq: Two times a day (BID) | ORAL | Status: DC | PRN
  Administered 2020-09-03 (×2): 200 mg via ORAL
  Filled 2020-09-03 (×2): qty 2

## 2020-09-03 MED ORDER — METHYLPREDNISOLONE SODIUM SUCC 125 MG IJ SOLR
60.0000 mg | Freq: Three times a day (TID) | INTRAMUSCULAR | Status: DC
  Administered 2020-09-03 – 2020-09-04 (×3): 60 mg via INTRAVENOUS
  Filled 2020-09-03 (×3): qty 2

## 2020-09-03 MED ORDER — PREDNISONE 20 MG PO TABS: 40.0000 mg | ORAL_TABLET | Freq: Every day | ORAL | Status: DC

## 2020-09-03 NOTE — Hospital Course (Signed)
65 y.o. male with medical history significant of severe COPD with recurrent admissions for exacerbations, chronic respiratory failure with hypoxia on 2 L/min oxygen, schizophrenia, hyperlipidemia, hypertension presented to the ED 09/02/20 with severe progressive shortness of breath increased wheezing, severe dyspnea with any exertion.  Admitted for management of acute exacerbation of COPD.

## 2020-09-03 NOTE — Progress Notes (Addendum)
Pt refused ambulating and refused heart monitor

## 2020-09-03 NOTE — Progress Notes (Addendum)
PROGRESS NOTE    Blake Mcknight   YPP:[id number]  DOB: 15-Nov-1955  PCP: [name], MD    DOA: 09/02/2020 LOS: 0   Assessment & Plan   Active Problems:   COPD with acute exacerbation ([place])   Severe COPD with acute exacerbation -with admission for same a month ago.  Despite EMS treated with high-dose IV steroid, IV magnesium neb treatments in addition to 1 hour continuous albuterol neb treatment in the ED, patient continues to have diffuse expiratory wheezing, very diminished aeration and severe dyspnea with even minimal exertion.  Did not require BiPAP. --6/20: continues with severe DOE, not tolerating mobility --Continue IV steroids another day  --Transition to prednisone to complete 5-day course --Dulera, scheduled DuoNebs, as needed albuterol nebs --Azithromycin  --Mucinex   Acute on chronic respiratory failure with hypoxia -initially required 6 to 10 L/min but was able to be weaned to his baseline after 1 hour neb treatment.  This was associated with significant respiratory distress in the setting of above. --Supplemental oxygen to maintain sats 88 to 93%   Hypertension -continue home enalapril and Coreg pending med history   Hyperlipidemia -continue statin  Agitation - reported by RN 6/21, pt pulled IV, refuses new one. --Added PRN Seroquel BID.   --PRN IM Haldol also ordered. --Consider psych consult if persistent   History of schizophrenia -continue home Risperdal, Remeron, Cogentin.   Allergic rhinitis -continue Singulair and loratadine (for Claritin)   BPH -continue Flomax    Patient BMI: Body mass index is 25.18 kg/m.   DVT prophylaxis: enoxaparin (LOVENOX) injection 40 mg Start: 09/02/20 1700   Diet:  Diet Orders (From admission, onward)     Start     Ordered   09/02/20 1641  Diet regular Room service appropriate? Yes; Fluid consistency: Thin  Diet effective now       Question Answer Comment  Room service appropriate? Yes   Fluid  consistency: Thin      09/02/20 1642              Code Status: DNR   Brief Narrative / Hospital Course to Date:   65 y.o. male with medical history significant of severe COPD with recurrent admissions for exacerbations, chronic respiratory failure with hypoxia on 2 L/min oxygen, schizophrenia, hyperlipidemia, hypertension presented to the ED 09/02/20 with severe progressive shortness of breath increased wheezing, severe dyspnea with any exertion.  Admitted for management of acute exacerbation of COPD.      Subjective 09/03/20    Pt declined to ambulate with RN this AM due to fatigue.  Encouraged he do so to assess his breathing, he agreed.  He says he still feels short of breath today.  No fevers or chills.   Disposition Plan & Communication   Status is: Inpatient  Remains inpatient appropriate because:IV treatments appropriate due to intensity of illness or inability to take PO, and continues to have significant dyspnea with minimal exertion.  Dispo: The patient is from: Home              Anticipated d/c is to: Home              Patient currently is not medically stable to d/c.   Difficult to place patient No     Consults, Procedures, Significant Events   Consultants:  None  Procedures:  None  Antimicrobials:  Anti-infectives (From admission, onward)    Start     Dose/Rate Route Frequency Ordered Stop   09/02/20 1800  azithromycin (ZITHROMAX) tablet 500 mg        500 mg Oral Daily 09/02/20 1751           Micro    Objective   Vitals:   09/03/20 0425 09/03/20 0605 09/03/20 0723 09/03/20 1321  BP:  (!) 153/88    Pulse: 98 78    Resp: (!) 22     Temp:  98 F (36.7 C)    TempSrc:  Oral    SpO2: 98% 99% 98% 98%  Weight:      Height:        Intake/Output Summary (Last 24 hours) at 09/03/2020 1328 Last data filed at 09/03/2020 1238 Gross per 24 hour  Intake 360 ml  Output 1825 ml  Net -1465 ml   Filed Weights   09/02/20 1347  Weight: 70.8 kg     Physical Exam:  General exam: awake, alert, no acute distress Respiratory system: improved aeration, no wheezes during time of exam, normal respiratory effort, some dyspnea noted when sitting up in bed for exam. Cardiovascular system: normal S1/S2, RRRno pedal edema.   Central nervous system: no gross focal neurologic deficits, normal speech Extremities: moves all, no edema, normal tone Skin: dry, intact, normal temperature Psychiatry: normal mood, flat affect  Labs   Data Reviewed: I have personally reviewed following labs and imaging studies  CBC: Recent Labs  Lab 09/02/20 1344 09/03/20 0038  WBC 8.8 10.0  NEUTROABS 4.4  --   HGB 14.7 13.7  HCT 44.0 40.5  MCV 90.5 88.4  PLT 238 161   Basic Metabolic Panel: Recent Labs  Lab 09/02/20 1344  NA 132*  K 4.2  CL 99  CO2 26  GLUCOSE 103*  BUN 6*  CREATININE 0.78  CALCIUM 8.8*   GFR: Estimated Creatinine Clearance: 86.4 mL/min (by C-G formula based on SCr of 0.78 mg/dL). Liver Function Tests: No results for input(s): AST, ALT, ALKPHOS, BILITOT, PROT, ALBUMIN in the last 168 hours. No results for input(s): LIPASE, AMYLASE in the last 168 hours. No results for input(s): AMMONIA in the last 168 hours. Coagulation Profile: No results for input(s): INR, PROTIME in the last 168 hours. Cardiac Enzymes: No results for input(s): CKTOTAL, CKMB, CKMBINDEX, TROPONINI in the last 168 hours. BNP (last 3 results) No results for input(s): PROBNP in the last 8760 hours. HbA1C: No results for input(s): HGBA1C in the last 72 hours. CBG: No results for input(s): GLUCAP in the last 168 hours. Lipid Profile: No results for input(s): CHOL, HDL, LDLCALC, TRIG, CHOLHDL, LDLDIRECT in the last 72 hours. Thyroid Function Tests: No results for input(s): TSH, T4TOTAL, FREET4, T3FREE, THYROIDAB in the last 72 hours. Anemia Panel: No results for input(s): VITAMINB12, FOLATE, FERRITIN, TIBC, IRON, RETICCTPCT in the last 72 hours. Sepsis  Labs: No results for input(s): PROCALCITON, LATICACIDVEN in the last 168 hours.  Recent Results (from the past 240 hour(s))  Resp Panel by RT-PCR (Flu A&B, Covid) Nasopharyngeal Swab     Status: None   Collection Time: 09/02/20  2:00 PM   Specimen: Nasopharyngeal Swab; Nasopharyngeal(NP) swabs in vial transport medium  Result Value Ref Range Status   SARS Coronavirus 2 by RT PCR NEGATIVE NEGATIVE Final    Comment: (NOTE) SARS-CoV-2 target nucleic acids are NOT DETECTED.  The SARS-CoV-2 RNA is generally detectable in upper respiratory specimens during the acute phase of infection. The lowest concentration of SARS-CoV-2 viral copies this assay can detect is 138 copies/mL. A negative result does not preclude SARS-Cov-2 infection and  should not be used as the sole basis for treatment or other patient management decisions. A negative result may occur with  improper specimen collection/handling, submission of specimen other than nasopharyngeal swab, presence of viral mutation(s) within the areas targeted by this assay, and inadequate number of viral copies(<138 copies/mL). A negative result must be combined with clinical observations, patient history, and epidemiological information. The expected result is Negative.  Fact Sheet for Patients:  EntrepreneurPulse.com.au  Fact Sheet for Healthcare Providers:  IncredibleEmployment.be  This test is no t yet approved or cleared by the [place] FDA and  has been authorized for detection and/or diagnosis of SARS-CoV-2 by FDA under an Emergency Use Authorization (EUA). This EUA will remain  in effect (meaning this test can be used) for the duration of the COVID-19 declaration under Section 564(b)(1) of the Act, 21 U.S.C.section 360bbb-3(b)(1), unless the authorization is terminated  or revoked sooner.       Influenza A by PCR NEGATIVE NEGATIVE Final   Influenza B by PCR NEGATIVE NEGATIVE Final     Comment: (NOTE) The Xpert Xpress SARS-CoV-2/FLU/RSV plus assay is intended as an aid in the diagnosis of influenza from Nasopharyngeal swab specimens and should not be used as a sole basis for treatment. Nasal washings and aspirates are unacceptable for Xpert Xpress SARS-CoV-2/FLU/RSV testing.  Fact Sheet for Patients: EntrepreneurPulse.com.au  Fact Sheet for Healthcare Providers: IncredibleEmployment.be  This test is not yet approved or cleared by the [place] FDA and has been authorized for detection and/or diagnosis of SARS-CoV-2 by FDA under an Emergency Use Authorization (EUA). This EUA will remain in effect (meaning this test can be used) for the duration of the COVID-19 declaration under Section 564(b)(1) of the Act, 21 U.S.C. section 360bbb-3(b)(1), unless the authorization is terminated or revoked.  Performed at [facility], [place] [address], [place],  [address]       Imaging Studies   DG Chest Portable 1 View  Result Date: 09/02/2020 CLINICAL DATA:  Hypoxia. EXAM: PORTABLE CHEST 1 VIEW COMPARISON:  Aug 02, 2020 FINDINGS: Cardiomediastinal silhouette is normal. Mediastinal contours appear intact. There is no evidence of focal airspace consolidation, pleural effusion or pneumothorax. Severe emphysematous changes with large upper lobe predominant bullous formation. Osseous structures are without acute abnormality. Soft tissues are grossly normal. IMPRESSION: Severe emphysematous changes with large upper lobe predominant bullous formation. No evidence of focal consolidation. Electronically Signed   By: Fidela Salisbury M.D.   On: 09/02/2020 14:21     Medications   Scheduled Meds:  atorvastatin  10 mg Oral q1800   azithromycin  500 mg Oral Daily   carvedilol  3.125 mg Oral BID WC   dextromethorphan-guaiFENesin  1 tablet Oral BID   enalapril  10 mg Oral Daily   enoxaparin (LOVENOX) injection  40 mg Subcutaneous Q24H    famotidine  40 mg Oral BID   ipratropium-albuterol  3 mL Nebulization Q6H   loratadine  10 mg Oral Daily   mirtazapine  30 mg Oral QHS   mometasone-formoterol  2 puff Inhalation BID   predniSONE  40 mg Oral Q breakfast   risperiDONE  3 mg Oral BID   tamsulosin  0.4 mg Oral QHS   Continuous Infusions:     LOS: 0 days    Time spent: 30 minutes    Ezekiel Slocumb, DO [facility]  09/03/2020, 1:28 PM      If 7PM-7AM, please contact night-coverage. How to contact the [facility] Attending or Consulting  provider [place] or covering provider during after hours [place], for this patient?    Check the care team in [facility] and look for a) attending/consulting [facility] provider listed and b) the [facility] team listed Log into [email or web address] and use [place]'s universal password to access. If you do not have the password, please contact the hospital operator. Locate the [facility] provider you are looking for under [facility] and page to a number that you can be directly reached. If you still have difficulty reaching the provider, please page the [facility] (Director on Call) for the Hospitalists listed on amion for assistance.

## 2020-09-03 NOTE — Plan of Care (Signed)

## 2020-09-04 MED ORDER — HALOPERIDOL 5 MG PO TABS
5.0000 mg | ORAL_TABLET | Freq: Three times a day (TID) | ORAL | Status: DC | PRN
  Filled 2020-09-04: qty 1

## 2020-09-04 MED ORDER — QUETIAPINE FUMARATE 50 MG PO TABS
25.0000 mg | ORAL_TABLET | Freq: Two times a day (BID) | ORAL | Status: DC | PRN
  Administered 2020-09-04: 25 mg via ORAL
  Filled 2020-09-04: qty 1

## 2020-09-04 MED ORDER — BENZTROPINE MESYLATE 2 MG PO TABS
2.0000 mg | ORAL_TABLET | Freq: Two times a day (BID) | ORAL | Status: DC
  Administered 2020-09-04 – 2020-09-05 (×3): 2 mg via ORAL
  Filled 2020-09-04 (×4): qty 1

## 2020-09-04 MED ORDER — METHYLPREDNISOLONE SODIUM SUCC 125 MG IJ SOLR
60.0000 mg | Freq: Two times a day (BID) | INTRAMUSCULAR | Status: AC
  Administered 2020-09-04 – 2020-09-05 (×2): 60 mg via INTRAVENOUS
  Filled 2020-09-04 (×2): qty 2

## 2020-09-04 MED ORDER — HALOPERIDOL LACTATE 5 MG/ML IJ SOLN
5.0000 mg | Freq: Three times a day (TID) | INTRAMUSCULAR | Status: DC | PRN
  Administered 2020-09-04: 5 mg via INTRAMUSCULAR
  Filled 2020-09-04: qty 1

## 2020-09-04 MED ORDER — PREDNISONE 20 MG PO TABS
40.0000 mg | ORAL_TABLET | Freq: Every day | ORAL | Status: DC
  Filled 2020-09-04: qty 2

## 2020-09-04 NOTE — Evaluation (Signed)
Physical Therapy Evaluation Patient Details Name: Blake Mcknight MRN: [id number] DOB: [birth year] Today's Date: 09/04/2020   History of Present Illness  Pt is a 65 y/o male admitted 6/19 secondary to worsening SOB and respiratory failure. Thought to be secondary to COPD exacerbation. PMH includes HTN, COPD on 2L, and schizophrenia.  Clinical Impression  Pt admitted secondary to problem above with deficits below. Pt overall at a supervision to min guard A for mobility tasks this session. Gait limited secondary to increased SOB and fatigue. Oxygen sats at 97-99% on 2L throughout gait. Educated about using rollator at home and pt reports he has one, but unsure of accuracy. Reports his sister is available to assist as needed at d/c. Will continue to follow acutely.     Follow Up Recommendations No PT follow up;Supervision for mobility/OOB    Equipment Recommendations  Other (comment) (rollator if does not have one)    Recommendations for Other Services       Precautions / Restrictions Precautions Precautions: Fall Restrictions Weight Bearing Restrictions: No      Mobility  Bed Mobility Overal bed mobility: Modified Independent                  Transfers Overall transfer level: Needs assistance Equipment used: None Transfers: Sit to/from Stand Sit to Stand: Supervision         General transfer comment: supervision for safety to stand.  Ambulation/Gait Ambulation/Gait assistance: Min guard;Supervision Gait Distance (Feet): 50 Feet Assistive device: None Gait Pattern/deviations: Step-through pattern;Decreased stride length Gait velocity: Decreased   General Gait Details: Mild unsteadiness, but no overt LOB noted. Requiring min guard to supervision for safety. Distance limited secondary to fatigue. Educated about using rollator for longer distances; pt reports he has one at home.  Stairs            Wheelchair Mobility    Modified Rankin (Stroke Patients  Only)       Balance Overall balance assessment: Mild deficits observed, not formally tested                                           Pertinent Vitals/Pain Pain Assessment: No/denies pain    Home Living Family/patient expects to be discharged to:: Private residence Living Arrangements: Other relatives (sister and friend) Available Help at Discharge: Family;Available 24 hours/day Type of Home: House Home Access: Stairs to enter Entrance Stairs-Rails: None Entrance Stairs-Number of Steps: 3 Home Layout: One level Home Equipment: Clinical cytogeneticist - 4 wheels      Prior Function Level of Independence: Needs assistance   Gait / Transfers Assistance Needed: Reports sister helps with stair management. Sister helps with ambulation as needed as well.  ADL's / Homemaking Assistance Needed: Sister assists with bathing and dressing. She also helps with shower transfers.        Hand Dominance        Extremity/Trunk Assessment   Upper Extremity Assessment Upper Extremity Assessment: Overall WFL for tasks assessed    Lower Extremity Assessment Lower Extremity Assessment: Generalized weakness    Cervical / Trunk Assessment Cervical / Trunk Assessment: Normal  Communication   Communication: No difficulties  Cognition Arousal/Alertness: Awake/alert Behavior During Therapy: WFL for tasks assessed/performed Overall Cognitive Status: No family/caregiver present to determine baseline cognitive functioning  General Comments: Reports conflicting information at about home information. Pt with hx of schizophrenia      General Comments General comments (skin integrity, edema, etc.): Oxygen sats at 97-99% on 2L.    Exercises     Assessment/Plan    PT Assessment Patient needs continued PT services  PT Problem List Decreased balance;Decreased mobility;Decreased activity tolerance;Decreased safety awareness;Decreased  cognition;Decreased knowledge of use of DME;Cardiopulmonary status limiting activity       PT Treatment Interventions Gait training;DME instruction;Stair training;Functional mobility training;Therapeutic exercise;Therapeutic activities;Balance training;Patient/family education    PT Goals (Current goals can be found in the Care Plan section)  Acute Rehab PT Goals Patient Stated Goal: to go home PT Goal Formulation: With patient Time For Goal Achievement: 09/18/20 Potential to Achieve Goals: Good    Frequency Min 3X/week   Barriers to discharge        Co-evaluation               AM-PAC PT "6 Clicks" Mobility  Outcome Measure Help needed turning from your back to your side while in a flat bed without using bedrails?: None Help needed moving from lying on your back to sitting on the side of a flat bed without using bedrails?: None Help needed moving to and from a bed to a chair (including a wheelchair)?: A Little Help needed standing up from a chair using your arms (e.g., wheelchair or bedside chair)?: A Little Help needed to walk in hospital room?: A Little Help needed climbing 3-5 steps with a railing? : A Little 6 Click Score: 20    End of Session Equipment Utilized During Treatment: Gait belt Activity Tolerance: Patient limited by fatigue Patient left: in bed;with call bell/phone within reach;with bed alarm set Nurse Communication: Mobility status;Other (comment) (pt requesting nebulizer) PT Visit Diagnosis: Difficulty in walking, not elsewhere classified (R26.2)    Time: 1610-9604 PT Time Calculation (min) (ACUTE ONLY): 18 min   Charges:   PT Evaluation $PT Eval Low Complexity: 1 Low          Lou Miner, DPT  Acute Rehabilitation Services  Pager: [phone] Office: [phone]   Rudean Hitt 09/04/2020, 11:47 AM

## 2020-09-04 NOTE — Progress Notes (Signed)
PROGRESS NOTE    Blake Mcknight   ELF:[id number]  DOB: 11/22/1955  PCP: [name], MD    DOA: 09/02/2020 LOS: 1   Assessment & Plan   Active Problems:   COPD with acute exacerbation ([place])   Severe COPD with acute exacerbation -with admission for same a month ago.  Despite EMS treated with high-dose IV steroid, IV magnesium neb treatments in addition to 1 hour continuous albuterol neb treatment in the ED, patient continues to have diffuse expiratory wheezing, very diminished aeration and severe dyspnea with even minimal exertion.  Did not require BiPAP. 6/20: continues with severe DOE, not tolerating mobility 6/21: remains diminished with significant wheezes --Will extend IV steroids for another day  --Transition to prednisone and would do longer taper --Dulera, scheduled DuoNebs, as needed albuterol nebs --Azithromycin  --Mucinex   Acute on chronic respiratory failure with hypoxia -initially required 6 to 10 L/min but was able to be weaned to his baseline after 1 hour neb treatment.  This was associated with significant respiratory distress in the setting of above. --Supplemental oxygen to maintain sats 88 to 93%   Hypertension -continue home enalapril and Coreg pending med history   Hyperlipidemia -continue statin  Agitation - reported by RN 6/21, pt pulled IV, refuses new one. --Added PRN Seroquel BID.   --PRN IM Haldol also ordered. --Consider psych consult if persistent   History of schizophrenia -continue home Risperdal, Remeron, Cogentin.   Allergic rhinitis -continue Singulair and loratadine (for Claritin)   BPH -continue Flomax    Patient BMI: Body mass index is 25.18 kg/m.   DVT prophylaxis: enoxaparin (LOVENOX) injection 40 mg Start: 09/02/20 1700   Diet:  Diet Orders (From admission, onward)     Start     Ordered   09/02/20 1641  Diet regular Room service appropriate? Yes; Fluid consistency: Thin  Diet effective now       Question Answer  Comment  Room service appropriate? Yes   Fluid consistency: Thin      09/02/20 1642              Code Status: DNR   Brief Narrative / Hospital Course to Date:   65 y.o. male with medical history significant of severe COPD with recurrent admissions for exacerbations, chronic respiratory failure with hypoxia on 2 L/min oxygen, schizophrenia, hyperlipidemia, hypertension presented to the ED 09/02/20 with severe progressive shortness of breath increased wheezing, severe dyspnea with any exertion.  Admitted for management of acute exacerbation of COPD.      Subjective 09/04/20    Pt awake laying in bed when seen.  Reports still feeling short of breath.  When he sat up during exam he had increased dyspnea and was visibly short of breath.  He denies fever/chills or any complaints besides his breathing.   Disposition Plan & Communication   Status is: Inpatient  Remains inpatient appropriate because:IV treatments appropriate due to intensity of illness or inability to take PO, and continues to have significant dyspnea with minimal exertion. Continuing on IV steroids.   Dispo: The patient is from: Home              Anticipated d/c is to: Home              Patient currently is not medically stable to d/c.   Difficult to place patient No     Consults, Procedures, Significant Events   Consultants:  None  Procedures:  None  Antimicrobials:  Anti-infectives (From admission, onward)  Start     Dose/Rate Route Frequency Ordered Stop   09/02/20 1800  azithromycin (ZITHROMAX) tablet 500 mg        500 mg Oral Daily 09/02/20 1751           Micro    Objective   Vitals:   09/04/20 0548 09/04/20 0726 09/04/20 1136 09/04/20 1301  BP: 129/87   (!) 154/96  Pulse: 97  (!) 114 81  Resp: 19  (!) 24 18  Temp: 98.6 F (37 C)   97.9 F (36.6 C)  TempSrc: Oral   Oral  SpO2: 98% 98% 98% 100%  Weight:      Height:        Intake/Output Summary (Last 24 hours) at 09/04/2020  1324 Last data filed at 09/04/2020 4098 Gross per 24 hour  Intake 240 ml  Output 350 ml  Net -110 ml   Filed Weights   09/02/20 1347  Weight: 70.8 kg    Physical Exam:  General exam: awake, alert, no acute distress Respiratory system: diminished aeration, expiratory wheezing, increased respiratory effort with DOE and accessory muscle use after sitting up in bed Cardiovascular system: normal S1/S2, RRR, no pedal edema.   Central nervous system: no gross focal neurologic deficits, normal speech Extremities: moves all, no edema, normal tone Psychiatry: normal mood, flat affect  Labs   Data Reviewed: I have personally reviewed following labs and imaging studies  CBC: Recent Labs  Lab 09/02/20 1344 09/03/20 0038  WBC 8.8 10.0  NEUTROABS 4.4  --   HGB 14.7 13.7  HCT 44.0 40.5  MCV 90.5 88.4  PLT 238 119   Basic Metabolic Panel: Recent Labs  Lab 09/02/20 1344  NA 132*  K 4.2  CL 99  CO2 26  GLUCOSE 103*  BUN 6*  CREATININE 0.78  CALCIUM 8.8*   GFR: Estimated Creatinine Clearance: 86.4 mL/min (by C-G formula based on SCr of 0.78 mg/dL). Liver Function Tests: No results for input(s): AST, ALT, ALKPHOS, BILITOT, PROT, ALBUMIN in the last 168 hours. No results for input(s): LIPASE, AMYLASE in the last 168 hours. No results for input(s): AMMONIA in the last 168 hours. Coagulation Profile: No results for input(s): INR, PROTIME in the last 168 hours. Cardiac Enzymes: No results for input(s): CKTOTAL, CKMB, CKMBINDEX, TROPONINI in the last 168 hours. BNP (last 3 results) No results for input(s): PROBNP in the last 8760 hours. HbA1C: No results for input(s): HGBA1C in the last 72 hours. CBG: No results for input(s): GLUCAP in the last 168 hours. Lipid Profile: No results for input(s): CHOL, HDL, LDLCALC, TRIG, CHOLHDL, LDLDIRECT in the last 72 hours. Thyroid Function Tests: No results for input(s): TSH, T4TOTAL, FREET4, T3FREE, THYROIDAB in the last 72  hours. Anemia Panel: No results for input(s): VITAMINB12, FOLATE, FERRITIN, TIBC, IRON, RETICCTPCT in the last 72 hours. Sepsis Labs: No results for input(s): PROCALCITON, LATICACIDVEN in the last 168 hours.  Recent Results (from the past 240 hour(s))  Resp Panel by RT-PCR (Flu A&B, Covid) Nasopharyngeal Swab     Status: None   Collection Time: 09/02/20  2:00 PM   Specimen: Nasopharyngeal Swab; Nasopharyngeal(NP) swabs in vial transport medium  Result Value Ref Range Status   SARS Coronavirus 2 by RT PCR NEGATIVE NEGATIVE Final    Comment: (NOTE) SARS-CoV-2 target nucleic acids are NOT DETECTED.  The SARS-CoV-2 RNA is generally detectable in upper respiratory specimens during the acute phase of infection. The lowest concentration of SARS-CoV-2 viral copies this assay can detect  is 138 copies/mL. A negative result does not preclude SARS-Cov-2 infection and should not be used as the sole basis for treatment or other patient management decisions. A negative result may occur with  improper specimen collection/handling, submission of specimen other than nasopharyngeal swab, presence of viral mutation(s) within the areas targeted by this assay, and inadequate number of viral copies(<138 copies/mL). A negative result must be combined with clinical observations, patient history, and epidemiological information. The expected result is Negative.  Fact Sheet for Patients:  EntrepreneurPulse.com.au  Fact Sheet for Healthcare Providers:  IncredibleEmployment.be  This test is no t yet approved or cleared by the [place] FDA and  has been authorized for detection and/or diagnosis of SARS-CoV-2 by FDA under an Emergency Use Authorization (EUA). This EUA will remain  in effect (meaning this test can be used) for the duration of the COVID-19 declaration under Section 564(b)(1) of the Act, 21 U.S.C.section 360bbb-3(b)(1), unless the authorization is  terminated  or revoked sooner.       Influenza A by PCR NEGATIVE NEGATIVE Final   Influenza B by PCR NEGATIVE NEGATIVE Final    Comment: (NOTE) The Xpert Xpress SARS-CoV-2/FLU/RSV plus assay is intended as an aid in the diagnosis of influenza from Nasopharyngeal swab specimens and should not be used as a sole basis for treatment. Nasal washings and aspirates are unacceptable for Xpert Xpress SARS-CoV-2/FLU/RSV testing.  Fact Sheet for Patients: EntrepreneurPulse.com.au  Fact Sheet for Healthcare Providers: IncredibleEmployment.be  This test is not yet approved or cleared by the [place] FDA and has been authorized for detection and/or diagnosis of SARS-CoV-2 by FDA under an Emergency Use Authorization (EUA). This EUA will remain in effect (meaning this test can be used) for the duration of the COVID-19 declaration under Section 564(b)(1) of the Act, 21 U.S.C. section 360bbb-3(b)(1), unless the authorization is terminated or revoked.  Performed at [facility], [place] [address], [place], [place] [address]       Imaging Studies   DG Chest Portable 1 View  Result Date: 09/02/2020 CLINICAL DATA:  Hypoxia. EXAM: PORTABLE CHEST 1 VIEW COMPARISON:  Aug 02, 2020 FINDINGS: Cardiomediastinal silhouette is normal. Mediastinal contours appear intact. There is no evidence of focal airspace consolidation, pleural effusion or pneumothorax. Severe emphysematous changes with large upper lobe predominant bullous formation. Osseous structures are without acute abnormality. Soft tissues are grossly normal. IMPRESSION: Severe emphysematous changes with large upper lobe predominant bullous formation. No evidence of focal consolidation. Electronically Signed   By: Fidela Salisbury M.D.   On: 09/02/2020 14:21     Medications   Scheduled Meds:  atorvastatin  10 mg Oral q1800   azithromycin  500 mg Oral Daily   benztropine  2 mg Oral BID    carvedilol  3.125 mg Oral BID WC   dextromethorphan-guaiFENesin  1 tablet Oral BID   enalapril  10 mg Oral Daily   enoxaparin (LOVENOX) injection  40 mg Subcutaneous Q24H   famotidine  40 mg Oral BID   ipratropium-albuterol  3 mL Nebulization Q4H   loratadine  10 mg Oral Daily   methylPREDNISolone (SOLU-MEDROL) injection  60 mg Intravenous Q12H   mirtazapine  30 mg Oral QHS   mometasone-formoterol  2 puff Inhalation BID   [START ON 09/06/2020] predniSONE  40 mg Oral Q breakfast   risperiDONE  3 mg Oral BID   tamsulosin  0.4 mg Oral QHS   Continuous Infusions:     LOS: 1 day    Time spent:  25 minutes with > 50% spent at bedside and in coordination of care.    Ezekiel Slocumb, DO [facility]  09/04/2020, 1:24 PM      If 7PM-7AM, please contact night-coverage. How to contact the [facility] Attending or Consulting provider [place] or covering provider during after hours [place], for this patient?    Check the care team in [facility] and look for a) attending/consulting TR[facility] provider listed and b) the [facility] team listed Log into [email or web address] and use [facility]'s universal password to access. If you do not have the password, please contact the hospital operator. Locate the [facility] provider you are looking for under [facility] and page to a number that you can be directly reached. If you still have difficulty reaching the provider, please page the [facility] (Director on Call) for the Hospitalists listed on amion for assistance.

## 2020-09-04 NOTE — Progress Notes (Addendum)
Pt pulled his monitor box and both Ivs out and refused to let this nurse to start a new IV or apply the monitor back. This nurse will try again later

## 2020-09-05 ENCOUNTER — Other Ambulatory Visit (HOSPITAL_COMMUNITY): Payer: Self-pay

## 2020-09-05 DIAGNOSIS — J441 Chronic obstructive pulmonary disease with (acute) exacerbation: Principal | ICD-10-CM

## 2020-09-05 LAB — BASIC METABOLIC PANEL
Anion gap: 10 (ref 5–15)
BUN: 8 mg/dL (ref 8–23)
CO2: 30 mmol/L (ref 22–32)
Calcium: 9.2 mg/dL (ref 8.9–10.3)
Chloride: 96 mmol/L — ABNORMAL LOW (ref 98–111)
Creatinine, Ser: 0.79 mg/dL (ref 0.61–1.24)
GFR, Estimated: >60 mL/min (ref >60)
Glucose, Bld: 122 mg/dL — ABNORMAL HIGH (ref 70–99)
Potassium: 3.5 mmol/L (ref 3.5–5.1)
Sodium: 136 mmol/L (ref 135–145)

## 2020-09-05 MED ORDER — IPRATROPIUM-ALBUTEROL 0.5-2.5 (3) MG/3ML IN SOLN
RESPIRATORY_TRACT | 2 refills | Status: DC
  Filled 2020-09-05: qty 90, 5d supply, fill #0
  Filled 2020-09-10: qty 180, 20d supply, fill #0
  Filled 2020-09-26: qty 180, 20d supply, fill #1
  Filled 2020-11-02: qty 180, 30d supply, fill #2

## 2020-09-05 MED ORDER — AZITHROMYCIN 500 MG PO TABS
500.0000 mg | ORAL_TABLET | Freq: Once | ORAL | 0 refills | Status: AC
  Filled 2020-09-05: qty 1, 1d supply, fill #0

## 2020-09-05 MED ORDER — PREDNISONE 10 MG PO TABS
ORAL_TABLET | ORAL | 0 refills | Status: AC
  Filled 2020-09-05: qty 22, 10d supply, fill #0

## 2020-09-05 MED ORDER — BENZONATATE 200 MG PO CAPS
200.0000 mg | ORAL_CAPSULE | Freq: Two times a day (BID) | ORAL | 0 refills | Status: DC | PRN
  Filled 2020-09-05: qty 20, 10d supply, fill #0

## 2020-09-05 NOTE — Discharge Instructions (Addendum)
Blake Mcknight,  You are in the hospital because of COPD exacerbation.  You are treated with increased oxygen, antibiotics, breathing treatments, steroids.  You have improved back towards your baseline.  It is important for you that you continue to quit smoking.  It is also important that you follow-up with your primary care/pulmonologist.  I am discharging you on 1 more day of antibiotics, azithromycin.  I am also discharging you on a prednisone taper over the next 10 days.  Please take your breathing treatment, DuoNeb, every 4 hours while awake for the next 2 days followed by every 4 hours as needed for shortness of breath.

## 2020-09-05 NOTE — Plan of Care (Signed)

## 2020-09-05 NOTE — Plan of Care (Signed)
  Problem: Education: Goal: Knowledge of disease or condition will improve 09/05/2020 1503 by Camillia Herter, RN Outcome: Adequate for Discharge 09/05/2020 1503 by Camillia Herter, RN Outcome: Progressing Goal: Knowledge of the prescribed therapeutic regimen will improve 09/05/2020 1503 by Camillia Herter, RN Outcome: Adequate for Discharge 09/05/2020 1503 by Camillia Herter, RN Outcome: Progressing Goal: Individualized Educational Video(s) 09/05/2020 1503 by Camillia Herter, RN Outcome: Adequate for Discharge 09/05/2020 1503 by Camillia Herter, RN Outcome: Progressing   Problem: Activity: Goal: Ability to tolerate increased activity will improve 09/05/2020 1503 by Camillia Herter, RN Outcome: Adequate for Discharge 09/05/2020 1503 by Camillia Herter, RN Outcome: Progressing Goal: Will verbalize the importance of balancing activity with adequate rest periods 09/05/2020 1503 by Camillia Herter, RN Outcome: Adequate for Discharge 09/05/2020 1503 by Camillia Herter, RN Outcome: Progressing   Problem: Respiratory: Goal: Ability to maintain a clear airway will improve 09/05/2020 1503 by Camillia Herter, RN Outcome: Adequate for Discharge 09/05/2020 1503 by Camillia Herter, RN Outcome: Progressing Goal: Levels of oxygenation will improve 09/05/2020 1503 by Camillia Herter, RN Outcome: Adequate for Discharge 09/05/2020 1503 by Camillia Herter, RN Outcome: Progressing Goal: Ability to maintain adequate ventilation will improve 09/05/2020 1503 by Camillia Herter, RN Outcome: Adequate for Discharge 09/05/2020 1503 by Camillia Herter, RN Outcome: Progressing

## 2020-09-05 NOTE — Progress Notes (Signed)
PT Cancellation Note  Patient Details Name: Blake Mcknight MRN: [id number] DOB: May 01, 1955   Cancelled Treatment:    Reason Eval/Treat Not Completed: Other (comment).  Pt politely declined PT this afternoon, reports he is too tired.   Ramond Dial 09/05/2020, 3:36 PM  Mee Hives, PT MS Acute Rehab Dept. Number: [place] and [place]

## 2020-09-05 NOTE — Consult Note (Signed)
   [facility] Inpatient Consult   09/05/2020  Blake Mcknight 03-04-1956 [id number]  [facility] [ACO] Patient: Medicare CMS DCE   Patient is currently active with [facility] for chronic disease management services.  Patient has been engaged by a [place].  Our community based plan of care has focused on disease management and community resource support.    Plan:  Will follow up with Inpatient Transition Of Care [TOC] team member to make aware that [facility] following. Follow patient's progress and disposition needs.  Of note, [facility] Care Management services does not replace or interfere with any services that are needed or arranged by inpatient [facility] care management team.  For additional questions or referrals please contact:   Natividad Brood, RN BSN [facility] Liaison  [phone] business mobile phone Toll free office [phone]  Fax number: [phone] [email or web address]

## 2020-09-05 NOTE — Discharge Summary (Signed)
Physician Discharge Summary  Blake Mcknight YQM:[id number] DOB: May 26, 1955 DOA: 09/02/2020  PCP: [name], MD  Admit date: 09/02/2020 Discharge date: 09/05/2020  Admitted From: Home Disposition: home  Recommendations for Outpatient Follow-up:  Follow up with PCP/Pulmonologist in 1 week Please follow up on the following pending results: None  Home Health: None Equipment/Devices: 4-wheel rolling walker  Discharge Condition: Stable CODE STATUS: DNR Diet recommendation: Regular   Brief/Interim Summary:  Admission HPI written by Ezekiel Slocumb, DO   HPI: Blake Mcknight is a 65 y.o. male with medical history significant of severe COPD with recurrent admissions for exacerbations, chronic respiratory failure with hypoxia on 2 L/min oxygen, schizophrenia, hyperlipidemia, hypertension presented to the ED today with severe progressive shortness of breath that started earlier.  Had been having increased wheezing for a few days got progressively worse.  Wife called 69 and when EMS arrived they found patient to be in severe respiratory distress with oxygen sat 86% on his usual 2 L/min oxygen.  He denies any fevers chills, cough or chest pain, nausea or vomiting or diarrhea.  Reports good appetite, no recent illnesses.  Patient was admitted 1 month ago for same.   EMS treated patient with IV steroids and magnesium multiple neb treatments without much improvement.  He reportedly became extremely dyspneic with transferring to the stretcher for EMS and was tripoding.   Hospital course:  COPD exacerbation Bullous emphysema Unknown trigger.  Patient appears to have severe COPD disease.  Patient initially treated by EMS with IV steroids, IV magnesium, nebulizer treatments.  Patient received a 1 hour continuous albuterol nebulizer treatment while in the emergency department with improvement of symptoms.  While admitted, patient was continued on DuoNeb nebulizer treatments in  addition to Solu-Medrol and azithromycin.  Solu-Medrol transition to prednisone prior to discharge.  Patient to continue azithromycin to continue 5 days of antibiotics.  Patient discharged on prednisone with a prolonged taper.  Patient has apparently quit smoking for last several weeks.  Acute on chronic respiratory failure with hypoxia Secondary to COPD exacerbation.  Patient needing as much as 10 L/min of oxygen.  This was weaned back down to his baseline of 2 L/min of oxygen via nasal cannula.  Primary hypertension Continue enalapril  Hyperlipidemia Continue atorvastatin  Agitation Transient.  Patient treated with as needed Seroquel/Haldol.  Resolved.  Schizophrenia Continue risperidone, Remeron, Cogentin  Allergic rhinitis Continue cetirizine and montelukast  BPH Continue tamsulosin  Discharge Diagnoses:  Active Problems:   COPD with acute exacerbation [facility])    Discharge Instructions   Allergies as of 09/05/2020   No Known Allergies      Medication List     STOP taking these medications    doxycycline 100 MG capsule Commonly known as: VIBRAMYCIN   Fluticasone-Salmeterol 250-50 MCG/DOSE Aepb Commonly known as: Advair Diskus       TAKE these medications    albuterol 108 (90 Base) MCG/ACT inhaler Commonly known as: VENTOLIN HFA Inhale 2 puffs into the lungs every 4 (four) hours as needed for wheezing or shortness of breath. What changed: Another medication with the same name was removed. Continue taking this medication, and follow the directions you see here.   atorvastatin 10 MG tablet Commonly known as: LIPITOR Take 10 mg by mouth daily at 6 PM.   azithromycin 500 MG tablet Commonly known as: ZITHROMAX Take 1 tablet (500 mg total) by mouth once for 1 dose. Start taking on: September 06, 2020   B-12 PO Take  1 tablet by mouth every other day.   benzonatate 200 MG capsule Commonly known as: TESSALON Take 1 capsule (200 mg total) by mouth 2 (two) times  daily as needed for cough.   Breztri Aerosphere 160-9-4.8 MCG/ACT Aero Generic drug: Budeson-Glycopyrrol-Formoterol Inhale 2 puffs into the lungs in the morning and at bedtime.   cetirizine 10 MG tablet Commonly known as: ZYRTEC Take 10 mg by mouth daily as needed for allergies or rhinitis.   cholecalciferol 25 MCG (1000 UNIT) tablet Commonly known as: VITAMIN D Take 4,000 Units by mouth every evening.   enalapril 10 MG tablet Commonly known as: VASOTEC Take 10 mg by mouth daily.   famotidine 40 MG tablet Commonly known as: PEPCID Take 40 mg by mouth 2 (two) times daily.   guaiFENesin-dextromethorphan 100-10 MG/5ML syrup Commonly known as: ROBITUSSIN DM Take 10 mLs by mouth every 4 (four) hours as needed for cough.   HYDROCODONE-CHLORPHENIRAMINE PO Take 5 mLs by mouth every 6 (six) hours as needed (cough and pain).   ipratropium-albuterol 0.5-2.5 (3) MG/3ML Soln Commonly known as: DUONEB Take 3 mLs by nebulization every 4 (four) hours for 2 days, THEN 3 mLs every 4 (four) hours as needed (shortness of breath). Start taking on: September 05, 2020   mirtazapine 30 MG tablet Commonly known as: REMERON Take 1 tablet (30 mg total) by mouth at bedtime.   montelukast 10 MG tablet Commonly known as: SINGULAIR Take 1 tablet (10 mg total) by mouth daily.   multivitamin Tabs tablet Take 1 tablet by mouth daily.   OXYGEN Inhale 2 L/min into the lungs continuous.   polyethylene glycol 17 g packet Commonly known as: MIRALAX / GLYCOLAX Take 17 g by mouth daily as needed for mild constipation.   predniSONE 10 MG tablet Commonly known as: DELTASONE Take 4 tablets (40 mg total) by mouth daily with breakfast for 2 days, THEN 3 tablets (30 mg total) daily with breakfast for 2 days, THEN 2 tablets (20 mg total) daily with breakfast for 2 days, THEN 1 tablet (10 mg total) daily with breakfast for 4 days. Start taking on: September 06, 2020 What changed: See the new instructions.    risperiDONE 3 MG tablet Commonly known as: RISPERDAL Take 1 tablet (3 mg total) by mouth 2 (two) times daily.   sodium chloride 0.65 % Soln nasal spray Commonly known as: OCEAN Place 1 spray into both nostrils as needed for congestion.   tamsulosin 0.4 MG Caps capsule Commonly known as: FLOMAX Take 0.4 mg by mouth at bedtime.   VITAMIN C PO Take 1 tablet by mouth 3 (three) times a week.       ASK your doctor about these medications    benztropine 2 MG tablet Commonly known as: COGENTIN Take 1 tablet (2 mg total) by mouth 2 (two) times daily as needed for tremors.   carvedilol 3.125 MG tablet Commonly known as: COREG Take 1 tablet (3.125 mg total) by mouth 2 (two) times daily with a meal.        Follow-up Information     [name], MD. Schedule an appointment as soon as possible for a visit in 1 week(s).   Specialty: Pulmonary Disease Why: For hospital follow-up Contact information: [place] [address] [phone]                No Known Allergies  Consultations: None   Procedures/Studies: DG Chest Portable 1 View  Result Date: 09/02/2020 CLINICAL DATA:  Hypoxia.  EXAM: PORTABLE CHEST 1 VIEW COMPARISON:  Aug 02, 2020 FINDINGS: Cardiomediastinal silhouette is normal. Mediastinal contours appear intact. There is no evidence of focal airspace consolidation, pleural effusion or pneumothorax. Severe emphysematous changes with large upper lobe predominant bullous formation. Osseous structures are without acute abnormality. Soft tissues are grossly normal. IMPRESSION: Severe emphysematous changes with large upper lobe predominant bullous formation. No evidence of focal consolidation. Electronically Signed   By: Fidela Salisbury M.D.   On: 09/02/2020 14:21      Subjective: Patient reports no dyspnea.  He states that he is at his baseline.  Discharge Exam: Vitals:   09/05/20 1137 09/05/20 1300  BP:  (!) 151/81  Pulse:   (!) 111  Resp:  16  Temp:  97.7 F (36.5 C)  SpO2: 98% 100%   Vitals:   09/05/20 0640 09/05/20 0852 09/05/20 1137 09/05/20 1300  BP: (!) 170/90   (!) 151/81  Pulse: 79   (!) 111  Resp: 18   16  Temp: 98.2 F (36.8 C)   97.7 F (36.5 C)  TempSrc: Oral   Oral  SpO2: 100% 94% 98% 100%  Weight:      Height:        General: Pt is alert, awake, not in acute distress Cardiovascular: RRR, S1/S2 +, no rubs, no gallops Respiratory: Patient with diminished breath sounds bilaterally, mild wheezing, no rhonchi Abdominal: Soft, NT, ND, bowel sounds + Extremities: no edema, no cyanosis    The results of significant diagnostics from this hospitalization (including imaging, microbiology, ancillary and laboratory) are listed below for reference.     Microbiology: Recent Results (from the past 240 hour(s))  Resp Panel by RT-PCR (Flu A&B, Covid) Nasopharyngeal Swab     Status: None   Collection Time: 09/02/20  2:00 PM   Specimen: Nasopharyngeal Swab; Nasopharyngeal(NP) swabs in vial transport medium  Result Value Ref Range Status   SARS Coronavirus 2 by RT PCR NEGATIVE NEGATIVE Final    Comment: (NOTE) SARS-CoV-2 target nucleic acids are NOT DETECTED.  The SARS-CoV-2 RNA is generally detectable in upper respiratory specimens during the acute phase of infection. The lowest concentration of SARS-CoV-2 viral copies this assay can detect is 138 copies/mL. A negative result does not preclude SARS-Cov-2 infection and should not be used as the sole basis for treatment or other patient management decisions. A negative result may occur with  improper specimen collection/handling, submission of specimen other than nasopharyngeal swab, presence of viral mutation(s) within the areas targeted by this assay, and inadequate number of viral copies(<138 copies/mL). A negative result must be combined with clinical observations, patient history, and epidemiological information. The expected result is  Negative.  Fact Sheet for Patients:  EntrepreneurPulse.com.au  Fact Sheet for Healthcare Providers:  IncredibleEmployment.be  This test is no t yet approved or cleared by the [place] FDA and  has been authorized for detection and/or diagnosis of SARS-CoV-2 by FDA under an Emergency Use Authorization (EUA). This EUA will remain  in effect (meaning this test can be used) for the duration of the COVID-19 declaration under Section 564(b)(1) of the Act, 21 U.S.C.section 360bbb-3(b)(1), unless the authorization is terminated  or revoked sooner.       Influenza A by PCR NEGATIVE NEGATIVE Final   Influenza B by PCR NEGATIVE NEGATIVE Final    Comment: (NOTE) The Xpert Xpress SARS-CoV-2/FLU/RSV plus assay is intended as an aid in the diagnosis of influenza from Nasopharyngeal swab specimens and should not be used as a sole  basis for treatment. Nasal washings and aspirates are unacceptable for Xpert Xpress SARS-CoV-2/FLU/RSV testing.  Fact Sheet for Patients: EntrepreneurPulse.com.au  Fact Sheet for Healthcare Providers: IncredibleEmployment.be  This test is not yet approved or cleared by the [place] FDA and has been authorized for detection and/or diagnosis of SARS-CoV-2 by FDA under an Emergency Use Authorization (EUA). This EUA will remain in effect (meaning this test can be used) for the duration of the COVID-19 declaration under Section 564(b)(1) of the Act, 21 U.S.C. section 360bbb-3(b)(1), unless the authorization is terminated or revoked.  Performed at [facility], [place] [address], [place], [place] [address]      Labs: BNP (last 3 results) Recent Labs    06/15/20 1439 08/02/20 1410  BNP 22.2 55.7   Basic Metabolic Panel: Recent Labs  Lab 09/02/20 1344 09/05/20 0205  NA 132* 136  K 4.2 3.5  CL 99 96*  CO2 26 30  GLUCOSE 103* 122*  BUN 6* 8  CREATININE 0.78 0.79   CALCIUM 8.8* 9.2   Liver Function Tests: No results for input(s): AST, ALT, ALKPHOS, BILITOT, PROT, ALBUMIN in the last 168 hours. No results for input(s): LIPASE, AMYLASE in the last 168 hours. No results for input(s): AMMONIA in the last 168 hours. CBC: Recent Labs  Lab 09/02/20 1344 09/03/20 0038  WBC 8.8 10.0  NEUTROABS 4.4  --   HGB 14.7 13.7  HCT 44.0 40.5  MCV 90.5 88.4  PLT 238 230   Cardiac Enzymes: No results for input(s): CKTOTAL, CKMB, CKMBINDEX, TROPONINI in the last 168 hours. BNP: Invalid input(s): POCBNP CBG: No results for input(s): GLUCAP in the last 168 hours. D-Dimer No results for input(s): DDIMER in the last 72 hours. Hgb A1c No results for input(s): HGBA1C in the last 72 hours. Lipid Profile No results for input(s): CHOL, HDL, LDLCALC, TRIG, CHOLHDL, LDLDIRECT in the last 72 hours. Thyroid function studies No results for input(s): TSH, T4TOTAL, T3FREE, THYROIDAB in the last 72 hours.  Invalid input(s): FREET3 Anemia work up No results for input(s): VITAMINB12, FOLATE, FERRITIN, TIBC, IRON, RETICCTPCT in the last 72 hours. Urinalysis    Component Value Date/Time   COLORURINE STRAW (A) 06/15/2020 1434   APPEARANCEUR CLEAR 06/15/2020 1434   LABSPEC 1.005 06/15/2020 1434   PHURINE 7.0 06/15/2020 1434   GLUCOSEU NEGATIVE 06/15/2020 1434   HGBUR NEGATIVE 06/15/2020 1434   BILIRUBINUR NEGATIVE 06/15/2020 1434   KETONESUR NEGATIVE 06/15/2020 1434   PROTEINUR NEGATIVE 06/15/2020 1434   UROBILINOGEN 0.2 01/29/2019 1634   NITRITE NEGATIVE 06/15/2020 1434   LEUKOCYTESUR NEGATIVE 06/15/2020 1434   Sepsis Labs Invalid input(s): PROCALCITONIN,  WBC,  LACTICIDVEN Microbiology Recent Results (from the past 240 hour(s))  Resp Panel by RT-PCR (Flu A&B, Covid) Nasopharyngeal Swab     Status: None   Collection Time: 09/02/20  2:00 PM   Specimen: Nasopharyngeal Swab; Nasopharyngeal(NP) swabs in vial transport medium  Result Value Ref Range Status   SARS  Coronavirus 2 by RT PCR NEGATIVE NEGATIVE Final    Comment: (NOTE) SARS-CoV-2 target nucleic acids are NOT DETECTED.  The SARS-CoV-2 RNA is generally detectable in upper respiratory specimens during the acute phase of infection. The lowest concentration of SARS-CoV-2 viral copies this assay can detect is 138 copies/mL. A negative result does not preclude SARS-Cov-2 infection and should not be used as the sole basis for treatment or other patient management decisions. A negative result may occur with  improper specimen collection/handling, submission of specimen other than nasopharyngeal swab,  presence of viral mutation(s) within the areas targeted by this assay, and inadequate number of viral copies(<138 copies/mL). A negative result must be combined with clinical observations, patient history, and epidemiological information. The expected result is Negative.  Fact Sheet for Patients:  EntrepreneurPulse.com.au  Fact Sheet for Healthcare Providers:  IncredibleEmployment.be  This test is no t yet approved or cleared by the [place] FDA and  has been authorized for detection and/or diagnosis of SARS-CoV-2 by FDA under an Emergency Use Authorization (EUA). This EUA will remain  in effect (meaning this test can be used) for the duration of the COVID-19 declaration under Section 564(b)(1) of the Act, 21 U.S.C.section 360bbb-3(b)(1), unless the authorization is terminated  or revoked sooner.       Influenza A by PCR NEGATIVE NEGATIVE Final   Influenza B by PCR NEGATIVE NEGATIVE Final    Comment: (NOTE) The Xpert Xpress SARS-CoV-2/FLU/RSV plus assay is intended as an aid in the diagnosis of influenza from Nasopharyngeal swab specimens and should not be used as a sole basis for treatment. Nasal washings and aspirates are unacceptable for Xpert Xpress SARS-CoV-2/FLU/RSV testing.  Fact Sheet for  Patients: EntrepreneurPulse.com.au  Fact Sheet for Healthcare Providers: IncredibleEmployment.be  This test is not yet approved or cleared by the [place] FDA and has been authorized for detection and/or diagnosis of SARS-CoV-2 by FDA under an Emergency Use Authorization (EUA). This EUA will remain in effect (meaning this test can be used) for the duration of the COVID-19 declaration under Section 564(b)(1) of the Act, 21 U.S.C. section 360bbb-3(b)(1), unless the authorization is terminated or revoked.  Performed at [facility], [place] [address], [place], [place] [address]      Time coordinating discharge: 35 minutes  SIGNED:   Cordelia Poche, MD [facility] 09/05/2020, 2:08 PM

## 2020-09-07 ENCOUNTER — Other Ambulatory Visit: Payer: Self-pay | Admitting: *Deleted

## 2020-09-07 NOTE — Patient Outreach (Signed)
[name]) Care Management  09/07/2020  Blake Mcknight 04/05/1955 [id number]   Noted member readmitted to hospital 6/19-6/22 for COPD.  Call placed to sister, state member is doing better.  Still has a slight cough but shortness of breath is back to normal.  Denies any urgent concerns, encouraged to contact this care manager with questions.  Agrees to follow up within the next month.    Goals Addressed             This Visit's Progress    [facility] - Make and Keep All Appointments   On track    Follow Up Date 11/30  Timeframe:  Short-Term Goal Priority:  High Start Date:           5/24                  Expected End Date:  6/24  Barriers: Health Behaviors                        - ask family or friend for a ride - keep a calendar with appointment dates    Why is this important?   Part of staying healthy is seeing the doctor for follow-up care.  If you forget your appointments, there are some things you can do to stay on track.    Notes:   11/11 - Sister will call to schedule PCP appointment   5/24 - Patient admitted to hospital for COPD exacerbation 5/19-5/21.  Sister will call PCP to schedule appointment today  6/7 - Sister report follow up appointment with PCP today.  Will also make appointments with urology and eye doctor  6/24 - sister called PCP since recent discharge, no appointment scheduled yet, waiting for nurse to return to office as requested by PCP      [facility] - Symptom Exacerbation Prevented or Minimized   Not on track    Timeframe:  Long-Range Goal Priority:  High Start Date:      [id number]                       Expected End Date:     [id number]                 Follow up date: 6/7  Barriers: Knowledge   Notes: 12/13 - Long term COPD management discussed with sister, discussed management of inhalers and nebulizer [phone] Patient is still smoking randomly per sister not every day and just taking a puff.    5/24 - Patient admitted  to hospital for COPD exacerbation 5/19-5/21.  Reviewed discharge instructions with sister, report he is taking meds and using inhaler/nebulizer as instructed.  6/7 - Per sister, patient currently denies any shortness of breath, will continue medication management as prescribed  6/24 - Patient readmitted to hospital 6/19-6/22, most recent discharge instructions reviewed with sister.  Confirms new medications has started, will continue to monitor.          [name], [place], MSN [place] [phone]

## 2020-09-10 ENCOUNTER — Other Ambulatory Visit (HOSPITAL_COMMUNITY): Payer: Self-pay

## 2020-09-12 ENCOUNTER — Other Ambulatory Visit (HOSPITAL_COMMUNITY): Payer: Self-pay

## 2020-09-13 ENCOUNTER — Other Ambulatory Visit: Payer: Self-pay | Admitting: *Deleted

## 2020-09-13 NOTE — Patient Outreach (Signed)
[facility]) Care Management  09/13/2020  CHEROKEE CLOWERS 29-May-1955 [id number]   [facility] multidisciplinary [facility] discussion  Case information shared during the team discussion  Plans  Covering [facility] RN CM will updated assigned [facility] RN CM on discussion outcome for follow up with outreach as pended  Joelene Millin L. Lavina Hamman, RN, BSN, [place] Coordinator Office number [phone] [facility] number [phone] Fax number [phone]

## 2020-09-20 ENCOUNTER — Encounter: Payer: Self-pay | Admitting: *Deleted

## 2020-09-20 NOTE — Telephone Encounter (Signed)
This encounter was created in error - please disregard.

## 2020-09-26 ENCOUNTER — Other Ambulatory Visit (HOSPITAL_COMMUNITY): Payer: Self-pay

## 2020-10-01 ENCOUNTER — Other Ambulatory Visit (HOSPITAL_COMMUNITY): Payer: Self-pay

## 2020-10-04 ENCOUNTER — Other Ambulatory Visit: Payer: Self-pay | Admitting: *Deleted

## 2020-10-04 NOTE — Patient Outreach (Signed)
[facility]) Care Management  10/04/2020  GEOFFRY BANNISTER 08-21-1955 [id number]   Outgoing call placed to [facility] caregiver/sister, no answer.  Unable to leave voice message.  Will follow up within the next 3-4 business days.  [name], [place], MSN [place] [phone]

## 2020-10-09 ENCOUNTER — Other Ambulatory Visit: Payer: Self-pay | Admitting: *Deleted

## 2020-10-09 NOTE — Patient Outreach (Signed)
[facility]) Care Management  10/09/2020  Blake Mcknight 1955-08-08 [id number]   Outreach attempt #2, successful to sister.  Report member is doing well, denies any current shortness of breath.  Denies any urgent concerns, encouraged to contact this care manager with questions.  Agrees to follow up within the next month.   Goals Addressed             This Visit's Progress    [facility] - Make and Keep All Appointments   On track      Timeframe:  Short-Term Goal Priority:  High Start Date:           5/24                  Expected End Date:  8/24  Barriers: Health Behaviors                        - ask family or friend for a ride - keep a calendar with appointment dates    Why is this important?   Part of staying healthy is seeing the doctor for follow-up care.  If you forget your appointments, there are some things you can do to stay on track.    Notes:   11/11 - Sister will call to schedule PCP appointment   5/24 - Patient admitted to hospital for COPD exacerbation 5/19-5/21.  Sister will call PCP to schedule appointment today  6/7 - Sister report follow up appointment with PCP today.  Will also make appointments with urology and eye doctor  6/24 - sister called PCP since recent discharge, no appointment scheduled yet, waiting for nurse to return to office as requested by PCP  7/26 - Sister report member has had telephone visit, will have face to face visit on 8/16     [facility] - Symptom Exacerbation Prevented or Minimized   On track    Timeframe:  Long-Range Goal Priority:  High Start Date:      [id number]                       Expected End Date:     [id number]                   Barriers: Knowledge   Notes: 12/13 - Long term COPD management discussed with sister, discussed management of inhalers and nebulizer [phone] Patient is still smoking randomly per sister not every day and just taking a puff.    5/24 - Patient admitted to hospital for COPD  exacerbation 5/19-5/21.  Reviewed discharge instructions with sister, report he is taking meds and using inhaler/nebulizer as instructed.  6/7 - Per sister, patient currently denies any shortness of breath, will continue medication management as prescribed  6/24 - Patient readmitted to hospital 6/19-6/22, most recent discharge instructions reviewed with sister.  Confirms new medications has started, will continue to monitor.    56 - Per sister, member has been steadily improving. Using inhalers and nebulizers as instructed, no longer smoking.       [name], [place], MSN [place] [phone]

## 2020-10-25 ENCOUNTER — Telehealth (HOSPITAL_COMMUNITY): Payer: Self-pay | Admitting: *Deleted

## 2020-10-25 ENCOUNTER — Encounter (HOSPITAL_COMMUNITY): Payer: Self-pay | Admitting: Psychiatry

## 2020-10-25 ENCOUNTER — Telehealth (INDEPENDENT_AMBULATORY_CARE_PROVIDER_SITE_OTHER): Payer: Medicare Other | Admitting: Psychiatry

## 2020-10-25 ENCOUNTER — Other Ambulatory Visit: Payer: Self-pay

## 2020-10-25 DIAGNOSIS — F200 Paranoid schizophrenia: Secondary | ICD-10-CM | POA: Diagnosis not present

## 2020-10-25 DIAGNOSIS — F99 Mental disorder, not otherwise specified: Secondary | ICD-10-CM | POA: Diagnosis not present

## 2020-10-25 DIAGNOSIS — F5105 Insomnia due to other mental disorder: Secondary | ICD-10-CM

## 2020-10-25 MED ORDER — BENZTROPINE MESYLATE 2 MG PO TABS: 2.0000 mg | ORAL_TABLET | Freq: Two times a day (BID) | ORAL | 2 refills | Status: DC

## 2020-10-25 MED ORDER — MIRTAZAPINE 30 MG PO TABS
30.0000 mg | ORAL_TABLET | Freq: Every day | ORAL | 0 refills | Status: DC
Start: 2020-10-25 — End: 2021-01-23

## 2020-10-25 MED ORDER — RISPERIDONE 3 MG PO TABS: 3.0000 mg | ORAL_TABLET | Freq: Two times a day (BID) | ORAL | 0 refills | Status: DC

## 2020-10-25 NOTE — Telephone Encounter (Signed)
Pt sister called asking if all his meds could be sent in for #180 as the Risperdal has, not realizing all scripts today were sent in for 90 days. So she wants the Cogentin to be written for #180 instead of #60 with two fills. Review and advise. Thanks.

## 2020-10-25 NOTE — Progress Notes (Signed)
Virtual Visit via Telephone Note  I connected with Blake Mcknight on 10/25/20 at  4:20 PM EDT by telephone and verified that I am speaking with the correct person using two identifiers.  Location: Patient: Home Provider: Office   I discussed the limitations, risks, security and privacy concerns of performing an evaluation and management service by telephone and the availability of in person appointments. I also discussed with the patient that there may be a patient responsible charge related to this service. The patient expressed understanding and agreed to proceed.   History of Present Illness: Patient is evaluated on the phone with the help of her sister going who was present there.  Patient was admitted on the medical floor back in June because of exacerbation of COPD.  He is doing better with his breathing but his sister endorse he gets very anxious and nervous when he started COPD flareup.  He does talk to himself but it is chronic and he does not get agitated irritable and he sleeps good at night.  Patient is a poor historian.  He does require help for his ADLs and his memory and concentration is not as good.  The sister take cares of him.  We have cut down the Cogentin on the last visit as patient complaining of constipation and dry mouth but his tremors get worse and his sister put him back on benztropine 2 mg twice a day.  His tremors are much better now.  Sister now very reluctant to cut down the medication and like to keep the Risperdal and Cogentin along with mirtazapine in the same dose.  His behavior is manageable.  He does require a lot of assistance.  Patient denies any suicidal thoughts or homicidal thought.     Past Psychiatric History: Reviewed. H/O schizophrenia and admitted at [facility] ago. H/O paranoid, delusional, disorganized behavior and having mood swings.  Psychiatric Specialty Exam: Physical Exam  Review of Systems  Weight 156 lb (70.8 kg).There is no  height or weight on file to calculate BMI.  General Appearance: NA  Eye Contact:  NA  Speech:  Slow  Volume:  Decreased  Mood:  Euthymic  Affect:  NA  Thought Process:  Descriptions of Associations: Loose  Orientation:  Full (Time, Place, and Person)  Thought Content:  Rumination and talk to him self  Suicidal Thoughts:  No  Homicidal Thoughts:  No  Memory:  Immediate;   Fair Recent;   Poor Remote;   Poor  Judgement:  Fair  Insight:  Shallow  Psychomotor Activity:  Tremor  Concentration:  Concentration: Poor and Attention Span: Fair  Recall:  Poor  Fund of Knowledge:  Fair  Language:  Fair  Akathisia:  No  Handed:  Right  AIMS (if indicated):     Assets:  Desire for Improvement Housing Social Support  ADL's:  Impaired  Cognition:  Impaired,  Mild  Sleep:   ok      Assessment and Plan: Schizophrenia chronic paranoid type.  Insomnia due to mental disorder.  Review labs from recent hospitalization as patient was admitted due to exacerbation of COPD.  We tried to cut down the Cogentin to help the constipation but her tremors get worse.  We will continue Cogentin 2 mg twice a day, Risperdal 3 mg twice a day and mirtazapine 30 mg at bedtime.  Recommended to call us back if is any question or any concern.  Follow-up in 3 months.  Follow Up Instructions:  I discussed the assessment and treatment plan with the patient. The patient was provided an opportunity to ask questions and all were answered. The patient agreed with the plan and demonstrated an understanding of the instructions.   The patient was advised to call back or seek an in-person evaluation if the symptoms worsen or if the condition fails to improve as anticipated.  I provided 15 minutes of non-face-to-face time during this encounter.   Kathlee Nations, MD

## 2020-10-26 MED ORDER — BENZTROPINE MESYLATE 2 MG PO TABS
2.0000 mg | ORAL_TABLET | Freq: Two times a day (BID) | ORAL | 0 refills | Status: DC
Start: 2020-10-26 — End: 2021-01-23

## 2020-10-26 NOTE — Telephone Encounter (Signed)
Done

## 2020-10-30 DIAGNOSIS — Z125 Encounter for screening for malignant neoplasm of prostate: Secondary | ICD-10-CM | POA: Diagnosis not present

## 2020-10-30 DIAGNOSIS — I119 Hypertensive heart disease without heart failure: Secondary | ICD-10-CM | POA: Diagnosis not present

## 2020-10-30 DIAGNOSIS — J441 Chronic obstructive pulmonary disease with (acute) exacerbation: Secondary | ICD-10-CM | POA: Diagnosis not present

## 2020-10-30 DIAGNOSIS — K2100 Gastro-esophageal reflux disease with esophagitis, without bleeding: Secondary | ICD-10-CM | POA: Diagnosis not present

## 2020-10-30 DIAGNOSIS — E559 Vitamin D deficiency, unspecified: Secondary | ICD-10-CM | POA: Diagnosis not present

## 2020-10-30 DIAGNOSIS — Z79899 Other long term (current) drug therapy: Secondary | ICD-10-CM | POA: Diagnosis not present

## 2020-10-30 DIAGNOSIS — M2550 Pain in unspecified joint: Secondary | ICD-10-CM | POA: Diagnosis not present

## 2020-10-30 DIAGNOSIS — E7800 Pure hypercholesterolemia, unspecified: Secondary | ICD-10-CM | POA: Diagnosis not present

## 2020-10-30 DIAGNOSIS — K267 Chronic duodenal ulcer without hemorrhage or perforation: Secondary | ICD-10-CM | POA: Diagnosis not present

## 2020-10-30 DIAGNOSIS — F319 Bipolar disorder, unspecified: Secondary | ICD-10-CM | POA: Diagnosis not present

## 2020-10-30 DIAGNOSIS — R7302 Impaired glucose tolerance (oral): Secondary | ICD-10-CM | POA: Diagnosis not present

## 2020-10-30 DIAGNOSIS — M899 Disorder of bone, unspecified: Secondary | ICD-10-CM | POA: Diagnosis not present

## 2020-10-30 DIAGNOSIS — M159 Polyosteoarthritis, unspecified: Secondary | ICD-10-CM | POA: Diagnosis not present

## 2020-11-01 ENCOUNTER — Other Ambulatory Visit: Payer: Self-pay | Admitting: *Deleted

## 2020-11-01 NOTE — Patient Outreach (Signed)
[facility]) Care Management  [place]  11/02/2020   DACOTAH GOAD [birth year] [id number]   Outgoing call placed to member's sister, state member is doing well.  Denies any urgent concerns, encouraged to contact this care manager with questions.    Encounter Medications:  Outpatient Encounter Medications as of 11/01/2020  Medication Sig   albuterol (VENTOLIN HFA) 108 (90 Base) MCG/ACT inhaler Inhale 2 puffs into the lungs every 4 (four) hours as needed for wheezing or shortness of breath.   Ascorbic Acid (VITAMIN C PO) Take 1 tablet by mouth 3 (three) times a week.    atorvastatin (LIPITOR) 10 MG tablet Take 10 mg by mouth daily at 6 PM.    benzonatate (TESSALON) 200 MG capsule Take 1 capsule (200 mg total) by mouth 2 (two) times daily as needed for cough.   benztropine (COGENTIN) 2 MG tablet Take 1 tablet (2 mg total) by mouth 2 (two) times daily.   BREZTRI AEROSPHERE 160-9-4.8 MCG/ACT AERO Inhale 2 puffs into the lungs in the morning and at bedtime.   carvedilol (COREG) 3.125 MG tablet Take 1 tablet (3.125 mg total) by mouth 2 (two) times daily with a meal. (Patient taking differently: Take 1.5625-3.125 mg by mouth See admin instructions. Take one tablet by mouth daily in the morning and one-half tablet by mouth daily in the evening.)   cetirizine (ZYRTEC) 10 MG tablet Take 10 mg by mouth daily as needed for allergies or rhinitis.   cholecalciferol (VITAMIN D) 25 MCG (1000 UNIT) tablet Take 4,000 Units by mouth every evening.    Cyanocobalamin (B-12 PO) Take 1 tablet by mouth every other day.   enalapril (VASOTEC) 10 MG tablet Take 10 mg by mouth daily.   famotidine (PEPCID) 40 MG tablet Take 40 mg by mouth 2 (two) times daily.   guaiFENesin-dextromethorphan (ROBITUSSIN DM) 100-10 MG/5ML syrup Take 10 mLs by mouth every 4 (four) hours as needed for cough.   HYDROCODONE-CHLORPHENIRAMINE PO Take 5 mLs by mouth every 6 (six) hours as needed (cough and pain).    ipratropium-albuterol (DUONEB) 0.5-2.5 (3) MG/3ML SOLN Use 1 vial by nebulization every 4 (four) hours for 2 days, THEN use 1 vial every 4 (four) hours as needed (shortness of breath).   mirtazapine (REMERON) 30 MG tablet Take 1 tablet (30 mg total) by mouth at bedtime.   montelukast (SINGULAIR) 10 MG tablet Take 1 tablet (10 mg total) by mouth daily. (Patient not taking: Reported on 09/04/2020)   multivitamin (ONE-A-DAY MEN'S) TABS tablet Take 1 tablet by mouth daily.   OXYGEN Inhale 2 L/min into the lungs continuous.    polyethylene glycol (MIRALAX / GLYCOLAX) 17 g packet Take 17 g by mouth daily as needed for mild constipation.   risperiDONE (RISPERDAL) 3 MG tablet Take 1 tablet (3 mg total) by mouth 2 (two) times daily.   sodium chloride (OCEAN) 0.65 % SOLN nasal spray Place 1 spray into both nostrils as needed for congestion.   tamsulosin (FLOMAX) 0.4 MG CAPS capsule Take 0.4 mg by mouth at bedtime.   No facility-administered encounter medications on file as of 11/01/2020.    Functional Status:  In your present state of health, do you have any difficulty performing the following activities: 08/03/2020 08/03/2020  Hearing? - N  Vision? - N  Difficulty concentrating or making decisions? - Y  Walking or climbing stairs? - Y  Dressing or bathing? - Y  Doing errands, shopping? Y Y  Some recent data might be  hidden    Fall/Depression Screening: Fall Risk  08/07/2020 01/06/2020 07/08/2019  Falls in the past year? 0 1 0  Number falls in past yr: 0 0 0  Injury with Fall? 0 0 0  Follow up Falls evaluation completed - -   PHQ 2/9 Scores 08/07/2020 07/08/2019  Exception Documentation Medical reason Medical reason    Assessment:   Care Plan There are no care plans that you recently modified to display for this patient.    Goals Addressed             This Visit's Progress    THN - Make and Keep All Appointments   On track      Timeframe:  Short-Term Goal Priority:  High Start  Date:           5/24                  Expected End Date:  8/24  Barriers: Health Behaviors                        - ask family or friend for a ride - keep a calendar with appointment dates    Why is this important?   Part of staying healthy is seeing the doctor for follow-up care.  If you forget your appointments, there are some things you can do to stay on track.    Notes:   11/11 - Sister will call to schedule PCP appointment   5/24 - Patient admitted to hospital for COPD exacerbation 5/19-5/21.  Sister will call PCP to schedule appointment today  6/7 - Sister report follow up appointment with PCP today.  Will also make appointments with urology and eye doctor  6/24 - sister called PCP since recent discharge, no appointment scheduled yet, waiting for nurse to return to office as requested by PCP  7/26 - Sister report member has had telephone visit, wilface to face visit on 8/16  8/18 - State office visit with PCP went well, per sister, MD was pleased with lung No change in medication regime     THN - Symptom Exacerbation Prevented or Minimized   On track    Timeframe:  Long-Range Goal Priority:  High Start Date:      [id number]                       Expected End Date:     [id number]                   Barriers: Knowledge   Notes: 12/13 - Long term COPD management discussed with sister, discussed management of inhalers and nebulizer [phone] Patient is still smoking randomly per sister not every day and just taking a puff.    5/24 - Patient admitted to hospital for COPD exacerbation 5/19-5/21.  Reviewed discharge instructions with sister, report he is taking meds and using inhaler/nebulizer as instructed.  6/7 - Per sister, patient currently denies any shortness of breath, will continue medication management as prescribed  6/24 - Patient readmitted to hospital 6/19-6/22, most recent discharge instructions reviewed with sister.  Confirms new medications has started, will  continue to monitor.    84 - Per sister, member has been steadily improving. Using inhalers and nebulizers as instructed, no longer smoking.  8/18 - No current issues with shortness of breath or chest discomfort.          Plan:  Follow-up: Patient agrees to  Care Plan and Follow-up. Follow-up in 1 month(s).  [name], [place], MSN [place] [phone]

## 2020-11-02 ENCOUNTER — Other Ambulatory Visit (HOSPITAL_COMMUNITY): Payer: Self-pay

## 2020-11-07 ENCOUNTER — Other Ambulatory Visit (HOSPITAL_COMMUNITY): Payer: Self-pay

## 2020-11-20 ENCOUNTER — Emergency Department (HOSPITAL_COMMUNITY): Payer: Medicare Other

## 2020-11-20 ENCOUNTER — Other Ambulatory Visit: Payer: Self-pay

## 2020-11-20 ENCOUNTER — Observation Stay (HOSPITAL_COMMUNITY)
Admission: EM | Admit: 2020-11-20 | Discharge: 2020-11-21 | Disposition: A | Discharge status: Home / Self Care | Payer: Medicare Other | Attending: Internal Medicine | Admitting: Internal Medicine

## 2020-11-20 ENCOUNTER — Encounter (HOSPITAL_COMMUNITY): Payer: Self-pay | Admitting: Student

## 2020-11-20 DIAGNOSIS — Z79899 Other long term (current) drug therapy: Secondary | ICD-10-CM | POA: Insufficient documentation

## 2020-11-20 DIAGNOSIS — R9431 Abnormal electrocardiogram [ECG] [EKG]: Secondary | ICD-10-CM | POA: Diagnosis not present

## 2020-11-20 DIAGNOSIS — R0602 Shortness of breath: Secondary | ICD-10-CM | POA: Diagnosis not present

## 2020-11-20 DIAGNOSIS — Z66 Do not resuscitate: Secondary | ICD-10-CM | POA: Diagnosis present

## 2020-11-20 DIAGNOSIS — Z20822 Contact with and (suspected) exposure to covid-19: Secondary | ICD-10-CM | POA: Diagnosis not present

## 2020-11-20 DIAGNOSIS — R0689 Other abnormalities of breathing: Secondary | ICD-10-CM | POA: Diagnosis not present

## 2020-11-20 DIAGNOSIS — R062 Wheezing: Secondary | ICD-10-CM | POA: Diagnosis not present

## 2020-11-20 DIAGNOSIS — F209 Schizophrenia, unspecified: Secondary | ICD-10-CM | POA: Diagnosis present

## 2020-11-20 DIAGNOSIS — Z9981 Dependence on supplemental oxygen: Secondary | ICD-10-CM | POA: Insufficient documentation

## 2020-11-20 DIAGNOSIS — R0902 Hypoxemia: Secondary | ICD-10-CM | POA: Diagnosis not present

## 2020-11-20 DIAGNOSIS — R000 Tachycardia, unspecified: Secondary | ICD-10-CM | POA: Diagnosis not present

## 2020-11-20 DIAGNOSIS — I10 Essential (primary) hypertension: Secondary | ICD-10-CM | POA: Diagnosis not present

## 2020-11-20 DIAGNOSIS — J441 Chronic obstructive pulmonary disease with (acute) exacerbation: Secondary | ICD-10-CM | POA: Diagnosis not present

## 2020-11-20 DIAGNOSIS — J439 Emphysema, unspecified: Secondary | ICD-10-CM | POA: Diagnosis not present

## 2020-11-20 DIAGNOSIS — F17210 Nicotine dependence, cigarettes, uncomplicated: Secondary | ICD-10-CM | POA: Diagnosis not present

## 2020-11-20 DIAGNOSIS — I491 Atrial premature depolarization: Secondary | ICD-10-CM | POA: Diagnosis not present

## 2020-11-20 DIAGNOSIS — E785 Hyperlipidemia, unspecified: Secondary | ICD-10-CM | POA: Diagnosis present

## 2020-11-20 DIAGNOSIS — R079 Chest pain, unspecified: Secondary | ICD-10-CM | POA: Diagnosis not present

## 2020-11-20 LAB — CBC WITH DIFFERENTIAL/PLATELET
Abs Immature Granulocytes: 0.04 10*3/uL (ref 0.00–0.07)
Basophils Absolute: 0 10*3/uL (ref 0.0–0.1)
Basophils Relative: 1 %
Eosinophils Absolute: 0 10*3/uL (ref 0.0–0.5)
Eosinophils Relative: 0 %
HCT: 43.4 % (ref 39.0–52.0)
Hemoglobin: 14.5 g/dL (ref 13.0–17.0)
Immature Granulocytes: 1 %
Lymphocytes Relative: 10 %
Lymphs Abs: 0.7 10*3/uL (ref 0.7–4.0)
MCH: 30 pg (ref 26.0–34.0)
MCHC: 33.4 g/dL (ref 30.0–36.0)
MCV: 89.7 fL (ref 80.0–100.0)
Monocytes Absolute: 0.1 10*3/uL (ref 0.1–1.0)
Monocytes Relative: 1 %
Neutro Abs: 6.2 10*3/uL (ref 1.7–7.7)
Neutrophils Relative %: 87 %
Platelets: 237 10*3/uL (ref 150–400)
RBC: 4.84 MIL/uL (ref 4.22–5.81)
RDW: 13.2 % (ref 11.5–15.5)
WBC: 7.1 10*3/uL (ref 4.0–10.5)
nRBC: 0 % (ref 0.0–0.2)

## 2020-11-20 LAB — RESP PANEL BY RT-PCR (FLU A&B, COVID) ARPGX2
Influenza A by PCR: NEGATIVE
Influenza B by PCR: NEGATIVE
SARS Coronavirus 2 by RT PCR: NEGATIVE

## 2020-11-20 LAB — COMPREHENSIVE METABOLIC PANEL
ALT: 25 U/L (ref 0–44)
AST: 25 U/L (ref 15–41)
Albumin: 3.9 g/dL (ref 3.5–5.0)
Alkaline Phosphatase: 50 U/L (ref 38–126)
Anion gap: 9 (ref 5–15)
BUN: 5 mg/dL — ABNORMAL LOW (ref 8–23)
CO2: 23 mmol/L (ref 22–32)
Calcium: 8.9 mg/dL (ref 8.9–10.3)
Chloride: 101 mmol/L (ref 98–111)
Creatinine, Ser: 0.76 mg/dL (ref 0.61–1.24)
GFR, Estimated: >60 mL/min (ref >60)
Glucose, Bld: 148 mg/dL — ABNORMAL HIGH (ref 70–99)
Potassium: 4.6 mmol/L (ref 3.5–5.1)
Sodium: 133 mmol/L — ABNORMAL LOW (ref 135–145)
Total Bilirubin: 0.6 mg/dL (ref 0.3–1.2)
Total Protein: 6.9 g/dL (ref 6.5–8.1)

## 2020-11-20 LAB — TROPONIN I (HIGH SENSITIVITY): Troponin I (High Sensitivity): 5 ng/L (ref <18)

## 2020-11-20 MED ORDER — BENZONATATE 100 MG PO CAPS: 200.0000 mg | ORAL_CAPSULE | Freq: Two times a day (BID) | ORAL | Status: DC | PRN

## 2020-11-20 MED ORDER — UMECLIDINIUM BROMIDE 62.5 MCG/INH IN AEPB
1.0000 | INHALATION_SPRAY | Freq: Every day | RESPIRATORY_TRACT | Status: DC
  Administered 2020-11-21: 1 via RESPIRATORY_TRACT
  Filled 2020-11-20 (×2): qty 7

## 2020-11-20 MED ORDER — RISPERIDONE 1 MG PO TABS
3.0000 mg | ORAL_TABLET | Freq: Two times a day (BID) | ORAL | Status: DC
  Administered 2020-11-20 – 2020-11-21 (×3): 3 mg via ORAL
  Filled 2020-11-20 (×2): qty 1
  Filled 2020-11-20 (×2): qty 3

## 2020-11-20 MED ORDER — HYDROCODONE-ACETAMINOPHEN 5-325 MG PO TABS: 1.0000 | ORAL_TABLET | ORAL | Status: DC | PRN

## 2020-11-20 MED ORDER — SODIUM CHLORIDE 0.9 % IV SOLN
INTRAVENOUS | Status: DC | PRN
  Administered 2020-11-20: 500 mL via INTRAVENOUS

## 2020-11-20 MED ORDER — ONDANSETRON HCL 4 MG PO TABS: 4.0000 mg | ORAL_TABLET | Freq: Four times a day (QID) | ORAL | Status: DC | PRN

## 2020-11-20 MED ORDER — LORAZEPAM 2 MG/ML IJ SOLN: 1.0000 mg | INTRAMUSCULAR | Status: DC | PRN

## 2020-11-20 MED ORDER — LACTATED RINGERS IV SOLN: INTRAVENOUS | Status: AC

## 2020-11-20 MED ORDER — FAMOTIDINE 20 MG PO TABS
40.0000 mg | ORAL_TABLET | Freq: Two times a day (BID) | ORAL | Status: DC
  Administered 2020-11-20 – 2020-11-21 (×3): 40 mg via ORAL
  Filled 2020-11-20 (×3): qty 2

## 2020-11-20 MED ORDER — DOCUSATE SODIUM 100 MG PO CAPS
100.0000 mg | ORAL_CAPSULE | Freq: Two times a day (BID) | ORAL | Status: DC
  Administered 2020-11-20 – 2020-11-21 (×2): 100 mg via ORAL
  Filled 2020-11-20 (×3): qty 1

## 2020-11-20 MED ORDER — FLUTICASONE FUROATE-VILANTEROL 100-25 MCG/INH IN AEPB
1.0000 | INHALATION_SPRAY | Freq: Every day | RESPIRATORY_TRACT | Status: DC
  Administered 2020-11-21: 1 via RESPIRATORY_TRACT
  Filled 2020-11-20 (×2): qty 28

## 2020-11-20 MED ORDER — PREDNISONE 20 MG PO TABS
40.0000 mg | ORAL_TABLET | Freq: Every day | ORAL | Status: DC
  Administered 2020-11-21: 40 mg via ORAL
  Filled 2020-11-20: qty 2

## 2020-11-20 MED ORDER — METHYLPREDNISOLONE SODIUM SUCC 125 MG IJ SOLR
60.0000 mg | Freq: Two times a day (BID) | INTRAMUSCULAR | Status: AC
  Administered 2020-11-20 (×2): 60 mg via INTRAVENOUS
  Filled 2020-11-20 (×2): qty 2

## 2020-11-20 MED ORDER — TAMSULOSIN HCL 0.4 MG PO CAPS
0.4000 mg | ORAL_CAPSULE | Freq: Every day | ORAL | Status: DC
  Administered 2020-11-20: 0.4 mg via ORAL
  Filled 2020-11-20: qty 1

## 2020-11-20 MED ORDER — POLYETHYLENE GLYCOL 3350 17 G PO PACK: 17.0000 g | PACK | Freq: Every day | ORAL | Status: DC | PRN

## 2020-11-20 MED ORDER — SODIUM CHLORIDE 0.9% FLUSH: 3.0000 mL | Freq: Two times a day (BID) | INTRAVENOUS | Status: DC

## 2020-11-20 MED ORDER — MIRTAZAPINE 15 MG PO TABS
30.0000 mg | ORAL_TABLET | Freq: Every day | ORAL | Status: DC
  Administered 2020-11-20: 30 mg via ORAL
  Filled 2020-11-20: qty 2
  Filled 2020-11-20: qty 1

## 2020-11-20 MED ORDER — BUDESON-GLYCOPYRROL-FORMOTEROL 160-9-4.8 MCG/ACT IN AERO: 2.0000 | INHALATION_SPRAY | Freq: Two times a day (BID) | RESPIRATORY_TRACT | Status: DC

## 2020-11-20 MED ORDER — BENZTROPINE MESYLATE 2 MG PO TABS
2.0000 mg | ORAL_TABLET | Freq: Two times a day (BID) | ORAL | Status: DC
  Administered 2020-11-20 – 2020-11-21 (×2): 2 mg via ORAL
  Filled 2020-11-20 (×4): qty 1

## 2020-11-20 MED ORDER — BISACODYL 5 MG PO TBEC: 5.0000 mg | DELAYED_RELEASE_TABLET | Freq: Every day | ORAL | Status: DC | PRN

## 2020-11-20 MED ORDER — ALBUTEROL SULFATE (2.5 MG/3ML) 0.083% IN NEBU
2.5000 mg | INHALATION_SOLUTION | RESPIRATORY_TRACT | Status: DC | PRN
  Administered 2020-11-20 – 2020-11-21 (×5): 2.5 mg via RESPIRATORY_TRACT
  Filled 2020-11-20 (×6): qty 3

## 2020-11-20 MED ORDER — ATORVASTATIN CALCIUM 10 MG PO TABS
10.0000 mg | ORAL_TABLET | Freq: Every day | ORAL | Status: DC
  Administered 2020-11-20 – 2020-11-21 (×2): 10 mg via ORAL
  Filled 2020-11-20 (×2): qty 1

## 2020-11-20 MED ORDER — ENALAPRIL MALEATE 5 MG PO TABS
10.0000 mg | ORAL_TABLET | Freq: Every day | ORAL | Status: DC
  Administered 2020-11-20 – 2020-11-21 (×2): 10 mg via ORAL
  Filled 2020-11-20 (×2): qty 2

## 2020-11-20 MED ORDER — MAGNESIUM SULFATE 2 GM/50ML IV SOLN
2.0000 g | Freq: Once | INTRAVENOUS | Status: AC
  Administered 2020-11-20: 2 g via INTRAVENOUS
  Filled 2020-11-20: qty 50

## 2020-11-20 MED ORDER — ALBUTEROL (5 MG/ML) CONTINUOUS INHALATION SOLN
10.0000 mg/h | INHALATION_SOLUTION | Freq: Once | RESPIRATORY_TRACT | Status: AC
  Administered 2020-11-20: 10 mg/h via RESPIRATORY_TRACT
  Filled 2020-11-20: qty 20

## 2020-11-20 MED ORDER — SODIUM CHLORIDE 0.9 % IV BOLUS
500.0000 mL | Freq: Once | INTRAVENOUS | Status: AC
  Administered 2020-11-20: 500 mL via INTRAVENOUS

## 2020-11-20 MED ORDER — CARVEDILOL 3.125 MG PO TABS
3.1250 mg | ORAL_TABLET | Freq: Two times a day (BID) | ORAL | Status: DC
  Administered 2020-11-20 – 2020-11-21 (×3): 3.125 mg via ORAL
  Filled 2020-11-20 (×3): qty 1

## 2020-11-20 MED ORDER — MORPHINE SULFATE (PF) 2 MG/ML IV SOLN: 2.0000 mg | INTRAVENOUS | Status: DC | PRN

## 2020-11-20 MED ORDER — ACETAMINOPHEN 650 MG RE SUPP: 650.0000 mg | Freq: Four times a day (QID) | RECTAL | Status: DC | PRN

## 2020-11-20 MED ORDER — ENOXAPARIN SODIUM 40 MG/0.4ML IJ SOSY
40.0000 mg | PREFILLED_SYRINGE | INTRAMUSCULAR | Status: DC
  Administered 2020-11-20 – 2020-11-21 (×2): 40 mg via SUBCUTANEOUS
  Filled 2020-11-20 (×2): qty 0.4

## 2020-11-20 MED ORDER — SODIUM CHLORIDE 0.9 % IV SOLN
1.0000 g | Freq: Once | INTRAVENOUS | Status: AC
  Administered 2020-11-20: 1 g via INTRAVENOUS
  Filled 2020-11-20: qty 10

## 2020-11-20 MED ORDER — GUAIFENESIN ER 600 MG PO TB12
600.0000 mg | ORAL_TABLET | Freq: Two times a day (BID) | ORAL | Status: DC | PRN
  Administered 2020-11-21: 600 mg via ORAL
  Filled 2020-11-20: qty 1

## 2020-11-20 MED ORDER — IPRATROPIUM-ALBUTEROL 0.5-2.5 (3) MG/3ML IN SOLN
3.0000 mL | Freq: Four times a day (QID) | RESPIRATORY_TRACT | Status: DC
  Administered 2020-11-20 – 2020-11-21 (×4): 3 mL via RESPIRATORY_TRACT
  Filled 2020-11-20 (×6): qty 3

## 2020-11-20 MED ORDER — HYDRALAZINE HCL 20 MG/ML IJ SOLN: 5.0000 mg | INTRAMUSCULAR | Status: DC | PRN

## 2020-11-20 MED ORDER — ACETAMINOPHEN 325 MG PO TABS: 650.0000 mg | ORAL_TABLET | Freq: Four times a day (QID) | ORAL | Status: DC | PRN

## 2020-11-20 MED ORDER — SODIUM CHLORIDE 0.9 % IV SOLN
2.0000 g | Freq: Three times a day (TID) | INTRAVENOUS | Status: DC
  Administered 2020-11-21: 2 g via INTRAVENOUS
  Filled 2020-11-20 (×2): qty 2

## 2020-11-20 MED ORDER — ONDANSETRON HCL 4 MG/2ML IJ SOLN: 4.0000 mg | Freq: Four times a day (QID) | INTRAMUSCULAR | Status: DC | PRN

## 2020-11-20 NOTE — ED Triage Notes (Addendum)
Pt arrives to ED BIB [facility] due to [facility]. Per EMS pt has been feeling SHOB x5 days and upon EMS arrival O2 was 88% on Home Nebulizer Tx. EMS administered '125mg'$  Solumedrol and 2 DuoNebs. Hx of COPD and HTN. EPD at bedside upon pt's arrival.

## 2020-11-20 NOTE — Progress Notes (Signed)
Subsequent CAT started, patient clinical presentation and BBS to auscultation has improved.

## 2020-11-20 NOTE — H&P (Signed)
History and Physical    Blake Mcknight [id number] DOB: [birth year] DOA: 11/20/2020  PCP: [name], MD Consultants:  Adele Schilder - psychiatry Patient coming from:  Home - lives with sister; NOK: Sister, [phone]; [phone]  Chief Complaint: SOB  HPI: Blake Mcknight is a 65 y.o. male with medical history significant of COPD; HTN; HLD; and schizophrenia presenting with SOB, hypoxia to 88% with EMS.  He developed difficulty breathing.  His sister could tell yesterday AN - he increased neb treatments from q4 to q3h.  He started breathing harder, gasping for air.  He wears 2L home O2.  He started coughing more yesterday and moreso today, phlegm was yellowish maybe a little thicker than usual.  No fever.  He quit smoking maybe 2-3 months ago.    ED Course: Carryover, per Dr. Hal Hope:  65 year old male with known history of COPD presents with worsening shortness of breath.  Patient is found to be acutely wheezing despite nebulizer admitted for COPD exacerbation.  Review of Systems: As per HPI; otherwise review of systems reviewed and negative.   Ambulatory Status:  Ambulates without assistance  COVID Vaccine Status:   Complete plus booster  Past Medical History:  Diagnosis Date   COPD (chronic obstructive pulmonary disease) ([place])    with bullous emphysema.    Heavy cigarette smoker before 2003   pt claims only 10 cigs per day, never heavier amounts.    HLD (hyperlipidemia)    Hypertension    Schizophrenia ([place]) 06/28/2013   This is a chronic condition and he lives with family    Past Surgical History:  Procedure Laterality Date   CHOLECYSTECTOMY N/A 06/06/2013   Procedure: LAPAROSCOPIC CHOLECYSTECTOMY WITH ATTEMPTED INTRAOPERATIVE CHOLANGIOGRAM;  Surgeon: Zenovia Jarred, MD;  Location: [facility];  Service: General;  Laterality: N/A;   ERCP N/A 06/03/2013   Procedure: ENDOSCOPIC RETROGRADE CHOLANGIOPANCREATOGRAPHY (ERCP);  Surgeon: Ladene Artist, MD;   Location: [facility] ENDOSCOPY;  Service: Endoscopy;  Laterality: N/A;    Social History   Socioeconomic History   Marital status: Single    Spouse name: Not on file   Number of children: Not on file   Years of education: Not on file   Highest education level: Not on file  Occupational History   Occupation: disabled  Tobacco Use   Smoking status: Former    Packs/day: 1.00    Years: 41.00    Pack years: 41.00    Types: Cigarettes   Smokeless tobacco: Never   Tobacco comments:    Quit 2-3 months ago  Vaping Use   Vaping Use: Never used  Substance and Sexual Activity   Alcohol use: No   Drug use: No   Sexual activity: Yes  Other Topics Concern   Not on file  Social History Narrative   Not on file   Social Determinants of Health   Financial Resource Strain: Not on file  Food Insecurity: No Food Insecurity   Worried About Running Out of Food in the Last Year: Never true   [place] in the Last Year: Never true  Transportation Needs: No Transportation Needs   Lack of Transportation (Medical): No   Lack of Transportation (Non-Medical): No  Physical Activity: Not on file  Stress: Not on file  Social Connections: Not on file  Intimate Partner Violence: Not on file    No Known Allergies  Family History  Problem Relation Age of Onset   Diabetes Mellitus II Mother    Diabetes Mother  Heart disease Father    Stroke Maternal Aunt    CAD Neg Hx     Prior to Admission medications   Medication Sig Start Date End Date Taking? Authorizing Provider  albuterol (PROVENTIL) (2.5 MG/3ML) 0.083% nebulizer solution Take 2.5 mg by nebulization every 4 (four) hours.   Yes [provider]  albuterol (VENTOLIN HFA) 108 (90 Base) MCG/ACT inhaler Inhale 2 puffs into the lungs every 4 (four) hours as needed for wheezing or shortness of breath. 02/20/19  Yes Aline August, MD  Ascorbic Acid (VITAMIN C PO) Take 1 tablet by mouth 3 (three) times a week.    Yes [provider]  atorvastatin (LIPITOR) 10 MG tablet Take 10 mg by mouth daily. 09/21/15  Yes [provider]  benzonatate (TESSALON) 200 MG capsule Take 1 capsule (200 mg total) by mouth 2 (two) times daily as needed for cough. 09/05/20  Yes Mariel Aloe, MD  benztropine (COGENTIN) 2 MG tablet Take 1 tablet (2 mg total) by mouth 2 (two) times daily. 10/26/20  Yes Arfeen, Arlyce Harman, MD  BREZTRI AEROSPHERE 160-9-4.8 MCG/ACT AERO Inhale 2 puffs into the lungs in the morning and at bedtime. 07/18/20  Yes [provider]  carvedilol (COREG) 3.125 MG tablet Take 1 tablet (3.125 mg total) by mouth 2 (two) times daily with a meal. Patient taking differently: No sig reported 02/20/19  Yes Aline August, MD  cetirizine (ZYRTEC) 10 MG tablet Take 10 mg by mouth daily as needed for allergies or rhinitis.   Yes [provider]  cholecalciferol (VITAMIN D) 25 MCG (1000 UNIT) tablet Take 4,000 Units by mouth every evening.    Yes [provider]  Cyanocobalamin (B-12 PO) Take 1 tablet by mouth every other day.   Yes [provider]  enalapril (VASOTEC) 10 MG tablet Take 10 mg by mouth daily. 06/04/16  Yes [provider]  famotidine (PEPCID) 40 MG tablet Take 40 mg by mouth 2 (two) times daily. 12/28/18  Yes [provider]  guaiFENesin-dextromethorphan (ROBITUSSIN DM) 100-10 MG/5ML syrup Take 10 mLs by mouth every 4 (four) hours as needed for cough. 02/20/19  Yes Aline August, MD  HYDROCODONE-CHLORPHENIRAMINE PO Take 5 mLs by mouth every 6 (six) hours as needed (cough and pain).   Yes [provider]  ipratropium (ATROVENT) 0.02 % nebulizer solution Take 0.5 mg by nebulization every 4 (four) hours.   Yes [provider]  mirtazapine (REMERON) 30 MG tablet Take 1 tablet (30 mg total) by mouth at bedtime. 10/25/20 10/25/21 Yes Arfeen, Arlyce Harman, MD  multivitamin (ONE-A-DAY MEN'S) TABS tablet Take 1 tablet by mouth daily.   Yes [provider]  OXYGEN Inhale 2 L/min into the lungs continuous.    Yes [provider]  polyethylene glycol (MIRALAX / GLYCOLAX) 17 g packet Take 17 g by mouth daily as needed for mild constipation.   Yes [provider]  predniSONE (DELTASONE) 10 MG tablet Take 10 mg by mouth 2 (two) times daily. 10/30/20  Yes [provider]  risperiDONE (RISPERDAL) 3 MG tablet Take 1 tablet (3 mg total) by mouth 2 (two) times daily. 10/25/20  Yes Arfeen, Arlyce Harman, MD  sodium chloride (OCEAN) 0.65 % SOLN nasal spray Place 1 spray into both nostrils as needed for congestion.   Yes [provider]  tamsulosin (FLOMAX) 0.4 MG CAPS capsule Take 0.4 mg by mouth at bedtime. 07/23/20  Yes [provider]  ipratropium-albuterol (DUONEB) 0.5-2.5 (3) MG/3ML SOLN  Use 1 vial by nebulization every 4 (four) hours for 2 days, THEN use 1 vial every 4 (four) hours as needed (shortness of breath). Patient not taking: Reported on 11/20/2020 09/05/20 12/02/20  Mariel Aloe, MD  montelukast (SINGULAIR) 10 MG tablet Take 1 tablet (10 mg total) by mouth daily. Patient not taking: No sig reported 06/23/19 09/21/19  Harvie Heck, MD    Physical Exam: Vitals:   11/20/20 0535 11/20/20 0634 11/20/20 0637 11/20/20 0838  BP:    122/77  Pulse:    (!) 116  Resp:    18  Temp:      TempSrc:      SpO2: (!) 87%  96% 94%  Weight:  70.3 kg    Height:  '5\' 4"'$  (1.626 m)       General:  Appears calm and comfortable and is in NAD, pleasant  Eyes:  EOMI, normal lids, iris ENT:  grossly normal hearing, lips & tongue, mmm; mostly absent dentition Neck:  no LAD, masses or thyromegaly Cardiovascular:  RRR, no m/r/g. No LE edema.  Respiratory:   Scattered diffuse wheezes with poor air movement.  Normal respiratory effort on 2L [place] O2. Abdomen:  soft, NT, ND Skin:  no rash or induration seen on limited exam Musculoskeletal:  grossly normal tone BUE/BLE, good ROM, no bony abnormality Psychiatric:  blunted mood  and affect, speech sparse but appropriate, ?cognitive impairment Neurologic:  CN 2-12 grossly intact, moves all extremities in coordinated fashion    Radiological Exams on Admission: Independently reviewed - see discussion in A/P where applicable  DG Chest Portable 1 View  Result Date: 11/20/2020 CLINICAL DATA:  Chest pain EXAM: PORTABLE CHEST 1 VIEW COMPARISON:  09/02/2020 FINDINGS: Severe bullous emphysema and chronic changes throughout the lungs. Heart is normal size. No effusions. No acute airspace opacities. No acute bony abnormality. IMPRESSION: Severe bullous emphysema. No acute cardiopulmonary disease. Electronically Signed   By: Rolm Baptise M.D.   On: 11/20/2020 03:13    EKG: Independently reviewed.  Sinus tachycardia with rate 103; nonspecific ST changes with no evidence of acute ischemia   Labs on Admission: I have personally reviewed the available labs and imaging studies at the time of the admission.  Pertinent labs:   Na++ 133 Glucose 148 HS troponin 5 Normal CBC COVID/flu negative   Assessment/Plan Principal Problem:   COPD exacerbation (HCC) Active Problems:   HTN (hypertension)   HLD (hyperlipidemia)   Schizophrenia (HCC)   Do not resuscitate   Acute on chronic respiratory failure associated with a COPD exacerbation -Patient's shortness of breath and productive cough are most likely caused by acute COPD exacerbation.  -He has history of O2-dependent COPD and is currently on his home 2L [place] O2 but was hypoxic into the 80s on this at home with EMS -He does not have fever or leukocytosis.  -Chest x-ray is not consistent with pneumonia -He was given neb treatments in the ED with some improvement.   -will observe for now  -Nebulizers: scheduled Duoneb and prn albuterol -Solu-Medrol 60 mg IV BID -> Prednisone 40 mg PO daily; he normally takes prednisone 20 mg daily -IV Cefepime starting tomorrow (received Rocephin today in the ER) -Continue Breztri -Coordinated  care with TOC team/PT/OT/Nutrition/RT consults  HTN -Continue Coreg, Enalapril  HLD -Continue Lipitor  Schizophrenia -Continue home meds - Risperdal, Remeron, Cogentin     Note: This patient has been tested and is negative for the novel coronavirus COVID-19. He has been fully vaccinated against COVID-19.  His sister is interested I having him receive an additional booster, when appropriate (last was in May).    DVT prophylaxis: Lovenox  Code Status:  DNR - confirmed with family Family Communication: Sister was present throughout evaluation Disposition Plan:  The patient is from: home  Anticipated d/c is to: home without [facility] services  Anticipated d/c date will depend on clinical response to treatment, but possibly as early as tomorrow if he has excellent response to treatment  Patient is currently: acutely ill Consults called: TOC team/PT/OT/Nutrition/RT  Admission status: Admit - It is my clinical opinion that admission to INPATIENT is reasonable and necessary because this patient will require at least 2 midnights in the hospital to treat this condition based on the medical complexity of the problems presented.  Given the aforementioned information, the predictability of an adverse outcome is felt to be significant.    Karmen Bongo MD [facility]   How to contact the [facility] Attending or Consulting provider [place] or covering provider during after hours [place], for this patient?  Check the care team in [facility] and look for a) attending/consulting TR[facility] provider listed and b) the [facility] team listed Log into [email or web address] and use [place] universal password to access. If you do not have the password, please contact the hospital operator. Locate the [facility] provider you are looking for under [facility] and page to a number that you can be directly reached. If you still have difficulty reaching the provider, please page the [facility] (Director on Call) for the Hospitalists listed on  amion for assistance.   11/20/2020, 9:39 AM

## 2020-11-20 NOTE — Plan of Care (Signed)

## 2020-11-20 NOTE — ED Notes (Signed)
Attempted to call report to [phone] 

## 2020-11-20 NOTE — ED Provider Notes (Signed)
[facility] EMERGENCY DEPARTMENT Provider Note   CSN: [id number] Arrival date & time: 11/20/20  0241     History Chief Complaint  Patient presents with   Shortness of Breath    Blake Mcknight is a 65 y.o. male with a hx of COPD, chronic respiratory failure on 2L via [place], schizophrenia, hypertension, & hyperlipidemia who presents to the ED via EMS with complaints dyspnea x 5 days. Patient reports progressive shortness of breath, increasing use of neb txs at home with some relief, but sxs persist, tonight was not getting relief from his neb therefore called EMS. Per EMS sats 88% on home neb, gave duoneb x 2 and 125 mg of solumedrol en route with some improvement in air movement. Patient reports associated productive cough, wheezing, and chest tightness. Denies syncope, vomiting, hemoptysis, fever, chills, or leg pain/swelling. Reports requiring admission for his COPD, denies requiring intubation.   HPI     Past Medical History:  Diagnosis Date   COPD (chronic obstructive pulmonary disease) ([place])    with bullous emphysema.    Heavy cigarette smoker before 2003   pt claims only 10 cigs per day, never heavier amounts.    HLD (hyperlipidemia)    Hypertension    Schizophrenia ([place]) 06/28/2013   This is a chronic condition and he lives with family    Patient Active Problem List   Diagnosis Date Noted   COPD with acute exacerbation ([place]) 09/02/2020   Hypoxia    COPD exacerbation ([place]) 06/05/2019   Do not resuscitate 02/18/2019   Acute on chronic respiratory failure with hypoxia ([place]) 05/25/2016   Bullous emphysema ([place]) 07/26/2013   Schizophrenia ([place]) 06/28/2013   Tobacco use disorder 06/06/2013   Abdominal pain 06/03/2013   HTN (hypertension) 06/03/2013   HLD (hyperlipidemia) 06/03/2013   Chronic obstructive pulmonary disease with acute exacerbation ([place]) 06/03/2013   Calculus of bile duct without mention of cholecystitis or obstruction 06/03/2013    Past  Surgical History:  Procedure Laterality Date   CHOLECYSTECTOMY N/A 06/06/2013   Procedure: LAPAROSCOPIC CHOLECYSTECTOMY WITH ATTEMPTED INTRAOPERATIVE CHOLANGIOGRAM;  Surgeon: Zenovia Jarred, MD;  Location: [facility];  Service: General;  Laterality: N/A;   ERCP N/A 06/03/2013   Procedure: ENDOSCOPIC RETROGRADE CHOLANGIOPANCREATOGRAPHY (ERCP);  Surgeon: Ladene Artist, MD;  Location: [facility] ENDOSCOPY;  Service: Endoscopy;  Laterality: N/A;   NO PAST SURGERIES         Family History  Problem Relation Age of Onset   Diabetes Mellitus II Mother    Diabetes Mother    Heart disease Father    Stroke Maternal Aunt    CAD Neg Hx     Social History   Tobacco Use   Smoking status: Every Day    Packs/day: 1.00    Years: 41.00    Pack years: 41.00    Types: Cigarettes   Smokeless tobacco: Never   Tobacco comments:    Gets 4 puffs a day.   Vaping Use   Vaping Use: Never used  Substance Use Topics   Alcohol use: No   Drug use: No    Home Medications Prior to Admission medications   Medication Sig Start Date End Date Taking? Authorizing Provider  albuterol (PROVENTIL) (2.5 MG/3ML) 0.083% nebulizer solution Take 2.5 mg by nebulization every 4 (four) hours.   Yes [provider]  albuterol (VENTOLIN HFA) 108 (90 Base) MCG/ACT inhaler Inhale 2 puffs into the lungs every 4 (four) hours as needed for wheezing or shortness of breath.  02/20/19  Yes Aline August, MD  Ascorbic Acid (VITAMIN C PO) Take 1 tablet by mouth 3 (three) times a week.    Yes [provider]  atorvastatin (LIPITOR) 10 MG tablet Take 10 mg by mouth daily. 09/21/15  Yes [provider]  benzonatate (TESSALON) 200 MG capsule Take 1 capsule (200 mg total) by mouth 2 (two) times daily as needed for cough. 09/05/20  Yes Mariel Aloe, MD  benztropine (COGENTIN) 2 MG tablet Take 1 tablet (2 mg total) by mouth 2 (two) times daily. 10/26/20  Yes Arfeen, Arlyce Harman, MD  BREZTRI AEROSPHERE 160-9-4.8 MCG/ACT AERO  Inhale 2 puffs into the lungs in the morning and at bedtime. 07/18/20  Yes [provider]  carvedilol (COREG) 3.125 MG tablet Take 1 tablet (3.125 mg total) by mouth 2 (two) times daily with a meal. Patient taking differently: No sig reported 02/20/19  Yes Aline August, MD  cetirizine (ZYRTEC) 10 MG tablet Take 10 mg by mouth daily as needed for allergies or rhinitis.   Yes [provider]  cholecalciferol (VITAMIN D) 25 MCG (1000 UNIT) tablet Take 4,000 Units by mouth every evening.    Yes [provider]  Cyanocobalamin (B-12 PO) Take 1 tablet by mouth every other day.   Yes [provider]  enalapril (VASOTEC) 10 MG tablet Take 10 mg by mouth daily. 06/04/16  Yes [provider]  famotidine (PEPCID) 40 MG tablet Take 40 mg by mouth 2 (two) times daily. 12/28/18  Yes [provider]  guaiFENesin-dextromethorphan (ROBITUSSIN DM) 100-10 MG/5ML syrup Take 10 mLs by mouth every 4 (four) hours as needed for cough. 02/20/19  Yes Aline August, MD  HYDROCODONE-CHLORPHENIRAMINE PO Take 5 mLs by mouth every 6 (six) hours as needed (cough and pain).   Yes [provider]  ipratropium (ATROVENT) 0.02 % nebulizer solution Take 0.5 mg by nebulization every 4 (four) hours.   Yes [provider]  mirtazapine (REMERON) 30 MG tablet Take 1 tablet (30 mg total) by mouth at bedtime. 10/25/20 10/25/21 Yes Arfeen, Arlyce Harman, MD  multivitamin (ONE-A-DAY MEN'S) TABS tablet Take 1 tablet by mouth daily.   Yes [provider]  OXYGEN Inhale 2 L/min into the lungs continuous.    Yes [provider]  polyethylene glycol (MIRALAX / GLYCOLAX) 17 g packet Take 17 g by mouth daily as needed for mild constipation.   Yes [provider]  predniSONE (DELTASONE) 10 MG tablet Take 10 mg by mouth 2 (two) times daily. 10/30/20  Yes [provider]  risperiDONE (RISPERDAL) 3 MG tablet Take 1 tablet (3 mg total) by mouth 2 (two) times  daily. 10/25/20  Yes Arfeen, Arlyce Harman, MD  sodium chloride (OCEAN) 0.65 % SOLN nasal spray Place 1 spray into both nostrils as needed for congestion.   Yes [provider]  tamsulosin (FLOMAX) 0.4 MG CAPS capsule Take 0.4 mg by mouth at bedtime. 07/23/20  Yes [provider]  ipratropium-albuterol (DUONEB) 0.5-2.5 (3) MG/3ML SOLN Use 1 vial by nebulization every 4 (four) hours for 2 days, THEN use 1 vial every 4 (four) hours as needed (shortness of breath). Patient not taking: Reported on 11/20/2020 09/05/20 12/02/20  Mariel Aloe, MD  montelukast (SINGULAIR) 10 MG tablet Take 1 tablet (10 mg total) by mouth daily. Patient not taking: No sig reported 06/23/19 09/21/19  Harvie Heck, MD    Allergies    Patient has no known allergies.  Review of Systems   Review  of Systems  Constitutional:  Negative for chills and fever.  HENT:  Negative for ear pain.   Respiratory:  Positive for cough, chest tightness, shortness of breath and wheezing.   Cardiovascular:  Positive for chest pain.  Gastrointestinal:  Negative for abdominal pain.  Genitourinary:  Negative for dysuria.  Neurological:  Negative for syncope.  All other systems reviewed and are negative.  Physical Exam Updated Vital Signs BP 135/88   Pulse (!) 111   Temp 98 F (36.7 C) (Oral)   Resp 16   SpO2 (!) 87%   Physical Exam Vitals and nursing note reviewed.  Constitutional:      Appearance: He is ill-appearing (somewhat). He is not toxic-appearing.  HENT:     Head: Normocephalic and atraumatic.  Eyes:     General:        Right eye: No discharge.        Left eye: No discharge.     Conjunctiva/sclera: Conjunctivae normal.  Cardiovascular:     Rate and Rhythm: Regular rhythm. Tachycardia present.     Pulses:          Radial pulses are 2+ on the right side and 2+ on the left side.  Pulmonary:     Effort: Tachypnea present.     Breath sounds: Decreased air movement present. Wheezing (expiratory throughout)  present. No rhonchi or rales.  Abdominal:     General: There is no distension.     Palpations: Abdomen is soft.     Tenderness: There is no abdominal tenderness.  Musculoskeletal:     Cervical back: Neck supple.     Right lower leg: No tenderness. No edema.     Left lower leg: No tenderness. No edema.  Skin:    General: Skin is warm and dry.     Findings: No rash.  Neurological:     Mental Status: He is alert.     Comments: Clear speech.   Psychiatric:        Behavior: Behavior normal.    ED Results / Procedures / Treatments   Labs (all labs ordered are listed, but only abnormal results are displayed) Labs Reviewed  COMPREHENSIVE METABOLIC PANEL - Abnormal; Notable for the following components:      Result Value   Sodium 133 (*)    Glucose, Bld 148 (*)    BUN 5 (*)    All other components within normal limits  RESP PANEL BY RT-PCR (FLU A&B, COVID) ARPGX2  EXPECTORATED SPUTUM ASSESSMENT W GRAM STAIN, RFLX TO RESP C  CBC WITH DIFFERENTIAL/PLATELET  TROPONIN I (HIGH SENSITIVITY)  TROPONIN I (HIGH SENSITIVITY)    EKG None  Radiology DG Chest Portable 1 View  Result Date: 11/20/2020 CLINICAL DATA:  Chest pain EXAM: PORTABLE CHEST 1 VIEW COMPARISON:  09/02/2020 FINDINGS: Severe bullous emphysema and chronic changes throughout the lungs. Heart is normal size. No effusions. No acute airspace opacities. No acute bony abnormality. IMPRESSION: Severe bullous emphysema. No acute cardiopulmonary disease. Electronically Signed   By: Rolm Baptise M.D.   On: 11/20/2020 03:13    Procedures .Critical Care  Date/Time: 11/20/2020 6:16 AM Performed by: Amaryllis Dyke, PA-C Authorized by: Amaryllis Dyke, PA-C     CRITICAL CARE Performed by: Kennith Maes   Total critical care time: 30 minutes  Critical care time was exclusive of separately billable procedures and treating other patients.  Critical care was necessary to treat or prevent imminent or  life-threatening deterioration.  Critical care was time spent  personally by me on the following activities: development of treatment plan with patient and/or surrogate as well as nursing, discussions with consultants, evaluation of patient's response to treatment, examination of patient, obtaining history from patient or surrogate, ordering and performing treatments and interventions, ordering and review of laboratory studies, ordering and review of radiographic studies, pulse oximetry and re-evaluation of patient's condition.   Medications Ordered in ED Medications  albuterol (PROVENTIL,VENTOLIN) solution continuous neb (has no administration in time range)  magnesium sulfate IVPB 2 g 50 mL (has no administration in time range)  cefTRIAXone (ROCEPHIN) 1 g in sodium chloride 0.9 % 100 mL IVPB (has no administration in time range)  albuterol (PROVENTIL,VENTOLIN) solution continuous neb (10 mg/hr Nebulization Given 11/20/20 0308)  sodium chloride 0.9 % bolus 500 mL (0 mLs Intravenous Stopped 11/20/20 0449)    ED Course  I have reviewed the triage vital signs and the nursing notes.  Pertinent labs & imaging results that were available during my care of the patient were reviewed by me and considered in my medical decision making (see chart for details).    MDM Rules/Calculators/A&P                           Patient presents to the ED with complaints of dyspnea.  Mildly ill appearing, tachypneic, otherwise vitals unremarkable on supplemental O2. Expiratory wheezing with tachypnea and poor air movement on exam. Solumedrol & duoneb x 2 given en route. Will start continuous albuterol neb with plan for labs, CXR, EKG, and reassessment.   Additional history obtained:  Additional history obtained from chart review & nursing note review.   EKG: No STEMI.   Imaging Studies ordered:  I ordered imaging studies which included CXR, I independently reviewed, formal radiology impression shows:  Severe  bullous emphysema. No acute cardiopulmonary disease  05:05: RE-EVAL: Completing continuous neb tx, remains with poor air movement & expiratory wheeze, will plan to re-assess.   Lab Tests:  I Ordered, reviewed, and interpreted labs, which included:  CBC: Unremarkable CMP: Hyperglycemia without acidosis or anion gap elevation Troponin: Negative Respiratory panel: Negative  05:55: Patient remains with poor air movement and end expiratory wheeze, states his breathing still does not feel right, SPO2 currently 90% on 2 L via nasal cannula which is his baseline oxygen requirement.  Will give second continuous albuterol nebulizer treatment, magnesium, antibiotics for COPD exacerbation, and discuss with medicine for admission.  06:15: CONSULT: Discussed with hospitalist Dr. Hal Hope- accepts admission.   Findings and plan of care discussed with supervising physician Dr. Dayna Barker who is in agreement.   Portions of this note were generated with Lobby[employer]. Dictation errors may occur despite best attempts at proofreading.  Final Clinical Impression(s) / ED Diagnoses Final diagnoses:  COPD exacerbation [facility])    Rx / DC Orders ED Discharge Orders     None        Amaryllis Dyke, PA-C 11/20/20 0617    Mesner, Corene Cornea, MD 11/20/20 [phone]

## 2020-11-20 NOTE — ED Notes (Signed)
Attempted to call report x2

## 2020-11-20 NOTE — Progress Notes (Signed)
$'10mg'b$  CAT started, on arrival noted prolonged exhalation, non-labored respirations w/ symmetrical chest rise accompanied w/ audible wheezing at the door. BBS to auscultation reveals decrease air entry with diffuse expiratory wheezing. Patient is speaking to me in unbroken sentences. Patient states that he has no known sick contact. States he is tired. RRT will monitor patient as needed.  Audrionna Lampton L. Tamala Julian, BS, RRT-ACCS, RCP

## 2020-11-21 DIAGNOSIS — J441 Chronic obstructive pulmonary disease with (acute) exacerbation: Secondary | ICD-10-CM | POA: Diagnosis not present

## 2020-11-21 DIAGNOSIS — Z66 Do not resuscitate: Secondary | ICD-10-CM

## 2020-11-21 DIAGNOSIS — E785 Hyperlipidemia, unspecified: Secondary | ICD-10-CM | POA: Diagnosis not present

## 2020-11-21 DIAGNOSIS — I10 Essential (primary) hypertension: Secondary | ICD-10-CM

## 2020-11-21 DIAGNOSIS — F203 Undifferentiated schizophrenia: Secondary | ICD-10-CM | POA: Diagnosis not present

## 2020-11-21 LAB — BASIC METABOLIC PANEL
Anion gap: 8 (ref 5–15)
BUN: 6 mg/dL — ABNORMAL LOW (ref 8–23)
CO2: 26 mmol/L (ref 22–32)
Calcium: 9 mg/dL (ref 8.9–10.3)
Chloride: 98 mmol/L (ref 98–111)
Creatinine, Ser: 0.73 mg/dL (ref 0.61–1.24)
GFR, Estimated: >60 mL/min (ref >60)
Glucose, Bld: 134 mg/dL — ABNORMAL HIGH (ref 70–99)
Potassium: 4.4 mmol/L (ref 3.5–5.1)
Sodium: 132 mmol/L — ABNORMAL LOW (ref 135–145)

## 2020-11-21 LAB — CBC
HCT: 40.3 % (ref 39.0–52.0)
Hemoglobin: 13.4 g/dL (ref 13.0–17.0)
MCH: 29.6 pg (ref 26.0–34.0)
MCHC: 33.3 g/dL (ref 30.0–36.0)
MCV: 89 fL (ref 80.0–100.0)
Platelets: 256 10*3/uL (ref 150–400)
RBC: 4.53 MIL/uL (ref 4.22–5.81)
RDW: 13.4 % (ref 11.5–15.5)
WBC: 10.7 10*3/uL — ABNORMAL HIGH (ref 4.0–10.5)
nRBC: 0 % (ref 0.0–0.2)

## 2020-11-21 MED ORDER — PREDNISONE 10 MG PO TABS: ORAL_TABLET | ORAL | 0 refills | Status: DC

## 2020-11-21 MED ORDER — DOXYCYCLINE HYCLATE 100 MG PO TABS: 100.0000 mg | ORAL_TABLET | Freq: Two times a day (BID) | ORAL | 0 refills | Status: AC

## 2020-11-21 NOTE — TOC Transition Note (Signed)
Transition of Care [facility]) - CM/SW Discharge Note   Patient Details  Name: Blake Mcknight MRN: [id number] Date of Birth: April 10, 1955  Transition of Care [facility]) CM/SW Contact:  Zenon Mayo, RN Phone Number: 11/21/2020, 10:16 AM   Clinical Narrative:    NCM spoke with patient at bedside, he lives with his sister, he states he will have transport at dc.  He has no issues getting his medications.  He states he does not need any DME or [place] services, he is indep and ambulatory.     Final next level of care: Home/Self Care Barriers to Discharge: No Barriers Identified   Patient Goals and CMS Choice Patient states their goals for this hospitalization and ongoing recovery are:: return home   Choice offered to / list presented to : NA  Discharge Placement                       Discharge Plan and Services                  DME Agency: NA       HH Arranged: NA          Social Determinants of Health (SDOH) Interventions     Readmission Risk Interventions Readmission Risk Prevention Plan 06/22/2019 06/07/2019  Transportation Screening Complete Complete  PCP or Specialist Appt within 3-5 Days Complete Complete  HRI or [place] Complete Complete  Social Work Consult for [place] Planning/Counseling Complete -  Palliative Care Screening Complete Not Applicable  Medication Review [employer]) Complete Referral to Pharmacy  Some recent data might be hidden

## 2020-11-21 NOTE — Consult Note (Signed)
   [facility] CM Inpatient Consult   11/21/2020  Blake Mcknight 09/19/1955 [id number]  [facility] [ACO] Patient: Medicare CMS DCE   Patient is currently active with [facility] for chronic disease management services.  Patient has been engaged by a [facility] Management Coordinator.  Our community based plan of care has focused on disease management and community resource support.    Patient will receive a post hospital call and will be evaluated for assessments and disease process education.  Came by the room put up on side of bed with Respiratory Therapy giving a neb treatment. Noted to transition home today.  Plan:  Made Inpatient Transition Of Care [TOC] team member aware that [facility] Management following.   Of note, [facility] Care Management services does not replace or interfere with any services that are needed or arranged by inpatient [facility] care management team.  For additional questions or referrals please contact:   Natividad Brood, RN BSN [facility] Liaison  [phone] business mobile phone Toll free office [phone]  Fax number: [phone] [email or web address]

## 2020-11-21 NOTE — Care Management Obs Status (Signed)
MEDICARE OBSERVATION STATUS NOTIFICATION   Patient Details  Name: Blake Mcknight MRN: [id number] Date of Birth: 12-30-1955   Medicare Observation Status Notification Given:  Yes    Zenon Mayo, RN 11/21/2020, 10:15 AM

## 2020-11-21 NOTE — Discharge Summary (Signed)
Physician Discharge Summary  Blake Mcknight [id number] DOB: 02-06-1956 DOA: 11/20/2020  PCP: [name], MD  Admit date: 11/20/2020 Discharge date: 11/21/2020  Admitted From: Home  Discharge disposition: Home  Recommendations for Outpatient Follow-Up:   Follow up with your primary care provider in one week.  Check CBC, BMP, magnesium in the next visit  Discharge Diagnosis:   Principal Problem:   COPD exacerbation ([place]) Active Problems:   HTN (hypertension)   HLD (hyperlipidemia)   Schizophrenia ([place])   Do not resuscitate  Discharge Condition: Improved.  Diet recommendation: Low sodium, heart healthy.    Wound care: None.  Code status: Full code.  History of Present Illness:   Blake Mcknight is a 65 y.o. male with medical history significant of COPD, essential hypertension, hyperlipidemia and schizophrenia presented to hospital with shortness of breath.  EMS noted him to be hypoxic with pulse ox of 88% on room air.;  Does have chronic hypoxic respiratory failure and wears 2 L of oxygen by nasal cannula at home.  He was however noted to be coughing more with yellowish phlegm.  In the ED, patient was noted to be actively wheezing despite nebulizer and was placed in observation for COPD exacerbation.  Hospital Course:   Following conditions were addressed during hospitalization as listed below,  Acute on chronic respiratory failure associated with  COPD exacerbation  Patient is oxygen dependent at home at 2 L/min.  Currently, at baseline.  No fever or leukocytosis chest x-ray without any infiltrate.  Patient was given Solu-Medrol in the ED will transition to prednisone taper on discharge and add doxycycline.  At this time patient is comfortable and at his baseline.  He is actually not wearing any oxygen at this time.  Essential HTN -Continue Coreg, Enalapril   Hyperlipidemia -Continue Lipitor   Schizophrenia -Continue home meds - Risperdal, Remeron,  Cogentin   Disposition.  At this time, patient is stable for disposition home with outpatient PCP follow-up  Medical Consultants:   None.  Procedures:    None Subjective:   Today, patient having episode.  Feels much better today.  Has mild cough but no shortness of breath or dyspnea.  Off oxygen.  Discharge Exam:   Vitals:   11/21/20 0700 11/21/20 0754  BP: 130/76   Pulse: 89 (!) 116  Resp: 14 20  Temp: 98 F (36.7 C)   SpO2: 97%    Vitals:   11/21/20 0021 11/21/20 0331 11/21/20 0700 11/21/20 0754  BP: (!) 143/84 139/87 130/76   Pulse: 88 (!) 115 89 (!) 116  Resp: '19 19 14 20  '$ Temp: 97.8 F (36.6 C) 98 F (36.7 C) 98 F (36.7 C)   TempSrc: Oral Oral    SpO2: 99% 94% 97%   Weight:  69.4 kg    Height:       General: Alert awake, not in obvious distress HENT: pupils equally reacting to light,  No scleral pallor or icterus noted. Oral mucosa is moist.  Chest: Diminished breath sounds bilaterally, no wheezes . CVS: S1 &S2 heard. No murmur.  Regular rate and rhythm. Abdomen: Soft, nontender, nondistended.  Bowel sounds are heard.   Extremities: No cyanosis, clubbing or edema.  Peripheral pulses are palpable. Psych: Alert, awake and oriented, blunted affect. CNS:  No cranial nerve deficits.  Power equal in all extremities.   Skin: Warm and dry.  No rashes noted.  The results of significant diagnostics from this hospitalization (including imaging, microbiology, ancillary and laboratory) are  listed below for reference.     Diagnostic Studies:   DG Chest Portable 1 View  Result Date: 11/20/2020 CLINICAL DATA:  Chest pain EXAM: PORTABLE CHEST 1 VIEW COMPARISON:  09/02/2020 FINDINGS: Severe bullous emphysema and chronic changes throughout the lungs. Heart is normal size. No effusions. No acute airspace opacities. No acute bony abnormality. IMPRESSION: Severe bullous emphysema. No acute cardiopulmonary disease. Electronically Signed   By: Rolm Baptise M.D.   On: 11/20/2020  03:13     Labs:   Basic Metabolic Panel: Recent Labs  Lab 11/20/20 0458 11/21/20 0125  NA 133* 132*  K 4.6 4.4  CL 101 98  CO2 23 26  GLUCOSE 148* 134*  BUN 5* 6*  CREATININE 0.76 0.73  CALCIUM 8.9 9.0   GFR Estimated Creatinine Clearance: 86.4 mL/min (by C-G formula based on SCr of 0.73 mg/dL). Liver Function Tests: Recent Labs  Lab 11/20/20 0458  AST 25  ALT 25  ALKPHOS 50  BILITOT 0.6  PROT 6.9  ALBUMIN 3.9   No results for input(s): LIPASE, AMYLASE in the last 168 hours. No results for input(s): AMMONIA in the last 168 hours. Coagulation profile No results for input(s): INR, PROTIME in the last 168 hours.  CBC: Recent Labs  Lab 11/20/20 0458 11/21/20 0125  WBC 7.1 10.7*  NEUTROABS 6.2  --   HGB 14.5 13.4  HCT 43.4 40.3  MCV 89.7 89.0  PLT 237 256   Cardiac Enzymes: No results for input(s): CKTOTAL, CKMB, CKMBINDEX, TROPONINI in the last 168 hours. BNP: Invalid input(s): POCBNP CBG: No results for input(s): GLUCAP in the last 168 hours. D-Dimer No results for input(s): DDIMER in the last 72 hours. Hgb A1c No results for input(s): HGBA1C in the last 72 hours. Lipid Profile No results for input(s): CHOL, HDL, LDLCALC, TRIG, CHOLHDL, LDLDIRECT in the last 72 hours. Thyroid function studies No results for input(s): TSH, T4TOTAL, T3FREE, THYROIDAB in the last 72 hours.  Invalid input(s): FREET3 Anemia work up No results for input(s): VITAMINB12, FOLATE, FERRITIN, TIBC, IRON, RETICCTPCT in the last 72 hours. Microbiology Recent Results (from the past 240 hour(s))  Resp Panel by RT-PCR (Flu A&B, Covid) Nasopharyngeal Swab     Status: None   Collection Time: 11/20/20  4:36 AM   Specimen: Nasopharyngeal Swab; Nasopharyngeal(NP) swabs in vial transport medium  Result Value Ref Range Status   SARS Coronavirus 2 by RT PCR NEGATIVE NEGATIVE Final    Comment: (NOTE) SARS-CoV-2 target nucleic acids are NOT DETECTED.  The SARS-CoV-2 RNA is generally  detectable in upper respiratory specimens during the acute phase of infection. The lowest concentration of SARS-CoV-2 viral copies this assay can detect is 138 copies/mL. A negative result does not preclude SARS-Cov-2 infection and should not be used as the sole basis for treatment or other patient management decisions. A negative result may occur with  improper specimen collection/handling, submission of specimen other than nasopharyngeal swab, presence of viral mutation(s) within the areas targeted by this assay, and inadequate number of viral copies(<138 copies/mL). A negative result must be combined with clinical observations, patient history, and epidemiological information. The expected result is Negative.  Fact Sheet for Patients:  EntrepreneurPulse.com.au  Fact Sheet for Healthcare Providers:  IncredibleEmployment.be  This test is no t yet approved or cleared by the [place] FDA and  has been authorized for detection and/or diagnosis of SARS-CoV-2 by FDA under an Emergency Use Authorization (EUA). This EUA will remain  in effect (meaning this test can be  used) for the duration of the COVID-19 declaration under Section 564(b)(1) of the Act, 21 U.S.C.section 360bbb-3(b)(1), unless the authorization is terminated  or revoked sooner.       Influenza A by PCR NEGATIVE NEGATIVE Final   Influenza B by PCR NEGATIVE NEGATIVE Final    Comment: (NOTE) The Xpert Xpress SARS-CoV-2/FLU/RSV plus assay is intended as an aid in the diagnosis of influenza from Nasopharyngeal swab specimens and should not be used as a sole basis for treatment. Nasal washings and aspirates are unacceptable for Xpert Xpress SARS-CoV-2/FLU/RSV testing.  Fact Sheet for Patients: EntrepreneurPulse.com.au  Fact Sheet for Healthcare Providers: IncredibleEmployment.be  This test is not yet approved or cleared by the [place] FDA  and has been authorized for detection and/or diagnosis of SARS-CoV-2 by FDA under an Emergency Use Authorization (EUA). This EUA will remain in effect (meaning this test can be used) for the duration of the COVID-19 declaration under Section 564(b)(1) of the Act, 21 U.S.C. section 360bbb-3(b)(1), unless the authorization is terminated or revoked.  Performed at [facility], [place] [address], [place], [place] [address]      Discharge Instructions:   Discharge Instructions     Diet - low sodium heart healthy   Complete by: As directed    Discharge instructions   Complete by: As directed    Follow up with your primary care provider in one week.  Use oxygen and breathing treatment at home. No overexertion. Complete the course of antibiotics and prednisone.   Increase activity slowly   Complete by: As directed       Allergies as of 11/21/2020   No Known Allergies      Medication List     TAKE these medications    albuterol (2.5 MG/3ML) 0.083% nebulizer solution Commonly known as: PROVENTIL Take 2.5 mg by nebulization every 4 (four) hours.   albuterol 108 (90 Base) MCG/ACT inhaler Commonly known as: VENTOLIN HFA Inhale 2 puffs into the lungs every 4 (four) hours as needed for wheezing or shortness of breath.   atorvastatin 10 MG tablet Commonly known as: LIPITOR Take 10 mg by mouth daily.   B-12 PO Take 1 tablet by mouth every other day.   benzonatate 200 MG capsule Commonly known as: TESSALON Take 1 capsule (200 mg total) by mouth 2 (two) times daily as needed for cough.   benztropine 2 MG tablet Commonly known as: COGENTIN Take 1 tablet (2 mg total) by mouth 2 (two) times daily.   Breztri Aerosphere 160-9-4.8 MCG/ACT Aero Generic drug: Budeson-Glycopyrrol-Formoterol Inhale 2 puffs into the lungs in the morning and at bedtime.   carvedilol 3.125 MG tablet Commonly known as: COREG Take 1 tablet (3.125 mg total) by mouth 2 (two) times daily with a  meal. What changed:  how much to take when to take this additional instructions   cetirizine 10 MG tablet Commonly known as: ZYRTEC Take 10 mg by mouth daily as needed for allergies or rhinitis.   cholecalciferol 25 MCG (1000 UNIT) tablet Commonly known as: VITAMIN D Take 4,000 Units by mouth every evening.   doxycycline 100 MG tablet Commonly known as: VIBRA-TABS Take 1 tablet (100 mg total) by mouth 2 (two) times daily for 5 days.   enalapril 10 MG tablet Commonly known as: VASOTEC Take 10 mg by mouth daily.   famotidine 40 MG tablet Commonly known as: PEPCID Take 40 mg by mouth 2 (two) times daily.   guaiFENesin-dextromethorphan 100-10 MG/5ML syrup Commonly known as:  ROBITUSSIN DM Take 10 mLs by mouth every 4 (four) hours as needed for cough.   HYDROCODONE-CHLORPHENIRAMINE PO Take 5 mLs by mouth every 6 (six) hours as needed (cough and pain).   ipratropium 0.02 % nebulizer solution Commonly known as: ATROVENT Take 0.5 mg by nebulization every 4 (four) hours.   mirtazapine 30 MG tablet Commonly known as: REMERON Take 1 tablet (30 mg total) by mouth at bedtime.   montelukast 10 MG tablet Commonly known as: SINGULAIR Take 1 tablet (10 mg total) by mouth daily.   multivitamin Tabs tablet Take 1 tablet by mouth daily.   OXYGEN Inhale 2 L/min into the lungs continuous.   polyethylene glycol 17 g packet Commonly known as: MIRALAX / GLYCOLAX Take 17 g by mouth daily as needed for mild constipation.   predniSONE 10 MG tablet Commonly known as: DELTASONE Take 4 tablets (40 mg) daily for 2 days, then, Take 3 tablets (30 mg) daily for 2 days, then, Take 2 tablets (20 mg) daily for 2 days, then, Take 1 tablets (10 mg) daily for 1 days, then stop What changed:  how much to take how to take this when to take this additional instructions   risperiDONE 3 MG tablet Commonly known as: RISPERDAL Take 1 tablet (3 mg total) by mouth 2 (two) times daily.   sodium  chloride 0.65 % Soln nasal spray Commonly known as: OCEAN Place 1 spray into both nostrils as needed for congestion.   tamsulosin 0.4 MG Caps capsule Commonly known as: FLOMAX Take 0.4 mg by mouth at bedtime.   VITAMIN C PO Take 1 tablet by mouth 3 (three) times a week.        Follow-up Information     [name], MD. Schedule an appointment as soon as possible for a visit in 1 week(s).   Specialty: Pulmonary Disease Contact information: [place] [address] [phone]                 Time coordinating discharge: 39 minutes  Signed:  Fredrica Capano  [facility] 11/21/2020, 9:03 AM

## 2020-11-21 NOTE — Evaluation (Addendum)
Occupational Therapy Evaluation Patient Details Name: Blake Mcknight MRN: [id number] DOB: 11/30/1955 Today's Date: 11/21/2020    History of Present Illness Pt is a 65 y.o. male who presented 11/20/20 with SOB, hypoxia to 88% with EMS. Pt admitted for COPD exacerbation. Chest x-ray revealed severe bullous emphysema, no acute cardiopulmonary disease. PMH: COPD, HLD, HTN, schizophrenia   Clinical Impression   Blake Mcknight is a 65 year old man admitted to hospital with COPD exacerbation. Patient reports he lives with his sister and she assists him with stairs and ADLs. Patient demonstrates ability to perform ADLs and in room ambulation without DME but needs rest break due to shortness of breath. Patient found on room air with sat at 94%. Patient's oxygen decreased to 85% with approx 20 feet of ambulation but recovered to low 90s. Patient has home oxygen. Patient's communication limited and appears to have some baseline cognitive impairments. Patient appears to be at his baseline and has no OT needs.   Follow Up Recommendations  No OT follow up    Equipment Recommendations  None recommended by OT    Recommendations for Other Services       Precautions / Restrictions Precautions Precautions: Fall Precaution Comments: monitor SpO2 and HR Restrictions Weight Bearing Restrictions: No      Mobility Bed Mobility Overal bed mobility: Modified Independent             General bed mobility comments: Pt able to transition supine > sit EOB with HOB slightly elevated in appropriate time frame without assistance.    Transfers Overall transfer level: Needs assistance Equipment used: None Transfers: Sit to/from Stand Sit to Stand: Min guard         General transfer comment: Patient ambulated in room without RW without overt loss of balance. Sat 85% on RA after approx 20 feet of ambulation.    Balance Overall balance assessment: Mild deficits observed, not formally  tested Sitting-balance support: No upper extremity supported;Feet supported Sitting balance-Leahy Scale: Fair Sitting balance - Comments: Static sitting EOB with supervision for safety. Dyspnea noted with coming to sit from supine and while sitting EOB but SpO2 >/= 96% on RA.   Standing balance support: Bilateral upper extremity supported;During functional activity Standing balance-Leahy Scale: Poor Standing balance comment: Reliant on UE support for stability.                           ADL either performed or assessed with clinical judgement   ADL Overall ADL's : At baseline                                       General ADL Comments: Reports he has assistance with ADLs from his sister. He demonstrates the ability to use urinal independently and don socks. Demonstrates the physiacl abilities to perform ADLs with rest breaks. Probably needs supervision at baseline due to potential cognitive impairments.     Vision   Vision Assessment?: No apparent visual deficits     Perception     Praxis      Pertinent Vitals/Pain Pain Assessment: No/denies pain     Hand Dominance Right   Extremity/Trunk Assessment Upper Extremity Assessment Upper Extremity Assessment: Overall WFL for tasks assessed   Lower Extremity Assessment Lower Extremity Assessment: Defer to PT evaluation   Cervical / Trunk Assessment Cervical / Trunk Assessment: Normal  Communication Communication Communication: No difficulties   Cognition Arousal/Alertness: Awake/alert Behavior During Therapy: WFL for tasks assessed/performed Overall Cognitive Status: Difficult to assess                                 General Comments: Patient answers questions with 1-2 words with minimal detail. Does report conflicting information. Appears to be at his baseline.   General Comments  Supine BP 139/83, SpO2 >/= 98% on RA, HR 100-110s; sitting BP 147/94, SpO2 >/= 96% on RA but  dyspnea noted, HR 110-120; standing BP 163/141 (tremulous UEs noted), SpO2 >/= 98% on 2L O2 dyspnea noted, HR 120s; BP sitting after gait 147/96, HR 110s, SpO2 >/= 96% on RA    Exercises     Shoulder Instructions      Home Living Family/patient expects to be discharged to:: Private residence Living Arrangements: Other relatives (sister) Available Help at Discharge: Family;Available 24 hours/day Type of Home: House Home Access: Stairs to enter [employer] of Steps: 2 Entrance Stairs-Rails: None Home Layout: One level     Bathroom Shower/Tub: Teacher, early years/pre: Standard     Home Equipment: Clinical cytogeneticist - 4 wheels;Wheelchair - manual          Prior Functioning/Environment Level of Independence: Needs assistance  Gait / Transfers Assistance Needed: Reports sister helps with stair management. Sister helps with ambulation as needed as well. ADL's / Homemaking Assistance Needed: Sister assists with bathing and dressing. She also helps with shower transfers.   Comments: Pt reports being on 2L O2 at home        OT Problem List: Cardiopulmonary status limiting activity      OT Treatment/Interventions:      OT Goals(Current goals can be found in the Mcknight plan section) Acute Rehab OT Goals Patient Stated Goal: to go home with sister OT Goal Formulation: All assessment and education complete, DC therapy  OT Frequency:     Barriers to D/C:            Co-evaluation              AM-PAC OT "6 Clicks" Daily Activity     Outcome Measure Help from another person eating meals?: None Help from another person taking Mcknight of personal grooming?: None Help from another person toileting, which includes using toliet, bedpan, or urinal?: None Help from another person bathing (including washing, rinsing, drying)?: None Help from another person to put on and taking off regular upper body clothing?: None Help from another person to put on and taking  off regular lower body clothing?: None 6 Click Score: 24   End of Session Nurse Communication: Mobility status  Activity Tolerance: Patient tolerated treatment well Patient left: in bed;with call bell/phone within reach;with bed alarm set  OT Visit Diagnosis: Muscle weakness (generalized) (M62.81)                Time: [id number] OT Time Calculation (min): 10 min Charges:  OT General Charges $OT Visit: 1 Visit OT Evaluation $OT Eval Low Complexity: 1 Low  Bexton Haak, OTR/L [place]  Office [phone] Pager: [place] 11/21/2020, 11:57 AM

## 2020-11-21 NOTE — Progress Notes (Signed)
Nutrition Brief Note  RD consulted to assess pt as part of COPD exacerbation protocol.   Admitting Dx: COPD exacerbation ([place]) [J44.1] Past Medical History:  Diagnosis Date   COPD (chronic obstructive pulmonary disease) ([place])    with bullous emphysema.    Heavy cigarette smoker before 2003   pt claims only 10 cigs per day, never heavier amounts.    HLD (hyperlipidemia)    Hypertension    Schizophrenia ([place]) 06/28/2013   This is a chronic condition and he lives with family   Medications:   atorvastatin  10 mg Oral Daily   benztropine  2 mg Oral BID   carvedilol  3.125 mg Oral BID WC   docusate sodium  100 mg Oral BID   enalapril  10 mg Oral Daily   enoxaparin (LOVENOX) injection  40 mg Subcutaneous Q24H   famotidine  40 mg Oral BID   fluticasone furoate-vilanterol  1 puff Inhalation Daily   And   umeclidinium bromide  1 puff Inhalation Daily   ipratropium-albuterol  3 mL Nebulization Q6H   mirtazapine  30 mg Oral QHS   predniSONE  40 mg Oral Q breakfast   risperiDONE  3 mg Oral BID   tamsulosin  0.4 mg Oral QHS   Labs: Recent Labs  Lab 11/20/20 0458 11/21/20 0125  NA 133* 132*  K 4.6 4.4  CL 101 98  CO2 23 26  BUN 5* 6*  CREATININE 0.76 0.73  CALCIUM 8.9 9.0  GLUCOSE 148* 134*   Wt Readings from Last 15 Encounters:  11/21/20 69.4 kg  09/02/20 70.8 kg  08/03/20 69.6 kg  01/20/20 72.6 kg  08/29/19 66.6 kg  06/23/19 66.6 kg  05/09/19 72.1 kg  02/19/19 64.8 kg  05/23/18 64.9 kg  01/17/18 66.2 kg  06/16/16 67.6 kg  05/29/16 63.3 kg  09/21/15 72.6 kg  07/26/13 68 kg  06/28/13 66.8 kg  Body mass index is 24.69 kg/m. Patient meets criteria for normal based on current BMI.   Current diet order is regular, patient is consuming approximately 100% of meals at this time and reports no issues with appetite. Pt is currently observation status and is marked for likely discharge today. No nutrition interventions warranted at this time. If nutrition issues arise, please  consult RD.   Larkin Ina, MS, RD, LDN (she/her/hers) RD pager number and weekend/on-call pager number located in [place].

## 2020-11-21 NOTE — Progress Notes (Signed)
   11/21/20 0957  Mobility  Activity Ambulated in room;Transferred:  Chair to bed;Refused mobility (Patient declined hallway ambulation for unspecified reasons.)  Range of Motion/Exercises Active;All extremities  Level of Assistance Standby assist, set-up cues, supervision of patient - no hands on  Assistive Device None  Minutes Stood 1 minutes  Minutes Ambulated 1 minutes  Distance Ambulated (ft) 5 ft  Mobility Ambulated with assistance in room  Mobility Response Tolerated well; HR elevated 105+ at rest  Mobility performed by Mobility specialist  Bed Position Chair  $Mobility charge 1 Mobility   Declined ambulation but requested to get back in bed. Ambulated from chair to bed without assistance. Tolerated well, was left lying supine in bed with all needs met.

## 2020-11-21 NOTE — Evaluation (Signed)
Physical Therapy Evaluation Patient Details Name: Blake Mcknight MRN: [id number] DOB: May 08, 1955 Today's Date: 11/21/2020   History of Present Illness  Pt is a 65 y.o. male who presented 11/20/20 with SOB, hypoxia to 88% with EMS. Pt admitted for COPD exacerbation. Chest x-ray revealed severe bullous emphysema, no acute cardiopulmonary disease. PMH: COPD, HLD, HTN, schizophrenia   Clinical Impression  Pt presents with condition above and deficits mentioned below, see PT Problem List. PTA, pt lives with his sister in a 1-level house with 2 STE. Pt reports his sister assist him with mobility with a rollator and with ADLs. Currently, pt displays deficits in cognition (likely baseline), balance, activity tolerance/aerobic endurance, and lower extremity strength that place him at risk for falls. He was able to perform short bouts of ambulation at a min guard level with a RW today, but fatigued quickly with noted dyspnea. However, his SpO2 remained >/= 98% on 2 L O2 (reports he uses 2L at baseline at home) with gait and >/= 96% on RA with sitting/supine static positions. Pt would benefit from further acute PT services to progress his mobility, but he is close to his reported baseline and has the necessary level of assistance available at home and will therefore likely not need follow-up PT at d/c.    Follow Up Recommendations No PT follow up;Supervision for mobility/OOB    Equipment Recommendations  None recommended by PT    Recommendations for Other Services       Precautions / Restrictions Precautions Precautions: Fall Precaution Comments: monitor SpO2 and HR Restrictions Weight Bearing Restrictions: No      Mobility  Bed Mobility Overal bed mobility: Modified Independent             General bed mobility comments: Pt able to transition supine > sit EOB with HOB slightly elevated in appropriate time frame without assistance.    Transfers Overall transfer level: Needs  assistance Equipment used: Rolling walker (2 wheeled) Transfers: Sit to/from Stand Sit to Stand: Min guard         General transfer comment: Pt pulling up to stand on RW, min guard assist for safety, but quick to rise with some mild unsteadiness but no LOB noted.  Ambulation/Gait Ambulation/Gait assistance: Min guard Gait Distance (Feet): 20 Feet Assistive device: Rolling walker (2 wheeled) Gait Pattern/deviations: Step-through pattern;Decreased stride length Gait velocity: reduced Gait velocity interpretation: <1.8 ft/sec, indicate of risk for recurrent falls General Gait Details: Pt able to lift RW over obstacles and manage in tight spaces without LOB, displaying fairly slow and mostly steady gait. Min guard assist for safety. DOE noted but SpO2 >/= 95% on 2L O2.  Stairs            Wheelchair Mobility    Modified Rankin (Stroke Patients Only)       Balance Overall balance assessment: Needs assistance Sitting-balance support: No upper extremity supported;Feet supported Sitting balance-Leahy Scale: Fair Sitting balance - Comments: Static sitting EOB with supervision for safety. Dyspnea noted with coming to sit from supine and while sitting EOB but SpO2 >/= 96% on RA.   Standing balance support: Bilateral upper extremity supported;During functional activity Standing balance-Leahy Scale: Poor Standing balance comment: Reliant on UE support for stability.                             Pertinent Vitals/Pain Pain Assessment: No/denies pain    Home Living Family/patient expects to be discharged to:: Private  residence Living Arrangements: Other relatives (sister) Available Help at Discharge: Family;Available 24 hours/day Type of Home: House Home Access: Stairs to enter Entrance Stairs-Rails: None Entrance Stairs-Number of Steps: 2 Home Layout: One level Home Equipment: Clinical cytogeneticist - 4 wheels;Wheelchair - manual      Prior Function Level of  Independence: Needs assistance   Gait / Transfers Assistance Needed: Reports sister helps with stair management. Sister helps with ambulation as needed as well.  ADL's / Homemaking Assistance Needed: Sister assists with bathing and dressing. She also helps with shower transfers.  Comments: Pt reports being on 2L O2 at home     Hand Dominance   Dominant Hand: Right    Extremity/Trunk Assessment   Upper Extremity Assessment Upper Extremity Assessment: Defer to OT evaluation    Lower Extremity Assessment Lower Extremity Assessment: Generalized weakness (MMT scores of 3+ to 4 grossly with MMT; denied numbness/tingling)    Cervical / Trunk Assessment Cervical / Trunk Assessment: Normal  Communication   Communication: No difficulties  Cognition Arousal/Alertness: Awake/alert (somewhat lethargic) Behavior During Therapy: WFL for tasks assessed/performed Overall Cognitive Status: No family/caregiver present to determine baseline cognitive functioning                                 General Comments: Stated the date was September 10, but then able to identify that he has not had his brithday yet. Oriented to self, location, and situation though. Pt with conflicting reports of home info, reporting no or yes one moment to questions then changing answers the next moment. According to prior PT entry in June 2022 he had reported confilicting info then also, so this is likely his baseline.      General Comments General comments (skin integrity, edema, etc.): Supine BP 139/83, SpO2 >/= 98% on RA, HR 100-110s; sitting BP 147/94, SpO2 >/= 96% on RA but dyspnea noted, HR 110-120; standing BP 163/141 (tremulous UEs noted), SpO2 >/= 98% on 2L O2 dyspnea noted, HR 120s; BP sitting after gait 147/96, HR 110s, SpO2 >/= 96% on RA    Exercises     Assessment/Plan    PT Assessment Patient needs continued PT services  PT Problem List Decreased strength;Decreased activity  tolerance;Decreased balance;Decreased mobility;Decreased cognition;Cardiopulmonary status limiting activity       PT Treatment Interventions DME instruction;Gait training;Stair training;Functional mobility training;Therapeutic activities;Balance training;Therapeutic exercise;Neuromuscular re-education;Cognitive remediation;Patient/family education    PT Goals (Current goals can be found in the Care Plan section)  Acute Rehab PT Goals Patient Stated Goal: to go home with sister PT Goal Formulation: With patient Time For Goal Achievement: 11/28/20 Potential to Achieve Goals: Good    Frequency Min 3X/week   Barriers to discharge        Co-evaluation               AM-PAC PT "6 Clicks" Mobility  Outcome Measure Help needed turning from your back to your side while in a flat bed without using bedrails?: None Help needed moving from lying on your back to sitting on the side of a flat bed without using bedrails?: None Help needed moving to and from a bed to a chair (including a wheelchair)?: A Little Help needed standing up from a chair using your arms (e.g., wheelchair or bedside chair)?: A Little Help needed to walk in hospital room?: A Little Help needed climbing 3-5 steps with a railing? : A Little 6 Click Score: 20  End of Session Equipment Utilized During Treatment: Gait belt;Oxygen Activity Tolerance: Patient limited by fatigue Patient left: in chair;with call bell/phone within reach;with chair alarm set Nurse Communication: Mobility status;Other (comment) (vitals) PT Visit Diagnosis: Unsteadiness on feet (R26.81);Other abnormalities of gait and mobility (R26.89);Muscle weakness (generalized) (M62.81);Difficulty in walking, not elsewhere classified (R26.2)    Time: [id number] PT Time Calculation (min) (ACUTE ONLY): 27 min   Charges:   PT Evaluation $PT Eval Moderate Complexity: 1 Mod PT Treatments $Therapeutic Activity: 8-22 mins        Moishe Spice, PT,  DPT Acute Rehabilitation Services  Pager: [phone] Office: [place] 11/21/2020, 10:53 AM

## 2020-12-04 ENCOUNTER — Other Ambulatory Visit: Payer: Self-pay | Admitting: *Deleted

## 2020-12-04 NOTE — Patient Outreach (Signed)
[place] [facility]) Care Management  12/04/2020  Blake Mcknight Mar 12, 1956 [id number]   Outgoing call placed to member's sister, no answer, unable to leave message as mailbox is full.  Will follow up within the next 3-4 business days.  [name], [place], MSN [place] [phone]

## 2020-12-05 DIAGNOSIS — H52203 Unspecified astigmatism, bilateral: Secondary | ICD-10-CM | POA: Diagnosis not present

## 2020-12-05 DIAGNOSIS — H524 Presbyopia: Secondary | ICD-10-CM | POA: Diagnosis not present

## 2020-12-07 ENCOUNTER — Other Ambulatory Visit: Payer: Self-pay | Admitting: *Deleted

## 2020-12-07 NOTE — Patient Outreach (Signed)
[place] [facility]) Care Management  12/07/2020  Blake Mcknight [birth year] [id number]   Outreach attempt #2 to sister, successful.  Denies any urgent concerns, encouraged to contact this care manager with questions.  Agrees to follow up within the next month.   Goals Addressed             This Visit's Progress    [facility] - Make and Keep All Appointments   On track      Timeframe:  Short-Term Goal Priority:  High Start Date:           5/24                  Expected End Date:  8/24  Barriers: Health Behaviors                        - ask family or friend for a ride - keep a calendar with appointment dates    Why is this important?   Part of staying healthy is seeing the doctor for follow-up care.  If you forget your appointments, there are some things you can do to stay on track.    Notes:   11/11 - Sister will call to schedule PCP appointment   5/24 - Patient admitted to hospital for COPD exacerbation 5/19-5/21.  Sister will call PCP to schedule appointment today  6/7 - Sister report follow up appointment with PCP today.  Will also make appointments with urology and eye doctor  6/24 - sister called PCP since recent discharge, no appointment scheduled yet, waiting for nurse to return to office as requested by PCP  7/26 - Sister report member has had telephone visit, wilface to face visit on 8/16  8/18 - State office visit with PCP went well, per sister, MD was pleased with lung No change in medication regime  9/23 - Patient has appointment with PCP, sister called post discharge, no sooner appointment provided as member remains on same COPD meds.  If member's condition change, sister will call for sooner appointment     THN - Symptom Exacerbation Prevented or Minimized   Not on track    Timeframe:  Long-Range Goal Priority:  High Start Date:     9/23                    Expected End Date:     06/06/2021 (goal date reset)                  Barriers: Knowledge   Notes: 12/13 - Long term COPD management discussed with sister, discussed management of inhalers and nebulizer [phone] Patient is still smoking randomly per sister not every day and just taking a puff.    5/24 - Patient admitted to hospital for COPD exacerbation 5/19-5/21.  Reviewed discharge instructions with sister, report he is taking meds and using inhaler/nebulizer as instructed.  6/7 - Per sister, patient currently denies any shortness of breath, will continue medication management as prescribed  6/24 - Patient readmitted to hospital 6/19-6/22, most recent discharge instructions reviewed with sister.  Confirms new medications has started, will continue to monitor.    65 - Per sister, member has been steadily improving. Using inhalers and nebulizers as instructed, no longer smoking.  8/18 - No current issues with shortness of breath or chest discomfort.    9/23 - Patient readmitted for COPD exacerbation (observation) from 9/6-9/7.  Placed on antibiotic and steroid, otherwise no changes  Blake Wandler, RN, MSN [facility] Care Management  Community Care Manager [phone]  

## 2021-01-04 ENCOUNTER — Other Ambulatory Visit: Payer: Self-pay | Admitting: *Deleted

## 2021-01-04 NOTE — Patient Outreach (Signed)
[place] [facility]) Care Management  01/04/2021  Blake Mcknight Apr 13, 1955 [id number]   Outgoing call placed to member's sister.  Denies any urgent concerns, encouraged to contact this care manager with questions.  Agrees to follow up within the next month.   Goals Addressed             This Visit's Progress    THN - Make and Keep All Appointments   On track      Timeframe:  Short-Term Goal Priority:  High Start Date:           10/21              Expected End Date:  04/06/2021  Barriers: Health Behaviors                        - ask family or friend for a ride - keep a calendar with appointment dates    Why is this important?   Part of staying healthy is seeing the doctor for follow-up care.  If you forget your appointments, there are some things you can do to stay on track.    Notes:   11/11 - Sister will call to schedule PCP appointment   5/24 - Patient admitted to hospital for COPD exacerbation 5/19-5/21.  Sister will call PCP to schedule appointment today  6/7 - Sister report follow up appointment with PCP today.  Will also make appointments with urology and eye doctor  6/24 - sister called PCP since recent discharge, no appointment scheduled yet, waiting for nurse to return to office as requested by PCP  7/26 - Sister report member has had telephone visit, wilface to face visit on 8/16  8/18 - State office visit with PCP went well, per sister, MD was pleased with lung No change in medication regime  9/23 - Patient has appointment with PCP, sister called post discharge, no sooner appointment provided as member remains on same COPD meds.  If member's condition change, sister will call for sooner appointment  10/21 - Per sister, appointment with PCP on 11/3, psych on 11/9, and will schedule with urology as she has noticed blood in his urine intermittently. No pain or discomfort with urination     THN - Symptom Exacerbation Prevented or Minimized    On track    Timeframe:  Long-Range Goal Priority:  High Start Date:     9/23                    Expected End Date:     06/06/2021 (goal date reset)                 Barriers: Knowledge   Notes: 12/13 - Long term COPD management discussed with sister, discussed management of inhalers and nebulizer [phone] Patient is still smoking randomly per sister not every day and just taking a puff.    5/24 - Patient admitted to hospital for COPD exacerbation 5/19-5/21.  Reviewed discharge instructions with sister, report he is taking meds and using inhaler/nebulizer as instructed.  6/7 - Per sister, patient currently denies any shortness of breath, will continue medication management as prescribed  6/24 - Patient readmitted to hospital 6/19-6/22, most recent discharge instructions reviewed with sister.  Confirms new medications has started, will continue to monitor.    21 - Per sister, member has been steadily improving. Using inhalers and nebulizers as instructed, no longer smoking.  8/18 - No current issues with  shortness of breath or chest discomfort.    9/23 - Patient readmitted for COPD exacerbation (observation) from 9/6-9/7.  Placed on antibiotic and steroid, otherwise no changes  10/21 - No readmissions in the last month, sister report breathing is better, having more good days lately.  Continues with inhalers and nebulizer treatments        [name], RN, MSN [place] [phone]

## 2021-01-15 ENCOUNTER — Other Ambulatory Visit (HOSPITAL_COMMUNITY): Payer: Self-pay

## 2021-01-17 DIAGNOSIS — E7800 Pure hypercholesterolemia, unspecified: Secondary | ICD-10-CM | POA: Diagnosis not present

## 2021-01-17 DIAGNOSIS — Z1211 Encounter for screening for malignant neoplasm of colon: Secondary | ICD-10-CM | POA: Diagnosis not present

## 2021-01-17 DIAGNOSIS — K219 Gastro-esophageal reflux disease without esophagitis: Secondary | ICD-10-CM | POA: Diagnosis not present

## 2021-01-17 DIAGNOSIS — J441 Chronic obstructive pulmonary disease with (acute) exacerbation: Secondary | ICD-10-CM | POA: Diagnosis not present

## 2021-01-17 DIAGNOSIS — N401 Enlarged prostate with lower urinary tract symptoms: Secondary | ICD-10-CM | POA: Diagnosis not present

## 2021-01-17 DIAGNOSIS — F319 Bipolar disorder, unspecified: Secondary | ICD-10-CM | POA: Diagnosis not present

## 2021-01-17 DIAGNOSIS — Z23 Encounter for immunization: Secondary | ICD-10-CM | POA: Diagnosis not present

## 2021-01-17 DIAGNOSIS — Z79899 Other long term (current) drug therapy: Secondary | ICD-10-CM | POA: Diagnosis not present

## 2021-01-17 DIAGNOSIS — K267 Chronic duodenal ulcer without hemorrhage or perforation: Secondary | ICD-10-CM | POA: Diagnosis not present

## 2021-01-17 DIAGNOSIS — M2550 Pain in unspecified joint: Secondary | ICD-10-CM | POA: Diagnosis not present

## 2021-01-17 DIAGNOSIS — I119 Hypertensive heart disease without heart failure: Secondary | ICD-10-CM | POA: Diagnosis not present

## 2021-01-17 DIAGNOSIS — M159 Polyosteoarthritis, unspecified: Secondary | ICD-10-CM | POA: Diagnosis not present

## 2021-01-23 ENCOUNTER — Other Ambulatory Visit: Payer: Self-pay

## 2021-01-23 ENCOUNTER — Telehealth (HOSPITAL_BASED_OUTPATIENT_CLINIC_OR_DEPARTMENT_OTHER): Payer: Medicare Other | Admitting: Psychiatry

## 2021-01-23 ENCOUNTER — Encounter (HOSPITAL_COMMUNITY): Payer: Self-pay | Admitting: Psychiatry

## 2021-01-23 DIAGNOSIS — F99 Mental disorder, not otherwise specified: Secondary | ICD-10-CM

## 2021-01-23 DIAGNOSIS — F200 Paranoid schizophrenia: Secondary | ICD-10-CM | POA: Diagnosis not present

## 2021-01-23 DIAGNOSIS — F5105 Insomnia due to other mental disorder: Secondary | ICD-10-CM | POA: Diagnosis not present

## 2021-01-23 MED ORDER — MIRTAZAPINE 30 MG PO TABS: 30.0000 mg | ORAL_TABLET | Freq: Every day | ORAL | 1 refills | Status: DC

## 2021-01-23 MED ORDER — BENZTROPINE MESYLATE 2 MG PO TABS: 2.0000 mg | ORAL_TABLET | Freq: Two times a day (BID) | ORAL | 1 refills | Status: DC

## 2021-01-23 MED ORDER — RISPERIDONE 3 MG PO TABS: 3.0000 mg | ORAL_TABLET | Freq: Two times a day (BID) | ORAL | 1 refills | Status: DC

## 2021-01-23 NOTE — Progress Notes (Signed)
Virtual Visit via Telephone Note  I connected with Blake Mcknight on 01/23/21 at  2:20 PM EST by telephone and verified that I am speaking with the correct person using two identifiers.  Location: Patient: Home Provider: Home Office   I discussed the limitations, risks, security and privacy concerns of performing an evaluation and management service by telephone and the availability of in person appointments. I also discussed with the patient that there may be a patient responsible charge related to this service. The patient expressed understanding and agreed to proceed.   History of Present Illness: Patient is evaluated on the phone.  He is a poor historian but his sister going helped to translate.  As per sister patient is taking all his medication and denies any recent anger, mania, delusions or impulsive behavior.  He will occasionally talk to himself but no irritability.  He sleeps good.  He requires help for his ADLs.  His memory concentration is not as good.  His Sister Blake Mcknight is very involved in his care.  Patient was admitted on the medical floor in September due to exacerbation of COPD.  He is feeling better now.  We have cut down his Cogentin but patient started to have tremors and his sister called back to put him back on original dose of benztropine 2 mg twice a day.  His sister reported that tremors are much better.  He has no issue with eating.  Patient and his sister does not want to change the medication since symptoms are manageable and his behavior is under control.  Patient denies any suicidal thoughts or homicidal thought.      Past Psychiatric History: Reviewed. H/O schizophrenia and admitted at [facility] years ago. H/O paranoid, delusional, disorganized behavior and having mood swings.  Psychiatric Specialty Exam: Physical Exam  Review of Systems  Weight 153 lb (69.4 kg).Body mass index is 24.69 kg/m.  General Appearance: NA  Eye Contact:  NA  Speech:  Slow   Volume:  Decreased  Mood:  Euthymic  Affect:  NA  Thought Process:  Descriptions of Associations: Loose  Orientation:  Full (Time, Place, and Person)  Thought Content:  Abstract Reasoning  Suicidal Thoughts:  No  Homicidal Thoughts:  No  Memory:  Immediate;   Fair Recent;   Poor Remote;   Poor  Judgement:  Fair  Insight:  Shallow  Psychomotor Activity:  NA  Concentration:  Concentration: Fair and Attention Span: Fair  Recall:  [employer] of Knowledge:  Fair  Language:  Fair  Akathisia:  No  Handed:  Right  AIMS (if indicated):     Assets:  Desire for Improvement Housing Social Support  ADL's:  Intact  Cognition:  Impaired,  Mild  Sleep:   good      Assessment and Plan: Schizophrenia chronic paranoid type.  Insomnia due to mental disorder.  I reviewed blood work results from recent hospitalization.  Patient is stable on his current medication and his symptoms are manageable.  Continue risperidone 3 mg twice a day, mirtazapine 30 mg at bedtime and Cogentin 2 mg twice a day.  Discussed medication side effects and benefits.  Patient prefer local pharmacy and a 90-day supply.  Recommended to call us back if there is any question or any concern.  Follow-up in 6 months.  Follow Up Instructions:    I discussed the assessment and treatment plan with the patient. The patient was provided an opportunity to ask questions and all were answered.  The patient agreed with the plan and demonstrated an understanding of the instructions.   The patient was advised to call back or seek an in-person evaluation if the symptoms worsen or if the condition fails to improve as anticipated.  I provided 18 minutes of non-face-to-face time during this encounter.   Kathlee Nations, MD

## 2021-01-31 ENCOUNTER — Other Ambulatory Visit: Payer: Self-pay | Admitting: *Deleted

## 2021-01-31 NOTE — Patient Outreach (Signed)
[place] [facility]) Care Management  01/31/2021  BYARD CARRANZA June 08, 1955 [id number]   Outgoing call placed to member's sister, no answer, unable to leave message.  Will follow up within the next 3-4 business days.  [name], [place], MSN [place] [phone]

## 2021-02-01 ENCOUNTER — Other Ambulatory Visit: Payer: Self-pay | Admitting: *Deleted

## 2021-02-01 NOTE — Patient Outreach (Signed)
[place] [facility]) Care Management  02/01/2021  BRAXDEN LOVERING 23-Apr-1955 [id number]   Incoming call received from sister, [place].  Denies any urgent concerns, encouraged to contact this care manager with questions.  Agrees to follow up within the next month.  Care Plan : COPD (Adult)  Updates made by [name], RN since 02/01/2021 12:00 AM     Problem: Disease Progression (COPD)      Long-Range Goal: Disease Progression Minimized or Managed Completed 02/01/2021  Start Date: 12/07/2020  Expected End Date: 03/09/2021  Recent Progress: On track  Priority: High  Note:   Evidence-based guidance:  Identify current smoking/tobacco use; provide smoking cessation intervention.  Assess symptom control by the frequency and type of symptoms, reliever use and activity limitation at every encounter.  Assess risk for exacerbation (flare up) by evaluating spirometry, pulse oximetry, reliever use, presentation of symptoms and activity limitation; anticipate treatment adjustment based on risks and resources.  Develop and/or review and reinforce use of COPD rescue (action) plan even when symptoms are controlled or infrequent.  Ask patient to bring inhaler to all visits; assess and reinforce correct technique; address barriers to proper inhaler use, such as older age, use of multiple devices and lack of understanding.   Identify symptom triggers, such as smoking, virus, weather change, emotional upset, exercise, obesity and environmental allergen; consider reduction of work-exposure versus elimination to avoid compromising employment.  Correlate presentation to comorbidity, such as diabetes, heart failure, obstructive sleep apnea, depression and anxiety, which may worsen symptoms.  Prepare for individualized pharmacologic therapy that may include LABA (long-acting beta-2 agonist), LAMA (long-acting muscarinic antagonist), SABA (short-acting beta-2 agonist) oral or inhaled corticosteroid.   Promote participation in pulmonary rehabilitation for breathing exercises, skills training, improved exercise capacity, mood and quality of life; address barriers to participation.  Promote physical activity or exercise to improve or maintain exercise capacity, based on tolerance that may include walking, water exercise, cycling or limb muscle strength training.  Promote use of energy conservation and activity pacing techniques.  Promote use of breathing and coughing techniques, such as inspiratory muscle training, pursed-lip breathing, diaphragmatic breathing, pranayama yoga breathing or huff cough.  Screen for malnutrition risk factors, such as unintentional weight loss and poor oral intake; refer to dietitian if identified.  Consider recommendation for oral drink supplement or multivitamin and mineral supplements if suspect inadequate oral intake or micronutrient deficiencies.   Screen for obstructive sleep apnea; prepare patient for polysomnography based on risk and presentation.  Prepare patient for use of long-term oxygen and noninvasive ventilation to relieve hypercapnia, hypoxemia, obstructive sleep apnea and reduce work of breathing.  Prepare patient with worsening disease for surgical interventions that may include bronchoscopy, lung volume reduction surgery, bullectomy or lung transplantation.   Notes:    5/24 - Patient admitted to hospital for COPD exacerbation 5/19-5/21  9/23 - Readmitted for observation of COPD exacerbation from 9/6-9/7  11/18 - Resolving due to duplicate goal     Task: Alleviate Barriers to COPD Management Completed 02/01/2021  Due Date: 03/09/2021  Note:   Care Management Activities:    - barriers to treatment managed - breathing techniques encouraged - communicable disease prevention promoted    Notes:  Provided educational material on COPD exacerbation Proved a calendar book to keep up with appointments Provided educational material on COPD and  activity Provided educational material on COPDandrest  12/13 - COPD managed, goal ongoing to prevent hospitalizations    Care Plan : [facility] Plan of Care (Adult)  Updates made by [name], RN since 02/01/2021 12:00 AM     Problem: Difficulty with management of chronic medical conditions, HTN and COPD   Priority: High     Long-Range Goal: Memer and family will verbalize adequate plan of care to manage chronic health conditions, HTN and COPD   Start Date: 02/01/2021  Expected End Date: 07/31/2021  Priority: High  Note:   Current Barriers:  Chronic Disease Management support and education needs related to HTN and COPD  RNCM Clinical Goal(s):  Patient will verbalize understanding of plan for management of HTN and COPD take all medications exactly as prescribed and will call provider for medication related questions continue to work with RN Care Manager to address care management and care coordination needs related to  HTN and COPD not experience hospital admission. Hospital Admissions in last 6 months = 3  through collaboration with RN Care manager, provider, and care team.   Interventions: Inter-disciplinary care team collaboration (see longitudinal plan of care) Evaluation of current treatment plan related to  self management and patient's adherence to plan as established by provider  Hypertension Interventions: Last practice recorded BP readings:  BP Readings from Last 3 Encounters:  11/21/20 (!) 152/96  09/05/20 (!) 151/81  08/04/20 (!) 141/85  Most recent eGFR/CrCl: No results found for: EGFR  No components found for: CRCL  Evaluation of current treatment plan related to hypertension self management and patient's adherence to plan as established by provider; Provided education to patient re: stroke prevention, s/s of heart attack and stroke; Discussed plans with patient for ongoing care management follow up and provided patient with direct contact information for care  management team; Advised patient, providing education and rationale, to monitor blood pressure daily and record, calling PCP for findings outside established parameters;   COPD Interventions:   Provided patient with basic written and verbal COPD education on self care/management/and exacerbation prevention; Advised patient to track and manage COPD triggers;  Provided instruction about proper use of medications used for management of COPD including inhalers; Advised patient to self assesses COPD action plan zone and make appointment with provider if in the yellow zone for 48 hours without improvement;   Update 11/18 - Sister report member has been doing well over the last several weeks.  He is using inhalers and nebulizers as indicated.  Was seen by PCP, no changes made in medications.  Discussed upcoming holidays and keeping member safe from sick contacts, she verbalizes understanding.    Patient Goals/Self-Care Activities: Patient will self administer medications as prescribed Patient will attend all scheduled provider appointments Patient will continue to perform ADL's independently  Follow Up Plan:  Telephone follow up appointment with care management team member scheduled for:  12/16 The patient has been provided with contact information for the care management team and has been advised to call with any health related questions or concerns.       [name], [place], MSN [place] [phone]

## 2021-02-05 ENCOUNTER — Ambulatory Visit: Payer: Self-pay | Admitting: *Deleted

## 2021-03-01 ENCOUNTER — Other Ambulatory Visit: Payer: Self-pay | Admitting: *Deleted

## 2021-03-01 NOTE — Patient Outreach (Signed)
[place] [facility]) Care Management  03/01/2021  ALSON MCPHEETERS [birth year] [id number]   Outgoing call placed to member's sister, no answer, HIPAA compliant voice message left.  Will follow up within the next 3-4 business days.  [name], RN, MSN, [place] Manager [phone]

## 2021-03-06 ENCOUNTER — Other Ambulatory Visit: Payer: Self-pay | Admitting: *Deleted

## 2021-03-06 NOTE — Patient Outreach (Signed)
[place] [facility]) Care Management  03/06/2021  Blake Mcknight Dec 15, 1955 [id number]   Outreach attempt #2, unsuccessful to sister/POA, HIPAA compliant voice message left.  Will send outreach letter and follow up within the next 3-4 business days.  [name], RN, MSN, [place] Manager [phone]

## 2021-03-07 ENCOUNTER — Emergency Department (HOSPITAL_COMMUNITY)
Admission: EM | Admit: 2021-03-07 | Discharge: 2021-03-08 | Disposition: A | Discharge status: Home / Self Care | Payer: Medicare Other | Attending: Emergency Medicine | Admitting: Emergency Medicine

## 2021-03-07 ENCOUNTER — Other Ambulatory Visit: Payer: Self-pay

## 2021-03-07 DIAGNOSIS — K0510 Chronic gingivitis, plaque induced: Secondary | ICD-10-CM | POA: Diagnosis not present

## 2021-03-07 DIAGNOSIS — Z7951 Long term (current) use of inhaled steroids: Secondary | ICD-10-CM | POA: Diagnosis not present

## 2021-03-07 DIAGNOSIS — I10 Essential (primary) hypertension: Secondary | ICD-10-CM | POA: Insufficient documentation

## 2021-03-07 DIAGNOSIS — J449 Chronic obstructive pulmonary disease, unspecified: Secondary | ICD-10-CM | POA: Diagnosis not present

## 2021-03-07 DIAGNOSIS — K029 Dental caries, unspecified: Secondary | ICD-10-CM

## 2021-03-07 DIAGNOSIS — T464X5A Adverse effect of angiotensin-converting-enzyme inhibitors, initial encounter: Secondary | ICD-10-CM

## 2021-03-07 DIAGNOSIS — Z87891 Personal history of nicotine dependence: Secondary | ICD-10-CM | POA: Insufficient documentation

## 2021-03-07 DIAGNOSIS — Z79899 Other long term (current) drug therapy: Secondary | ICD-10-CM | POA: Diagnosis not present

## 2021-03-07 DIAGNOSIS — R220 Localized swelling, mass and lump, head: Secondary | ICD-10-CM | POA: Diagnosis not present

## 2021-03-07 DIAGNOSIS — T783XXA Angioneurotic edema, initial encounter: Secondary | ICD-10-CM | POA: Diagnosis not present

## 2021-03-07 LAB — COMPREHENSIVE METABOLIC PANEL
ALT: 22 U/L (ref 0–44)
AST: 16 U/L (ref 15–41)
Albumin: 3.5 g/dL (ref 3.5–5.0)
Alkaline Phosphatase: 111 U/L (ref 38–126)
Anion gap: 11 (ref 5–15)
BUN: 7 mg/dL — ABNORMAL LOW (ref 8–23)
CO2: 23 mmol/L (ref 22–32)
Calcium: 9.5 mg/dL (ref 8.9–10.3)
Chloride: 98 mmol/L (ref 98–111)
Creatinine, Ser: 0.8 mg/dL (ref 0.61–1.24)
GFR, Estimated: >60 mL/min (ref >60)
Glucose, Bld: 139 mg/dL — ABNORMAL HIGH (ref 70–99)
Potassium: 4.2 mmol/L (ref 3.5–5.1)
Sodium: 132 mmol/L — ABNORMAL LOW (ref 135–145)
Total Bilirubin: 1.1 mg/dL (ref 0.3–1.2)
Total Protein: 7.5 g/dL (ref 6.5–8.1)

## 2021-03-07 LAB — CBC WITH DIFFERENTIAL/PLATELET
Abs Immature Granulocytes: 0.1 10*3/uL — ABNORMAL HIGH (ref 0.00–0.07)
Basophils Absolute: 0.1 10*3/uL (ref 0.0–0.1)
Basophils Relative: 0 %
Eosinophils Absolute: 0.1 10*3/uL (ref 0.0–0.5)
Eosinophils Relative: 1 %
HCT: 44.5 % (ref 39.0–52.0)
Hemoglobin: 15.3 g/dL (ref 13.0–17.0)
Immature Granulocytes: 1 %
Lymphocytes Relative: 18 %
Lymphs Abs: 2.8 10*3/uL (ref 0.7–4.0)
MCH: 29.8 pg (ref 26.0–34.0)
MCHC: 34.4 g/dL (ref 30.0–36.0)
MCV: 86.6 fL (ref 80.0–100.0)
Monocytes Absolute: 1.8 10*3/uL — ABNORMAL HIGH (ref 0.1–1.0)
Monocytes Relative: 11 %
Neutro Abs: 10.6 10*3/uL — ABNORMAL HIGH (ref 1.7–7.7)
Neutrophils Relative %: 69 %
Platelets: 311 10*3/uL (ref 150–400)
RBC: 5.14 MIL/uL (ref 4.22–5.81)
RDW: 13.3 % (ref 11.5–15.5)
WBC: 15.4 10*3/uL — ABNORMAL HIGH (ref 4.0–10.5)
nRBC: 0 % (ref 0.0–0.2)

## 2021-03-07 MED ORDER — EPINEPHRINE 0.3 MG/0.3ML IJ SOAJ
0.3000 mg | Freq: Once | INTRAMUSCULAR | Status: DC
  Filled 2021-03-07: qty 0.6

## 2021-03-07 NOTE — ED Provider Notes (Signed)
Emergency Medicine Provider Triage Evaluation Note  Blake Mcknight , a 65 y.o. male  was evaluated in triage.  Pt complains of lip swelling onset 2 days ago.  Pt does take atorvastatin - not a new medication.  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 Wife reports no trouble swallowing.  No known allergies.  Pt with a hx of severe COPD. Pt is on oxygen 2L via [place] at baseline and albuterol q4 hours.  Pt's last treatment was 9:30PM.  Review of Systems  Positive: Facial swelling, SOB Negative: Fever, chills  Physical Exam  BP 117/78 (BP Location: Right Arm)    Pulse (!) 113    Temp 98.4 F (36.9 C) (Oral)    Resp 20    SpO2 93%  Gen:   Awake, no distress   Resp:  Normal effort  MSK:   Moves extremities without difficulty  Other:  Significant swelling of the lower lip; no swelling of the tongue, tachycardia, wheezing throughout.  Medical Decision Making  Medically screening exam initiated at 10:42 PM.  Appropriate orders placed.  Blake Mcknight was informed that the remainder of the evaluation will be completed by another provider, this initial triage assessment does not replace that evaluation, and the importance of remaining in the ED until their evaluation is complete.  Suspect angioedema.  Epi IM and basic labs ordered.    Blake Mcknight, Gwenlyn Perking 03/07/21 2246    Drenda Freeze, MD 03/08/21 [phone]

## 2021-03-07 NOTE — ED Triage Notes (Signed)
Pt c/o swollen lip x 2 days. Pt unsure if allergic to anything.

## 2021-03-08 DIAGNOSIS — R220 Localized swelling, mass and lump, head: Secondary | ICD-10-CM | POA: Diagnosis not present

## 2021-03-08 MED ORDER — CLINDAMYCIN PHOSPHATE 600 MG/50ML IV SOLN
600.0000 mg | Freq: Once | INTRAVENOUS | Status: AC
  Administered 2021-03-08: 01:00:00 600 mg via INTRAVENOUS
  Filled 2021-03-08: qty 50

## 2021-03-08 MED ORDER — CLINDAMYCIN HCL 300 MG PO CAPS: 300.0000 mg | ORAL_CAPSULE | Freq: Four times a day (QID) | ORAL | 0 refills | Status: DC

## 2021-03-08 MED ORDER — DIPHENHYDRAMINE HCL 50 MG/ML IJ SOLN
25.0000 mg | Freq: Once | INTRAMUSCULAR | Status: AC
  Administered 2021-03-08: 01:00:00 25 mg via INTRAVENOUS
  Filled 2021-03-08: qty 1

## 2021-03-08 MED ORDER — PREDNISONE 10 MG PO TABS: 20.0000 mg | ORAL_TABLET | Freq: Two times a day (BID) | ORAL | 0 refills | Status: DC

## 2021-03-08 MED ORDER — METHYLPREDNISOLONE SODIUM SUCC 125 MG IJ SOLR
125.0000 mg | Freq: Once | INTRAMUSCULAR | Status: AC
  Administered 2021-03-08: 01:00:00 125 mg via INTRAVENOUS
  Filled 2021-03-08: qty 2

## 2021-03-08 NOTE — Discharge Instructions (Signed)
Begin taking clindamycin and prednisone as prescribed.  Begin taking Benadryl 25 mg every 6 hours for the next 3 days.  This medication is available over-the-counter without a prescription.  Stop taking enalapril.  Monitor your blood pressures at home and keep a record of them.  Take this with you to your follow-up doctors appointment in the next 1 to 2 weeks.  Return to the emergency department if symptoms significantly worsen or change.

## 2021-03-08 NOTE — ED Provider Notes (Signed)
[facility] EMERGENCY DEPARTMENT Provider Note   CSN: [id number] Arrival date & time: 03/07/21  2233     History Chief Complaint  Patient presents with   Allergic Reaction    Blake Mcknight is a 65 y.o. male.  Patient is a 65 year old male with past medical history of COPD, hypertension, hyperlipidemia.  Patient presenting today for evaluation of lip swelling.  His lower lip has been swollen for the past 2 days.  This began in the absence of any injury or trauma.  He denies any difficulty breathing or throat swelling.  He does have multiple decayed teeth and gum erosion, but no obvious abscess.  Patient also takes enalapril for blood pressure.  The history is provided by the patient.  Allergic Reaction Presenting symptoms: swelling   Severity:  Moderate Duration:  2 days Relieved by:  Nothing Worsened by:  Nothing Ineffective treatments:  None tried     Past Medical History:  Diagnosis Date   COPD (chronic obstructive pulmonary disease) ([place])    with bullous emphysema.    Heavy cigarette smoker before 2003   pt claims only 10 cigs per day, never heavier amounts.    HLD (hyperlipidemia)    Hypertension    Schizophrenia ([place]) 06/28/2013   This is a chronic condition and he lives with family    Patient Active Problem List   Diagnosis Date Noted   COPD with acute exacerbation ([place]) 09/02/2020   Hypoxia    COPD exacerbation ([place]) 06/05/2019   Do not resuscitate 02/18/2019   Acute on chronic respiratory failure with hypoxia ([place]) 05/25/2016   Bullous emphysema ([place]) 07/26/2013   Schizophrenia ([place]) 06/28/2013   Tobacco use disorder 06/06/2013   Abdominal pain 06/03/2013   HTN (hypertension) 06/03/2013   HLD (hyperlipidemia) 06/03/2013   Chronic obstructive pulmonary disease with acute exacerbation ([place]) 06/03/2013   Calculus of bile duct without mention of cholecystitis or obstruction 06/03/2013    Past Surgical History:  Procedure  Laterality Date   CHOLECYSTECTOMY N/A 06/06/2013   Procedure: LAPAROSCOPIC CHOLECYSTECTOMY WITH ATTEMPTED INTRAOPERATIVE CHOLANGIOGRAM;  Surgeon: Zenovia Jarred, MD;  Location: [place];  Service: General;  Laterality: N/A;   ERCP N/A 06/03/2013   Procedure: ENDOSCOPIC RETROGRADE CHOLANGIOPANCREATOGRAPHY (ERCP);  Surgeon: Ladene Artist, MD;  Location: [facility] ENDOSCOPY;  Service: Endoscopy;  Laterality: N/A;       Family History  Problem Relation Age of Onset   Diabetes Mellitus II Mother    Diabetes Mother    Heart disease Father    Stroke Maternal Aunt    CAD Neg Hx     Social History   Tobacco Use   Smoking status: Former    Packs/day: 1.00    Years: 41.00    Pack years: 41.00    Types: Cigarettes   Smokeless tobacco: Never   Tobacco comments:    Quit 2-3 months ago  Vaping Use   Vaping Use: Never used  Substance Use Topics   Alcohol use: No   Drug use: No    Home Medications Prior to Admission medications   Medication Sig Start Date End Date Taking? Authorizing Provider  albuterol (PROVENTIL) (2.5 MG/3ML) 0.083% nebulizer solution Take 2.5 mg by nebulization every 4 (four) hours.    [provider]  albuterol (VENTOLIN HFA) 108 (90 Base) MCG/ACT inhaler Inhale 2 puffs into the lungs every 4 (four) hours as needed for wheezing or shortness of breath. 02/20/19   Aline August, MD  Ascorbic Acid (VITAMIN C  PO) Take 1 tablet by mouth 3 (three) times a week.     [provider]  atorvastatin (LIPITOR) 10 MG tablet Take 10 mg by mouth daily. 09/21/15   [provider]  benzonatate (TESSALON) 200 MG capsule Take 1 capsule (200 mg total) by mouth 2 (two) times daily as needed for cough. 09/05/20   Mariel Aloe, MD  benztropine (COGENTIN) 2 MG tablet Take 1 tablet (2 mg total) by mouth 2 (two) times daily. 01/23/21   Arfeen, Arlyce Harman, MD  BREZTRI AEROSPHERE 160-9-4.8 MCG/ACT AERO Inhale 2 puffs into the lungs in the morning and at bedtime. 07/18/20    [provider]  carvedilol (COREG) 3.125 MG tablet Take 1 tablet (3.125 mg total) by mouth 2 (two) times daily with a meal. Patient taking differently: No sig reported 02/20/19   Aline August, MD  cetirizine (ZYRTEC) 10 MG tablet Take 10 mg by mouth daily as needed for allergies or rhinitis.    [provider]  cholecalciferol (VITAMIN D) 25 MCG (1000 UNIT) tablet Take 4,000 Units by mouth every evening.     [provider]  Cyanocobalamin (B-12 PO) Take 1 tablet by mouth every other day.    [provider]  enalapril (VASOTEC) 10 MG tablet Take 10 mg by mouth daily. 06/04/16   [provider]  famotidine (PEPCID) 40 MG tablet Take 40 mg by mouth 2 (two) times daily. 12/28/18   [provider]  guaiFENesin-dextromethorphan (ROBITUSSIN DM) 100-10 MG/5ML syrup Take 10 mLs by mouth every 4 (four) hours as needed for cough. 02/20/19   Aline August, MD  HYDROCODONE-CHLORPHENIRAMINE PO Take 5 mLs by mouth every 6 (six) hours as needed (cough and pain).    [provider]  ipratropium (ATROVENT) 0.02 % nebulizer solution Take 0.5 mg by nebulization every 4 (four) hours.    [provider]  mirtazapine (REMERON) 30 MG tablet Take 1 tablet (30 mg total) by mouth at bedtime. 01/23/21 01/23/22  Arfeen, Arlyce Harman, MD  montelukast (SINGULAIR) 10 MG tablet Take 1 tablet (10 mg total) by mouth daily. Patient not taking: No sig reported 06/23/19 09/21/19  Harvie Heck, MD  multivitamin (ONE-A-DAY MEN'S) TABS tablet Take 1 tablet by mouth daily.    [provider]  OXYGEN Inhale 2 L/min into the lungs continuous.     [provider]  polyethylene glycol (MIRALAX / GLYCOLAX) 17 g packet Take 17 g by mouth daily as needed for mild constipation.    [provider]  predniSONE (DELTASONE) 10 MG tablet Take 4 tablets (40 mg) daily for 2 days, then, Take 3 tablets (30 mg) daily for 2 days, then, Take 2 tablets (20 mg) daily for 2  days, then, Take 1 tablets (10 mg) daily for 1 days, then stop 11/21/20   Pokhrel, Corrie Mckusick, MD  risperiDONE (RISPERDAL) 3 MG tablet Take 1 tablet (3 mg total) by mouth 2 (two) times daily. 01/23/21   Arfeen, Arlyce Harman, MD  sodium chloride (OCEAN) 0.65 % SOLN nasal spray Place 1 spray into both nostrils as needed for congestion.    [provider]  tamsulosin (FLOMAX) 0.4 MG CAPS capsule Take 0.4 mg by mouth at bedtime. 07/23/20   [provider]    Allergies    Patient has no known allergies.  Review of Systems   Review of Systems  All other systems reviewed and are negative.  Physical Exam Updated Vital Signs BP 116/87    Pulse (!) 106  Temp 98.4 F (36.9 C) (Oral)    Resp 20    Ht 5\' 6"  (1.676 m)    Wt 70 kg    SpO2 99%    BMI 24.91 kg/m   Physical Exam Vitals and nursing note reviewed.  Constitutional:      General: He is not in acute distress.    Appearance: He is well-developed. He is not diaphoretic.  HENT:     Head: Normocephalic and atraumatic.     Mouth/Throat:     Mouth: Mucous membranes are moist.     Pharynx: No oropharyngeal exudate or posterior oropharyngeal erythema.     Comments: There is swelling of the lower lip.  There are also multiple decayed teeth and gingivitis to the lower front teeth. Cardiovascular:     Rate and Rhythm: Normal rate and regular rhythm.     Heart sounds: No murmur heard.   No friction rub.  Pulmonary:     Effort: Pulmonary effort is normal. No respiratory distress.     Breath sounds: Normal breath sounds. No stridor. No wheezing or rales.  Abdominal:     General: Bowel sounds are normal. There is no distension.     Palpations: Abdomen is soft.     Tenderness: There is no abdominal tenderness.  Musculoskeletal:        General: Normal range of motion.     Cervical back: Normal range of motion and neck supple.  Skin:    General: Skin is warm and dry.  Neurological:     Mental Status: He is alert and oriented to person,  place, and time.     Coordination: Coordination normal.    ED Results / Procedures / Treatments   Labs (all labs ordered are listed, but only abnormal results are displayed) Labs Reviewed  CBC WITH DIFFERENTIAL/PLATELET - Abnormal; Notable for the following components:      Result Value   WBC 15.4 (*)    Neutro Abs 10.6 (*)    Monocytes Absolute 1.8 (*)    Abs Immature Granulocytes 0.10 (*)    All other components within normal limits  COMPREHENSIVE METABOLIC PANEL - Abnormal; Notable for the following components:   Sodium 132 (*)    Glucose, Bld 139 (*)    BUN 7 (*)    All other components within normal limits    EKG None  Radiology No results found.  Procedures Procedures   Medications Ordered in ED Medications  EPINEPHrine (EPI-PEN) injection 0.3 mg (has no administration in time range)  clindamycin (CLEOCIN) IVPB 600 mg (has no administration in time range)  methylPREDNISolone sodium succinate (SOLU-MEDROL) 125 mg/2 mL injection 125 mg (has no administration in time range)  diphenhydrAMINE (BENADRYL) injection 25 mg (has no administration in time range)    ED Course  I have reviewed the triage vital signs and the nursing notes.  Pertinent labs & imaging results that were available during my care of the patient were reviewed by me and considered in my medical decision making (see chart for details).    MDM Rules/Calculators/A&P  Patient presenting with a 2-day history of swelling of his lower lip.  I believe this to be caused by angioedema related to his ACE inhibitor, but he also has multiple decayed teeth and gingivitis to his lower teeth raising the concern for infection.  Patient given IV clindamycin along with Solu-Medrol and Benadryl.  He has been observed for several hours and the swelling does not seem to be progressing.  He is having no airway issues and no difficulty swallowing.  I feel as though patient can safely be discharged with clindamycin for  possible infection and prednisone/Benadryl for possible angioedema.  I will also have him hold his enalapril and monitor his blood pressures at home.  Final Clinical Impression(s) / ED Diagnoses Final diagnoses:  None    Rx / DC Orders ED Discharge Orders     None        Veryl Speak, MD 03/08/21 0126

## 2021-03-13 ENCOUNTER — Other Ambulatory Visit: Payer: Self-pay | Admitting: *Deleted

## 2021-03-13 NOTE — Patient Outreach (Signed)
[facility]) Care Management  03/13/2021  DEWELL MONNIER 07-25-1955 [id number]   Outreach attempt #3, unsuccessful to sister, HIPAA compliant voice message left.  Will make 4th and final attempt within the next 4 weeks, if remain unsuccessful will close case due to inability to maintain contact.  [name], RN, MSN, [place] Manager [phone]

## 2021-03-21 DIAGNOSIS — M2550 Pain in unspecified joint: Secondary | ICD-10-CM | POA: Diagnosis not present

## 2021-03-21 DIAGNOSIS — M1990 Unspecified osteoarthritis, unspecified site: Secondary | ICD-10-CM | POA: Diagnosis not present

## 2021-03-21 DIAGNOSIS — E7800 Pure hypercholesterolemia, unspecified: Secondary | ICD-10-CM | POA: Diagnosis not present

## 2021-03-21 DIAGNOSIS — J449 Chronic obstructive pulmonary disease, unspecified: Secondary | ICD-10-CM | POA: Diagnosis not present

## 2021-03-21 DIAGNOSIS — E559 Vitamin D deficiency, unspecified: Secondary | ICD-10-CM | POA: Diagnosis not present

## 2021-03-21 DIAGNOSIS — Z79899 Other long term (current) drug therapy: Secondary | ICD-10-CM | POA: Diagnosis not present

## 2021-03-21 DIAGNOSIS — K2100 Gastro-esophageal reflux disease with esophagitis, without bleeding: Secondary | ICD-10-CM | POA: Diagnosis not present

## 2021-03-21 DIAGNOSIS — K267 Chronic duodenal ulcer without hemorrhage or perforation: Secondary | ICD-10-CM | POA: Diagnosis not present

## 2021-03-21 DIAGNOSIS — R3129 Other microscopic hematuria: Secondary | ICD-10-CM | POA: Diagnosis not present

## 2021-03-21 DIAGNOSIS — N401 Enlarged prostate with lower urinary tract symptoms: Secondary | ICD-10-CM | POA: Diagnosis not present

## 2021-03-21 DIAGNOSIS — I119 Hypertensive heart disease without heart failure: Secondary | ICD-10-CM | POA: Diagnosis not present

## 2021-03-21 DIAGNOSIS — F319 Bipolar disorder, unspecified: Secondary | ICD-10-CM | POA: Diagnosis not present

## 2021-04-10 ENCOUNTER — Other Ambulatory Visit: Payer: Self-pay | Admitting: *Deleted

## 2021-04-10 NOTE — Patient Outreach (Signed)
[facility]) Care Management  [place]  04/10/2021   Blake Mcknight 1955-10-10 [id number]   Outreach attempt #4, successful to sister.  Denies any urgent concerns, encouraged to contact this care manager with questions.    Encounter Medications:  Outpatient Encounter Medications as of 04/10/2021  Medication Sig   albuterol (PROVENTIL) (2.5 MG/3ML) 0.083% nebulizer solution Take 2.5 mg by nebulization every 4 (four) hours.   albuterol (VENTOLIN HFA) 108 (90 Base) MCG/ACT inhaler Inhale 2 puffs into the lungs every 4 (four) hours as needed for wheezing or shortness of breath.   Ascorbic Acid (VITAMIN C PO) Take 1 tablet by mouth 3 (three) times a week.    atorvastatin (LIPITOR) 10 MG tablet Take 10 mg by mouth daily.   benzonatate (TESSALON) 200 MG capsule Take 1 capsule (200 mg total) by mouth 2 (two) times daily as needed for cough.   benztropine (COGENTIN) 2 MG tablet Take 1 tablet (2 mg total) by mouth 2 (two) times daily.   BREZTRI AEROSPHERE 160-9-4.8 MCG/ACT AERO Inhale 2 puffs into the lungs in the morning and at bedtime.   carvedilol (COREG) 3.125 MG tablet Take 1 tablet (3.125 mg total) by mouth 2 (two) times daily with a meal. (Patient taking differently: No sig reported)   cetirizine (ZYRTEC) 10 MG tablet Take 10 mg by mouth daily as needed for allergies or rhinitis.   cholecalciferol (VITAMIN D) 25 MCG (1000 UNIT) tablet Take 4,000 Units by mouth every evening.    clindamycin (CLEOCIN) 300 MG capsule Take 1 capsule (300 mg total) by mouth 4 (four) times daily. X 7 days   Cyanocobalamin (B-12 PO) Take 1 tablet by mouth every other day.   enalapril (VASOTEC) 10 MG tablet Take 10 mg by mouth daily.   famotidine (PEPCID) 40 MG tablet Take 40 mg by mouth 2 (two) times daily.   guaiFENesin-dextromethorphan (ROBITUSSIN DM) 100-10 MG/5ML syrup Take 10 mLs by mouth every 4 (four) hours as needed for cough.   HYDROCODONE-CHLORPHENIRAMINE PO Take 5 mLs by  mouth every 6 (six) hours as needed (cough and pain).   ipratropium (ATROVENT) 0.02 % nebulizer solution Take 0.5 mg by nebulization every 4 (four) hours.   mirtazapine (REMERON) 30 MG tablet Take 1 tablet (30 mg total) by mouth at bedtime.   montelukast (SINGULAIR) 10 MG tablet Take 1 tablet (10 mg total) by mouth daily. (Patient not taking: No sig reported)   multivitamin (ONE-A-DAY MEN'S) TABS tablet Take 1 tablet by mouth daily.   OXYGEN Inhale 2 L/min into the lungs continuous.    polyethylene glycol (MIRALAX / GLYCOLAX) 17 g packet Take 17 g by mouth daily as needed for mild constipation.   predniSONE (DELTASONE) 10 MG tablet Take 2 tablets (20 mg total) by mouth 2 (two) times daily with a meal.   risperiDONE (RISPERDAL) 3 MG tablet Take 1 tablet (3 mg total) by mouth 2 (two) times daily.   sodium chloride (OCEAN) 0.65 % SOLN nasal spray Place 1 spray into both nostrils as needed for congestion.   tamsulosin (FLOMAX) 0.4 MG CAPS capsule Take 0.4 mg by mouth at bedtime.   No facility-administered encounter medications on file as of 04/10/2021.    Functional Status:  In your present state of health, do you have any difficulty performing the following activities: 11/20/2020 08/03/2020  Hearing? N -  Vision? N -  Difficulty concentrating or making decisions? N -  Walking or climbing stairs? Y -  Dressing or bathing?  N -  Doing errands, shopping? Y Y  Some recent data might be hidden    Fall/Depression Screening: Fall Risk  08/07/2020 01/06/2020 07/08/2019  Falls in the past year? 0 1 0  Number falls in past yr: 0 0 0  Injury with Fall? 0 0 0  Follow up Falls evaluation completed - -   PHQ 2/9 Scores 08/07/2020 07/08/2019  Exception Documentation Medical reason Medical reason    Assessment:   Care Plan Care Plan : [facility] Plan of Care (Adult)  Updates made by [name], RN since 04/10/2021 12:00 AM     Problem: Difficulty with management of chronic medical conditions, HTN and COPD    Priority: High     Long-Range Goal: Memer and family will verbalize adequate plan of care to manage chronic health conditions, HTN and COPD   Start Date: 02/01/2021  Expected End Date: 07/31/2021  This Visit's Progress: On track  Priority: High  Note:   Current Barriers:  Chronic Disease Management support and education needs related to HTN and COPD  RNCM Clinical Goal(s):  Patient will verbalize understanding of plan for management of HTN and COPD take all medications exactly as prescribed and will call provider for medication related questions continue to work with RN Care Manager to address care management and care coordination needs related to  HTN and COPD not experience hospital admission. Hospital Admissions in last 6 months = 3  through collaboration with RN Care manager, provider, and care team.   Interventions: Inter-disciplinary care team collaboration (see longitudinal plan of care) Evaluation of current treatment plan related to  self management and patient's adherence to plan as established by provider  Hypertension Interventions: Last practice recorded BP readings:  BP Readings from Last 3 Encounters:  11/21/20 (!) 152/96  09/05/20 (!) 151/81  08/04/20 (!) 141/85  Most recent eGFR/CrCl: No results found for: EGFR  No components found for: CRCL  Evaluation of current treatment plan related to hypertension self management and patient's adherence to plan as established by provider; Provided education to patient re: stroke prevention, s/s of heart attack and stroke; Discussed plans with patient for ongoing care management follow up and provided patient with direct contact information for care management team; Advised patient, providing education and rationale, to monitor blood pressure daily and record, calling PCP for findings outside established parameters;   COPD Interventions:   Provided patient with basic written and verbal COPD education on self  care/management/and exacerbation prevention; Advised patient to track and manage COPD triggers;  Provided instruction about proper use of medications used for management of COPD including inhalers; Advised patient to self assesses COPD action plan zone and make appointment with provider if in the yellow zone for 48 hours without improvement;   Update 11/18 - Sister report member has been doing well over the last several weeks.  He is using inhalers and nebulizers as indicated.  Was seen by PCP, no changes made in medications.  Discussed upcoming holidays and keeping member safe from sick contacts, she verbalizes understanding.    Patient Goals/Self-Care Activities: Patient will self administer medications as prescribed Patient will attend all scheduled provider appointments Patient will continue to perform ADL's independently  Follow Up Plan:  Telephone follow up appointment with care management team member scheduled for:  2/21 The patient has been provided with contact information for the care management team and has been advised to call with any health related questions or concerns.    Update 1/25 - Spoke with  sister, state member is "ok."  He was seen in the ED on 12/22 due to lip swelling, diagnosed with allergic reaction.  She feel it may be more related to dental work the member needs.  She reports he has had low grade temps over the last week as well.  Dentist appointment scheduled for tomorrow.  Denies any issues with breathing, no chest discomfort, taking medications as prescribed.           Plan:  Follow-up: Patient agrees to Care Plan and Follow-up. Follow-up in 1 month(s).  [name], RN, MSN, [place] Manager [phone]

## 2021-04-25 DIAGNOSIS — N401 Enlarged prostate with lower urinary tract symptoms: Secondary | ICD-10-CM | POA: Diagnosis not present

## 2021-04-25 DIAGNOSIS — R3912 Poor urinary stream: Secondary | ICD-10-CM | POA: Diagnosis not present

## 2021-04-25 DIAGNOSIS — R310 Gross hematuria: Secondary | ICD-10-CM | POA: Diagnosis not present

## 2021-04-25 DIAGNOSIS — R300 Dysuria: Secondary | ICD-10-CM | POA: Diagnosis not present

## 2021-04-25 DIAGNOSIS — R351 Nocturia: Secondary | ICD-10-CM | POA: Diagnosis not present

## 2021-04-25 DIAGNOSIS — R350 Frequency of micturition: Secondary | ICD-10-CM | POA: Diagnosis not present

## 2021-04-30 DIAGNOSIS — K2100 Gastro-esophageal reflux disease with esophagitis, without bleeding: Secondary | ICD-10-CM | POA: Diagnosis not present

## 2021-04-30 DIAGNOSIS — I119 Hypertensive heart disease without heart failure: Secondary | ICD-10-CM | POA: Diagnosis not present

## 2021-04-30 DIAGNOSIS — M1990 Unspecified osteoarthritis, unspecified site: Secondary | ICD-10-CM | POA: Diagnosis not present

## 2021-04-30 DIAGNOSIS — E7800 Pure hypercholesterolemia, unspecified: Secondary | ICD-10-CM | POA: Diagnosis not present

## 2021-04-30 DIAGNOSIS — E559 Vitamin D deficiency, unspecified: Secondary | ICD-10-CM | POA: Diagnosis not present

## 2021-04-30 DIAGNOSIS — J449 Chronic obstructive pulmonary disease, unspecified: Secondary | ICD-10-CM | POA: Diagnosis not present

## 2021-04-30 DIAGNOSIS — K267 Chronic duodenal ulcer without hemorrhage or perforation: Secondary | ICD-10-CM | POA: Diagnosis not present

## 2021-04-30 DIAGNOSIS — N401 Enlarged prostate with lower urinary tract symptoms: Secondary | ICD-10-CM | POA: Diagnosis not present

## 2021-04-30 DIAGNOSIS — Z79899 Other long term (current) drug therapy: Secondary | ICD-10-CM | POA: Diagnosis not present

## 2021-04-30 DIAGNOSIS — M2550 Pain in unspecified joint: Secondary | ICD-10-CM | POA: Diagnosis not present

## 2021-04-30 DIAGNOSIS — F319 Bipolar disorder, unspecified: Secondary | ICD-10-CM | POA: Diagnosis not present

## 2021-04-30 DIAGNOSIS — R3129 Other microscopic hematuria: Secondary | ICD-10-CM | POA: Diagnosis not present

## 2021-05-07 ENCOUNTER — Other Ambulatory Visit: Payer: Self-pay | Admitting: *Deleted

## 2021-05-07 NOTE — Patient Outreach (Addendum)
[facility]) Care Management Telephonic RN Care Manager Note   05/07/2021 Name:  Blake Mcknight MRN:  [id number] DOB:  05-25-1955  Summary: Blake Mcknight call placed to member's sister, successful.  Denies any urgent concerns, encouraged to contact this care manager with questions.    Subjective: ODAI Mcknight is an 66 y.o. year old male who is a primary patient of [name], MD. The care management team was consulted for assistance with care management and/or care coordination needs.    Telephonic RN Care Manager completed Telephone Visit today.  Objective:   Medications Reviewed Today     Reviewed by Kathlee Nations, MD (Psychiatrist) on 01/23/21 at 1434  Med List Status: <None>   Medication Order Taking? Sig Documenting Provider Last Dose Status Informant  albuterol (PROVENTIL) (2.5 MG/3ML) 0.083% nebulizer solution [id number] No Take 2.5 mg by nebulization every 4 (four) hours. [provider] 11/19/2020 Active Family Member  albuterol (VENTOLIN HFA) 108 (90 Base) MCG/ACT inhaler [id number] No Inhale 2 puffs into the lungs every 4 (four) hours as needed for wheezing or shortness of breath. Aline August, MD 11/19/2020 Active Family Member  Ascorbic Acid (VITAMIN C PO) [id number] No Take 1 tablet by mouth 3 (three) times a week.  [provider] Past Week Active Family Member  atorvastatin (LIPITOR) 10 MG tablet [id number] No Take 10 mg by mouth daily. [provider] 11/19/2020 Active Family Member           Med Note [facility], GREG A   Sat Sep 22, 2015 12:12 AM)    benzonatate (TESSALON) 200 MG capsule [id number] No Take 1 capsule (200 mg total) by mouth 2 (two) times daily as needed for cough. Mariel Aloe, MD 11/19/2020 Active Family Member  benztropine (COGENTIN) 2 MG tablet [id number] No Take 1 tablet (2 mg total) by mouth 2 (two) times daily. Kathlee Nations, MD 11/19/2020 Active Family Member  BREZTRI AEROSPHERE 160-9-4.8 MCG/ACT AERO  [id number] No Inhale 2 puffs into the lungs in the morning and at bedtime. [provider] 11/19/2020 Active Family Member  carvedilol (COREG) 3.125 MG tablet [id number] No Take 1 tablet (3.125 mg total) by mouth 2 (two) times daily with a meal.  Patient taking differently: No sig reported   Aline August, MD 11/19/2020 2200 Active Family Member  cetirizine (ZYRTEC) 10 MG tablet [id number] No Take 10 mg by mouth daily as needed for allergies or rhinitis. [provider] unk Active Family Member  cholecalciferol (VITAMIN D) 25 MCG (1000 UNIT) tablet [id number] No Take 4,000 Units by mouth every evening.  [provider] 11/16/2020 Active Family Member  Cyanocobalamin (B-12 PO) [id number] No Take 1 tablet by mouth every other day. [provider] Past Week Active Family Member  enalapril (VASOTEC) 10 MG tablet [id number] No Take 10 mg by mouth daily. [provider] 11/19/2020 Active Family Member  famotidine (PEPCID) 40 MG tablet [id number] No Take 40 mg by mouth 2 (two) times daily. [provider] 11/19/2020 Active Family Member  guaiFENesin-dextromethorphan [facility] DM) 100-10 MG/5ML syrup [id number] No Take 10 mLs by mouth every 4 (four) hours as needed for cough. Aline August, MD unk Active Family Member  HYDROCODONE-CHLORPHENIRAMINE PO [id number] No Take 5 mLs by mouth every 6 (six) hours as needed (cough and pain). [provider] unk Active Family Member  ipratropium (ATROVENT) 0.02 % nebulizer solution [id number] No Take 0.5 mg by nebulization every 4 (four) hours. [provider] 11/19/2020 Active Family Member  mirtazapine (  REMERON) 30 MG tablet [id number] No Take 1 tablet (30 mg total) by mouth at bedtime. Kathlee Nations, MD 11/19/2020 Active Family Member  montelukast (SINGULAIR) 10 MG tablet [id number] No Take 1 tablet (10 mg total) by mouth daily.  Patient not taking: No sig reported   Harvie Heck, MD Not Taking Expired 09/21/19 2359  Family Member  multivitamin (ONE-A-DAY MEN'S) TABS tablet [id number] No Take 1 tablet by mouth daily. [provider] Past Week Active Family Member  OXYGEN [id number] No Inhale 2 L/min into the lungs continuous.  [provider] 09/02/2020 Active Family Member  polyethylene glycol (MIRALAX / GLYCOLAX) 17 g packet [id number] No Take 17 g by mouth daily as needed for mild constipation. [provider] Past Month Active Family Member  predniSONE (DELTASONE) 10 MG tablet [id number]  Take 4 tablets (40 mg) daily for 2 days, then, Take 3 tablets (30 mg) daily for 2 days, then, Take 2 tablets (20 mg) daily for 2 days, then, Take 1 tablets (10 mg) daily for 1 days, then stop Pokhrel, Corrie Mckusick, MD  Active   risperiDONE (RISPERDAL) 3 MG tablet [id number] No Take 1 tablet (3 mg total) by mouth 2 (two) times daily. Kathlee Nations, MD 11/19/2020 Active Family Member  sodium chloride (OCEAN) 0.65 % SOLN nasal spray [id number] No Place 1 spray into both nostrils as needed for congestion. [provider] Past Week Active Family Member  tamsulosin (FLOMAX) 0.4 MG CAPS capsule [id number] No Take 0.4 mg by mouth at bedtime. [provider] 11/19/2020 Active Family Member             SDOH:  (Social Determinants of Health) assessments and interventions performed:     Care Plan  Review of patient past medical history, allergies, medications, health status, including review of consultants reports, laboratory and other test data, was performed as part of comprehensive evaluation for care management services.   Care Plan : [facility] Plan of Care (Adult)  Updates made by [name], RN since 05/07/2021 12:00 AM     Problem: Difficulty with management of chronic medical conditions, HTN and COPD   Priority: High     Long-Range Goal: Memer and family will verbalize adequate plan of care to manage chronic health conditions, HTN and COPD   Start Date: 02/01/2021  Expected End Date:  07/31/2021  This Visit's Progress: On track  Recent Progress: On track  Priority: High  Note:   Current Barriers:  Chronic Disease Management support and education needs related to HTN and COPD  RNCM Clinical Goal(s):  Patient will verbalize understanding of plan for management of HTN and COPD as evidenced by sister verbalizing adequate plan of care and COPD zones take all medications exactly as prescribed and will call provider for medication related questions as evidenced by Sister reporting adherence continue to work with RN Care Manager to address care management and care coordination needs related to  HTN and COPD as evidenced by adherence to CM Team Scheduled appointments through collaboration with RN Care manager, provider, and care team.   Interventions: Inter-disciplinary care team collaboration (see longitudinal plan of care) Evaluation of current treatment plan related to  self management and patient's adherence to plan as established by provider  Hypertension Interventions: Last practice recorded BP readings:  BP Readings from Last 3 Encounters:  03/08/21 138/81  11/21/20 (!) 152/96  09/05/20 (!) 151/81  Most recent eGFR/CrCl: No results found for: EGFR  No components found for: CRCL  Evaluation of current treatment  plan related to hypertension self management and patient's adherence to plan as established by provider; Provided education to patient re: stroke prevention, s/s of heart attack and stroke; Discussed plans with patient for ongoing care management follow up and provided patient with direct contact information for care management team; Advised patient, providing education and rationale, to monitor blood pressure daily and record, calling PCP for findings outside established parameters;   COPD Interventions:   Provided patient with basic written and verbal COPD education on self care/management/and exacerbation prevention; Advised patient to track and manage COPD  triggers;  Provided instruction about proper use of medications used for management of COPD including inhalers; Advised patient to self assesses COPD action plan zone and make appointment with provider if in the yellow zone for 48 hours without improvement;   Update 11/18 - Sister report member has been doing well over the last several weeks.  He is using inhalers and nebulizers as indicated.  Was seen by PCP, no changes made in medications.  Discussed upcoming holidays and keeping member safe from sick contacts, she verbalizes understanding.    Patient Goals/Self-Care Activities: Patient will self administer medications as prescribed Patient will attend all scheduled provider appointments Patient will continue to perform ADL's independently  Follow Up Plan:  Telephone follow up appointment with care management team member scheduled for:  3/21 The patient has been provided with contact information for the care management team and has been advised to call with any health related questions or concerns.    Update 1/25 - Spoke with sister, state member is "ok."  He was seen in the ED on 12/22 due to lip swelling, diagnosed with allergic reaction.  She feel it may be more related to dental work the member needs.  She reports he has had low grade temps over the last week as well.  Dentist appointment scheduled for tomorrow.  Denies any issues with breathing, no chest discomfort, taking medications as prescribed.     Update 2/21 - Sister report member has done well over the last several months with managing COPD.  State he still has some "bad days" in which he is using his inhalers and nebulizers for relief.  Denies member has had cough or shortness of breath lately.  Blood pressure has remained stable. He was seen by urologist on 2/9 due to intermittent bloody urine, was told that prostate was enlarged.  He will have a CT and cystoscopy in the next couple weeks as well as follow up visit with urology.   Tamsulosin was increased, sister report member is taking all medications as prescribed.        Plan:  Telephone follow up appointment with care management team member scheduled for:  1 month The patient has been provided with contact information for the care management team and has been advised to call with any health related questions or concerns.   [name], RN, MSN, [place] Manager [phone]

## 2021-05-22 DIAGNOSIS — R351 Nocturia: Secondary | ICD-10-CM | POA: Diagnosis not present

## 2021-05-22 DIAGNOSIS — N401 Enlarged prostate with lower urinary tract symptoms: Secondary | ICD-10-CM | POA: Diagnosis not present

## 2021-05-22 DIAGNOSIS — R310 Gross hematuria: Secondary | ICD-10-CM | POA: Diagnosis not present

## 2021-05-22 DIAGNOSIS — K760 Fatty (change of) liver, not elsewhere classified: Secondary | ICD-10-CM | POA: Diagnosis not present

## 2021-06-04 ENCOUNTER — Other Ambulatory Visit: Payer: Self-pay | Admitting: *Deleted

## 2021-06-04 NOTE — Patient Outreach (Signed)
?[place] [facility]) Care Management ?Telephonic RN Care Manager Note ? ? ?06/04/2021 ?Name:  MAXTEN SHULER MRN:  [id number] DOB:  11-02-1955 ? ?Summary: ?Outgoing call placed to sister, successful.  Denies any urgent concerns, encouraged to contact this care manager with questions.   ? ? ?Subjective: ?TAISHAUN LEVELS is an 66 y.o. year old male who is a primary patient of [name], MD. The care management team was consulted for assistance with care management and/or care coordination needs.   ? ?Telephonic RN Care Manager completed Telephone Visit today. ? ?Objective:  ? ?Medications Reviewed Today   ? ? Reviewed by Kathlee Nations, MD (Psychiatrist) on 01/23/21 at 1434  Med List Status: <None>  ? ?Medication Order Taking? Sig Documenting Provider Last Dose Status Informant  ?albuterol (PROVENTIL) (2.5 MG/3ML) 0.083% nebulizer solution [id number] No Take 2.5 mg by nebulization every 4 (four) hours. [provider] 11/19/2020 Active Family Member  ?albuterol (VENTOLIN HFA) 108 (90 Base) MCG/ACT inhaler [id number] No Inhale 2 puffs into the lungs every 4 (four) hours as needed for wheezing or shortness of breath. Aline August, MD 11/19/2020 Active Family Member  ?Ascorbic Acid (VITAMIN C PO) [id number] No Take 1 tablet by mouth 3 (three) times a week.  [provider] Past Week Active Family Member  ?atorvastatin (LIPITOR) 10 MG tablet [id number] No Take 10 mg by mouth daily. [provider] 11/19/2020 Active Family Member  ?         ?Med Note [facility], GREG A   Sat Sep 22, 2015 12:12 AM)    ?benzonatate (TESSALON) 200 MG capsule [id number] No Take 1 capsule (200 mg total) by mouth 2 (two) times daily as needed for cough. Mariel Aloe, MD 11/19/2020 Active Family Member  ?benztropine (COGENTIN) 2 MG tablet [id number] No Take 1 tablet (2 mg total) by mouth 2 (two) times daily. Kathlee Nations, MD 11/19/2020 Active Family Member  ?BREZTRI AEROSPHERE 160-9-4.8 MCG/ACT AERO  [id number] No Inhale 2 puffs into the lungs in the morning and at bedtime. [provider] 11/19/2020 Active Family Member  ?carvedilol (COREG) 3.125 MG tablet [id number] No Take 1 tablet (3.125 mg total) by mouth 2 (two) times daily with a meal.  ?Patient taking differently: No sig reported  ? Aline August, MD 11/19/2020 2200 Active Family Member  ?cetirizine (ZYRTEC) 10 MG tablet [id number] No Take 10 mg by mouth daily as needed for allergies or rhinitis. [provider] unk Active Family Member  ?cholecalciferol (VITAMIN D) 25 MCG (1000 UNIT) tablet [id number] No Take 4,000 Units by mouth every evening.  [provider] 11/16/2020 Active Family Member  ?Cyanocobalamin (B-12 PO) [id number] No Take 1 tablet by mouth every other day. [provider] Past Week Active Family Member  ?enalapril (VASOTEC) 10 MG tablet [id number] No Take 10 mg by mouth daily. [provider] 11/19/2020 Active Family Member  ?famotidine (PEPCID) 40 MG tablet [id number] No Take 40 mg by mouth 2 (two) times daily. [provider] 11/19/2020 Active Family Member  ?guaiFENesin-dextromethorphan (ROBITUSSIN DM) 100-10 MG/5ML syrup [id number] No Take 10 mLs by mouth every 4 (four) hours as needed for cough. Aline August, MD unk Active Family Member  ?HYDROCODONE-CHLORPHENIRAMINE PO [id number] No Take 5 mLs by mouth every 6 (six) hours as needed (cough and pain). [provider] unk Active Family Member  ?ipratropium (ATROVENT) 0.02 % nebulizer solution [id number] No Take 0.5 mg by nebulization every 4 (four) hours. [provider] 11/19/2020 Active Family Member  ?mirtazapine (  REMERON) 30 MG tablet [id number] No Take 1 tablet (30 mg total) by mouth at bedtime. Kathlee Nations, MD 11/19/2020 Active Family Member  ?montelukast (SINGULAIR) 10 MG tablet [id number] No Take 1 tablet (10 mg total) by mouth daily.  ?Patient not taking: No sig reported  ? Harvie Heck, MD Not Taking Expired 09/21/19 2359  Family Member  ?multivitamin (ONE-A-DAY MEN'S) TABS tablet [id number] No Take 1 tablet by mouth daily. [provider] Past Week Active Family Member  ?OXYGEN [id number] No Inhale 2 L/min into the lungs continuous.  [provider] 09/02/2020 Active Family Member  ?polyethylene glycol (MIRALAX / GLYCOLAX) 17 g packet [id number] No Take 17 g by mouth daily as needed for mild constipation. [provider] Past Month Active Family Member  ?predniSONE (DELTASONE) 10 MG tablet [id number]  Take 4 tablets (40 mg) daily for 2 days, then, Take 3 tablets (30 mg) daily for 2 days, then, Take 2 tablets (20 mg) daily for 2 days, then, Take 1 tablets (10 mg) daily for 1 days, then stop Pokhrel, Corrie Mckusick, MD  Active   ?risperiDONE (RISPERDAL) 3 MG tablet [id number] No Take 1 tablet (3 mg total) by mouth 2 (two) times daily. Kathlee Nations, MD 11/19/2020 Active Family Member  ?sodium chloride (OCEAN) 0.65 % SOLN nasal spray [id number] No Place 1 spray into both nostrils as needed for congestion. [provider] Past Week Active Family Member  ?tamsulosin (FLOMAX) 0.4 MG CAPS capsule [id number] No Take 0.4 mg by mouth at bedtime. [provider] 11/19/2020 Active Family Member  ? ?  ?  ? ?  ? ? ? ?SDOH:  (Social Determinants of Health) assessments and interventions performed:  ? ? ? ?Care Plan ? ?Review of patient past medical history, allergies, medications, health status, including review of consultants reports, laboratory and other test data, was performed as part of comprehensive evaluation for care management services.  ? ?Care Plan : [facility] Plan of Care (Adult)  ?Updates made by [name], RN since 06/04/2021 12:00 AM  ?  ? ?Problem: Difficulty with management of chronic medical conditions, HTN and COPD   ?Priority: High  ?  ? ?Long-Range Goal: Memer and family will verbalize adequate plan of care to manage chronic health conditions, HTN and COPD   ?Start Date: 02/01/2021  ?Expected End Date:  07/31/2021  ?This Visit's Progress: On track  ?Recent Progress: On track  ?Priority: High  ?Note:   ?Current Barriers:  ?Chronic Disease Management support and education needs related to HTN and COPD ? ?RNCM Clinical Goal(s):  ?Patient will verbalize understanding of plan for management of HTN and COPD as evidenced by sister verbalizing adequate plan of care and COPD zones ?take all medications exactly as prescribed and will call provider for medication related questions as evidenced by Sister reporting adherence ?continue to work with RN Care Manager to address care management and care coordination needs related to  HTN and COPD as evidenced by adherence to CM Team Scheduled appointments through collaboration with RN Care manager, provider, and care team.  ? ?Interventions: ?Inter-disciplinary care team collaboration (see longitudinal plan of care) ?Evaluation of current treatment plan related to  self management and patient's adherence to plan as established by provider ? ?Hypertension Interventions: ?Last practice recorded BP readings:  ?BP Readings from Last 3 Encounters:  ?03/08/21 138/81  ?11/21/20 (!) 152/96  ?09/05/20 (!) 151/81  ?Most recent eGFR/CrCl: No results found for: EGFR  No components found for: CRCL ? ?Evaluation of current treatment  plan related to hypertension self management and patient's adherence to plan as established by provider; ?Provided education to patient re: stroke prevention, s/s of heart attack and stroke; ?Discussed plans with patient for ongoing care management follow up and provided patient with direct contact information for care management team; ?Advised patient, providing education and rationale, to monitor blood pressure daily and record, calling PCP for findings outside established parameters;  ? ?COPD Interventions:   ?Provided patient with basic written and verbal COPD education on self care/management/and exacerbation prevention; ?Advised patient to track and manage COPD  triggers;  ?Provided instruction about proper use of medications used for management of COPD including inhalers; ?Advised patient to self assesses COPD action plan zone and make appointment with provider if in t

## 2021-06-05 DIAGNOSIS — R911 Solitary pulmonary nodule: Secondary | ICD-10-CM | POA: Diagnosis not present

## 2021-06-05 DIAGNOSIS — D381 Neoplasm of uncertain behavior of trachea, bronchus and lung: Secondary | ICD-10-CM | POA: Diagnosis not present

## 2021-06-05 DIAGNOSIS — J439 Emphysema, unspecified: Secondary | ICD-10-CM | POA: Diagnosis not present

## 2021-06-23 DIAGNOSIS — J189 Pneumonia, unspecified organism: Secondary | ICD-10-CM | POA: Diagnosis not present

## 2021-06-23 DIAGNOSIS — R7302 Impaired glucose tolerance (oral): Secondary | ICD-10-CM | POA: Diagnosis not present

## 2021-06-23 DIAGNOSIS — I119 Hypertensive heart disease without heart failure: Secondary | ICD-10-CM | POA: Diagnosis not present

## 2021-06-23 DIAGNOSIS — N401 Enlarged prostate with lower urinary tract symptoms: Secondary | ICD-10-CM | POA: Diagnosis not present

## 2021-06-23 DIAGNOSIS — K5900 Constipation, unspecified: Secondary | ICD-10-CM | POA: Diagnosis not present

## 2021-06-23 DIAGNOSIS — K2100 Gastro-esophageal reflux disease with esophagitis, without bleeding: Secondary | ICD-10-CM | POA: Diagnosis not present

## 2021-06-23 DIAGNOSIS — Z79899 Other long term (current) drug therapy: Secondary | ICD-10-CM | POA: Diagnosis not present

## 2021-06-23 DIAGNOSIS — K267 Chronic duodenal ulcer without hemorrhage or perforation: Secondary | ICD-10-CM | POA: Diagnosis not present

## 2021-06-23 DIAGNOSIS — J441 Chronic obstructive pulmonary disease with (acute) exacerbation: Secondary | ICD-10-CM | POA: Diagnosis not present

## 2021-06-23 DIAGNOSIS — F319 Bipolar disorder, unspecified: Secondary | ICD-10-CM | POA: Diagnosis not present

## 2021-06-23 DIAGNOSIS — E559 Vitamin D deficiency, unspecified: Secondary | ICD-10-CM | POA: Diagnosis not present

## 2021-06-23 DIAGNOSIS — E7800 Pure hypercholesterolemia, unspecified: Secondary | ICD-10-CM | POA: Diagnosis not present

## 2021-06-26 ENCOUNTER — Other Ambulatory Visit (HOSPITAL_COMMUNITY): Payer: Self-pay | Admitting: Psychiatry

## 2021-06-26 DIAGNOSIS — F200 Paranoid schizophrenia: Secondary | ICD-10-CM

## 2021-07-02 ENCOUNTER — Other Ambulatory Visit: Payer: Self-pay | Admitting: *Deleted

## 2021-07-02 NOTE — Patient Outreach (Signed)
?[place] [facility]) Care Management ?Telephonic RN Care Manager Note ? ? ?07/02/2021 ?Name:  Blake Mcknight MRN:  [id number] DOB:  1955/07/03 ? ?Summary: ?Outgoing call placed to member's sister, successful.  Denies any urgent concerns, encouraged to contact this care manager with questions.   ? ?Subjective: ?Blake Mcknight is an 66 y.o. year old male who is a primary patient of Blake Liberty, MD. The care management team was consulted for assistance with care management and/or care coordination needs.   ? ?Telephonic RN Care Manager completed Telephone Visit today. ? ?Objective:  ? ?Medications Reviewed Today   ? ? Reviewed by Kathlee Nations, MD (Psychiatrist) on 01/23/21 at 1434  Med List Status: <None>  ? ?Medication Order Taking? Sig Documenting Provider Last Dose Status Informant  ?albuterol (PROVENTIL) (2.5 MG/3ML) 0.083% nebulizer solution [id number] No Take 2.5 mg by nebulization every 4 (four) hours. [provider] 11/19/2020 Active Family Member  ?albuterol (VENTOLIN HFA) 108 (90 Base) MCG/ACT inhaler [id number] No Inhale 2 puffs into the lungs every 4 (four) hours as needed for wheezing or shortness of breath. Aline August, MD 11/19/2020 Active Family Member  ?Ascorbic Acid (VITAMIN C PO) [id number] No Take 1 tablet by mouth 3 (three) times a week.  [provider] Past Week Active Family Member  ?atorvastatin (LIPITOR) 10 MG tablet [id number] No Take 10 mg by mouth daily. [provider] 11/19/2020 Active Family Member  ?         ?Med Note [facility], GREG A   Sat Sep 22, 2015 12:12 AM)    ?benzonatate (TESSALON) 200 MG capsule [id number] No Take 1 capsule (200 mg total) by mouth 2 (two) times daily as needed for cough. Mariel Aloe, MD 11/19/2020 Active Family Member  ?benztropine (COGENTIN) 2 MG tablet [id number] No Take 1 tablet (2 mg total) by mouth 2 (two) times daily. Kathlee Nations, MD 11/19/2020 Active Family Member  ?BREZTRI AEROSPHERE 160-9-4.8 MCG/ACT AERO  [id number] No Inhale 2 puffs into the lungs in the morning and at bedtime. [provider] 11/19/2020 Active Family Member  ?carvedilol (COREG) 3.125 MG tablet [id number] No Take 1 tablet (3.125 mg total) by mouth 2 (two) times daily with a meal.  ?Patient taking differently: No sig reported  ? Aline August, MD 11/19/2020 2200 Active Family Member  ?cetirizine (ZYRTEC) 10 MG tablet [id number] No Take 10 mg by mouth daily as needed for allergies or rhinitis. [provider] unk Active Family Member  ?cholecalciferol (VITAMIN D) 25 MCG (1000 UNIT) tablet [id number] No Take 4,000 Units by mouth every evening.  [provider] 11/16/2020 Active Family Member  ?Cyanocobalamin (B-12 PO) [id number] No Take 1 tablet by mouth every other day. [provider] Past Week Active Family Member  ?enalapril (VASOTEC) 10 MG tablet [id number] No Take 10 mg by mouth daily. [provider] 11/19/2020 Active Family Member  ?famotidine (PEPCID) 40 MG tablet [id number] No Take 40 mg by mouth 2 (two) times daily. [provider] 11/19/2020 Active Family Member  ?guaiFENesin-dextromethorphan (ROBITUSSIN DM) 100-10 MG/5ML syrup [id number] No Take 10 mLs by mouth every 4 (four) hours as needed for cough. Aline August, MD unk Active Family Member  ?HYDROCODONE-CHLORPHENIRAMINE PO [id number] No Take 5 mLs by mouth every 6 (six) hours as needed (cough and pain). [provider] unk Active Family Member  ?ipratropium (ATROVENT) 0.02 % nebulizer solution [id number] No Take 0.5 mg by nebulization every 4 (four) hours. [provider] 11/19/2020 Active Family Member  ?mirtazapine (  REMERON) 30 MG tablet [id number] No Take 1 tablet (30 mg total) by mouth at bedtime. Kathlee Nations, MD 11/19/2020 Active Family Member  ?montelukast (SINGULAIR) 10 MG tablet [id number] No Take 1 tablet (10 mg total) by mouth daily.  ?Patient not taking: No sig reported  ? Harvie Heck, MD Not Taking Expired 09/21/19 2359  Family Member  ?multivitamin (ONE-A-DAY MEN'S) TABS tablet [id number] No Take 1 tablet by mouth daily. [provider] Past Week Active Family Member  ?OXYGEN [id number] No Inhale 2 L/min into the lungs continuous.  [provider] 09/02/2020 Active Family Member  ?polyethylene glycol (MIRALAX / GLYCOLAX) 17 g packet [id number] No Take 17 g by mouth daily as needed for mild constipation. [provider] Past Month Active Family Member  ?predniSONE (DELTASONE) 10 MG tablet [id number]  Take 4 tablets (40 mg) daily for 2 days, then, Take 3 tablets (30 mg) daily for 2 days, then, Take 2 tablets (20 mg) daily for 2 days, then, Take 1 tablets (10 mg) daily for 1 days, then stop Pokhrel, Corrie Mckusick, MD  Active   ?risperiDONE (RISPERDAL) 3 MG tablet [id number] No Take 1 tablet (3 mg total) by mouth 2 (two) times daily. Kathlee Nations, MD 11/19/2020 Active Family Member  ?sodium chloride (OCEAN) 0.65 % SOLN nasal spray [id number] No Place 1 spray into both nostrils as needed for congestion. [provider] Past Week Active Family Member  ?tamsulosin (FLOMAX) 0.4 MG CAPS capsule [id number] No Take 0.4 mg by mouth at bedtime. [provider] 11/19/2020 Active Family Member  ? ?  ?  ? ?  ? ? ? ?SDOH:  (Social Determinants of Health) assessments and interventions performed:  ? ? ? ?Care Plan ? ?Review of patient past medical history, allergies, medications, health status, including review of consultants reports, laboratory and other test data, was performed as part of comprehensive evaluation for care management services.  ? ?Care Plan : [facility] Plan of Care (Adult)  ?Updates made by [name], RN since 07/02/2021 12:00 AM  ?  ? ?Problem: Difficulty with management of chronic medical conditions, HTN and COPD   ?Priority: High  ?  ? ?Long-Range Goal: Memer and family will verbalize adequate plan of care to manage chronic health conditions, HTN and COPD   ?Start Date: 02/01/2021  ?Expected End Date:  07/31/2021  ?This Visit's Progress: On track  ?Recent Progress: On track  ?Priority: High  ?Note:   ?Current Barriers:  ?Chronic Disease Management support and education needs related to HTN and COPD ? ?RNCM Clinical Goal(s):  ?Patient will verbalize understanding of plan for management of HTN and COPD as evidenced by sister verbalizing adequate plan of care and COPD zones ?take all medications exactly as prescribed and will call provider for medication related questions as evidenced by Sister reporting adherence ?continue to work with RN Care Manager to address care management and care coordination needs related to  HTN and COPD as evidenced by adherence to CM Team Scheduled appointments through collaboration with RN Care manager, provider, and care team.  ? ?Interventions: ?Inter-disciplinary care team collaboration (see longitudinal plan of care) ?Evaluation of current treatment plan related to  self management and patient's adherence to plan as established by provider ? ?Hypertension Interventions: ?Last practice recorded BP readings:  ?BP Readings from Last 3 Encounters:  ?03/08/21 138/81  ?11/21/20 (!) 152/96  ?09/05/20 (!) 151/81  ?Most recent eGFR/CrCl: No results found for: EGFR  No components found for: CRCL ? ?Evaluation of current treatment  plan related to hypertension self management and patient's adherence to plan as established by provider; ?Provided education to patient re: stroke prevention, s/s of heart attack and stroke; ?Discussed plans with patient for ongoing care management follow up and provided patient with direct contact information for care management team; ?Advised patient, providing education and rationale, to monitor blood pressure daily and record, calling PCP for findings outside established parameters;  ? ?COPD Interventions:   ?Provided patient with basic written and verbal COPD education on self care/management/and exacerbation prevention; ?Advised patient to track and manage COPD  triggers;  ?Provided instruction about proper use of medications used for management of COPD including inhalers; ?Advised patient to self assesses COPD action plan zone and make appointment with provider

## 2021-07-09 ENCOUNTER — Other Ambulatory Visit: Payer: Self-pay

## 2021-07-09 ENCOUNTER — Emergency Department (HOSPITAL_COMMUNITY): Payer: Medicare Other

## 2021-07-09 ENCOUNTER — Encounter (HOSPITAL_COMMUNITY): Payer: Self-pay | Admitting: *Deleted

## 2021-07-09 ENCOUNTER — Emergency Department (HOSPITAL_COMMUNITY)
Admission: EM | Admit: 2021-07-09 | Discharge: 2021-07-09 | Disposition: A | Discharge status: Home / Self Care | Payer: Medicare Other | Attending: Emergency Medicine | Admitting: Emergency Medicine

## 2021-07-09 DIAGNOSIS — Z20822 Contact with and (suspected) exposure to covid-19: Secondary | ICD-10-CM | POA: Insufficient documentation

## 2021-07-09 DIAGNOSIS — J441 Chronic obstructive pulmonary disease with (acute) exacerbation: Secondary | ICD-10-CM | POA: Insufficient documentation

## 2021-07-09 DIAGNOSIS — Z79899 Other long term (current) drug therapy: Secondary | ICD-10-CM | POA: Insufficient documentation

## 2021-07-09 DIAGNOSIS — R062 Wheezing: Secondary | ICD-10-CM | POA: Diagnosis not present

## 2021-07-09 DIAGNOSIS — R0602 Shortness of breath: Secondary | ICD-10-CM | POA: Diagnosis not present

## 2021-07-09 DIAGNOSIS — R0603 Acute respiratory distress: Secondary | ICD-10-CM | POA: Diagnosis not present

## 2021-07-09 DIAGNOSIS — J439 Emphysema, unspecified: Secondary | ICD-10-CM | POA: Diagnosis not present

## 2021-07-09 DIAGNOSIS — R0902 Hypoxemia: Secondary | ICD-10-CM | POA: Diagnosis not present

## 2021-07-09 LAB — COMPREHENSIVE METABOLIC PANEL
ALT: 20 U/L (ref 0–44)
AST: 17 U/L (ref 15–41)
Albumin: 3.5 g/dL (ref 3.5–5.0)
Alkaline Phosphatase: 41 U/L (ref 38–126)
Anion gap: 9 (ref 5–15)
BUN: 5 mg/dL — ABNORMAL LOW (ref 8–23)
CO2: 26 mmol/L (ref 22–32)
Calcium: 9.2 mg/dL (ref 8.9–10.3)
Chloride: 100 mmol/L (ref 98–111)
Creatinine, Ser: 0.8 mg/dL (ref 0.61–1.24)
GFR, Estimated: >60 mL/min (ref >60)
Glucose, Bld: 105 mg/dL — ABNORMAL HIGH (ref 70–99)
Potassium: 3.7 mmol/L (ref 3.5–5.1)
Sodium: 135 mmol/L (ref 135–145)
Total Bilirubin: 0.4 mg/dL (ref 0.3–1.2)
Total Protein: 6.6 g/dL (ref 6.5–8.1)

## 2021-07-09 LAB — RESP PANEL BY RT-PCR (FLU A&B, COVID) ARPGX2
Influenza A by PCR: NEGATIVE
Influenza B by PCR: NEGATIVE
SARS Coronavirus 2 by RT PCR: NEGATIVE

## 2021-07-09 LAB — CBC WITH DIFFERENTIAL/PLATELET
Abs Immature Granulocytes: 0.03 10*3/uL (ref 0.00–0.07)
Basophils Absolute: 0.1 10*3/uL (ref 0.0–0.1)
Basophils Relative: 1 %
Eosinophils Absolute: 0.2 10*3/uL (ref 0.0–0.5)
Eosinophils Relative: 2 %
HCT: 42.6 % (ref 39.0–52.0)
Hemoglobin: 13.9 g/dL (ref 13.0–17.0)
Immature Granulocytes: 0 %
Lymphocytes Relative: 24 %
Lymphs Abs: 2.1 10*3/uL (ref 0.7–4.0)
MCH: 29.6 pg (ref 26.0–34.0)
MCHC: 32.6 g/dL (ref 30.0–36.0)
MCV: 90.6 fL (ref 80.0–100.0)
Monocytes Absolute: 0.6 10*3/uL (ref 0.1–1.0)
Monocytes Relative: 7 %
Neutro Abs: 5.8 10*3/uL (ref 1.7–7.7)
Neutrophils Relative %: 66 %
Platelets: 245 10*3/uL (ref 150–400)
RBC: 4.7 MIL/uL (ref 4.22–5.81)
RDW: 13.3 % (ref 11.5–15.5)
WBC: 8.8 10*3/uL (ref 4.0–10.5)
nRBC: 0 % (ref 0.0–0.2)

## 2021-07-09 LAB — TROPONIN I (HIGH SENSITIVITY): Troponin I (High Sensitivity): 6 ng/L (ref <18)

## 2021-07-09 LAB — BRAIN NATRIURETIC PEPTIDE: B Natriuretic Peptide: 22.5 pg/mL (ref 0.0–100.0)

## 2021-07-09 MED ORDER — ALBUTEROL SULFATE (2.5 MG/3ML) 0.083% IN NEBU
5.0000 mg | INHALATION_SOLUTION | Freq: Once | RESPIRATORY_TRACT | Status: AC
  Administered 2021-07-09: 5 mg via RESPIRATORY_TRACT
  Filled 2021-07-09: qty 6

## 2021-07-09 MED ORDER — PREDNISONE 50 MG PO TABS: 50.0000 mg | ORAL_TABLET | Freq: Every day | ORAL | 0 refills | Status: DC

## 2021-07-09 NOTE — ED Notes (Signed)
Xray at BS 

## 2021-07-09 NOTE — ED Triage Notes (Signed)
BIB [facility] for resp distress, found SPO2 in the 70s, wheezing, tight, tripoding, diaphoretic, confused, standing on the front porch not wearing his O2. Improved with O2, duoneb and solumedrol. Baseline mentation with his schizophrenia during exacerbations. Lives with wife. BS 111. NSR. [place] (-). NSL 20g L hand.  ?

## 2021-07-09 NOTE — ED Provider Notes (Signed)
?[place] ?Provider Note ? ? ?CSN: [id number] ?Arrival date & time: 07/09/21  5732 ? ?  ? ?History ? ?Chief Complaint  ?Patient presents with  ? Shortness of Breath  ? ? ?Blake Mcknight is a 66 y.o. male. ? ?Pt is a 66 yo male with a pmhx significant for COPD (on 2L), hx tobacco abuse (quit 8 mo ago), htn, hyperlipidemia, and schizophrenia.  Pt said he has had worsening of his sob for the past day.  He has had some more coughing than usual.  He called EMS this am.  He was not wearing his oxygen when they arrived and was very sob.  He was diaphoretic and O2 sats were in the low 70s.  EMS gave him his oxygen, a duoneb, and solumedrol.  Pt is still sob, but is much better than he was.  O2 sats on 2L are 100%.  Pt denies f/c. ? ? ?  ? ?Home Medications ?Prior to Admission medications   ?Medication Sig Start Date End Date Taking? Authorizing Provider  ?predniSONE (DELTASONE) 50 MG tablet Take 1 tablet (50 mg total) by mouth daily with breakfast. 07/09/21  Yes Isla Pence, MD  ?albuterol (PROVENTIL) (2.5 MG/3ML) 0.083% nebulizer solution Take 2.5 mg by nebulization every 4 (four) hours.    [provider]  ?albuterol (VENTOLIN HFA) 108 (90 Base) MCG/ACT inhaler Inhale 2 puffs into the lungs every 4 (four) hours as needed for wheezing or shortness of breath. 02/20/19   Aline August, MD  ?amLODipine (NORVASC) 10 MG tablet Take 10 mg by mouth daily. 06/26/21   [provider]  ?Ascorbic Acid (VITAMIN C PO) Take 1 tablet by mouth 3 (three) times a week.     [provider]  ?atorvastatin (LIPITOR) 10 MG tablet Take 10 mg by mouth daily. 09/21/15   [provider]  ?benzonatate (TESSALON) 200 MG capsule Take 1 capsule (200 mg total) by mouth 2 (two) times daily as needed for cough. 09/05/20   Mariel Aloe, MD  ?benztropine (COGENTIN) 2 MG tablet Take 1 tablet (2 mg total) by mouth 2 (two) times daily. 01/23/21   Arfeen, Arlyce Harman, MD  ?Judithann Sauger  AEROSPHERE 160-9-4.8 MCG/ACT AERO Inhale 2 puffs into the lungs in the morning and at bedtime. 07/18/20   [provider]  ?carvedilol (COREG) 3.125 MG tablet Take 1 tablet (3.125 mg total) by mouth 2 (two) times daily with a meal. ?Patient taking differently: No sig reported 02/20/19   Aline August, MD  ?cetirizine (ZYRTEC) 10 MG tablet Take 10 mg by mouth daily as needed for allergies or rhinitis.    [provider]  ?cholecalciferol (VITAMIN D) 25 MCG (1000 UNIT) tablet Take 4,000 Units by mouth every evening.     [provider]  ?clindamycin (CLEOCIN) 300 MG capsule Take 1 capsule (300 mg total) by mouth 4 (four) times daily. X 7 days 03/08/21   Veryl Speak, MD  ?Cyanocobalamin (B-12 PO) Take 1 tablet by mouth every other day.    [provider]  ?enalapril (VASOTEC) 10 MG tablet Take 10 mg by mouth daily. 06/04/16   [provider]  ?famotidine (PEPCID) 40 MG tablet Take 40 mg by mouth 2 (two) times daily. 12/28/18   [provider]  ?guaiFENesin-dextromethorphan (ROBITUSSIN DM) 100-10 MG/5ML syrup Take 10 mLs by mouth every 4 (four) hours as needed for cough. 02/20/19   Aline August, MD  ?HYDROCODONE-CHLORPHENIRAMINE PO Take 5 mLs by mouth every 6 (  six) hours as needed (cough and pain).    [provider]  ?ipratropium (ATROVENT) 0.02 % nebulizer solution Take 0.5 mg by nebulization every 4 (four) hours.    [provider]  ?ipratropium-albuterol (DUONEB) 0.5-2.5 (3) MG/3ML SOLN Take 3 mLs by nebulization every 4 (four) hours. 04/27/21   [provider]  ?mirtazapine (REMERON) 30 MG tablet Take 1 tablet (30 mg total) by mouth at bedtime. 01/23/21 01/23/22  Arfeen, Arlyce Harman, MD  ?montelukast (SINGULAIR) 10 MG tablet Take 1 tablet (10 mg total) by mouth daily. ?Patient not taking: No sig reported 06/23/19 09/21/19  Harvie Heck, MD  ?multivitamin (ONE-A-DAY MEN'S) TABS tablet Take 1 tablet by mouth daily.    [provider]   ?OXYGEN Inhale 2 L/min into the lungs continuous.     [provider]  ?polyethylene glycol (MIRALAX / GLYCOLAX) 17 g packet Take 17 g by mouth daily as needed for mild constipation.    [provider]  ?predniSONE (DELTASONE) 10 MG tablet Take 2 tablets (20 mg total) by mouth 2 (two) times daily with a meal. 03/08/21   Veryl Speak, MD  ?risperiDONE (RISPERDAL) 3 MG tablet Take 1 tablet (3 mg total) by mouth 2 (two) times daily. 01/23/21   Arfeen, Arlyce Harman, MD  ?sodium chloride (OCEAN) 0.65 % SOLN nasal spray Place 1 spray into both nostrils as needed for congestion.    [provider]  ?tamsulosin (FLOMAX) 0.4 MG CAPS capsule Take 0.4 mg by mouth at bedtime. 07/23/20   [provider]  ?   ? ?Allergies    ?Patient has no known allergies.   ? ?Review of Systems   ?Review of Systems  ?Respiratory:  Positive for cough, shortness of breath and wheezing.   ?All other systems reviewed and are negative. ? ?Physical Exam ?Updated Vital Signs ?BP (!) 148/95   Pulse 88   Temp 97.9 ?F (36.6 ?C) (Oral)   Resp 17   Ht 6' (1.829 m)   Wt 74.8 kg   SpO2 94%   BMI 22.38 kg/m?  ?Physical Exam ?Vitals and nursing note reviewed.  ?Constitutional:   ?   Appearance: He is well-developed.  ?HENT:  ?   Head: Normocephalic and atraumatic.  ?   Mouth/Throat:  ?   Mouth: Mucous membranes are moist.  ?   Pharynx: Oropharynx is clear.  ?Eyes:  ?   Extraocular Movements: Extraocular movements intact.  ?   Pupils: Pupils are equal, round, and reactive to light.  ?Cardiovascular:  ?   Rate and Rhythm: Normal rate and regular rhythm.  ?Pulmonary:  ?   Effort: Pulmonary effort is normal.  ?   Breath sounds: Wheezing present.  ?Abdominal:  ?   General: Bowel sounds are normal.  ?   Palpations: Abdomen is soft.  ?Musculoskeletal:     ?   General: Normal range of motion.  ?   Cervical back: Normal range of motion and neck supple.  ?Skin: ?   General: Skin is warm.  ?   Capillary Refill: Capillary refill takes  less than 2 seconds.  ?Neurological:  ?   General: No focal deficit present.  ?   Mental Status: He is alert and oriented to person, place, and time.  ?Psychiatric:     ?   Mood and Affect: Mood normal.     ?   Behavior: Behavior normal.  ? ? ?ED Results / Procedures / Treatments   ?Labs ?(all labs ordered are listed,  but only abnormal results are displayed) ?Labs Reviewed  ?COMPREHENSIVE METABOLIC PANEL - Abnormal; Notable for the following components:  ?    Result Value  ? Glucose, Bld 105 (*)   ? BUN 5 (*)   ? All other components within normal limits  ?RESP PANEL BY RT-PCR (FLU A&B, COVID) ARPGX2  ?CBC WITH DIFFERENTIAL/PLATELET  ?BRAIN NATRIURETIC PEPTIDE  ?TROPONIN I (HIGH SENSITIVITY)  ?TROPONIN I (HIGH SENSITIVITY)  ? ? ?EKG ?EKG Interpretation ? ?Date/Time:  Tuesday July 09 2021 09:21:58 EDT ?Ventricular Rate:  94 ?PR Interval:  139 ?QRS Duration: 82 ?QT Interval:  324 ?QTC Calculation: 406 ?R Axis:   92 ?Text Interpretation: Sinus rhythm Right axis deviation No significant change since last tracing Confirmed by Isla Pence [phone]) on 07/09/2021 9:44:21 AM ? ?Radiology ?DG Chest Portable 1 View ? ?Result Date: 07/09/2021 ?CLINICAL DATA:  Shortness of breath, respiratory distress EXAM: PORTABLE CHEST 1 VIEW COMPARISON:  11/20/2020 FINDINGS: The heart size and mediastinal contours are within normal limits. Severe, bullous emphysema. The visualized skeletal structures are unremarkable. IMPRESSION: Severe, bullous emphysema. No acute abnormality of the lungs in AP portable projection. Electronically Signed   By: Delanna Ahmadi M.D.   On: 07/09/2021 09:43   ? ?Procedures ?Procedures  ? ? ?Medications Ordered in ED ?Medications  ?albuterol (PROVENTIL) (2.5 MG/3ML) 0.083% nebulizer solution 5 mg (5 mg Nebulization Given 07/09/21 0953)  ? ? ?ED Course/ Medical Decision Making/ A&P ?  ?                        ?Medical Decision Making ?Amount and/or Complexity of Data Reviewed ?Labs: ordered. ?Radiology:  ordered. ? ?Risk ?Prescription drug management. ? ? ?This patient presents to the ED for concern of sob, this involves an extensive number of treatment options, and is a complaint that carries with it a high risk of complications

## 2021-07-09 NOTE — Discharge Instructions (Addendum)
Take the 50 mg dose of prednisone for 5 days starting tomorrow.  Then, go back to your regular prednisone dose. ?

## 2021-07-09 NOTE — ED Notes (Signed)
Pt ambulated while wearing O2 ? ?Pt tolerated task well. Walked to nurses station and back.  ?At rest pt o2 sat was 99%, HR 90 ?While walking on 2L o2 pts o2 sat maintained 97-100.  ?Pt reports some shortness of breath upon return, but not more than usual for this level of activity.  ?Upon return, pt o2 sat at 97%, HR 118, RR 26  ?After 1-2 mintues, pt returns to rest o2 sat 99, HR 95, RR 15.  ? ?All the above done with pt wearing 2L o2 via  ?

## 2021-07-09 NOTE — ED Notes (Signed)
EDP at [facility] 

## 2021-07-17 ENCOUNTER — Encounter: Payer: Self-pay | Admitting: Emergency Medicine

## 2021-07-17 ENCOUNTER — Ambulatory Visit (INDEPENDENT_AMBULATORY_CARE_PROVIDER_SITE_OTHER): Payer: Medicare Other | Admitting: Emergency Medicine

## 2021-07-17 DIAGNOSIS — R918 Other nonspecific abnormal finding of lung field: Secondary | ICD-10-CM | POA: Diagnosis not present

## 2021-07-17 DIAGNOSIS — J449 Chronic obstructive pulmonary disease, unspecified: Secondary | ICD-10-CM

## 2021-07-17 DIAGNOSIS — C3431 Malignant neoplasm of lower lobe, right bronchus or lung: Secondary | ICD-10-CM | POA: Insufficient documentation

## 2021-07-17 NOTE — Addendum Note (Signed)
Addended by: Gavin Potters R on: 07/17/2021 03:18 PM ? ? Modules accepted: Orders ? ?

## 2021-07-17 NOTE — Progress Notes (Signed)
? ?Subjective:  ? ? Patient ID: Blake Mcknight, male    DOB: 06/11/1955, 65 y.o.   MRN: [id number] ? ?HPI ?66 year old man with a history of tobacco use (41 pack years), associated emphysematous COPD, chronic hypoxemic respiratory failure on 2 L/min, schizophrenia, hypertension, hyperlipidemia. ?He was in the emergency department/25/23 with an acute exacerbation of his COPD with associated hypoxemia.  He was discharged on prednisone.  Maintenance regimen includes ? ?Referred today for abnormal CT scan of the chest. Feels back to baseline. He is back on breztri and duoneb reliably. On 2L/min O2.  ? ? ?CT scan of the chest from 06/05/2021 at [facility], reviewed by me.  Shows severe extensive bullous emphysematous change especially apically.  There are 2 discrete right middle lobe opacities 1.3 cm and 1.1 cm that are slightly larger than on a previous CT done on 06/20/2019.  There is a new 2.9 x 2.1 cm posterior right upper lobe nodule.  1.9 x 0.9 cm anterior left upper lobe nodule ? ? ? ?Review of Systems ?As per HPI ? ?Past Medical History:  ?Diagnosis Date  ? COPD (chronic obstructive pulmonary disease) ([place])   ? with bullous emphysema.   ? Heavy cigarette smoker before 2003  ? pt claims only 10 cigs per day, never heavier amounts.   ? HLD (hyperlipidemia)   ? Hypertension   ? Schizophrenia ([place]) 06/28/2013  ? This is a chronic condition and he lives with family  ?  ? ?Family History  ?Problem Relation Age of Onset  ? Diabetes Mellitus II Mother   ? Diabetes Mother   ? Heart disease Father   ? Stroke Maternal Aunt   ? CAD Neg Hx   ?  ? ?Social History  ? ?Socioeconomic History  ? Marital status: Single  ?  Spouse name: Not on file  ? Number of children: Not on file  ? Years of education: Not on file  ? Highest education level: Not on file  ?Occupational History  ? Occupation: disabled  ?Tobacco Use  ? Smoking status: Former  ?  Packs/day: 1.00  ?  Years: 41.00  ?  Pack years: 41.00  ?  Types: Cigarettes  ?  Smokeless tobacco: Never  ? Tobacco comments:  ?  Quit 7 - 8 months ago [facility] 07/17/21  ?Vaping Use  ? Vaping Use: Never used  ?Substance and Sexual Activity  ? Alcohol use: No  ? Drug use: No  ? Sexual activity: Yes  ?Other Topics Concern  ? Not on file  ?Social History Narrative  ? Not on file  ? ?Social Determinants of Health  ? ?Financial Resource Strain: Not on file  ?Food Insecurity: No Food Insecurity  ? Worried About Charity fundraiser in the Last Year: Never true  ? Ran Out of Food in the Last Year: Never true  ?Transportation Needs: No Transportation Needs  ? Lack of Transportation (Medical): No  ? Lack of Transportation (Non-Medical): No  ?Physical Activity: Not on file  ?Stress: Not on file  ?Social Connections: Not on file  ?Intimate Partner Violence: Not on file  ?  ? ?No Known Allergies  ? ?Outpatient Medications Prior to Visit  ?Medication Sig Dispense Refill  ? albuterol (PROVENTIL) (2.5 MG/3ML) 0.083% nebulizer solution Take 2.5 mg by nebulization every 4 (four) hours.    ? albuterol (VENTOLIN HFA) 108 (90 Base) MCG/ACT inhaler Inhale 2 puffs into the lungs every 4 (four) hours as needed for wheezing or shortness of  breath.    ? amLODipine (NORVASC) 10 MG tablet Take 10 mg by mouth daily.    ? Ascorbic Acid (VITAMIN C PO) Take 1 tablet by mouth 3 (three) times a week.     ? atorvastatin (LIPITOR) 10 MG tablet Take 10 mg by mouth daily.    ? benzonatate (TESSALON) 200 MG capsule Take 1 capsule (200 mg total) by mouth 2 (two) times daily as needed for cough. 20 capsule 0  ? benztropine (COGENTIN) 2 MG tablet Take 1 tablet (2 mg total) by mouth 2 (two) times daily. 180 tablet 1  ? BREZTRI AEROSPHERE 160-9-4.8 MCG/ACT AERO Inhale 2 puffs into the lungs in the morning and at bedtime.    ? carvedilol (COREG) 3.125 MG tablet Take 1 tablet (3.125 mg total) by mouth 2 (two) times daily with a meal. (Patient taking differently: Take 1.5625-3.125 mg by mouth See admin instructions. Take one tablet by mouth daily  in the morning and one-half tablet by mouth daily in the evening.)  0  ? cetirizine (ZYRTEC) 10 MG tablet Take 10 mg by mouth daily as needed for allergies or rhinitis.    ? cholecalciferol (VITAMIN D) 25 MCG (1000 UNIT) tablet Take 4,000 Units by mouth every evening.     ? clindamycin (CLEOCIN) 300 MG capsule Take 1 capsule (300 mg total) by mouth 4 (four) times daily. X 7 days 28 capsule 0  ? Cyanocobalamin (B-12 PO) Take 1 tablet by mouth every other day.    ? enalapril (VASOTEC) 10 MG tablet Take 10 mg by mouth daily.    ? famotidine (PEPCID) 40 MG tablet Take 40 mg by mouth 2 (two) times daily.    ? guaiFENesin-dextromethorphan (ROBITUSSIN DM) 100-10 MG/5ML syrup Take 10 mLs by mouth every 4 (four) hours as needed for cough. 236 mL 0  ? HYDROCODONE-CHLORPHENIRAMINE PO Take 5 mLs by mouth every 6 (six) hours as needed (cough and pain).    ? ipratropium (ATROVENT) 0.02 % nebulizer solution Take 0.5 mg by nebulization every 4 (four) hours.    ? ipratropium-albuterol (DUONEB) 0.5-2.5 (3) MG/3ML SOLN Take 3 mLs by nebulization every 4 (four) hours.    ? mirtazapine (REMERON) 30 MG tablet Take 1 tablet (30 mg total) by mouth at bedtime. 90 tablet 1  ? multivitamin (ONE-A-DAY MEN'S) TABS tablet Take 1 tablet by mouth daily.    ? OXYGEN Inhale 2 L/min into the lungs continuous.     ? polyethylene glycol (MIRALAX / GLYCOLAX) 17 g packet Take 17 g by mouth daily as needed for mild constipation.    ? predniSONE (DELTASONE) 10 MG tablet Take 2 tablets (20 mg total) by mouth 2 (two) times daily with a meal. 12 tablet 0  ? predniSONE (DELTASONE) 50 MG tablet Take 1 tablet (50 mg total) by mouth daily with breakfast. 5 tablet 0  ? risperiDONE (RISPERDAL) 3 MG tablet Take 1 tablet (3 mg total) by mouth 2 (two) times daily. 180 tablet 1  ? sodium chloride (OCEAN) 0.65 % SOLN nasal spray Place 1 spray into both nostrils as needed for congestion.    ? tamsulosin (FLOMAX) 0.4 MG CAPS capsule Take 0.4 mg by mouth at bedtime.    ?  montelukast (SINGULAIR) 10 MG tablet Take 1 tablet (10 mg total) by mouth daily. (Patient not taking: No sig reported) 90 tablet 0  ? ?No facility-administered medications prior to visit.  ? ? ? ?   ?Objective:  ? Physical Exam ?Vitals:  ? 07/17/21  1422  ?BP: 118/68  ?Pulse: 89  ?Temp: 98.5 ?F (36.9 ?C)  ?TempSrc: Oral  ?SpO2: 100%  ?Weight: 157 lb (71.2 kg)  ?Height: '5\' 7"'$  (1.702 m)  ? ? ?Gen: Debilitated man in a wheelchair, well-nourished, in no distress, depressed affect ? ?ENT: No lesions,  mouth clear,  oropharynx clear, no postnasal drip ? ?Neck: No JVD, no stridor ? ?Lungs: No use of accessory muscles, very distant, no crackles or wheezing on normal respiration, no wheeze on forced expiration ? ?Cardiovascular: RRR, heart sounds normal, no murmur or gallops, no peripheral edema ? ?Musculoskeletal: No deformities, no cyanosis or clubbing ? ?Neuro: alert, awake, non focal ? ?Skin: Warm, no lesions or rash ? ? ?   ?Assessment & Plan:  ?Pulmonary nodules ?2 right lower lobe pulmonary nodules that have grown slightly, the right upper lobe nodule is new, suspicious for primary malignancy.  He has severe surrounding bullous emphysema and I think he is very high risk for any kind of biopsy including bronchoscopy.  Not sure that he would be able to tolerate positive pressure or biopsies.  I think the most straightforward approach right now would be to get a PET scan to further risk assess the nodules.  Given his debilitated state, severe emphysema we may need to talk to radiation oncology about empiric treatment of nodular disease depending on our degree of suspicion.  I will get the PET scan and then review with him after completed. ? ?COPD (chronic obstructive pulmonary disease) ([place]) ?With profound bullous emphysema.  He is on Breztri, scheduled nebs.  Plan to continue same regimen.  Continue supplemental oxygen 2 L/min. ? ? ?Baltazar Apo, MD, PhD ?07/17/2021, 2:39 PM ?[facility] ?[phone] or if no answer before 7:00PM call [phone] ?For any issues after 7:00PM please call eLink [phone] ? ? ?

## 2021-07-17 NOTE — Assessment & Plan Note (Signed)
2 right lower lobe pulmonary nodules that have grown slightly, the right upper lobe nodule is new, suspicious for primary malignancy.  He has severe surrounding bullous emphysema and I think he is very high risk for any kind of biopsy including bronchoscopy.  Not sure that he would be able to tolerate positive pressure or biopsies.  I think the most straightforward approach right now would be to get a PET scan to further risk assess the nodules.  Given his debilitated state, severe emphysema we may need to talk to radiation oncology about empiric treatment of nodular disease depending on our degree of suspicion.  I will get the PET scan and then review with him after completed. ?

## 2021-07-17 NOTE — Patient Instructions (Addendum)
We will obtain a PET scan to further evaluate your pulmonary nodules. ?Follow Dr. Lamonte Sakai after the PET scan so we can review together.  Depending on the results we will decide next steps in your evaluation and treatment. ?Please continue your Breztri 2 puffs twice a day.  Rinse and gargle after using. ?Continue your nebulizer treatments as you have been doing them. ?Continue your oxygen at 2 L/min. ?Follow Dr. Lamonte Sakai next available after your PET scan ?

## 2021-07-17 NOTE — Assessment & Plan Note (Signed)
With profound bullous emphysema.  He is on Breztri, scheduled nebs.  Plan to continue same regimen.  Continue supplemental oxygen 2 L/min. ?

## 2021-07-23 ENCOUNTER — Telehealth (HOSPITAL_BASED_OUTPATIENT_CLINIC_OR_DEPARTMENT_OTHER): Payer: Medicare Other | Admitting: Psychiatry

## 2021-07-23 ENCOUNTER — Encounter (HOSPITAL_COMMUNITY): Payer: Self-pay | Admitting: Psychiatry

## 2021-07-23 DIAGNOSIS — F99 Mental disorder, not otherwise specified: Secondary | ICD-10-CM

## 2021-07-23 DIAGNOSIS — F200 Paranoid schizophrenia: Secondary | ICD-10-CM

## 2021-07-23 DIAGNOSIS — F5105 Insomnia due to other mental disorder: Secondary | ICD-10-CM

## 2021-07-23 MED ORDER — BENZTROPINE MESYLATE 2 MG PO TABS: 2.0000 mg | ORAL_TABLET | Freq: Two times a day (BID) | ORAL | 1 refills | Status: DC

## 2021-07-23 MED ORDER — MIRTAZAPINE 30 MG PO TABS: 30.0000 mg | ORAL_TABLET | Freq: Every day | ORAL | 1 refills | Status: DC

## 2021-07-23 MED ORDER — RISPERIDONE 3 MG PO TABS: 3.0000 mg | ORAL_TABLET | Freq: Two times a day (BID) | ORAL | 1 refills | Status: DC

## 2021-07-23 NOTE — Progress Notes (Signed)
Virtual Visit via Telephone Note ? ?I connected with Blake Mcknight on 07/23/21 at  2:00 PM EDT by telephone and verified that I am speaking with the correct person using two identifiers. ? ?Location: ?Patient: Home ?Provider: Home Office ?  ?I discussed the limitations, risks, security and privacy concerns of performing an evaluation and management service by telephone and the availability of in person appointments. I also discussed with the patient that there may be a patient responsible charge related to this service. The patient expressed understanding and agreed to proceed. ? ? ?History of Present Illness: ?Patient is evaluated by phone session.  His sister was available.  Patient is a poor historian.  As per sister he is doing well on his medication.  He does not get agitated, loud, angry and he sleeps good.  Sometimes he does talk to himself.  Patient reported occasionally chronic auditory hallucination which is nonspecific but it does not bother him.  His shakes are much improved since taking the Cogentin.  He was recently admitted because of exacerbation of COPD.  He does go outside but due to heavy pollen does not like to stay too long.  He is eating good.  Patient denies any suicidal thoughts, paranoia.  His weight is unchanged from the past.  He denies drinking or using any illegal substances. ? ?Past Psychiatric History: Reviewed. ?H/O schizophrenia and admitted at [facility] ago. H/O paranoid, delusional, disorganized behavior and having mood swings. ? ?Psychiatric Specialty Exam: ?Physical Exam  ?Review of Systems  ?Weight 153 lb (69.4 kg).There is no height or weight on file to calculate BMI.  ?General Appearance: NA  ?Eye Contact:  NA  ?Speech:  Slow  ?Volume:  Decreased  ?Mood:  Euthymic  ?Affect:  NA  ?Thought Process:  Descriptions of Associations: Loose  ?Orientation:  Full (Time, Place, and Person)  ?Thought Content:  Hallucinations: Auditory ?Chronic hallucination  ?Suicidal  Thoughts:  No  ?Homicidal Thoughts:  No  ?Memory:  Immediate;   Fair ?Recent;   Fair ?Remote;   Fair  ?Judgement:  Fair  ?Insight:  Shallow  ?Psychomotor Activity:  NA  ?Concentration:  Concentration: Fair and Attention Span: Fair  ?Recall:  Poor  ?Fund of Knowledge:  Fair  ?Language:  Fair  ?Akathisia:  NA  ?Handed:  Right  ?AIMS (if indicated):     ?Assets:  Desire for Improvement ?Housing ?Social Support  ?ADL's:  Intact  ?Cognition:  Impaired,  Mild  ?Sleep:   good  ? ? ? ? ?Assessment and Plan: ?Schizophrenia chronic paranoid type.  Insomnia due to mental disorder. ? ?Patient is stable on his current medication.  Continue Risperdal 3 mg twice a day, mirtazapine 30 mg at bedtime and Cogentin 2 mg twice a day.  We have tried cutting down his Cogentin in the past but his tremors get worse.  Discussed medication side effects and benefits.  Recommended to call us back if he has any question or any concern.  Treatment plan discussed with his sister who is very involved in his treatment plan.  Follow-up in 6 months. ? ?Follow Up Instructions: ? ?  ?I discussed the assessment and treatment plan with the patient. The patient was provided an opportunity to ask questions and all were answered. The patient agreed with the plan and demonstrated an understanding of the instructions. ?  ?The patient was advised to call back or seek an in-person evaluation if the symptoms worsen or if the condition fails to improve  as anticipated. ? ?Collaboration of Care: Primary Care Provider AEB notes are available in epic to review ? ?Patient/Guardian was advised Release of Information must be obtained prior to any record release in order to collaborate their care with an outside provider. Patient/Guardian was advised if they have not already done so to contact the registration department to sign all necessary forms in order for [place] to release information regarding their care.  ? ?Consent: Patient/Guardian gives verbal consent for treatment  and assignment of benefits for services provided during this visit. Patient/Guardian expressed understanding and agreed to proceed.   ? ?I provided 15 minutes of non-face-to-face time during this encounter. ? ? ?Kathlee Nations, MD  ?

## 2021-07-24 ENCOUNTER — Ambulatory Visit (HOSPITAL_COMMUNITY)
Admission: RE | Admit: 2021-07-24 | Discharge: 2021-07-24 | Disposition: A | Discharge status: Home / Self Care | Payer: Medicare Other | Source: Ambulatory Visit | Attending: Emergency Medicine | Admitting: Emergency Medicine

## 2021-07-24 DIAGNOSIS — J439 Emphysema, unspecified: Secondary | ICD-10-CM | POA: Diagnosis not present

## 2021-07-24 DIAGNOSIS — R918 Other nonspecific abnormal finding of lung field: Secondary | ICD-10-CM | POA: Diagnosis not present

## 2021-07-24 DIAGNOSIS — I2510 Atherosclerotic heart disease of native coronary artery without angina pectoris: Secondary | ICD-10-CM | POA: Diagnosis not present

## 2021-07-24 DIAGNOSIS — I700 Atherosclerosis of aorta: Secondary | ICD-10-CM | POA: Diagnosis not present

## 2021-07-24 DIAGNOSIS — N400 Enlarged prostate without lower urinary tract symptoms: Secondary | ICD-10-CM | POA: Diagnosis not present

## 2021-07-24 LAB — GLUCOSE, CAPILLARY: Glucose-Capillary: 102 mg/dL — ABNORMAL HIGH (ref 70–99)

## 2021-07-24 MED ORDER — FLUDEOXYGLUCOSE F - 18 (FDG) INJECTION
7.6300 | Freq: Once | INTRAVENOUS | Status: AC | PRN
Start: 2021-07-24 — End: 2021-07-24
  Administered 2021-07-24: 7.63 via INTRAVENOUS

## 2021-07-30 ENCOUNTER — Other Ambulatory Visit: Payer: Self-pay | Admitting: *Deleted

## 2021-07-30 NOTE — Patient Outreach (Signed)
?[facility]) Care Management ?Telephonic RN Care Manager Note ? ? ?07/30/2021 ?Name:  Blake Mcknight MRN:  [id number] DOB:  1955/11/08 ? ?Summary: ?Outgoing call placed to member's sister, successful.  Denies any urgent concerns, encouraged to contact this care manager with questions.  ? ? ?Subjective: ?Blake Mcknight is an 66 y.o. year old male who is a primary patient of [name], MD. The care management team was consulted for assistance with care management and/or care coordination needs.   ? ?Telephonic RN Care Manager completed Telephone Visit today. ? ?Objective:  ? ?Medications Reviewed Today   ? ? Reviewed by Kathlee Nations, MD (Psychiatrist) on 07/23/21 at 1415  Med List Status: <None>  ? ?Medication Order Taking? Sig Documenting Provider Last Dose Status Informant  ?albuterol (PROVENTIL) (2.5 MG/3ML) 0.083% nebulizer solution [id number] No Take 2.5 mg by nebulization every 4 (four) hours. [provider] Taking Active Family Member  ?albuterol (VENTOLIN HFA) 108 (90 Base) MCG/ACT inhaler [id number] No Inhale 2 puffs into the lungs every 4 (four) hours as needed for wheezing or shortness of breath. Aline August, MD Taking Active Family Member  ?amLODipine (NORVASC) 10 MG tablet [id number] No Take 10 mg by mouth daily. [provider] Taking Active   ?Ascorbic Acid (VITAMIN C PO) [id number] No Take 1 tablet by mouth 3 (three) times a week.  [provider] Taking Active Family Member  ?atorvastatin (LIPITOR) 10 MG tablet [id number] No Take 10 mg by mouth daily. [provider] Taking Active Family Member  ?         ?Med Note [facility], GREG A   Sat Sep 22, 2015 12:12 AM)    ?benzonatate (TESSALON) 200 MG capsule [id number] No Take 1 capsule (200 mg total) by mouth 2 (two) times daily as needed for cough. Mariel Aloe, MD Taking Active Family Member  ?benztropine (COGENTIN) 2 MG tablet [id number] No Take 1 tablet (2 mg total) by mouth 2 (two)  times daily. Kathlee Nations, MD Taking Active   ?BREZTRI AEROSPHERE 160-9-4.8 MCG/ACT AERO [id number] No Inhale 2 puffs into the lungs in the morning and at bedtime. [provider] Taking Active Family Member  ?carvedilol (COREG) 3.125 MG tablet [id number] No Take 1 tablet (3.125 mg total) by mouth 2 (two) times daily with a meal.  ?Patient taking differently: Take 1.5625-3.125 mg by mouth See admin instructions. Take one tablet by mouth daily in the morning and one-half tablet by mouth daily in the evening.  ? Aline August, MD Taking Active Family Member  ?cetirizine (ZYRTEC) 10 MG tablet [id number] No Take 10 mg by mouth daily as needed for allergies or rhinitis. [provider] Taking Active Family Member  ?cholecalciferol (VITAMIN D) 25 MCG (1000 UNIT) tablet [id number] No Take 4,000 Units by mouth every evening.  [provider] Taking Active Family Member  ?clindamycin (CLEOCIN) 300 MG capsule [id number] No Take 1 capsule (300 mg total) by mouth 4 (four) times daily. X 7 days Veryl Speak, MD Taking Active   ?Cyanocobalamin (B-12 PO) [id number] No Take 1 tablet by mouth every other day. [provider] Taking Active Family Member  ?enalapril (VASOTEC) 10 MG tablet [id number] No Take 10 mg by mouth daily. [provider] Taking Active Family Member  ?famotidine (PEPCID) 40 MG tablet [id number] No Take 40 mg by mouth 2 (two) times daily. [provider] Taking Active Family Member  ?guaiFENesin-dextromethorphan (ROBITUSSIN DM) 100-10 MG/5ML syrup [id number] No Take 10 mLs by mouth  every 4 (four) hours as needed for cough. Aline August, MD Taking Active Family Member  ?HYDROCODONE-CHLORPHENIRAMINE PO [id number] No Take 5 mLs by mouth every 6 (six) hours as needed (cough and pain). [provider] Taking Active Family Member  ?ipratropium (ATROVENT) 0.02 % nebulizer solution [id number] No Take 0.5 mg by nebulization every 4 (four) hours. [provider] Taking Active Family Member  ?ipratropium-albuterol (DUONEB) 0.5-2.5 (3) MG/3ML SOLN [id number] No Take 3 mLs by nebulization every 4 (four) hours. [provider] Taking Active   ?mirtazapine (REMERON) 30 MG tablet [id number] No Take 1 tablet (30 mg total) by mouth at bedtime. Kathlee Nations, MD Taking Active   ?montelukast (SINGULAIR) 10 MG tablet [id number] No Take 1 tablet (10 mg total) by mouth daily.  ?Patient not taking: No sig reported  ? Harvie Heck, MD Not Taking Expired 09/21/19 2359 Family Member  ?multivitamin (ONE-A-DAY MEN'S) TABS tablet [id number] No Take 1 tablet by mouth daily. [provider] Taking Active Family Member  ?OXYGEN [id number] No Inhale 2 L/min into the lungs continuous.  [provider] Taking Active Family Member  ?polyethylene glycol (MIRALAX / GLYCOLAX) 17 g packet [id number] No Take 17 g by mouth daily as needed for mild constipation. [provider] Taking Active Family Member  ?predniSONE (DELTASONE) 10 MG tablet [id number] No Take 2 tablets (20 mg total) by mouth 2 (two) times daily with a meal. Veryl Speak, MD Taking Active   ?predniSONE (DELTASONE) 50 MG tablet [id number] No Take 1 tablet (50 mg total) by mouth daily with breakfast. Isla Pence, MD Taking Active   ?risperiDONE (RISPERDAL) 3 MG tablet [id number] No Take 1 tablet (3 mg total) by mouth 2 (two) times daily. Kathlee Nations, MD Taking Active   ?sodium chloride (OCEAN) 0.65 % SOLN nasal spray [id number] No Place 1 spray into both nostrils as needed for congestion. [provider] Taking Active Family Member  ?tamsulosin (FLOMAX) 0.4 MG CAPS capsule [id number] No Take 0.4 mg by mouth at bedtime. [provider] Taking Active Family Member  ? ?  ?  ? ?  ? ? ? ?SDOH:  (Social Determinants of Health) assessments and interventions performed:  ? ? ? ?Care Plan ? ?Review of patient past medical history, allergies, medications, health status, including review  of consultants reports, laboratory and other test data, was performed as part of comprehensive evaluation for care management services.  ? ?Care Plan : [facility] Plan of Care (Adult)  ?Updates made by [name], RN since 07/30/2021 12:00 AM  ?  ? ?Problem: Difficulty with management of chronic medical conditions, HTN and COPD   ?Priority: High  ?  ? ?Long-Range Goal: Memer and family will verbalize adequate plan of care to manage chronic health conditions, HTN and COPD (goal extended, continues to work on stability)   ?Start Date: 02/01/2021  ?Expected End Date: 02/01/2022  ?Recent Progress: On track  ?Priority: High  ?Note:   ?Current Barriers:  ?Chronic Disease Management support and education needs related to HTN and COPD ? ?RNCM Clinical Goal(s):  ?Patient will verbalize understanding of plan for management of HTN and COPD as evidenced by sister verbalizing adequate plan of care and COPD zones ?take all medications exactly as prescribed and will call provider for medication related questions as evidenced by Sister reporting adherence ?continue to work with RN Care Manager to address care management and care coordination needs related to  HTN and COPD as evidenced by adherence to CM Team Scheduled appointments  through collaboration with Consulting civil engineer, provider, and care team.  ? ?Interventions: ?Inter-disciplinary care team collaboration (see longitudinal plan of care) ?Evaluation of current treatment plan related to  self management and patient's adherence to plan as established by provider ? ?Hypertension Interventions: ?Last practice recorded BP readings:  ?BP Readings from Last 3 Encounters:  ?07/17/21 118/68  ?07/09/21 129/77  ?03/08/21 138/81  ?Most recent eGFR/CrCl: No results found for: EGFR  No components found for: CRCL ? ?Evaluation of current treatment plan related to hypertension self management and patient's adherence to plan as established by provider; ?Provided education to patient re: stroke  prevention, s/s of heart attack and stroke; ?Discussed plans with patient for ongoing care management follow up and provided patient with direct contact information for care management team; ?Advised patient, pro

## 2021-08-08 ENCOUNTER — Encounter: Payer: Self-pay | Admitting: Emergency Medicine

## 2021-08-08 ENCOUNTER — Ambulatory Visit (INDEPENDENT_AMBULATORY_CARE_PROVIDER_SITE_OTHER): Payer: Medicare Other | Admitting: Emergency Medicine

## 2021-08-08 VITALS — BP 120/78 | HR 93 | Temp 98.4°F | Wt 157.0 lb

## 2021-08-08 DIAGNOSIS — J449 Chronic obstructive pulmonary disease, unspecified: Secondary | ICD-10-CM | POA: Diagnosis not present

## 2021-08-08 DIAGNOSIS — R918 Other nonspecific abnormal finding of lung field: Secondary | ICD-10-CM

## 2021-08-08 MED ORDER — AMOXICILLIN-POT CLAVULANATE 875-125 MG PO TABS: 1.0000 | ORAL_TABLET | Freq: Two times a day (BID) | ORAL | 0 refills | Status: DC

## 2021-08-08 NOTE — Patient Instructions (Signed)
Please continue your Breztri 2 puffs twice a day.  Rinse and gargle after using. Use your albuterol nebulizer up to every 4 hours if needed for shortness of breath, chest tightness, wheezing. Use your albuterol inhaler 2 puffs if needed for shortness of breath. Prednisone 20 mg twice a day as you have been taking it Continue your oxygen at all times. Please take Augmentin 875 mg twice a day for 14 days. We will repeat your CT scan of the chest in 1 month Follow Dr. Lamonte Sakai after your CT scan of the chest so we can review.

## 2021-08-08 NOTE — Assessment & Plan Note (Signed)
PET scan reviewed.  Shows that there has been some evolution of the pulmonary nodular disease that was noted on the CT scan chest 06/05/2021.  Some of the nodular foci are smaller, cavitary.  There is a new focus in the posterior right upper lobe that is hypermetabolic on PET.  The other foci do not show strong hypermetabolic activity.  Question whether this is an infectious process given the waxing and waning and the rapid onset in the posterior right upper lobe.  He has a significantly hypermetabolic subcarinal node, question malignancy versus infection.  I do not think he can tolerate endobronchial ultrasound or transbronchial biopsies given his debilitated state and his severe emphysema.  I will try to treat him for possible multifocal pneumonia with Augmentin, see if he gets clinical improvement.  Then check a repeat CT chest in about 1 month to look for radiographical resolution.  If the nodules, adenopathy persist then we will have to discuss options with patient, family, thoracic conference.

## 2021-08-08 NOTE — Progress Notes (Signed)
Subjective:    Patient ID: Blake Mcknight, male    DOB: Oct 11, 1955, 66 y.o.   MRN: [id number]  HPI 66 year old man with a history of tobacco use (41 pack years), associated emphysematous COPD, chronic hypoxemic respiratory failure on 2 L/min, schizophrenia, hypertension, hyperlipidemia. He was in the emergency department 07/09/21 with an acute exacerbation of his COPD with associated hypoxemia.  He was discharged on prednisone.  Maintenance regimen includes  Referred today for abnormal CT scan of the chest. Feels back to baseline. He is back on breztri and duoneb reliably. On 2L/min O2.   CT scan of the chest from 06/05/2021 at [facility] urology, reviewed by me.  Shows severe extensive bullous emphysematous change especially apically.  There are 2 discrete right middle lobe opacities 1.3 cm and 1.1 cm that are slightly larger than on a previous CT done on 06/20/2019.  There is a new 2.9 x 2.1 cm posterior right upper lobe nodule.  1.9 x 0.9 cm anterior left upper lobe nodule   ROV 08/08/2021 --Mr. Fix is 40, follows up today for his severe emphysematous COPD with associated chronic hypoxemic respiratory failure as well as pulmonary nodular disease noted on chest imaging.  The scan was done at [facility] urology, showed 2 discrete right middle lobe opacities and a new 2.9 cm posterior right upper lobe nodule.  1.9 cm left upper lobe nodule.  He is quite debilitated and unclear whether he can tolerate biopsy.  He is here today to discuss PET scan. He is having more SOB, more cough w gray to yellow sputum. No real fevers. He is on breztri, albuterol nebs q4h. He is on prednisone '20mg'$  bid for the last 6 months.   PET scan 07/17/2021 reviewed by me shows hypermetabolic subcarinal lymph node, hypermetabolic 2.9 cm cavitary right lower lobe superior segmental nodule (not seen on scan from 3/22), right middle lobe 2.9 cm nodule increased in size from 1.6 cm on recent CT, decrease in size posterior medial  right lower lobe nodularity now 2.5 x 1.6 cm, medial anterior left upper lobe nodule 1.1 x 1.0 cm again decreased in size that is not hypermetabolic.    Review of Systems As per HPI  Past Medical History:  Diagnosis Date   COPD (chronic obstructive pulmonary disease) ([place])    with bullous emphysema.    Heavy cigarette smoker before 2003   pt claims only 10 cigs per day, never heavier amounts.    HLD (hyperlipidemia)    Hypertension    Schizophrenia ([place]) 06/28/2013   This is a chronic condition and he lives with family     Family History  Problem Relation Age of Onset   Diabetes Mellitus II Mother    Diabetes Mother    Heart disease Father    Stroke Maternal Aunt    CAD Neg Hx      Social History   Socioeconomic History   Marital status: Single    Spouse name: Not on file   Number of children: Not on file   Years of education: Not on file   Highest education level: Not on file  Occupational History   Occupation: disabled  Tobacco Use   Smoking status: Former    Packs/day: 1.00    Years: 41.00    Pack years: 41.00    Types: Cigarettes   Smokeless tobacco: Never   Tobacco comments:    Quit 7 - 8 months ago [facility] 07/17/21  Vaping Use   Vaping Use: Never used  Substance and Sexual Activity   Alcohol use: No   Drug use: No   Sexual activity: Yes  Other Topics Concern   Not on file  Social History Narrative   Not on file   Social Determinants of Health   Financial Resource Strain: Not on file  Food Insecurity: Not on file  Transportation Needs: Not on file  Physical Activity: Not on file  Stress: Not on file  Social Connections: Not on file  Intimate Partner Violence: Not on file     No Known Allergies   Outpatient Medications Prior to Visit  Medication Sig Dispense Refill   albuterol (PROVENTIL) (2.5 MG/3ML) 0.083% nebulizer solution Take 2.5 mg by nebulization every 4 (four) hours.     albuterol (VENTOLIN HFA) 108 (90 Base) MCG/ACT inhaler Inhale 2  puffs into the lungs every 4 (four) hours as needed for wheezing or shortness of breath.     amLODipine (NORVASC) 10 MG tablet Take 10 mg by mouth daily.     Ascorbic Acid (VITAMIN C PO) Take 1 tablet by mouth 3 (three) times a week.      atorvastatin (LIPITOR) 10 MG tablet Take 10 mg by mouth daily.     benzonatate (TESSALON) 200 MG capsule Take 1 capsule (200 mg total) by mouth 2 (two) times daily as needed for cough. 20 capsule 0   benztropine (COGENTIN) 2 MG tablet Take 1 tablet (2 mg total) by mouth 2 (two) times daily. 180 tablet 1   BREZTRI AEROSPHERE 160-9-4.8 MCG/ACT AERO Inhale 2 puffs into the lungs in the morning and at bedtime.     carvedilol (COREG) 3.125 MG tablet Take 1 tablet (3.125 mg total) by mouth 2 (two) times daily with a meal. (Patient taking differently: Take 1.5625-3.125 mg by mouth See admin instructions. Take one tablet by mouth daily in the morning and one-half tablet by mouth daily in the evening.)  0   cetirizine (ZYRTEC) 10 MG tablet Take 10 mg by mouth daily as needed for allergies or rhinitis.     cholecalciferol (VITAMIN D) 25 MCG (1000 UNIT) tablet Take 4,000 Units by mouth every evening.      clindamycin (CLEOCIN) 300 MG capsule Take 1 capsule (300 mg total) by mouth 4 (four) times daily. X 7 days 28 capsule 0   Cyanocobalamin (B-12 PO) Take 1 tablet by mouth every other day.     enalapril (VASOTEC) 10 MG tablet Take 10 mg by mouth daily.     famotidine (PEPCID) 40 MG tablet Take 40 mg by mouth 2 (two) times daily.     guaiFENesin-dextromethorphan (ROBITUSSIN DM) 100-10 MG/5ML syrup Take 10 mLs by mouth every 4 (four) hours as needed for cough. 236 mL 0   HYDROCODONE-CHLORPHENIRAMINE PO Take 5 mLs by mouth every 6 (six) hours as needed (cough and pain).     ipratropium (ATROVENT) 0.02 % nebulizer solution Take 0.5 mg by nebulization every 4 (four) hours.     ipratropium-albuterol (DUONEB) 0.5-2.5 (3) MG/3ML SOLN Take 3 mLs by nebulization every 4 (four) hours.      mirtazapine (REMERON) 30 MG tablet Take 1 tablet (30 mg total) by mouth at bedtime. 90 tablet 1   multivitamin (ONE-A-DAY MEN'S) TABS tablet Take 1 tablet by mouth daily.     OXYGEN Inhale 2 L/min into the lungs continuous.      polyethylene glycol (MIRALAX / GLYCOLAX) 17 g packet Take 17 g by mouth daily as needed for mild constipation.     predniSONE (  DELTASONE) 10 MG tablet Take 2 tablets (20 mg total) by mouth 2 (two) times daily with a meal. 12 tablet 0   risperiDONE (RISPERDAL) 3 MG tablet Take 1 tablet (3 mg total) by mouth 2 (two) times daily. 180 tablet 1   sodium chloride (OCEAN) 0.65 % SOLN nasal spray Place 1 spray into both nostrils as needed for congestion.     tamsulosin (FLOMAX) 0.4 MG CAPS capsule Take 0.4 mg by mouth at bedtime.     montelukast (SINGULAIR) 10 MG tablet Take 1 tablet (10 mg total) by mouth daily. (Patient not taking: No sig reported) 90 tablet 0   predniSONE (DELTASONE) 50 MG tablet Take 1 tablet (50 mg total) by mouth daily with breakfast. (Patient not taking: Reported on 08/08/2021) 5 tablet 0   No facility-administered medications prior to visit.        Objective:   Physical Exam Vitals:   08/08/21 1603  BP: 120/78  Pulse: 93  Temp: 98.4 F (36.9 C)  TempSrc: Oral  SpO2: 98%  Weight: 157 lb (71.2 kg)    Gen: Debilitated man in a wheelchair, well-nourished, in no distress, depressed affect  ENT: No lesions,  mouth clear,  oropharynx clear, no postnasal drip  Neck: No JVD, no stridor  Lungs: No use of accessory muscles, very distant, no crackles or wheezing on normal respiration, no wheeze on forced expiration  Cardiovascular: RRR, heart sounds normal, no murmur or gallops, no peripheral edema  Musculoskeletal: No deformities, no cyanosis or clubbing  Neuro: alert, awake, non focal  Skin: Warm, no lesions or rash      Assessment & Plan:  Pulmonary nodules PET scan reviewed.  Shows that there has been some evolution of the pulmonary  nodular disease that was noted on the CT scan chest 06/05/2021.  Some of the nodular foci are smaller, cavitary.  There is a new focus in the posterior right upper lobe that is hypermetabolic on PET.  The other foci do not show strong hypermetabolic activity.  Question whether this is an infectious process given the waxing and waning and the rapid onset in the posterior right upper lobe.  He has a significantly hypermetabolic subcarinal node, question malignancy versus infection.  I do not think he can tolerate endobronchial ultrasound or transbronchial biopsies given his debilitated state and his severe emphysema.  I will try to treat him for possible multifocal pneumonia with Augmentin, see if he gets clinical improvement.  Then check a repeat CT chest in about 1 month to look for radiographical resolution.  If the nodules, adenopathy persist then we will have to discuss options with patient, family, thoracic conference.  COPD (chronic obstructive pulmonary disease) ([place]) Please continue your Breztri 2 puffs twice a day.  Rinse and gargle after using. Use your albuterol nebulizer up to every 4 hours if needed for shortness of breath, chest tightness, wheezing. Use your albuterol inhaler 2 puffs if needed for shortness of breath. Prednisone 20 mg twice a day as you have been taking it   Baltazar Apo, MD, PhD 08/08/2021, 4:39 PM [facility] [phone] or if no answer before 7:00PM call [phone] For any issues after 7:00PM please call eLink [phone]

## 2021-08-08 NOTE — Assessment & Plan Note (Signed)
Please continue your Breztri 2 puffs twice a day.  Rinse and gargle after using. Use your albuterol nebulizer up to every 4 hours if needed for shortness of breath, chest tightness, wheezing. Use your albuterol inhaler 2 puffs if needed for shortness of breath. Prednisone 20 mg twice a day as you have been taking it

## 2021-08-12 LAB — QUANTIFERON-TB GOLD PLUS
Mitogen-NIL: >10 IU/mL
NIL: 0.03 IU/mL
QuantiFERON-TB Gold Plus: NEGATIVE
TB1-NIL: 0.01 IU/mL
TB2-NIL: 0 IU/mL

## 2021-08-21 ENCOUNTER — Telehealth: Payer: Self-pay | Admitting: Emergency Medicine

## 2021-08-21 NOTE — Telephone Encounter (Signed)
Called and spoke with patient and informed him that his blood work results were negative. Nothing further needed

## 2021-08-26 ENCOUNTER — Emergency Department (HOSPITAL_COMMUNITY): Payer: Medicare Other

## 2021-08-26 ENCOUNTER — Other Ambulatory Visit: Payer: Self-pay

## 2021-08-26 ENCOUNTER — Inpatient Hospital Stay (HOSPITAL_COMMUNITY)
Admission: EM | Admit: 2021-08-26 | Discharge: 2021-08-28 | DRG: 190 | Disposition: A | Discharge status: Home / Self Care | Payer: Medicare Other | Attending: Internal Medicine | Admitting: Internal Medicine

## 2021-08-26 DIAGNOSIS — I10 Essential (primary) hypertension: Secondary | ICD-10-CM | POA: Diagnosis not present

## 2021-08-26 DIAGNOSIS — R109 Unspecified abdominal pain: Secondary | ICD-10-CM | POA: Diagnosis not present

## 2021-08-26 DIAGNOSIS — Z823 Family history of stroke: Secondary | ICD-10-CM | POA: Diagnosis not present

## 2021-08-26 DIAGNOSIS — Z66 Do not resuscitate: Secondary | ICD-10-CM | POA: Diagnosis present

## 2021-08-26 DIAGNOSIS — Z7951 Long term (current) use of inhaled steroids: Secondary | ICD-10-CM | POA: Diagnosis not present

## 2021-08-26 DIAGNOSIS — K5909 Other constipation: Secondary | ICD-10-CM | POA: Diagnosis present

## 2021-08-26 DIAGNOSIS — K219 Gastro-esophageal reflux disease without esophagitis: Secondary | ICD-10-CM | POA: Diagnosis present

## 2021-08-26 DIAGNOSIS — F209 Schizophrenia, unspecified: Secondary | ICD-10-CM | POA: Diagnosis not present

## 2021-08-26 DIAGNOSIS — J9621 Acute and chronic respiratory failure with hypoxia: Secondary | ICD-10-CM | POA: Diagnosis present

## 2021-08-26 DIAGNOSIS — Z20822 Contact with and (suspected) exposure to covid-19: Secondary | ICD-10-CM | POA: Diagnosis present

## 2021-08-26 DIAGNOSIS — Z79899 Other long term (current) drug therapy: Secondary | ICD-10-CM

## 2021-08-26 DIAGNOSIS — Z833 Family history of diabetes mellitus: Secondary | ICD-10-CM

## 2021-08-26 DIAGNOSIS — R140 Abdominal distension (gaseous): Secondary | ICD-10-CM | POA: Diagnosis present

## 2021-08-26 DIAGNOSIS — J441 Chronic obstructive pulmonary disease with (acute) exacerbation: Secondary | ICD-10-CM | POA: Diagnosis present

## 2021-08-26 DIAGNOSIS — J439 Emphysema, unspecified: Secondary | ICD-10-CM | POA: Diagnosis not present

## 2021-08-26 DIAGNOSIS — Z9981 Dependence on supplemental oxygen: Secondary | ICD-10-CM

## 2021-08-26 DIAGNOSIS — Z8249 Family history of ischemic heart disease and other diseases of the circulatory system: Secondary | ICD-10-CM

## 2021-08-26 DIAGNOSIS — N400 Enlarged prostate without lower urinary tract symptoms: Secondary | ICD-10-CM | POA: Diagnosis present

## 2021-08-26 DIAGNOSIS — R062 Wheezing: Secondary | ICD-10-CM | POA: Diagnosis not present

## 2021-08-26 DIAGNOSIS — J9611 Chronic respiratory failure with hypoxia: Secondary | ICD-10-CM | POA: Diagnosis present

## 2021-08-26 DIAGNOSIS — J449 Chronic obstructive pulmonary disease, unspecified: Secondary | ICD-10-CM | POA: Diagnosis not present

## 2021-08-26 DIAGNOSIS — Z87891 Personal history of nicotine dependence: Secondary | ICD-10-CM

## 2021-08-26 DIAGNOSIS — J189 Pneumonia, unspecified organism: Secondary | ICD-10-CM

## 2021-08-26 DIAGNOSIS — R9431 Abnormal electrocardiogram [ECG] [EKG]: Secondary | ICD-10-CM | POA: Diagnosis not present

## 2021-08-26 DIAGNOSIS — E785 Hyperlipidemia, unspecified: Secondary | ICD-10-CM | POA: Diagnosis present

## 2021-08-26 DIAGNOSIS — R059 Cough, unspecified: Secondary | ICD-10-CM | POA: Diagnosis not present

## 2021-08-26 DIAGNOSIS — R0689 Other abnormalities of breathing: Secondary | ICD-10-CM | POA: Diagnosis not present

## 2021-08-26 DIAGNOSIS — R509 Fever, unspecified: Secondary | ICD-10-CM | POA: Diagnosis not present

## 2021-08-26 LAB — COMPREHENSIVE METABOLIC PANEL
ALT: 29 U/L (ref 0–44)
AST: 17 U/L (ref 15–41)
Albumin: 3.7 g/dL (ref 3.5–5.0)
Alkaline Phosphatase: 49 U/L (ref 38–126)
Anion gap: 12 (ref 5–15)
BUN: 7 mg/dL — ABNORMAL LOW (ref 8–23)
CO2: 24 mmol/L (ref 22–32)
Calcium: 9.5 mg/dL (ref 8.9–10.3)
Chloride: 103 mmol/L (ref 98–111)
Creatinine, Ser: 0.73 mg/dL (ref 0.61–1.24)
GFR, Estimated: >60 mL/min (ref >60)
Glucose, Bld: 102 mg/dL — ABNORMAL HIGH (ref 70–99)
Potassium: 4.1 mmol/L (ref 3.5–5.1)
Sodium: 139 mmol/L (ref 135–145)
Total Bilirubin: 0.3 mg/dL (ref 0.3–1.2)
Total Protein: 7.4 g/dL (ref 6.5–8.1)

## 2021-08-26 LAB — CBC WITH DIFFERENTIAL/PLATELET
Abs Immature Granulocytes: 0.04 10*3/uL (ref 0.00–0.07)
Basophils Absolute: 0.1 10*3/uL (ref 0.0–0.1)
Basophils Relative: 1 %
Eosinophils Absolute: 0.2 10*3/uL (ref 0.0–0.5)
Eosinophils Relative: 2 %
HCT: 43.6 % (ref 39.0–52.0)
Hemoglobin: 14.8 g/dL (ref 13.0–17.0)
Immature Granulocytes: 0 %
Lymphocytes Relative: 29 %
Lymphs Abs: 3 10*3/uL (ref 0.7–4.0)
MCH: 30.7 pg (ref 26.0–34.0)
MCHC: 33.9 g/dL (ref 30.0–36.0)
MCV: 90.5 fL (ref 80.0–100.0)
Monocytes Absolute: 1.1 10*3/uL — ABNORMAL HIGH (ref 0.1–1.0)
Monocytes Relative: 10 %
Neutro Abs: 6.1 10*3/uL (ref 1.7–7.7)
Neutrophils Relative %: 58 %
Platelets: 289 10*3/uL (ref 150–400)
RBC: 4.82 MIL/uL (ref 4.22–5.81)
RDW: 13.5 % (ref 11.5–15.5)
WBC: 10.4 10*3/uL (ref 4.0–10.5)
nRBC: 0 % (ref 0.0–0.2)

## 2021-08-26 LAB — APTT: aPTT: 29 seconds (ref 24–36)

## 2021-08-26 LAB — PROTIME-INR
INR: 0.9 (ref 0.8–1.2)
Prothrombin Time: 12.4 seconds (ref 11.4–15.2)

## 2021-08-26 LAB — LACTIC ACID, PLASMA
Lactic Acid, Venous: 2.4 mmol/L (ref 0.5–1.9)
Lactic Acid, Venous: 2.4 mmol/L (ref 0.5–1.9)

## 2021-08-26 MED ORDER — IPRATROPIUM-ALBUTEROL 0.5-2.5 (3) MG/3ML IN SOLN
3.0000 mL | Freq: Once | RESPIRATORY_TRACT | Status: AC
  Administered 2021-08-26: 3 mL via RESPIRATORY_TRACT
  Filled 2021-08-26: qty 3

## 2021-08-26 MED ORDER — SODIUM CHLORIDE 0.9 % IV BOLUS
1000.0000 mL | Freq: Once | INTRAVENOUS | Status: AC
  Administered 2021-08-26: 1000 mL via INTRAVENOUS

## 2021-08-26 MED ORDER — DEXAMETHASONE SODIUM PHOSPHATE 10 MG/ML IJ SOLN
10.0000 mg | Freq: Once | INTRAMUSCULAR | Status: AC
Start: 2021-08-26 — End: 2021-08-26
  Administered 2021-08-26: 10 mg via INTRAMUSCULAR
  Filled 2021-08-26: qty 1

## 2021-08-26 NOTE — ED Provider Notes (Signed)
[facility] EMERGENCY DEPARTMENT Provider Note   CSN: [id number] Arrival date & time: 08/26/21  1440     History  Chief Complaint  Patient presents with   Shortness of Breath    Blake Mcknight is a 66 y.o. male with a past medical history of COPD who presents to the emergency department complaining of worsening shortness of breath onset last night.  Notes he has had issues with his breathing for the past year.  His lung doctor is Dr. Katherine Roan.  He tried his inhaler at home prior to arrival this morning and noted improvement of his symptoms with the inhaler.  Also noted improvement of his symptoms with the treatment regimen given in the emergency department.  Denies nausea, vomiting, chest pain at this time.  Patient notes that he typically wears 2 L of oxygen at nighttime.   The history is provided by the patient. No language interpreter was used.       Home Medications Prior to Admission medications   Medication Sig Start Date End Date Taking? Authorizing Provider  albuterol (PROVENTIL) (2.5 MG/3ML) 0.083% nebulizer solution Take 2.5 mg by nebulization every 4 (four) hours.    [provider]  albuterol (VENTOLIN HFA) 108 (90 Base) MCG/ACT inhaler Inhale 2 puffs into the lungs every 4 (four) hours as needed for wheezing or shortness of breath. 02/20/19   Aline August, MD  amLODipine (NORVASC) 10 MG tablet Take 10 mg by mouth daily. 06/26/21   [provider]  amoxicillin-clavulanate (AUGMENTIN) 875-125 MG tablet Take 1 tablet by mouth 2 (two) times daily. 08/08/21   Collene Gobble, MD  Ascorbic Acid (VITAMIN C PO) Take 1 tablet by mouth 3 (three) times a week.     [provider]  atorvastatin (LIPITOR) 10 MG tablet Take 10 mg by mouth daily. 09/21/15   [provider]  benzonatate (TESSALON) 200 MG capsule Take 1 capsule (200 mg total) by mouth 2 (two) times daily as needed for cough. 09/05/20   Mariel Aloe, MD   benztropine (COGENTIN) 2 MG tablet Take 1 tablet (2 mg total) by mouth 2 (two) times daily. 07/23/21   Arfeen, Arlyce Harman, MD  BREZTRI AEROSPHERE 160-9-4.8 MCG/ACT AERO Inhale 2 puffs into the lungs in the morning and at bedtime. 07/18/20   [provider]  carvedilol (COREG) 3.125 MG tablet Take 1 tablet (3.125 mg total) by mouth 2 (two) times daily with a meal. Patient taking differently: Take 1.5625-3.125 mg by mouth See admin instructions. Take one tablet by mouth daily in the morning and one-half tablet by mouth daily in the evening. 02/20/19   Aline August, MD  cetirizine (ZYRTEC) 10 MG tablet Take 10 mg by mouth daily as needed for allergies or rhinitis.    [provider]  cholecalciferol (VITAMIN D) 25 MCG (1000 UNIT) tablet Take 4,000 Units by mouth every evening.     [provider]  clindamycin (CLEOCIN) 300 MG capsule Take 1 capsule (300 mg total) by mouth 4 (four) times daily. X 7 days 03/08/21   Veryl Speak, MD  Cyanocobalamin (B-12 PO) Take 1 tablet by mouth every other day.    [provider]  enalapril (VASOTEC) 10 MG tablet Take 10 mg by mouth daily. 06/04/16   [provider]  famotidine (PEPCID) 40 MG tablet Take 40 mg by mouth 2 (two) times daily. 12/28/18   [provider]  guaiFENesin-dextromethorphan (ROBITUSSIN DM) 100-10 MG/5ML syrup Take 10 mLs by mouth  every 4 (four) hours as needed for cough. 02/20/19   Aline August, MD  HYDROCODONE-CHLORPHENIRAMINE PO Take 5 mLs by mouth every 6 (six) hours as needed (cough and pain).    [provider]  ipratropium (ATROVENT) 0.02 % nebulizer solution Take 0.5 mg by nebulization every 4 (four) hours.    [provider]  ipratropium-albuterol (DUONEB) 0.5-2.5 (3) MG/3ML SOLN Take 3 mLs by nebulization every 4 (four) hours. 04/27/21   [provider]  mirtazapine (REMERON) 30 MG tablet Take 1 tablet (30 mg total) by mouth at bedtime. 07/23/21 07/23/22  Arfeen, Arlyce Harman,  MD  montelukast (SINGULAIR) 10 MG tablet Take 1 tablet (10 mg total) by mouth daily. Patient not taking: No sig reported 06/23/19 09/21/19  Harvie Heck, MD  multivitamin (ONE-A-DAY MEN'S) TABS tablet Take 1 tablet by mouth daily.    [provider]  OXYGEN Inhale 2 L/min into the lungs continuous.     [provider]  polyethylene glycol (MIRALAX / GLYCOLAX) 17 g packet Take 17 g by mouth daily as needed for mild constipation.    [provider]  predniSONE (DELTASONE) 10 MG tablet Take 2 tablets (20 mg total) by mouth 2 (two) times daily with a meal. 03/08/21   Veryl Speak, MD  predniSONE (DELTASONE) 50 MG tablet Take 1 tablet (50 mg total) by mouth daily with breakfast. Patient not taking: Reported on 08/08/2021 07/09/21   Isla Pence, MD  risperiDONE (RISPERDAL) 3 MG tablet Take 1 tablet (3 mg total) by mouth 2 (two) times daily. 07/23/21   Arfeen, Arlyce Harman, MD  sodium chloride (OCEAN) 0.65 % SOLN nasal spray Place 1 spray into both nostrils as needed for congestion.    [provider]  tamsulosin (FLOMAX) 0.4 MG CAPS capsule Take 0.4 mg by mouth at bedtime. 07/23/20   [provider]      Allergies    Patient has no known allergies.    Review of Systems   Review of Systems  Respiratory:  Positive for shortness of breath.   Cardiovascular:  Negative for chest pain.  Gastrointestinal:  Negative for nausea and vomiting.  All other systems reviewed and are negative.   Physical Exam Updated Vital Signs BP (!) 152/84 (BP Location: Left Arm)   Pulse 96   Temp 98.4 F (36.9 C) (Oral)   Resp 18   SpO2 99%  Physical Exam Vitals and nursing note reviewed.  Constitutional:      General: He is not in acute distress.    Appearance: He is not diaphoretic.     Comments: [place] in place, 2L  HENT:     Head: Normocephalic and atraumatic.     Mouth/Throat:     Pharynx: No oropharyngeal exudate.  Eyes:     General: No scleral icterus.     Conjunctiva/sclera: Conjunctivae normal.  Cardiovascular:     Rate and Rhythm: Normal rate and regular rhythm.     Pulses: Normal pulses.     Heart sounds: Normal heart sounds.  Pulmonary:     Effort: Pulmonary effort is normal. No respiratory distress.     Breath sounds: Wheezing and rales present.     Comments: Wheezing noted throughout all lung fields. Abdominal:     General: Bowel sounds are normal.     Palpations: Abdomen is soft. There is no mass.     Tenderness: There is no abdominal tenderness. There is no guarding or rebound.  Musculoskeletal:  General: Normal range of motion.     Cervical back: Normal range of motion and neck supple.  Skin:    General: Skin is warm and dry.  Neurological:     Mental Status: He is alert.  Psychiatric:        Behavior: Behavior normal.     ED Results / Procedures / Treatments   Labs (all labs ordered are listed, but only abnormal results are displayed) Labs Reviewed  LACTIC ACID, PLASMA - Abnormal; Notable for the following components:      Result Value   Lactic Acid, Venous 2.4 (*)    All other components within normal limits  LACTIC ACID, PLASMA - Abnormal; Notable for the following components:   Lactic Acid, Venous 2.4 (*)    All other components within normal limits  COMPREHENSIVE METABOLIC PANEL - Abnormal; Notable for the following components:   Glucose, Bld 102 (*)    BUN 7 (*)    All other components within normal limits  CBC WITH DIFFERENTIAL/PLATELET - Abnormal; Notable for the following components:   Monocytes Absolute 1.1 (*)    All other components within normal limits  CULTURE, BLOOD (ROUTINE X 2)  CULTURE, BLOOD (ROUTINE X 2)  URINE CULTURE  PROTIME-INR  APTT  URINALYSIS, ROUTINE W REFLEX MICROSCOPIC    EKG EKG Interpretation  Date/Time:  Monday August 26 2021 14:43:05 EDT Ventricular Rate:  113 PR Interval:  136 QRS Duration: 76 QT Interval:  298 QTC Calculation: 408 R Axis:   90 Text  Interpretation: Sinus tachycardia Rightward axis Borderline ECG When compared with ECG of 09-Jul-2021 09:21, PREVIOUS ECG IS PRESENT Confirmed by Dene Gentry [phone]) on 08/26/2021 11:03:19 PM  Radiology DG Chest 2 View  Result Date: 08/26/2021 CLINICAL DATA:  Shortness of breath, productive cough, fever EXAM: CHEST - 2 VIEW COMPARISON:  07/09/2021 FINDINGS: Cardiac size is within normal limits. COPD with bullous emphysema is seen. There is linear patchy infiltrate in the medial right lower lung fields suggesting scarring and possibly superimposed atelectasis/pneumonia. There are other thin linear densities in both lungs suggesting scarring. There is no pleural effusion or pneumothorax. IMPRESSION: Severe COPD with bullous emphysema. There is linear patchy infiltrate in the medial right lower lung fields suggesting scarring and possibly superimposed atelectasis/pneumonia. Electronically Signed   By: Elmer Picker M.D.   On: 08/26/2021 15:50    Procedures Procedures    Medications Ordered in ED Medications  ipratropium-albuterol (DUONEB) 0.5-2.5 (3) MG/3ML nebulizer solution 3 mL (3 mLs Nebulization Given 08/26/21 1819)    ED Course/ Medical Decision Making/ A&P Clinical Course as of 08/26/21 2328  Mon Aug 26, 2021  2257 Patient re-evaluated and noted that he feels improvement of his symptoms with treatment regimen. Pt still with wheezing and lungs sounding tight on repeat ausculation.  [SB]  2309 Re-evaluated and would stay for admission.  [SB]  2313 Consult to hospital, Dr. Marlowe Sax who will evaluate the patient for admission. [SB]    Clinical Course User Index [SB] Sinjin Amero A, PA-C                           Medical Decision Making Risk Prescription drug management. Decision regarding hospitalization.   Patient presents to the ED complaining of shortness of breath worsening yesterday.  Tried his inhaler at home with mild relief of his symptoms.  Wears 2 L of oxygen at  nighttime.  Vital signs patient afebrile.  On exam patient with wheezing  noted throughout lungs.  Rales noted to bilateral bases. No acute cardiovascular exam findings. Differential diagnosis includes COPD exacerbation, PTX, PNA.   Additional history obtained:  Additional history obtained from wife   Labs:  I ordered, and personally interpreted labs.  The pertinent results include:   Coags unremarkable. Initial lactic at 2.4, repeat lactic at 2.4. Blood cultures ordered with results pending at time of admission. Urinalysis ordered with results pending at time of admission. CMP overall unremarkable. CBC unremarkable  Imaging: I ordered imaging studies including chest x-ray I independently visualized and interpreted imaging which showed  Severe COPD with bullous emphysema. There is linear patchy  infiltrate in the medial right lower lung fields suggesting scarring  and possibly superimposed atelectasis/pneumonia.   I agree with the radiologist interpretation  Medications:  I ordered medication including Decadron and DuoNeb for symptom management Reevaluation of the patient after these medicines showed that the patient improved I have reviewed the patients home medicines and have made adjustments as needed   Consultations: I requested consultation with the hospitalist, Dr. Marlowe Sax,  and discussed lab and imaging findings as well as pertinent plan - they recommend: We will evaluate patient in the ED for admission    Disposition: Presentation suspicious for COPD exacerbation.  Doubt PTX, pneumonia at this time. After consideration of the diagnostic results and the patients response to treatment, I feel that the patient would benefit from admission to the hospital.  Discussed admission plans with patient and significant other at bedside.  Patient agreeable at this time.  Patient appears safe for admission.   This chart was dictated using voice recognition software, Dragon. Despite the  best efforts of this provider to proofread and correct errors, errors may still occur which can change documentation meaning.  Final Clinical Impression(s) / ED Diagnoses Final diagnoses:  COPD exacerbation [facility])    Rx / DC Orders ED Discharge Orders     None         Normagene Harvie A, PA-C 08/26/21 2345    Valarie Merino, MD 08/27/21 [phone]

## 2021-08-26 NOTE — ED Triage Notes (Signed)
Pt with sob today. Attempted to use home nebulizer x 2 without improvement. Denies chest pain. Duoneb x 2 given by EMS with some improvement. Cough with green and brown sputum. Wears 2 L home O2.

## 2021-08-26 NOTE — ED Provider Triage Note (Signed)
Emergency Medicine Provider Triage Evaluation Note  Blake Mcknight , a 66 y.o. male  was evaluated in triage.  Pt complains of increasing shortness of breath and colored sputum over the last few days, with weakness.  Hx of chronic shortness of breath and cough over the last 2 years.  Denies recent fevers.  Denies chest pain.  On 2 L O2 in the room.  Review of Systems  Positive:  Negative: As above  Physical Exam  BP 106/85 (BP Location: Left Arm)   Pulse (!) 114   Temp 98 F (36.7 C) (Oral)   Resp (!) 26   SpO2 100%  Gen:   Awake, no distress   Resp:  Normal effort, Rales appreciated bilaterally MSK:   Moves extremities without difficulty  Other:  Tachycardic 114, tachypneic 26.  Abdomen soft nontender.  Chest non-TTP.  Medical Decision Making  Medically screening exam initiated at 3:18 PM.  Appropriate orders placed.  Blake Mcknight was informed that the remainder of the evaluation will be completed by another provider, this initial triage assessment does not replace that evaluation, and the importance of remaining in the ED until their evaluation is complete.  Patient meets sepsis criteria, sepsis work-up initiated.   Prince Rome, PA-C 83/09/40 [phone]

## 2021-08-27 ENCOUNTER — Ambulatory Visit: Payer: Self-pay | Admitting: *Deleted

## 2021-08-27 ENCOUNTER — Observation Stay (HOSPITAL_COMMUNITY): Payer: Medicare Other

## 2021-08-27 DIAGNOSIS — R109 Unspecified abdominal pain: Secondary | ICD-10-CM | POA: Diagnosis not present

## 2021-08-27 DIAGNOSIS — Z20822 Contact with and (suspected) exposure to covid-19: Secondary | ICD-10-CM | POA: Diagnosis present

## 2021-08-27 DIAGNOSIS — Z8249 Family history of ischemic heart disease and other diseases of the circulatory system: Secondary | ICD-10-CM | POA: Diagnosis not present

## 2021-08-27 DIAGNOSIS — Z9981 Dependence on supplemental oxygen: Secondary | ICD-10-CM | POA: Diagnosis not present

## 2021-08-27 DIAGNOSIS — Z833 Family history of diabetes mellitus: Secondary | ICD-10-CM | POA: Diagnosis not present

## 2021-08-27 DIAGNOSIS — J189 Pneumonia, unspecified organism: Secondary | ICD-10-CM

## 2021-08-27 DIAGNOSIS — Z823 Family history of stroke: Secondary | ICD-10-CM | POA: Diagnosis not present

## 2021-08-27 DIAGNOSIS — I10 Essential (primary) hypertension: Secondary | ICD-10-CM | POA: Diagnosis present

## 2021-08-27 DIAGNOSIS — J441 Chronic obstructive pulmonary disease with (acute) exacerbation: Secondary | ICD-10-CM | POA: Diagnosis not present

## 2021-08-27 DIAGNOSIS — Z66 Do not resuscitate: Secondary | ICD-10-CM | POA: Diagnosis present

## 2021-08-27 DIAGNOSIS — Z79899 Other long term (current) drug therapy: Secondary | ICD-10-CM | POA: Diagnosis not present

## 2021-08-27 DIAGNOSIS — J439 Emphysema, unspecified: Secondary | ICD-10-CM | POA: Diagnosis present

## 2021-08-27 DIAGNOSIS — Z87891 Personal history of nicotine dependence: Secondary | ICD-10-CM | POA: Diagnosis not present

## 2021-08-27 DIAGNOSIS — E785 Hyperlipidemia, unspecified: Secondary | ICD-10-CM | POA: Diagnosis present

## 2021-08-27 DIAGNOSIS — Z7951 Long term (current) use of inhaled steroids: Secondary | ICD-10-CM | POA: Diagnosis not present

## 2021-08-27 DIAGNOSIS — N400 Enlarged prostate without lower urinary tract symptoms: Secondary | ICD-10-CM | POA: Diagnosis present

## 2021-08-27 DIAGNOSIS — K5909 Other constipation: Secondary | ICD-10-CM | POA: Diagnosis present

## 2021-08-27 DIAGNOSIS — J9621 Acute and chronic respiratory failure with hypoxia: Secondary | ICD-10-CM | POA: Diagnosis present

## 2021-08-27 DIAGNOSIS — R140 Abdominal distension (gaseous): Secondary | ICD-10-CM | POA: Diagnosis present

## 2021-08-27 DIAGNOSIS — K219 Gastro-esophageal reflux disease without esophagitis: Secondary | ICD-10-CM | POA: Diagnosis present

## 2021-08-27 DIAGNOSIS — F209 Schizophrenia, unspecified: Secondary | ICD-10-CM | POA: Diagnosis not present

## 2021-08-27 LAB — URINALYSIS, ROUTINE W REFLEX MICROSCOPIC
Bilirubin Urine: NEGATIVE
Glucose, UA: NEGATIVE mg/dL
Hgb urine dipstick: NEGATIVE
Ketones, ur: NEGATIVE mg/dL
Leukocytes,Ua: NEGATIVE
Nitrite: NEGATIVE
Protein, ur: NEGATIVE mg/dL
Specific Gravity, Urine: 1.011 (ref 1.005–1.030)
pH: 6 (ref 5.0–8.0)

## 2021-08-27 LAB — URINE CULTURE: Culture: NO GROWTH

## 2021-08-27 LAB — PROCALCITONIN: Procalcitonin: <0.1 ng/mL

## 2021-08-27 LAB — HIV ANTIBODY (ROUTINE TESTING W REFLEX): HIV Screen 4th Generation wRfx: NONREACTIVE

## 2021-08-27 LAB — SARS CORONAVIRUS 2 BY RT PCR: SARS Coronavirus 2 by RT PCR: NEGATIVE

## 2021-08-27 MED ORDER — ALBUTEROL SULFATE (2.5 MG/3ML) 0.083% IN NEBU
2.5000 mg | INHALATION_SOLUTION | RESPIRATORY_TRACT | Status: DC | PRN
  Administered 2021-08-27 – 2021-08-28 (×3): 2.5 mg via RESPIRATORY_TRACT
  Filled 2021-08-27 (×3): qty 3

## 2021-08-27 MED ORDER — CARVEDILOL 3.125 MG PO TABS
3.1250 mg | ORAL_TABLET | Freq: Every day | ORAL | Status: DC
  Administered 2021-08-27 – 2021-08-28 (×2): 3.125 mg via ORAL
  Filled 2021-08-27 (×2): qty 1

## 2021-08-27 MED ORDER — IPRATROPIUM-ALBUTEROL 0.5-2.5 (3) MG/3ML IN SOLN
3.0000 mL | Freq: Three times a day (TID) | RESPIRATORY_TRACT | Status: DC
  Administered 2021-08-27 (×3): 3 mL via RESPIRATORY_TRACT
  Filled 2021-08-27 (×3): qty 3

## 2021-08-27 MED ORDER — AMLODIPINE BESYLATE 10 MG PO TABS
10.0000 mg | ORAL_TABLET | Freq: Every day | ORAL | Status: DC
  Administered 2021-08-27 – 2021-08-28 (×2): 10 mg via ORAL
  Filled 2021-08-27: qty 2
  Filled 2021-08-27: qty 1

## 2021-08-27 MED ORDER — TAMSULOSIN HCL 0.4 MG PO CAPS
0.8000 mg | ORAL_CAPSULE | Freq: Every day | ORAL | Status: DC
  Administered 2021-08-27: 0.8 mg via ORAL
  Filled 2021-08-27: qty 2

## 2021-08-27 MED ORDER — BUDESONIDE 0.25 MG/2ML IN SUSP
0.2500 mg | Freq: Two times a day (BID) | RESPIRATORY_TRACT | Status: DC
  Administered 2021-08-27 – 2021-08-28 (×3): 0.25 mg via RESPIRATORY_TRACT
  Filled 2021-08-27 (×3): qty 2

## 2021-08-27 MED ORDER — AZITHROMYCIN 500 MG IV SOLR
500.0000 mg | INTRAVENOUS | Status: DC
  Administered 2021-08-27 – 2021-08-28 (×2): 500 mg via INTRAVENOUS
  Filled 2021-08-27 (×3): qty 5

## 2021-08-27 MED ORDER — ACETAMINOPHEN 325 MG PO TABS: 650.0000 mg | ORAL_TABLET | Freq: Four times a day (QID) | ORAL | Status: DC | PRN

## 2021-08-27 MED ORDER — SODIUM CHLORIDE 0.9 % IV SOLN
1.0000 g | INTRAVENOUS | Status: DC
  Administered 2021-08-27: 1 g via INTRAVENOUS
  Filled 2021-08-27: qty 10

## 2021-08-27 MED ORDER — ATORVASTATIN CALCIUM 10 MG PO TABS
10.0000 mg | ORAL_TABLET | Freq: Every day | ORAL | Status: DC
  Administered 2021-08-27: 10 mg via ORAL
  Filled 2021-08-27: qty 1

## 2021-08-27 MED ORDER — CARVEDILOL 3.125 MG PO TABS
1.5625 mg | ORAL_TABLET | Freq: Every day | ORAL | Status: DC
  Administered 2021-08-27 – 2021-08-28 (×2): 1.5625 mg via ORAL
  Filled 2021-08-27 (×2): qty 1

## 2021-08-27 MED ORDER — FAMOTIDINE 20 MG PO TABS
40.0000 mg | ORAL_TABLET | Freq: Two times a day (BID) | ORAL | Status: DC
Start: 2021-08-27 — End: 2021-08-27

## 2021-08-27 MED ORDER — IPRATROPIUM-ALBUTEROL 0.5-2.5 (3) MG/3ML IN SOLN
3.0000 mL | RESPIRATORY_TRACT | Status: DC
  Administered 2021-08-28 (×4): 3 mL via RESPIRATORY_TRACT
  Filled 2021-08-27 (×5): qty 3

## 2021-08-27 MED ORDER — GUAIFENESIN ER 600 MG PO TB12
600.0000 mg | ORAL_TABLET | Freq: Two times a day (BID) | ORAL | Status: DC
Start: 2021-08-27 — End: 2021-08-28
  Administered 2021-08-27 – 2021-08-28 (×4): 600 mg via ORAL
  Filled 2021-08-27 (×4): qty 1

## 2021-08-27 MED ORDER — PREDNISONE 50 MG PO TABS
60.0000 mg | ORAL_TABLET | Freq: Every day | ORAL | Status: DC
  Administered 2021-08-27 – 2021-08-28 (×2): 60 mg via ORAL
  Filled 2021-08-27: qty 1
  Filled 2021-08-27: qty 3

## 2021-08-27 MED ORDER — MIRTAZAPINE 15 MG PO TABS
30.0000 mg | ORAL_TABLET | Freq: Every day | ORAL | Status: DC
  Administered 2021-08-27: 30 mg via ORAL
  Filled 2021-08-27: qty 2

## 2021-08-27 MED ORDER — ENOXAPARIN SODIUM 40 MG/0.4ML IJ SOSY
40.0000 mg | PREFILLED_SYRINGE | INTRAMUSCULAR | Status: DC
  Administered 2021-08-27 – 2021-08-28 (×2): 40 mg via SUBCUTANEOUS
  Filled 2021-08-27 (×2): qty 0.4

## 2021-08-27 MED ORDER — ALBUTEROL SULFATE (2.5 MG/3ML) 0.083% IN NEBU: 2.5000 mg | INHALATION_SOLUTION | Freq: Four times a day (QID) | RESPIRATORY_TRACT | Status: DC | PRN

## 2021-08-27 MED ORDER — BENZTROPINE MESYLATE 2 MG PO TABS
2.0000 mg | ORAL_TABLET | Freq: Two times a day (BID) | ORAL | Status: DC
  Administered 2021-08-27 – 2021-08-28 (×3): 2 mg via ORAL
  Filled 2021-08-27 (×5): qty 1

## 2021-08-27 MED ORDER — RISPERIDONE 3 MG PO TABS
3.0000 mg | ORAL_TABLET | Freq: Two times a day (BID) | ORAL | Status: DC
  Administered 2021-08-27 – 2021-08-28 (×3): 3 mg via ORAL
  Filled 2021-08-27 (×5): qty 1

## 2021-08-27 MED ORDER — ACETAMINOPHEN 650 MG RE SUPP: 650.0000 mg | Freq: Four times a day (QID) | RECTAL | Status: DC | PRN

## 2021-08-27 NOTE — Plan of Care (Signed)

## 2021-08-27 NOTE — ED Notes (Signed)
After pt spoke with MD Marlowe Sax, pt agreed on this RN placing another IV.

## 2021-08-27 NOTE — ED Notes (Signed)
This RN was rounding and noticed pt's IV on bedside table. Pt was asked what happened and why the IV was out of his arm. Pt stated he "doesn't want it anymore and don't want another one".

## 2021-08-27 NOTE — H&P (Signed)
History and Physical    Blake Mcknight TOI:[id number] DOB: 17-Apr-1955 DOA: 08/26/2021  PCP: [name], MD  Patient coming from: Home  Chief Complaint: Shortness of breath  HPI: Blake Mcknight is a 66 y.o. male with medical history significant of COPD, chronic hypoxemic respiratory failure on 2 L O2, former tobacco use, hypertension, hyperlipidemia, schizophrenia, GERD, BPH presented to the ED via EMS complaining of shortness of breath and cough.  EMS gave DuoNeb x2 with some improvement.  Afebrile.  Satting well on 2 L O2.  Labs showing WBC 10.4, hemoglobin 14.8, platelet count 289k.  Sodium 139, potassium 4.1, chloride 103, bicarb 24, BUN 7, creatinine 0.7, glucose 102.  Lactic acid 2.4 > 2.4.  Blood cultures drawn.  UA without signs of infection.  Chest x-ray showing severe COPD with bullous emphysema.  There is a linear patchy infiltrate in the medial right lower lung fields suggesting scarring and possibly superimposed atelectasis/pneumonia.  EKG without acute ischemic changes.  Patient was given IV Decadron 10 mg, DuoNeb x2, and 1 L normal saline bolus in the ED.  History provided by patient and his sister at bedside.  Reporting 4-day history of progressively worsening dyspnea and cough productive of yellow to green sputum.  No fevers.  Patient denies chest pain.  He is using Beztri 2 puffs in the morning and 2 puffs at night.  Also using DuoNeb and sister is concerned that for the past few days he has required it every 2 to 4 hours.  Per sister, he is supposed to be on 2 L O2 all the time but uses it only at night and keeps taking it off during the day.  Sister is concerned that the patient's abdomen looks distended and might be making his breathing worse.  Reports patient having chronic constipation for which he is receiving over-the-counter laxatives.  No nausea or vomiting; eating meals.  Review of Systems:  Review of Systems  All other systems reviewed and are  negative.   Past Medical History:  Diagnosis Date   COPD (chronic obstructive pulmonary disease) ([place])    with bullous emphysema.    Heavy cigarette smoker before 2003   pt claims only 10 cigs per day, never heavier amounts.    HLD (hyperlipidemia)    Hypertension    Schizophrenia ([place]) 06/28/2013   This is a chronic condition and he lives with family    Past Surgical History:  Procedure Laterality Date   CHOLECYSTECTOMY N/A 06/06/2013   Procedure: LAPAROSCOPIC CHOLECYSTECTOMY WITH ATTEMPTED INTRAOPERATIVE CHOLANGIOGRAM;  Surgeon: Zenovia Jarred, MD;  Location: [place];  Service: General;  Laterality: N/A;   ERCP N/A 06/03/2013   Procedure: ENDOSCOPIC RETROGRADE CHOLANGIOPANCREATOGRAPHY (ERCP);  Surgeon: Ladene Artist, MD;  Location: [facility]COPY;  Service: Endoscopy;  Laterality: N/A;     reports that he has quit smoking. His smoking use included cigarettes. He has a 41.00 pack-year smoking history. He has never used smokeless tobacco. He reports that he does not drink alcohol and does not use drugs.  No Known Allergies  Family History  Problem Relation Age of Onset   Diabetes Mellitus II Mother    Diabetes Mother    Heart disease Father    Stroke Maternal Aunt    CAD Neg Hx     Prior to Admission medications   Medication Sig Start Date End Date Taking? Authorizing Provider  albuterol (VENTOLIN HFA) 108 (90 Base) MCG/ACT inhaler Inhale 2 puffs into the lungs every 4 (four) hours  as needed for wheezing or shortness of breath. 02/20/19  Yes Aline August, MD  amLODipine (NORVASC) 10 MG tablet Take 10 mg by mouth daily. 06/26/21  Yes [provider]  atorvastatin (LIPITOR) 10 MG tablet Take 10 mg by mouth at bedtime. 09/21/15  Yes [provider]  benzonatate (TESSALON) 200 MG capsule Take 1 capsule (200 mg total) by mouth 2 (two) times daily as needed for cough. 09/05/20  Yes Mariel Aloe, MD  benztropine (COGENTIN) 2 MG tablet Take 1 tablet (2 mg total) by  mouth 2 (two) times daily. 07/23/21  Yes Arfeen, Arlyce Harman, MD  BREZTRI AEROSPHERE 160-9-4.8 MCG/ACT AERO Inhale 2 puffs into the lungs in the morning and at bedtime. 07/18/20  Yes [provider]  carvedilol (COREG) 3.125 MG tablet Take 1 tablet (3.125 mg total) by mouth 2 (two) times daily with a meal. Patient taking differently: Take 1.5625-3.125 mg by mouth See admin instructions. Take one tablet by mouth daily in the morning and one-half tablet by mouth daily in the evening. 02/20/19  Yes Aline August, MD  castor oil liquid Take 10 mLs by mouth daily as needed for moderate constipation or mild constipation.   Yes [provider]  cetirizine (ZYRTEC) 10 MG tablet Take 10 mg by mouth daily as needed for allergies or rhinitis.   Yes [provider]  cholecalciferol (VITAMIN D) 25 MCG (1000 UNIT) tablet Take 4,000 Units by mouth every evening.    Yes [provider]  famotidine (PEPCID) 40 MG tablet Take 40 mg by mouth 2 (two) times daily. 12/28/18  Yes [provider]  HYDROCODONE-CHLORPHENIRAMINE PO Take 5 mLs by mouth every 6 (six) hours as needed (cough and pain).   Yes [provider]  ipratropium-albuterol (DUONEB) 0.5-2.5 (3) MG/3ML SOLN Take 3 mLs by nebulization every 4 (four) hours. 04/27/21  Yes [provider]  MILK THISTLE PO Take 1 capsule by mouth daily.   Yes [provider]  mirtazapine (REMERON) 30 MG tablet Take 1 tablet (30 mg total) by mouth at bedtime. 07/23/21 07/23/22 Yes Arfeen, Arlyce Harman, MD  multivitamin (ONE-A-DAY MEN'S) TABS tablet Take 1 tablet by mouth daily.   Yes [provider]  OVER THE COUNTER MEDICATION Take 2-4 tablets by mouth daily as needed (constipation). Oxy powder   Yes [provider]  OXYGEN Inhale 2 L/min into the lungs continuous.    Yes [provider]  polyethylene glycol (MIRALAX / GLYCOLAX) 17 g packet Take 17 g by mouth daily as needed for mild constipation.   Yes  [provider]  predniSONE (DELTASONE) 10 MG tablet Take 2 tablets (20 mg total) by mouth 2 (two) times daily with a meal. Patient taking differently: Take 10 mg by mouth 2 (two) times daily with a meal. 03/08/21  Yes Delo, Nathaneil Canary, MD  risperiDONE (RISPERDAL) 3 MG tablet Take 1 tablet (3 mg total) by mouth 2 (two) times daily. 07/23/21  Yes Arfeen, Arlyce Harman, MD  sodium chloride (OCEAN) 0.65 % SOLN nasal spray Place 1 spray into both nostrils as needed for congestion.   Yes [provider]  tamsulosin (FLOMAX) 0.4 MG CAPS capsule Take 0.8 mg by mouth at bedtime. 07/23/20  Yes [provider]  amoxicillin-clavulanate (AUGMENTIN) 875-125 MG tablet Take 1 tablet by mouth 2 (two) times daily. Patient not taking: Reported on 08/26/2021 08/08/21   Collene Gobble, MD  montelukast (SINGULAIR) 10 MG tablet Take 1 tablet (10 mg total) by mouth daily.  Patient not taking: Reported on 09/04/2020 06/23/19 09/21/19  Harvie Heck, MD  predniSONE (DELTASONE) 50 MG tablet Take 1 tablet (50 mg total) by mouth daily with breakfast. Patient not taking: Reported on 08/08/2021 07/09/21   Isla Pence, MD    Physical Exam: Vitals:   08/26/21 1449 08/26/21 1937 08/26/21 2154 08/27/21 0030  BP: 106/85 (!) 152/84 (!) 156/97   Pulse: (!) 114 96 99 99  Resp: (!) 26 18 (!) 35 (!) 22  Temp: 98 F (36.7 C) 98.4 F (36.9 C)    TempSrc: Oral Oral    SpO2: 100% 99% 100% 97%    Physical Exam Vitals reviewed.  Constitutional:      General: He is not in acute distress. HENT:     Head: Normocephalic and atraumatic.  Eyes:     Extraocular Movements: Extraocular movements intact.  Cardiovascular:     Rate and Rhythm: Normal rate and regular rhythm.     Pulses: Normal pulses.  Pulmonary:     Effort: Pulmonary effort is normal. No respiratory distress.     Breath sounds: No wheezing.     Comments: Crackles appreciated at the right lung base Abdominal:     General: Bowel sounds are normal. There is  distension.     Palpations: Abdomen is soft.     Tenderness: There is no abdominal tenderness. There is no guarding or rebound.     Comments: Abdomen slightly distended but soft  Musculoskeletal:        General: No swelling or tenderness.     Cervical back: Normal range of motion.  Skin:    General: Skin is warm and dry.  Neurological:     General: No focal deficit present.     Mental Status: He is alert and oriented to person, place, and time.      Labs on Admission: I have personally reviewed following labs and imaging studies  CBC: Recent Labs  Lab 08/26/21 1521  WBC 10.4  NEUTROABS 6.1  HGB 14.8  HCT 43.6  MCV 90.5  PLT 433   Basic Metabolic Panel: Recent Labs  Lab 08/26/21 1521  NA 139  K 4.1  CL 103  CO2 24  GLUCOSE 102*  BUN 7*  CREATININE 0.73  CALCIUM 9.5   GFR: CrCl cannot be calculated (Unknown ideal weight.). Liver Function Tests: Recent Labs  Lab 08/26/21 1521  AST 17  ALT 29  ALKPHOS 49  BILITOT 0.3  PROT 7.4  ALBUMIN 3.7   No results for input(s): "LIPASE", "AMYLASE" in the last 168 hours. No results for input(s): "AMMONIA" in the last 168 hours. Coagulation Profile: Recent Labs  Lab 08/26/21 1521  INR 0.9   Cardiac Enzymes: No results for input(s): "CKTOTAL", "CKMB", "CKMBINDEX", "TROPONINI" in the last 168 hours. BNP (last 3 results) No results for input(s): "PROBNP" in the last 8760 hours. HbA1C: No results for input(s): "HGBA1C" in the last 72 hours. CBG: No results for input(s): "GLUCAP" in the last 168 hours. Lipid Profile: No results for input(s): "CHOL", "HDL", "LDLCALC", "TRIG", "CHOLHDL", "LDLDIRECT" in the last 72 hours. Thyroid Function Tests: No results for input(s): "TSH", "T4TOTAL", "FREET4", "T3FREE", "THYROIDAB" in the last 72 hours. Anemia Panel: No results for input(s): "VITAMINB12", "FOLATE", "FERRITIN", "TIBC", "IRON", "RETICCTPCT" in the last 72 hours. Urine analysis:    Component Value Date/Time    COLORURINE YELLOW 08/27/2021 0038   APPEARANCEUR CLEAR 08/27/2021 0038   LABSPEC 1.011 08/27/2021 0038   PHURINE 6.0 08/27/2021 0038   GLUCOSEU  NEGATIVE 08/27/2021 0038   HGBUR NEGATIVE 08/27/2021 0038   BILIRUBINUR NEGATIVE 08/27/2021 0038   KETONESUR NEGATIVE 08/27/2021 0038   PROTEINUR NEGATIVE 08/27/2021 0038   UROBILINOGEN 0.2 01/29/2019 1634   NITRITE NEGATIVE 08/27/2021 0038   LEUKOCYTESUR NEGATIVE 08/27/2021 0038    Radiological Exams on Admission: I have personally reviewed images DG Chest 2 View  Result Date: 08/26/2021 CLINICAL DATA:  Shortness of breath, productive cough, fever EXAM: CHEST - 2 VIEW COMPARISON:  07/09/2021 FINDINGS: Cardiac size is within normal limits. COPD with bullous emphysema is seen. There is linear patchy infiltrate in the medial right lower lung fields suggesting scarring and possibly superimposed atelectasis/pneumonia. There are other thin linear densities in both lungs suggesting scarring. There is no pleural effusion or pneumothorax. IMPRESSION: Severe COPD with bullous emphysema. There is linear patchy infiltrate in the medial right lower lung fields suggesting scarring and possibly superimposed atelectasis/pneumonia. Electronically Signed   By: Elmer Picker M.D.   On: 08/26/2021 15:50    EKG: Independently reviewed.  Sinus tachycardia, no acute ischemic changes.  Assessment and Plan  Acute exacerbation of severe COPD Now improved after bronchodilator treatments and IV Decadron. Sable on 2 L O2 and no longer wheezing. -Prednisone 60 mg daily -DuoNeb every 8 hours -Pulmicort neb twice daily -Albuterol neb prn -Mucinex -Continue antibiotics  Possible CAP ?Sepsis Chest x-ray showing a linear patchy infiltrate in the medial right lower lung field concerning for scarring and possibly superimposed atelectasis/pneumonia.  Afebrile and no leukocytosis.  Lactic acid mildly elevated but stable. -Ceftriaxone and azithromycin -Check  procalcitonin -Blood cultures pending -SARS-CoV-2 PCR  Acute on chronic respiratory failure with hypoxia Currently stable on 2 L O2, same as home requirement. -Continue supplemental oxygen  Abdominal distention Constipation Abdomen is soft and bowel sounds present.  No nausea or vomiting. -KUB ordered  Hypertension Stable. -Continue amlodipine and Coreg.  Hyperlipidemia -Continue Lipitor  Schizophrenia -Continue home medications.  BPH -Continue Flomax  DVT prophylaxis: Lovenox Code Status: DNR/DNI (discussed with the patient and his sister) Family Communication: Sister at bedside. Level of care: Telemetry bed Admission status: It is my clinical opinion that referral for OBSERVATION is reasonable and necessary in this patient based on the above information provided. The aforementioned taken together are felt to place the patient at high risk for further clinical deterioration. However, it is anticipated that the patient may be medically stable for discharge from the hospital within 24 to 48 hours.   Shela Leff MD [facility]  If 7PM-7AM, please contact night-coverage [email or web address]  08/27/2021, 2:08 AM

## 2021-08-27 NOTE — Progress Notes (Signed)
PROGRESS NOTE    Blake Mcknight  HUT:[id number] DOB: 08-22-1955 DOA: 08/26/2021 PCP: [name], MD   Brief Narrative:  HPI: Blake Mcknight is a 66 y.o. male with medical history significant of COPD, chronic hypoxemic respiratory failure on 2 L O2, former tobacco use, hypertension, hyperlipidemia, schizophrenia, GERD, BPH presented to the ED via EMS complaining of shortness of breath and cough.  EMS gave DuoNeb x2 with some improvement.  Afebrile.  Satting well on 2 L O2.  Labs showing WBC 10.4, hemoglobin 14.8, platelet count 289k.  Sodium 139, potassium 4.1, chloride 103, bicarb 24, BUN 7, creatinine 0.7, glucose 102.  Lactic acid 2.4 > 2.4.  Blood cultures drawn.  UA without signs of infection.  Chest x-ray showing severe COPD with bullous emphysema.  There is a linear patchy infiltrate in the medial right lower lung fields suggesting scarring and possibly superimposed atelectasis/pneumonia.  EKG without acute ischemic changes.  Patient was given IV Decadron 10 mg, DuoNeb x2, and 1 L normal saline bolus in the ED.   History provided by patient and his sister at bedside.  Reporting 4-day history of progressively worsening dyspnea and cough productive of yellow to green sputum.  No fevers.  Patient denies chest pain.  He is using Beztri 2 puffs in the morning and 2 puffs at night.  Also using DuoNeb and sister is concerned that for the past few days he has required it every 2 to 4 hours.  Per sister, he is supposed to be on 2 L O2 all the time but uses it only at night and keeps taking it off during the day.  Sister is concerned that the patient's abdomen looks distended and might be making his breathing worse.  Reports patient having chronic constipation for which he is receiving over-the-counter laxatives.  No nausea or vomiting; eating meals.  Assessment & Plan:   Principal Problem:   COPD exacerbation ([place]) Active Problems:   HTN (hypertension)   HLD (hyperlipidemia)    Schizophrenia ([place])   Acute on chronic respiratory failure with hypoxia ([facility])   CAP (community acquired pneumonia)  Pneumonia and sepsis ruled out: Although chest x-ray shows scarring which could be pneumonia but patient's procalcitonin is unremarkable and he has no other signs of pneumonia such as fever or leukocytosis.  This is likely scarring due to COPD.  We will discontinue Rocephin.   Chronic respiratory failure with hypoxia secondary to acute COPD exacerbation: Patient feels better but still appears to be tachycardic and tachypneic.  He is supposed to use oxygen 24/7 but according to the history, he uses only at night.  He denies any worsening shortness of breath.  Uses 2 L of oxygen and that is what he is on right now.  We will continue Solu-Medrol, bronchodilators and azithromycin.   Abdominal distention: On my examination his abdomen is very mildly distended but very soft and nontender.  Abdominal x-ray is negative for SBO or ileus.  Essential hypertension: Controlled.  Continue Coreg and amlodipine.  Hyperlipidemia -Continue Lipitor   Schizophrenia -Continue home medications.   BPH -Continue Flomax  DVT prophylaxis: enoxaparin (LOVENOX) injection 40 mg Start: 08/27/21 1400   Code Status: DNR  Family Communication:  None present at bedside.  Plan of care discussed with patient in length and he/she verbalized understanding and agreed with it.  Status is: Observation The patient will require care spanning > 2 midnights and should be moved to inpatient because: Patient requires in the day of hospitalization since  he still appears sick.  Not stable for discharge.   Estimated body mass index is 24.59 kg/m as calculated from the following:   Height as of 07/17/21: '5\' 7"'$  (1.702 m).   Weight as of 08/08/21: 71.2 kg.    Nutritional Assessment: There is no height or weight on file to calculate BMI.. Seen by dietician.  I agree with the assessment and plan as outlined  below: Nutrition Status:        . Skin Assessment: I have examined the patient's skin and I agree with the wound assessment as performed by the wound care RN as outlined below:    Consultants:  None  Procedures:  None  Antimicrobials:  Anti-infectives (From admission, onward)    Start     Dose/Rate Route Frequency Ordered Stop   08/27/21 0600  azithromycin (ZITHROMAX) 500 mg in sodium chloride 0.9 % 250 mL IVPB        500 mg 250 mL/hr over 60 Minutes Intravenous Every 24 hours 08/27/21 0309     08/27/21 0400  cefTRIAXone (ROCEPHIN) 1 g in sodium chloride 0.9 % 100 mL IVPB  Status:  Discontinued        1 g 200 mL/hr over 30 Minutes Intravenous Every 24 hours 08/27/21 0309 08/27/21 0831         Subjective: Patient seen and examined in the ED.  Very pleasant gentleman.  Denies any shortness of breath but visibly appears slightly diaphoretic and tachypneic.  Objective: Vitals:   08/27/21 0130 08/27/21 0215 08/27/21 0315 08/27/21 0702  BP: (!) 144/94 (!) 136/103 121/86 124/82  Pulse: (!) 104 (!) 109 (!) 112 (!) 102  Resp: (!) 22 (!) 30 (!) 25 18  Temp:    (!) 97.4 F (36.3 C)  TempSrc:    Oral  SpO2: 97% 91% 91% 95%    Intake/Output Summary (Last 24 hours) at 08/27/2021 1022 Last data filed at 08/27/2021 0440 Gross per 24 hour  Intake 1000 ml  Output 500 ml  Net 500 ml   There were no vitals filed for this visit.  Examination:  General exam: Appears tachypneic and diaphoretic. Respiratory system: No wheezes but scattered rhonchi bilaterally. Respiratory effort normal. Cardiovascular system: S1 & S2 heard, RRR. No JVD, murmurs, rubs, gallops or clicks. No pedal edema. Gastrointestinal system: Abdomen is mildly distended, soft and nontender. No organomegaly or masses felt. Normal bowel sounds heard. Central nervous system: Alert and oriented. No focal neurological deficits. Extremities: Symmetric 5 x 5 power. Skin: No rashes, lesions or ulcers Psychiatry:  Judgement and insight appear poor   Data Reviewed: I have personally reviewed following labs and imaging studies  CBC: Recent Labs  Lab 08/26/21 1521  WBC 10.4  NEUTROABS 6.1  HGB 14.8  HCT 43.6  MCV 90.5  PLT 502   Basic Metabolic Panel: Recent Labs  Lab 08/26/21 1521  NA 139  K 4.1  CL 103  CO2 24  GLUCOSE 102*  BUN 7*  CREATININE 0.73  CALCIUM 9.5   GFR: CrCl cannot be calculated (Unknown ideal weight.). Liver Function Tests: Recent Labs  Lab 08/26/21 1521  AST 17  ALT 29  ALKPHOS 49  BILITOT 0.3  PROT 7.4  ALBUMIN 3.7   No results for input(s): "LIPASE", "AMYLASE" in the last 168 hours. No results for input(s): "AMMONIA" in the last 168 hours. Coagulation Profile: Recent Labs  Lab 08/26/21 1521  INR 0.9   Cardiac Enzymes: No results for input(s): "CKTOTAL", "CKMB", "CKMBINDEX", "TROPONINI" in the  last 168 hours. BNP (last 3 results) No results for input(s): "PROBNP" in the last 8760 hours. HbA1C: No results for input(s): "HGBA1C" in the last 72 hours. CBG: No results for input(s): "GLUCAP" in the last 168 hours. Lipid Profile: No results for input(s): "CHOL", "HDL", "LDLCALC", "TRIG", "CHOLHDL", "LDLDIRECT" in the last 72 hours. Thyroid Function Tests: No results for input(s): "TSH", "T4TOTAL", "FREET4", "T3FREE", "THYROIDAB" in the last 72 hours. Anemia Panel: No results for input(s): "VITAMINB12", "FOLATE", "FERRITIN", "TIBC", "IRON", "RETICCTPCT" in the last 72 hours. Sepsis Labs: Recent Labs  Lab 08/26/21 1556 08/26/21 1819 08/27/21 0430  PROCALCITON  --   --  <0.10  LATICACIDVEN 2.4* 2.4*  --     No results found for this or any previous visit (from the past 240 hour(s)).   Radiology Studies: DG Abd 1 View  Result Date: 08/27/2021 CLINICAL DATA:  [id number].  Abdominal pain. EXAM: ABDOMEN - 1 VIEW COMPARISON:  CT abdomen pelvis and reconstructions 05/22/2021. FINDINGS: The bowel gas pattern is normal but there is moderate gas  distention of the stomach much greater than previously. No radio-opaque calculi or acute radiographic abnormality are seen. There is heavy aortoiliac calcific plaque. There are cholecystectomy clips seen previously. Osteopenia with degenerative changes of the spine both hips. IMPRESSION: Moderately gas distended stomach. No other significant findings. No bowel dilatation or thumbprinting is seen. Osteopenia and degenerative change. Aortic atherosclerosis. Electronically Signed   By: Telford Nab M.D.   On: 08/27/2021 03:34   DG Chest 2 View  Result Date: 08/26/2021 CLINICAL DATA:  Shortness of breath, productive cough, fever EXAM: CHEST - 2 VIEW COMPARISON:  07/09/2021 FINDINGS: Cardiac size is within normal limits. COPD with bullous emphysema is seen. There is linear patchy infiltrate in the medial right lower lung fields suggesting scarring and possibly superimposed atelectasis/pneumonia. There are other thin linear densities in both lungs suggesting scarring. There is no pleural effusion or pneumothorax. IMPRESSION: Severe COPD with bullous emphysema. There is linear patchy infiltrate in the medial right lower lung fields suggesting scarring and possibly superimposed atelectasis/pneumonia. Electronically Signed   By: Elmer Picker M.D.   On: 08/26/2021 15:50    Scheduled Meds:  amLODipine  10 mg Oral Daily   atorvastatin  10 mg Oral QHS   benztropine  2 mg Oral BID   budesonide (PULMICORT) nebulizer solution  0.25 mg Nebulization BID   carvedilol  1.5625 mg Oral Q supper   carvedilol  3.125 mg Oral Q breakfast   enoxaparin (LOVENOX) injection  40 mg Subcutaneous Q24H   guaiFENesin  600 mg Oral BID   ipratropium-albuterol  3 mL Nebulization Q8H   mirtazapine  30 mg Oral QHS   predniSONE  60 mg Oral Q breakfast   risperiDONE  3 mg Oral BID   tamsulosin  0.8 mg Oral QHS   Continuous Infusions:  azithromycin Stopped (08/27/21 0753)     LOS: 0 days   Darliss Cheney, MD [facility]  08/27/2021, 10:22 AM   *Please note that this is a verbal dictation therefore any spelling or grammatical errors are due to the "[place] [employer]" system interpretation.  Please page via [place] and do not message via secure chat for urgent patient care matters. Secure chat can be used for non urgent patient care matters.  How to contact the [facility] Attending or Consulting provider [place] or covering provider during after hours [place], for this patient?  Check the care team in [facility] and look for a)  attending/consulting [facility] provider listed and b) the [facility] team listed. Page or secure chat [phone]. Log into [email or web address] and use [place]'s universal password to access. If you do not have the password, please contact the hospital operator. Locate the [facility] provider you are looking for under [facility] and page to a number that you can be directly reached. If you still have difficulty reaching the provider, please page the [facility] (Director on Call) for the Hospitalists listed on amion for assistance.

## 2021-08-28 ENCOUNTER — Other Ambulatory Visit (HOSPITAL_COMMUNITY): Payer: Self-pay

## 2021-08-28 DIAGNOSIS — J441 Chronic obstructive pulmonary disease with (acute) exacerbation: Secondary | ICD-10-CM | POA: Diagnosis not present

## 2021-08-28 MED ORDER — PREDNISONE 20 MG PO TABS: 60.0000 mg | ORAL_TABLET | Freq: Every day | ORAL | 0 refills | Status: DC

## 2021-08-28 MED ORDER — AZITHROMYCIN 500 MG PO TABS
500.0000 mg | ORAL_TABLET | Freq: Every day | ORAL | 0 refills | Status: AC
  Filled 2021-08-28: qty 1, 1d supply, fill #0

## 2021-08-28 MED ORDER — AZITHROMYCIN 500 MG PO TABS: 500.0000 mg | ORAL_TABLET | Freq: Every day | ORAL | 0 refills | Status: DC

## 2021-08-28 MED ORDER — PREDNISONE 10 MG PO TABS
ORAL_TABLET | ORAL | 0 refills | Status: AC
  Filled 2021-08-28: qty 20, 8d supply, fill #0

## 2021-08-28 NOTE — Discharge Summary (Signed)
Physician Discharge Summary  Blake Mcknight HCW:[id number] DOB: Sep 25, 1955 DOA: 08/26/2021  PCP: [name], MD  Admit date: 08/26/2021 Discharge date: 08/28/2021  Admitted From: Home Disposition:  Home  Discharge Condition:Stable CODE STATUS:DNR Diet recommendation: Heart Healthy  Brief/Interim Summary: Blake Mcknight is a 66 y.o. male with medical history significant of COPD, chronic hypoxemic respiratory failure on 2 L O2, former tobacco use, hypertension, hyperlipidemia, schizophrenia, GERD, BPH presented to the ED via EMS complaining of shortness of breath and cough.  EMS gave DuoNeb x2 with some improvement.  Afebrile.  Satting well on 2 L O2.  Labs showing WBC 10.4, hemoglobin 14.8, platelet count 289k.  Sodium 139, potassium 4.1, chloride 103, bicarb 24, BUN 7, creatinine 0.7, glucose 102.  Lactic acid 2.4 > 2.4.  Blood cultures drawn.  UA without signs of infection.  Chest x-ray showing severe COPD with bullous emphysema.  There is a linear patchy infiltrate in the medial right lower lung fields suggesting scarring and possibly superimposed atelectasis/pneumonia.  Patient was admitted for the management of acute COPD exacerbation.  Started on steroids.  This morning he is doing great.  On auscultation, no wheezings were heard, currently he is on baseline oxygen requirement.  He is on 2 L of oxygen at home.  Medically stable for discharge with oral steroids.  Following problems were addressed during his hospitalization:  Acute exacerbation of severe COPD Now improved after bronchodilator treatments and IV Decadron. Sable on 2 L O2 and no longer wheezing.  Continue prednisone taper.  He has inhalers at home, also on DuoNeb at home.  We recommend to follow-up with pulmonology as an outpatient.    Suspected community-acquired pneumonia: Chest x-ray showing a linear patchy infiltrate in the medial right lower lung field concerning for scarring and possibly superimposed  atelectasis/pneumonia.  Afebrile and no leukocytosis.  Lactic acid mildly elevated but stable. -Initially started on ceftriaxone and azithromycin -Procalcitonin negative.  Currently only on azithromycin, can complete 3 days course by taking 1 more dose tomorrow.  Acute on chronic respiratory failure with hypoxia Currently stable on 2 L O2, same as home requirement. -Continue supplemental oxygen   Abdominal distention Constipation Abdomen is soft and bowel sounds present.  No nausea or vomiting. -KUB ordered without signs of bowel obstruction or ileus. -Continue MiraLAX at home.  Last bowel movement was yesterday   Hypertension Stable. -Continue amlodipine and Coreg.   Hyperlipidemia -Continue Lipitor   Schizophrenia -Continue home medications.   BPH -Continue Flomax     Discharge Diagnoses:  Principal Problem:   COPD exacerbation ([place]) Active Problems:   HTN (hypertension)   HLD (hyperlipidemia)   Schizophrenia (HCC)   Acute on chronic respiratory failure with hypoxia (HCC)   CAP (community acquired pneumonia)   Acute exacerbation of chronic obstructive pulmonary disease (COPD) ([place])    Discharge Instructions  Discharge Instructions     Diet - low sodium heart healthy   Complete by: As directed    Discharge instructions   Complete by: As directed    1)Please take prescribed medications as instructed 2)Follow up with your PCP in a week 3)Follow up with pulmonology as an outpatient through the referral of your  PCP.   Increase activity slowly   Complete by: As directed       Allergies as of 08/28/2021   No Known Allergies      Medication List     STOP taking these medications    amoxicillin-clavulanate 875-125 MG tablet Commonly known as:  AUGMENTIN   montelukast 10 MG tablet Commonly known as: SINGULAIR       TAKE these medications    albuterol 108 (90 Base) MCG/ACT inhaler Commonly known as: VENTOLIN HFA Inhale 2 puffs into the lungs  every 4 (four) hours as needed for wheezing or shortness of breath.   amLODipine 10 MG tablet Commonly known as: NORVASC Take 10 mg by mouth daily.   atorvastatin 10 MG tablet Commonly known as: LIPITOR Take 10 mg by mouth at bedtime.   azithromycin 500 MG tablet Commonly known as: Zithromax Take 1 tablet (500 mg total) by mouth daily for 1 day. Take 1 tablet daily for 3 days. Start taking on: August 29, 2021   benzonatate 200 MG capsule Commonly known as: TESSALON Take 1 capsule (200 mg total) by mouth 2 (two) times daily as needed for cough.   benztropine 2 MG tablet Commonly known as: COGENTIN Take 1 tablet (2 mg total) by mouth 2 (two) times daily.   Breztri Aerosphere 160-9-4.8 MCG/ACT Aero Generic drug: Budeson-Glycopyrrol-Formoterol Inhale 2 puffs into the lungs in the morning and at bedtime.   carvedilol 3.125 MG tablet Commonly known as: COREG Take 1 tablet (3.125 mg total) by mouth 2 (two) times daily with a meal. What changed:  how much to take when to take this additional instructions   castor oil liquid Take 10 mLs by mouth daily as needed for moderate constipation or mild constipation.   cetirizine 10 MG tablet Commonly known as: ZYRTEC Take 10 mg by mouth daily as needed for allergies or rhinitis.   cholecalciferol 25 MCG (1000 UNIT) tablet Commonly known as: VITAMIN D Take 4,000 Units by mouth every evening.   famotidine 40 MG tablet Commonly known as: PEPCID Take 40 mg by mouth 2 (two) times daily.   HYDROCODONE-CHLORPHENIRAMINE PO Take 5 mLs by mouth every 6 (six) hours as needed (cough and pain).   ipratropium-albuterol 0.5-2.5 (3) MG/3ML Soln Commonly known as: DUONEB Take 3 mLs by nebulization every 4 (four) hours.   MILK THISTLE PO Take 1 capsule by mouth daily.   mirtazapine 30 MG tablet Commonly known as: REMERON Take 1 tablet (30 mg total) by mouth at bedtime.   multivitamin Tabs tablet Take 1 tablet by mouth daily.   OVER THE  COUNTER MEDICATION Take 2-4 tablets by mouth daily as needed (constipation). Oxy powder   OXYGEN Inhale 2 L/min into the lungs continuous.   polyethylene glycol 17 g packet Commonly known as: MIRALAX / GLYCOLAX Take 17 g by mouth daily as needed for mild constipation.   predniSONE 20 MG tablet Commonly known as: DELTASONE Take 3 tablets (60 mg total) by mouth daily with breakfast. 4 pills daily for 2 days then 3 pills daily for 2 days then 2 pills daily for 2 days then 1 pill daily for 2 days then stop Start taking on: August 29, 2021 What changed:  medication strength how much to take when to take this additional instructions Another medication with the same name was removed. Continue taking this medication, and follow the directions you see here.   risperiDONE 3 MG tablet Commonly known as: RISPERDAL Take 1 tablet (3 mg total) by mouth 2 (two) times daily.   sodium chloride 0.65 % Soln nasal spray Commonly known as: OCEAN Place 1 spray into both nostrils as needed for congestion.   tamsulosin 0.4 MG Caps capsule Commonly known as: FLOMAX Take 0.8 mg by mouth at bedtime.  Follow-up Information     [name], MD. Schedule an appointment as soon as possible for a visit in 1 week(s).   Specialty: Pulmonary Disease Contact information: [place] [address] [phone]                No Known Allergies  Consultations: None   Procedures/Studies: DG Abd 1 View  Result Date: 08/27/2021 CLINICAL DATA:  [id number].  Abdominal pain. EXAM: ABDOMEN - 1 VIEW COMPARISON:  CT abdomen pelvis and reconstructions 05/22/2021. FINDINGS: The bowel gas pattern is normal but there is moderate gas distention of the stomach much greater than previously. No radio-opaque calculi or acute radiographic abnormality are seen. There is heavy aortoiliac calcific plaque. There are cholecystectomy clips seen previously. Osteopenia with degenerative  changes of the spine both hips. IMPRESSION: Moderately gas distended stomach. No other significant findings. No bowel dilatation or thumbprinting is seen. Osteopenia and degenerative change. Aortic atherosclerosis. Electronically Signed   By: Telford Nab M.D.   On: 08/27/2021 03:34   DG Chest 2 View  Result Date: 08/26/2021 CLINICAL DATA:  Shortness of breath, productive cough, fever EXAM: CHEST - 2 VIEW COMPARISON:  07/09/2021 FINDINGS: Cardiac size is within normal limits. COPD with bullous emphysema is seen. There is linear patchy infiltrate in the medial right lower lung fields suggesting scarring and possibly superimposed atelectasis/pneumonia. There are other thin linear densities in both lungs suggesting scarring. There is no pleural effusion or pneumothorax. IMPRESSION: Severe COPD with bullous emphysema. There is linear patchy infiltrate in the medial right lower lung fields suggesting scarring and possibly superimposed atelectasis/pneumonia. Electronically Signed   By: Elmer Picker M.D.   On: 08/26/2021 15:50      Subjective: Patient seen and examined at the bedside this morning.  Hemodynamically stable for discharge today.  Discharge Exam: Vitals:   08/28/21 0843 08/28/21 0942  BP:  140/89  Pulse:  (!) 101  Resp:  18  Temp:  98 F (36.7 C)  SpO2: 96% 95%   Vitals:   08/28/21 0131 08/28/21 0508 08/28/21 0843 08/28/21 0942  BP:  112/76  140/89  Pulse:  87  (!) 101  Resp:  18  18  Temp:  98 F (36.7 C)  98 F (36.7 C)  TempSrc:  Oral    SpO2: 96% 100% 96% 95%  Weight:      Height:        General: Pt is alert, awake, not in acute distress Cardiovascular: RRR, S1/S2 +, no rubs, no gallops Respiratory: CTA bilaterally, no wheezing, no rhonchi Abdominal: Soft, NT, ND, bowel sounds + Extremities: no edema, no cyanosis    The results of significant diagnostics from this hospitalization (including imaging, microbiology, ancillary and laboratory) are listed below  for reference.     Microbiology: Recent Results (from the past 240 hour(s))  Blood Culture (routine x 2)     Status: None (Preliminary result)   Collection Time: 08/26/21  3:26 PM   Specimen: BLOOD  Result Value Ref Range Status   Specimen Description BLOOD LEFT ANTECUBITAL  Final   Special Requests   Final    BOTTLES DRAWN AEROBIC AND ANAEROBIC Blood Culture adequate volume   Culture   Final    NO GROWTH 2 DAYS Performed at [facility], [address] [place], [place] [address]    Report Status PENDING  Incomplete  Blood Culture (routine x 2)     Status: None (Preliminary result)   Collection Time: 08/26/21  6:16 PM   Specimen: BLOOD  Result Value Ref Range Status   Specimen Description BLOOD LEFT ANTECUBITAL  Final   Special Requests   Final    BOTTLES DRAWN AEROBIC AND ANAEROBIC Blood Culture adequate volume   Culture   Final    NO GROWTH 2 DAYS Performed at [facility], [address] [place], [place] [address]    Report Status PENDING  Incomplete  Urine Culture     Status: None   Collection Time: 08/27/21 12:38 AM   Specimen: In/Out Cath Urine  Result Value Ref Range Status   Specimen Description IN/OUT CATH URINE  Final   Special Requests NONE  Final   Culture   Final    NO GROWTH Performed at [facility], [place] [address], [place], [place] [address]    Report Status 08/27/2021 FINAL  Final  SARS Coronavirus 2 by RT PCR (hospital order, performed in [facility] hospital lab) *cepheid single result test* Anterior Nasal Swab     Status: None   Collection Time: 08/27/21  3:12 AM   Specimen: Anterior Nasal Swab  Result Value Ref Range Status   SARS Coronavirus 2 by RT PCR NEGATIVE NEGATIVE Final    Comment: Performed at [facility], [place] [address] [place], [place] [address]     Labs: BNP (last 3 results) Recent Labs    07/09/21 1000  BNP 53.9   Basic Metabolic Panel: Recent Labs  Lab 08/26/21 1521  NA 139  K 4.1  CL 103  CO2  24  GLUCOSE 102*  BUN 7*  CREATININE 0.73  CALCIUM 9.5   Liver Function Tests: Recent Labs  Lab 08/26/21 1521  AST 17  ALT 29  ALKPHOS 49  BILITOT 0.3  PROT 7.4  ALBUMIN 3.7   No results for input(s): "LIPASE", "AMYLASE" in the last 168 hours. No results for input(s): "AMMONIA" in the last 168 hours. CBC: Recent Labs  Lab 08/26/21 1521  WBC 10.4  NEUTROABS 6.1  HGB 14.8  HCT 43.6  MCV 90.5  PLT 289   Cardiac Enzymes: No results for input(s): "CKTOTAL", "CKMB", "CKMBINDEX", "TROPONINI" in the last 168 hours. BNP: Invalid input(s): "POCBNP" CBG: No results for input(s): "GLUCAP" in the last 168 hours. D-Dimer No results for input(s): "DDIMER" in the last 72 hours. Hgb A1c No results for input(s): "HGBA1C" in the last 72 hours. Lipid Profile No results for input(s): "CHOL", "HDL", "LDLCALC", "TRIG", "CHOLHDL", "LDLDIRECT" in the last 72 hours. Thyroid function studies No results for input(s): "TSH", "T4TOTAL", "T3FREE", "THYROIDAB" in the last 72 hours.  Invalid input(s): "FREET3" Anemia work up No results for input(s): "VITAMINB12", "FOLATE", "FERRITIN", "TIBC", "IRON", "RETICCTPCT" in the last 72 hours. Urinalysis    Component Value Date/Time   COLORURINE YELLOW 08/27/2021 0038   APPEARANCEUR CLEAR 08/27/2021 0038   LABSPEC 1.011 08/27/2021 0038   PHURINE 6.0 08/27/2021 0038   GLUCOSEU NEGATIVE 08/27/2021 0038   HGBUR NEGATIVE 08/27/2021 0038   BILIRUBINUR NEGATIVE 08/27/2021 0038   KETONESUR NEGATIVE 08/27/2021 0038   PROTEINUR NEGATIVE 08/27/2021 0038   UROBILINOGEN 0.2 01/29/2019 1634   NITRITE NEGATIVE 08/27/2021 0038   LEUKOCYTESUR NEGATIVE 08/27/2021 0038   Sepsis Labs Recent Labs  Lab 08/26/21 1521  WBC 10.4   Microbiology Recent Results (from the past 240 hour(s))  Blood Culture (routine x 2)     Status: None (Preliminary result)   Collection Time: 08/26/21  3:26 PM   Specimen: BLOOD  Result Value  Ref Range Status   Specimen  Description BLOOD LEFT ANTECUBITAL  Final   Special Requests   Final    BOTTLES DRAWN AEROBIC AND ANAEROBIC Blood Culture adequate volume   Culture   Final    NO GROWTH 2 DAYS Performed at [facility], [address], [place], [place] [address]    Report Status PENDING  Incomplete  Blood Culture (routine x 2)     Status: None (Preliminary result)   Collection Time: 08/26/21  6:16 PM   Specimen: BLOOD  Result Value Ref Range Status   Specimen Description BLOOD LEFT ANTECUBITAL  Final   Special Requests   Final    BOTTLES DRAWN AEROBIC AND ANAEROBIC Blood Culture adequate volume   Culture   Final    NO GROWTH 2 DAYS Performed at [facility], [place] [address], [place], [place] [address]    Report Status PENDING  Incomplete  Urine Culture     Status: None   Collection Time: 08/27/21 12:38 AM   Specimen: In/Out Cath Urine  Result Value Ref Range Status   Specimen Description IN/OUT CATH URINE  Final   Special Requests NONE  Final   Culture   Final    NO GROWTH Performed at [facility], [place] [address], [place], [place] [address]    Report Status 08/27/2021 FINAL  Final  SARS Coronavirus 2 by RT PCR (hospital order, performed in [facility] hospital lab) *cepheid single result test* Anterior Nasal Swab     Status: None   Collection Time: 08/27/21  3:12 AM   Specimen: Anterior Nasal Swab  Result Value Ref Range Status   SARS Coronavirus 2 by RT PCR NEGATIVE NEGATIVE Final    Comment: Performed at [facility], [place] [address] [place], [place] [address]    Please note: You were cared for by a hospitalist during your hospital stay. Once you are discharged, your primary care physician will handle any further medical issues. Please note that NO REFILLS for any discharge medications will be authorized once you are discharged, as it is imperative that you return to your primary care physician (or establish a relationship with a primary care physician if you do not  have one) for your post hospital discharge needs so that they can reassess your need for medications and monitor your lab values.    Time coordinating discharge: 40 minutes  SIGNED:   Shelly Coss, MD  [facility] 08/28/2021, 10:47 AM Pager [phone]  If 7PM-7AM, please contact night-coverage [email or web address] Password [email or web address]

## 2021-08-28 NOTE — Progress Notes (Signed)
Patient sister just arrived to take this patient home, tried to call her since 64 am 4 time but her phone was out of service. No changes in patient condition.

## 2021-08-28 NOTE — TOC Transition Note (Signed)
Transition of Care [facility]) - CM/SW Discharge Note   Patient Details  Name: Blake Mcknight MRN: [id number] Date of Birth: 1955-07-19  Transition of Care [facility]) CM/SW Contact:  Tom-Johnson, Renea Ee, RN Phone Number: 08/28/2021, 12:53 PM   Clinical Narrative:     Patient is scheduled for discharge today. Admitted for COPD Exacerbation.  From home with sister. Patient is not employed, currently on disability. Has a Rollator, shower seat and home O2 from [employer].  PCP is [name], MD and uses [facility] on [address]. No PT/OT needs or recommendations noted. Sister to transport patient home by 4pm-5pm. No further TOC needs noted.   Final next level of care: Home/Self Care Barriers to Discharge: Barriers Resolved   Patient Goals and CMS Choice Patient states their goals for this hospitalization and ongoing recovery are:: To return home CMS Medicare.gov Compare Post Acute Care list provided to:: Patient Choice offered to / list presented to : NA  Discharge Placement                Patient to be transferred to facility by: Sister      Discharge Plan and Services                DME Arranged: N/A DME Agency: NA       HH Arranged: NA [place] Agency: NA        Social Determinants of Health (SDOH) Interventions     Readmission Risk Interventions    06/22/2019   12:33 PM 06/07/2019   10:46 AM 06/07/2019    9:49 AM  Readmission Risk Prevention Plan  Transportation Screening Complete  Complete  PCP or Specialist Appt within 3-5 Days Complete  Complete  HRI or [place] Complete Complete   Social Work Consult for [place] Planning/Counseling Complete    Palliative Care Screening Complete  Not Applicable  Medication Review Press photographer) Complete  Referral to Pharmacy

## 2021-08-29 ENCOUNTER — Other Ambulatory Visit: Payer: Self-pay | Admitting: *Deleted

## 2021-08-29 NOTE — Patient Outreach (Signed)
[facility]) Care Management Telephonic RN Care Manager Note   08/29/2021 Name:  Blake Mcknight MRN:  [id number] DOB:  1955/09/19  Summary: [name] call placed to member's sister, successful.  Denies any urgent concerns, encouraged to contact this care manager with questions.     Subjective: Blake Mcknight is an 66 y.o. year old male who is a primary patient of [name], MD. The care management team was consulted for assistance with care management and/or care coordination needs.    Telephonic RN Care Manager completed Telephone Visit today.  Objective:   Medications Reviewed Today     Reviewed by Lannette Donath, CPhT (Pharmacy Technician) on 08/26/21 at 2326  Med List Status: Complete   Medication Order Taking? Sig Documenting Provider Last Dose Status Informant  albuterol (VENTOLIN HFA) 108 (90 Base) MCG/ACT inhaler [id number] Yes Inhale 2 puffs into the lungs every 4 (four) hours as needed for wheezing or shortness of breath. Aline August, MD unk Active Family Member  amLODipine (NORVASC) 10 MG tablet [id number] Yes Take 10 mg by mouth daily. [provider] 08/26/2021 Active Family Member  amoxicillin-clavulanate (AUGMENTIN) 875-125 MG tablet [id number] No Take 1 tablet by mouth 2 (two) times daily.  Patient not taking: Reported on 08/26/2021   Collene Gobble, MD Completed Course Active Family Member  atorvastatin (LIPITOR) 10 MG tablet [id number] Yes Take 10 mg by mouth at bedtime. [provider] 08/25/2021 Active Family Member           Med Note [facility], GREG A   Sat Sep 22, 2015 12:12 AM)    benzonatate (TESSALON) 200 MG capsule [id number] Yes Take 1 capsule (200 mg total) by mouth 2 (two) times daily as needed for cough. Mariel Aloe, MD unk Active Family Member  benztropine (COGENTIN) 2 MG tablet [id number] Yes Take 1 tablet (2 mg total) by mouth 2 (two) times daily. Kathlee Nations, MD 08/26/2021 Active Family Member   BREZTRI AEROSPHERE 160-9-4.8 MCG/ACT AERO [id number] Yes Inhale 2 puffs into the lungs in the morning and at bedtime. [provider] 08/26/2021 Active Family Member  carvedilol (COREG) 3.125 MG tablet [id number] Yes Take 1 tablet (3.125 mg total) by mouth 2 (two) times daily with a meal.  Patient taking differently: Take 1.5625-3.125 mg by mouth See admin instructions. Take one tablet by mouth daily in the morning and one-half tablet by mouth daily in the evening.   Aline August, MD 08/26/2021 1000 Active Family Member  castor oil liquid [id number] Yes Take 10 mLs by mouth daily as needed for moderate constipation or mild constipation. [provider] 08/25/2021 Active Family Member  cetirizine (ZYRTEC) 10 MG tablet [id number] Yes Take 10 mg by mouth daily as needed for allergies or rhinitis. [provider] unk Active Family Member  cholecalciferol (VITAMIN D) 25 MCG (1000 UNIT) tablet [id number] Yes Take 4,000 Units by mouth every evening.  [provider] 08/25/2021 Active Family Member  famotidine (PEPCID) 40 MG tablet [id number] Yes Take 40 mg by mouth 2 (two) times daily. [provider] 08/25/2021 Active Family Member  HYDROCODONE-CHLORPHENIRAMINE PO [id number] Yes Take 5 mLs by mouth every 6 (six) hours as needed (cough and pain). [provider] unk Active Family Member  ipratropium-albuterol (DUONEB) 0.5-2.5 (3) MG/3ML SOLN [id number] Yes Take 3 mLs by nebulization every 4 (four) hours. [provider] 08/26/2021 Active Family Member  MILK THISTLE PO [id number] Yes Take 1 capsule by mouth daily. [provider] Past Week Active  Family Member  mirtazapine (REMERON) 30 MG tablet [id number] Yes Take 1 tablet (30 mg total) by mouth at bedtime. Kathlee Nations, MD 08/25/2021 Active Family Member  montelukast (SINGULAIR) 10 MG tablet [id number] No Take 1 tablet (10 mg total) by mouth daily.  Patient not taking: Reported on 09/04/2020   Harvie Heck, MD Not Taking Active Family Member  multivitamin (ONE-A-DAY MEN'S) TABS tablet [id number] Yes Take 1 tablet by mouth daily. [provider] 08/25/2021 Active Family Member  OVER THE COUNTER MEDICATION [id number] Yes Take 2-4 tablets by mouth daily as needed (constipation). Oxy powder [provider] 08/25/2021 Active Family Member  OXYGEN [id number] Yes Inhale 2 L/min into the lungs continuous.  [provider]  Active Family Member  polyethylene glycol (MIRALAX / GLYCOLAX) 17 g packet [id number] Yes Take 17 g by mouth daily as needed for mild constipation. [provider] Past Week Active Family Member  predniSONE (DELTASONE) 10 MG tablet [id number] Yes Take 2 tablets (20 mg total) by mouth 2 (two) times daily with a meal.  Patient taking differently: Take 10 mg by mouth 2 (two) times daily with a meal.   Veryl Speak, MD 08/25/2021 Active Family Member  predniSONE (DELTASONE) 50 MG tablet [id number] No Take 1 tablet (50 mg total) by mouth daily with breakfast.  Patient not taking: Reported on 08/08/2021   Isla Pence, MD Completed Course Active Family Member  risperiDONE (RISPERDAL) 3 MG tablet [id number] Yes Take 1 tablet (3 mg total) by mouth 2 (two) times daily. Kathlee Nations, MD 08/26/2021 Active Family Member  sodium chloride (OCEAN) 0.65 % SOLN nasal spray [id number] Yes Place 1 spray into both nostrils as needed for congestion. [provider] 08/25/2021 Active Family Member  tamsulosin (FLOMAX) 0.4 MG CAPS capsule [id number] Yes Take 0.8 mg by mouth at bedtime. [provider] 08/25/2021 Active Family Member             SDOH:  (Social Determinants of Health) assessments and interventions performed:     Care Plan  Review of patient past medical history, allergies, medications, health status, including review of consultants reports, laboratory and other test data, was performed as part of comprehensive evaluation for care  management services.   Care Plan : [facility] Plan of Care (Adult)  Updates made by [name], RN since 08/29/2021 12:00 AM     Problem: Difficulty with management of chronic medical conditions, HTN and COPD   Priority: High     Long-Range Goal: Memer and family will verbalize adequate plan of care to manage chronic health conditions, HTN and COPD (goal extended, continues to work on stability)   Start Date: 02/01/2021  Expected End Date: 02/01/2022  This Visit's Progress: On track  Recent Progress: On track  Priority: High  Note:   Current Barriers:  Chronic Disease Management support and education needs related to HTN and COPD  RNCM Clinical Goal(s):  Patient will verbalize understanding of plan for management of HTN and COPD as evidenced by sister verbalizing adequate plan of care and COPD zones take all medications exactly as prescribed and will call provider for medication related questions as evidenced by Sister reporting adherence continue to work with RN Care Manager to address care management and care coordination needs related to  HTN and COPD as evidenced by adherence to CM Team Scheduled appointments through collaboration with RN Care manager, provider, and care team.   Interventions: Inter-disciplinary care team collaboration (see longitudinal plan of care) Evaluation of current treatment  plan related to  self management and patient's adherence to plan as established by provider  Hypertension Interventions: Last practice recorded BP readings:  BP Readings from Last 3 Encounters:  08/28/21 135/80  08/08/21 120/78  07/17/21 118/68  Most recent eGFR/CrCl: No results found for: EGFR  No components found for: CRCL  Evaluation of current treatment plan related to hypertension self management and patient's adherence to plan as established by provider; Provided education to patient re: stroke prevention, s/s of heart attack and stroke; Discussed plans with patient for ongoing  care management follow up and provided patient with direct contact information for care management team; Advised patient, providing education and rationale, to monitor blood pressure daily and record, calling PCP for findings outside established parameters;   COPD Interventions:   Provided patient with basic written and verbal COPD education on self care/management/and exacerbation prevention; Advised patient to track and manage COPD triggers;  Provided instruction about proper use of medications used for management of COPD including inhalers; Advised patient to self assesses COPD action plan zone and make appointment with provider if in the yellow zone for 48 hours without improvement;   Update 11/18 - Sister report member has been doing well over the last several weeks.  He is using inhalers and nebulizers as indicated.  Was seen by PCP, no changes made in medications.  Discussed upcoming holidays and keeping member safe from sick contacts, she verbalizes understanding.    Patient Goals/Self-Care Activities: Patient will self administer medications as prescribed Patient will attend all scheduled provider appointments Patient will continue to perform ADL's independently  Follow Up Plan:  The patient has been provided with contact information for the care management team and has been advised to call with any health related questions or concerns.    Update 1/25 - Spoke with sister, state member is "ok."  He was seen in the ED on 12/22 due to lip swelling, diagnosed with allergic reaction.  She feel it may be more related to dental work the member needs.  She reports he has had low grade temps over the last week as well.  Dentist appointment scheduled for tomorrow.  Denies any issues with breathing, no chest discomfort, taking medications as prescribed.     Update 2/21 - Sister report member has done well over the last several months with managing COPD.  State he still has some "bad days" in  which he is using his inhalers and nebulizers for relief.  Denies member has had cough or shortness of breath lately.  Blood pressure has remained stable. He was seen by urologist on 2/9 due to intermittent bloody urine, was told that prostate was enlarged.  He will have a CT and cystoscopy in the next couple weeks as well as follow up visit with urology.  Tamsulosin was increased, sister report member is taking all medications as prescribed.   Update 3/21 - Per sister, member is doing well.  Denies any current shortness of breath, has not been seen in the ED or admitted since September 2022.  She has worked with member on improving techniques for effectiveness of nebulizers and inhalers, feels this has helped to keep member out of the hospital.  Office visit with PCP on 3/14, denies any concerns from visit.  Competed CT and cystoscopy with urology, follow up tomorrow for results.  Preliminary results show no cancer, however member does still have blood in urine intermittently.  Will have abdominal CT tomorrow as well as sister was told in  the past there was a "spot" on member's kidney.  GI consult scheduled for next month.   Update 4/18 - Sister state member has not had any signs/symptoms of exacerbation, denies shortness of breath.  Confirms final results of cystoscopy was negative for prostate cancer but there is concern for a lesion on his lung that could be cancer.  He has appointment with pulmonology on 5/3 for evaluation and further testing.  She will speak to PCP regarding the spot that was seen on his kidney.  He was seen by GI last week, in need for colonoscopy however there is concern regarding providing sedation with history of COPD.  He will have cologuard test instead.  Sister verbalizes understanding of accurate COPD management and action plan.    Update 5/16 - Member was seen in the ED on 4/25 for COPD, sister report member is doing much better now.  He was seen by pulmonary on 5/3, concern  for lung cancer.  Per chart, having a biopsy would be difficult due to his medical status.  PET scan completed on 5/10, has not scheduled follow up with pulmonary for results.  Offered to call office to schedule, sister state she will call directly.  Denies member has had any issues with breathing since ED visit, using nebulizers and inhalers, oxygen levels remain above 94%.     Update 6/15 - Spoke with sister, member admitted to hospital 6/12-6/14 for COPD.  No changes to inhalers or nebulizers, Prednisone and antibiotic added to regime.  Follow up with pulmonary done, PET scan resulted, no cancer identified, reportedly unable to perform biopsy due to member's chronic instability of lung condition.  She report member's breathing is much better than prior to admission, will have follow up chest CT on 6/26.  Will see PCP and pulmonology after scan is complete.          Plan:  Telephone follow up appointment with care management team member scheduled for:  1 month The patient has been provided with contact information for the care management team and has been advised to call with any health related questions or concerns.   [name], RN, MSN, [place] Manager [phone]

## 2021-08-31 LAB — CULTURE, BLOOD (ROUTINE X 2)
Culture: NO GROWTH
Culture: NO GROWTH
Special Requests: ADEQUATE
Special Requests: ADEQUATE

## 2021-09-02 DIAGNOSIS — Z79899 Other long term (current) drug therapy: Secondary | ICD-10-CM | POA: Diagnosis not present

## 2021-09-02 DIAGNOSIS — N401 Enlarged prostate with lower urinary tract symptoms: Secondary | ICD-10-CM | POA: Diagnosis not present

## 2021-09-02 DIAGNOSIS — F319 Bipolar disorder, unspecified: Secondary | ICD-10-CM | POA: Diagnosis not present

## 2021-09-02 DIAGNOSIS — J441 Chronic obstructive pulmonary disease with (acute) exacerbation: Secondary | ICD-10-CM | POA: Diagnosis not present

## 2021-09-02 DIAGNOSIS — K5900 Constipation, unspecified: Secondary | ICD-10-CM | POA: Diagnosis not present

## 2021-09-02 DIAGNOSIS — R7302 Impaired glucose tolerance (oral): Secondary | ICD-10-CM | POA: Diagnosis not present

## 2021-09-02 DIAGNOSIS — I119 Hypertensive heart disease without heart failure: Secondary | ICD-10-CM | POA: Diagnosis not present

## 2021-09-02 DIAGNOSIS — E7800 Pure hypercholesterolemia, unspecified: Secondary | ICD-10-CM | POA: Diagnosis not present

## 2021-09-02 DIAGNOSIS — J189 Pneumonia, unspecified organism: Secondary | ICD-10-CM | POA: Diagnosis not present

## 2021-09-02 DIAGNOSIS — K2100 Gastro-esophageal reflux disease with esophagitis, without bleeding: Secondary | ICD-10-CM | POA: Diagnosis not present

## 2021-09-02 DIAGNOSIS — K267 Chronic duodenal ulcer without hemorrhage or perforation: Secondary | ICD-10-CM | POA: Diagnosis not present

## 2021-09-02 DIAGNOSIS — E559 Vitamin D deficiency, unspecified: Secondary | ICD-10-CM | POA: Diagnosis not present

## 2021-09-09 ENCOUNTER — Encounter (HOSPITAL_COMMUNITY): Payer: Self-pay

## 2021-09-09 ENCOUNTER — Ambulatory Visit (HOSPITAL_COMMUNITY)
Admission: RE | Admit: 2021-09-09 | Discharge: 2021-09-09 | Disposition: A | Discharge status: Home / Self Care | Payer: Medicare Other | Source: Ambulatory Visit | Attending: Emergency Medicine | Admitting: Emergency Medicine

## 2021-09-09 DIAGNOSIS — I2510 Atherosclerotic heart disease of native coronary artery without angina pectoris: Secondary | ICD-10-CM | POA: Insufficient documentation

## 2021-09-09 DIAGNOSIS — I289 Disease of pulmonary vessels, unspecified: Secondary | ICD-10-CM | POA: Insufficient documentation

## 2021-09-09 DIAGNOSIS — R918 Other nonspecific abnormal finding of lung field: Secondary | ICD-10-CM | POA: Insufficient documentation

## 2021-09-09 DIAGNOSIS — J181 Lobar pneumonia, unspecified organism: Secondary | ICD-10-CM | POA: Diagnosis not present

## 2021-09-09 DIAGNOSIS — J439 Emphysema, unspecified: Secondary | ICD-10-CM | POA: Diagnosis not present

## 2021-09-09 DIAGNOSIS — J431 Panlobular emphysema: Secondary | ICD-10-CM | POA: Diagnosis not present

## 2021-09-09 DIAGNOSIS — R059 Cough, unspecified: Secondary | ICD-10-CM | POA: Insufficient documentation

## 2021-09-09 DIAGNOSIS — R0600 Dyspnea, unspecified: Secondary | ICD-10-CM | POA: Insufficient documentation

## 2021-09-13 DIAGNOSIS — N401 Enlarged prostate with lower urinary tract symptoms: Secondary | ICD-10-CM | POA: Diagnosis not present

## 2021-09-13 DIAGNOSIS — I119 Hypertensive heart disease without heart failure: Secondary | ICD-10-CM | POA: Diagnosis not present

## 2021-09-13 DIAGNOSIS — E559 Vitamin D deficiency, unspecified: Secondary | ICD-10-CM | POA: Diagnosis not present

## 2021-09-13 DIAGNOSIS — Z125 Encounter for screening for malignant neoplasm of prostate: Secondary | ICD-10-CM | POA: Diagnosis not present

## 2021-09-13 DIAGNOSIS — R7302 Impaired glucose tolerance (oral): Secondary | ICD-10-CM | POA: Diagnosis not present

## 2021-09-13 DIAGNOSIS — K2100 Gastro-esophageal reflux disease with esophagitis, without bleeding: Secondary | ICD-10-CM | POA: Diagnosis not present

## 2021-09-13 DIAGNOSIS — M1990 Unspecified osteoarthritis, unspecified site: Secondary | ICD-10-CM | POA: Diagnosis not present

## 2021-09-13 DIAGNOSIS — J449 Chronic obstructive pulmonary disease, unspecified: Secondary | ICD-10-CM | POA: Diagnosis not present

## 2021-09-13 DIAGNOSIS — E7800 Pure hypercholesterolemia, unspecified: Secondary | ICD-10-CM | POA: Diagnosis not present

## 2021-09-13 DIAGNOSIS — K267 Chronic duodenal ulcer without hemorrhage or perforation: Secondary | ICD-10-CM | POA: Diagnosis not present

## 2021-09-13 DIAGNOSIS — Z79899 Other long term (current) drug therapy: Secondary | ICD-10-CM | POA: Diagnosis not present

## 2021-09-13 DIAGNOSIS — Z0001 Encounter for general adult medical examination with abnormal findings: Secondary | ICD-10-CM | POA: Diagnosis not present

## 2021-09-26 ENCOUNTER — Ambulatory Visit: Payer: Medicare Other | Admitting: *Deleted

## 2021-09-30 DIAGNOSIS — R7302 Impaired glucose tolerance (oral): Secondary | ICD-10-CM | POA: Diagnosis not present

## 2021-09-30 DIAGNOSIS — Z0001 Encounter for general adult medical examination with abnormal findings: Secondary | ICD-10-CM | POA: Diagnosis not present

## 2021-09-30 DIAGNOSIS — Z79899 Other long term (current) drug therapy: Secondary | ICD-10-CM | POA: Diagnosis not present

## 2021-09-30 DIAGNOSIS — M1990 Unspecified osteoarthritis, unspecified site: Secondary | ICD-10-CM | POA: Diagnosis not present

## 2021-09-30 DIAGNOSIS — N401 Enlarged prostate with lower urinary tract symptoms: Secondary | ICD-10-CM | POA: Diagnosis not present

## 2021-09-30 DIAGNOSIS — E559 Vitamin D deficiency, unspecified: Secondary | ICD-10-CM | POA: Diagnosis not present

## 2021-09-30 DIAGNOSIS — E7800 Pure hypercholesterolemia, unspecified: Secondary | ICD-10-CM | POA: Diagnosis not present

## 2021-09-30 DIAGNOSIS — J449 Chronic obstructive pulmonary disease, unspecified: Secondary | ICD-10-CM | POA: Diagnosis not present

## 2021-09-30 DIAGNOSIS — K2100 Gastro-esophageal reflux disease with esophagitis, without bleeding: Secondary | ICD-10-CM | POA: Diagnosis not present

## 2021-09-30 DIAGNOSIS — Z125 Encounter for screening for malignant neoplasm of prostate: Secondary | ICD-10-CM | POA: Diagnosis not present

## 2021-09-30 DIAGNOSIS — K267 Chronic duodenal ulcer without hemorrhage or perforation: Secondary | ICD-10-CM | POA: Diagnosis not present

## 2021-09-30 DIAGNOSIS — I119 Hypertensive heart disease without heart failure: Secondary | ICD-10-CM | POA: Diagnosis not present

## 2021-10-02 ENCOUNTER — Encounter: Payer: Self-pay | Admitting: Emergency Medicine

## 2021-10-02 ENCOUNTER — Ambulatory Visit (INDEPENDENT_AMBULATORY_CARE_PROVIDER_SITE_OTHER): Payer: Medicare Other | Admitting: Emergency Medicine

## 2021-10-02 DIAGNOSIS — J449 Chronic obstructive pulmonary disease, unspecified: Secondary | ICD-10-CM | POA: Diagnosis not present

## 2021-10-02 DIAGNOSIS — R918 Other nonspecific abnormal finding of lung field: Secondary | ICD-10-CM

## 2021-10-02 NOTE — Assessment & Plan Note (Signed)
Interval improvement on a CT chest, consistent with a resolving infectious process.  Good news.  Discussed with his wife today.  I did recommend a repeat CT in 6 months for evaluation of stability.  She understands and agrees.  There will be value in this even from a prognostic standpoint.  She does indicate that if we do believe he has cancer then we would probably defer any diagnostics, defer any plan for treatment.

## 2021-10-02 NOTE — Assessment & Plan Note (Signed)
Very severe COPD.  Recent exacerbation.  Now back to his previous baseline which is quite limited.  Plan to continue same regimen

## 2021-10-02 NOTE — Addendum Note (Signed)
Addended by: Gavin Potters R on: 10/02/2021 01:40 PM   Modules accepted: Orders

## 2021-10-02 NOTE — Patient Instructions (Addendum)
Reviewed your CT scan of the chest from 09/09/2021.  This shows stable to improved areas that are most consistent with resolving infection.  This is good news. We will plan to repeat your CT scan of the chest in December 2023 to look for stability. Please continue your inhaled medication as you have been using it. Follow Dr. Lamonte Sakai in December to review your scan results together.

## 2021-10-02 NOTE — Progress Notes (Signed)
Subjective:    Patient ID: Blake Mcknight, male    DOB: [birth year], 66 y.o.   MRN: [id number]  HPI 66 year old man with a history of tobacco use (41 pack years), associated emphysematous COPD, chronic hypoxemic respiratory failure on 2 L/min, schizophrenia, hypertension, hyperlipidemia. He was in the emergency department 07/09/21 with an acute exacerbation of his COPD with associated hypoxemia.  He was discharged on prednisone.  Maintenance regimen includes  Referred today for abnormal CT scan of the chest. Feels back to baseline. He is back on breztri and duoneb reliably. On 2L/min O2.   CT scan of the chest from 06/05/2021 at [facility] urology, reviewed by me.  Shows severe extensive bullous emphysematous change especially apically.  There are 2 discrete right middle lobe opacities 1.3 cm and 1.1 cm that are slightly larger than on a previous CT done on 06/20/2019.  There is a new 2.9 x 2.1 cm posterior right upper lobe nodule.  1.9 x 0.9 cm anterior left upper lobe nodule   ROV 08/08/2021 --Blake Mcknight is 66, follows up today for his severe emphysematous COPD with associated chronic hypoxemic respiratory failure as well as pulmonary nodular disease noted on chest imaging.  The scan was done at [facility] urology, showed 2 discrete right middle lobe opacities and a new 2.9 cm posterior right upper lobe nodule.  1.9 cm left upper lobe nodule.  He is quite debilitated and unclear whether he can tolerate biopsy.  He is here today to discuss PET scan. He is having more SOB, more cough w gray to yellow sputum. No real fevers. He is on breztri, albuterol nebs q4h. He is on prednisone '20mg'$  bid for the last 6 months.   PET scan 07/17/2021 reviewed by me shows hypermetabolic subcarinal lymph node, hypermetabolic 2.9 cm cavitary right lower lobe superior segmental nodule (not seen on scan from 3/22), right middle lobe 2.9 cm nodule increased in size from 1.6 cm on recent CT, decrease in size posterior medial  right lower lobe nodularity now 2.5 x 1.6 cm, medial anterior left upper lobe nodule 1.1 x 1.0 cm again decreased in size that is not hypermetabolic.   ROV 10/02/21 --66 year old gentleman with a history of severe emphysematous COPD and associated hypoxemic respiratory failure.  Also with pulmonary nodular disease that we have followed with CT, PET scan.  There is been progressive areas of hypermetabolism, subcarinal lymphadenopathy.  It has been unclear to me whether he can tolerate any diagnostics including EBUS or transbronchial biopsies.  He was hospitalized in early June with AE-COPD and PNA. We repeated a CT chest 09/09/2021 PMH:  schizophrenia, hypertension, hyperlipidemia.  CT chest 09/09/2021 reviewed by me, shows severe panlobular bullous emphysema.  The cavitary right upper lobe lesion has nearly resolved, now 12 x 14 mm consistent with resolving infection.  The right middle lobe nodule is more organized, appears to be stable compared with PET scan on 07/24/2021 now 1.5 x 3.7 cm.  Stable area of focal peribronchovascular consolidation in the right lower lobe 1.7 x 2.7 cm with some airway impaction.  Finally stable 11 x 12 mm focal left upper lobe consolidated area.  The subcarinal node is slightly increased in size, now measuring 18 x 20 mm    Review of Systems As per HPI  Past Medical History:  Diagnosis Date   COPD (chronic obstructive pulmonary disease) ([place])    with bullous emphysema.    Heavy cigarette smoker before 2003   pt claims only 10 cigs per  day, never heavier amounts.    HLD (hyperlipidemia)    Hypertension    Schizophrenia ([place]) 06/28/2013   This is a chronic condition and he lives with family     Family History  Problem Relation Age of Onset   Diabetes Mellitus II Mother    Diabetes Mother    Heart disease Father    Stroke Maternal Aunt    CAD Neg Hx      Social History   Socioeconomic History   Marital status: Single    Spouse name: Not on file   Number  of children: Not on file   Years of education: Not on file   Highest education level: Not on file  Occupational History   Occupation: disabled  Tobacco Use   Smoking status: Former    Packs/day: 1.00    Years: 41.00    Total pack years: 41.00    Types: Cigarettes   Smokeless tobacco: Never   Tobacco comments:    Quit 7 - 8 months ago [facility] 07/17/21  Vaping Use   Vaping Use: Never used  Substance and Sexual Activity   Alcohol use: No   Drug use: No   Sexual activity: Yes  Other Topics Concern   Not on file  Social History Narrative   Not on file   Social Determinants of Health   Financial Resource Strain: Not on file  Food Insecurity: No Food Insecurity (08/07/2020)   Hunger Vital Sign    Worried About Running Out of Food in the Last Year: Never true    [place] in the Last Year: Never true  Transportation Needs: No Transportation Needs (08/07/2020)   PRAPARE - Hydrologist (Medical): No    Lack of Transportation (Non-Medical): No  Physical Activity: Not on file  Stress: Not on file  Social Connections: Not on file  Intimate Partner Violence: Not on file     No Known Allergies   Outpatient Medications Prior to Visit  Medication Sig Dispense Refill   albuterol (VENTOLIN HFA) 108 (90 Base) MCG/ACT inhaler Inhale 2 puffs into the lungs every 4 (four) hours as needed for wheezing or shortness of breath.     amLODipine (NORVASC) 10 MG tablet Take 10 mg by mouth daily.     atorvastatin (LIPITOR) 10 MG tablet Take 10 mg by mouth at bedtime.     benzonatate (TESSALON) 200 MG capsule Take 1 capsule (200 mg total) by mouth 2 (two) times daily as needed for cough. 20 capsule 0   benztropine (COGENTIN) 2 MG tablet Take 1 tablet (2 mg total) by mouth 2 (two) times daily. 180 tablet 1   BREZTRI AEROSPHERE 160-9-4.8 MCG/ACT AERO Inhale 2 puffs into the lungs in the morning and at bedtime.     carvedilol (COREG) 3.125 MG tablet Take 1 tablet (3.125 mg  total) by mouth 2 (two) times daily with a meal. (Patient taking differently: Take 1.5625-3.125 mg by mouth See admin instructions. Take one tablet by mouth daily in the morning and one-half tablet by mouth daily in the evening.)  0   castor oil liquid Take 10 mLs by mouth daily as needed for moderate constipation or mild constipation.     cetirizine (ZYRTEC) 10 MG tablet Take 10 mg by mouth daily as needed for allergies or rhinitis.     cholecalciferol (VITAMIN D) 25 MCG (1000 UNIT) tablet Take 4,000 Units by mouth every evening.      famotidine (PEPCID)  40 MG tablet Take 40 mg by mouth 2 (two) times daily.     HYDROCODONE-CHLORPHENIRAMINE PO Take 5 mLs by mouth every 6 (six) hours as needed (cough and pain).     ipratropium-albuterol (DUONEB) 0.5-2.5 (3) MG/3ML SOLN Take 3 mLs by nebulization every 4 (four) hours.     MILK THISTLE PO Take 1 capsule by mouth daily.     mirtazapine (REMERON) 30 MG tablet Take 1 tablet (30 mg total) by mouth at bedtime. 90 tablet 1   multivitamin (ONE-A-DAY MEN'S) TABS tablet Take 1 tablet by mouth daily.     OVER THE COUNTER MEDICATION Take 2-4 tablets by mouth daily as needed (constipation). Oxy powder     OXYGEN Inhale 2 L/min into the lungs continuous.      polyethylene glycol (MIRALAX / GLYCOLAX) 17 g packet Take 17 g by mouth daily as needed for mild constipation.     risperiDONE (RISPERDAL) 3 MG tablet Take 1 tablet (3 mg total) by mouth 2 (two) times daily. 180 tablet 1   sodium chloride (OCEAN) 0.65 % SOLN nasal spray Place 1 spray into both nostrils as needed for congestion.     tamsulosin (FLOMAX) 0.4 MG CAPS capsule Take 0.8 mg by mouth at bedtime.     No facility-administered medications prior to visit.        Objective:   Physical Exam Vitals:   10/02/21 1210  BP: 128/68  Pulse: (!) 105  Temp: 97.8 F (36.6 C)  TempSrc: Oral  SpO2: 97%  Weight: 150 lb (68 kg)  Height: '5\' 6"'$  (1.676 m)    Gen: Debilitated man in a wheelchair,  well-nourished, in no distress, depressed affect  ENT: No lesions,  mouth clear,  oropharynx clear, no postnasal drip  Neck: No JVD, no stridor  Lungs: No use of accessory muscles, very distant, no crackles or wheezing on normal respiration, no wheeze on forced expiration  Cardiovascular: RRR, heart sounds normal, no murmur or gallops, no peripheral edema  Musculoskeletal: No deformities, no cyanosis or clubbing  Neuro: alert, awake, non focal  Skin: Warm, no lesions or rash      Assessment & Plan:  Pulmonary nodules Interval improvement on a CT chest, consistent with a resolving infectious process.  Good news.  Discussed with his wife today.  I did recommend a repeat CT in 6 months for evaluation of stability.  She understands and agrees.  There will be value in this even from a prognostic standpoint.  She does indicate that if we do believe he has cancer then we would probably defer any diagnostics, defer any plan for treatment.  COPD (chronic obstructive pulmonary disease) (HCC) Very severe COPD.  Recent exacerbation.  Now back to his previous baseline which is quite limited.  Plan to continue same regimen   Baltazar Apo, MD, PhD 10/02/2021, 12:30 PM [facility] [phone] or if no answer before 7:00PM call [phone] For any issues after 7:00PM please call eLink [phone]

## 2021-10-03 ENCOUNTER — Ambulatory Visit: Payer: Self-pay | Admitting: *Deleted

## 2021-10-03 NOTE — Patient Outreach (Signed)
  Care Coordination   Follow Up Visit Note   10/03/2021 Name: Blake Mcknight MRN: [id number] DOB: September 25, 1955  Blake Mcknight is a 66 y.o. year old male who sees Blake Liberty, MD for primary care. I spoke with sister of Blake Mcknight by phone today  What matters to the patients health and wellness today?  Stability of COPD, repeat scans in December to rule out cancer.   Goals Addressed               This Visit's Progress     (per sister) Remain out of hospital (pt-stated)        Care Coordination Interventions: Advised patient to track and manage COPD triggers Advised patient to self assesses COPD action plan zone and make appointment with provider if in the yellow zone for 48 hours without improvement Provided education about and advised patient to utilize infection prevention strategies to reduce risk of respiratory infection         SDOH assessments and interventions completed:   Yes   Care Coordination Interventions Activated:  Yes Care Coordination Interventions:   Yes, provided  Follow up plan: No further intervention required. Member has remained stable and out of hospital for greater then 30 days.  Encounter Outcome:  Pt. Visit Completed   [name], RN, MSN, CCM RN Care Coordinator [phone]

## 2021-10-22 ENCOUNTER — Encounter (HOSPITAL_COMMUNITY): Payer: Self-pay | Admitting: Psychiatry

## 2021-10-22 ENCOUNTER — Telehealth (HOSPITAL_BASED_OUTPATIENT_CLINIC_OR_DEPARTMENT_OTHER): Payer: Medicare Other | Admitting: Psychiatry

## 2021-10-22 DIAGNOSIS — F99 Mental disorder, not otherwise specified: Secondary | ICD-10-CM

## 2021-10-22 DIAGNOSIS — F5105 Insomnia due to other mental disorder: Secondary | ICD-10-CM

## 2021-10-22 DIAGNOSIS — F200 Paranoid schizophrenia: Secondary | ICD-10-CM | POA: Diagnosis not present

## 2021-10-22 MED ORDER — BENZTROPINE MESYLATE 2 MG PO TABS: 2.0000 mg | ORAL_TABLET | Freq: Two times a day (BID) | ORAL | 1 refills | Status: DC

## 2021-10-22 MED ORDER — MIRTAZAPINE 30 MG PO TABS: 30.0000 mg | ORAL_TABLET | Freq: Every day | ORAL | 1 refills | Status: DC

## 2021-10-22 MED ORDER — RISPERIDONE 3 MG PO TABS: 3.0000 mg | ORAL_TABLET | Freq: Two times a day (BID) | ORAL | 1 refills | Status: DC

## 2021-10-22 NOTE — Progress Notes (Signed)
Virtual Visit via Telephone Note  I connected with Blake Mcknight on 10/22/21 at  2:00 PM EDT by telephone and verified that I am speaking with the correct person using two identifiers.  Location: Patient: Home Provider: Home Office   I discussed the limitations, risks, security and privacy concerns of performing an evaluation and management service by telephone and the availability of in person appointments. I also discussed with the patient that there may be a patient responsible charge related to this service. The patient expressed understanding and agreed to proceed.   History of Present Illness: Patient is evaluated by phone session.  He is a poor historian but his sister who was available helped him to communicate.  Patient was recently admitted in the hospital because of exacerbation of COPD.  Patient endorsed that lately he feels sometimes anxious and nervous because he is not sure how long he is going to live.  He is worried about his chronic health issues especially shortness of breath.  However he denies any new stressors.  He has chronic hallucination and paranoia but it is stable.  He is sleeping good.  His appetite is fair.  He denies any agitation, anger, mania, suicidal thoughts.  His sister is very supportive.  He does talk to himself but he is not agitated or angry.  With the help of medication he sleeps at least 7 to 8 hours.    Past Psychiatric History: Reviewed. H/O schizophrenia and admitted at [facility] 25 years ago. H/O paranoid, delusional, disorganized behavior and having mood swings.  Psychiatric Specialty Exam: Physical Exam  Review of Systems  Weight 150 lb (68 kg).There is no height or weight on file to calculate BMI.  General Appearance: NA  Eye Contact:  NA  Speech:  Slow  Volume:  Decreased  Mood:  Dysphoric  Affect:  NA  Thought Process:  Descriptions of Associations: Intact  Orientation:  Full (Time, Place, and Person)  Thought Content:   Hallucinations: chronic seeing shadows and Paranoid Ideation  Suicidal Thoughts:  No  Homicidal Thoughts:  No  Memory:  Immediate;   Fair Recent;   Fair Remote;   Fair  Judgement:  Fair  Insight:  Shallow  Psychomotor Activity:  NA  Concentration:  Concentration: Fair and Attention Span: Fair  Recall:  [employer] of Knowledge:  Poor  Language:  Fair  Akathisia:  No  Handed:  Right  AIMS (if indicated):     Assets:  Housing Social Support  ADL's:  Intact  Cognition:  Impaired,  Mild  Sleep:   ok      Assessment and Plan: Schizophrenia chronic paranoid type.  Insomnia due to mental disorder.  I reviewed blood work results.  Patient psychiatrically stable.  Though he is concerned about his general physical health as having exacerbation of COPD and sometime he has shortness of breath.  We talk about cutting down the medication and in the past him and his sister was very concerned but after some discussion patient agree to cut down the Risperdal.  His sister still wants Risperdal to be given twice a day but she will try on her own to give only at bedtime.  All the medicines are managed by his sister.  We will continue Cogentin 2 mg twice a day, mirtazapine 30 mg at bedtime.  I recommend if she cut down the respite I will to be given only at bedtime then Cogentin does not need to be taken twice a day.  Patient's sister agree with the plan.  She will call us if she has any question or any concern.  We will follow up in 6 months.   Follow Up Instructions:    I discussed the assessment and treatment plan with the patient. The patient was provided an opportunity to ask questions and all were answered. The patient agreed with the plan and demonstrated an understanding of the instructions.   The patient was advised to call back or seek an in-person evaluation if the symptoms worsen or if the condition fails to improve as anticipated.  Collaboration of Care: Other provider involved in  patient's care AEB notes are available in epic to review.  Patient/Guardian was advised Release of Information must be obtained prior to any record release in order to collaborate their care with an outside provider. Patient/Guardian was advised if they have not already done so to contact the registration department to sign all necessary forms in order for [place] to release information regarding their care.   Consent: Patient/Guardian gives verbal consent for treatment and assignment of benefits for services provided during this visit. Patient/Guardian expressed understanding and agreed to proceed.    I provided 21 minutes of non-face-to-face time during this encounter.   Kathlee Nations, MD

## 2021-10-31 ENCOUNTER — Other Ambulatory Visit: Payer: Self-pay | Admitting: *Deleted

## 2021-10-31 ENCOUNTER — Encounter: Payer: Self-pay | Admitting: *Deleted

## 2021-10-31 NOTE — Patient Outreach (Signed)
  Care Coordination   Initial Visit Note   10/31/2021 Name: Blake Mcknight MRN: [id number] DOB: 1955-04-23  Blake Mcknight is a 66 y.o. year old male who sees [name], MD for primary care. I spoke with  [name], sister of Blake Mcknight by phone today  What matters to the patients health and wellness today?  Management of COPD.  Encouraged and reminded of need for AWV.      Goals Addressed               This Visit's Progress     (per sister) Remain out of hospital (pt-stated)   On track     Care Coordination Interventions: Advised patient to track and manage COPD triggers Advised patient to self assesses COPD action plan zone and make appointment with provider if in the yellow zone for 48 hours without improvement Provided education about and advised patient to utilize infection prevention strategies to reduce risk of respiratory infection   8/17 - Has remained stable, no hospitalizations since June.  Per sister, does not have any current shortness of breath        SDOH assessments and interventions completed:  Yes  SDOH Interventions Today    Flowsheet Row Most Recent Value  SDOH Interventions   Food Insecurity Interventions Intervention Not Indicated  Housing Interventions Intervention Not Indicated  Transportation Interventions Intervention Not Indicated        Care Coordination Interventions Activated:  Yes  Care Coordination Interventions:  Yes, provided   Follow up plan:  Will collaborate with newly assigned RNCM for follow up plan    Encounter Outcome:  Pt. Visit Completed   [name], RN, MSN, [place] Coordinator [phone]

## 2021-11-05 ENCOUNTER — Telehealth: Payer: Self-pay | Admitting: *Deleted

## 2021-11-05 NOTE — Patient Outreach (Signed)
  Care Coordination   11/05/2021 Name: Blake Mcknight MRN: [id number] DOB: [birth year]   Care Coordination Outreach Attempts:  An unsuccessful telephone outreach was attempted today to offer the patient information about available care coordination services as a benefit of their health plan.   RN care manager left voicemail requesting return phone call to schedule telephone follow up outreach.  Follow Up Plan:  Additional outreach attempts will be made to offer the patient care coordination information and services.   Encounter Outcome:  No Answer  Care Coordination Interventions Activated:  No   Care Coordination Interventions:  No, not indicated    Jacqlyn Larsen [facility], BSN RN Case Manager [phone]

## 2021-12-05 ENCOUNTER — Telehealth: Payer: Self-pay | Admitting: *Deleted

## 2021-12-05 NOTE — Patient Outreach (Signed)
  Care Coordination   12/05/2021 Name: RAKEEN GAILLARD MRN: [id number] DOB: Dec 29, 1955   Care Coordination Outreach Attempts:  A second unsuccessful outreach was attempted today to offer the patient with information about available care coordination services as a benefit of their health plan.     Follow Up Plan:  Additional outreach attempts will be made to offer the patient care coordination information and services.   Encounter Outcome:  No Answer  Care Coordination Interventions Activated:  No   Care Coordination Interventions:  No, not indicated    Jacqlyn Larsen [facility], [place] RN Care Coordinator [phone]

## 2021-12-08 DIAGNOSIS — F319 Bipolar disorder, unspecified: Secondary | ICD-10-CM | POA: Diagnosis not present

## 2021-12-08 DIAGNOSIS — J441 Chronic obstructive pulmonary disease with (acute) exacerbation: Secondary | ICD-10-CM | POA: Diagnosis not present

## 2021-12-08 DIAGNOSIS — M1990 Unspecified osteoarthritis, unspecified site: Secondary | ICD-10-CM | POA: Diagnosis not present

## 2021-12-08 DIAGNOSIS — K2100 Gastro-esophageal reflux disease with esophagitis, without bleeding: Secondary | ICD-10-CM | POA: Diagnosis not present

## 2021-12-08 DIAGNOSIS — K267 Chronic duodenal ulcer without hemorrhage or perforation: Secondary | ICD-10-CM | POA: Diagnosis not present

## 2021-12-08 DIAGNOSIS — R7302 Impaired glucose tolerance (oral): Secondary | ICD-10-CM | POA: Diagnosis not present

## 2021-12-08 DIAGNOSIS — E559 Vitamin D deficiency, unspecified: Secondary | ICD-10-CM | POA: Diagnosis not present

## 2021-12-08 DIAGNOSIS — I119 Hypertensive heart disease without heart failure: Secondary | ICD-10-CM | POA: Diagnosis not present

## 2021-12-08 DIAGNOSIS — Z23 Encounter for immunization: Secondary | ICD-10-CM | POA: Diagnosis not present

## 2021-12-08 DIAGNOSIS — E7800 Pure hypercholesterolemia, unspecified: Secondary | ICD-10-CM | POA: Diagnosis not present

## 2021-12-08 DIAGNOSIS — N401 Enlarged prostate with lower urinary tract symptoms: Secondary | ICD-10-CM | POA: Diagnosis not present

## 2021-12-08 DIAGNOSIS — Z79899 Other long term (current) drug therapy: Secondary | ICD-10-CM | POA: Diagnosis not present

## 2022-01-07 DIAGNOSIS — R7302 Impaired glucose tolerance (oral): Secondary | ICD-10-CM | POA: Diagnosis not present

## 2022-01-07 DIAGNOSIS — E559 Vitamin D deficiency, unspecified: Secondary | ICD-10-CM | POA: Diagnosis not present

## 2022-01-07 DIAGNOSIS — F319 Bipolar disorder, unspecified: Secondary | ICD-10-CM | POA: Diagnosis not present

## 2022-01-07 DIAGNOSIS — I119 Hypertensive heart disease without heart failure: Secondary | ICD-10-CM | POA: Diagnosis not present

## 2022-01-07 DIAGNOSIS — K2100 Gastro-esophageal reflux disease with esophagitis, without bleeding: Secondary | ICD-10-CM | POA: Diagnosis not present

## 2022-01-07 DIAGNOSIS — Z79899 Other long term (current) drug therapy: Secondary | ICD-10-CM | POA: Diagnosis not present

## 2022-01-07 DIAGNOSIS — N401 Enlarged prostate with lower urinary tract symptoms: Secondary | ICD-10-CM | POA: Diagnosis not present

## 2022-01-07 DIAGNOSIS — E7800 Pure hypercholesterolemia, unspecified: Secondary | ICD-10-CM | POA: Diagnosis not present

## 2022-01-07 DIAGNOSIS — M1990 Unspecified osteoarthritis, unspecified site: Secondary | ICD-10-CM | POA: Diagnosis not present

## 2022-01-07 DIAGNOSIS — K267 Chronic duodenal ulcer without hemorrhage or perforation: Secondary | ICD-10-CM | POA: Diagnosis not present

## 2022-01-07 DIAGNOSIS — Z23 Encounter for immunization: Secondary | ICD-10-CM | POA: Diagnosis not present

## 2022-01-07 DIAGNOSIS — J441 Chronic obstructive pulmonary disease with (acute) exacerbation: Secondary | ICD-10-CM | POA: Diagnosis not present

## 2022-02-24 ENCOUNTER — Ambulatory Visit (HOSPITAL_COMMUNITY)
Admission: RE | Admit: 2022-02-24 | Discharge: 2022-02-24 | Disposition: A | Discharge status: Home / Self Care | Payer: Medicare Other | Source: Ambulatory Visit | Attending: Emergency Medicine | Admitting: Emergency Medicine

## 2022-02-24 DIAGNOSIS — R590 Localized enlarged lymph nodes: Secondary | ICD-10-CM | POA: Diagnosis not present

## 2022-02-24 DIAGNOSIS — K760 Fatty (change of) liver, not elsewhere classified: Secondary | ICD-10-CM | POA: Insufficient documentation

## 2022-02-24 DIAGNOSIS — R918 Other nonspecific abnormal finding of lung field: Secondary | ICD-10-CM | POA: Diagnosis not present

## 2022-02-24 DIAGNOSIS — I700 Atherosclerosis of aorta: Secondary | ICD-10-CM | POA: Insufficient documentation

## 2022-02-24 DIAGNOSIS — J439 Emphysema, unspecified: Secondary | ICD-10-CM | POA: Diagnosis not present

## 2022-02-27 ENCOUNTER — Ambulatory Visit: Payer: Medicare Other | Admitting: Emergency Medicine

## 2022-03-05 ENCOUNTER — Ambulatory Visit (INDEPENDENT_AMBULATORY_CARE_PROVIDER_SITE_OTHER): Payer: Medicare Other | Admitting: Emergency Medicine

## 2022-03-05 ENCOUNTER — Encounter: Payer: Self-pay | Admitting: Emergency Medicine

## 2022-03-05 VITALS — BP 118/62 | HR 116 | Temp 97.6°F | Ht 66.0 in | Wt 157.0 lb

## 2022-03-05 DIAGNOSIS — J9611 Chronic respiratory failure with hypoxia: Secondary | ICD-10-CM | POA: Diagnosis not present

## 2022-03-05 DIAGNOSIS — J449 Chronic obstructive pulmonary disease, unspecified: Secondary | ICD-10-CM

## 2022-03-05 DIAGNOSIS — R9389 Abnormal findings on diagnostic imaging of other specified body structures: Secondary | ICD-10-CM | POA: Diagnosis not present

## 2022-03-05 NOTE — Progress Notes (Signed)
Subjective:    Patient ID: Blake Mcknight, male    DOB: 12-Aug-1955, 66 y.o.   MRN: [id number]  HPI  ROV 10/02/21 --66 year old gentleman with a history of severe emphysematous COPD and associated hypoxemic respiratory failure.  Also with pulmonary nodular disease that we have followed with CT, PET scan.  There is been progressive areas of hypermetabolism, subcarinal lymphadenopathy.  It has been unclear to me whether he can tolerate any diagnostics including EBUS or transbronchial biopsies.  He was hospitalized in early June with AE-COPD and PNA. We repeated a CT chest 09/09/2021 PMH:  schizophrenia, hypertension, hyperlipidemia.  CT chest 09/09/2021 reviewed by me, shows severe panlobular bullous emphysema.  The cavitary right upper lobe lesion has nearly resolved, now 12 x 14 mm consistent with resolving infection.  The right middle lobe nodule is more organized, appears to be stable compared with PET scan on 07/24/2021 now 1.5 x 3.7 cm.  Stable area of focal peribronchovascular consolidation in the right lower lobe 1.7 x 2.7 cm with some airway impaction.  Finally stable 11 x 12 mm focal left upper lobe consolidated area.  The subcarinal node is slightly increased in size, now measuring 18 x 20 mm  ROV 03/05/22 --66 year old man with a history of severe emphysematous COPD, hypoxemic respiratory failure and pulmonary nodular disease.  We contemplated possible diagnostics including EBUS or transbronchial biopsies but was unclear whether he can tolerate these due to his overall debility and marginal respiratory status.  His most recent CT in June showed almost complete resolution of his right upper lobe cavitary nodule, some organization of his right middle lobe nodule, stable right lower lobe and focal left upper lobe nodules as well.  We decided to repeat CT to follow and this was done 02/24/2022 as below.  Based on past discussions with the patient and his wife is not clear that he would want any  kind of diagnostics or treatment even if we did believe he had malignancy. He is currently on Breztri, uses DuoNeb several times a day.   CT chest 02/24/2022 reviewed by me shows interval enlargement of his subcarinal lymphadenopathy, now 2.7 cm from 1.8 cm previously this node had been hypermetabolic on prior PET scan.  Advanced emphysema.  Overall areas of abnormality noted are stable.  The consolidative right middle lobe opacity is actually smaller, now 2.5 x 1.6 cm    Review of Systems As per HPI  Past Medical History:  Diagnosis Date   COPD (chronic obstructive pulmonary disease) ([place])    with bullous emphysema.    Heavy cigarette smoker before 2003   pt claims only 10 cigs per day, never heavier amounts.    HLD (hyperlipidemia)    Hypertension    Schizophrenia ([place]) 06/28/2013   This is a chronic condition and he lives with family     Family History  Problem Relation Age of Onset   Diabetes Mellitus II Mother    Diabetes Mother    Heart disease Father    Stroke Maternal Aunt    CAD Neg Hx      Social History   Socioeconomic History   Marital status: Single    Spouse name: Not on file   Number of children: Not on file   Years of education: Not on file   Highest education level: Not on file  Occupational History   Occupation: disabled  Tobacco Use   Smoking status: Former    Packs/day: 1.00    Years: 41.00  Total pack years: 41.00    Types: Cigarettes   Smokeless tobacco: Never   Tobacco comments:    Quit 7 - 8 months ago [facility] 07/17/21  Vaping Use   Vaping Use: Never used  Substance and Sexual Activity   Alcohol use: No   Drug use: No   Sexual activity: Yes  Other Topics Concern   Not on file  Social History Narrative   Not on file   Social Determinants of Health   Financial Resource Strain: Not on file  Food Insecurity: No Food Insecurity (10/31/2021)   Hunger Vital Sign    Worried About Running Out of Food in the Last Year: Never true    Ran Out  of Food in the Last Year: Never true  Transportation Needs: No Transportation Needs (10/31/2021)   PRAPARE - Hydrologist (Medical): No    Lack of Transportation (Non-Medical): No  Physical Activity: Not on file  Stress: Not on file  Social Connections: Not on file  Intimate Partner Violence: Not on file     No Known Allergies   Outpatient Medications Prior to Visit  Medication Sig Dispense Refill   albuterol (VENTOLIN HFA) 108 (90 Base) MCG/ACT inhaler Inhale 2 puffs into the lungs every 4 (four) hours as needed for wheezing or shortness of breath.     amLODipine (NORVASC) 10 MG tablet Take 10 mg by mouth daily.     atorvastatin (LIPITOR) 10 MG tablet Take 10 mg by mouth at bedtime.     benzonatate (TESSALON) 200 MG capsule Take 1 capsule (200 mg total) by mouth 2 (two) times daily as needed for cough. 20 capsule 0   benztropine (COGENTIN) 2 MG tablet Take 1 tablet (2 mg total) by mouth 2 (two) times daily. 180 tablet 1   BREZTRI AEROSPHERE 160-9-4.8 MCG/ACT AERO Inhale 2 puffs into the lungs in the morning and at bedtime.     carvedilol (COREG) 3.125 MG tablet Take 1 tablet (3.125 mg total) by mouth 2 (two) times daily with a meal. (Patient taking differently: Take 1.5625-3.125 mg by mouth See admin instructions. Take one tablet by mouth daily in the morning and one-half tablet by mouth daily in the evening.)  0   castor oil liquid Take 10 mLs by mouth daily as needed for moderate constipation or mild constipation.     cetirizine (ZYRTEC) 10 MG tablet Take 10 mg by mouth daily as needed for allergies or rhinitis.     cholecalciferol (VITAMIN D) 25 MCG (1000 UNIT) tablet Take 4,000 Units by mouth every evening.      famotidine (PEPCID) 40 MG tablet Take 40 mg by mouth 2 (two) times daily.     HYDROCODONE-CHLORPHENIRAMINE PO Take 5 mLs by mouth every 6 (six) hours as needed (cough and pain).     ipratropium-albuterol (DUONEB) 0.5-2.5 (3) MG/3ML SOLN Take 3 mLs by  nebulization every 4 (four) hours.     MILK THISTLE PO Take 1 capsule by mouth daily.     mirtazapine (REMERON) 30 MG tablet Take 1 tablet (30 mg total) by mouth at bedtime. 90 tablet 1   multivitamin (ONE-A-DAY MEN'S) TABS tablet Take 1 tablet by mouth daily.     OVER THE COUNTER MEDICATION Take 2-4 tablets by mouth daily as needed (constipation). Oxy powder     OXYGEN Inhale 2 L/min into the lungs continuous.      polyethylene glycol (MIRALAX / GLYCOLAX) 17 g packet Take 17 g by mouth  daily as needed for mild constipation.     predniSONE (DELTASONE) 10 MG tablet Take 10 mg by mouth 2 (two) times daily.     risperiDONE (RISPERDAL) 3 MG tablet Take 1 tablet (3 mg total) by mouth 2 (two) times daily. 180 tablet 1   sodium chloride (OCEAN) 0.65 % SOLN nasal spray Place 1 spray into both nostrils as needed for congestion.     tamsulosin (FLOMAX) 0.4 MG CAPS capsule Take 0.8 mg by mouth at bedtime.     No facility-administered medications prior to visit.        Objective:   Physical Exam Vitals:   03/05/22 1114  BP: 118/62  Pulse: (!) 116  Temp: 97.6 F (36.4 C)  TempSrc: Oral  SpO2: 100%  Weight: 157 lb (71.2 kg)  Height: '5\' 6"'$  (1.676 m)    Gen: Debilitated man in a wheelchair, well-nourished, in no distress, depressed affect  ENT: No lesions,  mouth clear,  oropharynx clear, no postnasal drip  Neck: No JVD, no stridor  Lungs: No use of accessory muscles, very distant, no crackles or wheezing on normal respiration, no wheeze on forced expiration  Cardiovascular: RRR, heart sounds normal, no murmur or gallops, no peripheral edema  Musculoskeletal: No deformities, no cyanosis or clubbing  Neuro: Awake, alert, will answer some simple questions.  Mild dysarthria  Skin: Warm, no lesions or rash      Assessment & Plan:  Abnormal CT of the chest The scattered consolidative findings that we have followed on CT chest are actually stable to smaller.  I did explain to them that  he has significant enlargement of subcarinal lymph node that was hypermetabolic on his prior PET scan, suspicious for malignancy.  Patient's wife is still contemplating whether she would want bronchoscopy for diagnostics or even treatment if we were to prove that this was malignancy.  She understands the risk associated with general anesthesia given his severe emphysema and debilitated state.  In the end we decided to repeat his CT scan in 3 months to look for possible resolution of the node, interval increase in size.  At that time we will have to revisit either bronchoscopy for biopsies or conservative management.  COPD (chronic obstructive pulmonary disease) ([place]) Continue his Breztri, DuoNeb as needed  Chronic respiratory failure with hypoxia ([place]) Continue oxygen as ordered at all times  Time spent 33 minutes  Baltazar Apo, MD, PhD 03/05/2022, 11:39 AM [facility] [phone] or if no answer before 7:00PM call [phone] For any issues after 7:00PM please call eLink [phone]

## 2022-03-05 NOTE — Assessment & Plan Note (Signed)
Continue oxygen as ordered at all times

## 2022-03-05 NOTE — Patient Instructions (Signed)
We discussed your CT scan of the chest today.  There is an enlarging lymph node in the middle of the chest that is suspicious.  We discussed possible bronchoscopy and biopsies, but know that there is significant risk associated with this procedure due to your emphysema/COPD. We will plan for a repeat CT scan of the chest in March 2024 to compare with priors. Please continue Breztri 2 puffs twice a day.  Rinse and gargle after using. Continue your DuoNeb as you have been taking it Continue oxygen at all times Follow Dr. Lamonte Sakai in March after your CT so we can review the results together.

## 2022-03-05 NOTE — Addendum Note (Signed)
Addended by: Gavin Potters R on: 03/05/2022 01:39 PM   Modules accepted: Orders

## 2022-03-05 NOTE — Assessment & Plan Note (Signed)
The scattered consolidative findings that we have followed on CT chest are actually stable to smaller.  I did explain to them that he has significant enlargement of subcarinal lymph node that was hypermetabolic on his prior PET scan, suspicious for malignancy.  Patient's wife is still contemplating whether she would want bronchoscopy for diagnostics or even treatment if we were to prove that this was malignancy.  She understands the risk associated with general anesthesia given his severe emphysema and debilitated state.  In the end we decided to repeat his CT scan in 3 months to look for possible resolution of the node, interval increase in size.  At that time we will have to revisit either bronchoscopy for biopsies or conservative management.

## 2022-03-05 NOTE — Assessment & Plan Note (Signed)
Continue his Rayann Heman as needed

## 2022-04-09 DIAGNOSIS — E559 Vitamin D deficiency, unspecified: Secondary | ICD-10-CM | POA: Diagnosis not present

## 2022-04-09 DIAGNOSIS — Z79899 Other long term (current) drug therapy: Secondary | ICD-10-CM | POA: Diagnosis not present

## 2022-04-09 DIAGNOSIS — J441 Chronic obstructive pulmonary disease with (acute) exacerbation: Secondary | ICD-10-CM | POA: Diagnosis not present

## 2022-04-09 DIAGNOSIS — M1990 Unspecified osteoarthritis, unspecified site: Secondary | ICD-10-CM | POA: Diagnosis not present

## 2022-04-09 DIAGNOSIS — K267 Chronic duodenal ulcer without hemorrhage or perforation: Secondary | ICD-10-CM | POA: Diagnosis not present

## 2022-04-09 DIAGNOSIS — E7800 Pure hypercholesterolemia, unspecified: Secondary | ICD-10-CM | POA: Diagnosis not present

## 2022-04-09 DIAGNOSIS — I119 Hypertensive heart disease without heart failure: Secondary | ICD-10-CM | POA: Diagnosis not present

## 2022-04-09 DIAGNOSIS — K2100 Gastro-esophageal reflux disease with esophagitis, without bleeding: Secondary | ICD-10-CM | POA: Diagnosis not present

## 2022-04-09 DIAGNOSIS — M2550 Pain in unspecified joint: Secondary | ICD-10-CM | POA: Diagnosis not present

## 2022-04-09 DIAGNOSIS — N401 Enlarged prostate with lower urinary tract symptoms: Secondary | ICD-10-CM | POA: Diagnosis not present

## 2022-04-09 DIAGNOSIS — R7302 Impaired glucose tolerance (oral): Secondary | ICD-10-CM | POA: Diagnosis not present

## 2022-04-09 DIAGNOSIS — F319 Bipolar disorder, unspecified: Secondary | ICD-10-CM | POA: Diagnosis not present

## 2022-04-19 ENCOUNTER — Other Ambulatory Visit (HOSPITAL_COMMUNITY): Payer: Self-pay | Admitting: Psychiatry

## 2022-04-19 DIAGNOSIS — F200 Paranoid schizophrenia: Secondary | ICD-10-CM

## 2022-04-22 DIAGNOSIS — E7800 Pure hypercholesterolemia, unspecified: Secondary | ICD-10-CM | POA: Diagnosis not present

## 2022-04-22 DIAGNOSIS — N401 Enlarged prostate with lower urinary tract symptoms: Secondary | ICD-10-CM | POA: Diagnosis not present

## 2022-04-22 DIAGNOSIS — K2100 Gastro-esophageal reflux disease with esophagitis, without bleeding: Secondary | ICD-10-CM | POA: Diagnosis not present

## 2022-04-22 DIAGNOSIS — K267 Chronic duodenal ulcer without hemorrhage or perforation: Secondary | ICD-10-CM | POA: Diagnosis not present

## 2022-04-22 DIAGNOSIS — F319 Bipolar disorder, unspecified: Secondary | ICD-10-CM | POA: Diagnosis not present

## 2022-04-22 DIAGNOSIS — E559 Vitamin D deficiency, unspecified: Secondary | ICD-10-CM | POA: Diagnosis not present

## 2022-04-22 DIAGNOSIS — Z79899 Other long term (current) drug therapy: Secondary | ICD-10-CM | POA: Diagnosis not present

## 2022-04-22 DIAGNOSIS — M2550 Pain in unspecified joint: Secondary | ICD-10-CM | POA: Diagnosis not present

## 2022-04-22 DIAGNOSIS — I119 Hypertensive heart disease without heart failure: Secondary | ICD-10-CM | POA: Diagnosis not present

## 2022-04-22 DIAGNOSIS — M1990 Unspecified osteoarthritis, unspecified site: Secondary | ICD-10-CM | POA: Diagnosis not present

## 2022-04-22 DIAGNOSIS — J441 Chronic obstructive pulmonary disease with (acute) exacerbation: Secondary | ICD-10-CM | POA: Diagnosis not present

## 2022-04-22 DIAGNOSIS — R7302 Impaired glucose tolerance (oral): Secondary | ICD-10-CM | POA: Diagnosis not present

## 2022-04-24 ENCOUNTER — Telehealth (HOSPITAL_BASED_OUTPATIENT_CLINIC_OR_DEPARTMENT_OTHER): Payer: Medicare Other | Admitting: Psychiatry

## 2022-04-24 ENCOUNTER — Encounter (HOSPITAL_COMMUNITY): Payer: Self-pay | Admitting: Psychiatry

## 2022-04-24 DIAGNOSIS — F200 Paranoid schizophrenia: Secondary | ICD-10-CM

## 2022-04-24 DIAGNOSIS — F99 Mental disorder, not otherwise specified: Secondary | ICD-10-CM | POA: Diagnosis not present

## 2022-04-24 DIAGNOSIS — F5105 Insomnia due to other mental disorder: Secondary | ICD-10-CM | POA: Diagnosis not present

## 2022-04-24 MED ORDER — RISPERIDONE 3 MG PO TABS: 3.0000 mg | ORAL_TABLET | Freq: Two times a day (BID) | ORAL | 1 refills | Status: DC

## 2022-04-24 MED ORDER — BENZTROPINE MESYLATE 2 MG PO TABS: 2.0000 mg | ORAL_TABLET | Freq: Two times a day (BID) | ORAL | 1 refills | Status: DC

## 2022-04-24 MED ORDER — MIRTAZAPINE 30 MG PO TABS: 30.0000 mg | ORAL_TABLET | Freq: Every evening | ORAL | 0 refills | Status: DC | PRN

## 2022-04-24 NOTE — Progress Notes (Signed)
Virtual Visit via Video Note  I connected with Blake Mcknight on 04/24/22 at  2:00 PM EST by a video enabled telemedicine application and verified that I am speaking with the correct person using two identifiers.  Location: Patient: Home Provider: Office   I discussed the limitations of evaluation and management by telemedicine and the availability of in person appointments. The patient expressed understanding and agreed to proceed.  History of Present Illness: Patient is evaluated by video session.  His sister helped this visit as patient is a poor historian.  As per sister patient doing well and does not involved in any agitation, anger, mood swing.  He does talk to himself some time but denies any suicidal thoughts or any homicidal thoughts.  He is taking mirtazapine some nights when he cannot sleep otherwise his sleep is improved from the past.  He has mild tremors and he takes Cogentin.  He requires help for his ADLs.  He does not drive but with his sister he does go outside.  He has chronic paranoia but symptoms are manageable.  Patient and his sister wants to keep the current medication.  Past Psychiatric History: Reviewed. H/O schizophrenia and admitted at [facility] years ago. H/O paranoid, delusional, disorganized behavior and having mood swings.  Psychiatric Specialty Exam: Physical Exam  Review of Systems  Weight 155 lb (70.3 kg).There is no height or weight on file to calculate BMI.  General Appearance: Fairly Groomed  Eye Contact:  Fair  Speech:  Slow  Volume:  Decreased  Mood:  Dysphoric  Affect:  Labile  Thought Process:  Descriptions of Associations: Intact  Orientation:  Full (Time, Place, and Person)  Thought Content:  Rumination  Suicidal Thoughts:  No  Homicidal Thoughts:  No  Memory:  Immediate;   Fair Recent;   Fair Remote;   Fair  Judgement:  Fair  Insight:  Shallow  Psychomotor Activity:  Tremor  Concentration:  Concentration: Fair and Attention  Span: Fair  Recall:  [employer] of Knowledge:  Fair  Language:  Fair  Akathisia:  No  Handed:  Right  AIMS (if indicated):     Assets:  Desire for Improvement Housing  ADL's:  Impaired  Cognition:  Impaired,  Mild  Sleep:   ok      Assessment and Plan: Schizophrenia chronic paranoid type.  Insomnia due to mental disorder.  Patient symptoms are stable.  Patient and his sister does not want to change the medication.  Continue Cogentin 2 mg 2 times a day, Risperdal 3 mg 2 times a day.  We will change mirtazapine to take as needed when he cannot sleep but keep the current dose of 30 mg.  Recommended to call us back if is any question or any concern.  Follow-up in 6 months.  Follow Up Instructions:    I discussed the assessment and treatment plan with the patient. The patient was provided an opportunity to ask questions and all were answered. The patient agreed with the plan and demonstrated an understanding of the instructions.   The patient was advised to call back or seek an in-person evaluation if the symptoms worsen or if the condition fails to improve as anticipated.  Collaboration of Care: Other provider involved in patient's care AEB notes are available in epic to review.  Patient/Guardian was advised Release of Information must be obtained prior to any record release in order to collaborate their care with an outside provider. Patient/Guardian was advised if they  have not already done so to contact the registration department to sign all necessary forms in order for [place] to release information regarding their care.   Consent: Patient/Guardian gives verbal consent for treatment and assignment of benefits for services provided during this visit. Patient/Guardian expressed understanding and agreed to proceed.      I provided 26 minutes of non-face-to-face time during this encounter.   Kathlee Nations, MD

## 2022-05-20 ENCOUNTER — Ambulatory Visit (HOSPITAL_COMMUNITY): Payer: Medicare Other

## 2022-06-03 ENCOUNTER — Ambulatory Visit (HOSPITAL_COMMUNITY)
Admission: RE | Admit: 2022-06-03 | Discharge: 2022-06-03 | Disposition: A | Discharge status: Home / Self Care | Payer: Medicare Other | Source: Ambulatory Visit | Attending: Emergency Medicine | Admitting: Emergency Medicine

## 2022-06-03 DIAGNOSIS — R918 Other nonspecific abnormal finding of lung field: Secondary | ICD-10-CM | POA: Diagnosis not present

## 2022-06-03 DIAGNOSIS — J432 Centrilobular emphysema: Secondary | ICD-10-CM | POA: Diagnosis not present

## 2022-06-03 DIAGNOSIS — R9389 Abnormal findings on diagnostic imaging of other specified body structures: Secondary | ICD-10-CM | POA: Diagnosis not present

## 2022-07-02 ENCOUNTER — Encounter: Payer: Medicare Other | Admitting: Emergency Medicine

## 2022-07-02 NOTE — Progress Notes (Signed)
   Subjective:    Patient ID: Blake Mcknight, male    DOB: [birth year], 67 y.o.   MRN: [id number]  HPI  ROV 07/02/22 --follow-up visit for 67 year old man with severe emphysematous COPD and associated hypoxemic respiratory failure.  We have been following him for an abnormal CT scan of the chest that has shown waxing and waning subtle pulmonary nodular disease versus scar, subcarinal lymphadenopathy.  He is quite debilitated, has not been unsure how aggressive he wants to be with regard to diagnostics or treatment of possible malignancy.  We have been following serial imaging.  Today he reports Currently managed on Breztri, prednisone 20 mg daily.  Uses DuoNeb or albuterol approximately  CT scan of the chest performed 06/03/2022 reviewed by me shows scattered nodular infiltrates some with calcification that are all stable to improved compared with his prior CT 02/24/2022.  He has subcarinal lymphadenopathy which is slightly decreased in size compared with December.  No new findings.   Review of Systems As per HPI     Objective:   Physical Exam There were no vitals filed for this visit.  Gen: Debilitated man in a wheelchair, well-nourished, in no distress, depressed affect  ENT: No lesions,  mouth clear,  oropharynx clear, no postnasal drip  Neck: No JVD, no stridor  Lungs: No use of accessory muscles, very distant, no crackles or wheezing on normal respiration, no wheeze on forced expiration  Cardiovascular: RRR, heart sounds normal, no murmur or gallops, no peripheral edema  Musculoskeletal: No deformities, no cyanosis or clubbing  Neuro: Awake, alert, will answer some simple questions.  Mild dysarthria  Skin: Warm, no lesions or rash      Assessment & Plan:  No problem-specific Assessment & Plan notes found for this encounter.  Time spent 33 minutes  Levy Pupa, MD, PhD 07/02/2022, 2:48 PM [facility] [phone] or if no answer before 7:00PM  call [phone] For any issues after 7:00PM please call eLink [phone]

## 2022-07-04 ENCOUNTER — Encounter: Payer: Self-pay | Admitting: Emergency Medicine

## 2022-07-04 ENCOUNTER — Ambulatory Visit (INDEPENDENT_AMBULATORY_CARE_PROVIDER_SITE_OTHER): Payer: Medicare Other | Admitting: Emergency Medicine

## 2022-07-04 VITALS — BP 112/70 | HR 89 | Temp 97.9°F | Ht 66.0 in | Wt 155.0 lb

## 2022-07-04 DIAGNOSIS — R9389 Abnormal findings on diagnostic imaging of other specified body structures: Secondary | ICD-10-CM

## 2022-07-04 DIAGNOSIS — J9611 Chronic respiratory failure with hypoxia: Secondary | ICD-10-CM

## 2022-07-04 DIAGNOSIS — J449 Chronic obstructive pulmonary disease, unspecified: Secondary | ICD-10-CM | POA: Diagnosis not present

## 2022-07-04 NOTE — Progress Notes (Signed)
   Subjective:    Patient ID: Blake Mcknight, male    DOB: Feb 09, 1956, 67 y.o.   MRN: [id number]  HPI  ROV 07/04/22 --follow-up visit for 67 year old man with severe emphysematous COPD and associated hypoxemic respiratory failure.  We have been following him for an abnormal CT scan of the chest that has shown waxing and waning subtle pulmonary nodular disease versus scar, subcarinal lymphadenopathy.  He is quite debilitated, has not been unsure how aggressive he wants to be with regard to diagnostics or treatment of possible malignancy.  We have been following serial imaging.  His wife helps w hx. He has been doing ok. Quite debilitated. Coughs daily, some mucous production.  Currently managed on Breztri, prednisone 20 mg daily.  Uses DuoNeb or albuterol approximately  CT scan of the chest performed 06/03/2022 reviewed by me shows scattered nodular infiltrates some with calcification that are all stable to improved compared with his prior CT 02/24/2022.  He has subcarinal lymphadenopathy which is slightly decreased in size compared with December.  No new findings.   Review of Systems As per HPI     Objective:   Physical Exam Vitals:   07/04/22 1511  BP: 112/70  Pulse: 89  Temp: 97.9 F (36.6 C)  TempSrc: Oral  SpO2: 98%  Weight: 155 lb (70.3 kg)  Height:  (1.676 m)    Gen: Debilitated man in a wheelchair, well-nourished, in no distress, depressed affect  ENT: No lesions,  mouth clear,  oropharynx clear, no postnasal drip  Neck: No JVD, no stridor  Lungs: No use of accessory muscles, very distant, no crackles or wheezing on normal respiration, no wheeze on forced expiration  Cardiovascular: RRR, heart sounds normal, no murmur or gallops, no peripheral edema  Musculoskeletal: No deformities, no cyanosis or clubbing  Neuro: Awake, alert, will answer some simple questions.  Mild dysarthria  Skin: Warm, no lesions or rash      Assessment & Plan:  Abnormal CT of the  chest Following scattered nodular infiltrates, some calcified.  There has been improvement compared with 02/24/2022.  Stable subcarinal adenopathy.  No new infiltrates or nodules.  Plan to repeat his CT chest in 1 year, March 2025.  COPD (chronic obstructive pulmonary disease) (HCC) Please continue Breztri 2 puffs twice a day.  Try using this with a spacer.  We will provide one for you.  Rinse and gargle after using. Use your DuoNeb 1 nebulizer treatment when you needed for shortness of breath, chest tightness, wheezing. Continue prednisone 10 mg twice a day Follow with APP in 6 months Follow Dr. Delton Coombes in 1 year or sooner if you have any problems.  Chronic respiratory failure with hypoxia (HCC) Continue oxygen as you have been using it   Levy Pupa, MD, PhD 07/04/2022, 3:41 PM [facility] [phone] or if no answer before 7:00PM call [phone] For any issues after 7:00PM please call eLink [phone]

## 2022-07-04 NOTE — Assessment & Plan Note (Signed)
Following scattered nodular infiltrates, some calcified.  There has been improvement compared with 02/24/2022.  Stable subcarinal adenopathy.  No new infiltrates or nodules.  Plan to repeat his CT chest in 1 year, March 2025.

## 2022-07-04 NOTE — Assessment & Plan Note (Signed)
Continue oxygen as you have been using it 

## 2022-07-04 NOTE — Assessment & Plan Note (Signed)
Please continue Breztri 2 puffs twice a day.  Try using this with a spacer.  We will provide one for you.  Rinse and gargle after using. Use your DuoNeb 1 nebulizer treatment when you needed for shortness of breath, chest tightness, wheezing. Continue prednisone 10 mg twice a day Follow with APP in 6 months Follow Dr. Delton Coombes in 1 year or sooner if you have any problems.

## 2022-07-04 NOTE — Patient Instructions (Signed)
Please continue Breztri 2 puffs twice a day.  Try using this with a spacer.  We will provide one for you.  Rinse and gargle after using. Use your DuoNeb 1 nebulizer treatment when you needed for shortness of breath, chest tightness, wheezing. Continue prednisone 10 mg twice a day We reviewed your CT scan of the chest today. We will repeat your CT chest without contrast in March 2025 Continue oxygen as you have been using it Follow with APP in 6 months Follow Dr. Delton Coombes in 1 year or sooner if you have any problems.

## 2022-07-04 NOTE — Addendum Note (Signed)
Addended by: Dorisann Frames R on: 07/04/2022 03:57 PM   Modules accepted: Orders

## 2022-08-05 DIAGNOSIS — K2100 Gastro-esophageal reflux disease with esophagitis, without bleeding: Secondary | ICD-10-CM | POA: Diagnosis not present

## 2022-08-05 DIAGNOSIS — F319 Bipolar disorder, unspecified: Secondary | ICD-10-CM | POA: Diagnosis not present

## 2022-08-05 DIAGNOSIS — Z125 Encounter for screening for malignant neoplasm of prostate: Secondary | ICD-10-CM | POA: Diagnosis not present

## 2022-08-05 DIAGNOSIS — M1990 Unspecified osteoarthritis, unspecified site: Secondary | ICD-10-CM | POA: Diagnosis not present

## 2022-08-05 DIAGNOSIS — N401 Enlarged prostate with lower urinary tract symptoms: Secondary | ICD-10-CM | POA: Diagnosis not present

## 2022-08-05 DIAGNOSIS — E559 Vitamin D deficiency, unspecified: Secondary | ICD-10-CM | POA: Diagnosis not present

## 2022-08-05 DIAGNOSIS — M2550 Pain in unspecified joint: Secondary | ICD-10-CM | POA: Diagnosis not present

## 2022-08-05 DIAGNOSIS — R7302 Impaired glucose tolerance (oral): Secondary | ICD-10-CM | POA: Diagnosis not present

## 2022-08-05 DIAGNOSIS — I119 Hypertensive heart disease without heart failure: Secondary | ICD-10-CM | POA: Diagnosis not present

## 2022-08-05 DIAGNOSIS — E7800 Pure hypercholesterolemia, unspecified: Secondary | ICD-10-CM | POA: Diagnosis not present

## 2022-08-05 DIAGNOSIS — Z79899 Other long term (current) drug therapy: Secondary | ICD-10-CM | POA: Diagnosis not present

## 2022-08-05 DIAGNOSIS — J441 Chronic obstructive pulmonary disease with (acute) exacerbation: Secondary | ICD-10-CM | POA: Diagnosis not present

## 2022-09-07 ENCOUNTER — Emergency Department (HOSPITAL_COMMUNITY): Payer: Medicare Other

## 2022-09-07 ENCOUNTER — Encounter (HOSPITAL_COMMUNITY): Payer: Self-pay

## 2022-09-07 ENCOUNTER — Other Ambulatory Visit: Payer: Self-pay

## 2022-09-07 ENCOUNTER — Emergency Department (HOSPITAL_COMMUNITY)
Admission: EM | Admit: 2022-09-07 | Discharge: 2022-09-07 | Disposition: A | Discharge status: Home / Self Care | Payer: Medicare Other | Attending: Emergency Medicine | Admitting: Emergency Medicine

## 2022-09-07 DIAGNOSIS — R062 Wheezing: Secondary | ICD-10-CM | POA: Diagnosis not present

## 2022-09-07 DIAGNOSIS — J449 Chronic obstructive pulmonary disease, unspecified: Secondary | ICD-10-CM | POA: Diagnosis not present

## 2022-09-07 DIAGNOSIS — Z7952 Long term (current) use of systemic steroids: Secondary | ICD-10-CM | POA: Diagnosis not present

## 2022-09-07 DIAGNOSIS — J441 Chronic obstructive pulmonary disease with (acute) exacerbation: Secondary | ICD-10-CM | POA: Diagnosis not present

## 2022-09-07 DIAGNOSIS — R000 Tachycardia, unspecified: Secondary | ICD-10-CM | POA: Diagnosis not present

## 2022-09-07 DIAGNOSIS — I10 Essential (primary) hypertension: Secondary | ICD-10-CM | POA: Diagnosis not present

## 2022-09-07 DIAGNOSIS — R069 Unspecified abnormalities of breathing: Secondary | ICD-10-CM | POA: Diagnosis not present

## 2022-09-07 DIAGNOSIS — R0602 Shortness of breath: Secondary | ICD-10-CM | POA: Diagnosis present

## 2022-09-07 LAB — BASIC METABOLIC PANEL
Anion gap: 11 (ref 5–15)
BUN: <5 mg/dL — ABNORMAL LOW (ref 8–23)
CO2: 21 mmol/L — ABNORMAL LOW (ref 22–32)
Calcium: 9 mg/dL (ref 8.9–10.3)
Chloride: 101 mmol/L (ref 98–111)
Creatinine, Ser: 0.73 mg/dL (ref 0.61–1.24)
GFR, Estimated: >60 mL/min (ref >60)
Glucose, Bld: 116 mg/dL — ABNORMAL HIGH (ref 70–99)
Potassium: 3.8 mmol/L (ref 3.5–5.1)
Sodium: 133 mmol/L — ABNORMAL LOW (ref 135–145)

## 2022-09-07 LAB — CBC
HCT: 44.1 % (ref 39.0–52.0)
Hemoglobin: 14.5 g/dL (ref 13.0–17.0)
MCH: 28.5 pg (ref 26.0–34.0)
MCHC: 32.9 g/dL (ref 30.0–36.0)
MCV: 86.6 fL (ref 80.0–100.0)
Platelets: 304 10*3/uL (ref 150–400)
RBC: 5.09 MIL/uL (ref 4.22–5.81)
RDW: 13.6 % (ref 11.5–15.5)
WBC: 9.1 10*3/uL (ref 4.0–10.5)
nRBC: 0 % (ref 0.0–0.2)

## 2022-09-07 LAB — I-STAT VENOUS BLOOD GAS, ED
Acid-Base Excess: 1 mmol/L (ref 0.0–2.0)
Bicarbonate: 26.9 mmol/L (ref 20.0–28.0)
Calcium, Ion: 1.16 mmol/L (ref 1.15–1.40)
HCT: 46 % (ref 39.0–52.0)
Hemoglobin: 15.6 g/dL (ref 13.0–17.0)
O2 Saturation: 99 %
Potassium: 3.9 mmol/L (ref 3.5–5.1)
Sodium: 135 mmol/L (ref 135–145)
TCO2: 28 mmol/L (ref 22–32)
pCO2, Ven: 45 mmHg (ref 44–60)
pH, Ven: 7.386 (ref 7.25–7.43)
pO2, Ven: 152 mmHg — ABNORMAL HIGH (ref 32–45)

## 2022-09-07 MED ORDER — PREDNISONE 20 MG PO TABS: 40.0000 mg | ORAL_TABLET | Freq: Every day | ORAL | 0 refills | Status: AC

## 2022-09-07 MED ORDER — AZITHROMYCIN 250 MG PO TABS: 250.0000 mg | ORAL_TABLET | Freq: Every day | ORAL | 0 refills | Status: DC

## 2022-09-07 MED ORDER — IPRATROPIUM-ALBUTEROL 0.5-2.5 (3) MG/3ML IN SOLN
3.0000 mL | Freq: Once | RESPIRATORY_TRACT | Status: AC
  Administered 2022-09-07: 3 mL via RESPIRATORY_TRACT
  Filled 2022-09-07: qty 3

## 2022-09-07 NOTE — Progress Notes (Signed)
RT took pt off bipap per MD and placed pt on 2L [place], sats currently 100%. Pt is in no distress and bipap is not indicated a this time.

## 2022-09-07 NOTE — Progress Notes (Signed)
Pt BIB EMS on cpap for COPD exacerbation, RT placed pt on our bipap. Pt shows to have SOB, diminished BS and some labored breathing but seems to be tolerating bipap well at this time. RT will continue to monitor.

## 2022-09-07 NOTE — ED Provider Notes (Signed)
  Provider Note MRN:  [id number]  Arrival date & time: 09/07/22    ED Course and Medical Decision Making  Assumed care from Dr Rosalia Hammers at shift change.  See note from prior team for complete details, in brief:  67 yo male Hx COPD Here with dib/wheezing Resp distress on EMS arrival, NIPPV / solumedrol/ duonebs en route Improved on arrival Very minimal wheezing No home O2 CXR w/o pna  Plan per prior physician will trial off BIPAP and re-assess  Feeling better on recheck No on [facility] which is his home requirement No tachypnea or resp distress Pulse ox 99-100% continuous interpreted by myself  Sister will stay with him Do not need refills F/u pulm as outpatient   The patient improved significantly and was discharged in stable condition. Detailed discussions were had with the patient regarding current findings, and need for close f/u with PCP or on call doctor. The patient has been instructed to return immediately if the symptoms worsen in any way for re-evaluation. Patient verbalized understanding and is in agreement with current care plan. All questions answered prior to discharge.    .Critical Care  Performed by: Sloan Leiter, DO Authorized by: Sloan Leiter, DO   Critical care provider statement:    Critical care time (minutes):  30   Critical care time was exclusive of:  Separately billable procedures and treating other patients   Critical care was necessary to treat or prevent imminent or life-threatening deterioration of the following conditions:  Respiratory failure   Critical care was time spent personally by me on the following activities:  Development of treatment plan with patient or surrogate, discussions with consultants, evaluation of patient's response to treatment, examination of patient, ordering and review of laboratory studies, ordering and review of radiographic studies, ordering and performing treatments and interventions, pulse oximetry, re-evaluation of patient's  condition, review of old charts and obtaining history from patient or surrogate   Final Clinical Impressions(s) / ED Diagnoses     ICD-10-CM   1. COPD exacerbation (HCC)  J44.1       ED Discharge Orders          Ordered    predniSONE (DELTASONE) 20 MG tablet  Daily        09/07/22 1748    azithromycin (ZITHROMAX) 250 MG tablet  Daily        09/07/22 1748              Discharge Instructions      It was a pleasure caring for you today in the emergency department.  Please return to the emergency department for any worsening or worrisome symptoms.          Sloan Leiter, DO 09/07/22 1911

## 2022-09-07 NOTE — ED Triage Notes (Signed)
Pt bib ems from home c/o COPD exacerbation. Pt took two albuterol neb tx prior to EMS without relief. Pt was given a Duoneb in route, 125 mg Solumedrol, and 2 g Magnesium  BP 135/94 HR 107 CPAP 99% CBG 99

## 2022-09-07 NOTE — ED Notes (Signed)
Pt states he it is time for another breathing tx. RN will inform EDP

## 2022-09-07 NOTE — ED Notes (Signed)
Shift report received, assumed care of patient at this time.  

## 2022-09-07 NOTE — Discharge Instructions (Addendum)
It was a pleasure caring for you today in the emergency department. ° °Please return to the emergency department for any worsening or worrisome symptoms. ° ° °

## 2022-09-07 NOTE — ED Notes (Signed)
Provided pt urine

## 2022-09-07 NOTE — Progress Notes (Signed)
   09/07/22 1423  Vent Select  $ Ventilator Initial/Subsequent  Initial  Invasive or Noninvasive Noninvasive  Adult Vent Y  Adult Ventilator Settings  Vent Type Servo i  Vent Mode BIPAP;PCV  Set Rate 15 bmp  FiO2 (%) 40 %  I Time 0.9 Sec(s)  Pressure Control 10 cmH20  PEEP 5 cmH20  Adult Ventilator Measurements  Peak Airway Pressure 13 L/min  Mean Airway Pressure 7 cmH20  Resp Rate Spontaneous 4 br/min  Resp Rate Total 19 br/min  Spont TV 656 mL  Measured Ve 11.7 L  I:E Ratio Measured 1:2.4  Auto PEEP 0 cmH20  Total PEEP 5 cmH20  SpO2 100 %

## 2022-09-07 NOTE — ED Notes (Signed)
Pt family states she would like pt to stay over at least a night. She says he has coughing a lot more which is a sign usually he is getting worse.

## 2022-09-07 NOTE — ED Provider Notes (Signed)
[place] EMERGENCY DEPARTMENT AT [facility] Provider Note   CSN: [id number] Arrival date & time: 09/07/22  1411     History  Chief Complaint  Patient presents with   Shortness of Breath    JAVONNE DORKO is a 67 y.o. male.  HPI 67 year old male history of COPD presents today with increased wheezing and difficulty breathing.  Patient is on BiPAP.  History is related through EMS.  Patient reported to them that he does not take inhalers every day.  Today he began having some wheezing and uses albuterol twice without relief.  On EMS arrival patient was having respiratory distress and was placed on BiPAP.  Prehospital he received Solu-Medrol, 2 g magnesium, and DuoNeb.     Home Medications Prior to Admission medications   Medication Sig Start Date End Date Taking? Authorizing Provider  albuterol (VENTOLIN HFA) 108 (90 Base) MCG/ACT inhaler Inhale 2 puffs into the lungs every 4 (four) hours as needed for wheezing or shortness of breath. 02/20/19   Glade Lloyd, MD  amLODipine (NORVASC) 10 MG tablet Take 10 mg by mouth daily. 06/26/21   [provider]  atorvastatin (LIPITOR) 10 MG tablet Take 10 mg by mouth at bedtime. 09/21/15   [provider]  benzonatate (TESSALON) 200 MG capsule Take 1 capsule (200 mg total) by mouth 2 (two) times daily as needed for cough. 09/05/20   Narda Bonds, MD  benztropine (COGENTIN) 2 MG tablet Take 1 tablet (2 mg total) by mouth 2 (two) times daily. 04/24/22   Arfeen, Phillips Grout, MD  BREZTRI AEROSPHERE 160-9-4.8 MCG/ACT AERO Inhale 2 puffs into the lungs in the morning and at bedtime. 07/18/20   [provider]  carvedilol (COREG) 3.125 MG tablet Take 1 tablet (3.125 mg total) by mouth 2 (two) times daily with a meal. Patient taking differently: Take 1.5625-3.125 mg by mouth See admin instructions. Take one tablet by mouth daily in the morning and one-half tablet by mouth daily in the evening. 02/20/19   Glade Lloyd, MD   castor oil liquid Take 10 mLs by mouth daily as needed for moderate constipation or mild constipation.    [provider]  cetirizine (ZYRTEC) 10 MG tablet Take 10 mg by mouth daily as needed for allergies or rhinitis.    [provider]  cholecalciferol (VITAMIN D) 25 MCG (1000 UNIT) tablet Take 4,000 Units by mouth every evening.     [provider]  famotidine (PEPCID) 40 MG tablet Take 40 mg by mouth 2 (two) times daily. 12/28/18   [provider]  HYDROCODONE-CHLORPHENIRAMINE PO Take 5 mLs by mouth every 6 (six) hours as needed (cough and pain).    [provider]  ipratropium-albuterol (DUONEB) 0.5-2.5 (3) MG/3ML SOLN Take 3 mLs by nebulization every 4 (four) hours. 04/27/21   [provider]  MILK THISTLE PO Take 1 capsule by mouth daily.    [provider]  mirtazapine (REMERON) 30 MG tablet Take 1 tablet (30 mg total) by mouth at bedtime as needed (insomnia). 04/24/22 04/24/23  Arfeen, Phillips Grout, MD  multivitamin (ONE-A-DAY MEN'S) TABS tablet Take 1 tablet by mouth daily.    [provider]  OVER THE COUNTER MEDICATION Take 2-4 tablets by mouth daily as needed (constipation). Oxy powder    [provider]  OXYGEN Inhale 2 L/min into the lungs continuous.     [provider]  polyethylene glycol (MIRALAX / GLYCOLAX) 17 g packet Take 17 g by mouth  daily as needed for mild constipation.    [provider]  predniSONE (DELTASONE) 10 MG tablet Take 10 mg by mouth 2 (two) times daily. 11/14/21   [provider]  risperiDONE (RISPERDAL) 3 MG tablet Take 1 tablet (3 mg total) by mouth 2 (two) times daily. 04/24/22   Arfeen, Phillips Grout, MD  sodium chloride (OCEAN) 0.65 % SOLN nasal spray Place 1 spray into both nostrils as needed for congestion.    [provider]  tamsulosin (FLOMAX) 0.4 MG CAPS capsule Take 0.8 mg by mouth at bedtime. 07/23/20   [provider]      Allergies    Patient  has no known allergies.    Review of Systems   Review of Systems  Physical Exam Updated Vital Signs BP 126/87   Pulse 99   Temp 97.6 F (36.4 C) (Oral)   Resp 18   Ht 1.676 m (5\' 6" )   Wt 72.6 kg   SpO2 100%   BMI 25.82 kg/m  Physical Exam Vitals reviewed.  Constitutional:      General: He is not in acute distress. HENT:     Head: Normocephalic.     Mouth/Throat:     Mouth: Mucous membranes are moist.  Eyes:     Pupils: Pupils are equal, round, and reactive to light.  Cardiovascular:     Rate and Rhythm: Normal rate and regular rhythm.  Pulmonary:     Effort: Pulmonary effort is normal.     Breath sounds: Stridor present.     Comments: Some upper respiratory sounds with stridor radiating to right lobe otherwise difficult to hear lung sounds on the right Musculoskeletal:        General: Normal range of motion.     Cervical back: Normal range of motion.  Skin:    General: Skin is warm and dry.     Capillary Refill: Capillary refill takes less than 2 seconds.  Neurological:     General: No focal deficit present.     Mental Status: He is alert.  Psychiatric:        Mood and Affect: Mood normal.     ED Results / Procedures / Treatments   Labs (all labs ordered are listed, but only abnormal results are displayed) Labs Reviewed  BASIC METABOLIC PANEL - Abnormal; Notable for the following components:      Result Value   Sodium 133 (*)    CO2 21 (*)    Glucose, Bld 116 (*)    BUN <5 (*)    All other components within normal limits  CBC  BLOOD GAS, VENOUS    EKG EKG Interpretation  Date/Time:  Sunday September 07 2022 14:18:21 EDT Ventricular Rate:  101 PR Interval:  233 QRS Duration: 88 QT Interval:  335 QTC Calculation: 435 R Axis:   96 Text Interpretation: Sinus tachycardia Multiple ventricular premature complexes Aberrant conduction of SV complex(es) Prolonged PR interval Right axis deviation Probable anteroseptal infarct, old Nonspecific T abnormalities,  lateral leads significant baseline wander interferes with interpretation Confirmed by Margarita Grizzle [phone]) on 09/07/2022 2:21:41 PM  Radiology DG Chest Port 1 View  Result Date: 09/07/2022 CLINICAL DATA:  [id number] COPD (chronic obstructive pulmonary disease) (HCC) [address]9811 EXAM: PORTABLE CHEST 1 VIEW COMPARISON:  06/03/2022 FINDINGS: Normal heart size. Severe changes of COPD with bullous emphysema, most pronounced within the right upper lobe. No new airspace consolidation. No pleural effusion or pneumothorax. IMPRESSION: Severe COPD. No superimposed acute cardiopulmonary findings. Electronically Signed  By: Duanne Guess D.O.   On: 09/07/2022 14:57    Procedures Procedures    Medications Ordered in ED Medications - No data to display  ED Course/ Medical Decision Making/ A&P Clinical Course as of 09/07/22 1525  Sun Sep 07, 2022  1520 COPD noted no acute cardiopulmonary findings radiologist interpretation concurs [DR]    Clinical Course User Index [DR] Margarita Grizzle, MD                             Medical Decision Making Amount and/or Complexity of Data Reviewed Labs: ordered. Radiology: ordered.   67 year old male with known history of COPD presents today via EMS on BiPAP. Lung sounds on my exam more consistent with upper airway and patient does not appear to be in distress at this time. Plan VBG, Chest x-[facility] clear Labs pending Discussed with Dr. Wallace Cullens who will reevaluate       Final Clinical Impression(s) / ED Diagnoses Final diagnoses:  COPD exacerbation [facility])    Rx / DC Orders ED Discharge Orders     None         Margarita Grizzle, MD 09/07/22 1526

## 2022-09-12 ENCOUNTER — Telehealth: Payer: Self-pay

## 2022-09-12 NOTE — Telephone Encounter (Signed)
Transition Care Management Unsuccessful Follow-up Telephone Call  Date of discharge and from where:  [facility] 6[facility]  Attempts:  1st Attempt  Reason for unsuccessful TCM follow-up call:  No answer/busy   [facility] Guide, [facility] [phone] [address] [place], [place], [place] [address] Phone: [phone] Email: [email or web address]

## 2022-09-15 ENCOUNTER — Telehealth: Payer: Self-pay

## 2022-09-15 NOTE — Telephone Encounter (Signed)
Transition Care Management Unsuccessful Follow-up Telephone Call  Date of discharge and from where:  [facility]  Attempts:  2nd Attempt  Reason for unsuccessful TCM follow-up call:  Unable to leave message   [facility] Guide, [facility] [phone] [address] [place], [place], [place] [address] Phone: [phone] Email: [email or web address]

## 2022-10-02 ENCOUNTER — Emergency Department (HOSPITAL_COMMUNITY)
Admission: EM | Admit: 2022-10-02 | Discharge: 2022-10-02 | Disposition: A | Discharge status: Home / Self Care | Payer: Medicare Other | Attending: Emergency Medicine | Admitting: Emergency Medicine

## 2022-10-02 ENCOUNTER — Other Ambulatory Visit: Payer: Self-pay

## 2022-10-02 ENCOUNTER — Emergency Department (HOSPITAL_COMMUNITY): Payer: Medicare Other

## 2022-10-02 ENCOUNTER — Encounter (HOSPITAL_COMMUNITY): Payer: Self-pay

## 2022-10-02 DIAGNOSIS — R069 Unspecified abnormalities of breathing: Secondary | ICD-10-CM | POA: Diagnosis not present

## 2022-10-02 DIAGNOSIS — I10 Essential (primary) hypertension: Secondary | ICD-10-CM | POA: Diagnosis not present

## 2022-10-02 DIAGNOSIS — J9620 Acute and chronic respiratory failure, unspecified whether with hypoxia or hypercapnia: Secondary | ICD-10-CM

## 2022-10-02 DIAGNOSIS — J441 Chronic obstructive pulmonary disease with (acute) exacerbation: Secondary | ICD-10-CM | POA: Diagnosis not present

## 2022-10-02 DIAGNOSIS — R062 Wheezing: Secondary | ICD-10-CM | POA: Diagnosis present

## 2022-10-02 DIAGNOSIS — R0603 Acute respiratory distress: Secondary | ICD-10-CM | POA: Diagnosis not present

## 2022-10-02 DIAGNOSIS — J439 Emphysema, unspecified: Secondary | ICD-10-CM | POA: Diagnosis not present

## 2022-10-02 DIAGNOSIS — R0602 Shortness of breath: Secondary | ICD-10-CM | POA: Diagnosis not present

## 2022-10-02 LAB — BASIC METABOLIC PANEL
Anion gap: 9 (ref 5–15)
BUN: 10 mg/dL (ref 8–23)
CO2: 25 mmol/L (ref 22–32)
Calcium: 8.5 mg/dL — ABNORMAL LOW (ref 8.9–10.3)
Chloride: 99 mmol/L (ref 98–111)
Creatinine, Ser: 0.74 mg/dL (ref 0.61–1.24)
GFR, Estimated: >60 mL/min (ref >60)
Glucose, Bld: 112 mg/dL — ABNORMAL HIGH (ref 70–99)
Potassium: 3.8 mmol/L (ref 3.5–5.1)
Sodium: 133 mmol/L — ABNORMAL LOW (ref 135–145)

## 2022-10-02 LAB — CBC
HCT: 41.4 % (ref 39.0–52.0)
Hemoglobin: 13.6 g/dL (ref 13.0–17.0)
MCH: 28 pg (ref 26.0–34.0)
MCHC: 32.9 g/dL (ref 30.0–36.0)
MCV: 85.4 fL (ref 80.0–100.0)
Platelets: 341 10*3/uL (ref 150–400)
RBC: 4.85 MIL/uL (ref 4.22–5.81)
RDW: 14.1 % (ref 11.5–15.5)
WBC: 12.1 10*3/uL — ABNORMAL HIGH (ref 4.0–10.5)
nRBC: 0 % (ref 0.0–0.2)

## 2022-10-02 MED ORDER — IPRATROPIUM BROMIDE 0.02 % IN SOLN
0.5000 mg | Freq: Once | RESPIRATORY_TRACT | Status: AC
  Administered 2022-10-02: 0.5 mg via RESPIRATORY_TRACT
  Filled 2022-10-02: qty 2.5

## 2022-10-02 MED ORDER — ALBUTEROL SULFATE HFA 108 (90 BASE) MCG/ACT IN AERS: 2.0000 | INHALATION_SPRAY | RESPIRATORY_TRACT | 1 refills | Status: AC | PRN

## 2022-10-02 MED ORDER — ALBUTEROL SULFATE HFA 108 (90 BASE) MCG/ACT IN AERS
2.0000 | INHALATION_SPRAY | RESPIRATORY_TRACT | Status: DC | PRN
  Administered 2022-10-02: 2 via RESPIRATORY_TRACT
  Filled 2022-10-02: qty 6.7

## 2022-10-02 MED ORDER — ALBUTEROL SULFATE (2.5 MG/3ML) 0.083% IN NEBU
5.0000 mg | INHALATION_SOLUTION | Freq: Once | RESPIRATORY_TRACT | Status: AC
  Administered 2022-10-02: 5 mg via RESPIRATORY_TRACT
  Filled 2022-10-02: qty 6

## 2022-10-02 MED ORDER — PREDNISONE 20 MG PO TABS: 60.0000 mg | ORAL_TABLET | Freq: Every day | ORAL | 0 refills | Status: DC

## 2022-10-02 NOTE — ED Provider Notes (Signed)
[place] EMERGENCY DEPARTMENT AT [facility] Provider Note   CSN: [id number] Arrival date & time: 10/02/22  1126     History  Chief Complaint  Patient presents with  . Shortness of Breath    Blake Mcknight is a 67 y.o. male.  Patient with hx copd, with increased wheezing, non prod cough. Ems gave continuous albuterol neb, mg, solumedrol. Pt notes same symptoms in past related to his copd. Denies sore throat, runny nose or other uri symptoms. No chest pain or discomfort. No abd pain or nvd. No extremity pain/swelling. No fever or chills. Former smoker.   The history is provided by the patient, medical records and the EMS personnel.  Shortness of Breath Associated symptoms: cough and wheezing   Associated symptoms: no abdominal pain, no chest pain, no fever, no headaches, no neck pain, no rash, no sore throat and no vomiting        Home Medications Prior to Admission medications   Medication Sig Start Date End Date Taking? Authorizing Provider  albuterol (VENTOLIN HFA) 108 (90 Base) MCG/ACT inhaler Inhale 2 puffs into the lungs every 4 (four) hours as needed for wheezing or shortness of breath. 02/20/19   Glade Lloyd, MD  amLODipine (NORVASC) 10 MG tablet Take 10 mg by mouth daily. 06/26/21   [provider]  atorvastatin (LIPITOR) 10 MG tablet Take 10 mg by mouth at bedtime. 09/21/15   [provider]  azithromycin (ZITHROMAX) 250 MG tablet Take 1 tablet (250 mg total) by mouth daily. Take first 2 tablets together, then 1 every day until finished. 09/07/22   Sloan Leiter, DO  benzonatate (TESSALON) 200 MG capsule Take 1 capsule (200 mg total) by mouth 2 (two) times daily as needed for cough. 09/05/20   Narda Bonds, MD  benztropine (COGENTIN) 2 MG tablet Take 1 tablet (2 mg total) by mouth 2 (two) times daily. 04/24/22   Arfeen, Phillips Grout, MD  BREZTRI AEROSPHERE 160-9-4.8 MCG/ACT AERO Inhale 2 puffs into the lungs in the morning and at bedtime.  07/18/20   [provider]  carvedilol (COREG) 3.125 MG tablet Take 1 tablet (3.125 mg total) by mouth 2 (two) times daily with a meal. Patient taking differently: Take 1.5625-3.125 mg by mouth See admin instructions. Take one tablet by mouth daily in the morning and one-half tablet by mouth daily in the evening. 02/20/19   Glade Lloyd, MD  castor oil liquid Take 10 mLs by mouth daily as needed for moderate constipation or mild constipation.    [provider]  cetirizine (ZYRTEC) 10 MG tablet Take 10 mg by mouth daily as needed for allergies or rhinitis.    [provider]  cholecalciferol (VITAMIN D) 25 MCG (1000 UNIT) tablet Take 4,000 Units by mouth every evening.     [provider]  famotidine (PEPCID) 40 MG tablet Take 40 mg by mouth 2 (two) times daily. 12/28/18   [provider]  HYDROCODONE-CHLORPHENIRAMINE PO Take 5 mLs by mouth every 6 (six) hours as needed (cough and pain).    [provider]  ipratropium-albuterol (DUONEB) 0.5-2.5 (3) MG/3ML SOLN Take 3 mLs by nebulization every 4 (four) hours. 04/27/21   [provider]  MILK THISTLE PO Take 1 capsule by mouth daily.    [provider]  mirtazapine (REMERON) 30 MG tablet Take 1 tablet (30 mg total) by mouth at bedtime as needed (insomnia). 04/24/22 04/24/23  Arfeen, Phillips Grout, MD  multivitamin (ONE-A-DAY MEN'S) TABS  tablet Take 1 tablet by mouth daily.    [provider]  OVER THE COUNTER MEDICATION Take 2-4 tablets by mouth daily as needed (constipation). Oxy powder    [provider]  OXYGEN Inhale 2 L/min into the lungs continuous.     [provider]  polyethylene glycol (MIRALAX / GLYCOLAX) 17 g packet Take 17 g by mouth daily as needed for mild constipation.    [provider]  risperiDONE (RISPERDAL) 3 MG tablet Take 1 tablet (3 mg total) by mouth 2 (two) times daily. 04/24/22   Arfeen, Phillips Grout, MD  sodium chloride (OCEAN) 0.65 %  SOLN nasal spray Place 1 spray into both nostrils as needed for congestion.    [provider]  tamsulosin (FLOMAX) 0.4 MG CAPS capsule Take 0.8 mg by mouth at bedtime. 07/23/20   [provider]      Allergies    Patient has no known allergies.    Review of Systems   Review of Systems  Constitutional:  Negative for chills and fever.  HENT:  Negative for sore throat.   Eyes:  Negative for redness.  Respiratory:  Positive for cough, shortness of breath and wheezing.   Cardiovascular:  Negative for chest pain, palpitations and leg swelling.  Gastrointestinal:  Negative for abdominal pain and vomiting.  Genitourinary:  Negative for flank pain.  Musculoskeletal:  Negative for back pain and neck pain.  Skin:  Negative for rash.  Neurological:  Negative for headaches.    Physical Exam Updated Vital Signs BP 122/86   Pulse 93   Temp 97.7 F (36.5 C) (Oral)   Resp (!) 22   Ht 1.676 m (5\' 6" )   Wt 72 kg   SpO2 100%   BMI 25.62 kg/m  Physical Exam Vitals and nursing note reviewed.  Constitutional:      Appearance: He is well-developed.  HENT:     Head: Atraumatic.     Nose: Nose normal.     Mouth/Throat:     Mouth: Mucous membranes are moist.     Pharynx: Oropharynx is clear.  Eyes:     General: No scleral icterus.    Conjunctiva/sclera: Conjunctivae normal.  Neck:     Trachea: No tracheal deviation.  Cardiovascular:     Rate and Rhythm: Normal rate and regular rhythm.     Pulses: Normal pulses.     Heart sounds: Normal heart sounds. No murmur heard.    No friction rub. No gallop.  Pulmonary:     Effort: Pulmonary effort is normal. No accessory muscle usage or respiratory distress.     Breath sounds: Wheezing present.  Abdominal:     General: There is no distension.     Palpations: Abdomen is soft.     Tenderness: There is no abdominal tenderness.  Musculoskeletal:        General: No swelling or tenderness.     Cervical back: Normal range of motion  and neck supple. No rigidity.     Right lower leg: No edema.     Left lower leg: No edema.  Skin:    General: Skin is warm and dry.     Findings: No rash.  Neurological:     Mental Status: He is alert.     Comments: Alert, speech clear.   Psychiatric:        Mood and Affect: Mood normal.    ED Results / Procedures / Treatments   Labs (all labs ordered are listed, but  only abnormal results are displayed) Results for orders placed or performed during the hospital encounter of 10/02/22  CBC  Result Value Ref Range   WBC 12.1 (H) 4.0 - 10.5 K/uL   RBC 4.85 4.22 - 5.81 MIL/uL   Hemoglobin 13.6 13.0 - 17.0 g/dL   HCT 40.9 81.1 - 91.4 %   MCV 85.4 80.0 - 100.0 fL   MCH 28.0 26.0 - 34.0 pg   MCHC 32.9 30.0 - 36.0 g/dL   RDW 78.2 95.6 - 21.3 %   Platelets 341 150 - 400 K/uL   nRBC 0.0 0.0 - 0.2 %  Basic metabolic panel  Result Value Ref Range   Sodium 133 (L) 135 - 145 mmol/L   Potassium 3.8 3.5 - 5.1 mmol/L   Chloride 99 98 - 111 mmol/L   CO2 25 22 - 32 mmol/L   Glucose, Bld 112 (H) 70 - 99 mg/dL   BUN 10 8 - 23 mg/dL   Creatinine, Ser 0.86 0.61 - 1.24 mg/dL   Calcium 8.5 (L) 8.9 - 10.3 mg/dL   GFR, Estimated >57 >84 mL/min   Anion gap 9 5 - 15   DG Chest Port 1 View  Result Date: 10/02/2022 CLINICAL DATA:  Shortness of breath. EXAM: PORTABLE CHEST 1 VIEW COMPARISON:  September 07, 2022. FINDINGS: The heart size and mediastinal contours are within normal limits. Emphysematous disease is again noted throughout both lungs. No acute pulmonary abnormality is noted. The visualized skeletal structures are unremarkable. IMPRESSION: Severe emphysematous disease is again noted. No acute abnormality seen. Emphysema (ICD10-J43.9). Electronically Signed   By: Lupita Raider M.D.   On: 10/02/2022 12:08   DG Chest Port 1 View  Result Date: 09/07/2022 CLINICAL DATA:  [id number] COPD (chronic obstructive pulmonary disease) (HCC) [phone] EXAM: PORTABLE CHEST 1 VIEW COMPARISON:  06/03/2022 FINDINGS:  Normal heart size. Severe changes of COPD with bullous emphysema, most pronounced within the right upper lobe. No new airspace consolidation. No pleural effusion or pneumothorax. IMPRESSION: Severe COPD. No superimposed acute cardiopulmonary findings. Electronically Signed   By: Duanne Guess D.O.   On: 09/07/2022 14:57     EKG EKG Interpretation Date/Time:  Thursday October 02 2022 11:35:35 EDT Ventricular Rate:  96 PR Interval:  137 QRS Duration:  86 QT Interval:  324 QTC Calculation: 410 R Axis:   90  Text Interpretation: Sinus rhythm Baseline wander Confirmed by Cathren Laine ([id number]) on 10/02/2022 11:48:53 AM  Radiology DG Chest Port 1 View  Result Date: 10/02/2022 CLINICAL DATA:  Shortness of breath. EXAM: PORTABLE CHEST 1 VIEW COMPARISON:  September 07, 2022. FINDINGS: The heart size and mediastinal contours are within normal limits. Emphysematous disease is again noted throughout both lungs. No acute pulmonary abnormality is noted. The visualized skeletal structures are unremarkable. IMPRESSION: Severe emphysematous disease is again noted. No acute abnormality seen. Emphysema (ICD10-J43.9). Electronically Signed   By: Lupita Raider M.D.   On: 10/02/2022 12:08    Procedures Procedures    Medications Ordered in ED Medications  albuterol (VENTOLIN HFA) 108 (90 Base) MCG/ACT inhaler 2 puff (2 puffs Inhalation Given 10/02/22 1240)  albuterol (PROVENTIL) (2.5 MG/3ML) 0.083% nebulizer solution 5 mg (5 mg Nebulization Given 10/02/22 1240)  ipratropium (ATROVENT) nebulizer solution 0.5 mg (0.5 mg Nebulization Given 10/02/22 1240)  albuterol (PROVENTIL) (2.5 MG/3ML) 0.083% nebulizer solution 5 mg (5 mg Nebulization Given 10/02/22 1417)  ipratropium (ATROVENT) nebulizer solution 0.5 mg (0.5 mg Nebulization Given 10/02/22 1417)    ED Course/ Medical Decision  Making/ A&P                             Medical Decision Making Problems Addressed: Acute and chr resp failure, unsp w hypoxia or  hypercapnia (HCC): acute illness or injury    Details: Acute/chronic COPD exacerbation (HCC): acute illness or injury with systemic symptoms that poses a threat to life or bodily functions Wheezing: acute illness or injury with systemic symptoms that poses a threat to life or bodily functions  Amount and/or Complexity of Data Reviewed Independent Historian: EMS    Details: hx External Data Reviewed: notes. Labs: ordered. Decision-making details documented in ED Course. Radiology: ordered and independent interpretation performed. Decision-making details documented in ED Course. ECG/medicine tests: ordered and independent interpretation performed. Decision-making details documented in ED Course.  Risk Prescription drug management. Decision regarding hospitalization.   Iv ns. Continuous pulse ox and cardiac monitoring. Labs ordered/sent. Imaging ordered.   Differential diagnosis includes copd exacerbation, pna, etc. Dispo decision including potential need for admission considered - will get labs and imaging and reassess.   Reviewed nursing notes and prior charts for additional history. External reports reviewed. Additional history from: EMS.   Pt given continuous albuterol neb, solumedrol, Mg.  Persistent wheezing. Albuterol and atrovent neb.   Cardiac monitor: sinus rhythm, rate 94.  Labs reviewed/interpreted by me - hgb normal. K normal. Cr normal.   Xrays reviewed/interpreted by me - no pna.   Recheck, improved air movement. Mild wheezing. Additional albuterol/atrovent neb.   Recheck, pt, breathing comfortably, speaking in full sentences, pulse ox 99%, no increased wob, improved air movement, no wheezing. Pt denies any pain. Feels better.   Pt currently appears stable for d/c.  Rx for home.  Return precautions provided.             Final Clinical Impression(s) / ED Diagnoses Final diagnoses:  COPD exacerbation (HCC)  Acute and chr resp failure, unsp w hypoxia  or hypercapnia (HCC)  Wheezing    Rx / DC Orders ED Discharge Orders     None         Cathren Laine, MD 10/02/22 1459

## 2022-10-02 NOTE — ED Notes (Signed)
Got patient into a gown on the monitor did EKG shown to Dr Luvenia Heller patient is resting with nurse at bedside

## 2022-10-02 NOTE — Discharge Instructions (Addendum)
It was our pleasure to provide your ER care today - we hope that you feel better.  Take prednisone as prescribed. Use albuterol treatment every 4 hours as need.   Follow up closely with your doctor/pulmonary specialist in the coming week.  Return to ER right away  if worse, new symptoms, fevers, increased trouble breathing, chest pain, or other concern.

## 2022-10-02 NOTE — ED Triage Notes (Signed)
Pt bib [facility] from home where he was having difficulty breathing and was trying nebs at home with no relief. Pt received 10mg  albuterol, 1mg  atrovent, 125mg  solmedrol, 2g mag and still has diminished breath sounds on arrival.

## 2022-10-03 ENCOUNTER — Other Ambulatory Visit (HOSPITAL_COMMUNITY): Payer: Self-pay | Admitting: *Deleted

## 2022-10-03 DIAGNOSIS — F200 Paranoid schizophrenia: Secondary | ICD-10-CM

## 2022-10-03 DIAGNOSIS — F5105 Insomnia due to other mental disorder: Secondary | ICD-10-CM

## 2022-10-03 MED ORDER — MIRTAZAPINE 30 MG PO TABS
30.0000 mg | ORAL_TABLET | Freq: Every evening | ORAL | 0 refills | Status: DC | PRN
Start: 2022-10-03 — End: 2022-10-23

## 2022-10-09 ENCOUNTER — Emergency Department (HOSPITAL_COMMUNITY): Payer: Medicare Other

## 2022-10-09 ENCOUNTER — Emergency Department (HOSPITAL_COMMUNITY)
Admission: EM | Admit: 2022-10-09 | Discharge: 2022-10-09 | Disposition: A | Discharge status: Home / Self Care | Payer: Medicare Other | Attending: Emergency Medicine | Admitting: Emergency Medicine

## 2022-10-09 ENCOUNTER — Other Ambulatory Visit: Payer: Self-pay

## 2022-10-09 ENCOUNTER — Telehealth: Payer: Self-pay

## 2022-10-09 DIAGNOSIS — R0602 Shortness of breath: Secondary | ICD-10-CM

## 2022-10-09 DIAGNOSIS — K439 Ventral hernia without obstruction or gangrene: Secondary | ICD-10-CM | POA: Diagnosis not present

## 2022-10-09 DIAGNOSIS — R069 Unspecified abnormalities of breathing: Secondary | ICD-10-CM | POA: Diagnosis not present

## 2022-10-09 DIAGNOSIS — R109 Unspecified abdominal pain: Secondary | ICD-10-CM

## 2022-10-09 DIAGNOSIS — R9389 Abnormal findings on diagnostic imaging of other specified body structures: Secondary | ICD-10-CM

## 2022-10-09 DIAGNOSIS — F17210 Nicotine dependence, cigarettes, uncomplicated: Secondary | ICD-10-CM | POA: Insufficient documentation

## 2022-10-09 DIAGNOSIS — R590 Localized enlarged lymph nodes: Secondary | ICD-10-CM | POA: Diagnosis not present

## 2022-10-09 DIAGNOSIS — R062 Wheezing: Secondary | ICD-10-CM | POA: Diagnosis not present

## 2022-10-09 DIAGNOSIS — R918 Other nonspecific abnormal finding of lung field: Secondary | ICD-10-CM | POA: Diagnosis not present

## 2022-10-09 DIAGNOSIS — I10 Essential (primary) hypertension: Secondary | ICD-10-CM | POA: Diagnosis not present

## 2022-10-09 DIAGNOSIS — J449 Chronic obstructive pulmonary disease, unspecified: Secondary | ICD-10-CM | POA: Diagnosis not present

## 2022-10-09 DIAGNOSIS — J439 Emphysema, unspecified: Secondary | ICD-10-CM | POA: Diagnosis not present

## 2022-10-09 DIAGNOSIS — R1084 Generalized abdominal pain: Secondary | ICD-10-CM | POA: Diagnosis not present

## 2022-10-09 LAB — CBC WITH DIFFERENTIAL/PLATELET
Abs Immature Granulocytes: 0.24 10*3/uL — ABNORMAL HIGH (ref 0.00–0.07)
Basophils Absolute: 0.1 10*3/uL (ref 0.0–0.1)
Basophils Relative: 0 %
Eosinophils Absolute: 0 10*3/uL (ref 0.0–0.5)
Eosinophils Relative: 0 %
HCT: 42.8 % (ref 39.0–52.0)
Hemoglobin: 14.1 g/dL (ref 13.0–17.0)
Immature Granulocytes: 1 %
Lymphocytes Relative: 6 %
Lymphs Abs: 1.1 10*3/uL (ref 0.7–4.0)
MCH: 27.8 pg (ref 26.0–34.0)
MCHC: 32.9 g/dL (ref 30.0–36.0)
MCV: 84.3 fL (ref 80.0–100.0)
Monocytes Absolute: 0.3 10*3/uL (ref 0.1–1.0)
Monocytes Relative: 2 %
Neutro Abs: 17.2 10*3/uL — ABNORMAL HIGH (ref 1.7–7.7)
Neutrophils Relative %: 91 %
Platelets: 293 10*3/uL (ref 150–400)
RBC: 5.08 MIL/uL (ref 4.22–5.81)
RDW: 14.1 % (ref 11.5–15.5)
WBC: 18.9 10*3/uL — ABNORMAL HIGH (ref 4.0–10.5)
nRBC: 0 % (ref 0.0–0.2)

## 2022-10-09 LAB — BASIC METABOLIC PANEL
Anion gap: 14 (ref 5–15)
BUN: 18 mg/dL (ref 8–23)
CO2: 21 mmol/L — ABNORMAL LOW (ref 22–32)
Calcium: 8.9 mg/dL (ref 8.9–10.3)
Chloride: 96 mmol/L — ABNORMAL LOW (ref 98–111)
Creatinine, Ser: 0.84 mg/dL (ref 0.61–1.24)
GFR, Estimated: >60 mL/min (ref >60)
Glucose, Bld: 174 mg/dL — ABNORMAL HIGH (ref 70–99)
Potassium: 4.3 mmol/L (ref 3.5–5.1)
Sodium: 131 mmol/L — ABNORMAL LOW (ref 135–145)

## 2022-10-09 MED ORDER — ALBUTEROL SULFATE (2.5 MG/3ML) 0.083% IN NEBU: 2.5000 mg/h | INHALATION_SOLUTION | Freq: Once | RESPIRATORY_TRACT | Status: DC

## 2022-10-09 MED ORDER — PREDNISONE 20 MG PO TABS: 20.0000 mg | ORAL_TABLET | Freq: Every day | ORAL | 0 refills | Status: AC

## 2022-10-09 NOTE — ED Provider Notes (Signed)
[place] EMERGENCY DEPARTMENT AT [facility] Provider Note  MDM   HPI/ROS:  Blake Mcknight is a 67 y.o. male with a medical history as below significant for COPD, HTN, HLD, schizophrenia who presents with complaint of shortness of breath, abdominal pain.  He reports he was walking from his sister's house earlier today when he is developed some abdominal pain and felt as though he was having shortness of breath.  Called EMS to transport him here.  He got DuoNeb and Solu-Medrol with EMS.  At this time he is asymptomatic.  Denies any recent infectious type symptoms.  Sister reports that he actually is doing better than he had been and has intermittent abdominal pain.  She is not having any sputum that is concerning for pneumonia.  Chart review shows that he had a recent visit to the emergency department approximately 7 days ago for similar complaint was found to be in a COPD exacerbation.  Physical exam is concerning for respiratory respiratory wheezing in all fields with some reduced lung sounds on the right.  Will proceed with checks x-ray, DuoNeb, VBG and reassess about an hour.  Patient overall well-appearing.  Does not appear septic.  Low suspicion for any other cause of his shortness of breath.  Interpretations, interventions, and the patient's course of care are documented below.    Clinical Course as of 10/09/22 2246  Thu Oct 09, 2022  1800 CT Chest Wo Contrast Concerning for a lung mass that may represent primary malignancy.  I discussed this at the bedside with the patient and his sister.  He has already established with pulmonology.  Will also give an ambulatory referral to oncology. [BB]    Clinical Course User Index [BB] Fayrene Helper, MD     Disposition:  I discussed the plan for discharge with the patient and/or their surrogate at bedside prior to discharge and they were in agreement with the plan and verbalized understanding of the return precautions provided.  All questions answered to the best of my ability. Ultimately, the patient was discharged in stable condition with stable vital signs. I am reassured that they are capable of close follow up and good social support at home.   Clinical Impression:  1. Shortness of breath   2. Right lower lobe lung mass   3. Abdominal pain, unspecified abdominal location   4. Abnormal CT of the chest     Rx / DC Orders ED Discharge Orders          Ordered    predniSONE (DELTASONE) 20 MG tablet  Daily        10/09/22 1611    Ambulatory referral to Hematology / Oncology        10/09/22 1904            The plan for this patient was discussed with Dr. Anitra Lauth, who voiced agreement and who oversaw evaluation and treatment of this patient.   Clinical Complexity A medically appropriate history, review of systems, and physical exam was performed.  My independent interpretations of EKG, labs, and radiology are documented in the ED course above.   If decision rules were used in this patient's evaluation, they are listed below.   Click here for ABCD2, HEART and other calculatorsREFRESH Note before signing   Patient's presentation is most consistent with acute presentation with potential threat to life or bodily function.  Medical Decision Making Amount and/or Complexity of Data Reviewed Independent Historian: caregiver External Data Reviewed: notes. Labs: ordered. Decision-making details  documented in ED Course. Radiology: ordered and independent interpretation performed. Decision-making details documented in ED Course.  Risk Prescription drug management.    HPI/ROS      See MDM section for pertinent HPI and ROS. A complete ROS was performed with pertinent positives/negatives noted above.   Past Medical History:  Diagnosis Date   COPD (chronic obstructive pulmonary disease) (HCC)    with bullous emphysema.    Heavy cigarette smoker before 2003   pt claims only 10 cigs per day, never  heavier amounts.    HLD (hyperlipidemia)    Hypertension    Schizophrenia (HCC) 06/28/2013   This is a chronic condition and he lives with family    Past Surgical History:  Procedure Laterality Date   CHOLECYSTECTOMY N/A 06/06/2013   Procedure: LAPAROSCOPIC CHOLECYSTECTOMY WITH ATTEMPTED INTRAOPERATIVE CHOLANGIOGRAM;  Surgeon: Liz Malady, MD;  Location: [facility] OR;  Service: General;  Laterality: N/A;   ERCP N/A 06/03/2013   Procedure: ENDOSCOPIC RETROGRADE CHOLANGIOPANCREATOGRAPHY (ERCP);  Surgeon: Meryl Dare, MD;  Location: [facility] ENDOSCOPY;  Service: Endoscopy;  Laterality: N/A;      Physical Exam   Vitals:   10/09/22 1426 10/09/22 1853  BP: 115/65 122/70  Pulse: 99 78  Resp: 19 18  Temp: 98.4 F (36.9 C) 98.7 F (37.1 C)  TempSrc: Oral Oral  SpO2: 90% 98%    Physical Exam Vitals and nursing note reviewed.  Constitutional:      General: He is not in acute distress.    Appearance: He is well-developed.  HENT:     Head: Normocephalic and atraumatic.  Eyes:     Extraocular Movements: Extraocular movements intact.     Conjunctiva/sclera: Conjunctivae normal.     Pupils: Pupils are equal, round, and reactive to light.  Cardiovascular:     Rate and Rhythm: Normal rate and regular rhythm.     Heart sounds: No murmur heard. Pulmonary:     Effort: Pulmonary effort is normal. Prolonged expiration present. No respiratory distress.     Breath sounds: No stridor. Examination of the right-upper field reveals decreased breath sounds and wheezing. Examination of the left-upper field reveals wheezing. Examination of the right-middle field reveals wheezing. Examination of the right-lower field reveals wheezing. Examination of the left-lower field reveals wheezing. Decreased breath sounds and wheezing present. No rhonchi or rales.  Abdominal:     General: Bowel sounds are normal.     Palpations: Abdomen is soft.     Tenderness: There is no abdominal tenderness. There is no guarding.      Hernia: A hernia is present. Hernia is present in the ventral area.  Musculoskeletal:        General: No swelling.     Cervical back: Neck supple.     Right lower leg: No edema.     Left lower leg: No edema.  Skin:    General: Skin is warm and dry.     Capillary Refill: Capillary refill takes less than 2 seconds.  Neurological:     Mental Status: He is alert.  Psychiatric:        Mood and Affect: Mood normal.      Procedures   If procedures were preformed on this patient, they are listed below:  Procedures   Fayrene Helper, MD Emergency Medicine PGY-2   Please note that this documentation was produced with the assistance of voice-to-text technology and may contain errors.    Fayrene Helper, MD 10/09/22 1610    Gwyneth Sprout, MD 10/09/22  2343  

## 2022-10-09 NOTE — Telephone Encounter (Signed)
Transition Care Management Follow-up Telephone Call Date of discharge and from where: 10/02/2022 [facility] How have you been since you were released from the hospital? Patient is feeling much better. Any questions or concerns? No  Items Reviewed: Did the pt receive and understand the discharge instructions provided? Yes  Medications obtained and verified? Yes  Other? No  Any new allergies since your discharge? No  Dietary orders reviewed? Yes Do you have support at home? Yes   Follow up appointments reviewed:  PCP Hospital f/u appt confirmed? No  Scheduled to see  on  @ . Specialist Hospital f/u appt confirmed? No  Scheduled to see  on  @ . Are transportation arrangements needed? No  If their condition worsens, is the pt aware to call PCP or go to the Emergency Dept.? Yes Was the patient provided with contact information for the PCP's office or ED? Yes Was to pt encouraged to call back with questions or concerns? Yes  Blake Mcknight [facility]  [facility] Population Health Community Resource Care Guide   ??[email or web address]  ?? [phone]   Website: [email or web address]  [email or web address]

## 2022-10-09 NOTE — Discharge Instructions (Addendum)
You were seen today for shortness of breath. While you were here we monitored your vitals, preformed a physical exam, and got a CT of your chest. There is no indication for any further testing or intervention in the emergency department at this time. Your Chest CT was concerning for lung cancer in your right lower lobe. The radiologist wrote that there was a "Right lower lobe mass measuring up to 5.7 cm, previously measured 2.3 cm."   Things to do:  - Follow up with your primary care provider within the next 1-2 weeks - Make an appointment with Dr. Delton Coombes to evaluate the lung mass  Return to the emergency department if you have any new or worsening symptoms including shortness of breath, coughing up green phlegm or blood, or if you have any other concerns.

## 2022-10-09 NOTE — ED Notes (Signed)
Patient Denies N/V/D

## 2022-10-09 NOTE — ED Triage Notes (Signed)
Patient BIB [facility] from side of the road. Patient walking home from sister house started having SOB and ABD pain. Wheezing noted. 125mg  solmedrol, IVF, Douneb x1.  due to soft blood pressure 99/68, 101, 18, 98%RA. H/O COPD

## 2022-10-17 ENCOUNTER — Telehealth: Payer: Self-pay

## 2022-10-17 NOTE — Telephone Encounter (Signed)
Please review the schedule and see if there are any earlier appointments.

## 2022-10-17 NOTE — Telephone Encounter (Signed)
There is nothing sooner. Both Dr Delton Coombes and Dr Tonia Brooms are booked.

## 2022-10-17 NOTE — Telephone Encounter (Signed)
-----   Message from Tyro P sent at 10/10/2022  1:51 PM EDT ----- Regarding: Work-In? Received the following message from RN Navigator at [facility]. "This pt was seeing Dr.Byrum for severe emphysema and some nodules enlarged LNs.  Last time he saw Dr Delton Coombes was in March and since his scan was stable, his instructions were to follow up in 1 year. However, he went to the ER yesterday with a COPD exacerbation and the nodule in his RLL went from 2.3cm in March to 5.7cm. He was hesitant to biopsy him in the past because of his severe emphysema. We can see him, but I'm wondering if he should see Dr Delton Coombes first? Can you guys squeeze him in?"  Please advise on work in with Dr. Delton Coombes.

## 2022-10-19 NOTE — Progress Notes (Unsigned)
[name] [phone]   [phone]  CONSULT NOTE  REFERRING PHYSICIAN: Dr. Anitra Lauth  REASON FOR CONSULTATION:  Lung mass  HPI Blake Mcknight is a 67 y.o. male with a past medical history significant for hypertension, bullous emphysema, respiratory failure with hypoxia, COPD, hyperlipidemia, diabetes, and schizophrenia is referred to the clinic for suspicious findings for lung cancer  Per chart review, the patient's been following with pulmonary medicine with Dr. Delton Coombes for a few years.  He has been having some abnormal chest CT scan showing waxing and waning scattered nodular infiltrates, some calcified and subcarinal lymphadenopathy.  The patient is quite debilitated and has significant bullous emphysema and the patient and his sister were unsure how aggressive he would like to be regarding diagnostics or treatment of possible malignancy given the risks involved with biopsy.  Therefore, has been followed with serial imaging.  Last CT of the chest was in March 2024 which compared to his CT scan in December, showed slightly smaller abnormal subcarinal lymph node and stable areas of lung nodularity.  Therefore, a restaging CT scan was recommended in 1 year.  Recently on 10/09/2022 the patient presented to the emergency room shortness of breath and abdominal pain.  He had a CT of the chest performed on 10/09/2022 that showed a new right lower lobe lung mass measuring 5.7 cm previously measuring 2.3 cm concerning for primary lung malignancy, stable enlarged subcarinal lymphadenopathy and additional scattered nodular opacities that are stable.  For significant COPD, coughs daily with mucus production managed with Breztri, prednisone, and nebulizers. He also is on supplemental oxygen.   He is scheduled to follow-up with Dr. Delton Coombes in 11/05/2022  The patient is accompanied by his sister today who provides majority of the history.  The patient lives with his  sister due to his schizophrenia.  The patient states that he feels "all right" today.  The patient sister states that she has been asking him daily for the last few weeks how he is feeling and he states that he is feeling fairly well.  Denies any pain.  He reports his breathing is a little bit better with prednisone.  He does have baseline dyspnea on exertion for which he is on supplemental oxygen.  He does cough on a regular basis and produces phlegm.  He is not taking any medication for phlegm production.  He struggles with constipation and takes MiraLAX.  He denies any fever, chills, or night sweats.  Denies any chest pain or hemoptysis.  Denies any nausea or vomiting.  Denies any headache or visual changes.  The patient's family history consist of a mother who had diabetes, questionable liver cancer, and a nonmalignant breast lesion.  The patient's father had heart disease.  The patient lives with his sister.  He is not married and does not have any children.  He estimates he quit smoking 1.5 to 2 years ago.  They estimate he has been smoking about 30 to 40 years averaging 1 pack of cigarettes per day.  Denies any significant alcohol use.  Denies any drug use currently but did smoke marijuana a long time ago when he was young.   HPI  Past Medical History:  Diagnosis Date   COPD (chronic obstructive pulmonary disease) (HCC)    with bullous emphysema.    Heavy cigarette smoker before 2003   pt claims only 10 cigs per day, never heavier amounts.    HLD (hyperlipidemia)    Hypertension  Schizophrenia (HCC) 06/28/2013   This is a chronic condition and he lives with family    Past Surgical History:  Procedure Laterality Date   CHOLECYSTECTOMY N/A 06/06/2013   Procedure: LAPAROSCOPIC CHOLECYSTECTOMY WITH ATTEMPTED INTRAOPERATIVE CHOLANGIOGRAM;  Surgeon: Liz Malady, MD;  Location: [facility] OR;  Service: General;  Laterality: N/A;   ERCP N/A 06/03/2013   Procedure: ENDOSCOPIC RETROGRADE  CHOLANGIOPANCREATOGRAPHY (ERCP);  Surgeon: Meryl Dare, MD;  Location: [facility] ENDOSCOPY;  Service: Endoscopy;  Laterality: N/A;    Family History  Problem Relation Age of Onset   Diabetes Mellitus II Mother    Diabetes Mother    Heart disease Father    Stroke Maternal Aunt    CAD Neg Hx     Social History Social History   Tobacco Use   Smoking status: Former    Current packs/day: 1.00    Average packs/day: 1 pack/day for 41.0 years (41.0 ttl pk-yrs)    Types: Cigarettes   Smokeless tobacco: Never   Tobacco comments:    Quit 7 - 8 months ago [facility] 07/17/21  Vaping Use   Vaping status: Never Used  Substance Use Topics   Alcohol use: No   Drug use: No    No Known Allergies  Current Outpatient Medications  Medication Sig Dispense Refill   albuterol (VENTOLIN HFA) 108 (90 Base) MCG/ACT inhaler Inhale 2 puffs into the lungs every 4 (four) hours as needed for wheezing or shortness of breath.     albuterol (VENTOLIN HFA) 108 (90 Base) MCG/ACT inhaler Inhale 2 puffs into the lungs every 4 (four) hours as needed for wheezing or shortness of breath. 1 each 1   amLODipine (NORVASC) 10 MG tablet Take 10 mg by mouth daily.     atorvastatin (LIPITOR) 10 MG tablet Take 10 mg by mouth at bedtime.     azithromycin (ZITHROMAX) 250 MG tablet Take 1 tablet (250 mg total) by mouth daily. Take first 2 tablets together, then 1 every day until finished. 6 tablet 0   benzonatate (TESSALON) 200 MG capsule Take 1 capsule (200 mg total) by mouth 2 (two) times daily as needed for cough. 20 capsule 0   benztropine (COGENTIN) 2 MG tablet Take 1 tablet (2 mg total) by mouth 2 (two) times daily. 180 tablet 1   BREZTRI AEROSPHERE 160-9-4.8 MCG/ACT AERO Inhale 2 puffs into the lungs in the morning and at bedtime.     carvedilol (COREG) 3.125 MG tablet Take 1 tablet (3.125 mg total) by mouth 2 (two) times daily with a meal. (Patient taking differently: Take 1.5625-3.125 mg by mouth See admin instructions. Take one  tablet by mouth daily in the morning and one-half tablet by mouth daily in the evening.)  0   castor oil liquid Take 10 mLs by mouth daily as needed for moderate constipation or mild constipation.     cetirizine (ZYRTEC) 10 MG tablet Take 10 mg by mouth daily as needed for allergies or rhinitis.     cholecalciferol (VITAMIN D) 25 MCG (1000 UNIT) tablet Take 4,000 Units by mouth every evening.      famotidine (PEPCID) 40 MG tablet Take 40 mg by mouth 2 (two) times daily.     HYDROCODONE-CHLORPHENIRAMINE PO Take 5 mLs by mouth every 6 (six) hours as needed (cough and pain).     ipratropium-albuterol (DUONEB) 0.5-2.5 (3) MG/3ML SOLN Take 3 mLs by nebulization every 4 (four) hours.     MILK THISTLE PO Take 1 capsule by mouth daily.  mirtazapine (REMERON) 30 MG tablet Take 1 tablet (30 mg total) by mouth at bedtime as needed (insomnia). 20 tablet 0   multivitamin (ONE-A-DAY MEN'S) TABS tablet Take 1 tablet by mouth daily.     OVER THE COUNTER MEDICATION Take 2-4 tablets by mouth daily as needed (constipation). Oxy powder     OXYGEN Inhale 2 L/min into the lungs continuous.      polyethylene glycol (MIRALAX / GLYCOLAX) 17 g packet Take 17 g by mouth daily as needed for mild constipation.     predniSONE (DELTASONE) 20 MG tablet Take 1 tablet (20 mg total) by mouth daily for 30 doses. 30 tablet 0   risperiDONE (RISPERDAL) 3 MG tablet Take 1 tablet (3 mg total) by mouth 2 (two) times daily. 180 tablet 1   sodium chloride (OCEAN) 0.65 % SOLN nasal spray Place 1 spray into both nostrils as needed for congestion.     tamsulosin (FLOMAX) 0.4 MG CAPS capsule Take 0.8 mg by mouth at bedtime.     No current facility-administered medications for this visit.    REVIEW OF SYSTEMS:   Review of Systems  Constitutional: Negative for appetite change, chills, fatigue, fever and unexpected weight change.  HENT:   Negative for mouth sores, nosebleeds, sore throat and trouble swallowing.   Eyes: Negative for eye  problems and icterus.  Respiratory: Positive for baseline dyspnea on exertion and cough.  Negative for hemoptysis and wheezing.   Cardiovascular: Negative for chest pain and leg swelling.  Gastrointestinal: Positive for occasional constipation.  Negative for abdominal pain, diarrhea, nausea and vomiting.  Genitourinary: Negative for bladder incontinence, difficulty urinating, dysuria, frequency and hematuria.   Musculoskeletal: Negative for back pain, gait problem, neck pain and neck stiffness.  Skin: Negative for itching and rash.  Neurological: Negative for dizziness, extremity weakness, gait problem, headaches, light-headedness and seizures.  Hematological: Negative for adenopathy. Does not bruise/bleed easily.  Psychiatric/Behavioral: Negative for confusion, depression and sleep disturbance. The patient is not nervous/anxious.     PHYSICAL EXAMINATION:  Blood pressure 126/70, pulse 95, temperature 97.8 F (36.6 C), temperature source Oral, resp. rate 17, weight 150 lb 14.4 oz (68.4 kg), SpO2 100%.  ECOG PERFORMANCE STATUS: 2  Physical Exam  Constitutional: Oriented to person, place, and time and chronically ill-appearing male, and in no distress.  HENT:  Head: Normocephalic and atraumatic.  Mouth/Throat: Oropharynx is clear and moist. No oropharyngeal exudate.  Eyes: Conjunctivae are normal. Right eye exhibits no discharge. Left eye exhibits no discharge. No scleral icterus.  Neck: Normal range of motion. Neck supple.  Cardiovascular: Normal rate, regular rhythm, normal heart sounds and intact distal pulses.   Pulmonary/Chest: Effort normal.  Quiet breath sounds bilaterally.  On supplemental oxygen.  No respiratory distress. No wheezes. No rales.  Abdominal: Soft. Bowel sounds are normal. Exhibits no distension and no mass. There is no tenderness.  Musculoskeletal: Normal range of motion. Exhibits no edema.  Lymphadenopathy:    No cervical adenopathy.  Neurological: Alert and  oriented to person, place, and time. Exhibits also wasting.  He was examined in the wheelchair.  Skin: Skin is warm and dry. No rash noted. Not diaphoretic. No erythema. No pallor.  Psychiatric: Mood, memory and judgment normal.  He does seem to have some cognitive impairment but is pleasant and does laugh randomly through encounter today.  Vitals reviewed.  LABORATORY DATA: Lab Results  Component Value Date   WBC 11.3 (H) 10/21/2022   HGB 13.5 10/21/2022   HCT 39.9 10/21/2022  MCV 83.6 10/21/2022   PLT 281 10/21/2022      Chemistry      Component Value Date/Time   NA 135 10/21/2022 1320   K 3.8 10/21/2022 1320   CL 97 (L) 10/21/2022 1320   CO2 28 10/21/2022 1320   BUN 7 (L) 10/21/2022 1320   CREATININE 0.75 10/21/2022 1320      Component Value Date/Time   CALCIUM 9.3 10/21/2022 1320   ALKPHOS 53 10/21/2022 1320   AST 10 (L) 10/21/2022 1320   ALT 19 10/21/2022 1320   BILITOT 0.3 10/21/2022 1320       RADIOGRAPHIC STUDIES: CT Chest Wo Contrast  Result Date: 10/09/2022 CLINICAL DATA:  Abnormal chest radiograph; * Tracking Code: BO * EXAM: CT CHEST WITHOUT CONTRAST TECHNIQUE: Multidetector CT imaging of the chest was performed following the standard protocol without IV contrast. RADIATION DOSE REDUCTION: This exam was performed according to the departmental dose-optimization program which includes automated exposure control, adjustment of the mA and/or kV according to patient size and/or use of iterative reconstruction technique. COMPARISON:  Chest CT dated June 03, 2022 FINDINGS: Cardiovascular: Normal heart size. Trace pericardial effusion. Normal caliber thoracic aorta with moderate calcified plaque. Mcknight coronary artery calcifications. Mediastinum/Nodes: Esophagus and thyroid are unremarkable. Stable subcarinal lymph node measuring 3.5 x 2.5 cm, remeasured in similar plane. Lungs/Pleura: Central airways are patent. Mild bilateral bronchiectasis with scattered areas of mucous  plugging Mcknight paraseptal predominant emphysema. Increased size of right lower lobe mass measuring 5.7 x 3.7 cm on series 3, image 95, previously measured 2.3 x 1.6 cm. Additional scattered nodular opacities are stable. Reference stable right middle lobe nodular opacity measuring 2.4 x 1.1 cm on series 3, image 89. No pleural effusion. Upper Abdomen: Hepatic steatosis.  No acute abnormality. Musculoskeletal: No chest wall mass or suspicious bone lesions identified. IMPRESSION: 1. Right lower lobe mass measuring up to 5.7 cm, previously measured 2.3 cm, findings are concerning for primary lung malignancy. 2. Stable enlarged subcarinal lymph node. 3. Additional scattered nodular opacities are stable. 4. Aortic Atherosclerosis (ICD10-I70.0) and Emphysema (ICD10-J43.9). Electronically Signed   By: Allegra Lai M.D.   On: 10/09/2022 17:44   DG Chest 2 View  Result Date: 10/09/2022 CLINICAL DATA:  wheezing, concern for PNA EXAM: CHEST - 2 VIEW COMPARISON:  CXR 10/02/22 FINDINGS: No pleural effusion. No pneumothorax. No focal airspace opacity. Mcknight emphysematous change. Compared to prior exam there is a new airspace opacity at the right lung base which is worrisome for infection. No radiographically apparent displaced rib fractures. Visualized upper abdomen is unremarkable. Vertebral body heights are maintained. IMPRESSION: New airspace opacity at the right lung base is worrisome for infection. Consider further evaluation with chest CT Electronically Signed   By: Lorenza Cambridge M.D.   On: 10/09/2022 16:11   DG Chest Port 1 View  Result Date: 10/02/2022 CLINICAL DATA:  Shortness of breath. EXAM: PORTABLE CHEST 1 VIEW COMPARISON:  September 07, 2022. FINDINGS: The heart size and mediastinal contours are within normal limits. Emphysematous disease is again noted throughout both lungs. No acute pulmonary abnormality is noted. The visualized skeletal structures are unremarkable. IMPRESSION: Mcknight emphysematous disease  is again noted. No acute abnormality seen. Emphysema (ICD10-J43.9). Electronically Signed   By: Lupita Raider M.D.   On: 10/02/2022 12:08    ASSESSMENT: This is a very pleasant 67 year old african [place] male with suspicious lung cancer.  He presented with right lower lobe mass, stable enlarged subcarinal lymphadenopathy, and a few scattered nodular opacities.  Pending further staging workup.  The patient was seen with Dr. Arbutus Ped today.  Dr. Arbutus Ped had a lengthy discussion with the patient today about his current condition and necessary workup.  The patient has Mcknight bullous emphysema on his CT scan.  Dr. Arbutus Ped feels this would likely be very risky for a biopsy for pneumothorax.  The patient is scheduled to see Dr. Delton Coombes on 11/05/2022.  Dr. Arbutus Ped feels it is unlikely that biopsy would be recommended but will defer to Dr. Delton Coombes.  If Dr. Delton Coombes is in agreement to not pursue a biopsy, then Dr. Arbutus Ped would recommend radiation to the right lower lobe lung mass without biopsy.    The patient's most recent PET scan from 07/24/2021 showed intense radiotracer uptake in the subcarinal lymph node and mild FDG uptake in the right lower lobe and left upper lobe nodular density.  Dr. Arbutus Ped feels repeat PET scan will once again redemonstrate suspicious findings for malignancy and hypermetabolic activity.  Dr. Arbutus Ped does not recommend a PET scan or brain MRI for restaging but will defer to radiation oncology if they would like repeat restaging PET scan and brain MRI since the most recent PET scan was from over a year ago.    Dr. Arbutus Ped does not recommend any further follow-up with medical oncology.  Dr. Arbutus Ped explained that he is unable to offer chemotherapy without a biopsy.  Therefore, we will not arrange her surveillance imaging with medical oncology.  We will see if radiation oncology is able to arrange for surveillance imaging and monitoring.  The patient voices understanding of current disease  status and treatment options and is in agreement with the current care plan.  All questions were answered. The patient knows to call the clinic with any problems, questions or concerns. We can certainly see the patient much sooner if necessary.  Thank you so much for allowing me to participate in the care of Blake Mcknight. I will continue to follow up the patient with you and assist in his care.   Disclaimer: This note was dictated with voice recognition software. Similar sounding words can inadvertently be transcribed and may not be corrected upon review.    L  October 21, 2022, 4:33 PM  ADDENDUM: Hematology/Oncology Attending:  I had a face-to-face encounter with the patient today.  I reviewed his record, lab, scan and recommended his care plan.  This is a very pleasant 67 years old African-American male with past medical history significant for multiple medical problems including history of COPD, bullous emphysema, history of respiratory failure, dyslipidemia, diabetes mellitus, hypertension as well as schizophrenia.  The patient has been followed by his pulmonologist Dr. Delton Coombes for several years.  He was noted on previous imaging studies including CT scan of the chest to have a waxing and waning scattered nodular infiltrate some of it are calcified and he also has subcarinal lymphadenopathy.  He had CT scan of the chest in March 2024 that showed abnormal subcarinal lymph node that is smaller in addition to stable areas of lung nodularity and restaging scan was recommended in 1 year but the patient has been complaining of worsening dyspnea recently in addition to abdominal pain.  He presented to the emergency department and repeat imaging studies including CT scan of the chest on 10/09/2022 showed new and enlarging right lower lobe lung mass measuring 5.7 cm compared to 2.3 cm on previous imaging studies and this was suspicious for primary lung malignancy.  The patient also  has a stable  enlargement subcarinal lymphadenopathy.  He had a PET scan last year that showed hypermetabolic activity in these areas. The patient was referred to [place] today for evaluation and recommendation regarding his condition.  He was accompanied by his sister who is a caregiver for him.  He continues to come planing of increasing fatigue and weakness.  He is currently on treatment with prednisone for his COPD.  He was a very high risk for biopsy in the past with his significant bullous emphysema. I had a lengthy discussion with the patient and his sister about his condition and treatment options. I explained to the patient and his sister that his likely high risk for biopsy but I will leave this up to Dr. Delton Coombes if he would consider it.  Without biopsy I will not be able to give the patient any systemic therapy but he could benefit from palliative radiotherapy to the enlarging right lower lobe lung mass and mediastinal lymphadenopathy based on the hypermetabolic activity of this lesion and the enlargement over the last several months. We will refer the patient to radiation oncology for discussion of his treatment option.  We will see the patient on as-needed basis at this point but if he has a tissue diagnosis, I will be happy to reevaluate him for any additional treatment options. The patient and his sister agreed to the current plan. He was advised to call immediately if he has any other concerning issues. The total time spent in the appointment was 60 minutes.  Disclaimer: This note was dictated with voice recognition software. Similar sounding words can inadvertently be transcribed and may be missed upon review. Lajuana Matte, MD

## 2022-10-21 ENCOUNTER — Inpatient Hospital Stay: Payer: Medicare Other | Attending: Physician Assistant | Admitting: Physician Assistant

## 2022-10-21 ENCOUNTER — Telehealth: Payer: Self-pay | Admitting: Radiation Oncology

## 2022-10-21 ENCOUNTER — Other Ambulatory Visit: Payer: Self-pay | Admitting: Physician Assistant

## 2022-10-21 ENCOUNTER — Inpatient Hospital Stay: Payer: Medicare Other

## 2022-10-21 VITALS — BP 126/70 | HR 95 | Temp 97.8°F | Resp 17 | Wt 150.9 lb

## 2022-10-21 DIAGNOSIS — I700 Atherosclerosis of aorta: Secondary | ICD-10-CM | POA: Diagnosis not present

## 2022-10-21 DIAGNOSIS — Z8249 Family history of ischemic heart disease and other diseases of the circulatory system: Secondary | ICD-10-CM | POA: Diagnosis not present

## 2022-10-21 DIAGNOSIS — I3139 Other pericardial effusion (noninflammatory): Secondary | ICD-10-CM | POA: Insufficient documentation

## 2022-10-21 DIAGNOSIS — E119 Type 2 diabetes mellitus without complications: Secondary | ICD-10-CM | POA: Insufficient documentation

## 2022-10-21 DIAGNOSIS — E785 Hyperlipidemia, unspecified: Secondary | ICD-10-CM | POA: Diagnosis not present

## 2022-10-21 DIAGNOSIS — J449 Chronic obstructive pulmonary disease, unspecified: Secondary | ICD-10-CM

## 2022-10-21 DIAGNOSIS — R911 Solitary pulmonary nodule: Secondary | ICD-10-CM | POA: Diagnosis not present

## 2022-10-21 DIAGNOSIS — K760 Fatty (change of) liver, not elsewhere classified: Secondary | ICD-10-CM | POA: Insufficient documentation

## 2022-10-21 DIAGNOSIS — R918 Other nonspecific abnormal finding of lung field: Secondary | ICD-10-CM | POA: Diagnosis not present

## 2022-10-21 DIAGNOSIS — Z833 Family history of diabetes mellitus: Secondary | ICD-10-CM | POA: Insufficient documentation

## 2022-10-21 DIAGNOSIS — Z79899 Other long term (current) drug therapy: Secondary | ICD-10-CM | POA: Insufficient documentation

## 2022-10-21 DIAGNOSIS — Z7952 Long term (current) use of systemic steroids: Secondary | ICD-10-CM | POA: Diagnosis not present

## 2022-10-21 DIAGNOSIS — J439 Emphysema, unspecified: Secondary | ICD-10-CM | POA: Diagnosis not present

## 2022-10-21 DIAGNOSIS — I10 Essential (primary) hypertension: Secondary | ICD-10-CM | POA: Diagnosis not present

## 2022-10-21 DIAGNOSIS — J438 Other emphysema: Secondary | ICD-10-CM | POA: Diagnosis not present

## 2022-10-21 DIAGNOSIS — R0609 Other forms of dyspnea: Secondary | ICD-10-CM

## 2022-10-21 DIAGNOSIS — J479 Bronchiectasis, uncomplicated: Secondary | ICD-10-CM | POA: Diagnosis not present

## 2022-10-21 DIAGNOSIS — K5900 Constipation, unspecified: Secondary | ICD-10-CM | POA: Diagnosis not present

## 2022-10-21 DIAGNOSIS — F209 Schizophrenia, unspecified: Secondary | ICD-10-CM | POA: Diagnosis not present

## 2022-10-21 DIAGNOSIS — Z823 Family history of stroke: Secondary | ICD-10-CM | POA: Diagnosis not present

## 2022-10-21 DIAGNOSIS — Z9049 Acquired absence of other specified parts of digestive tract: Secondary | ICD-10-CM | POA: Insufficient documentation

## 2022-10-21 DIAGNOSIS — Z87891 Personal history of nicotine dependence: Secondary | ICD-10-CM | POA: Insufficient documentation

## 2022-10-21 DIAGNOSIS — R9389 Abnormal findings on diagnostic imaging of other specified body structures: Secondary | ICD-10-CM

## 2022-10-21 LAB — CMP (CANCER CENTER ONLY)
ALT: 19 U/L (ref 0–44)
AST: 10 U/L — ABNORMAL LOW (ref 15–41)
Albumin: 3.6 g/dL (ref 3.5–5.0)
Alkaline Phosphatase: 53 U/L (ref 38–126)
Anion gap: 10 (ref 5–15)
BUN: 7 mg/dL — ABNORMAL LOW (ref 8–23)
CO2: 28 mmol/L (ref 22–32)
Calcium: 9.3 mg/dL (ref 8.9–10.3)
Chloride: 97 mmol/L — ABNORMAL LOW (ref 98–111)
Creatinine: 0.75 mg/dL (ref 0.61–1.24)
GFR, Estimated: >60 mL/min (ref >60)
Glucose, Bld: 127 mg/dL — ABNORMAL HIGH (ref 70–99)
Potassium: 3.8 mmol/L (ref 3.5–5.1)
Sodium: 135 mmol/L (ref 135–145)
Total Bilirubin: 0.3 mg/dL (ref 0.3–1.2)
Total Protein: 7 g/dL (ref 6.5–8.1)

## 2022-10-21 LAB — CBC WITH DIFFERENTIAL (CANCER CENTER ONLY)
Abs Immature Granulocytes: 0.08 10*3/uL — ABNORMAL HIGH (ref 0.00–0.07)
Basophils Absolute: 0.1 10*3/uL (ref 0.0–0.1)
Basophils Relative: 0 %
Eosinophils Absolute: 0.1 10*3/uL (ref 0.0–0.5)
Eosinophils Relative: 1 %
HCT: 39.9 % (ref 39.0–52.0)
Hemoglobin: 13.5 g/dL (ref 13.0–17.0)
Immature Granulocytes: 1 %
Lymphocytes Relative: 15 %
Lymphs Abs: 1.7 10*3/uL (ref 0.7–4.0)
MCH: 28.3 pg (ref 26.0–34.0)
MCHC: 33.8 g/dL (ref 30.0–36.0)
MCV: 83.6 fL (ref 80.0–100.0)
Monocytes Absolute: 0.6 10*3/uL (ref 0.1–1.0)
Monocytes Relative: 5 %
Neutro Abs: 8.8 10*3/uL — ABNORMAL HIGH (ref 1.7–7.7)
Neutrophils Relative %: 78 %
Platelet Count: 281 10*3/uL (ref 150–400)
RBC: 4.77 MIL/uL (ref 4.22–5.81)
RDW: 14.6 % (ref 11.5–15.5)
WBC Count: 11.3 10*3/uL — ABNORMAL HIGH (ref 4.0–10.5)
nRBC: 0 % (ref 0.0–0.2)

## 2022-10-21 NOTE — Telephone Encounter (Signed)
Spoke to pt's sister who asked to call tomorrow 8/7 to schedule appt since she did not have his calendar of appts available at the moment. I will reach out tomorrow and schedule appt.

## 2022-10-21 NOTE — Patient Instructions (Addendum)
It was nice meeting you.  There is mass in the right lower lung. This looks like cancer. Unfortunately, it would be risky to do a biopsy because of how bad the emphysema is. It would be risky for collapse lung. Therefore, Dr. Arbutus Ped does NOT recommend biopsy (bronchoscopy). Therefore, this looks like cancer so we will just go ahead and treat like this is cancer. Therefore, we will refer him to a radiation doctor to see if they can give radiation to this area to shrink this area.  -They are in the basement in this building. They will call you to schedule a consultation.  -We cannot give chemo because we need to have a biopsy.  -Dr. Arbutus Ped will leave it to radiation oncology if they want the PET scan and brain MRI.

## 2022-10-23 ENCOUNTER — Telehealth (HOSPITAL_BASED_OUTPATIENT_CLINIC_OR_DEPARTMENT_OTHER): Payer: Medicare Other | Admitting: Psychiatry

## 2022-10-23 ENCOUNTER — Encounter (HOSPITAL_COMMUNITY): Payer: Self-pay | Admitting: Psychiatry

## 2022-10-23 VITALS — Wt 150.0 lb

## 2022-10-23 DIAGNOSIS — F5105 Insomnia due to other mental disorder: Secondary | ICD-10-CM

## 2022-10-23 DIAGNOSIS — F200 Paranoid schizophrenia: Secondary | ICD-10-CM | POA: Diagnosis not present

## 2022-10-23 MED ORDER — MIRTAZAPINE 30 MG PO TABS
30.0000 mg | ORAL_TABLET | Freq: Every evening | ORAL | 0 refills | Status: DC | PRN
Start: 2022-10-23 — End: 2023-03-27

## 2022-10-23 MED ORDER — BENZTROPINE MESYLATE 2 MG PO TABS
2.0000 mg | ORAL_TABLET | Freq: Two times a day (BID) | ORAL | 1 refills | Status: DC
Start: 2022-10-23 — End: 2023-03-27

## 2022-10-23 MED ORDER — RISPERIDONE 3 MG PO TABS
3.0000 mg | ORAL_TABLET | Freq: Two times a day (BID) | ORAL | 1 refills | Status: DC
Start: 2022-10-23 — End: 2023-03-27

## 2022-10-23 NOTE — Progress Notes (Signed)
Thoracic Location of Tumor / Histology: Right Lower Lobe Lung   Patient presented with abnormal CT Chest.  He has been followed with serial imaging.  His latest CT March 2024 noted stable areas of lung nodularity.  He presented to the ER on 10/09/2022 with complaints of shortness of breath and abdominal pain.  CT Chest 10/09/2022: New right lower lobe lung mass measuring 5.7 cm previously measuring 2.3 cm concerning for primary lung malignancy, stable enlarged subcarinal lymphadenopathy and additional scattered nodular opacities that are stable.   Biopsies of   Tobacco/Marijuana/Snuff/ETOH use: Former smoker, quit 1.5-2 years ago.  Past/Anticipated interventions by cardiothoracic surgery, if any:    Past/Anticipated interventions by medical oncology, if any:  Cassie Heilingoetter / Dr. Arbutus Ped 10/21/2022 -The patient has severe bullous emphysema on his CT scan. Dr. Arbutus Ped feels this would likely be very risky for a biopsy for pneumothorax.  -The patient is scheduled to see Dr. Delton Coombes on 11/05/2022. Dr. Arbutus Ped feels it is unlikely that biopsy would be recommended but will defer to Dr. Delton Coombes.  -If Dr. Delton Coombes is in agreement to not pursue a biopsy, then Dr. Arbutus Ped would recommend radiation to the right lower lobe lung mass without biopsy.  -Dr. Arbutus Ped feels repeat PET scan will once again redemonstrate suspicious findings for malignancy and hypermetabolic activity. Dr. Arbutus Ped does not recommend a PET scan or brain MRI for restaging but will defer to radiation oncology if they would like repeat restaging PET scan and brain MRI since the most recent PET scan was from over a year ago.    Signs/Symptoms Weight changes, if any: None Respiratory complaints, if any: He has some SOB, baseline due to COPD.  He wears 2 liters oxygen. Hemoptysis, if any: He has productive cough, baseline. Pain issues, if any:    SAFETY ISSUES: Prior radiation? No Pacemaker/ICD?  No Possible current pregnancy? N/a Is  the patient on methotrexate? No  Current Complaints / other details:

## 2022-10-23 NOTE — Progress Notes (Signed)
Radiation Oncology         ([phone]) [phone] ________________________________  Name: Blake Mcknight        MRN: [id number]  Date of Service: 10/24/2022 DOB: December 02, 1955  WU:[id number], Blake Stallion, MD  Si Gaul, MD     REFERRING PHYSICIAN: Si Gaul, MD   DIAGNOSIS: The encounter diagnosis was Malignant neoplasm of lower lobe of right lung [facility]).   HISTORY OF PRESENT ILLNESS: Blake Mcknight is a 67 y.o. male with a past medical history significant for hypertension, bullous emphysema, respiratory failure with hypoxia, COPD, and schizophrenia being seen at the request of Dr. Arbutus Ped for an enlarging lung mass. He had been following with Dr. Delton Coombes for a few years. Follow-up chest CTs had been showing waxing and waning of nodular infiltrates, some calcified and subcarinal lymphadenopathy.   Most recently, patient presented to the emergency room with shortness of breath and abdominal pain. Chest CT performed on 10/09/22 showed a new right lower lobe lung mass measuring 5.7 cm that previously measured 2.3 cm which was concerning for primary lung malignancy, stable enlarged subcarinal lymphadenopathy, and additional scattered nodular opacities that are stable. He was referred to Dr. Arbutus Ped for these findings on 10/21/22.   Of note, patient did have a PET scan on 07/27/22 that showed uptake in the subcarinal lymph node, nodular densities within the right middle lobe, and a peripheral cavitary nodule within the superior segment of the right upper lobe.   Dr. Arbutus Ped had discussed at length with the patient about his current condition. He felt that a biopsy would be very risky given his severe bullous emphysema. He will defer this decision to Dr. Delton Coombes when the patient sees him again on 11/05/22. If the patient is not able to proceed with a biopsy, Dr. Arbutus Ped is not able to offer chemotherapy for this patient.   We are seeing the patient today to discuss possible radiation treatment  options.   He is present today with his supportive sister; she provided most of the history today. He states his breathing has been "not great". He has a productive cough consisting of green phlegm, but denies hemoptysis. He wears 2 liters of oxygen. He is ambulating via wheelchair today, but only does this for long distances.    PREVIOUS RADIATION THERAPY: No   PAST MEDICAL HISTORY:  Past Medical History:  Diagnosis Date   COPD (chronic obstructive pulmonary disease) (HCC)    with bullous emphysema.    Heavy cigarette smoker before 2003   pt claims only 10 cigs per day, never heavier amounts.    HLD (hyperlipidemia)    Hypertension    Schizophrenia (HCC) 06/28/2013   This is a chronic condition and he lives with family       PAST SURGICAL HISTORY: Past Surgical History:  Procedure Laterality Date   CHOLECYSTECTOMY N/A 06/06/2013   Procedure: LAPAROSCOPIC CHOLECYSTECTOMY WITH ATTEMPTED INTRAOPERATIVE CHOLANGIOGRAM;  Surgeon: Liz Malady, MD;  Location: [facility] OR;  Service: General;  Laterality: N/A;   ERCP N/A 06/03/2013   Procedure: ENDOSCOPIC RETROGRADE CHOLANGIOPANCREATOGRAPHY (ERCP);  Surgeon: Meryl Dare, MD;  Location: [facility] ENDOSCOPY;  Service: Endoscopy;  Laterality: N/A;     FAMILY HISTORY:  Family History  Problem Relation Age of Onset   Diabetes Mellitus II Mother    Diabetes Mother    Heart disease Father    Stroke Maternal Aunt    CAD Neg Hx      SOCIAL HISTORY:  reports that he has quit smoking. His smoking  use included cigarettes. He has a 41 pack-year smoking history. He has never used smokeless tobacco. He reports that he does not drink alcohol and does not use drugs.   ALLERGIES: Patient has no known allergies.   MEDICATIONS:  Current Outpatient Medications  Medication Sig Dispense Refill   albuterol (VENTOLIN HFA) 108 (90 Base) MCG/ACT inhaler Inhale 2 puffs into the lungs every 4 (four) hours as needed for wheezing or shortness of breath.      albuterol (VENTOLIN HFA) 108 (90 Base) MCG/ACT inhaler Inhale 2 puffs into the lungs every 4 (four) hours as needed for wheezing or shortness of breath. 1 each 1   amLODipine (NORVASC) 10 MG tablet Take 10 mg by mouth daily.     atorvastatin (LIPITOR) 10 MG tablet Take 10 mg by mouth at bedtime.     azithromycin (ZITHROMAX) 250 MG tablet Take 1 tablet (250 mg total) by mouth daily. Take first 2 tablets together, then 1 every day until finished. 6 tablet 0   benzonatate (TESSALON) 200 MG capsule Take 1 capsule (200 mg total) by mouth 2 (two) times daily as needed for cough. 20 capsule 0   benztropine (COGENTIN) 2 MG tablet Take 1 tablet (2 mg total) by mouth 2 (two) times daily. 180 tablet 1   BREZTRI AEROSPHERE 160-9-4.8 MCG/ACT AERO Inhale 2 puffs into the lungs in the morning and at bedtime.     carvedilol (COREG) 3.125 MG tablet Take 1 tablet (3.125 mg total) by mouth 2 (two) times daily with a meal. (Patient taking differently: Take 1.5625-3.125 mg by mouth See admin instructions. Take one tablet by mouth daily in the morning and one-half tablet by mouth daily in the evening.)  0   castor oil liquid Take 10 mLs by mouth daily as needed for moderate constipation or mild constipation.     cetirizine (ZYRTEC) 10 MG tablet Take 10 mg by mouth daily as needed for allergies or rhinitis.     cholecalciferol (VITAMIN D) 25 MCG (1000 UNIT) tablet Take 4,000 Units by mouth every evening.      famotidine (PEPCID) 40 MG tablet Take 40 mg by mouth 2 (two) times daily.     HYDROCODONE-CHLORPHENIRAMINE PO Take 5 mLs by mouth every 6 (six) hours as needed (cough and pain).     ipratropium-albuterol (DUONEB) 0.5-2.5 (3) MG/3ML SOLN Take 3 mLs by nebulization every 4 (four) hours.     MILK THISTLE PO Take 1 capsule by mouth daily.     mirtazapine (REMERON) 30 MG tablet Take 1 tablet (30 mg total) by mouth at bedtime as needed (insomnia). 30 tablet 0   multivitamin (ONE-A-DAY MEN'S) TABS tablet Take 1 tablet by  mouth daily.     OVER THE COUNTER MEDICATION Take 2-4 tablets by mouth daily as needed (constipation). Oxy powder     OXYGEN Inhale 2 L/min into the lungs continuous.      polyethylene glycol (MIRALAX / GLYCOLAX) 17 g packet Take 17 g by mouth daily as needed for mild constipation.     predniSONE (DELTASONE) 20 MG tablet Take 1 tablet (20 mg total) by mouth daily for 30 doses. 30 tablet 0   risperiDONE (RISPERDAL) 3 MG tablet Take 1 tablet (3 mg total) by mouth 2 (two) times daily. 180 tablet 1   sodium chloride (OCEAN) 0.65 % SOLN nasal spray Place 1 spray into both nostrils as needed for congestion.     tamsulosin (FLOMAX) 0.4 MG CAPS capsule Take 0.8 mg by mouth at  bedtime.     No current facility-administered medications for this encounter.     REVIEW OF SYSTEMS: Notable for that above.      PHYSICAL EXAM:  Wt Readings from Last 3 Encounters:  10/24/22 149 lb (67.6 kg)  10/21/22 150 lb 14.4 oz (68.4 kg)  10/02/22 158 lb 11.7 oz (72 kg)   Temp Readings from Last 3 Encounters:  10/24/22 97.8 F (36.6 C) (Temporal)  10/21/22 97.8 F (36.6 C) (Oral)  10/09/22 98.7 F (37.1 C) (Oral)   BP Readings from Last 3 Encounters:  10/24/22 137/86  10/21/22 126/70  10/09/22 122/70   Pulse Readings from Last 3 Encounters:  10/24/22 85  10/21/22 95  10/09/22 78   Pain Assessment Pain Score: 0-No pain/10  In general this is a chronically ill appearing male in no acute distress. He's alert and appropriate throughout the examination. Cardiopulmonary assessment is negative for acute distress and he exhibits normal effort.     ECOG = 0  0 - Asymptomatic (Fully active, able to carry on all predisease activities without restriction)  1 - Symptomatic but completely ambulatory (Restricted in physically strenuous activity but ambulatory and able to carry out work of a light or sedentary nature. For example, light housework, office work)  2 - Symptomatic, <50% in bed during the day  (Ambulatory and capable of all self care but unable to carry out any work activities. Up and about more than 50% of waking hours)  3 - Symptomatic, >50% in bed, but not bedbound (Capable of only limited self-care, confined to bed or chair 50% or more of waking hours)  4 - Bedbound (Completely disabled. Cannot carry on any self-care. Totally confined to bed or chair)  5 - Death   Santiago Glad MM, Creech RH, Tormey DC, et al. [phone]). "Toxicity and response criteria of the [facility]". Am. Evlyn Clines. Oncol. 5 (6): 649-55    LABORATORY DATA:  Lab Results  Component Value Date   WBC 11.3 (H) 10/21/2022   HGB 13.5 10/21/2022   HCT 39.9 10/21/2022   MCV 83.6 10/21/2022   PLT 281 10/21/2022   Lab Results  Component Value Date   NA 135 10/21/2022   K 3.8 10/21/2022   CL 97 (L) 10/21/2022   CO2 28 10/21/2022   Lab Results  Component Value Date   ALT 19 10/21/2022   AST 10 (L) 10/21/2022   ALKPHOS 53 10/21/2022   BILITOT 0.3 10/21/2022      RADIOGRAPHY: CT Chest Wo Contrast  Result Date: 10/09/2022 CLINICAL DATA:  Abnormal chest radiograph; * Tracking Code: BO * EXAM: CT CHEST WITHOUT CONTRAST TECHNIQUE: Multidetector CT imaging of the chest was performed following the standard protocol without IV contrast. RADIATION DOSE REDUCTION: This exam was performed according to the departmental dose-optimization program which includes automated exposure control, adjustment of the mA and/or kV according to patient size and/or use of iterative reconstruction technique. COMPARISON:  Chest CT dated June 03, 2022 FINDINGS: Cardiovascular: Normal heart size. Trace pericardial effusion. Normal caliber thoracic aorta with moderate calcified plaque. Severe coronary artery calcifications. Mediastinum/Nodes: Esophagus and thyroid are unremarkable. Stable subcarinal lymph node measuring 3.5 x 2.5 cm, remeasured in similar plane. Lungs/Pleura: Central airways are patent. Mild bilateral  bronchiectasis with scattered areas of mucous plugging severe paraseptal predominant emphysema. Increased size of right lower lobe mass measuring 5.7 x 3.7 cm on series 3, image 95, previously measured 2.3 x 1.6 cm. Additional scattered nodular opacities are stable. Reference stable  right middle lobe nodular opacity measuring 2.4 x 1.1 cm on series 3, image 89. No pleural effusion. Upper Abdomen: Hepatic steatosis.  No acute abnormality. Musculoskeletal: No chest wall mass or suspicious bone lesions identified. IMPRESSION: 1. Right lower lobe mass measuring up to 5.7 cm, previously measured 2.3 cm, findings are concerning for primary lung malignancy. 2. Stable enlarged subcarinal lymph node. 3. Additional scattered nodular opacities are stable. 4. Aortic Atherosclerosis (ICD10-I70.0) and Emphysema (ICD10-J43.9). Electronically Signed   By: Allegra Lai M.D.   On: 10/09/2022 17:44   DG Chest 2 View  Result Date: 10/09/2022 CLINICAL DATA:  wheezing, concern for PNA EXAM: CHEST - 2 VIEW COMPARISON:  CXR 10/02/22 FINDINGS: No pleural effusion. No pneumothorax. No focal airspace opacity. Severe emphysematous change. Compared to prior exam there is a new airspace opacity at the right lung base which is worrisome for infection. No radiographically apparent displaced rib fractures. Visualized upper abdomen is unremarkable. Vertebral body heights are maintained. IMPRESSION: New airspace opacity at the right lung base is worrisome for infection. Consider further evaluation with chest CT Electronically Signed   By: Lorenza Cambridge M.D.   On: 10/09/2022 16:11   DG Chest Port 1 View  Result Date: 10/02/2022 CLINICAL DATA:  Shortness of breath. EXAM: PORTABLE CHEST 1 VIEW COMPARISON:  September 07, 2022. FINDINGS: The heart size and mediastinal contours are within normal limits. Emphysematous disease is again noted throughout both lungs. No acute pulmonary abnormality is noted. The visualized skeletal structures are  unremarkable. IMPRESSION: Severe emphysematous disease is again noted. No acute abnormality seen. Emphysema (ICD10-J43.9). Electronically Signed   By: Lupita Raider M.D.   On: 10/02/2022 12:08       IMPRESSION/PLAN: 1. Pudative stage IIIB (T3, N2, MX) lung cancer of the RLL  It was a pleasure meeting this patient and his family today. He is not a good candidate for a lung biopsy because of his advanced lung disease. Patient's sister states Dr. Delton Coombes has already shared that the patient is not a good candidate for a lung biopsy. We will reach out to Dr. Delton Coombes today to confirm this.   Dr. [name] is in agreement with plans for stereotactic body radiotherapy (SBRT) to the RLL. Given his advanced lung disease, we will not treat the stable, enlarged subcarinal lymph node but will monitor it through follow-up imaging.    We discussed the risks, benefits, short, and long term effects of radiotherapy, as well as the palliative intent, and the patient is interested in proceeding. Dr. [name] discussed the delivery and logistics of radiotherapy and anticipates a course of 3-5 fractions of SBRT to the RLL lung mass.  The patient and his sister have a good understanding of the treatment plan and is enthusiastic about beginning treatment. All questions were answered. A consent form was signed today and placed in patient's chart. We will schedule a CT simulation and plan to begin radiation treatment soon after. We look forward to participating in this patient's care.     In a visit lasting 60 minutes, greater than 50% of the time was spent face to face discussing the patient's condition, in preparation for the discussion, and coordinating the patient's care.   The above documentation reflects my direct findings during this shared patient visit. Please see the separate note by Dr. [name] on this date for the remainder of the patient's plan of care.    Joyice Faster, PA-C    **Disclaimer: This note was dictated with  voice recognition  software. Similar sounding words can inadvertently be transcribed and this note may contain transcription errors which may not have been corrected upon publication of note.**

## 2022-10-23 NOTE — Progress Notes (Signed)
[name] Virtual Progress Note   Patient Location: Home Provider Location: Home Office  I connect with patient by telephone and verified that I am speaking with correct person by using two identifiers. I discussed the limitations of evaluation and management by telemedicine and the availability of in person appointments. I also discussed with the patient that there may be a patient responsible charge related to this service. The patient expressed understanding and agreed to proceed.  Blake Mcknight [id number] 67 y.o.  10/23/2022 2:11 PM  History of Present Illness:  Patient is evaluated by phone session.  His sister helped him during the session as patient is a poor historian.  He could not do video session.  Sister told that he is admitted and seen in the emergency room a few times because of breathing issues.  She also mentioned they found a spot in the lung and he had a appointment coming up with the pulmonologist.  As per sister he is not agitated, angry and he sleeps good.  Sister noted sometime he sleeps too much.  On further questioning she admitted giving him mirtazapine which he was last time told to take only as needed because of excessive sedation.  Patient told he is fine with the current medication and do not have any hallucination, paranoia, suicidal thoughts.  His appetite is okay.  He does go outside with his sister but does not drive.  His behavior is manageable.  He has chronic paranoia but symptoms are under control.  He is on risperidone and moderate dose of Cogentin.  Sometimes he has shakes but does not want to cut down the risperidone dose.  Past Psychiatric History: H/O schizophrenia and admitted at [facility] years ago. H/O paranoid, delusional, disorganized behavior and having mood swings.     Outpatient Encounter Medications as of 10/23/2022  Medication Sig   albuterol (VENTOLIN HFA) 108 (90 Base) MCG/ACT inhaler Inhale 2 puffs into the  lungs every 4 (four) hours as needed for wheezing or shortness of breath.   albuterol (VENTOLIN HFA) 108 (90 Base) MCG/ACT inhaler Inhale 2 puffs into the lungs every 4 (four) hours as needed for wheezing or shortness of breath.   amLODipine (NORVASC) 10 MG tablet Take 10 mg by mouth daily.   atorvastatin (LIPITOR) 10 MG tablet Take 10 mg by mouth at bedtime.   azithromycin (ZITHROMAX) 250 MG tablet Take 1 tablet (250 mg total) by mouth daily. Take first 2 tablets together, then 1 every day until finished.   benzonatate (TESSALON) 200 MG capsule Take 1 capsule (200 mg total) by mouth 2 (two) times daily as needed for cough.   benztropine (COGENTIN) 2 MG tablet Take 1 tablet (2 mg total) by mouth 2 (two) times daily.   BREZTRI AEROSPHERE 160-9-4.8 MCG/ACT AERO Inhale 2 puffs into the lungs in the morning and at bedtime.   carvedilol (COREG) 3.125 MG tablet Take 1 tablet (3.125 mg total) by mouth 2 (two) times daily with a meal. (Patient taking differently: Take 1.5625-3.125 mg by mouth See admin instructions. Take one tablet by mouth daily in the morning and one-half tablet by mouth daily in the evening.)   castor oil liquid Take 10 mLs by mouth daily as needed for moderate constipation or mild constipation.   cetirizine (ZYRTEC) 10 MG tablet Take 10 mg by mouth daily as needed for allergies or rhinitis.   cholecalciferol (VITAMIN D) 25 MCG (1000 UNIT) tablet Take 4,000 Units by mouth every evening.  famotidine (PEPCID) 40 MG tablet Take 40 mg by mouth 2 (two) times daily.   HYDROCODONE-CHLORPHENIRAMINE PO Take 5 mLs by mouth every 6 (six) hours as needed (cough and pain).   ipratropium-albuterol (DUONEB) 0.5-2.5 (3) MG/3ML SOLN Take 3 mLs by nebulization every 4 (four) hours.   MILK THISTLE PO Take 1 capsule by mouth daily.   mirtazapine (REMERON) 30 MG tablet Take 1 tablet (30 mg total) by mouth at bedtime as needed (insomnia).   multivitamin (ONE-A-DAY MEN'S) TABS tablet Take 1 tablet by mouth  daily.   OVER THE COUNTER MEDICATION Take 2-4 tablets by mouth daily as needed (constipation). Oxy powder   OXYGEN Inhale 2 L/min into the lungs continuous.    polyethylene glycol (MIRALAX / GLYCOLAX) 17 g packet Take 17 g by mouth daily as needed for mild constipation.   predniSONE (DELTASONE) 20 MG tablet Take 1 tablet (20 mg total) by mouth daily for 30 doses.   risperiDONE (RISPERDAL) 3 MG tablet Take 1 tablet (3 mg total) by mouth 2 (two) times daily.   sodium chloride (OCEAN) 0.65 % SOLN nasal spray Place 1 spray into both nostrils as needed for congestion.   tamsulosin (FLOMAX) 0.4 MG CAPS capsule Take 0.8 mg by mouth at bedtime.   No facility-administered encounter medications on file as of 10/23/2022.    Recent Results (from the past 2160 hour(s))  Basic metabolic panel     Status: Abnormal   Collection Time: 09/07/22  2:10 PM  Result Value Ref Range   Sodium 133 (L) 135 - 145 mmol/L   Potassium 3.8 3.5 - 5.1 mmol/L   Chloride 101 98 - 111 mmol/L   CO2 21 (L) 22 - 32 mmol/L   Glucose, Bld 116 (H) 70 - 99 mg/dL    Comment: Glucose reference range applies only to samples taken after fasting for at least 8 hours.   BUN <5 (L) 8 - 23 mg/dL   Creatinine, Ser 2.37 0.61 - 1.24 mg/dL   Calcium 9.0 8.9 - 62.8 mg/dL   GFR, Estimated >31 >51 mL/min    Comment: (NOTE) Calculated using the CKD-EPI Creatinine Equation (2021)    Anion gap 11 5 - 15    Comment: Performed at [facility], [address], [place], [place] [address]  CBC     Status: None   Collection Time: 09/07/22  2:10 PM  Result Value Ref Range   WBC 9.1 4.0 - 10.5 K/uL   RBC 5.09 4.22 - 5.81 MIL/uL   Hemoglobin 14.5 13.0 - 17.0 g/dL   HCT 73.7 10.6 - 26.9 %   MCV 86.6 80.0 - 100.0 fL   MCH 28.5 26.0 - 34.0 pg   MCHC 32.9 30.0 - 36.0 g/dL   RDW 48.5 46.2 - 70.3 %   Platelets 304 150 - 400 K/uL   nRBC 0.0 0.0 - 0.2 %    Comment: Performed at [facility], [address], [place], [place] [address]   I-Stat venous blood gas, ED     Status: Abnormal   Collection Time: 09/07/22  3:32 PM  Result Value Ref Range   pH, Ven 7.386 7.25 - 7.43   pCO2, Ven 45.0 44 - 60 mmHg   pO2, Ven 152 (H) 32 - 45 mmHg   Bicarbonate 26.9 20.0 - 28.0 mmol/L   TCO2 28 22 - 32 mmol/L   O2 Saturation 99 %   Acid-Base Excess 1.0 0.0 - 2.0 mmol/L   Sodium 135  135 - 145 mmol/L   Potassium 3.9 3.5 - 5.1 mmol/L   Calcium, Ion 1.16 1.15 - 1.40 mmol/L   HCT 46.0 39.0 - 52.0 %   Hemoglobin 15.6 13.0 - 17.0 g/dL   Sample type VENOUS   CBC     Status: Abnormal   Collection Time: 10/02/22 12:22 PM  Result Value Ref Range   WBC 12.1 (H) 4.0 - 10.5 K/uL   RBC 4.85 4.22 - 5.81 MIL/uL   Hemoglobin 13.6 13.0 - 17.0 g/dL   HCT 40.9 81.1 - 91.4 %   MCV 85.4 80.0 - 100.0 fL   MCH 28.0 26.0 - 34.0 pg   MCHC 32.9 30.0 - 36.0 g/dL   RDW 78.2 95.6 - 21.3 %   Platelets 341 150 - 400 K/uL   nRBC 0.0 0.0 - 0.2 %    Comment: Performed at [facility], [address], [place], [place] [address]  Basic metabolic panel     Status: Abnormal   Collection Time: 10/02/22 12:22 PM  Result Value Ref Range   Sodium 133 (L) 135 - 145 mmol/L   Potassium 3.8 3.5 - 5.1 mmol/L   Chloride 99 98 - 111 mmol/L   CO2 25 22 - 32 mmol/L   Glucose, Bld 112 (H) 70 - 99 mg/dL    Comment: Glucose reference range applies only to samples taken after fasting for at least 8 hours.   BUN 10 8 - 23 mg/dL   Creatinine, Ser 8.46 0.61 - 1.24 mg/dL   Calcium 8.5 (L) 8.9 - 10.3 mg/dL   GFR, Estimated >96 >29 mL/min    Comment: (NOTE) Calculated using the CKD-EPI Creatinine Equation (2021)    Anion gap 9 5 - 15    Comment: Performed at [facility], [address] [place], [place] [address]  CBC with Differential     Status: Abnormal   Collection Time: 10/09/22  4:04 PM  Result Value Ref Range   WBC 18.9 (H) 4.0 - 10.5 K/uL   RBC 5.08 4.22 - 5.81 MIL/uL   Hemoglobin 14.1 13.0 - 17.0 g/dL   HCT 32.4 40.1 - 02.7 %   MCV 84.3 80.0 -  100.0 fL   MCH 27.8 26.0 - 34.0 pg   MCHC 32.9 30.0 - 36.0 g/dL   RDW 25.3 66.4 - 40.3 %   Platelets 293 150 - 400 K/uL   nRBC 0.0 0.0 - 0.2 %   Neutrophils Relative % 91 %   Neutro Abs 17.2 (H) 1.7 - 7.7 K/uL   Lymphocytes Relative 6 %   Lymphs Abs 1.1 0.7 - 4.0 K/uL   Monocytes Relative 2 %   Monocytes Absolute 0.3 0.1 - 1.0 K/uL   Eosinophils Relative 0 %   Eosinophils Absolute 0.0 0.0 - 0.5 K/uL   Basophils Relative 0 %   Basophils Absolute 0.1 0.0 - 0.1 K/uL   Immature Granulocytes 1 %   Abs Immature Granulocytes 0.24 (H) 0.00 - 0.07 K/uL    Comment: Performed at [facility], [address], [place], [place] [address]  Basic metabolic panel     Status: Abnormal   Collection Time: 10/09/22  4:04 PM  Result Value Ref Range   Sodium 131 (L) 135 - 145 mmol/L   Potassium 4.3 3.5 - 5.1 mmol/L   Chloride 96 (L) 98 - 111 mmol/L   CO2 21 (L) 22 - 32 mmol/L   Glucose, Bld 174 (H) 70 - 99 mg/dL  Comment: Glucose reference range applies only to samples taken after fasting for at least 8 hours.   BUN 18 8 - 23 mg/dL   Creatinine, Ser 3.15 0.61 - 1.24 mg/dL   Calcium 8.9 8.9 - 40.0 mg/dL   GFR, Estimated >86 >76 mL/min    Comment: (NOTE) Calculated using the CKD-EPI Creatinine Equation (2021)    Anion gap 14 5 - 15    Comment: Performed at [facility], [address] [place], [place] [address]  CMP (Cancer Center only)     Status: Abnormal   Collection Time: 10/21/22  1:20 PM  Result Value Ref Range   Sodium 135 135 - 145 mmol/L   Potassium 3.8 3.5 - 5.1 mmol/L   Chloride 97 (L) 98 - 111 mmol/L   CO2 28 22 - 32 mmol/L   Glucose, Bld 127 (H) 70 - 99 mg/dL    Comment: Glucose reference range applies only to samples taken after fasting for at least 8 hours.   BUN 7 (L) 8 - 23 mg/dL   Creatinine 3.26 7.12 - 1.24 mg/dL   Calcium 9.3 8.9 - 45.8 mg/dL   Total Protein 7.0 6.5 - 8.1 g/dL   Albumin 3.6 3.5 - 5.0 g/dL   AST 10 (L) 15 - 41 U/L   ALT 19 0 - 44 U/L    Alkaline Phosphatase 53 38 - 126 U/L   Total Bilirubin 0.3 0.3 - 1.2 mg/dL   GFR, Estimated >09 >98 mL/min    Comment: (NOTE) Calculated using the CKD-EPI Creatinine Equation (2021)    Anion gap 10 5 - 15    Comment: Performed at [facility], [address], [place], [place] [address]  CBC with Differential (Cancer Center Only)     Status: Abnormal   Collection Time: 10/21/22  1:20 PM  Result Value Ref Range   WBC Count 11.3 (H) 4.0 - 10.5 K/uL   RBC 4.77 4.22 - 5.81 MIL/uL   Hemoglobin 13.5 13.0 - 17.0 g/dL   HCT 05.3 97.6 - 73.4 %   MCV 83.6 80.0 - 100.0 fL   MCH 28.3 26.0 - 34.0 pg   MCHC 33.8 30.0 - 36.0 g/dL   RDW 19.3 79.0 - 24.0 %   Platelet Count 281 150 - 400 K/uL   nRBC 0.0 0.0 - 0.2 %   Neutrophils Relative % 78 %   Neutro Abs 8.8 (H) 1.7 - 7.7 K/uL   Lymphocytes Relative 15 %   Lymphs Abs 1.7 0.7 - 4.0 K/uL   Monocytes Relative 5 %   Monocytes Absolute 0.6 0.1 - 1.0 K/uL   Eosinophils Relative 1 %   Eosinophils Absolute 0.1 0.0 - 0.5 K/uL   Basophils Relative 0 %   Basophils Absolute 0.1 0.0 - 0.1 K/uL   Immature Granulocytes 1 %   Abs Immature Granulocytes 0.08 (H) 0.00 - 0.07 K/uL    Comment: Performed at [facility], [address] [place], [place] [address]     Psychiatric Specialty Exam: Physical Exam  Review of Systems  Weight 150 lb (68 kg).There is no height or weight on file to calculate BMI.  General Appearance: NA  Eye Contact:  NA  Speech:  Slow  Volume:  Decreased  Mood:  Dysphoric  Affect:  NA  Thought Process:  Descriptions of Associations: Intact  Orientation:  Full (Time, Place, and Person)  Thought Content:  Rumination  Suicidal Thoughts:  No  Homicidal Thoughts:  No  Memory:  Immediate;   Fair Recent;   Fair Remote;   Fair  Judgement:  Fair  Insight:  Shallow  Psychomotor Activity:  NA  Concentration:  Concentration: Fair and Attention Span: Fair  Recall:  [employer] of  Knowledge:  Fair  Language:  Fair  Akathisia:  No  Handed:  Right  AIMS (if indicated):     Assets:  Desire for Improvement Housing Social Support  ADL's:  Impaired  Cognition:  Impaired,  Mild  Sleep:  too much     Assessment/Plan: Paranoid schizophrenia (HCC) - Plan: benztropine (COGENTIN) 2 MG tablet, mirtazapine (REMERON) 30 MG tablet, risperiDONE (RISPERDAL) 3 MG tablet  Insomnia due to other mental disorder - Plan: mirtazapine (REMERON) 30 MG tablet  Patient is stable on his current medication.  He recently seen in the emergency room because of breathing issues.  His sister reported sometime he sleeps too much.  Reminded that he need to take the mirtazapine only when he cannot sleep.  Sister acknowledged and she will make sure not to give mirtazapine every night.  Overall she feels patient is stable.  He is not aggressive or violent.  Continue Cogentin 2 mg 2 times a day and Risperdal 3 mg 2 times a day.  We will provide a 30 tablet of mirtazapine in case he needed for insomnia.  Recommended to call us back with any question or any concern.  Follow-up in 6 months.   Follow Up Instructions:     I discussed the assessment and treatment plan with the patient. The patient was provided an opportunity to ask questions and all were answered. The patient agreed with the plan and demonstrated an understanding of the instructions.   The patient was advised to call back or seek an in-person evaluation if the symptoms worsen or if the condition fails to improve as anticipated.    Collaboration of Care: Other provider involved in patient's care AEB notes are available in epic to review  Patient/Guardian was advised Release of Information must be obtained prior to any record release in order to collaborate their care with an outside provider. Patient/Guardian was advised if they have not already done so to contact the registration department to sign all necessary forms in order for [place] to  release information regarding their care.   Consent: Patient/Guardian gives verbal consent for treatment and assignment of benefits for services provided during this visit. Patient/Guardian expressed understanding and agreed to proceed.     I provided 21 minutes of non face to face time during this encounter.  Note: This document was prepared by [employer] voice dictation technology and any errors that results from this process are unintentional.    Cleotis Nipper, MD 10/23/2022

## 2022-10-24 ENCOUNTER — Encounter: Payer: Self-pay | Admitting: Radiation Oncology

## 2022-10-24 ENCOUNTER — Ambulatory Visit
Admission: RE | Admit: 2022-10-24 | Discharge: 2022-10-24 | Disposition: A | Discharge status: Home / Self Care | Payer: Medicare Other | Source: Ambulatory Visit | Attending: Radiation Oncology | Admitting: Radiation Oncology

## 2022-10-24 ENCOUNTER — Other Ambulatory Visit: Payer: Self-pay

## 2022-10-24 VITALS — BP 137/86 | HR 85 | Temp 97.8°F | Resp 18 | Ht 66.0 in | Wt 149.0 lb

## 2022-10-24 DIAGNOSIS — C3431 Malignant neoplasm of lower lobe, right bronchus or lung: Secondary | ICD-10-CM | POA: Diagnosis not present

## 2022-10-24 DIAGNOSIS — Z9981 Dependence on supplemental oxygen: Secondary | ICD-10-CM | POA: Insufficient documentation

## 2022-10-24 DIAGNOSIS — R058 Other specified cough: Secondary | ICD-10-CM | POA: Diagnosis not present

## 2022-10-24 DIAGNOSIS — I10 Essential (primary) hypertension: Secondary | ICD-10-CM | POA: Diagnosis not present

## 2022-10-24 DIAGNOSIS — I700 Atherosclerosis of aorta: Secondary | ICD-10-CM | POA: Diagnosis not present

## 2022-10-24 DIAGNOSIS — I3139 Other pericardial effusion (noninflammatory): Secondary | ICD-10-CM | POA: Diagnosis not present

## 2022-10-24 DIAGNOSIS — E785 Hyperlipidemia, unspecified: Secondary | ICD-10-CM | POA: Diagnosis not present

## 2022-10-24 DIAGNOSIS — Z79899 Other long term (current) drug therapy: Secondary | ICD-10-CM | POA: Diagnosis not present

## 2022-10-24 DIAGNOSIS — Z7952 Long term (current) use of systemic steroids: Secondary | ICD-10-CM | POA: Insufficient documentation

## 2022-10-24 DIAGNOSIS — F209 Schizophrenia, unspecified: Secondary | ICD-10-CM | POA: Diagnosis not present

## 2022-10-24 DIAGNOSIS — Z87891 Personal history of nicotine dependence: Secondary | ICD-10-CM | POA: Diagnosis not present

## 2022-10-24 DIAGNOSIS — J439 Emphysema, unspecified: Secondary | ICD-10-CM | POA: Insufficient documentation

## 2022-10-24 DIAGNOSIS — F17210 Nicotine dependence, cigarettes, uncomplicated: Secondary | ICD-10-CM | POA: Diagnosis not present

## 2022-10-24 DIAGNOSIS — K760 Fatty (change of) liver, not elsewhere classified: Secondary | ICD-10-CM | POA: Insufficient documentation

## 2022-10-27 DIAGNOSIS — Z87891 Personal history of nicotine dependence: Secondary | ICD-10-CM | POA: Diagnosis not present

## 2022-10-27 DIAGNOSIS — C3431 Malignant neoplasm of lower lobe, right bronchus or lung: Secondary | ICD-10-CM | POA: Diagnosis not present

## 2022-11-03 ENCOUNTER — Ambulatory Visit: Payer: Medicare Other | Admitting: Radiation Oncology

## 2022-11-05 ENCOUNTER — Ambulatory Visit (INDEPENDENT_AMBULATORY_CARE_PROVIDER_SITE_OTHER): Payer: Medicare Other | Admitting: Emergency Medicine

## 2022-11-05 ENCOUNTER — Encounter: Payer: Self-pay | Admitting: Emergency Medicine

## 2022-11-05 ENCOUNTER — Institutional Professional Consult (permissible substitution): Payer: Medicare Other | Admitting: Emergency Medicine

## 2022-11-05 VITALS — BP 130/84 | HR 81 | Ht 66.0 in | Wt 149.0 lb

## 2022-11-05 DIAGNOSIS — R918 Other nonspecific abnormal finding of lung field: Secondary | ICD-10-CM | POA: Diagnosis not present

## 2022-11-05 DIAGNOSIS — J449 Chronic obstructive pulmonary disease, unspecified: Secondary | ICD-10-CM

## 2022-11-05 NOTE — Progress Notes (Signed)
Subjective:    Patient ID: Blake Mcknight, male    DOB: 1955-12-23, 67 y.o.   MRN: [id number]  HPI  ROV 07/04/22 --follow-up visit for 67 year old man with severe emphysematous COPD and associated hypoxemic respiratory failure.  We have been following him for an abnormal CT scan of the chest that has shown waxing and waning subtle pulmonary nodular disease versus scar, subcarinal lymphadenopathy.  He is quite debilitated, has not been unsure how aggressive he wants to be with regard to diagnostics or treatment of possible malignancy.  We have been following serial imaging.  His wife helps w hx. He has been doing ok. Quite debilitated. Coughs daily, some mucous production.  Currently managed on Breztri, prednisone 20 mg daily.  Uses DuoNeb or albuterol approximately  CT scan of the chest performed 06/03/2022 reviewed by me shows scattered nodular infiltrates some with calcification that are all stable to improved compared with his prior CT 02/24/2022.  He has subcarinal lymphadenopathy which is slightly decreased in size compared with December.  No new findings.  ROV 11/05/2022.  Blake Mcknight is 75 with emphysematous COPD and associated chronic hypoxemic respiratory failure, paranoid schizophrenia, hypertension.  I have seen him for his COPD and also an abnormal CT scan of the chest with subcarinal lymphadenopathy, waxing and waning pulmonary nodular disease.  He was in the emergency department 10/09/2022 and had evidence for increasing right lower lobe mass, now 5.7 cm, previously measured at 2.3 cm with some additional scattered nodular opacities (stable).  We had been managing him on Breztri, DuoNeb as needed - about every 4 hours.  Also on prednisone 20mg  daily.  His oxygen is at 2L/min.  He is having cough. Light gray mucous, no hemoptysis. He is able to get up and move through the house, but is otherwise quite limited.   CT chest 10/09/2022 reviewed by me, shows patent central airways, mild  bilateral bronchiectasis, increasing right lower lobe mass, now 5.7 x 3.7 cm (from 2.3 x 1.6 cm).  There are scattered stable right middle lobe and other nodules   Review of Systems As per HPI     Objective:   Physical Exam Vitals:   11/05/22 1311  BP: 130/84  Pulse: 81  SpO2: 100%  Weight: 149 lb (67.6 kg)  Height: 5\' 6"  (1.676 m)     Gen: Debilitated man in a wheelchair, well-nourished, in no distress, depressed affect  ENT: No lesions,  mouth clear,  oropharynx clear, no postnasal drip  Neck: No JVD, no stridor  Lungs: No use of accessory muscles, very distant, no crackles or wheezing on normal respiration, no wheeze on forced expiration  Cardiovascular: RRR, heart sounds normal, no murmur or gallops, no peripheral edema  Musculoskeletal: No deformities, no cyanosis or clubbing  Neuro: Awake, alert, will answer some simple questions.  Mild dysarthria  Skin: Warm, no lesions or rash      Assessment & Plan:  Right lower lobe lung mass I reviewed his new CT scan of the chest.  Agree that his right lower lobe mass is surrounded by emphysema and that he would not tolerate either a needle biopsy or bronchoscopy.  Given this the plan is now for him to have empiric SBRT which I think is reasonable.  Discussed this with him and his wife and they understand the plan.  He is supposed to see radiation oncology again on 8/26  COPD (chronic obstructive pulmonary disease) (HCC) Very severe COPD, quite limited.  Prednisone dependent.  It  may be reasonable as we go forward depending on his sputum burden to consider adding scheduled azithromycin.  For now we will continue same regimen.   Continue Breztri 2 puffs twice a day.  Rinse and gargle after using. Use your DuoNeb up to every 4 hours if needed for shortness of breath, chest tightness, wheezing. Continue prednisone 20 mg once daily Get the flu shot this fall.  You would also benefit from getting the COVID-19 vaccine Follow  with Blake Mcknight as planned     Levy Pupa, MD, PhD 11/05/2022, 1:25 PM [facility] [phone] or if no answer before 7:00PM call [phone] For any issues after 7:00PM please call eLink [phone]

## 2022-11-05 NOTE — Assessment & Plan Note (Signed)
I reviewed his new CT scan of the chest.  Agree that his right lower lobe mass is surrounded by emphysema and that he would not tolerate either a needle biopsy or bronchoscopy.  Given this the plan is now for him to have empiric SBRT which I think is reasonable.  Discussed this with him and his wife and they understand the plan.  He is supposed to see radiation oncology again on 8/26

## 2022-11-05 NOTE — Patient Instructions (Addendum)
We reviewed your CT scan of the chest today.  The right lower lobe opacity that we have been following is enlarged compared with prior scans concerning for possible lung cancer.  Because you have severe emphysema there is no safe test that we can perform to do a biopsy on this mass. Agree with Dr. Arbutus Ped and with Dr. [name] that it would be best to try to treat this area with targeted radiation.  Keep your appointment on 11/10/2022 as planned Continue Breztri 2 puffs twice a day.  Rinse and gargle after using. Use your DuoNeb up to every 4 hours if needed for shortness of breath, chest tightness, wheezing. Continue prednisone 20 mg once daily Get the flu shot this fall.  You would also benefit from getting the COVID-19 vaccine Wear your oxygen at 2 L/min at all times Follow with T Parrett as planned

## 2022-11-05 NOTE — Assessment & Plan Note (Signed)
Very severe COPD, quite limited.  Prednisone dependent.  It may be reasonable as we go forward depending on his sputum burden to consider adding scheduled azithromycin.  For now we will continue same regimen.   Continue Breztri 2 puffs twice a day.  Rinse and gargle after using. Use your DuoNeb up to every 4 hours if needed for shortness of breath, chest tightness, wheezing. Continue prednisone 20 mg once daily Get the flu shot this fall.  You would also benefit from getting the COVID-19 vaccine Follow with T Parrett as planned

## 2022-11-10 ENCOUNTER — Ambulatory Visit: Admission: RE | Admit: 2022-11-10 | Payer: Medicare Other | Source: Ambulatory Visit | Admitting: Radiation Oncology

## 2022-11-10 ENCOUNTER — Other Ambulatory Visit: Payer: Self-pay

## 2022-11-10 DIAGNOSIS — Z87891 Personal history of nicotine dependence: Secondary | ICD-10-CM | POA: Diagnosis not present

## 2022-11-10 DIAGNOSIS — C3431 Malignant neoplasm of lower lobe, right bronchus or lung: Secondary | ICD-10-CM | POA: Insufficient documentation

## 2022-11-11 DIAGNOSIS — R7302 Impaired glucose tolerance (oral): Secondary | ICD-10-CM | POA: Diagnosis not present

## 2022-11-11 DIAGNOSIS — M1990 Unspecified osteoarthritis, unspecified site: Secondary | ICD-10-CM | POA: Diagnosis not present

## 2022-11-11 DIAGNOSIS — E559 Vitamin D deficiency, unspecified: Secondary | ICD-10-CM | POA: Diagnosis not present

## 2022-11-11 DIAGNOSIS — F319 Bipolar disorder, unspecified: Secondary | ICD-10-CM | POA: Diagnosis not present

## 2022-11-11 DIAGNOSIS — E7800 Pure hypercholesterolemia, unspecified: Secondary | ICD-10-CM | POA: Diagnosis not present

## 2022-11-11 DIAGNOSIS — I119 Hypertensive heart disease without heart failure: Secondary | ICD-10-CM | POA: Diagnosis not present

## 2022-11-11 DIAGNOSIS — Z79899 Other long term (current) drug therapy: Secondary | ICD-10-CM | POA: Diagnosis not present

## 2022-11-11 DIAGNOSIS — N401 Enlarged prostate with lower urinary tract symptoms: Secondary | ICD-10-CM | POA: Diagnosis not present

## 2022-11-11 DIAGNOSIS — J441 Chronic obstructive pulmonary disease with (acute) exacerbation: Secondary | ICD-10-CM | POA: Diagnosis not present

## 2022-11-11 DIAGNOSIS — K2100 Gastro-esophageal reflux disease with esophagitis, without bleeding: Secondary | ICD-10-CM | POA: Diagnosis not present

## 2022-11-11 DIAGNOSIS — K5900 Constipation, unspecified: Secondary | ICD-10-CM | POA: Diagnosis not present

## 2022-11-11 DIAGNOSIS — M2550 Pain in unspecified joint: Secondary | ICD-10-CM | POA: Diagnosis not present

## 2022-11-18 ENCOUNTER — Ambulatory Visit
Admission: RE | Admit: 2022-11-18 | Discharge: 2022-11-18 | Disposition: A | Discharge status: Home / Self Care | Payer: Medicare Other | Source: Ambulatory Visit | Attending: Radiation Oncology | Admitting: Radiation Oncology

## 2022-11-18 DIAGNOSIS — Z87891 Personal history of nicotine dependence: Secondary | ICD-10-CM | POA: Diagnosis not present

## 2022-11-18 DIAGNOSIS — C3431 Malignant neoplasm of lower lobe, right bronchus or lung: Secondary | ICD-10-CM | POA: Insufficient documentation

## 2022-11-19 ENCOUNTER — Other Ambulatory Visit: Payer: Self-pay

## 2022-11-19 DIAGNOSIS — Z87891 Personal history of nicotine dependence: Secondary | ICD-10-CM | POA: Diagnosis not present

## 2022-11-19 DIAGNOSIS — C3431 Malignant neoplasm of lower lobe, right bronchus or lung: Secondary | ICD-10-CM | POA: Diagnosis not present

## 2022-11-19 DIAGNOSIS — Z510 Encounter for antineoplastic radiation therapy: Secondary | ICD-10-CM | POA: Diagnosis not present

## 2022-11-19 LAB — RAD ONC ARIA SESSION SUMMARY
Course Elapsed Days: 0
Plan Fractions Treated to Date: 1
Plan Prescribed Dose Per Fraction: 7.5 Gy
Plan Total Fractions Prescribed: 8
Plan Total Prescribed Dose: 60 Gy
Reference Point Dosage Given to Date: 7.5 Gy
Reference Point Session Dosage Given: 7.5 Gy
Session Number: 1

## 2022-11-20 ENCOUNTER — Other Ambulatory Visit: Payer: Self-pay

## 2022-11-20 ENCOUNTER — Ambulatory Visit: Payer: Medicare Other | Admitting: Radiation Oncology

## 2022-11-20 ENCOUNTER — Ambulatory Visit
Admission: RE | Admit: 2022-11-20 | Discharge: 2022-11-20 | Disposition: A | Discharge status: Home / Self Care | Payer: Medicare Other | Source: Ambulatory Visit | Attending: Radiation Oncology | Admitting: Radiation Oncology

## 2022-11-20 DIAGNOSIS — Z87891 Personal history of nicotine dependence: Secondary | ICD-10-CM | POA: Diagnosis not present

## 2022-11-20 DIAGNOSIS — C3431 Malignant neoplasm of lower lobe, right bronchus or lung: Secondary | ICD-10-CM | POA: Diagnosis not present

## 2022-11-20 DIAGNOSIS — Z510 Encounter for antineoplastic radiation therapy: Secondary | ICD-10-CM | POA: Diagnosis not present

## 2022-11-20 LAB — RAD ONC ARIA SESSION SUMMARY
Course Elapsed Days: 1
Plan Fractions Treated to Date: 2
Plan Prescribed Dose Per Fraction: 7.5 Gy
Plan Total Fractions Prescribed: 8
Plan Total Prescribed Dose: 60 Gy
Reference Point Dosage Given to Date: 15 Gy
Reference Point Session Dosage Given: 7.5 Gy
Session Number: 2

## 2022-11-21 ENCOUNTER — Other Ambulatory Visit: Payer: Self-pay

## 2022-11-21 ENCOUNTER — Ambulatory Visit
Admission: RE | Admit: 2022-11-21 | Discharge: 2022-11-21 | Disposition: A | Discharge status: Home / Self Care | Payer: Medicare Other | Source: Ambulatory Visit | Attending: Radiation Oncology | Admitting: Radiation Oncology

## 2022-11-21 ENCOUNTER — Ambulatory Visit
Admission: RE | Admit: 2022-11-21 | Discharge: 2022-11-21 | Disposition: A | Discharge status: Home / Self Care | Payer: Medicare Other | Source: Ambulatory Visit | Attending: Radiation Oncology

## 2022-11-21 ENCOUNTER — Telehealth: Payer: Self-pay | Admitting: Radiation Oncology

## 2022-11-21 DIAGNOSIS — Z510 Encounter for antineoplastic radiation therapy: Secondary | ICD-10-CM | POA: Diagnosis not present

## 2022-11-21 DIAGNOSIS — Z87891 Personal history of nicotine dependence: Secondary | ICD-10-CM | POA: Diagnosis not present

## 2022-11-21 DIAGNOSIS — C3431 Malignant neoplasm of lower lobe, right bronchus or lung: Secondary | ICD-10-CM | POA: Diagnosis not present

## 2022-11-21 LAB — RAD ONC ARIA SESSION SUMMARY
Course Elapsed Days: 2
Plan Fractions Treated to Date: 3
Plan Prescribed Dose Per Fraction: 7.5 Gy
Plan Total Fractions Prescribed: 8
Plan Total Prescribed Dose: 60 Gy
Reference Point Dosage Given to Date: 22.5 Gy
Reference Point Session Dosage Given: 7.5 Gy
Session Number: 3

## 2022-11-21 NOTE — Telephone Encounter (Signed)
9/6 @ 1:27 pm Patient's sister called to let someone know, patient is running late for his treatment appointment for today.  He is scheduled on L1 machine at 1:20 pm.  Called and spoke to [place] (Support RTT) so they are aware.

## 2022-11-24 ENCOUNTER — Other Ambulatory Visit: Payer: Self-pay

## 2022-11-24 ENCOUNTER — Ambulatory Visit
Admission: RE | Admit: 2022-11-24 | Discharge: 2022-11-24 | Disposition: A | Discharge status: Home / Self Care | Payer: Medicare Other | Source: Ambulatory Visit | Attending: Radiation Oncology | Admitting: Radiation Oncology

## 2022-11-24 DIAGNOSIS — Z87891 Personal history of nicotine dependence: Secondary | ICD-10-CM | POA: Diagnosis not present

## 2022-11-24 DIAGNOSIS — Z510 Encounter for antineoplastic radiation therapy: Secondary | ICD-10-CM | POA: Diagnosis not present

## 2022-11-24 DIAGNOSIS — C3431 Malignant neoplasm of lower lobe, right bronchus or lung: Secondary | ICD-10-CM | POA: Diagnosis not present

## 2022-11-24 DIAGNOSIS — Z23 Encounter for immunization: Secondary | ICD-10-CM | POA: Diagnosis not present

## 2022-11-24 LAB — RAD ONC ARIA SESSION SUMMARY
Course Elapsed Days: 5
Plan Fractions Treated to Date: 4
Plan Prescribed Dose Per Fraction: 7.5 Gy
Plan Total Fractions Prescribed: 8
Plan Total Prescribed Dose: 60 Gy
Reference Point Dosage Given to Date: 30 Gy
Reference Point Session Dosage Given: 7.5 Gy
Session Number: 4

## 2022-11-25 ENCOUNTER — Ambulatory Visit: Payer: Medicare Other

## 2022-11-25 ENCOUNTER — Ambulatory Visit: Payer: Medicare Other | Admitting: Radiation Oncology

## 2022-11-26 ENCOUNTER — Ambulatory Visit
Admission: RE | Admit: 2022-11-26 | Discharge: 2022-11-26 | Disposition: A | Discharge status: Home / Self Care | Payer: Medicare Other | Source: Ambulatory Visit | Attending: Radiation Oncology | Admitting: Radiation Oncology

## 2022-11-26 ENCOUNTER — Other Ambulatory Visit: Payer: Self-pay

## 2022-11-26 DIAGNOSIS — Z87891 Personal history of nicotine dependence: Secondary | ICD-10-CM | POA: Diagnosis not present

## 2022-11-26 DIAGNOSIS — C3431 Malignant neoplasm of lower lobe, right bronchus or lung: Secondary | ICD-10-CM | POA: Diagnosis not present

## 2022-11-26 DIAGNOSIS — Z510 Encounter for antineoplastic radiation therapy: Secondary | ICD-10-CM | POA: Diagnosis not present

## 2022-11-26 LAB — RAD ONC ARIA SESSION SUMMARY
Course Elapsed Days: 7
Plan Fractions Treated to Date: 5
Plan Prescribed Dose Per Fraction: 7.5 Gy
Plan Total Fractions Prescribed: 8
Plan Total Prescribed Dose: 60 Gy
Reference Point Dosage Given to Date: 37.5 Gy
Reference Point Session Dosage Given: 7.5 Gy
Session Number: 5

## 2022-11-27 ENCOUNTER — Other Ambulatory Visit: Payer: Self-pay

## 2022-11-27 ENCOUNTER — Ambulatory Visit
Admission: RE | Admit: 2022-11-27 | Discharge: 2022-11-27 | Disposition: A | Discharge status: Home / Self Care | Payer: Medicare Other | Source: Ambulatory Visit | Attending: Radiation Oncology | Admitting: Radiation Oncology

## 2022-11-27 DIAGNOSIS — Z87891 Personal history of nicotine dependence: Secondary | ICD-10-CM | POA: Diagnosis not present

## 2022-11-27 DIAGNOSIS — Z510 Encounter for antineoplastic radiation therapy: Secondary | ICD-10-CM | POA: Diagnosis not present

## 2022-11-27 DIAGNOSIS — C3431 Malignant neoplasm of lower lobe, right bronchus or lung: Secondary | ICD-10-CM | POA: Diagnosis not present

## 2022-11-27 LAB — RAD ONC ARIA SESSION SUMMARY
Course Elapsed Days: 8
Plan Fractions Treated to Date: 6
Plan Prescribed Dose Per Fraction: 7.5 Gy
Plan Total Fractions Prescribed: 8
Plan Total Prescribed Dose: 60 Gy
Reference Point Dosage Given to Date: 45 Gy
Reference Point Session Dosage Given: 7.5 Gy
Session Number: 6

## 2022-11-28 ENCOUNTER — Ambulatory Visit
Admission: RE | Admit: 2022-11-28 | Discharge: 2022-11-28 | Disposition: A | Discharge status: Home / Self Care | Payer: Medicare Other | Source: Ambulatory Visit | Attending: Radiation Oncology

## 2022-11-28 ENCOUNTER — Ambulatory Visit: Payer: Medicare Other

## 2022-11-28 ENCOUNTER — Ambulatory Visit
Admission: RE | Admit: 2022-11-28 | Discharge: 2022-11-28 | Disposition: A | Discharge status: Home / Self Care | Payer: Medicare Other | Source: Ambulatory Visit | Attending: Radiation Oncology | Admitting: Radiation Oncology

## 2022-11-28 ENCOUNTER — Other Ambulatory Visit: Payer: Self-pay

## 2022-11-28 DIAGNOSIS — Z510 Encounter for antineoplastic radiation therapy: Secondary | ICD-10-CM | POA: Diagnosis not present

## 2022-11-28 DIAGNOSIS — C3431 Malignant neoplasm of lower lobe, right bronchus or lung: Secondary | ICD-10-CM | POA: Diagnosis not present

## 2022-11-28 DIAGNOSIS — Z87891 Personal history of nicotine dependence: Secondary | ICD-10-CM | POA: Diagnosis not present

## 2022-11-28 LAB — RAD ONC ARIA SESSION SUMMARY
Course Elapsed Days: 9
Plan Fractions Treated to Date: 7
Plan Prescribed Dose Per Fraction: 7.5 Gy
Plan Total Fractions Prescribed: 8
Plan Total Prescribed Dose: 60 Gy
Reference Point Dosage Given to Date: 52.5 Gy
Reference Point Session Dosage Given: 7.5 Gy
Session Number: 7

## 2022-12-01 ENCOUNTER — Other Ambulatory Visit: Payer: Self-pay

## 2022-12-01 ENCOUNTER — Ambulatory Visit
Admission: RE | Admit: 2022-12-01 | Discharge: 2022-12-01 | Disposition: A | Discharge status: Home / Self Care | Payer: Medicare Other | Source: Ambulatory Visit | Attending: Radiation Oncology | Admitting: Radiation Oncology

## 2022-12-01 DIAGNOSIS — Z510 Encounter for antineoplastic radiation therapy: Secondary | ICD-10-CM | POA: Diagnosis not present

## 2022-12-01 DIAGNOSIS — Z87891 Personal history of nicotine dependence: Secondary | ICD-10-CM | POA: Diagnosis not present

## 2022-12-01 DIAGNOSIS — C3431 Malignant neoplasm of lower lobe, right bronchus or lung: Secondary | ICD-10-CM | POA: Diagnosis not present

## 2022-12-01 LAB — RAD ONC ARIA SESSION SUMMARY
Course Elapsed Days: 12
Plan Fractions Treated to Date: 8
Plan Prescribed Dose Per Fraction: 7.5 Gy
Plan Total Fractions Prescribed: 8
Plan Total Prescribed Dose: 60 Gy
Reference Point Dosage Given to Date: 60 Gy
Reference Point Session Dosage Given: 7.5 Gy
Session Number: 8

## 2022-12-03 NOTE — Radiation Completion Notes (Addendum)
Radiation Oncology         [phone] ________________________________  Name: ARBA WOOTAN MRN: [id number]  Date of Service: 12/01/2022  DOB: 1956/01/12  End of Treatment Note   Diagnosis:  Putative Stage IIB, cT3N0M0, NSCLC of the RLL   Intent: Curative     ==========DELIVERED PLANS==========  First Treatment Date: 2022-11-19 - Last Treatment Date: 2022-12-01   Plan Name: [id number] Site: Lung, Right Technique: IMRT Mode: Photon Dose Per Fraction: 7.5 Gy Prescribed Dose (Delivered / Prescribed): 60 Gy / 60 Gy Prescribed Fxs (Delivered / Prescribed): 8 / 8     ==========ON TREATMENT VISIT DATES========== 2022-11-21, 2022-11-28    See weekly On Treatment Notes in Epic for details.   The patient tolerated radiation. He developed fatigue during therapy.  The patient will receive a call in about one month from the radiation oncology department. He will continue follow up with Dr. Arbutus Ped as well.      Osker Mason, PAC

## 2023-01-05 ENCOUNTER — Encounter: Payer: Self-pay | Admitting: Adult Health

## 2023-01-05 ENCOUNTER — Ambulatory Visit (INDEPENDENT_AMBULATORY_CARE_PROVIDER_SITE_OTHER): Payer: Medicare Other | Admitting: Adult Health

## 2023-01-05 VITALS — BP 106/76 | HR 109 | Temp 98.1°F | Ht 66.0 in | Wt 163.0 lb

## 2023-01-05 DIAGNOSIS — J441 Chronic obstructive pulmonary disease with (acute) exacerbation: Secondary | ICD-10-CM | POA: Diagnosis not present

## 2023-01-05 DIAGNOSIS — C3431 Malignant neoplasm of lower lobe, right bronchus or lung: Secondary | ICD-10-CM | POA: Diagnosis not present

## 2023-01-05 DIAGNOSIS — J9611 Chronic respiratory failure with hypoxia: Secondary | ICD-10-CM | POA: Diagnosis not present

## 2023-01-05 NOTE — Assessment & Plan Note (Signed)
Large right lower lobe lung mass presumed underlying lung cancer.  Patient is not a candidate for tissue sampling or surgery Underwent targeted radiation therapy. Will repeat CT chest January 2024  Plan  Patient Instructions  Continue on Breztri 2 puffs twice daily, rinse after use Albuterol inhaler or DuoNeb nebulizer as needed Activity as tolerated Continue on Oxygen 2l/m  Finish antibiotics as planned  Mucinex DM Twice daily  As needed   CT Chest in 3 months Follow up with Dr. Delton Coombes  in 3 months after CT chest

## 2023-01-05 NOTE — Assessment & Plan Note (Signed)
Continue on oxygen to maintain O2 saturations greater than 88 to 9% 

## 2023-01-05 NOTE — Assessment & Plan Note (Signed)
COPD exacerbation currently improving on antibiotic therapy.  May use Mucinex as needed.  Continue on Breztri twice daily.  Albuterol or DuoNeb nebulizer as needed.  Plan  Patient Instructions  Continue on Breztri 2 puffs twice daily, rinse after use Albuterol inhaler or DuoNeb nebulizer as needed Activity as tolerated Continue on Oxygen 2l/m  Finish antibiotics as planned  Mucinex DM Twice daily  As needed   CT Chest in 3 months Follow up with Dr. Delton Coombes  in 3 months after CT chest

## 2023-01-05 NOTE — Patient Instructions (Addendum)
Continue on Breztri 2 puffs twice daily, rinse after use Albuterol inhaler or DuoNeb nebulizer as needed Activity as tolerated Continue on Oxygen 2l/m  Finish antibiotics as planned  Mucinex DM Twice daily  As needed   CT Chest in 3 months Follow up with Dr. Delton Coombes  in 3 months after CT chest

## 2023-01-05 NOTE — Progress Notes (Signed)
@Patient  ID: Blake Mcknight, male    DOB: 1955/06/17, 67 y.o.   MRN: [id number]  Chief Complaint  Patient presents with   Follow-up    Referring provider: Corine Shelter, MD  HPI: 67 year old male followed for COPD with emphysema and right lower lobe lung mass presumed lung cancer status post empiric SBRT and chronic respiratory failure on oxygen Medical history difficult for schizophrenia.  TEST/EVENTS :  CT chest October 09, 2022 right lower lobe lung mass measuring 5.7 cm previously 2.3 cm, stable enlarged subcarinal lymph node, stable scattered nodular opacities, emphysema  01/05/2023 Follow up : COPD , RLL lung mass /Presumed Lung cancer s/p SBRT, O2 RF Patient presents for a 43-month follow-up patient has a severe COPD with emphysema.  He has significant symptom burden with shortness of breath at rest.  He remains on Breztri inhaler twice daily.  Uses albuterol or DuoNeb as needed.  Complains of some increased cough and congestion over the last couple weeks.  Patient seen by primary care provider and given antibiotic.  Symptoms seem to be improving.  Denies any hemoptysis or fever.  No chest pain orthopnea or edema.    Patient has presumed right lower lobe lung cancer.  CT chest October 09, 2022 showed enlarging right lower lobe lung mass measuring up to 5.7 cm.  Previously measured at 2.3 cm.  Patient was not a good surgical candidate for tissue sampling or resection.  He was referred to radiation oncology for targeted SBRT.  Patient has completed his treatment course with radiation oncology. Caused significant fatigue. Starting to get some better. Appetite is picking back up.       No Known Allergies  Immunization History  Administered Date(s) Administered   Fluad Trivalent(High Dose 65+) 12/06/2022   Influenza Split 12/15/2012   Influenza Whole 11/18/2021   Influenza,inj,Quad PF,6+ Mos 01/21/2018   PFIZER Comirnaty(Gray Top)Covid-19 Tri-Sucrose Vaccine 08/04/2020    Pneumococcal Polysaccharide-23 06/08/2013    Past Medical History:  Diagnosis Date   COPD (chronic obstructive pulmonary disease) (HCC)    with bullous emphysema.    Heavy cigarette smoker before 2003   pt claims only 10 cigs per day, never heavier amounts.    HLD (hyperlipidemia)    Hypertension    Schizophrenia (HCC) 06/28/2013   This is a chronic condition and he lives with family    Tobacco History: Social History   Tobacco Use  Smoking Status Former   Current packs/day: 1.00   Average packs/day: 1 pack/day for 41.0 years (41.0 ttl pk-yrs)   Types: Cigarettes  Smokeless Tobacco Never  Tobacco Comments   Quit 7 - 8 months ago [facility] 07/17/21   Counseling given: Not Answered Tobacco comments: Quit 7 - 8 months ago [facility] 07/17/21   Outpatient Medications Prior to Visit  Medication Sig Dispense Refill   albuterol (VENTOLIN HFA) 108 (90 Base) MCG/ACT inhaler Inhale 2 puffs into the lungs every 4 (four) hours as needed for wheezing or shortness of breath.     albuterol (VENTOLIN HFA) 108 (90 Base) MCG/ACT inhaler Inhale 2 puffs into the lungs every 4 (four) hours as needed for wheezing or shortness of breath. 1 each 1   amLODipine (NORVASC) 10 MG tablet Take 10 mg by mouth daily.     atorvastatin (LIPITOR) 10 MG tablet Take 10 mg by mouth at bedtime.     azithromycin (ZITHROMAX) 250 MG tablet Take 1 tablet (250 mg total) by mouth daily. Take first 2 tablets together, then 1 every day  until finished. 6 tablet 0   benzonatate (TESSALON) 200 MG capsule Take 1 capsule (200 mg total) by mouth 2 (two) times daily as needed for cough. 20 capsule 0   benztropine (COGENTIN) 2 MG tablet Take 1 tablet (2 mg total) by mouth 2 (two) times daily. 180 tablet 1   BREZTRI AEROSPHERE 160-9-4.8 MCG/ACT AERO Inhale 2 puffs into the lungs in the morning and at bedtime.     carvedilol (COREG) 3.125 MG tablet Take 1 tablet (3.125 mg total) by mouth 2 (two) times daily with a meal. (Patient taking differently:  Take 1.5625-3.125 mg by mouth See admin instructions. Take one tablet by mouth daily in the morning and one-half tablet by mouth daily in the evening.)  0   castor oil liquid Take 10 mLs by mouth daily as needed for moderate constipation or mild constipation.     cetirizine (ZYRTEC) 10 MG tablet Take 10 mg by mouth daily as needed for allergies or rhinitis.     cholecalciferol (VITAMIN D) 25 MCG (1000 UNIT) tablet Take 4,000 Units by mouth every evening.      famotidine (PEPCID) 40 MG tablet Take 40 mg by mouth 2 (two) times daily.     HYDROCODONE-CHLORPHENIRAMINE PO Take 5 mLs by mouth every 6 (six) hours as needed (cough and pain).     ipratropium-albuterol (DUONEB) 0.5-2.5 (3) MG/3ML SOLN Take 3 mLs by nebulization every 4 (four) hours.     MILK THISTLE PO Take 1 capsule by mouth daily.     mirtazapine (REMERON) 30 MG tablet Take 1 tablet (30 mg total) by mouth at bedtime as needed (insomnia). 30 tablet 0   multivitamin (ONE-A-DAY MEN'S) TABS tablet Take 1 tablet by mouth daily.     OVER THE COUNTER MEDICATION Take 2-4 tablets by mouth daily as needed (constipation). Oxy powder     OXYGEN Inhale 2 L/min into the lungs continuous.      polyethylene glycol (MIRALAX / GLYCOLAX) 17 g packet Take 17 g by mouth daily as needed for mild constipation.     risperiDONE (RISPERDAL) 3 MG tablet Take 1 tablet (3 mg total) by mouth 2 (two) times daily. 180 tablet 1   sodium chloride (OCEAN) 0.65 % SOLN nasal spray Place 1 spray into both nostrils as needed for congestion.     tamsulosin (FLOMAX) 0.4 MG CAPS capsule Take 0.8 mg by mouth at bedtime.     No facility-administered medications prior to visit.     Review of Systems:   Constitutional:   No  weight loss, night sweats,  Fevers, chills, +fatigue, or  lassitude.  HEENT:   No headaches,  Difficulty swallowing,  Tooth/dental problems, or  Sore throat,                No sneezing, itching, ear ache, nasal congestion, post nasal drip,   CV:  No  chest pain,  Orthopnea, PND, swelling in lower extremities, anasarca, dizziness, palpitations, syncope.   GI  No heartburn, indigestion, abdominal pain, nausea, vomiting, diarrhea, change in bowel habits, loss of appetite, bloody stools.   Resp: .  No chest wall deformity  Skin: no rash or lesions.  GU: no dysuria, change in color of urine, no urgency or frequency.  No flank pain, no hematuria   MS:  No joint pain or swelling.  No decreased range of motion.  No back pain.    Physical Exam  BP 106/76 (BP Location: Right Arm, Cuff Size: Normal)   Pulse Marland Kitchen)  109   Temp 98.1 F (36.7 C) (Oral)   Ht 5\' 6"  (1.676 m)   Wt 163 lb (73.9 kg)   SpO2 98%   BMI 26.31 kg/m   GEN: A/Ox3; pleasant , NAD, frail , in WC on O2    HEENT:  /AT,  NOSE-clear, THROAT-clear, no lesions, no postnasal drip or exudate noted.   NECK:  Supple w/ fair ROM; no JVD; normal carotid impulses w/o bruits; no thyromegaly or nodules palpated; no lymphadenopathy.    RESP  Clear  P & A; w/o, wheezes/ rales/ or rhonchi. no accessory muscle use, no dullness to percussion  CARD:  RRR, no m/r/g, no peripheral edema, pulses intact, no cyanosis or clubbing.  GI:   Soft & nt; nml bowel sounds; no organomegaly or masses detected.   Musco: Warm bil, no deformities or joint swelling noted.   Neuro: alert, no focal deficits noted.    Skin: Warm, no lesions or rashes    Lab Results:  CBC    BNP   ProBNP No results found for: "PROBNP"  Imaging: No results found.  Administration History     None          Latest Ref Rng & Units 07/26/2013   10:51 AM  PFT Results  FVC-Pre L 1.95  P  FVC-Predicted Pre % 53  P  FVC-Post L 2.02  P  FVC-Predicted Post % 55  P  Pre FEV1/FVC % % 53  P  Post FEV1/FCV % % 52  P  FEV1-Pre L 1.03  P  FEV1-Predicted Pre % 35  P  FEV1-Post L 1.05  P    P Preliminary result    No results found for: "NITRICOXIDE"      Assessment & Plan:   COPD (chronic  obstructive pulmonary disease) (HCC) COPD exacerbation currently improving on antibiotic therapy.  May use Mucinex as needed.  Continue on Breztri twice daily.  Albuterol or DuoNeb nebulizer as needed.  Plan  Patient Instructions  Continue on Breztri 2 puffs twice daily, rinse after use Albuterol inhaler or DuoNeb nebulizer as needed Activity as tolerated Continue on Oxygen 2l/m  Finish antibiotics as planned  Mucinex DM Twice daily  As needed   CT Chest in 3 months Follow up with Dr. Delton Coombes  in 3 months after CT chest       Chronic respiratory failure with hypoxia (HCC) Continue on oxygen to maintain O2 saturations greater than 88 to 9%  Malignant neoplasm of lower lobe of right lung (HCC) Large right lower lobe lung mass presumed underlying lung cancer.  Patient is not a candidate for tissue sampling or surgery Underwent targeted radiation therapy. Will repeat CT chest January 2024  Plan  Patient Instructions  Continue on Breztri 2 puffs twice daily, rinse after use Albuterol inhaler or DuoNeb nebulizer as needed Activity as tolerated Continue on Oxygen 2l/m  Finish antibiotics as planned  Mucinex DM Twice daily  As needed   CT Chest in 3 months Follow up with Dr. Delton Coombes  in 3 months after CT chest         Rubye Oaks, NP 01/05/2023

## 2023-01-29 DIAGNOSIS — Z79899 Other long term (current) drug therapy: Secondary | ICD-10-CM | POA: Diagnosis not present

## 2023-01-29 DIAGNOSIS — K5900 Constipation, unspecified: Secondary | ICD-10-CM | POA: Diagnosis not present

## 2023-01-29 DIAGNOSIS — E118 Type 2 diabetes mellitus with unspecified complications: Secondary | ICD-10-CM | POA: Diagnosis not present

## 2023-01-29 DIAGNOSIS — E7800 Pure hypercholesterolemia, unspecified: Secondary | ICD-10-CM | POA: Diagnosis not present

## 2023-01-29 DIAGNOSIS — N401 Enlarged prostate with lower urinary tract symptoms: Secondary | ICD-10-CM | POA: Diagnosis not present

## 2023-01-29 DIAGNOSIS — J441 Chronic obstructive pulmonary disease with (acute) exacerbation: Secondary | ICD-10-CM | POA: Diagnosis not present

## 2023-01-29 DIAGNOSIS — F319 Bipolar disorder, unspecified: Secondary | ICD-10-CM | POA: Diagnosis not present

## 2023-01-29 DIAGNOSIS — K2100 Gastro-esophageal reflux disease with esophagitis, without bleeding: Secondary | ICD-10-CM | POA: Diagnosis not present

## 2023-01-29 DIAGNOSIS — M2550 Pain in unspecified joint: Secondary | ICD-10-CM | POA: Diagnosis not present

## 2023-01-29 DIAGNOSIS — M1990 Unspecified osteoarthritis, unspecified site: Secondary | ICD-10-CM | POA: Diagnosis not present

## 2023-01-29 DIAGNOSIS — E559 Vitamin D deficiency, unspecified: Secondary | ICD-10-CM | POA: Diagnosis not present

## 2023-01-29 DIAGNOSIS — I119 Hypertensive heart disease without heart failure: Secondary | ICD-10-CM | POA: Diagnosis not present

## 2023-03-23 ENCOUNTER — Telehealth (HOSPITAL_COMMUNITY): Payer: Self-pay | Admitting: *Deleted

## 2023-03-23 NOTE — Telephone Encounter (Signed)
 PA for Benztropine Mesylate 2 mg tabs BID submitted to [facility] and has been Approved.  Dates of coverage are 03/18/23 through 03/22/2024.

## 2023-03-27 ENCOUNTER — Telehealth (HOSPITAL_BASED_OUTPATIENT_CLINIC_OR_DEPARTMENT_OTHER): Payer: Medicare Other | Admitting: Psychiatry

## 2023-03-27 ENCOUNTER — Encounter (HOSPITAL_COMMUNITY): Payer: Self-pay | Admitting: Psychiatry

## 2023-03-27 VITALS — Wt 163.0 lb

## 2023-03-27 DIAGNOSIS — F200 Paranoid schizophrenia: Secondary | ICD-10-CM

## 2023-03-27 DIAGNOSIS — F411 Generalized anxiety disorder: Secondary | ICD-10-CM | POA: Diagnosis not present

## 2023-03-27 DIAGNOSIS — F99 Mental disorder, not otherwise specified: Secondary | ICD-10-CM

## 2023-03-27 DIAGNOSIS — F5105 Insomnia due to other mental disorder: Secondary | ICD-10-CM

## 2023-03-27 MED ORDER — RISPERIDONE 3 MG PO TABS
3.0000 mg | ORAL_TABLET | Freq: Two times a day (BID) | ORAL | 1 refills | Status: DC
Start: 2023-03-27 — End: 2023-07-30

## 2023-03-27 MED ORDER — BENZTROPINE MESYLATE 2 MG PO TABS
2.0000 mg | ORAL_TABLET | Freq: Two times a day (BID) | ORAL | 1 refills | Status: DC
Start: 2023-03-27 — End: 2023-07-30

## 2023-03-27 MED ORDER — MIRTAZAPINE 30 MG PO TABS
30.0000 mg | ORAL_TABLET | Freq: Every evening | ORAL | 1 refills | Status: DC | PRN
Start: 2023-03-27 — End: 2023-03-27

## 2023-03-27 MED ORDER — MIRTAZAPINE 30 MG PO TABS: 30.0000 mg | ORAL_TABLET | Freq: Every evening | ORAL | 1 refills | Status: DC | PRN

## 2023-03-27 NOTE — Progress Notes (Signed)
 [facility] MD Virtual Progress Note   Patient Location: Home Provider Location: Home Office  I connect with patient by telephone and verified that I am speaking with correct person by using two identifiers. I discussed the limitations of evaluation and management by telemedicine and the availability of in person appointments. I also discussed with the patient that there may be a patient responsible charge related to this service. The patient expressed understanding and agreed to proceed.  Blake Mcknight [id number] 68 y.o.  03/27/2023 10:09 AM  History of Present Illness:  Patient is evaluated by phone session.  Patient is a poor historian and his sister helped during session.  Patient is currently getting breathing treatment after have episode of shortness of breath.  Patient has chronic health issues.  Patient reported he is sleeping fair because of breathing issues but denies any hallucination or any paranoia.  As per sister patient does talk to himself but not agitated, aggressive, violent.  He is no longer taking any narcotic medicine and sometime complaining of pain in his body.  Sister believes the current medicine is working and keeping him stable.  He does not get crying spells, agitation.  His appetite is fair.  He has memory issues and sometimes he does not remember very well.  His sister helps for medication pillbox.  Patient does not drive.  We also have limited ADLs and require help.  He has tremors but stable.  We have provided mirtazapine  that is helping his sleep and anxiety.  He is taking it now every day.  He is taking Cogentin  2 mg twice a day to help his tremors.  We have discussion in the past about anticholinergic side effects but so far patient tolerating well and sister does not want to reduce the dose.  Past Psychiatric History: H/O schizophrenia and admitted at [facility] 25 years ago. H/O paranoid, delusional, disorganized behavior and having mood  swings.    Outpatient Encounter Medications as of 03/27/2023  Medication Sig   albuterol  (VENTOLIN  HFA) 108 (90 Base) MCG/ACT inhaler Inhale 2 puffs into the lungs every 4 (four) hours as needed for wheezing or shortness of breath.   albuterol  (VENTOLIN  HFA) 108 (90 Base) MCG/ACT inhaler Inhale 2 puffs into the lungs every 4 (four) hours as needed for wheezing or shortness of breath.   amLODipine  (NORVASC ) 10 MG tablet Take 10 mg by mouth daily.   atorvastatin  (LIPITOR) 10 MG tablet Take 10 mg by mouth at bedtime.   azithromycin  (ZITHROMAX ) 250 MG tablet Take 1 tablet (250 mg total) by mouth daily. Take first 2 tablets together, then 1 every day until finished.   benzonatate  (TESSALON ) 200 MG capsule Take 1 capsule (200 mg total) by mouth 2 (two) times daily as needed for cough.   benztropine  (COGENTIN ) 2 MG tablet Take 1 tablet (2 mg total) by mouth 2 (two) times daily.   BREZTRI  AEROSPHERE 160-9-4.8 MCG/ACT AERO Inhale 2 puffs into the lungs in the morning and at bedtime.   carvedilol  (COREG ) 3.125 MG tablet Take 1 tablet (3.125 mg total) by mouth 2 (two) times daily with a meal. (Patient taking differently: Take 1.5625-3.125 mg by mouth See admin instructions. Take one tablet by mouth daily in the morning and one-half tablet by mouth daily in the evening.)   castor oil liquid Take 10 mLs by mouth daily as needed for moderate constipation or mild constipation.   cetirizine (ZYRTEC) 10 MG tablet Take 10 mg by mouth daily as needed  for allergies or rhinitis.   cholecalciferol  (VITAMIN D ) 25 MCG (1000 UNIT) tablet Take 4,000 Units by mouth every evening.    famotidine  (PEPCID ) 40 MG tablet Take 40 mg by mouth 2 (two) times daily.   HYDROCODONE -CHLORPHENIRAMINE PO Take 5 mLs by mouth every 6 (six) hours as needed (cough and pain).   ipratropium-albuterol  (DUONEB) 0.5-2.5 (3) MG/3ML SOLN Take 3 mLs by nebulization every 4 (four) hours.   MILK THISTLE PO Take 1 capsule by mouth daily.   mirtazapine   (REMERON ) 30 MG tablet Take 1 tablet (30 mg total) by mouth at bedtime as needed (insomnia).   multivitamin (ONE-A-DAY MEN'S) TABS tablet Take 1 tablet by mouth daily.   OVER THE COUNTER MEDICATION Take 2-4 tablets by mouth daily as needed (constipation). Oxy powder   OXYGEN  Inhale 2 L/min into the lungs continuous.    polyethylene glycol (MIRALAX  / GLYCOLAX ) 17 g packet Take 17 g by mouth daily as needed for mild constipation.   risperiDONE  (RISPERDAL ) 3 MG tablet Take 1 tablet (3 mg total) by mouth 2 (two) times daily.   sodium chloride  (OCEAN) 0.65 % SOLN nasal spray Place 1 spray into both nostrils as needed for congestion.   tamsulosin  (FLOMAX ) 0.4 MG CAPS capsule Take 0.8 mg by mouth at bedtime.   No facility-administered encounter medications on file as of 03/27/2023.    No results found for this or any previous visit (from the past 2160 hours).   Psychiatric Specialty Exam: Physical Exam  Review of Systems  Respiratory:  Positive for shortness of breath.   Musculoskeletal:        Chronic pain  Psychiatric/Behavioral:  Positive for decreased concentration.     Weight 163 lb (73.9 kg).There is no height or weight on file to calculate BMI.  General Appearance: NA  Eye Contact:  NA  Speech:  Slow  Volume:  Decreased  Mood:  Dysphoric  Affect:  Restricted  Thought Process:  Descriptions of Associations: Intact  Orientation:  Full (Time, Place, and Person)  Thought Content:  Rumination  Suicidal Thoughts:  No  Homicidal Thoughts:  No  Memory:  Immediate;   Fair Recent;   Fair Remote;   Fair  Judgement:  Fair  Insight:  Shallow  Psychomotor Activity:  Tremor  Concentration:  Concentration: Fair and Attention Span: Fair  Recall:  [employer] of Knowledge:  Fair  Language:  Fair  Akathisia:  No  Handed:  Right  AIMS (if indicated):     Assets:  Engineer, Maintenance Social Support  ADL's:  Impaired  Cognition:  Impaired,  Mild  Sleep:  fair      Assessment/Plan: Paranoid schizophrenia (HCC) - Plan: benztropine  (COGENTIN ) 2 MG tablet, risperiDONE  (RISPERDAL ) 3 MG tablet, mirtazapine  (REMERON ) 30 MG tablet, DISCONTINUED: mirtazapine  (REMERON ) 30 MG tablet  Insomnia due to other mental disorder - Plan: mirtazapine  (REMERON ) 30 MG tablet, DISCONTINUED: mirtazapine  (REMERON ) 30 MG tablet  Generalized anxiety disorder - Plan: mirtazapine  (REMERON ) 30 MG tablet  Patient is now taking mirtazapine  every night which is helping his anxiety and sleep.  Discussed chronic health issues and encouraged to keep appointment with primary care.  Patient sister does not want to reduce the medication since it is working and patient does not exhibit any aggression, violence, paranoia.  He does talk to himself but usually redirects.  Continue Risperdal  3 mg 2 times a day, Cogentin  2 mg 2 times a day and we will continue mirtazapine  30 mg to take every night.  We will follow-up in 6 months in person.  Encouraged to call us  back if is any question or any concern.     Follow Up Instructions:     I discussed the assessment and treatment plan with the patient. The patient was provided an opportunity to ask questions and all were answered. The patient agreed with the plan and demonstrated an understanding of the instructions.   The patient was advised to call back or seek an in-person evaluation if the symptoms worsen or if the condition fails to improve as anticipated.    Collaboration of Care: Other provider involved in patient's care AEB notes are available in epic to review  Patient/Guardian was advised Release of Information must be obtained prior to any record release in order to collaborate their care with an outside provider. Patient/Guardian was advised if they have not already done so to contact the registration department to sign all necessary forms in order for us  to release information regarding their care.   Consent: Patient/Guardian gives verbal  consent for treatment and assignment of benefits for services provided during this visit. Patient/Guardian expressed understanding and agreed to proceed.     I provided 24 minutes of non face to face time during this encounter.  Note: This document was prepared by [employer] voice dictation technology and any errors that results from this process are unintentional.    Leni ONEIDA Client, MD 03/27/2023

## 2023-03-31 ENCOUNTER — Ambulatory Visit (HOSPITAL_COMMUNITY): Payer: Medicare Other

## 2023-03-31 ENCOUNTER — Ambulatory Visit (HOSPITAL_COMMUNITY)
Admission: RE | Admit: 2023-03-31 | Discharge: 2023-03-31 | Disposition: A | Discharge status: Home / Self Care | Payer: Medicare Other | Source: Ambulatory Visit | Attending: Adult Health | Admitting: Adult Health

## 2023-03-31 DIAGNOSIS — C3431 Malignant neoplasm of lower lobe, right bronchus or lung: Secondary | ICD-10-CM | POA: Diagnosis not present

## 2023-03-31 DIAGNOSIS — R918 Other nonspecific abnormal finding of lung field: Secondary | ICD-10-CM | POA: Diagnosis not present

## 2023-03-31 DIAGNOSIS — J9610 Chronic respiratory failure, unspecified whether with hypoxia or hypercapnia: Secondary | ICD-10-CM | POA: Diagnosis not present

## 2023-03-31 DIAGNOSIS — J439 Emphysema, unspecified: Secondary | ICD-10-CM | POA: Diagnosis not present

## 2023-04-10 ENCOUNTER — Telehealth: Payer: Self-pay | Admitting: *Deleted

## 2023-04-10 NOTE — Telephone Encounter (Signed)
Called med/onc scheduling and lvm on Vanessa Barrow's vm to schedule fu with Dr. Arbutus Ped

## 2023-04-14 ENCOUNTER — Encounter: Payer: Self-pay | Admitting: Adult Health

## 2023-04-14 ENCOUNTER — Ambulatory Visit (INDEPENDENT_AMBULATORY_CARE_PROVIDER_SITE_OTHER): Payer: Medicare Other | Admitting: Adult Health

## 2023-04-14 VITALS — BP 102/66 | HR 96 | Ht 66.0 in | Wt 149.0 lb

## 2023-04-14 DIAGNOSIS — J441 Chronic obstructive pulmonary disease with (acute) exacerbation: Secondary | ICD-10-CM

## 2023-04-14 DIAGNOSIS — J9611 Chronic respiratory failure with hypoxia: Secondary | ICD-10-CM

## 2023-04-14 DIAGNOSIS — C3431 Malignant neoplasm of lower lobe, right bronchus or lung: Secondary | ICD-10-CM

## 2023-04-14 DIAGNOSIS — R918 Other nonspecific abnormal finding of lung field: Secondary | ICD-10-CM

## 2023-04-14 NOTE — Assessment & Plan Note (Signed)
Stable on oxygen 2 L.

## 2023-04-14 NOTE — Assessment & Plan Note (Signed)
Severe COPD-currently with an exacerbation plus or minus pneumonia-clinically improving on antibiotic therapy. Continue therapy maintenance inhaler with Breztri.  Albuterol and DuoNeb as needed.  Add in flutter valve twice daily.   Plan  Patient Instructions  Finish Doxycycline as directed.  Continue on Breztri 2 puffs twice daily, rinse after use Albuterol inhaler or DuoNeb nebulizer as needed Add Flutter valve Twice daily  As needed  cough/congestion  High protein diet.  Activity as tolerated Continue on Oxygen 2l/m  Mucinex DM Twice daily  As needed   CT chest 3 months at [facility]  Follow up with Dr. Delton Coombes  in 3 months and As needed   Please contact office for sooner follow up if symptoms do not improve or worsen or seek emergency care

## 2023-04-14 NOTE — Assessment & Plan Note (Signed)
Large right lower lobe lung mass presumed underlying malignancy.  Status post SBRT.  Follow-up serial imaging shows decreased right lower lobe lung mass at 4.4 cm previously at 5.7 cm.  Along with XRT changes.  Increased right lower lobe nodule and right hilar lymph node.  Stable lingula nodule.  Case discussed with radiation oncology.  Will have close serial follow-up with CT chest in 3 months.  Patient has severe underlying emphysema, decreased lung function, oxygen dependent.  Concern for additional radiation risk would outweigh benefit.  Plan  Patient Instructions  Finish Doxycycline as directed.  Continue on Breztri 2 puffs twice daily, rinse after use Albuterol inhaler or DuoNeb nebulizer as needed Add Flutter valve Twice daily  As needed  cough/congestion  High protein diet.  Activity as tolerated Continue on Oxygen 2l/m  Mucinex DM Twice daily  As needed   CT chest 3 months at [facility]  Follow up with Dr. Delton Coombes  in 3 months and As needed   Please contact office for sooner follow up if symptoms do not improve or worsen or seek emergency care

## 2023-04-14 NOTE — Patient Instructions (Addendum)
Finish Doxycycline as directed.  Continue on Breztri 2 puffs twice daily, rinse after use Albuterol inhaler or DuoNeb nebulizer as needed Add Flutter valve Twice daily  As needed  cough/congestion  High protein diet.  Activity as tolerated Continue on Oxygen 2l/m  Mucinex DM Twice daily  As needed   CT chest 3 months at [facility]  Follow up with Dr. Delton Coombes  in 3 months and As needed   Please contact office for sooner follow up if symptoms do not improve or worsen or seek emergency care

## 2023-04-14 NOTE — Progress Notes (Signed)
@Patient  ID: Blake Mcknight, male    DOB: 1955-12-30, 68 y.o.   MRN: [id number]  Chief Complaint  Patient presents with   Follow-up    Referring provider: Corine Shelter, MD  HPI: 68 year old male followed for COPD with emphysema and right lower lobe lung mass presumed lung cancer status post empiric SBRT, chronic respiratory failure on oxygen Medical history significant for schizophrenia  TEST/EVENTS :  CT chest October 09, 2022 right lower lobe lung mass measuring 5.7 cm previously 2.3 cm, stable enlarged subcarinal lymph node, stable scattered nodular opacities, emphysema   PFTs Jul 26, 2013 FEV1 at 36% ratio 52, FVC 55%, no significant bronchodilator response  04/14/2023 Follow up ; COPD, RLL lung mass/presumed lung cancer status post SBRT, chronic respiratory failure Patient presents for a 76-month follow-up.  Patient is followed for severe COPD with emphysema.  He has significant symptom burden.  Has shortness of breath at rest.  Remains on Breztri inhaler twice daily.  Patient is accompanied by his wife for today's visit.  Patient complains that breathing has not been doing as well in the last couple weeks.  Had increased cough, congestion, thick mucus.  Was seen by primary care and started on doxycycline.  Has 3 additional days left.  Is starting to feel better yesterday.  Denies any fever, hemoptysis, weight loss.  As above has a presumed right lower lobe lung cancer.  CT chest October 09, 2022 showed an enlarging right lower lobe lung mass measuring up to 5.7 cm.  Was felt not to be a good surgical candidate for tissue sampling or resection.  He was evaluated by radiation oncology and underwent targeted SBRT.  CT chest March 31, 2023 showed treated right lower lobe mass decreased in size at 4.4 cm previously at 5.7.  Right lower lobe consolidation with air bronchograms, right middle lobe opacity appears less confluent today.  Stable lingular nodule, increased right lower nodule in  the superior segment, stable subcarinal nodal mass, increased size of 1.1 cm right hilar lymph node.  And severe emphysema.  I reviewed this in detail with patient and wife. Discussed with radiation oncology.  Patient has very severe underlying emphysema with decreased lung function.  Will do a close serial follow-up in 3 months.  Follow back up with radiation oncology.  He remains on oxygen 2 L.  Continues on [place] twice daily.  Uses albuterol inhaler or nebulizer at least 2-3 times a day.   No Known Allergies  Immunization History  Administered Date(s) Administered   Fluad Trivalent(High Dose 65+) 12/06/2022   Influenza Split 12/15/2012   Influenza Whole 11/18/2021   Influenza,inj,Quad PF,6+ Mos 01/21/2018   PFIZER Comirnaty(Gray Top)Covid-19 Tri-Sucrose Vaccine 08/04/2020   Pneumococcal Polysaccharide-23 06/08/2013    Past Medical History:  Diagnosis Date   COPD (chronic obstructive pulmonary disease) (HCC)    with bullous emphysema.    Heavy cigarette smoker before 2003   pt claims only 10 cigs per day, never heavier amounts.    HLD (hyperlipidemia)    Hypertension    Schizophrenia (HCC) 06/28/2013   This is a chronic condition and he lives with family    Tobacco History: Social History   Tobacco Use  Smoking Status Former   Current packs/day: 1.00   Average packs/day: 1 pack/day for 41.0 years (41.0 ttl pk-yrs)   Types: Cigarettes  Smokeless Tobacco Never  Tobacco Comments   Quit 7 - 8 months ago [facility] 07/17/21   Counseling given: Not Answered Tobacco comments: Quit  7 - 8 months ago [facility] 07/17/21   Outpatient Medications Prior to Visit  Medication Sig Dispense Refill   albuterol (VENTOLIN HFA) 108 (90 Base) MCG/ACT inhaler Inhale 2 puffs into the lungs every 4 (four) hours as needed for wheezing or shortness of breath.     albuterol (VENTOLIN HFA) 108 (90 Base) MCG/ACT inhaler Inhale 2 puffs into the lungs every 4 (four) hours as needed for wheezing or shortness of  breath. 1 each 1   amLODipine (NORVASC) 10 MG tablet Take 10 mg by mouth daily.     atorvastatin (LIPITOR) 10 MG tablet Take 10 mg by mouth at bedtime.     benzonatate (TESSALON) 200 MG capsule Take 1 capsule (200 mg total) by mouth 2 (two) times daily as needed for cough. 20 capsule 0   benztropine (COGENTIN) 2 MG tablet Take 1 tablet (2 mg total) by mouth 2 (two) times daily. 180 tablet 1   BREZTRI AEROSPHERE 160-9-4.8 MCG/ACT AERO Inhale 2 puffs into the lungs in the morning and at bedtime.     carvedilol (COREG) 3.125 MG tablet Take 1 tablet (3.125 mg total) by mouth 2 (two) times daily with a meal. (Patient taking differently: Take 1.5625-3.125 mg by mouth See admin instructions. Take one tablet by mouth daily in the morning and one-half tablet by mouth daily in the evening.)  0   castor oil liquid Take 10 mLs by mouth daily as needed for moderate constipation or mild constipation.     cetirizine (ZYRTEC) 10 MG tablet Take 10 mg by mouth daily as needed for allergies or rhinitis.     cholecalciferol (VITAMIN D) 25 MCG (1000 UNIT) tablet Take 4,000 Units by mouth every evening.      famotidine (PEPCID) 40 MG tablet Take 40 mg by mouth 2 (two) times daily.     ipratropium-albuterol (DUONEB) 0.5-2.5 (3) MG/3ML SOLN Take 3 mLs by nebulization every 4 (four) hours.     MILK THISTLE PO Take 1 capsule by mouth daily.     mirtazapine (REMERON) 30 MG tablet Take 1 tablet (30 mg total) by mouth at bedtime as needed (insomnia). 90 tablet 1   multivitamin (ONE-A-DAY MEN'S) TABS tablet Take 1 tablet by mouth daily.     OXYGEN Inhale 2 L/min into the lungs continuous.      polyethylene glycol (MIRALAX / GLYCOLAX) 17 g packet Take 17 g by mouth daily as needed for mild constipation.     risperiDONE (RISPERDAL) 3 MG tablet Take 1 tablet (3 mg total) by mouth 2 (two) times daily. 180 tablet 1   sodium chloride (OCEAN) 0.65 % SOLN nasal spray Place 1 spray into both nostrils as needed for congestion.      tamsulosin (FLOMAX) 0.4 MG CAPS capsule Take 0.8 mg by mouth at bedtime.     No facility-administered medications prior to visit.     Review of Systems:   Constitutional:   No  weight loss, night sweats,  Fevers, chills, +fatigue, or  lassitude.  HEENT:   No headaches,  Difficulty swallowing,  Tooth/dental problems, or  Sore throat,                No sneezing, itching, ear ache, nasal congestion, post nasal drip,   CV:  No chest pain,  Orthopnea, PND, swelling in lower extremities, anasarca, dizziness, palpitations, syncope.   GI  No heartburn, indigestion, abdominal pain, nausea, vomiting, diarrhea, change in bowel habits, loss of appetite, bloody stools.   Resp: Skin: no  rash or lesions.  GU: no dysuria, change in color of urine, no urgency or frequency.  No flank pain, no hematuria   MS:  No joint pain or swelling.  No decreased range of motion.  No back pain.    Physical Exam  BP 102/66 (BP Location: Left Arm, Patient Position: Sitting, Cuff Size: Normal)   Pulse 96   Ht 5\' 6"  (1.676 m)   Wt 149 lb (67.6 kg)   SpO2 97%   BMI 24.05 kg/m   GEN: A/Ox3; pleasant , NAD chronically ill-appearing in wheelchair, on oxygen   HEENT:  [place]/AT,  EACs-clear, TMs-wnl, NOSE-clear, THROAT-clear, no lesions, no postnasal drip or exudate noted.   NECK:  Supple w/ fair ROM; no JVD; normal carotid impulses w/o bruits; no thyromegaly or nodules palpated; no lymphadenopathy.    RESP diminished breath sounds in the bases  no accessory muscle use, no dullness to percussion  CARD:  RRR, no m/r/g, no peripheral edema, pulses intact, no cyanosis or clubbing.  GI:   Soft & nt; nml bowel sounds; no organomegaly or masses detected.   Musco: Warm bil, no deformities or joint swelling noted.   Neuro: alert, no focal deficits noted.    Skin: Warm, no lesions or rashes    Lab Results:    BMET   BNP   ProBNP No results found for: "PROBNP"  Imaging: CT Chest Wo Contrast Result  Date: 04/08/2023 CLINICAL DATA:  COPD with emphysema and right lower lobe lung mass presumed lung cancer, post treatment with empiric SBRT. Chronic respiratory failure on oxygen. XRT reportedly complete at this time. EXAM: CT CHEST WITHOUT CONTRAST TECHNIQUE: Multidetector CT imaging of the chest was performed following the standard protocol without IV contrast. RADIATION DOSE REDUCTION: This exam was performed according to the departmental dose-optimization program which includes automated exposure control, adjustment of the mA and/or kV according to patient size and/or use of iterative reconstruction technique. COMPARISON:  Last chest series was AP and lateral on 10/09/22, portable chest 10/02/2022. Comparison is made with prior chest CTs all without contrast dated 10/09/2022, 06/03/2022 and 02/24/2022. FINDINGS: Cardiovascular: The cardiac size is normal. There are three-vessel coronary artery calcifications. No substantial pericardial effusion. Pulmonary arteries and veins are normal caliber. There are patchy aortic calcific plaques, scattered calcifications in the great vessels, without aneurysm. Mediastinum/Nodes: A subcarinal nodal mass eccentric to the right is unchanged in size at 3.5 x 2.4 cm. A right mid hilar lymph node is 1.1 cm in short axis today, previously 0.8 cm. No other adenopathy is visible without contrast. Axillary regions are clear. Lungs/Pleura: Small amount of retained secretions in both main bronchi with unremarkable trachea. There is severe emphysematous disease throughout the lungs with paraseptal changes predominating, scattered bullous disease. The treated mass in the posterior aspect of the right lower lobe is difficult to distinguish from contiguous airspace disease today, was previously better circumscribed measuring 5.7 x 3.7 cm. Today this is estimated to measure 4.4 x 2.9 cm on 5:94. There are contiguous areas of right lower lobe airspace consolidation, extending above the lesion  to the inferior right hilum and out to the lateral periphery of the right lower lobe as well as inferior to the lesion, all with air bronchograms, consistent with XRT related or bacterial pneumonia versus aspiration. The prior studies demonstrated a roughly parallel right middle lobe opacity in the plane of the right lower lobe mass therefore likely in the XRT plane, which is less confluent today although may extend over  a larger area, on 5:91 measuring 3.8 x 1.9 cm, previously 2.4 x 1.1 cm. I suspect there is probably adjacent XRT related airspace disease exaggerating the size of this. There is a stable medial lingular nodule measuring 1.4 x 0.7 cm on 5:85. In the superior segment of the right lower lobe there is increasing prominence of a nodule adjacent to dystrophic calcifications, today measuring 1.5 x 1.4 cm on 5:77, was previously 1 x 0.7 cm. This could represent a new neoplasm. The remaining lungs are clear.  There is no pleural effusion. Upper Abdomen: Limited detail due to breathing motion. No obvious acute abnormality. Status post cholecystectomy. Aortic atherosclerosis. Musculoskeletal: No regional bone metastasis is seen. IMPRESSION: 1. The treated mass in the posterior aspect of the right lower lobe is difficult to distinguish from contiguous airspace disease today, but estimated to measure 4.4 x 2.9 cm, previously 5.7 x 3.7 cm. 2. Contiguous areas of right lower lobe airspace consolidation with air bronchograms, consistent with XRT related or bacterial pneumonia versus aspiration. 3. The prior studies demonstrated a roughly parallel right middle lobe opacity in the plane of the right lower lobe mass therefore likely in the XRT plane, which is less confluent today although may extend over a larger area, measuring 3.8 x 1.9 cm, previously 2.4 x 1.1 cm. I suspect there is probably adjacent XRT related airspace disease exaggerating the size of this. 4. Stable 1.4 x 0.7 cm medial lingular nodule. 5.  Increasing prominence of a 1.5 x 1.4 cm nodule in the superior segment of the right lower lobe adjacent to dystrophic calcifications, previously 1 x 0.7 cm. This could represent a new neoplasm. 6. Stable 3.5 x 2.4 cm subcarinal nodal mass eccentric to the right. 7. Increased size of a 1.1 cm right hilar lymph node. 8. Severe paraseptal predominant emphysema. 9. Aortic and coronary artery atherosclerosis. Aortic Atherosclerosis (ICD10-I70.0) and Emphysema (ICD10-J43.9). Electronically Signed   By: Almira Bar M.D.   On: 04/08/2023 02:17    Administration History     None          Latest Ref Rng & Units 07/26/2013   10:51 AM  PFT Results  FVC-Pre L 1.95  P  FVC-Predicted Pre % 53  P  FVC-Post L 2.02  P  FVC-Predicted Post % 55  P  Pre FEV1/FVC % % 53  P  Post FEV1/FCV % % 52  P  FEV1-Pre L 1.03  P  FEV1-Predicted Pre % 35  P  FEV1-Post L 1.05  P    P Preliminary result    No results found for: "NITRICOXIDE"      Assessment & Plan:   COPD (chronic obstructive pulmonary disease) (HCC) Severe COPD-currently with an exacerbation plus or minus pneumonia-clinically improving on antibiotic therapy. Continue therapy maintenance inhaler with Breztri.  Albuterol and DuoNeb as needed.  Add in flutter valve twice daily.   Plan  Patient Instructions  Finish Doxycycline as directed.  Continue on Breztri 2 puffs twice daily, rinse after use Albuterol inhaler or DuoNeb nebulizer as needed Add Flutter valve Twice daily  As needed  cough/congestion  High protein diet.  Activity as tolerated Continue on Oxygen 2l/m  Mucinex DM Twice daily  As needed   [facility] months [facility]  Follow up with Dr. Delton Coombes  in 3 months and As needed   Please contact office for sooner follow up if symptoms do not improve or worsen or seek emergency care  Chronic respiratory failure with hypoxia (HCC) Stable on oxygen 2 L.  Malignant neoplasm of lower lobe of right lung (HCC) Large  right lower lobe lung mass presumed underlying malignancy.  Status post SBRT.  Follow-up serial imaging shows decreased right lower lobe lung mass at 4.4 cm previously at 5.7 cm.  Along with XRT changes.  Increased right lower lobe nodule and right hilar lymph node.  Stable lingula nodule.  Case discussed with radiation oncology.  Will have close serial follow-up with CT chest in 3 months.  Patient has severe underlying emphysema, decreased lung function, oxygen dependent.  Concern for additional radiation risk would outweigh benefit.  Plan  Patient Instructions  Finish Doxycycline as directed.  Continue on Breztri 2 puffs twice daily, rinse after use Albuterol inhaler or DuoNeb nebulizer as needed Add Flutter valve Twice daily  As needed  cough/congestion  High protein diet.  Activity as tolerated Continue on Oxygen 2l/m  Mucinex DM Twice daily  As needed   CT chest 3 months at [facility]  Follow up with Dr. Delton Coombes  in 3 months and As needed   Please contact office for sooner follow up if symptoms do not improve or worsen or seek emergency care        I spent  43  minutes dedicated to the care of this patient on the date of this encounter to include pre-visit review of records, face-to-face time with the patient discussing conditions above, post visit ordering of testing, clinical documentation with the electronic health record, making appropriate referrals as documented, and communicating necessary findings to members of the patients care team.   Rubye Oaks, NP 04/14/2023

## 2023-04-15 ENCOUNTER — Inpatient Hospital Stay (HOSPITAL_COMMUNITY)
Admission: EM | Admit: 2023-04-15 | Discharge: 2023-04-17 | DRG: 194 | Disposition: A | Discharge status: Home / Self Care | Payer: Medicare Other | Attending: Internal Medicine | Admitting: Internal Medicine

## 2023-04-15 ENCOUNTER — Encounter (HOSPITAL_COMMUNITY): Payer: Self-pay

## 2023-04-15 ENCOUNTER — Emergency Department (HOSPITAL_COMMUNITY): Payer: Medicare Other

## 2023-04-15 DIAGNOSIS — K219 Gastro-esophageal reflux disease without esophagitis: Secondary | ICD-10-CM | POA: Diagnosis present

## 2023-04-15 DIAGNOSIS — Z66 Do not resuscitate: Secondary | ICD-10-CM | POA: Diagnosis present

## 2023-04-15 DIAGNOSIS — J168 Pneumonia due to other specified infectious organisms: Secondary | ICD-10-CM | POA: Diagnosis not present

## 2023-04-15 DIAGNOSIS — N281 Cyst of kidney, acquired: Secondary | ICD-10-CM | POA: Diagnosis not present

## 2023-04-15 DIAGNOSIS — J189 Pneumonia, unspecified organism: Secondary | ICD-10-CM | POA: Diagnosis not present

## 2023-04-15 DIAGNOSIS — E872 Acidosis, unspecified: Secondary | ICD-10-CM | POA: Diagnosis present

## 2023-04-15 DIAGNOSIS — I10 Essential (primary) hypertension: Secondary | ICD-10-CM | POA: Diagnosis present

## 2023-04-15 DIAGNOSIS — Z1152 Encounter for screening for COVID-19: Secondary | ICD-10-CM

## 2023-04-15 DIAGNOSIS — R0602 Shortness of breath: Secondary | ICD-10-CM | POA: Diagnosis not present

## 2023-04-15 DIAGNOSIS — J9611 Chronic respiratory failure with hypoxia: Secondary | ICD-10-CM | POA: Diagnosis present

## 2023-04-15 DIAGNOSIS — Z7984 Long term (current) use of oral hypoglycemic drugs: Secondary | ICD-10-CM | POA: Diagnosis not present

## 2023-04-15 DIAGNOSIS — E876 Hypokalemia: Secondary | ICD-10-CM | POA: Diagnosis present

## 2023-04-15 DIAGNOSIS — I700 Atherosclerosis of aorta: Secondary | ICD-10-CM | POA: Diagnosis present

## 2023-04-15 DIAGNOSIS — J441 Chronic obstructive pulmonary disease with (acute) exacerbation: Secondary | ICD-10-CM | POA: Diagnosis present

## 2023-04-15 DIAGNOSIS — E785 Hyperlipidemia, unspecified: Secondary | ICD-10-CM | POA: Diagnosis present

## 2023-04-15 DIAGNOSIS — R591 Generalized enlarged lymph nodes: Secondary | ICD-10-CM | POA: Diagnosis not present

## 2023-04-15 DIAGNOSIS — R935 Abnormal findings on diagnostic imaging of other abdominal regions, including retroperitoneum: Secondary | ICD-10-CM | POA: Diagnosis not present

## 2023-04-15 DIAGNOSIS — Z833 Family history of diabetes mellitus: Secondary | ICD-10-CM

## 2023-04-15 DIAGNOSIS — J440 Chronic obstructive pulmonary disease with acute lower respiratory infection: Secondary | ICD-10-CM | POA: Diagnosis present

## 2023-04-15 DIAGNOSIS — Z87891 Personal history of nicotine dependence: Secondary | ICD-10-CM | POA: Diagnosis not present

## 2023-04-15 DIAGNOSIS — N400 Enlarged prostate without lower urinary tract symptoms: Secondary | ICD-10-CM | POA: Diagnosis present

## 2023-04-15 DIAGNOSIS — I2510 Atherosclerotic heart disease of native coronary artery without angina pectoris: Secondary | ICD-10-CM | POA: Diagnosis not present

## 2023-04-15 DIAGNOSIS — Z9049 Acquired absence of other specified parts of digestive tract: Secondary | ICD-10-CM

## 2023-04-15 DIAGNOSIS — R1011 Right upper quadrant pain: Secondary | ICD-10-CM | POA: Diagnosis present

## 2023-04-15 DIAGNOSIS — K760 Fatty (change of) liver, not elsewhere classified: Secondary | ICD-10-CM | POA: Diagnosis not present

## 2023-04-15 DIAGNOSIS — F209 Schizophrenia, unspecified: Secondary | ICD-10-CM | POA: Diagnosis present

## 2023-04-15 DIAGNOSIS — Z8249 Family history of ischemic heart disease and other diseases of the circulatory system: Secondary | ICD-10-CM

## 2023-04-15 DIAGNOSIS — E119 Type 2 diabetes mellitus without complications: Secondary | ICD-10-CM | POA: Diagnosis present

## 2023-04-15 DIAGNOSIS — C3431 Malignant neoplasm of lower lobe, right bronchus or lung: Secondary | ICD-10-CM | POA: Diagnosis present

## 2023-04-15 DIAGNOSIS — Z79899 Other long term (current) drug therapy: Secondary | ICD-10-CM

## 2023-04-15 DIAGNOSIS — R109 Unspecified abdominal pain: Secondary | ICD-10-CM | POA: Diagnosis not present

## 2023-04-15 DIAGNOSIS — Z85118 Personal history of other malignant neoplasm of bronchus and lung: Secondary | ICD-10-CM | POA: Diagnosis not present

## 2023-04-15 DIAGNOSIS — Z9981 Dependence on supplemental oxygen: Secondary | ICD-10-CM | POA: Diagnosis not present

## 2023-04-15 DIAGNOSIS — J439 Emphysema, unspecified: Secondary | ICD-10-CM | POA: Diagnosis present

## 2023-04-15 DIAGNOSIS — R000 Tachycardia, unspecified: Secondary | ICD-10-CM | POA: Diagnosis not present

## 2023-04-15 DIAGNOSIS — R1032 Left lower quadrant pain: Secondary | ICD-10-CM | POA: Diagnosis not present

## 2023-04-15 LAB — COMPREHENSIVE METABOLIC PANEL
ALT: 45 U/L — ABNORMAL HIGH (ref 0–44)
AST: 33 U/L (ref 15–41)
Albumin: 2.5 g/dL — ABNORMAL LOW (ref 3.5–5.0)
Alkaline Phosphatase: 74 U/L (ref 38–126)
Anion gap: 13 (ref 5–15)
BUN: 8 mg/dL (ref 8–23)
CO2: 27 mmol/L (ref 22–32)
Calcium: 9.3 mg/dL (ref 8.9–10.3)
Chloride: 93 mmol/L — ABNORMAL LOW (ref 98–111)
Creatinine, Ser: 0.68 mg/dL (ref 0.61–1.24)
GFR, Estimated: >60 mL/min (ref >60)
Glucose, Bld: 117 mg/dL — ABNORMAL HIGH (ref 70–99)
Potassium: 3.6 mmol/L (ref 3.5–5.1)
Sodium: 133 mmol/L — ABNORMAL LOW (ref 135–145)
Total Bilirubin: 0.4 mg/dL (ref 0.0–1.2)
Total Protein: 7.2 g/dL (ref 6.5–8.1)

## 2023-04-15 LAB — CBC WITH DIFFERENTIAL/PLATELET
Abs Immature Granulocytes: 0.09 10*3/uL — ABNORMAL HIGH (ref 0.00–0.07)
Basophils Absolute: 0.1 10*3/uL (ref 0.0–0.1)
Basophils Relative: 1 %
Eosinophils Absolute: 0.2 10*3/uL (ref 0.0–0.5)
Eosinophils Relative: 2 %
HCT: 40.4 % (ref 39.0–52.0)
Hemoglobin: 13.1 g/dL (ref 13.0–17.0)
Immature Granulocytes: 1 %
Lymphocytes Relative: 6 %
Lymphs Abs: 0.8 10*3/uL (ref 0.7–4.0)
MCH: 27.2 pg (ref 26.0–34.0)
MCHC: 32.4 g/dL (ref 30.0–36.0)
MCV: 84 fL (ref 80.0–100.0)
Monocytes Absolute: 1.3 10*3/uL — ABNORMAL HIGH (ref 0.1–1.0)
Monocytes Relative: 10 %
Neutro Abs: 10.5 10*3/uL — ABNORMAL HIGH (ref 1.7–7.7)
Neutrophils Relative %: 80 %
Platelets: 466 10*3/uL — ABNORMAL HIGH (ref 150–400)
RBC: 4.81 MIL/uL (ref 4.22–5.81)
RDW: 14.4 % (ref 11.5–15.5)
WBC: 13 10*3/uL — ABNORMAL HIGH (ref 4.0–10.5)
nRBC: 0 % (ref 0.0–0.2)

## 2023-04-15 LAB — LIPASE, BLOOD: Lipase: 29 U/L (ref 11–51)

## 2023-04-15 MED ORDER — OXYCODONE HCL 5 MG PO TABS
5.0000 mg | ORAL_TABLET | Freq: Once | ORAL | Status: AC
  Administered 2023-04-15: 5 mg via ORAL
  Filled 2023-04-15: qty 1

## 2023-04-15 MED ORDER — ACETAMINOPHEN 500 MG PO TABS
1000.0000 mg | ORAL_TABLET | Freq: Once | ORAL | Status: AC
  Administered 2023-04-15: 1000 mg via ORAL
  Filled 2023-04-15: qty 2

## 2023-04-15 MED ORDER — IPRATROPIUM-ALBUTEROL 0.5-2.5 (3) MG/3ML IN SOLN
3.0000 mL | Freq: Once | RESPIRATORY_TRACT | Status: AC
  Administered 2023-04-15: 3 mL via RESPIRATORY_TRACT
  Filled 2023-04-15: qty 3

## 2023-04-15 MED ORDER — IOHEXOL 350 MG/ML SOLN
65.0000 mL | Freq: Once | INTRAVENOUS | Status: AC | PRN
  Administered 2023-04-15: 65 mL via INTRAVENOUS

## 2023-04-15 NOTE — ED Notes (Signed)
Has a fresh light green and lav tube in lab if needed

## 2023-04-15 NOTE — ED Notes (Signed)
Ambulated patient around the nurses' station while on pulse oximetry. When sitting patient was at 98% on 2 liters. When walking patient dropped to 97% but after was able to maintain a oxygen saturation of between 98% to 100%.

## 2023-04-15 NOTE — ED Triage Notes (Signed)
Pt is coming from home, pt I coming in for chronic issues he wants checked. Pt was Dx lung cancr in the lower right lung, he has been coughing up grey mucous the last 6 months, also having some pain in the right lower flank. He is not having any urinary symptoms.   Medic vitals   143/73 123hr 18rr 98% 2l

## 2023-04-15 NOTE — ED Provider Triage Note (Signed)
Emergency Medicine Provider Triage Evaluation Note  Blake Mcknight , a 68 y.o. male  was evaluated in triage.  Pt complains of right low back pain.  This been going on for about a day.  Nothing he thinks seems to make it better or worse.  Later says maybe it is worse when he lays down flat.  Denies abdominal pain denies fevers denies urinary symptoms denies cough or congestion..  Review of Systems  Positive: Right flank pain Negative: Abdominal pain fever urinary symptoms  Physical Exam  BP (!) 129/112 (BP Location: Left Arm)   Pulse (!) 118   Temp 98.2 F (36.8 C) (Oral)   Resp 18   SpO2 97%  Gen:   Awake, no distress   Resp:  Normal effort  MSK:   Moves extremities without difficulty  Other:  I do not appreciate any obvious discomfort to the right flank.  No CVA tenderness.  Benign abdominal exam.  No obvious midline spinal tenderness develops or deformities.  Medical Decision Making  Medically screening exam initiated at 2:36 PM.  Appropriate orders placed.  Blake Mcknight was informed that the remainder of the evaluation will be completed by another provider, this initial triage assessment does not replace that evaluation, and the importance of remaining in the ED until their evaluation is complete.  Patient with a history of lung cancer now with new right sided back pain.  Patient is tachycardic, must consider pulmonary embolism or other possible intraspinal or abdominal pathology.  Will obtain CT imaging.   Melene Plan, DO 04/15/23 [id number]

## 2023-04-16 DIAGNOSIS — J440 Chronic obstructive pulmonary disease with acute lower respiratory infection: Secondary | ICD-10-CM | POA: Diagnosis present

## 2023-04-16 DIAGNOSIS — E872 Acidosis, unspecified: Secondary | ICD-10-CM | POA: Diagnosis present

## 2023-04-16 DIAGNOSIS — J189 Pneumonia, unspecified organism: Secondary | ICD-10-CM | POA: Diagnosis present

## 2023-04-16 DIAGNOSIS — Z87891 Personal history of nicotine dependence: Secondary | ICD-10-CM | POA: Diagnosis not present

## 2023-04-16 DIAGNOSIS — F209 Schizophrenia, unspecified: Secondary | ICD-10-CM | POA: Diagnosis present

## 2023-04-16 DIAGNOSIS — Z833 Family history of diabetes mellitus: Secondary | ICD-10-CM | POA: Diagnosis not present

## 2023-04-16 DIAGNOSIS — Z9981 Dependence on supplemental oxygen: Secondary | ICD-10-CM | POA: Diagnosis not present

## 2023-04-16 DIAGNOSIS — R1011 Right upper quadrant pain: Secondary | ICD-10-CM | POA: Diagnosis present

## 2023-04-16 DIAGNOSIS — K219 Gastro-esophageal reflux disease without esophagitis: Secondary | ICD-10-CM | POA: Diagnosis present

## 2023-04-16 DIAGNOSIS — Z7984 Long term (current) use of oral hypoglycemic drugs: Secondary | ICD-10-CM | POA: Diagnosis not present

## 2023-04-16 DIAGNOSIS — Z8249 Family history of ischemic heart disease and other diseases of the circulatory system: Secondary | ICD-10-CM | POA: Diagnosis not present

## 2023-04-16 DIAGNOSIS — E876 Hypokalemia: Secondary | ICD-10-CM | POA: Diagnosis present

## 2023-04-16 DIAGNOSIS — K760 Fatty (change of) liver, not elsewhere classified: Secondary | ICD-10-CM | POA: Diagnosis present

## 2023-04-16 DIAGNOSIS — E785 Hyperlipidemia, unspecified: Secondary | ICD-10-CM | POA: Diagnosis present

## 2023-04-16 DIAGNOSIS — C3431 Malignant neoplasm of lower lobe, right bronchus or lung: Secondary | ICD-10-CM | POA: Diagnosis present

## 2023-04-16 DIAGNOSIS — J439 Emphysema, unspecified: Secondary | ICD-10-CM | POA: Diagnosis present

## 2023-04-16 DIAGNOSIS — E119 Type 2 diabetes mellitus without complications: Secondary | ICD-10-CM | POA: Diagnosis present

## 2023-04-16 DIAGNOSIS — R0602 Shortness of breath: Secondary | ICD-10-CM | POA: Diagnosis present

## 2023-04-16 DIAGNOSIS — J9611 Chronic respiratory failure with hypoxia: Secondary | ICD-10-CM | POA: Diagnosis present

## 2023-04-16 DIAGNOSIS — I700 Atherosclerosis of aorta: Secondary | ICD-10-CM | POA: Diagnosis present

## 2023-04-16 DIAGNOSIS — I10 Essential (primary) hypertension: Secondary | ICD-10-CM | POA: Diagnosis present

## 2023-04-16 DIAGNOSIS — J441 Chronic obstructive pulmonary disease with (acute) exacerbation: Secondary | ICD-10-CM | POA: Diagnosis present

## 2023-04-16 DIAGNOSIS — Z66 Do not resuscitate: Secondary | ICD-10-CM | POA: Diagnosis present

## 2023-04-16 DIAGNOSIS — Z1152 Encounter for screening for COVID-19: Secondary | ICD-10-CM | POA: Diagnosis not present

## 2023-04-16 DIAGNOSIS — N400 Enlarged prostate without lower urinary tract symptoms: Secondary | ICD-10-CM | POA: Diagnosis present

## 2023-04-16 LAB — URINALYSIS, ROUTINE W REFLEX MICROSCOPIC
Bilirubin Urine: NEGATIVE
Glucose, UA: 150 mg/dL — AB
Hgb urine dipstick: NEGATIVE
Ketones, ur: NEGATIVE mg/dL
Leukocytes,Ua: NEGATIVE
Nitrite: NEGATIVE
Protein, ur: NEGATIVE mg/dL
Specific Gravity, Urine: 1.016 (ref 1.005–1.030)
pH: 6 (ref 5.0–8.0)

## 2023-04-16 LAB — BASIC METABOLIC PANEL
Anion gap: 14 (ref 5–15)
BUN: 8 mg/dL (ref 8–23)
CO2: 24 mmol/L (ref 22–32)
Calcium: 8.6 mg/dL — ABNORMAL LOW (ref 8.9–10.3)
Chloride: 95 mmol/L — ABNORMAL LOW (ref 98–111)
Creatinine, Ser: 0.55 mg/dL — ABNORMAL LOW (ref 0.61–1.24)
GFR, Estimated: >60 mL/min (ref >60)
Glucose, Bld: 153 mg/dL — ABNORMAL HIGH (ref 70–99)
Potassium: 3.6 mmol/L (ref 3.5–5.1)
Sodium: 133 mmol/L — ABNORMAL LOW (ref 135–145)

## 2023-04-16 LAB — RESPIRATORY PANEL BY PCR

## 2023-04-16 LAB — CBC
HCT: 35.6 % — ABNORMAL LOW (ref 39.0–52.0)
Hemoglobin: 12 g/dL — ABNORMAL LOW (ref 13.0–17.0)
MCH: 28.2 pg (ref 26.0–34.0)
MCHC: 33.7 g/dL (ref 30.0–36.0)
MCV: 83.6 fL (ref 80.0–100.0)
Platelets: 415 10*3/uL — ABNORMAL HIGH (ref 150–400)
RBC: 4.26 MIL/uL (ref 4.22–5.81)
RDW: 14.5 % (ref 11.5–15.5)
WBC: 13.8 10*3/uL — ABNORMAL HIGH (ref 4.0–10.5)
nRBC: 0 % (ref 0.0–0.2)

## 2023-04-16 LAB — SARS CORONAVIRUS 2 BY RT PCR: SARS Coronavirus 2 by RT PCR: NEGATIVE

## 2023-04-16 LAB — HIV ANTIBODY (ROUTINE TESTING W REFLEX): HIV Screen 4th Generation wRfx: NONREACTIVE

## 2023-04-16 LAB — LACTIC ACID, PLASMA
Lactic Acid, Venous: 2 mmol/L (ref 0.5–1.9)
Lactic Acid, Venous: 2.9 mmol/L (ref 0.5–1.9)

## 2023-04-16 LAB — GLUCOSE, CAPILLARY: Glucose-Capillary: 191 mg/dL — ABNORMAL HIGH (ref 70–99)

## 2023-04-16 MED ORDER — AZITHROMYCIN 250 MG PO TABS
500.0000 mg | ORAL_TABLET | Freq: Once | ORAL | Status: AC
  Administered 2023-04-16: 500 mg via ORAL
  Filled 2023-04-16: qty 2

## 2023-04-16 MED ORDER — SODIUM CHLORIDE 0.9 % IV SOLN
2.0000 g | INTRAVENOUS | Status: DC
  Administered 2023-04-16: 2 g via INTRAVENOUS
  Filled 2023-04-16: qty 20

## 2023-04-16 MED ORDER — ENOXAPARIN SODIUM 40 MG/0.4ML IJ SOSY: 40.0000 mg | PREFILLED_SYRINGE | INTRAMUSCULAR | Status: DC

## 2023-04-16 MED ORDER — BUDESONIDE 0.25 MG/2ML IN SUSP
0.2500 mg | Freq: Two times a day (BID) | RESPIRATORY_TRACT | Status: DC
  Administered 2023-04-16 – 2023-04-17 (×3): 0.25 mg via RESPIRATORY_TRACT
  Filled 2023-04-16 (×3): qty 2

## 2023-04-16 MED ORDER — NALOXONE HCL 0.4 MG/ML IJ SOLN: 0.4000 mg | INTRAMUSCULAR | Status: DC | PRN

## 2023-04-16 MED ORDER — SODIUM CHLORIDE 0.9 % IV BOLUS
500.0000 mL | Freq: Once | INTRAVENOUS | Status: AC
  Administered 2023-04-16: 500 mL via INTRAVENOUS

## 2023-04-16 MED ORDER — ALBUTEROL SULFATE (2.5 MG/3ML) 0.083% IN NEBU
2.5000 mg | INHALATION_SOLUTION | RESPIRATORY_TRACT | Status: DC | PRN
  Administered 2023-04-16 – 2023-04-17 (×3): 2.5 mg via RESPIRATORY_TRACT
  Filled 2023-04-16 (×3): qty 3

## 2023-04-16 MED ORDER — IPRATROPIUM-ALBUTEROL 0.5-2.5 (3) MG/3ML IN SOLN
3.0000 mL | RESPIRATORY_TRACT | Status: DC
  Administered 2023-04-16: 3 mL via RESPIRATORY_TRACT
  Filled 2023-04-16: qty 3

## 2023-04-16 MED ORDER — OXYCODONE HCL 5 MG PO TABS: 5.0000 mg | ORAL_TABLET | Freq: Four times a day (QID) | ORAL | Status: DC | PRN

## 2023-04-16 MED ORDER — PREDNISONE 20 MG PO TABS
40.0000 mg | ORAL_TABLET | Freq: Every day | ORAL | Status: DC
  Administered 2023-04-17: 40 mg via ORAL
  Filled 2023-04-16: qty 2

## 2023-04-16 MED ORDER — DM-GUAIFENESIN ER 30-600 MG PO TB12
1.0000 | ORAL_TABLET | Freq: Two times a day (BID) | ORAL | Status: DC
  Administered 2023-04-16 – 2023-04-17 (×3): 1 via ORAL
  Filled 2023-04-16 (×4): qty 1

## 2023-04-16 MED ORDER — IPRATROPIUM-ALBUTEROL 0.5-2.5 (3) MG/3ML IN SOLN
3.0000 mL | Freq: Four times a day (QID) | RESPIRATORY_TRACT | Status: DC
  Administered 2023-04-16 – 2023-04-17 (×5): 3 mL via RESPIRATORY_TRACT
  Filled 2023-04-16 (×5): qty 3

## 2023-04-16 MED ORDER — SODIUM CHLORIDE 0.9 % IV SOLN
1.0000 g | Freq: Once | INTRAVENOUS | Status: AC
  Administered 2023-04-16: 1 g via INTRAVENOUS
  Filled 2023-04-16: qty 10

## 2023-04-16 MED ORDER — SODIUM CHLORIDE 0.9 % IV SOLN: 1.0000 g | INTRAVENOUS | Status: DC

## 2023-04-16 MED ORDER — ACETAMINOPHEN 650 MG RE SUPP: 650.0000 mg | Freq: Four times a day (QID) | RECTAL | Status: DC | PRN

## 2023-04-16 MED ORDER — LACTATED RINGERS IV BOLUS
1000.0000 mL | Freq: Once | INTRAVENOUS | Status: AC
  Administered 2023-04-16: 1000 mL via INTRAVENOUS

## 2023-04-16 MED ORDER — ACETAMINOPHEN 325 MG PO TABS
650.0000 mg | ORAL_TABLET | Freq: Four times a day (QID) | ORAL | Status: DC | PRN
  Filled 2023-04-16: qty 2

## 2023-04-16 MED ORDER — AZITHROMYCIN 500 MG PO TABS
500.0000 mg | ORAL_TABLET | Freq: Every day | ORAL | Status: DC
  Administered 2023-04-16 – 2023-04-17 (×2): 500 mg via ORAL
  Filled 2023-04-16 (×2): qty 1

## 2023-04-16 MED ORDER — METHYLPREDNISOLONE SODIUM SUCC 125 MG IJ SOLR
120.0000 mg | INTRAMUSCULAR | Status: DC
  Administered 2023-04-16: 120 mg via INTRAVENOUS
  Filled 2023-04-16: qty 2

## 2023-04-16 NOTE — ED Provider Notes (Signed)
[facility] AT [facility] Provider Note   CSN: [id number] Arrival date & time: 04/15/23  1340     History Chief Complaint  Patient presents with   Flank Pain    Blake Mcknight is a 68 y.o. male  hypertension, bullous emphysema, respiratory failure with hypoxia, COPD on 2L [employer] at baseline, hyperlipidemia, diabetes, presumed lung cancer, and schizophrenia presents to the ER for evaluation of SOB. History is obtained from the wife and patient. Patient appears a little confused, however wife reports that is his new baseline since the radiation a few months prior. She reports that he was walking back to the bathroom and was becoming short of breath and he had some wheezing so she called EMS. She reports he has had a more productive cough over the past few weeks. He finished his doxycycline prescription within the past day or two for COPD exacerbation. She reports some nasal congestion, but more chronic. Denies any fevers. The patient denies any chest pain, but his wife reports that he has been complaining over the past few weeks of some right sided lower rib pain. Denies any abdominal pain, nausea, vomiting, bowel changes, or urinary changes. NKDA.    Flank Pain Associated symptoms include shortness of breath. Pertinent negatives include no chest pain.       Home Medications Prior to Admission medications   Medication Sig Start Date End Date Taking? Authorizing Provider  albuterol (VENTOLIN HFA) 108 (90 Base) MCG/ACT inhaler Inhale 2 puffs into the lungs every 4 (four) hours as needed for wheezing or shortness of breath. 10/02/22  Yes Cathren Laine, MD  amLODipine (NORVASC) 10 MG tablet Take 10 mg by mouth daily. 06/26/21  Yes [provider]  atorvastatin (LIPITOR) 10 MG tablet Take 10 mg by mouth at bedtime. 09/21/15  Yes [provider]  benztropine (COGENTIN) 2 MG tablet Take 1 tablet (2 mg total) by mouth 2 (two) times daily. 03/27/23  Yes  Arfeen, Phillips Grout, MD  BREZTRI AEROSPHERE 160-9-4.8 MCG/ACT AERO Inhale 2 puffs into the lungs in the morning and at bedtime. 07/18/20  Yes [provider]  carvedilol (COREG) 3.125 MG tablet Take 1 tablet (3.125 mg total) by mouth 2 (two) times daily with a meal. 02/20/19  Yes Hanley Ben, Kshitiz, MD  castor oil liquid Take 10 mLs by mouth daily as needed for moderate constipation or mild constipation.   Yes [provider]  cetirizine (ZYRTEC) 10 MG tablet Take 10 mg by mouth daily as needed for allergies or rhinitis.   Yes [provider]  cholecalciferol (VITAMIN D) 25 MCG (1000 UNIT) tablet Take 4,000 Units by mouth every evening.    Yes [provider]  famotidine (PEPCID) 40 MG tablet Take 40 mg by mouth 2 (two) times daily. 12/28/18  Yes [provider]  ipratropium-albuterol (DUONEB) 0.5-2.5 (3) MG/3ML SOLN Take 3 mLs by nebulization every 4 (four) hours. 04/27/21  Yes [provider]  JANUVIA 100 MG tablet Take 100 mg by mouth daily. 01/30/23  Yes [provider]  MILK THISTLE PO Take 1 capsule by mouth daily.   Yes [provider]  mirtazapine (REMERON) 30 MG tablet Take 1 tablet (30 mg total) by mouth at bedtime as needed (insomnia). 03/27/23 03/26/24 Yes Arfeen, Phillips Grout, MD  multivitamin (ONE-A-DAY MEN'S) TABS tablet Take 1 tablet by mouth daily.   Yes [provider]  OXYGEN Inhale 2 L/min into the lungs continuous.    Yes [provider]  polyethylene glycol (MIRALAX / GLYCOLAX) 17 g packet Take 17 g by mouth daily as needed for mild constipation.   Yes [provider]  predniSONE (DELTASONE) 10 MG tablet Take 10 mg by mouth 2 (two) times daily. 03/20/23  Yes [provider]  risperiDONE (RISPERDAL) 3 MG tablet Take 1 tablet (3 mg total) by mouth 2 (two) times daily. 03/27/23  Yes Arfeen, Phillips Grout, MD  sodium chloride (OCEAN) 0.65 % SOLN nasal spray Place 1 spray into both nostrils as needed for  congestion.   Yes [provider]  tamsulosin (FLOMAX) 0.4 MG CAPS capsule Take 0.4 mg by mouth in the morning and at bedtime. 07/23/20  Yes [provider]      Allergies    Patient has no known allergies.    Review of Systems   Review of Systems  Constitutional:  Negative for fever.  Respiratory:  Positive for cough and shortness of breath.   Cardiovascular:  Negative for chest pain.  Genitourinary:  Positive for flank pain.  See HPI  Physical Exam Updated Vital Signs BP 102/66 (BP Location: Right Arm)   Pulse 96   Temp 97.6 F (36.4 C) (Oral)   Resp 16   SpO2 100%  Physical Exam Vitals and nursing note reviewed.  Constitutional:      Appearance: He is not toxic-appearing.     Comments: Cachectic, chronically ill appearing  HENT:     Mouth/Throat:     Mouth: Mucous membranes are moist.  Eyes:     General: No scleral icterus. Cardiovascular:     Rate and Rhythm: Normal rate.  Pulmonary:     Effort: Pulmonary effort is normal. No respiratory distress.     Comments: Diminished in all lung fields.  No tachypnea.  Satting well on his baseline 2 L. Abdominal:     Palpations: Abdomen is soft.     Tenderness: There is no abdominal tenderness. There is no guarding or rebound.  Musculoskeletal:     Right lower leg: No edema.     Left lower leg: No edema.  Skin:    General: Skin is warm and dry.  Neurological:     Mental Status: He is alert. Mental status is at baseline.     ED Results / Procedures / Treatments   Labs (all labs ordered are listed, but only abnormal results are displayed) Labs Reviewed  CBC WITH DIFFERENTIAL/PLATELET - Abnormal; Notable for the following components:      Result Value   WBC 13.0 (*)    Platelets 466 (*)    Neutro Abs 10.5 (*)    Monocytes Absolute 1.3 (*)    Abs Immature Granulocytes 0.09 (*)    All other components within normal limits  COMPREHENSIVE METABOLIC PANEL - Abnormal; Notable for the following components:    Sodium 133 (*)    Chloride 93 (*)    Glucose, Bld 117 (*)    Albumin 2.5 (*)    ALT 45 (*)    All other components within normal limits  LACTIC ACID, PLASMA - Abnormal; Notable for the following components:   Lactic Acid, Venous 2.0 (*)    All other components within normal limits  LIPASE, BLOOD  URINALYSIS, ROUTINE W REFLEX MICROSCOPIC  LACTIC ACID, PLASMA  CBC  BASIC METABOLIC PANEL  HIV ANTIBODY (ROUTINE TESTING W REFLEX)  I-STAT CHEM 8, ED    EKG None  Radiology CT L-SPINE NO CHARGE Result Date: 04/15/2023 CLINICAL DATA:  Right flank  pain.  History of lung cancer. EXAM: CT LUMBAR SPINE WITH CONTRAST TECHNIQUE: Technique: Multiplanar CT images of the lumbar spine were reconstructed from contemporary CT of the Abdomen and Pelvis. RADIATION DOSE REDUCTION: This exam was performed according to the departmental dose-optimization program which includes automated exposure control, adjustment of the mA and/or kV according to patient size and/or use of iterative reconstruction technique. CONTRAST:  No additional. COMPARISON:  CT abdomen and pelvis 05/22/2021 FINDINGS: Segmentation: Transitional lumbosacral anatomy with partially lumbarized S1. Alignment: Normal. Vertebrae: No acute fracture or suspicious osseous lesion. Paraspinal and other soft tissues: No acute abnormality in the paraspinal soft tissues. Remainder of the abdomen and pelvis reported separately. Disc levels: Preserved disc heights throughout the lumbar spine. Mild diffuse mid and lower lumbar spinal canal narrowing due to congenitally short pedicles and mildly prominent epidural fat. At L5-S1, disc bulging, prominent dorsal epidural fat, ligamentum flavum hypertrophy, and moderate to severe right greater than left facet hypertrophy result in mild-to-moderate spinal stenosis and mild right and mild-to-moderate left neural foraminal stenosis. IMPRESSION: 1. No acute osseous abnormality. 2. Mild-to-moderate spinal and neural  foraminal stenosis at L5-S1. Electronically Signed   By: Sebastian Ache M.D.   On: 04/15/2023 17:54   CT Angio Chest PE W and/or Wo Contrast Result Date: 04/15/2023 CLINICAL DATA:  High probability for PE. Diagnosis with right-sided lung cancer. EXAM: CT ANGIOGRAPHY CHEST WITH CONTRAST TECHNIQUE: Multidetector CT imaging of the chest was performed using the standard protocol during bolus administration of intravenous contrast. Multiplanar CT image reconstructions and MIPs were obtained to evaluate the vascular anatomy. RADIATION DOSE REDUCTION: This exam was performed according to the departmental dose-optimization program which includes automated exposure control, adjustment of the mA and/or kV according to patient size and/or use of iterative reconstruction technique. CONTRAST:  65mL OMNIPAQUE IOHEXOL 350 MG/ML SOLN COMPARISON:  CT of the chest 03/31/2023 FINDINGS: Cardiovascular: Satisfactory opacification of the pulmonary arteries to the segmental level. No evidence of pulmonary embolism. Normal heart size. No pericardial effusion. There are atherosclerotic calcifications of the aorta and coronary arteries. Mediastinum/Nodes: There is an enlarged subcarinal lymph node measuring 2.3 by 3.1 cm. There is a new enlarged right hilar lymph node measuring 12 mm short axis. This is also unchanged. Visualized esophagus and thyroid gland is within normal limits. Lungs/Pleura: Severe emphysematous changes are again seen. Focal opacity with calcifications in the superior segment of the right lower lobe image 5/63 measures 1.3 x 2.0 cm and appears unchanged. Dense parenchymal opacification in the inferior right lower lobe measures 5.1 by 10.3 cm and has increased in size. Air-fluid levels are seen within bullae posteriorly, new from prior. No pneumothorax or pleural effusion identified. Small calcified density in the left lung base is unchanged. Trachea and central airways are patent. Upper Abdomen: There is a 7 mm  hypodensity in the inferior right lobe of the liver which is too small to characterize. Cholecystectomy clips are present. Left renal cyst is partially imaged measuring 2.1 cm. Musculoskeletal: No chest wall abnormality. No acute or significant osseous findings. Review of the MIP images confirms the above findings. IMPRESSION: 1. No evidence for pulmonary embolism. 2. Interval increase in size of the dense parenchymal opacity in the inferior right lower lobe. Findings are compatible with known malignancy. 3. New air-fluid levels within bullae in the posterior right lower lobe worrisome for infection. 4. Stable enlarged subcarinal and right hilar lymph nodes. 5. Stable calcified density in the superior segment of the right lower lobe. 6. Stable 7  mm hypodensity in the inferior right lobe of the liver, too small to characterize. Aortic Atherosclerosis (ICD10-I70.0) and Emphysema (ICD10-J43.9). Electronically Signed   By: Darliss Cheney M.D.   On: 04/15/2023 17:53   CT ABDOMEN PELVIS W CONTRAST Result Date: 04/15/2023 CLINICAL DATA:  Provided history: Flank pain, no prior imaging (Ped 0-17y) Patient is a 68 year old with right flank EXAM: CT ABDOMEN AND PELVIS WITH CONTRAST TECHNIQUE: Multidetector CT imaging of the abdomen and pelvis was performed using the standard protocol following bolus administration of intravenous contrast. Performed in conjunction with CTA of the chest, reported separately. RADIATION DOSE REDUCTION: This exam was performed according to the departmental dose-optimization program which includes automated exposure control, adjustment of the mA and/or kV according to patient size and/or use of iterative reconstruction technique. CONTRAST:  65mL OMNIPAQUE IOHEXOL 350 MG/ML SOLN COMPARISON:  Abdominopelvic CT 05/20/2021 FINDINGS: Lower chest: Assessed on chest CT, reported separately. Hepatobiliary: Decreased hepatic density typical of steatosis. 9 mm hypodense lesion in the inferior right lobe  series 3, image 31, not definitively seen on prior, indeterminate. Clips in the gallbladder fossa postcholecystectomy. No biliary dilatation. Pancreas: No evident inflammation allowing for motion artifact through the pancreas. Mild fatty atrophy Spleen: Normal in size without focal abnormality. Adrenals/Urinary Tract: Normal adrenal glands. No hydronephrosis or perinephric edema. Homogeneous renal enhancement with symmetric excretion on delayed phase imaging. Left renal cysts. No further follow-up imaging is recommended. No renal calculi. Urinary bladder is partially distended without wall thickening. Stomach/Bowel: There is motion artifact in the upper abdomen which limits upper abdominal assessment. Ingested material within the stomach. No small bowel obstruction or inflammatory change. Normal appendix. Small to moderate colonic stool burden. Sigmoid colonic diverticula without diverticulitis. The sigmoid colon is redundant. Vascular/Lymphatic: Advanced aortic atherosclerosis. Bulky calcified plaque in the distal abdominal aorta causes severe narrowing just above the iliac bifurcation, series 3, image 45. No enlarged lymph nodes in the abdomen or pelvis. Reproductive: Prominent prostate. Other: No free air or ascites. No significant abdominal wall hernia. Musculoskeletal: The bones are subjectively under mineralized. There are no acute or suspicious osseous abnormalities. IMPRESSION: 1. No acute abnormality in the abdomen/pelvis. No renal calculi or obstructive uropathy. 2. Hepatic steatosis. 9 mm hypodense lesion in the inferior right lobe, not definitively seen on prior, indeterminate. Recommend nonemergent liver protocol MRI for further characterization. 3. Advanced aortic atherosclerosis. Bulky plaque in the distal aorta just above the iliac bifurcation causes severe luminal narrowing. Electronically Signed   By: Narda Rutherford M.D.   On: 04/15/2023 17:52    Procedures Procedures   Medications Ordered  in ED Medications  ipratropium-albuterol (DUONEB) 0.5-2.5 (3) MG/3ML nebulizer solution 3 mL (has no administration in time range)  oxyCODONE (Oxy IR/ROXICODONE) immediate release tablet 5 mg (has no administration in time range)  naloxone (NARCAN) injection 0.4 mg (has no administration in time range)  cefTRIAXone (ROCEPHIN) 1 g in sodium chloride 0.9 % 100 mL IVPB (has no administration in time range)  azithromycin (ZITHROMAX) tablet 500 mg (has no administration in time range)  methylPREDNISolone sodium succinate (SOLU-MEDROL) 125 mg/2 mL injection 120 mg (has no administration in time range)  budesonide (PULMICORT) nebulizer solution 0.25 mg (has no administration in time range)  albuterol (PROVENTIL) (2.5 MG/3ML) 0.083% nebulizer solution 2.5 mg (has no administration in time range)  dextromethorphan-guaiFENesin (MUCINEX DM) 30-600 MG per 12 hr tablet 1 tablet (has no administration in time range)  enoxaparin (LOVENOX) injection 40 mg (has no administration in time range)  acetaminophen (TYLENOL) tablet  650 mg (has no administration in time range)    Or  acetaminophen (TYLENOL) suppository 650 mg (has no administration in time range)  acetaminophen (TYLENOL) tablet 1,000 mg (1,000 mg Oral Given 04/15/23 1503)  oxyCODONE (Oxy IR/ROXICODONE) immediate release tablet 5 mg (5 mg Oral Given 04/15/23 1502)  iohexol (OMNIPAQUE) 350 MG/ML injection 65 mL (65 mLs Intravenous Contrast Given 04/15/23 1643)  ipratropium-albuterol (DUONEB) 0.5-2.5 (3) MG/3ML nebulizer solution 3 mL (3 mLs Nebulization Given 04/15/23 2309)  cefTRIAXone (ROCEPHIN) 1 g in sodium chloride 0.9 % 100 mL IVPB (0 g Intravenous Stopped 04/16/23 0158)  azithromycin (ZITHROMAX) tablet 500 mg (500 mg Oral Given 04/16/23 0200)  lactated ringers bolus 1,000 mL (1,000 mLs Intravenous New Bag/Given 04/16/23 0157)  sodium chloride 0.9 % bolus 500 mL (500 mLs Intravenous New Bag/Given 04/16/23 0158)    ED Course/ Medical Decision Making/  A&P Clinical Course as of 04/16/23 0325  Thu Apr 16, 2023  0104 Wife has requested that the patient have duoneb breathing treatment q4h, even if the patient is sleeping. She would also like the hospitalist to contact her in the morning when rounding.  [RR]    Clinical Course User Index [RR] Achille Rich, PA-C                                Medical Decision Making Amount and/or Complexity of Data Reviewed Labs: ordered.  Risk Prescription drug management. Decision regarding hospitalization.   68 y.o. male presents to the ER for evaluation of SOB/flanmk pain. Differential diagnosis includes but is not limited to CHF, pericardial effusion/tamponade, arrhythmias, ACS, COPD, asthma, bronchitis, pneumonia, pneumothorax, PE, anemia, AAA, renal artery/vein embolism/thrombosis, mesenteric ischemia, pyelonephritis, nephrolithiasis, cystitis, biliary colic, pancreatitis, perforated peptic ulcer, appendicitis, diverticulitis, bowel obstruction. Vital signs unremarkable. Physical exam as noted above.   Labs and imaging workup initiated in triage.  I independently reviewed and interpreted the patient's labs.  CBC shows mildly elevated leukocytosis with a left shift.  Platelets of 466.  No anemia.  Lipase within normal limits.  CMP shows mild hyponatremia 133, chloride 93, glucose at 117.  Mildly decreased albumin 2.5.  ALT of 45.  Otherwise no other electrolyte or LFT abnormalities.  Lactic acid mildly elevated 2.0.  CT abd pelvis 1. No acute abnormality in the abdomen/pelvis. No renal calculi or obstructive uropathy. 2. Hepatic steatosis. 9 mm hypodense lesion in the inferior right lobe, not definitively seen on prior, indeterminate. Recommend nonemergent liver protocol MRI for further characterization. 3. Advanced aortic atherosclerosis. Bulky plaque in the distal aorta just above the iliac bifurcation causes severe luminal narrowing. Per radiologist's interpretation.    CT chest angio 1. No evidence  for pulmonary embolism. 2. Interval increase in size of the dense parenchymal opacity in the inferior right lower lobe. Findings are compatible with known malignancy. 3. New air-fluid levels within bullae in the posterior right lower lobe worrisome for infection. 4. Stable enlarged subcarinal and right hilar lymph nodes. 5. Stable calcified density in the superior segment of the right lower lobe. 6. Stable 7 mm hypodensity in the inferior right lobe of the liver, too small to characterize.  CT lumbar 1. No acute osseous abnormality. 2. Mild-to-moderate spinal and neural foraminal stenosis at L5-S1.   Patient is likely having pain in the right flank but this is where his known malignancy is.  There is no overlying skin changes.  Is nontender to palpation.  His abdomen is soft  and nontender.  On ambulation, he does maintain his O2 saturations but does feel short of breath.  She is request the patient receive every 4 DuoNeb treatments even if the patient is sleeping.  She would also like for the hospitalist to call her in the morning.  CT does not show any signs of PE however there is a new air-fluid level within the bullae worrisome for infection.  He does have a slight leukocytosis but given his significant medical history, as well as recently doxycycline and still has this infection, I do feel admission is needed for further IV antibiotics.  I have ordered IV Rocephin and azithromycin.  Portions of this report may have been transcribed using voice recognition software. Every effort was made to ensure accuracy; however, inadvertent computerized transcription errors may be present.   Final Clinical Impression(s) / ED Diagnoses Final diagnoses:  None    Rx / DC Orders ED Discharge Orders     None         Achille Rich, PA-C 04/16/23 5366    Shon Baton, MD 04/17/23 [phone]

## 2023-04-16 NOTE — Progress Notes (Addendum)
Brief same day note:  Patient is a 68 year old male with history of COPD, right lower lobe lung mass status post SBRT, chronic hypoxic respiratory failure on 2 L of oxygen at home, former tobacco use, hypertension, hyperlipidemia, GERD, BPH who presented with right-sided low back/flank pain, cough.  On presentation,, he was on baseline oxygen requirement of 2 L.  Lab work showed WC count of 13, lactate of 2.  CTA chest negative for PE, showed interval increase in the size of the known right lower lobe mass but also showed posterior right lower lobe air-fluid level consistent for infection.  Patient was admitted for the management of community-acquired pneumonia, COPD exacerbation.  Patient seen and examined at bedside today.  He was on 2 to 3 L of oxygen per minute.  Does not look dyspneic.  Alert and oriented but weak and deconditioned   Assessment and plan:  Community-acquired pneumonia: Patient with worsening cough, elevated white cell count, elevated lactate.  No history of fever .CT chest showed new fluid level within the bullae in the posterior right lower lobe worrisome for infection.  Continue current antibiotic  Chronic hypoxic respiratory failure: On 2 L of oxygen at home, currently on the same  COPD exacerbation: Secondary to pneumonia.  Continue bronchodilators, supplemental oxygen, steroids, mucolytics.  No wheezing this morning  Right upper quadrant pain: Resolved.  This is likely from the right lower lobe pneumonia.  No elevated liver enzymes, CT abdomen/pelvis negative for renal stones but also showed hepatic steatosis, hypodense lesion in the right inferior lobe of liver.  Right upper quadrant is nontender  Right lower lobe pneumonia: Follows with pulmonology, radiation oncology.  CT chest showed increase in the size of dense parenchymal opacity in the inferior right lower lobe compatible with no pneumonia.  Needs to follow-up a with pulmonology, radiation oncology as an  outpatient.He follows [facility],we recommend outpatient follow up  Liver lesion: CT abdomen/pelvis showed showing hepatic steatosis with a 9 mm hypodense lesion in the inferior right lobe of the liver, not definitely seen on prior, indeterminate. Radiology recommending nonemergent liver protocol MRI for further characterization.  Patient unable to tolerate MRI due to claustrophobia  Advanced aortic atherosclerosis: CT showing bulky plaque in the distal aorta.  No signs of limb ischemia.  Needs outpatient follow-up with vascular surgery  Hypertension: Takes amlodipine, Coreg at home.  This medicines on hold.  Currently blood pressure stable  Hyperlipidemia: On statin  Debility/deconditioning: PT/OT consulted.  Patient ambulates with the help of walker.  Lives with her sister  I called and discussed with  her sister  Blake Mcknight for update on 1/30

## 2023-04-16 NOTE — Progress Notes (Signed)
Patient transferred from the ED at 1620 pm. Alert. On 2L [place] O2. Connected to the cardiac monitor box 13. Droplet precaution initiated. Will continue to monitor.

## 2023-04-16 NOTE — ED Notes (Signed)
Pt ambulated to the bathroom with tech.

## 2023-04-16 NOTE — H&P (Signed)
History and Physical    Blake Mcknight ZOX:[id number] DOB: 06/07/1955 DOA: 04/15/2023  PCP: Corine Shelter, MD  Patient coming from: Home  Chief Complaint: Right-sided low back/flank pain  HPI: Blake Mcknight is a 68 y.o. male with medical history significant of severe COPD, right lower lobe lung mass/presumed lung cancer status post empiric SBRT, chronic hypoxemic respiratory failure on 2 L home oxygen, former tobacco abuse, hypertension, hyperlipidemia, schizophrenia, GERD, BPH, history of cholecystectomy.  Patient was recently seen by pulmonology and prescribed doxycycline for COPD exacerbation plus or minus pneumonia.  Patient presents to the ED tonight with complaints of right-sided low back/flank pain and ongoing cough.  Satting well on 2 L [place].  Afebrile.  Labs notable for WBC count 13.0, platelet count 466k, sodium 133, chloride 93, glucose 117, albumin 2.5, normal lipase and no significant elevation of LFTs, UA pending, lactic acid 2.0.  CTA chest negative for PE but showing interval increase in size of the dense parenchymal opacity in the inferior right lower lobe compatible with known malignancy.  Also showing new air-fluid levels within bullae in the posterior right lower lobe worrisome for infection.  CT lumbar spine negative for acute osseous abnormality.  CT abdomen/pelvis negative for renal calculi/obstructive uropathy or any other acute abnormality in the abdomen or pelvis.  Showing hepatic steatosis with a 9 mm hypodense lesion in the inferior right lobe of the liver, not definitely seen on prior, indeterminate.  Radiologist recommending nonemergent liver protocol MRI for further characterization.  In addition, showing advanced aortic atherosclerosis with bulky plaque in the distal aorta just above the iliac bifurcation causing severe luminal narrowing. Patient was given Tylenol, azithromycin, DuoNeb x 2, Oxy IR, ceftriaxone, and 1.5 L IV fluids in the ED.  [facility] called to  admit.  History provided to patient and his sister at bedside.  For the past few days he has been complaining of right upper quadrant abdominal pain which is worse whenever he coughs or moves.  Patient denies any pain in his back or flank region.  No rash noticed in this region.  Sister states patient was prescribed doxycycline 10 days ago for pneumonia which he has been taking but still having productive cough and wheezing.  He has been receiving Breztri twice daily and DuoNeb every 4 hours at home.  He is chronically on 2 L home oxygen and no change in oxygen requirement from baseline.  No fevers or chills reported.  Patient denies chest pain, nausea, or vomiting.  Sister states she has been giving him MiraLAX at home and he is having regular bowel movements.  Patient denies any leg pain or claudication symptoms.  Review of Systems:  Review of Systems  All other systems reviewed and are negative.   Past Medical History:  Diagnosis Date   COPD (chronic obstructive pulmonary disease) (HCC)    with bullous emphysema.    Heavy cigarette smoker before 2003   pt claims only 10 cigs per day, never heavier amounts.    HLD (hyperlipidemia)    Hypertension    Schizophrenia (HCC) 06/28/2013   This is a chronic condition and he lives with family    Past Surgical History:  Procedure Laterality Date   CHOLECYSTECTOMY N/A 06/06/2013   Procedure: LAPAROSCOPIC CHOLECYSTECTOMY WITH ATTEMPTED INTRAOPERATIVE CHOLANGIOGRAM;  Surgeon: Liz Malady, MD;  Location: [facility] OR;  Service: General;  Laterality: N/A;   ERCP N/A 06/03/2013   Procedure: ENDOSCOPIC RETROGRADE CHOLANGIOPANCREATOGRAPHY (ERCP);  Surgeon: Meryl Dare, MD;  Location:  [facility] ENDOSCOPY;  Service: Endoscopy;  Laterality: N/A;     reports that he has quit smoking. His smoking use included cigarettes. He has a 41 pack-year smoking history. He has never used smokeless tobacco. He reports that he does not drink alcohol and does not use  drugs.  No Known Allergies  Family History  Problem Relation Age of Onset   Diabetes Mellitus II Mother    Diabetes Mother    Heart disease Father    Stroke Maternal Aunt    CAD Neg Hx     Prior to Admission medications   Medication Sig Start Date End Date Taking? Authorizing Provider  albuterol (VENTOLIN HFA) 108 (90 Base) MCG/ACT inhaler Inhale 2 puffs into the lungs every 4 (four) hours as needed for wheezing or shortness of breath. 02/20/19   Glade Lloyd, MD  albuterol (VENTOLIN HFA) 108 (90 Base) MCG/ACT inhaler Inhale 2 puffs into the lungs every 4 (four) hours as needed for wheezing or shortness of breath. 10/02/22   Cathren Laine, MD  amLODipine (NORVASC) 10 MG tablet Take 10 mg by mouth daily. 06/26/21   [provider]  atorvastatin (LIPITOR) 10 MG tablet Take 10 mg by mouth at bedtime. 09/21/15   [provider]  benzonatate (TESSALON) 200 MG capsule Take 1 capsule (200 mg total) by mouth 2 (two) times daily as needed for cough. 09/05/20   Narda Bonds, MD  benztropine (COGENTIN) 2 MG tablet Take 1 tablet (2 mg total) by mouth 2 (two) times daily. 03/27/23   Arfeen, Phillips Grout, MD  BREZTRI AEROSPHERE 160-9-4.8 MCG/ACT AERO Inhale 2 puffs into the lungs in the morning and at bedtime. 07/18/20   [provider]  carvedilol (COREG) 3.125 MG tablet Take 1 tablet (3.125 mg total) by mouth 2 (two) times daily with a meal. Patient taking differently: Take 1.5625-3.125 mg by mouth See admin instructions. Take one tablet by mouth daily in the morning and one-half tablet by mouth daily in the evening. 02/20/19   Glade Lloyd, MD  castor oil liquid Take 10 mLs by mouth daily as needed for moderate constipation or mild constipation.    [provider]  cetirizine (ZYRTEC) 10 MG tablet Take 10 mg by mouth daily as needed for allergies or rhinitis.    [provider]  cholecalciferol (VITAMIN D) 25 MCG (1000 UNIT) tablet Take 4,000 Units by mouth every  evening.     [provider]  famotidine (PEPCID) 40 MG tablet Take 40 mg by mouth 2 (two) times daily. 12/28/18   [provider]  ipratropium-albuterol (DUONEB) 0.5-2.5 (3) MG/3ML SOLN Take 3 mLs by nebulization every 4 (four) hours. 04/27/21   [provider]  MILK THISTLE PO Take 1 capsule by mouth daily.    [provider]  mirtazapine (REMERON) 30 MG tablet Take 1 tablet (30 mg total) by mouth at bedtime as needed (insomnia). 03/27/23 03/26/24  Arfeen, Phillips Grout, MD  multivitamin (ONE-A-DAY MEN'S) TABS tablet Take 1 tablet by mouth daily.    [provider]  OXYGEN Inhale 2 L/min into the lungs continuous.     [provider]  polyethylene glycol (MIRALAX / GLYCOLAX) 17 g packet Take 17 g by mouth daily as needed for mild constipation.    [provider]  risperiDONE (RISPERDAL) 3 MG tablet Take 1 tablet (3 mg total) by mouth 2 (two) times daily. 03/27/23   Arfeen, Phillips Grout, MD  sodium chloride (OCEAN) 0.65 % SOLN nasal spray Place  1 spray into both nostrils as needed for congestion.    [provider]  tamsulosin (FLOMAX) 0.4 MG CAPS capsule Take 0.8 mg by mouth at bedtime. 07/23/20   [provider]    Physical Exam: Vitals:   04/15/23 1813 04/15/23 2118 04/15/23 2344 04/16/23 0140  BP: 109/74 112/76  102/66  Pulse: 91 88  96  Resp: 20 18  16   Temp:  98.1 F (36.7 C)  97.6 F (36.4 C)  TempSrc:    Oral  SpO2: 100% 98% 99% 100%    Physical Exam Vitals reviewed.  Constitutional:      General: He is not in acute distress. HENT:     Head: Normocephalic and atraumatic.  Eyes:     Extraocular Movements: Extraocular movements intact.  Cardiovascular:     Rate and Rhythm: Normal rate and regular rhythm.     Pulses: Normal pulses.  Pulmonary:     Effort: Pulmonary effort is normal. No respiratory distress.     Breath sounds: Wheezing present. No rales.     Comments: Diminished breath sounds with diffuse end  expiratory wheezing Abdominal:     General: Bowel sounds are normal. There is no distension.     Palpations: Abdomen is soft.     Tenderness: There is no abdominal tenderness. There is no guarding or rebound.  Musculoskeletal:     Cervical back: Normal range of motion.     Right lower leg: No edema.     Left lower leg: No edema.  Skin:    General: Skin is warm and dry.     Findings: No rash.  Neurological:     General: No focal deficit present.     Mental Status: He is alert and oriented to person, place, and time.     Labs on Admission: I have personally reviewed following labs and imaging studies  CBC: Recent Labs  Lab 04/15/23 1457  WBC 13.0*  NEUTROABS 10.5*  HGB 13.1  HCT 40.4  MCV 84.0  PLT 466*   Basic Metabolic Panel: Recent Labs  Lab 04/15/23 1457  NA 133*  K 3.6  CL 93*  CO2 27  GLUCOSE 117*  BUN 8  CREATININE 0.68  CALCIUM 9.3   GFR: Estimated Creatinine Clearance: 83.1 mL/min (by C-G formula based on SCr of 0.68 mg/dL). Liver Function Tests: Recent Labs  Lab 04/15/23 1457  AST 33  ALT 45*  ALKPHOS 74  BILITOT 0.4  PROT 7.2  ALBUMIN 2.5*   Recent Labs  Lab 04/15/23 1457  LIPASE 29   No results for input(s): "AMMONIA" in the last 168 hours. Coagulation Profile: No results for input(s): "INR", "PROTIME" in the last 168 hours. Cardiac Enzymes: No results for input(s): "CKTOTAL", "CKMB", "CKMBINDEX", "TROPONINI" in the last 168 hours. BNP (last 3 results) No results for input(s): "PROBNP" in the last 8760 hours. HbA1C: No results for input(s): "HGBA1C" in the last 72 hours. CBG: No results for input(s): "GLUCAP" in the last 168 hours. Lipid Profile: No results for input(s): "CHOL", "HDL", "LDLCALC", "TRIG", "CHOLHDL", "LDLDIRECT" in the last 72 hours. Thyroid Function Tests: No results for input(s): "TSH", "T4TOTAL", "FREET4", "T3FREE", "THYROIDAB" in the last 72 hours. Anemia Panel: No results for input(s): "VITAMINB12", "FOLATE",  "FERRITIN", "TIBC", "IRON", "RETICCTPCT" in the last 72 hours. Urine analysis:    Component Value Date/Time   COLORURINE YELLOW 08/27/2021 0038   APPEARANCEUR CLEAR 08/27/2021 0038   LABSPEC 1.011 08/27/2021 0038   PHURINE 6.0 08/27/2021 0038  GLUCOSEU NEGATIVE 08/27/2021 0038   HGBUR NEGATIVE 08/27/2021 0038   BILIRUBINUR NEGATIVE 08/27/2021 0038   KETONESUR NEGATIVE 08/27/2021 0038   PROTEINUR NEGATIVE 08/27/2021 0038   UROBILINOGEN 0.2 01/29/2019 1634   NITRITE NEGATIVE 08/27/2021 0038   LEUKOCYTESUR NEGATIVE 08/27/2021 0038    Radiological Exams on Admission: CT L-SPINE NO CHARGE Result Date: 04/15/2023 CLINICAL DATA:  Right flank pain.  History of lung cancer. EXAM: CT LUMBAR SPINE WITH CONTRAST TECHNIQUE: Technique: Multiplanar CT images of the lumbar spine were reconstructed from contemporary CT of the Abdomen and Pelvis. RADIATION DOSE REDUCTION: This exam was performed according to the departmental dose-optimization program which includes automated exposure control, adjustment of the mA and/or kV according to patient size and/or use of iterative reconstruction technique. CONTRAST:  No additional. COMPARISON:  CT abdomen and pelvis 05/22/2021 FINDINGS: Segmentation: Transitional lumbosacral anatomy with partially lumbarized S1. Alignment: Normal. Vertebrae: No acute fracture or suspicious osseous lesion. Paraspinal and other soft tissues: No acute abnormality in the paraspinal soft tissues. Remainder of the abdomen and pelvis reported separately. Disc levels: Preserved disc heights throughout the lumbar spine. Mild diffuse mid and lower lumbar spinal canal narrowing due to congenitally short pedicles and mildly prominent epidural fat. At L5-S1, disc bulging, prominent dorsal epidural fat, ligamentum flavum hypertrophy, and moderate to severe right greater than left facet hypertrophy result in mild-to-moderate spinal stenosis and mild right and mild-to-moderate left neural foraminal  stenosis. IMPRESSION: 1. No acute osseous abnormality. 2. Mild-to-moderate spinal and neural foraminal stenosis at L5-S1. Electronically Signed   By: Sebastian Ache M.D.   On: 04/15/2023 17:54   CT Angio Chest PE W and/or Wo Contrast Result Date: 04/15/2023 CLINICAL DATA:  High probability for PE. Diagnosis with right-sided lung cancer. EXAM: CT ANGIOGRAPHY CHEST WITH CONTRAST TECHNIQUE: Multidetector CT imaging of the chest was performed using the standard protocol during bolus administration of intravenous contrast. Multiplanar CT image reconstructions and MIPs were obtained to evaluate the vascular anatomy. RADIATION DOSE REDUCTION: This exam was performed according to the departmental dose-optimization program which includes automated exposure control, adjustment of the mA and/or kV according to patient size and/or use of iterative reconstruction technique. CONTRAST:  65mL OMNIPAQUE IOHEXOL 350 MG/ML SOLN COMPARISON:  CT of the chest 03/31/2023 FINDINGS: Cardiovascular: Satisfactory opacification of the pulmonary arteries to the segmental level. No evidence of pulmonary embolism. Normal heart size. No pericardial effusion. There are atherosclerotic calcifications of the aorta and coronary arteries. Mediastinum/Nodes: There is an enlarged subcarinal lymph node measuring 2.3 by 3.1 cm. There is a new enlarged right hilar lymph node measuring 12 mm short axis. This is also unchanged. Visualized esophagus and thyroid gland is within normal limits. Lungs/Pleura: Severe emphysematous changes are again seen. Focal opacity with calcifications in the superior segment of the right lower lobe image 5/63 measures 1.3 x 2.0 cm and appears unchanged. Dense parenchymal opacification in the inferior right lower lobe measures 5.1 by 10.3 cm and has increased in size. Air-fluid levels are seen within bullae posteriorly, new from prior. No pneumothorax or pleural effusion identified. Small calcified density in the left lung base  is unchanged. Trachea and central airways are patent. Upper Abdomen: There is a 7 mm hypodensity in the inferior right lobe of the liver which is too small to characterize. Cholecystectomy clips are present. Left renal cyst is partially imaged measuring 2.1 cm. Musculoskeletal: No chest wall abnormality. No acute or significant osseous findings. Review of the MIP images confirms the above findings. IMPRESSION: 1. No evidence  for pulmonary embolism. 2. Interval increase in size of the dense parenchymal opacity in the inferior right lower lobe. Findings are compatible with known malignancy. 3. New air-fluid levels within bullae in the posterior right lower lobe worrisome for infection. 4. Stable enlarged subcarinal and right hilar lymph nodes. 5. Stable calcified density in the superior segment of the right lower lobe. 6. Stable 7 mm hypodensity in the inferior right lobe of the liver, too small to characterize. Aortic Atherosclerosis (ICD10-I70.0) and Emphysema (ICD10-J43.9). Electronically Signed   By: Darliss Cheney M.D.   On: 04/15/2023 17:53   CT ABDOMEN PELVIS W CONTRAST Result Date: 04/15/2023 CLINICAL DATA:  Provided history: Flank pain, no prior imaging (Ped 0-17y) Patient is a 68 year old with right flank EXAM: CT ABDOMEN AND PELVIS WITH CONTRAST TECHNIQUE: Multidetector CT imaging of the abdomen and pelvis was performed using the standard protocol following bolus administration of intravenous contrast. Performed in conjunction with CTA of the chest, reported separately. RADIATION DOSE REDUCTION: This exam was performed according to the departmental dose-optimization program which includes automated exposure control, adjustment of the mA and/or kV according to patient size and/or use of iterative reconstruction technique. CONTRAST:  65mL OMNIPAQUE IOHEXOL 350 MG/ML SOLN COMPARISON:  Abdominopelvic CT 05/20/2021 FINDINGS: Lower chest: Assessed on chest CT, reported separately. Hepatobiliary: Decreased  hepatic density typical of steatosis. 9 mm hypodense lesion in the inferior right lobe series 3, image 31, not definitively seen on prior, indeterminate. Clips in the gallbladder fossa postcholecystectomy. No biliary dilatation. Pancreas: No evident inflammation allowing for motion artifact through the pancreas. Mild fatty atrophy Spleen: Normal in size without focal abnormality. Adrenals/Urinary Tract: Normal adrenal glands. No hydronephrosis or perinephric edema. Homogeneous renal enhancement with symmetric excretion on delayed phase imaging. Left renal cysts. No further follow-up imaging is recommended. No renal calculi. Urinary bladder is partially distended without wall thickening. Stomach/Bowel: There is motion artifact in the upper abdomen which limits upper abdominal assessment. Ingested material within the stomach. No small bowel obstruction or inflammatory change. Normal appendix. Small to moderate colonic stool burden. Sigmoid colonic diverticula without diverticulitis. The sigmoid colon is redundant. Vascular/Lymphatic: Advanced aortic atherosclerosis. Bulky calcified plaque in the distal abdominal aorta causes severe narrowing just above the iliac bifurcation, series 3, image 45. No enlarged lymph nodes in the abdomen or pelvis. Reproductive: Prominent prostate. Other: No free air or ascites. No significant abdominal wall hernia. Musculoskeletal: The bones are subjectively under mineralized. There are no acute or suspicious osseous abnormalities. IMPRESSION: 1. No acute abnormality in the abdomen/pelvis. No renal calculi or obstructive uropathy. 2. Hepatic steatosis. 9 mm hypodense lesion in the inferior right lobe, not definitively seen on prior, indeterminate. Recommend nonemergent liver protocol MRI for further characterization. 3. Advanced aortic atherosclerosis. Bulky plaque in the distal aorta just above the iliac bifurcation causes severe luminal narrowing. Electronically Signed   By: Narda Rutherford M.D.   On: 04/15/2023 17:52    Assessment and Plan  Community-acquired pneumonia Failed outpatient antibiotic therapy with doxycycline.  Patient is having ongoing productive cough.  WBC count mildly elevated at 13.0.  Lactate borderline elevated at 2.0.  No fever, tachycardia, tachypnea, or hypotension to suggest sepsis.  No change in oxygen requirement from baseline.  CTA chest negative for PE but showing new air-fluid levels within bullae in the posterior right lower lobe worrisome for infection.  Continue antibiotic therapy with ceftriaxone and azithromycin.  EKG ordered to check QT interval.  Patient was given 1.5 L IV fluids in the ED,  trend lactate.  Also trend WBC count.  Check for COVID and flu/respiratory viral panel.  Severe COPD with acute exacerbation Patient is wheezing on exam.  No respiratory distress or change in oxygen requirement from baseline.  Continue management with Solu-Medrol 60 mg every 12 hours, DuoNeb every 6 hours, Pulmicort neb twice daily, albuterol neb every 2 hours as needed, Mucinex DM twice daily, and flutter valve.  Continue antibiotics as mentioned previously.  Right lower lobe lung mass/presumed lung cancer status post empiric SBRT CT chest showing interval increase in size of the dense parenchymal opacity in the inferior right lower lobe compatible with known malignancy.  Patient will need outpatient follow-up with pulmonology and radiation oncology.  Chronic hypoxemic respiratory failure on 2 L home oxygen Stable, no change in oxygen requirement from baseline.  Right upper quadrant abdominal pain At this time, patient is complaining of right upper quadrant abdominal pain and denies any pain in his flank region or back.  No rash or obvious signs of shingles.  Abdominal exam benign.  Normal lipase and no significant elevation of LFTs.  UA pending.  CT lumbar spine negative for acute osseous abnormality. CT abdomen/pelvis negative for renal  calculi/obstructive uropathy or any other acute abnormality in the abdomen or pelvis.  Showing hepatic steatosis with a 9 mm hypodense lesion in the inferior right lobe of the liver, not definitely seen on prior, indeterminate.  Radiologist recommending nonemergent liver protocol MRI for further characterization.  I discussed ordering MRI with the patient's sister who is concerned that patient will not be able to tolerate MRI given his underlying schizophrenia/anxiety/extreme claustrophobia.  Discussed giving Ativan for anxiety/sedation prior to MRI but patient's sister feels it might be more appropriate for the patient to get an open MRI in the outpatient setting.  She is requesting for MRI not to be ordered at this time and wants to discuss further when she returns to the hospital in the morning.  Continue pain management at this time.  Advanced aortic arthrosclerosis CT showing advanced aortic atherosclerosis with bulky plaque in the distal aorta just above the iliac bifurcation causing severe luminal narrowing.  Patient denies any leg pain or claudication symptoms.  No signs of acute limb ischemia.  Patient will need outpatient vascular surgery follow-up.  Hypertension: Stable. Hyperlipidemia Schizophrenia GERD BPH Unable to safely ordered home medications at this time as pharmacy medication reconciliation is pending.  DVT prophylaxis: SCDs Code Status: DNR/DNI (discussed with the patient and his sister) Family Communication: Sister at bedside. Level of care: Telemetry bed Admission status: It is my clinical opinion that referral for OBSERVATION is reasonable and necessary in this patient based on the above information provided. The aforementioned taken together are felt to place the patient at high risk for further clinical deterioration. However, it is anticipated that the patient may be medically stable for discharge from the hospital within 24 to 48 hours.  John Giovanni MD [facility]  If 7PM-7AM, please contact night-coverage [email or web address]  04/16/2023, 2:01 AM

## 2023-04-16 NOTE — Plan of Care (Signed)
Problem: Education: Goal: Knowledge of General Education information will improve Description: Including pain rating scale, medication(s)/side effects and non-pharmacologic comfort measures Outcome: Progressing   Problem: Activity: Goal: Risk for activity intolerance will decrease Outcome: Progressing   Problem: Nutrition: Goal: Adequate nutrition will be maintained Outcome: Progressing   Problem: Coping: Goal: Level of anxiety will decrease Outcome: Progressing   Problem: Elimination: Goal: Will not experience complications related to urinary retention Outcome: Progressing   Problem: Safety: Goal: Ability to remain free from injury will improve Outcome: Progressing   Problem: Skin Integrity: Goal: Risk for impaired skin integrity will decrease Outcome: Progressing

## 2023-04-17 ENCOUNTER — Other Ambulatory Visit: Payer: Self-pay

## 2023-04-17 ENCOUNTER — Other Ambulatory Visit (HOSPITAL_COMMUNITY): Payer: Self-pay

## 2023-04-17 DIAGNOSIS — J189 Pneumonia, unspecified organism: Secondary | ICD-10-CM | POA: Diagnosis not present

## 2023-04-17 LAB — CBC
HCT: 33.2 % — ABNORMAL LOW (ref 39.0–52.0)
Hemoglobin: 11.2 g/dL — ABNORMAL LOW (ref 13.0–17.0)
MCH: 27.5 pg (ref 26.0–34.0)
MCHC: 33.7 g/dL (ref 30.0–36.0)
MCV: 81.4 fL (ref 80.0–100.0)
Platelets: 374 10*3/uL (ref 150–400)
RBC: 4.08 MIL/uL — ABNORMAL LOW (ref 4.22–5.81)
RDW: 14.3 % (ref 11.5–15.5)
WBC: 15.9 10*3/uL — ABNORMAL HIGH (ref 4.0–10.5)
nRBC: 0 % (ref 0.0–0.2)

## 2023-04-17 LAB — BASIC METABOLIC PANEL
Anion gap: 11 (ref 5–15)
BUN: 5 mg/dL — ABNORMAL LOW (ref 8–23)
CO2: 24 mmol/L (ref 22–32)
Calcium: 8.5 mg/dL — ABNORMAL LOW (ref 8.9–10.3)
Chloride: 97 mmol/L — ABNORMAL LOW (ref 98–111)
Creatinine, Ser: 0.65 mg/dL (ref 0.61–1.24)
GFR, Estimated: >60 mL/min (ref >60)
Glucose, Bld: 124 mg/dL — ABNORMAL HIGH (ref 70–99)
Potassium: 3.2 mmol/L — ABNORMAL LOW (ref 3.5–5.1)
Sodium: 132 mmol/L — ABNORMAL LOW (ref 135–145)

## 2023-04-17 MED ORDER — POTASSIUM CHLORIDE CRYS ER 20 MEQ PO TBCR
40.0000 meq | EXTENDED_RELEASE_TABLET | Freq: Every day | ORAL | 0 refills | Status: DC
  Filled 2023-04-17: qty 6, 3d supply, fill #0

## 2023-04-17 MED ORDER — AZITHROMYCIN 500 MG PO TABS
500.0000 mg | ORAL_TABLET | Freq: Every day | ORAL | 0 refills | Status: AC
  Filled 2023-04-17: qty 3, 3d supply, fill #0

## 2023-04-17 MED ORDER — CEFDINIR 300 MG PO CAPS
300.0000 mg | ORAL_CAPSULE | Freq: Two times a day (BID) | ORAL | 0 refills | Status: AC
  Filled 2023-04-17: qty 8, 4d supply, fill #0

## 2023-04-17 MED ORDER — PREDNISONE 20 MG PO TABS
40.0000 mg | ORAL_TABLET | Freq: Every day | ORAL | 0 refills | Status: AC
  Filled 2023-04-17: qty 6, 3d supply, fill #0

## 2023-04-17 MED ORDER — POTASSIUM CHLORIDE CRYS ER 20 MEQ PO TBCR
40.0000 meq | EXTENDED_RELEASE_TABLET | Freq: Once | ORAL | Status: AC
  Administered 2023-04-17: 40 meq via ORAL
  Filled 2023-04-17: qty 2

## 2023-04-17 NOTE — Progress Notes (Addendum)
Transition of Care [facility]) - Inpatient Brief Assessment   Patient Details  Name: Blake Mcknight MRN: [id number] Date of Birth: [birth year]  Transition [facility] Care [facility] CM/SW Contact:    Janae Bridgeman, RN Phone Number: 04/17/2023, 11:09 AM   Clinical Narrative: CM met with the patient at the bedside prior to discharge to home today.  Patient admitted for COPD and remains on oxygen - patient has home oxygen.  Patient was offered Medicare choice regarding home health services and he declines home health services and plans to return home with his sister who assists with care.  No other TOC needs at this time.  I called and spoke with the sister by phone and she confirmed that she would not need home health services.   Transition of Care Asessment: Insurance and Status: (P) Insurance coverage has been reviewed Patient has primary care physician: (P) Yes Home environment has been reviewed: (P) from home with sister Prior level of function:: (P) family assistance Prior/Current Home Services: (P) No current home services Social Drivers of Health Review: (P) SDOH reviewed interventions complete Readmission risk has been reviewed: (P) Yes Transition of care needs: (P) transition of care needs identified, TOC will continue to follow

## 2023-04-17 NOTE — Discharge Summary (Addendum)
Physician Discharge Summary  Blake Mcknight ZOX:[id number] DOB: 1955/03/22 DOA: 04/15/2023  PCP: Corine Shelter, MD  Admit date: 04/15/2023 Discharge date: 04/17/2023  Admitted From: Home Disposition:  Home  Discharge Condition:Stable CODE STATUS: DNR Diet recommendation: Heart Healthy  Brief/Interim Summary: Patient is a 68 year old male with history of COPD, right lower lobe lung mass status post SBRT, chronic hypoxic respiratory failure on 2 L of oxygen at home, former tobacco use, hypertension, hyperlipidemia, GERD, BPH who presented with right-sided low back/flank pain, cough.  On presentation,, he was on baseline oxygen requirement of 2 L.  Lab work showed WC count of 13, lactate of 2.  CTA chest negative for PE, showed interval increase in the size of the known right lower lobe mass but also showed posterior right lower lobe air-fluid level consistent for infection.  Patient was admitted for the management of community-acquired pneumonia, COPD exacerbation. He was started on antibiotics, steroids.  This morning he is very comfortable, on his oxygen requirement at baseline.  No complaint of shortness of breath or worsening cough.  Lungs are mostly clear on auscultation.  Medically stable for discharge home today.  PT recommend home health  Following problems were addressed during the hospitalization:  Community-acquired pneumonia: Patient with worsening cough, elevated white cell count, elevated lactate.  No history of fever .CT chest showed new fluid level within the bullae in the posterior right lower lobe worrisome for infection.  Clinically improved significantly.  Antibiotics changed to oral.  Mild leukocytosis is  from steroids  Chronic hypoxic respiratory failure: On 2 L of oxygen at home, currently on the same   COPD exacerbation: Secondary to pneumonia.  Continue bronchodilators, supplemental oxygen, steroids.  No wheezing this morning   Right upper quadrant pain:  Resolved.  This is likely from the right lower lobe pneumonia.  No elevated liver enzymes, CT abdomen/pelvis negative for renal stones but also showed hepatic steatosis, hypodense lesion in the right inferior lobe of liver.  Right upper quadrant is nontender   Right lower lobe pneumonia: Follows with pulmonology, radiation oncology.  CT chest showed increase in the size of dense parenchymal opacity in the inferior right lower lobe compatible with no pneumonia.  Needs to follow-up a with pulmonology, radiation oncology as an outpatient.He follows [facility],we recommend outpatient follow up   Liver lesion: CT abdomen/pelvis showed showing hepatic steatosis with a 9 mm hypodense lesion in the inferior right lobe of the liver, not definitely seen on prior, indeterminate. Radiology recommending nonemergent liver protocol MRI for further characterization.  Patient unable to tolerate MRI due to claustrophobia   Advanced aortic atherosclerosis: CT showing bulky plaque in the distal aorta.  No signs of limb ischemia.  Needs outpatient follow-up with vascular surgery, follow-up with PCP for referral   Hypertension: Takes amlodipine, Coreg at home.  Can resume on discharge  Hyperlipidemia: On statin  Hypokalemia: Supplemented   Debility/deconditioning:  Patient ambulates with the help of walker.  Lives with her sister.  PT recommend home health   Discharge Diagnoses:  Principal Problem:   CAP (community acquired pneumonia) Active Problems:   Chronic hypoxemic respiratory failure (HCC)   COPD with acute exacerbation (HCC)   Malignant neoplasm of lower lobe of right lung (HCC)   Right upper quadrant abdominal pain    Discharge Instructions  Discharge Instructions     Diet - low sodium heart healthy   Complete by: As directed    Discharge instructions   Complete by: As directed  1)Please take prescribed medications as instructed 2)Follow up with your PCP in a week 3)Follow up with home health    Increase activity slowly   Complete by: As directed       Allergies as of 04/17/2023   No Known Allergies      Medication List     TAKE these medications    albuterol 108 (90 Base) MCG/ACT inhaler Commonly known as: VENTOLIN HFA Inhale 2 puffs into the lungs every 4 (four) hours as needed for wheezing or shortness of breath.   amLODipine 10 MG tablet Commonly known as: NORVASC Take 10 mg by mouth daily.   atorvastatin 10 MG tablet Commonly known as: LIPITOR Take 10 mg by mouth at bedtime.   azithromycin 500 MG tablet Commonly known as: ZITHROMAX Take 1 tablet (500 mg total) by mouth daily for 3 days. Start taking on: April 18, 2023   benztropine 2 MG tablet Commonly known as: COGENTIN Take 1 tablet (2 mg total) by mouth 2 (two) times daily.   Breztri Aerosphere 160-9-4.8 MCG/ACT Aero Generic drug: Budeson-Glycopyrrol-Formoterol Inhale 2 puffs into the lungs in the morning and at bedtime.   carvedilol 3.125 MG tablet Commonly known as: COREG Take 1 tablet (3.125 mg total) by mouth 2 (two) times daily with a meal.   castor oil liquid Take 10 mLs by mouth daily as needed for moderate constipation or mild constipation.   cefdinir 300 MG capsule Commonly known as: OMNICEF Take 1 capsule (300 mg total) by mouth 2 (two) times daily for 4 days.   cetirizine 10 MG tablet Commonly known as: ZYRTEC Take 10 mg by mouth daily as needed for allergies or rhinitis.   cholecalciferol 25 MCG (1000 UNIT) tablet Commonly known as: VITAMIN D3 Take 4,000 Units by mouth every evening.   famotidine 40 MG tablet Commonly known as: PEPCID Take 40 mg by mouth 2 (two) times daily.   ipratropium-albuterol 0.5-2.5 (3) MG/3ML Soln Commonly known as: DUONEB Take 3 mLs by nebulization every 4 (four) hours.   Januvia 100 MG tablet Generic drug: sitaGLIPtin Take 100 mg by mouth daily.   MILK THISTLE PO Take 1 capsule by mouth daily.   mirtazapine 30 MG tablet Commonly  known as: REMERON Take 1 tablet (30 mg total) by mouth at bedtime as needed (insomnia).   multivitamin Tabs tablet Take 1 tablet by mouth daily.   OXYGEN Inhale 2 L/min into the lungs continuous.   polyethylene glycol 17 g packet Commonly known as: MIRALAX / GLYCOLAX Take 17 g by mouth daily as needed for mild constipation.   potassium chloride SA 20 MEQ tablet Commonly known as: KLOR-CON M Take 2 tablets (40 mEq total) by mouth daily. Start taking on: April 18, 2023   predniSONE 20 MG tablet Commonly known as: DELTASONE Take 2 tablets (40 mg total) by mouth daily with breakfast for 3 days. Start taking on: April 18, 2023 What changed:  medication strength how much to take when to take this   risperiDONE 3 MG tablet Commonly known as: RISPERDAL Take 1 tablet (3 mg total) by mouth 2 (two) times daily.   sodium chloride 0.65 % Soln nasal spray Commonly known as: OCEAN Place 1 spray into both nostrils as needed for congestion.   tamsulosin 0.4 MG Caps capsule Commonly known as: FLOMAX Take 0.4 mg by mouth in the morning and at bedtime.        Follow-up Information     Corine Shelter, MD. Schedule an appointment as  soon as possible for a visit in 1 week(s).   Specialty: Pulmonary Disease Contact information: [address] [place] [address] [phone]                No Known Allergies  Consultations: None   Procedures/Studies: CT L-SPINE NO CHARGE Result Date: 04/15/2023 CLINICAL DATA:  Right flank pain.  History of lung cancer. EXAM: CT LUMBAR SPINE WITH CONTRAST TECHNIQUE: Technique: Multiplanar CT images of the lumbar spine were reconstructed from contemporary CT of the Abdomen and Pelvis. RADIATION DOSE REDUCTION: This exam was performed according to the departmental dose-optimization program which includes automated exposure control, adjustment of the mA and/or kV according to patient size and/or use of iterative reconstruction  technique. CONTRAST:  No additional. COMPARISON:  CT abdomen and pelvis 05/22/2021 FINDINGS: Segmentation: Transitional lumbosacral anatomy with partially lumbarized S1. Alignment: Normal. Vertebrae: No acute fracture or suspicious osseous lesion. Paraspinal and other soft tissues: No acute abnormality in the paraspinal soft tissues. Remainder of the abdomen and pelvis reported separately. Disc levels: Preserved disc heights throughout the lumbar spine. Mild diffuse mid and lower lumbar spinal canal narrowing due to congenitally short pedicles and mildly prominent epidural fat. At L5-S1, disc bulging, prominent dorsal epidural fat, ligamentum flavum hypertrophy, and moderate to severe right greater than left facet hypertrophy result in mild-to-moderate spinal stenosis and mild right and mild-to-moderate left neural foraminal stenosis. IMPRESSION: 1. No acute osseous abnormality. 2. Mild-to-moderate spinal and neural foraminal stenosis at L5-S1. Electronically Signed   By: Sebastian Ache M.D.   On: 04/15/2023 17:54   CT Angio Chest PE W and/or Wo Contrast Result Date: 04/15/2023 CLINICAL DATA:  High probability for PE. Diagnosis with right-sided lung cancer. EXAM: CT ANGIOGRAPHY CHEST WITH CONTRAST TECHNIQUE: Multidetector CT imaging of the chest was performed using the standard protocol during bolus administration of intravenous contrast. Multiplanar CT image reconstructions and MIPs were obtained to evaluate the vascular anatomy. RADIATION DOSE REDUCTION: This exam was performed according to the departmental dose-optimization program which includes automated exposure control, adjustment of the mA and/or kV according to patient size and/or use of iterative reconstruction technique. CONTRAST:  65mL OMNIPAQUE IOHEXOL 350 MG/ML SOLN COMPARISON:  CT of the chest 03/31/2023 FINDINGS: Cardiovascular: Satisfactory opacification of the pulmonary arteries to the segmental level. No evidence of pulmonary embolism. Normal  heart size. No pericardial effusion. There are atherosclerotic calcifications of the aorta and coronary arteries. Mediastinum/Nodes: There is an enlarged subcarinal lymph node measuring 2.3 by 3.1 cm. There is a new enlarged right hilar lymph node measuring 12 mm short axis. This is also unchanged. Visualized esophagus and thyroid gland is within normal limits. Lungs/Pleura: Severe emphysematous changes are again seen. Focal opacity with calcifications in the superior segment of the right lower lobe image 5/63 measures 1.3 x 2.0 cm and appears unchanged. Dense parenchymal opacification in the inferior right lower lobe measures 5.1 by 10.3 cm and has increased in size. Air-fluid levels are seen within bullae posteriorly, new from prior. No pneumothorax or pleural effusion identified. Small calcified density in the left lung base is unchanged. Trachea and central airways are patent. Upper Abdomen: There is a 7 mm hypodensity in the inferior right lobe of the liver which is too small to characterize. Cholecystectomy clips are present. Left renal cyst is partially imaged measuring 2.1 cm. Musculoskeletal: No chest wall abnormality. No acute or significant osseous findings. Review of the MIP images confirms the above findings. IMPRESSION: 1. No evidence for pulmonary embolism. 2. Interval  increase in size of the dense parenchymal opacity in the inferior right lower lobe. Findings are compatible with known malignancy. 3. New air-fluid levels within bullae in the posterior right lower lobe worrisome for infection. 4. Stable enlarged subcarinal and right hilar lymph nodes. 5. Stable calcified density in the superior segment of the right lower lobe. 6. Stable 7 mm hypodensity in the inferior right lobe of the liver, too small to characterize. Aortic Atherosclerosis (ICD10-I70.0) and Emphysema (ICD10-J43.9). Electronically Signed   By: Darliss Cheney M.D.   On: 04/15/2023 17:53   CT ABDOMEN PELVIS W CONTRAST Result Date:  04/15/2023 CLINICAL DATA:  Provided history: Flank pain, no prior imaging (Ped 0-17y) Patient is a 68 year old with right flank EXAM: CT ABDOMEN AND PELVIS WITH CONTRAST TECHNIQUE: Multidetector CT imaging of the abdomen and pelvis was performed using the standard protocol following bolus administration of intravenous contrast. Performed in conjunction with CTA of the chest, reported separately. RADIATION DOSE REDUCTION: This exam was performed according to the departmental dose-optimization program which includes automated exposure control, adjustment of the mA and/or kV according to patient size and/or use of iterative reconstruction technique. CONTRAST:  65mL OMNIPAQUE IOHEXOL 350 MG/ML SOLN COMPARISON:  Abdominopelvic CT 05/20/2021 FINDINGS: Lower chest: Assessed on chest CT, reported separately. Hepatobiliary: Decreased hepatic density typical of steatosis. 9 mm hypodense lesion in the inferior right lobe series 3, image 31, not definitively seen on prior, indeterminate. Clips in the gallbladder fossa postcholecystectomy. No biliary dilatation. Pancreas: No evident inflammation allowing for motion artifact through the pancreas. Mild fatty atrophy Spleen: Normal in size without focal abnormality. Adrenals/Urinary Tract: Normal adrenal glands. No hydronephrosis or perinephric edema. Homogeneous renal enhancement with symmetric excretion on delayed phase imaging. Left renal cysts. No further follow-up imaging is recommended. No renal calculi. Urinary bladder is partially distended without wall thickening. Stomach/Bowel: There is motion artifact in the upper abdomen which limits upper abdominal assessment. Ingested material within the stomach. No small bowel obstruction or inflammatory change. Normal appendix. Small to moderate colonic stool burden. Sigmoid colonic diverticula without diverticulitis. The sigmoid colon is redundant. Vascular/Lymphatic: Advanced aortic atherosclerosis. Bulky calcified plaque in the  distal abdominal aorta causes severe narrowing just above the iliac bifurcation, series 3, image 45. No enlarged lymph nodes in the abdomen or pelvis. Reproductive: Prominent prostate. Other: No free air or ascites. No significant abdominal wall hernia. Musculoskeletal: The bones are subjectively under mineralized. There are no acute or suspicious osseous abnormalities. IMPRESSION: 1. No acute abnormality in the abdomen/pelvis. No renal calculi or obstructive uropathy. 2. Hepatic steatosis. 9 mm hypodense lesion in the inferior right lobe, not definitively seen on prior, indeterminate. Recommend nonemergent liver protocol MRI for further characterization. 3. Advanced aortic atherosclerosis. Bulky plaque in the distal aorta just above the iliac bifurcation causes severe luminal narrowing. Electronically Signed   By: Narda Rutherford M.D.   On: 04/15/2023 17:52   CT Chest Wo Contrast Result Date: 04/08/2023 CLINICAL DATA:  COPD with emphysema and right lower lobe lung mass presumed lung cancer, post treatment with empiric SBRT. Chronic respiratory failure on oxygen. XRT reportedly complete at this time. EXAM: CT CHEST WITHOUT CONTRAST TECHNIQUE: Multidetector CT imaging of the chest was performed following the standard protocol without IV contrast. RADIATION DOSE REDUCTION: This exam was performed according to the departmental dose-optimization program which includes automated exposure control, adjustment of the mA and/or kV according to patient size and/or use of iterative reconstruction technique. COMPARISON:  Last chest series was AP and lateral on 10/09/22,  portable chest 10/02/2022. Comparison is made with prior chest CTs all without contrast dated 10/09/2022, 06/03/2022 and 02/24/2022. FINDINGS: Cardiovascular: The cardiac size is normal. There are three-vessel coronary artery calcifications. No substantial pericardial effusion. Pulmonary arteries and veins are normal caliber. There are patchy aortic calcific  plaques, scattered calcifications in the great vessels, without aneurysm. Mediastinum/Nodes: A subcarinal nodal mass eccentric to the right is unchanged in size at 3.5 x 2.4 cm. A right mid hilar lymph node is 1.1 cm in short axis today, previously 0.8 cm. No other adenopathy is visible without contrast. Axillary regions are clear. Lungs/Pleura: Small amount of retained secretions in both main bronchi with unremarkable trachea. There is severe emphysematous disease throughout the lungs with paraseptal changes predominating, scattered bullous disease. The treated mass in the posterior aspect of the right lower lobe is difficult to distinguish from contiguous airspace disease today, was previously better circumscribed measuring 5.7 x 3.7 cm. Today this is estimated to measure 4.4 x 2.9 cm on 5:94. There are contiguous areas of right lower lobe airspace consolidation, extending above the lesion to the inferior right hilum and out to the lateral periphery of the right lower lobe as well as inferior to the lesion, all with air bronchograms, consistent with XRT related or bacterial pneumonia versus aspiration. The prior studies demonstrated a roughly parallel right middle lobe opacity in the plane of the right lower lobe mass therefore likely in the XRT plane, which is less confluent today although may extend over a larger area, on 5:91 measuring 3.8 x 1.9 cm, previously 2.4 x 1.1 cm. I suspect there is probably adjacent XRT related airspace disease exaggerating the size of this. There is a stable medial lingular nodule measuring 1.4 x 0.7 cm on 5:85. In the superior segment of the right lower lobe there is increasing prominence of a nodule adjacent to dystrophic calcifications, today measuring 1.5 x 1.4 cm on 5:77, was previously 1 x 0.7 cm. This could represent a new neoplasm. The remaining lungs are clear.  There is no pleural effusion. Upper Abdomen: Limited detail due to breathing motion. No obvious acute  abnormality. Status post cholecystectomy. Aortic atherosclerosis. Musculoskeletal: No regional bone metastasis is seen. IMPRESSION: 1. The treated mass in the posterior aspect of the right lower lobe is difficult to distinguish from contiguous airspace disease today, but estimated to measure 4.4 x 2.9 cm, previously 5.7 x 3.7 cm. 2. Contiguous areas of right lower lobe airspace consolidation with air bronchograms, consistent with XRT related or bacterial pneumonia versus aspiration. 3. The prior studies demonstrated a roughly parallel right middle lobe opacity in the plane of the right lower lobe mass therefore likely in the XRT plane, which is less confluent today although may extend over a larger area, measuring 3.8 x 1.9 cm, previously 2.4 x 1.1 cm. I suspect there is probably adjacent XRT related airspace disease exaggerating the size of this. 4. Stable 1.4 x 0.7 cm medial lingular nodule. 5. Increasing prominence of a 1.5 x 1.4 cm nodule in the superior segment of the right lower lobe adjacent to dystrophic calcifications, previously 1 x 0.7 cm. This could represent a new neoplasm. 6. Stable 3.5 x 2.4 cm subcarinal nodal mass eccentric to the right. 7. Increased size of a 1.1 cm right hilar lymph node. 8. Severe paraseptal predominant emphysema. 9. Aortic and coronary artery atherosclerosis. Aortic Atherosclerosis (ICD10-I70.0) and Emphysema (ICD10-J43.9). Electronically Signed   By: Almira Bar M.D.   On: 04/08/2023 02:17  Subjective: Patient seen and examined at bedside today.  He was very comfortable during my evaluation.  On 2 L of oxygen.  Alert and awake.  Denies any worsening shortness of breath or cough.  Feels ready to go home.  Medically stable for discharge today.  I called and discussed with her sister Dedra Skeens to discuss about discharge planning  Discharge Exam: Vitals:   04/17/23 0738 04/17/23 0804  BP: (!) 141/89   Pulse: (!) 105   Resp: 18   Temp: 98 F (36.7 C)   SpO2: 94%  98%   Vitals:   04/17/23 0036 04/17/23 0339 04/17/23 0738 04/17/23 0804  BP: (!) 151/85 127/76 (!) 141/89   Pulse: 92 96 (!) 105   Resp: 18 19 18    Temp: 97.9 F (36.6 C) 98 F (36.7 C) 98 F (36.7 C)   TempSrc:      SpO2: 98% 98% 94% 98%  Weight:      Height:        General: Pt is alert, awake, not in acute distress Cardiovascular: RRR, S1/S2 +, no rubs, no gallops Respiratory: CTA bilaterally, no wheezing, no rhonchi, diminished sounds bilaterally Abdominal: Soft, NT, ND, bowel sounds + Extremities: no edema, no cyanosis    The results of significant diagnostics from this hospitalization (including imaging, microbiology, ancillary and laboratory) are listed below for reference.     Microbiology: Recent Results (from the past 240 hours)  SARS Coronavirus 2 by RT PCR (hospital order, performed in [facility] hospital lab) *cepheid single result test* Anterior Nasal Swab     Status: None   Collection Time: 04/16/23  3:38 AM   Specimen: Anterior Nasal Swab  Result Value Ref Range Status   SARS Coronavirus 2 by RT PCR NEGATIVE NEGATIVE Final    Comment: Performed at [facility], [address] [place], [place] [address]  Respiratory (~20 pathogens) panel by PCR     Status: None   Collection Time: 04/16/23  3:38 AM   Specimen: Nasopharyngeal Swab; Respiratory  Result Value Ref Range Status   Adenovirus NOT DETECTED NOT DETECTED Final   Coronavirus 229E NOT DETECTED NOT DETECTED Final    Comment: (NOTE) The Coronavirus on the Respiratory Panel, DOES NOT test for the novel  Coronavirus (2019 nCoV)    Coronavirus HKU1 NOT DETECTED NOT DETECTED Final   Coronavirus NL63 NOT DETECTED NOT DETECTED Final   Coronavirus OC43 NOT DETECTED NOT DETECTED Final   Metapneumovirus NOT DETECTED NOT DETECTED Final   Rhinovirus / Enterovirus NOT DETECTED NOT DETECTED Final   Influenza A NOT DETECTED NOT DETECTED Final   Influenza B NOT DETECTED NOT DETECTED Final   Parainfluenza  Virus 1 NOT DETECTED NOT DETECTED Final   Parainfluenza Virus 2 NOT DETECTED NOT DETECTED Final   Parainfluenza Virus 3 NOT DETECTED NOT DETECTED Final   Parainfluenza Virus 4 NOT DETECTED NOT DETECTED Final   Respiratory Syncytial Virus NOT DETECTED NOT DETECTED Final   Bordetella pertussis NOT DETECTED NOT DETECTED Final   Bordetella Parapertussis NOT DETECTED NOT DETECTED Final   Chlamydophila pneumoniae NOT DETECTED NOT DETECTED Final   Mycoplasma pneumoniae NOT DETECTED NOT DETECTED Final    Comment: Performed at [facility], [address] [place], [place] [address]     Labs: BNP (last 3 results) No results for input(s): "BNP" in the last 8760 hours. Basic Metabolic Panel: Recent Labs  Lab 04/15/23 1457 04/16/23 0549 04/17/23 0732  NA 133* 133* 132*  K  3.6 3.6 3.2*  CL 93* 95* 97*  CO2 27 24 24   GLUCOSE 117* 153* 124*  BUN 8 8 5*  CREATININE 0.68 0.55* 0.65  CALCIUM 9.3 8.6* 8.5*   Liver Function Tests: Recent Labs  Lab 04/15/23 1457  AST 33  ALT 45*  ALKPHOS 74  BILITOT 0.4  PROT 7.2  ALBUMIN 2.5*   Recent Labs  Lab 04/15/23 1457  LIPASE 29   No results for input(s): "AMMONIA" in the last 168 hours. CBC: Recent Labs  Lab 04/15/23 1457 04/16/23 0549 04/17/23 0732  WBC 13.0* 13.8* 15.9*  NEUTROABS 10.5*  --   --   HGB 13.1 12.0* 11.2*  HCT 40.4 35.6* 33.2*  MCV 84.0 83.6 81.4  PLT 466* 415* 374   Cardiac Enzymes: No results for input(s): "CKTOTAL", "CKMB", "CKMBINDEX", "TROPONINI" in the last 168 hours. BNP: Invalid input(s): "POCBNP" CBG: Recent Labs  Lab 04/16/23 2004  GLUCAP 191*   D-Dimer No results for input(s): "DDIMER" in the last 72 hours. Hgb A1c No results for input(s): "HGBA1C" in the last 72 hours. Lipid Profile No results for input(s): "CHOL", "HDL", "LDLCALC", "TRIG", "CHOLHDL", "LDLDIRECT" in the last 72 hours. Thyroid function studies No results for input(s): "TSH", "T4TOTAL", "T3FREE", "THYROIDAB" in the last  72 hours.  Invalid input(s): "FREET3" Anemia work up No results for input(s): "VITAMINB12", "FOLATE", "FERRITIN", "TIBC", "IRON", "RETICCTPCT" in the last 72 hours. Urinalysis    Component Value Date/Time   COLORURINE YELLOW 04/16/2023 0616   APPEARANCEUR CLEAR 04/16/2023 0616   LABSPEC 1.016 04/16/2023 0616   PHURINE 6.0 04/16/2023 0616   GLUCOSEU 150 (A) 04/16/2023 0616   HGBUR NEGATIVE 04/16/2023 0616   BILIRUBINUR NEGATIVE 04/16/2023 0616   KETONESUR NEGATIVE 04/16/2023 0616   PROTEINUR NEGATIVE 04/16/2023 0616   UROBILINOGEN 0.2 01/29/2019 1634   NITRITE NEGATIVE 04/16/2023 0616   LEUKOCYTESUR NEGATIVE 04/16/2023 0616   Sepsis Labs Recent Labs  Lab 04/15/23 1457 04/16/23 0549 04/17/23 0732  WBC 13.0* 13.8* 15.9*   Microbiology Recent Results (from the past 240 hours)  SARS Coronavirus 2 by RT PCR (hospital order, performed in [facility] hospital lab) *cepheid single result test* Anterior Nasal Swab     Status: None   Collection Time: 04/16/23  3:38 AM   Specimen: Anterior Nasal Swab  Result Value Ref Range Status   SARS Coronavirus 2 by RT PCR NEGATIVE NEGATIVE Final    Comment: Performed at [facility], [address], [place], [place] [address]  Respiratory (~20 pathogens) panel by PCR     Status: None   Collection Time: 04/16/23  3:38 AM   Specimen: Nasopharyngeal Swab; Respiratory  Result Value Ref Range Status   Adenovirus NOT DETECTED NOT DETECTED Final   Coronavirus 229E NOT DETECTED NOT DETECTED Final    Comment: (NOTE) The Coronavirus on the Respiratory Panel, DOES NOT test for the novel  Coronavirus (2019 nCoV)    Coronavirus HKU1 NOT DETECTED NOT DETECTED Final   Coronavirus NL63 NOT DETECTED NOT DETECTED Final   Coronavirus OC43 NOT DETECTED NOT DETECTED Final   Metapneumovirus NOT DETECTED NOT DETECTED Final   Rhinovirus / Enterovirus NOT DETECTED NOT DETECTED Final   Influenza A NOT DETECTED NOT DETECTED Final   Influenza B NOT DETECTED  NOT DETECTED Final   Parainfluenza Virus 1 NOT DETECTED NOT DETECTED Final   Parainfluenza Virus 2 NOT DETECTED NOT DETECTED Final   Parainfluenza Virus 3 NOT DETECTED NOT DETECTED Final   Parainfluenza Virus 4 NOT DETECTED  NOT DETECTED Final   Respiratory Syncytial Virus NOT DETECTED NOT DETECTED Final   Bordetella pertussis NOT DETECTED NOT DETECTED Final   Bordetella Parapertussis NOT DETECTED NOT DETECTED Final   Chlamydophila pneumoniae NOT DETECTED NOT DETECTED Final   Mycoplasma pneumoniae NOT DETECTED NOT DETECTED Final    Comment: Performed at [facility], [address], [place], [place] [address]    Please note: You were cared for by a hospitalist during your hospital stay. Once you are discharged, your primary care physician will handle any further medical issues. Please note that NO REFILLS for any discharge medications will be authorized once you are discharged, as it is imperative that you return to your primary care physician (or establish a relationship with a primary care physician if you do not have one) for your post hospital discharge needs so that they can reassess your need for medications and monitor your lab values.    Time coordinating discharge: 40 minutes  SIGNED:   Burnadette Pop, MD  [facility] 04/17/2023, 10:54 AM Pager [phone]  If 7PM-7AM, please contact night-coverage [email or web address] Password [email or web address]

## 2023-04-17 NOTE — Evaluation (Signed)
Physical Therapy Evaluation Patient Details Name: Blake Mcknight MRN: [id number] DOB: 07-Jan-1956 Today's Date: 04/17/2023  History of Present Illness  68 year old male who presented 1/29 with right-sided low back/flank pain, cough. with history of COPD, right lower lobe lung mass status post SBRT, chronic hypoxic respiratory failure on 2 L of oxygen at home, former tobacco use, hypertension, hyperlipidemia, GERD, BPH.  Clinical Impression  Pt admitted with above diagnosis. A bit of a limited historian, no family present during evaluation, disoriented to month and year but aware of self, location, and most of situation. He states he was getting HHPT PTA and denies any falls in the past 6 months, not using an AD for gait, relatively sedentary lifestyle (but again questionable history provided.) He was able to ambulate at a supervision level on 2L supplemental O2, no assistive device, no LOB, with SpO2 96%, HR up to 115, and very fatigued by end of distance. Would benefit from HHPT at d/c to continue progressing functional capacity and independence. Will follow acutely but adequate for D/c from mobility standpoint when medically ready if sister indeed available to supervise at home due to confusion. Pt currently with functional limitations due to the deficits listed below (see PT Problem List). Pt will benefit from acute skilled PT to increase their independence and safety with mobility to allow discharge.           If plan is discharge home, recommend the following: Assistance with cooking/housework;Direct supervision/assist for medications management;Direct supervision/assist for financial management;Supervision due to cognitive status;Help with stairs or ramp for entrance;Assist for transportation   Can travel by private vehicle        Equipment Recommendations None recommended by PT  Recommendations for Other Services       Functional Status Assessment Patient has had a recent decline in  their functional status and demonstrates the ability to make significant improvements in function in a reasonable and predictable amount of time.     Precautions / Restrictions Precautions Precautions: Fall Precaution Comments: Monitor O2 Restrictions Weight Bearing Restrictions Per Provider Order: No      Mobility  Bed Mobility Overal bed mobility: Independent             General bed mobility comments: no assist with bed mobility    Transfers Overall transfer level: Modified independent Equipment used: Rolling walker (2 wheels), None               General transfer comment: Able to transfer with and without RW, no LOB.    Ambulation/Gait Ambulation/Gait assistance: Supervision Gait Distance (Feet): 150 Feet Assistive device: None, Rolling walker (2 wheels) Gait Pattern/deviations: Step-through pattern, Drifts right/left Gait velocity: dec Gait velocity interpretation: 1.31 - 2.62 ft/sec, indicative of limited community ambulator   General Gait Details: Very mild instability without assistive device, no overt LOB. Cues for awareness, symmetry, and symptom management with pacing and energy conservation. Very fatigued by end of distance, SpO2 96% on 2L supplemental O2. 2/3 dyspnea. HR 115. Recovers upon sitting. Majority of distance without AD, supervision mainly for safety but no assist needed physically.  Stairs            Wheelchair Mobility     Tilt Bed    Modified Rankin (Stroke Patients Only)       Balance Overall balance assessment: Mild deficits observed, not formally tested  Pertinent Vitals/Pain Pain Assessment Pain Assessment: No/denies pain    Home Living Family/patient expects to be discharged to:: Private residence Living Arrangements: Other relatives (sister) Available Help at Discharge: Family;Available 24 hours/day Type of Home: House Home Access: Stairs to  enter Entrance Stairs-Rails: None Entrance Stairs-Number of Steps: 2   Home Layout: One level Home Equipment: Wheelchair - manual      Prior Function Prior Level of Function : Needs assist;Patient poor historian/Family not available             Mobility Comments: not using assistive device ADLs Comments: States sister does cleaning     Extremity/Trunk Assessment   Upper Extremity Assessment Upper Extremity Assessment: Right hand dominant;Defer to OT evaluation    Lower Extremity Assessment Lower Extremity Assessment: Generalized weakness       Communication   Communication Communication: No apparent difficulties  Cognition Arousal: Lethargic Behavior During Therapy: Flat affect Overall Cognitive Status: No family/caregiver present to determine baseline cognitive functioning                                 General Comments: disoriented to month, year; oriented to self, location, and a bit of current situation.        General Comments General comments (skin integrity, edema, etc.): SpO2 at rest 97% on 2L, ambulating 96%. Moderate DOE 2/3 with gait, none at rest. Denies pain/dizziness.    Exercises     Assessment/Plan    PT Assessment Patient needs continued PT services  PT Problem List Decreased strength;Decreased activity tolerance;Decreased balance;Decreased mobility;Decreased cognition;Cardiopulmonary status limiting activity       PT Treatment Interventions DME instruction;Gait training;Stair training;Functional mobility training;Therapeutic activities;Therapeutic exercise;Balance training;Neuromuscular re-education;Patient/family education;Cognitive remediation    PT Goals (Current goals can be found in the Care Plan section)  Acute Rehab PT Goals Patient Stated Goal: Get better, go home with sister. PT Goal Formulation: With patient Time For Goal Achievement: 05/01/23 Potential to Achieve Goals: Good    Frequency Min 1X/week      Co-evaluation               AM-PAC PT "6 Clicks" Mobility  Outcome Measure Help needed turning from your back to your side while in a flat bed without using bedrails?: None Help needed moving from lying on your back to sitting on the side of a flat bed without using bedrails?: None Help needed moving to and from a bed to a chair (including a wheelchair)?: None Help needed standing up from a chair using your arms (e.g., wheelchair or bedside chair)?: None Help needed to walk in hospital room?: A Little Help needed climbing 3-5 steps with a railing? : A Little 6 Click Score: 22    End of Session Equipment Utilized During Treatment: Oxygen Activity Tolerance: Patient tolerated treatment well Patient left: in bed;with call bell/phone within reach;with bed alarm set Nurse Communication: Mobility status PT Visit Diagnosis: Other abnormalities of gait and mobility (R26.89)    Time: 1610-9604 PT Time Calculation (min) (ACUTE ONLY): 25 min   Charges:   PT Evaluation $PT Eval Low Complexity: 1 Low PT Treatments $Gait Training: 8-22 mins PT General Charges $$ ACUTE PT VISIT: 1 Visit         Kathlyn Sacramento, PT, DPT [facility]  Rehabilitation Services Physical Therapist Office: [phone] Website: [email or web address]   Berton Mount 04/17/2023, 9:53 AM

## 2023-04-17 NOTE — Evaluation (Signed)
Occupational Therapy Evaluation and Discharge Patient Details Name: Blake Mcknight MRN: [id number] DOB: 1955/12/21 Today's Date: 04/17/2023   History of Present Illness 67 year old male who presented 1/29 with right-sided low back/flank pain, cough. with history of COPD, right lower lobe lung mass status post SBRT, chronic hypoxic respiratory failure on 2 L of oxygen at home, former tobacco use, hypertension, hyperlipidemia, GERD, BPH.   Clinical Impression   Pt mobilizing with supervision for O2 line/tank on 2L. Reports he is on 2L at home and his sister and "friend" help with bathing, dressing and IADLs. Pt likely very near his baseline. No further OT needs.      If plan is discharge home, recommend the following: A little help with bathing/dressing/bathroom;Assistance with cooking/housework;Direct supervision/assist for medications management;Direct supervision/assist for financial management;Assist for transportation    Functional Status Assessment  Patient has not had a recent decline in their functional status  Equipment Recommendations  None recommended by OT    Recommendations for Other Services       Precautions / Restrictions Precautions Precautions: Fall Precaution Comments: Monitor O2 Restrictions Weight Bearing Restrictions Per Provider Order: No      Mobility Bed Mobility Overal bed mobility: Independent             General bed mobility comments: flat HOB    Transfers Overall transfer level: Modified independent                        Balance Overall balance assessment: Mild deficits observed, not formally tested                                         ADL either performed or assessed with clinical judgement   ADL Overall ADL's : At baseline                                             Vision Ability to See in Adequate Light: 0 Adequate Patient Visual Report: No change from baseline        Perception         Praxis         Pertinent Vitals/Pain Pain Assessment Pain Assessment: No/denies pain     Extremity/Trunk Assessment Upper Extremity Assessment Upper Extremity Assessment: Overall WFL for tasks assessed   Lower Extremity Assessment Lower Extremity Assessment: Defer to PT evaluation   Cervical / Trunk Assessment Cervical / Trunk Assessment: Normal   Communication Communication Communication: No apparent difficulties   Cognition Arousal: Alert Behavior During Therapy: Flat affect Overall Cognitive Status: No family/caregiver present to determine baseline cognitive functioning                                       General Comments       Exercises     Shoulder Instructions      Home Living Family/patient expects to be discharged to:: Private residence Living Arrangements: Other relatives (sister) Available Help at Discharge: Family;Available 24 hours/day Type of Home: House Home Access: Stairs to enter [employer] of Steps: 2 Entrance Stairs-Rails: None Home Layout: One level     Bathroom Shower/Tub: Chief Strategy Officer: Standard  Home Equipment: Wheelchair - manual;Shower seat          Prior Functioning/Environment Prior Level of Function : Needs assist;Patient poor historian/Family not available             Mobility Comments: not using assistive device ADLs Comments: reports "they" help him with bathing, dressing, meal prep and housekeeping        OT Problem List:        OT Treatment/Interventions:      OT Goals(Current goals can be found in the care plan section)    OT Frequency:      Co-evaluation              AM-PAC OT "6 Clicks" Daily Activity     Outcome Measure Help from another person eating meals?: None Help from another person taking care of personal grooming?: A Little Help from another person toileting, which includes using toliet, bedpan, or urinal?: A  Little Help from another person bathing (including washing, rinsing, drying)?: A Little Help from another person to put on and taking off regular upper body clothing?: A Little Help from another person to put on and taking off regular lower body clothing?: A Little 6 Click Score: 19   End of Session Equipment Utilized During Treatment: Gait belt  Activity Tolerance: Patient tolerated treatment well Patient left: in bed;with call bell/phone within reach;with bed alarm set  OT Visit Diagnosis: Other symptoms and signs involving cognitive function                Time: 1191-4782 OT Time Calculation (min): 17 min Charges:  OT General Charges $OT Visit: 1 Visit OT Evaluation $OT Eval Low Complexity: 1 Low  Blake Mcknight, OTR/L Acute Rehabilitation Services Office: [phone]   Blake Mcknight 04/17/2023, 1:00 PM

## 2023-04-17 NOTE — Progress Notes (Signed)
Reviewed AVS, patient expressed understanding of medications, MD follow up reviewed.  Removed IV, Site clean, dry and intact.  Will Pick up medications from TOC pharm, Pt transported to Discharge lounge. Informed D/C lounge TOC meds needs to be picked up for patient.

## 2023-04-25 IMAGING — DX DG CHEST 2V
2 series · 2 of 2 positions shown · non-contrast
Comparison: 07/09/2021

CLINICAL DATA: Shortness of breath, productive cough, fever

EXAM:
CHEST - 2 VIEW

[x chest ap]
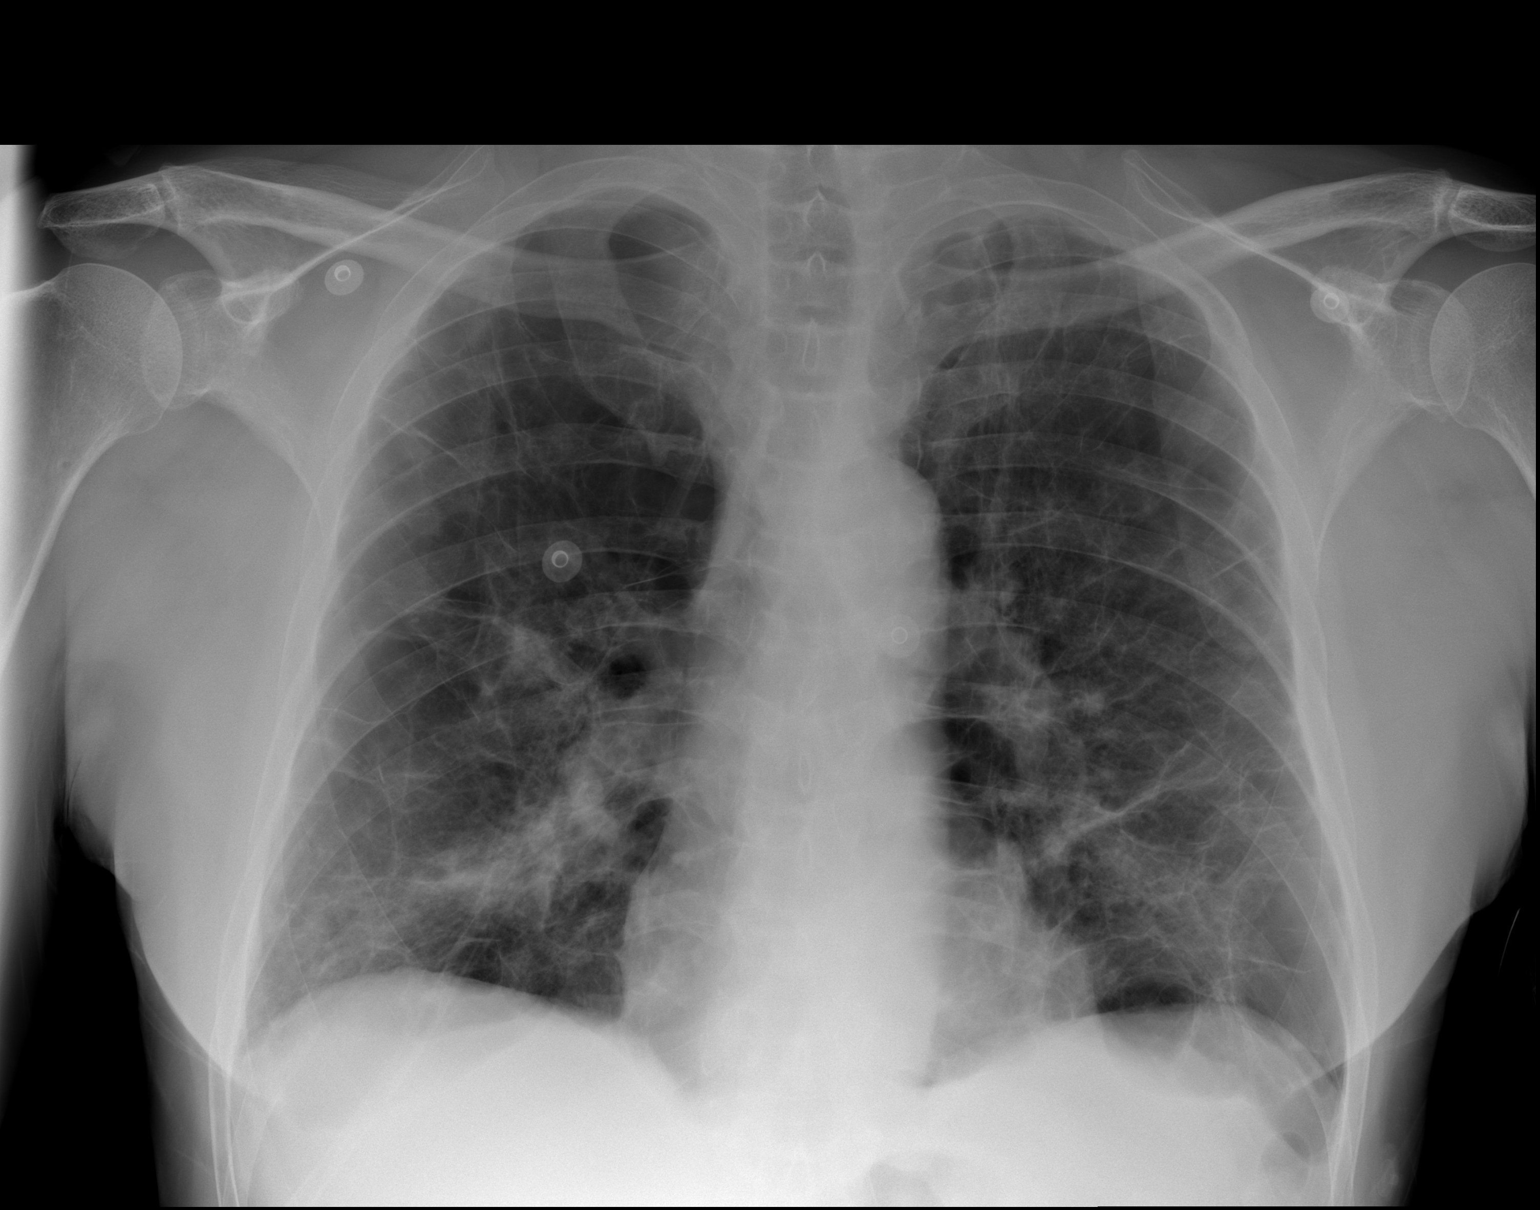

[w chest lat]
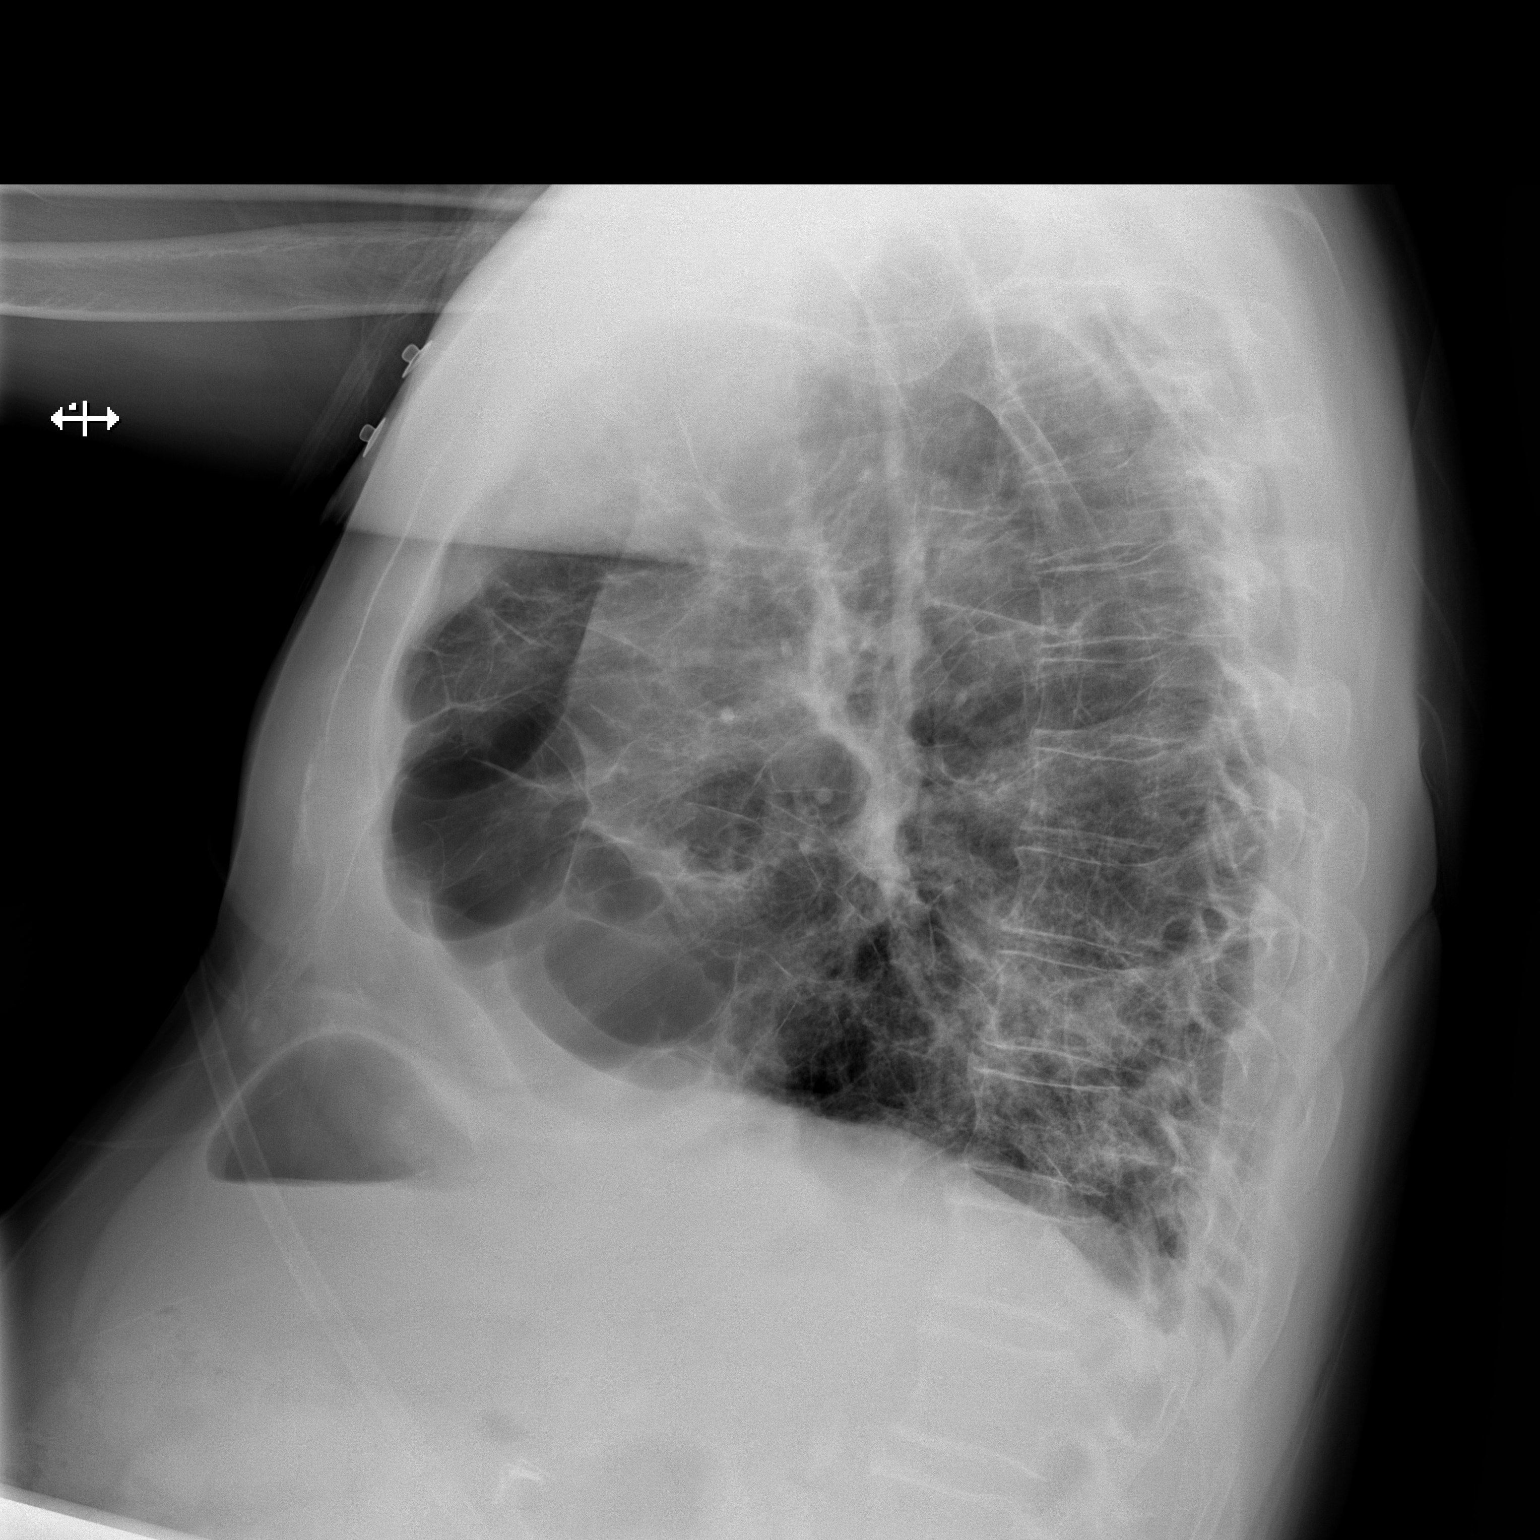

[2 of 2 positions shown; findings below may reference images not displayed]

FINDINGS: Cardiac size is within normal limits. COPD with bullous emphysema is
seen. There is linear patchy infiltrate in the medial right lower
lung fields suggesting scarring and possibly superimposed
atelectasis/pneumonia. There are other thin linear densities in both
lungs suggesting scarring. There is no pleural effusion or
pneumothorax.
IMPRESSION: Severe COPD with bullous emphysema. There is linear patchy
infiltrate in the medial right lower lung fields suggesting scarring
and possibly superimposed atelectasis/pneumonia.

## 2023-04-26 IMAGING — DX DG ABDOMEN 1V
1 series · 2 of 2 positions shown · non-contrast
Comparison: CT abdomen pelvis and reconstructions 05/22/2021.

CLINICAL DATA: [id number].  Abdominal pain.

EXAM:
ABDOMEN - 1 VIEW

[Series 1: abdomen · 0.14mm/px · 2 of 2 slices shown]
[im 1/2]
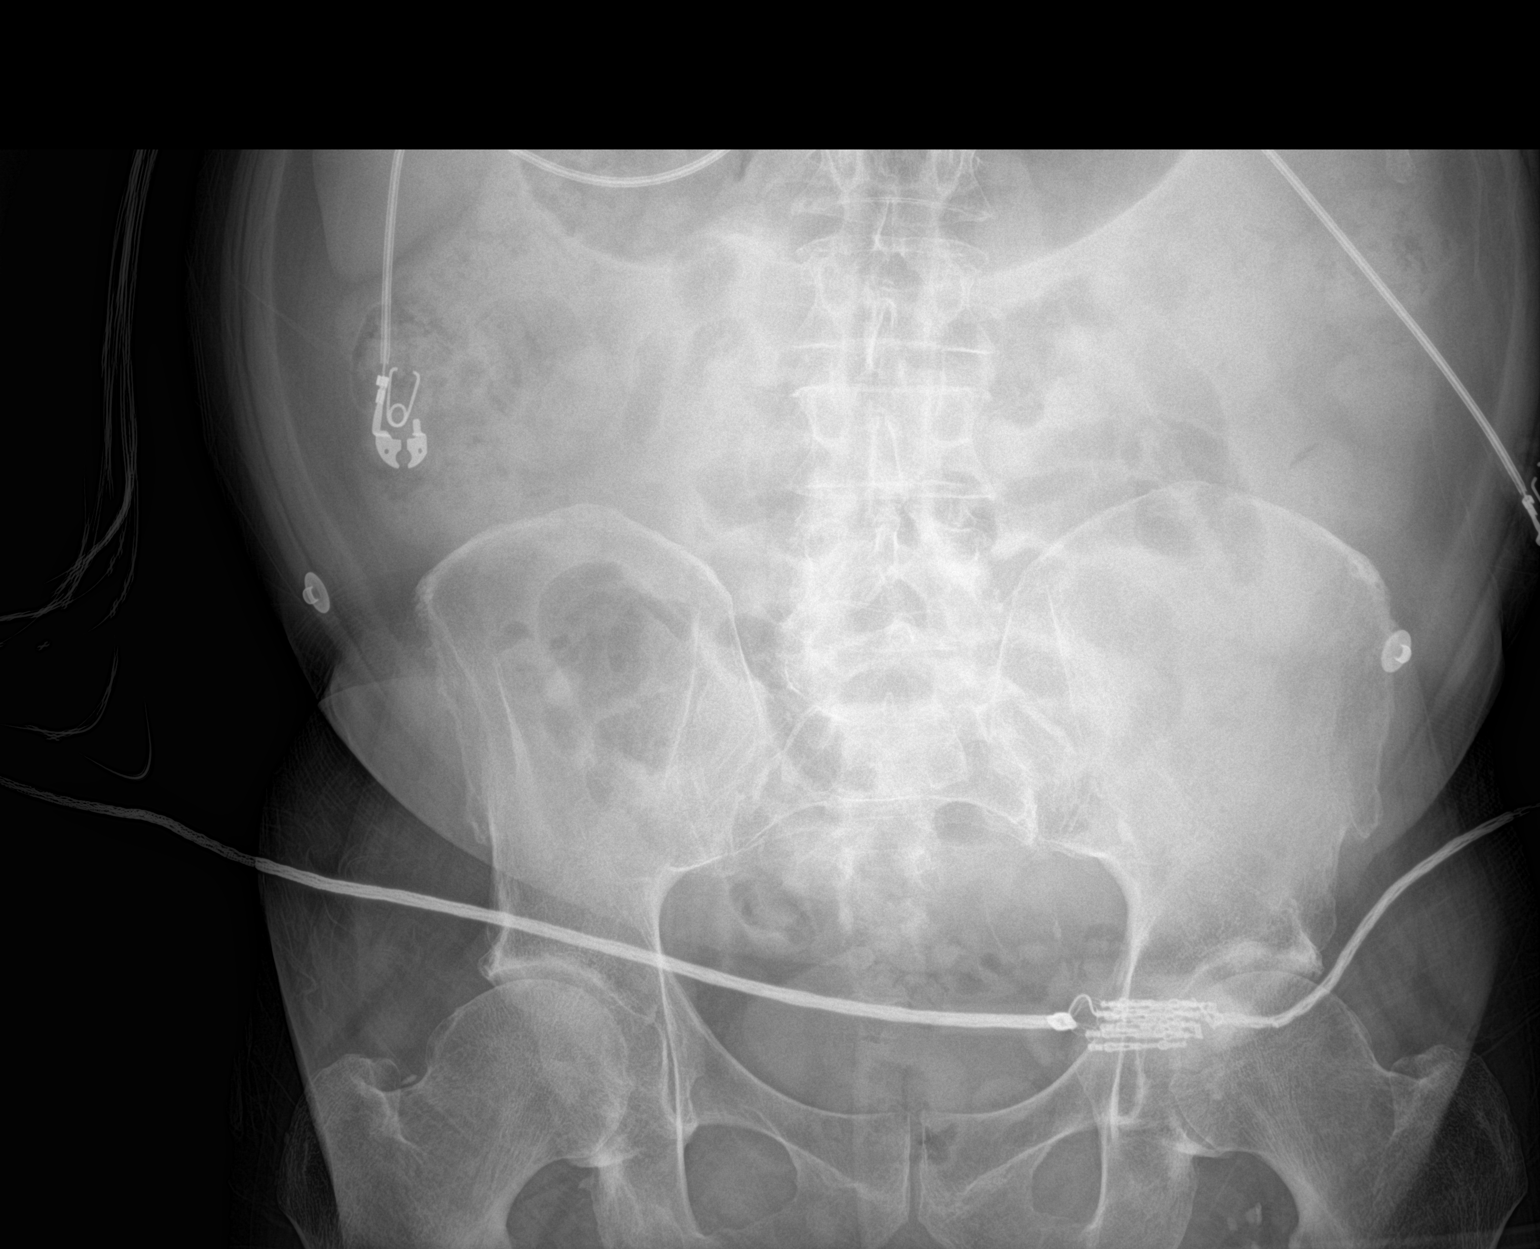
[im 2/2]
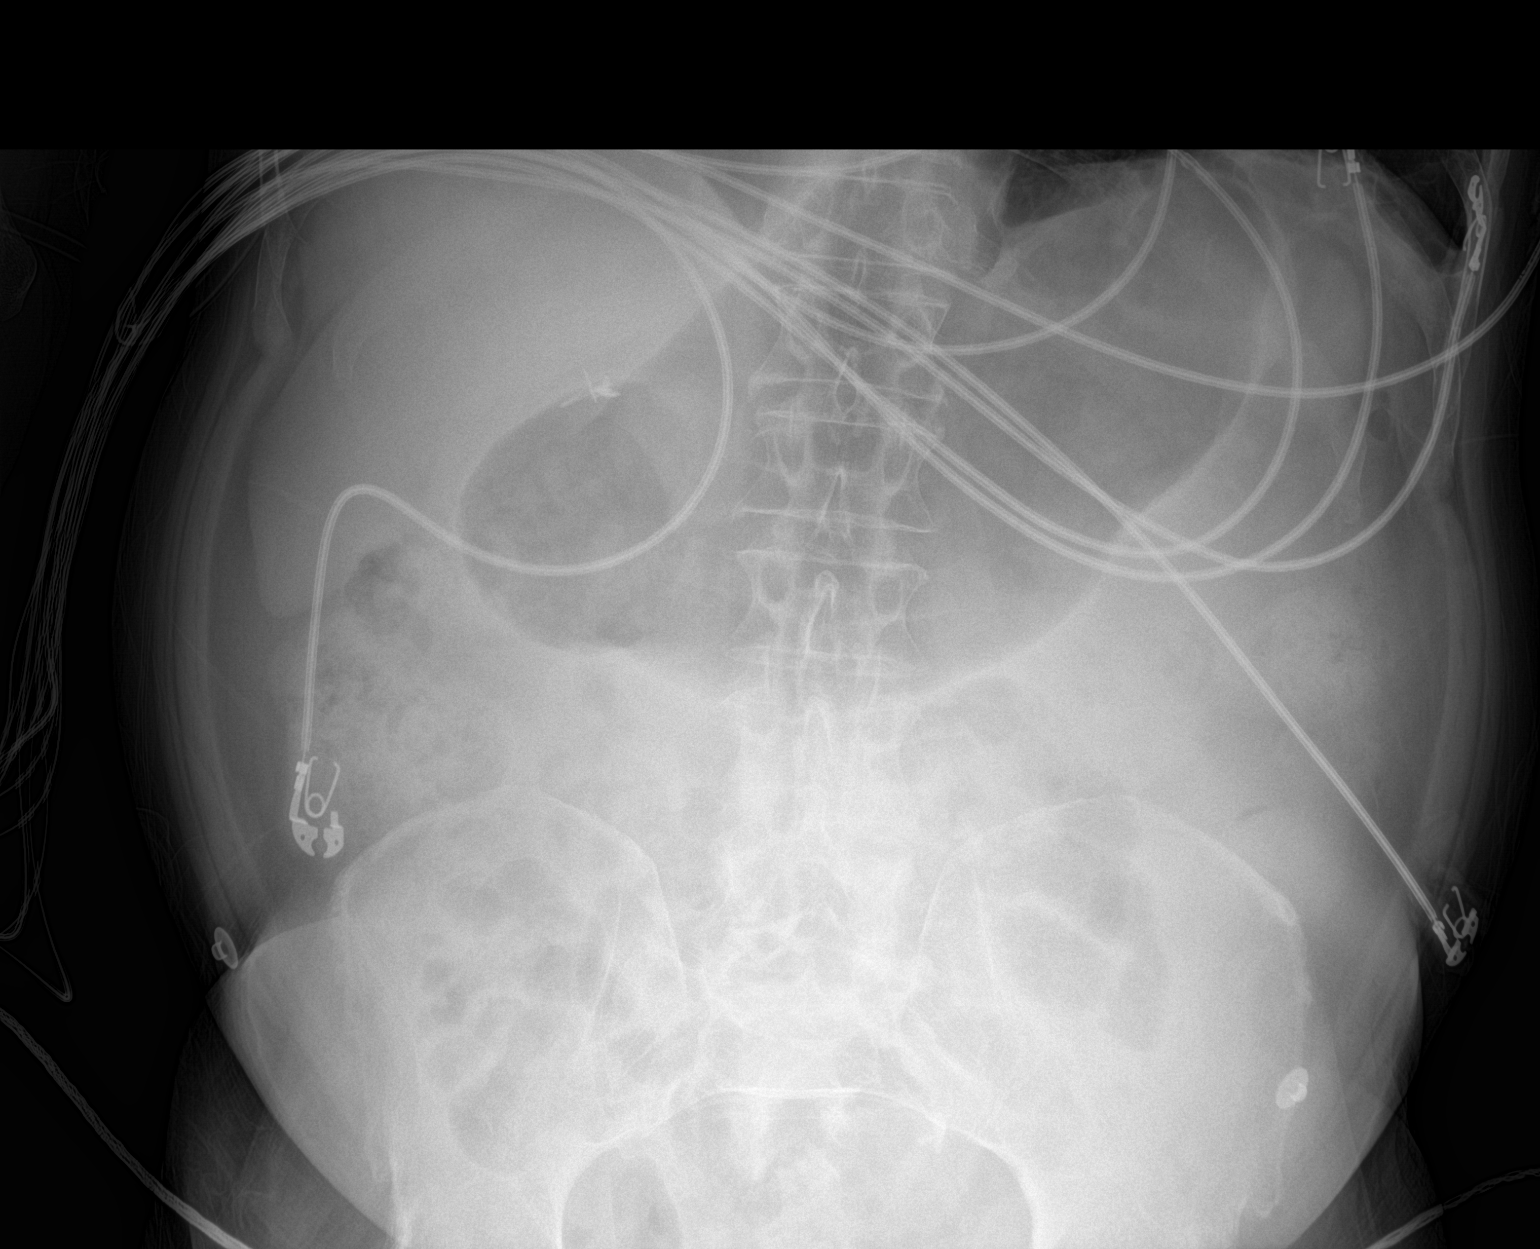

[2 of 2 positions shown; findings below may reference images not displayed]

FINDINGS: The bowel gas pattern is normal but there is moderate gas distention
of the stomach much greater than previously. No radio-opaque calculi
or acute radiographic abnormality are seen. There is heavy
aortoiliac calcific plaque. There are cholecystectomy clips seen
previously. Osteopenia with degenerative changes of the spine both
hips.
IMPRESSION: Moderately gas distended stomach. No other significant findings. No
bowel dilatation or thumbprinting is seen. Osteopenia and
degenerative change. Aortic atherosclerosis.

## 2023-05-04 DIAGNOSIS — E7800 Pure hypercholesterolemia, unspecified: Secondary | ICD-10-CM | POA: Diagnosis not present

## 2023-05-04 DIAGNOSIS — E559 Vitamin D deficiency, unspecified: Secondary | ICD-10-CM | POA: Diagnosis not present

## 2023-05-04 DIAGNOSIS — F319 Bipolar disorder, unspecified: Secondary | ICD-10-CM | POA: Diagnosis not present

## 2023-05-04 DIAGNOSIS — K2100 Gastro-esophageal reflux disease with esophagitis, without bleeding: Secondary | ICD-10-CM | POA: Diagnosis not present

## 2023-05-04 DIAGNOSIS — Z0001 Encounter for general adult medical examination with abnormal findings: Secondary | ICD-10-CM | POA: Diagnosis not present

## 2023-05-04 DIAGNOSIS — Z79899 Other long term (current) drug therapy: Secondary | ICD-10-CM | POA: Diagnosis not present

## 2023-05-04 DIAGNOSIS — M1990 Unspecified osteoarthritis, unspecified site: Secondary | ICD-10-CM | POA: Diagnosis not present

## 2023-05-04 DIAGNOSIS — I119 Hypertensive heart disease without heart failure: Secondary | ICD-10-CM | POA: Diagnosis not present

## 2023-05-04 DIAGNOSIS — Z1331 Encounter for screening for depression: Secondary | ICD-10-CM | POA: Diagnosis not present

## 2023-05-04 DIAGNOSIS — J441 Chronic obstructive pulmonary disease with (acute) exacerbation: Secondary | ICD-10-CM | POA: Diagnosis not present

## 2023-05-04 DIAGNOSIS — E1165 Type 2 diabetes mellitus with hyperglycemia: Secondary | ICD-10-CM | POA: Diagnosis not present

## 2023-05-04 DIAGNOSIS — N401 Enlarged prostate with lower urinary tract symptoms: Secondary | ICD-10-CM | POA: Diagnosis not present

## 2023-05-19 ENCOUNTER — Ambulatory Visit (HOSPITAL_COMMUNITY)
Admission: RE | Admit: 2023-05-19 | Discharge: 2023-05-19 | Disposition: A | Discharge status: Home / Self Care | Payer: Medicare Other | Source: Ambulatory Visit | Attending: Adult Health | Admitting: Adult Health

## 2023-05-19 DIAGNOSIS — R918 Other nonspecific abnormal finding of lung field: Secondary | ICD-10-CM | POA: Insufficient documentation

## 2023-05-19 DIAGNOSIS — J90 Pleural effusion, not elsewhere classified: Secondary | ICD-10-CM | POA: Diagnosis not present

## 2023-05-19 DIAGNOSIS — J439 Emphysema, unspecified: Secondary | ICD-10-CM | POA: Diagnosis not present

## 2023-05-19 DIAGNOSIS — R911 Solitary pulmonary nodule: Secondary | ICD-10-CM | POA: Diagnosis not present

## 2023-06-19 ENCOUNTER — Other Ambulatory Visit: Payer: Self-pay

## 2023-06-19 DIAGNOSIS — R918 Other nonspecific abnormal finding of lung field: Secondary | ICD-10-CM

## 2023-06-22 ENCOUNTER — Inpatient Hospital Stay: Payer: Medicare Other | Attending: Internal Medicine

## 2023-06-22 ENCOUNTER — Inpatient Hospital Stay: Payer: Medicare Other | Admitting: Internal Medicine

## 2023-07-09 ENCOUNTER — Other Ambulatory Visit (HOSPITAL_COMMUNITY): Payer: Self-pay

## 2023-07-09 ENCOUNTER — Ambulatory Visit: Admitting: Emergency Medicine

## 2023-07-09 ENCOUNTER — Telehealth: Payer: Self-pay

## 2023-07-09 ENCOUNTER — Encounter: Payer: Self-pay | Admitting: Emergency Medicine

## 2023-07-09 VITALS — BP 127/76 | HR 107 | Ht 66.0 in | Wt 150.0 lb

## 2023-07-09 DIAGNOSIS — J449 Chronic obstructive pulmonary disease, unspecified: Secondary | ICD-10-CM | POA: Diagnosis not present

## 2023-07-09 DIAGNOSIS — R9389 Abnormal findings on diagnostic imaging of other specified body structures: Secondary | ICD-10-CM

## 2023-07-09 DIAGNOSIS — J441 Chronic obstructive pulmonary disease with (acute) exacerbation: Secondary | ICD-10-CM

## 2023-07-09 DIAGNOSIS — C3431 Malignant neoplasm of lower lobe, right bronchus or lung: Secondary | ICD-10-CM

## 2023-07-09 NOTE — Telephone Encounter (Signed)
 Preferred Triple therapy is Trelegy for $4.80 at this time.   Albuterol  is showing as $4.80 for the 18gm and $1.60 for the 8.5gm.  Not sure how patient is getting these meds for higher prices unless they had a deductible to meet previously.

## 2023-07-09 NOTE — Telephone Encounter (Signed)
 Can you please advise of some formulary alternatives for Breztri. Also pt advised us  he is having to pay high prices for albuterol  inhaler. Please advise regarding Breztri and albuterol 

## 2023-07-09 NOTE — Assessment & Plan Note (Signed)
 Presumed right lower lobe non-small cell lung cancer that was treated by SBRT.  His most recent CT chest shows possible progression of the right lower lobe opacity, subcarinal lymphadenopathy that appears to have progressed.  Unfortunately he is not a candidate for EBUS.  Not sure he will be a candidate for any further therapy for malignancy if present.  I will repeat his CT scan of the chest at the 58-month mark.  If evidence clinically or radiographically for progressive cancer then we may have to consider a transition to palliative care.

## 2023-07-09 NOTE — Progress Notes (Signed)
 Subjective:    Patient ID: Blake Mcknight, male    DOB: 12-13-1955, 68 y.o.   MRN: [id number]  HPI  ROV 11/05/2022.  Blake Mcknight Mcknight 68 with emphysematous COPD and associated chronic hypoxemic respiratory failure, paranoid schizophrenia, hypertension.  I have seen him for his COPD and also an abnormal CT scan of the chest with subcarinal lymphadenopathy, waxing and waning pulmonary nodular disease.  He was in the emergency department 10/09/2022 and had evidence for increasing right lower lobe mass, now 5.7 cm, previously measured at 2.3 cm with some additional scattered nodular opacities (stable).  We had been managing him on Breztri, DuoNeb as needed - about every 4 hours.  Also on prednisone  20mg  daily.  His oxygen  Mcknight at 2L/min.  He Mcknight having cough. Light gray mucous, no hemoptysis. He Mcknight able to get up and move through the house, but Mcknight otherwise quite limited.   CT chest 10/09/2022 reviewed by me, shows patent central airways, mild bilateral bronchiectasis, increasing right lower lobe mass, now 5.7 x 3.7 cm (from 2.3 x 1.6 cm).  There are scattered stable right middle lobe and other nodules  ROV 07/09/2023 --follow-up visit for 68 year old man with emphysematous COPD and associated chronic hypoxemic respiratory failure, and abnormal CT scan of the chest with bronchiectasis and a right lower lobe mass that was felt to be presumed lung cancer and was treated empirically with SBRT.  There was appropriate decrease in size on repeat CT chest 03/31/2023 but an increase in size of a 1.1 cm right hilar lymph node.  Repeat CT chest was done 05/19/2023 He has been on Breztri, but has received notice that it will no longer be paid for by his insurance. He uses albuterol  nebs   CT scan of the chest 05/19/23 reviewed by me, shows slight increase in his subcarinal lymph node now 4.1 x 2.6 cm, stable prominent right mid hilar node 1.2 cm, severe emphysema, stable partially calcified 2.0 x 1.3 cm right lower lobe  superior segment nodule, right lower lobe posterior basal mass that may have increased slightly in size compared with 03/31/2023, question associated airspace disease   Review of Systems As per HPI     Objective:   Physical Exam Vitals:   07/09/23 1416  BP: 127/76  Pulse: (!) 107  SpO2: 97%  Weight: 150 lb (68 kg)  Height: 5\' 6"  (1.676 m)     Gen: Debilitated man in a wheelchair, well-nourished, in no distress, depressed affect  ENT: No lesions,  mouth clear,  oropharynx clear, no postnasal drip  Neck: No JVD, no stridor  Lungs: No use of accessory muscles, very distant, no crackles or wheezing on normal respiration, no wheeze on forced expiration  Cardiovascular: RRR, heart sounds normal, no murmur or gallops, no peripheral edema  Musculoskeletal: No deformities, no cyanosis or clubbing  Neuro: Awake, alert, will answer some simple questions.  Mild dysarthria  Skin: Warm, no lesions or rash      Assessment & Plan:  COPD (chronic obstructive pulmonary disease) (HCC) Continue Breztri 2 puffs twice a day for now.  Rinse and gargle after using.  We will have our clinical pharmacist check to see if this Mcknight still covered by insurance.  If not then we will look for covered alternatives. Continue your albuterol  nebulizers up to every 4 hours if needed for shortness of breath, chest tightness, wheezing.  We will make sure that this Mcknight ordered through Medicare part D to guarantee good insurance coverage.  Continue your oxygen  at 2 L/min  Malignant neoplasm of lower lobe of right lung (HCC) Presumed right lower lobe non-small cell lung cancer that was treated by SBRT.  His most recent CT chest shows possible progression of the right lower lobe opacity, subcarinal lymphadenopathy that appears to have progressed.  Unfortunately he Mcknight not a candidate for EBUS.  Not sure he will be a candidate for any further therapy for malignancy if present.  I will repeat his CT scan of the chest at  the 75-month mark.  If evidence clinically or radiographically for progressive cancer then we may have to consider a transition to palliative care.     Racheal Buddle, MD, PhD 07/09/2023, 2:43 PM [facility] [phone] or if no answer before 7:00PM call [phone] For any issues after 7:00PM please call eLink [phone]

## 2023-07-09 NOTE — Telephone Encounter (Signed)
 Pharmacy Patient Advocate Encounter   Received notification from Pt Calls Messages that prior authorization for Breztri is required/requested.   Insurance verification completed.   The patient is insured through [employer] .   Per test claim:  Trelegy is preferred by the insurance.  If suggested medication is appropriate, Please send in a new RX and discontinue this one. If not, please advise as to why it's not appropriate so that we may request a Prior Authorization. Please note, some preferred medications may still require a PA.  If the suggested medications have not been trialed and there are no contraindications to their use, the PA will not be submitted, as it will not be approved.   *sent to office in original message

## 2023-07-09 NOTE — Telephone Encounter (Signed)
*  correction- the Brand Ventolin  inhaler is 4.80, the generic Albuterol  inhalers for 8.5gm and 18gm is 1.60

## 2023-07-09 NOTE — Assessment & Plan Note (Signed)
 Continue Breztri 2 puffs twice a day for now.  Rinse and gargle after using.  We will have our clinical pharmacist check to see if this is still covered by insurance.  If not then we will look for covered alternatives. Continue your albuterol  nebulizers up to every 4 hours if needed for shortness of breath, chest tightness, wheezing.  We will make sure that this is ordered through Medicare part D to guarantee good insurance coverage. Continue your oxygen  at 2 L/min

## 2023-07-09 NOTE — Patient Instructions (Addendum)
 Continue Breztri 2 puffs twice a day for now.  Rinse and gargle after using.  We will have our clinical pharmacist check to see if this is still covered by insurance.  If not then we will look for covered alternatives. Continue your albuterol  nebulizers up to every 4 hours if needed for shortness of breath, chest tightness, wheezing.  We will make sure that this is ordered through Medicare part D to guarantee good insurance coverage. Continue your oxygen  at 2 L/min We will plan to repeat your CT scan of the chest in June 2025 Follow-up with radiation oncology as planned Follow Dr. Baldwin Levee in June after your CT so we can review those results together.

## 2023-07-09 NOTE — Telephone Encounter (Signed)
 Brand Ventolin - $4.80 Albuterol  HFA 8.5gm/18gm- $1.60

## 2023-07-10 ENCOUNTER — Other Ambulatory Visit (HOSPITAL_COMMUNITY): Payer: Self-pay

## 2023-07-10 NOTE — Telephone Encounter (Signed)
 Nebulizer solutions for this patient are to be billed to their Medicare Part B coverage. We cannot advise a benefits investigation for these drugs.

## 2023-07-10 NOTE — Telephone Encounter (Signed)
 Can you advise of some formulary alternatives for ipratropium albuterol  duoneb nebulizer. Routing to [employer]

## 2023-07-13 MED ORDER — IPRATROPIUM-ALBUTEROL 0.5-2.5 (3) MG/3ML IN SOLN: 3.0000 mL | RESPIRATORY_TRACT | 3 refills | Status: DC

## 2023-07-13 NOTE — Telephone Encounter (Signed)
 Neb med has been sent under Medicare part B.  Dr. Baldwin Levee please if you would like pt to change to Trelegy instead of Breztri

## 2023-07-13 NOTE — Telephone Encounter (Signed)
 Ok to change to Trelegy if he agrees.   Also, aren't neb meds supposed to be billed under part D? Do we ned to make sure his pharmacy is doing this correctly?

## 2023-07-20 MED ORDER — TRELEGY ELLIPTA 100-62.5-25 MCG/ACT IN AEPB: 1.0000 | INHALATION_SPRAY | Freq: Once | RESPIRATORY_TRACT | 6 refills | Status: AC

## 2023-07-20 NOTE — Telephone Encounter (Signed)
 Spoke with [name] per DPR. Pt is going to switch to Trelegy inhaler. Rx has been sent to pharm.  Pharmacy is aware it needs to be billed under Medicare part B. NFN

## 2023-07-30 ENCOUNTER — Ambulatory Visit (HOSPITAL_BASED_OUTPATIENT_CLINIC_OR_DEPARTMENT_OTHER): Admitting: Psychiatry

## 2023-07-30 ENCOUNTER — Encounter (HOSPITAL_COMMUNITY): Payer: Self-pay | Admitting: Psychiatry

## 2023-07-30 ENCOUNTER — Other Ambulatory Visit: Payer: Self-pay

## 2023-07-30 ENCOUNTER — Telehealth (HOSPITAL_COMMUNITY): Admitting: Psychiatry

## 2023-07-30 VITALS — BP 116/73 | HR 108 | Ht 66.0 in | Wt 150.0 lb

## 2023-07-30 DIAGNOSIS — F200 Paranoid schizophrenia: Secondary | ICD-10-CM

## 2023-07-30 DIAGNOSIS — F411 Generalized anxiety disorder: Secondary | ICD-10-CM

## 2023-07-30 DIAGNOSIS — F99 Mental disorder, not otherwise specified: Secondary | ICD-10-CM | POA: Diagnosis not present

## 2023-07-30 DIAGNOSIS — F5105 Insomnia due to other mental disorder: Secondary | ICD-10-CM

## 2023-07-30 MED ORDER — MIRTAZAPINE 30 MG PO TABS
30.0000 mg | ORAL_TABLET | Freq: Every evening | ORAL | 1 refills | Status: DC | PRN
Start: 2023-07-30 — End: 2024-02-03

## 2023-07-30 MED ORDER — BENZTROPINE MESYLATE 2 MG PO TABS: 2.0000 mg | ORAL_TABLET | Freq: Two times a day (BID) | ORAL | 1 refills | Status: DC

## 2023-07-30 MED ORDER — RISPERIDONE 3 MG PO TABS: 3.0000 mg | ORAL_TABLET | Freq: Two times a day (BID) | ORAL | 1 refills | Status: DC

## 2023-07-30 NOTE — Progress Notes (Signed)
 [facility] MD/PA/NP OP Progress Note   Patient Location: Office Provider Location" Office  07/30/2023 2:33 PM Blake Mcknight  MRN:  [id number]  Chief Complaint:  Chief Complaint  Patient presents with   Follow-up   Medication Refill   HPI: Patient came today for his follow-up appointment with his sister.  Sister reported things are stable.  He recently had seen by PCP.  Patient was seen in the emergency room due to a exacerbation of breathing problem.  Most of the information was obtained from his sister.  Patient noticed talking to himself mumbling with labile mood and giggling but denies any suicidal thoughts or homicidal thoughts.  Patient is on nasal oxygen .  Sister helps him a lot and takes him to the doctor's appointment.  Sister is pleased that patient is not aggressive, agitated, violent and take his medication as prescribed.  Patient has limited ADLs and require help and sister helps the medication pillbox.  He has mild tremors but Cogentin  helps.  Patient reported chronic pain in his elbow but no other concerns.  His memory and concentration is poor to fair.  He is on mirtazapine  which helps his sleep.  His appetite is fair.  He lost few pounds since the last visit.   Visit Diagnosis:    ICD-10-CM   1. Insomnia due to other mental disorder  F51.05 mirtazapine  (REMERON ) 30 MG tablet   F99     2. Paranoid schizophrenia (HCC)  F20.0 benztropine  (COGENTIN ) 2 MG tablet    risperiDONE  (RISPERDAL ) 3 MG tablet    mirtazapine  (REMERON ) 30 MG tablet    3. Generalized anxiety disorder  F41.1 mirtazapine  (REMERON ) 30 MG tablet      Past Psychiatric History: Reviewed H/O schizophrenia and admitted at [facility] years [facility]. H/O paranoid, delusional, disorganized behavior and having mood swings.    Past Medical History:  Past Medical History:  Diagnosis Date   COPD (chronic obstructive pulmonary disease) (HCC)    with bullous emphysema.    Heavy cigarette smoker before 2003   pt  claims only 10 cigs per day, never heavier amounts.    HLD (hyperlipidemia)    Hypertension    Schizophrenia (HCC) 06/28/2013   This is a chronic condition and he lives with family    Past Surgical History:  Procedure Laterality Date   CHOLECYSTECTOMY N/A 06/06/2013   Procedure: LAPAROSCOPIC CHOLECYSTECTOMY WITH ATTEMPTED INTRAOPERATIVE CHOLANGIOGRAM;  Surgeon: Cloyce Darby, MD;  Location: [facility] OR;  Service: General;  Laterality: N/A;   ERCP N/A 06/03/2013   Procedure: ENDOSCOPIC RETROGRADE CHOLANGIOPANCREATOGRAPHY (ERCP);  Surgeon: Asencion Blacksmith, MD;  Location: [facility] ENDOSCOPY;  Service: Endoscopy;  Laterality: N/A;    Family Psychiatric History: Reviewed  Family History:  Family History  Problem Relation Age of Onset   Diabetes Mellitus II Mother    Diabetes Mother    Heart disease Father    Stroke Maternal Aunt    CAD Neg Hx     Social History:  Social History   Socioeconomic History   Marital status: Single    Spouse name: Not on file   Number of children: Not on file   Years of education: Not on file   Highest education level: Not on file  Occupational History   Occupation: disabled  Tobacco Use   Smoking status: Former    Current packs/day: 1.00    Average packs/day: 1 pack/day for 41.0 years (41.0 ttl pk-yrs)    Types: Cigarettes   Smokeless tobacco: Never  Tobacco comments:    Quit 7 - 8 months ago [facility] 07/17/21  Vaping Use   Vaping status: Never Used  Substance and Sexual Activity   Alcohol use: No   Drug use: No   Sexual activity: Yes  Other Topics Concern   Not on file  Social History Narrative   Not on file   Social Drivers of Health   Financial Resource Strain: Not on file  Food Insecurity: No Food Insecurity (04/16/2023)   Hunger Vital Sign    Worried About Running Out of Food in the Last Year: Never true    Ran Out of Food in the Last Year: Never true  Transportation Needs: No Transportation Needs (04/16/2023)   PRAPARE - Therapist, art (Medical): No    Lack of Transportation (Non-Medical): No  Physical Activity: Not on file  Stress: Not on file  Social Connections: Socially Isolated (04/16/2023)   Social Connection and Isolation Panel [NHANES]    Frequency of Communication with Friends and Family: Once a week    Frequency of Social Gatherings with Friends and Family: Never    Attends Religious Services: Never    Diplomatic Services operational officer: No    Attends Engineer, structural: Never    Marital Status: Never married    Allergies: No Known Allergies  Metabolic Disorder Labs: No results found for: "HGBA1C", "MPG" No results found for: "PROLACTIN" No results found for: "CHOL", "TRIG", "HDL", "CHOLHDL", "VLDL", "LDLCALC" No results found for: "TSH"  Therapeutic Level Labs: No results found for: "LITHIUM" No results found for: "VALPROATE" No results found for: "CBMZ"  Current Medications: Current Outpatient Medications  Medication Sig Dispense Refill   albuterol  (VENTOLIN  HFA) 108 (90 Base) MCG/ACT inhaler Inhale 2 puffs into the lungs every 4 (four) hours as needed for wheezing or shortness of breath. 1 each 1   amLODipine  (NORVASC ) 10 MG tablet Take 10 mg by mouth daily.     atorvastatin  (LIPITOR) 10 MG tablet Take 10 mg by mouth at bedtime.     benztropine  (COGENTIN ) 2 MG tablet Take 1 tablet (2 mg total) by mouth 2 (two) times daily. 180 tablet 1   carvedilol  (COREG ) 3.125 MG tablet Take 1 tablet (3.125 mg total) by mouth 2 (two) times daily with a meal.  0   castor oil liquid Take 10 mLs by mouth daily as needed for moderate constipation or mild constipation.     cetirizine (ZYRTEC) 10 MG tablet Take 10 mg by mouth daily as needed for allergies or rhinitis.     cholecalciferol (VITAMIN D) 25 MCG (1000 UNIT) tablet Take 4,000 Units by mouth every evening.      famotidine  (PEPCID ) 40 MG tablet Take 40 mg by mouth 2 (two) times daily.     ipratropium-albuterol  (DUONEB)  0.5-2.5 (3) MG/3ML SOLN Take 3 mLs by nebulization every 4 (four) hours. 360 mL 3   JANUVIA 100 MG tablet Take 100 mg by mouth daily.     MILK THISTLE PO Take 1 capsule by mouth daily.     mirtazapine  (REMERON ) 30 MG tablet Take 1 tablet (30 mg total) by mouth at bedtime as needed (insomnia). 90 tablet 1   multivitamin (ONE-A-DAY MEN'S) TABS tablet Take 1 tablet by mouth daily.     OXYGEN  Inhale 2 L/min into the lungs continuous.      polyethylene glycol (MIRALAX  / GLYCOLAX ) 17 g packet Take 17 g by mouth daily as needed for mild  constipation.     potassium chloride  SA (KLOR-CON  M) 20 MEQ tablet Take 2 tablets (40 mEq total) by mouth daily. 6 tablet 0   risperiDONE  (RISPERDAL ) 3 MG tablet Take 1 tablet (3 mg total) by mouth 2 (two) times daily. 180 tablet 1   sodium chloride  (OCEAN) 0.65 % SOLN nasal spray Place 1 spray into both nostrils as needed for congestion.     tamsulosin  (FLOMAX ) 0.4 MG CAPS capsule Take 0.4 mg by mouth in the morning and at bedtime.     No current facility-administered medications for this visit.     Musculoskeletal: Strength & Muscle Tone: decreased Gait & Station: unsteady, using wheel chair Patient leans: Right and Left  Psychiatric Specialty Exam: Review of Systems  Blood pressure 116/73, pulse (!) 108, height 5\' 6"  (1.676 m), weight 150 lb (68 kg).Body mass index is 24.21 kg/m.  General Appearance: Disheveled  Eye Contact:  Fair  Speech:  Slow  Volume:  Decreased  Mood:  Dysphoric  Affect:  Labile  Thought Process:  Descriptions of Associations: Circumstantial  Orientation:  Full (Time, Place, and Person)  Thought Content: Paranoid Ideation and Rumination   Suicidal Thoughts:  No  Homicidal Thoughts:  No  Memory:  Immediate;   Fair Recent;   Fair Remote;   Fair  Judgement:  Fair  Insight:  Shallow  Psychomotor Activity:  Decreased and Tremor  Concentration:  Concentration: Fair and Attention Span: Fair  Recall:  [employer] of Knowledge: Fair   Language: Fair  Akathisia:  No  Handed:  Right  AIMS (if indicated): not done  Assets:  Communication Skills Housing Social Support  ADL's:  Intact  Cognition: Impaired,  Mild  Sleep:  Fair   Screenings: Flowsheet Row ED to Hosp-Admission (Discharged) from 04/15/2023 in [facility] ED from 10/09/2022 in [facility] from 10/02/2022 in [facility] Emergency Department at [facility]  C-SSRS RISK CATEGORY No Risk No Risk No Risk        Assessment and Plan: Review collateral information and blood work results from chart.  As per sister patient is not agitated, aggressive or violent.  He has residual paranoia and hallucinations but stable.  He takes his medication.  Continue risperidone  3 mg 2 times a day and Cogentin  2 mg 2 times a day and mirtazapine  30 mg at bedtime.  In the past we had tried to cut down the medication but symptoms come back.  His sister insist to keep the same medication since it is working well.  Discussed medication side effects and benefits.  Follow-up in 6 months.  Collaboration of Care: Collaboration of Care: Other provider involved in patient's care AEB notes are available in epic to review  Patient/Guardian was advised Release of Information must be obtained prior to any record release in order to collaborate their care with an outside provider. Patient/Guardian was advised if they have not already done so to contact the registration department to sign all necessary forms in order for us  to release information regarding their care.   Consent: Patient/Guardian gives verbal consent for treatment and assignment of benefits for services provided during this visit. Patient/Guardian expressed understanding and agreed to proceed.   I provided 31 minutes face-to-face time during this encounter.   Arturo Late, MD 07/30/2023, 2:33 PM

## 2023-08-04 ENCOUNTER — Telehealth (HOSPITAL_COMMUNITY): Admitting: Psychiatry

## 2023-08-09 ENCOUNTER — Encounter (HOSPITAL_COMMUNITY): Payer: Self-pay | Admitting: Emergency Medicine

## 2023-08-09 ENCOUNTER — Other Ambulatory Visit: Payer: Self-pay

## 2023-08-09 ENCOUNTER — Emergency Department (HOSPITAL_COMMUNITY)

## 2023-08-09 ENCOUNTER — Inpatient Hospital Stay (HOSPITAL_COMMUNITY)
Admission: EM | Admit: 2023-08-09 | Discharge: 2023-08-14 | DRG: 191 | Disposition: A | Discharge status: Home Health | Attending: Internal Medicine | Admitting: Internal Medicine

## 2023-08-09 DIAGNOSIS — D72828 Other elevated white blood cell count: Secondary | ICD-10-CM | POA: Diagnosis present

## 2023-08-09 DIAGNOSIS — J9611 Chronic respiratory failure with hypoxia: Secondary | ICD-10-CM | POA: Diagnosis present

## 2023-08-09 DIAGNOSIS — R1084 Generalized abdominal pain: Secondary | ICD-10-CM | POA: Diagnosis not present

## 2023-08-09 DIAGNOSIS — Z1152 Encounter for screening for COVID-19: Secondary | ICD-10-CM

## 2023-08-09 DIAGNOSIS — C3431 Malignant neoplasm of lower lobe, right bronchus or lung: Secondary | ICD-10-CM | POA: Diagnosis present

## 2023-08-09 DIAGNOSIS — K219 Gastro-esophageal reflux disease without esophagitis: Secondary | ICD-10-CM | POA: Diagnosis present

## 2023-08-09 DIAGNOSIS — Z8249 Family history of ischemic heart disease and other diseases of the circulatory system: Secondary | ICD-10-CM

## 2023-08-09 DIAGNOSIS — K56609 Unspecified intestinal obstruction, unspecified as to partial versus complete obstruction: Secondary | ICD-10-CM | POA: Diagnosis not present

## 2023-08-09 DIAGNOSIS — R6510 Systemic inflammatory response syndrome (SIRS) of non-infectious origin without acute organ dysfunction: Secondary | ICD-10-CM | POA: Diagnosis not present

## 2023-08-09 DIAGNOSIS — X500XXA Overexertion from strenuous movement or load, initial encounter: Secondary | ICD-10-CM | POA: Diagnosis not present

## 2023-08-09 DIAGNOSIS — Z7951 Long term (current) use of inhaled steroids: Secondary | ICD-10-CM | POA: Diagnosis not present

## 2023-08-09 DIAGNOSIS — K769 Liver disease, unspecified: Secondary | ICD-10-CM | POA: Diagnosis not present

## 2023-08-09 DIAGNOSIS — R079 Chest pain, unspecified: Secondary | ICD-10-CM

## 2023-08-09 DIAGNOSIS — N400 Enlarged prostate without lower urinary tract symptoms: Secondary | ICD-10-CM | POA: Diagnosis present

## 2023-08-09 DIAGNOSIS — R0602 Shortness of breath: Secondary | ICD-10-CM | POA: Diagnosis not present

## 2023-08-09 DIAGNOSIS — N281 Cyst of kidney, acquired: Secondary | ICD-10-CM | POA: Diagnosis not present

## 2023-08-09 DIAGNOSIS — Z9981 Dependence on supplemental oxygen: Secondary | ICD-10-CM

## 2023-08-09 DIAGNOSIS — E876 Hypokalemia: Secondary | ICD-10-CM | POA: Diagnosis not present

## 2023-08-09 DIAGNOSIS — Z7984 Long term (current) use of oral hypoglycemic drugs: Secondary | ICD-10-CM

## 2023-08-09 DIAGNOSIS — J439 Emphysema, unspecified: Secondary | ICD-10-CM | POA: Diagnosis present

## 2023-08-09 DIAGNOSIS — J90 Pleural effusion, not elsewhere classified: Secondary | ICD-10-CM | POA: Diagnosis not present

## 2023-08-09 DIAGNOSIS — Z833 Family history of diabetes mellitus: Secondary | ICD-10-CM | POA: Diagnosis not present

## 2023-08-09 DIAGNOSIS — Z79899 Other long term (current) drug therapy: Secondary | ICD-10-CM | POA: Diagnosis not present

## 2023-08-09 DIAGNOSIS — R918 Other nonspecific abnormal finding of lung field: Secondary | ICD-10-CM | POA: Diagnosis not present

## 2023-08-09 DIAGNOSIS — K5900 Constipation, unspecified: Secondary | ICD-10-CM | POA: Diagnosis present

## 2023-08-09 DIAGNOSIS — F209 Schizophrenia, unspecified: Secondary | ICD-10-CM | POA: Diagnosis present

## 2023-08-09 DIAGNOSIS — Z87891 Personal history of nicotine dependence: Secondary | ICD-10-CM

## 2023-08-09 DIAGNOSIS — R590 Localized enlarged lymph nodes: Secondary | ICD-10-CM | POA: Diagnosis not present

## 2023-08-09 DIAGNOSIS — M4854XA Collapsed vertebra, not elsewhere classified, thoracic region, initial encounter for fracture: Secondary | ICD-10-CM | POA: Diagnosis present

## 2023-08-09 DIAGNOSIS — R140 Abdominal distension (gaseous): Secondary | ICD-10-CM | POA: Diagnosis not present

## 2023-08-09 DIAGNOSIS — R109 Unspecified abdominal pain: Secondary | ICD-10-CM | POA: Diagnosis present

## 2023-08-09 DIAGNOSIS — C3490 Malignant neoplasm of unspecified part of unspecified bronchus or lung: Secondary | ICD-10-CM | POA: Diagnosis not present

## 2023-08-09 DIAGNOSIS — Z515 Encounter for palliative care: Secondary | ICD-10-CM

## 2023-08-09 DIAGNOSIS — E785 Hyperlipidemia, unspecified: Secondary | ICD-10-CM | POA: Diagnosis present

## 2023-08-09 DIAGNOSIS — I10 Essential (primary) hypertension: Secondary | ICD-10-CM | POA: Diagnosis present

## 2023-08-09 DIAGNOSIS — R000 Tachycardia, unspecified: Secondary | ICD-10-CM | POA: Diagnosis not present

## 2023-08-09 DIAGNOSIS — R0902 Hypoxemia: Secondary | ICD-10-CM | POA: Diagnosis not present

## 2023-08-09 DIAGNOSIS — T380X5A Adverse effect of glucocorticoids and synthetic analogues, initial encounter: Secondary | ICD-10-CM | POA: Diagnosis present

## 2023-08-09 DIAGNOSIS — Z66 Do not resuscitate: Secondary | ICD-10-CM | POA: Diagnosis present

## 2023-08-09 DIAGNOSIS — E119 Type 2 diabetes mellitus without complications: Secondary | ICD-10-CM | POA: Diagnosis present

## 2023-08-09 DIAGNOSIS — J441 Chronic obstructive pulmonary disease with (acute) exacerbation: Principal | ICD-10-CM

## 2023-08-09 DIAGNOSIS — Z823 Family history of stroke: Secondary | ICD-10-CM

## 2023-08-09 DIAGNOSIS — M545 Low back pain, unspecified: Secondary | ICD-10-CM | POA: Diagnosis present

## 2023-08-09 HISTORY — DX: Chronic obstructive pulmonary disease with (acute) exacerbation: J44.1

## 2023-08-09 LAB — CBC WITH DIFFERENTIAL/PLATELET
Abs Immature Granulocytes: 0.08 10*3/uL — ABNORMAL HIGH (ref 0.00–0.07)
Basophils Absolute: 0.1 10*3/uL (ref 0.0–0.1)
Basophils Relative: 1 %
Eosinophils Absolute: 0.2 10*3/uL (ref 0.0–0.5)
Eosinophils Relative: 1 %
HCT: 39.8 % (ref 39.0–52.0)
Hemoglobin: 12.8 g/dL — ABNORMAL LOW (ref 13.0–17.0)
Immature Granulocytes: 1 %
Lymphocytes Relative: 7 %
Lymphs Abs: 1 10*3/uL (ref 0.7–4.0)
MCH: 26.9 pg (ref 26.0–34.0)
MCHC: 32.2 g/dL (ref 30.0–36.0)
MCV: 83.6 fL (ref 80.0–100.0)
Monocytes Absolute: 1.1 10*3/uL — ABNORMAL HIGH (ref 0.1–1.0)
Monocytes Relative: 7 %
Neutro Abs: 12.6 10*3/uL — ABNORMAL HIGH (ref 1.7–7.7)
Neutrophils Relative %: 83 %
Platelets: 306 10*3/uL (ref 150–400)
RBC: 4.76 MIL/uL (ref 4.22–5.81)
RDW: 17.5 % — ABNORMAL HIGH (ref 11.5–15.5)
WBC: 15 10*3/uL — ABNORMAL HIGH (ref 4.0–10.5)
nRBC: 0 % (ref 0.0–0.2)

## 2023-08-09 LAB — BASIC METABOLIC PANEL WITH GFR
Anion gap: 14 (ref 5–15)
BUN: 9 mg/dL (ref 8–23)
CO2: 25 mmol/L (ref 22–32)
Calcium: 10.2 mg/dL (ref 8.9–10.3)
Chloride: 93 mmol/L — ABNORMAL LOW (ref 98–111)
Creatinine, Ser: 0.61 mg/dL (ref 0.61–1.24)
GFR, Estimated: >60 mL/min (ref >60)
Glucose, Bld: 155 mg/dL — ABNORMAL HIGH (ref 70–99)
Potassium: 4 mmol/L (ref 3.5–5.1)
Sodium: 132 mmol/L — ABNORMAL LOW (ref 135–145)

## 2023-08-09 LAB — TROPONIN I (HIGH SENSITIVITY)
Troponin I (High Sensitivity): 10 ng/L (ref <18)
Troponin I (High Sensitivity): 8 ng/L (ref <18)

## 2023-08-09 LAB — RESP PANEL BY RT-PCR (RSV, FLU A&B, COVID)  RVPGX2
Influenza A by PCR: NEGATIVE
Influenza B by PCR: NEGATIVE
Resp Syncytial Virus by PCR: NEGATIVE
SARS Coronavirus 2 by RT PCR: NEGATIVE

## 2023-08-09 LAB — I-STAT CG4 LACTIC ACID, ED: Lactic Acid, Venous: 2.8 mmol/L (ref 0.5–1.9)

## 2023-08-09 MED ORDER — SODIUM CHLORIDE 0.9 % IV BOLUS
1000.0000 mL | Freq: Once | INTRAVENOUS | Status: AC
  Administered 2023-08-09: 1000 mL via INTRAVENOUS

## 2023-08-09 MED ORDER — IPRATROPIUM-ALBUTEROL 0.5-2.5 (3) MG/3ML IN SOLN
3.0000 mL | Freq: Once | RESPIRATORY_TRACT | Status: AC
  Administered 2023-08-09: 3 mL via RESPIRATORY_TRACT
  Filled 2023-08-09: qty 3

## 2023-08-09 MED ORDER — SODIUM CHLORIDE 0.9 % IV SOLN
500.0000 mg | Freq: Once | INTRAVENOUS | Status: AC
  Administered 2023-08-09: 500 mg via INTRAVENOUS
  Filled 2023-08-09: qty 5

## 2023-08-09 MED ORDER — METHYLPREDNISOLONE SODIUM SUCC 125 MG IJ SOLR
125.0000 mg | Freq: Once | INTRAMUSCULAR | Status: AC
  Administered 2023-08-09: 125 mg via INTRAVENOUS
  Filled 2023-08-09: qty 2

## 2023-08-09 MED ORDER — ALBUTEROL SULFATE (2.5 MG/3ML) 0.083% IN NEBU
7.5000 mg/h | INHALATION_SOLUTION | Freq: Once | RESPIRATORY_TRACT | Status: AC
  Administered 2023-08-09: 7.5 mg/h via RESPIRATORY_TRACT
  Filled 2023-08-09: qty 3

## 2023-08-09 MED ORDER — IOHEXOL 350 MG/ML SOLN
75.0000 mL | Freq: Once | INTRAVENOUS | Status: AC | PRN
  Administered 2023-08-09: 75 mL via INTRAVENOUS

## 2023-08-09 MED ORDER — SODIUM CHLORIDE 0.9 % IV SOLN
1.0000 g | Freq: Once | INTRAVENOUS | Status: AC
  Administered 2023-08-09: 1 g via INTRAVENOUS
  Filled 2023-08-09: qty 10

## 2023-08-09 NOTE — ED Provider Notes (Signed)
 [facility] Provider Note   CSN: [id number] Arrival date & time: 08/09/23  2001    History  Chief Complaint  Patient presents with   Shortness of Breath    Blake Mcknight is a 68 y.o. male here for shortness of breath.  Over last few days he has had some congestion, rhinorrhea, shortness of breath and chills.  Lives with sister who helps take care of him as he has history of schizophrenia.  She has been doing every 4 hour breathing treatments at home.  Today he had some increased work of breathing so she called EMS.  They gave him 2 nebulizers PTA, no steroids.  He apparently is not wearing his home oxygen  according to EMS.  Then 2 L via nasal cannula.  He has cough productive of yellow sputum.  Some chills without documented fever.  He has some diffuse chest pain.  Has worsening shortness of breath from baseline.  No abdominal pain, no pain or swelling to lower extremities.  Sounds like he has a history of lung cancer prior treatment follow-up last year according to sister, currently being watched for increase in size to mass.   Lives with sister Not always compliant with home O2  At baseline mentation  HPI     Home Medications Prior to Admission medications   Medication Sig Start Date End Date Taking? Authorizing Provider  albuterol  (VENTOLIN  HFA) 108 (90 Base) MCG/ACT inhaler Inhale 2 puffs into the lungs every 4 (four) hours as needed for wheezing or shortness of breath. 10/02/22  Yes Guadalupe Lee, MD  amLODipine  (NORVASC ) 5 MG tablet Take 5 mg by mouth daily. 06/26/21  Yes [provider]  atorvastatin  (LIPITOR) 10 MG tablet Take 10 mg by mouth at bedtime. 09/21/15  Yes [provider]  benztropine  (COGENTIN ) 2 MG tablet Take 1 tablet (2 mg total) by mouth 2 (two) times daily. 07/30/23  Yes Arfeen, Bronson Canny, MD  budesonide -glycopyrrolate -formoterol  (BREZTRI AEROSPHERE) 160-9-4.8 MCG/ACT AERO inhaler Inhale 1-2 puffs  into the lungs 2 (two) times daily.   Yes [provider]  carvedilol  (COREG ) 3.125 MG tablet Take 1 tablet (3.125 mg total) by mouth 2 (two) times daily with a meal. 02/20/19  Yes Audria Leather, MD  cetirizine (ZYRTEC) 10 MG tablet Take 10 mg by mouth daily as needed for allergies or rhinitis.   Yes [provider]  cholecalciferol (VITAMIN D) 25 MCG (1000 UNIT) tablet Take 5,000 Units by mouth every morning.   Yes [provider]  famotidine  (PEPCID ) 40 MG tablet Take 40 mg by mouth 2 (two) times daily. 12/28/18  Yes [provider]  ipratropium-albuterol  (DUONEB) 0.5-2.5 (3) MG/3ML SOLN Take 3 mLs by nebulization every 4 (four) hours. 07/13/23  Yes Byrum, Delora Ferry, MD  JANUVIA 100 MG tablet Take 100 mg by mouth daily. 01/30/23  Yes [provider]  mirtazapine  (REMERON ) 30 MG tablet Take 1 tablet (30 mg total) by mouth at bedtime as needed (insomnia). Patient taking differently: Take 30 mg by mouth at bedtime. 07/30/23 07/29/24 Yes Arfeen, Bronson Canny, MD  OXYGEN  Inhale 2 L/min into the lungs continuous.    Yes [provider]  polyethylene glycol (MIRALAX  / GLYCOLAX ) 17 g packet Take 17 g by mouth daily as needed for mild constipation.   Yes [provider]  potassium chloride  SA (KLOR-CON  M) 20 MEQ tablet Take 2 tablets (40 mEq total) by mouth daily. Patient taking differently: Take 20 mEq by  mouth 2 (two) times daily. 04/18/23  Yes Leona Rake, MD  risperiDONE  (RISPERDAL ) 3 MG tablet Take 1 tablet (3 mg total) by mouth 2 (two) times daily. 07/30/23  Yes Arfeen, Bronson Canny, MD  sodium chloride  (OCEAN) 0.65 % SOLN nasal spray Place 1 spray into both nostrils as needed for congestion.   Yes [provider]  tamsulosin  (FLOMAX ) 0.4 MG CAPS capsule Take 0.4 mg by mouth in the morning and at bedtime. 07/23/20  Yes [provider]  castor oil liquid Take 10 mLs by mouth daily as needed for moderate constipation or mild  constipation. Patient not taking: Reported on 08/09/2023    [provider]  MILK THISTLE PO Take 1 capsule by mouth daily. Patient not taking: Reported on 08/09/2023    [provider]  multivitamin (ONE-A-DAY MEN'S) TABS tablet Take 1 tablet by mouth daily. Patient not taking: Reported on 08/09/2023    [provider]      Allergies    Patient has no known allergies.    Review of Systems   Review of Systems  Constitutional:  Positive for activity change, chills and fatigue.  HENT:  Positive for congestion and rhinorrhea. Negative for sore throat, trouble swallowing and voice change.   Respiratory:  Positive for cough, shortness of breath and wheezing.   Cardiovascular:  Positive for chest pain.  Gastrointestinal: Negative.   Genitourinary: Negative.   Musculoskeletal: Negative.   Skin: Negative.   Neurological: Negative.   All other systems reviewed and are negative.   Physical Exam Updated Vital Signs BP 122/89   Pulse (!) 102   Temp 97.6 F (36.4 C) (Oral)   Resp (!) 22   Ht 5\' 6"  (1.676 m)   Wt 74.8 kg   SpO2 99%   BMI 26.63 kg/m  Physical Exam Vitals and nursing note reviewed.  Constitutional:      General: He is in acute distress.     Appearance: He is well-developed. He is ill-appearing. He is not toxic-appearing or diaphoretic.  HENT:     Head: Atraumatic.  Eyes:     Pupils: Pupils are equal, round, and reactive to light.  Cardiovascular:     Rate and Rhythm: Regular rhythm. Tachycardia present.     Pulses:          Radial pulses are 2+ on the right side and 2+ on the left side.     Heart sounds: Normal heart sounds.  Pulmonary:     Effort: Tachypnea and respiratory distress present.     Breath sounds: Decreased air movement present. Wheezing and rhonchi present.  Chest:     Chest wall: No tenderness or crepitus.  Abdominal:     General: Bowel sounds are normal. There is no distension.     Palpations: Abdomen is soft.   Musculoskeletal:        General: Normal range of motion.     Cervical back: Normal range of motion and neck supple.     Right lower leg: No tenderness. No edema.     Left lower leg: No tenderness. No edema.     Comments: No bony tenderness, compartments soft, full range of motion  Skin:    General: Skin is warm and dry.     Capillary Refill: Capillary refill takes less than 2 seconds.     Comments: No obvious rash or lesion on exposed skin  Neurological:     General: No focal deficit present.     Mental Status:  He is alert and oriented to person, place, and time.     ED Results / Procedures / Treatments   Labs (all labs ordered are listed, but only abnormal results are displayed) Labs Reviewed  CBC WITH DIFFERENTIAL/PLATELET - Abnormal; Notable for the following components:      Result Value   WBC 15.0 (*)    Hemoglobin 12.8 (*)    RDW 17.5 (*)    Neutro Abs 12.6 (*)    Monocytes Absolute 1.1 (*)    Abs Immature Granulocytes 0.08 (*)    All other components within normal limits  BASIC METABOLIC PANEL WITH GFR - Abnormal; Notable for the following components:   Sodium 132 (*)    Chloride 93 (*)    Glucose, Bld 155 (*)    All other components within normal limits  I-STAT CG4 LACTIC ACID, ED - Abnormal; Notable for the following components:   Lactic Acid, Venous 2.8 (*)    All other components within normal limits  RESP PANEL BY RT-PCR (RSV, FLU A&B, COVID)  [id number]  CULTURE, BLOOD (ROUTINE X 2)  CULTURE, BLOOD (ROUTINE X 2)  I-STAT CG4 LACTIC ACID, ED  TROPONIN I (HIGH SENSITIVITY)  TROPONIN I (HIGH SENSITIVITY)    EKG None  Radiology CT Angio Chest PE W and/or Wo Contrast Result Date: 08/09/2023 CLINICAL DATA:  High probability pulmonary embolism. Shortness of breath. History of lung cancer, hypoxia, tachycardia. EXAM: CT ANGIOGRAPHY CHEST WITH CONTRAST TECHNIQUE: Multidetector CT imaging of the chest was performed using the standard protocol during bolus  administration of intravenous contrast. Multiplanar CT image reconstructions and MIPs were obtained to evaluate the vascular anatomy. RADIATION DOSE REDUCTION: This exam was performed according to the departmental dose-optimization program which includes automated exposure control, adjustment of the mA and/or kV according to patient size and/or use of iterative reconstruction technique. CONTRAST:  75mL OMNIPAQUE  IOHEXOL  350 MG/ML SOLN COMPARISON:  Chest radiograph 08/09/2023 and CT chest 05/19/2023 FINDINGS: Cardiovascular: Normal heart size. No pericardial effusion. Negative for acute pulmonary embolism. Normal caliber thoracic aorta. No evidence of aortic dissection. Coronary artery and aortic atherosclerotic calcification. Mediastinum/Nodes: Debris in the posterior trachea. Unremarkable esophagus. Unchanged 1.1 cm right hilar node on series 7/image 193 and 2.5 cm subcarinal confluent node on 7/209. Lungs/Pleura: Advanced emphysema with bullous change. Upper Abdomen: No acute abnormality. Partially calcified superior segment right lower lobe nodule is unchanged measuring 2.0 by 1.3 cm (series 6/image 93). Similar right lower lobe posterior basal mass measuring 4.9 x 7.0 cm (series 6/image 102). Similar bronchial wall thickening and mucous plugging and the bilateral lower lobes and right middle lobe bronchi. Probable small right pleural effusion. No pneumothorax. Musculoskeletal: Since 05/19/2023 there is a new compression fracture of T7 with 50% vertebral body height loss centrally. No retropulsion. No evidence of underlying metastatic lesion. Review of the MIP images confirms the above findings. IMPRESSION: 1. Negative for acute pulmonary embolism. 2. Similar right lower lobe posterior basal mass, right hilar and mediastinal lymphadenopathy compared to 05/19/2023. 3. Similar bronchial wall thickening and mucous plugging in the bilateral lower lobes and right middle lobe bronchi. 4. Small right pleural effusion.  5. Since 05/19/2023 there is a new compression fracture of T7 with 50% vertebral body height loss centrally. No retropulsion. No evidence of underlying metastatic lesion. Aortic Atherosclerosis (ICD10-I70.0) and Emphysema (ICD10-J43.9). Electronically Signed   By: Rozell Cornet M.D.   On: 08/09/2023 22:28   DG Chest Portable 1 View Result Date: 08/09/2023 CLINICAL DATA:  Shortness of breath.  EXAM: PORTABLE CHEST 1 VIEW COMPARISON:  October 09, 2022 FINDINGS: The heart size and mediastinal contours are within normal limits. There is evidence of extensive emphysematous lung disease. A mild component of superimposed atelectasis and/or infiltrate is noted within the mid left lung. There is moderate severity right basilar scarring, atelectasis and/or infiltrate. A component of the patient's known right basilar lung mass is also noted. Mild left basilar linear scarring and/or atelectasis is seen. There is a small right pleural effusion. No pneumothorax is identified. Radiopaque surgical clips are seen within the right upper quadrant. The visualized skeletal structures are unremarkable. IMPRESSION: 1. Extensive emphysematous lung disease with a mild component of superimposed atelectasis and/or infiltrate within the mid left lung. 2. Moderate severity right basilar scarring, atelectasis and/or infiltrate with a component of the patient's known right basilar lung mass. 3. Small right pleural effusion. Electronically Signed   By: Virgle Grime M.D.   On: 08/09/2023 20:50    Procedures .Critical Care  Performed by: Dickson Founds, PA-C Authorized by: Dickson Founds, PA-C   Critical care provider statement:    Critical care time (minutes):  35   Critical care was necessary to treat or prevent imminent or life-threatening deterioration of the following conditions:  Sepsis and respiratory failure   Critical care was time spent personally by me on the following activities:  Development of treatment plan  with patient or surrogate, discussions with consultants, evaluation of patient's response to treatment, examination of patient, ordering and review of laboratory studies, ordering and review of radiographic studies, ordering and performing treatments and interventions, pulse oximetry, re-evaluation of patient's condition and review of old charts     Medications Ordered in ED Medications  azithromycin  (ZITHROMAX ) 500 mg in sodium chloride  0.9 % 250 mL IVPB (500 mg Intravenous New Bag/Given 08/09/23 2324)  albuterol  (PROVENTIL ) (2.5 MG/3ML) 0.083% nebulizer solution (has no administration in time range)  ipratropium-albuterol  (DUONEB) 0.5-2.5 (3) MG/3ML nebulizer solution 3 mL (3 mLs Nebulization Given 08/09/23 2054)  cefTRIAXone  (ROCEPHIN ) 1 g in sodium chloride  0.9 % 100 mL IVPB (0 g Intravenous Stopped 08/09/23 2247)  methylPREDNISolone  sodium succinate (SOLU-MEDROL ) 125 mg/2 mL injection 125 mg (125 mg Intravenous Given 08/09/23 2207)  ipratropium-albuterol  (DUONEB) 0.5-2.5 (3) MG/3ML nebulizer solution 3 mL (3 mLs Nebulization Given 08/09/23 2208)  sodium chloride  0.9 % bolus 1,000 mL (0 mLs Intravenous Stopped 08/09/23 2325)  iohexol  (OMNIPAQUE ) 350 MG/ML injection 75 mL (75 mLs Intravenous Contrast Given 08/09/23 2200)    ED Course/ Medical Decision Making/ A&P Clinical Course as of 08/09/23 2350  Sun Aug 09, 2023  2344 Dr. Michell Ahumada with medicine to see for admission [BH]    Clinical Course User Index [BH] Isolde Skaff A, PA-C   68 year old complex medical history of chronic hypoxic respiratory on 2 L at baseline here for evaluation of shortness of breath and URI symptoms.  Started few days ago.  Lives with his sister.  She has been trying to do nebulizers every 4 hours at home however due to increased work of breathing was brought here.  He had 2 nebulizers prior to EMS arrival.  He has known lung mass being worked up for cancer.  Said some chills at home with increased yellow sputum  production.  On arrival he has rhonchi, wheeze he is tachycardic, tachypneic.  On his home 2 L.  Will give breathing treatments, labs and imaging and reassess  Labs and imaging personally viewed and interpreted Chest x-ray shows pneumonia, COPD CBC leukocytosis to  15 Lactic acid 2 point Metabolic panel sodium 132 Viral panel negative Troponin 8  Patient reassessed.  Still with continued rhonchi and wheeze.  Will give additional breathing treatment, steroids.  He has leukocytosis, tachycardia, tachypnea and lactic acidosis.  Will start antibiotic for possible pneumonia.  Given 1 L IV fluids.  He is not hypotensive.  Afebrile.  CTA chest shows mucous plugging, enlarging mass.  No PE  Still some wheeze, increased work of breathing will start on continuous neb.  Discussed plan with patient and family in room.  Will admit for COPD laceration, SIRS.  Does not need BiPAP at this time.  Discussed with Dr. Michell Ahumada who is agreeable to eval patient for admission  The patient appears reasonably stabilized for admission considering the current resources, flow, and capabilities available in the ED at this time, and I doubt any other [facility] requiring further screening and/or treatment in the ED prior to admission.                                Medical Decision Making Amount and/or Complexity of Data Reviewed Independent Historian: EMS    Details: EMS Sister in room External Data Reviewed: labs, radiology, ECG and notes. Labs: ordered. Decision-making details documented in ED Course. Radiology: ordered and independent interpretation performed. Decision-making details documented in ED Course. ECG/medicine tests: ordered and independent interpretation performed. Decision-making details documented in ED Course.  Risk OTC drugs. Prescription drug management. Parenteral controlled substances. Decision regarding hospitalization. Diagnosis or treatment significantly limited by social determinants of  health.         Final Clinical Impression(s) / ED Diagnoses Final diagnoses:  COPD exacerbation (HCC)  SIRS (systemic inflammatory response syndrome) (HCC)    Rx / DC Orders ED Discharge Orders     None         Donald Jacque A, PA-C 08/09/23 2350    Kommor, Alyse July, MD 08/11/23 [phone]

## 2023-08-09 NOTE — ED Notes (Signed)
 Patient refusing treatment, to be monitored; [place], PA notified.

## 2023-08-09 NOTE — ED Triage Notes (Signed)
 Pt BIB [facility] with c/o SOB for a few days, hx of COPD. Worsening today with mild grunting. Breathing tx x 2 PTA of EMS. 90% RA, pt tolerating 2 lpm via [place]  BP 132/80 HR 100 Spo2 99% on 2 LPM BG 149

## 2023-08-09 NOTE — H&P (Signed)
 History and Physical    Blake Mcknight:[id number] DOB: May 11, 1955 DOA: 08/09/2023  PCP: Jacqueline Matsu, MD  Patient coming from: Home  Chief Complaint: Shortness of breath  HPI: Blake Mcknight is a 68 y.o. male with medical history significant of COPD, chronic hypoxemic respiratory failure on 2 L home oxygen , malignant neoplasm of lower lobe of right lung status post SBRT, hypertension, hyperlipidemia, schizophrenia, GERD, BPH, type 2 diabetes presenting with a chief complaint of shortness of breath.  Patient is a poor historian due to having schizophrenia.  Sister at bedside.  For the past 1 week he is having progressively worsening dyspnea, wheezing, and cough productive of yellow sputum.  He is using his nebulizer every 4 hours at home.  Patient is also complaining of constant substernal chest pain for the past few days.  In addition, he is reporting pain in his mid back for the past 1 week.  Denies any falls.  No lower extremity weakness or numbness.  Sister is concerned that the patient's back pain might be related to heavy lifting as he lifted a twin bed to remove his bandanna from underneath.  Patient is also complaining of generalized abdominal pain, unclear when this started.  No nausea or vomiting reported.  Sister reports patient having constipation.  Unclear when he last had a bowel movement.  ED Course: Patient was wheezing and EMS gave 2 nebulizer treatments prior to arrival.  Tachycardic and tachypneic on arrival to the ED.  Afebrile.  Satting well on 2 L [place].  Labs showing WBC count 15.0, hemoglobin 12.8 (stable), sodium 132 (chronically low and stable), troponin negative x 2, blood cultures in process, COVID/influenza/RSV PCR negative, lactic acid 2.8.  CTA chest negative for PE.  Showing stable right lower lobe posterior basal mass, right hilar and mediastinal lymphadenopathy since previous CT from 05/19/2023.  Also showing similar bronchial wall thickening and mucous  plugging in the bilateral lower lobes and right middle lobe bronchi and small right pleural effusion.  In addition, there is a new compression fracture of T7 with 50% vertebral body height loss centrally compared to previous CT from 05/19/2023.  No retropulsion or evidence of underlying metastatic lesion seen.  EKG showing sinus tachycardia and no acute ischemic changes. Patient was given DuoNeb x 2, Solu-Medrol  125 mg, ceftriaxone , azithromycin , 1 L normal saline, and albuterol  continuous neb treatment.  Review of Systems:  Review of Systems  All other systems reviewed and are negative.   Past Medical History:  Diagnosis Date   COPD (chronic obstructive pulmonary disease) (HCC)    with bullous emphysema.    Heavy cigarette smoker before 2003   pt claims only 10 cigs per day, never heavier amounts.    HLD (hyperlipidemia)    Hypertension    Schizophrenia (HCC) 06/28/2013   This is a chronic condition and he lives with family    Past Surgical History:  Procedure Laterality Date   CHOLECYSTECTOMY N/A 06/06/2013   Procedure: LAPAROSCOPIC CHOLECYSTECTOMY WITH ATTEMPTED INTRAOPERATIVE CHOLANGIOGRAM;  Surgeon: Cloyce Darby, MD;  Location: [facility] OR;  Service: General;  Laterality: N/A;   ERCP N/A 06/03/2013   Procedure: ENDOSCOPIC RETROGRADE CHOLANGIOPANCREATOGRAPHY (ERCP);  Surgeon: Asencion Blacksmith, MD;  Location: [facility] ENDOSCOPY;  Service: Endoscopy;  Laterality: N/A;     reports that he has quit smoking. His smoking use included cigarettes. He has a 41 pack-year smoking history. He has never used smokeless tobacco. He reports that he does not drink alcohol and does not use  drugs.  No Known Allergies  Family History  Problem Relation Age of Onset   Diabetes Mellitus II Mother    Diabetes Mother    Heart disease Father    Stroke Maternal Aunt    CAD Neg Hx     Prior to Admission medications   Medication Sig Start Date End Date Taking? Authorizing Provider  albuterol  (VENTOLIN  HFA) 108  (90 Base) MCG/ACT inhaler Inhale 2 puffs into the lungs every 4 (four) hours as needed for wheezing or shortness of breath. 10/02/22  Yes Guadalupe Lee, MD  amLODipine  (NORVASC ) 5 MG tablet Take 5 mg by mouth daily. 06/26/21  Yes [provider]  atorvastatin  (LIPITOR) 10 MG tablet Take 10 mg by mouth at bedtime. 09/21/15  Yes [provider]  benztropine  (COGENTIN ) 2 MG tablet Take 1 tablet (2 mg total) by mouth 2 (two) times daily. 07/30/23  Yes Arfeen, Bronson Canny, MD  budesonide -glycopyrrolate -formoterol  (BREZTRI AEROSPHERE) 160-9-4.8 MCG/ACT AERO inhaler Inhale 1-2 puffs into the lungs 2 (two) times daily.   Yes [provider]  carvedilol  (COREG ) 3.125 MG tablet Take 1 tablet (3.125 mg total) by mouth 2 (two) times daily with a meal. 02/20/19  Yes Audria Leather, MD  cetirizine (ZYRTEC) 10 MG tablet Take 10 mg by mouth daily as needed for allergies or rhinitis.   Yes [provider]  cholecalciferol (VITAMIN D) 25 MCG (1000 UNIT) tablet Take 5,000 Units by mouth every morning.   Yes [provider]  famotidine  (PEPCID ) 40 MG tablet Take 40 mg by mouth 2 (two) times daily. 12/28/18  Yes [provider]  ipratropium-albuterol  (DUONEB) 0.5-2.5 (3) MG/3ML SOLN Take 3 mLs by nebulization every 4 (four) hours. 07/13/23  Yes Byrum, Delora Ferry, MD  JANUVIA 100 MG tablet Take 100 mg by mouth daily. 01/30/23  Yes [provider]  mirtazapine  (REMERON ) 30 MG tablet Take 1 tablet (30 mg total) by mouth at bedtime as needed (insomnia). Patient taking differently: Take 30 mg by mouth at bedtime. 07/30/23 07/29/24 Yes Arfeen, Bronson Canny, MD  OXYGEN  Inhale 2 L/min into the lungs continuous.    Yes [provider]  polyethylene glycol (MIRALAX  / GLYCOLAX ) 17 g packet Take 17 g by mouth daily as needed for mild constipation.   Yes [provider]  potassium chloride  SA (KLOR-CON  M) 20 MEQ tablet Take 2 tablets (40 mEq total) by mouth daily. Patient  taking differently: Take 20 mEq by mouth 2 (two) times daily. 04/18/23  Yes Leona Rake, MD  risperiDONE  (RISPERDAL ) 3 MG tablet Take 1 tablet (3 mg total) by mouth 2 (two) times daily. 07/30/23  Yes Arfeen, Bronson Canny, MD  sodium chloride  (OCEAN) 0.65 % SOLN nasal spray Place 1 spray into both nostrils as needed for congestion.   Yes [provider]  tamsulosin  (FLOMAX ) 0.4 MG CAPS capsule Take 0.4 mg by mouth in the morning and at bedtime. 07/23/20  Yes [provider]  castor oil liquid Take 10 mLs by mouth daily as needed for moderate constipation or mild constipation. Patient not taking: Reported on 08/09/2023    [provider]  MILK THISTLE PO Take 1 capsule by mouth daily. Patient not taking: Reported on 08/09/2023    [provider]  multivitamin (ONE-A-DAY MEN'S) TABS tablet Take 1 tablet by mouth daily. Patient not taking: Reported on 08/09/2023    [provider]    Physical Exam: Vitals:   08/09/23 2200 08/09/23 2215 08/09/23 2230 08/09/23 2245  BP: Aaron Aas)  134/90 128/85 125/86 122/89  Pulse: (!) 110 (!) 102 (!) 103 (!) 102  Resp: (!) 28 17 (!) 29 (!) 22  Temp:      TempSrc:      SpO2: 99% 98% 99% 99%  Weight:      Height:        Physical Exam Vitals reviewed.  Constitutional:      General: He is not in acute distress. HENT:     Head: Normocephalic and atraumatic.  Eyes:     Extraocular Movements: Extraocular movements intact.  Cardiovascular:     Rate and Rhythm: Normal rate and regular rhythm.     Pulses: Normal pulses.  Pulmonary:     Effort: Pulmonary effort is normal. No respiratory distress.     Breath sounds: No wheezing or rales.  Abdominal:     General: Bowel sounds are normal. There is distension.     Palpations: Abdomen is soft.     Tenderness: There is no abdominal tenderness. There is no guarding.  Musculoskeletal:     Cervical back: Normal range of motion.     Right lower leg: No edema.     Left lower leg: No  edema.  Skin:    General: Skin is warm and dry.  Neurological:     General: No focal deficit present.     Mental Status: He is alert and oriented to person, place, and time.     Sensory: No sensory deficit.     Motor: No weakness.     Labs on Admission: I have personally reviewed following labs and imaging studies  CBC: Recent Labs  Lab 08/09/23 2047  WBC 15.0*  NEUTROABS 12.6*  HGB 12.8*  HCT 39.8  MCV 83.6  PLT 306   Basic Metabolic Panel: Recent Labs  Lab 08/09/23 2047  NA 132*  K 4.0  CL 93*  CO2 25  GLUCOSE 155*  BUN 9  CREATININE 0.61  CALCIUM  10.2   GFR: Estimated Creatinine Clearance: 83.1 mL/min (by C-G formula based on SCr of 0.61 mg/dL). Liver Function Tests: No results for input(s): "AST", "ALT", "ALKPHOS", "BILITOT", "PROT", "ALBUMIN" in the last 168 hours. No results for input(s): "LIPASE", "AMYLASE" in the last 168 hours. No results for input(s): "AMMONIA" in the last 168 hours. Coagulation Profile: No results for input(s): "INR", "PROTIME" in the last 168 hours. Cardiac Enzymes: No results for input(s): "CKTOTAL", "CKMB", "CKMBINDEX", "TROPONINI" in the last 168 hours. BNP (last 3 results) No results for input(s): "PROBNP" in the last 8760 hours. HbA1C: No results for input(s): "HGBA1C" in the last 72 hours. CBG: No results for input(s): "GLUCAP" in the last 168 hours. Lipid Profile: No results for input(s): "CHOL", "HDL", "LDLCALC", "TRIG", "CHOLHDL", "LDLDIRECT" in the last 72 hours. Thyroid Function Tests: No results for input(s): "TSH", "T4TOTAL", "FREET4", "T3FREE", "THYROIDAB" in the last 72 hours. Anemia Panel: No results for input(s): "VITAMINB12", "FOLATE", "FERRITIN", "TIBC", "IRON", "RETICCTPCT" in the last 72 hours. Urine analysis:    Component Value Date/Time   COLORURINE YELLOW 04/16/2023 0616   APPEARANCEUR CLEAR 04/16/2023 0616   LABSPEC 1.016 04/16/2023 0616   PHURINE 6.0 04/16/2023 0616   GLUCOSEU 150 (A) 04/16/2023  0616   HGBUR NEGATIVE 04/16/2023 0616   BILIRUBINUR NEGATIVE 04/16/2023 0616   KETONESUR NEGATIVE 04/16/2023 0616   PROTEINUR NEGATIVE 04/16/2023 0616   UROBILINOGEN 0.2 01/29/2019 1634   NITRITE NEGATIVE 04/16/2023 0616   LEUKOCYTESUR NEGATIVE 04/16/2023 0616    Radiological Exams on Admission: CT Angio Chest PE  W and/or Wo Contrast Result Date: 08/09/2023 CLINICAL DATA:  High probability pulmonary embolism. Shortness of breath. History of lung cancer, hypoxia, tachycardia. EXAM: CT ANGIOGRAPHY CHEST WITH CONTRAST TECHNIQUE: Multidetector CT imaging of the chest was performed using the standard protocol during bolus administration of intravenous contrast. Multiplanar CT image reconstructions and MIPs were obtained to evaluate the vascular anatomy. RADIATION DOSE REDUCTION: This exam was performed according to the departmental dose-optimization program which includes automated exposure control, adjustment of the mA and/or kV according to patient size and/or use of iterative reconstruction technique. CONTRAST:  75mL OMNIPAQUE  IOHEXOL  350 MG/ML SOLN COMPARISON:  Chest radiograph 08/09/2023 and CT chest 05/19/2023 FINDINGS: Cardiovascular: Normal heart size. No pericardial effusion. Negative for acute pulmonary embolism. Normal caliber thoracic aorta. No evidence of aortic dissection. Coronary artery and aortic atherosclerotic calcification. Mediastinum/Nodes: Debris in the posterior trachea. Unremarkable esophagus. Unchanged 1.1 cm right hilar node on series 7/image 193 and 2.5 cm subcarinal confluent node on 7/209. Lungs/Pleura: Advanced emphysema with bullous change. Upper Abdomen: No acute abnormality. Partially calcified superior segment right lower lobe nodule is unchanged measuring 2.0 by 1.3 cm (series 6/image 93). Similar right lower lobe posterior basal mass measuring 4.9 x 7.0 cm (series 6/image 102). Similar bronchial wall thickening and mucous plugging and the bilateral lower lobes and right  middle lobe bronchi. Probable small right pleural effusion. No pneumothorax. Musculoskeletal: Since 05/19/2023 there is a new compression fracture of T7 with 50% vertebral body height loss centrally. No retropulsion. No evidence of underlying metastatic lesion. Review of the MIP images confirms the above findings. IMPRESSION: 1. Negative for acute pulmonary embolism. 2. Similar right lower lobe posterior basal mass, right hilar and mediastinal lymphadenopathy compared to 05/19/2023. 3. Similar bronchial wall thickening and mucous plugging in the bilateral lower lobes and right middle lobe bronchi. 4. Small right pleural effusion. 5. Since 05/19/2023 there is a new compression fracture of T7 with 50% vertebral body height loss centrally. No retropulsion. No evidence of underlying metastatic lesion. Aortic Atherosclerosis (ICD10-I70.0) and Emphysema (ICD10-J43.9). Electronically Signed   By: Rozell Cornet M.D.   On: 08/09/2023 22:28   DG Chest Portable 1 View Result Date: 08/09/2023 CLINICAL DATA:  Shortness of breath. EXAM: PORTABLE CHEST 1 VIEW COMPARISON:  October 09, 2022 FINDINGS: The heart size and mediastinal contours are within normal limits. There is evidence of extensive emphysematous lung disease. A mild component of superimposed atelectasis and/or infiltrate is noted within the mid left lung. There is moderate severity right basilar scarring, atelectasis and/or infiltrate. A component of the patient's known right basilar lung mass is also noted. Mild left basilar linear scarring and/or atelectasis is seen. There is a small right pleural effusion. No pneumothorax is identified. Radiopaque surgical clips are seen within the right upper quadrant. The visualized skeletal structures are unremarkable. IMPRESSION: 1. Extensive emphysematous lung disease with a mild component of superimposed atelectasis and/or infiltrate within the mid left lung. 2. Moderate severity right basilar scarring, atelectasis and/or  infiltrate with a component of the patient's known right basilar lung mass. 3. Small right pleural effusion. Electronically Signed   By: Virgle Grime M.D.   On: 08/09/2023 20:50    Assessment and Plan  Acute COPD exacerbation Sepsis Patient is presenting with 1 week history of progressively worsening dyspnea, wheezing, and cough productive of yellow sputum.  WBC count 15.0, lactate 2.8.  No hypotension.  COVID/influenza/RSV PCR negative.  CTA chest negative for PE.  Showing stable right lower lobe posterior basal mass, right hilar  and mediastinal lymphadenopathy since previous CT from 05/19/2023.  Also showing similar bronchial wall thickening and mucous plugging in the bilateral lower lobes and right middle lobe bronchi and small right pleural effusion.  Continue antibiotics for now and check procalcitonin level.  Trend WBC count and lactate.  Follow-up blood cultures.  Sputum Gram stain and culture ordered.  Continue treatment with Solu-Medrol  40 mg every 12 hours, DuoNeb every 6 hours, Pulmicort  neb twice daily, albuterol  neb every 2 hours PRN, and hypertonic saline nebs.  Pulmonary hygiene.  Chronic hypoxemic respiratory failure on 2 L home oxygen  No change in oxygen  requirement from baseline.  Continuous pulse ox, continue supplemental oxygen .  Chest pain Appears atypical.  Workup not suggestive of ACS or PE.  Patient appears comfortable on exam.  Generalized abdominal pain Patient is complaining of generalized abdominal pain and family reporting constipation.  Unclear when he last had a bowel movement.  No nausea or vomiting reported.  Does have abdominal distention on exam.  Stat KUB ordered to assess for signs of bowel obstruction versus ileus.  Malignant neoplasm of lower lobe of right lung status post SBRT CT showing stable findings.  Outpatient follow-up with pulmonology/radiation oncology  Vertebral compression fracture Back pain CT showing a new compression fracture of T7 with  50% vertebral body height loss centrally compared to previous CT from 05/19/2023.  No retropulsion or evidence of underlying metastatic lesion seen.  No falls reported although per family patient did lift a heavy object/twin bed at home.  Continue pain management, PT/OT, fall precautions.  Hypertension Currently normotensive.  Continue amlodipine  and Coreg .  Hyperlipidemia Continue Lipitor.  Schizophrenia Continue home medications.  GERD Continue PPI.  BPH Continue Flomax .  Type 2 diabetes Patient is on Januvia at home, last A1c unknown.  Hold oral hypoglycemic agent at this time and check A1c.  Placed on sensitive sliding scale insulin ACHS.  DVT prophylaxis: SCDs until KUB is done Code Status: DNR/DNI (discussed with the patient and his sister) Family Communication: Sister at bedside Level of care: Progressive Care Unit Admission status: It is my clinical opinion that referral for OBSERVATION is reasonable and necessary in this patient based on the above information provided. The aforementioned taken together are felt to place the patient at high risk for further clinical deterioration. However, it is anticipated that the patient may be medically stable for discharge from the hospital within 24 to 48 hours.  Juliette Oh MD [facility]  If 7PM-7AM, please contact night-coverage [email or web address]  08/09/2023, 11:46 PM

## 2023-08-09 NOTE — ED Notes (Signed)
 Patient transported to CT

## 2023-08-09 NOTE — H&P (Incomplete)
 History and Physical    Blake Mcknight NFA:[id number] DOB: 1955/10/08 DOA: 08/09/2023  PCP: Jacqueline Matsu, MD  Patient coming from: Home  Chief Complaint: Shortness of breath  HPI: Blake Mcknight is a 68 y.o. male with medical history significant of COPD, chronic hypoxemic respiratory failure on 2 L home oxygen , malignant neoplasm of lower lobe of right lung status post SBRT, hypertension, hyperlipidemia, schizophrenia, GERD, BPH  ED Course: Patient was wheezing and EMS gave 2 nebulizer treatments prior to arrival.  Tachycardic and tachypneic on arrival to the ED.  Afebrile.  Satting well on 2 L [place].  Labs showing WBC count 15.0, hemoglobin 12.8 (stable), sodium 132 (chronically low and stable), troponin negative x 2, blood cultures in process, COVID/influenza/RSV PCR negative, lactic acid 2.8.  CTA chest negative for PE.  Showing stable right lower lobe posterior basal mass, right hilar and mediastinal lymphadenopathy since previous CT from 05/19/2023.  Also showing similar bronchial wall thickening and mucous plugging in the bilateral lower lobes and right middle lobe bronchi and small right pleural effusion.  In addition, there is a new compression fracture of T7 with 50% vertebral body height loss centrally compared to previous CT from 05/19/2023.  No retropulsion or evidence of underlying metastatic lesion seen.  EKG showing sinus tachycardia and no acute ischemic changes.  Patient was given DuoNeb x 2, Solu-Medrol  125 mg, ceftriaxone , azithromycin , 1 L normal saline, and albuterol  continuous neb treatment.  Review of Systems:  ROS  Past Medical History:  Diagnosis Date  . COPD (chronic obstructive pulmonary disease) (HCC)    with bullous emphysema.   Aaron Aas Heavy cigarette smoker before 2003   pt claims only 10 cigs per day, never heavier amounts.   Aaron Aas HLD (hyperlipidemia)   . Hypertension   . Schizophrenia (HCC) 06/28/2013   This is a chronic condition and he lives with family     Past Surgical History:  Procedure Laterality Date  . CHOLECYSTECTOMY N/A 06/06/2013   Procedure: LAPAROSCOPIC CHOLECYSTECTOMY WITH ATTEMPTED INTRAOPERATIVE CHOLANGIOGRAM;  Surgeon: Cloyce Darby, MD;  Location: [facility] OR;  Service: General;  Laterality: N/A;  . ERCP N/A 06/03/2013   Procedure: ENDOSCOPIC RETROGRADE CHOLANGIOPANCREATOGRAPHY (ERCP);  Surgeon: Asencion Blacksmith, MD;  Location: [facility] ENDOSCOPY;  Service: Endoscopy;  Laterality: N/A;     reports that he has quit smoking. His smoking use included cigarettes. He has a 41 pack-year smoking history. He has never used smokeless tobacco. He reports that he does not drink alcohol and does not use drugs.  No Known Allergies  Family History  Problem Relation Age of Onset  . Diabetes Mellitus II Mother   . Diabetes Mother   . Heart disease Father   . Stroke Maternal Aunt   . CAD Neg Hx     Prior to Admission medications   Medication Sig Start Date End Date Taking? Authorizing Provider  albuterol  (VENTOLIN  HFA) 108 (90 Base) MCG/ACT inhaler Inhale 2 puffs into the lungs every 4 (four) hours as needed for wheezing or shortness of breath. 10/02/22  Yes Guadalupe Lee, MD  amLODipine  (NORVASC ) 5 MG tablet Take 5 mg by mouth daily. 06/26/21  Yes [provider]  atorvastatin  (LIPITOR) 10 MG tablet Take 10 mg by mouth at bedtime. 09/21/15  Yes [provider]  benztropine  (COGENTIN ) 2 MG tablet Take 1 tablet (2 mg total) by mouth 2 (two) times daily. 07/30/23  Yes Arfeen, Bronson Canny, MD  budesonide -glycopyrrolate -formoterol  (BREZTRI AEROSPHERE) 160-9-4.8 MCG/ACT AERO inhaler Inhale 1-2 puffs into  the lungs 2 (two) times daily.   Yes [provider]  carvedilol  (COREG ) 3.125 MG tablet Take 1 tablet (3.125 mg total) by mouth 2 (two) times daily with a meal. 02/20/19  Yes Audria Leather, MD  cetirizine (ZYRTEC) 10 MG tablet Take 10 mg by mouth daily as needed for allergies or rhinitis.   Yes [provider]   cholecalciferol (VITAMIN D) 25 MCG (1000 UNIT) tablet Take 5,000 Units by mouth every morning.   Yes [provider]  famotidine  (PEPCID ) 40 MG tablet Take 40 mg by mouth 2 (two) times daily. 12/28/18  Yes [provider]  ipratropium-albuterol  (DUONEB) 0.5-2.5 (3) MG/3ML SOLN Take 3 mLs by nebulization every 4 (four) hours. 07/13/23  Yes Byrum, Delora Ferry, MD  JANUVIA 100 MG tablet Take 100 mg by mouth daily. 01/30/23  Yes [provider]  mirtazapine  (REMERON ) 30 MG tablet Take 1 tablet (30 mg total) by mouth at bedtime as needed (insomnia). Patient taking differently: Take 30 mg by mouth at bedtime. 07/30/23 07/29/24 Yes Arfeen, Bronson Canny, MD  OXYGEN  Inhale 2 L/min into the lungs continuous.    Yes [provider]  polyethylene glycol (MIRALAX  / GLYCOLAX ) 17 g packet Take 17 g by mouth daily as needed for mild constipation.   Yes [provider]  potassium chloride  SA (KLOR-CON  M) 20 MEQ tablet Take 2 tablets (40 mEq total) by mouth daily. Patient taking differently: Take 20 mEq by mouth 2 (two) times daily. 04/18/23  Yes Leona Rake, MD  risperiDONE  (RISPERDAL ) 3 MG tablet Take 1 tablet (3 mg total) by mouth 2 (two) times daily. 07/30/23  Yes Arfeen, Bronson Canny, MD  sodium chloride  (OCEAN) 0.65 % SOLN nasal spray Place 1 spray into both nostrils as needed for congestion.   Yes [provider]  tamsulosin  (FLOMAX ) 0.4 MG CAPS capsule Take 0.4 mg by mouth in the morning and at bedtime. 07/23/20  Yes [provider]  castor oil liquid Take 10 mLs by mouth daily as needed for moderate constipation or mild constipation. Patient not taking: Reported on 08/09/2023    [provider]  MILK THISTLE PO Take 1 capsule by mouth daily. Patient not taking: Reported on 08/09/2023    [provider]  multivitamin (ONE-A-DAY MEN'S) TABS tablet Take 1 tablet by mouth daily. Patient not taking: Reported on 08/09/2023    [provider]     Physical Exam: Vitals:   08/09/23 2200 08/09/23 2215 08/09/23 2230 08/09/23 2245  BP: (!) 134/90 128/85 125/86 122/89  Pulse: (!) 110 (!) 102 (!) 103 (!) 102  Resp: (!) 28 17 (!) 29 (!) 22  Temp:      TempSrc:      SpO2: 99% 98% 99% 99%  Weight:      Height:        Physical Exam  Labs on Admission: I have personally reviewed following labs and imaging studies  CBC: Recent Labs  Lab 08/09/23 2047  WBC 15.0*  NEUTROABS 12.6*  HGB 12.8*  HCT 39.8  MCV 83.6  PLT 306   Basic Metabolic Panel: Recent Labs  Lab 08/09/23 2047  NA 132*  K 4.0  CL 93*  CO2 25  GLUCOSE 155*  BUN 9  CREATININE 0.61  CALCIUM  10.2   GFR: Estimated Creatinine Clearance: 83.1 mL/min (by C-G formula based on SCr of 0.61 mg/dL). Liver Function Tests: No results for input(s): "AST", "ALT", "ALKPHOS", "BILITOT", "PROT", "ALBUMIN" in the last 168 hours. No results  for input(s): "LIPASE", "AMYLASE" in the last 168 hours. No results for input(s): "AMMONIA" in the last 168 hours. Coagulation Profile: No results for input(s): "INR", "PROTIME" in the last 168 hours. Cardiac Enzymes: No results for input(s): "CKTOTAL", "CKMB", "CKMBINDEX", "TROPONINI" in the last 168 hours. BNP (last 3 results) No results for input(s): "PROBNP" in the last 8760 hours. HbA1C: No results for input(s): "HGBA1C" in the last 72 hours. CBG: No results for input(s): "GLUCAP" in the last 168 hours. Lipid Profile: No results for input(s): "CHOL", "HDL", "LDLCALC", "TRIG", "CHOLHDL", "LDLDIRECT" in the last 72 hours. Thyroid Function Tests: No results for input(s): "TSH", "T4TOTAL", "FREET4", "T3FREE", "THYROIDAB" in the last 72 hours. Anemia Panel: No results for input(s): "VITAMINB12", "FOLATE", "FERRITIN", "TIBC", "IRON", "RETICCTPCT" in the last 72 hours. Urine analysis:    Component Value Date/Time   COLORURINE YELLOW 04/16/2023 0616   APPEARANCEUR CLEAR 04/16/2023 0616   LABSPEC 1.016 04/16/2023 0616    PHURINE 6.0 04/16/2023 0616   GLUCOSEU 150 (A) 04/16/2023 0616   HGBUR NEGATIVE 04/16/2023 0616   BILIRUBINUR NEGATIVE 04/16/2023 0616   KETONESUR NEGATIVE 04/16/2023 0616   PROTEINUR NEGATIVE 04/16/2023 0616   UROBILINOGEN 0.2 01/29/2019 1634   NITRITE NEGATIVE 04/16/2023 0616   LEUKOCYTESUR NEGATIVE 04/16/2023 0616    Radiological Exams on Admission: CT Angio Chest PE W and/or Wo Contrast Result Date: 08/09/2023 CLINICAL DATA:  High probability pulmonary embolism. Shortness of breath. History of lung cancer, hypoxia, tachycardia. EXAM: CT ANGIOGRAPHY CHEST WITH CONTRAST TECHNIQUE: Multidetector CT imaging of the chest was performed using the standard protocol during bolus administration of intravenous contrast. Multiplanar CT image reconstructions and MIPs were obtained to evaluate the vascular anatomy. RADIATION DOSE REDUCTION: This exam was performed according to the departmental dose-optimization program which includes automated exposure control, adjustment of the mA and/or kV according to patient size and/or use of iterative reconstruction technique. CONTRAST:  75mL OMNIPAQUE  IOHEXOL  350 MG/ML SOLN COMPARISON:  Chest radiograph 08/09/2023 and CT chest 05/19/2023 FINDINGS: Cardiovascular: Normal heart size. No pericardial effusion. Negative for acute pulmonary embolism. Normal caliber thoracic aorta. No evidence of aortic dissection. Coronary artery and aortic atherosclerotic calcification. Mediastinum/Nodes: Debris in the posterior trachea. Unremarkable esophagus. Unchanged 1.1 cm right hilar node on series 7/image 193 and 2.5 cm subcarinal confluent node on 7/209. Lungs/Pleura: Advanced emphysema with bullous change. Upper Abdomen: No acute abnormality. Partially calcified superior segment right lower lobe nodule is unchanged measuring 2.0 by 1.3 cm (series 6/image 93). Similar right lower lobe posterior basal mass measuring 4.9 x 7.0 cm (series 6/image 102). Similar bronchial wall thickening and  mucous plugging and the bilateral lower lobes and right middle lobe bronchi. Probable small right pleural effusion. No pneumothorax. Musculoskeletal: Since 05/19/2023 there is a new compression fracture of T7 with 50% vertebral body height loss centrally. No retropulsion. No evidence of underlying metastatic lesion. Review of the MIP images confirms the above findings. IMPRESSION: 1. Negative for acute pulmonary embolism. 2. Similar right lower lobe posterior basal mass, right hilar and mediastinal lymphadenopathy compared to 05/19/2023. 3. Similar bronchial wall thickening and mucous plugging in the bilateral lower lobes and right middle lobe bronchi. 4. Small right pleural effusion. 5. Since 05/19/2023 there is a new compression fracture of T7 with 50% vertebral body height loss centrally. No retropulsion. No evidence of underlying metastatic lesion. Aortic Atherosclerosis (ICD10-I70.0) and Emphysema (ICD10-J43.9). Electronically Signed   By: Rozell Cornet M.D.   On: 08/09/2023 22:28   DG Chest Portable 1 View Result Date: 08/09/2023 CLINICAL  DATA:  Shortness of breath. EXAM: PORTABLE CHEST 1 VIEW COMPARISON:  October 09, 2022 FINDINGS: The heart size and mediastinal contours are within normal limits. There is evidence of extensive emphysematous lung disease. A mild component of superimposed atelectasis and/or infiltrate is noted within the mid left lung. There is moderate severity right basilar scarring, atelectasis and/or infiltrate. A component of the patient's known right basilar lung mass is also noted. Mild left basilar linear scarring and/or atelectasis is seen. There is a small right pleural effusion. No pneumothorax is identified. Radiopaque surgical clips are seen within the right upper quadrant. The visualized skeletal structures are unremarkable. IMPRESSION: 1. Extensive emphysematous lung disease with a mild component of superimposed atelectasis and/or infiltrate within the mid left lung. 2.  Moderate severity right basilar scarring, atelectasis and/or infiltrate with a component of the patient's known right basilar lung mass. 3. Small right pleural effusion. Electronically Signed   By: Virgle Grime M.D.   On: 08/09/2023 20:50    EKG: Independently reviewed. ***  Assessment and Plan  DVT prophylaxis: {Blank single:19197::"Lovenox ","SQ Heparin","IV heparin gtt","Xarelto","Eliquis","Coumadin","SCDs","***"} Code Status: {Blank single:19197::"Full Code","DNR","DNR/DNI","Comfort Care","***"} Family Communication: ***  Consults called: ***  Level of care: {Blank single:19197::"Med-Surg","Telemetry bed","Progressive Care Unit","Step Down Unit"} Admission status: *** Time Spent: 75+ minutes***  Juliette Oh MD [facility]  If 7PM-7AM, please contact night-coverage [email or web address]  08/09/2023, 11:46 PM

## 2023-08-10 ENCOUNTER — Observation Stay (HOSPITAL_COMMUNITY)

## 2023-08-10 DIAGNOSIS — E876 Hypokalemia: Secondary | ICD-10-CM | POA: Diagnosis not present

## 2023-08-10 DIAGNOSIS — R079 Chest pain, unspecified: Secondary | ICD-10-CM

## 2023-08-10 DIAGNOSIS — Z79899 Other long term (current) drug therapy: Secondary | ICD-10-CM | POA: Diagnosis not present

## 2023-08-10 DIAGNOSIS — N281 Cyst of kidney, acquired: Secondary | ICD-10-CM | POA: Diagnosis not present

## 2023-08-10 DIAGNOSIS — R1084 Generalized abdominal pain: Secondary | ICD-10-CM | POA: Diagnosis not present

## 2023-08-10 DIAGNOSIS — F209 Schizophrenia, unspecified: Secondary | ICD-10-CM | POA: Diagnosis present

## 2023-08-10 DIAGNOSIS — K219 Gastro-esophageal reflux disease without esophagitis: Secondary | ICD-10-CM | POA: Diagnosis present

## 2023-08-10 DIAGNOSIS — Z1152 Encounter for screening for COVID-19: Secondary | ICD-10-CM | POA: Diagnosis not present

## 2023-08-10 DIAGNOSIS — J441 Chronic obstructive pulmonary disease with (acute) exacerbation: Secondary | ICD-10-CM | POA: Diagnosis present

## 2023-08-10 DIAGNOSIS — K769 Liver disease, unspecified: Secondary | ICD-10-CM | POA: Diagnosis not present

## 2023-08-10 DIAGNOSIS — J439 Emphysema, unspecified: Secondary | ICD-10-CM | POA: Diagnosis present

## 2023-08-10 DIAGNOSIS — Z8249 Family history of ischemic heart disease and other diseases of the circulatory system: Secondary | ICD-10-CM | POA: Diagnosis not present

## 2023-08-10 DIAGNOSIS — Z87891 Personal history of nicotine dependence: Secondary | ICD-10-CM | POA: Diagnosis not present

## 2023-08-10 DIAGNOSIS — C3431 Malignant neoplasm of lower lobe, right bronchus or lung: Secondary | ICD-10-CM | POA: Diagnosis present

## 2023-08-10 DIAGNOSIS — Z515 Encounter for palliative care: Secondary | ICD-10-CM | POA: Diagnosis not present

## 2023-08-10 DIAGNOSIS — I10 Essential (primary) hypertension: Secondary | ICD-10-CM | POA: Diagnosis present

## 2023-08-10 DIAGNOSIS — J9611 Chronic respiratory failure with hypoxia: Secondary | ICD-10-CM | POA: Diagnosis present

## 2023-08-10 DIAGNOSIS — X500XXA Overexertion from strenuous movement or load, initial encounter: Secondary | ICD-10-CM | POA: Diagnosis not present

## 2023-08-10 DIAGNOSIS — Z66 Do not resuscitate: Secondary | ICD-10-CM | POA: Diagnosis present

## 2023-08-10 DIAGNOSIS — Z7984 Long term (current) use of oral hypoglycemic drugs: Secondary | ICD-10-CM | POA: Diagnosis not present

## 2023-08-10 DIAGNOSIS — Z7951 Long term (current) use of inhaled steroids: Secondary | ICD-10-CM | POA: Diagnosis not present

## 2023-08-10 DIAGNOSIS — R140 Abdominal distension (gaseous): Secondary | ICD-10-CM | POA: Diagnosis not present

## 2023-08-10 DIAGNOSIS — T380X5A Adverse effect of glucocorticoids and synthetic analogues, initial encounter: Secondary | ICD-10-CM | POA: Diagnosis present

## 2023-08-10 DIAGNOSIS — E785 Hyperlipidemia, unspecified: Secondary | ICD-10-CM | POA: Diagnosis present

## 2023-08-10 DIAGNOSIS — N400 Enlarged prostate without lower urinary tract symptoms: Secondary | ICD-10-CM | POA: Diagnosis not present

## 2023-08-10 DIAGNOSIS — Z9981 Dependence on supplemental oxygen: Secondary | ICD-10-CM | POA: Diagnosis not present

## 2023-08-10 DIAGNOSIS — M4854XA Collapsed vertebra, not elsewhere classified, thoracic region, initial encounter for fracture: Secondary | ICD-10-CM | POA: Diagnosis present

## 2023-08-10 DIAGNOSIS — E119 Type 2 diabetes mellitus without complications: Secondary | ICD-10-CM | POA: Diagnosis present

## 2023-08-10 DIAGNOSIS — D72828 Other elevated white blood cell count: Secondary | ICD-10-CM | POA: Diagnosis present

## 2023-08-10 DIAGNOSIS — Z833 Family history of diabetes mellitus: Secondary | ICD-10-CM | POA: Diagnosis not present

## 2023-08-10 HISTORY — DX: Chest pain, unspecified: R07.9

## 2023-08-10 LAB — GLUCOSE, CAPILLARY
Glucose-Capillary: 171 mg/dL — ABNORMAL HIGH (ref 70–99)
Glucose-Capillary: 142 mg/dL — ABNORMAL HIGH (ref 70–99)

## 2023-08-10 LAB — CBC
HCT: 38.3 % — ABNORMAL LOW (ref 39.0–52.0)
Hemoglobin: 12.2 g/dL — ABNORMAL LOW (ref 13.0–17.0)
MCH: 26.6 pg (ref 26.0–34.0)
MCHC: 31.9 g/dL (ref 30.0–36.0)
MCV: 83.6 fL (ref 80.0–100.0)
Platelets: 304 10*3/uL (ref 150–400)
RBC: 4.58 MIL/uL (ref 4.22–5.81)
RDW: 17.4 % — ABNORMAL HIGH (ref 11.5–15.5)
WBC: 12.7 10*3/uL — ABNORMAL HIGH (ref 4.0–10.5)
nRBC: 0 % (ref 0.0–0.2)

## 2023-08-10 LAB — HEMOGLOBIN A1C
Hgb A1c MFr Bld: 5.9 % — ABNORMAL HIGH (ref 4.8–5.6)
Mean Plasma Glucose: 122.63 mg/dL

## 2023-08-10 LAB — CBG MONITORING, ED
Glucose-Capillary: 188 mg/dL — ABNORMAL HIGH (ref 70–99)
Glucose-Capillary: 132 mg/dL — ABNORMAL HIGH (ref 70–99)
Glucose-Capillary: 182 mg/dL — ABNORMAL HIGH (ref 70–99)

## 2023-08-10 LAB — HEPATIC FUNCTION PANEL
ALT: 22 U/L (ref 0–44)
AST: 25 U/L (ref 15–41)
Albumin: 2.8 g/dL — ABNORMAL LOW (ref 3.5–5.0)
Alkaline Phosphatase: 54 U/L (ref 38–126)
Bilirubin, Direct: 0.2 mg/dL (ref 0.0–0.2)
Indirect Bilirubin: 0.3 mg/dL (ref 0.3–0.9)
Total Bilirubin: 0.5 mg/dL (ref 0.0–1.2)
Total Protein: 7 g/dL (ref 6.5–8.1)

## 2023-08-10 LAB — LIPASE, BLOOD: Lipase: 27 U/L (ref 11–51)

## 2023-08-10 LAB — I-STAT CG4 LACTIC ACID, ED: Lactic Acid, Venous: 3.2 mmol/L (ref 0.5–1.9)

## 2023-08-10 LAB — PROCALCITONIN: Procalcitonin: <0.1 ng/mL

## 2023-08-10 MED ORDER — TAMSULOSIN HCL 0.4 MG PO CAPS
0.4000 mg | ORAL_CAPSULE | Freq: Two times a day (BID) | ORAL | Status: DC
  Administered 2023-08-10 – 2023-08-14 (×10): 0.4 mg via ORAL
  Filled 2023-08-10 (×14): qty 1

## 2023-08-10 MED ORDER — METHYLPREDNISOLONE SODIUM SUCC 40 MG IJ SOLR
40.0000 mg | Freq: Two times a day (BID) | INTRAMUSCULAR | Status: DC
  Administered 2023-08-10 – 2023-08-12 (×5): 40 mg via INTRAVENOUS
  Filled 2023-08-10 (×4): qty 1

## 2023-08-10 MED ORDER — INSULIN ASPART 100 UNIT/ML IJ SOLN
0.0000 [IU] | Freq: Three times a day (TID) | INTRAMUSCULAR | Status: DC
  Administered 2023-08-10: 2 [IU] via SUBCUTANEOUS
  Administered 2023-08-10: 1 [IU] via SUBCUTANEOUS
  Administered 2023-08-10 – 2023-08-11 (×2): 2 [IU] via SUBCUTANEOUS

## 2023-08-10 MED ORDER — ALBUTEROL SULFATE (2.5 MG/3ML) 0.083% IN NEBU
2.5000 mg | INHALATION_SOLUTION | RESPIRATORY_TRACT | Status: DC | PRN
  Administered 2023-08-10 – 2023-08-14 (×9): 2.5 mg via RESPIRATORY_TRACT
  Filled 2023-08-10 (×25): qty 3

## 2023-08-10 MED ORDER — AMLODIPINE BESYLATE 5 MG PO TABS
5.0000 mg | ORAL_TABLET | Freq: Every day | ORAL | Status: DC
Start: 2023-08-10 — End: 2023-08-14
  Administered 2023-08-10 – 2023-08-14 (×5): 5 mg via ORAL
  Filled 2023-08-10 (×7): qty 1

## 2023-08-10 MED ORDER — NALOXONE HCL 0.4 MG/ML IJ SOLN: 0.4000 mg | INTRAMUSCULAR | Status: DC | PRN

## 2023-08-10 MED ORDER — MORPHINE SULFATE (PF) 2 MG/ML IV SOLN: 1.0000 mg | INTRAVENOUS | Status: DC | PRN

## 2023-08-10 MED ORDER — BENZTROPINE MESYLATE 2 MG PO TABS
2.0000 mg | ORAL_TABLET | Freq: Two times a day (BID) | ORAL | Status: DC
  Administered 2023-08-10 – 2023-08-14 (×10): 2 mg via ORAL
  Filled 2023-08-10 (×14): qty 1

## 2023-08-10 MED ORDER — MIRTAZAPINE 30 MG PO TABS
30.0000 mg | ORAL_TABLET | Freq: Every day | ORAL | Status: DC
  Administered 2023-08-10 – 2023-08-13 (×5): 30 mg via ORAL
  Filled 2023-08-10: qty 2
  Filled 2023-08-10: qty 1
  Filled 2023-08-10 (×3): qty 2
  Filled 2023-08-10 (×2): qty 1

## 2023-08-10 MED ORDER — SODIUM CHLORIDE 0.9 % IV SOLN
INTRAVENOUS | Status: AC
Start: 2023-08-10 — End: 2023-08-11

## 2023-08-10 MED ORDER — ATORVASTATIN CALCIUM 10 MG PO TABS
10.0000 mg | ORAL_TABLET | Freq: Every day | ORAL | Status: DC
  Administered 2023-08-10 – 2023-08-13 (×5): 10 mg via ORAL
  Filled 2023-08-10 (×7): qty 1

## 2023-08-10 MED ORDER — BUDESONIDE 0.25 MG/2ML IN SUSP
0.2500 mg | Freq: Two times a day (BID) | RESPIRATORY_TRACT | Status: DC
  Administered 2023-08-10 – 2023-08-14 (×9): 0.25 mg via RESPIRATORY_TRACT
  Filled 2023-08-10 (×14): qty 2

## 2023-08-10 MED ORDER — SODIUM CHLORIDE 0.9 % IV SOLN
1.0000 g | INTRAVENOUS | Status: AC
  Administered 2023-08-10 – 2023-08-13 (×4): 1 g via INTRAVENOUS
  Filled 2023-08-10 (×5): qty 10

## 2023-08-10 MED ORDER — RISPERIDONE 3 MG PO TABS
3.0000 mg | ORAL_TABLET | Freq: Two times a day (BID) | ORAL | Status: DC
  Administered 2023-08-10 – 2023-08-14 (×10): 3 mg via ORAL
  Filled 2023-08-10 (×14): qty 1

## 2023-08-10 MED ORDER — SODIUM CHLORIDE 3 % IN NEBU
4.0000 mL | INHALATION_SOLUTION | Freq: Two times a day (BID) | RESPIRATORY_TRACT | Status: DC
  Administered 2023-08-10 – 2023-08-12 (×6): 4 mL via RESPIRATORY_TRACT
  Filled 2023-08-10 (×8): qty 4

## 2023-08-10 MED ORDER — CARVEDILOL 3.125 MG PO TABS
3.1250 mg | ORAL_TABLET | Freq: Two times a day (BID) | ORAL | Status: DC
  Administered 2023-08-10 – 2023-08-14 (×11): 3.125 mg via ORAL
  Filled 2023-08-10 (×14): qty 1

## 2023-08-10 MED ORDER — INSULIN ASPART 100 UNIT/ML IJ SOLN: 0.0000 [IU] | Freq: Every day | INTRAMUSCULAR | Status: DC

## 2023-08-10 MED ORDER — POLYETHYLENE GLYCOL 3350 17 G PO PACK: 17.0000 g | PACK | Freq: Every day | ORAL | Status: DC | PRN

## 2023-08-10 MED ORDER — FAMOTIDINE 20 MG PO TABS
40.0000 mg | ORAL_TABLET | Freq: Two times a day (BID) | ORAL | Status: DC
Start: 2023-08-10 — End: 2023-08-12
  Administered 2023-08-10 – 2023-08-11 (×5): 40 mg via ORAL
  Filled 2023-08-10 (×5): qty 2

## 2023-08-10 MED ORDER — SODIUM CHLORIDE 0.9 % IV SOLN
500.0000 mg | INTRAVENOUS | Status: DC
  Administered 2023-08-10: 500 mg via INTRAVENOUS
  Filled 2023-08-10: qty 5

## 2023-08-10 MED ORDER — LIDOCAINE 5 % EX PTCH
1.0000 | MEDICATED_PATCH | CUTANEOUS | Status: DC
  Administered 2023-08-10: 1 via TRANSDERMAL
  Filled 2023-08-10: qty 1

## 2023-08-10 MED ORDER — IPRATROPIUM-ALBUTEROL 0.5-2.5 (3) MG/3ML IN SOLN
3.0000 mL | Freq: Two times a day (BID) | RESPIRATORY_TRACT | Status: DC
  Administered 2023-08-11 – 2023-08-12 (×3): 3 mL via RESPIRATORY_TRACT
  Filled 2023-08-10 (×3): qty 3

## 2023-08-10 MED ORDER — IPRATROPIUM-ALBUTEROL 0.5-2.5 (3) MG/3ML IN SOLN
3.0000 mL | Freq: Four times a day (QID) | RESPIRATORY_TRACT | Status: DC
  Administered 2023-08-10 (×4): 3 mL via RESPIRATORY_TRACT
  Filled 2023-08-10 (×4): qty 3

## 2023-08-10 NOTE — Progress Notes (Signed)
 PROGRESS NOTE    Blake Mcknight  QMV:[id number] DOB: 1955-10-30 DOA: 08/09/2023 PCP: Jacqueline Matsu, MD   Brief Narrative:  This 68 yrs old male with PMH significant for COPD, chronic hypoxemic respiratory failure on 2 L home oxygen , malignant neoplasm of right lung status post SBRT, hypertension, hyperlipidemia, schizophrenia, GERD, BPH, type 2 diabetes presenting with a chief complaint of shortness of breath.  Patient has progressive worsening of shortness of breath, wheezing and productive cough of yellow sputum for last 1 week.  Patient has been using nebulizer 4 times daily at home without such improvement.  She also developed substernal chest pain for last few days.  Sister states chest pain and back pain could be from heavy lifting as he done recently.  In the ED he was found to have significant wheezing, he was given IV Solu-Medrol ,  magnesium  sulfate and nebulized bronchodilators.  CTA chest negative for PE.  It also shows bronchial wall thickening and mucous plugging in bilateral lower lobes and small right pleural effusion.  There is a new compression fracture of T7 with 50% vertebral body height loss.  No evidence of underlying metastatic lesion.  Patient was admitted for COPD exacerbation.  Assessment & Plan:   Principal Problem:   COPD exacerbation (HCC) Active Problems:   Abdominal pain   Schizophrenia (HCC)   Chronic hypoxemic respiratory failure (HCC)   Chest pain  COPD with acute exacerbation: Suspected sepsis: Patient presented with progressive shortness of breath,  wheezing, and cough productive of yellow sputum.   WBC count 15.0, lactate 2.8.  No hypotension.   COVID / Influenza / RSV PCR negative.  CTA chest negative for PE. It showed stable right lower lobe posterior basal mass, right hilar and mediastinal lymphadenopathy since previous CT from 05/19/2023.  Also showing similar bronchial wall thickening and mucous plugging in the bilateral lower lobes and right  middle lobe bronchi and small right pleural effusion.   Continue empiric antibiotics (ceftriaxone  and Zithromax ).   Trend WBC count and lactate.  Follow-up blood cultures.  Continue IV Solu-Medrol  40 mg every 12 hours, DuoNeb every 6 hours, Pulmicort  neb twice daily, albuterol  neb every 2 hours PRN, and hypertonic saline nebs.  Pulmonary hygiene.   Chronic hypoxic respiratory failure: No change in oxygen  requirement from baseline.   Continuous pulse ox, continue supplemental oxygen .   Chest pain, Atypical: Appears atypical.  Workup not suggestive of ACS or PE.   Patient appears comfortable on exam.   Generalized abdominal pain: Patient reports generalized abdominal pain and family reporting constipation.   Unclear when he last had a bowel movement.No nausea or vomiting reported.   Does have abdominal distention on exam.   X-ray KUB shows gaseous dilatation of small bowel,  could be ileus or obstruction. Consider CT abdomen if concern for obstruction.   Malignant neoplasm of lower lobe of right lung status post SBRT: CT showing stable findings.  Outpatient follow-up with pulmonology/radiation oncology   Vertebral compression fracture: Back pain: CT showing a new compression fracture of T7 with 50% vertebral body height loss centrally compared to previous CT from 05/19/2023.  No retropulsion or evidence of underlying metastatic lesion seen.  No falls reported although per family. Patient did lift a heavy object/twin bed at home.  Continue pain management, PT/OT, fall precautions.   Hypertension: Currently normotensive.   Continue amlodipine  and Coreg .   Hyperlipidemia: Continue Lipitor.   Schizophrenia: Continue home medications.   GERD: Continue PPI.   BPH: Continue Flomax .  Type 2 diabetes: Patient is on Januvia at home, last A1c unknown.   Hold oral hypoglycemic agent at this time and check A1c.   Placed on sensitive sliding scale insulin ACHS.    DVT prophylaxis:  SCDs Code Status: DNR Family Communication: No family at bed side Disposition Plan:    Status is: Observation The patient remains OBS appropriate and will d/c before 2 midnights.  Admitted for COPD exacerbation.   Consultants:  None  Procedures: CTA chest.  Antimicrobials: Anti-infectives (From admission, onward)    Start     Dose/Rate Route Frequency Ordered Stop   08/10/23 2200  cefTRIAXone  (ROCEPHIN ) 1 g in sodium chloride  0.9 % 100 mL IVPB        1 g 200 mL/hr over 30 Minutes Intravenous Every 24 hours 08/10/23 0106     08/10/23 2200  azithromycin  (ZITHROMAX ) 500 mg in sodium chloride  0.9 % 250 mL IVPB        500 mg 250 mL/hr over 60 Minutes Intravenous Every 24 hours 08/10/23 0106     08/09/23 2130  cefTRIAXone  (ROCEPHIN ) 1 g in sodium chloride  0.9 % 100 mL IVPB        1 g 200 mL/hr over 30 Minutes Intravenous  Once 08/09/23 2122 08/09/23 2247   08/09/23 2130  azithromycin  (ZITHROMAX ) 500 mg in sodium chloride  0.9 % 250 mL IVPB        500 mg 250 mL/hr over 60 Minutes Intravenous  Once 08/09/23 2122 08/10/23 0050      Subjective: Patient was seen and examined at bedside. Overnight events noted. Patient remains on baseline oxygen  requirement, States he is feeling better, Still has mild wheezing on examination.  Objective: Vitals:   08/10/23 0540 08/10/23 0700 08/10/23 0900 08/10/23 0955  BP:  127/75 127/85   Pulse:  (!) 114 92   Resp:  18 20   Temp: 98.5 F (36.9 C)   98.4 F (36.9 C)  TempSrc: Axillary   Oral  SpO2:  100% 100%   Weight:      Height:        Intake/Output Summary (Last 24 hours) at 08/10/2023 1021 Last data filed at 08/10/2023 0626 Gross per 24 hour  Intake 100 ml  Output 930 ml  Net -830 ml   Filed Weights   08/09/23 2009  Weight: 74.8 kg    Examination:  General exam: Appears calm and comfortable, not in any acute distress. Respiratory system: CTA Bilaterally. Respiratory effort normal.  RR 15 Cardiovascular system: S1 & S2  heard, RRR. No JVD, murmurs, rubs, gallops or clicks.  Gastrointestinal system: Abdomen is Mildly distended, soft and  Mildly tender. Normal bowel sounds heard. Central nervous system: Alert and oriented x 3. No focal neurological deficits. Extremities: No edema, no cyanosis, no clubbing. Skin: No rashes, lesions or ulcers Psychiatry: Judgement and insight appear normal. Mood & affect appropriate.     Data Reviewed: I have personally reviewed following labs and imaging studies  CBC: Recent Labs  Lab 08/09/23 2047 08/10/23 0229  WBC 15.0* 12.7*  NEUTROABS 12.6*  --   HGB 12.8* 12.2*  HCT 39.8 38.3*  MCV 83.6 83.6  PLT 306 304   Basic Metabolic Panel: Recent Labs  Lab 08/09/23 2047  NA 132*  K 4.0  CL 93*  CO2 25  GLUCOSE 155*  BUN 9  CREATININE 0.61  CALCIUM  10.2   GFR: Estimated Creatinine Clearance: 83.1 mL/min (by C-G formula based on SCr of 0.61 mg/dL). Liver Function Tests:  Recent Labs  Lab 08/10/23 0229  AST 25  ALT 22  ALKPHOS 54  BILITOT 0.5  PROT 7.0  ALBUMIN 2.8*   Recent Labs  Lab 08/10/23 0229  LIPASE 27   No results for input(s): "AMMONIA" in the last 168 hours. Coagulation Profile: No results for input(s): "INR", "PROTIME" in the last 168 hours. Cardiac Enzymes: No results for input(s): "CKTOTAL", "CKMB", "CKMBINDEX", "TROPONINI" in the last 168 hours. BNP (last 3 results) No results for input(s): "PROBNP" in the last 8760 hours. HbA1C: Recent Labs    08/10/23 0229  HGBA1C 5.9*   CBG: Recent Labs  Lab 08/10/23 0213 08/10/23 0733  GLUCAP 188* 132*   Lipid Profile: No results for input(s): "CHOL", "HDL", "LDLCALC", "TRIG", "CHOLHDL", "LDLDIRECT" in the last 72 hours. Thyroid Function Tests: No results for input(s): "TSH", "T4TOTAL", "FREET4", "T3FREE", "THYROIDAB" in the last 72 hours. Anemia Panel: No results for input(s): "VITAMINB12", "FOLATE", "FERRITIN", "TIBC", "IRON", "RETICCTPCT" in the last 72 hours. Sepsis  Labs: Recent Labs  Lab 08/09/23 2136 08/09/23 2359 08/10/23 0229  PROCALCITON  --   --  <0.10  LATICACIDVEN 2.8* 3.2*  --     Recent Results (from the past 240 hours)  Blood culture (routine x 2)     Status: None (Preliminary result)   Collection Time: 08/09/23  8:47 PM   Specimen: BLOOD  Result Value Ref Range Status   Specimen Description BLOOD SITE NOT SPECIFIED  Final   Special Requests   Final    BOTTLES DRAWN AEROBIC AND ANAEROBIC Blood Culture adequate volume   Culture   Final    NO GROWTH < 12 HOURS Performed at [facility], [address] [place], [place] [address]    Report Status PENDING  Incomplete  Resp panel by RT-PCR (RSV, Flu A&B, Covid) Peripheral     Status: None   Collection Time: 08/09/23  8:47 PM   Specimen: Peripheral; Nasal Swab  Result Value Ref Range Status   SARS Coronavirus 2 by RT PCR NEGATIVE NEGATIVE Final   Influenza A by PCR NEGATIVE NEGATIVE Final   Influenza B by PCR NEGATIVE NEGATIVE Final    Comment: (NOTE) The Xpert Xpress SARS-CoV-2/FLU/RSV plus assay is intended as an aid in the diagnosis of influenza from Nasopharyngeal swab specimens and should not be used as a sole basis for treatment. Nasal washings and aspirates are unacceptable for Xpert Xpress SARS-CoV-2/FLU/RSV testing.  Fact Sheet for Patients: BloggerCourse.com  Fact Sheet for Healthcare Providers: SeriousBroker.it  This test is not yet approved or cleared by the [place]  FDA and has been authorized for detection and/or diagnosis of SARS-CoV-2 by FDA under an Emergency Use Authorization (EUA). This EUA will remain in effect (meaning this test can be used) for the duration of the COVID-19 declaration under Section 564(b)(1) of the Act, 21 U.S.C. section 360bbb-3(b)(1), unless the authorization is terminated or revoked.     Resp Syncytial Virus by PCR NEGATIVE NEGATIVE Final    Comment: (NOTE) Fact Sheet  for Patients: BloggerCourse.com  Fact Sheet for Healthcare Providers: SeriousBroker.it  This test is not yet approved or cleared by the [place]  FDA and has been authorized for detection and/or diagnosis of SARS-CoV-2 by FDA under an Emergency Use Authorization (EUA). This EUA will remain in effect (meaning this test can be used) for the duration of the COVID-19 declaration under Section 564(b)(1) of the Act, 21 U.S.C. section 360bbb-3(b)(1), unless the authorization is terminated or revoked.  Performed at [facility], [address], [place], [place] [address]   Blood culture (routine x 2)     Status: None (Preliminary result)   Collection Time: 08/09/23  8:49 PM   Specimen: BLOOD LEFT ARM  Result Value Ref Range Status   Specimen Description BLOOD LEFT ARM  Final   Special Requests   Final    BOTTLES DRAWN AEROBIC AND ANAEROBIC Blood Culture results may not be optimal due to an inadequate volume of blood received in culture bottles   Culture   Final    NO GROWTH < 12 HOURS Performed at [facility], [address] [place], [place] [address]    Report Status PENDING  Incomplete    Radiology Studies: CT ABDOMEN PELVIS WO CONTRAST Result Date: 08/10/2023 CLINICAL DATA:  Generalized abdominal pain. EXAM: CT ABDOMEN AND PELVIS WITHOUT CONTRAST TECHNIQUE: Multidetector CT imaging of the abdomen and pelvis was performed following the standard protocol without IV contrast. RADIATION DOSE REDUCTION: This exam was performed according to the departmental dose-optimization program which includes automated exposure control, adjustment of the mA and/or kV according to patient size and/or use of iterative reconstruction technique. COMPARISON:  04/15/2023 FINDINGS: Lower chest: No substantial change since chest CTA yesterday. Masslike opacity in the right lower lung is similar with advanced changes of emphysema noted in the lung  bases bilaterally. Hepatobiliary: 10 mm hypodensity in the inferior right liver is similar to prior study. Cholecystectomy. No intrahepatic or extrahepatic biliary dilation. Pancreas: No focal mass lesion. No dilatation of the main duct. No intraparenchymal cyst. No peripancreatic edema. Spleen: No splenomegaly. No suspicious focal mass lesion. Adrenals/Urinary Tract: No adrenal nodule or mass. Right kidney unremarkable. Stable small left renal cysts. Contrast excretion from the kidneys compatible with recent chest CTA in there may be a caliceal diverticulum in the lower pole left kidney. No evidence for hydroureter. The urinary bladder appears normal for the degree of distention. Stomach/Bowel: Stomach is distended with gas. Duodenum is normally positioned as is the ligament of Treitz. No small bowel wall thickening. No small bowel dilatation. The terminal ileum is normal. The appendix is normal. No gross colonic mass. No colonic wall thickening. Vascular/Lymphatic: There is advanced atherosclerotic calcification of the abdominal aorta without aneurysm. There is no gastrohepatic or hepatoduodenal ligament lymphadenopathy. No retroperitoneal or mesenteric lymphadenopathy. No pelvic sidewall lymphadenopathy. Reproductive: Prostate gland appears mildly enlarged. Other: No intraperitoneal free fluid. Musculoskeletal: No worrisome lytic or sclerotic osseous abnormality. IMPRESSION: 1. No acute findings in the abdomen or pelvis. Specifically, no findings to explain the patient's history of abdominal pain. 2. 10 mm low-density lesion inferior right liver incompletely characterized on today's noncontrast CT. Previous CT recommended MRI follow-up. 3. Advanced emphysema in the lung bases with consolidative masslike opacity at the right base. See report for chest CTA yesterday. 4. Aortic Atherosclerosis (ICD10-I70.0) and Emphysema (ICD10-J43.9). Electronically Signed   By: Donnal Fusi M.D.   On: 08/10/2023 07:43   DG Abd  1 View Result Date: 08/10/2023 CLINICAL DATA:  Abdominal distension EXAM: ABDOMEN - 1 VIEW COMPARISON:  CT abdomen pelvis 04/15/2023 FINDINGS: Gaseous dilation of small bowel in the central abdomen. This may be due to ileus or obstruction. Cholecystectomy. Contrast in the bladder. No acute osseous abnormality. IMPRESSION: Gaseous dilation of small bowel in the central abdomen may be due to ileus or obstruction. Consider CT for further evaluation if there is concern for obstruction. Electronically Signed   By: Rozell Cornet M.D.   On: 08/10/2023 01:26   CT  Angio Chest PE W and/or Wo Contrast Result Date: 08/09/2023 CLINICAL DATA:  High probability pulmonary embolism. Shortness of breath. History of lung cancer, hypoxia, tachycardia. EXAM: CT ANGIOGRAPHY CHEST WITH CONTRAST TECHNIQUE: Multidetector CT imaging of the chest was performed using the standard protocol during bolus administration of intravenous contrast. Multiplanar CT image reconstructions and MIPs were obtained to evaluate the vascular anatomy. RADIATION DOSE REDUCTION: This exam was performed according to the departmental dose-optimization program which includes automated exposure control, adjustment of the mA and/or kV according to patient size and/or use of iterative reconstruction technique. CONTRAST:  75mL OMNIPAQUE  IOHEXOL  350 MG/ML SOLN COMPARISON:  Chest radiograph 08/09/2023 and CT chest 05/19/2023 FINDINGS: Cardiovascular: Normal heart size. No pericardial effusion. Negative for acute pulmonary embolism. Normal caliber thoracic aorta. No evidence of aortic dissection. Coronary artery and aortic atherosclerotic calcification. Mediastinum/Nodes: Debris in the posterior trachea. Unremarkable esophagus. Unchanged 1.1 cm right hilar node on series 7/image 193 and 2.5 cm subcarinal confluent node on 7/209. Lungs/Pleura: Advanced emphysema with bullous change. Upper Abdomen: No acute abnormality. Partially calcified superior segment right lower  lobe nodule is unchanged measuring 2.0 by 1.3 cm (series 6/image 93). Similar right lower lobe posterior basal mass measuring 4.9 x 7.0 cm (series 6/image 102). Similar bronchial wall thickening and mucous plugging and the bilateral lower lobes and right middle lobe bronchi. Probable small right pleural effusion. No pneumothorax. Musculoskeletal: Since 05/19/2023 there is a new compression fracture of T7 with 50% vertebral body height loss centrally. No retropulsion. No evidence of underlying metastatic lesion. Review of the MIP images confirms the above findings. IMPRESSION: 1. Negative for acute pulmonary embolism. 2. Similar right lower lobe posterior basal mass, right hilar and mediastinal lymphadenopathy compared to 05/19/2023. 3. Similar bronchial wall thickening and mucous plugging in the bilateral lower lobes and right middle lobe bronchi. 4. Small right pleural effusion. 5. Since 05/19/2023 there is a new compression fracture of T7 with 50% vertebral body height loss centrally. No retropulsion. No evidence of underlying metastatic lesion. Aortic Atherosclerosis (ICD10-I70.0) and Emphysema (ICD10-J43.9). Electronically Signed   By: Rozell Cornet M.D.   On: 08/09/2023 22:28   DG Chest Portable 1 View Result Date: 08/09/2023 CLINICAL DATA:  Shortness of breath. EXAM: PORTABLE CHEST 1 VIEW COMPARISON:  October 09, 2022 FINDINGS: The heart size and mediastinal contours are within normal limits. There is evidence of extensive emphysematous lung disease. A mild component of superimposed atelectasis and/or infiltrate is noted within the mid left lung. There is moderate severity right basilar scarring, atelectasis and/or infiltrate. A component of the patient's known right basilar lung mass is also noted. Mild left basilar linear scarring and/or atelectasis is seen. There is a small right pleural effusion. No pneumothorax is identified. Radiopaque surgical clips are seen within the right upper quadrant. The  visualized skeletal structures are unremarkable. IMPRESSION: 1. Extensive emphysematous lung disease with a mild component of superimposed atelectasis and/or infiltrate within the mid left lung. 2. Moderate severity right basilar scarring, atelectasis and/or infiltrate with a component of the patient's known right basilar lung mass. 3. Small right pleural effusion. Electronically Signed   By: Virgle Grime M.D.   On: 08/09/2023 20:50   Scheduled Meds:  amLODipine   5 mg Oral Daily   atorvastatin   10 mg Oral QHS   benztropine   2 mg Oral BID   budesonide  (PULMICORT ) nebulizer solution  0.25 mg Nebulization BID   carvedilol   3.125 mg Oral BID WC   famotidine   40 mg Oral BID  insulin aspart  0-5 Units Subcutaneous QHS   insulin aspart  0-9 Units Subcutaneous TID WC   ipratropium-albuterol   3 mL Nebulization Q6H   lidocaine   1 patch Transdermal Q24H   methylPREDNISolone  (SOLU-MEDROL ) injection  40 mg Intravenous Q12H   mirtazapine   30 mg Oral QHS   risperiDONE   3 mg Oral BID   sodium chloride  HYPERTONIC  4 mL Nebulization BID   tamsulosin   0.4 mg Oral BID   Continuous Infusions:  azithromycin      cefTRIAXone  (ROCEPHIN )  IV       LOS: 0 days    Time spent: 50 mins    Magdalene School, MD [facility]   If 7PM-7AM, please contact night-coverage

## 2023-08-10 NOTE — ED Notes (Addendum)
 OT at bedside.

## 2023-08-10 NOTE — Evaluation (Signed)
 Occupational Therapy Evaluation Patient Details Name: Blake Mcknight MRN: [id number] DOB: 04/03/1955 Today's Date: 08/10/2023   History of Present Illness   Pt is a 68 y.o. male who presented 08/09/23 with SOB, hypoxia to 90% with EMS. Has had cough, rhinorrhea, and chills. Fluctuating compliance with home O2. PMH: COPD, HLD, HTN, GERD, BPH, DM2,  schizophrenia, lung cancer     Clinical Impressions Pt mobilizing with supervision for O2 line/tank on 2L. Reports he is on 2L at home and his sister and "friend" help with bathing, dressing and IADLs. Pt likely very near his baseline. No further OT needs.  No post acute OT anticipated.       If plan is discharge home, recommend the following:   Assist for transportation     Functional Status Assessment   Patient has not had a recent decline in their functional status     Equipment Recommendations   None recommended by OT     Recommendations for Other Services         Precautions/Restrictions   Precautions Precautions: None Precaution/Restrictions Comments: watch O2 sats, T7 compression fx Restrictions Weight Bearing Restrictions Per Provider Order: No     Mobility Bed Mobility Overal bed mobility: Independent               Patient Response: Cooperative  Transfers Overall transfer level: Modified independent Equipment used: None                      Balance Overall balance assessment: Mild deficits observed, not formally tested                                         ADL either performed or assessed with clinical judgement   ADL Overall ADL's : At baseline                                             Vision Patient Visual Report: No change from baseline       Perception Perception: Not tested       Praxis Praxis: Not tested       Pertinent Vitals/Pain Pain Assessment Pain Assessment: Faces Faces Pain Scale: Hurts a little bit Pain  Location: back Pain Descriptors / Indicators: Aching Pain Intervention(s): Monitored during session     Extremity/Trunk Assessment Upper Extremity Assessment Upper Extremity Assessment: Overall WFL for tasks assessed   Lower Extremity Assessment Lower Extremity Assessment: Defer to PT evaluation   Cervical / Trunk Assessment Cervical / Trunk Assessment: Normal   Communication Communication Communication: No apparent difficulties   Cognition Arousal: Alert Behavior During Therapy: Flat affect Cognition: History of cognitive impairments                               Following commands: Intact       Cueing  General Comments   Cueing Techniques: Verbal cues  discussed used of rollator for energy conservation and encouraged pt to try to be more active at home as he reports staying in the bed all day   Exercises     Shoulder Instructions      Home Living Family/patient expects to be discharged to:: Private residence Living Arrangements: Other relatives Available  Help at Discharge: Family;Available 24 hours/day Type of Home: House Home Access: Stairs to enter [employer] of Steps: 2 Entrance Stairs-Rails: None Home Layout: One level     Bathroom Shower/Tub: Chief Strategy Officer: Standard Bathroom Accessibility: Yes   Home Equipment: Wheelchair - Manufacturing systems engineer   Additional Comments: lives with sister. Rarely home alone      Prior Functioning/Environment Prior Level of Function : Needs assist;Patient poor historian/Family not available             Mobility Comments: not using assistive device ADLs Comments: Pt reports he is fairly sedentary at baseline, family assists with ADL and iADL.    OT Problem List: Pain   OT Treatment/Interventions:        OT Goals(Current goals can be found in the care plan section)   Acute Rehab OT Goals OT Goal Formulation: Patient unable to participate in goal setting Time  For Goal Achievement: 08/14/23 Potential to Achieve Goals: Good   OT Frequency:       Co-evaluation              AM-PAC OT "6 Clicks" Daily Activity     Outcome Measure Help from another person eating meals?: None Help from another person taking care of personal grooming?: None Help from another person toileting, which includes using toliet, bedpan, or urinal?: None Help from another person bathing (including washing, rinsing, drying)?: None Help from another person to put on and taking off regular upper body clothing?: None Help from another person to put on and taking off regular lower body clothing?: A Little 6 Click Score: 23   End of Session Equipment Utilized During Treatment: Oxygen  Nurse Communication: Mobility status  Activity Tolerance: Patient tolerated treatment well Patient left: in bed;with call bell/phone within reach  OT Visit Diagnosis: Pain Pain - part of body:  (back)                Time: 1610-9604 OT Time Calculation (min): 18 min Charges:  OT General Charges $OT Visit: 1 Visit OT Evaluation $OT Eval Moderate Complexity: 1 Mod  08/10/2023  RP, OTR/L  Acute Rehabilitation Services  Office:  [phone]   Benjamen Brand 08/10/2023, 10:52 AM

## 2023-08-10 NOTE — ED Notes (Signed)
 Pt to CT via stretcher. RR even. NADN.

## 2023-08-10 NOTE — Progress Notes (Signed)
 Transition of Care [facility]) - Inpatient Brief Assessment   Patient Details  Name: EBON KETCHUM MRN: [id number] Date of Birth: 1956-02-02  Transition of Care [facility]) CM/SW Contact:    Joanette Moynahan, RN Phone Number: 08/10/2023, 10:22 AM   Clinical Narrative: RNCM met with pt at bedside regarding discharge planning.  Pt states he was having difficulty breathing and back pain, so he came to ED.  Pt lives at home alone and relies on his daughter for transportation.   Pt plans to return home with home health vs self care.   Transition of Care Asessment: Insurance and Status: (P) Insurance coverage has been reviewed Patient has primary care physician: (P) Yes Home environment has been reviewed: (P) from home alone Prior level of function:: (P) independent Prior/Current Home Services: (P) No current home services Social Drivers of Health Review: (P) SDOH reviewed no interventions necessary Readmission risk has been reviewed: (P) Yes Transition of care needs: (P) transition of care needs identified, TOC will continue to follow

## 2023-08-10 NOTE — Evaluation (Signed)
 Physical Therapy Evaluation Patient Details Name: Blake Mcknight MRN: [id number] DOB: April 13, 1955 Today's Date: 08/10/2023  History of Present Illness  Pt is a 68 y.o. male who presented 08/09/23 with SOB, hypoxia to 90% with EMS. Has had cough, rhinorrhea, and chills. Fluctuating compliance with home O2. PMH: COPD, HLD, HTN, GERD, BPH, DM2,  schizophrenia, lung cancer  Clinical Impression  Pt admitted with above diagnosis. Pt from home with sister, he reports that he has back pain and abdominal pain every day which keeps him from mobilizing and that he stays in bed most of the day. Pt mobilized in room on and off O2. SPO2 remained 97-100% with and without O2. HR 109 bpm with ambulation on RA, a few beats lower with O2 donned. Subjectively pt reports he feels better with O2 on and noted to have audible exhale when breathing RA. Will follow acutely but do not anticipate him having PT needs at d/c. Encouraged him to try to increased activity level at home. Would benefit from rollator for energy conservation and would hopefully help his pain to have hands on a device. Will try with him next visit. Pt currently with functional limitations due to the deficits listed below (see PT Problem List). Pt will benefit from acute skilled PT to increase their independence and safety with mobility to allow discharge.           If plan is discharge home, recommend the following: Help with stairs or ramp for entrance;Assistance with cooking/housework   Can travel by private Data processing manager (4 wheels)  Recommendations for Other Services       Functional Status Assessment Patient has not had a recent decline in their functional status     Precautions / Restrictions Precautions Precautions: Other (comment) Recall of Precautions/Restrictions: Impaired Precaution/Restrictions Comments: watch O2 sats Restrictions Weight Bearing Restrictions Per Provider Order: No       Mobility  Bed Mobility Overal bed mobility: Independent             General bed mobility comments: pt hops right off bed and scoots self right back onto stretcher without difficulty. Increased WOB noted with all activity but SPO2 remained in upper 90's throughout, with and without O2    Transfers Overall transfer level: Modified independent Equipment used: None               General transfer comment: pt stable with sit>stand. Performed with and without O2, SPO2 remained 97% and above in both instances but without O2 pt's breathing changes with audible exhale. HR 109 bpm without O2, remained a few beats lower with O2 donned    Ambulation/Gait Ambulation/Gait assistance: Supervision Gait Distance (Feet): 25 Feet Assistive device: None Gait Pattern/deviations: WFL(Within Functional Limits) Gait velocity: decreased Gait velocity interpretation: 1.31 - 2.62 ft/sec, indicative of limited community ambulator   General Gait Details: pt steady with gait, ambulated in room without O2 as cord would not reach. Pt subjectively felt worse without O2 but sats remained 97%  Stairs            Wheelchair Mobility     Tilt Bed    Modified Rankin (Stroke Patients Only)       Balance Overall balance assessment: Mild deficits observed, not formally tested  Pertinent Vitals/Pain Pain Assessment Pain Assessment: Faces Faces Pain Scale: Hurts little more Pain Location: back and abdomen Pain Descriptors / Indicators: Constant Pain Intervention(s): Patient requesting pain meds-RN notified, Monitored during session    Home Living Family/patient expects to be discharged to:: Private residence Living Arrangements: Other relatives (sister) Available Help at Discharge: Family;Available 24 hours/day Type of Home: House Home Access: Stairs to enter Entrance Stairs-Rails: None Entrance Stairs-Number of Steps: 2    Home Layout: One level Home Equipment: Wheelchair - Lawyer Comments: lives with sister. Rarely home alone    Prior Function Prior Level of Function : Needs assist;Patient poor historian/Family not available             Mobility Comments: not using assistive device ADLs Comments: reports "they" help him with bathing, dressing, meal prep and housekeeping. Pt reports he stays in the bed most of the day     Extremity/Trunk Assessment   Upper Extremity Assessment Upper Extremity Assessment: Defer to OT evaluation    Lower Extremity Assessment Lower Extremity Assessment: Overall WFL for tasks assessed    Cervical / Trunk Assessment Cervical / Trunk Assessment: Normal  Communication   Communication Communication: No apparent difficulties    Cognition Arousal: Alert Behavior During Therapy: Flat affect   PT - Cognitive impairments: No family/caregiver present to determine baseline                       PT - Cognition Comments: pt can answer questions appropriately but seems to have low health literacy and minimal motivation to care for self. Does not engage in more complex conversations. Following commands: Intact       Cueing Cueing Techniques: Verbal cues     General Comments General comments (skin integrity, edema, etc.): discussed used of rollator for energy conservation and encouraged pt to try to be more active at home as he reports staying in the bed all day    Exercises     Assessment/Plan    PT Assessment Patient needs continued PT services  PT Problem List Cardiopulmonary status limiting activity;Decreased mobility       PT Treatment Interventions DME instruction;Gait training;Stair training;Functional mobility training;Therapeutic activities;Therapeutic exercise;Patient/family education    PT Goals (Current goals can be found in the Care Plan section)  Acute Rehab PT Goals Patient Stated Goal: get pain medicine for  abdominal pain PT Goal Formulation: With patient Time For Goal Achievement: 08/24/23 Potential to Achieve Goals: Fair    Frequency Min 2X/week     Co-evaluation               AM-PAC PT "6 Clicks" Mobility  Outcome Measure Help needed turning from your back to your side while in a flat bed without using bedrails?: None Help needed moving from lying on your back to sitting on the side of a flat bed without using bedrails?: None Help needed moving to and from a bed to a chair (including a wheelchair)?: None Help needed standing up from a chair using your arms (e.g., wheelchair or bedside chair)?: A Little Help needed to walk in hospital room?: A Little Help needed climbing 3-5 steps with a railing? : A Little 6 Click Score: 21    End of Session Equipment Utilized During Treatment: Oxygen  Activity Tolerance: Patient tolerated treatment well Patient left: in bed;with call bell/phone within reach Nurse Communication: Mobility status PT Visit Diagnosis: Difficulty in walking, not elsewhere classified (R26.2)    Time: 1610-9604 PT Time  Calculation (min) (ACUTE ONLY): 29 min   Charges:   PT Evaluation $PT Eval Moderate Complexity: 1 Mod PT Treatments $Gait Training: 8-22 mins PT General Charges $$ ACUTE PT VISIT: 1 Visit         Amey Ka, PT  Acute Rehab Services Secure chat preferred Office [phone]   Deloris Fetters Telesa Jeancharles 08/10/2023, 10:35 AM

## 2023-08-10 NOTE — Care Management Obs Status (Signed)
 MEDICARE OBSERVATION STATUS NOTIFICATION   Patient Details  Name: Blake Mcknight MRN: [id number] Date of Birth: Sep 11, 1955   Medicare Observation Status Notification Given:  Yes    Joanette Moynahan, RN 08/10/2023, 9:57 AM

## 2023-08-11 ENCOUNTER — Inpatient Hospital Stay (HOSPITAL_COMMUNITY)

## 2023-08-11 DIAGNOSIS — J441 Chronic obstructive pulmonary disease with (acute) exacerbation: Secondary | ICD-10-CM | POA: Diagnosis not present

## 2023-08-11 LAB — LACTIC ACID, PLASMA: Lactic Acid, Venous: 4.2 mmol/L (ref 0.5–1.9)

## 2023-08-11 LAB — GLUCOSE, CAPILLARY
Glucose-Capillary: 168 mg/dL — ABNORMAL HIGH (ref 70–99)
Glucose-Capillary: 102 mg/dL — ABNORMAL HIGH (ref 70–99)
Glucose-Capillary: 120 mg/dL — ABNORMAL HIGH (ref 70–99)

## 2023-08-11 MED ORDER — AZITHROMYCIN 500 MG PO TABS
500.0000 mg | ORAL_TABLET | Freq: Every day | ORAL | Status: AC
  Administered 2023-08-11 – 2023-08-13 (×3): 500 mg via ORAL
  Filled 2023-08-11: qty 2
  Filled 2023-08-11: qty 1
  Filled 2023-08-11: qty 2
  Filled 2023-08-11: qty 1
  Filled 2023-08-11: qty 2

## 2023-08-11 MED ORDER — ORAL CARE MOUTH RINSE: 15.0000 mL | OROMUCOSAL | Status: DC | PRN

## 2023-08-11 MED ORDER — SODIUM CHLORIDE 0.9 % IV BOLUS
500.0000 mL | Freq: Once | INTRAVENOUS | Status: AC
  Administered 2023-08-11: 500 mL via INTRAVENOUS

## 2023-08-11 NOTE — Plan of Care (Signed)
  Problem: Education: Goal: Knowledge of General Education information will improve Description: Including pain rating scale, medication(s)/side effects and non-pharmacologic comfort measures Outcome: Progressing   Problem: Health Behavior/Discharge Planning: Goal: Ability to manage health-related needs will improve Outcome: Progressing   Problem: Clinical Measurements: Goal: Ability to maintain clinical measurements within normal limits will improve Outcome: Progressing Goal: Respiratory complications will improve Outcome: Progressing   Problem: Activity: Goal: Risk for activity intolerance will decrease Outcome: Progressing   Problem: Coping: Goal: Level of anxiety will decrease Outcome: Progressing   Problem: Activity: Goal: Ability to tolerate increased activity will improve Outcome: Progressing Goal: Will verbalize the importance of balancing activity with adequate rest periods Outcome: Progressing

## 2023-08-11 NOTE — Progress Notes (Incomplete)
 Family at bedside, requesting scheduled breathing treatment at 2000. Discussed with RT. Pt in no distress. Treatments administered.

## 2023-08-11 NOTE — Progress Notes (Signed)
 PROGRESS NOTE    Blake Mcknight  ZOX:[id number] DOB: [birth year] DOA: 08/09/2023 PCP: Jacqueline Matsu, MD   Brief Narrative:  This 68 yrs old male with PMH significant for COPD, chronic hypoxemic respiratory failure on 2 L home oxygen , malignant neoplasm of right lung status post SBRT, hypertension, hyperlipidemia, schizophrenia, GERD, BPH, type 2 diabetes presenting with a chief complaint of shortness of breath.  Patient has progressive worsening of shortness of breath, wheezing and productive cough of yellow sputum for last 1 week.  Patient has been using nebulizer 4 times daily at home without such improvement.  She also developed substernal chest pain for last few days.  Sister states chest pain and back pain could be from heavy lifting as he done recently.  In the ED he was found to have significant wheezing, he was given IV Solu-Medrol ,  magnesium  sulfate and nebulized bronchodilators.  CTA chest negative for PE.  It also shows bronchial wall thickening and mucous plugging in bilateral lower lobes and small right pleural effusion.  There is a new compression fracture of T7 with 50% vertebral body height loss.  No evidence of underlying metastatic lesion.  Patient was admitted for COPD exacerbation.  Assessment & Plan:   Principal Problem:   COPD exacerbation (HCC) Active Problems:   Abdominal pain   Schizophrenia (HCC)   Chronic hypoxemic respiratory failure (HCC)   Chest pain  COPD with acute exacerbation: Suspected sepsis: Patient presented with progressive shortness of breath,  wheezing, and cough productive of yellow sputum.   WBC count 15.0, lactate 2.8.  No hypotension.   COVID / Influenza / RSV PCR negative.  CTA chest negative for PE. It showed stable right lower lobe posterior basal mass, right hilar and mediastinal lymphadenopathy since previous CT from 05/19/2023.  Also showing similar bronchial wall thickening and mucous plugging in the bilateral lower lobes and right  middle lobe bronchi and small right pleural effusion.   Continue empiric antibiotics (ceftriaxone  and Zithromax ).   Trend WBC count and lactate.  Follow-up blood cultures.  Continue IV Solu-Medrol  40 mg every 12 hours,  Continue DuoNeb every 6 hours, Pulmicort  neb twice daily,  Continue albuterol  neb every 2 hours PRN, and hypertonic saline nebs.  Pulmonary hygiene.   Chronic hypoxic respiratory failure: No change in oxygen  requirement from baseline.   Continuous pulse ox, continue supplemental oxygen .   Chest pain, Atypical: Appears atypical.  Workup not suggestive of ACS or PE.   Patient appears comfortable on exam.   Generalized abdominal pain: Patient reports generalized abdominal pain and family reporting constipation.   Unclear when he last had a bowel movement. No nausea or vomiting reported.   Does have abdominal distention on exam.   X-ray KUB shows gaseous dilatation of small bowel,  could be ileus or obstruction. Patient reports abdominal pain has improved. Tolerating Clear liquid diet Obtain xray Abdomen to check resolution.  Malignant neoplasm of lower lobe of right lung status post SBRT: CT showing stable findings.  Outpatient follow-up with pulmonology/radiation oncology.   Vertebral compression fracture: Back pain: CT showing a new compression fracture of T7 with 50% vertebral body height loss centrally compared to previous CT from 05/19/2023.  No retropulsion or evidence of underlying metastatic lesion seen.  No falls reported although per family. Patient did lift a heavy object/twin bed at home.  Continue pain management, PT/OT, fall precautions.   Hypertension: Currently normotensive.   Continue amlodipine  and Coreg .   Hyperlipidemia: Continue Lipitor.   Schizophrenia: Continue home  medications.   GERD: Continue PPI.   BPH: Continue Flomax .   Type 2 diabetes: Patient is on Januvia at home, last A1c unknown.   Hold oral hypoglycemic agent at this time  and check A1c.   Placed on sensitive sliding scale insulin  ACHS.    DVT prophylaxis: SCDs Code Status: DNR Family Communication: No family at bed side Disposition Plan:    Status is: Observation The patient remains OBS appropriate and will d/c before 2 midnights.  Admitted for COPD exacerbation.   Consultants:  None  Procedures: CTA chest.  Antimicrobials: Anti-infectives (From admission, onward)    Start     Dose/Rate Route Frequency Ordered Stop   08/11/23 1400  azithromycin  (ZITHROMAX ) tablet 500 mg        500 mg Oral Daily 08/11/23 1103     08/10/23 2200  cefTRIAXone  (ROCEPHIN ) 1 g in sodium chloride  0.9 % 100 mL IVPB        1 g 200 mL/hr over 30 Minutes Intravenous Every 24 hours 08/10/23 0106     08/10/23 2200  azithromycin  (ZITHROMAX ) 500 mg in sodium chloride  0.9 % 250 mL IVPB  Status:  Discontinued        500 mg 250 mL/hr over 60 Minutes Intravenous Every 24 hours 08/10/23 0106 08/11/23 1103   08/09/23 2130  cefTRIAXone  (ROCEPHIN ) 1 g in sodium chloride  0.9 % 100 mL IVPB        1 g 200 mL/hr over 30 Minutes Intravenous  Once 08/09/23 2122 08/09/23 2247   08/09/23 2130  azithromycin  (ZITHROMAX ) 500 mg in sodium chloride  0.9 % 250 mL IVPB        500 mg 250 mL/hr over 60 Minutes Intravenous  Once 08/09/23 2122 08/10/23 0050      Subjective: Patient was seen and examined at bedside.Overnight events noted. Patient remains on baseline oxygen  requirement, He reports feeling better,  denies any abdominal pain, tolerating clear liquid diet.  Objective: Vitals:   08/11/23 0729 08/11/23 0730 08/11/23 0737 08/11/23 1213  BP:   (!) 109/53 (!) 142/80  Pulse:      Resp:   20   Temp:   97.9 F (36.6 C) 98.5 F (36.9 C)  TempSrc:   Oral Oral  SpO2: 99% 99%    Weight:      Height:        Intake/Output Summary (Last 24 hours) at 08/11/2023 1350 Last data filed at 08/11/2023 1213 Gross per 24 hour  Intake 994.11 ml  Output 900 ml  Net 94.11 ml   Filed Weights    08/09/23 2009  Weight: 74.8 kg    Examination:  General exam: Appears calm and comfortable, not in any acute distress. Respiratory system: CTA Bilaterally. Respiratory effort normal.  RR 15 Cardiovascular system: S1 & S2 heard, RRR. No JVD, murmurs, rubs, gallops or clicks.  Gastrointestinal system: Abdomen is Mildly distended, soft and  Mildly tender. Normal bowel sounds heard. Central nervous system: Alert and oriented x 3. No focal neurological deficits. Extremities: No edema, no cyanosis, no clubbing. Skin: No rashes, lesions or ulcers Psychiatry: Judgement and insight appear normal. Mood & affect appropriate.     Data Reviewed: I have personally reviewed following labs and imaging studies  CBC: Recent Labs  Lab 08/09/23 2047 08/10/23 0229  WBC 15.0* 12.7*  NEUTROABS 12.6*  --   HGB 12.8* 12.2*  HCT 39.8 38.3*  MCV 83.6 83.6  PLT 306 304   Basic Metabolic Panel: Recent Labs  Lab 08/09/23 2047  NA 132*  K 4.0  CL 93*  CO2 25  GLUCOSE 155*  BUN 9  CREATININE 0.61  CALCIUM  10.2   GFR: Estimated Creatinine Clearance: 83.1 mL/min (by C-G formula based on SCr of 0.61 mg/dL). Liver Function Tests: Recent Labs  Lab 08/10/23 0229  AST 25  ALT 22  ALKPHOS 54  BILITOT 0.5  PROT 7.0  ALBUMIN 2.8*   Recent Labs  Lab 08/10/23 0229  LIPASE 27   No results for input(s): "AMMONIA" in the last 168 hours. Coagulation Profile: No results for input(s): "INR", "PROTIME" in the last 168 hours. Cardiac Enzymes: No results for input(s): "CKTOTAL", "CKMB", "CKMBINDEX", "TROPONINI" in the last 168 hours. BNP (last 3 results) No results for input(s): "PROBNP" in the last 8760 hours. HbA1C: Recent Labs    08/10/23 0229  HGBA1C 5.9*   CBG: Recent Labs  Lab 08/10/23 1232 08/10/23 1740 08/10/23 2131 08/11/23 0736 08/11/23 1211  GLUCAP 182* 171* 142* 168* 102*   Lipid Profile: No results for input(s): "CHOL", "HDL", "LDLCALC", "TRIG", "CHOLHDL", "LDLDIRECT" in  the last 72 hours. Thyroid Function Tests: No results for input(s): "TSH", "T4TOTAL", "FREET4", "T3FREE", "THYROIDAB" in the last 72 hours. Anemia Panel: No results for input(s): "VITAMINB12", "FOLATE", "FERRITIN", "TIBC", "IRON", "RETICCTPCT" in the last 72 hours. Sepsis Labs: Recent Labs  Lab 08/09/23 2136 08/09/23 2359 08/10/23 0229  PROCALCITON  --   --  <0.10  LATICACIDVEN 2.8* 3.2*  --     Recent Results (from the past 240 hours)  Blood culture (routine x 2)     Status: None (Preliminary result)   Collection Time: 08/09/23  8:47 PM   Specimen: BLOOD  Result Value Ref Range Status   Specimen Description BLOOD SITE NOT SPECIFIED  Final   Special Requests   Final    BOTTLES DRAWN AEROBIC AND ANAEROBIC Blood Culture adequate volume   Culture   Final    NO GROWTH 2 DAYS Performed at [facility], [address], [place], [place] [address]    Report Status PENDING  Incomplete  Resp panel by RT-PCR (RSV, Flu A&B, Covid) Peripheral     Status: None   Collection Time: 08/09/23  8:47 PM   Specimen: Peripheral; Nasal Swab  Result Value Ref Range Status   SARS Coronavirus 2 by RT PCR NEGATIVE NEGATIVE Final   Influenza A by PCR NEGATIVE NEGATIVE Final   Influenza B by PCR NEGATIVE NEGATIVE Final    Comment: (NOTE) The Xpert Xpress SARS-CoV-2/FLU/RSV plus assay is intended as an aid in the diagnosis of influenza from Nasopharyngeal swab specimens and should not be used as a sole basis for treatment. Nasal washings and aspirates are unacceptable for Xpert Xpress SARS-CoV-2/FLU/RSV testing.  Fact Sheet for Patients: BloggerCourse.com  Fact Sheet for Healthcare Providers: SeriousBroker.it  This test is not yet approved or cleared by the [place]  FDA and has been authorized for detection and/or diagnosis of SARS-CoV-2 by FDA under an Emergency Use Authorization (EUA). This EUA will remain in effect (meaning this test  can be used) for the duration of the COVID-19 declaration under Section 564(b)(1) of the Act, 21 U.S.C. section 360bbb-3(b)(1), unless the authorization is terminated or revoked.     Resp Syncytial Virus by PCR NEGATIVE NEGATIVE Final    Comment: (NOTE) Fact Sheet for Patients: BloggerCourse.com  Fact Sheet for Healthcare Providers: SeriousBroker.it  This test is not yet approved or cleared by the [place]  FDA and has been authorized for detection and/or diagnosis of  SARS-CoV-2 by FDA under an Emergency Use Authorization (EUA). This EUA will remain in effect (meaning this test can be used) for the duration of the COVID-19 declaration under Section 564(b)(1) of the Act, 21 U.S.C. section 360bbb-3(b)(1), unless the authorization is terminated or revoked.  Performed at [facility], [address] [place], [place] [address]   Blood culture (routine x 2)     Status: None (Preliminary result)   Collection Time: 08/09/23  8:49 PM   Specimen: BLOOD LEFT ARM  Result Value Ref Range Status   Specimen Description BLOOD LEFT ARM  Final   Special Requests   Final    BOTTLES DRAWN AEROBIC AND ANAEROBIC Blood Culture results may not be optimal due to an inadequate volume of blood received in culture bottles   Culture   Final    NO GROWTH 2 DAYS Performed at [facility], [address] [place], [place] [address]    Report Status PENDING  Incomplete    Radiology Studies: CT ABDOMEN PELVIS WO CONTRAST Result Date: 08/10/2023 CLINICAL DATA:  Generalized abdominal pain. EXAM: CT ABDOMEN AND PELVIS WITHOUT CONTRAST TECHNIQUE: Multidetector CT imaging of the abdomen and pelvis was performed following the standard protocol without IV contrast. RADIATION DOSE REDUCTION: This exam was performed according to the departmental dose-optimization program which includes automated exposure control, adjustment of the mA and/or kV according  to patient size and/or use of iterative reconstruction technique. COMPARISON:  04/15/2023 FINDINGS: Lower chest: No substantial change since chest CTA yesterday. Masslike opacity in the right lower lung is similar with advanced changes of emphysema noted in the lung bases bilaterally. Hepatobiliary: 10 mm hypodensity in the inferior right liver is similar to prior study. Cholecystectomy. No intrahepatic or extrahepatic biliary dilation. Pancreas: No focal mass lesion. No dilatation of the main duct. No intraparenchymal cyst. No peripancreatic edema. Spleen: No splenomegaly. No suspicious focal mass lesion. Adrenals/Urinary Tract: No adrenal nodule or mass. Right kidney unremarkable. Stable small left renal cysts. Contrast excretion from the kidneys compatible with recent chest CTA in there may be a caliceal diverticulum in the lower pole left kidney. No evidence for hydroureter. The urinary bladder appears normal for the degree of distention. Stomach/Bowel: Stomach is distended with gas. Duodenum is normally positioned as is the ligament of Treitz. No small bowel wall thickening. No small bowel dilatation. The terminal ileum is normal. The appendix is normal. No gross colonic mass. No colonic wall thickening. Vascular/Lymphatic: There is advanced atherosclerotic calcification of the abdominal aorta without aneurysm. There is no gastrohepatic or hepatoduodenal ligament lymphadenopathy. No retroperitoneal or mesenteric lymphadenopathy. No pelvic sidewall lymphadenopathy. Reproductive: Prostate gland appears mildly enlarged. Other: No intraperitoneal free fluid. Musculoskeletal: No worrisome lytic or sclerotic osseous abnormality. IMPRESSION: 1. No acute findings in the abdomen or pelvis. Specifically, no findings to explain the patient's history of abdominal pain. 2. 10 mm low-density lesion inferior right liver incompletely characterized on today's noncontrast CT. Previous CT recommended MRI follow-up. 3. Advanced  emphysema in the lung bases with consolidative masslike opacity at the right base. See report for chest CTA yesterday. 4. Aortic Atherosclerosis (ICD10-I70.0) and Emphysema (ICD10-J43.9). Electronically Signed   By: Donnal Fusi M.D.   On: 08/10/2023 07:43   DG Abd 1 View Result Date: 08/10/2023 CLINICAL DATA:  Abdominal distension EXAM: ABDOMEN - 1 VIEW COMPARISON:  CT abdomen pelvis 04/15/2023 FINDINGS: Gaseous dilation of small bowel in the central abdomen. This may be due to ileus or obstruction. Cholecystectomy. Contrast in the  bladder. No acute osseous abnormality. IMPRESSION: Gaseous dilation of small bowel in the central abdomen may be due to ileus or obstruction. Consider CT for further evaluation if there is concern for obstruction. Electronically Signed   By: Rozell Cornet M.D.   On: 08/10/2023 01:26   CT Angio Chest PE W and/or Wo Contrast Result Date: 08/09/2023 CLINICAL DATA:  High probability pulmonary embolism. Shortness of breath. History of lung cancer, hypoxia, tachycardia. EXAM: CT ANGIOGRAPHY CHEST WITH CONTRAST TECHNIQUE: Multidetector CT imaging of the chest was performed using the standard protocol during bolus administration of intravenous contrast. Multiplanar CT image reconstructions and MIPs were obtained to evaluate the vascular anatomy. RADIATION DOSE REDUCTION: This exam was performed according to the departmental dose-optimization program which includes automated exposure control, adjustment of the mA and/or kV according to patient size and/or use of iterative reconstruction technique. CONTRAST:  75mL OMNIPAQUE  IOHEXOL  350 MG/ML SOLN COMPARISON:  Chest radiograph 08/09/2023 and CT chest 05/19/2023 FINDINGS: Cardiovascular: Normal heart size. No pericardial effusion. Negative for acute pulmonary embolism. Normal caliber thoracic aorta. No evidence of aortic dissection. Coronary artery and aortic atherosclerotic calcification. Mediastinum/Nodes: Debris in the posterior trachea.  Unremarkable esophagus. Unchanged 1.1 cm right hilar node on series 7/image 193 and 2.5 cm subcarinal confluent node on 7/209. Lungs/Pleura: Advanced emphysema with bullous change. Upper Abdomen: No acute abnormality. Partially calcified superior segment right lower lobe nodule is unchanged measuring 2.0 by 1.3 cm (series 6/image 93). Similar right lower lobe posterior basal mass measuring 4.9 x 7.0 cm (series 6/image 102). Similar bronchial wall thickening and mucous plugging and the bilateral lower lobes and right middle lobe bronchi. Probable small right pleural effusion. No pneumothorax. Musculoskeletal: Since 05/19/2023 there is a new compression fracture of T7 with 50% vertebral body height loss centrally. No retropulsion. No evidence of underlying metastatic lesion. Review of the MIP images confirms the above findings. IMPRESSION: 1. Negative for acute pulmonary embolism. 2. Similar right lower lobe posterior basal mass, right hilar and mediastinal lymphadenopathy compared to 05/19/2023. 3. Similar bronchial wall thickening and mucous plugging in the bilateral lower lobes and right middle lobe bronchi. 4. Small right pleural effusion. 5. Since 05/19/2023 there is a new compression fracture of T7 with 50% vertebral body height loss centrally. No retropulsion. No evidence of underlying metastatic lesion. Aortic Atherosclerosis (ICD10-I70.0) and Emphysema (ICD10-J43.9). Electronically Signed   By: Rozell Cornet M.D.   On: 08/09/2023 22:28   DG Chest Portable 1 View Result Date: 08/09/2023 CLINICAL DATA:  Shortness of breath. EXAM: PORTABLE CHEST 1 VIEW COMPARISON:  October 09, 2022 FINDINGS: The heart size and mediastinal contours are within normal limits. There is evidence of extensive emphysematous lung disease. A mild component of superimposed atelectasis and/or infiltrate is noted within the mid left lung. There is moderate severity right basilar scarring, atelectasis and/or infiltrate. A component of the  patient's known right basilar lung mass is also noted. Mild left basilar linear scarring and/or atelectasis is seen. There is a small right pleural effusion. No pneumothorax is identified. Radiopaque surgical clips are seen within the right upper quadrant. The visualized skeletal structures are unremarkable. IMPRESSION: 1. Extensive emphysematous lung disease with a mild component of superimposed atelectasis and/or infiltrate within the mid left lung. 2. Moderate severity right basilar scarring, atelectasis and/or infiltrate with a component of the patient's known right basilar lung mass. 3. Small right pleural effusion. Electronically Signed   By: Virgle Grime M.D.   On: 08/09/2023 20:50   Scheduled Meds:  amLODipine   5 mg Oral Daily   atorvastatin   10 mg Oral QHS   azithromycin   500 mg Oral Daily   benztropine   2 mg Oral BID   budesonide  (PULMICORT ) nebulizer solution  0.25 mg Nebulization BID   carvedilol   3.125 mg Oral BID WC   famotidine   40 mg Oral BID   insulin  aspart  0-5 Units Subcutaneous QHS   insulin  aspart  0-9 Units Subcutaneous TID WC   ipratropium-albuterol   3 mL Nebulization BID   lidocaine   1 patch Transdermal Q24H   methylPREDNISolone  (SOLU-MEDROL ) injection  40 mg Intravenous Q12H   mirtazapine   30 mg Oral QHS   risperiDONE   3 mg Oral BID   sodium chloride  HYPERTONIC  4 mL Nebulization BID   tamsulosin   0.4 mg Oral BID   Continuous Infusions:  cefTRIAXone  (ROCEPHIN )  IV 1 g (08/10/23 2053)     LOS: 1 day    Time spent: 35 mins    Magdalene School, MD [facility]   If 7PM-7AM, please contact night-coverage

## 2023-08-11 NOTE — Progress Notes (Signed)
 Pt refusing labs currently.

## 2023-08-11 NOTE — Plan of Care (Signed)
  Problem: Education: Goal: Ability to describe self-care measures that may prevent or decrease complications (Diabetes Survival Skills Education) will improve Outcome: Not Progressing Goal: Individualized Educational Video(s) Outcome: Not Progressing   Problem: Coping: Goal: Ability to adjust to condition or change in health will improve Outcome: Not Progressing   Problem: Fluid Volume: Goal: Ability to maintain a balanced intake and output will improve Outcome: Progressing   Problem: Health Behavior/Discharge Planning: Goal: Ability to identify and utilize available resources and services will improve Outcome: Not Progressing Goal: Ability to manage health-related needs will improve Outcome: Not Progressing

## 2023-08-11 NOTE — Progress Notes (Signed)
 Mobility Specialist Progress Note;   08/11/23 1140  Mobility  Activity Refused mobility  Mobility Specialist Start Time (ACUTE ONLY) 1140   Pt refusing mobility at this time w/ max encouragement. C/o back pain throughout. Pt left in bed with all needs met.   Janit Meline Mobility Specialist Please contact via SecureChat or [employer] [phone]

## 2023-08-12 DIAGNOSIS — J441 Chronic obstructive pulmonary disease with (acute) exacerbation: Secondary | ICD-10-CM | POA: Diagnosis not present

## 2023-08-12 LAB — CBC
HCT: 35.5 % — ABNORMAL LOW (ref 39.0–52.0)
Hemoglobin: 11.7 g/dL — ABNORMAL LOW (ref 13.0–17.0)
MCH: 27.4 pg (ref 26.0–34.0)
MCHC: 33 g/dL (ref 30.0–36.0)
MCV: 83.1 fL (ref 80.0–100.0)
Platelets: 293 10*3/uL (ref 150–400)
RBC: 4.27 MIL/uL (ref 4.22–5.81)
RDW: 17.1 % — ABNORMAL HIGH (ref 11.5–15.5)
WBC: 12.8 10*3/uL — ABNORMAL HIGH (ref 4.0–10.5)
nRBC: 0 % (ref 0.0–0.2)

## 2023-08-12 LAB — COMPREHENSIVE METABOLIC PANEL WITH GFR
ALT: 33 U/L (ref 0–44)
AST: 25 U/L (ref 15–41)
Albumin: 2.5 g/dL — ABNORMAL LOW (ref 3.5–5.0)
Alkaline Phosphatase: 49 U/L (ref 38–126)
Anion gap: 12 (ref 5–15)
BUN: 11 mg/dL (ref 8–23)
CO2: 23 mmol/L (ref 22–32)
Calcium: 8.6 mg/dL — ABNORMAL LOW (ref 8.9–10.3)
Chloride: 99 mmol/L (ref 98–111)
Creatinine, Ser: 0.54 mg/dL — ABNORMAL LOW (ref 0.61–1.24)
GFR, Estimated: >60 mL/min (ref >60)
Glucose, Bld: 126 mg/dL — ABNORMAL HIGH (ref 70–99)
Potassium: 4 mmol/L (ref 3.5–5.1)
Sodium: 134 mmol/L — ABNORMAL LOW (ref 135–145)
Total Bilirubin: 0.3 mg/dL (ref 0.0–1.2)
Total Protein: 6.4 g/dL — ABNORMAL LOW (ref 6.5–8.1)

## 2023-08-12 LAB — LACTIC ACID, PLASMA: Lactic Acid, Venous: 3.3 mmol/L (ref 0.5–1.9)

## 2023-08-12 LAB — GLUCOSE, CAPILLARY: Glucose-Capillary: 201 mg/dL — ABNORMAL HIGH (ref 70–99)

## 2023-08-12 MED ORDER — PREDNISONE 20 MG PO TABS
40.0000 mg | ORAL_TABLET | Freq: Every day | ORAL | Status: DC
  Administered 2023-08-13 – 2023-08-14 (×2): 40 mg via ORAL
  Filled 2023-08-12 (×3): qty 2

## 2023-08-12 MED ORDER — POLYETHYLENE GLYCOL 3350 17 G PO PACK
17.0000 g | PACK | Freq: Every day | ORAL | Status: DC
  Administered 2023-08-13 – 2023-08-14 (×2): 17 g via ORAL
  Filled 2023-08-12 (×3): qty 1

## 2023-08-12 MED ORDER — IPRATROPIUM-ALBUTEROL 0.5-2.5 (3) MG/3ML IN SOLN
3.0000 mL | Freq: Four times a day (QID) | RESPIRATORY_TRACT | Status: DC
  Filled 2023-08-12 (×4): qty 3

## 2023-08-12 MED ORDER — IPRATROPIUM-ALBUTEROL 0.5-2.5 (3) MG/3ML IN SOLN
3.0000 mL | Freq: Two times a day (BID) | RESPIRATORY_TRACT | Status: DC
  Administered 2023-08-12 – 2023-08-14 (×4): 3 mL via RESPIRATORY_TRACT
  Filled 2023-08-12 (×6): qty 3

## 2023-08-12 MED ORDER — IPRATROPIUM-ALBUTEROL 0.5-2.5 (3) MG/3ML IN SOLN: 3.0000 mL | Freq: Two times a day (BID) | RESPIRATORY_TRACT | Status: DC

## 2023-08-12 MED ORDER — FAMOTIDINE 20 MG PO TABS
20.0000 mg | ORAL_TABLET | Freq: Two times a day (BID) | ORAL | Status: DC | PRN
  Filled 2023-08-12: qty 1

## 2023-08-12 MED ORDER — METHYLPREDNISOLONE SODIUM SUCC 40 MG IJ SOLR
INTRAMUSCULAR | Status: AC
  Filled 2023-08-12: qty 1

## 2023-08-12 MED ORDER — FAMOTIDINE 20 MG PO TABS: 20.0000 mg | ORAL_TABLET | Freq: Two times a day (BID) | ORAL | Status: DC | PRN

## 2023-08-12 MED ORDER — ACETAMINOPHEN 325 MG PO TABS
650.0000 mg | ORAL_TABLET | Freq: Four times a day (QID) | ORAL | Status: DC | PRN
  Administered 2023-08-12 – 2023-08-14 (×5): 650 mg via ORAL
  Filled 2023-08-12 (×6): qty 2

## 2023-08-12 MED ORDER — LINAGLIPTIN 5 MG PO TABS
5.0000 mg | ORAL_TABLET | Freq: Every day | ORAL | Status: DC
  Administered 2023-08-13 – 2023-08-14 (×2): 5 mg via ORAL
  Filled 2023-08-12 (×3): qty 1

## 2023-08-12 MED ORDER — PREDNISONE 20 MG PO TABS
40.0000 mg | ORAL_TABLET | Freq: Every day | ORAL | Status: DC
  Filled 2023-08-12 (×2): qty 2

## 2023-08-12 NOTE — Hospital Course (Signed)
 68 y.o. with COPD and chronic respiratory failure on 2L home O2, hx lung CA s/p SBRT, HTN, HLD, schizophrenia, and DM who presented with 1 week progressive wheezing, shortness of breath, productive cough.  In the ER, he was wheezing heavily.  CTA chest ruled out pneumonia or PE.  Did note new T7 compression fracture, incidental.

## 2023-08-12 NOTE — Progress Notes (Signed)
 Code status updated with patients sister Lars Jeziorski over the phone.  Maintain DNR/DNI  IV antibiotics ok  IV fluids ok  MOST Form to be uploaded in Epic

## 2023-08-12 NOTE — Plan of Care (Signed)
 Videos unavailable at this time. Will reassess in AM. Problem: Education: Goal: Individualized Educational Video(s) Outcome: Not Applicable

## 2023-08-12 NOTE — Progress Notes (Signed)
    Brief hospital course: 68 y.o. with COPD and chronic respiratory failure on 2L home O2, hx lung CA s/p SBRT, HTN, HLD, schizophrenia, and DM who presented with 1 week progressive wheezing, shortness of breath, productive cough.   In the ER, he was wheezing heavily.  CTA chest ruled out pneumonia or PE.  Did note new T7 compression fracture, incidental.  At present, patient denying any severe shortness of breath, cough or wheezing.  Does feel mildly improved.   Physical Exam: BP 102/60 (BP Location: Left Arm)   Pulse 93   Temp 97.6 F (36.4 C) (Oral)   Resp 16   Ht 5\' 6"  (1.676 m)   Wt 74.8 kg   SpO2 100%   BMI 26.63 kg/m   Adult male, no acute distress, partially edentulous RRR, no murmurs, no peripheral edema Respiratory rate seems normal, lung sounds overall diminished, no rales or wheezes appreciated, prolonged expiratory phase Abdomen soft without tenderness palpation or guarding Attention normal, affect blunted, mild psychomotor slowing, flat affect, generalized weakness but symmetric strength, speech fluent   Assessment and Plan: COPD exacerbation The patient was admitted and started on steroids, antibiotics.  He is weaned back to his home oxygen . -Will transition to oral prednisone  within 24 hours - Continue Rocephin  and azithromycin  - Continue Pulmicort , scheduled Duonebs - Stop hypertonic nebs     Chronic hypoxic respiratory failure On 2L at baseline. Plan to DC to hospital home program with 2 L nasal cannula chronically   Lactic acidosis:  Chronic elevated lactate in the setting of chronic respiratory failure on 2 L with COPD.  No overt sepsis noted.  Appears near baseline.   Blood cultures negative.  CT chest ruled out pneumonia.   Atypical chest pain CTA ruled out PE.  No exertional component.  No further workup necessary.   Generalized abdominal pain Evidently has had some constipation in the last few days.  No nausea or vomiting.  Did not report this pain  on the day of transition to hospital at home.  KUB showed some gaseous distention, but he was asymptomatic, tolerated oral diet, and repeat x-ray may 27th showed no resolution.  Currently tolerating advance diet.   Lung cancer CT shows stable findings. - Follow-up with pulmonology, radiation oncology   Vertebral T7 compression fracture CT showed a new compression fracture, but his pain here was well-controlled.  No retropulsion or evidence of underlying metastatic lesion. -Looks to have persistent and deconditioning  -Follow-up PT OT recommendations   Hypertension Hyperlipidemia Blood pressure normal - Continue amlodipine  and Coreg , Lipitor   Diabetes Glucose normal - Resume Januvia   Schizophrenia Stable    [facility].  Overall risks and benefits as well as work parameters have been discussed with the patient as well as his primary caregiver Hurshel Bouillon at [phone] who are in agreement.  Formal consent signed  Witness- Clarence Croak DNP

## 2023-08-12 NOTE — Progress Notes (Signed)
  Progress Note   Patient: Blake Mcknight XLK:[id number] DOB: 1955/12/02 DOA: 08/09/2023     2 DOS: the patient was seen and examined on 08/12/2023 at 8:55 AM      Brief hospital course: 68 y.o. with COPD and chronic respiratory failure on 2L home O2, hx lung CA s/p SBRT, HTN, HLD, schizophrenia, and DM who presented with 1 week progressive wheezing, shortness of breath, productive cough.  In the ER, he was wheezing heavily.  CTA chest ruled out pneumonia or PE.  Did note new T7 compression fracture, incidental.     Assessment and Plan: COPD exacerbation The patient was admitted and started on steroids, antibiotics.  He is weaned back to his home oxygen . - Continue prednisone  - Continue Rocephin  and azithromycin  - Continue Pulmicort , scheduled Duonebs - Stop hypertonic nebs   Chronic hypoxic respiratory failure On 2L at baseline  Sepsis ruled out Lactic acid level is related to respiratory disease.   Blood cultures negative.  CT chest ruled out pneumonia.  Atypical chest pain CTA ruled out PE.  No exertional component.  No further workup necessary.  Generalized abdominal pain Evidently has had some constipation in the last few days.  No nausea or vomiting.  Did not report this pain on the day of transition to hospital at home.  KUB showed some gaseous distention, but he was asymptomatic, tolerated oral diet, and repeat x-ray showed no resolution  Lung cancer CT shows stable findings. - Follow-up with pulmonology, radiation oncology  Vertebral T7 compression fracture CT showed a new compression fracture, but his pain here was well-controlled.  No retropulsion or evidence of underlying metastatic lesion. - PT/OT  Hypertension Hyperlipidemia Blood pressure normal - Continue amlodipine  and Coreg , Lipitor  Diabetes Glucose normal - Resume Januvia  Schizophrenia Stable  - Continue benztropine , Risperdal           Subjective: Patient has no complaints.  He  has no chest pain, shortness of breath.  Eating up in bed, just walk with physical therapy.     Physical Exam: BP 102/60 (BP Location: Left Arm)   Pulse 93   Temp 97.6 F (36.4 C) (Oral)   Resp 16   Ht 5\' 6"  (1.676 m)   Wt 74.8 kg   SpO2 100%   BMI 26.63 kg/m   Adult male, no acute distress, partially edentulous RRR, no murmurs, no peripheral edema Respiratory rate seems normal, lung sounds overall diminished, no rales or wheezes appreciated, prolonged expiratory phase Abdomen soft without tenderness palpation or guarding Attention normal, affect blunted, mild psychomotor slowing, flat affect, generalized weakness but symmetric strength, speech fluent    Data Reviewed: Discussed with hospital at home program CBC shows mild leukocytosis, hemoglobin 11 Comprehensive metabolic panel unremarkable Lactic acid trending down      Disposition: Status is: Inpatient Transition to hospital at home program per protocol        Author: Ephriam Hashimoto, MD 08/12/2023 1:16 PM  For on call review [email or web address].

## 2023-08-12 NOTE — Progress Notes (Addendum)
 Physical Therapy Treatment Patient Details Name: Blake Mcknight MRN: [id number] DOB: 09/23/1955 Today's Date: 08/12/2023   History of Present Illness Pt is a 68 y.o. male who presented 08/09/23 with SOB, hypoxia to 90% with EMS. Has had cough, rhinorrhea, and chills. Fluctuating compliance with home O2. PMH: COPD, HLD, HTN, GERD, BPH, DM2,  schizophrenia, lung cancer    PT Comments  Pt in bed upon arrival and agreeable to PT session. Focused today's session on safety while mobilizing with a rollator. Pt required frequent cues to engage brakes prior to standing or sitting. He was able to demonstrate turning and sitting while keeping the rollator pushed against a wall with no overt LOB. Pt ambulated 80 ft with rollator and CGA for safety. Pt declined further exercises or stair negotiation in today's session. Acute PT to follow.   96% SpO2 on 2L   If plan is discharge home, recommend the following: Help with stairs or ramp for entrance;Assistance with cooking/housework   Can travel by private vehicle      Yes  Equipment Recommendations  Rollator (4 wheels)       Precautions / Restrictions Precautions Precautions: None Recall of Precautions/Restrictions: Impaired Precaution/Restrictions Comments: watch O2 sats, T7 compression fx Restrictions Weight Bearing Restrictions Per Provider Order: No     Mobility  Bed Mobility Overal bed mobility: Modified Independent   Transfers Overall transfer level: Needs assistance Equipment used: Rollator (4 wheels) Transfers: Sit to/from Stand Sit to Stand: Supervision    General transfer comment: supervision for safety with cues to lock brakes prior to standing/sitting. Increased difficulty problem solving with engaging brakes    Ambulation/Gait Ambulation/Gait assistance: Contact guard assist Gait Distance (Feet): 80 Feet Assistive device: Rollator (4 wheels) Gait Pattern/deviations: Decreased stride length Gait velocity: decreased      General Gait Details: steady with rollator, cues for proximity to rollator    Balance Overall balance assessment: Mild deficits observed, not formally tested       Communication Communication Communication: No apparent difficulties  Cognition Arousal: Alert Behavior During Therapy: Flat affect   PT - Cognitive impairments: No family/caregiver present to determine baseline    PT - Cognition Comments: difficulty remembering cues to lock/unlock brakes. Cues during session to navigate in the hallway and for safety. Will answer simple questions Following commands: Intact      Cueing Cueing Techniques: Verbal cues, Tactile cues     General Comments General comments (skin integrity, edema, etc.): Discussed having rollator pressed into wall before engaging brakes to sit.      Pertinent Vitals/Pain Pain Assessment Pain Assessment: Faces Faces Pain Scale: Hurts even more Pain Location: back Pain Descriptors / Indicators: Aching Pain Intervention(s): Limited activity within patient's tolerance, Monitored during session, Repositioned     PT Goals (current goals can now be found in the care plan section) Acute Rehab PT Goals PT Goal Formulation: With patient Time For Goal Achievement: 08/24/23 Potential to Achieve Goals: Fair Progress towards PT goals: Progressing toward goals    Frequency    Min 2X/week       AM-PAC PT "6 Clicks" Mobility   Outcome Measure  Help needed turning from your back to your side while in a flat bed without using bedrails?: None Help needed moving from lying on your back to sitting on the side of a flat bed without using bedrails?: None Help needed moving to and from a bed to a chair (including a wheelchair)?: A Little Help needed standing up from a chair using  your arms (e.g., wheelchair or bedside chair)?: A Little Help needed to walk in hospital room?: A Little Help needed climbing 3-5 steps with a railing? : A Little 6 Click Score: 20     End of Session Equipment Utilized During Treatment: Oxygen ;Gait belt Activity Tolerance: Patient tolerated treatment well Patient left: in chair;with call bell/phone within reach Nurse Communication: Mobility status PT Visit Diagnosis: Difficulty in walking, not elsewhere classified (R26.2)     Time: 0272-5366 PT Time Calculation (min) (ACUTE ONLY): 19 min  Charges:    $Gait Training: 8-22 mins PT General Charges $$ ACUTE PT VISIT: 1 Visit                    Orysia Blas, PT, DPT Secure Chat Preferred  Rehab Office [phone]   Alissa April Adela Ades 08/12/2023, 10:01 AM

## 2023-08-12 NOTE — Plan of Care (Signed)
  Problem: Education: Goal: Knowledge of disease or condition will improve Outcome: Progressing   Problem: Activity: Goal: Ability to tolerate increased activity will improve Outcome: Progressing Goal: Will verbalize the importance of balancing activity with adequate rest periods Outcome: Progressing   Problem: Respiratory: Goal: Ability to maintain a clear airway will improve Outcome: Progressing   Problem: Activity: Goal: Ability to tolerate increased activity will improve Outcome: Progressing

## 2023-08-13 ENCOUNTER — Other Ambulatory Visit (HOSPITAL_COMMUNITY): Payer: Self-pay

## 2023-08-13 DIAGNOSIS — J441 Chronic obstructive pulmonary disease with (acute) exacerbation: Secondary | ICD-10-CM | POA: Diagnosis not present

## 2023-08-13 LAB — COMPREHENSIVE METABOLIC PANEL WITH GFR
ALT: 40 U/L (ref 0–44)
AST: 27 U/L (ref 15–41)
Albumin: 2.6 g/dL — ABNORMAL LOW (ref 3.5–5.0)
Alkaline Phosphatase: 49 U/L (ref 38–126)
Anion gap: 11 (ref 5–15)
BUN: 17 mg/dL (ref 8–23)
CO2: 27 mmol/L (ref 22–32)
Calcium: 8.8 mg/dL — ABNORMAL LOW (ref 8.9–10.3)
Chloride: 94 mmol/L — ABNORMAL LOW (ref 98–111)
Creatinine, Ser: 0.56 mg/dL — ABNORMAL LOW (ref 0.61–1.24)
GFR, Estimated: >60 mL/min (ref >60)
Glucose, Bld: 124 mg/dL — ABNORMAL HIGH (ref 70–99)
Potassium: 3.3 mmol/L — ABNORMAL LOW (ref 3.5–5.1)
Sodium: 132 mmol/L — ABNORMAL LOW (ref 135–145)
Total Bilirubin: 0.2 mg/dL (ref 0.0–1.2)
Total Protein: 6.4 g/dL — ABNORMAL LOW (ref 6.5–8.1)

## 2023-08-13 LAB — CBC
HCT: 35.6 % — ABNORMAL LOW (ref 39.0–52.0)
Hemoglobin: 11.9 g/dL — ABNORMAL LOW (ref 13.0–17.0)
MCH: 27.9 pg (ref 26.0–34.0)
MCHC: 33.4 g/dL (ref 30.0–36.0)
MCV: 83.6 fL (ref 80.0–100.0)
Platelets: 308 10*3/uL (ref 150–400)
RBC: 4.26 MIL/uL (ref 4.22–5.81)
RDW: 17.1 % — ABNORMAL HIGH (ref 11.5–15.5)
WBC: 15.3 10*3/uL — ABNORMAL HIGH (ref 4.0–10.5)
nRBC: 0 % (ref 0.0–0.2)

## 2023-08-13 MED ORDER — ALBUTEROL SULFATE (2.5 MG/3ML) 0.083% IN NEBU
2.5000 mg | INHALATION_SOLUTION | RESPIRATORY_TRACT | 0 refills | Status: DC | PRN
  Filled 2023-08-13 (×2): qty 75, 5d supply, fill #0

## 2023-08-13 MED ORDER — ACETAMINOPHEN 325 MG PO TABS: 650.0000 mg | ORAL_TABLET | Freq: Four times a day (QID) | ORAL | Status: DC | PRN

## 2023-08-13 MED ORDER — LIDOCAINE 5 % EX PTCH
1.0000 | MEDICATED_PATCH | CUTANEOUS | Status: DC
  Administered 2023-08-13 – 2023-08-14 (×2): 1 via TRANSDERMAL
  Filled 2023-08-13 (×3): qty 1

## 2023-08-13 MED ORDER — IPRATROPIUM-ALBUTEROL 0.5-2.5 (3) MG/3ML IN SOLN
3.0000 mL | RESPIRATORY_TRACT | 0 refills | Status: DC
  Filled 2023-08-13 (×2): qty 360, 20d supply, fill #0

## 2023-08-13 MED ORDER — BUDESONIDE 0.25 MG/2ML IN SUSP
0.2500 mg | Freq: Two times a day (BID) | RESPIRATORY_TRACT | 2 refills | Status: DC
  Filled 2023-08-13 (×2): qty 60, 15d supply, fill #0

## 2023-08-13 MED ORDER — TRAMADOL HCL 50 MG PO TABS: 50.0000 mg | ORAL_TABLET | Freq: Four times a day (QID) | ORAL | 0 refills | Status: DC | PRN

## 2023-08-13 MED ORDER — TRAMADOL HCL 50 MG PO TABS: 50.0000 mg | ORAL_TABLET | Freq: Four times a day (QID) | ORAL | Status: DC | PRN

## 2023-08-13 MED ORDER — MELATONIN 5 MG PO TABS
5.0000 mg | ORAL_TABLET | Freq: Every evening | ORAL | 1 refills | Status: DC | PRN
  Filled 2023-08-13 (×2): qty 30, 30d supply, fill #0

## 2023-08-13 MED ORDER — METHOCARBAMOL 750 MG PO TABS
750.0000 mg | ORAL_TABLET | Freq: Three times a day (TID) | ORAL | Status: DC | PRN
  Administered 2023-08-13 – 2023-08-14 (×2): 750 mg via ORAL
  Filled 2023-08-13 (×3): qty 1

## 2023-08-13 MED ORDER — MELATONIN 5 MG PO TABS
5.0000 mg | ORAL_TABLET | Freq: Every evening | ORAL | Status: DC | PRN
  Filled 2023-08-13: qty 1

## 2023-08-13 MED ORDER — SENNOSIDES-DOCUSATE SODIUM 8.6-50 MG PO TABS
1.0000 | ORAL_TABLET | Freq: Every evening | ORAL | Status: DC | PRN
  Filled 2023-08-13: qty 1

## 2023-08-13 MED ORDER — PREDNISONE 10 MG PO TABS
ORAL_TABLET | ORAL | 0 refills | Status: DC
  Filled 2023-08-13 (×2): qty 12, 6d supply, fill #0

## 2023-08-13 MED ORDER — METHOCARBAMOL 750 MG PO TABS
750.0000 mg | ORAL_TABLET | Freq: Three times a day (TID) | ORAL | 0 refills | Status: DC | PRN
  Filled 2023-08-13: qty 30, 10d supply, fill #0

## 2023-08-13 MED ORDER — TRAMADOL HCL 50 MG PO TABS
50.0000 mg | ORAL_TABLET | Freq: Four times a day (QID) | ORAL | Status: DC | PRN
  Administered 2023-08-13 – 2023-08-14 (×3): 50 mg via ORAL
  Filled 2023-08-13 (×5): qty 1

## 2023-08-13 MED ORDER — DM-GUAIFENESIN ER 30-600 MG PO TB12
1.0000 | ORAL_TABLET | Freq: Two times a day (BID) | ORAL | Status: DC
  Administered 2023-08-13 – 2023-08-14 (×3): 1 via ORAL
  Filled 2023-08-13 (×5): qty 1

## 2023-08-13 MED ORDER — GLUCERNA SHAKE PO LIQD
237.0000 mL | Freq: Three times a day (TID) | ORAL | Status: DC
  Administered 2023-08-13 – 2023-08-14 (×3): 237 mL via ORAL
  Filled 2023-08-13 (×6): qty 237

## 2023-08-13 MED ORDER — IBUPROFEN 400 MG PO TABS
400.0000 mg | ORAL_TABLET | Freq: Four times a day (QID) | ORAL | Status: DC | PRN
  Administered 2023-08-13 – 2023-08-14 (×2): 400 mg via ORAL
  Filled 2023-08-13 (×3): qty 1

## 2023-08-13 MED ORDER — POTASSIUM CHLORIDE CRYS ER 20 MEQ PO TBCR
40.0000 meq | EXTENDED_RELEASE_TABLET | Freq: Once | ORAL | Status: AC
  Administered 2023-08-13: 40 meq via ORAL
  Filled 2023-08-13: qty 2

## 2023-08-13 NOTE — Plan of Care (Signed)
 Pt ordered a protein glucerna for a supplement.

## 2023-08-13 NOTE — TOC Progression Note (Signed)
 Transition of Care [facility]) - Progression Note    Patient Details  Name: Blake Mcknight MRN: [id number] Date of Birth: 01-24-1956  [facility]) CM/SW Contact  Joanette Moynahan, RN Phone Number: 08/13/2023, 11:35 AM  Clinical Narrative:     [facility] consulted regarding discharge planning.  RNCM given permission to speak with Annabell Baron, sister regarding discharge needs.  RNCM has recommended that this patient have Home Health and Palliative Services but pt  and  sister declines at this time. I have discussed the benefits of home health services and palliative care as wll as the risks of not having home health services with pt. RNCM informed patient that if he changed his mind, His primary care physician can order home health from office.  RNCM offered ramp rental.  Sister states that if insurance will pay for/provide, they will accept, but they can not pay at this time.  Pt qualifies for DME (Durable Medical Equipment) rollator.  DME  ordered through [employer].  Marlou Sims of [employer] notified to deliver DME to pt home.    The patient verbalizes understanding. No further RNCM needs identified at this time.       Expected Discharge Plan and Services                                               Social Determinants of Health (SDOH) Interventions SDOH Screenings   Food Insecurity: Food Insecurity Present (08/10/2023)  Housing: Low Risk  (08/10/2023)  Transportation Needs: No Transportation Needs (08/10/2023)  Utilities: At Risk (08/10/2023)  Social Connections: Socially Isolated (08/10/2023)  Tobacco Use: Medium Risk (08/09/2023)    Readmission Risk Interventions    04/17/2023   11:09 AM  Readmission Risk Prevention Plan  Transportation Screening Complete  PCP or Specialist Appt within 5-7 Days Complete  Home Care Screening Complete  Medication Review (RN CM) Complete

## 2023-08-13 NOTE — Progress Notes (Signed)
 Spoke with patient and sister. Pain is improving with tramadol  and breathing is easier since prn Proventil .  Sister will call when she is ready to administer HS medications.

## 2023-08-13 NOTE — Plan of Care (Signed)
 More cooperative today. Mild SHOB with activity, utilizing PRN breathing treatments with good effect. C/o back pain/spams from lifting something heavy last week. Had good response from tramadol. Advised tyelnol with bedtime medications to prevent severe morning pain. Provided education concerning sequence of bronchodialtor and steroid nebulizer. Discussed the purpose of mucinex . Sister expressed understanding.

## 2023-08-13 NOTE — Plan of Care (Addendum)
 Patient wants to sleep and adjusts his medication schedule accordingly. Some medications have been refused.

## 2023-08-13 NOTE — Progress Notes (Signed)
 Sister called. Mr Basnett is requesting a breathing treatment for [facility] without distress. O2 sat 98%. He is speaking clear sentenced without difficulty. Watched sister administer PRN Proventil .

## 2023-08-13 NOTE — Progress Notes (Signed)
 Arrived at home and performed initial safety assessment for patient admission to Hospital at Home visit. Upon assessment, patient's entrance to home has a large culvert patient has to step over to get to entrance. Patient was able to ambulate with minimal assistance up 3 steps to entrance. Pt's bed is located in first room upon entrance. Pt's sister is caregiver and states Pt spends majority of his time in this area. Home has carpeting throughout with tile in bathroom and kitchen. Living area is small but appears clean and walkways are clear; Pt is able to ambulate safely throughout. Pt is able to walk to bathroom as needed. Pt also uses a Tupperware container as a Scientist, research (medical). I offered Pt's sister a hospital provided urinal and she declines and states the Tupperware container works best for Pt. Container appears very clean and Pt's sister states she cleans it after every use. Pt's sister states pt was given urinal upon leaving the hospital so they do have it as a back up. One small table in second room moved slightly to allow easier movement through to bathroom. Pt's sister showed me motion detection lights she has provided so Pt has adequate lighting when going to bathroom at night. Pt's sister states she usually helps Pt with bed baths but occasionally Pt does use shower. Pt's shower is a shower tub combo and Pt has to step over in order to get in. I recommended while Pt is recovering and on Hospital at Home program, it would be best to avoid using shower and Pt's sister agrees. No other hazards noted.

## 2023-08-13 NOTE — TOC Initial Note (Signed)
 Transition of Care [facility]) - Initial/Assessment Note    Patient Details  Name: Blake Mcknight MRN: [id number] Date of Birth: [birth year]  Transition of Care [facility]) CM/SW Contact:    Joanette Moynahan, RN Phone Number: 08/13/2023, 8:54 AM  Clinical Narrative:                 68 yo male from home alone participating in hospital at home program.  [facility] consulted regarding home palliative care establishment and DME rollator for mobility.  RNCM will provide list of home palliative agencies and update team with pt choice.      Patient Goals and CMS Choice  To stay in home with palliative care and obtain rollator for mobility.        Expected Discharge Plan and Services  1.  Home with palliative care   2.  Rollator                                          Prior Living Arrangements/Services                       Activities of Daily Living   ADL Screening (condition at time of admission) Independently performs ADLs?: No Does the patient have a NEW difficulty with bathing/dressing/toileting/self-feeding that is expected to last >3 days?: No Does the patient have a NEW difficulty with getting in/out of bed, walking, or climbing stairs that is expected to last >3 days?: Yes (Initiates electronic notice to provider for possible PT consult) Does the patient have a NEW difficulty with communication that is expected to last >3 days?: No Is the patient deaf or have difficulty hearing?: No Does the patient have difficulty seeing, even when wearing glasses/contacts?: No Does the patient have difficulty concentrating, remembering, or making decisions?: Yes  Permission Sought/Granted                  Emotional Assessment              Admission diagnosis:  COPD exacerbation (HCC) [J44.1] SIRS (systemic inflammatory response syndrome) (HCC) [R65.10] Patient Active Problem List   Diagnosis Date Noted   Chest pain 08/10/2023   COPD exacerbation (HCC) 08/09/2023    CAP (community acquired pneumonia) 04/16/2023   Right upper quadrant abdominal pain 04/16/2023   Abnormal CT of the chest 03/05/2022   Malignant neoplasm of lower lobe of right lung (HCC) 07/17/2021   COPD with acute exacerbation (HCC) 09/02/2020   Hypoxia    Do not resuscitate 02/18/2019   Chronic hypoxemic respiratory failure (HCC) 05/25/2016   Bullous emphysema (HCC) 07/26/2013   Schizophrenia (HCC) 06/28/2013   Tobacco use disorder 06/06/2013   Abdominal pain 06/03/2013   HTN (hypertension) 06/03/2013   HLD (hyperlipidemia) 06/03/2013   COPD (chronic obstructive pulmonary disease) (HCC) 06/03/2013   Calculus of bile duct 06/03/2013   PCP:  Jacqueline Matsu, MD Pharmacy:   [facility] [phone] Jonette Nestle, [place] - [address] AT [facility] [address] [place] [address] Phone: [phone] Fax: [phone]  [facility] [address] [place] [address] Phone: [phone] Fax: [phone]     Social Drivers of Health (SDOH) Social History: SDOH Screenings   Food Insecurity: Food Insecurity Present (08/10/2023)  Housing: Low Risk  (08/10/2023)  Transportation Needs: No Transportation Needs (08/10/2023)  Utilities: At Risk (08/10/2023)  Social Connections: Socially Isolated (08/10/2023)  Tobacco Use: Medium Risk (08/09/2023)   SDOH Interventions:     Readmission Risk Interventions    04/17/2023   11:09 AM  Readmission Risk Prevention Plan  Transportation Screening Complete  PCP or Specialist Appt within 5-7 Days Complete  Home Care Screening Complete  Medication Review (RN CM) Complete

## 2023-08-13 NOTE — Progress Notes (Addendum)
 Progress Note   Patient: Blake Mcknight JYN:[id number] DOB: 04-Apr-1955 DOA: 08/09/2023     3 DOS: the patient was seen and examined on 08/13/2023   Brief hospital course:  "68 y.o. with COPD and chronic respiratory failure on 2L home O2, hx lung CA s/p SBRT, HTN, HLD, schizophrenia, and DM who presented with 1 week progressive wheezing, shortness of breath, productive cough.  In the ER, he was wheezing heavily.  CTA chest ruled out pneumonia or PE.  Did note new T7 compression fracture, incidental."  Patient was admitted on evening of 08/09/2023 for further evaluation and management of COPD exacerbation, treated with IV steroids, Rocephin , Zithromax , nebulized corticosteroids and bronchodilators.  Oxygen  requirements remained stable on baseline 2 L/min by nasal cannula.  On 08/12/2023, patient was transferred to [facility] program for further management as outlined in detail below.     Assessment and Plan:  COPD with acute exacerbation - improving --Continue Prednisone  40 mg daily, taper on discharge --Rocephin  & Zithromax  to complete 5 days --Pulmicort  nebs and Duonebs BID --Albuterol  nebs Q2H PRN --Add scheduled Mucinex  BID --Home maintenance inhaler (Breztri vs Trelegy?) is held, resume at time of d/c --Plan to d/c with nebs caregiver may use in place of maintenance inhaler if pt is unable to unwilling to take inhaler  Chronic respiratory failure with hypoxia - stable on baseline 2 L/min [place] O2. --Supplement O2 to maintain spO2 88-93% --Mgmt of COPD as above  Sepsis  ruled out. Lactic acidosis - due to respiratory disease. Blood cultures negative --Monitor fever curve and clinically for signs/symptoms of infection  Atypical chest pain Non-cardiac (troponin normal x 2 on admission and EKG was non-acute. Bilateral back pain -- suspect musculoskeletal in setting of PNA and compression fracture, and sister reported pt may have done some heavier lifting prior to admission.   Vertebral T7 compression fracture CT showed a new compression fracture.   No retropulsion or evidence of underlying metastatic lesion. 5/29 -- Pain has been poorly controlled on Tylenol . --Tylenol , ibuprofen and Robaxin PRN --Tramadol 50 mg Q6H PRN for pain not controlled by above --Add Lidocaine  patch daily --Monitor closely for improvement --TOC consulted for any DME and HH needs  Lung cancer, right lower lobe - presumed NSCLC. Most recent pulmonology encounter of 07/09/2023 with Dr. Baldwin Levee reviewed. Some concern for progression on recent imaging.  Not candidate for EBUS and unlikely candidate for aggressive treatments.   --Palliative care being engaged by [facility] to follow patient after discharge --Pulmonology plans for repeat CT scan in 3 months --Follow up with Pulmonology outpatient  Generalized abdominal pain - seems resolved by time of transfer to [facility]. Suspected due to Constipation.  No nausea or vomiting.   KUB showed gaseous distention. --Stool softeners  --Monitor closely    Chronic co-morbidities  Hypertension -- stable, BP's controlled Hyperlipidemia --Continue amlodipine  and Coreg , Lipitor   Type 2 Diabetes -- well-controlled with HbA1c 5.9% CBG's overall at inpatient goal of 140-180 Note pt on steroids, expect some hyperglycemia --Continue Tradjenta (formulary sub for home Januvia)   Schizophrenia -- Stable  --Continue benztropine , Risperdal        Subjective: Patient seen virtually with [facility] ground team in the home this AM, with patient's sister and caregiver also present.  Pt continues to have significant mid-back pain, bilateral.  He states pain not very well-controlled with just Tylenol .  He reports breathing is okay.    Patient was given Tylenol  for back pain around 10 AM today, still having moderate  pain at time of our encounter, but somewhat improved.  Paramedic also noted pt had removed his nasal cannula for about an hour and O2 sat was found to be 92% on  their arrival.   Physical Exam:  NOTE: physical exam below is based on Paramedic's in-person exam and my observations from virtual encounter.  Vitals:   08/12/23 1400 08/12/23 1700 08/12/23 1829 08/13/23 0400  BP:  (P) 114/76 132/88   Pulse:  (!) 108 (!) 102 81  Resp:  (P) 20  15  Temp: (P) 97.8 F (36.6 C) (!) 97.3 F (36.3 C)    TempSrc: (P) Oral Oral    SpO2:  (P) 98%  95%  Weight:      Height:        General exam: awake, alert, no acute distress HEENT: hearing grossly normal  Respiratory system: left side expiratory wheeze, right side mildly coarse, diminished bases, normal respiratory effort at rest, on 2 L/min [place] O2 Cardiovascular system: normal S1/S2, RRR, no peripheral edema.   Gastrointestinal system: soft, NT, ND Central nervous system: no gross focal neurologic deficits, normal speech Extremities: moves all, no edema, normal tone Skin: dry, intact, no rashes seen Psychiatry: normal mood, flat affect    Data Reviewed:  Notable labs from 5/28 ---   Na 134 Glucose 126 Cr 0.54 Ca 8.6 Albumin 2.5  Lactic acid 4.2 >> 3.3 (elevated due to respiratory disease, not sepsis)  WBC 12.8   Dispo: Anticipate potential discharge as soon as tomorrow if ongoing improvement  in respiratory status and pain controlled.   Family Communication: Sister, Blake Mcknight, was present during rounds  Disposition: Status is: Inpatient Remains inpatient appropriate because: persistent wheezing and uncontrolled pain with medication changes underway.  Requires further clinical improvement prior to discharge.   Planned Discharge Destination: Home with Home Health    Time spent: 52 minutes including time during encounter and in coordination of care with [facility] team and ancillary staff.   Author: Montey Apa, DO 08/13/2023 7:40 AM  For on call review [email or web address].

## 2023-08-13 NOTE — Plan of Care (Signed)
  Problem: Respiratory: Goal: Ability to maintain a clear airway will improve Outcome: Progressing  Education with return demonstration on use

## 2023-08-13 NOTE — Progress Notes (Addendum)
 Education with patient and sister on pain and administration of medication. Sister (caretaker)  verbalized understanding.

## 2023-08-14 ENCOUNTER — Encounter (HOSPITAL_COMMUNITY): Payer: Self-pay | Admitting: Internal Medicine

## 2023-08-14 DIAGNOSIS — M545 Low back pain, unspecified: Secondary | ICD-10-CM | POA: Diagnosis present

## 2023-08-14 LAB — CULTURE, BLOOD (ROUTINE X 2)
Culture: NO GROWTH
Culture: NO GROWTH
Special Requests: ADEQUATE

## 2023-08-14 MED ORDER — ACETAMINOPHEN 500 MG PO TABS
1000.0000 mg | ORAL_TABLET | Freq: Three times a day (TID) | ORAL | Status: DC
  Administered 2023-08-14: 1000 mg via ORAL
  Filled 2023-08-14 (×2): qty 2

## 2023-08-14 MED ORDER — IPRATROPIUM-ALBUTEROL 0.5-2.5 (3) MG/3ML IN SOLN: 3.0000 mL | RESPIRATORY_TRACT | Status: AC

## 2023-08-14 MED ORDER — ATORVASTATIN CALCIUM 10 MG PO TABS: 10.0000 mg | ORAL_TABLET | Freq: Every day | ORAL | 1 refills | Status: AC

## 2023-08-14 MED ORDER — METHOCARBAMOL 750 MG PO TABS: 750.0000 mg | ORAL_TABLET | Freq: Four times a day (QID) | ORAL | 0 refills | Status: DC | PRN

## 2023-08-14 MED ORDER — DM-GUAIFENESIN ER 30-600 MG PO TB12: 1.0000 | ORAL_TABLET | Freq: Two times a day (BID) | ORAL | 0 refills | Status: AC

## 2023-08-14 MED ORDER — LIDOCAINE 5 % EX PTCH: 1.0000 | MEDICATED_PATCH | CUTANEOUS | 0 refills | Status: DC

## 2023-08-14 MED ORDER — METHOCARBAMOL 750 MG PO TABS
750.0000 mg | ORAL_TABLET | Freq: Four times a day (QID) | ORAL | Status: DC | PRN
  Filled 2023-08-14: qty 1

## 2023-08-14 MED ORDER — IPRATROPIUM-ALBUTEROL 0.5-2.5 (3) MG/3ML IN SOLN: 3.0000 mL | Freq: Three times a day (TID) | RESPIRATORY_TRACT | 0 refills | Status: DC

## 2023-08-14 MED ORDER — PREDNISONE 10 MG PO TABS: ORAL_TABLET | ORAL | 0 refills | Status: AC

## 2023-08-14 MED ORDER — MELATONIN 5 MG PO TABS: 5.0000 mg | ORAL_TABLET | Freq: Every evening | ORAL | 1 refills | Status: DC | PRN

## 2023-08-14 MED ORDER — METHOCARBAMOL 750 MG PO TABS: 750.0000 mg | ORAL_TABLET | Freq: Three times a day (TID) | ORAL | 0 refills | Status: DC | PRN

## 2023-08-14 MED ORDER — BUDESONIDE 0.25 MG/2ML IN SUSP: 0.2500 mg | Freq: Two times a day (BID) | RESPIRATORY_TRACT | 2 refills | Status: DC

## 2023-08-14 MED ORDER — IBUPROFEN 400 MG PO TABS: 400.0000 mg | ORAL_TABLET | Freq: Four times a day (QID) | ORAL | Status: DC | PRN

## 2023-08-14 MED ORDER — GLUCERNA SHAKE PO LIQD: 237.0000 mL | Freq: Three times a day (TID) | ORAL | Status: AC

## 2023-08-14 MED ORDER — TRAMADOL HCL 50 MG PO TABS: 50.0000 mg | ORAL_TABLET | Freq: Four times a day (QID) | ORAL | 0 refills | Status: DC | PRN

## 2023-08-14 MED ORDER — POTASSIUM CHLORIDE CRYS ER 20 MEQ PO TBCR: 20.0000 meq | EXTENDED_RELEASE_TABLET | Freq: Two times a day (BID) | ORAL | 0 refills | Status: DC

## 2023-08-14 MED ORDER — ACETAMINOPHEN 500 MG PO TABS
1000.0000 mg | ORAL_TABLET | Freq: Three times a day (TID) | ORAL | 0 refills | Status: AC
Start: 2023-08-14 — End: ?

## 2023-08-14 MED ORDER — ACETAMINOPHEN 500 MG PO TABS: 1000.0000 mg | ORAL_TABLET | Freq: Three times a day (TID) | ORAL | Status: DC

## 2023-08-14 NOTE — TOC Transition Note (Signed)
 Transition of Care [facility]) - Discharge Note   Patient Details  Name: Blake Mcknight MRN: [id number] Date of Birth: 1955/04/21  [facility] CM/SW Contact:  Joanette Moynahan, RN Phone Number: 08/14/2023, 11:15 AM   Clinical Narrative:    68 yo male with COPD with acute exacerbation, chronic respiratory failure with hypoxia.  Plan is to d/c today with nebs prn and when pt unwilling to take inhaler and continue home O2 at 2L. Pt currently in home with his sister and caregiver, [name].     Patient Goals and CMS Choice   Pt wants to discharge home with home health PT, Home Palliative Care, DME rollator and PCP follow-up appointment.  RNCM spoke with pt  and caregiver via zoom regarding discharge planning for Home Health Services. RNCM offered home health services for PT.  Caregiver and pt agreed to have home PT and to be referred to home health agencies.  RNCM referred pt to [facility] and spoke with Bartholomew Light, liaison and they accepted the referral.  Bartholomew Light will be in contact with the pt later today.  RNCM offered home palliative services.  Pt and caregiver accepted to be referred to home hospice  agencies.  RNCM contacted Madelene Schanz with [facility],and they accepted.  Adolm Ahumada will be in contact with the pt later today.  RNCM followed up with [employer] regarding rollator deliver.  [employer] rep, Marlou Sims states [employer] will not charge delivery fee and rollator is en route to pt home now.  RNCM advised that moving forward, he will deliver to hospital and we will deliver to pt home.  RNCM discussed follow-up PCP appointment with pt and caregiver.  Caregiver states that he has an appointment early next week (Tuesday or Wednesday-she did not have her calendar on hand to verify).   [name] mentioned that pt may be out of Lipitor Rx and may need a refill.  Information relayed to team to send Rx to pharmacy of choice.   Aaron Aas                Discharge Plan and Services Home with home health PT, home  Palliative and DME rolling walker.                                       Social Drivers of Health (SDOH) Interventions SDOH Screenings   Food Insecurity: Food Insecurity Present (08/10/2023)  Housing: Low Risk  (08/10/2023)  Transportation Needs: No Transportation Needs (08/10/2023)  Utilities: At Risk (08/10/2023)  Social Connections: Socially Isolated (08/10/2023)  Tobacco Use: Medium Risk (08/09/2023)     Readmission Risk Interventions    04/17/2023   11:09 AM  Readmission Risk Prevention Plan  Transportation Screening Complete  PCP or Specialist Appt within 5-7 Days Complete  Home Care Screening Complete  Medication Review (RN CM) Complete

## 2023-08-14 NOTE — Progress Notes (Signed)
 Pt was a transfer on 5/28 at 12:53. AVS documentation changed to reflect that

## 2023-08-14 NOTE — Discharge Summary (Addendum)
 Physician Discharge Summary   Patient: Blake Mcknight MRN: [id number] DOB: 02-Jun-1955  Admit date:     08/09/2023  Discharge date: 08/14/23  Discharge Physician: Montey Apa   PCP: Jacqueline Matsu, MD   Recommendations at discharge:   Follow up with Primary Care, Dr. Eladio Graven, next week as previously scheduled Repeat CBC, CMP at follow up Follow up on low back pain and recommend outpatient imaging if not improving with conservative management in the next 2-3 weeks Follow up as scheduled with Pulmonology Follow up with [facility] for outpatient Palliative Care services    Discharge Diagnoses: Active Problems:   Chronic hypoxemic respiratory failure (HCC)   Low back pain   HTN (hypertension)   HLD (hyperlipidemia)   Schizophrenia (HCC)  Principal Problem (Resolved):   COPD exacerbation (HCC) Resolved Problems:   Abdominal pain   Chest pain  Hospital Course:  "68 y.o. with COPD and chronic respiratory failure on 2L home O2, hx lung CA s/p SBRT, HTN, HLD, schizophrenia, and DM who presented with 1 week progressive wheezing, shortness of breath, productive cough.   In the ER, he was wheezing heavily.  CTA chest ruled out pneumonia or PE.  Did note new T7 compression fracture, incidental."   Patient was admitted on evening of 08/09/2023 for further evaluation and management of COPD exacerbation, treated with IV steroids, Rocephin , Zithromax , nebulized corticosteroids and bronchodilators.  Oxygen  requirements remained stable on baseline 2 L/min by nasal cannula.   On 08/12/2023, patient was transferred to [facility] program for further management as outlined in detail below.    5/29 -- some wheezing, O2 requirement stable on 2 L/min baseline.  Persistent low back pain, improved after medication changes as below.  5/30 -- stable respiratory status, significantly improved since admission.  Back pain is ongoing but improves with medications.  Patient has clinically  improved and is medically stable for discharge from [facility] today.  He has follow up scheduled with Primary Care early next week.   Assessment and Plan:  COPD with acute exacerbation - Improved since admission.  Stable at baseline. --Prednisone  taper on discharge 30 mg x 2 days>> 20 mg x 2 days >>10 mg x 2 days --Completed 5 day course Rocephin  & Zithromax   --Treated with Pulmicort  nebs, scheduled Duonebs, Albuterol  nebs Q2H PRN --Scheduled Mucinex  BID  For discharge: --Resume Duonebs and PRN albuterol  up to every 4 hours --Resume maintenance inhaler Breztri (vs ?recently changed to Trelegy)  --Rx for Pulmicort  nebs sent -- this may be used in place of maintenance inhaler if pt is unable to unwilling to take inhaler; may prevent respiratory decompensation and readmission --Follow up with Pulmonology   Chronic respiratory failure with hypoxia - stable on baseline 2 L/min [place] O2. --Supplement O2 to maintain spO2 88-93% --Mgmt of COPD as above  Steroid-induced leukocytosis --Repeat CBC at follow up in 1-2 weeks  Hypokalemia - mild, K 3.3 on 5/29 was replaced.  K improved 3.5 --Resume home potassium supplement on d/c --Repeat BMP at follow up   Sepsis  ruled out. Lactic acidosis - due to respiratory disease. Blood cultures negative --Monitor fever curve and clinically for signs/symptoms of infection  Bilateral lower back pain and tightness -- suspect musculoskeletal in setting of PNA and compression fracture, and sister reported pt had done some heavier than normal lifting prior to admission.   No red flag symptoms of saddle anesthesia, frank leg weakness or changes in bowel or bladder function. Suspect this will improve  with conservative management and time. 5/29 -- Pain has been poorly controlled on Tylenol .  Added ibuprofen, Robaxin, Lidocaine  patch.  Tramadol PRN as well. --Tylenol , ibuprofen and Robaxin PRN --Lidocaine  patch daily --Heating pad to lower back  15-20 minutes at a time --Tramadol 50 mg Q6H PRN for pain not controlled by above --Monitor closely for improvement --TOC consulted for any DME and HH needs --Follow up scheduled with Primary Care next week --Recommend outpatient imaging if persistent or not improving in 2-3 weeks  Vertebral T7 compression fracture -  CT showed a new compression fracture (since Mar, exacty acuity unclear).  No retropulsion or evidence of underlying metastatic lesion. --Mgmt as above for back pain   Atypical chest pain Resolved.  Present on admission. Non-cardiac (troponin normal x 2 on admission and EKG was non-acute.   Suspect due to COPD exacerbation.   Lung cancer, right lower lobe - presumed NSCLC. Most recent pulmonology encounter of 07/09/2023 with Dr. Baldwin Levee reviewed. Some concern for progression on recent imaging.  Not candidate for EBUS and unlikely candidate for aggressive treatments.   --[facility] to follow as outpatient after discharge --Pulmonology plans for repeat CT scan in 3 months --Follow up with Pulmonology outpatient, as scheduled   Generalized abdominal pain - seems resolved by time of transfer to [facility]. Suspected due to Constipation.  No nausea or vomiting.   KUB showed gaseous distention. --Stool softeners  --Monitor closely     Chronic co-morbidities   Hypertension -- stable, BP's controlled Hyperlipidemia --Continue amlodipine  and Coreg , Lipitor   Type 2 Diabetes -- well-controlled with HbA1c 5.9% CBG's overall at inpatient goal of 140-180 Note pt on steroids, expect some hyperglycemia --Continue Tradjenta (formulary sub for home Januvia)   Schizophrenia -- Stable  --Continue benztropine , Risperdal        Consultants: None Procedures performed: None  Disposition: Home Diet recommendation:  Regular diet DISCHARGE MEDICATION: Allergies as of 08/14/2023   No Known Allergies      Medication List     STOP taking these medications    castor oil  liquid   MILK THISTLE PO       TAKE these medications    acetaminophen  500 MG tablet Commonly known as: TYLENOL  Take 2 tablets (1,000 mg total) by mouth every 8 (eight) hours.   albuterol  108 (90 Base) MCG/ACT inhaler Commonly known as: VENTOLIN  HFA Inhale 2 puffs into the lungs every 4 (four) hours as needed for wheezing or shortness of breath. What changed: Another medication with the same name was added. Make sure you understand how and when to take each.   albuterol  (2.5 MG/3ML) 0.083% nebulizer solution Commonly known as: PROVENTIL  Take 3 mLs (2.5 mg total) by nebulization every 4 (four) hours as needed for wheezing or shortness of breath. What changed: You were already taking a medication with the same name, and this prescription was added. Make sure you understand how and when to take each.   amLODipine  5 MG tablet Commonly known as: NORVASC  Take 5 mg by mouth daily.   atorvastatin  10 MG tablet Commonly known as: LIPITOR Take 1 tablet (10 mg total) by mouth at bedtime.   benztropine  2 MG tablet Commonly known as: COGENTIN  Take 1 tablet (2 mg total) by mouth 2 (two) times daily.   Breztri Aerosphere 160-9-4.8 MCG/ACT Aero inhaler Generic drug: budesonide -glycopyrrolate -formoterol  Inhale 1-2 puffs into the lungs 2 (two) times daily.   budesonide  0.25 MG/2ML nebulizer solution Commonly known as: PULMICORT  Take 2 mLs (0.25 mg total) by  nebulization 2 (two) times daily.   carvedilol  3.125 MG tablet Commonly known as: COREG  Take 1 tablet (3.125 mg total) by mouth 2 (two) times daily with a meal.   cetirizine 10 MG tablet Commonly known as: ZYRTEC Take 10 mg by mouth daily as needed for allergies or rhinitis.   cholecalciferol 25 MCG (1000 UNIT) tablet Commonly known as: VITAMIN D3 Take 5,000 Units by mouth every morning.   dextromethorphan -guaiFENesin  30-600 MG 12hr tablet Commonly known as: MUCINEX  DM Take 1 tablet by mouth 2 (two) times daily for 10 days.    famotidine  40 MG tablet Commonly known as: PEPCID  Take 40 mg by mouth 2 (two) times daily.   feeding supplement (GLUCERNA SHAKE) Liqd Take 237 mLs by mouth 3 (three) times daily between meals.   ibuprofen 400 MG tablet Commonly known as: ADVIL Take 1 tablet (400 mg total) by mouth every 6 (six) hours as needed (moderate to severe pain).   ipratropium-albuterol  0.5-2.5 (3) MG/3ML Soln Commonly known as: DUONEB Take 3 mLs by nebulization 3 (three) times daily. What changed: when to take this   Januvia 100 MG tablet Generic drug: sitaGLIPtin Take 100 mg by mouth daily.   lidocaine  5 % Commonly known as: LIDODERM  Place 1 patch onto the skin daily. Remove & Discard patch within 12 hours or as directed by MD   melatonin 5 MG Tabs Take 1 tablet (5 mg total) by mouth at bedtime as needed.   methocarbamol 750 MG tablet Commonly known as: ROBAXIN Take 1 tablet (750 mg total) by mouth every 6 (six) hours as needed for muscle spasms.   mirtazapine  30 MG tablet Commonly known as: REMERON  Take 1 tablet (30 mg total) by mouth at bedtime as needed (insomnia). What changed: when to take this   multivitamin Tabs tablet Take 1 tablet by mouth daily.   OXYGEN  Inhale 2 L/min into the lungs continuous.   polyethylene glycol 17 g packet Commonly known as: MIRALAX  / GLYCOLAX  Take 17 g by mouth daily as needed for mild constipation.   potassium chloride  SA 20 MEQ tablet Commonly known as: KLOR-CON  M Take 1 tablet (20 mEq total) by mouth 2 (two) times daily.   predniSONE  10 MG tablet Commonly known as: DELTASONE  Take 3 tablets (30 mg total) by mouth daily with breakfast for 2 days, THEN 2 tablets (20 mg total) daily with breakfast for 2 days, THEN 1 tablet (10 mg total) daily with breakfast for 2 days. Start taking on: Aug 14, 2023   risperiDONE  3 MG tablet Commonly known as: RISPERDAL  Take 1 tablet (3 mg total) by mouth 2 (two) times daily.   sodium chloride  0.65 % Soln nasal  spray Commonly known as: OCEAN Place 1 spray into both nostrils as needed for congestion.   tamsulosin  0.4 MG Caps capsule Commonly known as: FLOMAX  Take 0.4 mg by mouth in the morning and at bedtime.   traMADol 50 MG tablet Commonly known as: ULTRAM Take 1 tablet (50 mg total) by mouth every 6 (six) hours as needed (pain not controlled with Tylenol , ibuprofen and Robaxin).               Durable Medical Equipment  (From admission, onward)           Start     Ordered   08/13/23 1334  For home use only DME 4 wheeled rolling walker with seat  Once       Question:  Patient needs a walker to treat with the  following condition  Answer:  Unsteady gait   08/13/23 1333            Follow-up Information     Jacqueline Matsu, MD. Schedule an appointment as soon as possible for a visit.   Specialty: Pulmonary Disease Why: Hospital follow up in 1-2 weeks Contact information: [address] [place] [address] [phone]         [facility] Follow up.   Why: DME rollator provider Contact information: [address] [place] [address] [phone]         [facility] Follow up.   Why: They will call you with first visit Contact information: [address] [facility] [place]  [phone]        [facility] Follow up.   Why: [facility] for home health PT.  They will call you with first visit. Contact information: [address] [place] [address] [phone]                Discharge Exam: Cleavon Curls Weights   08/09/23 2009  Weight: 74.8 kg   NOTE: physical exam below is based on Paramedic's in-person exam and my observations from virtual encounter.   General exam: awake, alert, no acute distress, in good spirits HEENT: moist mucus membranes, hearing grossly normal  Respiratory system: generally diminished breath sounds with no wheezes, rales or rhonchi, normal respiratory effort at rest on  baseline 2 L/min [place] O2. Cardiovascular system: RRR, no peripheral edema.   Gastrointestinal system: non-tender, mildly distended Central nervous system: A&O. no gross focal neurologic deficits, normal speech Extremities: moves all, no edema, normal tone Skin: dry, intact, no rashes seen on visualized skin Psychiatry: normal mood, congruent affect   Condition at discharge: stable  The results of significant diagnostics from this hospitalization (including imaging, microbiology, ancillary and laboratory) are listed below for reference.   Imaging Studies: DG Abd 1 View Result Date: 08/11/2023 CLINICAL DATA:  Small-bowel obstruction EXAM: ABDOMEN - 1 VIEW COMPARISON:  CT of yesterday FINDINGS: 2 supine views of the abdomen and pelvis. Mildly distended, gas-filled stomach. Cholecystectomy clips. No significant gaseous distension of bowel loops. No abnormal abdominal calcifications. No gross free intraperitoneal air. IMPRESSION: No evidence of bowel obstruction. Nonspecific gaseous distension of the stomach. Consider nasogastric tube placement. Electronically Signed   By: Lore Rode M.D.   On: 08/11/2023 18:34   CT ABDOMEN PELVIS WO CONTRAST Result Date: 08/10/2023 CLINICAL DATA:  Generalized abdominal pain. EXAM: CT ABDOMEN AND PELVIS WITHOUT CONTRAST TECHNIQUE: Multidetector CT imaging of the abdomen and pelvis was performed following the standard protocol without IV contrast. RADIATION DOSE REDUCTION: This exam was performed according to the departmental dose-optimization program which includes automated exposure control, adjustment of the mA and/or kV according to patient size and/or use of iterative reconstruction technique. COMPARISON:  04/15/2023 FINDINGS: Lower chest: No substantial change since chest CTA yesterday. Masslike opacity in the right lower lung is similar with advanced changes of emphysema noted in the lung bases bilaterally. Hepatobiliary: 10 mm hypodensity in the inferior right  liver is similar to prior study. Cholecystectomy. No intrahepatic or extrahepatic biliary dilation. Pancreas: No focal mass lesion. No dilatation of the main duct. No intraparenchymal cyst. No peripancreatic edema. Spleen: No splenomegaly. No suspicious focal mass lesion. Adrenals/Urinary Tract: No adrenal nodule or mass. Right kidney unremarkable. Stable small left renal cysts. Contrast excretion from the kidneys compatible with recent chest CTA in there may be a caliceal diverticulum in the lower pole left kidney.  No evidence for hydroureter. The urinary bladder appears normal for the degree of distention. Stomach/Bowel: Stomach is distended with gas. Duodenum is normally positioned as is the ligament of Treitz. No small bowel wall thickening. No small bowel dilatation. The terminal ileum is normal. The appendix is normal. No gross colonic mass. No colonic wall thickening. Vascular/Lymphatic: There is advanced atherosclerotic calcification of the abdominal aorta without aneurysm. There is no gastrohepatic or hepatoduodenal ligament lymphadenopathy. No retroperitoneal or mesenteric lymphadenopathy. No pelvic sidewall lymphadenopathy. Reproductive: Prostate gland appears mildly enlarged. Other: No intraperitoneal free fluid. Musculoskeletal: No worrisome lytic or sclerotic osseous abnormality. IMPRESSION: 1. No acute findings in the abdomen or pelvis. Specifically, no findings to explain the patient's history of abdominal pain. 2. 10 mm low-density lesion inferior right liver incompletely characterized on today's noncontrast CT. Previous CT recommended MRI follow-up. 3. Advanced emphysema in the lung bases with consolidative masslike opacity at the right base. See report for chest CTA yesterday. 4. Aortic Atherosclerosis (ICD10-I70.0) and Emphysema (ICD10-J43.9). Electronically Signed   By: Donnal Fusi M.D.   On: 08/10/2023 07:43   DG Abd 1 View Result Date: 08/10/2023 CLINICAL DATA:  Abdominal distension EXAM:  ABDOMEN - 1 VIEW COMPARISON:  CT abdomen pelvis 04/15/2023 FINDINGS: Gaseous dilation of small bowel in the central abdomen. This may be due to ileus or obstruction. Cholecystectomy. Contrast in the bladder. No acute osseous abnormality. IMPRESSION: Gaseous dilation of small bowel in the central abdomen may be due to ileus or obstruction. Consider CT for further evaluation if there is concern for obstruction. Electronically Signed   By: Rozell Cornet M.D.   On: 08/10/2023 01:26   CT Angio Chest PE W and/or Wo Contrast Result Date: 08/09/2023 CLINICAL DATA:  High probability pulmonary embolism. Shortness of breath. History of lung cancer, hypoxia, tachycardia. EXAM: CT ANGIOGRAPHY CHEST WITH CONTRAST TECHNIQUE: Multidetector CT imaging of the chest was performed using the standard protocol during bolus administration of intravenous contrast. Multiplanar CT image reconstructions and MIPs were obtained to evaluate the vascular anatomy. RADIATION DOSE REDUCTION: This exam was performed according to the departmental dose-optimization program which includes automated exposure control, adjustment of the mA and/or kV according to patient size and/or use of iterative reconstruction technique. CONTRAST:  75mL OMNIPAQUE  IOHEXOL  350 MG/ML SOLN COMPARISON:  Chest radiograph 08/09/2023 and CT chest 05/19/2023 FINDINGS: Cardiovascular: Normal heart size. No pericardial effusion. Negative for acute pulmonary embolism. Normal caliber thoracic aorta. No evidence of aortic dissection. Coronary artery and aortic atherosclerotic calcification. Mediastinum/Nodes: Debris in the posterior trachea. Unremarkable esophagus. Unchanged 1.1 cm right hilar node on series 7/image 193 and 2.5 cm subcarinal confluent node on 7/209. Lungs/Pleura: Advanced emphysema with bullous change. Upper Abdomen: No acute abnormality. Partially calcified superior segment right lower lobe nodule is unchanged measuring 2.0 by 1.3 cm (series 6/image 93).  Similar right lower lobe posterior basal mass measuring 4.9 x 7.0 cm (series 6/image 102). Similar bronchial wall thickening and mucous plugging and the bilateral lower lobes and right middle lobe bronchi. Probable small right pleural effusion. No pneumothorax. Musculoskeletal: Since 05/19/2023 there is a new compression fracture of T7 with 50% vertebral body height loss centrally. No retropulsion. No evidence of underlying metastatic lesion. Review of the MIP images confirms the above findings. IMPRESSION: 1. Negative for acute pulmonary embolism. 2. Similar right lower lobe posterior basal mass, right hilar and mediastinal lymphadenopathy compared to 05/19/2023. 3. Similar bronchial wall thickening and mucous plugging in the bilateral lower lobes and right middle lobe bronchi. 4. Small  right pleural effusion. 5. Since 05/19/2023 there is a new compression fracture of T7 with 50% vertebral body height loss centrally. No retropulsion. No evidence of underlying metastatic lesion. Aortic Atherosclerosis (ICD10-I70.0) and Emphysema (ICD10-J43.9). Electronically Signed   By: Rozell Cornet M.D.   On: 08/09/2023 22:28   DG Chest Portable 1 View Result Date: 08/09/2023 CLINICAL DATA:  Shortness of breath. EXAM: PORTABLE CHEST 1 VIEW COMPARISON:  October 09, 2022 FINDINGS: The heart size and mediastinal contours are within normal limits. There is evidence of extensive emphysematous lung disease. A mild component of superimposed atelectasis and/or infiltrate is noted within the mid left lung. There is moderate severity right basilar scarring, atelectasis and/or infiltrate. A component of the patient's known right basilar lung mass is also noted. Mild left basilar linear scarring and/or atelectasis is seen. There is a small right pleural effusion. No pneumothorax is identified. Radiopaque surgical clips are seen within the right upper quadrant. The visualized skeletal structures are unremarkable. IMPRESSION: 1. Extensive  emphysematous lung disease with a mild component of superimposed atelectasis and/or infiltrate within the mid left lung. 2. Moderate severity right basilar scarring, atelectasis and/or infiltrate with a component of the patient's known right basilar lung mass. 3. Small right pleural effusion. Electronically Signed   By: Virgle Grime M.D.   On: 08/09/2023 20:50    Microbiology: Results for orders placed or performed during the hospital encounter of 08/09/23  Blood culture (routine x 2)     Status: None   Collection Time: 08/09/23  8:47 PM   Specimen: BLOOD  Result Value Ref Range Status   Specimen Description BLOOD SITE NOT SPECIFIED  Final   Special Requests   Final    BOTTLES DRAWN AEROBIC AND ANAEROBIC Blood Culture adequate volume   Culture   Final    NO GROWTH 5 DAYS Performed at [facility], [address], [place], [place] [address]    Report Status 08/14/2023 FINAL  Final  Resp panel by RT-PCR (RSV, Flu A&B, Covid) Peripheral     Status: None   Collection Time: 08/09/23  8:47 PM   Specimen: Peripheral; Nasal Swab  Result Value Ref Range Status   SARS Coronavirus 2 by RT PCR NEGATIVE NEGATIVE Final   Influenza A by PCR NEGATIVE NEGATIVE Final   Influenza B by PCR NEGATIVE NEGATIVE Final    Comment: (NOTE) The Xpert Xpress SARS-CoV-2/FLU/RSV plus assay is intended as an aid in the diagnosis of influenza from Nasopharyngeal swab specimens and should not be used as a sole basis for treatment. Nasal washings and aspirates are unacceptable for Xpert Xpress SARS-CoV-2/FLU/RSV testing.  Fact Sheet for Patients: BloggerCourse.com  Fact Sheet for Healthcare Providers: SeriousBroker.it  This test is not yet approved or cleared by the [place]  FDA and has been authorized for detection and/or diagnosis of SARS-CoV-2 by FDA under an Emergency Use Authorization (EUA). This EUA will remain in effect (meaning this test  can be used) for the duration of the COVID-19 declaration under Section 564(b)(1) of the Act, 21 U.S.C. section 360bbb-3(b)(1), unless the authorization is terminated or revoked.     Resp Syncytial Virus by PCR NEGATIVE NEGATIVE Final    Comment: (NOTE) Fact Sheet for Patients: BloggerCourse.com  Fact Sheet for Healthcare Providers: SeriousBroker.it  This test is not yet approved or cleared by the [place]  FDA and has been authorized for detection and/or diagnosis of SARS-CoV-2 by FDA under an Emergency Use Authorization (EUA). This EUA will remain in effect (meaning  this test can be used) for the duration of the COVID-19 declaration under Section 564(b)(1) of the Act, 21 U.S.C. section 360bbb-3(b)(1), unless the authorization is terminated or revoked.  Performed at [facility], [address], [place], [place] [address]   Blood culture (routine x 2)     Status: None   Collection Time: 08/09/23  8:49 PM   Specimen: BLOOD LEFT ARM  Result Value Ref Range Status   Specimen Description BLOOD LEFT ARM  Final   Special Requests   Final    BOTTLES DRAWN AEROBIC AND ANAEROBIC Blood Culture results may not be optimal due to an inadequate volume of blood received in culture bottles   Culture   Final    NO GROWTH 5 DAYS Performed at [facility], [address], [place], [place] [address]    Report Status 08/14/2023 FINAL  Final    Labs: CBC: Recent Labs  Lab 08/09/23 2047 08/10/23 0229 08/12/23 0816 08/13/23 1206  WBC 15.0* 12.7* 12.8* 15.3*  NEUTROABS 12.6*  --   --   --   HGB 12.8* 12.2* 11.7* 11.9*  HCT 39.8 38.3* 35.5* 35.6*  MCV 83.6 83.6 83.1 83.6  PLT 306 304 293 308   Basic Metabolic Panel: Recent Labs  Lab 08/09/23 2047 08/12/23 0816 08/13/23 1206  NA 132* 134* 132*  K 4.0 4.0 3.3*  CL 93* 99 94*  CO2 25 23 27   GLUCOSE 155* 126* 124*  BUN 9 11 17   CREATININE 0.61 0.54* 0.56*  CALCIUM   10.2 8.6* 8.8*   Liver Function Tests: Recent Labs  Lab 08/10/23 0229 08/12/23 0816 08/13/23 1206  AST 25 25 27   ALT 22 33 40  ALKPHOS 54 49 49  BILITOT 0.5 0.3 0.2  PROT 7.0 6.4* 6.4*  ALBUMIN 2.8* 2.5* 2.6*   CBG: Recent Labs  Lab 08/10/23 2131 08/11/23 0736 08/11/23 1211 08/11/23 1634 08/12/23 1112  GLUCAP 142* 168* 102* 120* 201*    Discharge time spent: greater than 30 minutes.  Signed: Montey Apa, DO [facility] 08/14/2023

## 2023-08-14 NOTE — Progress Notes (Signed)
 [facility] at Home- [facility] Liaison Note:  Notified by [facility] manager of patient/family request for [facility]iative services at home after discharge.   Referral information obtained and our Outpatient Palliative Care team will contact patient/family after discharge to schedule a visit.   Please call with any hospice or outpatient palliative care related questions.   Thank you for the opportunity to participate in this patient's care.   Madelene Schanz, BSN, RN, OCN [employer] [phone]

## 2023-08-14 NOTE — Progress Notes (Addendum)
 0820 this am Sister Annabell Baron called states pt is hurting and requesting to given pain medication ( see MAR) [facility] nurse able to assist via video chat to give meds.

## 2023-08-14 NOTE — Plan of Care (Signed)
 Verbal and return teach back on education with Sister ( caretaker)

## 2023-08-15 ENCOUNTER — Telehealth: Payer: Self-pay

## 2023-08-15 NOTE — Patient Instructions (Signed)
 Visit Information  Thank you for taking time to visit with me today. Please don't hesitate to contact me if I can be of assistance to you before our next scheduled telephone appointment.  Our next appointment is by telephone on Wednesday June 4th at 1:00pm  Following is a copy of your care plan:   Goals Addressed             This Visit's Progress    VBCI Transitions of Care (TOC) Care Plan       Problems:  Recent Hospitalization for treatment of COPD and Low Back Pain  Goal:  Over the next 30 days, the patient will not experience hospital readmission  Interventions:   COPD Interventions: Advised patient to engage in light exercise as tolerated 3-5 days a week to aid in the the management of COPD Advised patient to track and manage COPD triggers Assessed social determinant of health barriers Discussed the importance of adequate rest and management of fatigue with COPD Provided education about and advised patient to utilize infection prevention strategies to reduce risk of respiratory infection Provided instruction about proper use of medications used for management of COPD including inhalers Use of home oxygen    Pain Interventions: Pain assessment performed Medications reviewed Reviewed provider established plan for pain management Counseled on the importance of reporting any/all new or changed pain symptoms or management strategies to pain management provider Advised patient to report to care team affect of pain on daily activities Discussed use of relaxation techniques and/or diversional activities to assist with pain reduction (distraction, imagery, relaxation, massage, acupressure, TENS, heat, and cold application Reviewed with patient prescribed pharmacological and nonpharmacological pain relief strategies  Patient Self Care Activities:  Attend all scheduled provider appointments Call pharmacy for medication refills 3-7 days in advance of running out of  medications Call provider office for new concerns or questions  Notify RN Care Manager of TOC call rescheduling needs Participate in Transition of Care Program/Attend TOC scheduled calls Take medications as prescribed   eliminate smoking in my home identify and remove indoor air pollutants limit outdoor activity during cold weather do breathing exercises every day arrange respite care for caregiver eliminate symptom triggers at home use devices that will help like a cane, sock-puller or reacher do breathing exercises every day  Plan:  Telephone follow up appointment with care management team member scheduled for:  Wednesday June 4 at 1:00pm        The patient verbalized understanding of instructions, educational materials, and care plan provided today and agreed to receive a mailed copy of patient instructions, educational materials, and care plan.   The patient has been provided with contact information for the care management team and has been advised to call with any health related questions or concerns.   Please call the care guide team at [phone] if you need to cancel or reschedule your appointment.   Please call the Suicide and Crisis Lifeline: 988 call the [place]  National Suicide Prevention Lifeline: 906-428-2243 or TTY: (458) 430-4168 TTY 925-199-9565) to talk to a trained counselor if you are experiencing a Mental Health or Behavioral Health Crisis or need someone to talk to.  Gareld June, BSN, RN [place]  VBCI - [employer] [facility] RN Care Manager [phone]

## 2023-08-15 NOTE — Transitions of Care (Post Inpatient/ED Visit) (Signed)
 08/15/2023  Name: Blake Mcknight MRN: [id number] DOB: 13-Jul-1955  Today's TOC FU Call Status: Today's TOC FU Call Status:: Successful TOC FU Call Completed TOC FU Call Complete Date: 08/15/23 Patient's Name and Date of Birth confirmed.  Transition Care Management Follow-up Telephone Call Date of Discharge: 08/14/23 Discharge Facility: Other ([facility]) Name of Other ([facility] Facility[facility] Type of Discharge: Inpatient Admission Primary Inpatient Discharge Diagnosis:: COPD Exacerbation How have you been since you were released from the hospital?: Better Any questions or concerns?: Yes Patient Questions/Concerns:: Medications for pain Patient Questions/Concerns Addressed: Other: (Medications are at the pharmacy)  Items Reviewed: Did you receive and understand the discharge instructions provided?: Yes Medications obtained,verified, and reconciled?: Yes (Medications Reviewed) Any new allergies since your discharge?: No Dietary orders reviewed?: Yes Type of Diet Ordered:: Regular Do you have support at home?: Yes People in Home [RPT]: sibling(s) Name of Support/Comfort Primary Source: [name], sister  Medications Reviewed Today: Medications Reviewed Today     Reviewed by Claudene Crystal, RN (Case Manager) on 08/15/23 at 1219  Med List Status: <None>   Medication Order Taking? Sig Documenting Provider Last Dose Status Informant  acetaminophen  (TYLENOL ) 500 MG tablet [id number]  Take 2 tablets (1,000 mg total) by mouth every 8 (eight) hours. Darus Engels A, DO  Active   albuterol  (PROVENTIL ) (2.5 MG/3ML) 0.083% nebulizer solution [id number]  Take 3 mLs (2.5 mg total) by nebulization every 4 (four) hours as needed for wheezing or shortness of breath. Montey Apa, DO  Active   albuterol  (VENTOLIN  HFA) 108 (90 Base) MCG/ACT inhaler [id number] No Inhale 2 puffs into the lungs every 4 (four) hours as needed for wheezing or shortness of breath.  Guadalupe Lee, MD 08/09/2023 Evening Active Family Member  amLODipine  (NORVASC ) 5 MG tablet [id number] No Take 5 mg by mouth daily. [provider] 08/09/2023 Morning Active Family Member  atorvastatin  (LIPITOR) 10 MG tablet [id number]  Take 1 tablet (10 mg total) by mouth at bedtime. Darus Engels A, DO  Active   benztropine  (COGENTIN ) 2 MG tablet [id number] No Take 1 tablet (2 mg total) by mouth 2 (two) times daily. Arfeen, Syed T, MD 08/09/2023 Morning Active Family Member  budesonide  (PULMICORT ) 0.25 MG/2ML nebulizer solution [id number]  Take 2 mLs (0.25 mg total) by nebulization 2 (two) times daily. Darus Engels A, DO  Active   budesonide -glycopyrrolate -formoterol  (BREZTRI AEROSPHERE) 160-9-4.8 MCG/ACT AERO inhaler [id number] No Inhale 1-2 puffs into the lungs 2 (two) times daily. [provider] 08/09/2023 Morning Active Family Member  carvedilol  (COREG ) 3.125 MG tablet [id number] No Take 1 tablet (3.125 mg total) by mouth 2 (two) times daily with a meal. Audria Leather, MD 08/09/2023 Morning Active Family Member  cetirizine (ZYRTEC) 10 MG tablet [id number] No Take 10 mg by mouth daily as needed for allergies or rhinitis. [provider] Unknown Active Family Member  cholecalciferol (VITAMIN D) 25 MCG (1000 UNIT) tablet [id number] No Take 5,000 Units by mouth every morning. [provider] 08/08/2023 Morning Active Family Member  dextromethorphan -guaiFENesin  (MUCINEX  DM) 30-600 MG 12hr tablet [id number]  Take 1 tablet by mouth 2 (two) times daily for 10 days. Darus Engels A, DO  Active   famotidine  (PEPCID ) 40 MG tablet [id number] No Take 40 mg by mouth 2 (two) times daily. [provider] 08/09/2023 Morning Active Family Member  feeding supplement, GLUCERNA SHAKE, (GLUCERNA SHAKE) LIQD [id number]  Take 237 mLs by mouth 3 (three) times daily between meals. Montey Apa, DO  Active  ibuprofen  (ADVIL ) 400 MG tablet [id number]  Take 1 tablet (400 mg total) by  mouth every 6 (six) hours as needed (moderate to severe pain). Darus Engels A, DO  Active   ipratropium-albuterol  (DUONEB) 0.5-2.5 (3) MG/3ML SOLN [id number]  Take 3 mLs by nebulization every 4 (four) hours. Montey Apa, DO  Active   JANUVIA 100 MG tablet [id number] No Take 100 mg by mouth daily. [provider] 08/09/2023 Morning Active Family Member           Med Note (WHITE, Anthony Kirk Aug 09, 2023 11:35 PM)    lidocaine  (LIDODERM ) 5 % [id number]  Place 1 patch onto the skin daily. Remove & Discard patch within 12 hours or as directed by MD Montey Apa, DO  Active   melatonin 5 MG TABS [id number]  Take 1 tablet (5 mg total) by mouth at bedtime as needed. Darus Engels A, DO  Active   methocarbamol  (ROBAXIN ) 750 MG tablet [id number]  Take 1 tablet (750 mg total) by mouth every 6 (six) hours as needed for muscle spasms. Darus Engels A, DO  Active   mirtazapine  (REMERON ) 30 MG tablet [id number] No Take 1 tablet (30 mg total) by mouth at bedtime as needed (insomnia).  Patient taking differently: Take 30 mg by mouth at bedtime.   Arturo Late, MD 08/08/2023 Bedtime Active Family Member  multivitamin (ONE-A-DAY MEN'S) TABS tablet [id number] No Take 1 tablet by mouth daily.  Patient not taking: Reported on 08/09/2023   [provider] Not Taking Active Family Member           Med Note (WHITE, Davina Ester Apr 16, 2023  3:18 AM) Taken occasionally   OXYGEN  [id number] No Inhale 2 L/min into the lungs continuous.  [provider] Taking Active Family Member  polyethylene glycol (MIRALAX  / GLYCOLAX ) 17 g packet [id number] No Take 17 g by mouth daily as needed for mild constipation. [provider] 08/09/2023 Morning Active Family Member  potassium chloride  SA (KLOR-CON  M) 20 MEQ tablet [id number]  Take 1 tablet (20 mEq total) by mouth 2 (two) times daily. Montey Apa, DO  Active   predniSONE  (DELTASONE ) 10 MG tablet [id number]  Take 3 tablets (30 mg  total) by mouth daily with breakfast for 2 days, THEN 2 tablets (20 mg total) daily with breakfast for 2 days, THEN 1 tablet (10 mg total) daily with breakfast for 2 days. Darus Engels A, DO  Active   risperiDONE  (RISPERDAL ) 3 MG tablet [id number] No Take 1 tablet (3 mg total) by mouth 2 (two) times daily. Arfeen, Syed T, MD 08/09/2023 Morning Active Family Member  sodium chloride  (OCEAN) 0.65 % SOLN nasal spray [id number] No Place 1 spray into both nostrils as needed for congestion. [provider] Past Week Active Family Member  tamsulosin  (FLOMAX ) 0.4 MG CAPS capsule [id number] No Take 0.4 mg by mouth in the morning and at bedtime. [provider] 08/09/2023 Morning Active Family Member  traMADol  (ULTRAM ) 50 MG tablet [id number]  Take 1 tablet (50 mg total) by mouth every 6 (six) hours as needed (pain not controlled with Tylenol , ibuprofen  and Robaxin ). Montey Apa, DO  Active             Home Care and Equipment/Supplies: Were Home Health Services Ordered?: Yes Name of Home Health Agency:: [facility] Has Agency set up a time to come to your home?: No (HHPT contacted the patient's sister and she asked not  to be contacted until Monday to set up the first visit) EMR reviewed for Home Health Orders: Orders present/patient has not received call (refer to CM for follow-up) Any new equipment or medical supplies ordered?: Yes Name of Medical supply agency?: [facility] Were you able to get the equipment/medical supplies?: Yes Do you have any questions related to the use of the equipment/supplies?: No  Functional Questionnaire: Do you need assistance with bathing/showering or dressing?: Yes (His sister provides assistance) Do you need assistance with meal preparation?: Yes (His sister provides assistance) Do you need assistance with eating?: No Do you have difficulty maintaining continence: No Do you need assistance with getting out of bed/getting out of a chair/moving?: No Do you  have difficulty managing or taking your medications?: Yes (His sister provides assistance)  Follow up appointments reviewed: PCP Follow-up appointment confirmed?: Yes Date of PCP follow-up appointment?: 08/17/23 Follow-up Provider: Jacqueline Matsu Specialist [facility] Follow-up appointment confirmed?: Yes Date of Specialist follow-up appointment?: 10/21/23 Follow-Up Specialty Provider:: Dr. Baldwin Levee Do you need transportation to your follow-up appointment?: No Do you understand care options if your condition(s) worsen?: Yes-patient verbalized understanding  SDOH Interventions Today    Flowsheet Row Most Recent Value  SDOH Interventions   Food Insecurity Interventions Intervention Not Indicated  Housing Interventions Intervention Not Indicated  Transportation Interventions Intervention Not Indicated  Utilities Interventions Intervention Not Indicated       Goals       (per sister) Remain out of hospital (pt-stated)      Care Coordination Interventions: Advised patient to track and manage COPD triggers Advised patient to self assesses COPD action plan zone and make appointment with provider if in the yellow zone for 48 hours without improvement Provided education about and advised patient to utilize infection prevention strategies to reduce risk of respiratory infection   8/17 - Has remained stable, no hospitalizations since June.  Per sister, does not have any current shortness of breath      VBCI Transitions of Care (TOC) Care Plan      Problems:  Recent Hospitalization for treatment of COPD and Low Back Pain  Goal:  Over the next 30 days, the patient will not experience hospital readmission  Interventions:   COPD Interventions: Advised patient to engage in light exercise as tolerated 3-5 days a week to aid in the the management of COPD Advised patient to track and manage COPD triggers Assessed social determinant of health barriers Discussed the importance of adequate  rest and management of fatigue with COPD Provided education about and advised patient to utilize infection prevention strategies to reduce risk of respiratory infection Provided instruction about proper use of medications used for management of COPD including inhalers Use of home oxygen    Pain Interventions: Pain assessment performed Medications reviewed Reviewed provider established plan for pain management Counseled on the importance of reporting any/all new or changed pain symptoms or management strategies to pain management provider Advised patient to report to care team affect of pain on daily activities Discussed use of relaxation techniques and/or diversional activities to assist with pain reduction (distraction, imagery, relaxation, massage, acupressure, TENS, heat, and cold application Reviewed with patient prescribed pharmacological and nonpharmacological pain relief strategies  Patient Self Care Activities:  Attend all scheduled provider appointments Call pharmacy for medication refills 3-7 days in advance of running out of medications Call provider office for new concerns or questions  Notify RN Care Manager of TOC call rescheduling needs Participate in Transition of Care Program/Attend TOC scheduled calls Take  medications as prescribed   eliminate smoking in my home identify and remove indoor air pollutants limit outdoor activity during cold weather do breathing exercises every day arrange respite care for caregiver eliminate symptom triggers at home use devices that will help like a cane, sock-puller or reacher do breathing exercises every day  Plan:  Telephone follow up appointment with care management team member scheduled for:  Wednesday June 4 at 1:00pm       Gareld June, BSN, RN [place]  VBCI - [employer] [facility] RN Care Manager [phone]

## 2023-08-17 DIAGNOSIS — M2550 Pain in unspecified joint: Secondary | ICD-10-CM | POA: Diagnosis not present

## 2023-08-17 DIAGNOSIS — E7800 Pure hypercholesterolemia, unspecified: Secondary | ICD-10-CM | POA: Diagnosis not present

## 2023-08-17 DIAGNOSIS — K5900 Constipation, unspecified: Secondary | ICD-10-CM | POA: Diagnosis not present

## 2023-08-17 DIAGNOSIS — F319 Bipolar disorder, unspecified: Secondary | ICD-10-CM | POA: Diagnosis not present

## 2023-08-17 DIAGNOSIS — Z79899 Other long term (current) drug therapy: Secondary | ICD-10-CM | POA: Diagnosis not present

## 2023-08-17 DIAGNOSIS — E559 Vitamin D deficiency, unspecified: Secondary | ICD-10-CM | POA: Diagnosis not present

## 2023-08-17 DIAGNOSIS — E1165 Type 2 diabetes mellitus with hyperglycemia: Secondary | ICD-10-CM | POA: Diagnosis not present

## 2023-08-17 DIAGNOSIS — K2100 Gastro-esophageal reflux disease with esophagitis, without bleeding: Secondary | ICD-10-CM | POA: Diagnosis not present

## 2023-08-17 DIAGNOSIS — M1990 Unspecified osteoarthritis, unspecified site: Secondary | ICD-10-CM | POA: Diagnosis not present

## 2023-08-17 DIAGNOSIS — I119 Hypertensive heart disease without heart failure: Secondary | ICD-10-CM | POA: Diagnosis not present

## 2023-08-17 DIAGNOSIS — N401 Enlarged prostate with lower urinary tract symptoms: Secondary | ICD-10-CM | POA: Diagnosis not present

## 2023-08-17 DIAGNOSIS — J441 Chronic obstructive pulmonary disease with (acute) exacerbation: Secondary | ICD-10-CM | POA: Diagnosis not present

## 2023-08-18 ENCOUNTER — Ambulatory Visit
Admission: RE | Admit: 2023-08-18 | Discharge: 2023-08-18 | Disposition: A | Discharge status: Home / Self Care | Source: Ambulatory Visit | Attending: Emergency Medicine | Admitting: Emergency Medicine

## 2023-08-18 DIAGNOSIS — R9389 Abnormal findings on diagnostic imaging of other specified body structures: Secondary | ICD-10-CM

## 2023-08-18 DIAGNOSIS — R918 Other nonspecific abnormal finding of lung field: Secondary | ICD-10-CM | POA: Diagnosis not present

## 2023-08-18 DIAGNOSIS — J438 Other emphysema: Secondary | ICD-10-CM | POA: Diagnosis not present

## 2023-08-18 DIAGNOSIS — I3139 Other pericardial effusion (noninflammatory): Secondary | ICD-10-CM | POA: Diagnosis not present

## 2023-08-18 DIAGNOSIS — R590 Localized enlarged lymph nodes: Secondary | ICD-10-CM | POA: Diagnosis not present

## 2023-08-19 ENCOUNTER — Other Ambulatory Visit: Payer: Self-pay

## 2023-08-19 ENCOUNTER — Telehealth: Payer: Self-pay

## 2023-08-19 NOTE — Transitions of Care (Post Inpatient/ED Visit) (Signed)
 Transition of Care week 2  Visit Note  08/19/2023  Name: Blake Mcknight MRN: [id number]          DOB: September 20, 1955  Situation: Patient enrolled in [facility] 30-day program. Visit completed with GwenWilliamson by telephone.   Background:     Past Medical History:  Diagnosis Date   Abdominal pain 06/03/2013   Chest pain 08/10/2023   COPD (chronic obstructive pulmonary disease) (HCC)    with bullous emphysema.    COPD exacerbation (HCC) 08/09/2023   Heavy cigarette smoker before 2003   pt claims only 10 cigs per day, never heavier amounts.    HLD (hyperlipidemia)    Hypertension    Schizophrenia (HCC) 06/28/2013   This is a chronic condition and he lives with family    Assessment: Patient Reported Symptoms: Cognitive Cognitive Status: Alert and oriented to person, place, and time, Difficulties with attention and concentration, Poor judgment in daily scenarios Cognitive/Intellectual Conditions Management [RPT]: Behavior Disorders Behavior Disorders: Schizophrenia   Health Maintenance Behaviors: Spiritual practice(s) Healing Pattern: Average Health Facilitated by: Pain control, Rest  Neurological Neurological Review of Symptoms: No symptoms reported    HEENT HEENT Symptoms Reported: No symptoms reported      Cardiovascular Cardiovascular Symptoms Reported: No symptoms reported Does patient have uncontrolled Hypertension?: No Weight: 165 lb (74.8 kg) Cardiovascular Self-Management Outcome: 4 (good)  Respiratory Respiratory Symptoms Reported: Wheezing, Shortness of breath Additional Respiratory Details: the patient is on continuous oxygen  and uses the nebulizer treatment Respiratory Conditions: COPD, Shortness of breath Respiratory Self-Management Outcome: 3 (uncertain) Respiratory Comment: Chronic COPD. He is a candidate for Palliative care  Endocrine Patient reports the following symptoms related to hypoglycemia or hyperglycemia : No symptoms reported Is patient diabetic?:  No    Gastrointestinal Gastrointestinal Symptoms Reported: Constipation Gastrointestinal Conditions: Constipation Gastrointestinal Management Strategies: Medication therapy, Activity, Fluid modification Gastrointestinal Self-Management Outcome: 4 (good) Gastrointestinal Comment: The patient drinks mostly water Nutrition Risk Screen (CP): Reduced oral intake over the last month  Genitourinary Genitourinary Symptoms Reported: No symptoms reported Genitourinary Self-Management Outcome: 4 (good)  Integumentary Integumentary Symptoms Reported: No symptoms reported    Musculoskeletal Musculoskelatal Symptoms Reviewed: Muscle pain, Unsteady gait Additional Musculoskeletal Details: Compression fracture to Thoracic spine Musculoskeletal Conditions: Back pain, Fracture, Unsteady gait Musculoskeletal Management Strategies: Activity, Medical device, Medication therapy, Coping strategies Musculoskeletal Self-Management Outcome: 3 (uncertain) Musculoskeletal Comment: His sister reports giving him Tylenol  and Tramadol  consistently. She does not have the Lidocaine  patch. She states he has reported pain but she is always giving him something Falls in the past year?: No Number of falls in past year: 1 or less Was there an injury with Fall?: No Fall Risk Category Calculator: 0 Patient Fall Risk Level: Low Fall Risk Patient at Risk for Falls Due to: Mental status change, Medication side effect Fall risk Follow up: Falls evaluation completed  Psychosocial Psychosocial Symptoms Reported: Psychosis, Phobias Additional Psychological Details: The patient has Schizophrenia and does hear voices Behavioral Health Conditions: Phobia(s), Psychosis Behavioral Management Strategies: Coping strategies, Medication therapy, Support system Behavioral Health Self-Management Outcome: 4 (good) Behavioral Health Comment: The patient has good days and bad days with his mental health status   Do you feel physically threatened  by others?: No   There were no vitals filed for this visit.  Medications Reviewed Today     Reviewed by Claudene Crystal, RN (Case Manager) on 08/19/23 at 1326  Med List Status: <None>   Medication Order Taking? Sig Documenting Provider Last Dose Status Informant  acetaminophen  (  TYLENOL ) 500 MG tablet [id number]  Take 2 tablets (1,000 mg total) by mouth every 8 (eight) hours. Darus Engels A, DO  Active   albuterol  (PROVENTIL ) (2.5 MG/3ML) 0.083% nebulizer solution [id number]  Take 3 mLs (2.5 mg total) by nebulization every 4 (four) hours as needed for wheezing or shortness of breath. Montey Apa, DO  Active   albuterol  (VENTOLIN  HFA) 108 (90 Base) MCG/ACT inhaler [id number]  Inhale 2 puffs into the lungs every 4 (four) hours as needed for wheezing or shortness of breath. Guadalupe Lee, MD  Active Family Member  amLODipine  (NORVASC ) 5 MG tablet [id number]  Take 5 mg by mouth daily. [provider]  Active Family Member  atorvastatin  (LIPITOR) 10 MG tablet [id number]  Take 1 tablet (10 mg total) by mouth at bedtime. Montey Apa, DO  Active   benztropine  (COGENTIN ) 2 MG tablet [id number]  Take 1 tablet (2 mg total) by mouth 2 (two) times daily. Arfeen, Syed T, MD  Active Family Member  budesonide  (PULMICORT ) 0.25 MG/2ML nebulizer solution [id number]  Take 2 mLs (0.25 mg total) by nebulization 2 (two) times daily. Darus Engels A, DO  Active   budesonide -glycopyrrolate -formoterol  (BREZTRI AEROSPHERE) 160-9-4.8 MCG/ACT AERO inhaler [id number]  Inhale 1-2 puffs into the lungs 2 (two) times daily. [provider]  Active Family Member  carvedilol  (COREG ) 3.125 MG tablet [id number]  Take 1 tablet (3.125 mg total) by mouth 2 (two) times daily with a meal. Audria Leather, MD  Active Family Member  cetirizine (ZYRTEC) 10 MG tablet [id number]  Take 10 mg by mouth daily as needed for allergies or rhinitis. [provider]  Active Family Member  cholecalciferol (VITAMIN D) 25  MCG (1000 UNIT) tablet [id number]  Take 5,000 Units by mouth every morning. [provider]  Active Family Member  dextromethorphan -guaiFENesin  (MUCINEX  DM) 30-600 MG 12hr tablet [id number]  Take 1 tablet by mouth 2 (two) times daily for 10 days. Darus Engels A, DO  Active   famotidine  (PEPCID ) 40 MG tablet [id number]  Take 40 mg by mouth 2 (two) times daily. [provider]  Active Family Member  feeding supplement, GLUCERNA SHAKE, (GLUCERNA SHAKE) LIQD [id number]  Take 237 mLs by mouth 3 (three) times daily between meals. Montey Apa, DO  Active   ibuprofen  (ADVIL ) 400 MG tablet [id number]  Take 1 tablet (400 mg total) by mouth every 6 (six) hours as needed (moderate to severe pain). Darus Engels A, DO  Active   ipratropium-albuterol  (DUONEB) 0.5-2.5 (3) MG/3ML SOLN [id number]  Take 3 mLs by nebulization every 4 (four) hours. Montey Apa, DO  Active   JANUVIA 100 MG tablet [id number]  Take 100 mg by mouth daily. [provider]  Active Family Member           Med Note (WHITE, Anthony Kirk Aug 09, 2023 11:35 PM)    lidocaine  (LIDODERM ) 5 % [id number] No Place 1 patch onto the skin daily. Remove & Discard patch within 12 hours or as directed by MD  Patient not taking: Reported on 08/19/2023   Montey Apa, DO Not Taking Active   melatonin 5 MG TABS [id number]  Take 1 tablet (5 mg total) by mouth at bedtime as needed. Montey Apa, DO  Active   methocarbamol  (ROBAXIN ) 750 MG tablet [id number]  Take 1 tablet (750 mg total) by mouth every 6 (six) hours as needed for muscle spasms. Darus Engels A, DO  Active   mirtazapine  (REMERON ) 30 MG  tablet [id number]  Take 1 tablet (30 mg total) by mouth at bedtime as needed (insomnia).  Patient taking differently: Take 30 mg by mouth at bedtime.   Arfeen, Syed T, MD  Active Family Member  multivitamin (ONE-A-DAY MEN'S) TABS tablet [id number]  Take 1 tablet by mouth daily.  Patient not taking: Reported on 08/09/2023    [provider]  Active Family Member           Med Note [facility], Davina Ester Apr 16, 2023  3:18 AM) Taken occasionally   OXYGEN  [id number]  Inhale 2 L/min into the lungs continuous.  [provider]  Active Family Member  polyethylene glycol (MIRALAX  / GLYCOLAX ) 17 g packet [id number]  Take 17 g by mouth daily as needed for mild constipation. [provider]  Active Family Member  potassium chloride  SA (KLOR-CON  M) 20 MEQ tablet [id number]  Take 1 tablet (20 mEq total) by mouth 2 (two) times daily. Darus Engels A, DO  Active   predniSONE  (DELTASONE ) 10 MG tablet [id number]  Take 3 tablets (30 mg total) by mouth daily with breakfast for 2 days, THEN 2 tablets (20 mg total) daily with breakfast for 2 days, THEN 1 tablet (10 mg total) daily with breakfast for 2 days. Darus Engels A, DO  Active   risperiDONE  (RISPERDAL ) 3 MG tablet [id number]  Take 1 tablet (3 mg total) by mouth 2 (two) times daily. Arfeen, Syed T, MD  Active Family Member  sodium chloride  (OCEAN) 0.65 % SOLN nasal spray [id number]  Place 1 spray into both nostrils as needed for congestion. [provider]  Active Family Member  tamsulosin  (FLOMAX ) 0.4 MG CAPS capsule [id number]  Take 0.4 mg by mouth in the morning and at bedtime. [provider]  Active Family Member  traMADol  (ULTRAM ) 50 MG tablet [id number]  Take 1 tablet (50 mg total) by mouth every 6 (six) hours as needed (pain not controlled with Tylenol , ibuprofen  and Robaxin ). Montey Apa, DO  Active             Recommendation:   Continue Current Plan of Care Follow up with HHPT for planned visit this week  Assess the need for the Lidocaine  patch application  Follow Up Plan:   Telephone follow-up in 1 week  Gareld June, BSN, RN   VBCI - [facility] RN Care Manager [phone]

## 2023-08-19 NOTE — Patient Instructions (Signed)
 Visit Information  Thank you for taking time to visit with me today. Please don't hesitate to contact me if I can be of assistance to you before our next scheduled telephone appointment.  Our next appointment is by telephone on Wednesday June 11th  at 1:00pm  Following is a copy of your care plan:   Goals Addressed             This Visit's Progress    VBCI Transitions of Care (TOC) Care Plan       Problems: (reviewed 08/19/23) Recent Hospitalization for treatment of COPD and Low Back Pain  Goal: (reviewed 08/19/23) Over the next 30 days, the patient will not experience hospital readmission  Interventions:   COPD Interventions:  (reviewed 08/19/23) Advised patient to engage in light exercise as tolerated 3-5 days a week to aid in the the management of COPD Advised patient to track and manage COPD triggers Assessed social determinant of health barriers Discussed the importance of adequate rest and management of fatigue with COPD Provided education about and advised patient to utilize infection prevention strategies to reduce risk of respiratory infection Provided instruction about proper use of medications used for management of COPD including inhalers Use of home oxygen  - 2l/min continuous HHPT as directed by the therapist  Pain Interventions: (reviewed 08/19/23) Pain assessment performed Medications reviewed Reviewed provider established plan for pain management Counseled on the importance of reporting any/all new or changed pain symptoms or management strategies to pain management provider Advised patient to report to care team affect of pain on daily activities Discussed use of relaxation techniques and/or diversional activities to assist with pain reduction (distraction, imagery, relaxation, massage, acupressure, TENS, heat, and cold application Reviewed with patient prescribed pharmacological and nonpharmacological pain relief strategies  Patient Self Care Activities: (reviewed  08/19/23) Attend all scheduled provider appointments Call pharmacy for medication refills 3-7 days in advance of running out of medications Call provider office for new concerns or questions  Notify RN Care Manager of [facility] call rescheduling needs Participate in Transition of Care Program/Attend TOC scheduled calls Take medications as prescribed   eliminate smoking in my home identify and remove indoor air pollutants limit outdoor activity during cold weather do breathing exercises every day arrange respite care for caregiver eliminate symptom triggers at home use devices that will help like a cane, sock-puller or reacher do breathing exercises every day  Plan:  Telephone follow up appointment with care management team member scheduled for:  Wednesday June 11th at 1:00pm        The patient verbalized understanding of instructions, educational materials, and care plan provided today and agreed to receive a mailed copy of patient instructions, educational materials, and care plan.   The patient has been provided with contact information for the care management team and has been advised to call with any health related questions or concerns.   Please call the care guide team at [phone] if you need to cancel or reschedule your appointment.   Please call the Suicide and Crisis Lifeline: 988 call the [place]  National Suicide Prevention Lifeline: (678) 816-9952 or TTY: 906-378-4340 TTY (740)254-2267) to talk to a trained counselor if you are experiencing a Mental Health or Behavioral Health Crisis or need someone to talk to.  Gareld June, BSN, RN [place]  VBCI - [facility] RN Care Manager [phone]

## 2023-08-20 DIAGNOSIS — S22068D Other fracture of T7-T8 thoracic vertebra, subsequent encounter for fracture with routine healing: Secondary | ICD-10-CM | POA: Diagnosis not present

## 2023-08-20 DIAGNOSIS — E872 Acidosis, unspecified: Secondary | ICD-10-CM | POA: Diagnosis not present

## 2023-08-20 DIAGNOSIS — E7800 Pure hypercholesterolemia, unspecified: Secondary | ICD-10-CM | POA: Diagnosis not present

## 2023-08-20 DIAGNOSIS — E1165 Type 2 diabetes mellitus with hyperglycemia: Secondary | ICD-10-CM | POA: Diagnosis not present

## 2023-08-20 DIAGNOSIS — E559 Vitamin D deficiency, unspecified: Secondary | ICD-10-CM | POA: Diagnosis not present

## 2023-08-20 DIAGNOSIS — J441 Chronic obstructive pulmonary disease with (acute) exacerbation: Secondary | ICD-10-CM | POA: Diagnosis not present

## 2023-08-20 DIAGNOSIS — Z9981 Dependence on supplemental oxygen: Secondary | ICD-10-CM | POA: Diagnosis not present

## 2023-08-20 DIAGNOSIS — C3431 Malignant neoplasm of lower lobe, right bronchus or lung: Secondary | ICD-10-CM | POA: Diagnosis not present

## 2023-08-20 DIAGNOSIS — I119 Hypertensive heart disease without heart failure: Secondary | ICD-10-CM | POA: Diagnosis not present

## 2023-08-20 DIAGNOSIS — J9611 Chronic respiratory failure with hypoxia: Secondary | ICD-10-CM | POA: Diagnosis not present

## 2023-08-20 DIAGNOSIS — M1990 Unspecified osteoarthritis, unspecified site: Secondary | ICD-10-CM | POA: Diagnosis not present

## 2023-08-20 DIAGNOSIS — F209 Schizophrenia, unspecified: Secondary | ICD-10-CM | POA: Diagnosis not present

## 2023-08-20 DIAGNOSIS — K219 Gastro-esophageal reflux disease without esophagitis: Secondary | ICD-10-CM | POA: Diagnosis not present

## 2023-08-20 DIAGNOSIS — F319 Bipolar disorder, unspecified: Secondary | ICD-10-CM | POA: Diagnosis not present

## 2023-08-20 DIAGNOSIS — N400 Enlarged prostate without lower urinary tract symptoms: Secondary | ICD-10-CM | POA: Diagnosis not present

## 2023-08-21 NOTE — Transitions of Care (Post Inpatient/ED Visit) (Signed)
 08/21/2023  Patient ID: Ileene Mallick, male   DOB: 1955/11/21, 68 y.o.   MRN: [id number]  Phone call place to the sister today and the follow information was discussed:  Outreach to Dr. Rosey Constant office to review most recent PCP office visit and any changes in medications, diagnostics, labs, etc. - Labs were drawn at the appointment. There are no results in the computer as his provider is an Psychologist, clinical. The provider stated he will assume responsibility for the medications that the patient is on. Ensure start of care by [facility] (Palliative care) and calendar dates of expected follow-up - [facility] has called the patient a couple of times to initiate services and the patient's sister states she has missed the calls and will call back today Ensure Pulmonary follow-up is scheduled, Follow-up on CT scan results - Pulmonology is scheduled in August with CT scan Complete weekly Review of systems to assess for changes in condition - Updated Ensure accurate medication administration: Prednisone  taper on discharge 30 mg x 2 days>> 20 mg x 2 days >>10 mg x 2 days. - The patient is on his last two days of the Prednisone  Completed 5 day course Rocephin  & Zithromax  - Complete Treated with Pulmicort  nebs, scheduled Duonebs, Albuterol  nebs Q2H PRN - The patient is compliant with the inhalers and nebulizer treatments Scheduled Mucinex  BID - The patient is compliant with taking Mucinex  BID Ensure use of maintenance medications and plan for next scripts are in place. Rx for Pulmicort  nebs sent -- this may be used in place of maintenance inhaler if pt is unable to unwilling to take inhaler; may prevent respiratory decompensation and readmission - Reviewed medications with the sister, those that are maintenance and those for rescue to prevent respiratory decompensation. The patient's sister provided verbal understanding and feedback Ensure accurate use of continuous oxygen  supplementation - The patient  remains on continuous oxygen  Ensure labs as ordered are completed: CBC, 1-2 weeks - Labs were drawn per the patient's sister CMP/BMP at follow-up ? Both are ordered on the discharge Monitor bowel regimen and pain assessment.  - The patient's sister gives him Miralax  and Epsom salts as needed for constipation. His pain is improving, and he is becoming more active. The Lidocaine  patches are expensive and she has only picked up two  Review blood sugars in TOC outreach- patient steroids- should expect hyperglycemia- review parameters and when to call- The patient's sister does not monitor the patient's blood sugar at home. He was started on Januvia in November 2024 and she was not told that she needed to monitor his sugars. She does not have a meter. CM to send a message to the provider for a prescription for a meter and test strips.  Coordinate with [facility] to ensure symptoms are well-managed. Focus on non-pharmacological pain measures in TOC outreaches. Ensure there is a plan in place for weekend needs. - The patient's sister currently uses a bag of warm rice on his back. Once [facility] and the patient connect TOC will coordinate symptom control with Palliative. Reviewed crisis management for weekends and who to call.   Gareld June, BSN, RN [place]  VBCI - [facility] RN Care Manager [phone]

## 2023-08-26 ENCOUNTER — Other Ambulatory Visit: Payer: Self-pay

## 2023-08-26 NOTE — Transitions of Care (Post Inpatient/ED Visit) (Signed)
 Transition of Care week 3  Visit Note  08/26/2023  Name: Blake Mcknight MRN: [id number]          DOB: 1955-06-09  Situation: Patient enrolled in [facility] 30-day program. Visit completed with [name] by telephone.   Background:     Past Medical History:  Diagnosis Date   Abdominal pain 06/03/2013   Chest pain 08/10/2023   COPD (chronic obstructive pulmonary disease) (HCC)    with bullous emphysema.    COPD exacerbation (HCC) 08/09/2023   Heavy cigarette smoker before 2003   pt claims only 10 cigs per day, never heavier amounts.    HLD (hyperlipidemia)    Hypertension    Schizophrenia (HCC) 06/28/2013   This is a chronic condition and he lives with family    Assessment: Patient Reported Symptoms: Cognitive Cognitive Status: Alert and oriented to person, place, and time, Difficulties with attention and concentration, Poor judgment in daily scenarios Cognitive/Intellectual Conditions Management [RPT]: Behavior Disorders Behavior Disorders: Schizophrenia   Health Maintenance Behaviors: Spiritual practice(s) Health Facilitated by: Pain control, Rest  Neurological Neurological Review of Symptoms: No symptoms reported    HEENT HEENT Symptoms Reported: No symptoms reported      Cardiovascular Cardiovascular Symptoms Reported: No symptoms reported Does patient have uncontrolled Hypertension?: No Weight: 165 lb (74.8 kg)  Respiratory Respiratory Symptoms Reported: Shortness of breath, Wheezing, Chest tightness Additional Respiratory Details: Continuous oxygen . Respiratory Conditions: COPD, Shortness of breath Respiratory Self-Management Outcome: 3 (uncertain) Respiratory Comment: End Stage COPD. Treated with Duonebs, Albuterol  nebs, Pulmicort  meds. Antibiotics are complete. Mucinex  is completed. Prednisone  taper is complete althought the patient continues with daily Prednisone . Provider notified. The sister has not followed up with Palliative Care  Endocrine Patient  reports the following symptoms related to hypoglycemia or hyperglycemia : No symptoms reported Is patient diabetic?: No Endocrine Comment: Contacted the provider for glucose meter due to the patient is on steroids. The patient could have episodes of Hyperglycemia  Gastrointestinal Gastrointestinal Symptoms Reported: Constipation Gastrointestinal Conditions: Constipation Gastrointestinal Management Strategies: Medication therapy, Fluid modification, Activity Gastrointestinal Self-Management Outcome: 4 (good) Nutrition Risk Screen (CP): Reduced oral intake over the last month  Genitourinary Genitourinary Symptoms Reported: No symptoms reported    Integumentary Integumentary Symptoms Reported: No symptoms reported    Musculoskeletal Musculoskelatal Symptoms Reviewed: Muscle pain, Weakness, Unsteady gait Additional Musculoskeletal Details: Still has significant back pain due to Thoracic fracture. The patient is using Lidocaine  patches and heat.HHPT and RN are coming to the home weekly to assess Musculoskeletal Conditions: Back pain, Fracture, Mobility limited, Unsteady gait Musculoskeletal Management Strategies: Activity, Medication therapy, Medical device, Routine screening, Coping strategies Musculoskeletal Self-Management Outcome: 3 (uncertain) Musculoskeletal Comment: The patient states his pain has improved a little but he still has it and he is taking Tylenol  and Tramadol  and uses Lidocaine  patches Falls in the past year?: No Number of falls in past year: 1 or less Was there an injury with Fall?: No Fall Risk Category Calculator: 0 Patient Fall Risk Level: Low Fall Risk Patient at Risk for Falls Due to: Medication side effect, Impaired mobility, Mental status change Fall risk Follow up: Falls evaluation completed, Falls prevention discussed  Psychosocial Psychosocial Symptoms Reported: Psychosis, Phobias Additional Psychological Details: Schizophrenia with auditory  hallucinations Behavioral Health Conditions: Psychosis, Phobia(s) Behavioral Management Strategies: Coping strategies, Medication therapy, Support system Behavioral Health Self-Management Outcome: 4 (good) Behavioral Health Comment: Current mental status is stable   Do you feel physically threatened by others?: No   There were no vitals filed  for this visit.  Medications Reviewed Today     Reviewed by Claudene Crystal, RN (Case Manager) on 08/26/23 at 1455  Med List Status: <None>   Medication Order Taking? Sig Documenting Provider Last Dose Status Informant  acetaminophen  (TYLENOL ) 500 MG tablet [id number]  Take 2 tablets (1,000 mg total) by mouth every 8 (eight) hours. Darus Engels A, DO  Active   albuterol  (PROVENTIL ) (2.5 MG/3ML) 0.083% nebulizer solution [id number]  Take 3 mLs (2.5 mg total) by nebulization every 4 (four) hours as needed for wheezing or shortness of breath. Darus Engels A, DO  Active   albuterol  (VENTOLIN  HFA) 108 (90 Base) MCG/ACT inhaler [id number]  Inhale 2 puffs into the lungs every 4 (four) hours as needed for wheezing or shortness of breath. Guadalupe Lee, MD  Active Family Member  amLODipine  (NORVASC ) 5 MG tablet [id number]  Take 5 mg by mouth daily. [provider]  Active Family Member  atorvastatin  (LIPITOR) 10 MG tablet [id number]  Take 1 tablet (10 mg total) by mouth at bedtime. Darus Engels A, DO  Active   benztropine  (COGENTIN ) 2 MG tablet [id number]  Take 1 tablet (2 mg total) by mouth 2 (two) times daily. Arfeen, Syed T, MD  Active Family Member  budesonide  (PULMICORT ) 0.25 MG/2ML nebulizer solution [id number]  Take 2 mLs (0.25 mg total) by nebulization 2 (two) times daily. Darus Engels A, DO  Active   budesonide -glycopyrrolate -formoterol  (BREZTRI AEROSPHERE) 160-9-4.8 MCG/ACT AERO inhaler [id number]  Inhale 1-2 puffs into the lungs 2 (two) times daily. [provider]  Active Family Member  carvedilol  (COREG ) 3.125 MG tablet  [id number]  Take 1 tablet (3.125 mg total) by mouth 2 (two) times daily with a meal. Audria Leather, MD  Active Family Member  cetirizine (ZYRTEC) 10 MG tablet [id number]  Take 10 mg by mouth daily as needed for allergies or rhinitis. [provider]  Active Family Member  cholecalciferol (VITAMIN D) 25 MCG (1000 UNIT) tablet [id number]  Take 5,000 Units by mouth every morning. [provider]  Active Family Member  famotidine  (PEPCID ) 40 MG tablet [id number]  Take 40 mg by mouth 2 (two) times daily. [provider]  Active Family Member  feeding supplement, GLUCERNA SHAKE, (GLUCERNA SHAKE) LIQD [id number]  Take 237 mLs by mouth 3 (three) times daily between meals. Darus Engels A, DO  Active   ibuprofen  (ADVIL ) 400 MG tablet [id number]  Take 1 tablet (400 mg total) by mouth every 6 (six) hours as needed (moderate to severe pain). Darus Engels A, DO  Active   ipratropium-albuterol  (DUONEB) 0.5-2.5 (3) MG/3ML SOLN [id number]  Take 3 mLs by nebulization every 4 (four) hours. Montey Apa, DO  Active   JANUVIA 100 MG tablet [id number]  Take 100 mg by mouth daily. [provider]  Active Family Member           Med Note (WHITE, Anthony Kirk Aug 09, 2023 11:35 PM)    lidocaine  (LIDODERM ) 5 % [id number] Yes Place 1 patch onto the skin daily. Remove & Discard patch within 12 hours or as directed by MD Montey Apa, DO Taking Active   melatonin 5 MG TABS [id number]  Take 1 tablet (5 mg total) by mouth at bedtime as needed. Darus Engels A, DO  Active   methocarbamol  (ROBAXIN ) 750 MG tablet [id number]  Take 1 tablet (750 mg total) by mouth every 6 (six) hours as needed for muscle spasms. Darus Engels A, DO  Active   mirtazapine  (REMERON )  30 MG tablet [id number]  Take 1 tablet (30 mg total) by mouth at bedtime as needed (insomnia).  Patient taking differently: Take 30 mg by mouth at bedtime.   Arfeen, Syed T, MD  Active Family Member  multivitamin (ONE-A-DAY MEN'S)  TABS tablet [id number]  Take 1 tablet by mouth daily.  Patient not taking: Reported on 08/09/2023   [provider]  Active Family Member           Med Note [facility], Davina Ester Apr 16, 2023  3:18 AM) Taken occasionally   OXYGEN  [id number]  Inhale 2 L/min into the lungs continuous.  [provider]  Active Family Member  polyethylene glycol (MIRALAX  / GLYCOLAX ) 17 g packet [id number]  Take 17 g by mouth daily as needed for mild constipation. [provider]  Active Family Member  potassium chloride  SA (KLOR-CON  M) 20 MEQ tablet [id number]  Take 1 tablet (20 mEq total) by mouth 2 (two) times daily. Montey Apa, DO  Active   predniSONE  (DELTASONE ) 10 MG tablet [id number] Yes Take 10 mg by mouth daily with breakfast. [provider]  Active   risperiDONE  (RISPERDAL ) 3 MG tablet [id number]  Take 1 tablet (3 mg total) by mouth 2 (two) times daily. Arfeen, Syed T, MD  Active Family Member  sodium chloride  (OCEAN) 0.65 % SOLN nasal spray [id number]  Place 1 spray into both nostrils as needed for congestion. [provider]  Active Family Member  tamsulosin  (FLOMAX ) 0.4 MG CAPS capsule [id number]  Take 0.4 mg by mouth in the morning and at bedtime. [provider]  Active Family Member  traMADol  (ULTRAM ) 50 MG tablet [id number]  Take 1 tablet (50 mg total) by mouth every 6 (six) hours as needed (pain not controlled with Tylenol , ibuprofen  and Robaxin ). Montey Apa, DO  Active             Recommendation:   Ensure Pulmonary follow-up is scheduled, Follow-up on CT scan results - Pulmonology is scheduled in August with CT scan Treat with Pulmicort  nebs, scheduled Duonebs, Albuterol  nebs Q2H PRN - The patient is compliant with the inhalers and nebulizer treatments prevent respiratory decompensation and readmission - Reviewed medications with the sister, those that are maintenance and those for rescue to prevent respiratory decompensation. The  patient's sister provided verbal understanding and feedback Ensure accurate use of continuous oxygen  supplementation - The patient remains on continuous oxygen  Monitor bowel regimen and pain assessment.   Coordinate with [facility] to ensure symptoms are well-managed.   Follow Up Plan:   Telephone follow-up in 1 week  Gareld June, BSN, RN [place]  VBCI - [facility] Health RN Care Manager [phone]

## 2023-08-26 NOTE — Patient Instructions (Signed)
 Visit Information  Thank you for taking time to visit with me today. Please don't hesitate to contact me if I can be of assistance to you before our next scheduled telephone appointment.  Our next appointment is by telephone on Wednesday June 18th at 1:00pm  Following is a copy of your care plan:   Goals Addressed             This Visit's Progress    VBCI Transitions of Care (TOC) Care Plan       Problems: (reviewed 08/26/23) Recent Hospitalization for treatment of COPD and Low Back Pain  Goal: (reviewed 08/26/23) Over the next 30 days, the patient will not experience hospital readmission  Interventions:   COPD Interventions:  (reviewed 08/26/23) Advised patient to engage in light exercise as tolerated 3-5 days a week to aid in the the management of COPD Advised patient to track and manage COPD triggers Assessed social determinant of health barriers Discussed the importance of adequate rest and management of fatigue with COPD Provided education about and advised patient to utilize infection prevention strategies to reduce risk of respiratory infection Provided instruction about proper use of medications used for management of COPD including inhalers Use of home oxygen  Oxygen  - 2l/min continuous HHPT as directed by the therapist Treatment with Pulmicort  nebs, scheduled Duonebs and Albuterol  nebs every 2 hours as needed. Use of Inhalers - Nebulizer may be used in place of maintenance inhaler if pt is unable to unwilling to take inhaler; may prevent respiratory decompensation and readmission   Pain Interventions: (reviewed 08/26/23) Pain assessment performed Medications reviewed Reviewed provider established plan for pain management Counseled on the importance of reporting any/all new or changed pain symptoms or management strategies to pain management provider Advised patient to report to care team affect of pain on daily activities Discussed use of relaxation techniques and/or  diversional activities to assist with pain reduction (distraction, imagery, relaxation, massage, acupressure, TENS, heat, and cold application Reviewed with patient prescribed pharmacological and nonpharmacological pain relief strategies Lidocaine  patches daily - on for 12 hours then off for 12 hours Monitor Bowel regime  Patient Self Care Activities: (reviewed 08/26/23) Attend all scheduled provider appointments Call pharmacy for medication refills 3-7 days in advance of running out of medications Call provider office for new concerns or questions  Notify RN Care Manager of [facility] call rescheduling needs Participate in Transition of Care Program/Attend TOC scheduled calls Take medications as prescribed   eliminate smoking in my home identify and remove indoor air pollutants limit outdoor activity during cold weather do breathing exercises every day arrange respite care for caregiver eliminate symptom triggers at home use devices that will help like a cane, sock-puller or reacher do breathing exercises every day  Plan:  Telephone follow up appointment with care management team member scheduled for:  Wednesday June 18th at 1:00pm        The patient has been provided with contact information for the care management team and has been advised to call with any health related questions or concerns.   Please call the care guide team at [phone] if you need to cancel or reschedule your appointment.   Please call the Suicide and Crisis Lifeline: 988 call the [place]  National Suicide Prevention Lifeline: 386-367-1262 or TTY: 352-143-3815 TTY 469-375-8534) to talk to a trained counselor if you are experiencing a Mental Health or Behavioral Health Crisis or need someone to talk to.   Gareld June, BSN, Dance movement psychotherapist Health  VBCI - [employer]  RN Care Manager [phone]

## 2023-08-27 ENCOUNTER — Telehealth: Payer: Self-pay

## 2023-08-27 ENCOUNTER — Other Ambulatory Visit (HOSPITAL_COMMUNITY): Payer: Self-pay

## 2023-08-27 ENCOUNTER — Ambulatory Visit: Admitting: Emergency Medicine

## 2023-08-27 DIAGNOSIS — J441 Chronic obstructive pulmonary disease with (acute) exacerbation: Secondary | ICD-10-CM | POA: Diagnosis not present

## 2023-08-27 DIAGNOSIS — C3431 Malignant neoplasm of lower lobe, right bronchus or lung: Secondary | ICD-10-CM | POA: Diagnosis not present

## 2023-08-27 DIAGNOSIS — E1165 Type 2 diabetes mellitus with hyperglycemia: Secondary | ICD-10-CM | POA: Diagnosis not present

## 2023-08-27 DIAGNOSIS — S22068D Other fracture of T7-T8 thoracic vertebra, subsequent encounter for fracture with routine healing: Secondary | ICD-10-CM | POA: Diagnosis not present

## 2023-08-27 DIAGNOSIS — I119 Hypertensive heart disease without heart failure: Secondary | ICD-10-CM | POA: Diagnosis not present

## 2023-08-27 DIAGNOSIS — J9611 Chronic respiratory failure with hypoxia: Secondary | ICD-10-CM | POA: Diagnosis not present

## 2023-08-27 NOTE — Transitions of Care (Post Inpatient/ED Visit) (Signed)
   08/27/2023  Name: BRANTLY KALMAN MRN: [id number] DOB: 1955/11/19  Contacted the patient's provider because the patient's sister had continued to administer Prednisone  10mg  daily after the taper had finished because he had been on it before. She also states she would give it to him as needed if she felt he was having SOB. Dr. Eladio Graven said to stop the Prednisone . The patient's sister Annabell Baron was contacted and she voiced understanding.   Gareld June, BSN, RN [place]  VBCI - [employer] Health RN Care Manager [phone]

## 2023-09-01 DIAGNOSIS — Z125 Encounter for screening for malignant neoplasm of prostate: Secondary | ICD-10-CM | POA: Diagnosis not present

## 2023-09-01 DIAGNOSIS — I119 Hypertensive heart disease without heart failure: Secondary | ICD-10-CM | POA: Diagnosis not present

## 2023-09-01 DIAGNOSIS — J9611 Chronic respiratory failure with hypoxia: Secondary | ICD-10-CM | POA: Diagnosis not present

## 2023-09-01 DIAGNOSIS — S22000D Wedge compression fracture of unspecified thoracic vertebra, subsequent encounter for fracture with routine healing: Secondary | ICD-10-CM | POA: Diagnosis not present

## 2023-09-01 DIAGNOSIS — E1165 Type 2 diabetes mellitus with hyperglycemia: Secondary | ICD-10-CM | POA: Diagnosis not present

## 2023-09-01 DIAGNOSIS — Z79891 Long term (current) use of opiate analgesic: Secondary | ICD-10-CM | POA: Diagnosis not present

## 2023-09-01 DIAGNOSIS — J441 Chronic obstructive pulmonary disease with (acute) exacerbation: Secondary | ICD-10-CM | POA: Diagnosis not present

## 2023-09-01 DIAGNOSIS — K2100 Gastro-esophageal reflux disease with esophagitis, without bleeding: Secondary | ICD-10-CM | POA: Diagnosis not present

## 2023-09-01 DIAGNOSIS — C3431 Malignant neoplasm of lower lobe, right bronchus or lung: Secondary | ICD-10-CM | POA: Diagnosis not present

## 2023-09-01 DIAGNOSIS — E7800 Pure hypercholesterolemia, unspecified: Secondary | ICD-10-CM | POA: Diagnosis not present

## 2023-09-01 DIAGNOSIS — F319 Bipolar disorder, unspecified: Secondary | ICD-10-CM | POA: Diagnosis not present

## 2023-09-01 DIAGNOSIS — S22068D Other fracture of T7-T8 thoracic vertebra, subsequent encounter for fracture with routine healing: Secondary | ICD-10-CM | POA: Diagnosis not present

## 2023-09-01 DIAGNOSIS — N401 Enlarged prostate with lower urinary tract symptoms: Secondary | ICD-10-CM | POA: Diagnosis not present

## 2023-09-01 DIAGNOSIS — E559 Vitamin D deficiency, unspecified: Secondary | ICD-10-CM | POA: Diagnosis not present

## 2023-09-01 DIAGNOSIS — M1990 Unspecified osteoarthritis, unspecified site: Secondary | ICD-10-CM | POA: Diagnosis not present

## 2023-09-02 ENCOUNTER — Other Ambulatory Visit: Payer: Self-pay

## 2023-09-02 DIAGNOSIS — J441 Chronic obstructive pulmonary disease with (acute) exacerbation: Secondary | ICD-10-CM | POA: Diagnosis not present

## 2023-09-02 DIAGNOSIS — S22068D Other fracture of T7-T8 thoracic vertebra, subsequent encounter for fracture with routine healing: Secondary | ICD-10-CM | POA: Diagnosis not present

## 2023-09-02 DIAGNOSIS — I119 Hypertensive heart disease without heart failure: Secondary | ICD-10-CM | POA: Diagnosis not present

## 2023-09-02 DIAGNOSIS — C3431 Malignant neoplasm of lower lobe, right bronchus or lung: Secondary | ICD-10-CM | POA: Diagnosis not present

## 2023-09-02 DIAGNOSIS — J9611 Chronic respiratory failure with hypoxia: Secondary | ICD-10-CM | POA: Diagnosis not present

## 2023-09-02 DIAGNOSIS — E1165 Type 2 diabetes mellitus with hyperglycemia: Secondary | ICD-10-CM | POA: Diagnosis not present

## 2023-09-02 NOTE — Transitions of Care (Post Inpatient/ED Visit) (Signed)
 Transition of Care week 3  Visit Note  09/02/2023  Name: Blake Mcknight MRN: [id number]          DOB: 1955/03/18  Situation: Patient enrolled in [facility] 30-day program. Visit completed with Link Rice by telephone.   Background:     Past Medical History:  Diagnosis Date   Abdominal pain 06/03/2013   Chest pain 08/10/2023   COPD (chronic obstructive pulmonary disease) (HCC)    with bullous emphysema.    COPD exacerbation (HCC) 08/09/2023   Heavy cigarette smoker before 2003   pt claims only 10 cigs per day, never heavier amounts.    HLD (hyperlipidemia)    Hypertension    Schizophrenia (HCC) 06/28/2013   This is a chronic condition and he lives with family    Assessment: Patient Reported Symptoms: Cognitive Cognitive Status: Alert and oriented to person, place, and time, Difficulties with attention and concentration, Poor judgment in daily scenarios Cognitive/Intellectual Conditions Management [RPT]: Behavior Disorders Behavior Disorders: Schizophrenia   Health Maintenance Behaviors: Spiritual practice(s) Health Facilitated by: Pain control  Neurological Neurological Review of Symptoms: No symptoms reported    HEENT HEENT Symptoms Reported: No symptoms reported      Cardiovascular Cardiovascular Symptoms Reported: No symptoms reported Does patient have uncontrolled Hypertension?: No    Respiratory Respiratory Symptoms Reported: Shortness of breath Additional Respiratory Details: SOB with exertion. Continues with oxygen  Respiratory Conditions: COPD, Shortness of breath Respiratory Self-Management Outcome: 3 (uncertain) Respiratory Comment: The patient has end stage COPD/ He continues with Duonebs, Albuterol  nebs, Pulicort meds. The patient needs support in taking his inhalers. His sister is able to assist with administration and compliance. Palliative care has not been involved yet.  Endocrine Patient reports the following symptoms related to hypoglycemia or  hyperglycemia : No symptoms reported Is patient diabetic?: No    Gastrointestinal Gastrointestinal Symptoms Reported: Constipation Gastrointestinal Conditions: Constipation Gastrointestinal Management Strategies: Medication therapy, Fluid modification, Activity Gastrointestinal Self-Management Outcome: 4 (good) Gastrointestinal Comment: He continues to have constipation and his sister has been instructed on fluids, activity and medications to assist with moving his bowels.    Genitourinary Genitourinary Symptoms Reported: No symptoms reported    Integumentary Integumentary Symptoms Reported: No symptoms reported    Musculoskeletal Musculoskelatal Symptoms Reviewed: Muscle pain, Unsteady gait, Weakness Additional Musculoskeletal Details: The patient continues to complain of pain to his back. He went to Dr. Eladio Graven who prescribed Tramadol . His sister gave him one today and he expressed good relief. He also wears the Lidocaine  patches. HHPT comes weekly Musculoskeletal Conditions: Back pain, Fracture, Mobility limited, Unsteady gait Musculoskeletal Management Strategies: Activity, Medication therapy, Medical device, Routine screening, Coping strategies Musculoskeletal Self-Management Outcome: 3 (uncertain) Musculoskeletal Comment: The pain has improved a little but his pain level still reaches an 8 at times. His activity is limited at times due to the pain. He will follow up with Dr. Eladio Graven in a few weeks.      Psychosocial Psychosocial Symptoms Reported: Psychosis, Phobias Additional Psychological Details: Schizophrenia with auditory hallucinations at times Behavioral Health Conditions: Psychosis, Phobia(s) Behavioral Management Strategies: Coping strategies, Medication therapy, Support system Behavioral Health Self-Management Outcome: 4 (good) Behavioral Health Comment: Stable   Do you feel physically threatened by others?: No   There were no vitals filed for this  visit.  Medications Reviewed Today     Reviewed by Claudene Crystal, RN (Case Manager) on 09/02/23 at 1305  Med List Status: <None>   Medication Order Taking? Sig Documenting Provider Last Dose Status Informant  acetaminophen  (  TYLENOL ) 500 MG tablet [id number]  Take 2 tablets (1,000 mg total) by mouth every 8 (eight) hours. Darus Engels A, DO  Active   albuterol  (PROVENTIL ) (2.5 MG/3ML) 0.083% nebulizer solution [id number]  Take 3 mLs (2.5 mg total) by nebulization every 4 (four) hours as needed for wheezing or shortness of breath. Darus Engels A, DO  Active   albuterol  (VENTOLIN  HFA) 108 (90 Base) MCG/ACT inhaler [id number]  Inhale 2 puffs into the lungs every 4 (four) hours as needed for wheezing or shortness of breath. Guadalupe Lee, MD  Active Family Member  amLODipine  (NORVASC ) 5 MG tablet [id number]  Take 5 mg by mouth daily. [provider]  Active Family Member  atorvastatin  (LIPITOR) 10 MG tablet [id number]  Take 1 tablet (10 mg total) by mouth at bedtime. Darus Engels A, DO  Active   benztropine  (COGENTIN ) 2 MG tablet [id number]  Take 1 tablet (2 mg total) by mouth 2 (two) times daily. Arfeen, Syed T, MD  Active Family Member  budesonide  (PULMICORT ) 0.25 MG/2ML nebulizer solution [id number]  Take 2 mLs (0.25 mg total) by nebulization 2 (two) times daily. Darus Engels A, DO  Active   budesonide -glycopyrrolate -formoterol  (BREZTRI AEROSPHERE) 160-9-4.8 MCG/ACT AERO inhaler [id number]  Inhale 1-2 puffs into the lungs 2 (two) times daily. [provider]  Active Family Member  carvedilol  (COREG ) 3.125 MG tablet [id number]  Take 1 tablet (3.125 mg total) by mouth 2 (two) times daily with a meal. Audria Leather, MD  Active Family Member  cetirizine (ZYRTEC) 10 MG tablet [id number]  Take 10 mg by mouth daily as needed for allergies or rhinitis. [provider]  Active Family Member  cholecalciferol (VITAMIN D) 25 MCG (1000 UNIT) tablet [id number]  Take 5,000 Units by  mouth every morning. [provider]  Active Family Member  famotidine  (PEPCID ) 40 MG tablet [id number]  Take 40 mg by mouth 2 (two) times daily. [provider]  Active Family Member  feeding supplement, GLUCERNA SHAKE, (GLUCERNA SHAKE) LIQD [id number]  Take 237 mLs by mouth 3 (three) times daily between meals. Darus Engels A, DO  Active   ibuprofen  (ADVIL ) 400 MG tablet [id number]  Take 1 tablet (400 mg total) by mouth every 6 (six) hours as needed (moderate to severe pain). Darus Engels A, DO  Active   ipratropium-albuterol  (DUONEB) 0.5-2.5 (3) MG/3ML SOLN [id number]  Take 3 mLs by nebulization every 4 (four) hours. Montey Apa, DO  Active   JANUVIA 100 MG tablet [id number]  Take 100 mg by mouth daily. [provider]  Active Family Member           Med Note (WHITE, Anthony Kirk Aug 09, 2023 11:35 PM)    lidocaine  (LIDODERM ) 5 % [id number]  Place 1 patch onto the skin daily. Remove & Discard patch within 12 hours or as directed by MD Montey Apa, DO  Active   melatonin 5 MG TABS [id number]  Take 1 tablet (5 mg total) by mouth at bedtime as needed. Darus Engels A, DO  Active   methocarbamol  (ROBAXIN ) 750 MG tablet [id number]  Take 1 tablet (750 mg total) by mouth every 6 (six) hours as needed for muscle spasms. Darus Engels A, DO  Active   mirtazapine  (REMERON ) 30 MG tablet [id number]  Take 1 tablet (30 mg total) by mouth at bedtime as needed (insomnia).  Patient taking differently: Take 30 mg by mouth at bedtime.   Arfeen, Syed T, MD  Active Family Member  multivitamin (  ONE-A-DAY MEN'S) TABS tablet [id number]  Take 1 tablet by mouth daily.  Patient not taking: Reported on 08/09/2023   [provider]  Active Family Member           Med Note [facility], Davina Ester Apr 16, 2023  3:18 AM) Taken occasionally   OXYGEN  [id number]  Inhale 2 L/min into the lungs continuous.  [provider]  Active Family Member  polyethylene glycol (MIRALAX  /  GLYCOLAX ) 17 g packet [id number]  Take 17 g by mouth daily as needed for mild constipation. [provider]  Active Family Member  potassium chloride  SA (KLOR-CON  M) 20 MEQ tablet [id number]  Take 1 tablet (20 mEq total) by mouth 2 (two) times daily. Darus Engels A, DO  Active   predniSONE  (DELTASONE ) 10 MG tablet [id number]  Take 10 mg by mouth daily with breakfast. [provider]  Active   risperiDONE  (RISPERDAL ) 3 MG tablet [id number]  Take 1 tablet (3 mg total) by mouth 2 (two) times daily. Arfeen, Syed T, MD  Active Family Member  sodium chloride  (OCEAN) 0.65 % SOLN nasal spray [id number]  Place 1 spray into both nostrils as needed for congestion. [provider]  Active Family Member  tamsulosin  (FLOMAX ) 0.4 MG CAPS capsule [id number]  Take 0.4 mg by mouth in the morning and at bedtime. [provider]  Active Family Member  traMADol  (ULTRAM ) 50 MG tablet [id number]  Take 1 tablet (50 mg total) by mouth every 6 (six) hours as needed (pain not controlled with Tylenol , ibuprofen  and Robaxin ). Montey Apa, DO  Active             Recommendation:   Continue Current Plan of Care  Follow Up Plan:   Telephone follow-up in 1 week  Gareld June, BSN, RN [place]  VBCI - [facility] RN Care Manager [phone]

## 2023-09-02 NOTE — Patient Instructions (Signed)
 Visit Information  Thank you for taking time to visit with me today. Please don't hesitate to contact me if I can be of assistance to you before our next scheduled telephone appointment.  Our next appointment is by telephone on Wednesday June 25th at 1:00pm  Following is a copy of your care plan:   Goals Addressed             This Visit's Progress    [facility] Transitions of Care (TOC) Care Plan       Problems: (reviewed 09/02/23) Recent Hospitalization for treatment of COPD and Low Back Pain  Goal: (reviewed 09/02/23) Over the next 30 days, the patient will not experience hospital readmission  Interventions:   COPD Interventions:  (reviewed 09/02/23) Advised patient to engage in light exercise as tolerated 3-5 days a week to aid in the the management of COPD Advised patient to track and manage COPD triggers Assessed social determinant of health barriers Discussed the importance of adequate rest and management of fatigue with COPD Provided education about and advised patient to utilize infection prevention strategies to reduce risk of respiratory infection Provided instruction about proper use of medications used for management of COPD including inhalers Use of home oxygen  Oxygen  - 2l/min continuous HHPT as directed by the therapist Treatment with Pulmicort  nebs, scheduled Duonebs and Albuterol  nebs every 2 hours as needed. Use of Inhalers - Nebulizer may be used in place of maintenance inhaler if pt is unable to unwilling to take inhaler; may prevent respiratory decompensation and readmission   Pain Interventions: (reviewed 09/02/23) Pain assessment performed Medications reviewed Reviewed provider established plan for pain management Counseled on the importance of reporting any/all new or changed pain symptoms or management strategies to pain management provider Advised patient to report to care team affect of pain on daily activities Discussed use of relaxation techniques and/or  diversional activities to assist with pain reduction (distraction, imagery, relaxation, massage, acupressure, TENS, heat, and cold application Reviewed with patient prescribed pharmacological and nonpharmacological pain relief strategies Lidocaine  patches daily - on for 12 hours then off for 12 hours Monitor Bowel regime Tramadol  every 6 hours PRN  Patient Self Care Activities: (reviewed 09/02/23) Attend all scheduled provider appointments Call pharmacy for medication refills 3-7 days in advance of running out of medications Call provider office for new concerns or questions  Notify RN Care Manager of TOC call rescheduling needs Participate in Transition of Care Program/Attend TOC scheduled calls Take medications as prescribed   eliminate smoking in my home identify and remove indoor air pollutants limit outdoor activity during cold weather do breathing exercises every day arrange respite care for caregiver eliminate symptom triggers at home use devices that will help like a cane, sock-puller or reacher do breathing exercises every day  Plan:  Telephone follow up appointment with care management team member scheduled for:  Wednesday June 25th at 1:00pm        The patient verbalized understanding of instructions, educational materials, and care plan provided today and agreed to receive a mailed copy of patient instructions, educational materials, and care plan.   The patient has been provided with contact information for the care management team and has been advised to call with any health related questions or concerns.   Please call the care guide team at [phone] if you need to cancel or reschedule your appointment.   Please call the Suicide and Crisis Lifeline: 988 call the [place]  National Suicide Prevention Lifeline: 206-039-2586 or TTY: 951 287 1898 TTY (519)235-4765) to  talk to a trained counselor if you are experiencing a Mental Health or Behavioral Health Crisis or need  someone to talk to.  Gareld June, BSN, RN [place]  [facility] - [employer] [facility] RN Care Manager [phone]

## 2023-09-03 ENCOUNTER — Other Ambulatory Visit: Payer: Self-pay

## 2023-09-03 ENCOUNTER — Emergency Department (HOSPITAL_COMMUNITY)

## 2023-09-03 ENCOUNTER — Encounter (HOSPITAL_COMMUNITY): Payer: Self-pay

## 2023-09-03 ENCOUNTER — Emergency Department (HOSPITAL_COMMUNITY)
Admission: EM | Admit: 2023-09-03 | Discharge: 2023-09-04 | Disposition: A | Discharge status: Home / Self Care | Source: Home / Self Care | Attending: Emergency Medicine | Admitting: Emergency Medicine

## 2023-09-03 DIAGNOSIS — Z515 Encounter for palliative care: Secondary | ICD-10-CM | POA: Diagnosis not present

## 2023-09-03 DIAGNOSIS — I10 Essential (primary) hypertension: Secondary | ICD-10-CM | POA: Insufficient documentation

## 2023-09-03 DIAGNOSIS — R062 Wheezing: Secondary | ICD-10-CM | POA: Insufficient documentation

## 2023-09-03 DIAGNOSIS — M2550 Pain in unspecified joint: Secondary | ICD-10-CM | POA: Diagnosis not present

## 2023-09-03 DIAGNOSIS — X58XXXD Exposure to other specified factors, subsequent encounter: Secondary | ICD-10-CM | POA: Insufficient documentation

## 2023-09-03 DIAGNOSIS — K5900 Constipation, unspecified: Secondary | ICD-10-CM | POA: Diagnosis not present

## 2023-09-03 DIAGNOSIS — Z79899 Other long term (current) drug therapy: Secondary | ICD-10-CM | POA: Diagnosis not present

## 2023-09-03 DIAGNOSIS — R109 Unspecified abdominal pain: Secondary | ICD-10-CM | POA: Insufficient documentation

## 2023-09-03 DIAGNOSIS — Z66 Do not resuscitate: Secondary | ICD-10-CM | POA: Diagnosis not present

## 2023-09-03 DIAGNOSIS — J90 Pleural effusion, not elsewhere classified: Secondary | ICD-10-CM | POA: Diagnosis not present

## 2023-09-03 DIAGNOSIS — M1990 Unspecified osteoarthritis, unspecified site: Secondary | ICD-10-CM | POA: Diagnosis not present

## 2023-09-03 DIAGNOSIS — N2889 Other specified disorders of kidney and ureter: Secondary | ICD-10-CM | POA: Diagnosis not present

## 2023-09-03 DIAGNOSIS — Z9049 Acquired absence of other specified parts of digestive tract: Secondary | ICD-10-CM | POA: Diagnosis not present

## 2023-09-03 DIAGNOSIS — R0689 Other abnormalities of breathing: Secondary | ICD-10-CM | POA: Diagnosis not present

## 2023-09-03 DIAGNOSIS — J441 Chronic obstructive pulmonary disease with (acute) exacerbation: Secondary | ICD-10-CM | POA: Diagnosis not present

## 2023-09-03 DIAGNOSIS — X58XXXA Exposure to other specified factors, initial encounter: Secondary | ICD-10-CM | POA: Diagnosis present

## 2023-09-03 DIAGNOSIS — S22060D Wedge compression fracture of T7-T8 vertebra, subsequent encounter for fracture with routine healing: Secondary | ICD-10-CM | POA: Diagnosis not present

## 2023-09-03 DIAGNOSIS — Z833 Family history of diabetes mellitus: Secondary | ICD-10-CM | POA: Diagnosis not present

## 2023-09-03 DIAGNOSIS — I2510 Atherosclerotic heart disease of native coronary artery without angina pectoris: Secondary | ICD-10-CM | POA: Diagnosis not present

## 2023-09-03 DIAGNOSIS — Z8249 Family history of ischemic heart disease and other diseases of the circulatory system: Secondary | ICD-10-CM | POA: Diagnosis not present

## 2023-09-03 DIAGNOSIS — Z87891 Personal history of nicotine dependence: Secondary | ICD-10-CM | POA: Insufficient documentation

## 2023-09-03 DIAGNOSIS — R0602 Shortness of breath: Secondary | ICD-10-CM | POA: Diagnosis not present

## 2023-09-03 DIAGNOSIS — J449 Chronic obstructive pulmonary disease, unspecified: Secondary | ICD-10-CM | POA: Insufficient documentation

## 2023-09-03 DIAGNOSIS — E878 Other disorders of electrolyte and fluid balance, not elsewhere classified: Secondary | ICD-10-CM | POA: Diagnosis not present

## 2023-09-03 DIAGNOSIS — N281 Cyst of kidney, acquired: Secondary | ICD-10-CM | POA: Diagnosis not present

## 2023-09-03 DIAGNOSIS — R000 Tachycardia, unspecified: Secondary | ICD-10-CM | POA: Diagnosis not present

## 2023-09-03 DIAGNOSIS — K2100 Gastro-esophageal reflux disease with esophagitis, without bleeding: Secondary | ICD-10-CM | POA: Diagnosis not present

## 2023-09-03 DIAGNOSIS — M546 Pain in thoracic spine: Secondary | ICD-10-CM | POA: Diagnosis not present

## 2023-09-03 DIAGNOSIS — E559 Vitamin D deficiency, unspecified: Secondary | ICD-10-CM | POA: Diagnosis not present

## 2023-09-03 DIAGNOSIS — F209 Schizophrenia, unspecified: Secondary | ICD-10-CM | POA: Diagnosis not present

## 2023-09-03 DIAGNOSIS — F203 Undifferentiated schizophrenia: Secondary | ICD-10-CM | POA: Diagnosis not present

## 2023-09-03 DIAGNOSIS — R918 Other nonspecific abnormal finding of lung field: Secondary | ICD-10-CM | POA: Diagnosis not present

## 2023-09-03 DIAGNOSIS — S22060A Wedge compression fracture of T7-T8 vertebra, initial encounter for closed fracture: Secondary | ICD-10-CM | POA: Diagnosis not present

## 2023-09-03 DIAGNOSIS — Z85118 Personal history of other malignant neoplasm of bronchus and lung: Secondary | ICD-10-CM | POA: Diagnosis not present

## 2023-09-03 DIAGNOSIS — E7800 Pure hypercholesterolemia, unspecified: Secondary | ICD-10-CM | POA: Diagnosis not present

## 2023-09-03 DIAGNOSIS — J9601 Acute respiratory failure with hypoxia: Secondary | ICD-10-CM | POA: Diagnosis not present

## 2023-09-03 DIAGNOSIS — Z9981 Dependence on supplemental oxygen: Secondary | ICD-10-CM | POA: Diagnosis not present

## 2023-09-03 DIAGNOSIS — R9431 Abnormal electrocardiogram [ECG] [EKG]: Secondary | ICD-10-CM | POA: Diagnosis not present

## 2023-09-03 DIAGNOSIS — Z7984 Long term (current) use of oral hypoglycemic drugs: Secondary | ICD-10-CM | POA: Insufficient documentation

## 2023-09-03 DIAGNOSIS — I119 Hypertensive heart disease without heart failure: Secondary | ICD-10-CM | POA: Diagnosis not present

## 2023-09-03 DIAGNOSIS — F319 Bipolar disorder, unspecified: Secondary | ICD-10-CM | POA: Diagnosis not present

## 2023-09-03 DIAGNOSIS — R0902 Hypoxemia: Secondary | ICD-10-CM | POA: Diagnosis not present

## 2023-09-03 DIAGNOSIS — J9621 Acute and chronic respiratory failure with hypoxia: Secondary | ICD-10-CM | POA: Diagnosis not present

## 2023-09-03 DIAGNOSIS — E119 Type 2 diabetes mellitus without complications: Secondary | ICD-10-CM | POA: Insufficient documentation

## 2023-09-03 DIAGNOSIS — J439 Emphysema, unspecified: Secondary | ICD-10-CM | POA: Diagnosis not present

## 2023-09-03 DIAGNOSIS — E1165 Type 2 diabetes mellitus with hyperglycemia: Secondary | ICD-10-CM | POA: Diagnosis not present

## 2023-09-03 DIAGNOSIS — R1084 Generalized abdominal pain: Secondary | ICD-10-CM | POA: Diagnosis not present

## 2023-09-03 DIAGNOSIS — E785 Hyperlipidemia, unspecified: Secondary | ICD-10-CM | POA: Diagnosis not present

## 2023-09-03 DIAGNOSIS — N401 Enlarged prostate with lower urinary tract symptoms: Secondary | ICD-10-CM | POA: Diagnosis not present

## 2023-09-03 DIAGNOSIS — N400 Enlarged prostate without lower urinary tract symptoms: Secondary | ICD-10-CM | POA: Diagnosis not present

## 2023-09-03 DIAGNOSIS — K219 Gastro-esophageal reflux disease without esophagitis: Secondary | ICD-10-CM | POA: Diagnosis not present

## 2023-09-03 DIAGNOSIS — E871 Hypo-osmolality and hyponatremia: Secondary | ICD-10-CM | POA: Diagnosis not present

## 2023-09-03 DIAGNOSIS — K5730 Diverticulosis of large intestine without perforation or abscess without bleeding: Secondary | ICD-10-CM | POA: Diagnosis not present

## 2023-09-03 DIAGNOSIS — Z7951 Long term (current) use of inhaled steroids: Secondary | ICD-10-CM | POA: Diagnosis not present

## 2023-09-03 DIAGNOSIS — R059 Cough, unspecified: Secondary | ICD-10-CM | POA: Diagnosis not present

## 2023-09-03 DIAGNOSIS — D649 Anemia, unspecified: Secondary | ICD-10-CM | POA: Diagnosis not present

## 2023-09-03 LAB — COMPREHENSIVE METABOLIC PANEL WITH GFR
ALT: 16 U/L (ref 0–44)
AST: 16 U/L (ref 15–41)
Albumin: 3 g/dL — ABNORMAL LOW (ref 3.5–5.0)
Alkaline Phosphatase: 71 U/L (ref 38–126)
Anion gap: 7 (ref 5–15)
BUN: <5 mg/dL — ABNORMAL LOW (ref 8–23)
CO2: 28 mmol/L (ref 22–32)
Calcium: 9.2 mg/dL (ref 8.9–10.3)
Chloride: 98 mmol/L (ref 98–111)
Creatinine, Ser: 0.69 mg/dL (ref 0.61–1.24)
GFR, Estimated: >60 mL/min (ref >60)
Glucose, Bld: 89 mg/dL (ref 70–99)
Potassium: 4 mmol/L (ref 3.5–5.1)
Sodium: 133 mmol/L — ABNORMAL LOW (ref 135–145)
Total Bilirubin: 0.2 mg/dL (ref 0.0–1.2)
Total Protein: 7.3 g/dL (ref 6.5–8.1)

## 2023-09-03 LAB — CBC
HCT: 39 % (ref 39.0–52.0)
Hemoglobin: 12.5 g/dL — ABNORMAL LOW (ref 13.0–17.0)
MCH: 26.9 pg (ref 26.0–34.0)
MCHC: 32.1 g/dL (ref 30.0–36.0)
MCV: 84.1 fL (ref 80.0–100.0)
Platelets: 367 10*3/uL (ref 150–400)
RBC: 4.64 MIL/uL (ref 4.22–5.81)
RDW: 15.6 % — ABNORMAL HIGH (ref 11.5–15.5)
WBC: 6.5 10*3/uL (ref 4.0–10.5)
nRBC: 0 % (ref 0.0–0.2)

## 2023-09-03 LAB — LIPASE, BLOOD: Lipase: 25 U/L (ref 11–51)

## 2023-09-03 MED ORDER — IOHEXOL 350 MG/ML SOLN
75.0000 mL | Freq: Once | INTRAVENOUS | Status: AC | PRN
  Administered 2023-09-03: 75 mL via INTRAVENOUS

## 2023-09-03 NOTE — ED Provider Triage Note (Signed)
 Emergency Medicine Provider Triage Evaluation Note  Blake Mcknight , a 68 y.o. male  was evaluated in triage.  Pt complains of cough, right back pain, ab pain.   Review of Systems  Positive: cough Negative: Vomiting   Physical Exam  BP 101/62 (BP Location: Left Arm)   Pulse 98   Temp 98.3 F (36.8 C)   Resp 16   Ht 5' 6 (1.676 m)   Wt 70.3 kg   SpO2 98%   BMI 25.02 kg/m  Gen:   Awake, no distress   Resp:  Normal effort  MSK:   Moves extremities without difficulty  Other:    Medical Decision Making  Medically screening exam initiated at 6:24 PM.  Appropriate orders placed.  Blake Mcknight was informed that the remainder of the evaluation will be completed by another provider, this initial triage assessment does not replace that evaluation, and the importance of remaining in the ED until their evaluation is complete.  Blake Mcknight is a 68 y.o. male here with cough, ab pain. Will get labs, CXR, CT ab/pel    Dalene Duck, MD 09/03/23 [phone]

## 2023-09-03 NOTE — ED Triage Notes (Signed)
 Pt coming fro home, cramping sensation in general abd for 2 weeks. Hx of COPD. Denies bloody stools. Pt is always on 2 L

## 2023-09-04 ENCOUNTER — Emergency Department (HOSPITAL_COMMUNITY)

## 2023-09-04 DIAGNOSIS — I2510 Atherosclerotic heart disease of native coronary artery without angina pectoris: Secondary | ICD-10-CM | POA: Diagnosis not present

## 2023-09-04 DIAGNOSIS — R918 Other nonspecific abnormal finding of lung field: Secondary | ICD-10-CM | POA: Diagnosis not present

## 2023-09-04 DIAGNOSIS — J439 Emphysema, unspecified: Secondary | ICD-10-CM | POA: Diagnosis not present

## 2023-09-04 DIAGNOSIS — R109 Unspecified abdominal pain: Secondary | ICD-10-CM | POA: Diagnosis not present

## 2023-09-04 LAB — TROPONIN I (HIGH SENSITIVITY)
Troponin I (High Sensitivity): 5 ng/L (ref <18)
Troponin I (High Sensitivity): 3 ng/L (ref <18)

## 2023-09-04 MED ORDER — IPRATROPIUM-ALBUTEROL 0.5-2.5 (3) MG/3ML IN SOLN
3.0000 mL | Freq: Once | RESPIRATORY_TRACT | Status: AC
  Administered 2023-09-04: 3 mL via RESPIRATORY_TRACT
  Filled 2023-09-04: qty 3

## 2023-09-04 MED ORDER — IOHEXOL 350 MG/ML SOLN
75.0000 mL | Freq: Once | INTRAVENOUS | Status: AC | PRN
  Administered 2023-09-04: 75 mL via INTRAVENOUS

## 2023-09-04 NOTE — ED Provider Notes (Signed)
 [place] EMERGENCY DEPARTMENT AT [facility] Provider Note   CSN: [id number] Arrival date & time: 09/03/23  1529     Patient presents with: Abdominal Pain   Blake Mcknight is a 68 y.o. male.  Patient with past medical history significant for schizophrenia, COPD exacerbation, mass of the right lung presents emergency department complaining of back pain, vague shortness of breath, initially abdominal pain.  At the time of my assessment, approximately 10 hours after arrival, patient no longer complained of abdominal pain, shortness of breath, or back pain.  Family states that he earlier had some discomfort in the epigastric region.  He does have a known T7 compression fracture as well.    Abdominal Pain      Prior to Admission medications   Medication Sig Start Date End Date Taking? Authorizing Provider  acetaminophen  (TYLENOL ) 500 MG tablet Take 2 tablets (1,000 mg total) by mouth every 8 (eight) hours. 08/14/23   Fausto Sor A, DO  albuterol  (PROVENTIL ) (2.5 MG/3ML) 0.083% nebulizer solution Take 3 mLs (2.5 mg total) by nebulization every 4 (four) hours as needed for wheezing or shortness of breath. 08/13/23   Fausto Sor A, DO  albuterol  (VENTOLIN  HFA) 108 (90 Base) MCG/ACT inhaler Inhale 2 puffs into the lungs every 4 (four) hours as needed for wheezing or shortness of breath. 10/02/22   Steinl, Kevin, MD  amLODipine  (NORVASC ) 5 MG tablet Take 5 mg by mouth daily. 06/26/21   [provider]  atorvastatin  (LIPITOR) 10 MG tablet Take 1 tablet (10 mg total) by mouth at bedtime. 08/14/23   Fausto Sor LABOR, DO  benztropine  (COGENTIN ) 2 MG tablet Take 1 tablet (2 mg total) by mouth 2 (two) times daily. 07/30/23   Arfeen, Leni DASEN, MD  budesonide  (PULMICORT ) 0.25 MG/2ML nebulizer solution Take 2 mLs (0.25 mg total) by nebulization 2 (two) times daily. 08/14/23 09/28/23  Fausto Sor LABOR, DO  budesonide -glycopyrrolate -formoterol  (BREZTRI AEROSPHERE) 160-9-4.8 MCG/ACT  AERO inhaler Inhale 1-2 puffs into the lungs 2 (two) times daily.    [provider]  carvedilol  (COREG ) 3.125 MG tablet Take 1 tablet (3.125 mg total) by mouth 2 (two) times daily with a meal. 02/20/19   Cheryle Page, MD  cetirizine (ZYRTEC) 10 MG tablet Take 10 mg by mouth daily as needed for allergies or rhinitis.    [provider]  cholecalciferol (VITAMIN D) 25 MCG (1000 UNIT) tablet Take 5,000 Units by mouth every morning.    [provider]  famotidine  (PEPCID ) 40 MG tablet Take 40 mg by mouth 2 (two) times daily. 12/28/18   [provider]  feeding supplement, GLUCERNA SHAKE, (GLUCERNA SHAKE) LIQD Take 237 mLs by mouth 3 (three) times daily between meals. 08/14/23   Fausto Sor LABOR, DO  ibuprofen  (ADVIL ) 400 MG tablet Take 1 tablet (400 mg total) by mouth every 6 (six) hours as needed (moderate to severe pain). 08/14/23   Fausto Sor LABOR, DO  ipratropium-albuterol  (DUONEB) 0.5-2.5 (3) MG/3ML SOLN Take 3 mLs by nebulization every 4 (four) hours. 08/14/23   Fausto Sor A, DO  JANUVIA 100 MG tablet Take 100 mg by mouth daily. 01/30/23   [provider]  lidocaine  (LIDODERM ) 5 % Place 1 patch onto the skin daily. Remove & Discard patch within 12 hours or as directed by MD 08/14/23   Fausto Sor A, DO  melatonin 5 MG TABS Take 1 tablet (5 mg total) by mouth at bedtime as needed. 08/14/23   Fausto Sor LABOR, DO  methocarbamol  (ROBAXIN ) 750 MG tablet Take 1 tablet (750 mg total) by mouth every 6 (six) hours as needed for muscle spasms. 08/14/23   Fausto Burnard LABOR, DO  mirtazapine  (REMERON ) 30 MG tablet Take 1 tablet (30 mg total) by mouth at bedtime as needed (insomnia). Patient taking differently: Take 30 mg by mouth at bedtime. 07/30/23 07/29/24  Arfeen, Leni DASEN, MD  multivitamin (ONE-A-DAY MEN'S) TABS tablet Take 1 tablet by mouth daily. Patient not taking: Reported on 08/09/2023    [provider]  OXYGEN  Inhale 2 L/min into the lungs  continuous.     [provider]  polyethylene glycol (MIRALAX  / GLYCOLAX ) 17 g packet Take 17 g by mouth daily as needed for mild constipation.    [provider]  potassium chloride  SA (KLOR-CON  M) 20 MEQ tablet Take 1 tablet (20 mEq total) by mouth 2 (two) times daily. 08/14/23   Fausto Burnard LABOR, DO  predniSONE  (DELTASONE ) 10 MG tablet Take 10 mg by mouth daily with breakfast. Patient not taking: Reported on 09/02/2023    [provider]  risperiDONE  (RISPERDAL ) 3 MG tablet Take 1 tablet (3 mg total) by mouth 2 (two) times daily. 07/30/23   Arfeen, Leni DASEN, MD  sodium chloride  (OCEAN) 0.65 % SOLN nasal spray Place 1 spray into both nostrils as needed for congestion.    [provider]  tamsulosin  (FLOMAX ) 0.4 MG CAPS capsule Take 0.4 mg by mouth in the morning and at bedtime. 07/23/20   [provider]  traMADol  (ULTRAM ) 50 MG tablet Take 1 tablet (50 mg total) by mouth every 6 (six) hours as needed (pain not controlled with Tylenol , ibuprofen  and Robaxin ). 08/14/23   Fausto Burnard A, DO    Allergies: Patient has no known allergies.    Review of Systems  Gastrointestinal:  Positive for abdominal pain.    Updated Vital Signs BP 127/78   Pulse 97   Temp (!) 97.4 F (36.3 C)   Resp 18   Ht 5' 6 (1.676 m)   Wt 70.3 kg   SpO2 100%   BMI 25.02 kg/m   Physical Exam Vitals and nursing note reviewed.  Constitutional:      General: He is not in acute distress.    Appearance: He is well-developed.  HENT:     Head: Normocephalic and atraumatic.   Eyes:     Conjunctiva/sclera: Conjunctivae normal.    Cardiovascular:     Rate and Rhythm: Normal rate and regular rhythm.  Pulmonary:     Effort: Pulmonary effort is normal. No respiratory distress.     Breath sounds: Normal breath sounds.     Comments: Patient initially had very minimal wheezes and bilateral lower lobes which completely subsided after albuterol  Abdominal:     Palpations:  Abdomen is soft.     Tenderness: There is no abdominal tenderness. There is no right CVA tenderness or left CVA tenderness.   Musculoskeletal:        General: No swelling.     Cervical back: Neck supple.       Back:   Skin:    General: Skin is warm and dry.     Capillary Refill: Capillary refill takes less than 2 seconds.   Neurological:     General: No focal deficit present.     Mental Status: He is alert.     Sensory: No sensory deficit.     Motor: No weakness.   Psychiatric:  Mood and Affect: Mood normal.     (all labs ordered are listed, but only abnormal results are displayed) Labs Reviewed  COMPREHENSIVE METABOLIC PANEL WITH GFR - Abnormal; Notable for the following components:      Result Value   Sodium 133 (*)    BUN <5 (*)    Albumin 3.0 (*)    All other components within normal limits  CBC - Abnormal; Notable for the following components:   Hemoglobin 12.5 (*)    RDW 15.6 (*)    All other components within normal limits  LIPASE, BLOOD  TROPONIN I (HIGH SENSITIVITY)  TROPONIN I (HIGH SENSITIVITY)    EKG: EKG Interpretation Date/Time:  Friday September 04 2023 05:11:15 EDT Ventricular Rate:  97 PR Interval:  139 QRS Duration:  84 QT Interval:  330 QTC Calculation: 420 R Axis:   88  Text Interpretation: Sinus rhythm Borderline right axis deviation Probable anteroseptal infarct, old No significant change was found Confirmed by Carita Senior [phone]) on 09/04/2023 5:28:57 AM  Radiology: CT Angio Chest PE W and/or Wo Contrast Result Date: 09/04/2023 CLINICAL DATA:  68 year old male with abdomen pain, known posterior right lower lung mass partially visible on CT Abdomen and Pelvis yesterday. EXAM: CT ANGIOGRAPHY CHEST WITH CONTRAST TECHNIQUE: Multidetector CT imaging of the chest was performed using the standard protocol during bolus administration of intravenous contrast. Multiplanar CT image reconstructions and MIPs were obtained to evaluate the vascular  anatomy. RADIATION DOSE REDUCTION: This exam was performed according to the departmental dose-optimization program which includes automated exposure control, adjustment of the mA and/or kV according to patient size and/or use of iterative reconstruction technique. CONTRAST:  75mL OMNIPAQUE  IOHEXOL  350 MG/ML SOLN COMPARISON:  CT Abdomen and Pelvis yesterday. CTA chest 08/09/2023 and earlier. FINDINGS: Cardiovascular: Excellent contrast bolus timing in the pulmonary arterial tree. Mild mixing artifact in the main pulmonary arteries. No pulmonary artery thrombus or filling defect identified. Calcified aortic atherosclerosis. Calcified coronary artery atherosclerosis. Heart size remains normal. No pericardial effusion. Mediastinum/Nodes: Mildly enlarged subcarinal soft tissue mass since 08/09/2023 (up to 2.9 cm short axis now, 2.6 cm previously) appears to be lymphadenopathy or ex nodal disease eccentric to the right. Other right hilar lymph node tissue up to 13 mm short axis is stable on series 4, image 72. No contralateral left hilar lymphadenopathy. No superior mediastinal lymphadenopathy. Lungs/Pleura: Severe emphysema. Extensive bullous disease and architectural distortion bilaterally. Confluent secretions along the right lateral wall of the trachea, retained secretions in the bilateral mainstem bronchi. Ongoing right lower lobe masslike peribronchial opacity, consolidative opacity. This measures up to 6.3 cm long axis, size and configuration appears stable since 08/09/2023. No significant pleural effusion. No new pulmonary abnormality identified. Upper Abdomen: Stable visible noncontrast upper abdominal viscera including cholecystectomy. No upper abdominal free air or free fluid. Musculoskeletal: Osteopenia. T7 compression fracture was present last month but has per progressed to vertebra plana now (series 8, image 92). No significant retropulsion. No destructive osseous lesion identified. Other vertebral height  maintained. No acute osseous abnormality identified. Review of the MIP images confirms the above findings. IMPRESSION: 1. Negative for acute pulmonary embolus. 2. Masslike peribronchial consolidation in the right lower lobe not significantly changed since 08/09/2023 but mildly enlarged subcarinal lymphadenopathy and/or ex nodal mass since that time, now up to 2.9 cm short axis. Constellation highly suspicious for bronchogenic carcinoma with nodal metastases. Severe underlying Emphysema (ICD10-J43.9). Recommend referral to [facility]-Disciplinary [facility]cology Clinic [facility]) if not already done. 3. Osteopenia. T7 compression fracture last  month has progressed to vertebra plana. No obvious skeletal metastasis by CT. 4.  Aortic Atherosclerosis (ICD10-I70.0). Electronically Signed   By: VEAR Hurst M.D.   On: 09/04/2023 06:00   CT ABDOMEN PELVIS W CONTRAST Result Date: 09/03/2023 CLINICAL DATA:  Abdominal cramping for 2 weeks. EXAM: CT ABDOMEN AND PELVIS WITH CONTRAST TECHNIQUE: Multidetector CT imaging of the abdomen and pelvis was performed using the standard protocol following bolus administration of intravenous contrast. RADIATION DOSE REDUCTION: This exam was performed according to the departmental dose-optimization program which includes automated exposure control, adjustment of the mA and/or kV according to patient size and/or use of iterative reconstruction technique. CONTRAST:  75mL OMNIPAQUE  IOHEXOL  350 MG/ML SOLN COMPARISON:  Aug 10, 2023 FINDINGS: Lower chest: Extensive emphysematous lung disease is seen within the bilateral lung bases with a stable masslike area seen within the lower right lung. A small right pleural effusion is also present. Hepatobiliary: There is diffuse fatty infiltration of the liver. A 2.3 cm x 1.8 cm area of low attenuation and thin peripheral rim of vascularity is seen within the right lobe of the liver (Segment VI). Status post cholecystectomy. No biliary dilatation. Pancreas:  Unremarkable. No pancreatic ductal dilatation or surrounding inflammatory changes. Spleen: Normal in size without focal abnormality. Adrenals/Urinary Tract: Adrenal glands are unremarkable. Kidneys are normal in size, without renal calculi or hydronephrosis. Several bilateral simple renal cysts are seen. Bladder is unremarkable. Stomach/Bowel: Stomach is within normal limits. Appendix appears normal. A 1.8 cm well-defined, intraluminal, mildly hyperdense area (approximately 153.23 Hounsfield units) is seen along the wall of the duodenal bulb (axial CT image 22, CT series 2/coronal reformatted images 58 through 61, CT series 5). No evidence of bowel dilatation or inflammatory changes. Noninflamed diverticula are seen throughout the descending and sigmoid colon. Vascular/Lymphatic: Aortic atherosclerosis. No enlarged abdominal or pelvic lymph nodes. Reproductive: The prostate gland is mildly enlarged. Other: No abdominal wall hernia or abnormality. No abdominopelvic ascites. Musculoskeletal: No acute or significant osseous findings. IMPRESSION: 1. Extensive emphysematous lung disease with a stable mass-like area within the lower right lung. 2. Small right pleural effusion. 3. Findings likely consistent with a hemangioma within the right lobe of the liver. Correlation with nonemergent hepatic ultrasound is recommended. 4. Findings which may represent a soft tissue mass within the duodenal bulb, as described above. GI consultation and subsequent endoscopies recommended to further exclude the presence of an underlying neoplastic process. 5. Colonic diverticulosis. 6. Bilateral simple renal cysts. No follow-up imaging is recommended. This recommendation follows ACR consensus guidelines: Management of the Incidental Renal Mass on CT: A White Paper of the ACR Incidental Findings Committee. J Am Coll Radiol [phone]. 7. Aortic atherosclerosis. Electronically Signed   By: Suzen Dials M.D.   On: 09/03/2023 20:02    DG Chest 2 View Result Date: 09/03/2023 CLINICAL DATA:  Cough, COPD EXAM: CHEST - 2 VIEW COMPARISON:  08/09/2023, CT 08/18/2023 FINDINGS: Severe emphysema. Stable consolidation within the posteromedial right lower lobe. Small right pleural effusion has developed. Left lung is clear. No pneumothorax. No pleural effusion on the left. Cardiac size within normal limits. No acute bone abnormality. IMPRESSION: 1. Interval development of a small right pleural effusion. 2. Stable right lower lobe consolidation. Electronically Signed   By: Dorethia Molt M.D.   On: 09/03/2023 19:42     Procedures   Medications Ordered in the ED  iohexol  (OMNIPAQUE ) 350 MG/ML injection 75 mL (75 mLs Intravenous Contrast Given 09/03/23 1941)  ipratropium-albuterol  (DUONEB) 0.5-2.5 (3) MG/3ML nebulizer solution  3 mL (3 mLs Nebulization Given 09/04/23 0509)  iohexol  (OMNIPAQUE ) 350 MG/ML injection 75 mL (75 mLs Intravenous Contrast Given 09/04/23 0543)                                    Medical Decision Making Amount and/or Complexity of Data Reviewed Labs: ordered. Radiology: ordered. ECG/medicine tests: ordered.  Risk Prescription drug management.   This patient presents to the ED for concern of back pain, this involves an extensive number of treatment options, and is a complaint that carries with it a high risk of complications and morbidity.  The differential diagnosis includes fracture, sensation of soft tissue injury, others.  Patient also complains of some vague abdominal discomfort   Co morbidities / Chronic conditions that complicate the patient evaluation  Known lung mass, schizophrenia   Additional history obtained:  Additional history obtained from EMR External records from outside source obtained and reviewed including pulmonology notes, noting lung mass, nonoperable   Lab Tests:  I Ordered, and personally interpreted labs.  The pertinent results include: Grossly unremarkable CBC, CMP.   Troponin 5   Imaging Studies ordered:  I ordered imaging studies including CT angio chest PE study, CT abdomen pelvis with contrast, chest x-ray I independently visualized and interpreted imaging which showed  1. Negative for acute pulmonary embolus.    2. Masslike peribronchial consolidation in the right lower lobe not  significantly changed since 08/09/2023 but mildly enlarged  subcarinal lymphadenopathy and/or ex nodal mass since that time, now  up to 2.9 cm short axis.  Constellation highly suspicious for bronchogenic carcinoma with  nodal metastases.  Severe underlying Emphysema (ICD10-J43.9).  Recommend referral to Multi-Disciplinary Thoracic Oncology Clinic  [facility]) if not already done.    3. Osteopenia. T7 compression fracture last month has progressed to  vertebra plana. No obvious skeletal metastasis by CT.    4.  Aortic Atherosclerosis    1. Extensive emphysematous lung disease with a stable mass-like area  within the lower right lung.  2. Small right pleural effusion.  3. Findings likely consistent with a hemangioma within the right  lobe of the liver. Correlation with nonemergent hepatic ultrasound  is recommended.  4. Findings which may represent a soft tissue mass within the  duodenal bulb, as described above. GI consultation and subsequent  endoscopies recommended to further exclude the presence of an  underlying neoplastic process.  5. Colonic diverticulosis.  6. Bilateral simple renal cysts. No follow-up imaging is  recommended. This recommendation follows ACR consensus guidelines:  Management of the Incidental Renal Mass on CT: A White Paper of the  ACR Incidental Findings Committee. J Am Coll Radiol [phone].  7. Aortic atherosclerosis.   1. Interval development of a small right pleural effusion.  2. Stable right lower lobe consolidation.   I agree with the radiologist interpretation   Cardiac Monitoring: / EKG:  The patient was maintained on  a cardiac monitor.  I personally viewed and interpreted the cardiac monitored which showed an underlying rhythm of: Sinus rhythm   Problem List / ED Course / Critical interventions / Medication management   I ordered medication including DuoNeb Reevaluation of the patient after these medicines showed that the patient improved I have reviewed the patients home medicines and have made adjustments as needed   Social Determinants of Health:  Patient is a former smoker   Test / Admission - Considered:  Patient with  no acute findings on imaging to explain his vague abdominal pain.  At the time of my assessment he is asymptomatic.  Palpation of his spine does produce some tenderness near the T7 region where there is a known compression fracture.   no sign of ACS, PE, pneumonia.  No abdominal CT grossly unremarkable except for incidental finding with recommendation for GI follow-up.  Patient and his brother have been informed of findings including CT findings of the chest showing the right sided lung mass, currently followed by pulmonology, and CT findings of the abdomen showing possible mass in the esophageal region with recommendations for GI follow-up.  They voiced understanding of plan.  The patient was able to ambulate without oxygen  desaturation.  Patient stable for discharge home at this time.      Final diagnoses:  Closed wedge compression fracture of T7 vertebra with routine healing, subsequent encounter    ED Discharge Orders     None          Logan Ubaldo KATHEE DEVONNA 09/04/23 [id number]    Carita Senior, MD 09/05/23 1023

## 2023-09-04 NOTE — ED Notes (Signed)
 SpO2 ambulatory trial complete. Pt ambulated on baseline 2L nasal cannula. Pt's SpO2 went from 100% to 96% with ambulation. MD Rancour notified.

## 2023-09-04 NOTE — Discharge Instructions (Addendum)
 Your workup tonight showed no acute cause of your symptoms.  You should follow-up with both gastroenterology for further evaluation of your abdominal CT findings and pulmonology for further evaluation of your chest CT findings as listed below.  I have listed the contact information for the gastroenterologist for follow-up.  Your back pain is likely due to your T7 compression fracture.  If you develop any life-threatening symptoms please return to the emergency department.  Your chest CT showed: IMPRESSION:  1. Negative for acute pulmonary embolus.    2. Masslike peribronchial consolidation in the right lower lobe not  significantly changed since 08/09/2023 but mildly enlarged  subcarinal lymphadenopathy and/or ex nodal mass since that time, now  up to 2.9 cm short axis.  Constellation highly suspicious for bronchogenic carcinoma with  nodal metastases.  Severe underlying Emphysema (ICD10-J43.9).  Recommend referral to Multi-Disciplinary Thoracic Oncology Clinic  [facility]) if not already done.    3. Osteopenia. T7 compression fracture last month has progressed to  vertebra plana. No obvious skeletal metastasis by CT.    4.  Aortic Atherosclerosis   Your abdominal and pelvic CT showed: IMPRESSION:  1. Extensive emphysematous lung disease with a stable mass-like area  within the lower right lung.  2. Small right pleural effusion.  3. Findings likely consistent with a hemangioma within the right  lobe of the liver. Correlation with nonemergent hepatic ultrasound  is recommended.  4. Findings which may represent a soft tissue mass within the  duodenal bulb, as described above. GI consultation and subsequent  endoscopies recommended to further exclude the presence of an  underlying neoplastic process.  5. Colonic diverticulosis.  6. Bilateral simple renal cysts. No follow-up imaging is  recommended. This recommendation follows ACR consensus guidelines:  Management of the Incidental Renal  Mass on CT: A White Paper of the  ACR Incidental Findings Committee. J Am Coll Radiol [phone].  7. Aortic atherosclerosis.

## 2023-09-04 NOTE — ED Provider Notes (Cosign Needed)
 Accepted handoff at shift change from [facility]. Please see prior provider note for more detail.   Briefly: Patient is 68 y.o. presenting for abdominal pain  DDX: concern for fracture, sensation of soft tissue injury, others   Plan: fracture, sensation of soft tissue injury, others   Physical Exam  BP (!) 119/94   Pulse 93   Temp (!) 97.4 F (36.3 C)   Resp 19   Ht 5' 6 (1.676 m)   Wt 70.3 kg   SpO2 100%   BMI 25.02 kg/m   Physical Exam  Procedures  Procedures  ED Course / MDM   Clinical Course as of 09/04/23 0815  Fri Sep 04, 2023  0634 Can dc if 2nd trop is ok. Here for abdominal pain.  [JR]    Clinical Course User Index [JR] Janalee Mcmurray, PA-C   Medical Decision Making Amount and/or Complexity of Data Reviewed Labs: ordered. Radiology: ordered. ECG/medicine tests: ordered.  Risk Prescription drug management.    On reassessment, remains well-appearing, symptoms improved.  Second troponin was negative.  Discussed return cautions.  Discharged good condition.      Janalee Mcmurray, PA-C 09/04/23 [phone]

## 2023-09-06 ENCOUNTER — Other Ambulatory Visit: Payer: Self-pay

## 2023-09-06 ENCOUNTER — Encounter (HOSPITAL_COMMUNITY): Payer: Self-pay

## 2023-09-06 ENCOUNTER — Emergency Department (HOSPITAL_COMMUNITY)

## 2023-09-06 ENCOUNTER — Inpatient Hospital Stay (HOSPITAL_COMMUNITY)
Admission: EM | Admit: 2023-09-06 | Discharge: 2023-09-10 | DRG: 189 | Disposition: A | Discharge status: Home / Self Care | Attending: Internal Medicine | Admitting: Internal Medicine

## 2023-09-06 DIAGNOSIS — E1169 Type 2 diabetes mellitus with other specified complication: Secondary | ICD-10-CM

## 2023-09-06 DIAGNOSIS — Z9981 Dependence on supplemental oxygen: Secondary | ICD-10-CM

## 2023-09-06 DIAGNOSIS — Z8249 Family history of ischemic heart disease and other diseases of the circulatory system: Secondary | ICD-10-CM | POA: Diagnosis not present

## 2023-09-06 DIAGNOSIS — Z7984 Long term (current) use of oral hypoglycemic drugs: Secondary | ICD-10-CM | POA: Diagnosis not present

## 2023-09-06 DIAGNOSIS — Z66 Do not resuscitate: Secondary | ICD-10-CM | POA: Diagnosis present

## 2023-09-06 DIAGNOSIS — S22060A Wedge compression fracture of T7-T8 vertebra, initial encounter for closed fracture: Secondary | ICD-10-CM | POA: Diagnosis present

## 2023-09-06 DIAGNOSIS — E871 Hypo-osmolality and hyponatremia: Secondary | ICD-10-CM | POA: Diagnosis present

## 2023-09-06 DIAGNOSIS — Z7951 Long term (current) use of inhaled steroids: Secondary | ICD-10-CM

## 2023-09-06 DIAGNOSIS — Z9049 Acquired absence of other specified parts of digestive tract: Secondary | ICD-10-CM | POA: Diagnosis not present

## 2023-09-06 DIAGNOSIS — Z833 Family history of diabetes mellitus: Secondary | ICD-10-CM | POA: Diagnosis not present

## 2023-09-06 DIAGNOSIS — Z85118 Personal history of other malignant neoplasm of bronchus and lung: Secondary | ICD-10-CM | POA: Diagnosis not present

## 2023-09-06 DIAGNOSIS — K219 Gastro-esophageal reflux disease without esophagitis: Secondary | ICD-10-CM | POA: Diagnosis present

## 2023-09-06 DIAGNOSIS — J90 Pleural effusion, not elsewhere classified: Secondary | ICD-10-CM | POA: Diagnosis present

## 2023-09-06 DIAGNOSIS — E785 Hyperlipidemia, unspecified: Secondary | ICD-10-CM

## 2023-09-06 DIAGNOSIS — N400 Enlarged prostate without lower urinary tract symptoms: Secondary | ICD-10-CM

## 2023-09-06 DIAGNOSIS — J439 Emphysema, unspecified: Secondary | ICD-10-CM | POA: Diagnosis present

## 2023-09-06 DIAGNOSIS — I10 Essential (primary) hypertension: Secondary | ICD-10-CM | POA: Diagnosis present

## 2023-09-06 DIAGNOSIS — F203 Undifferentiated schizophrenia: Secondary | ICD-10-CM | POA: Diagnosis not present

## 2023-09-06 DIAGNOSIS — K3189 Other diseases of stomach and duodenum: Secondary | ICD-10-CM | POA: Diagnosis present

## 2023-09-06 DIAGNOSIS — E878 Other disorders of electrolyte and fluid balance, not elsewhere classified: Secondary | ICD-10-CM | POA: Diagnosis present

## 2023-09-06 DIAGNOSIS — X58XXXA Exposure to other specified factors, initial encounter: Secondary | ICD-10-CM | POA: Diagnosis present

## 2023-09-06 DIAGNOSIS — E119 Type 2 diabetes mellitus without complications: Secondary | ICD-10-CM | POA: Diagnosis present

## 2023-09-06 DIAGNOSIS — Z87891 Personal history of nicotine dependence: Secondary | ICD-10-CM

## 2023-09-06 DIAGNOSIS — J441 Chronic obstructive pulmonary disease with (acute) exacerbation: Secondary | ICD-10-CM | POA: Diagnosis present

## 2023-09-06 DIAGNOSIS — D649 Anemia, unspecified: Secondary | ICD-10-CM | POA: Diagnosis present

## 2023-09-06 DIAGNOSIS — Z515 Encounter for palliative care: Secondary | ICD-10-CM

## 2023-09-06 DIAGNOSIS — J9601 Acute respiratory failure with hypoxia: Secondary | ICD-10-CM | POA: Diagnosis not present

## 2023-09-06 DIAGNOSIS — J9621 Acute and chronic respiratory failure with hypoxia: Secondary | ICD-10-CM | POA: Diagnosis present

## 2023-09-06 DIAGNOSIS — Z79899 Other long term (current) drug therapy: Secondary | ICD-10-CM

## 2023-09-06 DIAGNOSIS — R918 Other nonspecific abnormal finding of lung field: Secondary | ICD-10-CM | POA: Diagnosis not present

## 2023-09-06 DIAGNOSIS — F209 Schizophrenia, unspecified: Secondary | ICD-10-CM | POA: Diagnosis present

## 2023-09-06 DIAGNOSIS — R0602 Shortness of breath: Secondary | ICD-10-CM | POA: Diagnosis not present

## 2023-09-06 LAB — BRAIN NATRIURETIC PEPTIDE: B Natriuretic Peptide: 28.2 pg/mL (ref 0.0–100.0)

## 2023-09-06 LAB — BASIC METABOLIC PANEL WITH GFR
Anion gap: 14 (ref 5–15)
BUN: <5 mg/dL — ABNORMAL LOW (ref 8–23)
CO2: 22 mmol/L (ref 22–32)
Calcium: 9.1 mg/dL (ref 8.9–10.3)
Chloride: 97 mmol/L — ABNORMAL LOW (ref 98–111)
Creatinine, Ser: 0.64 mg/dL (ref 0.61–1.24)
GFR, Estimated: >60 mL/min (ref >60)
Glucose, Bld: 153 mg/dL — ABNORMAL HIGH (ref 70–99)
Potassium: 4 mmol/L (ref 3.5–5.1)
Sodium: 133 mmol/L — ABNORMAL LOW (ref 135–145)

## 2023-09-06 LAB — CBC WITH DIFFERENTIAL/PLATELET
Abs Immature Granulocytes: 0.02 10*3/uL (ref 0.00–0.07)
Basophils Absolute: 0.1 10*3/uL (ref 0.0–0.1)
Basophils Relative: 1 %
Eosinophils Absolute: 0.4 10*3/uL (ref 0.0–0.5)
Eosinophils Relative: 6 %
HCT: 36.5 % — ABNORMAL LOW (ref 39.0–52.0)
Hemoglobin: 12 g/dL — ABNORMAL LOW (ref 13.0–17.0)
Immature Granulocytes: 0 %
Lymphocytes Relative: 19 %
Lymphs Abs: 1.4 10*3/uL (ref 0.7–4.0)
MCH: 27.3 pg (ref 26.0–34.0)
MCHC: 32.9 g/dL (ref 30.0–36.0)
MCV: 83.1 fL (ref 80.0–100.0)
Monocytes Absolute: 1 10*3/uL (ref 0.1–1.0)
Monocytes Relative: 14 %
Neutro Abs: 4.3 10*3/uL (ref 1.7–7.7)
Neutrophils Relative %: 60 %
Platelets: 359 10*3/uL (ref 150–400)
RBC: 4.39 MIL/uL (ref 4.22–5.81)
RDW: 15.7 % — ABNORMAL HIGH (ref 11.5–15.5)
WBC: 7.2 10*3/uL (ref 4.0–10.5)
nRBC: 0 % (ref 0.0–0.2)

## 2023-09-06 LAB — CBG MONITORING, ED
Glucose-Capillary: 125 mg/dL — ABNORMAL HIGH (ref 70–99)
Glucose-Capillary: 179 mg/dL — ABNORMAL HIGH (ref 70–99)

## 2023-09-06 LAB — I-STAT VENOUS BLOOD GAS, ED
Acid-Base Excess: 2 mmol/L (ref 0.0–2.0)
Bicarbonate: 26.6 mmol/L (ref 20.0–28.0)
Calcium, Ion: 1.16 mmol/L (ref 1.15–1.40)
HCT: 36 % — ABNORMAL LOW (ref 39.0–52.0)
Hemoglobin: 12.2 g/dL — ABNORMAL LOW (ref 13.0–17.0)
O2 Saturation: 100 %
Potassium: 4 mmol/L (ref 3.5–5.1)
Sodium: 132 mmol/L — ABNORMAL LOW (ref 135–145)
TCO2: 28 mmol/L (ref 22–32)
pCO2, Ven: 42.8 mmHg — ABNORMAL LOW (ref 44–60)
pH, Ven: 7.402 (ref 7.25–7.43)
pO2, Ven: 189 mmHg — ABNORMAL HIGH (ref 32–45)

## 2023-09-06 LAB — PROCALCITONIN: Procalcitonin: <0.1 ng/mL

## 2023-09-06 MED ORDER — AMLODIPINE BESYLATE 5 MG PO TABS
5.0000 mg | ORAL_TABLET | Freq: Every day | ORAL | Status: DC
  Administered 2023-09-06 – 2023-09-10 (×5): 5 mg via ORAL
  Filled 2023-09-06 (×6): qty 1

## 2023-09-06 MED ORDER — SODIUM CHLORIDE 0.9% FLUSH
3.0000 mL | Freq: Two times a day (BID) | INTRAVENOUS | Status: DC
Start: 2023-09-06 — End: 2023-09-08
  Administered 2023-09-06 – 2023-09-08 (×5): 3 mL via INTRAVENOUS

## 2023-09-06 MED ORDER — INSULIN ASPART 100 UNIT/ML IJ SOLN
0.0000 [IU] | Freq: Three times a day (TID) | INTRAMUSCULAR | Status: DC
  Administered 2023-09-06: 2 [IU] via SUBCUTANEOUS
  Administered 2023-09-06 – 2023-09-07 (×2): 1 [IU] via SUBCUTANEOUS
  Administered 2023-09-07: 2 [IU] via SUBCUTANEOUS
  Administered 2023-09-07: 1 [IU] via SUBCUTANEOUS
  Administered 2023-09-08: 3 [IU] via SUBCUTANEOUS

## 2023-09-06 MED ORDER — SODIUM CHLORIDE 0.9 % IV SOLN
1.0000 g | INTRAVENOUS | Status: DC
  Administered 2023-09-06 – 2023-09-08 (×3): 1 g via INTRAVENOUS
  Filled 2023-09-06 (×3): qty 10

## 2023-09-06 MED ORDER — ATORVASTATIN CALCIUM 10 MG PO TABS
10.0000 mg | ORAL_TABLET | Freq: Every day | ORAL | Status: DC
  Administered 2023-09-06 – 2023-09-09 (×4): 10 mg via ORAL
  Filled 2023-09-06 (×5): qty 1

## 2023-09-06 MED ORDER — GUAIFENESIN ER 600 MG PO TB12
600.0000 mg | ORAL_TABLET | Freq: Two times a day (BID) | ORAL | Status: DC
  Administered 2023-09-06 – 2023-09-10 (×9): 600 mg via ORAL
  Filled 2023-09-06 (×11): qty 1

## 2023-09-06 MED ORDER — ARFORMOTEROL TARTRATE 15 MCG/2ML IN NEBU
15.0000 ug | INHALATION_SOLUTION | Freq: Two times a day (BID) | RESPIRATORY_TRACT | Status: DC
  Administered 2023-09-06 – 2023-09-10 (×9): 15 ug via RESPIRATORY_TRACT
  Filled 2023-09-06 (×12): qty 2

## 2023-09-06 MED ORDER — TRAMADOL HCL 50 MG PO TABS
50.0000 mg | ORAL_TABLET | Freq: Four times a day (QID) | ORAL | Status: DC | PRN
  Administered 2023-09-09 – 2023-09-10 (×3): 50 mg via ORAL
  Filled 2023-09-06 (×5): qty 1

## 2023-09-06 MED ORDER — FAMOTIDINE IN NACL 20-0.9 MG/50ML-% IV SOLN
20.0000 mg | Freq: Once | INTRAVENOUS | Status: AC
  Administered 2023-09-06: 20 mg via INTRAVENOUS
  Filled 2023-09-06: qty 50

## 2023-09-06 MED ORDER — ACETAMINOPHEN 325 MG PO TABS: 650.0000 mg | ORAL_TABLET | Freq: Four times a day (QID) | ORAL | Status: DC | PRN

## 2023-09-06 MED ORDER — TAMSULOSIN HCL 0.4 MG PO CAPS
0.4000 mg | ORAL_CAPSULE | Freq: Two times a day (BID) | ORAL | Status: DC
  Administered 2023-09-06 – 2023-09-10 (×8): 0.4 mg via ORAL
  Filled 2023-09-06 (×10): qty 1

## 2023-09-06 MED ORDER — ACETAMINOPHEN 650 MG RE SUPP: 650.0000 mg | Freq: Four times a day (QID) | RECTAL | Status: DC | PRN

## 2023-09-06 MED ORDER — MIRTAZAPINE 15 MG PO TABS
30.0000 mg | ORAL_TABLET | Freq: Every day | ORAL | Status: DC
  Administered 2023-09-06 – 2023-09-07 (×2): 30 mg via ORAL
  Filled 2023-09-06 (×2): qty 2

## 2023-09-06 MED ORDER — IPRATROPIUM-ALBUTEROL 0.5-2.5 (3) MG/3ML IN SOLN
3.0000 mL | Freq: Once | RESPIRATORY_TRACT | Status: AC
  Administered 2023-09-06: 3 mL via RESPIRATORY_TRACT
  Filled 2023-09-06: qty 3

## 2023-09-06 MED ORDER — CARVEDILOL 3.125 MG PO TABS
3.1250 mg | ORAL_TABLET | Freq: Two times a day (BID) | ORAL | Status: DC
  Administered 2023-09-06 – 2023-09-10 (×8): 3.125 mg via ORAL
  Filled 2023-09-06 (×10): qty 1

## 2023-09-06 MED ORDER — ALBUTEROL SULFATE (2.5 MG/3ML) 0.083% IN NEBU
2.5000 mg | INHALATION_SOLUTION | RESPIRATORY_TRACT | Status: DC | PRN
  Administered 2023-09-06: 2.5 mg via RESPIRATORY_TRACT
  Filled 2023-09-06: qty 3

## 2023-09-06 MED ORDER — METHYLPREDNISOLONE SODIUM SUCC 125 MG IJ SOLR
125.0000 mg | Freq: Once | INTRAMUSCULAR | Status: AC
Start: 2023-09-06 — End: 2023-09-06
  Administered 2023-09-06: 125 mg via INTRAVENOUS
  Filled 2023-09-06: qty 2

## 2023-09-06 MED ORDER — METHYLPREDNISOLONE SODIUM SUCC 40 MG IJ SOLR
40.0000 mg | Freq: Two times a day (BID) | INTRAMUSCULAR | Status: DC
  Administered 2023-09-06 – 2023-09-08 (×4): 40 mg via INTRAVENOUS
  Filled 2023-09-06 (×4): qty 1

## 2023-09-06 MED ORDER — ENOXAPARIN SODIUM 40 MG/0.4ML IJ SOSY
40.0000 mg | PREFILLED_SYRINGE | INTRAMUSCULAR | Status: DC
Start: 2023-09-06 — End: 2023-09-08
  Administered 2023-09-06 – 2023-09-08 (×3): 40 mg via SUBCUTANEOUS
  Filled 2023-09-06 (×3): qty 0.4

## 2023-09-06 MED ORDER — RISPERIDONE 3 MG PO TABS
3.0000 mg | ORAL_TABLET | Freq: Two times a day (BID) | ORAL | Status: DC
  Administered 2023-09-06 – 2023-09-09 (×7): 3 mg via ORAL
  Filled 2023-09-06 (×13): qty 1

## 2023-09-06 MED ORDER — BUDESONIDE 0.5 MG/2ML IN SUSP
0.5000 mg | Freq: Two times a day (BID) | RESPIRATORY_TRACT | Status: DC
  Administered 2023-09-06 – 2023-09-10 (×9): 0.5 mg via RESPIRATORY_TRACT
  Filled 2023-09-06 (×12): qty 2

## 2023-09-06 MED ORDER — IPRATROPIUM-ALBUTEROL 0.5-2.5 (3) MG/3ML IN SOLN
3.0000 mL | Freq: Four times a day (QID) | RESPIRATORY_TRACT | Status: DC
  Administered 2023-09-06 – 2023-09-08 (×7): 3 mL via RESPIRATORY_TRACT
  Filled 2023-09-06 (×9): qty 3

## 2023-09-06 MED ORDER — FAMOTIDINE 20 MG PO TABS
40.0000 mg | ORAL_TABLET | Freq: Two times a day (BID) | ORAL | Status: DC
  Administered 2023-09-06 – 2023-09-08 (×4): 40 mg via ORAL
  Filled 2023-09-06 (×4): qty 2

## 2023-09-06 MED ORDER — POLYETHYLENE GLYCOL 3350 17 G PO PACK
17.0000 g | PACK | Freq: Every day | ORAL | Status: DC | PRN
  Filled 2023-09-06: qty 1

## 2023-09-06 MED ORDER — LIDOCAINE 5 % EX PTCH
1.0000 | MEDICATED_PATCH | CUTANEOUS | Status: DC
  Administered 2023-09-07: 1 via TRANSDERMAL
  Filled 2023-09-06 (×2): qty 1

## 2023-09-06 MED ORDER — BENZTROPINE MESYLATE 2 MG PO TABS
2.0000 mg | ORAL_TABLET | Freq: Two times a day (BID) | ORAL | Status: DC
  Administered 2023-09-06 – 2023-09-10 (×8): 2 mg via ORAL
  Filled 2023-09-06 (×11): qty 1
  Filled 2023-09-06: qty 2

## 2023-09-06 NOTE — ED Notes (Addendum)
 RT at [facility]. Bipap continues. Pt to be admitted.

## 2023-09-06 NOTE — ED Provider Notes (Signed)
 [name] EMERGENCY DEPARTMENT AT [facility] Provider Note   CSN: [id number] Arrival date & time: 09/06/23  9372     Patient presents with: Respiratory Distress   Blake Mcknight is a 68 y.o. male.   The history is provided by the patient, the EMS personnel and medical records. No language interpreter was used.     68 year old male with significant history of COPD with chronic respiratory failure currently on 2 L at home oxygen , history of lung cancer status post SBRT, schizophrenia, diabetes, hypertension, hyperlipidemia, brought here via EMS from home for evaluation of shortness of breath.  History is limited as patient is having trouble breathing.  EMS report they were called out when patient was having trouble breathing, initially his O2 sats was in the 70s despite being on 2 L.  Patient was placed on a CPAP with improvement of his oxygen .  He also received a DuoNeb en route.  Patient did endorse having increasing shortness of breath ongoing for the past 2 days.  He denies any significant pain he denies any chest pain no fever no productive cough no abdominal pain.  He was seen in the ER 2 days ago for similar presentation.  Chest CT angiogram was performed at that time which shows no evidence of PE.  Prior to Admission medications   Medication Sig Start Date End Date Taking? Authorizing Provider  acetaminophen  (TYLENOL ) 500 MG tablet Take 2 tablets (1,000 mg total) by mouth every 8 (eight) hours. 08/14/23   Fausto Burnard LABOR, DO  albuterol  (PROVENTIL ) (2.5 MG/3ML) 0.083% nebulizer solution Take 3 mLs (2.5 mg total) by nebulization every 4 (four) hours as needed for wheezing or shortness of breath. 08/13/23   Fausto Burnard A, DO  albuterol  (VENTOLIN  HFA) 108 (90 Base) MCG/ACT inhaler Inhale 2 puffs into the lungs every 4 (four) hours as needed for wheezing or shortness of breath. 10/02/22   Steinl, Kevin, MD  amLODipine  (NORVASC ) 5 MG tablet Take 5 mg by mouth daily.  06/26/21   [provider]  atorvastatin  (LIPITOR) 10 MG tablet Take 1 tablet (10 mg total) by mouth at bedtime. 08/14/23   Fausto Burnard LABOR, DO  benztropine  (COGENTIN ) 2 MG tablet Take 1 tablet (2 mg total) by mouth 2 (two) times daily. 07/30/23   Arfeen, Leni DASEN, MD  budesonide  (PULMICORT ) 0.25 MG/2ML nebulizer solution Take 2 mLs (0.25 mg total) by nebulization 2 (two) times daily. 08/14/23 09/28/23  Fausto Burnard LABOR, DO  budesonide -glycopyrrolate -formoterol  (BREZTRI AEROSPHERE) 160-9-4.8 MCG/ACT AERO inhaler Inhale 1-2 puffs into the lungs 2 (two) times daily.    [provider]  carvedilol  (COREG ) 3.125 MG tablet Take 1 tablet (3.125 mg total) by mouth 2 (two) times daily with a meal. 02/20/19   Cheryle Page, MD  cetirizine (ZYRTEC) 10 MG tablet Take 10 mg by mouth daily as needed for allergies or rhinitis.    [provider]  cholecalciferol (VITAMIN D) 25 MCG (1000 UNIT) tablet Take 5,000 Units by mouth every morning.    [provider]  famotidine  (PEPCID ) 40 MG tablet Take 40 mg by mouth 2 (two) times daily. 12/28/18   [provider]  feeding supplement, GLUCERNA SHAKE, (GLUCERNA SHAKE) LIQD Take 237 mLs by mouth 3 (three) times daily between meals. 08/14/23   Fausto Burnard LABOR, DO  ibuprofen  (ADVIL ) 400 MG tablet Take 1 tablet (400 mg total) by mouth every 6 (six) hours as needed (moderate to severe pain). 08/14/23   Fausto Burnard LABOR, DO  ipratropium-albuterol  (DUONEB) 0.5-2.5 (3) MG/3ML SOLN Take 3 mLs by nebulization every 4 (four) hours. 08/14/23   Fausto Sor A, DO  JANUVIA 100 MG tablet Take 100 mg by mouth daily. 01/30/23   [provider]  lidocaine  (LIDODERM ) 5 % Place 1 patch onto the skin daily. Remove & Discard patch within 12 hours or as directed by MD 08/14/23   Fausto Sor A, DO  melatonin 5 MG TABS Take 1 tablet (5 mg total) by mouth at bedtime as needed. 08/14/23   Fausto Sor LABOR, DO  methocarbamol  (ROBAXIN ) 750 MG  tablet Take 1 tablet (750 mg total) by mouth every 6 (six) hours as needed for muscle spasms. 08/14/23   Fausto Sor LABOR, DO  mirtazapine  (REMERON ) 30 MG tablet Take 1 tablet (30 mg total) by mouth at bedtime as needed (insomnia). Patient taking differently: Take 30 mg by mouth at bedtime. 07/30/23 07/29/24  Arfeen, Leni DASEN, MD  multivitamin (ONE-A-DAY MEN'S) TABS tablet Take 1 tablet by mouth daily. Patient not taking: Reported on 08/09/2023    [provider]  OXYGEN  Inhale 2 L/min into the lungs continuous.     [provider]  polyethylene glycol (MIRALAX  / GLYCOLAX ) 17 g packet Take 17 g by mouth daily as needed for mild constipation.    [provider]  potassium chloride  SA (KLOR-CON  M) 20 MEQ tablet Take 1 tablet (20 mEq total) by mouth 2 (two) times daily. 08/14/23   Fausto Sor LABOR, DO  predniSONE  (DELTASONE ) 10 MG tablet Take 10 mg by mouth daily with breakfast. Patient not taking: Reported on 09/02/2023    [provider]  risperiDONE  (RISPERDAL ) 3 MG tablet Take 1 tablet (3 mg total) by mouth 2 (two) times daily. 07/30/23   Arfeen, Leni DASEN, MD  sodium chloride  (OCEAN) 0.65 % SOLN nasal spray Place 1 spray into both nostrils as needed for congestion.    [provider]  tamsulosin  (FLOMAX ) 0.4 MG CAPS capsule Take 0.4 mg by mouth in the morning and at bedtime. 07/23/20   [provider]  traMADol  (ULTRAM ) 50 MG tablet Take 1 tablet (50 mg total) by mouth every 6 (six) hours as needed (pain not controlled with Tylenol , ibuprofen  and Robaxin ). 08/14/23   Fausto Sor LABOR, DO    Allergies: Patient has no known allergies.    Review of Systems  All other systems reviewed and are negative.   Updated Vital Signs BP 128/72   Pulse (!) 124   Temp (!) 97.5 F (36.4 C) (Axillary)   Resp (!) 26   Ht 5' 6 (1.676 m)   Wt 71.7 kg   SpO2 97%   BMI 25.50 kg/m   Physical Exam Constitutional:      Appearance: He is well-developed.      Comments: Patient is in moderate respiratory discomfort with increased work of breathing.  HENT:     Head: Atraumatic.   Eyes:     Conjunctiva/sclera: Conjunctivae normal.    Cardiovascular:     Rate and Rhythm: Tachycardia present.     Pulses: Normal pulses.     Heart sounds: Normal heart sounds.  Pulmonary:     Breath sounds: Wheezing present. No rhonchi or rales.  Abdominal:     Palpations: Abdomen is soft.   Musculoskeletal:     Cervical back: Normal range of motion and neck supple.     Right lower leg: No edema.     Left lower leg: No edema.   Skin:  Findings: No rash.   Neurological:     Mental Status: He is alert.     (all labs ordered are listed, but only abnormal results are displayed) Labs Reviewed  BASIC METABOLIC PANEL WITH GFR - Abnormal; Notable for the following components:      Result Value   Sodium 133 (*)    Chloride 97 (*)    Glucose, Bld 153 (*)    BUN <5 (*)    All other components within normal limits  CBC WITH DIFFERENTIAL/PLATELET - Abnormal; Notable for the following components:   Hemoglobin 12.0 (*)    HCT 36.5 (*)    RDW 15.7 (*)    All other components within normal limits  I-STAT VENOUS BLOOD GAS, ED - Abnormal; Notable for the following components:   pCO2, Ven 42.8 (*)    pO2, Ven 189 (*)    Sodium 132 (*)    HCT 36.0 (*)    Hemoglobin 12.2 (*)    All other components within normal limits  BRAIN NATRIURETIC PEPTIDE    EKG: EKG Interpretation Date/Time:  Sunday September 06 2023 06:45:45 EDT Ventricular Rate:  120 PR Interval:  134 QRS Duration:  80 QT Interval:  286 QTC Calculation: 404 R Axis:   94  Text Interpretation: Sinus tachycardia Right axis deviation Confirmed by Cottie Cough [phone]) on 09/06/2023 7:08:56 AM  Radiology: [facility] Chest Port 1 View Result Date: 09/06/2023 EXAM: 1 VIEW XRAY OF THE CHEST 09/06/2023 07:10:00 AM COMPARISON: 2-view chest x-ray 09/03/2023 and CT angio of chest 09/04/2023. CLINICAL HISTORY:  SOB FINDINGS: LUNGS AND PLEURA: Severe emphysematous changes are again noted. Mass-like consolidation in the posterior right lower lobe is similar to the prior studies. Right greater than left pleural effusions are stable. HEART AND MEDIASTINUM: No acute abnormality of the cardiac and mediastinal silhouettes. BONES AND SOFT TISSUES: No acute osseous abnormality. IMPRESSION: 1. Mass-like consolidation in the posterior right lower lobe, similar to prior studies. 2. Right greater than left pleural effusions, stable. 3. Severe emphysematous changes. Electronically signed by: Lonni Necessary MD 09/06/2023 07:23 AM EDT RP Workstation: [id number]     .Critical Care  Performed by: Nivia Colon, PA-C Authorized by: Nivia Colon, PA-C   Critical care provider statement:    Critical care time (minutes):  32   Critical care was time spent personally by me on the following activities:  Development of treatment plan with patient or surrogate, discussions with consultants, evaluation of patient's response to treatment, examination of patient, ordering and review of laboratory studies, ordering and review of radiographic studies, ordering and performing treatments and interventions, pulse oximetry, re-evaluation of patient's condition and review of old charts    Medications Ordered in the ED  methylPREDNISolone  sodium succinate (SOLU-MEDROL ) 125 mg/2 mL injection 125 mg (has no administration in time range)  ipratropium-albuterol  (DUONEB) 0.5-2.5 (3) MG/3ML nebulizer solution 3 mL (has no administration in time range)    Clinical Course as of 09/06/23 0729  Sun Sep 06, 2023  0726 68 yo male here with SOB - had CT PE 2 days ago in ED with masslike RLL consolidation concerning for cancer, hx of emphysema.  Here on bipap doing better clinically, still wheezing.   Will treat for COPD anticipate admission [MT]    Clinical Course User Index [MT] Trifan, Cough PARAS, MD  Medical  Decision Making Amount and/or Complexity of Data Reviewed Labs: ordered. Radiology: ordered.  Risk Prescription drug management.   BP 128/72   Pulse (!) 124   Temp (!) 97.5 F (36.4 C) (Axillary)   Resp (!) 26   Ht 5' 6 (1.676 m)   Wt 71.7 kg   SpO2 97%   BMI 25.50 kg/m   5:70 AM  68 year old male with significant history of COPD with chronic respiratory failure currently on 2 L at home oxygen , history of lung cancer status post SBRT, schizophrenia, diabetes, hypertension, hyperlipidemia, brought here via EMS from home for evaluation of shortness of breath.  History is limited as patient is having trouble breathing.  EMS report they were called out when patient was having trouble breathing, initially his O2 sats was in the 70s despite being on 2 L.  Patient was placed on a CPAP with improvement of his oxygen .  He also received a DuoNeb en route.  Patient did endorse having increasing shortness of breath ongoing for the past 2 days.  He denies any significant pain he denies any chest pain no fever no productive cough no abdominal pain.  He was seen in the ER 2 days ago for similar presentation.  Chest CT angiogram was performed at that time which shows no evidence of PE.  On exam patient is in moderate respiratory discomfort and he is actively wheezing, using some assessor muscle use and speaking in only 2 or 3 word sentences.  Patient was placed on a nonrebreather and then transition over to a BiPAP.  Workup initiated patient will receive breathing treatment which including DuoNeb, Solu-Medrol   On reassessment, patient appears to be more comfortable on BiPAP.  -Labs ordered, independently viewed and interpreted by me.  Labs remarkable for venous pH of 7.4, no evidence of respiratory acidosis.  CBG mildly elevated at 153.  Normal WBC of 7.2, hemoglobin is 12.0, BNP of 28.2 -The patient was maintained on a cardiac monitor.  I personally viewed and interpreted the cardiac monitored which  showed an underlying rhythm of: Sinus tachycardia -Imaging independently viewed and interpreted by me and I agree with radiologist's interpretation.  Result remarkable for chest x-ray demonstrate masslike consolidation to the posterior right lower lobe similar to prior study.  Patient also has severe emphysematous changes along with right greater than left pleural effusion. -This patient presents to the ED for concern of shortness of breath, this involves an extensive number of treatment options, and is a complaint that carries with it a high risk of complications and morbidity.  The differential diagnosis includes COPD, emphysema, pneumonia, lung cancer, PE -Co morbidities that complicate the patient evaluation includes COPD, emphysema, hypertension, schizophrenia, tobacco use -Treatment includes DuoNebs, Solu-Medrol , BiPAP -Reevaluation of the patient after these medicines showed that the patient improved -PCP office notes or outside notes reviewed -Discussion with attending Dr. Cottie.  Appreciate consultation with [facility] hospitalist Dr. Claudene who agrees to admit patient. -Escalation to admission/observation considered: patient will be admitted for COPD exacerbation with respiratory failure requiring BiPAP.  Patient likely may benefit from stepdown floor      Final diagnoses:  Acute respiratory failure with hypoxia [facility])  COPD exacerbation [facility])    ED Discharge Orders     None          Nivia Colon, PA-C 09/06/23 0757    Cottie [name], MD 09/06/23 1253

## 2023-09-06 NOTE — ED Notes (Addendum)
 Pt alert, NAD, calm, interactive, tolerating Bipap, resting comfortably on Bipap, family at [facility]. Updated. Denies questions. Denies pain or nausea. VSS.NSR on monitor. Pt d/w RT.

## 2023-09-06 NOTE — H&P (Signed)
 History and Physical    Patient: Blake Mcknight FMW:[id number] DOB: October 30, 1955 DOA: 09/06/2023 DOS: the patient was seen and examined on 09/06/2023 PCP: [name], MD  Patient coming from: Home (lives with his sister) via EMS  Chief Complaint:  Chief Complaint  Patient presents with   Respiratory Distress   HPI: Blake Mcknight is a 68 y.o. male with medical history significant of COPD and chronic respiratory failure on 2L home O2, hx lung CA s/p SBRT, HTN, HLD, schizophrenia, and DM type II presents with worsening breathing difficulties.  He is accompanied by his brother who tries to provide additional history.  He was recently hospitalized from 5/25 -5/30 after coming in for COPD exacerbation with SIRS treated with steroids breathing treatments and empiric antibiotics with improvement in symptoms prior to discharge.  He initially felt well for about three days. However, his breathing difficulties worsened after returning home. His sister noted that he was lying flat in bed, which made it difficult for him to cough up mucus. When he was sitting up in a chair, he had no issues with mucus clearance.  No fever, vomiting, leg swelling, or chest pain.  He has had wheezing.  Reports using home inhalers and breathing treatments without improvement in symptoms.  En route with EMS his initial O2 saturations were in the 70s on home 2 L of nasal cannula oxygen .  Patient was given 2 DuoNeb breathing treatments prior to arrival.  In the ED patient was noted to be afebrile with heart rates elevated up to 124, respirations 26, and was placed on BiPAP with improvement in his work of breathing and O2 saturations maintained.  Labs noted BNP within normal limits at 28.2, hemoglobin around baseline at 12, sodium 133, chloride 97, and glucose 153.  Venous blood gas did not note any significant signs of hypercapnia.  Chest x-ray noted a masslike consolidation in the posterior right lower lobe similar  to prior studies with right greater than left pleural effusions that were stable, right greater than left pleural effusions which are stable, and severe emphysematous changes.  Patient had been given Solu-Medrol  125 mg IV and a DuoNeb breathing treatment.  TRH called to admit.   Review of Systems: As mentioned in the history of present illness. All other systems reviewed and are negative. Past Medical History:  Diagnosis Date   Abdominal pain 06/03/2013   Chest pain 08/10/2023   COPD (chronic obstructive pulmonary disease) (HCC)    with bullous emphysema.    COPD exacerbation (HCC) 08/09/2023   Heavy cigarette smoker before 2003   pt claims only 10 cigs per day, never heavier amounts.    HLD (hyperlipidemia)    Hypertension    Schizophrenia (HCC) 06/28/2013   This is a chronic condition and he lives with family   Past Surgical History:  Procedure Laterality Date   CHOLECYSTECTOMY N/A 06/06/2013   Procedure: LAPAROSCOPIC CHOLECYSTECTOMY WITH ATTEMPTED INTRAOPERATIVE CHOLANGIOGRAM;  Surgeon: Dann FORBES Hummer, MD;  Location: [facility] OR;  Service: General;  Laterality: N/A;   ERCP N/A 06/03/2013   Procedure: ENDOSCOPIC RETROGRADE CHOLANGIOPANCREATOGRAPHY (ERCP);  Surgeon: Gwendlyn ONEIDA Buddy, MD;  Location: [facility] ENDOSCOPY;  Service: Endoscopy;  Laterality: N/A;   Social History:  reports that he has quit smoking. His smoking use included cigarettes. He has a 41 pack-year smoking history. He has never used smokeless tobacco. He reports that he does not drink alcohol and does not use drugs.  No Known Allergies  Family History  Problem Relation Age  of Onset   Diabetes Mellitus II Mother    Diabetes Mother    Heart disease Father    Stroke Maternal Aunt    CAD Neg Hx     Prior to Admission medications   Medication Sig Start Date End Date Taking? Authorizing Provider  acetaminophen  (TYLENOL ) 500 MG tablet Take 2 tablets (1,000 mg total) by mouth every 8 (eight) hours. 08/14/23   Fausto Burnard LABOR,  DO  albuterol  (PROVENTIL ) (2.5 MG/3ML) 0.083% nebulizer solution Take 3 mLs (2.5 mg total) by nebulization every 4 (four) hours as needed for wheezing or shortness of breath. 08/13/23   Fausto Burnard A, DO  albuterol  (VENTOLIN  HFA) 108 (90 Base) MCG/ACT inhaler Inhale 2 puffs into the lungs every 4 (four) hours as needed for wheezing or shortness of breath. 10/02/22   Steinl, Kevin, MD  amLODipine  (NORVASC ) 5 MG tablet Take 5 mg by mouth daily. 06/26/21   [provider]  atorvastatin  (LIPITOR) 10 MG tablet Take 1 tablet (10 mg total) by mouth at bedtime. 08/14/23   Fausto Burnard A, DO  benztropine  (COGENTIN ) 2 MG tablet Take 1 tablet (2 mg total) by mouth 2 (two) times daily. 07/30/23   Arfeen, Leni DASEN, MD  budesonide  (PULMICORT ) 0.25 MG/2ML nebulizer solution Take 2 mLs (0.25 mg total) by nebulization 2 (two) times daily. 08/14/23 09/28/23  Fausto Burnard A, DO  budesonide -glycopyrrolate -formoterol  (BREZTRI AEROSPHERE) 160-9-4.8 MCG/ACT AERO inhaler Inhale 1-2 puffs into the lungs 2 (two) times daily.    [provider]  carvedilol  (COREG ) 3.125 MG tablet Take 1 tablet (3.125 mg total) by mouth 2 (two) times daily with a meal. 02/20/19   Cheryle Page, MD  cetirizine (ZYRTEC) 10 MG tablet Take 10 mg by mouth daily as needed for allergies or rhinitis.    [provider]  cholecalciferol (VITAMIN D) 25 MCG (1000 UNIT) tablet Take 5,000 Units by mouth every morning.    [provider]  famotidine  (PEPCID ) 40 MG tablet Take 40 mg by mouth 2 (two) times daily. 12/28/18   [provider]  feeding supplement, GLUCERNA SHAKE, (GLUCERNA SHAKE) LIQD Take 237 mLs by mouth 3 (three) times daily between meals. 08/14/23   Fausto Burnard LABOR, DO  ibuprofen  (ADVIL ) 400 MG tablet Take 1 tablet (400 mg total) by mouth every 6 (six) hours as needed (moderate to severe pain). 08/14/23   Fausto Burnard LABOR, DO  ipratropium-albuterol  (DUONEB) 0.5-2.5 (3) MG/3ML SOLN Take 3 mLs by  nebulization every 4 (four) hours. 08/14/23   Fausto Burnard A, DO  JANUVIA 100 MG tablet Take 100 mg by mouth daily. 01/30/23   [provider]  lidocaine  (LIDODERM ) 5 % Place 1 patch onto the skin daily. Remove & Discard patch within 12 hours or as directed by MD 08/14/23   Fausto Burnard A, DO  melatonin 5 MG TABS Take 1 tablet (5 mg total) by mouth at bedtime as needed. 08/14/23   Fausto Burnard A, DO  methocarbamol  (ROBAXIN ) 750 MG tablet Take 1 tablet (750 mg total) by mouth every 6 (six) hours as needed for muscle spasms. 08/14/23   Fausto Burnard A, DO  mirtazapine  (REMERON ) 30 MG tablet Take 1 tablet (30 mg total) by mouth at bedtime as needed (insomnia). Patient taking differently: Take 30 mg by mouth at bedtime. 07/30/23 07/29/24  Arfeen, Leni DASEN, MD  multivitamin (ONE-A-DAY MEN'S) TABS tablet Take 1 tablet by mouth daily. Patient not taking: Reported on 08/09/2023    [provider]  OXYGEN  Inhale  2 L/min into the lungs continuous.     [provider]  polyethylene glycol (MIRALAX  / GLYCOLAX ) 17 g packet Take 17 g by mouth daily as needed for mild constipation.    [provider]  potassium chloride  SA (KLOR-CON  M) 20 MEQ tablet Take 1 tablet (20 mEq total) by mouth 2 (two) times daily. 08/14/23   Fausto Burnard LABOR, DO  predniSONE  (DELTASONE ) 10 MG tablet Take 10 mg by mouth daily with breakfast. Patient not taking: Reported on 09/02/2023    [provider]  risperiDONE  (RISPERDAL ) 3 MG tablet Take 1 tablet (3 mg total) by mouth 2 (two) times daily. 07/30/23   Arfeen, Leni DASEN, MD  sodium chloride  (OCEAN) 0.65 % SOLN nasal spray Place 1 spray into both nostrils as needed for congestion.    [provider]  tamsulosin  (FLOMAX ) 0.4 MG CAPS capsule Take 0.4 mg by mouth in the morning and at bedtime. 07/23/20   [provider]  traMADol  (ULTRAM ) 50 MG tablet Take 1 tablet (50 mg total) by mouth every 6 (six) hours as needed (pain not  controlled with Tylenol , ibuprofen  and Robaxin ). 08/14/23   Fausto Burnard LABOR, DO    Physical Exam: Vitals:   09/06/23 0633 09/06/23 0636 09/06/23 0730  BP: 128/72    Pulse: (!) 124  (!) 102  Resp: (!) 26  18  Temp: (!) 97.5 F (36.4 C)    TempSrc: Axillary    SpO2: 97%  99%  Weight:  71.7 kg   Height:  5' 6 (1.676 m)     Constitutional: Elderly male currently in no acute distress on BiPAP Eyes: PERRL, lids and conjunctivae normal ENMT: Mucous membranes are moist.  Neck: normal, supple.  No JVD. Respiratory: Decreased aeration with expiratory wheezes appreciated.  Patient only able to speak in short sentences while on BiPAP, but breathing nonlabored Cardiovascular: Regular rate and rhythm, no murmurs / rubs / gallops.   Abdomen: no tenderness, no masses palpated.  Bowel sounds positive.  Musculoskeletal: no clubbing / cyanosis. No joint deformity upper and lower extremities. Good ROM, no contractures. Normal muscle tone.  Skin: no rashes, lesions, ulcers. No induration Neurologic: CN 2-12 grossly intact.  Strength 5/5 in all 4.  Psychiatric: Normal judgment and insight. Alert and oriented x 3. Normal mood.   Data Reviewed:   Reviewed labs, imaging, and pertinent records as documented.  Assessment and Plan:  Acute on chronic respiratory failure with hypoxia secondary to COPD exacerbation Patient presents with increasing shortness of breath and cough.  Found to be hypoxic down into the 70s on home 2 L.  Brother makes note that patient has coughing spells while laying flat at night which he feels he is not clearing his secretions.  On physical exam patient currently on BiPAP with wheezes appreciated decreased overall aeration.  Chest x-ray showing similar masslike consolidation in the posterior right lower lobe with right greater than left pleural effusions that were stable and severe emphysematous changes.  Patient had been given Solu-Medrol  125 mg IV and DuoNeb breathing  treatment. - Admit to progressive bed - Continuous pulse oximetry with oxygen  to maintain O2 saturations greater than 90% - BiPAP and wean to home 2 L of oxygen  once able - Incentive spirometry and flutter valve - Check procalcitonin - DuoNebs 4 times daily and albuterol  nebs as needed every 2 hours - Brovana  and budesonide  nebs twice daily - Solu-Medrol  40 mg IV every 12 hours - Rocephin  - Mucinex  once able  Essential hypertension On admission blood pressures noted to be maintained. - Continue amlodipine  and Coreg  once able  History of lung cancer Patient with a history of presumed non-small cell lung cancer s/p SBRT.  Patient is followed by Dr. Shelah of pulmonology in the outpatient setting.  Most recent CTA of the chest from 6/20 noted progression of disease.  Records note he is not a candidate for EBUS and unlikely candidate for aggressive treatments.  Outpatient follow-up with [facility] palliative and pulmonology appear to have been scheduled. - Continue outpatient follow-up with pulmonology and palliative care  Controlled diabetes mellitus type 2, without long-term use of insulin  On admission glucose 153.  Last available hemoglobin A1c was 5.9 when checked on 08/10/2023.   - Hypoglycemia protocols - Hold Januvia  Schizophrenia - Continue risperidone  and benztropine   Normocytic anemia Chronic.  Hemoglobin noted to be 12 which appears around patient's baseline.  No reports of bleeding. - Continue to monitor  Hyponatremia  Hypochloremia Chronic.  Sodium 133 with chloride 97 which appears to be chronic in nature.  May be related with patient's medications for schizophrenia as risperidone  can cause SIADH  Hyperlipidemia - Continue Lipitor  BPH - Continue Flomax   GERD - Continue Pepcid   DVT prophylaxis: Lovenox   Advance Care Planning:   Code Status: Limited: Do not attempt resuscitation (DNR) -DNR-LIMITED -Do Not Intubate/DNI    Consults: none  Family Communication:  Brother updated at bedside  Severity of Illness: The appropriate patient status for this patient is INPATIENT. Inpatient status is judged to be reasonable and necessary in order to provide the required intensity of service to ensure the patient's safety. The patient's presenting symptoms, physical exam findings, and initial radiographic and laboratory data in the context of their chronic comorbidities is felt to place them at high risk for further clinical deterioration. Furthermore, it is not anticipated that the patient will be medically stable for discharge from the hospital within 2 midnights of admission.   * I certify that at the point of admission it is my clinical judgment that the patient will require inpatient hospital care spanning beyond 2 midnights from the point of admission due to high intensity of service, high risk for further deterioration and high frequency of surveillance required.*  Author: Maximino DELENA Sharps, MD 09/06/2023 7:49 AM  For on call review [email or web address].

## 2023-09-06 NOTE — ED Notes (Signed)
 Moved from orange to green, familyaa tBS, RT at [facility], removed from Bipap. Meds infusing. Alert, NAD, calm, interactive.

## 2023-09-06 NOTE — Progress Notes (Signed)
 Patient transferred to [facility].

## 2023-09-06 NOTE — Progress Notes (Signed)
   09/06/23 0637  BiPAP/CPAP/SIPAP  $ Non-Invasive Ventilator  Non-Invasive Vent Set Up;Non-Invasive Vent Initial  $ Face Mask Medium Yes  BiPAP/CPAP/SIPAP Pt Type Adult  BiPAP/CPAP/SIPAP SERVO  Mask Type Full face mask  Dentures removed? Not applicable  Mask Size Medium  Set Rate 0 breaths/min  Respiratory Rate 25 breaths/min  IPAP 13 cmH20  EPAP 5 cmH2O  Pressure Support 8 cmH20  PEEP 5 cmH20  FiO2 (%) 30 %  Flow Rate 0 lpm  Minute Ventilation 15.8  Leak 40  Peak Inspiratory Pressure (PIP) 13  Tidal Volume (Vt) 651  Patient Home Machine No  Patient Home Mask No  Patient Home Tubing No  Auto Titrate No  Press High Alarm 30 cmH2O  Device Plugged into RED Power Outlet Yes  Oxygen  Percent 30 %

## 2023-09-06 NOTE — ED Triage Notes (Signed)
 PT Brought in by [facility] from home, PT was picked up by EMS for resp distress and SOB. PT is alert and talking upon arrival and on a cpap placed by ems. PT had a duo ned enroute. PT o2 sat was 70s per ems on his home o2. PT visibly labored breathing.

## 2023-09-07 DIAGNOSIS — K3189 Other diseases of stomach and duodenum: Secondary | ICD-10-CM | POA: Diagnosis present

## 2023-09-07 DIAGNOSIS — J9601 Acute respiratory failure with hypoxia: Secondary | ICD-10-CM | POA: Diagnosis not present

## 2023-09-07 DIAGNOSIS — Z85118 Personal history of other malignant neoplasm of bronchus and lung: Secondary | ICD-10-CM

## 2023-09-07 DIAGNOSIS — D649 Anemia, unspecified: Secondary | ICD-10-CM | POA: Diagnosis not present

## 2023-09-07 DIAGNOSIS — I10 Essential (primary) hypertension: Secondary | ICD-10-CM

## 2023-09-07 DIAGNOSIS — J9621 Acute and chronic respiratory failure with hypoxia: Secondary | ICD-10-CM | POA: Diagnosis not present

## 2023-09-07 DIAGNOSIS — J441 Chronic obstructive pulmonary disease with (acute) exacerbation: Secondary | ICD-10-CM

## 2023-09-07 LAB — GLUCOSE, CAPILLARY
Glucose-Capillary: 144 mg/dL — ABNORMAL HIGH (ref 70–99)
Glucose-Capillary: 180 mg/dL — ABNORMAL HIGH (ref 70–99)
Glucose-Capillary: 147 mg/dL — ABNORMAL HIGH (ref 70–99)
Glucose-Capillary: 117 mg/dL — ABNORMAL HIGH (ref 70–99)

## 2023-09-07 LAB — BASIC METABOLIC PANEL WITH GFR
Anion gap: 10 (ref 5–15)
BUN: <5 mg/dL — ABNORMAL LOW (ref 8–23)
CO2: 23 mmol/L (ref 22–32)
Calcium: 9 mg/dL (ref 8.9–10.3)
Chloride: 101 mmol/L (ref 98–111)
Creatinine, Ser: 0.56 mg/dL — ABNORMAL LOW (ref 0.61–1.24)
GFR, Estimated: >60 mL/min (ref >60)
Glucose, Bld: 147 mg/dL — ABNORMAL HIGH (ref 70–99)
Potassium: 4 mmol/L (ref 3.5–5.1)
Sodium: 134 mmol/L — ABNORMAL LOW (ref 135–145)

## 2023-09-07 LAB — CBC
HCT: 36.3 % — ABNORMAL LOW (ref 39.0–52.0)
Hemoglobin: 11.9 g/dL — ABNORMAL LOW (ref 13.0–17.0)
MCH: 27.4 pg (ref 26.0–34.0)
MCHC: 32.8 g/dL (ref 30.0–36.0)
MCV: 83.6 fL (ref 80.0–100.0)
Platelets: 337 10*3/uL (ref 150–400)
RBC: 4.34 MIL/uL (ref 4.22–5.81)
RDW: 15.1 % (ref 11.5–15.5)
WBC: 6.9 10*3/uL (ref 4.0–10.5)
nRBC: 0 % (ref 0.0–0.2)

## 2023-09-07 LAB — CBG MONITORING, ED: Glucose-Capillary: 156 mg/dL — ABNORMAL HIGH (ref 70–99)

## 2023-09-07 MED ORDER — ORAL CARE MOUTH RINSE: 15.0000 mL | OROMUCOSAL | Status: DC | PRN

## 2023-09-07 NOTE — Assessment & Plan Note (Addendum)
 this patient has acute respiratory failure with Hypoxia  as documented by the presence of following: O2 saturatio< 90% on baseline o2 2L Likely due to:   Pneumonia,  COPD exacerbation,   Provide O2 therapy and titrate as needed    flutter valve ordered

## 2023-09-07 NOTE — Assessment & Plan Note (Signed)
 Continue home medications Cogentin  2 mg p.o. twice daily and Risperdal  3 mg p.o. twice daily

## 2023-09-07 NOTE — Assessment & Plan Note (Signed)
 Soft tissue mass was found within duodenal bulb on June 19 He does have occasional abd pain  Able to tolerate diet Will need further discussion with family/patient if this is something they would like to have evaluated by GI. Can be done as an outpt

## 2023-09-07 NOTE — Assessment & Plan Note (Addendum)
 right lung mass. Seen by pulmonology. Not thought to be a good candidate for EBUS and even treatment going forward  Discussed with sister she says palliative care is suppose to come out to see them on Tuesday. Discussed overall prognosis with family discussed quality of life and that at some point patient and family may choose to limit hospitalizations

## 2023-09-07 NOTE — Evaluation (Signed)
 Physical Therapy Evaluation Patient Details Name: Blake Mcknight MRN: [id number] DOB: 07-14-1955 Today's Date: 09/07/2023  History of Present Illness  68 y.o. male presented 6/22 with worsening breathing difficulties.  He was recently hospitalized from 5/25 -5/30 after coming in for COPD exacerbation   Patient was was transitioned to H@H  on 5/28 and dc to Home on 5/30. medical history significant of COPD and chronic respiratory failure on 2L home O2, hx lung CA s/p SBRT, HTN, HLD, schizophrenia, and DM type II.  Clinical Impression  Pt admitted with above diagnosis. Participating  in HHPT prior to admission. Lives with sister at home, she typically assists with all ADLs, and he ambulates without an assistive device, but she will occasionally assist him. Currently able to ambulate in hallway on 2L supplemental O2 with SpO2 97%, HR into 120s, no assistive device with minor instability and scissoring noted. Will benefit from [facility] and continued HHPT following d/c. Will follow and progress acutely.  Pt currently with functional limitations due to the deficits listed below (see PT Problem List). Pt will benefit from acute skilled PT to increase their independence and safety with mobility to allow discharge.           If plan is discharge home, recommend the following: A little help with walking and/or transfers;A little help with bathing/dressing/bathroom;Assistance with cooking/housework;Direct supervision/assist for medications management;Direct supervision/assist for financial management;Assist for transportation;Help with stairs or ramp for entrance;Supervision due to cognitive status   Can travel by private vehicle        Equipment Recommendations Cane;BSC/3in1  Recommendations for Other Services       Functional Status Assessment Patient has had a recent decline in their functional status and demonstrates the ability to make significant improvements in function in a reasonable and  predictable amount of time.     Precautions / Restrictions Precautions Precautions: Fall;Other (comment) Recall of Precautions/Restrictions: Intact Precaution/Restrictions Comments: monitor O2/dyspnea. Age indeterminant copmression T7 Fx. Restrictions Weight Bearing Restrictions Per Provider Order: No      Mobility  Bed Mobility Overal bed mobility: Needs Assistance Bed Mobility: Supine to Sit     Supine to sit: Supervision     General bed mobility comments: Required cues to facilitate. Able to rise without physical help.    Transfers Overall transfer level: Needs assistance Equipment used: None Transfers: Sit to/from Stand Sit to Stand: Supervision           General transfer comment: Supervision for safety to stand without AD from edge of bed, showing very mild sway but able to self correct.    Ambulation/Gait Ambulation/Gait assistance: Contact guard assist Gait Distance (Feet): 110 Feet Assistive device: None Gait Pattern/deviations: Step-through pattern, Decreased stride length, Drifts right/left, Scissoring, Narrow base of support Gait velocity: dec Gait velocity interpretation: <1.8 ft/sec, indicate of risk for recurrent falls   General Gait Details: Cues for wider BOS due to intermittent scissoring, drifting towards Rt. Able to self correct but needs cues for safety, awareness, and CGA for safety. Required one standing break to complete distance. HR into 120s. SpO2 97% on 2L. Felt very fatigued.  Stairs            Wheelchair Mobility     Tilt Bed    Modified Rankin (Stroke Patients Only)       Balance Overall balance assessment: Mild deficits observed, not formally tested  Pertinent Vitals/Pain Pain Assessment Pain Assessment: No/denies pain    Home Living Family/patient expects to be discharged to:: Private residence Living Arrangements: Other relatives (sister) Available  Help at Discharge: Family;Available 24 hours/day Type of Home: House Home Access: Stairs to enter Entrance Stairs-Rails: None Entrance Stairs-Number of Steps: 2   Home Layout: One level Home Equipment: Wheelchair - Lawyer Comments: lives with sister. Rarely home alone    Prior Function Prior Level of Function : Needs assist;Patient poor historian/Family not available             Mobility Comments: no AD for mobility, states sister will sometimes hold his hand when walking. Was getting HHPT PTA. ADLs Comments: Per brother present, pt requires intermittent assist with ADLs, assisted with all IADLs including med mgmt     Extremity/Trunk Assessment   Upper Extremity Assessment Upper Extremity Assessment: Defer to OT evaluation    Lower Extremity Assessment Lower Extremity Assessment: Generalized weakness    Cervical / Trunk Assessment Cervical / Trunk Assessment: Normal  Communication   Communication Communication: Impaired Factors Affecting Communication: Difficulty expressing self    Cognition Arousal: Alert Behavior During Therapy: WFL for tasks assessed/performed   PT - Cognitive impairments: No family/caregiver present to determine baseline, Orientation, Attention, Awareness, Memory, Problem solving   Orientation impairments: Time, Situation                   PT - Cognition Comments: oriented to self and location - Reports year as 2045 and thought he was admitted for a procedure to fix his back Following commands: Impaired Following commands impaired: Follows multi-step commands inconsistently, Follows multi-step commands with increased time     Cueing Cueing Techniques: Verbal cues, Gestural cues     General Comments General comments (skin integrity, edema, etc.): HR to 120s with gait. SpO2 97% on 2L with gait.    Exercises     Assessment/Plan    PT Assessment Patient needs continued PT services  PT Problem List  Decreased strength;Decreased activity tolerance;Decreased balance;Decreased mobility;Decreased cognition;Decreased knowledge of use of DME;Cardiopulmonary status limiting activity;Pain       PT Treatment Interventions DME instruction;Gait training;Stair training;Functional mobility training;Therapeutic activities;Therapeutic exercise;Balance training;Neuromuscular re-education;Patient/family education;Cognitive remediation    PT Goals (Current goals can be found in the Care Plan section)  Acute Rehab PT Goals Patient Stated Goal: go home PT Goal Formulation: With patient Time For Goal Achievement: 09/21/23 Potential to Achieve Goals: Good    Frequency Min 2X/week     Co-evaluation               AM-PAC PT 6 Clicks Mobility  Outcome Measure Help needed turning from your back to your side while in a flat bed without using bedrails?: None Help needed moving from lying on your back to sitting on the side of a flat bed without using bedrails?: A Little Help needed moving to and from a bed to a chair (including a wheelchair)?: A Little Help needed standing up from a chair using your arms (e.g., wheelchair or bedside chair)?: A Little Help needed to walk in hospital room?: A Little Help needed climbing 3-5 steps with a railing? : A Little 6 Click Score: 19    End of Session Equipment Utilized During Treatment: Oxygen  Activity Tolerance: Patient tolerated treatment well Patient left: in chair;with call bell/phone within reach;with chair alarm set Nurse Communication: Mobility status PT Visit Diagnosis: Unsteadiness on feet (R26.81);Other abnormalities of gait and mobility (R26.89);Muscle weakness (generalized) (M62.81);Other symptoms  and signs involving the nervous system (R29.898)    Time: 8963-8888 PT Time Calculation (min) (ACUTE ONLY): 35 min   Charges:   PT Evaluation $PT Eval Low Complexity: 1 Low PT Treatments $Gait Training: 8-22 mins PT General Charges $$ ACUTE  PT VISIT: 1 Visit         Leontine Roads, PT, DPT [facility]  Rehabilitation Services Physical Therapist Office: [phone] Website: [email or web address]   Leontine GORMAN Roads 09/07/2023, 2:01 PM

## 2023-09-07 NOTE — Assessment & Plan Note (Signed)
 HbA1c 5.9 in May  Recent BG 156 Since patient on IV steroids in there is evidence of significant hyperglycemia may need glipizide short-term while at H@H  program

## 2023-09-07 NOTE — Evaluation (Addendum)
 Occupational Therapy Evaluation Patient Details Name: Blake Mcknight MRN: [id number] DOB: 1955-09-12 Today's Date: 09/07/2023   History of Present Illness   Pt is a 68 y/o male admitted with acute COPD exacerbation w/ acute on chronic respiratory failure with hypoxia. Recent admission 5/25-5/30 for COPD exacerbation with SIRS. PMH: COPD, HLD, HTN, GERD, BPH, DM2,  schizophrenia, lung cancer     Clinical Impressions PTA, pt lives with sister, typically ambulatory without AD, receives light assist for ADLs and sister manages IADLs. Pt presents now with deficits in cognition (baseline), dynamic standing balance and cardiopulmonary endurance. Pt able to mobilize in room w/o AD at [facility], requires Supervision for UB ADLs and Min A for simulated LB ADLs. Pt's brother at bedside during evaluation. Provided initial education on energy conservation (handout provided), pursed lip breathing and ADL modifications. Pt may benefit from [facility] at DC to reinforce these skills in familiar home environment.      If plan is discharge home, recommend the following:   A little help with bathing/dressing/bathroom;Assistance with cooking/housework;Direct supervision/assist for medications management;Direct supervision/assist for financial management;Assist for transportation;Supervision due to cognitive status     Functional Status Assessment   Patient has had a recent decline in their functional status and demonstrates the ability to make significant improvements in function in a reasonable and predictable amount of time.     Equipment Recommendations   BSC/3in1     Recommendations for Other Services         Precautions/Restrictions   Precautions Precautions: Fall;Other (comment) Precaution/Restrictions Comments: monitor O2/dyspnea Restrictions Weight Bearing Restrictions Per Provider Order: No     Mobility Bed Mobility               General bed mobility comments: in recliner on  entry    Transfers Overall transfer level: Needs assistance Equipment used: None Transfers: Sit to/from Stand Sit to Stand: Contact guard assist                  Balance Overall balance assessment: Mild deficits observed, not formally tested                                         ADL either performed or assessed with clinical judgement   ADL Overall ADL's : Needs assistance/impaired Eating/Feeding: Set up;Sitting   Grooming: Supervision/safety;Standing   Upper Body Bathing: Supervision/ safety;Sitting   Lower Body Bathing: Minimal assistance;Sitting/lateral leans;Sit to/from stand   Upper Body Dressing : Supervision/safety;Sitting   Lower Body Dressing: Minimal assistance;Sit to/from stand;Sitting/lateral leans   Toilet Transfer: Contact guard Marine scientist Details (indicate cue type and reason): no AD, minor sway when turning around in bathroom with CGA to correct. Once in bathroom, pt reported he did not need to use it Toileting- Architect and Hygiene: Minimal assistance;Sitting/lateral lean;Sit to/from stand       Functional mobility during ADLs: Contact guard assist General ADL Comments: Pt's brother at bedside. Discussed O2 monitoring, energy conservation (handout provided) and activity pacing.     Vision Ability to See in Adequate Light: 0 Adequate Patient Visual Report: No change from baseline Vision Assessment?: No apparent visual deficits     Perception         Praxis         Pertinent Vitals/Pain Pain Assessment Pain Assessment: No/denies pain     Extremity/Trunk Assessment Upper Extremity Assessment Upper Extremity Assessment: Overall [facility]  for tasks assessed;Right hand dominant   Lower Extremity Assessment Lower Extremity Assessment: Defer to PT evaluation   Cervical / Trunk Assessment Cervical / Trunk Assessment: Normal   Communication Communication Communication: Impaired Factors  Affecting Communication: Difficulty expressing self   Cognition Arousal: Alert Behavior During Therapy: WFL for tasks assessed/performed Cognition: History of cognitive impairments             OT - Cognition Comments: hx of dementia, pt pleasant, difficulty expressing needs (reported need for bathroom but once to bathroom, reported no need). Smiling, laughing often. memory deficits noted as family assisting to fill in PLOF info                 Following commands: Impaired Following commands impaired: Only follows one step commands consistently, Follows one step commands with increased time, Follows multi-step commands inconsistently     Cueing  General Comments   Cueing Techniques: Verbal cues;Gestural cues;Tactile cues  SpO2 100% on 2 L O2 in recliner, 98% on RA at rest. SpO2 93% on RA after bathroom mobility, replaced on 2 L O2 at end of session   Exercises     Shoulder Instructions      Home Living Family/patient expects to be discharged to:: Private residence Living Arrangements: Other relatives (sister) Available Help at Discharge: Family;Available 24 hours/day Type of Home: House Home Access: Stairs to enter [employer] of Steps: 2 Entrance Stairs-Rails: None Home Layout: One level     Bathroom Shower/Tub: Chief Strategy Officer: Standard Bathroom Accessibility: Yes   Home Equipment: Wheelchair - Manufacturing systems engineer   Additional Comments: lives with sister. Rarely home alone      Prior Functioning/Environment Prior Level of Function : Needs assist;Patient poor historian/Family not available             Mobility Comments: no AD for mobility ADLs Comments: Per brother present, pt requires intermittent assist with ADLs, assisted with all IADLs including med mgmt    OT Problem List: Decreased activity tolerance;Impaired balance (sitting and/or standing);Decreased cognition;Cardiopulmonary status limiting activity   OT  Treatment/Interventions: Self-care/ADL training;Therapeutic exercise;Energy conservation;DME and/or AE instruction;Therapeutic activities;Balance training;Patient/family education      OT Goals(Current goals can be found in the care plan section)   Acute Rehab OT Goals Patient Stated Goal: improve breathing, go home tomorrow OT Goal Formulation: With patient/family Time For Goal Achievement: 09/21/23 Potential to Achieve Goals: Good ADL Goals Pt Will Transfer to Toilet: with modified independence;ambulating Pt Will Perform Toileting - Clothing Manipulation and hygiene: with modified independence;sit to/from stand;sitting/lateral leans Additional ADL Goal #1: Pt/family to verbalize at least 3 energy conservation strategies to implement during ADLs/mobility Additional ADL Goal #2: Pt/family to verbalize at least 3 fall prevention strategies to implement at home   OT Frequency:  Min 2X/week    Co-evaluation              AM-PAC OT 6 Clicks Daily Activity     Outcome Measure Help from another person eating meals?: A Little Help from another person taking care of personal grooming?: A Little Help from another person toileting, which includes using toliet, bedpan, or urinal?: A Little Help from another person bathing (including washing, rinsing, drying)?: A Little Help from another person to put on and taking off regular upper body clothing?: A Little Help from another person to put on and taking off regular lower body clothing?: A Little 6 Click Score: 18   End of Session Equipment Utilized During Treatment: Gait belt;Oxygen   Nurse Communication: Mobility status  Activity Tolerance: Patient tolerated treatment well Patient left: in chair;with call bell/phone within reach;with chair alarm set;with family/visitor present  OT Visit Diagnosis: Unsteadiness on feet (R26.81);Other abnormalities of gait and mobility (R26.89);Muscle weakness (generalized) (M62.81)                Time:  8772-8754 OT Time Calculation (min): 18 min Charges:  OT General Charges $OT Visit: 1 Visit OT Evaluation $OT Eval Low Complexity: 1 Low  Mliss NOVAK, OTR/L Acute Rehab Services Office: [phone]   Mliss Fish 09/07/2023, 1:39 PM

## 2023-09-07 NOTE — Plan of Care (Signed)
 Hospital at Home Transfer Note   Blake Mcknight FMW:[id number] DOB: March 17, 1956 DOA: 09/06/2023  PCP: [name], MD      Length of Stay 1  Referred by Attending Blake Purchase, MD   Brief Narrative:   68 y.o. male with medical history significant of COPD and chronic respiratory failure on 2L home O2, hx lung CA s/p SBRT, HTN, HLD, schizophrenia, and DM type II presented with worsening breathing difficulties.  He was recently hospitalized from 5/25 -5/30 after coming in for COPD exacerbation  Patient was was transitioned to H@H  on 5/28 and dc to Home on 5/30   He initially felt well for about three days. However, his breathing difficulties worsened.  His sister noted that he was lying flat in bed, which made it difficult for him to cough up mucus. When he was sitting up in a chair, he had no issues with mucus clearance.   No fever, vomiting, leg swelling, or chest pain.  He has had wheezing.  Reports using home inhalers and breathing treatments without improvement in symptoms.   En route with EMS his initial O2 saturations were in the 70s on home 2 L of nasal cannula oxygen .  Patient was given 2 DuoNeb breathing treatments prior to arrival.     Clinical Course as of 09/07/23 0951  Sun Sep 06, 2023  0726 68 yo male here with SOB - had CT PE 2 days ago in ED with masslike RLL consolidation concerning for cancer, hx of emphysema.  Here on bipap doing better clinically, still wheezing.   Will treat for COPD anticipate admission [MT]    Clinical Course User Index [MT] Trifan, [name], MD      Current consultants: NONE    Consult Orders  (From admission, onward)           Start     Ordered   09/07/23 0948  OT eval and treat  Imminent discharge       Question:  Reason for OT?  Answer:  Plan for discharge hospital home   09/07/23 0947   09/07/23 0948  PT eval and treat  Imminent discharge       Question:  Reason for PT?  Answer:  Plan for discharge hospital home    09/07/23 0947   09/07/23 0700  PT eval and treat  Start Tomorrow       Question:  Reason for PT?  Answer:  copd exacerbation   09/06/23 1605   09/06/23 0740  Consult to hospitalist  Once       Provider:  (Not yet assigned)  Question Answer Comment  Place call to: [facility]   Reason for Consult Admit      09/06/23 0739              Assessment & Plan:   Principal Problem:   Acute on chronic respiratory failure with hypoxia (HCC) Active Problems:   COPD with acute exacerbation (HCC)   HTN (hypertension)   History of lung cancer   Controlled type 2 diabetes mellitus without complication, without long-term current use of insulin  (HCC)   Schizophrenia (HCC)   Normocytic anemia   Hyponatremia   HLD (hyperlipidemia)   BPH (benign prostatic hyperplasia)     Acute on chronic respiratory failure with hypoxia (HCC)  this patient has acute respiratory failure with Hypoxia  as documented by the presence of following: O2 saturatio< 90% on baseline o2 2L Likely due to:   Pneumonia,  COPD exacerbation,  Provide O2 therapy and titrate as needed    flutter valve ordered   COPD with acute exacerbation (HCC) At baseline on 2 L FiO2  - On IV Solu-Medrol   -  Antibiotics Rocephin  IV Today on evaluation seem to have increased work of breathing which improved with breathing treatment receiving Brovana  and budesonide .  - Albuterol   PRN, - scheduled duoneb,    -  Mucinex .  Titrate O2 to saturation >90%. Follow patients respiratory status.  VBG  no evidence of decompensated hypercapnia  - now off  BiPAP     Currently mentating well no evidence of symptomatic hypercarbia   HTN (hypertension) Continue Norvasc  5 mg po daily and Coreg  3.125 mg po BID  History of lung cancer  right lung mass. Seen by pulmonology. Not thought to be a good candidate for EBUS and even treatment going forward  Discussed with sister she says palliative care is suppose to come out to see them on  Tuesday. Discussed overall prognosis with family discussed quality of life and that at some point patient and family may choose to limit hospitalizations    Controlled type 2 diabetes mellitus without complication, without long-term current use of insulin  (HCC) HbA1c 5.9 in May  Recent BG 156 Since patient on IV steroids in there is evidence of significant hyperglycemia may need glipizide short-term while at H@H  program  Schizophrenia (HCC) Continue home medications Cogentin  2 mg p.o. twice daily and Risperdal  3 mg p.o. 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 Can be done as an outpt     Diet  Diet Orders (From admission, onward)     Start     Ordered   09/06/23 1121  Diet Carb Modified Fluid consistency: Thin; Room service appropriate? Yes  Diet effective now       Question Answer Comment  Diet-HS Snack? Nothing   Calorie Level Medium 1600-2000   Fluid consistency: Thin   Room service appropriate? Yes      09/06/23 1120             DVT prophylaxis:  will transition to TED hose when at H@H     Code Status:    Code Status: Limited: Do not attempt resuscitation (DNR) -DNR-LIMITED -Do Not Intubate/DNI      Family Communication:   Family   at  Bedside  plan of care was discussed with   Sister,   [facility] program has been discussed with family     Strong family support noted  Disposition Plan:    discharged from Hospital at Home program to home once   patient is stable and no longer qualifies, PCP and consultant follow up to be  arranged at the time of DC May benefit from transition to Palliative care   Following barriers for discharge:  Work of breathing improved, transition to PO steroids                             Would benefit from PT/OT eval prior to DC  Ordered  Palliative care    consulted            Admission status:  ED Disposition     ED Disposition  Admit   Condition  --   Comment  Hospital Area: [facility] [[id number]]  Level of Care: Progressive [102]  Admit to Progressive based on following criteria: RESPIRATORY PROBLEMS hypoxemic/hypercapnic respiratory failure that is responsive to NIPPV (BiPAP) or High Flow Nasal Cannula (6-80 lpm). Frequent assessment/intervention, no > Q2 hrs < Q4 hrs, to maintain oxygenation and pulmonary hygiene.  May admit patient to [facility] or [facility] if equivalent level of care is available:: No  Covid Evaluation: Asymptomatic - no recent exposure (last 10 days) testing not required  Diagnosis: Acute on chronic respiratory failure with hypoxia [facility]) [[id number]]  Admitting Physician: CLAUDENE MAXIMINO LABOR [[id number]]  Attending Physician: CLAUDENE MAXIMINO LABOR [[id number]]  Certification:: I certify this patient will need inpatient services for at least 2 midnights  Expected Medical Readiness: 09/08/2023           inpatient     To be admitted to the Hospital at [facility] program, a patient generally must meet the following:  I Expect 2 midnight stay secondary to severity of patient's current illness need for inpatient interventions justified by the following:   extensive comorbidities including:   Chronic pain DM2     COPD/asthma   That are currently affecting medical management. I expect  patient to be hospitalized for 2 midnights requiring inpatient medical care. Patient is at high risk for adverse outcome (such as loss of life or disability) if not treated.  Indication  for inpatient stay as follows:       New or worsening hypoxia  Need for IV antibiotics,     1. Requirement for Inpatient Level of Care: The patient's condition must necessitate an inpatient level of care. This is typically indicated by one or more of the following, depending on their specific diagnosis:    - Persistent tachypnea (rapid breathing) or dyspnea (shortness of breath) that hasn't improved sufficiently with observation care (e.g., for Heart Failure, Pneumonia, Viral Illness, COVID).  - Hypoxemia (low oxygen  levels), such as a new need for oxygen , an increased need from baseline, or specific oxygen  saturation levels (e.g., SpO2 <90-94% depending on the condition) that persist despite observation (e.g., for Heart Failure, COPD, Pneumonia, Viral Illness, COVID).    - Specific to COPD: A decrease in known baseline resting oxygen  saturation (SpO2) by 4% or more, or an increase in pre-existing supplemental oxygen  requirements, which persists despite observation and requires continued close monitoring.     _______________________________________________________________________________________________________  Hospital at Home Admission Criteria Checklist    Patient meets inpatient admission criteria     pt medicare FFS     Patient lives within 4 mil/ 30 min from [facility] within [place](pt may stay with family member during admission who lives within 25 miles or 30 min from [facility] w/in [facility])      Hemodynamically stable with relatively low risk of clinical deterioration-not requiring ICU      Age >18     Does not require frequent touch-points or complex interventions/medications (ie Titrated Infusions (IV insulin , heparin drips, vasoactive drips, use  of infused or injectable controlled substances, patients on insulin )?    Any Behavioral Health comorbidities likely to increase risk for in-home care (ie Acute delirium or experiencing a marked altered mental status and  cause is not a treatable condition in the home)?    No active safety concerns (ie Unable to use bedside commode independently and lacks caregiver support for safety- needs SNF placement, unable to obtain IV access)?   Common admission diagnoses including: CAP, COPD Exacerbation, Acute on chronic heart failure, Cellulitis, UTI , dehydration, acute resp failure with hypoxia (requiring <5L)   Social Screening:  Denies significant ETOH intake?   Does not smoke and understands may not smoke in the presence of oxygen ?    Patient states able to use iPad/phone for communication/has family who is able to use?    NO active drug use in patient or primary caregiver including daily dosing of methadone?    Stable home environment ( access to appropriate heating in cold conditions and/or appropriate air conditioning in hot conditions and/or no running water/electricity)?    No aggressive pets at home?   Firearm  Not present  for duration of hospitalization?   Ambulatory?   PT OT eval prito to transition ordered No reported presence of Bed bugs      Needs oxygen  at home  but <5L Family support system in place?   Sister  Patient feels safe at home does not endorse any violence?   No actively decompensated behavioral healthy issues including agitation/aggressive behavior?     Patient requests food to be provided by hospital home program?   No, patient declines  2. Appropriateness for Hospital at Home Setting: The patient's overall clinical picture, including the severity of their illness, their care needs, and their medical history and comorbidities, must be suitable for management in the Hospital at Home environment. This essentially means that none of the exclusion criteria (listed below) are met.  Unified Exclusion Criteria for Hospital at Home Admission: A patient would not be eligible for Hospital at Home if any of the following are present: - Hemodynamic Instability: - Hypotension (low blood  pressure) is present. - Respiratory Instability or Needs Beyond Program Capability: - There is a new need for invasive or noninvasive ventilatory assistance (like BiPAP or a ventilator). Now OFF BIPAP - Oxygenation is not sufficient, generally indicated if an FiO2 (fraction of inspired oxygen ) of 45% (which is about 6 Liters/minute via nasal cannula) or more is required to keep oxygen  saturation (SpO2) at 90% or greater. - Monitoring or Procedural Needs Beyond Program Capability: - There is a need for invasive monitoring, such as a pulmonary artery catheter or an arterial line. - There is a need for immediate-response telemetry monitoring (for dangerous arrhythmia detection and subsequent immediate intervention). - The required medication regimen is beyond the capabilities of Hospital at Home (e.g., dosing intervals are too frequent for home administration). - There is a need for a procedure that cannot be performed by the Hospital at [facility] team (e.g., significant wound debridement or abscess drainage for cellulitis, or percutaneous nephrostomy for a complicated UTI). - Significant Organ Dysfunction or Markers of Severe Illness: - Mental status is not at baseline, or there is altered mental status suggestive of inadequate perfusion. - Renal (kidney) function is unstable or showing an ongoing decline. - There is evidence of inadequate perfusion, such as metabolic acidosis or myocardial ischemia. - Uncompensated acidosis is present. - Condition-Specific Severity or Complications Making Home Care Unsuitable: -For Heart Failure: Known  severe cardiac valvular disease (e.g., aortic stenosis, mitral regurgitation); or severe peripheral edema that impairs the ability to urinate or ambulate. -For COPD: Known concurrent comorbidity or finding that indicates a higher-risk COPD exacerbation (e.g., pulmonary fibrosis, cavitation, pleural effusion, pneumothorax, rib fracture). -For Pneumonia:  Pneumonia Severity Index (PSI) class V (indicating high risk for inpatient mortality); known concurrent comorbidity or finding that indicates higher-risk pneumonia (e.g., pulmonary fibrosis, cavitation, large or loculated pleural effusion); or a concomitant serious infectious process like endocarditis or empyema. -For Cellulitis: Orbital, periorbital, or necrotizing infection is suspected; or a concomitant serious infectious process like endocarditis, septic emboli, or septic joint space infection. -For UTI: Urinary tract obstruction (e.g., kidney stone, bladder outlet obstruction); or a concomitant serious infectious process like endocarditis or septic emboli. -For Viral Illness & COVID-19: A concomitant serious infectious process like endocarditis or empyema. General Comorbidities or Status: -The patient is significantly immunosuppressed (this applies to Pneumonia, Cellulitis, UTI, Viral Illness, and COVID-19). -The patient meets inpatient admission criteria for a second diagnosis, or has care needs beyond the capabilities of Hospital at Home due to an active clinically significant comorbidity. (This is a general exclusion across all listed conditions)  _________________________________________________________________________________   Subjective: Appears to have initial tachypnea improved with breathing treatment then resting    Patient was seen and examined Discussed with patient and family hospital at home program they are excited to participate But requesting for transfer t take place tomorrow Consent signed see below     Objective: Vitals:   09/07/23 0330 09/07/23 0545 09/07/23 0626 09/07/23 0742  BP: 122/75 118/63  128/83  Pulse: 79 72  91  Resp: 15 18  18   Temp:   98.2 F (36.8 C) 98.3 F (36.8 C)  TempSrc:   Oral Oral  SpO2: 100% 98%  97%  Weight:      Height:        Intake/Output Summary (Last 24 hours) at 09/07/2023 0951 Last data filed at 09/07/2023  0900 Gross per 24 hour  Intake 195.51 ml  Output --  Net 195.51 ml   Filed Weights   09/06/23 0636  Weight: 71.7 kg    Examination:  General exam: Appears calm and comfortable  Respiratory system: Clear to auscultation. Respiratory effort normal. Cardiovascular system: S1 & S2 heard, RRR. No JVD, murmurs, rubs, gallops or clicks. No pedal edema. Gastrointestinal system: Abdomen is nondistended, soft and nontender. No organomegaly or masses felt. Normal bowel sounds heard. Central nervous system: Alert and oriented. No focal neurological deficits. Extremities: Symmetric 5 x 5 power. Skin: No rashes, lesions or ulcers Psychiatry: Judgement and insight appear normal. Mood & affect appropriate.    Data Reviewed: I have personally reviewed following labs and imaging studies  CBC: Recent Labs  Lab 09/03/23 1737 09/06/23 0643 09/06/23 0703 09/07/23 0447  WBC 6.5 7.2  --  6.9  NEUTROABS  --  4.3  --   --   HGB 12.5* 12.0* 12.2* 11.9*  HCT 39.0 36.5* 36.0* 36.3*  MCV 84.1 83.1  --  83.6  PLT 367 359  --  337    Basic Metabolic Panel: Recent Labs  Lab 09/03/23 1737 09/06/23 0643 09/06/23 0703 09/07/23 0447  NA 133* 133* 132* 134*  K 4.0 4.0 4.0 4.0  CL 98 97*  --  101  CO2 28 22  --  23  GLUCOSE 89 153*  --  147*  BUN <5* <5*  --  <5*  CREATININE 0.69 0.64  --  0.56*  CALCIUM  9.2 9.1  --  9.0    GFR: Estimated Creatinine Clearance: 83.1 mL/min (A) (by C-G formula based on SCr of 0.56 mg/dL (L)).  Liver Function Tests: Recent Labs  Lab 09/03/23 1737  AST 16  ALT 16  ALKPHOS 71  BILITOT 0.2  PROT 7.3  ALBUMIN 3.0*    CBG: Recent Labs  Lab 09/06/23 1135 09/06/23 1751 09/07/23 0048  GLUCAP 125* 179* 156*    __________ Procedures:    No results found for this or any previous visit (from the past 56199 hours).     CXR - IMPRESSION: 1. Mass-like consolidation in the posterior right lower lobe, similar to prior studies. 2. Right greater than left  pleural effusions, stable. 3. Severe emphysematous changes.    DM  labs:  HbA1C: Recent Labs    08/10/23 0229  HGBA1C 5.9*       CBG (last 3)  Recent Labs    09/06/23 1135 09/06/23 1751 09/07/23 0048  GLUCAP 125* 179* 156*          Cultures:    Component Value Date/Time   SDES BLOOD LEFT ARM 08/09/2023 2049   SPECREQUEST  08/09/2023 2049    BOTTLES DRAWN AEROBIC AND ANAEROBIC Blood Culture results may not be optimal due to an inadequate volume of blood received in culture bottles   CULT  08/09/2023 2049    NO GROWTH 5 DAYS Performed at [facility], [address], [place], [place] [address]    REPTSTATUS 08/14/2023 FINAL 08/09/2023 2049     Radiological Exams on Admission: DG Chest Port 1 View Result Date: 09/06/2023 EXAM: 1 VIEW XRAY OF THE CHEST 09/06/2023 07:10:00 AM COMPARISON: 2-view chest x-ray 09/03/2023 and CT angio of chest 09/04/2023. CLINICAL HISTORY: SOB FINDINGS: LUNGS AND PLEURA: Severe emphysematous changes are again noted. Mass-like consolidation in the posterior right lower lobe is similar to the prior studies. Right greater than left pleural effusions are stable. HEART AND MEDIASTINUM: No acute abnormality of the cardiac and mediastinal silhouettes. BONES AND SOFT TISSUES: No acute osseous abnormality. IMPRESSION: 1. Mass-like consolidation in the posterior right lower lobe, similar to prior studies. 2. Right greater than left pleural effusions, stable. 3. Severe emphysematous changes. Electronically signed by: Lonni Necessary MD 09/06/2023 07:23 AM EDT RP Workstation: [id number]   No results found for this or any previous visit (from the past 240 hours).   Antimicrobials: ABX started Antibiotics Given (last 72 hours)     Date/Time Action Medication Dose Rate   09/06/23 1057 New Bag/Given   cefTRIAXone  (ROCEPHIN ) 1 g in sodium chloride  0.9 % 100 mL IVPB 1 g 200 mL/hr   09/07/23 0756 New Bag/Given   cefTRIAXone  (ROCEPHIN ) 1 g in sodium  chloride 0.9 % 100 mL IVPB 1 g 200 mL/hr          Scheduled Meds:  amLODipine   5 mg Oral Daily   arformoterol   15 mcg Nebulization BID   atorvastatin   10 mg Oral QHS   benztropine   2 mg Oral BID   budesonide  (PULMICORT ) nebulizer solution  0.5 mg Nebulization BID   carvedilol   3.125 mg Oral BID WC   enoxaparin  (LOVENOX ) injection  40 mg Subcutaneous Q24H   famotidine   40 mg Oral BID   guaiFENesin   600 mg Oral BID   insulin  aspart  0-9 Units Subcutaneous TID WC   ipratropium-albuterol   3 mL Nebulization QID   lidocaine   1 patch Transdermal Q24H   methylPREDNISolone  (SOLU-MEDROL ) injection  40 mg  Intravenous Q12H   mirtazapine   30 mg Oral QHS   risperiDONE   3 mg Oral BID   sodium chloride  flush  3 mL Intravenous Q12H   tamsulosin   0.4 mg Oral BID   Continuous Infusions:  cefTRIAXone  (ROCEPHIN )  IV Stopped (09/07/23 0826)     LOS: 1 day    Time spent: 120 min   Blease Quiver, MD [facility]   09/07/2023, 9:51 AM

## 2023-09-07 NOTE — Progress Notes (Signed)
 [facility] PROGRESS NOTE   Blake Mcknight FMW:[id number] DOB: 1955/11/03 DOA: 09/06/2023  PCP: [name], MD  Brief History: 68 y.o. male with medical history significant of COPD and chronic respiratory failure on 2L home O2, hx lung CA s/p SBRT, HTN, HLD, schizophrenia, and DM type II presented with worsening breathing difficulties.  He was recently hospitalized from 5/25 -5/30 after coming in for COPD exacerbation with SIRS treated with steroids breathing treatments and empiric antibiotics with improvement in symptoms prior to discharge.  Patient was hospitalized for further management.  Consultants: None yet  Procedures: None    Subjective/Interval History: Patient states that he is feeling better.  His sister is at the bedside.  Patient denies any chest pain shortness of breath.  No nausea vomiting.    Assessment/Plan:  COPD with acute exacerbation/acute on chronic respiratory failure with hypoxia Uses oxygen  at home at 2 L/min. Came in with shortness of breath.  Was noted to be wheezing.  Was placed on BiPAP by the emergency department.  He was given Solu-Medrol . Patient has been taken off of BiPAP.  Seems to be comfortable this morning. He is noted to be on ceftriaxone .  He is on Solu-Medrol  40 mg twice a day.  He is getting nebulizer treatments. Patient is also receiving Brovana  and budesonide .  Lung mass/history of lung cancer Patient has a right lung mass.  Seen by pulmonology.  Not thought to be a good candidate for EBUS and even treatment going forward.  Sister is aware of this. It looks like palliative care is supposed to see the patient in the outpatient setting but unclear if this has happened or not.  Soft tissue mass within duodenal bulb This was noted on CT scan of the abdomen pelvis which was done earlier on June 19.  Patient denies any abdominal pain currently.  Sister mentions that he does complain of abdominal pain at times.  He has  tenderness respiratory status so endoscopy will be challenging.  He already has a lung mass which is thought to be cancerous.  Right now the lesion does not seem to be bothering him.  He is able to tolerate his diet.  At some point he may benefit from being seen by gastroenterology.  Essential hypertension Stable.  His antihypertensives are being continued including amlodipine  and carvedilol .  Diabetes mellitus type 2, controlled HbA1c 5.9 in May.  Monitor CBGs.  History of schizophrenia Continue risperidone  and benztropine .  Normocytic anemia Stable.  Hyperlipidemia Continue Lipitor  BPH Continue Flomax .   DVT Prophylaxis: Lovenox  Code Status: DNR Family Communication: Discussed with the sister Disposition Plan: To be determined  Status is: Inpatient Remains inpatient appropriate because: COPD exacerbation      Medications: Scheduled:  amLODipine   5 mg Oral Daily   arformoterol   15 mcg Nebulization BID   atorvastatin   10 mg Oral QHS   benztropine   2 mg Oral BID   budesonide  (PULMICORT ) nebulizer solution  0.5 mg Nebulization BID   carvedilol   3.125 mg Oral BID WC   enoxaparin  (LOVENOX ) injection  40 mg Subcutaneous Q24H   famotidine   40 mg Oral BID   guaiFENesin   600 mg Oral BID   insulin  aspart  0-9 Units Subcutaneous TID WC   ipratropium-albuterol   3 mL Nebulization QID   lidocaine   1 patch Transdermal Q24H   methylPREDNISolone  (SOLU-MEDROL ) injection  40 mg Intravenous Q12H   mirtazapine   30 mg Oral QHS   risperiDONE   3 mg Oral BID  sodium chloride  flush  3 mL Intravenous Q12H   tamsulosin   0.4 mg Oral BID   Continuous:  cefTRIAXone  (ROCEPHIN )  IV Stopped (09/07/23 0826)   PRN:acetaminophen  **OR** acetaminophen , albuterol , mouth rinse, polyethylene glycol, traMADol   Antibiotics: Anti-infectives (From admission, onward)    Start     Dose/Rate Route Frequency Ordered Stop   09/06/23 0815  cefTRIAXone  (ROCEPHIN ) 1 g in sodium chloride  0.9 % 100 mL IVPB         1 g 200 mL/hr over 30 Minutes Intravenous Every 24 hours 09/06/23 0803 09/11/23 0759       Objective:  Vital Signs  Vitals:   09/07/23 0330 09/07/23 0545 09/07/23 0626 09/07/23 0742  BP: 122/75 118/63  128/83  Pulse: 79 72  91  Resp: 15 18  18   Temp:   98.2 F (36.8 C) 98.3 F (36.8 C)  TempSrc:   Oral Oral  SpO2: 100% 98%  97%  Weight:      Height:       No intake or output data in the 24 hours ending 09/07/23 0915 Filed Weights   09/06/23 0636  Weight: 71.7 kg    General appearance: Awake alert.  In no distress Resp: Diminished air entry at the bases.  Occasional wheezing.  No rhonchi.  No definite crackles. Cardio: S1-S2 is normal regular.  No S3-S4.  No rubs murmurs or bruit GI: Abdomen is soft.  Nontender nondistended.  Bowel sounds are present normal.  No masses organomegaly Extremities: No edema.  Full range of motion of lower extremities. Neurologic: No obvious focal neurological deficits.  Lab Results:  Data Reviewed: I have personally reviewed following labs and reports of the imaging studies  CBC: Recent Labs  Lab 09/03/23 1737 09/06/23 0643 09/06/23 0703 09/07/23 0447  WBC 6.5 7.2  --  6.9  NEUTROABS  --  4.3  --   --   HGB 12.5* 12.0* 12.2* 11.9*  HCT 39.0 36.5* 36.0* 36.3*  MCV 84.1 83.1  --  83.6  PLT 367 359  --  337    Basic Metabolic Panel: Recent Labs  Lab 09/03/23 1737 09/06/23 0643 09/06/23 0703 09/07/23 0447  NA 133* 133* 132* 134*  K 4.0 4.0 4.0 4.0  CL 98 97*  --  101  CO2 28 22  --  23  GLUCOSE 89 153*  --  147*  BUN <5* <5*  --  <5*  CREATININE 0.69 0.64  --  0.56*  CALCIUM  9.2 9.1  --  9.0    GFR: Estimated Creatinine Clearance: 83.1 mL/min (A) (by C-G formula based on SCr of 0.56 mg/dL (L)).  Liver Function Tests: Recent Labs  Lab 09/03/23 1737  AST 16  ALT 16  ALKPHOS 71  BILITOT 0.2  PROT 7.3  ALBUMIN 3.0*    Recent Labs  Lab 09/03/23 1737  LIPASE 25    CBG: Recent Labs  Lab  09/06/23 1135 09/06/23 1751 09/07/23 0048  GLUCAP 125* 179* 156*     Radiology Studies: DG Chest Port 1 View Result Date: 09/06/2023 EXAM: 1 VIEW XRAY OF THE CHEST 09/06/2023 07:10:00 AM COMPARISON: 2-view chest x-ray 09/03/2023 and CT angio of chest 09/04/2023. CLINICAL HISTORY: SOB FINDINGS: LUNGS AND PLEURA: Severe emphysematous changes are again noted. Mass-like consolidation in the posterior right lower lobe is similar to the prior studies. Right greater than left pleural effusions are stable. HEART AND MEDIASTINUM: No acute abnormality of the cardiac and mediastinal silhouettes. BONES AND SOFT TISSUES: No acute osseous  abnormality. IMPRESSION: 1. Mass-like consolidation in the posterior right lower lobe, similar to prior studies. 2. Right greater than left pleural effusions, stable. 3. Severe emphysematous changes. Electronically signed by: Lonni Necessary MD 09/06/2023 07:23 AM EDT RP Workstation: [id number]       LOS: 1 day   Joette Pebbles  [facility] Pager on [email or web address]  09/07/2023, 9:15 AM

## 2023-09-07 NOTE — Consult Note (Signed)
 Consultation Note Date: 09/07/2023   Patient Name: Blake Mcknight  DOB: 1956/03/11  MRN: [id number]  Age / Sex: 68 y.o., male  PCP: [name], MD Referring Physician: Verdene Purchase, MD  Reason for Consultation:  goals of care transition to hospital at home and then palliative  HPI/Patient Profile: 68 y.o. male  with past medical history of lung mass s/p SBRT with evidence of recurrence- not candidate for biopsy or treatment admitted on 09/06/2023 with COPD exacerbation. Palliative consulted for GOC.   Primary Decision Maker NEXT OF KIN  Discussion: Chart reviewed including labs, progress notes, imaging from this and previous encounters.  CT scans reviewed- noted slight enlargement of mass this admission.  There is also a new finding of possible soft tissue mass in duodenum on CT CAP.  On eval patient sleeping- did not awake to my voice.  Per chart review- patient is pending discharge to hospital at home program.  There is also a note by [facility]- patient is enrolled in their integrative health program for Palliative services.  I messaged [facility] liaison to inquire about previous goals of care discussion- per Melissa there is no documentation regarding goals of care. Patient has had one virtual intake visit and has an in person visit scheduled for when he discharges.  I called patient's sister and left a message requesting return call.    SUMMARY OF RECOMMENDATIONS -COPD exacerbation, lung mass- improving- plan to d/c home with hospital at home program -Recommend GOC discussion to be done at home with [facility] Palliative as planned -Please call [facility] inpt Palliative team if acute Palliative needs arise    Code Status/Advance Care Planning:   Code Status: Limited: Do not attempt resuscitation (DNR) -DNR-LIMITED -Do Not Intubate/DNI     Prognosis:   Unable to  determine  Discharge Planning: pt discharging to hospital at home, recommend followup with oupatient Palliatie- [facility] as planned  Primary Diagnoses: Present on Admission:  Acute on chronic respiratory failure with hypoxia (HCC)  HTN (hypertension)  HLD (hyperlipidemia)  Schizophrenia (HCC)  Hyponatremia  COPD with acute exacerbation (HCC)  Normocytic anemia  BPH (benign prostatic hyperplasia)  Duodenal mass  Acute respiratory failure with hypoxia (HCC)  COPD exacerbation (HCC)   Review of Systems  Physical Exam Vitals and nursing note reviewed.  Pulmonary:     Effort: Pulmonary effort is normal.   Neurological:     Comments: sleeping    Vital Signs: BP 128/83 (BP Location: Right Arm)   Pulse 95   Temp 98.3 F (36.8 C) (Oral)   Resp 18   Ht 5' 6 (1.676 m)   Wt 71.7 kg   SpO2 98%   BMI 25.50 kg/m  Pain Scale: 0-10   Pain Score: 0-No pain   SpO2: SpO2: 98 % O2 Device:SpO2: 98 % O2 Flow Rate: .O2 Flow Rate (L/min): 2 L/min  IO: Intake/output summary:  Intake/Output Summary (Last 24 hours) at 09/07/2023 1444 Last data filed at 09/07/2023 1218 Gross per 24 hour  Intake 435.51 ml  Output 300  ml  Net 135.51 ml    LBM: Last BM Date : 09/05/23 Baseline Weight: Weight: 71.7 kg Most recent weight: Weight: 71.7 kg       Thank you for this consult. Palliative medicine will continue to follow and assist as needed.  Time Total: 60 minutes Signed by: Cassondra Stain, AGNP-C Palliative Medicine  Time includes:   Preparing to see the patient (e.g., review of tests) Obtaining and/or reviewing separately obtained history Performing a medically necessary appropriate examination and/or evaluation Counseling and educating the patient/family/caregiver Ordering medications, tests, or procedures Referring and communicating with other health care professionals (when not reported separately) Documenting clinical information in the electronic or other health  record Independently interpreting results (not reported separately) and communicating results to the patient/family/caregiver Care coordination (not reported separately) Clinical documentation   Please contact Palliative Medicine Team phone at [phone] for questions and concerns.  For individual provider: See Tracey

## 2023-09-07 NOTE — Assessment & Plan Note (Signed)
 Sodium 134 this morning stable

## 2023-09-07 NOTE — Assessment & Plan Note (Addendum)
 At baseline on 2 L FiO2  - On IV Solu-Medrol   -  Antibiotics Rocephin  IV Today on evaluation seem to have increased work of breathing which improved with breathing treatment receiving Brovana  and budesonide .  - Albuterol   PRN, - scheduled duoneb,    -  Mucinex .  Titrate O2 to saturation >90%. Follow patients respiratory status.  VBG  no evidence of decompensated hypercapnia  - now off  BiPAP     Currently mentating well no evidence of symptomatic hypercarbia

## 2023-09-07 NOTE — ED Notes (Signed)
 Pt OTF with transport, no new onset distress.

## 2023-09-07 NOTE — Assessment & Plan Note (Signed)
 Continue Norvasc  5 mg po daily and Coreg  3.125 mg po BID

## 2023-09-07 NOTE — Progress Notes (Signed)
 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   [facility] will continue to follow for discharge disposition.   Please call with any Integrated Health Services related questions or concerns.   Thank you, Eleanor Nail, LPN [phone]

## 2023-09-07 NOTE — Subjective & Objective (Signed)
 68 y.o. male with medical history significant of COPD and chronic respiratory failure on 2L home O2, hx lung CA s/p SBRT, HTN, HLD, schizophrenia, and DM type II presented with worsening breathing difficulties.  He was recently hospitalized from 5/25 -5/30 after coming in for COPD exacerbation  Patient was was transitioned to H@H  on 5/28 and dc to Home on 5/30   He initially felt well for about three days. However, his breathing difficulties worsened.  His sister noted that he was lying flat in bed, which made it difficult for him to cough up mucus. When he was sitting up in a chair, he had no issues with mucus clearance.   No fever, vomiting, leg swelling, or chest pain.  He has had wheezing.  Reports using home inhalers and breathing treatments without improvement in symptoms.   En route with EMS his initial O2 saturations were in the 70s on home 2 L of nasal cannula oxygen .  Patient was given 2 DuoNeb breathing treatments prior to arrival.

## 2023-09-07 NOTE — Progress Notes (Signed)
   09/07/23 2044  BiPAP/CPAP/SIPAP  Reason BIPAP/CPAP not in use Non-compliant (pt refused at this time.)

## 2023-09-07 NOTE — Assessment & Plan Note (Signed)
 Continue Flomax  0.4 mg 2 times daily

## 2023-09-07 NOTE — Assessment & Plan Note (Signed)
 Chronic stable.

## 2023-09-07 NOTE — Assessment & Plan Note (Signed)
 Continue Lipitor 10 mg daily at bedtime

## 2023-09-08 ENCOUNTER — Other Ambulatory Visit (HOSPITAL_COMMUNITY): Payer: Self-pay

## 2023-09-08 DIAGNOSIS — J441 Chronic obstructive pulmonary disease with (acute) exacerbation: Secondary | ICD-10-CM | POA: Diagnosis not present

## 2023-09-08 DIAGNOSIS — J9621 Acute and chronic respiratory failure with hypoxia: Secondary | ICD-10-CM | POA: Diagnosis not present

## 2023-09-08 LAB — GLUCOSE, CAPILLARY
Glucose-Capillary: 233 mg/dL — ABNORMAL HIGH (ref 70–99)
Glucose-Capillary: 160 mg/dL — ABNORMAL HIGH (ref 70–99)

## 2023-09-08 MED ORDER — ONDANSETRON HCL 4 MG PO TABS
4.0000 mg | ORAL_TABLET | Freq: Four times a day (QID) | ORAL | Status: DC | PRN
  Filled 2023-09-08: qty 1

## 2023-09-08 MED ORDER — IPRATROPIUM-ALBUTEROL 0.5-2.5 (3) MG/3ML IN SOLN
3.0000 mL | RESPIRATORY_TRACT | Status: DC | PRN
  Administered 2023-09-09 – 2023-09-10 (×3): 3 mL via RESPIRATORY_TRACT
  Filled 2023-09-08 (×9): qty 3

## 2023-09-08 MED ORDER — ACETAMINOPHEN 325 MG PO TABS
650.0000 mg | ORAL_TABLET | Freq: Four times a day (QID) | ORAL | Status: DC | PRN
Start: 2023-09-08 — End: 2023-09-11
  Filled 2023-09-08: qty 2

## 2023-09-08 MED ORDER — AZITHROMYCIN 500 MG PO TABS: 500.0000 mg | ORAL_TABLET | Freq: Every day | ORAL | Status: DC

## 2023-09-08 MED ORDER — MIRTAZAPINE 30 MG PO TABS
30.0000 mg | ORAL_TABLET | Freq: Every day | ORAL | Status: DC
  Administered 2023-09-08 – 2023-09-09 (×2): 30 mg via ORAL
  Filled 2023-09-08 (×2): qty 1
  Filled 2023-09-08: qty 2

## 2023-09-08 MED ORDER — PREDNISONE 20 MG PO TABS
40.0000 mg | ORAL_TABLET | Freq: Every day | ORAL | Status: DC
  Administered 2023-09-09 – 2023-09-10 (×2): 40 mg via ORAL
  Filled 2023-09-08 (×3): qty 2

## 2023-09-08 MED ORDER — SODIUM CHLORIDE 0.9 % IV SOLN: 500.0000 mg | INTRAVENOUS | Status: DC

## 2023-09-08 MED ORDER — TRAMADOL HCL 50 MG PO TABS: 50.0000 mg | ORAL_TABLET | Freq: Four times a day (QID) | ORAL | 0 refills | Status: AC | PRN

## 2023-09-08 MED ORDER — AZITHROMYCIN 500 MG PO TABS
500.0000 mg | ORAL_TABLET | Freq: Every day | ORAL | Status: AC
  Administered 2023-09-09 – 2023-09-10 (×2): 500 mg via ORAL
  Filled 2023-09-08: qty 2
  Filled 2023-09-08: qty 1

## 2023-09-08 NOTE — Plan of Care (Addendum)
 Overall plan of care discussed w/ sister over zoom link. She reports not being able to follow up from recent hospitalization due to recent surgery.  Discussed follow up with [facility] for patient needs including palliative care vs. hospice. Sister somewhat tearful but in agreement.  Social work to follow up in am.

## 2023-09-08 NOTE — Progress Notes (Signed)
 Attempted call to sister to introduce myself an set up plan for medications. No answer. Text sent. Will try again.

## 2023-09-08 NOTE — Progress Notes (Signed)
 Patient remains at baseline cognition and ADLs, lives in home with sister who helps him perform most of his ADLS while there

## 2023-09-08 NOTE — Progress Notes (Signed)
 Pt transferred from 3 east to home via wheelchair and wheelchair van on portable O2 without incident.  Ambulated up 3 step stoop with assistance and sat in bed for assessment.  Oxygen  concentrator set up and patient transferred from portable to O2.  Home safety assessment performed and no issues noted.  The home is small and compact, however, there are clear hallways and paths for him to move from bed to bathroom and urinal was also provided. The home is well lit and has running AC.  Pt is no acute distress on admission, resting in a sitting position in bed.  GCS 15, pupils PEARL, Airway patent, respirations even/unlabored, lung fields are diminished bilaterally with expiratory wheezes, pulses are equal and present x4, abd soft/flat/nontender, no masses palpated, bowel sounds active x4, skin is warm/dry and intact. no peripheral edema noted. Tachycardic at 103, otherwise VSS.  Pt and family educated on program goals/objectives of this hospitalization and verbalized understating.  Video visit with admitting provider and [facility] RN conducted.  Pt and sister given [facility] phone number and instructed how to use tablet as well in the event they need to call for assistance.  No other needs noted at this time, all questions answered prior to departure.

## 2023-09-08 NOTE — Plan of Care (Addendum)
 Hospital at Home Progress Note    Blake Mcknight   FMW:[id number]  DOB: 29-Sep-1955  DOA: 09/06/2023     2 Date of Service: 09/08/2023     Subjective:  68 y.o. male with medical history significant of COPD and chronic respiratory failure on 2L home O2, hx lung CA s/p SBRT, HTN, HLD, schizophrenia, and DM type II presented with worsening breathing difficulties.  He was recently hospitalized from 5/25 -5/30 after coming in for COPD exacerbation with SIRS treated with steroids breathing treatments and empiric antibiotics with improvement in symptoms prior to discharge.  Patient was hospitalized for further management.   Hospital Problems Assessment and Plan:  COPD with acute exacerbation/acute on chronic respiratory failure with hypoxia Uses oxygen  at home at 2 L/min currently at baseline though w/ minimal to mild increased WOB  Came in with shortness of breath.  Was noted to be wheezing.  Was placed on BiPAP by the emergency department.  He was given Solu-Medrol . Subsequently taken off of BiPAP.  Improvement noted.   He is noted to be on ceftriaxone .  He is on Solu-Medrol  40 mg twice a day.  He is getting nebulizer treatments. Patient is also receiving Brovana  and budesonide . Transitioning to prednisone  and azithromycin  today    Lung mass/history of lung cancer Patient has a right lung mass.  Seen by pulmonology.  Not thought to be a good candidate for EBUS and even treatment going forward.  Sister is aware of this. Palliative care to follow at home. Per the sister, she has tentative call with [facility] later today.  Overall goal is fully establish care with [facility] for palliative vs. Hospice services in setting of likely end stage COPD w/ lung cancer and soft tissue mass within duodenal bulb.    Soft tissue mass within duodenal bulb This was noted on CT scan of the abdomen pelvis which was done earlier on June 19.  Patient denies any abdominal pain currently.  Sister mentions  that he does complain of abdominal pain at times.  He has tenuous respiratory status so endoscopy will be challenging-suspect he may not be a candidate.  He already has a lung mass which is thought to be cancerous.  Right now the duodenal lesion does not seem to be bothering him.  He is able to tolerate his diet.   Consider outpatient follow up with GI  as appropriate.    Essential hypertension Stable.  His antihypertensives are being continued including amlodipine  and carvedilol .   Diabetes mellitus type 2, controlled HbA1c 5.9 in May.  Monitor CBGs.   History of schizophrenia Continue risperidone  and benztropine .   Normocytic anemia Stable.   Hyperlipidemia Continue Lipitor   BPH Continue Flomax .     DVT Prophylaxis: Lovenox  Code Status: DNR Family Communication: Discussed with the sister.  Had lengthy discussion with daughter over the phone. Overall goal is fully establish care with [facility] for palliative vs. Hospice services in setting of likely end stage COPD w/ lung cancer and soft tissue mass within duodenal bulb.  Disposition Plan: Plan is for transfer to hospital at home.  Formal consent obtained yesterday.        Objective Vital signs were reviewed and unremarkable. Physical Exam Constitutional:      Comments: Underweight    HENT:     Head: Normocephalic and atraumatic.     Nose: Nose normal.   Eyes:     Pupils: Pupils are equal, round, and reactive to light.    Cardiovascular:  Rate and Rhythm: Normal rate.  Pulmonary:     Effort: Pulmonary effort is normal.   Musculoskeletal:        General: Normal range of motion.   Skin:    General: Skin is warm.   Neurological:     General: No focal deficit present.   Psychiatric:        Mood and Affect: Mood normal.       Labs / Other Information There are no new results to review at this time.  DG Chest Port 1 View EXAM: 1 VIEW XRAY OF THE CHEST 09/06/2023 07:10:00  AM  COMPARISON: 2-view chest x-ray 09/03/2023 and CT angio of chest 09/04/2023.  CLINICAL HISTORY: SOB  FINDINGS:  LUNGS AND PLEURA: Severe emphysematous changes are again noted. Mass-like consolidation in the posterior right lower lobe is similar to the prior studies. Right greater than left pleural effusions are stable.  HEART AND MEDIASTINUM: No acute abnormality of the cardiac and mediastinal silhouettes.  BONES AND SOFT TISSUES: No acute osseous abnormality.  IMPRESSION: 1. Mass-like consolidation in the posterior right lower lobe, similar to prior studies. 2. Right greater than left pleural effusions, stable. 3. Severe emphysematous changes.  Electronically signed by: Lonni Necessary MD 09/06/2023 07:23 AM EDT RP Workstation: [id number]   Lab Results  Component Value Date   WBC 6.9 09/07/2023   HGB 11.9 (L) 09/07/2023   HCT 36.3 (L) 09/07/2023   MCV 83.6 09/07/2023   PLT 337 09/07/2023   Last metabolic panel Lab Results  Component Value Date   GLUCOSE 147 (H) 09/07/2023   NA 134 (L) 09/07/2023   K 4.0 09/07/2023   CL 101 09/07/2023   CO2 23 09/07/2023   BUN <5 (L) 09/07/2023   CREATININE 0.56 (L) 09/07/2023   GFRNONAA >60 09/07/2023   CALCIUM  9.0 09/07/2023   PHOS 2.8 05/29/2016   PROT 7.3 09/03/2023   ALBUMIN 3.0 (L) 09/03/2023   BILITOT 0.2 09/03/2023   ALKPHOS 71 09/03/2023   AST 16 09/03/2023   ALT 16 09/03/2023   ANIONGAP 10 09/07/2023     Time spent: 60 minutes.  [facility] 09/08/2023, 1:24 PM

## 2023-09-08 NOTE — Care Management (Signed)
 [facility] 4pm huddle the team discussed the importance of coordinating discharge HH services/ [facility].  Last admission the services were arranged, but patient's sister was having surgery and she was not able to assist with coordinating the services.  Discussed this with [facility] ED RNCM handoff given she will follow up with discharge care coordination plans in the morning.   Albert Gosling RN, BSN CNOR ED RN [phone] Care Manager [phone]

## 2023-09-08 NOTE — Progress Notes (Signed)
 [facility] PROGRESS NOTE   Blake Mcknight FMW:[id number] DOB: December 29, 1955 DOA: 09/06/2023  PCP: [name], MD  Brief History: 68 y.o. male with medical history significant of COPD and chronic respiratory failure on 2L home O2, hx lung CA s/p SBRT, HTN, HLD, schizophrenia, and DM type II presented with worsening breathing difficulties.  He was recently hospitalized from 5/25 -5/30 after coming in for COPD exacerbation with SIRS treated with steroids breathing treatments and empiric antibiotics with improvement in symptoms prior to discharge.  Patient was hospitalized for further management.  Consultants: None yet  Procedures: None    Subjective/Interval History: Patient mentions that he is feeling better.  Denies any new complaints.  Shortness of breath is significantly improved.    Assessment/Plan:  COPD with acute exacerbation/acute on chronic respiratory failure with hypoxia Uses oxygen  at home at 2 L/min. Came in with shortness of breath.  Was noted to be wheezing.  Was placed on BiPAP by the emergency department.  He was given Solu-Medrol . Subsequently taken off of BiPAP.  Improvement noted.   He is noted to be on ceftriaxone .  He is on Solu-Medrol  40 mg twice a day.  He is getting nebulizer treatments. Patient is also receiving Brovana  and budesonide .  Lung mass/history of lung cancer Patient has a right lung mass.  Seen by pulmonology.  Not thought to be a good candidate for EBUS and even treatment going forward.  Sister is aware of this. Palliative care to follow at home.  Soft tissue mass within duodenal bulb This was noted on CT scan of the abdomen pelvis which was done earlier on June 19.  Patient denies any abdominal pain currently.  Sister mentions that he does complain of abdominal pain at times.  He has tenuous respiratory status so endoscopy will be challenging.  He already has a lung mass which is thought to be cancerous.  Right now the duodenal  lesion does not seem to be bothering him.  He is able to tolerate his diet.  At some point he may benefit from being seen by gastroenterology.  Essential hypertension Stable.  His antihypertensives are being continued including amlodipine  and carvedilol .  Diabetes mellitus type 2, controlled HbA1c 5.9 in May.  Monitor CBGs.  History of schizophrenia Continue risperidone  and benztropine .  Normocytic anemia Stable.  Hyperlipidemia Continue Lipitor  BPH Continue Flomax .   DVT Prophylaxis: Lovenox  Code Status: DNR Family Communication: Discussed with the sister Disposition Plan: Plan is for transfer to hospital at home.  Discussed with Dr. Eldonna.      Medications: Scheduled:  amLODipine   5 mg Oral Daily   arformoterol   15 mcg Nebulization BID   atorvastatin   10 mg Oral QHS   benztropine   2 mg Oral BID   budesonide  (PULMICORT ) nebulizer solution  0.5 mg Nebulization BID   carvedilol   3.125 mg Oral BID WC   enoxaparin  (LOVENOX ) injection  40 mg Subcutaneous Q24H   famotidine   40 mg Oral BID   guaiFENesin   600 mg Oral BID   insulin  aspart  0-9 Units Subcutaneous TID WC   ipratropium-albuterol   3 mL Nebulization QID   lidocaine   1 patch Transdermal Q24H   mirtazapine   30 mg Oral QHS   risperiDONE   3 mg Oral BID   sodium chloride  flush  3 mL Intravenous Q12H   tamsulosin   0.4 mg Oral BID   Continuous:  cefTRIAXone  (ROCEPHIN )  IV 1 g (09/08/23 0852)   PRN:albuterol , mouth rinse, polyethylene glycol, traMADol   Antibiotics:  Anti-infectives (From admission, onward)    Start     Dose/Rate Route Frequency Ordered Stop   09/06/23 0815  cefTRIAXone  (ROCEPHIN ) 1 g in sodium chloride  0.9 % 100 mL IVPB        1 g 200 mL/hr over 30 Minutes Intravenous Every 24 hours 09/06/23 0803 09/11/23 0759       Objective:  Vital Signs  Vitals:   09/08/23 0453 09/08/23 0745 09/08/23 0858 09/08/23 0859  BP: 117/72 113/79 113/79 113/79  Pulse: 96 91  98  Resp: 17 19    Temp:  98.5 F (36.9 C) 97.6 F (36.4 C)    TempSrc: Oral Oral    SpO2: 97% 100%    Weight: 57.2 kg     Height:        Intake/Output Summary (Last 24 hours) at 09/08/2023 0928 Last data filed at 09/08/2023 0847 Gross per 24 hour  Intake 1203 ml  Output 3350 ml  Net -2147 ml   Filed Weights   09/06/23 0636 09/08/23 0453  Weight: 71.7 kg 57.2 kg    General appearance: Awake alert.  In no distress Resp: Clear to auscultation bilaterally.  Normal effort Cardio: S1-S2 is normal regular.  No S3-S4.  No rubs murmurs or bruit GI: Abdomen is soft.  Nontender nondistended.  Bowel sounds are present normal.  No masses organomegaly   Lab Results:  Data Reviewed: I have personally reviewed following labs and reports of the imaging studies  CBC: Recent Labs  Lab 09/03/23 1737 09/06/23 0643 09/06/23 0703 09/07/23 0447  WBC 6.5 7.2  --  6.9  NEUTROABS  --  4.3  --   --   HGB 12.5* 12.0* 12.2* 11.9*  HCT 39.0 36.5* 36.0* 36.3*  MCV 84.1 83.1  --  83.6  PLT 367 359  --  337    Basic Metabolic Panel: Recent Labs  Lab 09/03/23 1737 09/06/23 0643 09/06/23 0703 09/07/23 0447  NA 133* 133* 132* 134*  K 4.0 4.0 4.0 4.0  CL 98 97*  --  101  CO2 28 22  --  23  GLUCOSE 89 153*  --  147*  BUN <5* <5*  --  <5*  CREATININE 0.69 0.64  --  0.56*  CALCIUM  9.2 9.1  --  9.0    GFR: Estimated Creatinine Clearance: 74.5 mL/min (A) (by C-G formula based on SCr of 0.56 mg/dL (L)).  Liver Function Tests: Recent Labs  Lab 09/03/23 1737  AST 16  ALT 16  ALKPHOS 71  BILITOT 0.2  PROT 7.3  ALBUMIN 3.0*    Recent Labs  Lab 09/03/23 1737  LIPASE 25    CBG: Recent Labs  Lab 09/07/23 0738 09/07/23 1125 09/07/23 1626 09/07/23 2050 09/08/23 0602  GLUCAP 144* 180* 147* 117* 233*     Radiology Studies: No results found.      LOS: 2 days   Bernice Mullin Verdene  [facility] Pager on [email or web address]  09/08/2023, 9:28 AM

## 2023-09-08 NOTE — Progress Notes (Signed)
 Video conference call with patient, his sister, Dr. Eldonna, and paramedic team in the home. Patient alert and oriented, no c/o pain, skin checked with Marolyn RN, and med rec completed. Sister will be with the patient overnight. Reinforced that they can call virtual nurse at any time during the night for assistance.

## 2023-09-08 NOTE — Progress Notes (Signed)
 Patients safety assessment done in home and bed is already in low position, no immediate fall risks or rugs in way of patient.  Tech within reach to notify virtual hub if help is needed and hub number posted in home.

## 2023-09-09 ENCOUNTER — Telehealth: Payer: Self-pay

## 2023-09-09 MED ORDER — LINAGLIPTIN 5 MG PO TABS
5.0000 mg | ORAL_TABLET | Freq: Every day | ORAL | Status: DC
  Administered 2023-09-09 – 2023-09-10 (×2): 5 mg via ORAL
  Filled 2023-09-09 (×3): qty 1

## 2023-09-09 MED ORDER — ENSURE PLUS HIGH PROTEIN PO LIQD
237.0000 mL | Freq: Two times a day (BID) | ORAL | Status: DC
  Administered 2023-09-10: 237 mL via ORAL
  Filled 2023-09-09 (×3): qty 237

## 2023-09-09 NOTE — TOC Progression Note (Addendum)
 Transition of Care [facility]) - Progression Note    Patient Details  Name: Blake Mcknight MRN: [id number] Date of Birth: Dec 18, 1955  Transition of Care [facility]) CM/SW Contact  Corean JAYSON Canary, RN Phone Number: 09/09/2023, 5:07 PM  Clinical Narrative:     Confirmed that Cane and 3:1 was received  by [facility] and they will deliver to home on next visit.Patient is active with [facility] for RN and PT, orders updated.  No further needs identified, may DC tomorrow.  TOC will follow for needs  Expected Discharge Plan: Home/Self Care Barriers to Discharge: Continued Medical Work up  Expected Discharge Plan and Services In-house Referral: Hospice / Palliative Care Discharge Planning Services: CM Consult   Living arrangements for the past 2 months: Single Family Home Expected Discharge Date: 09/11/23               DME Arranged: [place], 3-N-1 DME Agency: [facility] Date DME Agency Contacted: 09/09/23 Time DME Agency Contacted: [phone]               Social Determinants of Health (SDOH) Interventions SDOH Screenings   Food Insecurity: No Food Insecurity (09/08/2023)  Recent Concern: Food Insecurity - Food Insecurity Present (08/15/2023)  Housing: Low Risk  (09/08/2023)  Transportation Needs: No Transportation Needs (09/08/2023)  Utilities: Not At Risk (09/08/2023)  Recent Concern: Utilities - At Risk (08/15/2023)  Social Connections: Socially Isolated (09/08/2023)  Tobacco Use: Medium Risk (09/06/2023)    Readmission Risk Interventions    04/17/2023   11:09 AM  Readmission Risk Prevention Plan  Transportation Screening Complete  PCP or Specialist Appt within 5-7 Days Complete  Home Care Screening Complete  Medication Review (RN CM) Complete

## 2023-09-09 NOTE — Plan of Care (Signed)
  Problem: Coping: Goal: Ability to adjust to condition or change in health will improve Outcome: Progressing   Problem: Education: Goal: Knowledge of General Education information will improve Description: Including pain rating scale, medication(s)/side effects and non-pharmacologic comfort measures Outcome: Progressing   Problem: Clinical Measurements: Goal: Ability to maintain clinical measurements within normal limits will improve Outcome: Progressing Goal: Will remain free from infection Outcome: Progressing Goal: Diagnostic test results will improve Outcome: Progressing Goal: Respiratory complications will improve Outcome: Progressing Goal: Cardiovascular complication will be avoided Outcome: Progressing

## 2023-09-09 NOTE — Plan of Care (Addendum)
 Hospital at Home Progress Note    Blake Mcknight   FMW:[id number]  DOB: 1955/07/11  DOA: 09/06/2023     3 Date of Service: 09/09/2023     Subjective:  68 y.o. male with medical history significant of COPD and chronic respiratory failure on 2L home O2, hx lung CA s/p SBRT, HTN, HLD, schizophrenia, and DM type II presented with worsening breathing difficulties.  He was recently hospitalized from 5/25 -5/30 after coming in for COPD exacerbation with SIRS treated with steroids breathing treatments and empiric antibiotics with improvement in symptoms prior to discharge.  Patient was hospitalized for further management.   Hospital Problems Assessment and Plan:  COPD with acute exacerbation/acute on chronic respiratory failure with hypoxia Uses oxygen  at home at 2 L/min currently at baseline though w/ minimal to mild increased WOB  Came in with shortness of breath.  Was noted to be wheezing.  Was placed on BiPAP by the emergency department.  He was given Solu-Medrol . Subsequently taken off of BiPAP.  Improvement was noted.   Stopped rocephin  and solumedrol started azithromycin  and prednisone  6/24. Continue Nebulizer, Brovana  and budesonide .   Lung mass/history of lung cancer Patient has a right lung mass.  Seen by pulmonology.  Not thought to be a good candidate for EBUS and even treatment going forward.  Sister is aware of this. Palliative care to follow at home. Patients sister to talk to palliative/hospice    Soft tissue mass within duodenal bulb This was noted on CT scan of the abdomen pelvis which was done earlier on June 19.  Patient denies any abdominal pain currently.  Sister mentions that he does complain of abdominal pain at times.  He has tenuous respiratory status so endoscopy will be challenging-suspect he may not be a candidate.  He already has a lung mass which is thought to be cancerous.  Right now the duodenal lesion does not seem to be bothering him.  He is able to  tolerate his diet.   Consider outpatient follow up with GI  as appropriate.    Essential hypertension Stable on amlodipine  and carvedilol .   Diabetes mellitus type 2, controlled HbA1c 5.9 in May.  Monitor CBGs.   History of schizophrenia Continue risperidone  and benztropine .   Normocytic anemia Stable.   Hyperlipidemia Continue Lipitor   BPH Continue Flomax .     DVT Prophylaxis: none Code Status: DNR Family Communication:sister at bedside Disposition Plan:  hospital at home.    Objective  Patient seen sitting up in chair  Sister at bedside No nausea vomiting  Feeling better than yesterday Takes ultram  for chronic pain  Vital signs were reviewed and unremarkable. Physical Exam Constitutional:      Comments: Underweight    HENT:     Head: Normocephalic and atraumatic.     Nose: Nose normal.   Eyes:     Pupils: Pupils are equal, round, and reactive to light.    Cardiovascular:     Rate and Rhythm: Tachycardia present.  Pulmonary:     Effort: Pulmonary effort is normal.     Breath sounds: Wheezing present.   Musculoskeletal:        General: Normal range of motion.   Skin:    General: Skin is warm.   Neurological:     General: No focal deficit present.   Psychiatric:        Mood and Affect: Mood normal.     Labs / Other Information There are no new results to review at  this time.  DG Chest Port 1 View EXAM: 1 VIEW XRAY OF THE CHEST 09/06/2023 07:10:00 AM  COMPARISON: 2-view chest x-ray 09/03/2023 and CT angio of chest 09/04/2023.  CLINICAL HISTORY: SOB  FINDINGS:  LUNGS AND PLEURA: Severe emphysematous changes are again noted. Mass-like consolidation in the posterior right lower lobe is similar to the prior studies. Right greater than left pleural effusions are stable.  HEART AND MEDIASTINUM: No acute abnormality of the cardiac and mediastinal silhouettes.  BONES AND SOFT TISSUES: No acute osseous abnormality.  IMPRESSION: 1.  Mass-like consolidation in the posterior right lower lobe, similar to prior studies. 2. Right greater than left pleural effusions, stable. 3. Severe emphysematous changes.  Electronically signed by: Lonni Necessary MD 09/06/2023 07:23 AM EDT RP Workstation: [id number]   Lab Results  Component Value Date   WBC 6.9 09/07/2023   HGB 11.9 (L) 09/07/2023   HCT 36.3 (L) 09/07/2023   MCV 83.6 09/07/2023   PLT 337 09/07/2023   Last metabolic panel Lab Results  Component Value Date   GLUCOSE 147 (H) 09/07/2023   NA 134 (L) 09/07/2023   K 4.0 09/07/2023   CL 101 09/07/2023   CO2 23 09/07/2023   BUN <5 (L) 09/07/2023   CREATININE 0.56 (L) 09/07/2023   GFRNONAA >60 09/07/2023   CALCIUM  9.0 09/07/2023   PHOS 2.8 05/29/2016   PROT 7.3 09/03/2023   ALBUMIN 3.0 (L) 09/03/2023   BILITOT 0.2 09/03/2023   ALKPHOS 71 09/03/2023   AST 16 09/03/2023   ALT 16 09/03/2023   ANIONGAP 10 09/07/2023     Time spent: 60 minutes.  [facility] 09/09/2023, 10:00 AM

## 2023-09-09 NOTE — Progress Notes (Signed)
 Patient reports mid lower back pain, which is chronic, medicated as ordered for pain.  Will reassess for effectiveness and adverse reactions.

## 2023-09-09 NOTE — TOC Initial Note (Signed)
 Transition of Care [facility]) - Initial/Assessment Note    Patient Details  Name: Blake Mcknight MRN: [id number] Date of Birth: [birth year]  Transition of [facility]) CM/SW Contact:    Debarah Saunas, RN Phone Number: 09/09/2023, 9:12 AM  Clinical Narrative:                 68 y.o. male presented 6/22 with worsening breathing difficulties. He was recently hospitalized from 5/25 -5/30 after coming in for COPD exacerbation Patient was was transitioned to H@H  on 5/28 and dc to Home on 5/30. Lives with sister at home, she typically assists with all ADLs, is on 2L oxygen  prior to admission.  Expected Discharge Plan: Home/Self Care Barriers to Discharge: Continued Medical Work up   Patient Goals and CMS Choice Patient states their goals for this hospitalization and ongoing recovery are:: remain home with sister CMS Medicare.gov Compare Post Acute Care list provided to:: Patient        Expected Discharge Plan and Services In-house Referral: Hospice / Palliative Care [facility] Discharge Planning Services: CM Consult   Living arrangements for the past 2 months: Single Family Home Expected Discharge Date: 09/11/23               DME Arranged: [place], 3-N-1 DME Agency: [facility] Date DME Agency Contacted: 09/09/23 Time DME Agency Contacted: [phone]    RNCM called PCP Scarlett [phone]) office follow-up appointment.  Office will resume work hours Thursday @ 093, RNCM will call back.          Prior Living Arrangements/Services Living arrangements for the past 2 months: Single Family Home Lives with:: Relatives (sister, [name]) Patient language and need for interpreter reviewed:: Yes Do you feel safe going back to the place where you live?: Yes      Need for Family Participation in Patient Care: Yes (Comment) Care giver support system in place?: Yes (comment) Current home services: Other (comment) (home palliative) Criminal Activity/Legal Involvement Pertinent to Current  Situation/Hospitalization: No - Comment as needed  Activities of Daily Living   ADL Screening (condition at time of admission) Independently performs ADLs?: No Does the patient have a NEW difficulty with bathing/dressing/toileting/self-feeding that is expected to last >3 days?: No Does the patient have a NEW difficulty with getting in/out of bed, walking, or climbing stairs that is expected to last >3 days?: No Does the patient have a NEW difficulty with communication that is expected to last >3 days?: No Is the patient deaf or have difficulty hearing?: No Does the patient have difficulty seeing, even when wearing glasses/contacts?: No Does the patient have difficulty concentrating, remembering, or making decisions?: Yes  Permission Sought/Granted Permission sought to share information with : Case Manager, PCP, Family Supports Permission granted to share information with : Yes, Verbal Permission Granted     Permission granted to share info w AGENCY: home referals  Permission granted to share info w Relationship: sister, [name]     Emotional Assessment Appearance:: Appears stated age Attitude/Demeanor/Rapport: Engaged Affect (typically observed): Accepting, Hopeful Orientation: : Oriented to Self, Oriented to Place, Oriented to  Time, Oriented to Situation Alcohol / Substance Use: Not Applicable Psych Involvement: No (comment)  Admission diagnosis:  COPD exacerbation (HCC) [J44.1] Acute respiratory failure with hypoxia (HCC) [J96.01] Acute on chronic respiratory failure with hypoxia (HCC) [J96.21] Patient Active Problem List   Diagnosis Date Noted   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   Acute on chronic respiratory failure with hypoxia (HCC) 09/06/2023   History of lung cancer 09/06/2023  Controlled type 2 diabetes mellitus without complication, without long-term current use of insulin  (HCC) 09/06/2023   Normocytic anemia 09/06/2023   BPH (benign prostatic hyperplasia) 09/06/2023   Low  back pain 08/14/2023   CAP (community acquired pneumonia) 04/16/2023   Right upper quadrant abdominal pain 04/16/2023   Abnormal CT of the chest 03/05/2022   Malignant neoplasm of lower lobe of right lung (HCC) 07/17/2021   COPD with acute exacerbation (HCC) 09/02/2020   Hypoxia    COPD exacerbation (HCC) 06/05/2019   Do not resuscitate 02/18/2019   Acute respiratory failure with hypoxia (HCC) 06/16/2016   Chronic hypoxemic respiratory failure (HCC) 05/25/2016   Hyponatremia 09/22/2015   Bullous emphysema (HCC) 07/26/2013   Schizophrenia (HCC) 06/28/2013   Tobacco use disorder 06/06/2013   HTN (hypertension) 06/03/2013   HLD (hyperlipidemia) 06/03/2013   COPD (chronic obstructive pulmonary disease) (HCC) 06/03/2013   Calculus of bile duct 06/03/2013   PCP:  [name], MD Pharmacy:   [facility] [phone] - [place], [place] - [address] AT [facility] [address] [facility] [address] [place] [address] Phone: [phone] Fax: [phone]  [facility] [address] [place] [address] Phone: [phone] Fax: [phone]     Social Drivers of Health (SDOH) Social History: SDOH Screenings   Food Insecurity: No Food Insecurity (09/08/2023)  Recent Concern: Food Insecurity - Food Insecurity Present (08/15/2023)  Housing: Low Risk  (09/08/2023)  Transportation Needs: No Transportation Needs (09/08/2023)  Utilities: Not At Risk (09/08/2023)  Recent Concern: Utilities - At Risk (08/15/2023)  Social Connections: Socially Isolated (09/08/2023)  Tobacco Use: Medium Risk (09/06/2023)   SDOH Interventions:     Readmission Risk Interventions    04/17/2023   11:09 AM  Readmission Risk Prevention Plan  Transportation Screening Complete  PCP or Specialist Appt within 5-7 Days Complete  Home Care Screening Complete  Medication Review (RN CM) Complete

## 2023-09-09 NOTE — Plan of Care (Signed)

## 2023-09-09 NOTE — Plan of Care (Signed)
  Problem: Education: Goal: Knowledge of General Education information will improve Description: Including pain rating scale, medication(s)/side effects and non-pharmacologic comfort measures Outcome: Progressing   Problem: Health Behavior/Discharge Planning: Goal: Ability to manage health-related needs will improve Outcome: Progressing   Problem: Clinical Measurements: Goal: Ability to maintain clinical measurements within normal limits will improve Outcome: Progressing Goal: Respiratory complications will improve Outcome: Progressing   Problem: Activity: Goal: Risk for activity intolerance will decrease Outcome: Progressing   Problem: Nutrition: Goal: Adequate nutrition will be maintained Outcome: Progressing   Problem: Coping: Goal: Level of anxiety will decrease Outcome: Progressing   Problem: Elimination: Goal: Will not experience complications related to bowel motility Outcome: Progressing   Problem: Pain Managment: Goal: General experience of comfort will improve and/or be controlled Outcome: Progressing   Problem: Safety: Goal: Ability to remain free from injury will improve Outcome: Progressing   Problem: Skin Integrity: Goal: Risk for impaired skin integrity will decrease Outcome: Progressing   Problem: Respiratory: Goal: Ability to maintain a clear airway will improve Outcome: Progressing

## 2023-09-09 NOTE — Progress Notes (Signed)
 Patient vitals stable via current health throughout the night. No alarms noted. No calls to RN from patient or family.

## 2023-09-10 ENCOUNTER — Telehealth: Payer: Self-pay

## 2023-09-10 ENCOUNTER — Telehealth (HOSPITAL_COMMUNITY): Payer: Self-pay | Admitting: Pharmacy Technician

## 2023-09-10 DIAGNOSIS — K2100 Gastro-esophageal reflux disease with esophagitis, without bleeding: Secondary | ICD-10-CM | POA: Diagnosis not present

## 2023-09-10 DIAGNOSIS — F319 Bipolar disorder, unspecified: Secondary | ICD-10-CM | POA: Diagnosis not present

## 2023-09-10 DIAGNOSIS — J441 Chronic obstructive pulmonary disease with (acute) exacerbation: Secondary | ICD-10-CM | POA: Diagnosis not present

## 2023-09-10 DIAGNOSIS — M1990 Unspecified osteoarthritis, unspecified site: Secondary | ICD-10-CM | POA: Diagnosis not present

## 2023-09-10 DIAGNOSIS — I119 Hypertensive heart disease without heart failure: Secondary | ICD-10-CM | POA: Diagnosis not present

## 2023-09-10 DIAGNOSIS — N401 Enlarged prostate with lower urinary tract symptoms: Secondary | ICD-10-CM | POA: Diagnosis not present

## 2023-09-10 DIAGNOSIS — Z79899 Other long term (current) drug therapy: Secondary | ICD-10-CM | POA: Diagnosis not present

## 2023-09-10 DIAGNOSIS — M2550 Pain in unspecified joint: Secondary | ICD-10-CM | POA: Diagnosis not present

## 2023-09-10 DIAGNOSIS — E7800 Pure hypercholesterolemia, unspecified: Secondary | ICD-10-CM | POA: Diagnosis not present

## 2023-09-10 DIAGNOSIS — E559 Vitamin D deficiency, unspecified: Secondary | ICD-10-CM | POA: Diagnosis not present

## 2023-09-10 DIAGNOSIS — E1165 Type 2 diabetes mellitus with hyperglycemia: Secondary | ICD-10-CM | POA: Diagnosis not present

## 2023-09-10 DIAGNOSIS — K5900 Constipation, unspecified: Secondary | ICD-10-CM | POA: Diagnosis not present

## 2023-09-10 MED ORDER — POLYETHYLENE GLYCOL 3350 17 G PO PACK
17.0000 g | PACK | Freq: Every day | ORAL | 0 refills | Status: AC
Start: 2023-09-10 — End: ?

## 2023-09-10 MED ORDER — ONDANSETRON HCL 4 MG PO TABS: 4.0000 mg | ORAL_TABLET | Freq: Four times a day (QID) | ORAL | 0 refills | Status: DC | PRN

## 2023-09-10 MED ORDER — METHOCARBAMOL 750 MG PO TABS: 750.0000 mg | ORAL_TABLET | Freq: Three times a day (TID) | ORAL | 0 refills | Status: DC | PRN

## 2023-09-10 MED ORDER — GUAIFENESIN ER 600 MG PO TB12: 600.0000 mg | ORAL_TABLET | Freq: Two times a day (BID) | ORAL | 0 refills | Status: AC

## 2023-09-10 MED ORDER — POLYETHYLENE GLYCOL 3350 17 G PO PACK
17.0000 g | PACK | Freq: Every day | ORAL | Status: DC
  Filled 2023-09-10 (×2): qty 1

## 2023-09-10 MED ORDER — CARVEDILOL 3.125 MG PO TABS
6.2500 mg | ORAL_TABLET | Freq: Two times a day (BID) | ORAL | Status: AC
Start: 2023-09-10 — End: ?

## 2023-09-10 MED ORDER — LIDOCAINE 5 % EX PTCH: 1.0000 | MEDICATED_PATCH | CUTANEOUS | 0 refills | Status: AC

## 2023-09-10 MED ORDER — ARFORMOTEROL TARTRATE 15 MCG/2ML IN NEBU: 15.0000 ug | INHALATION_SOLUTION | Freq: Two times a day (BID) | RESPIRATORY_TRACT | 4 refills | Status: DC

## 2023-09-10 MED ORDER — PREDNISONE 20 MG PO TABS: ORAL_TABLET | ORAL | 0 refills | Status: AC

## 2023-09-10 NOTE — Progress Notes (Signed)
 Current health hypoxia alert. Patient and caregiver called, caregiver confirmed patient removed Oxygen  during sleep. Denies any emergent needs or patient distress. 2LNC replaced, Provider notified.

## 2023-09-10 NOTE — Plan of Care (Signed)

## 2023-09-10 NOTE — Discharge Summary (Signed)
 Physician Discharge Summary  Blake Mcknight FMW:[id number] DOB: 12-05-1955 DOA: 09/06/2023  PCP: [name], MD  Admit date: 09/06/2023 Discharge date: 09/10/2023  Admitted From: home Disposition: home Recommendations for Outpatient Follow-up:  Follow up with PCP in 1-2 weeks Please obtain BMP/CBC in one week Please follow up with dr byrum or NP or PA  Home Health:YES Equipment/Devices:CANE AND 3 IN 1  Discharge Condition:Palliative CODE STATUS:dnr Diet recommendation: regular  Brief/Interim Summary:  68 y.o. male with medical history significant of COPD and chronic respiratory failure on 2L home O2, hx lung CA s/p SBRT, HTN, HLD, schizophrenia, and DM type II presented with worsening breathing difficulties.  He was recently hospitalized from 5/25 -5/30 after coming in for COPD exacerbation with SIRS treated with steroids breathing treatments and empiric antibiotics with improvement in symptoms prior to discharge.  Patient was hospitalized for further management.   Discharge Diagnoses:  Principal Problem:   Acute on chronic respiratory failure with hypoxia (HCC) Active Problems:   COPD with acute exacerbation (HCC)   HTN (hypertension)   History of lung cancer   Controlled type 2 diabetes mellitus without complication, without long-term current use of insulin  (HCC)   Schizophrenia (HCC)   Normocytic anemia   Hyponatremia   HLD (hyperlipidemia)   BPH (benign prostatic hyperplasia)   Acute respiratory failure with hypoxia (HCC)   COPD exacerbation (HCC)   Duodenal mass  COPD with acute exacerbation/acute on chronic respiratory failure with hypoxia Uses oxygen  at home at 2 L/min currently at baseline though w/ minimal to mild increased WOB  Came in with shortness of breath.  Was noted to be wheezing.  Was placed on BiPAP by the emergency department.  He was given Solu-Medrol . Subsequently taken off of BiPAP.  Improvement was noted.   Stopped rocephin  and solumedrol  started azithromycin  and prednisone  6/24. Continue Nebulizer, Brovana  and budesonide . Follow up with PCCM   Lung mass/history of lung cancer Patient has a right lung mass.  Seen by pulmonology.  Not thought to be a good candidate for EBUS and even treatment going forward.  Sister is aware of this. Palliative care to follow at home. Patients sister to talk to palliative/hospice    Soft tissue mass within duodenal bulb This was noted on CT scan of the abdomen pelvis which was done earlier on June 19.  Patient denies any abdominal pain currently.  Sister mentions that he does complain of abdominal pain at times.  He has tenuous respiratory status so endoscopy will be challenging-suspect he may not be a candidate.  He already has a lung mass which is thought to be cancerous.  Right now the duodenal lesion does not seem to be bothering him.  He is able to tolerate his diet.   Consider outpatient follow up with GI  as appropriate.    Essential hypertension Stable on amlodipine  and carvedilol .   Diabetes mellitus type 2, controlled HbA1c 5.9 in May.  Monitor CBGs.   History of schizophrenia Continue risperidone  and benztropine .   Normocytic anemia Stable.   Hyperlipidemia Continue Lipitor   BPH Continue Flomax .   Estimated body mass index is 20.64 kg/m as calculated from the following:   Height as of this encounter: 5' 6 (1.676 m).   Weight as of this encounter: 58 kg.  Discharge Instructions  Discharge Instructions     Diet - low sodium heart healthy   Complete by: As directed    Increase activity slowly   Complete by: As directed  Allergies as of 09/10/2023   No Known Allergies      Medication List     STOP taking these medications    amLODipine  5 MG tablet Commonly known as: NORVASC    melatonin 5 MG Tabs   potassium chloride  SA 20 MEQ tablet Commonly known as: KLOR-CON  M       TAKE these medications    acetaminophen  500 MG tablet Commonly known  as: TYLENOL  Take 2 tablets (1,000 mg total) by mouth every 8 (eight) hours.   albuterol  108 (90 Base) MCG/ACT inhaler Commonly known as: VENTOLIN  HFA Inhale 2 puffs into the lungs every 4 (four) hours as needed for wheezing or shortness of breath.   albuterol  (2.5 MG/3ML) 0.083% nebulizer solution Commonly known as: PROVENTIL  Take 3 mLs (2.5 mg total) by nebulization every 4 (four) hours as needed for wheezing or shortness of breath.   arformoterol  15 MCG/2ML Nebu Commonly known as: BROVANA  Take 2 mLs (15 mcg total) by nebulization 2 (two) times daily.   atorvastatin  10 MG tablet Commonly known as: LIPITOR Take 1 tablet (10 mg total) by mouth at bedtime.   benztropine  2 MG tablet Commonly known as: COGENTIN  Take 1 tablet (2 mg total) by mouth 2 (two) times daily.   Breztri Aerosphere 160-9-4.8 MCG/ACT Aero inhaler Generic drug: budesonide -glycopyrrolate -formoterol  Inhale 1-2 puffs into the lungs 2 (two) times daily.   budesonide  0.25 MG/2ML nebulizer solution Commonly known as: PULMICORT  Take 2 mLs (0.25 mg total) by nebulization 2 (two) times daily.   carvedilol  3.125 MG tablet Commonly known as: COREG  Take 2 tablets (6.25 mg total) by mouth 2 (two) times daily with a meal. What changed: how much to take   cetirizine 10 MG tablet Commonly known as: ZYRTEC Take 10 mg by mouth daily as needed for allergies or rhinitis.   cholecalciferol 25 MCG (1000 UNIT) tablet Commonly known as: VITAMIN D3 Take 5,000 Units by mouth every morning.   famotidine  40 MG tablet Commonly known as: PEPCID  Take 40 mg by mouth 2 (two) times daily.   feeding supplement (GLUCERNA SHAKE) Liqd Take 237 mLs by mouth 3 (three) times daily between meals.   guaiFENesin  600 MG 12 hr tablet Commonly known as: MUCINEX  Take 1 tablet (600 mg total) by mouth 2 (two) times daily for 10 days.   ibuprofen  400 MG tablet Commonly known as: ADVIL  Take 1 tablet (400 mg total) by mouth every 6 (six) hours  as needed (moderate to severe pain).   ipratropium-albuterol  0.5-2.5 (3) MG/3ML Soln Commonly known as: DUONEB Take 3 mLs by nebulization every 4 (four) hours.   Januvia 100 MG tablet Generic drug: sitaGLIPtin Take 100 mg by mouth daily.   lidocaine  5 % Commonly known as: LIDODERM  Place 1 patch onto the skin daily. Remove & Discard patch within 12 hours or as directed by MD   methocarbamol  750 MG tablet Commonly known as: ROBAXIN  Take 1 tablet (750 mg total) by mouth every 8 (eight) hours as needed for muscle spasms. What changed: when to take this   mirtazapine  30 MG tablet Commonly known as: REMERON  Take 1 tablet (30 mg total) by mouth at bedtime as needed (insomnia). What changed: when to take this   multivitamin Tabs tablet Take 1 tablet by mouth daily.   ondansetron  4 MG tablet Commonly known as: ZOFRAN  Take 1 tablet (4 mg total) by mouth every 6 (six) hours as needed for nausea.   OXYGEN  Inhale 2 L/min into the lungs continuous.   polyethylene glycol  17 g packet Commonly known as: MIRALAX  / GLYCOLAX  Take 17 g by mouth daily as needed for mild constipation. What changed: Another medication with the same name was added. Make sure you understand how and when to take each.   polyethylene glycol 17 g packet Commonly known as: MIRALAX  / GLYCOLAX  Take 17 g by mouth daily. What changed: You were already taking a medication with the same name, and this prescription was added. Make sure you understand how and when to take each.   predniSONE  20 MG tablet Commonly known as: DELTASONE  Take 40 mg daily for 5 days Start taking on: September 11, 2023 What changed:  medication strength how much to take how to take this when to take this additional instructions   risperiDONE  3 MG tablet Commonly known as: RISPERDAL  Take 1 tablet (3 mg total) by mouth 2 (two) times daily.   sodium chloride  0.65 % Soln nasal spray Commonly known as: OCEAN Place 1 spray into both nostrils as  needed for congestion.   tamsulosin  0.4 MG Caps capsule Commonly known as: FLOMAX  Take 0.4 mg by mouth in the morning and at bedtime.   traMADol  50 MG tablet Commonly known as: ULTRAM  Take 1 tablet (50 mg total) by mouth every 6 (six) hours as needed for up to 2 days (pain not controlled with Tylenol , ibuprofen  and Robaxin ).               Durable Medical Equipment  (From admission, onward)           Start     Ordered   09/10/23 1345  DME Cane  Once        09/10/23 1344   09/10/23 1345  DME 3-in-1  Once        09/10/23 1344   09/09/23 0853  For home use only DME 3 n 1  Once        09/09/23 9147   09/09/23 0848  For home use only DME Cane  Once        09/09/23 0847            Follow-up Information     [facility] Follow up.   Why: Home Health; they will call you before the first visit Contact information: [address] [place] [address] [phone]         Shelah Lamar RAMAN, MD Follow up on 10/21/2023.   Specialty: Pulmonary Disease Why: Time 2:00, Please arrive 15 min early to complete paperwork Contact information: [address] [place] [address] [phone]         [name], MD Follow up on 09/22/2023.   Specialty: Pulmonary Disease Why: Time 11:00, Please arrive 15 min early to complete paperwork Contact information: [address] [place] [address] [phone]         Hope Almarie ORN, NP Follow up.   Specialty: Pulmonary Disease Why: 7/22 at 1 30 pm arrive 15 min early Contact information: [address] [place] [address] [phone]                No Known Allergies  Consultations: NONE   Procedures/Studies: DG Chest Port 1 View Result Date: 09/06/2023 EXAM: 1 VIEW XRAY OF THE CHEST 09/06/2023 07:10:00 AM COMPARISON: 2-view chest x-ray 09/03/2023 and CT angio of chest 09/04/2023. CLINICAL HISTORY: SOB FINDINGS: LUNGS AND PLEURA: Severe  emphysematous changes are again noted. Mass-like consolidation in the posterior right lower lobe is  similar to the prior studies. Right greater than left pleural effusions are stable. HEART AND MEDIASTINUM: No acute abnormality of the cardiac and mediastinal silhouettes. BONES AND SOFT TISSUES: No acute osseous abnormality. IMPRESSION: 1. Mass-like consolidation in the posterior right lower lobe, similar to prior studies. 2. Right greater than left pleural effusions, stable. 3. Severe emphysematous changes. Electronically signed by: Lonni Necessary MD 09/06/2023 07:23 AM EDT RP Workstation: [id number]   CT Angio Chest PE W and/or Wo Contrast Result Date: 09/04/2023 CLINICAL DATA:  68 year old male with abdomen pain, known posterior right lower lung mass partially visible on CT Abdomen and Pelvis yesterday. EXAM: CT ANGIOGRAPHY CHEST WITH CONTRAST TECHNIQUE: Multidetector CT imaging of the chest was performed using the standard protocol during bolus administration of intravenous contrast. Multiplanar CT image reconstructions and MIPs were obtained to evaluate the vascular anatomy. RADIATION DOSE REDUCTION: This exam was performed according to the departmental dose-optimization program which includes automated exposure control, adjustment of the mA and/or kV according to patient size and/or use of iterative reconstruction technique. CONTRAST:  75mL OMNIPAQUE  IOHEXOL  350 MG/ML SOLN COMPARISON:  CT Abdomen and Pelvis yesterday. CTA chest 08/09/2023 and earlier. FINDINGS: Cardiovascular: Excellent contrast bolus timing in the pulmonary arterial tree. Mild mixing artifact in the main pulmonary arteries. No pulmonary artery thrombus or filling defect identified. Calcified aortic atherosclerosis. Calcified coronary artery atherosclerosis. Heart size remains normal. No pericardial effusion. Mediastinum/Nodes: Mildly enlarged subcarinal soft tissue mass since 08/09/2023 (up to 2.9 cm short axis now, 2.6 cm previously)  appears to be lymphadenopathy or ex nodal disease eccentric to the right. Other right hilar lymph node tissue up to 13 mm short axis is stable on series 4, image 72. No contralateral left hilar lymphadenopathy. No superior mediastinal lymphadenopathy. Lungs/Pleura: Severe emphysema. Extensive bullous disease and architectural distortion bilaterally. Confluent secretions along the right lateral wall of the trachea, retained secretions in the bilateral mainstem bronchi. Ongoing right lower lobe masslike peribronchial opacity, consolidative opacity. This measures up to 6.3 cm long axis, size and configuration appears stable since 08/09/2023. No significant pleural effusion. No new pulmonary abnormality identified. Upper Abdomen: Stable visible noncontrast upper abdominal viscera including cholecystectomy. No upper abdominal free air or free fluid. Musculoskeletal: Osteopenia. T7 compression fracture was present last month but has per progressed to vertebra plana now (series 8, image 92). No significant retropulsion. No destructive osseous lesion identified. Other vertebral height maintained. No acute osseous abnormality identified. Review of the MIP images confirms the above findings. IMPRESSION: 1. Negative for acute pulmonary embolus. 2. Masslike peribronchial consolidation in the right lower lobe not significantly changed since 08/09/2023 but mildly enlarged subcarinal lymphadenopathy and/or ex nodal mass since that time, now up to 2.9 cm short axis. Constellation highly suspicious for bronchogenic carcinoma with nodal metastases. Severe underlying Emphysema (ICD10-J43.9). Recommend referral to [facility]) if not already done. 3. Osteopenia. T7 compression fracture last month has progressed to vertebra plana. No obvious skeletal metastasis by CT. 4.  Aortic Atherosclerosis (ICD10-I70.0). Electronically Signed   By: VEAR Hurst M.D.   On: 09/04/2023 06:00   CT ABDOMEN PELVIS W  CONTRAST Result Date: 09/03/2023 CLINICAL DATA:  Abdominal cramping for 2 weeks. EXAM: CT ABDOMEN AND PELVIS WITH CONTRAST TECHNIQUE: Multidetector CT imaging of the abdomen and pelvis was performed using the standard protocol following bolus administration of intravenous contrast. RADIATION DOSE REDUCTION: This exam was performed according to the departmental dose-optimization program which includes automated exposure control, adjustment of the mA and/or kV according to  patient size and/or use of iterative reconstruction technique. CONTRAST:  75mL OMNIPAQUE  IOHEXOL  350 MG/ML SOLN COMPARISON:  Aug 10, 2023 FINDINGS: Lower chest: Extensive emphysematous lung disease is seen within the bilateral lung bases with a stable masslike area seen within the lower right lung. A small right pleural effusion is also present. Hepatobiliary: There is diffuse fatty infiltration of the liver. A 2.3 cm x 1.8 cm area of low attenuation and thin peripheral rim of vascularity is seen within the right lobe of the liver (Segment VI). Status post cholecystectomy. No biliary dilatation. Pancreas: Unremarkable. No pancreatic ductal dilatation or surrounding inflammatory changes. Spleen: Normal in size without focal abnormality. Adrenals/Urinary Tract: Adrenal glands are unremarkable. Kidneys are normal in size, without renal calculi or hydronephrosis. Several bilateral simple renal cysts are seen. Bladder is unremarkable. Stomach/Bowel: Stomach is within normal limits. Appendix appears normal. A 1.8 cm well-defined, intraluminal, mildly hyperdense area (approximately 153.23 Hounsfield units) is seen along the wall of the duodenal bulb (axial CT image 22, CT series 2/coronal reformatted images 58 through 61, CT series 5). No evidence of bowel dilatation or inflammatory changes. Noninflamed diverticula are seen throughout the descending and sigmoid colon. Vascular/Lymphatic: Aortic atherosclerosis. No enlarged abdominal or pelvic lymph nodes.  Reproductive: The prostate gland is mildly enlarged. Other: No abdominal wall hernia or abnormality. No abdominopelvic ascites. Musculoskeletal: No acute or significant osseous findings. IMPRESSION: 1. Extensive emphysematous lung disease with a stable mass-like area within the lower right lung. 2. Small right pleural effusion. 3. Findings likely consistent with a hemangioma within the right lobe of the liver. Correlation with nonemergent hepatic ultrasound is recommended. 4. Findings which may represent a soft tissue mass within the duodenal bulb, as described above. GI consultation and subsequent endoscopies recommended to further exclude the presence of an underlying neoplastic process. 5. Colonic diverticulosis. 6. Bilateral simple renal cysts. No follow-up imaging is recommended. This recommendation follows ACR consensus guidelines: Management of the Incidental Renal Mass on CT: A White Paper of the ACR Incidental Findings Committee. J Am Coll Radiol [phone]. 7. Aortic atherosclerosis. Electronically Signed   By: Suzen Dials M.D.   On: 09/03/2023 20:02   DG Chest 2 View Result Date: 09/03/2023 CLINICAL DATA:  Cough, COPD EXAM: CHEST - 2 VIEW COMPARISON:  08/09/2023, CT 08/18/2023 FINDINGS: Severe emphysema. Stable consolidation within the posteromedial right lower lobe. Small right pleural effusion has developed. Left lung is clear. No pneumothorax. No pleural effusion on the left. Cardiac size within normal limits. No acute bone abnormality. IMPRESSION: 1. Interval development of a small right pleural effusion. 2. Stable right lower lobe consolidation. Electronically Signed   By: Dorethia Molt M.D.   On: 09/03/2023 19:42   CT Chest Wo Contrast Result Date: 08/25/2023 CLINICAL DATA:  Follow-up abnormal CT chest March 2025, suspected lung cancer. EXAM: CT CHEST WITHOUT CONTRAST TECHNIQUE: Multidetector CT imaging of the chest was performed following the standard protocol without IV contrast.  RADIATION DOSE REDUCTION: This exam was performed according to the departmental dose-optimization program which includes automated exposure control, adjustment of the mA and/or kV according to patient size and/or use of iterative reconstruction technique. COMPARISON:  Portable chest and CTA chest both 08/09/2023, and chest CTs without contrast 05/19/2023 and 03/31/2023. FINDINGS: Cardiovascular: The cardiac size is normal. There is a small chronic anterior pericardial effusion. There are left main and three-vessel coronary calcifications heaviest in the LAD. The pulmonary arteries and veins are normal in caliber. There is a normal aortic caliber and course with  moderate patchy calcific plaques, without evidence of aortic aneurysm. There are scattered calcifications in the great vessels. Mediastinum/Nodes: Stable 2.5 x 3.6 cm subcarinal nodal complex eccentric to the right. Stable right mid hilar lymph node, 1.2 cm in short axis on 2:70. Mucoid debris again noted in the posterior trachea. Both main bronchi are clear. Negative thyroid, axillary spaces. No other adenopathy is seen without contrast. Lungs/Pleura: Advanced paraseptal emphysematous disease predominating in the upper lobes but seen throughout, mild underlying centrilobular disease in the upper lobes. Right lower lobe posterior basal mass difficult to define without contrast. On 8:97 this measures 6 x 5.4 cm, previously 7 x 4.9 cm on the last 2 studies. Just above this, a stable partially calcified nodule measures 1.8 x 1.4 cm on 8:85. There is interval increase respiratory motion. There is diffuse bronchial thickening and chronic bronchial impactions in the lower lobes. Minimal right pleural effusion appears similar. There is a calcified granuloma posteriorly in the left lower lobe. There is a stable 1.2 cm noncalcified the new medial lingula on 8:91. This is probably nodular scarring given the location and appearance of it. No new or worsening lung  abnormality is seen. No left pleural fluid. No pneumothorax. Upper Abdomen: No acute abnormality. Status post cholecystectomy. Abdominal aortic atherosclerosis. Musculoskeletal: No regional bone metastasis is seen. Moderate bow tie compression fracture T7 again noted with increased central height loss now 70% was previously 50%, with 50% anterior and 30% posterior height loss which is unchanged. There is increased vacuum phenomenon in the anterior vertebral body. There is osteopenia without other significant skeletal findings. No thoracic bone metastasis is seen. No abnormality in the visualized chest wall. IMPRESSION: 1. 6 x 5.4 cm right lower lobe posterior basal mass difficult to define without contrast, but appears similar to the last 2 studies. On the last CT measured 7 x 4.9 cm. 2. Stable 1.8 x 1.4 cm partially calcified nodule just above the right lower lobe mass. 3. Stable 1.2 cm noncalcified nodule in the medial lingula, probably nodular scarring given the location and appearance of it. 4. Stable subcarinal and right hilar adenopathy. 5. Advanced paraseptal emphysema. 6. Aortic and coronary artery atherosclerosis. 7. Chronic small anterior pericardial effusion. 8. Osteopenia. 9. T7 bow-tie compression fracture with increased central height loss now 70%, previously 50%. Aortic Atherosclerosis (ICD10-I70.0) and Emphysema (ICD10-J43.9). Electronically Signed   By: Francis Quam M.D.   On: 08/25/2023 04:25   (Echo, Carotid, EGD, Colonoscopy, ERCP)    Subjective:  He feels he is back his normal Had some blood in stool today had constipation likely from straining Not on [facility] Discharge Exam: Vitals:   09/09/23 1704 09/09/23 2100  BP: 122/78   Pulse: (!) 101   Resp:    Temp:    SpO2:  99%   Vitals:   09/09/23 0851 09/09/23 1030 09/09/23 1704 09/09/23 2100  BP: 117/67 124/72 122/78   Pulse:  92 (!) 101   Resp:  18    Temp:  99.1 F (37.3 C)    TempSrc:  Oral    SpO2:  98%  99%  Weight:       Height:       EXAM BY alex carrico RN General: Pt is alert, awake, not in acute distress Cardiovascular: REG Respiratory: few wheezing, no rhonchi Abdominal: Soft, NT, ND, bowel sounds + Extremities: no edema, no cyanosis    The results of significant diagnostics from this hospitalization (including imaging, microbiology, ancillary and laboratory) are listed below for  reference.     Microbiology: No results found for this or any previous visit (from the past 240 hours).   Labs: BNP (last 3 results) Recent Labs    09/06/23 0643  BNP 28.2   Basic Metabolic Panel: Recent Labs  Lab 09/03/23 1737 09/06/23 0643 09/06/23 0703 09/07/23 0447  NA 133* 133* 132* 134*  K 4.0 4.0 4.0 4.0  CL 98 97*  --  101  CO2 28 22  --  23  GLUCOSE 89 153*  --  147*  BUN <5* <5*  --  <5*  CREATININE 0.69 0.64  --  0.56*  CALCIUM  9.2 9.1  --  9.0   Liver Function Tests: Recent Labs  Lab 09/03/23 1737  AST 16  ALT 16  ALKPHOS 71  BILITOT 0.2  PROT 7.3  ALBUMIN 3.0*   Recent Labs  Lab 09/03/23 1737  LIPASE 25   No results for input(s): AMMONIA in the last 168 hours. CBC: Recent Labs  Lab 09/03/23 1737 09/06/23 0643 09/06/23 0703 09/07/23 0447  WBC 6.5 7.2  --  6.9  NEUTROABS  --  4.3  --   --   HGB 12.5* 12.0* 12.2* 11.9*  HCT 39.0 36.5* 36.0* 36.3*  MCV 84.1 83.1  --  83.6  PLT 367 359  --  337   Cardiac Enzymes: No results for input(s): CKTOTAL, CKMB, CKMBINDEX, TROPONINI in the last 168 hours. BNP: Invalid input(s): POCBNP CBG: Recent Labs  Lab 09/07/23 1125 09/07/23 1626 09/07/23 2050 09/08/23 0602 09/08/23 1130  GLUCAP 180* 147* 117* 233* 160*   D-Dimer No results for input(s): DDIMER in the last 72 hours. Hgb A1c No results for input(s): HGBA1C in the last 72 hours. Lipid Profile No results for input(s): CHOL, HDL, LDLCALC, TRIG, CHOLHDL, LDLDIRECT in the last 72 hours. Thyroid function studies No results for input(s):  TSH, T4TOTAL, T3FREE, THYROIDAB in the last 72 hours.  Invalid input(s): FREET3 Anemia work up No results for input(s): VITAMINB12, FOLATE, FERRITIN, TIBC, IRON, RETICCTPCT in the last 72 hours. Urinalysis    Component Value Date/Time   COLORURINE YELLOW 04/16/2023 0616   APPEARANCEUR CLEAR 04/16/2023 0616   LABSPEC 1.016 04/16/2023 0616   PHURINE 6.0 04/16/2023 0616   GLUCOSEU 150 (A) 04/16/2023 0616   HGBUR NEGATIVE 04/16/2023 0616   BILIRUBINUR NEGATIVE 04/16/2023 0616   KETONESUR NEGATIVE 04/16/2023 0616   PROTEINUR NEGATIVE 04/16/2023 0616   UROBILINOGEN 0.2 01/29/2019 1634   NITRITE NEGATIVE 04/16/2023 0616   LEUKOCYTESUR NEGATIVE 04/16/2023 0616   Sepsis Labs Recent Labs  Lab 09/03/23 1737 09/06/23 0643 09/07/23 0447  WBC 6.5 7.2 6.9   Microbiology No results found for this or any previous visit (from the past 240 hours).   Time coordinating discharge:39 minutes  SIGNED:   Almarie KANDICE Hoots, MD  [facility] 09/10/2023, 3:43 PM

## 2023-09-10 NOTE — Telephone Encounter (Signed)
 Spoke with patient's case worker today regarding trying to get patient to come in sooner for a f/u with Dr.Byrum per Dr.Newton.Patient does have a office visit on 10/21/2023.A this time our office doesn't have any sooner office visit's .

## 2023-09-10 NOTE — Progress Notes (Signed)
 Current health data monitoring continued. No additional alerts or calls to RN. Patient remained stable after previous alert.

## 2023-09-10 NOTE — Progress Notes (Signed)
 Arrived at home to find pt resting in left lateral position, awake/alert/oriented and at baseline.  Moved to sitting position in bed without assistance or incident for assessment.  GCS 15, PEARL, Respirations even/unlabored with symmetrical chest wall expansion. Remains diminished to auscultation bilaterally with slight expiratory wheezes.  Denies SOB, pain or any other acute complaints. Abd soft/distended and gaseous with hyperactive bowel sounds.  Pt has an increase in flatulence  and burping.  Had a BM this morning and sister reported possible blood in stool, unsure if from straining. But was not copious in amount.  Dr. Will made aware on video call, no new orders obtained, will follow up with PCP. Skin warm/pink/dry and intact. Pulses, motor and sensation +x4 extremities.  Video visit conducted with virtual RN and Dr. Will.  Assessment was noted and plan for DC this afternoon.  Patient and family verbalize understanding of plan of care.

## 2023-09-10 NOTE — Telephone Encounter (Signed)
 Patient has been scheduled for a HFU and to get CT result's with Beth on 10/06/2023 at 1:30 pm   Nothing else further needed.

## 2023-09-10 NOTE — Progress Notes (Signed)
 AVS form given to patient's sister, [name]. Discharge instructions provided. PIV removed. Patient and family agreeable to discharge plan.

## 2023-09-10 NOTE — Telephone Encounter (Signed)
 Pharmacy Patient Advocate Encounter   Received notification from Fax that prior authorization for Lidocaine  5% patches is required/requested.   Insurance verification completed.   The patient is insured through [facility] .   Per test claim: PA required; PA submitted to above mentioned insurance via CoverMyMeds Key/confirmation #/EOC [id number] Status is pending

## 2023-09-10 NOTE — Telephone Encounter (Signed)
 Pharmacy Patient Advocate Encounter  Received notification from [facility] that Prior Authorization for Lidocaine  5% patches  has been APPROVED from 09/10/2023 to 09/09/2024   PA #/Case ID/Reference #: [id number]

## 2023-09-11 ENCOUNTER — Telehealth: Payer: Self-pay

## 2023-09-11 NOTE — Transitions of Care (Post Inpatient/ED Visit) (Signed)
 09/11/2023  Name: Blake Mcknight MRN: [id number] DOB: July 04, 1955  Today's TOC FU Call Status: Today's TOC FU Call Status:: Successful TOC FU Call Completed TOC FU Call Complete Date: 09/11/23 Patient's Name and Date of Birth confirmed.  Transition Care Management Follow-up Telephone Call Date of Discharge: 09/10/23 Discharge Facility: [facility]) Type of Discharge: Inpatient Admission Primary Inpatient Discharge Diagnosis:: COPD exacerbation How have you been since you were released from the hospital?: Better Any questions or concerns?: No  Items Reviewed: Did you receive and understand the discharge instructions provided?: Yes Medications obtained,verified, and reconciled?: Yes (Medications Reviewed) Any new allergies since your discharge?: No Dietary orders reviewed?: Yes Type of Diet Ordered:: Regular Do you have support at home?: Yes People in Home [RPT]: sibling(s) Name of Support/Comfort Primary Source: [name], sister  Medications Reviewed Today: Medications Reviewed Today     Reviewed by Moises Reusing, RN (Case Manager) on 09/11/23 at 1423  Med List Status: <None>   Medication Order Taking? Sig Documenting Provider Last Dose Status Informant  acetaminophen  (TYLENOL ) 500 MG tablet [id number] Yes Take 2 tablets (1,000 mg total) by mouth every 8 (eight) hours. Fausto Burnard LABOR, DO  Active Family Member  albuterol  (PROVENTIL ) (2.5 MG/3ML) 0.083% nebulizer solution [id number] Yes Take 3 mLs (2.5 mg total) by nebulization every 4 (four) hours as needed for wheezing or shortness of breath. Fausto Burnard LABOR, DO  Active Family Member  albuterol  (VENTOLIN  HFA) [phone] Base) MCG/ACT inhaler [id number] Yes Inhale 2 puffs into the lungs every 4 (four) hours as needed for wheezing or shortness of breath. Bernard Drivers, MD  Active Family Member  arformoterol  (BROVANA ) 15 MCG/2ML NEBU [id number] Yes Take 2 mLs (15 mcg total) by nebulization 2 (two) times daily. Will Almarie MATSU, MD  Active   atorvastatin  (LIPITOR) 10 MG tablet [id number] Yes Take 1 tablet (10 mg total) by mouth at bedtime. Fausto Burnard LABOR, DO  Active Family Member  benztropine  (COGENTIN ) 2 MG tablet [id number] Yes Take 1 tablet (2 mg total) by mouth 2 (two) times daily. Arfeen, Syed T, MD  Active Family Member  budesonide  (PULMICORT ) 0.25 MG/2ML nebulizer solution [id number] Yes Take 2 mLs (0.25 mg total) by nebulization 2 (two) times daily. Fausto Burnard LABOR, DO  Active Family Member  budesonide -glycopyrrolate -formoterol  (BREZTRI AEROSPHERE) 160-9-4.8 MCG/ACT AERO inhaler [id number] Yes Inhale 1-2 puffs into the lungs 2 (two) times daily. [provider]  Active Family Member  carvedilol  (COREG ) 3.125 MG tablet [id number] Yes Take 2 tablets (6.25 mg total) by mouth 2 (two) times daily with a meal. Will Almarie MATSU, MD  Active   cetirizine (ZYRTEC) 10 MG tablet [id number] Yes Take 10 mg by mouth daily as needed for allergies or rhinitis. [provider]  Active Family Member  cholecalciferol (VITAMIN D) 25 MCG (1000 UNIT) tablet [id number] Yes Take 5,000 Units by mouth every morning. [provider]  Active Family Member  famotidine  (PEPCID ) 40 MG tablet [id number] Yes Take 40 mg by mouth 2 (two) times daily. [provider]  Active Family Member  feeding supplement, GLUCERNA SHAKE, (GLUCERNA SHAKE) LIQD [id number] Yes Take 237 mLs by mouth 3 (three) times daily between meals. Fausto Burnard LABOR, DO  Active Family Member  guaiFENesin  (MUCINEX ) 600 MG 12 hr tablet [id number] Yes Take 1 tablet (600 mg total) by mouth 2 (two) times daily for 10 days. Will Almarie MATSU, MD  Active   ibuprofen  (ADVIL ) 400 MG tablet [id number] Yes Take 1 tablet (400 mg total) by  mouth every 6 (six) hours as needed (moderate to severe pain). Fausto Burnard LABOR, DO  Active Family Member  ipratropium-albuterol  (DUONEB) 0.5-2.5 (3) MG/3ML SOLN [id number] Yes Take 3 mLs by nebulization every 4 (four)  hours. Fausto Burnard LABOR, DO  Active Family Member  JANUVIA 100 MG tablet [id number] Yes Take 100 mg by mouth daily. [provider]  Active Family Member           Med Note (WHITE, DONETA GORMAN Repress Aug 09, 2023 11:35 PM)    lidocaine  (LIDODERM ) 5 % [id number] Yes Place 1 patch onto the skin daily. Remove & Discard patch within 12 hours or as directed by MD Will Almarie MATSU, MD  Active   methocarbamol  (ROBAXIN ) 750 MG tablet [id number] Yes Take 1 tablet (750 mg total) by mouth every 8 (eight) hours as needed for muscle spasms. Will Almarie MATSU, MD  Active   mirtazapine  (REMERON ) 30 MG tablet [id number] Yes Take 1 tablet (30 mg total) by mouth at bedtime as needed (insomnia).  Patient taking differently: Take 30 mg by mouth at bedtime.   Arfeen, Syed T, MD  Active Family Member  multivitamin (ONE-A-DAY MEN'S) TABS tablet [id number] Yes Take 1 tablet by mouth daily. [provider]  Active Family Member           Med Note (WHITE, DONETA GORMAN Schaumann Apr 16, 2023  3:18 AM) Taken occasionally   ondansetron  (ZOFRAN ) 4 MG tablet [id number] Yes Take 1 tablet (4 mg total) by mouth every 6 (six) hours as needed for nausea. Will Almarie MATSU, MD  Active   OXYGEN  [id number] Yes Inhale 2 L/min into the lungs continuous.  [provider]  Active Family Member  polyethylene glycol (MIRALAX  / GLYCOLAX ) 17 g packet [id number]  Take 17 g by mouth daily as needed for mild constipation.  Patient not taking: Reported on 09/11/2023   [provider]  Active Family Member  polyethylene glycol (MIRALAX  / GLYCOLAX ) 17 g packet [id number] Yes Take 17 g by mouth daily. Will Almarie MATSU, MD  Active   predniSONE  (DELTASONE ) 20 MG tablet [id number] Yes Take 40 mg daily for 5 days Will Almarie MATSU, MD  Active   risperiDONE  (RISPERDAL ) 3 MG tablet [id number] Yes Take 1 tablet (3 mg total) by mouth 2 (two) times daily. Arfeen, Syed T, MD  Active Family Member  sodium chloride  (OCEAN) 0.65 %  SOLN nasal spray [id number] Yes Place 1 spray into both nostrils as needed for congestion. [provider]  Active Family Member  tamsulosin  (FLOMAX ) 0.4 MG CAPS capsule [id number] Yes Take 0.4 mg by mouth in the morning and at bedtime. [provider]  Active Family Member            Home Care and Equipment/Supplies: Were Home Health Services Ordered?: Yes Name of Home Health Agency:: [facility] Has Agency set up a time to come to your home?: No EMR reviewed for Home Health Orders: Orders present/patient has not received call (refer to CM for follow-up) Any new equipment or medical supplies ordered?: No  Functional Questionnaire: Do you need assistance with bathing/showering or dressing?: Yes (Family members assist as needed) Do you need assistance with meal preparation?: Yes (Family members assist as needed) Do you need assistance with eating?: No Do you have difficulty maintaining continence: No Do you need assistance with getting out of bed/getting out of a chair/moving?: No Do you have difficulty managing or taking your medications?: Yes (Family members assist as  needed)  Follow up appointments reviewed: PCP Follow-up appointment confirmed?: No (The sister declines making an appointment. States they were just there two weeks ago) MD Provider Line [phone] Given: No Specialist Hospital Follow-up appointment confirmed?: Yes Date of Specialist follow-up appointment?: 10/06/23 Follow-Up Specialty Provider:: Dr. Shelah Do you need transportation to your follow-up appointment?: No Do you understand care options if your condition(s) worsen?: Yes-patient verbalized understanding  SDOH Interventions Today    Flowsheet Row Most Recent Value  SDOH Interventions   Food Insecurity Interventions Intervention Not Indicated  Housing Interventions Intervention Not Indicated  Transportation Interventions Intervention Not Indicated  Utilities Interventions  Intervention Not Indicated    Goals       (per sister) Remain out of hospital (pt-stated)      Care Coordination Interventions: Advised patient to track and manage COPD triggers Advised patient to self assesses COPD action plan zone and make appointment with provider if in the yellow zone for 48 hours without improvement Provided education about and advised patient to utilize infection prevention strategies to reduce risk of respiratory infection   8/17 - Has remained stable, no hospitalizations since June.  Per sister, does not have any current shortness of breath      VBCI Transitions of Care (TOC) Care Plan      Problems: (reviewed 09/11/23) Recent Hospitalization for treatment of COPD and Low Back Pain  Goal: (reviewed 09/11/23) Over the next 30 days, the patient will not experience hospital readmission  Interventions:   COPD Interventions:  (reviewed 09/11/23) Advised patient to engage in light exercise as tolerated 3-5 days a week to aid in the the management of COPD Advised patient to track and manage COPD triggers Assessed social determinant of health barriers Discussed the importance of adequate rest and management of fatigue with COPD Provided education about and advised patient to utilize infection prevention strategies to reduce risk of respiratory infection Provided instruction about proper use of medications used for management of COPD including inhalers Use of home oxygen  Oxygen  - 2l/min continuous HHPT as directed by the therapist Treatment with Pulmicort  nebs, scheduled Duonebs and Albuterol  nebs every 2 hours as needed. Use of Inhalers - Nebulizer may be used in place of maintenance inhaler if pt is unable to unwilling to take inhaler; may prevent respiratory decompensation and readmission  Start Brovanna 15mcg twice daily Guaifenesin  600mg  twice daily Palliative Care conssult  Pain Interventions: (reviewed 09/11/23) Pain assessment performed Medications  reviewed Reviewed provider established plan for pain management Counseled on the importance of reporting any/all new or changed pain symptoms or management strategies to pain management provider Advised patient to report to care team affect of pain on daily activities Discussed use of relaxation techniques and/or diversional activities to assist with pain reduction (distraction, imagery, relaxation, massage, acupressure, TENS, heat, and cold application Reviewed with patient prescribed pharmacological and nonpharmacological pain relief strategies Lidocaine  patches daily - on for 12 hours then off for 12 hours Monitor Bowel regime - Miralax  daily Tramadol  every 6 hours PRN  Patient Self Care Activities: (reviewed 09/11/23) Attend all scheduled provider appointments Call pharmacy for medication refills 3-7 days in advance of running out of medications Call provider office for new concerns or questions  Notify RN Care Manager of TOC call rescheduling needs Participate in Transition of Care Program/Attend TOC scheduled calls Take medications as prescribed   eliminate smoking in my home identify and remove indoor air pollutants limit outdoor activity during cold weather do breathing exercises every day arrange respite care  for caregiver eliminate symptom triggers at home use devices that will help like a cane, sock-puller or reacher do breathing exercises every day  Plan:  Telephone follow up appointment with care management team member scheduled for:  Tuesday July 8th at 1:00pm        Goals Addressed             This Visit's Progress    VBCI Transitions of Care (TOC) Care Plan       Problems: (reviewed 09/11/23) Recent Hospitalization for treatment of COPD and Low Back Pain  Goal: (reviewed 09/11/23) Over the next 30 days, the patient will not experience hospital readmission  Interventions:   COPD Interventions:  (reviewed 09/11/23) Advised patient to engage in light exercise  as tolerated 3-5 days a week to aid in the the management of COPD Advised patient to track and manage COPD triggers Assessed social determinant of health barriers Discussed the importance of adequate rest and management of fatigue with COPD Provided education about and advised patient to utilize infection prevention strategies to reduce risk of respiratory infection Provided instruction about proper use of medications used for management of COPD including inhalers Use of home oxygen  Oxygen  - 2l/min continuous HHPT as directed by the therapist Treatment with Pulmicort  nebs, scheduled Duonebs and Albuterol  nebs every 2 hours as needed. Use of Inhalers - Nebulizer may be used in place of maintenance inhaler if pt is unable to unwilling to take inhaler; may prevent respiratory decompensation and readmission  Start Brovanna 15mcg twice daily Guaifenesin  600mg  twice daily Palliative Care conssult  Pain Interventions: (reviewed 09/11/23) Pain assessment performed Medications reviewed Reviewed provider established plan for pain management Counseled on the importance of reporting any/all new or changed pain symptoms or management strategies to pain management provider Advised patient to report to care team affect of pain on daily activities Discussed use of relaxation techniques and/or diversional activities to assist with pain reduction (distraction, imagery, relaxation, massage, acupressure, TENS, heat, and cold application Reviewed with patient prescribed pharmacological and nonpharmacological pain relief strategies Lidocaine  patches daily - on for 12 hours then off for 12 hours Monitor Bowel regime - Miralax  daily Tramadol  every 6 hours PRN  Patient Self Care Activities: (reviewed 09/11/23) Attend all scheduled provider appointments Call pharmacy for medication refills 3-7 days in advance of running out of medications Call provider office for new concerns or questions  Notify RN Care Manager  of TOC call rescheduling needs Participate in Transition of Care Program/Attend TOC scheduled calls Take medications as prescribed   eliminate smoking in my home identify and remove indoor air pollutants limit outdoor activity during cold weather do breathing exercises every day arrange respite care for caregiver eliminate symptom triggers at home use devices that will help like a cane, sock-puller or reacher do breathing exercises every day  Plan:  Telephone follow up appointment with care management team member scheduled for:  Tuesday July 8th at 1:00pm        Medford Balboa, BSN, RN [place]  VBCI - [facility] RN Care Manager [phone]

## 2023-09-11 NOTE — Patient Instructions (Signed)
 Visit Information  Thank you for taking time to visit with me today. Please don't hesitate to contact me if I can be of assistance to you before our next scheduled telephone appointment.  Our next appointment is by telephone on July 8th at 1:00pm  Following is a copy of your care plan:   Goals Addressed             This Visit's Progress    VBCI Transitions of Care (TOC) Care Plan       Problems: (reviewed 09/11/23) Recent Hospitalization for treatment of COPD and Low Back Pain  Goal: (reviewed 09/11/23) Over the next 30 days, the patient will not experience hospital readmission  Interventions:   COPD Interventions:  (reviewed 09/11/23) Advised patient to engage in light exercise as tolerated 3-5 days a week to aid in the the management of COPD Advised patient to track and manage COPD triggers Assessed social determinant of health barriers Discussed the importance of adequate rest and management of fatigue with COPD Provided education about and advised patient to utilize infection prevention strategies to reduce risk of respiratory infection Provided instruction about proper use of medications used for management of COPD including inhalers Use of home oxygen  Oxygen  - 2l/min continuous HHPT as directed by the therapist Treatment with Pulmicort  nebs, scheduled Duonebs and Albuterol  nebs every 2 hours as needed. Use of Inhalers - Nebulizer may be used in place of maintenance inhaler if pt is unable to unwilling to take inhaler; may prevent respiratory decompensation and readmission  Start Brovanna 15mcg twice daily Guaifenesin  600mg  twice daily Palliative Care conssult  Pain Interventions: (reviewed 09/11/23) Pain assessment performed Medications reviewed Reviewed provider established plan for pain management Counseled on the importance of reporting any/all new or changed pain symptoms or management strategies to pain management provider Advised patient to report to care team  affect of pain on daily activities Discussed use of relaxation techniques and/or diversional activities to assist with pain reduction (distraction, imagery, relaxation, massage, acupressure, TENS, heat, and cold application Reviewed with patient prescribed pharmacological and nonpharmacological pain relief strategies Lidocaine  patches daily - on for 12 hours then off for 12 hours Monitor Bowel regime - Miralax  daily Tramadol  every 6 hours PRN  Patient Self Care Activities: (reviewed 09/11/23) Attend all scheduled provider appointments Call pharmacy for medication refills 3-7 days in advance of running out of medications Call provider office for new concerns or questions  Notify RN Care Manager of [facility] call rescheduling needs Participate in Transition of Care Program/Attend TOC scheduled calls Take medications as prescribed   eliminate smoking in my home identify and remove indoor air pollutants limit outdoor activity during cold weather do breathing exercises every day arrange respite care for caregiver eliminate symptom triggers at home use devices that will help like a cane, sock-puller or reacher do breathing exercises every day  Plan:  Telephone follow up appointment with care management team member scheduled for:  Tuesday July 8th at 1:00pm        Patient verbalizes understanding of instructions and care plan provided today and agrees to view in MyChart. Active MyChart status and patient understanding of how to access instructions and care plan via MyChart confirmed with patient.     The patient has been provided with contact information for the care management team and has been advised to call with any health related questions or concerns.   Please call the care guide team at [phone] if you need to cancel or reschedule your appointment.  Please call the Suicide and Crisis Lifeline: 988 call the [place]  National Suicide Prevention Lifeline: (709) 491-8729 or TTY: 514-595-7788  TTY 423-646-7746) to talk to a trained counselor if you are experiencing a Mental Health or Behavioral Health Crisis or need someone to talk to.  Medford Balboa, BSN, RN [place]  VBCI - [employer] [facility] RN Care Manager [phone]

## 2023-09-17 DIAGNOSIS — E1165 Type 2 diabetes mellitus with hyperglycemia: Secondary | ICD-10-CM | POA: Diagnosis not present

## 2023-09-17 DIAGNOSIS — C3431 Malignant neoplasm of lower lobe, right bronchus or lung: Secondary | ICD-10-CM | POA: Diagnosis not present

## 2023-09-17 DIAGNOSIS — J441 Chronic obstructive pulmonary disease with (acute) exacerbation: Secondary | ICD-10-CM | POA: Diagnosis not present

## 2023-09-17 DIAGNOSIS — I119 Hypertensive heart disease without heart failure: Secondary | ICD-10-CM | POA: Diagnosis not present

## 2023-09-17 DIAGNOSIS — S22068D Other fracture of T7-T8 thoracic vertebra, subsequent encounter for fracture with routine healing: Secondary | ICD-10-CM | POA: Diagnosis not present

## 2023-09-17 DIAGNOSIS — J9611 Chronic respiratory failure with hypoxia: Secondary | ICD-10-CM | POA: Diagnosis not present

## 2023-09-19 DIAGNOSIS — Z9981 Dependence on supplemental oxygen: Secondary | ICD-10-CM | POA: Diagnosis not present

## 2023-09-19 DIAGNOSIS — J9621 Acute and chronic respiratory failure with hypoxia: Secondary | ICD-10-CM | POA: Diagnosis not present

## 2023-09-19 DIAGNOSIS — E7800 Pure hypercholesterolemia, unspecified: Secondary | ICD-10-CM | POA: Diagnosis not present

## 2023-09-19 DIAGNOSIS — M1990 Unspecified osteoarthritis, unspecified site: Secondary | ICD-10-CM | POA: Diagnosis not present

## 2023-09-19 DIAGNOSIS — Z7984 Long term (current) use of oral hypoglycemic drugs: Secondary | ICD-10-CM | POA: Diagnosis not present

## 2023-09-19 DIAGNOSIS — S22068D Other fracture of T7-T8 thoracic vertebra, subsequent encounter for fracture with routine healing: Secondary | ICD-10-CM | POA: Diagnosis not present

## 2023-09-19 DIAGNOSIS — F319 Bipolar disorder, unspecified: Secondary | ICD-10-CM | POA: Diagnosis not present

## 2023-09-19 DIAGNOSIS — K219 Gastro-esophageal reflux disease without esophagitis: Secondary | ICD-10-CM | POA: Diagnosis not present

## 2023-09-19 DIAGNOSIS — F209 Schizophrenia, unspecified: Secondary | ICD-10-CM | POA: Diagnosis not present

## 2023-09-19 DIAGNOSIS — N400 Enlarged prostate without lower urinary tract symptoms: Secondary | ICD-10-CM | POA: Diagnosis not present

## 2023-09-19 DIAGNOSIS — E1165 Type 2 diabetes mellitus with hyperglycemia: Secondary | ICD-10-CM | POA: Diagnosis not present

## 2023-09-19 DIAGNOSIS — E559 Vitamin D deficiency, unspecified: Secondary | ICD-10-CM | POA: Diagnosis not present

## 2023-09-19 DIAGNOSIS — I119 Hypertensive heart disease without heart failure: Secondary | ICD-10-CM | POA: Diagnosis not present

## 2023-09-19 DIAGNOSIS — C3431 Malignant neoplasm of lower lobe, right bronchus or lung: Secondary | ICD-10-CM | POA: Diagnosis not present

## 2023-09-19 DIAGNOSIS — J441 Chronic obstructive pulmonary disease with (acute) exacerbation: Secondary | ICD-10-CM | POA: Diagnosis not present

## 2023-09-22 ENCOUNTER — Other Ambulatory Visit: Payer: Self-pay

## 2023-09-22 DIAGNOSIS — J9621 Acute and chronic respiratory failure with hypoxia: Secondary | ICD-10-CM | POA: Diagnosis not present

## 2023-09-22 DIAGNOSIS — I119 Hypertensive heart disease without heart failure: Secondary | ICD-10-CM | POA: Diagnosis not present

## 2023-09-22 DIAGNOSIS — J441 Chronic obstructive pulmonary disease with (acute) exacerbation: Secondary | ICD-10-CM | POA: Diagnosis not present

## 2023-09-22 DIAGNOSIS — E1165 Type 2 diabetes mellitus with hyperglycemia: Secondary | ICD-10-CM | POA: Diagnosis not present

## 2023-09-22 DIAGNOSIS — S22068D Other fracture of T7-T8 thoracic vertebra, subsequent encounter for fracture with routine healing: Secondary | ICD-10-CM | POA: Diagnosis not present

## 2023-09-22 DIAGNOSIS — C3431 Malignant neoplasm of lower lobe, right bronchus or lung: Secondary | ICD-10-CM | POA: Diagnosis not present

## 2023-09-22 NOTE — Patient Instructions (Signed)
 Visit Information  Thank you for taking time to visit with me today. Please don't hesitate to contact me if I can be of assistance to you before our next scheduled telephone appointment.  Our next appointment is by telephone on Tuesday July 15th at 1:00pm  Following is a copy of your care plan:   Goals Addressed   None     Patient verbalizes understanding of instructions and care plan provided today and agrees to view in MyChart. Active MyChart status and patient understanding of how to access instructions and care plan via MyChart confirmed with patient.     The patient has been provided with contact information for the care management team and has been advised to call with any health related questions or concerns.   Please call the care guide team at [phone] if you need to cancel or reschedule your appointment.   Please call the Suicide and Crisis Lifeline: 988 call the [place]  National Suicide Prevention Lifeline: (707)216-0437 or TTY: 325-701-9079 TTY 585-624-2348) to talk to a trained counselor if you are experiencing a Mental Health or Behavioral Health Crisis or need someone to talk to.  Medford Balboa, BSN, RN [place]  VBCI - [employer] [facility] RN Care Manager [phone]

## 2023-09-22 NOTE — Transitions of Care (Post Inpatient/ED Visit) (Signed)
 Transition of Care week 2  Visit Note  09/22/2023  Name: Blake Mcknight MRN: [id number]          DOB: September 21, 1955  Situation: Patient enrolled in [facility] 30-day program. Visit completed with [name] Slade by telephone.   Background:    Past Medical History:  Diagnosis Date   Abdominal pain 06/03/2013   Chest pain 08/10/2023   COPD (chronic obstructive pulmonary disease) (HCC)    with bullous emphysema.    COPD exacerbation (HCC) 08/09/2023   Heavy cigarette smoker before 2003   pt claims only 10 cigs per day, never heavier amounts.    HLD (hyperlipidemia)    Hypertension    Schizophrenia (HCC) 06/28/2013   This is a chronic condition and he lives with family    Assessment: Patient Reported Symptoms: Cognitive Cognitive Status: Alert and oriented to person, place, and time, Difficulties with attention and concentration, Poor judgment in daily scenarios, Struggling with memory recall Cognitive/Intellectual Conditions Management [RPT]: Behavior Disorders Behavior Disorders: Schizophrenia   Health Maintenance Behaviors: Spiritual practice(s)  Neurological Neurological Review of Symptoms: No symptoms reported    HEENT HEENT Symptoms Reported: No symptoms reported      Cardiovascular Cardiovascular Symptoms Reported: No symptoms reported Does patient have uncontrolled Hypertension?: No Weight: 127 lb (57.6 kg)  Respiratory Respiratory Symptoms Reported: Shortness of breath, Wheezing Additional Respiratory Details: The patient has mild respiratory distress all the time Respiratory Management Strategies: Breathing techniques, Oxygen  therapy, Routine screening, Medication therapy, Activity Respiratory Self-Management Outcome: 3 (uncertain)  Endocrine Endocrine Symptoms Reported: No symptoms reported Is patient diabetic?: No    Gastrointestinal Gastrointestinal Symptoms Reported: Constipation Additional Gastrointestinal Details: The patient is moving his bowels more  regularly Gastrointestinal Management Strategies: Medication therapy, Fluid modification Gastrointestinal Self-Management Outcome: 4 (good) Nutrition Risk Screen (CP): Reduced oral intake over the last month (The patient drinks 2-3 protein drinks a day)  Genitourinary Genitourinary Symptoms Reported: No symptoms reported    Integumentary Integumentary Symptoms Reported: No symptoms reported    Musculoskeletal Musculoskelatal Symptoms Reviewed: Weakness, Unsteady gait, Back pain Additional Musculoskeletal Details: HHPT to start this week. The patient reports the pain is easing off Musculoskeletal Management Strategies: Medication therapy, Routine screening, Activity, Adequate rest Musculoskeletal Self-Management Outcome: 3 (uncertain) Musculoskeletal Comment: Improving Falls in the past year?: Yes Number of falls in past year: 1 or less Was there an injury with Fall?: Yes Fall Risk Category Calculator: 2 Patient Fall Risk Level: Moderate Fall Risk Patient at Risk for Falls Due to: History of fall(s)  Psychosocial Psychosocial Symptoms Reported: Phobias, Psychosis Additional Psychological Details: Schizophrenia Behavioral Management Strategies: Support system, Medication therapy, Coping strategies Behavioral Health Self-Management Outcome: 4 (good)       There were no vitals filed for this visit.  Medications Reviewed Today     Reviewed by Moises Reusing, RN (Case Manager) on 09/22/23 at 1327  Med List Status: <None>   Medication Order Taking? Sig Documenting Provider Last Dose Status Informant  acetaminophen  (TYLENOL ) 500 MG tablet [id number]  Take 2 tablets (1,000 mg total) by mouth every 8 (eight) hours. Fausto Burnard LABOR, DO  Active Family Member  albuterol  (PROVENTIL ) (2.5 MG/3ML) 0.083% nebulizer solution [id number]  Take 3 mLs (2.5 mg total) by nebulization every 4 (four) hours as needed for wheezing or shortness of breath. Fausto Burnard LABOR, DO  Active Family Member   albuterol  (VENTOLIN  HFA) 108 [phone] Base) MCG/ACT inhaler [id number]  Inhale 2 puffs into the lungs every 4 (four) hours as needed for wheezing or  shortness of breath. Bernard Drivers, MD  Active Family Member  arformoterol  (BROVANA ) 15 MCG/2ML NEBU [id number]  Take 2 mLs (15 mcg total) by nebulization 2 (two) times daily. Will Almarie MATSU, MD  Active   atorvastatin  (LIPITOR) 10 MG tablet [id number]  Take 1 tablet (10 mg total) by mouth at bedtime. Fausto Burnard LABOR, DO  Active Family Member  benztropine  (COGENTIN ) 2 MG tablet [id number]  Take 1 tablet (2 mg total) by mouth 2 (two) times daily. Arfeen, Syed T, MD  Active Family Member  budesonide  (PULMICORT ) 0.25 MG/2ML nebulizer solution [id number]  Take 2 mLs (0.25 mg total) by nebulization 2 (two) times daily. Fausto Burnard LABOR, DO  Active Family Member  budesonide -glycopyrrolate -formoterol  (BREZTRI AEROSPHERE) 160-9-4.8 MCG/ACT AERO inhaler [id number]  Inhale 1-2 puffs into the lungs 2 (two) times daily. [provider]  Active Family Member  carvedilol  (COREG ) 3.125 MG tablet [id number]  Take 2 tablets (6.25 mg total) by mouth 2 (two) times daily with a meal. Will Almarie MATSU, MD  Active   cetirizine (ZYRTEC) 10 MG tablet [id number]  Take 10 mg by mouth daily as needed for allergies or rhinitis. [provider]  Active Family Member  cholecalciferol (VITAMIN D) 25 MCG (1000 UNIT) tablet [id number]  Take 5,000 Units by mouth every morning. [provider]  Active Family Member  famotidine  (PEPCID ) 40 MG tablet [id number]  Take 40 mg by mouth 2 (two) times daily. [provider]  Active Family Member  feeding supplement, GLUCERNA SHAKE, (GLUCERNA SHAKE) LIQD [id number]  Take 237 mLs by mouth 3 (three) times daily between meals. Fausto Burnard LABOR, DO  Active Family Member  ibuprofen  (ADVIL ) 400 MG tablet [id number]  Take 1 tablet (400 mg total) by mouth every 6 (six) hours as needed (moderate to severe pain). Fausto Burnard LABOR, DO  Active Family Member  ipratropium-albuterol  (DUONEB) 0.5-2.5 (3) MG/3ML SOLN [id number]  Take 3 mLs by nebulization every 4 (four) hours. Fausto Burnard LABOR, DO  Active Family Member  JANUVIA 100 MG tablet [id number]  Take 100 mg by mouth daily. [provider]  Active Family Member           Med Note [facility], DONETA GORMAN Repress Aug 09, 2023 11:35 PM)    lidocaine  (LIDODERM ) 5 % [id number]  Place 1 patch onto the skin daily. Remove & Discard patch within 12 hours or as directed by MD Will Almarie MATSU, MD  Active   methocarbamol  (ROBAXIN ) 750 MG tablet [id number]  Take 1 tablet (750 mg total) by mouth every 8 (eight) hours as needed for muscle spasms. Will Almarie MATSU, MD  Active   mirtazapine  (REMERON ) 30 MG tablet [id number]  Take 1 tablet (30 mg total) by mouth at bedtime as needed (insomnia).  Patient taking differently: Take 30 mg by mouth at bedtime.   Arfeen, Syed T, MD  Active Family Member  multivitamin (ONE-A-DAY MEN'S) TABS tablet [id number]  Take 1 tablet by mouth daily. [provider]  Active Family Member           Med Note (WHITE, DONETA GORMAN Schaumann Apr 16, 2023  3:18 AM) Taken occasionally   ondansetron  (ZOFRAN ) 4 MG tablet [id number]  Take 1 tablet (4 mg total) by mouth every 6 (six) hours as needed for nausea. Will Almarie MATSU, MD  Active   OXYGEN  [id number]  Inhale 2 L/min into the lungs continuous.  [provider]  Active Family Member  polyethylene glycol (MIRALAX  / GLYCOLAX ) 17  g packet [id number]  Take 17 g by mouth daily as needed for mild constipation.  Patient not taking: Reported on 09/11/2023   [provider]  Active Family Member  polyethylene glycol (MIRALAX  / GLYCOLAX ) 17 g packet [id number]  Take 17 g by mouth daily. Will Almarie MATSU, MD  Active   risperiDONE  (RISPERDAL ) 3 MG tablet [id number]  Take 1 tablet (3 mg total) by mouth 2 (two) times daily. Arfeen, Syed T, MD  Active Family Member  sodium chloride  (OCEAN) 0.65 % SOLN  nasal spray [id number]  Place 1 spray into both nostrils as needed for congestion. [provider]  Active Family Member  tamsulosin  (FLOMAX ) 0.4 MG CAPS capsule [id number]  Take 0.4 mg by mouth in the morning and at bedtime. [provider]  Active Family Member            Recommendation:   Continue Current Plan of Care  Follow Up Plan:   Telephone follow-up in 1 week  Medford Balboa, BSN, RN [place]  [facility] - [facility] RN Care Manager [phone]

## 2023-09-23 DIAGNOSIS — J9621 Acute and chronic respiratory failure with hypoxia: Secondary | ICD-10-CM | POA: Diagnosis not present

## 2023-09-23 DIAGNOSIS — J441 Chronic obstructive pulmonary disease with (acute) exacerbation: Secondary | ICD-10-CM | POA: Diagnosis not present

## 2023-09-23 DIAGNOSIS — E1165 Type 2 diabetes mellitus with hyperglycemia: Secondary | ICD-10-CM | POA: Diagnosis not present

## 2023-09-23 DIAGNOSIS — S22068D Other fracture of T7-T8 thoracic vertebra, subsequent encounter for fracture with routine healing: Secondary | ICD-10-CM | POA: Diagnosis not present

## 2023-09-23 DIAGNOSIS — I119 Hypertensive heart disease without heart failure: Secondary | ICD-10-CM | POA: Diagnosis not present

## 2023-09-23 DIAGNOSIS — C3431 Malignant neoplasm of lower lobe, right bronchus or lung: Secondary | ICD-10-CM | POA: Diagnosis not present

## 2023-09-28 DIAGNOSIS — S22068D Other fracture of T7-T8 thoracic vertebra, subsequent encounter for fracture with routine healing: Secondary | ICD-10-CM | POA: Diagnosis not present

## 2023-09-28 DIAGNOSIS — J441 Chronic obstructive pulmonary disease with (acute) exacerbation: Secondary | ICD-10-CM | POA: Diagnosis not present

## 2023-09-28 DIAGNOSIS — E1165 Type 2 diabetes mellitus with hyperglycemia: Secondary | ICD-10-CM | POA: Diagnosis not present

## 2023-09-28 DIAGNOSIS — I119 Hypertensive heart disease without heart failure: Secondary | ICD-10-CM | POA: Diagnosis not present

## 2023-09-28 DIAGNOSIS — C3431 Malignant neoplasm of lower lobe, right bronchus or lung: Secondary | ICD-10-CM | POA: Diagnosis not present

## 2023-09-28 DIAGNOSIS — J9621 Acute and chronic respiratory failure with hypoxia: Secondary | ICD-10-CM | POA: Diagnosis not present

## 2023-09-29 ENCOUNTER — Telehealth: Payer: Self-pay

## 2023-09-29 DIAGNOSIS — J441 Chronic obstructive pulmonary disease with (acute) exacerbation: Secondary | ICD-10-CM | POA: Diagnosis not present

## 2023-09-29 DIAGNOSIS — C3431 Malignant neoplasm of lower lobe, right bronchus or lung: Secondary | ICD-10-CM | POA: Diagnosis not present

## 2023-09-29 DIAGNOSIS — M1990 Unspecified osteoarthritis, unspecified site: Secondary | ICD-10-CM | POA: Diagnosis not present

## 2023-09-29 DIAGNOSIS — Z79891 Long term (current) use of opiate analgesic: Secondary | ICD-10-CM | POA: Diagnosis not present

## 2023-09-29 DIAGNOSIS — E7800 Pure hypercholesterolemia, unspecified: Secondary | ICD-10-CM | POA: Diagnosis not present

## 2023-09-29 DIAGNOSIS — N401 Enlarged prostate with lower urinary tract symptoms: Secondary | ICD-10-CM | POA: Diagnosis not present

## 2023-09-29 DIAGNOSIS — J9621 Acute and chronic respiratory failure with hypoxia: Secondary | ICD-10-CM | POA: Diagnosis not present

## 2023-09-29 DIAGNOSIS — E1165 Type 2 diabetes mellitus with hyperglycemia: Secondary | ICD-10-CM | POA: Diagnosis not present

## 2023-09-29 DIAGNOSIS — M2550 Pain in unspecified joint: Secondary | ICD-10-CM | POA: Diagnosis not present

## 2023-09-29 DIAGNOSIS — K2100 Gastro-esophageal reflux disease with esophagitis, without bleeding: Secondary | ICD-10-CM | POA: Diagnosis not present

## 2023-09-29 DIAGNOSIS — S22060A Wedge compression fracture of T7-T8 vertebra, initial encounter for closed fracture: Secondary | ICD-10-CM | POA: Diagnosis not present

## 2023-09-29 DIAGNOSIS — E559 Vitamin D deficiency, unspecified: Secondary | ICD-10-CM | POA: Diagnosis not present

## 2023-09-29 DIAGNOSIS — S22068D Other fracture of T7-T8 thoracic vertebra, subsequent encounter for fracture with routine healing: Secondary | ICD-10-CM | POA: Diagnosis not present

## 2023-09-29 DIAGNOSIS — I119 Hypertensive heart disease without heart failure: Secondary | ICD-10-CM | POA: Diagnosis not present

## 2023-09-29 DIAGNOSIS — F319 Bipolar disorder, unspecified: Secondary | ICD-10-CM | POA: Diagnosis not present

## 2023-09-30 ENCOUNTER — Telehealth (HOSPITAL_COMMUNITY): Payer: Self-pay | Admitting: Pharmacy Technician

## 2023-09-30 ENCOUNTER — Inpatient Hospital Stay (HOSPITAL_COMMUNITY)

## 2023-09-30 ENCOUNTER — Emergency Department (HOSPITAL_COMMUNITY)

## 2023-09-30 ENCOUNTER — Other Ambulatory Visit: Payer: Self-pay

## 2023-09-30 ENCOUNTER — Other Ambulatory Visit (HOSPITAL_COMMUNITY): Payer: Self-pay

## 2023-09-30 ENCOUNTER — Observation Stay (HOSPITAL_COMMUNITY)
Admission: EM | Admit: 2023-09-30 | Discharge: 2023-10-01 | Disposition: A | Discharge status: Home Health | Attending: Family Medicine | Admitting: Family Medicine

## 2023-09-30 ENCOUNTER — Observation Stay (HOSPITAL_COMMUNITY)

## 2023-09-30 DIAGNOSIS — I10 Essential (primary) hypertension: Secondary | ICD-10-CM | POA: Insufficient documentation

## 2023-09-30 DIAGNOSIS — R0902 Hypoxemia: Secondary | ICD-10-CM | POA: Diagnosis not present

## 2023-09-30 DIAGNOSIS — E119 Type 2 diabetes mellitus without complications: Secondary | ICD-10-CM | POA: Insufficient documentation

## 2023-09-30 DIAGNOSIS — J449 Chronic obstructive pulmonary disease, unspecified: Secondary | ICD-10-CM | POA: Diagnosis not present

## 2023-09-30 DIAGNOSIS — J9601 Acute respiratory failure with hypoxia: Principal | ICD-10-CM | POA: Diagnosis present

## 2023-09-30 DIAGNOSIS — Z87891 Personal history of nicotine dependence: Secondary | ICD-10-CM | POA: Insufficient documentation

## 2023-09-30 DIAGNOSIS — J9621 Acute and chronic respiratory failure with hypoxia: Principal | ICD-10-CM | POA: Insufficient documentation

## 2023-09-30 DIAGNOSIS — Z79899 Other long term (current) drug therapy: Secondary | ICD-10-CM | POA: Insufficient documentation

## 2023-09-30 DIAGNOSIS — Z86711 Personal history of pulmonary embolism: Secondary | ICD-10-CM

## 2023-09-30 DIAGNOSIS — N400 Enlarged prostate without lower urinary tract symptoms: Secondary | ICD-10-CM | POA: Diagnosis not present

## 2023-09-30 DIAGNOSIS — J90 Pleural effusion, not elsewhere classified: Secondary | ICD-10-CM | POA: Diagnosis not present

## 2023-09-30 DIAGNOSIS — C3430 Malignant neoplasm of lower lobe, unspecified bronchus or lung: Secondary | ICD-10-CM | POA: Diagnosis not present

## 2023-09-30 DIAGNOSIS — R000 Tachycardia, unspecified: Secondary | ICD-10-CM | POA: Diagnosis not present

## 2023-09-30 DIAGNOSIS — R918 Other nonspecific abnormal finding of lung field: Secondary | ICD-10-CM | POA: Diagnosis not present

## 2023-09-30 DIAGNOSIS — J441 Chronic obstructive pulmonary disease with (acute) exacerbation: Secondary | ICD-10-CM

## 2023-09-30 DIAGNOSIS — I2699 Other pulmonary embolism without acute cor pulmonale: Secondary | ICD-10-CM | POA: Insufficient documentation

## 2023-09-30 DIAGNOSIS — K3189 Other diseases of stomach and duodenum: Secondary | ICD-10-CM | POA: Diagnosis not present

## 2023-09-30 DIAGNOSIS — I2609 Other pulmonary embolism with acute cor pulmonale: Secondary | ICD-10-CM | POA: Insufficient documentation

## 2023-09-30 DIAGNOSIS — R069 Unspecified abnormalities of breathing: Secondary | ICD-10-CM | POA: Diagnosis not present

## 2023-09-30 DIAGNOSIS — Z85118 Personal history of other malignant neoplasm of bronchus and lung: Secondary | ICD-10-CM | POA: Insufficient documentation

## 2023-09-30 DIAGNOSIS — E785 Hyperlipidemia, unspecified: Secondary | ICD-10-CM | POA: Insufficient documentation

## 2023-09-30 DIAGNOSIS — J439 Emphysema, unspecified: Secondary | ICD-10-CM | POA: Diagnosis not present

## 2023-09-30 DIAGNOSIS — R0602 Shortness of breath: Secondary | ICD-10-CM | POA: Diagnosis not present

## 2023-09-30 DIAGNOSIS — F209 Schizophrenia, unspecified: Secondary | ICD-10-CM | POA: Insufficient documentation

## 2023-09-30 LAB — CBC WITH DIFFERENTIAL/PLATELET
Abs Immature Granulocytes: 0.03 K/uL (ref 0.00–0.07)
Basophils Absolute: 0.1 K/uL (ref 0.0–0.1)
Basophils Relative: 1 %
Eosinophils Absolute: 0.7 K/uL — ABNORMAL HIGH (ref 0.0–0.5)
Eosinophils Relative: 7 %
HCT: 37.1 % — ABNORMAL LOW (ref 39.0–52.0)
Hemoglobin: 11.9 g/dL — ABNORMAL LOW (ref 13.0–17.0)
Immature Granulocytes: 0 %
Lymphocytes Relative: 33 %
Lymphs Abs: 3.2 K/uL (ref 0.7–4.0)
MCH: 27.6 pg (ref 26.0–34.0)
MCHC: 32.1 g/dL (ref 30.0–36.0)
MCV: 86.1 fL (ref 80.0–100.0)
Monocytes Absolute: 1 K/uL (ref 0.1–1.0)
Monocytes Relative: 10 %
Neutro Abs: 4.8 K/uL (ref 1.7–7.7)
Neutrophils Relative %: 49 %
Platelets: 396 K/uL (ref 150–400)
RBC: 4.31 MIL/uL (ref 4.22–5.81)
RDW: 15.2 % (ref 11.5–15.5)
WBC: 9.7 K/uL (ref 4.0–10.5)
nRBC: 0 % (ref 0.0–0.2)

## 2023-09-30 LAB — I-STAT ARTERIAL BLOOD GAS, ED
Acid-base deficit: 1 mmol/L (ref 0.0–2.0)
Bicarbonate: 25.1 mmol/L (ref 20.0–28.0)
Calcium, Ion: 1.22 mmol/L (ref 1.15–1.40)
HCT: 35 % — ABNORMAL LOW (ref 39.0–52.0)
Hemoglobin: 11.9 g/dL — ABNORMAL LOW (ref 13.0–17.0)
O2 Saturation: 99 %
Patient temperature: 98.7
Potassium: 3.9 mmol/L (ref 3.5–5.1)
Sodium: 132 mmol/L — ABNORMAL LOW (ref 135–145)
TCO2: 27 mmol/L (ref 22–32)
pCO2 arterial: 48.4 mmHg — ABNORMAL HIGH (ref 32–48)
pH, Arterial: 7.324 — ABNORMAL LOW (ref 7.35–7.45)
pO2, Arterial: 136 mmHg — ABNORMAL HIGH (ref 83–108)

## 2023-09-30 LAB — COMPREHENSIVE METABOLIC PANEL WITH GFR
ALT: 13 U/L (ref 0–44)
AST: 16 U/L (ref 15–41)
Albumin: 2.7 g/dL — ABNORMAL LOW (ref 3.5–5.0)
Alkaline Phosphatase: 69 U/L (ref 38–126)
Anion gap: 13 (ref 5–15)
BUN: <5 mg/dL — ABNORMAL LOW (ref 8–23)
CO2: 23 mmol/L (ref 22–32)
Calcium: 8.7 mg/dL — ABNORMAL LOW (ref 8.9–10.3)
Chloride: 97 mmol/L — ABNORMAL LOW (ref 98–111)
Creatinine, Ser: 0.68 mg/dL (ref 0.61–1.24)
GFR, Estimated: >60 mL/min (ref >60)
Glucose, Bld: 215 mg/dL — ABNORMAL HIGH (ref 70–99)
Potassium: 4.4 mmol/L (ref 3.5–5.1)
Sodium: 133 mmol/L — ABNORMAL LOW (ref 135–145)
Total Bilirubin: 0.6 mg/dL (ref 0.0–1.2)
Total Protein: 6.8 g/dL (ref 6.5–8.1)

## 2023-09-30 LAB — TROPONIN I (HIGH SENSITIVITY)
Troponin I (High Sensitivity): 13 ng/L (ref <18)
Troponin I (High Sensitivity): 23 ng/L — ABNORMAL HIGH (ref <18)

## 2023-09-30 LAB — BRAIN NATRIURETIC PEPTIDE: B Natriuretic Peptide: 28.4 pg/mL (ref 0.0–100.0)

## 2023-09-30 LAB — CBG MONITORING, ED
Glucose-Capillary: 130 mg/dL — ABNORMAL HIGH (ref 70–99)
Glucose-Capillary: 133 mg/dL — ABNORMAL HIGH (ref 70–99)

## 2023-09-30 LAB — PROCALCITONIN: Procalcitonin: 0.24 ng/mL

## 2023-09-30 LAB — C-REACTIVE PROTEIN: CRP: 4.3 mg/dL — ABNORMAL HIGH (ref <1.0)

## 2023-09-30 MED ORDER — IPRATROPIUM-ALBUTEROL 0.5-2.5 (3) MG/3ML IN SOLN
3.0000 mL | Freq: Once | RESPIRATORY_TRACT | Status: AC
  Administered 2023-09-30: 3 mL via RESPIRATORY_TRACT
  Filled 2023-09-30: qty 3

## 2023-09-30 MED ORDER — IPRATROPIUM BROMIDE 0.02 % IN SOLN
0.5000 mg | Freq: Once | RESPIRATORY_TRACT | Status: AC
  Administered 2023-09-30: 0.5 mg via RESPIRATORY_TRACT
  Filled 2023-09-30: qty 2.5

## 2023-09-30 MED ORDER — ENOXAPARIN SODIUM 40 MG/0.4ML IJ SOSY
40.0000 mg | PREFILLED_SYRINGE | Freq: Every day | INTRAMUSCULAR | Status: DC
  Administered 2023-09-30: 40 mg via SUBCUTANEOUS
  Filled 2023-09-30: qty 0.4

## 2023-09-30 MED ORDER — APIXABAN 5 MG PO TABS: 5.0000 mg | ORAL_TABLET | Freq: Two times a day (BID) | ORAL | Status: DC

## 2023-09-30 MED ORDER — IOHEXOL 350 MG/ML SOLN
75.0000 mL | Freq: Once | INTRAVENOUS | Status: AC | PRN
  Administered 2023-09-30: 75 mL via INTRAVENOUS

## 2023-09-30 MED ORDER — RISPERIDONE 3 MG PO TABS
3.0000 mg | ORAL_TABLET | Freq: Two times a day (BID) | ORAL | Status: DC
  Administered 2023-09-30 – 2023-10-01 (×3): 3 mg via ORAL
  Filled 2023-09-30 (×6): qty 1

## 2023-09-30 MED ORDER — IPRATROPIUM-ALBUTEROL 0.5-2.5 (3) MG/3ML IN SOLN: 3.0000 mL | Freq: Four times a day (QID) | RESPIRATORY_TRACT | Status: DC

## 2023-09-30 MED ORDER — IPRATROPIUM BROMIDE 0.02 % IN SOLN
RESPIRATORY_TRACT | Status: AC
  Filled 2023-09-30: qty 2.5

## 2023-09-30 MED ORDER — LACTATED RINGERS IV BOLUS
1000.0000 mL | Freq: Once | INTRAVENOUS | Status: AC
  Administered 2023-09-30: 1000 mL via INTRAVENOUS

## 2023-09-30 MED ORDER — APIXABAN 5 MG PO TABS
10.0000 mg | ORAL_TABLET | Freq: Two times a day (BID) | ORAL | Status: DC
  Administered 2023-10-01: 10 mg via ORAL
  Filled 2023-09-30: qty 2

## 2023-09-30 MED ORDER — ALBUTEROL SULFATE (2.5 MG/3ML) 0.083% IN NEBU: 2.5000 mg | INHALATION_SOLUTION | RESPIRATORY_TRACT | Status: DC | PRN

## 2023-09-30 MED ORDER — ATORVASTATIN CALCIUM 10 MG PO TABS
10.0000 mg | ORAL_TABLET | Freq: Every day | ORAL | Status: DC
  Administered 2023-09-30: 10 mg via ORAL
  Filled 2023-09-30: qty 1

## 2023-09-30 MED ORDER — IPRATROPIUM-ALBUTEROL 0.5-2.5 (3) MG/3ML IN SOLN
3.0000 mL | RESPIRATORY_TRACT | Status: DC
  Administered 2023-09-30 – 2023-10-01 (×7): 3 mL via RESPIRATORY_TRACT
  Filled 2023-09-30 (×6): qty 3

## 2023-09-30 MED ORDER — MIRTAZAPINE 15 MG PO TABS: 30.0000 mg | ORAL_TABLET | Freq: Every evening | ORAL | Status: DC | PRN

## 2023-09-30 MED ORDER — ARFORMOTEROL TARTRATE 15 MCG/2ML IN NEBU
15.0000 ug | INHALATION_SOLUTION | Freq: Two times a day (BID) | RESPIRATORY_TRACT | Status: DC
  Administered 2023-09-30 – 2023-10-01 (×3): 15 ug via RESPIRATORY_TRACT
  Filled 2023-09-30 (×3): qty 2

## 2023-09-30 MED ORDER — ALBUTEROL SULFATE (2.5 MG/3ML) 0.083% IN NEBU: 10.0000 mg | INHALATION_SOLUTION | Freq: Once | RESPIRATORY_TRACT | Status: AC

## 2023-09-30 MED ORDER — BUDESONIDE 0.25 MG/2ML IN SUSP
0.2500 mg | Freq: Two times a day (BID) | RESPIRATORY_TRACT | Status: DC
  Administered 2023-09-30 – 2023-10-01 (×2): 0.25 mg via RESPIRATORY_TRACT
  Filled 2023-09-30 (×3): qty 2

## 2023-09-30 MED ORDER — INSULIN ASPART 100 UNIT/ML IJ SOLN
0.0000 [IU] | Freq: Three times a day (TID) | INTRAMUSCULAR | Status: DC
  Administered 2023-09-30 (×2): 1 [IU] via SUBCUTANEOUS

## 2023-09-30 MED ORDER — SODIUM CHLORIDE 0.9 % IV SOLN: INTRAVENOUS | Status: DC

## 2023-09-30 MED ORDER — ENOXAPARIN SODIUM 300 MG/3ML IJ SOLN
20.0000 mg | Freq: Once | INTRAMUSCULAR | Status: DC
  Filled 2023-09-30: qty 0.2

## 2023-09-30 MED ORDER — BENZTROPINE MESYLATE 2 MG PO TABS
2.0000 mg | ORAL_TABLET | Freq: Two times a day (BID) | ORAL | Status: DC
  Administered 2023-09-30 – 2023-10-01 (×3): 2 mg via ORAL
  Filled 2023-09-30 (×5): qty 1

## 2023-09-30 MED ORDER — ALBUTEROL SULFATE (2.5 MG/3ML) 0.083% IN NEBU
INHALATION_SOLUTION | RESPIRATORY_TRACT | Status: AC
  Administered 2023-09-30: 10 mg via RESPIRATORY_TRACT
  Filled 2023-09-30: qty 12

## 2023-09-30 MED ORDER — METHOCARBAMOL 500 MG PO TABS: 750.0000 mg | ORAL_TABLET | Freq: Three times a day (TID) | ORAL | Status: DC | PRN

## 2023-09-30 MED ORDER — METHYLPREDNISOLONE SODIUM SUCC 125 MG IJ SOLR
80.0000 mg | Freq: Every day | INTRAMUSCULAR | Status: DC
  Administered 2023-09-30 – 2023-10-01 (×2): 80 mg via INTRAVENOUS
  Filled 2023-09-30 (×2): qty 2

## 2023-09-30 MED ORDER — INSULIN ASPART 100 UNIT/ML IJ SOLN: 0.0000 [IU] | Freq: Every day | INTRAMUSCULAR | Status: DC

## 2023-09-30 MED ORDER — ENOXAPARIN SODIUM 30 MG/0.3ML IJ SOSY
30.0000 mg | PREFILLED_SYRINGE | Freq: Once | INTRAMUSCULAR | Status: AC
  Administered 2023-09-30: 30 mg via SUBCUTANEOUS
  Filled 2023-09-30: qty 0.3

## 2023-09-30 MED ORDER — CARVEDILOL 3.125 MG PO TABS
3.1250 mg | ORAL_TABLET | Freq: Two times a day (BID) | ORAL | Status: DC
  Administered 2023-09-30 – 2023-10-01 (×3): 3.125 mg via ORAL
  Filled 2023-09-30 (×3): qty 1

## 2023-09-30 NOTE — ED Notes (Signed)
 Warm blankets applied

## 2023-09-30 NOTE — ED Notes (Signed)
 Patient transported to CT

## 2023-09-30 NOTE — ED Provider Notes (Signed)
 [place] EMERGENCY DEPARTMENT AT [facility] Provider Note   CSN: [id number] Arrival date & time: 09/30/23  0431     Patient presents with: Shortness of Breath   Blake Mcknight is a 68 y.o. male.   68 y.o. male with medical history significant of COPD and chronic respiratory failure on 2L home O2, hx lung CA s/p SBRT, HTN, HLD, schizophrenia, and DM type II presents with respiratory distress.  He apparently woke from sleep at 3 AM with difficulty breathing.  O2 saturations were in the 70s on room air.  CPR initiated having Solu-Medrol  in emergency room.  He denies chest pain.  Has a dry cough which is apparently unchanged.  His family attempted to give him a breathing treatment was unsuccessful.  Patient with recent admission for same.  No leg pain or leg swelling.  Does not wear oxygen  at home.  Unable to give a history due to dyspnea.  The history is provided by the patient and the EMS personnel. The history is limited by the condition of the patient.  Shortness of Breath      Prior to Admission medications   Medication Sig Start Date End Date Taking? Authorizing Provider  acetaminophen  (TYLENOL ) 500 MG tablet Take 2 tablets (1,000 mg total) by mouth every 8 (eight) hours. 08/14/23   Fausto Burnard LABOR, DO  albuterol  (PROVENTIL ) (2.5 MG/3ML) 0.083% nebulizer solution Take 3 mLs (2.5 mg total) by nebulization every 4 (four) hours as needed for wheezing or shortness of breath. 08/13/23   Fausto Burnard LABOR, DO  albuterol  (VENTOLIN  HFA) 108 (90 Base) MCG/ACT inhaler Inhale 2 puffs into the lungs every 4 (four) hours as needed for wheezing or shortness of breath. 10/02/22   Bernard Drivers, MD  arformoterol  (BROVANA ) 15 MCG/2ML NEBU Take 2 mLs (15 mcg total) by nebulization 2 (two) times daily. 09/10/23   Will Almarie MATSU, MD  atorvastatin  (LIPITOR) 10 MG tablet Take 1 tablet (10 mg total) by mouth at bedtime. 08/14/23   Fausto Burnard LABOR, DO  benztropine  (COGENTIN ) 2 MG tablet  Take 1 tablet (2 mg total) by mouth 2 (two) times daily. 07/30/23   Arfeen, Leni DASEN, MD  budesonide  (PULMICORT ) 0.25 MG/2ML nebulizer solution Take 2 mLs (0.25 mg total) by nebulization 2 (two) times daily. 08/14/23 09/28/23  Fausto Burnard A, DO  budesonide -glycopyrrolate -formoterol  (BREZTRI AEROSPHERE) 160-9-4.8 MCG/ACT AERO inhaler Inhale 1-2 puffs into the lungs 2 (two) times daily.    [provider]  carvedilol  (COREG ) 3.125 MG tablet Take 2 tablets (6.25 mg total) by mouth 2 (two) times daily with a meal. 09/10/23   Will Almarie MATSU, MD  cetirizine (ZYRTEC) 10 MG tablet Take 10 mg by mouth daily as needed for allergies or rhinitis.    [provider]  cholecalciferol (VITAMIN D) 25 MCG (1000 UNIT) tablet Take 5,000 Units by mouth every morning.    [provider]  famotidine  (PEPCID ) 40 MG tablet Take 40 mg by mouth 2 (two) times daily. 12/28/18   [provider]  feeding supplement, GLUCERNA SHAKE, (GLUCERNA SHAKE) LIQD Take 237 mLs by mouth 3 (three) times daily between meals. 08/14/23   Fausto Burnard LABOR, DO  ibuprofen  (ADVIL ) 400 MG tablet Take 1 tablet (400 mg total) by mouth every 6 (six) hours as needed (moderate to severe pain). 08/14/23   Fausto Burnard LABOR, DO  ipratropium-albuterol  (DUONEB) 0.5-2.5 (3) MG/3ML SOLN Take 3 mLs by nebulization every 4 (four) hours. 08/14/23   Fausto Burnard LABOR, DO  JANUVIA 100 MG tablet Take 100 mg by mouth daily. 01/30/23   [provider]  lidocaine  (LIDODERM ) 5 % Place 1 patch onto the skin daily. Remove & Discard patch within 12 hours or as directed by MD 09/10/23   Will Almarie MATSU, MD  methocarbamol  (ROBAXIN ) 750 MG tablet Take 1 tablet (750 mg total) by mouth every 8 (eight) hours as needed for muscle spasms. 09/10/23   Will Almarie MATSU, MD  mirtazapine  (REMERON ) 30 MG tablet Take 1 tablet (30 mg total) by mouth at bedtime as needed (insomnia). Patient taking differently: Take 30 mg by mouth at  bedtime. 07/30/23 07/29/24  Arfeen, Leni DASEN, MD  multivitamin (ONE-A-DAY MEN'S) TABS tablet Take 1 tablet by mouth daily.    [provider]  ondansetron  (ZOFRAN ) 4 MG tablet Take 1 tablet (4 mg total) by mouth every 6 (six) hours as needed for nausea. 09/10/23   Will Almarie MATSU, MD  OXYGEN  Inhale 2 L/min into the lungs continuous.     [provider]  polyethylene glycol (MIRALAX  / GLYCOLAX ) 17 g packet Take 17 g by mouth daily as needed for mild constipation. Patient not taking: Reported on 09/11/2023    [provider]  polyethylene glycol (MIRALAX  / GLYCOLAX ) 17 g packet Take 17 g by mouth daily. 09/10/23   Will Almarie MATSU, MD  risperiDONE  (RISPERDAL ) 3 MG tablet Take 1 tablet (3 mg total) by mouth 2 (two) times daily. 07/30/23   Arfeen, Leni DASEN, MD  sodium chloride  (OCEAN) 0.65 % SOLN nasal spray Place 1 spray into both nostrils as needed for congestion.    [provider]  tamsulosin  (FLOMAX ) 0.4 MG CAPS capsule Take 0.4 mg by mouth in the morning and at bedtime. 07/23/20   [provider]    Allergies: Patient has no known allergies.    Review of Systems  Unable to perform ROS: Severe respiratory distress  Respiratory:  Positive for shortness of breath.     Updated Vital Signs BP 139/81 (BP Location: Right Arm)   Pulse (!) 133   Temp 98.1 F (36.7 C) (Axillary)   Resp (!) 26   Ht 5' 6 (1.676 m)   Wt 57.6 kg   SpO2 100%   BMI 20.50 kg/m   Physical Exam Vitals and nursing note reviewed.  Constitutional:      General: He is in acute distress.     Appearance: He is well-developed. He is ill-appearing.     Comments: Severe respiratory distress, tachypnea, gasping for breath and tripoding on CPAP  HENT:     Head: Normocephalic and atraumatic.     Mouth/Throat:     Pharynx: No oropharyngeal exudate.  Eyes:     Conjunctiva/sclera: Conjunctivae normal.     Pupils: Pupils are equal, round, and reactive to light.  Neck:      Comments: No meningismus. Cardiovascular:     Rate and Rhythm: Regular rhythm. Tachycardia present.     Heart sounds: Normal heart sounds. No murmur heard. Pulmonary:     Effort: Respiratory distress present.     Breath sounds: Wheezing present.     Comments: Diminished, scattered expiratory wheezing Abdominal:     Palpations: Abdomen is soft.     Tenderness: There is no abdominal tenderness. There is no guarding or rebound.  Musculoskeletal:        General: No tenderness. Normal range of motion.     Cervical back: Normal range of motion and neck supple.  Skin:  General: Skin is warm.  Neurological:     Mental Status: He is alert and oriented to person, place, and time.     Cranial Nerves: No cranial nerve deficit.     Motor: No abnormal muscle tone.     Coordination: Coordination normal.     Comments:  5/5 strength throughout. CN 2-12 intact.Equal grip strength.   Psychiatric:        Behavior: Behavior normal.     (all labs ordered are listed, but only abnormal results are displayed) Labs Reviewed  CBC WITH DIFFERENTIAL/PLATELET - Abnormal; Notable for the following components:      Result Value   Hemoglobin 11.9 (*)    HCT 37.1 (*)    Eosinophils Absolute 0.7 (*)    All other components within normal limits  COMPREHENSIVE METABOLIC PANEL WITH GFR - Abnormal; Notable for the following components:   Sodium 133 (*)    Chloride 97 (*)    Glucose, Bld 215 (*)    BUN <5 (*)    Calcium  8.7 (*)    Albumin 2.7 (*)    All other components within normal limits  I-STAT ARTERIAL BLOOD GAS, ED - Abnormal; Notable for the following components:   pH, Arterial 7.324 (*)    pCO2 arterial 48.4 (*)    pO2, Arterial 136 (*)    Sodium 132 (*)    HCT 35.0 (*)    Hemoglobin 11.9 (*)    All other components within normal limits  BRAIN NATRIURETIC PEPTIDE  BLOOD GAS, ARTERIAL  C-REACTIVE PROTEIN  PROCALCITONIN  TROPONIN I (HIGH SENSITIVITY)  TROPONIN I (HIGH SENSITIVITY)     EKG: EKG Interpretation Date/Time:  Wednesday September 30 2023 04:49:23 EDT Ventricular Rate:  123 PR Interval:  141 QRS Duration:  84 QT Interval:  296 QTC Calculation: 424 R Axis:   95  Text Interpretation: Sinus tachycardia Right axis deviation Probable anteroseptal infarct, old (no P-waves found) Confirmed by Carita Senior [phone]) on 09/30/2023 4:53:34 AM  Radiology: [facility] Chest Portable 1 View Result Date: 09/30/2023 CLINICAL DATA:  SOB that started around 0300 when he woke up. Hx of COPD, EXAM: PORTABLE CHEST 1 VIEW COMPARISON:  6/225/25. FINDINGS: Stable cardiomediastinal contours. Severe changes of emphysema. No pleural effusion or interstitial edema. Persistent opacification within the medial aspect of the right lower lung corresponding to area of consolidative change as noted on 09/06/2023. Compared with the previous exam this is slightly improved in the interval. IMPRESSION: 1. Persistent opacification within the medial aspect of the right lower lung corresponding to area of consolidative change as noted on 09/06/2023. 2. Severe emphysema. Electronically Signed   By: Waddell Calk M.D.   On: 09/30/2023 05:15     .Critical Care  Performed by: Carita Senior, MD Authorized by: Carita Senior, MD   Critical care provider statement:    Critical care time (minutes):  45   Critical care time was exclusive of:  Separately billable procedures and treating other patients   Critical care was necessary to treat or prevent imminent or life-threatening deterioration of the following conditions:  Respiratory failure   Critical care was time spent personally by me on the following activities:  Development of treatment plan with patient or surrogate, discussions with consultants, evaluation of patient's response to treatment, examination of patient, ordering and review of laboratory studies, ordering and review of radiographic studies, ordering and performing treatments and interventions,  pulse oximetry, re-evaluation of patient's condition, review of old charts, blood draw for specimens and obtaining  history from patient or surrogate   I assumed direction of critical care for this patient from another provider in my specialty: no     Care discussed with: admitting provider      Medications Ordered in the ED  ipratropium (ATROVENT ) 0.02 % nebulizer solution (has no administration in time range)  lactated ringers  bolus 1,000 mL (has no administration in time range)  enoxaparin  (LOVENOX ) injection 40 mg (has no administration in time range)  albuterol  (PROVENTIL ) (2.5 MG/3ML) 0.083% nebulizer solution 10 mg (10 mg Nebulization Given 09/30/23 0445)  ipratropium (ATROVENT ) nebulizer solution 0.5 mg (0.5 mg Nebulization Given 09/30/23 0447)                                    Medical Decision Making Amount and/or Complexity of Data Reviewed Independent Historian: EMS Labs: ordered. Decision-making details documented in ED Course. Radiology: ordered and independent interpretation performed. Decision-making details documented in ED Course. ECG/medicine tests: ordered and independent interpretation performed. Decision-making details documented in ED Course.  Risk Prescription drug management. Decision regarding hospitalization.   Respiratory distress with history of COPD as well as lung cancer.  Placed on BiPAP on arrival.  Chest elevation.  Denies chest pain.  Patient is given bronchodilators, steroids, magnesium  by EMS.  Given that she remains on Lasix  continue BiPAP.  Check ABG.  Work of breathing improving on BiPAP.  Moving air better.  He is given bronchodilators.  ABG without significant CO2 retention.  Chest x-ray shows stable right-sided lung mass without effusion or pneumothorax.  Results reviewed and interpreted by me.  Remains tachycardic but improving with fluids.  Not febrile.  Low suspicion for pneumonia. Given his ongoing tachycardia and tachypnea however will  rule out pulmonary embolism.  Will need admission for acute hypoxic respiratory failure in the setting of COPD exacerbation. D/w Dr Shona.      Final diagnoses:  Acute respiratory failure with hypoxia [facility])  COPD exacerbation [facility])    ED Discharge Orders     None          Carita Senior, MD 09/30/23 [phone]

## 2023-09-30 NOTE — Progress Notes (Signed)
 Venous duplex lower ext  has been completed. Refer to [facility] under chart review to view preliminary results.   09/30/2023  4:40 PM Blake Mcknight, Blake Mcknight BIRCH

## 2023-09-30 NOTE — ED Triage Notes (Signed)
 Pt BIB EMS w/ SOB that started around 0300 when he woke up. Hx of COPD, attempted to do treatments at home w/ no improvement. O2 stats originally 70% on RA, CPAP applied by EMS w/ improvement to 82% in route. Pt arrived sating 32% on CPAP. RR 35.

## 2023-09-30 NOTE — H&P (Addendum)
 History and Physical    Patient: Blake Mcknight FMW:[id number] DOB: [birth year] DOA: 09/30/2023 DOS: the patient was seen and examined on 09/30/2023 PCP: [name], MD  Patient coming from: Home  Chief Complaint:  Chief Complaint  Patient presents with   Shortness of Breath   HPI: Blake Mcknight is a 68 y.o. male with medical history significant of COPD with bullous emphysema, acute on chronic hypoxemic respiratory failure baseline O2 2 L, new diagnosis of bronchogenic cancer and a duodenal mass-currently not a candidate for aggressive treatment, history of heavy tobacco abuse, hypertension, schizophrenia requiring patient to live at home with family, dyslipidemia, diabetes mellitus 2, BPH, anemia, hypertension.  Patient was recently discharged from inpatient treatment utilizing our hospital at home program on 6/26 and was in stable condition at that time.  He was discharged with Brovana  budesonide  and DuoNebs.  He had a similar hospitalization in May for COPD exacerbation and improved with initiation of steroids and antibiotics.  He was brought in by EMS for shortness of breath that started around 3 AM when he awakened.  He attempted to do nebulizer treatments at home without any improvement.  Upon EMS arrival room air sats were 70% CPAP was applied by EMS but sats only improved to 82%.  In the ED BiPAP was applied and sats improved to 98 to 100%.  Imaging revealed severe emphysema as well as persistent but stable opacification within the medial right lower lung corresponding to the area of consolidative change noted on 09/06/2023.  ABG revealed mild hypercarbia with a PCO2 of 48 with mild respiratory acidosis with a pH of 7.32.  PO2 was 136.  He was also given a 1 L lactated ringer  bolus by EDP.  Hospitalist service was consulted regarding admission.  Above history was confirmed with family at bedside.  In addition they have followed up with [facility] as well as palliative medicine.   No acute indications for continued palliative intervention but they will follow at a distance according to family.  Family does state that patient's symptoms were abrupt in onset and family member who assists with primary caretaking responsibilities states the patient was about 1 hour late or so on his usual scheduled every 4 hours DuoNeb and she feels this may have brought about current symptoms and presentation.  During my evaluation of the patient in the room BiPAP was removed and he has done well on 3 L of oxygen .  Initial bed request was for progressive given need for BiPAP but since he is doing well on 3 L can transition to medical telemetry.  Patient is a DNR/DNI.  Review of Systems: As mentioned in the history of present illness. All other systems reviewed and are negative.  Past Medical History:  Diagnosis Date   Abdominal pain 06/03/2013   Chest pain 08/10/2023   COPD (chronic obstructive pulmonary disease) (HCC)    with bullous emphysema.    COPD exacerbation (HCC) 08/09/2023   Heavy cigarette smoker before 2003   pt claims only 10 cigs per day, never heavier amounts.    HLD (hyperlipidemia)    Hypertension    Schizophrenia (HCC) 06/28/2013   This is a chronic condition and he lives with family   Past Surgical History:  Procedure Laterality Date   CHOLECYSTECTOMY N/A 06/06/2013   Procedure: LAPAROSCOPIC CHOLECYSTECTOMY WITH ATTEMPTED INTRAOPERATIVE CHOLANGIOGRAM;  Surgeon: Dann FORBES Hummer, MD;  Location: [facility] OR;  Service: General;  Laterality: N/A;   ERCP N/A 06/03/2013   Procedure: ENDOSCOPIC RETROGRADE  CHOLANGIOPANCREATOGRAPHY (ERCP);  Surgeon: Gwendlyn ONEIDA Buddy, MD;  Location: [facility] ENDOSCOPY;  Service: Endoscopy;  Laterality: N/A;   Social History:  reports that he has quit smoking. His smoking use included cigarettes. He has a 41 pack-year smoking history. He has never used smokeless tobacco. He reports that he does not drink alcohol and does not use drugs.  No Known  Allergies  Family History  Problem Relation Age of Onset   Diabetes Mellitus II Mother    Diabetes Mother    Heart disease Father    Stroke Maternal Aunt    CAD Neg Hx     Prior to Admission medications   Medication Sig Start Date End Date Taking? Authorizing Provider  acetaminophen  (TYLENOL ) 500 MG tablet Take 2 tablets (1,000 mg total) by mouth every 8 (eight) hours. 08/14/23   Fausto Burnard LABOR, DO  albuterol  (PROVENTIL ) (2.5 MG/3ML) 0.083% nebulizer solution Take 3 mLs (2.5 mg total) by nebulization every 4 (four) hours as needed for wheezing or shortness of breath. 08/13/23   Fausto Burnard LABOR, DO  albuterol  (VENTOLIN  HFA) 108 (90 Base) MCG/ACT inhaler Inhale 2 puffs into the lungs every 4 (four) hours as needed for wheezing or shortness of breath. 10/02/22   Bernard Drivers, MD  arformoterol  (BROVANA ) 15 MCG/2ML NEBU Take 2 mLs (15 mcg total) by nebulization 2 (two) times daily. 09/10/23   Will Almarie MATSU, MD  atorvastatin  (LIPITOR) 10 MG tablet Take 1 tablet (10 mg total) by mouth at bedtime. 08/14/23   Fausto Burnard A, DO  benztropine  (COGENTIN ) 2 MG tablet Take 1 tablet (2 mg total) by mouth 2 (two) times daily. 07/30/23   Arfeen, Leni ONEIDA, MD  budesonide  (PULMICORT ) 0.25 MG/2ML nebulizer solution Take 2 mLs (0.25 mg total) by nebulization 2 (two) times daily. 08/14/23 09/28/23  Fausto Burnard LABOR, DO  budesonide -glycopyrrolate -formoterol  (BREZTRI AEROSPHERE) 160-9-4.8 MCG/ACT AERO inhaler Inhale 1-2 puffs into the lungs 2 (two) times daily.    [provider]  carvedilol  (COREG ) 3.125 MG tablet Take 2 tablets (6.25 mg total) by mouth 2 (two) times daily with a meal. 09/10/23   Will Almarie MATSU, MD  cetirizine (ZYRTEC) 10 MG tablet Take 10 mg by mouth daily as needed for allergies or rhinitis.    [provider]  cholecalciferol (VITAMIN D) 25 MCG (1000 UNIT) tablet Take 5,000 Units by mouth every morning.    [provider]  famotidine  (PEPCID ) 40 MG tablet  Take 40 mg by mouth 2 (two) times daily. 12/28/18   [provider]  feeding supplement, GLUCERNA SHAKE, (GLUCERNA SHAKE) LIQD Take 237 mLs by mouth 3 (three) times daily between meals. 08/14/23   Fausto Burnard A, DO  ibuprofen  (ADVIL ) 400 MG tablet Take 1 tablet (400 mg total) by mouth every 6 (six) hours as needed (moderate to severe pain). 08/14/23   Fausto Burnard LABOR, DO  ipratropium-albuterol  (DUONEB) 0.5-2.5 (3) MG/3ML SOLN Take 3 mLs by nebulization every 4 (four) hours. 08/14/23   Fausto Burnard A, DO  JANUVIA 100 MG tablet Take 100 mg by mouth daily. 01/30/23   [provider]  lidocaine  (LIDODERM ) 5 % Place 1 patch onto the skin daily. Remove & Discard patch within 12 hours or as directed by MD 09/10/23   Will Almarie MATSU, MD  methocarbamol  (ROBAXIN ) 750 MG tablet Take 1 tablet (750 mg total) by mouth every 8 (eight) hours as needed for muscle spasms. 09/10/23   Will Almarie MATSU, MD  mirtazapine  (REMERON ) 30 MG tablet Take 1  tablet (30 mg total) by mouth at bedtime as needed (insomnia). Patient taking differently: Take 30 mg by mouth at bedtime. 07/30/23 07/29/24  Arfeen, Leni DASEN, MD  multivitamin (ONE-A-DAY MEN'S) TABS tablet Take 1 tablet by mouth daily.    [provider]  ondansetron  (ZOFRAN ) 4 MG tablet Take 1 tablet (4 mg total) by mouth every 6 (six) hours as needed for nausea. 09/10/23   Will Almarie MATSU, MD  OXYGEN  Inhale 2 L/min into the lungs continuous.     [provider]  polyethylene glycol (MIRALAX  / GLYCOLAX ) 17 g packet Take 17 g by mouth daily as needed for mild constipation. Patient not taking: Reported on 09/11/2023    [provider]  polyethylene glycol (MIRALAX  / GLYCOLAX ) 17 g packet Take 17 g by mouth daily. 09/10/23   Will Almarie MATSU, MD  risperiDONE  (RISPERDAL ) 3 MG tablet Take 1 tablet (3 mg total) by mouth 2 (two) times daily. 07/30/23   Arfeen, Leni DASEN, MD  sodium chloride  (OCEAN) 0.65 % SOLN nasal spray  Place 1 spray into both nostrils as needed for congestion.    [provider]  tamsulosin  (FLOMAX ) 0.4 MG CAPS capsule Take 0.4 mg by mouth in the morning and at bedtime. 07/23/20   [provider]    Physical Exam: Vitals:   09/30/23 0530 09/30/23 0545 09/30/23 0600 09/30/23 0615  BP: 100/79 111/79 108/74 116/85  Pulse: (!) 117 (!) 115 (!) 109 (!) 107  Resp: 19 19 19 18   Temp:      TempSrc:      SpO2: 100% 100% 100% 100%  Weight:      Height:       Constitutional: NAD, calm, comfortable Respiratory: clear to auscultation bilaterally, no wheezing, no crackles. Normal respiratory effort. No accessory muscle use.  BiPAP has been transitioned to 3 L oxygen  and tolerating well Cardiovascular: Regular rate and rhythm, no murmurs / rubs / gallops. No extremity edema. 2+ pedal pulses.   Abdomen: no tenderness, no masses palpated. No hepatosplenomegaly. Bowel sounds positive.  Musculoskeletal: no clubbing / cyanosis. No joint deformity upper and lower extremities. Good ROM, no contractures. Normal muscle tone.  Skin: no rashes, lesions, ulcers. No induration Neurologic: CN 2-12 grossly intact. Sensation intact, Strength 4/5 x all 4 extremities.  Psychiatric: Alert and oriented x name.  Pleasant affect and mood.     Data Reviewed:  Sodium 133, potassium 4.4, chloride 97, glucose 215, BUN less than 5, creatinine 0.68, LFTs are normal, albumin 2.7, total protein 6.8  BNP 28.4  Initial high-sensitivity troponin 13 with follow-up 23  CRP 4.3 with procalcitonin 0.24  WBC 9700 with normal differential, hemoglobin 11.9, platelets 396,000  Imaging as above  Assessment and Plan: Acute on chronic hypoxemic as well as hypercapnic respiratory failure secondary to COPD exacerbation in context of underlying lung cancer Symptoms have improved with initiation of DuoNebs, steroids, and utilization of BiPAP noting BiPAP has now been weaned to 3 L/min.  2 L/min his baseline. Continue  home Brovana , scheduled DuoNebs every 4 hours, resume budesonide  nebs every 12 hours Solu-Medrol  40 mg every 12 hours Follows with pulmonary medicine in the outpatient setting  Lung mass/lung cancer Right lower lobe and presumed to be NSCLC Not a candidate for EBUS; s/p radiation rx about 1 year ago-primarily followed by pulmonary [place] Has been evaluated by [facility] palliative medicine with no acute needs identified according to family Current imaging is stable, patient is afebrile and has stable productive cough at  baseline.  No clinical signs suggestive of obstructive pneumonia Procalcitonin 0.24, CRP slightly elevated at 4.7 and this is likely secondary to acute stressor from acute hypoxemia  Acute on chronic right side PE CT just resulted from earlier EDP order Moderate clot burden-submassive with right heart strain Start full dose anticoagulation with Eliquis  (discussed with Onco Mohamed) Likely underlying malignancy is etiology IVF hydration for 24 hrs given RHS and lower range BP readings Obtain B LE venous duplex  Schizophrenia Continue home regimen of medications of Cogentin , Risperdal  and Remeron   Diabetes mellitus 2 Follow CBGs and provide SSI May 2025 hemoglobin A1c was 5.9  Duodenal mass Located within the duodenal bulb Patient has been asymptomatic although at times does have abdominal pain If develop significant symptoms would need to follow-up with GI as appropriate  Hypertension Continue carvedilol   HLD Continue Lipitor  BPH Monitor for urinary retention   Advance Care Planning:   Code Status: Limited: Do not attempt resuscitation (DNR) -DNR-LIMITED -Do Not Intubate/DNI    VTE prophylaxis: Lovenox   Consults: None  Family Communication: Multiple family members at bedside  Severity of Illness: The appropriate patient status for this patient is OBSERVATION. Observation status is judged to be reasonable and necessary in order to provide the required  intensity of service to ensure the patient's safety. The patient's presenting symptoms, physical exam findings, and initial radiographic and laboratory data in the context of their medical condition is felt to place them at decreased risk for further clinical deterioration. Furthermore, it is anticipated that the patient will be medically stable for discharge from the hospital within 2 midnights of admission.   Author: Isaiah Lever, NP 09/30/2023 6:32 AM  For on call review [email or web address].

## 2023-09-30 NOTE — ED Notes (Signed)
 CCMD called and notified

## 2023-09-30 NOTE — Telephone Encounter (Signed)
 Patient Product/process development scientist completed.    The patient is insured through [employer]. Patient has Medicare and is not eligible for a copay card, but may be able to apply for patient assistance or Medicare RX Payment Plan (Patient Must reach out to their plan, if eligible for payment plan), if available.    Ran test claim for Eliquis 5 mg and the current 30 day co-pay is $4.80.   This test claim was processed through [facility]- copay amounts may vary at other pharmacies due to pharmacy/plan contracts, or as the patient moves through the different stages of their insurance plan.     Roland Earl, CPHT Pharmacy Technician III Certified Patient Advocate [facility] Patient Advocate Team Direct Number: [phone]  Fax: [phone]

## 2023-09-30 NOTE — Progress Notes (Signed)
 PHARMACY - ANTICOAGULATION CONSULT NOTE  Pharmacy Consult for Lovenox  > Eliquis  Indication: pulmonary embolus  No Known Allergies  Patient Measurements: Height: 5' 6 (167.6 cm) Weight: 57.6 kg (127 lb) IBW/kg (Calculated) : 63.8 HEPARIN DW (KG): 57.6  Vital Signs: Temp: 96.8 F (36 C) (07/16 1356) Temp Source: Axillary (07/16 1356) BP: 115/81 (07/16 1130) Pulse Rate: 100 (07/16 1130)  Labs: Recent Labs    09/30/23 0437 09/30/23 0508 09/30/23 0834  HGB 11.9* 11.9*  --   HCT 37.1* 35.0*  --   PLT 396  --   --   CREATININE 0.68  --   --   TROPONINIHS 13  --  23*    Estimated Creatinine Clearance: 75 mL/min (by C-G formula based on SCr of 0.68 mg/dL).   Medical History: Past Medical History:  Diagnosis Date   Abdominal pain 06/03/2013   Chest pain 08/10/2023   COPD (chronic obstructive pulmonary disease) (HCC)    with bullous emphysema.    COPD exacerbation (HCC) 08/09/2023   Heavy cigarette smoker before 2003   pt claims only 10 cigs per day, never heavier amounts.    HLD (hyperlipidemia)    Hypertension    Schizophrenia (HCC) 06/28/2013   This is a chronic condition and he lives with family     Assessment: 86 YOM presenting with SOB, CT angio with PE, pharmacy consulted to initiate lovenox  today and transition to Eliquis  tomorrow AM.  He is not on anticoagulation PTA however was administered 40mg  of lovenox  today as DVT ppx order  Goal of Therapy:  Anti-Xa level 0.6-1 units/ml 4hrs after LMWH dose given Monitor platelets by anticoagulation protocol: Yes   Plan:  Lovenox  30mg  SQ x 1 to complete treatment dose given timing of DVT ppx dose, then tomorrow AM start Eliquis  10mg  BID x 7d, then 5mg  BID thereafter - his co-pay has been checked and will be $4.80 Monitor renal function, s/s bleeding  Dorn Poot, PharmD, [facility] Clinical Pharmacist ED Pharmacist Phone # [phone] 09/30/2023 3:31 PM

## 2023-10-01 ENCOUNTER — Other Ambulatory Visit (HOSPITAL_COMMUNITY): Payer: Self-pay

## 2023-10-01 ENCOUNTER — Inpatient Hospital Stay (HOSPITAL_COMMUNITY)

## 2023-10-01 DIAGNOSIS — J9621 Acute and chronic respiratory failure with hypoxia: Secondary | ICD-10-CM | POA: Diagnosis not present

## 2023-10-01 DIAGNOSIS — I2609 Other pulmonary embolism with acute cor pulmonale: Secondary | ICD-10-CM | POA: Diagnosis not present

## 2023-10-01 LAB — CBG MONITORING, ED
Glucose-Capillary: 114 mg/dL — ABNORMAL HIGH (ref 70–99)
Glucose-Capillary: 108 mg/dL — ABNORMAL HIGH (ref 70–99)
Glucose-Capillary: 135 mg/dL — ABNORMAL HIGH (ref 70–99)
Glucose-Capillary: 124 mg/dL — ABNORMAL HIGH (ref 70–99)

## 2023-10-01 LAB — BASIC METABOLIC PANEL WITH GFR
Anion gap: 11 (ref 5–15)
BUN: <5 mg/dL — ABNORMAL LOW (ref 8–23)
CO2: 24 mmol/L (ref 22–32)
Calcium: 8.4 mg/dL — ABNORMAL LOW (ref 8.9–10.3)
Chloride: 101 mmol/L (ref 98–111)
Creatinine, Ser: 0.54 mg/dL — ABNORMAL LOW (ref 0.61–1.24)
GFR, Estimated: >60 mL/min (ref >60)
Glucose, Bld: 123 mg/dL — ABNORMAL HIGH (ref 70–99)
Potassium: 4 mmol/L (ref 3.5–5.1)
Sodium: 136 mmol/L (ref 135–145)

## 2023-10-01 LAB — ECHOCARDIOGRAM COMPLETE
AR max vel: 3.75 cm2
AV Area VTI: 3.47 cm2
AV Area mean vel: 3.5 cm2
AV Mean grad: 2.5 mmHg
AV Peak grad: 4.5 mmHg
Ao pk vel: 1.06 m/s
Area-P 1/2: 4.89 cm2
Height: 66 in
S' Lateral: 2.55 cm
Weight: 2032.01 [oz_av]

## 2023-10-01 LAB — CBC WITH DIFFERENTIAL/PLATELET
Abs Immature Granulocytes: 0.03 K/uL (ref 0.00–0.07)
Basophils Absolute: 0 K/uL (ref 0.0–0.1)
Basophils Relative: 0 %
Eosinophils Absolute: 0 K/uL (ref 0.0–0.5)
Eosinophils Relative: 0 %
HCT: 37.3 % — ABNORMAL LOW (ref 39.0–52.0)
Hemoglobin: 11.8 g/dL — ABNORMAL LOW (ref 13.0–17.0)
Immature Granulocytes: 0 %
Lymphocytes Relative: 13 %
Lymphs Abs: 0.9 K/uL (ref 0.7–4.0)
MCH: 27.1 pg (ref 26.0–34.0)
MCHC: 31.6 g/dL (ref 30.0–36.0)
MCV: 85.7 fL (ref 80.0–100.0)
Monocytes Absolute: 0.4 K/uL (ref 0.1–1.0)
Monocytes Relative: 6 %
Neutro Abs: 5.5 K/uL (ref 1.7–7.7)
Neutrophils Relative %: 81 %
Platelets: 319 K/uL (ref 150–400)
RBC: 4.35 MIL/uL (ref 4.22–5.81)
RDW: 15.3 % (ref 11.5–15.5)
WBC: 6.8 K/uL (ref 4.0–10.5)
nRBC: 0 % (ref 0.0–0.2)

## 2023-10-01 MED ORDER — APIXABAN (ELIQUIS) VTE STARTER PACK (10MG AND 5MG)
ORAL_TABLET | ORAL | 0 refills | Status: DC
Start: 2023-10-01 — End: 2023-10-12
  Filled 2023-10-01: qty 74, 30d supply, fill #0

## 2023-10-01 MED ORDER — PREDNISONE 50 MG PO TABS
50.0000 mg | ORAL_TABLET | Freq: Every day | ORAL | 0 refills | Status: AC
  Filled 2023-10-01: qty 4, 4d supply, fill #0

## 2023-10-01 NOTE — ED Notes (Signed)
 Called echo for update on reading

## 2023-10-01 NOTE — ED Notes (Addendum)
 C/o SOB, RT notified and will administer breathing tx

## 2023-10-01 NOTE — Evaluation (Signed)
 Physical Therapy Evaluation Patient Details Name: Blake Mcknight MRN: [id number] DOB: August 22, 1955 Today's Date: 10/01/2023  History of Present Illness  Pt is 68 yo presenting to [facility] ED on 7/16 due to respiratory distress. Recent admission for same. PMH: COPD, chronic respiratory failure on 2L home O2, hx lunc CA s/p SBRT, HTN, HLD, schizophrenia and DM II.  Clinical Impression  Pt is presenting slightly below baseline level of functioning. Currently pt is CGA for bed mobility, sit to stand and gait without an AD. Pt would benefit from UE support with AD but may not accept AD due to unfamiliarity. Pt currently requires CGA to avoid LOB with gait and initially upon sit to stand. Balance did improve with distance during gait but pt became very short of breathe despite O2 sats remaining in the upper 90's and HR around 115 bpm (70 bpm at rest). Due to pt current functional status, home set up and available assistance at home recommending skilled physical therapy services 3x/week in order to address strength, balance and functional mobility to decrease risk for falls, injury and re-hospitalization.           If plan is discharge home, recommend the following: A little help with walking and/or transfers;Assistance with cooking/housework;Assist for transportation;Help with stairs or ramp for entrance;Supervision due to cognitive status     Equipment Recommendations Rolling walker (2 wheels)     Functional Status Assessment Patient has had a recent decline in their functional status and demonstrates the ability to make significant improvements in function in a reasonable and predictable amount of time.     Precautions / Restrictions Precautions Precautions: Fall;Other (comment) Recall of Precautions/Restrictions: Intact Precaution/Restrictions Comments: monitor O2/dyspnea. Age indeterminant compression T7 Fx. Restrictions Weight Bearing Restrictions Per Provider Order: No      Mobility  Bed  Mobility Overal bed mobility: Needs Assistance Bed Mobility: Supine to Sit, Sit to Supine     Supine to sit: Supervision Sit to supine: Supervision   General bed mobility comments: Required cues to facilitate. Able to rise without physical help.    Transfers Overall transfer level: Needs assistance Equipment used: None Transfers: Sit to/from Stand Sit to Stand: Contact guard assist           General transfer comment: poor balance upon standing. CGA for safety and to assist with balance.    Ambulation/Gait Ambulation/Gait assistance: Contact guard assist Gait Distance (Feet): 60 Feet Assistive device: None Gait Pattern/deviations: Step-through pattern, Decreased stride length, Drifts right/left, Narrow base of support Gait velocity: decreased Gait velocity interpretation: <1.8 ft/sec, indicate of risk for recurrent falls   General Gait Details: Cues for wider BOS due to narrow BOS, drifting towards right. Able to self correct but needs cues for safety, awareness, and CGA for safety. Pt very fatigued with short distance gait and short of breathe O2 sats upper 90's throughout and HR up to 115 bpm.     Balance Overall balance assessment: Needs assistance Sitting-balance support: Single extremity supported, Feet unsupported Sitting balance-Leahy Scale: Fair     Standing balance support: No upper extremity supported, During functional activity Standing balance-Leahy Scale: Poor Standing balance comment: no overt LOB CGA for balance due to unsteady initially with gait which did improve slightly no overt LOB       Pertinent Vitals/Pain Pain Assessment Pain Assessment: Faces Faces Pain Scale: Hurts a little bit Pain Location: low back Pain Descriptors / Indicators: Discomfort Pain Intervention(s): Monitored during session    Home Living Family/patient expects to  be discharged to:: Private residence Living Arrangements: Other relatives;Other (Comment) (lives in sisters  home) Available Help at Discharge: Family;Available 24 hours/day Type of Home: House Home Access: Stairs to enter Entrance Stairs-Rails: None Entrance Stairs-Number of Steps: 2   Home Layout: One level Home Equipment: Wheelchair - Lawyer Comments: lives with sister. Rarely home alone    Prior Function Prior Level of Function : Needs assist;Patient poor historian/Family not available             Mobility Comments: Per previoushospitalization: no AD for mobility, states sister will sometimes hold his hand when walking. Was getting HHPT PTA. ADLs Comments: Per previous hospitalization: Per brother present, pt requires intermittent assist with ADLs, assisted with all IADLs including med mgmt     Extremity/Trunk Assessment   Upper Extremity Assessment Upper Extremity Assessment: Defer to OT evaluation    Lower Extremity Assessment Lower Extremity Assessment: Generalized weakness    Cervical / Trunk Assessment Cervical / Trunk Assessment: Normal  Communication   Communication Communication: Impaired Factors Affecting Communication: Difficulty expressing self    Cognition Arousal: Alert Behavior During Therapy: WFL for tasks assessed/performed   PT - Cognitive impairments: No family/caregiver present to determine baseline, Orientation, Attention, Awareness, Memory, Problem solving   Orientation impairments: Time, Situation       PT - Cognition Comments: oriented to self and location - Reports year as 2445 and unsure as to why he is admitted to hospital. Following commands: Impaired Following commands impaired: Follows multi-step commands inconsistently, Follows multi-step commands with increased time     Cueing Cueing Techniques: Verbal cues, Gestural cues            Assessment/Plan    PT Assessment Patient needs continued PT services  PT Problem List Decreased strength;Decreased activity tolerance;Decreased balance;Decreased  mobility;Decreased cognition;Decreased knowledge of use of DME;Cardiopulmonary status limiting activity;Pain       PT Treatment Interventions DME instruction;Gait training;Stair training;Functional mobility training;Therapeutic activities;Therapeutic exercise;Balance training;Neuromuscular re-education;Patient/family education;Cognitive remediation    PT Goals (Current goals can be found in the Care Plan section)  Acute Rehab PT Goals PT Goal Formulation: Patient unable to participate in goal setting Time For Goal Achievement: 10/15/23 Potential to Achieve Goals: Fair    Frequency Min 2X/week        AM-PAC PT 6 Clicks Mobility  Outcome Measure Help needed turning from your back to your side while in a flat bed without using bedrails?: None Help needed moving from lying on your back to sitting on the side of a flat bed without using bedrails?: A Little Help needed moving to and from a bed to a chair (including a wheelchair)?: A Little Help needed standing up from a chair using your arms (e.g., wheelchair or bedside chair)?: A Little Help needed to walk in hospital room?: A Little Help needed climbing 3-5 steps with a railing? : A Little 6 Click Score: 19    End of Session Equipment Utilized During Treatment: Oxygen ;Gait belt Activity Tolerance: Patient tolerated treatment well;Patient limited by fatigue Patient left: in bed;with call bell/phone within reach Nurse Communication: Mobility status PT Visit Diagnosis: Unsteadiness on feet (R26.81);Other abnormalities of gait and mobility (R26.89);Muscle weakness (generalized) (M62.81);Other symptoms and signs involving the nervous system (R29.898)    Time: 9181-9161 PT Time Calculation (min) (ACUTE ONLY): 20 min   Charges:   PT Evaluation $PT Eval Low Complexity: 1 Low   PT General Charges $$ ACUTE PT VISIT: 1 Visit        Dorothyann  Sallyann, DPT, CLT  Acute Rehabilitation Services Office: [phone] (Secure chat  preferred)   Dorothyann VEAR Sallyann 10/01/2023, 8:38 AM

## 2023-10-01 NOTE — Discharge Summary (Signed)
 Physician Discharge Summary  Blake Mcknight:[id number] DOB: 1955-05-02 DOA: 09/30/2023  PCP: [name], MD  Admit date: 09/30/2023 Discharge date: 10/01/2023 30 Day Unplanned Readmission Risk Score    Flowsheet Row ED to Hosp-Admission (Current) from 09/30/2023 in [facility] Emergency Department at [facility]  30 Day Unplanned Readmission Risk Score (%) 31.63 Filed at 10/01/2023 1200    This score is the patient's risk of an unplanned readmission within 30 days of being discharged (0 -100%). The score is based on dignosis, age, lab data, medications, orders, and past utilization.   Low:  0-14.9   Medium: 15-21.9   High: 22-29.9   Extreme: 30 and above          Admitted From: Home Disposition: Home  Recommendations for Outpatient Follow-up:  Follow up with PCP in 1-2 weeks Please obtain BMP/CBC in one week Please follow up with your PCP on the following pending results: Unresulted Labs (From admission, onward)     Start     Ordered   09/30/23 0437  Blood gas, arterial  Once,   R        09/30/23 0437              Home Health: Yes Equipment/Devices: None  Discharge Condition:  stable CODE STATUS: DNR Diet recommendation: Cardiac  Due to brief hospitalization, I have copied HPI as below.  HPI: Blake Mcknight is a 68 y.o. male with medical history significant of COPD with bullous emphysema, acute on chronic hypoxemic respiratory failure baseline O2 2 L, new diagnosis of bronchogenic cancer and a duodenal mass-currently not a candidate for aggressive treatment, history of heavy tobacco abuse, hypertension, schizophrenia requiring patient to live at home with family, dyslipidemia, diabetes mellitus 2, BPH, anemia, hypertension.  Patient was recently discharged from inpatient treatment utilizing our hospital at home program on 6/26 and was in stable condition at that time.  He was discharged with Brovana  budesonide  and DuoNebs.  He had a similar  hospitalization in May for COPD exacerbation and improved with initiation of steroids and antibiotics.  He was brought in by EMS for shortness of breath that started around 3 AM when he awakened.  He attempted to do nebulizer treatments at home without any improvement.  Upon EMS arrival room air sats were 70% CPAP was applied by EMS but sats only improved to 82%.  In the ED BiPAP was applied and sats improved to 98 to 100%.  Imaging revealed severe emphysema as well as persistent but stable opacification within the medial right lower lung corresponding to the area of consolidative change noted on 09/06/2023.  ABG revealed mild hypercarbia with a PCO2 of 48 with mild respiratory acidosis with a pH of 7.32.  PO2 was 136.  He was also given a 1 L lactated ringer  bolus by EDP.  Hospitalist service was consulted regarding admission.   Above history was confirmed with family at bedside.  In addition they have followed up with [facility] as well as palliative medicine.  No acute indications for continued palliative intervention but they will follow at a distance according to family.  Family does state that patient's symptoms were abrupt in onset and family member who assists with primary caretaking responsibilities states the patient was about 1 hour late or so on his usual scheduled every 4 hours DuoNeb and she feels this may have brought about current symptoms and presentation.  During my evaluation of the patient in the room BiPAP was removed and he  has done well on 3 L of oxygen .  Initial bed request was for progressive given need for BiPAP but since he is doing well on 3 L can transition to medical telemetry.  Patient is a DNR/DNI.  Subjective: Seen and examined this morning, nurse at the bedside, patient had no complaint, he denied any shortness of breath.   Brief/Interim Summary: Patient was initially admitted with acute on chronic hypoxic as well as hypercapnic respiratory failure which was presumed to be  secondary to COPD exacerbation in the context of underlying lung cancer for which patient was started on DuoNebs, steroids as well as rest of medications for COPD exacerbation.  However  soon after admission, CT angiogram was completed which showed acute on chronic right-sided PE with moderate clot burden/submassive with right heart strain.  Patient was then started on Lovenox .  We consulted with patient's primary oncologist Dr. Sherrod who cleared the patient to be transitioned to Eliquis .  We continued him on Lovenox  yesterday and transitioned him to Eliquis  this morning.  Echo was completed which does not show any heart strain.  Patient is asymptomatic and he has been weaned down to 2 L of oxygen  which is his baseline.  Doppler lower extremity was negative for DVT.  He remained stable and thus he is going to be discharged home on Eliquis  as well as prednisone  for 4 days to complete course for COPD exacerbation.  Resuming all other home medications.  Discharge plan was discussed with patient and/or family member/patient's sister and they verbalized understanding and agreed with it.  Discharge Diagnoses:  Principal Problem:   Acute on chronic hypoxic respiratory failure (HCC) Active Problems:   Acute respiratory failure with hypoxia (HCC)   Acute pulmonary embolism (HCC)    Discharge Instructions   Allergies as of 10/01/2023   No Known Allergies      Medication List     TAKE these medications    acetaminophen  500 MG tablet Commonly known as: TYLENOL  Take 2 tablets (1,000 mg total) by mouth every 8 (eight) hours.   albuterol  108 (90 Base) MCG/ACT inhaler Commonly known as: VENTOLIN  HFA Inhale 2 puffs into the lungs every 4 (four) hours as needed for wheezing or shortness of breath.   albuterol  (2.5 MG/3ML) 0.083% nebulizer solution Commonly known as: PROVENTIL  Take 3 mLs (2.5 mg total) by nebulization every 4 (four) hours as needed for wheezing or shortness of breath.    arformoterol  15 MCG/2ML Nebu Commonly known as: BROVANA  Take 2 mLs (15 mcg total) by nebulization 2 (two) times daily.   atorvastatin  10 MG tablet Commonly known as: LIPITOR Take 1 tablet (10 mg total) by mouth at bedtime.   benztropine  2 MG tablet Commonly known as: COGENTIN  Take 1 tablet (2 mg total) by mouth 2 (two) times daily.   Breztri Aerosphere 160-9-4.8 MCG/ACT Aero inhaler Generic drug: budesonide -glycopyrrolate -formoterol  Inhale 1-2 puffs into the lungs 2 (two) times daily.   budesonide  0.25 MG/2ML nebulizer solution Commonly known as: PULMICORT  Take 2 mLs (0.25 mg total) by nebulization 2 (two) times daily.   carvedilol  3.125 MG tablet Commonly known as: COREG  Take 2 tablets (6.25 mg total) by mouth 2 (two) times daily with a meal.   cetirizine 10 MG tablet Commonly known as: ZYRTEC Take 10 mg by mouth daily as needed for allergies or rhinitis.   cholecalciferol 25 MCG (1000 UNIT) tablet Commonly known as: VITAMIN D3 Take 5,000 Units by mouth every morning.   Eliquis  DVT/PE Starter Pack Generic drug: Apixaban  Starter  Pack (10mg  and 5mg ) Take as directed on package: start with two-5mg  tablets twice daily for 7 days. On day 8, switch to one-5mg  tablet twice daily.   famotidine  40 MG tablet Commonly known as: PEPCID  Take 40 mg by mouth 2 (two) times daily.   feeding supplement (GLUCERNA SHAKE) Liqd Take 237 mLs by mouth 3 (three) times daily between meals.   ibuprofen  400 MG tablet Commonly known as: ADVIL  Take 1 tablet (400 mg total) by mouth every 6 (six) hours as needed (moderate to severe pain).   ipratropium-albuterol  0.5-2.5 (3) MG/3ML Soln Commonly known as: DUONEB Take 3 mLs by nebulization every 4 (four) hours.   Januvia 100 MG tablet Generic drug: sitaGLIPtin Take 100 mg by mouth daily.   lidocaine  5 % Commonly known as: LIDODERM  Place 1 patch onto the skin daily. Remove & Discard patch within 12 hours or as directed by MD    methocarbamol  750 MG tablet Commonly known as: ROBAXIN  Take 1 tablet (750 mg total) by mouth every 8 (eight) hours as needed for muscle spasms.   mirtazapine  30 MG tablet Commonly known as: REMERON  Take 1 tablet (30 mg total) by mouth at bedtime as needed (insomnia). What changed: when to take this   multivitamin Tabs tablet Take 1 tablet by mouth daily.   ondansetron  4 MG tablet Commonly known as: ZOFRAN  Take 1 tablet (4 mg total) by mouth every 6 (six) hours as needed for nausea.   OXYGEN  Inhale 2 L/min into the lungs continuous.   polyethylene glycol 17 g packet Commonly known as: MIRALAX  / GLYCOLAX  Take 17 g by mouth daily.   predniSONE  50 MG tablet Commonly known as: DELTASONE  Take 1 tablet (50 mg total) by mouth daily with breakfast for 4 days.   risperiDONE  3 MG tablet Commonly known as: RISPERDAL  Take 1 tablet (3 mg total) by mouth 2 (two) times daily.   sodium chloride  0.65 % Soln nasal spray Commonly known as: OCEAN Place 1 spray into both nostrils as needed for congestion.   tamsulosin  0.4 MG Caps capsule Commonly known as: FLOMAX  Take 0.4 mg by mouth in the morning and at bedtime.        Follow-up Information     [name], MD Follow up in 1 week(s).   Specialty: Pulmonary Disease Contact information: [address] [place] [address] [phone]         Care, [facility] Follow up.   Why: Home Health Contact information: [address] [place] [address] [phone]                No Known Allergies  Consultations: None   Procedures/Studies: ECHOCARDIOGRAM COMPLETE Result Date: 10/01/2023    ECHOCARDIOGRAM REPORT   Patient Name:   Blake Mcknight Date of Exam: 10/01/2023 Medical Rec #:  [id number]           Height:       66.0 in Accession #:    [id number]          Weight:       127.0 lb Date of Birth:  1955-08-04            BSA:          1.649 m Patient Age:    67 years            BP:            154/84 mmHg Patient Gender: M  HR:           84 bpm. Exam Location:  Inpatient Procedure: 2D Echo, Color Doppler and Cardiac Doppler (Both Spectral and Color            Flow Doppler were utilized during procedure). Indications:    Pulmonary embolus  History:        Patient has no prior history of Echocardiogram examinations.                 Risk Factors:Hypertension.  Sonographer:    Benard Stallion Referring Phys: [id number] Lianah Peed IMPRESSIONS  1. Left ventricular ejection fraction, by estimation, is 70 to 75%. The left ventricle has hyperdynamic function. The left ventricle has no regional wall motion abnormalities. Left ventricular diastolic parameters were normal.  2. Right ventricular systolic function is normal. The right ventricular size is normal.  3. The mitral valve is normal in structure. No evidence of mitral valve regurgitation. No evidence of mitral stenosis.  4. The aortic valve is normal in structure. Aortic valve regurgitation is not visualized. No aortic stenosis is present.  5. The inferior vena cava is normal in size with greater than 50% respiratory variability, suggesting right atrial pressure of 3 mmHg. FINDINGS  Left Ventricle: Left ventricular ejection fraction, by estimation, is 70 to 75%. The left ventricle has hyperdynamic function. The left ventricle has no regional wall motion abnormalities. The left ventricular internal cavity size was normal in size. There is no left ventricular hypertrophy. Left ventricular diastolic parameters were normal. Right Ventricle: The right ventricular size is normal. No increase in right ventricular wall thickness. Right ventricular systolic function is normal. Left Atrium: Left atrial size was normal in size. Right Atrium: Right atrial size was normal in size. Pericardium: Trivial pericardial effusion is present. Mitral Valve: The mitral valve is normal in structure. No evidence of mitral valve regurgitation. No evidence of  mitral valve stenosis. Tricuspid Valve: The tricuspid valve is normal in structure. Tricuspid valve regurgitation is not demonstrated. No evidence of tricuspid stenosis. Aortic Valve: The aortic valve is normal in structure. Aortic valve regurgitation is not visualized. No aortic stenosis is present. Aortic valve mean gradient measures 2.5 mmHg. Aortic valve peak gradient measures 4.5 mmHg. Aortic valve area, by VTI measures 3.47 cm. Pulmonic Valve: The pulmonic valve was normal in structure. Pulmonic valve regurgitation is not visualized. No evidence of pulmonic stenosis. Aorta: The aortic root is normal in size and structure. Venous: The inferior vena cava is normal in size with greater than 50% respiratory variability, suggesting right atrial pressure of 3 mmHg. IAS/Shunts: The interatrial septum was not well visualized.  LEFT VENTRICLE PLAX 2D LVIDd:         4.35 cm   Diastology LVIDs:         2.55 cm   LV e' medial:    7.62 cm/s LV PW:         0.80 cm   LV E/e' medial:  12.7 LV IVS:        0.80 cm   LV e' lateral:   8.24 cm/s LVOT diam:     2.20 cm   LV E/e' lateral: 11.7 LV SV:         66 LV SV Index:   40 LVOT Area:     3.80 cm  RIGHT VENTRICLE RV Basal diam:  3.20 cm RV Mid diam:    3.00 cm RV S prime:     20.00 cm/s TAPSE (M-mode): 2.0 cm LEFT ATRIUM  Index        RIGHT ATRIUM           Index LA diam:        3.20 cm 1.94 cm/m   RA Area:     10.20 cm LA Vol (A2C):   29.9 ml 18.13 ml/m  RA Volume:   20.20 ml  12.25 ml/m LA Vol (A4C):   17.2 ml 10.43 ml/m LA Biplane Vol: 23.3 ml 14.13 ml/m  AORTIC VALVE AV Area (Vmax):    3.75 cm AV Area (Vmean):   3.50 cm AV Area (VTI):     3.47 cm AV Vmax:           105.50 cm/s AV Vmean:          71.200 cm/s AV VTI:            0.190 m AV Peak Grad:      4.5 mmHg AV Mean Grad:      2.5 mmHg LVOT Vmax:         104.00 cm/s LVOT Vmean:        65.600 cm/s LVOT VTI:          0.173 m LVOT/AV VTI ratio: 0.91  AORTA Ao Root diam: 2.90 cm MITRAL VALVE MV Area  (PHT): 4.89 cm     SHUNTS MV Decel Time: 155 msec     Systemic VTI:  0.17 m MV E velocity: 96.70 cm/s   Systemic Diam: 2.20 cm MV A velocity: 101.00 cm/s MV E/A ratio:  0.96 Morene Brownie Electronically signed by Morene Brownie Signature Date/Time: 10/01/2023/3:07:23 PM    Final    VAS US  LOWER EXTREMITY VENOUS (DVT) Result Date: 09/30/2023  Lower Venous DVT Study Patient Name:  CLANCE BAQUERO  Date of Exam:   09/30/2023 Medical Rec #: [id number]            Accession #:    [id number] Date of Birth: [birth year]             Patient Gender: M Patient Age:   77 years Exam Location:  [facility] Procedure:      VAS US  LOWER EXTREMITY VENOUS (DVT) Referring Phys: ISAIAH LEVER --------------------------------------------------------------------------------  Indications: Pulmonary embolism, and SOB.  Comparison Study: No priors. Performing Technologist: Ricka Sturdivant-Jones RDMS, RVT  Examination Guidelines: A complete evaluation includes B-mode imaging, spectral Doppler, color Doppler, and power Doppler as needed of all accessible portions of each vessel. Bilateral testing is considered an integral part of a complete examination. Limited examinations for reoccurring indications may be performed as noted. The reflux portion of the exam is performed with the patient in reverse Trendelenburg.  +---------+---------------+---------+-----------+----------+--------------+ RIGHT    CompressibilityPhasicitySpontaneityPropertiesThrombus Aging +---------+---------------+---------+-----------+----------+--------------+ CFV      Full           Yes      Yes                                 +---------+---------------+---------+-----------+----------+--------------+ SFJ      Full                                                        +---------+---------------+---------+-----------+----------+--------------+ FV Prox  Full                                                         +---------+---------------+---------+-----------+----------+--------------+  FV Mid   Full                                                        +---------+---------------+---------+-----------+----------+--------------+ FV DistalFull                                                        +---------+---------------+---------+-----------+----------+--------------+ PFV      Full                                                        +---------+---------------+---------+-----------+----------+--------------+ POP      Full           Yes      Yes                                 +---------+---------------+---------+-----------+----------+--------------+ PTV      Full                                                        +---------+---------------+---------+-----------+----------+--------------+ PERO     Full                                                        +---------+---------------+---------+-----------+----------+--------------+   +---------+---------------+---------+-----------+----------+--------------+ LEFT     CompressibilityPhasicitySpontaneityPropertiesThrombus Aging +---------+---------------+---------+-----------+----------+--------------+ CFV      Full           Yes      Yes                                 +---------+---------------+---------+-----------+----------+--------------+ SFJ      Full                                                        +---------+---------------+---------+-----------+----------+--------------+ FV Prox  Full                                                        +---------+---------------+---------+-----------+----------+--------------+ FV Mid   Full                                                        +---------+---------------+---------+-----------+----------+--------------+  FV DistalFull                                                         +---------+---------------+---------+-----------+----------+--------------+ PFV      Full                                                        +---------+---------------+---------+-----------+----------+--------------+ POP      Full           Yes      Yes                                 +---------+---------------+---------+-----------+----------+--------------+ PTV      Full                                                        +---------+---------------+---------+-----------+----------+--------------+ PERO     Full                                                        +---------+---------------+---------+-----------+----------+--------------+     Summary: BILATERAL: - No evidence of deep vein thrombosis seen in the lower extremities, bilaterally. -No evidence of popliteal cyst, bilaterally.   *See table(s) above for measurements and observations. Electronically signed by Gaile New MD on 09/30/2023 at 5:10:29 PM.    Final    CT Angio Chest PE W and/or Wo Contrast Addendum Date: 09/30/2023 ADDENDUM REPORT: 09/30/2023 12:35 ADDENDUM: The original report was by Dr. Ryan Salvage. The following addendum is by Dr. Ryan Salvage: Critical Value/emergent results were called by telephone at the time of interpretation on 09/30/2023 at 12:34 pm to provider Dr. Scott Zackowski, who verbally acknowledged these results. Electronically Signed   By: Ryan Salvage M.D.   On: 09/30/2023 12:35   Result Date: 09/30/2023 CLINICAL DATA:  Shortness of breath starting at 3 a.m. COPD. Right lower lobe lung cancer. * Tracking Code: BO * EXAM: CT ANGIOGRAPHY CHEST WITH CONTRAST TECHNIQUE: Multidetector CT imaging of the chest was performed using the standard protocol during bolus administration of intravenous contrast. Multiplanar CT image reconstructions and MIPs were obtained to evaluate the vascular anatomy. RADIATION DOSE REDUCTION: This exam was performed according to the departmental  dose-optimization program which includes automated exposure control, adjustment of the mA and/or kV according to patient size and/or use of iterative reconstruction technique. CONTRAST:  75mL OMNIPAQUE  IOHEXOL  350 MG/ML SOLN COMPARISON:  09/04/2023 FINDINGS: Cardiovascular: Acute on chronic right lower lobe pulmonary embolus with peripheral thrombus causing narrowing of the right lower lobe pulmonary artery on image 75 series 5, and more central filling defect in medial branches of the right lower lobe pulmonary artery for example on image 82 series 5. Overall clot burden is moderate although the acute clot burden is mild.  This pulmonary embolus was not present on 09/04/2023. Right ventricular to left ventricular ratio 1.18 which is elevated. Coronary, aortic arch, and branch vessel atherosclerotic vascular disease. Small anterior pericardial effusion. Mediastinum/Nodes: Right hilar node 1.3 cm in short axis on image 61 series 5, stable. Subcarinal adenopathy 2.7 cm in short axis on image 69 series 5, stable. Lungs/Pleura: Severe primarily paraseptal emphysema. Masslike consolidative findings in the right lower lobe similar to 09/04/2023 and raising concern for underlying lung mass or malignancy particularly given the persistent adenopathy. Trace right pleural effusion. Peripheral pulmonary calcifications in the posterior basal segments of both lower lobes. Airway thickening and plugging in both lower lobes nodularity in the left lower lobe may be the result of varicoid bronchiectasis for example posteriorly on image 102 series 6, and is not substantially changed from prior, with true pulmonary nodules being a possible but less likely differential diagnostic consideration. Stable regions of nodularity with bandlike components anteriorly in the left upper lobe for example on images 72-79 of series 6. Upper Abdomen: Questionable 8 mm pancreatic cystic lesion on image 90 of series 8, roughly similar on prior exams for the  last 3 years. This could be a tiny indolent intraductal papillary mucinous neoplasm or a small postinflammatory cystic lesion. Consider surveillance in the context of the patient's planned chest CT surveillance imaging. Musculoskeletal: Stable vertebra plana at T7. Review of the MIP images confirms the above findings. IMPRESSION: 1. Acute on chronic (but all new compared to 09/04/2023) morphology right lower lobe pulmonary embolus with peripheral thrombus causing narrowing of the right lower lobe pulmonary artery and more central filling defect in medial branches of the right lower lobe pulmonary artery. Overall clot burden is moderate although the acute clot burden is mild. This pulmonary embolus was not present on 09/04/2023. 2. Positive for acute PE with CT evidence of right heart strain (RV/LV Ratio = 1.18) consistent with at least submassive (intermediate risk) PE. The presence of right heart strain has been associated with an increased risk of morbidity and mortality. Please refer to the Code PE Focused order set in EPIC. 3. Masslike consolidative findings in the right lower lobe similar to 09/04/2023 and suspicious for underlying lung mass or malignancy particularly given the persistent adenopathy. 4. Trace right pleural effusion. 5. Airway thickening and plugging in both lower lobes. 6. Stable regions of nodularity with bandlike components anteriorly in the left upper lobe. 7. Questionable 8 mm pancreatic cystic lesion, roughly similar on prior exams for the last 3 years. This could be a tiny indolent intraductal papillary mucinous neoplasm or a small postinflammatory cystic lesion. Consider surveillance in the context of the patient's planned chest CT surveillance imaging. 8. Stable vertebra plana at T7. 9. Aortic Atherosclerosis (ICD10-I70.0) and Emphysema (ICD10-J43.9). Electronically Signed: By: Ryan Salvage M.D. On: 09/30/2023 12:21   DG Chest Portable 1 View Result Date: 09/30/2023 CLINICAL  DATA:  SOB that started around 0300 when he woke up. Hx of COPD, EXAM: PORTABLE CHEST 1 VIEW COMPARISON:  6/225/25. FINDINGS: Stable cardiomediastinal contours. Severe changes of emphysema. No pleural effusion or interstitial edema. Persistent opacification within the medial aspect of the right lower lung corresponding to area of consolidative change as noted on 09/06/2023. Compared with the previous exam this is slightly improved in the interval. IMPRESSION: 1. Persistent opacification within the medial aspect of the right lower lung corresponding to area of consolidative change as noted on 09/06/2023. 2. Severe emphysema. Electronically Signed   By: Waddell Joan HERO.D.  On: 09/30/2023 05:15   DG Chest Port 1 View Result Date: 09/06/2023 EXAM: 1 VIEW XRAY OF THE CHEST 09/06/2023 07:10:00 AM COMPARISON: 2-view chest x-ray 09/03/2023 and CT angio of chest 09/04/2023. CLINICAL HISTORY: SOB FINDINGS: LUNGS AND PLEURA: Severe emphysematous changes are again noted. Mass-like consolidation in the posterior right lower lobe is similar to the prior studies. Right greater than left pleural effusions are stable. HEART AND MEDIASTINUM: No acute abnormality of the cardiac and mediastinal silhouettes. BONES AND SOFT TISSUES: No acute osseous abnormality. IMPRESSION: 1. Mass-like consolidation in the posterior right lower lobe, similar to prior studies. 2. Right greater than left pleural effusions, stable. 3. Severe emphysematous changes. Electronically signed by: Lonni Necessary MD 09/06/2023 07:23 AM EDT RP Workstation: [id number]   CT Angio Chest PE W and/or Wo Contrast Result Date: 09/04/2023 CLINICAL DATA:  68 year old male with abdomen pain, known posterior right lower lung mass partially visible on CT Abdomen and Pelvis yesterday. EXAM: CT ANGIOGRAPHY CHEST WITH CONTRAST TECHNIQUE: Multidetector CT imaging of the chest was performed using the standard protocol during bolus administration of intravenous contrast.  Multiplanar CT image reconstructions and MIPs were obtained to evaluate the vascular anatomy. RADIATION DOSE REDUCTION: This exam was performed according to the departmental dose-optimization program which includes automated exposure control, adjustment of the mA and/or kV according to patient size and/or use of iterative reconstruction technique. CONTRAST:  75mL OMNIPAQUE  IOHEXOL  350 MG/ML SOLN COMPARISON:  CT Abdomen and Pelvis yesterday. CTA chest 08/09/2023 and earlier. FINDINGS: Cardiovascular: Excellent contrast bolus timing in the pulmonary arterial tree. Mild mixing artifact in the main pulmonary arteries. No pulmonary artery thrombus or filling defect identified. Calcified aortic atherosclerosis. Calcified coronary artery atherosclerosis. Heart size remains normal. No pericardial effusion. Mediastinum/Nodes: Mildly enlarged subcarinal soft tissue mass since 08/09/2023 (up to 2.9 cm short axis now, 2.6 cm previously) appears to be lymphadenopathy or ex nodal disease eccentric to the right. Other right hilar lymph node tissue up to 13 mm short axis is stable on series 4, image 72. No contralateral left hilar lymphadenopathy. No superior mediastinal lymphadenopathy. Lungs/Pleura: Severe emphysema. Extensive bullous disease and architectural distortion bilaterally. Confluent secretions along the right lateral wall of the trachea, retained secretions in the bilateral mainstem bronchi. Ongoing right lower lobe masslike peribronchial opacity, consolidative opacity. This measures up to 6.3 cm long axis, size and configuration appears stable since 08/09/2023. No significant pleural effusion. No new pulmonary abnormality identified. Upper Abdomen: Stable visible noncontrast upper abdominal viscera including cholecystectomy. No upper abdominal free air or free fluid. Musculoskeletal: Osteopenia. T7 compression fracture was present last month but has per progressed to vertebra plana now (series 8, image 92). No  significant retropulsion. No destructive osseous lesion identified. Other vertebral height maintained. No acute osseous abnormality identified. Review of the MIP images confirms the above findings. IMPRESSION: 1. Negative for acute pulmonary embolus. 2. Masslike peribronchial consolidation in the right lower lobe not significantly changed since 08/09/2023 but mildly enlarged subcarinal lymphadenopathy and/or ex nodal mass since that time, now up to 2.9 cm short axis. Constellation highly suspicious for bronchogenic carcinoma with nodal metastases. Severe underlying Emphysema (ICD10-J43.9). Recommend referral to [facility]) if not already done. 3. Osteopenia. T7 compression fracture last month has progressed to vertebra plana. No obvious skeletal metastasis by CT. 4.  Aortic Atherosclerosis (ICD10-I70.0). Electronically Signed   By: VEAR Hurst M.D.   On: 09/04/2023 06:00   CT ABDOMEN PELVIS W CONTRAST Result Date: 09/03/2023 CLINICAL DATA:  Abdominal cramping for  2 weeks. EXAM: CT ABDOMEN AND PELVIS WITH CONTRAST TECHNIQUE: Multidetector CT imaging of the abdomen and pelvis was performed using the standard protocol following bolus administration of intravenous contrast. RADIATION DOSE REDUCTION: This exam was performed according to the departmental dose-optimization program which includes automated exposure control, adjustment of the mA and/or kV according to patient size and/or use of iterative reconstruction technique. CONTRAST:  75mL OMNIPAQUE  IOHEXOL  350 MG/ML SOLN COMPARISON:  Aug 10, 2023 FINDINGS: Lower chest: Extensive emphysematous lung disease is seen within the bilateral lung bases with a stable masslike area seen within the lower right lung. A small right pleural effusion is also present. Hepatobiliary: There is diffuse fatty infiltration of the liver. A 2.3 cm x 1.8 cm area of low attenuation and thin peripheral rim of vascularity is seen within the right lobe of  the liver (Segment VI). Status post cholecystectomy. No biliary dilatation. Pancreas: Unremarkable. No pancreatic ductal dilatation or surrounding inflammatory changes. Spleen: Normal in size without focal abnormality. Adrenals/Urinary Tract: Adrenal glands are unremarkable. Kidneys are normal in size, without renal calculi or hydronephrosis. Several bilateral simple renal cysts are seen. Bladder is unremarkable. Stomach/Bowel: Stomach is within normal limits. Appendix appears normal. A 1.8 cm well-defined, intraluminal, mildly hyperdense area (approximately 153.23 Hounsfield units) is seen along the wall of the duodenal bulb (axial CT image 22, CT series 2/coronal reformatted images 58 through 61, CT series 5). No evidence of bowel dilatation or inflammatory changes. Noninflamed diverticula are seen throughout the descending and sigmoid colon. Vascular/Lymphatic: Aortic atherosclerosis. No enlarged abdominal or pelvic lymph nodes. Reproductive: The prostate gland is mildly enlarged. Other: No abdominal wall hernia or abnormality. No abdominopelvic ascites. Musculoskeletal: No acute or significant osseous findings. IMPRESSION: 1. Extensive emphysematous lung disease with a stable mass-like area within the lower right lung. 2. Small right pleural effusion. 3. Findings likely consistent with a hemangioma within the right lobe of the liver. Correlation with nonemergent hepatic ultrasound is recommended. 4. Findings which may represent a soft tissue mass within the duodenal bulb, as described above. GI consultation and subsequent endoscopies recommended to further exclude the presence of an underlying neoplastic process. 5. Colonic diverticulosis. 6. Bilateral simple renal cysts. No follow-up imaging is recommended. This recommendation follows ACR consensus guidelines: Management of the Incidental Renal Mass on CT: A White Paper of the ACR Incidental Findings Committee. J Am Coll Radiol [id number]. 7. Aortic  atherosclerosis. Electronically Signed   By: Suzen Dials M.D.   On: 09/03/2023 20:02   DG Chest 2 View Result Date: 09/03/2023 CLINICAL DATA:  Cough, COPD EXAM: CHEST - 2 VIEW COMPARISON:  08/09/2023, CT 08/18/2023 FINDINGS: Severe emphysema. Stable consolidation within the posteromedial right lower lobe. Small right pleural effusion has developed. Left lung is clear. No pneumothorax. No pleural effusion on the left. Cardiac size within normal limits. No acute bone abnormality. IMPRESSION: 1. Interval development of a small right pleural effusion. 2. Stable right lower lobe consolidation. Electronically Signed   By: Dorethia Molt M.D.   On: 09/03/2023 19:42     Discharge Exam: Vitals:   10/01/23 1209 10/01/23 1300  BP: (!) 166/90 (!) 168/91  Pulse: 88   Resp: 18 15  Temp:  98 F (36.7 C)  SpO2: 100%    Vitals:   10/01/23 0900 10/01/23 1000 10/01/23 1209 10/01/23 1300  BP: (!) 153/84 (!) 148/83 (!) 166/90 (!) 168/91  Pulse: 69  88   Resp: 19 17 18 15   Temp:    98 F (36.7 C)  TempSrc:      SpO2: 100%  100%   Weight:      Height:        General: Pt is alert, awake, not in acute distress Cardiovascular: RRR, S1/S2 +, no rubs, no gallops Respiratory: CTA bilaterally, no wheezing, no rhonchi Abdominal: Soft, NT, ND, bowel sounds + Extremities: no edema, no cyanosis    The results of significant diagnostics from this hospitalization (including imaging, microbiology, ancillary and laboratory) are listed below for reference.     Microbiology: No results found for this or any previous visit (from the past 240 hours).   Labs: BNP (last 3 results) Recent Labs    09/06/23 0643 09/30/23 0437  BNP 28.2 28.4   Basic Metabolic Panel: Recent Labs  Lab 09/30/23 0437 09/30/23 0508 10/01/23 0421  NA 133* 132* 136  K 4.4 3.9 4.0  CL 97*  --  101  CO2 23  --  24  GLUCOSE 215*  --  123*  BUN <5*  --  <5*  CREATININE 0.68  --  0.54*  CALCIUM  8.7*  --  8.4*   Liver  Function Tests: Recent Labs  Lab 09/30/23 0437  AST 16  ALT 13  ALKPHOS 69  BILITOT 0.6  PROT 6.8  ALBUMIN 2.7*   No results for input(s): LIPASE, AMYLASE in the last 168 hours. No results for input(s): AMMONIA in the last 168 hours. CBC: Recent Labs  Lab 09/30/23 0437 09/30/23 0508 10/01/23 0421  WBC 9.7  --  6.8  NEUTROABS 4.8  --  5.5  HGB 11.9* 11.9* 11.8*  HCT 37.1* 35.0* 37.3*  MCV 86.1  --  85.7  PLT 396  --  319   Cardiac Enzymes: No results for input(s): CKTOTAL, CKMB, CKMBINDEX, TROPONINI in the last 168 hours. BNP: Invalid input(s): POCBNP CBG: Recent Labs  Lab 09/30/23 1206 09/30/23 1704 09/30/23 2206 10/01/23 0744 10/01/23 1247  GLUCAP 130* 133* 114* 108* 135*   D-Dimer No results for input(s): DDIMER in the last 72 hours. Hgb A1c No results for input(s): HGBA1C in the last 72 hours. Lipid Profile No results for input(s): CHOL, HDL, LDLCALC, TRIG, CHOLHDL, LDLDIRECT in the last 72 hours. Thyroid function studies No results for input(s): TSH, T4TOTAL, T3FREE, THYROIDAB in the last 72 hours.  Invalid input(s): FREET3 Anemia work up No results for input(s): VITAMINB12, FOLATE, FERRITIN, TIBC, IRON, RETICCTPCT in the last 72 hours. Urinalysis    Component Value Date/Time   COLORURINE YELLOW 04/16/2023 0616   APPEARANCEUR CLEAR 04/16/2023 0616   LABSPEC 1.016 04/16/2023 0616   PHURINE 6.0 04/16/2023 0616   GLUCOSEU 150 (A) 04/16/2023 0616   HGBUR NEGATIVE 04/16/2023 0616   BILIRUBINUR NEGATIVE 04/16/2023 0616   KETONESUR NEGATIVE 04/16/2023 0616   PROTEINUR NEGATIVE 04/16/2023 0616   UROBILINOGEN 0.2 01/29/2019 1634   NITRITE NEGATIVE 04/16/2023 0616   LEUKOCYTESUR NEGATIVE 04/16/2023 0616   Sepsis Labs Recent Labs  Lab 09/30/23 0437 10/01/23 0421  WBC 9.7 6.8   Microbiology No results found for this or any previous visit (from the past 240 hours).  FURTHER DISCHARGE  INSTRUCTIONS:   Get Medicines reviewed and adjusted: Please take all your medications with you for your next visit with your Primary MD   Laboratory/radiological data: Please request your Primary MD to go over all hospital tests and procedure/radiological results at the follow up, please ask your Primary MD to get all Hospital records sent to his/her office.   In some cases, they will be blood  work, cultures and biopsy results pending at the time of your discharge. Please request that your primary care M.D. goes through all the records of your hospital data and follows up on these results.   Also Note the following: If you experience worsening of your admission symptoms, develop shortness of breath, life threatening emergency, suicidal or homicidal thoughts you must seek medical attention immediately by calling 911 or calling your MD immediately  if symptoms less severe.   You must read complete instructions/literature along with all the possible adverse reactions/side effects for all the Medicines you take and that have been prescribed to you. Take any new Medicines after you have completely understood and accpet all the possible adverse reactions/side effects.    patient was instructed, not to drive, operate heavy machinery, perform activities at heights, swimming or participation in water activities or provide baby-sitting services while on Pain, Sleep and Anxiety Medications; until their outpatient Physician has advised to do so again. Also recommended to not to take more than prescribed Pain, Sleep and Anxiety Medications.  It is not advisable to combine anxiety, sleep and pain medications without talking with your primary care provider.     Wear Seat belts while driving.   Please note: You were cared for by a hospitalist during your hospital stay. Once you are discharged, your primary care physician will handle any further medical issues. Please note that NO REFILLS for any discharge  medications will be authorized once you are discharged, as it is imperative that you return to your primary care physician (or establish a relationship with a primary care physician if you do not have one) for your post hospital discharge needs so that they can reassess your need for medications and monitor your lab values  Time coordinating discharge: Over 30 minutes  SIGNED:   Fredia Skeeter, MD  [facility] 10/01/2023, 3:33 PM *Please note that this is a verbal dictation therefore any spelling or grammatical errors are due to the Dragon Medical One system interpretation. If 7PM-7AM, please contact night-coverage [email or web address]

## 2023-10-01 NOTE — Discharge Planning (Signed)
 Pt currently active with [facility] for Home Health services RN and PT as confirmed by [facility] with [place] of [facility].  Pt will resume HH services. No DME needs identified at this time.

## 2023-10-01 NOTE — ED Notes (Signed)
 Patient  and family provided with discharge paperwork. Pt and family demonstrated understanding of material. All questions, comments, and concerns addressed. Pt assisted into wheelchair and into family's car.

## 2023-10-01 NOTE — Evaluation (Signed)
 Occupational Therapy Evaluation Patient Details Name: Blake Mcknight MRN: [id number] DOB: 1955-12-22 Today's Date: 10/01/2023   History of Present Illness   Pt is 68 yo presenting to [facility] ED on 7/16 due to respiratory distress. Recent admission for same. PMH: COPD, chronic respiratory failure on 2L home O2, hx lunc CA s/p SBRT, HTN, HLD, schizophrenia and DM II.     Clinical Impressions Pt admitted for above, he is a poor historian at baseline, A&Ox3 today. Per prior notes he had intermittent assist with ADLs while family assists with all iADLs and he typically ambulates no AD. Pt currently presenting with impaired balance from baseline, needing CGA to safely ambulate, had one LOB needing ext assist to maintain standing bal. Pt demonstrating poor ability to sequence and organize, no family present to determine if that is his baseline. He currently needs min A to CGA for ADLs. OT to continue following pt acutely to address listed deficits and help transition to next level of care. Recommend pt continue with [facility] services at DC.      If plan is discharge home, recommend the following:   A little help with bathing/dressing/bathroom;Assistance with cooking/housework;Direct supervision/assist for medications management;Direct supervision/assist for financial management;Assist for transportation;Supervision due to cognitive status     Functional Status Assessment   Patient has had a recent decline in their functional status and demonstrates the ability to make significant improvements in function in a reasonable and predictable amount of time.     Equipment Recommendations   Other (comment) (pt may have went home with [facility] last admit, unsure given poor historian state.)     Recommendations for Other Services         Precautions/Restrictions   Precautions Precautions: Fall;Other (comment) Recall of Precautions/Restrictions: Intact Precaution/Restrictions Comments: monitor  O2/dyspnea. Age indeterminant compression T7 Fx. Restrictions Weight Bearing Restrictions Per Provider Order: No     Mobility Bed Mobility Overal bed mobility: Needs Assistance Bed Mobility: Supine to Sit, Sit to Supine     Supine to sit: Supervision Sit to supine: Supervision   General bed mobility comments: Required cues to facilitate. Increased cues needed to encourage pt get to EOB    Transfers Overall transfer level: Needs assistance Equipment used: None Transfers: Sit to/from Stand Sit to Stand: Contact guard assist                  Balance Overall balance assessment: Needs assistance Sitting-balance support: Single extremity supported, Feet unsupported Sitting balance-Leahy Scale: Fair     Standing balance support: No upper extremity supported, During functional activity Standing balance-Leahy Scale: Poor Standing balance comment: 1 mild LOB needing min A, short distance ambulation with CGA no AD                           ADL either performed or assessed with clinical judgement   ADL Overall ADL's : Needs assistance/impaired Eating/Feeding: Independent;Bed level   Grooming: Standing;Oral care;Contact guard assist;Cueing for sequencing Grooming Details (indicate cue type and reason): Pt needing cues to apply toothpaste to toothbrush Upper Body Bathing: Sitting;Contact guard assist   Lower Body Bathing: Sit to/from stand;Minimal assistance   Upper Body Dressing : Sitting;Minimal assistance   Lower Body Dressing: Sit to/from stand;Minimal assistance   Toilet Transfer: Contact guard assist;Ambulation   Toileting- Clothing Manipulation and Hygiene: Sit to/from stand;Contact guard assist;Sitting/lateral lean       Functional mobility during ADLs: Contact guard assist General ADL Comments: ambulates no  AD, 1 LOB needing min A to maintain bal     Vision Ability to See in Adequate Light: 0 Adequate Patient Visual Report: No change from  baseline Vision Assessment?: No apparent visual deficits     Perception         Praxis         Pertinent Vitals/Pain Pain Assessment Pain Assessment: Faces Faces Pain Scale: Hurts a little bit Pain Location: low back Pain Descriptors / Indicators: Discomfort Pain Intervention(s): Monitored during session     Extremity/Trunk Assessment Upper Extremity Assessment Upper Extremity Assessment: Overall WFL for tasks assessed   Lower Extremity Assessment Lower Extremity Assessment: Generalized weakness   Cervical / Trunk Assessment Cervical / Trunk Assessment: Normal   Communication Communication Communication: Impaired Factors Affecting Communication: Difficulty expressing self   Cognition Arousal: Alert Behavior During Therapy: WFL for tasks assessed/performed Cognition: History of cognitive impairments             OT - Cognition Comments: hx of dementia, impaired ability to sequence and organize. A&Ox3, stated the month is January but got the year correct.                 Following commands: Impaired Following commands impaired: Follows multi-step commands with increased time, Follows one step commands with increased time     Cueing  General Comments   Cueing Techniques: Verbal cues;Gestural cues  SP02 100% on 2L at rest, 92% RA while ambulating and at rest. C/o mild SOB without supplemental 02. Returned to 2L at end of session.   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 Rarely home alone. Pt poor historian-stated he lives home alone      Prior Functioning/Environment Prior Level of Function : Needs assist;Patient poor historian/Family not available             Mobility Comments: Per previous hospitalization: no AD for mobility, states sister will sometimes hold his hand when walking. Was getting HHPT PTA. ADLs Comments: Per previous hospitalization: Per brother present, pt requires intermittent assist with ADLs, assisted with all IADLs including med mgmt    OT Problem List: Impaired balance (sitting and/or standing);Decreased strength;Decreased cognition;Cardiopulmonary status limiting activity   OT Treatment/Interventions: Self-care/ADL training;Therapeutic exercise;Energy conservation;DME and/or AE instruction;Therapeutic activities;Balance training;Patient/family education      OT Goals(Current goals can be found in the care plan section)   Acute Rehab OT Goals OT Goal Formulation: Patient unable to participate in goal setting Time For Goal Achievement: 10/15/23 Potential to Achieve Goals: Good ADL Goals Pt Will Perform Grooming: with supervision;standing Pt Will Perform Lower Body Bathing: with supervision;sit to/from stand Pt Will Perform Lower Body Dressing: with supervision;sit to/from stand Pt Will Transfer to Toilet: with supervision;ambulating (+ LRAD) Additional ADL Goal #1: Pt will demonstrate use of energy conservation strategies with min cueing during functional ADLs/mobility   OT Frequency:  Min 2X/week    Co-evaluation              AM-PAC OT 6 Clicks Daily Activity  Outcome Measure Help from another person eating meals?: None Help from another person taking care of personal grooming?: A Little Help from another person toileting, which includes using toliet, bedpan, or urinal?: A Little Help from another person bathing (including washing, rinsing,  drying)?: A Little Help from another person to put on and taking off regular upper body clothing?: A Little Help from another person to put on and taking off regular lower body clothing?: A Little 6 Click Score: 19   End of Session Equipment Utilized During Treatment: Gait belt;Oxygen  Nurse Communication: Mobility status  Activity Tolerance: Patient tolerated treatment well Patient left: in bed;with call bell/phone within reach  OT Visit Diagnosis: Unsteadiness on feet (R26.81);Other abnormalities of gait and mobility (R26.89);Muscle weakness (generalized) (M62.81)                Time: 8965-8951 OT Time Calculation (min): 14 min Charges:  OT General Charges $OT Visit: 1 Visit OT Evaluation $OT Eval Moderate Complexity: 1 Mod  10/01/2023  AB, OTR/L  Acute Rehabilitation Services  Office: [phone]   Curtistine JONETTA Das 10/01/2023, 12:17 PM

## 2023-10-01 NOTE — Care Management Obs Status (Signed)
 MEDICARE OBSERVATION STATUS NOTIFICATION   Patient Details  Name: Blake Mcknight MRN: [id number] Date of Birth: 03-01-1956   Medicare Observation Status Notification Given:  Yes    Coryn Mosso, RN 10/01/2023, 4:36 PM

## 2023-10-01 NOTE — Care Management Obs Status (Signed)
 MEDICARE OBSERVATION STATUS NOTIFICATION   Patient Details  Name: TORON BOWRING MRN: [id number] Date of Birth: 03-01-1956   Medicare Observation Status Notification Given:  Yes    Coryn Mosso, RN 10/01/2023, 4:36 PM

## 2023-10-01 NOTE — ED Notes (Signed)
 Patient refused to wear cardiac monitor, bp, or pulse ox

## 2023-10-01 NOTE — Care Management CC44 (Signed)
 Condition Code 44 Documentation Completed  Patient Details  Name: PHILL STECK MRN: [id number] Date of Birth: [birth year]   Condition Code 44 given:  Yes Patient signature on Condition Code 44 notice:  Yes Documentation of 2 MD's agreement:  Yes Code 44 added to claim:  Yes    Alban Marucci, RN 10/01/2023, 4:36 PM

## 2023-10-01 NOTE — Progress Notes (Signed)
*  PRELIMINARY RESULTS* Echocardiogram 2D Echocardiogram has been performed.  Benard FORBES Stallion 10/01/2023, 12:50 PM

## 2023-10-05 DIAGNOSIS — E1165 Type 2 diabetes mellitus with hyperglycemia: Secondary | ICD-10-CM | POA: Diagnosis not present

## 2023-10-05 DIAGNOSIS — J441 Chronic obstructive pulmonary disease with (acute) exacerbation: Secondary | ICD-10-CM | POA: Diagnosis not present

## 2023-10-05 DIAGNOSIS — J9621 Acute and chronic respiratory failure with hypoxia: Secondary | ICD-10-CM | POA: Diagnosis not present

## 2023-10-05 DIAGNOSIS — S22068D Other fracture of T7-T8 thoracic vertebra, subsequent encounter for fracture with routine healing: Secondary | ICD-10-CM | POA: Diagnosis not present

## 2023-10-05 DIAGNOSIS — C3431 Malignant neoplasm of lower lobe, right bronchus or lung: Secondary | ICD-10-CM | POA: Diagnosis not present

## 2023-10-05 DIAGNOSIS — I119 Hypertensive heart disease without heart failure: Secondary | ICD-10-CM | POA: Diagnosis not present

## 2023-10-06 ENCOUNTER — Inpatient Hospital Stay: Admitting: Primary Care

## 2023-10-06 ENCOUNTER — Encounter: Payer: Self-pay | Admitting: Primary Care

## 2023-10-06 DIAGNOSIS — C3431 Malignant neoplasm of lower lobe, right bronchus or lung: Secondary | ICD-10-CM | POA: Diagnosis not present

## 2023-10-06 DIAGNOSIS — S22068D Other fracture of T7-T8 thoracic vertebra, subsequent encounter for fracture with routine healing: Secondary | ICD-10-CM | POA: Diagnosis not present

## 2023-10-06 DIAGNOSIS — J441 Chronic obstructive pulmonary disease with (acute) exacerbation: Secondary | ICD-10-CM | POA: Diagnosis not present

## 2023-10-06 DIAGNOSIS — I119 Hypertensive heart disease without heart failure: Secondary | ICD-10-CM | POA: Diagnosis not present

## 2023-10-06 DIAGNOSIS — E1165 Type 2 diabetes mellitus with hyperglycemia: Secondary | ICD-10-CM | POA: Diagnosis not present

## 2023-10-06 DIAGNOSIS — J9621 Acute and chronic respiratory failure with hypoxia: Secondary | ICD-10-CM | POA: Diagnosis not present

## 2023-10-08 ENCOUNTER — Encounter (HOSPITAL_COMMUNITY): Payer: Self-pay | Admitting: Internal Medicine

## 2023-10-08 ENCOUNTER — Emergency Department (HOSPITAL_COMMUNITY)

## 2023-10-08 ENCOUNTER — Other Ambulatory Visit: Payer: Self-pay

## 2023-10-08 ENCOUNTER — Inpatient Hospital Stay (HOSPITAL_COMMUNITY)
Admission: EM | Admit: 2023-10-08 | Discharge: 2023-10-12 | DRG: 189 | Disposition: A | Discharge status: Home / Self Care | Attending: Internal Medicine | Admitting: Internal Medicine

## 2023-10-08 DIAGNOSIS — Z8249 Family history of ischemic heart disease and other diseases of the circulatory system: Secondary | ICD-10-CM

## 2023-10-08 DIAGNOSIS — E785 Hyperlipidemia, unspecified: Secondary | ICD-10-CM | POA: Diagnosis not present

## 2023-10-08 DIAGNOSIS — R062 Wheezing: Secondary | ICD-10-CM | POA: Diagnosis not present

## 2023-10-08 DIAGNOSIS — Z7901 Long term (current) use of anticoagulants: Secondary | ICD-10-CM | POA: Diagnosis not present

## 2023-10-08 DIAGNOSIS — J441 Chronic obstructive pulmonary disease with (acute) exacerbation: Principal | ICD-10-CM | POA: Diagnosis present

## 2023-10-08 DIAGNOSIS — Z7951 Long term (current) use of inhaled steroids: Secondary | ICD-10-CM | POA: Diagnosis not present

## 2023-10-08 DIAGNOSIS — N400 Enlarged prostate without lower urinary tract symptoms: Secondary | ICD-10-CM | POA: Diagnosis present

## 2023-10-08 DIAGNOSIS — R0602 Shortness of breath: Secondary | ICD-10-CM | POA: Diagnosis not present

## 2023-10-08 DIAGNOSIS — K319 Disease of stomach and duodenum, unspecified: Secondary | ICD-10-CM | POA: Diagnosis not present

## 2023-10-08 DIAGNOSIS — Z87891 Personal history of nicotine dependence: Secondary | ICD-10-CM

## 2023-10-08 DIAGNOSIS — Z86711 Personal history of pulmonary embolism: Secondary | ICD-10-CM | POA: Diagnosis not present

## 2023-10-08 DIAGNOSIS — I10 Essential (primary) hypertension: Secondary | ICD-10-CM | POA: Diagnosis not present

## 2023-10-08 DIAGNOSIS — Z79899 Other long term (current) drug therapy: Secondary | ICD-10-CM | POA: Diagnosis not present

## 2023-10-08 DIAGNOSIS — Z833 Family history of diabetes mellitus: Secondary | ICD-10-CM

## 2023-10-08 DIAGNOSIS — E119 Type 2 diabetes mellitus without complications: Secondary | ICD-10-CM | POA: Diagnosis not present

## 2023-10-08 DIAGNOSIS — Z923 Personal history of irradiation: Secondary | ICD-10-CM | POA: Diagnosis not present

## 2023-10-08 DIAGNOSIS — E8809 Other disorders of plasma-protein metabolism, not elsewhere classified: Secondary | ICD-10-CM | POA: Diagnosis present

## 2023-10-08 DIAGNOSIS — C3431 Malignant neoplasm of lower lobe, right bronchus or lung: Secondary | ICD-10-CM | POA: Diagnosis present

## 2023-10-08 DIAGNOSIS — R230 Cyanosis: Secondary | ICD-10-CM | POA: Diagnosis not present

## 2023-10-08 DIAGNOSIS — D649 Anemia, unspecified: Secondary | ICD-10-CM | POA: Diagnosis present

## 2023-10-08 DIAGNOSIS — F209 Schizophrenia, unspecified: Secondary | ICD-10-CM | POA: Diagnosis present

## 2023-10-08 DIAGNOSIS — F203 Undifferentiated schizophrenia: Secondary | ICD-10-CM | POA: Diagnosis not present

## 2023-10-08 DIAGNOSIS — J9621 Acute and chronic respiratory failure with hypoxia: Secondary | ICD-10-CM | POA: Diagnosis present

## 2023-10-08 DIAGNOSIS — Z66 Do not resuscitate: Secondary | ICD-10-CM | POA: Diagnosis present

## 2023-10-08 DIAGNOSIS — Z823 Family history of stroke: Secondary | ICD-10-CM

## 2023-10-08 DIAGNOSIS — K3189 Other diseases of stomach and duodenum: Secondary | ICD-10-CM | POA: Diagnosis not present

## 2023-10-08 DIAGNOSIS — R0603 Acute respiratory distress: Secondary | ICD-10-CM | POA: Diagnosis not present

## 2023-10-08 DIAGNOSIS — Z7984 Long term (current) use of oral hypoglycemic drugs: Secondary | ICD-10-CM

## 2023-10-08 DIAGNOSIS — R918 Other nonspecific abnormal finding of lung field: Secondary | ICD-10-CM | POA: Diagnosis not present

## 2023-10-08 DIAGNOSIS — R000 Tachycardia, unspecified: Secondary | ICD-10-CM | POA: Diagnosis not present

## 2023-10-08 DIAGNOSIS — R0689 Other abnormalities of breathing: Secondary | ICD-10-CM | POA: Diagnosis not present

## 2023-10-08 DIAGNOSIS — Z85118 Personal history of other malignant neoplasm of bronchus and lung: Secondary | ICD-10-CM

## 2023-10-08 DIAGNOSIS — D72829 Elevated white blood cell count, unspecified: Secondary | ICD-10-CM | POA: Diagnosis not present

## 2023-10-08 DIAGNOSIS — J439 Emphysema, unspecified: Secondary | ICD-10-CM | POA: Diagnosis present

## 2023-10-08 LAB — COMPREHENSIVE METABOLIC PANEL WITH GFR
ALT: 15 U/L (ref 0–44)
AST: 15 U/L (ref 15–41)
Albumin: 2.6 g/dL — ABNORMAL LOW (ref 3.5–5.0)
Alkaline Phosphatase: 71 U/L (ref 38–126)
Anion gap: 12 (ref 5–15)
BUN: 5 mg/dL — ABNORMAL LOW (ref 8–23)
CO2: 26 mmol/L (ref 22–32)
Calcium: 9.1 mg/dL (ref 8.9–10.3)
Chloride: 98 mmol/L (ref 98–111)
Creatinine, Ser: 0.68 mg/dL (ref 0.61–1.24)
GFR, Estimated: >60 mL/min (ref >60)
Glucose, Bld: 171 mg/dL — ABNORMAL HIGH (ref 70–99)
Potassium: 3.7 mmol/L (ref 3.5–5.1)
Sodium: 136 mmol/L (ref 135–145)
Total Bilirubin: 0.5 mg/dL (ref 0.0–1.2)
Total Protein: 6.6 g/dL (ref 6.5–8.1)

## 2023-10-08 LAB — MRSA NEXT GEN BY PCR, NASAL: MRSA by PCR Next Gen: NOT DETECTED

## 2023-10-08 LAB — CBC WITH DIFFERENTIAL/PLATELET
Abs Immature Granulocytes: 0.06 K/uL (ref 0.00–0.07)
Basophils Absolute: 0.1 K/uL (ref 0.0–0.1)
Basophils Relative: 1 %
Eosinophils Absolute: 0.5 K/uL (ref 0.0–0.5)
Eosinophils Relative: 4 %
HCT: 38.2 % — ABNORMAL LOW (ref 39.0–52.0)
Hemoglobin: 12.6 g/dL — ABNORMAL LOW (ref 13.0–17.0)
Immature Granulocytes: 1 %
Lymphocytes Relative: 28 %
Lymphs Abs: 2.9 K/uL (ref 0.7–4.0)
MCH: 27.5 pg (ref 26.0–34.0)
MCHC: 33 g/dL (ref 30.0–36.0)
MCV: 83.4 fL (ref 80.0–100.0)
Monocytes Absolute: 1.4 K/uL — ABNORMAL HIGH (ref 0.1–1.0)
Monocytes Relative: 13 %
Neutro Abs: 5.7 K/uL (ref 1.7–7.7)
Neutrophils Relative %: 53 %
Platelets: 329 K/uL (ref 150–400)
RBC: 4.58 MIL/uL (ref 4.22–5.81)
RDW: 15.2 % (ref 11.5–15.5)
WBC: 10.6 K/uL — ABNORMAL HIGH (ref 4.0–10.5)
nRBC: 0 % (ref 0.0–0.2)

## 2023-10-08 LAB — APTT: aPTT: 91 s — ABNORMAL HIGH (ref 24–36)

## 2023-10-08 LAB — PROCALCITONIN: Procalcitonin: <0.1 ng/mL

## 2023-10-08 LAB — HEPARIN LEVEL (UNFRACTIONATED): Heparin Unfractionated: >1.1 [IU]/mL — ABNORMAL HIGH (ref 0.30–0.70)

## 2023-10-08 LAB — TROPONIN I (HIGH SENSITIVITY)
Troponin I (High Sensitivity): 12 ng/L (ref <18)
Troponin I (High Sensitivity): 6 ng/L (ref <18)

## 2023-10-08 LAB — BRAIN NATRIURETIC PEPTIDE: B Natriuretic Peptide: 35.3 pg/mL (ref 0.0–100.0)

## 2023-10-08 MED ORDER — ARFORMOTEROL TARTRATE 15 MCG/2ML IN NEBU
15.0000 ug | INHALATION_SOLUTION | Freq: Two times a day (BID) | RESPIRATORY_TRACT | Status: DC
  Administered 2023-10-08 – 2023-10-12 (×8): 15 ug via RESPIRATORY_TRACT
  Filled 2023-10-08 (×8): qty 2

## 2023-10-08 MED ORDER — LORATADINE 10 MG PO TABS
10.0000 mg | ORAL_TABLET | Freq: Every day | ORAL | Status: DC
  Administered 2023-10-08 – 2023-10-12 (×5): 10 mg via ORAL
  Filled 2023-10-08 (×5): qty 1

## 2023-10-08 MED ORDER — ADULT MULTIVITAMIN W/MINERALS CH
1.0000 | ORAL_TABLET | Freq: Every day | ORAL | Status: DC
  Administered 2023-10-08 – 2023-10-12 (×5): 1 via ORAL
  Filled 2023-10-08 (×5): qty 1

## 2023-10-08 MED ORDER — APIXABAN 5 MG PO TABS: 5.0000 mg | ORAL_TABLET | Freq: Two times a day (BID) | ORAL | Status: DC

## 2023-10-08 MED ORDER — HEPARIN (PORCINE) 25000 UT/250ML-% IV SOLN
1000.0000 [IU]/h | INTRAVENOUS | Status: DC
  Administered 2023-10-08: 1000 [IU]/h via INTRAVENOUS
  Filled 2023-10-08: qty 250

## 2023-10-08 MED ORDER — BUDESONIDE 0.25 MG/2ML IN SUSP
0.2500 mg | Freq: Two times a day (BID) | RESPIRATORY_TRACT | Status: DC
  Administered 2023-10-08 – 2023-10-12 (×8): 0.25 mg via RESPIRATORY_TRACT
  Filled 2023-10-08 (×8): qty 2

## 2023-10-08 MED ORDER — ALBUTEROL SULFATE (2.5 MG/3ML) 0.083% IN NEBU
10.0000 mg/h | INHALATION_SOLUTION | Freq: Once | RESPIRATORY_TRACT | Status: AC
  Administered 2023-10-08: 10 mg/h via RESPIRATORY_TRACT
  Filled 2023-10-08: qty 3

## 2023-10-08 MED ORDER — ATORVASTATIN CALCIUM 10 MG PO TABS
10.0000 mg | ORAL_TABLET | Freq: Every day | ORAL | Status: DC
  Administered 2023-10-08 – 2023-10-11 (×4): 10 mg via ORAL
  Filled 2023-10-08 (×4): qty 1

## 2023-10-08 MED ORDER — SODIUM CHLORIDE 0.9 % IV SOLN
2.0000 g | INTRAVENOUS | Status: DC
  Administered 2023-10-08 – 2023-10-12 (×4): 2 g via INTRAVENOUS
  Filled 2023-10-08 (×5): qty 20

## 2023-10-08 MED ORDER — PREDNISONE 20 MG PO TABS
40.0000 mg | ORAL_TABLET | Freq: Every day | ORAL | Status: DC
  Administered 2023-10-09 – 2023-10-10 (×2): 40 mg via ORAL
  Filled 2023-10-08 (×2): qty 2

## 2023-10-08 MED ORDER — METHYLPREDNISOLONE SODIUM SUCC 125 MG IJ SOLR
125.0000 mg | Freq: Once | INTRAMUSCULAR | Status: AC
  Administered 2023-10-08: 125 mg via INTRAVENOUS
  Filled 2023-10-08: qty 2

## 2023-10-08 MED ORDER — BENZTROPINE MESYLATE 2 MG PO TABS
2.0000 mg | ORAL_TABLET | Freq: Two times a day (BID) | ORAL | Status: DC
  Administered 2023-10-08 – 2023-10-12 (×8): 2 mg via ORAL
  Filled 2023-10-08 (×9): qty 1

## 2023-10-08 MED ORDER — LIDOCAINE 5 % EX PTCH
1.0000 | MEDICATED_PATCH | CUTANEOUS | Status: DC
  Administered 2023-10-08 – 2023-10-11 (×4): 1 via TRANSDERMAL
  Filled 2023-10-08 (×3): qty 1

## 2023-10-08 MED ORDER — MIRTAZAPINE 15 MG PO TABS
30.0000 mg | ORAL_TABLET | Freq: Every day | ORAL | Status: DC
  Administered 2023-10-08 – 2023-10-11 (×4): 30 mg via ORAL
  Filled 2023-10-08 (×4): qty 2

## 2023-10-08 MED ORDER — APIXABAN 5 MG PO TABS
5.0000 mg | ORAL_TABLET | Freq: Two times a day (BID) | ORAL | Status: DC
  Administered 2023-10-08 – 2023-10-12 (×8): 5 mg via ORAL
  Filled 2023-10-08 (×8): qty 1

## 2023-10-08 MED ORDER — CARVEDILOL 6.25 MG PO TABS
6.2500 mg | ORAL_TABLET | Freq: Two times a day (BID) | ORAL | Status: DC
  Administered 2023-10-09 – 2023-10-12 (×8): 6.25 mg via ORAL
  Filled 2023-10-08 (×8): qty 1

## 2023-10-08 MED ORDER — GLUCERNA SHAKE PO LIQD
237.0000 mL | Freq: Three times a day (TID) | ORAL | Status: DC
  Administered 2023-10-09 – 2023-10-12 (×7): 237 mL via ORAL

## 2023-10-08 MED ORDER — APIXABAN (ELIQUIS) VTE STARTER PACK (10MG AND 5MG): 5.0000 mg | ORAL_TABLET | Freq: Two times a day (BID) | ORAL | Status: DC

## 2023-10-08 MED ORDER — SODIUM CHLORIDE 0.9% FLUSH
3.0000 mL | Freq: Two times a day (BID) | INTRAVENOUS | Status: DC
  Administered 2023-10-08 – 2023-10-12 (×9): 3 mL via INTRAVENOUS

## 2023-10-08 MED ORDER — ACETAMINOPHEN 325 MG PO TABS: 650.0000 mg | ORAL_TABLET | Freq: Four times a day (QID) | ORAL | Status: DC | PRN

## 2023-10-08 MED ORDER — ACETAMINOPHEN 650 MG RE SUPP: 650.0000 mg | Freq: Four times a day (QID) | RECTAL | Status: DC | PRN

## 2023-10-08 MED ORDER — ALBUTEROL SULFATE (2.5 MG/3ML) 0.083% IN NEBU
2.5000 mg | INHALATION_SOLUTION | RESPIRATORY_TRACT | Status: DC | PRN
Start: 2023-10-08 — End: 2023-10-12
  Administered 2023-10-09 – 2023-10-12 (×12): 2.5 mg via RESPIRATORY_TRACT
  Filled 2023-10-08 (×12): qty 3

## 2023-10-08 MED ORDER — METHOCARBAMOL 500 MG PO TABS: 750.0000 mg | ORAL_TABLET | Freq: Three times a day (TID) | ORAL | Status: DC | PRN

## 2023-10-08 MED ORDER — TAMSULOSIN HCL 0.4 MG PO CAPS
0.4000 mg | ORAL_CAPSULE | Freq: Every day | ORAL | Status: DC
  Administered 2023-10-08 – 2023-10-11 (×4): 0.4 mg via ORAL
  Filled 2023-10-08 (×4): qty 1

## 2023-10-08 MED ORDER — IPRATROPIUM-ALBUTEROL 0.5-2.5 (3) MG/3ML IN SOLN
3.0000 mL | Freq: Four times a day (QID) | RESPIRATORY_TRACT | Status: DC
  Administered 2023-10-08 – 2023-10-09 (×3): 3 mL via RESPIRATORY_TRACT
  Filled 2023-10-08 (×3): qty 3

## 2023-10-08 MED ORDER — GUAIFENESIN ER 600 MG PO TB12
600.0000 mg | ORAL_TABLET | Freq: Two times a day (BID) | ORAL | Status: DC
  Administered 2023-10-08 – 2023-10-09 (×3): 600 mg via ORAL
  Filled 2023-10-08 (×3): qty 1

## 2023-10-08 MED ORDER — RISPERIDONE 3 MG PO TABS
3.0000 mg | ORAL_TABLET | Freq: Two times a day (BID) | ORAL | Status: DC
  Administered 2023-10-08 – 2023-10-12 (×8): 3 mg via ORAL
  Filled 2023-10-08 (×9): qty 1

## 2023-10-08 MED ORDER — MAGNESIUM SULFATE 2 GM/50ML IV SOLN
2.0000 g | Freq: Once | INTRAVENOUS | Status: AC
  Administered 2023-10-08: 2 g via INTRAVENOUS
  Filled 2023-10-08: qty 50

## 2023-10-08 MED ORDER — POLYETHYLENE GLYCOL 3350 17 G PO PACK
17.0000 g | PACK | Freq: Every day | ORAL | Status: DC
  Administered 2023-10-08 – 2023-10-12 (×3): 17 g via ORAL
  Filled 2023-10-08 (×3): qty 1

## 2023-10-08 NOTE — ED Provider Notes (Signed)
 [place] EMERGENCY DEPARTMENT AT [facility] Provider Note   CSN: [id number] Arrival date & time: 10/08/23  9376     Patient presents with: Respiratory Distress   Blake Mcknight is a 68 y.o. male.  {Add pertinent medical, surgical, social history, OB history to HPI:32947} HPI     Prior to Admission medications   Medication Sig Start Date End Date Taking? Authorizing Provider  acetaminophen  (TYLENOL ) 500 MG tablet Take 2 tablets (1,000 mg total) by mouth every 8 (eight) hours. 08/14/23   Fausto Burnard LABOR, DO  albuterol  (PROVENTIL ) (2.5 MG/3ML) 0.083% nebulizer solution Take 3 mLs (2.5 mg total) by nebulization every 4 (four) hours as needed for wheezing or shortness of breath. Patient not taking: Reported on 09/30/2023 08/13/23   Fausto Burnard A, DO  albuterol  (VENTOLIN  HFA) 108 (90 Base) MCG/ACT inhaler Inhale 2 puffs into the lungs every 4 (four) hours as needed for wheezing or shortness of breath. 10/02/22   Bernard Drivers, MD  APIXABAN  (ELIQUIS ) VTE STARTER PACK (10MG  AND 5MG ) Take as directed on package: start with two-5mg  tablets twice daily for 7 days. On day 8, switch to one-5mg  tablet twice daily. 10/01/23   Vernon Ranks, MD  arformoterol  (BROVANA ) 15 MCG/2ML NEBU Take 2 mLs (15 mcg total) by nebulization 2 (two) times daily. 09/10/23   Will Almarie MATSU, MD  atorvastatin  (LIPITOR) 10 MG tablet Take 1 tablet (10 mg total) by mouth at bedtime. 08/14/23   Fausto Burnard A, DO  benztropine  (COGENTIN ) 2 MG tablet Take 1 tablet (2 mg total) by mouth 2 (two) times daily. 07/30/23   Arfeen, Leni DASEN, MD  budesonide  (PULMICORT ) 0.25 MG/2ML nebulizer solution Take 2 mLs (0.25 mg total) by nebulization 2 (two) times daily. 08/14/23 09/30/23  Fausto Burnard A, DO  budesonide -glycopyrrolate -formoterol  (BREZTRI AEROSPHERE) 160-9-4.8 MCG/ACT AERO inhaler Inhale 1-2 puffs into the lungs 2 (two) times daily.    [provider]  carvedilol  (COREG ) 3.125 MG tablet Take 2  tablets (6.25 mg total) by mouth 2 (two) times daily with a meal. 09/10/23   Will Almarie MATSU, MD  cetirizine (ZYRTEC) 10 MG tablet Take 10 mg by mouth daily as needed for allergies or rhinitis.    [provider]  cholecalciferol (VITAMIN D) 25 MCG (1000 UNIT) tablet Take 5,000 Units by mouth every morning.    [provider]  famotidine  (PEPCID ) 40 MG tablet Take 40 mg by mouth 2 (two) times daily. 12/28/18   [provider]  feeding supplement, GLUCERNA SHAKE, (GLUCERNA SHAKE) LIQD Take 237 mLs by mouth 3 (three) times daily between meals. 08/14/23   Fausto Burnard LABOR, DO  ibuprofen  (ADVIL ) 400 MG tablet Take 1 tablet (400 mg total) by mouth every 6 (six) hours as needed (moderate to severe pain). 08/14/23   Fausto Burnard LABOR, DO  ipratropium-albuterol  (DUONEB) 0.5-2.5 (3) MG/3ML SOLN Take 3 mLs by nebulization every 4 (four) hours. Patient not taking: Reported on 09/30/2023 08/14/23   Fausto Burnard A, DO  JANUVIA 100 MG tablet Take 100 mg by mouth daily. 01/30/23   [provider]  lidocaine  (LIDODERM ) 5 % Place 1 patch onto the skin daily. Remove & Discard patch within 12 hours or as directed by MD 09/10/23   Will Almarie MATSU, MD  methocarbamol  (ROBAXIN ) 750 MG tablet Take 1 tablet (750 mg total) by mouth every 8 (eight) hours as needed for muscle spasms. 09/10/23   Will Almarie MATSU, MD  mirtazapine  (REMERON ) 30 MG tablet Take 1 tablet (  30 mg total) by mouth at bedtime as needed (insomnia). Patient taking differently: Take 30 mg by mouth at bedtime. 07/30/23 07/29/24  Arfeen, Leni DASEN, MD  multivitamin (ONE-A-DAY MEN'S) TABS tablet Take 1 tablet by mouth daily.    [provider]  ondansetron  (ZOFRAN ) 4 MG tablet Take 1 tablet (4 mg total) by mouth every 6 (six) hours as needed for nausea. 09/10/23   Will Almarie MATSU, MD  OXYGEN  Inhale 2 L/min into the lungs continuous.     [provider]  polyethylene glycol (MIRALAX  / GLYCOLAX ) 17 g  packet Take 17 g by mouth daily. 09/10/23   Will Almarie MATSU, MD  risperiDONE  (RISPERDAL ) 3 MG tablet Take 1 tablet (3 mg total) by mouth 2 (two) times daily. 07/30/23   Arfeen, Leni DASEN, MD  sodium chloride  (OCEAN) 0.65 % SOLN nasal spray Place 1 spray into both nostrils as needed for congestion.    [provider]  tamsulosin  (FLOMAX ) 0.4 MG CAPS capsule Take 0.4 mg by mouth in the morning and at bedtime. 07/23/20   [provider]    Allergies: Patient has no known allergies.    Review of Systems  Updated Vital Signs BP 113/85   Pulse (!) 110   Temp (!) 97.1 F (36.2 C) (Temporal)   Resp 20   Ht 5' 6 (1.676 m)   Wt 57 kg   SpO2 100%   BMI 20.28 kg/m   Physical Exam  (all labs ordered are listed, but only abnormal results are displayed) Labs Reviewed  CBC WITH DIFFERENTIAL/PLATELET - Abnormal; Notable for the following components:      Result Value   WBC 10.6 (*)    Hemoglobin 12.6 (*)    HCT 38.2 (*)    Monocytes Absolute 1.4 (*)    All other components within normal limits  COMPREHENSIVE METABOLIC PANEL WITH GFR  BRAIN NATRIURETIC PEPTIDE  TROPONIN I (HIGH SENSITIVITY)    EKG: None  Radiology: No results found.  {Document cardiac monitor, telemetry assessment procedure when appropriate:32947} Procedures   Medications Ordered in the ED  magnesium  sulfate IVPB 2 g 50 mL (2 g Intravenous New Bag/Given 10/08/23 0646)  albuterol  (PROVENTIL ) (2.5 MG/3ML) 0.083% nebulizer solution (10 mg/hr Nebulization Given 10/08/23 9360)  methylPREDNISolone  sodium succinate (SOLU-MEDROL ) 125 mg/2 mL injection 125 mg (125 mg Intravenous Given 10/08/23 0643)      {Click here for ABCD2, HEART and other calculators REFRESH Note before signing:1}                              Medical Decision Making Amount and/or Complexity of Data Reviewed Labs: ordered. Radiology: ordered.  Risk Prescription drug management.   ***  {Document critical care time when  appropriate  Document review of labs and clinical decision tools ie CHADS2VASC2, etc  Document your independent review of radiology images and any outside records  Document your discussion with family members, caretakers and with consultants  Document social determinants of health affecting pt's care  Document your decision making why or why not admission, treatments were needed:32947:::1}   Final diagnoses:  None    ED Discharge Orders     None

## 2023-10-08 NOTE — ED Notes (Signed)
 CCM called for tele admit.

## 2023-10-08 NOTE — Progress Notes (Signed)
 Mask was not sealing well and too far down on nose. Mask changed from Medium to Large. Fit is better and better volumes. Vitals stable. Family and RN at bedside.

## 2023-10-08 NOTE — Progress Notes (Signed)
 PHARMACY - ANTICOAGULATION CONSULT NOTE  Pharmacy Consult for IV heparin   Indication: PE hx  No Known Allergies  Patient Measurements: Height: 5' 6 (167.6 cm) Weight: 57 kg (125 lb 10.6 oz) IBW/kg (Calculated) : 63.8 HEPARIN  DW (KG): 57  Vital Signs: Temp: 97.1 F (36.2 C) (07/24 0647) Temp Source: Temporal (07/24 0647) BP: 116/77 (07/24 0730) Pulse Rate: 102 (07/24 0730)  Labs: Recent Labs    10/08/23 0632  HGB 12.6*  HCT 38.2*  PLT 329    Estimated Creatinine Clearance: 74.2 mL/min (A) (by C-G formula based on SCr of 0.54 mg/dL (L)).   Medical History: Past Medical History:  Diagnosis Date   Abdominal pain 06/03/2013   Chest pain 08/10/2023   COPD (chronic obstructive pulmonary disease) (HCC)    with bullous emphysema.    COPD exacerbation (HCC) 08/09/2023   Heavy cigarette smoker before 2003   pt claims only 10 cigs per day, never heavier amounts.    HLD (hyperlipidemia)    Hypertension    Schizophrenia (HCC) 06/28/2013   This is a chronic condition and he lives with family   Assessment: Blake Mcknight is a 68 y.o. year old male admitted on 10/08/2023 with concern for shortness of breath. Of note, recently diagnosis of new PE - CTA chest on 09/30/23 a/c with PE in R lower lobe pulmonary artery noting moderate clot burden and RHS. Started on eliquis  for anticoagulation prior to admission (last dose prior to admission 7/23 8pm). Pt reported to me in room that he has not missed any doses of eliquis  prior to presentation. Will monitor with aPTT given presence of DOAC prior to admission falsely elevating anti-xa (heparin  level). Pharmacy consulted to dose heparin .  Goal of Therapy:  Heparin  level 0.3-0.7 units/ml Heparin  level 66-102 units/ml Monitor platelets by anticoagulation protocol: Yes   Plan:  Heparin  infusion at 1000 units/hr (no bolus)  6h aPTT  Daily aPTT, heparin  level, CBC, and monitoring for bleeding F/u plans for anticoagulation   Thank  you for allowing pharmacy to participate in this patient's care.  Leonor GORMAN Bash, PharmD Emergency Medicine Clinical Pharmacist 10/08/2023,7:52 AM

## 2023-10-08 NOTE — Progress Notes (Signed)
 MD wanted to wean patient off BIPAP to see if he will tolerate it. Patient was placed on 4L [place]. Tolerating well at this time 100% SPO2. No distress/ WOB noted. Will continue to monitor. RN and family at bedside.

## 2023-10-08 NOTE — H&P (Addendum)
 History and Physical    Patient: Blake Mcknight FMW:[id number] DOB: [birth year] DOA: 10/08/2023 DOS: the patient was seen and examined on 10/08/2023 PCP: [name], MD  Patient coming from: Home via EMS  Chief Complaint:  Chief Complaint  Patient presents with   Respiratory Distress   HPI: Blake Mcknight is a 68 y.o. male with medical history significant of hypertension, hyperlipidemia, COPD, chronic respiratory failure, history of lung cancer s/p SBRT, diabetes mellitus type 2, and schizophrenia presents with worsening shortness of breath.  History is obtained from his sister who is present at bedside and helps as a caregiver.  He has been experiencing worsening breathing difficulties. Initially, he was doing well for a couple of days, but this morning, he had trouble coughing up phlegm despite feeling it 'rattling' in his chest.  He attempted to manage his symptoms with his usual treatment at 5:30 AM, which included using an inhaler, but it did not alleviate his symptoms.  He has a history of schizophrenia, which is relevant to his current care as it affects his ability to follow instructions during medical interventions his sister makes note.  With EMS from home O2 saturations noted to be as low as 54% on 4 L of oxygen . He was given solumedrol 125 mg IV, 2 Duonebs, and magnesium  sulfate 2 g.  Review of records note patient had just recently been hospitalized from 7/16-7/17 with acute on chronic respiratory failure with hypoxia found to have concerns for acute on chronic right-sided pulmonary embolism with moderate clot burden/submassive with concern for right heart strain which was seen on echocardiogram.  Patient had been started on Lovenox  and after talks with oncology transition to Eliquis .  Prior to that had been admitted for in May and June for acute on chronic respiratory failure related to COPD exacerbations.  Patient presented as noted to be afebrile with heart  rates elevated up to 116, respirations 20-24, and O2 saturations currently maintained on BiPAP.  Labs significant for WBC 10.6, hemoglobin 12.6, glucose 171, BMP 35.3, and high-sensitivity troponin negative.  Chest x-ray noted marked hyperinflation with emphysema, diffuse bullous changes, and patchy airspace disease in the medial right lung base similar to prior.  Patient had been given 125 mg of Solu-Medrol  IV and started on heparin  drip.  Review of Systems: As mentioned in the history of present illness. All other systems reviewed and are negative. Past Medical History:  Diagnosis Date   Abdominal pain 06/03/2013   Chest pain 08/10/2023   COPD (chronic obstructive pulmonary disease) (HCC)    with bullous emphysema.    COPD exacerbation (HCC) 08/09/2023   Heavy cigarette smoker before 2003   pt claims only 10 cigs per day, never heavier amounts.    HLD (hyperlipidemia)    Hypertension    Schizophrenia (HCC) 06/28/2013   This is a chronic condition and he lives with family   Past Surgical History:  Procedure Laterality Date   CHOLECYSTECTOMY N/A 06/06/2013   Procedure: LAPAROSCOPIC CHOLECYSTECTOMY WITH ATTEMPTED INTRAOPERATIVE CHOLANGIOGRAM;  Surgeon: Dann FORBES Hummer, MD;  Location: [facility] OR;  Service: General;  Laterality: N/A;   ERCP N/A 06/03/2013   Procedure: ENDOSCOPIC RETROGRADE CHOLANGIOPANCREATOGRAPHY (ERCP);  Surgeon: Gwendlyn ONEIDA Buddy, MD;  Location: [facility] ENDOSCOPY;  Service: Endoscopy;  Laterality: N/A;   Social History:  reports that he has quit smoking. His smoking use included cigarettes. He has a 41 pack-year smoking history. He has never used smokeless tobacco. He reports that he does not drink alcohol and does  not use drugs.  No Known Allergies  Family History  Problem Relation Age of Onset   Diabetes Mellitus II Mother    Diabetes Mother    Heart disease Father    Stroke Maternal Aunt    CAD Neg Hx     Prior to Admission medications   Medication Sig Start Date End  Date Taking? Authorizing Provider  acetaminophen  (TYLENOL ) 500 MG tablet Take 2 tablets (1,000 mg total) by mouth every 8 (eight) hours. 08/14/23   Fausto Burnard LABOR, DO  albuterol  (PROVENTIL ) (2.5 MG/3ML) 0.083% nebulizer solution Take 3 mLs (2.5 mg total) by nebulization every 4 (four) hours as needed for wheezing or shortness of breath. Patient not taking: Reported on 09/30/2023 08/13/23   Fausto Burnard LABOR, DO  albuterol  (VENTOLIN  HFA) 108 (90 Base) MCG/ACT inhaler Inhale 2 puffs into the lungs every 4 (four) hours as needed for wheezing or shortness of breath. 10/02/22   Steinl, Kevin, MD  APIXABAN  (ELIQUIS ) VTE STARTER PACK (10MG  AND 5MG ) Take as directed on package: start with two-5mg  tablets twice daily for 7 days. On day 8, switch to one-5mg  tablet twice daily. 10/01/23   Vernon Ranks, MD  arformoterol  (BROVANA ) 15 MCG/2ML NEBU Take 2 mLs (15 mcg total) by nebulization 2 (two) times daily. 09/10/23   Will Almarie MATSU, MD  atorvastatin  (LIPITOR) 10 MG tablet Take 1 tablet (10 mg total) by mouth at bedtime. 08/14/23   Fausto Burnard LABOR, DO  benztropine  (COGENTIN ) 2 MG tablet Take 1 tablet (2 mg total) by mouth 2 (two) times daily. 07/30/23   Arfeen, Leni DASEN, MD  budesonide  (PULMICORT ) 0.25 MG/2ML nebulizer solution Take 2 mLs (0.25 mg total) by nebulization 2 (two) times daily. 08/14/23 09/30/23  Fausto Burnard LABOR, DO  budesonide -glycopyrrolate -formoterol  (BREZTRI AEROSPHERE) 160-9-4.8 MCG/ACT AERO inhaler Inhale 1-2 puffs into the lungs 2 (two) times daily.    [provider]  carvedilol  (COREG ) 3.125 MG tablet Take 2 tablets (6.25 mg total) by mouth 2 (two) times daily with a meal. 09/10/23   Will Almarie MATSU, MD  cetirizine (ZYRTEC) 10 MG tablet Take 10 mg by mouth daily as needed for allergies or rhinitis.    [provider]  cholecalciferol (VITAMIN D) 25 MCG (1000 UNIT) tablet Take 5,000 Units by mouth every morning.    [provider]  famotidine  (PEPCID ) 40 MG  tablet Take 40 mg by mouth 2 (two) times daily. 12/28/18   [provider]  feeding supplement, GLUCERNA SHAKE, (GLUCERNA SHAKE) LIQD Take 237 mLs by mouth 3 (three) times daily between meals. 08/14/23   Fausto Burnard LABOR, DO  ibuprofen  (ADVIL ) 400 MG tablet Take 1 tablet (400 mg total) by mouth every 6 (six) hours as needed (moderate to severe pain). 08/14/23   Fausto Burnard LABOR, DO  ipratropium-albuterol  (DUONEB) 0.5-2.5 (3) MG/3ML SOLN Take 3 mLs by nebulization every 4 (four) hours. Patient not taking: Reported on 09/30/2023 08/14/23   Fausto Burnard A, DO  JANUVIA 100 MG tablet Take 100 mg by mouth daily. 01/30/23   [provider]  lidocaine  (LIDODERM ) 5 % Place 1 patch onto the skin daily. Remove & Discard patch within 12 hours or as directed by MD 09/10/23   Will Almarie MATSU, MD  methocarbamol  (ROBAXIN ) 750 MG tablet Take 1 tablet (750 mg total) by mouth every 8 (eight) hours as needed for muscle spasms. 09/10/23   Will Almarie MATSU, MD  mirtazapine  (REMERON ) 30 MG tablet Take 1 tablet (30 mg total) by mouth at  bedtime as needed (insomnia). Patient taking differently: Take 30 mg by mouth at bedtime. 07/30/23 07/29/24  Arfeen, Leni DASEN, MD  multivitamin (ONE-A-DAY MEN'S) TABS tablet Take 1 tablet by mouth daily.    [provider]  ondansetron  (ZOFRAN ) 4 MG tablet Take 1 tablet (4 mg total) by mouth every 6 (six) hours as needed for nausea. 09/10/23   Will Almarie MATSU, MD  OXYGEN  Inhale 2 L/min into the lungs continuous.     [provider]  polyethylene glycol (MIRALAX  / GLYCOLAX ) 17 g packet Take 17 g by mouth daily. 09/10/23   Will Almarie MATSU, MD  risperiDONE  (RISPERDAL ) 3 MG tablet Take 1 tablet (3 mg total) by mouth 2 (two) times daily. 07/30/23   Arfeen, Leni DASEN, MD  sodium chloride  (OCEAN) 0.65 % SOLN nasal spray Place 1 spray into both nostrils as needed for congestion.    [provider]  tamsulosin  (FLOMAX ) 0.4 MG CAPS capsule Take 0.4  mg by mouth in the morning and at bedtime. 07/23/20   [provider]    Physical Exam: Vitals:   10/08/23 0815 10/08/23 0821 10/08/23 0830 10/08/23 0845  BP: 124/73  127/85 128/84  Pulse: 93 96 95 93  Resp: 15 17 19 18   Temp:      TempSrc:      SpO2: 100%  100% 100%  Weight:      Height:       Constitutional: Chronically ill-appearing elderly male currently on BiPAP. Eyes: PERRL, lids and conjunctivae normal ENMT: Mucous membranes are moist. P Normal dentition.  Neck: normal, supple  Respiratory: Decreased aeration with expiratory wheezes appreciated currently in BiPAP. Cardiovascular: Regular rate and rhythm, no murmurs / rubs / gallops. No extremity edema. 2+ pedal pulses. No carotid bruits.  Abdomen: no tenderness, no masses palpated.  Bowel sounds positive.  Musculoskeletal: no clubbing / cyanosis. No joint deformity upper and lower extremities. Good ROM, no contractures. Normal muscle tone.  Skin: no rashes, lesions, ulcers. No induration Neurologic: CN 2-12 grossly intact.  Patient able to move all extremities Psychiatric: Normal judgment and insight.  Lethargic.    Data Reviewed:   EKG reveals sinus tachycardia 122 bpm.  Reviewed labs, imaging, and pertinent records as documented.  Assessment and Plan:  Acute on chronic respiratory failure with hypoxia COPD exacerbation Patient presents after being unable to cough up sputum this morning despite receiving breathing treatment.  Chest x-ray noting a similar patchy airspace disease of the right medial lung base.  O2 saturations were reported to be as low as 54% on 4 L of nasal cannula oxygen  for which patient was placed on BiPAP with improvement in work of breathing.  Patient had been given Solu-Medrol  IV, 2 DuoNeb breathing treatments, and magnesium  sulfate - Admit to a progressive bed - Continuous pulse oximetry with oxygen  to maintain O2 saturations - BiPAP as needed - Add-on procalcitonin - Incentive spirometry  and flutter valve once able - DuoNebs scheduled - Brovana  and budesonide  nebs - Prednisone  - Empiric antibiotics of Rocephin  - Mucinex   History of PE Patient had just recently been found to have acute on chronic pulmonary embolism when last checked on 7/16.  Echocardiogram noted no signs for right heart strain and patient was started on Eliquis . - Continue Eliquis   Lung cancer Patient with a right lower lobe mass and presumed to be NSCLC. Not a candidate for EBUS; s/p radiation rx about 1 year ago-primarily followed by Dr. Dante of pulmonology.  It appears that the  patient's sister has had talks with palliative.  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  No further workup was recommended as patient was asymptomatic, but thought likely cancerous in nature.  Schizophrenia - Continue Cogentin , Risperdal , and Remeron   Hyperlipidemia - Continue Lipitor  BPH - Continue tamsulosin    DVT prophylaxis: Eliquis   Advance Care Planning:   Code Status: Limited: Do not attempt resuscitation (DNR) -DNR-LIMITED -Do Not Intubate/DNI    Consults: none  Family Communication:    Severity of Illness: The appropriate patient status for this patient is INPATIENT. Inpatient status is judged to be reasonable and necessary in order to provide the required intensity of service to ensure the patient's safety. The patient's presenting symptoms, physical exam findings, and initial radiographic and laboratory data in the context of their chronic comorbidities is felt to place them at high risk for further clinical deterioration. Furthermore, it is not anticipated that the patient will be medically stable for discharge from the hospital within 2 midnights of admission.   * I certify that at the point of admission it is my clinical judgment that the patient will require inpatient hospital care spanning beyond 2 midnights from the point of admission due to  high intensity of service, high risk for further deterioration and high frequency of surveillance required.*  Author: Maximino DELENA Sharps, MD 10/08/2023 9:13 AM  For on call review [email or web address].

## 2023-10-08 NOTE — ED Triage Notes (Signed)
 Pt BIB [facility] from home, pt in respiratory distress on ems arrival. 54% on 4L [place]; CPAP applied. 125mg  Solumedrol, 2 Duonebs, and 2g mag given in route- on arrival to ED O2 at 95%. Does not wear O2 at baseline, denies CP

## 2023-10-08 NOTE — ED Provider Notes (Signed)
 Received patient in turnover from Dr. Lorette.  Please see their note for further details of Hx, PE.  Briefly patient is a 68 y.o. male with a Respiratory Distress .  Likely COPD exacerbation.  Labs, admit.  Patient with some improvement in his symptoms.  Remains on BiPAP.  Wife provides further history.  States that he was doing well until about a day ago and she felt he had been coughing up quite a bit of sputum.  Like he was a bit more rattly  CRITICAL CARE Performed by: Toribio Belvie Quale   Total critical care time: 35 minutes  Critical care time was exclusive of separately billable procedures and treating other patients.  Critical care was necessary to treat or prevent imminent or life-threatening deterioration.  Critical care was time spent personally by me on the following activities: development of treatment plan with patient and/or surrogate as well as nursing, discussions with consultants, evaluation of patient's response to treatment, examination of patient, obtaining history from patient or surrogate, ordering and performing treatments and interventions, ordering and review of laboratory studies, ordering and review of radiographic studies, pulse oximetry and re-evaluation of patient's condition.   The patients results and plan were reviewed and discussed.   Any x-rays performed were independently reviewed by myself.   Differential diagnosis were considered with the presenting HPI.  Medications  heparin  ADULT infusion 100 units/mL (25000 units/250mL) (1,000 Units/hr Intravenous New Bag/Given 10/08/23 0839)  albuterol  (PROVENTIL ) (2.5 MG/3ML) 0.083% nebulizer solution (10 mg/hr Nebulization Given 10/08/23 0639)  magnesium  sulfate IVPB 2 g 50 mL (0 g Intravenous Stopped 10/08/23 0757)  methylPREDNISolone  sodium succinate (SOLU-MEDROL ) 125 mg/2 mL injection 125 mg (125 mg Intravenous Given 10/08/23 0643)    Vitals:   10/08/23 0815 10/08/23 0821 10/08/23 0830 10/08/23 0845  BP:  124/73  127/85 128/84  Pulse: 93 96 95 93  Resp: 15 17 19 18   Temp:      TempSrc:      SpO2: 100%  100% 100%  Weight:      Height:        Final diagnoses:  COPD exacerbation (HCC)    Admission/ observation were discussed with the admitting physician, patient and/or family and they are comfortable with the plan.      Quale Share, DO 10/08/23 [phone]

## 2023-10-08 NOTE — ED Notes (Signed)
 Admitting at bedside. RT called to try & wean him off Bipap, per admitting verbal order.

## 2023-10-09 ENCOUNTER — Encounter (HOSPITAL_COMMUNITY): Payer: Self-pay | Admitting: Internal Medicine

## 2023-10-09 DIAGNOSIS — Z86711 Personal history of pulmonary embolism: Secondary | ICD-10-CM | POA: Diagnosis not present

## 2023-10-09 DIAGNOSIS — J441 Chronic obstructive pulmonary disease with (acute) exacerbation: Secondary | ICD-10-CM | POA: Diagnosis not present

## 2023-10-09 DIAGNOSIS — Z85118 Personal history of other malignant neoplasm of bronchus and lung: Secondary | ICD-10-CM | POA: Diagnosis not present

## 2023-10-09 DIAGNOSIS — J9621 Acute and chronic respiratory failure with hypoxia: Secondary | ICD-10-CM | POA: Diagnosis not present

## 2023-10-09 LAB — HEPARIN LEVEL (UNFRACTIONATED): Heparin Unfractionated: >1.1 [IU]/mL — ABNORMAL HIGH (ref 0.30–0.70)

## 2023-10-09 LAB — CBC
HCT: 35.9 % — ABNORMAL LOW (ref 39.0–52.0)
Hemoglobin: 11.7 g/dL — ABNORMAL LOW (ref 13.0–17.0)
MCH: 26.8 pg (ref 26.0–34.0)
MCHC: 32.6 g/dL (ref 30.0–36.0)
MCV: 82.3 fL (ref 80.0–100.0)
Platelets: 318 K/uL (ref 150–400)
RBC: 4.36 MIL/uL (ref 4.22–5.81)
RDW: 14.6 % (ref 11.5–15.5)
WBC: 9.4 K/uL (ref 4.0–10.5)
nRBC: 0 % (ref 0.0–0.2)

## 2023-10-09 LAB — BASIC METABOLIC PANEL WITH GFR
Anion gap: 11 (ref 5–15)
BUN: 7 mg/dL — ABNORMAL LOW (ref 8–23)
CO2: 29 mmol/L (ref 22–32)
Calcium: 9.2 mg/dL (ref 8.9–10.3)
Chloride: 98 mmol/L (ref 98–111)
Creatinine, Ser: 0.74 mg/dL (ref 0.61–1.24)
GFR, Estimated: >60 mL/min (ref >60)
Glucose, Bld: 137 mg/dL — ABNORMAL HIGH (ref 70–99)
Potassium: 4.1 mmol/L (ref 3.5–5.1)
Sodium: 138 mmol/L (ref 135–145)

## 2023-10-09 LAB — APTT: aPTT: 42 s — ABNORMAL HIGH (ref 24–36)

## 2023-10-09 MED ORDER — IPRATROPIUM-ALBUTEROL 0.5-2.5 (3) MG/3ML IN SOLN: 3.0000 mL | Freq: Three times a day (TID) | RESPIRATORY_TRACT | Status: DC

## 2023-10-09 MED ORDER — IPRATROPIUM-ALBUTEROL 0.5-2.5 (3) MG/3ML IN SOLN
3.0000 mL | Freq: Two times a day (BID) | RESPIRATORY_TRACT | Status: DC
  Administered 2023-10-09 – 2023-10-10 (×2): 3 mL via RESPIRATORY_TRACT
  Filled 2023-10-09 (×2): qty 3

## 2023-10-09 MED ORDER — GUAIFENESIN ER 600 MG PO TB12
1200.0000 mg | ORAL_TABLET | Freq: Two times a day (BID) | ORAL | Status: DC
  Administered 2023-10-09 – 2023-10-12 (×6): 1200 mg via ORAL
  Filled 2023-10-09 (×6): qty 2

## 2023-10-09 NOTE — TOC Initial Note (Signed)
 Transition of Care [facility]) - Initial/Assessment Note    Patient Details  Name: Blake Mcknight MRN: [id number] Date of Birth: May 13, 1955  Transition of Care [facility]) CM/SW Contact:    Nola Devere Hands, RN Phone Number: 10/09/2023, 11:08 AM  Clinical Narrative:                 Patient is a 68 y.o. male with medical history significant of hypertension, hyperlipidemia, COPD, chronic respiratory failure, history of lung cancer s/p SBRT, diabetes mellitus type 2, and schizophrenia presents with worsening shortness of breath. Patient presently weaned from Bipap, is on 4L [place]. TOC Team will continue to follow.          Patient Goals and CMS Choice            Expected Discharge Plan and Services                                              Prior Living Arrangements/Services                       Activities of Daily Living   ADL Screening (condition at time of admission) Independently performs ADLs?: No Does the patient have a NEW difficulty with bathing/dressing/toileting/self-feeding that is expected to last >3 days?: No Does the patient have a NEW difficulty with getting in/out of bed, walking, or climbing stairs that is expected to last >3 days?: No Does the patient have a NEW difficulty with communication that is expected to last >3 days?: No Is the patient deaf or have difficulty hearing?: No Does the patient have difficulty seeing, even when wearing glasses/contacts?: No Does the patient have difficulty concentrating, remembering, or making decisions?: No  Permission Sought/Granted                  Emotional Assessment              Admission diagnosis:  COPD exacerbation (HCC) [J44.1] Acute on chronic respiratory failure with hypoxia (HCC) [J96.21] Patient Active Problem List   Diagnosis Date Noted   Lung mass 10/08/2023   History of pulmonary embolism 10/08/2023   Acute on chronic hypoxic respiratory failure (HCC) 09/30/2023   Acute  pulmonary embolism (HCC) 09/30/2023   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   Acute on chronic respiratory failure with hypoxia (HCC) 09/06/2023   History of lung cancer 09/06/2023   Controlled type 2 diabetes mellitus without complication, without long-term current use of insulin  (HCC) 09/06/2023   Normocytic anemia 09/06/2023   BPH (benign prostatic hyperplasia) 09/06/2023   Low back pain 08/14/2023   CAP (community acquired pneumonia) 04/16/2023   Right upper quadrant abdominal pain 04/16/2023   Abnormal CT of the chest 03/05/2022   Malignant neoplasm of lower lobe of right lung (HCC) 07/17/2021   COPD with acute exacerbation (HCC) 09/02/2020   Hypoxia    COPD exacerbation (HCC) 06/05/2019   Do not resuscitate 02/18/2019   Acute respiratory failure with hypoxia (HCC) 06/16/2016   Chronic hypoxemic respiratory failure (HCC) 05/25/2016   Hyponatremia 09/22/2015   Bullous emphysema (HCC) 07/26/2013   Schizophrenia (HCC) 06/28/2013   Tobacco use disorder 06/06/2013   HTN (hypertension) 06/03/2013   HLD (hyperlipidemia) 06/03/2013   COPD (chronic obstructive pulmonary disease) (HCC) 06/03/2013   Calculus of bile duct 06/03/2013   PCP:  [name], MD Pharmacy:   [id number] [address], [place] - [address] AT [facility] [address] [facility] [address] [place] [address] Phone: [phone] Fax: [phone]  [facility] [address] [place] [address] Phone: [phone] Fax: [phone]  [facility] [address] [place] [address] Phone: [phone] Fax: [phone]     Social Drivers of Health (SDOH) Social History: SDOH Screenings   Food Insecurity: No Food Insecurity (10/08/2023)  Recent Concern: Food Insecurity - Food Insecurity Present (08/15/2023)  Housing: Low Risk  (10/08/2023)  Transportation Needs: No Transportation Needs (10/08/2023)   Utilities: Not At Risk (10/08/2023)  Recent Concern: Utilities - At Risk (08/15/2023)  Social Connections: Socially Isolated (10/08/2023)  Tobacco Use: Medium Risk (09/06/2023)   SDOH Interventions:     Readmission Risk Interventions    04/17/2023   11:09 AM  Readmission Risk Prevention Plan  Transportation Screening Complete  PCP or Specialist Appt within 5-7 Days Complete  Home Care Screening Complete  Medication Review (RN CM) Complete

## 2023-10-09 NOTE — Discharge Instructions (Signed)

## 2023-10-09 NOTE — Progress Notes (Signed)
 Mobility Specialist Progress Note:    10/09/23 1110  Mobility  Activity Ambulated with assistance in room;Ambulated with assistance in hallway  Level of Assistance Contact guard assist, steadying assist  Assistive Device None  Distance Ambulated (ft) 50 ft  Activity Response Tolerated well  Mobility Referral Yes  Mobility visit 1 Mobility  Mobility Specialist Start Time (ACUTE ONLY) 1110  Mobility Specialist Stop Time (ACUTE ONLY) 1125  Mobility Specialist Time Calculation (min) (ACUTE ONLY) 15 min   Pt received in bed, max encouragement to participate in mobility d/t fatigue. Pt required CGA for safety. Pt ambulated to door and back to bed. Declined transferring to chair or further ambulation. Pt back in bed, RN notified. Linens changed. Max HR 110 bpm. Left in bed, alarm on. RN notified.    Gabreille Dardis Mobility Specialist Please contact via Special educational needs teacher or  Rehab office at [phone]

## 2023-10-09 NOTE — Progress Notes (Signed)
 PROGRESS NOTE    Blake Mcknight  FMW:[id number] DOB: 1955-04-29 DOA: 10/08/2023 PCP: [name], MD   Brief Narrative:  Patient is a 68 year old African-American male with past medical history significant for Mannam to essential hypertension, hyperlipidemia, COPD with chronic respiratory failure, history of lung cancer status post SBRT, diabetes mellitus type 2, schizophrenia and other comorbidities who presents with worsening shortness of breath.  He had worsening shortness of breath over the last few days and had tried coughing up phlegm and attempted to manage his symptoms with his normal inhalers.  He denies radiating symptoms.  Given his respiratory distress he is brought to the ED and noted to be satting 54% on 4 L supplemental oxygen .  In the ED he was given Solu-Medrol  1.5 mg, DuoNebs and magnesium  sulfate.  Recently hospitalized from [phone] with acute on chronic respiratory failure apparently was concern for acute on chronic right-sided pulmonary embolism with moderate clot burden.  He was initiated on Lovenox  but then transition to apixaban .  Also notably he has been recently admitted for acute on chronic respiratory failure in the setting of COPD exacerbations in last few months.  Respiratory Status is improving slowly now.  Assessment and Plan:  Acute on chronic respiratory failure with hypoxia in the setting of COPD exacerbation:  Patient presents after being unable to cough up sputum this morning despite receiving breathing treatment.  Chest x-ray noting a similar patchy airspace disease of the right medial lung base.  O2 saturations were reported to be as low as 54% on 4 L of nasal cannula oxygen  for which patient was placed on BiPAP with improvement in work of breathing.  Patient had been given Solu-Medrol  IV, 2 DuoNeb breathing treatments, and magnesium  sulfate in the ED -Admitted to a progressive bed -Continuous pulse oximetry with oxygen  to maintain O2  saturations -BiPAP as needed -Procalcitonin was <0.10 -Incentive spirometry and flutter valve once able -C/w DuoNebs scheduled (BID) as well as arformoterol  15 mcg nebs twice daily and Budesonide  0.25 mg nebs twice daily -C/w Prednisone  40 mg po Daily  -Continue Empiric antibiotics of IV Ceftriaxone  -Increase guaifenesin  to 1200 mL p.o. twice daily, continue flutter valve, incentive spirometry -Will need an ambulatory home O2 screen prior to discharge and will need to monitor respiratory status carefully.   History of PE: Patient had just recently been found to have acute on chronic pulmonary embolism when last checked on 7/16.  Echocardiogram noted no signs for right heart strain and patient was started on Apixaban ; continue with Apixaban  5 mg p.o. twice daily   Lung Cancer: Patient with a right lower lobe mass and presumed to be NSCLC. Not a candidate for EBUS; s/p radiation rx about 1 year ago-primarily followed by Dr. Dante of pulmonology.  It appears that the patient's sister has had talks with palliative.   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  No further workup was recommended as patient was asymptomatic, but thought likely cancerous in nature.   Schizophrenia: Continue Benztropine  2 mg p.o. twice daily, Risperidone  3 mg p.o. twice daily, and Mirtazapine  30 mg p.o. nightly   Hyperlipidemia: C/w Atorvastatin  10 mg po qHS   BPH: C/w Tamsulosin  0.4 mg po qSupper   Normocytic Anemia: Hgb/Hct went from 12.6/38.2 -> 11.7/35.9. Check Anemia Panel in the AM. CTM for S/Sx of Bleeding; No overt bleeding noted. Repeat CBC in the AM  Hypoalbuminemia: Patient's Albumin Lvl went from 2.7 -> 2.6.  CTM and Trend and repeat CMP in the AM   DVT prophylaxis:  apixaban  (ELIQUIS ) tablet 5 mg    Code Status: Limited: Do not attempt resuscitation (DNR) -DNR-LIMITED -Do Not Intubate/DNI  Family Communication: No family present @  bedside  Disposition Plan:  Level of care: Progressive Status is: Inpatient Remains inpatient appropriate because: Dysphagia clinical improvement in his respiratory status and will need an amatory home O2 screen prior to discharge   Consultants:  None  Procedures:  As delineated as above  Antimicrobials:  Anti-infectives (From admission, onward)    Start     Dose/Rate Route Frequency Ordered Stop   10/08/23 2030  cefTRIAXone  (ROCEPHIN ) 2 g in sodium chloride  0.9 % 100 mL IVPB        2 g 200 mL/hr over 30 Minutes Intravenous Every 24 hours 10/08/23 1941         Subjective: Seen and examined at bedside and he is resting but states that he is feeling little bit better.  Wanting to sleep.  No nausea or vomiting.  Denies any lightheadedness or dizziness.  Thinks his shortness of breath is improving.  Objective: Vitals:   10/09/23 0300 10/09/23 0806 10/09/23 1117 10/09/23 1557  BP: (!) 156/85 123/85 119/81 139/65  Pulse: (!) 102 (!) 111 (!) 102 85  Resp:  17 20 16   Temp: 97.8 F (36.6 C) 98.2 F (36.8 C) (!) 97.5 F (36.4 C) 98 F (36.7 C)  TempSrc: Axillary Oral Oral Oral  SpO2: 100% 99% 100% 100%  Weight:      Height:        Intake/Output Summary (Last 24 hours) at 10/09/2023 1814 Last data filed at 10/09/2023 1604 Gross per 24 hour  Intake 1203 ml  Output 2100 ml  Net -897 ml   Filed Weights   10/08/23 0634  Weight: 57 kg   Examination: Physical Exam:  Constitutional: Thin elderly chronically ill-appearing African-American male who is resting in no acute distress Respiratory: Diminished to auscultation bilaterally with some coarse breath sounds, no wheezing, rales, rhonchi or crackles. Normal respiratory effort and patient is not tachypenic. No accessory muscle use.  Wearing supplemental oxygen  nasal cannula Cardiovascular: RRR, no murmurs / rubs / gallops. S1 and S2 auscultated. No extremity edema. Abdomen: Soft, non-tender, non-distended.  Bowel sounds  positive.  GU: Deferred. Musculoskeletal: No clubbing / cyanosis of digits/nails. No joint deformity upper and lower extremities.  Skin: No rashes, lesions, ulcers on a limited skin evaluation. No induration; Warm and dry.  Neurologic: CN 2-12 grossly intact with no focal deficits but is little somnolent drowsy. Romberg sign and cerebellar reflexes not assessed.  Psychiatric: Alert somnolent and drowsy and appears calm  Data Reviewed: I have personally reviewed following labs and imaging studies  CBC: Recent Labs  Lab 10/08/23 0632 10/09/23 0351  WBC 10.6* 9.4  NEUTROABS 5.7  --   HGB 12.6* 11.7*  HCT 38.2* 35.9*  MCV 83.4 82.3  PLT 329 318   Basic Metabolic Panel: Recent Labs  Lab 10/08/23 0632 10/09/23 0351  NA 136 138  K 3.7 4.1  CL 98 98  CO2 26 29  GLUCOSE 171* 137*  BUN 5* 7*  CREATININE 0.68 0.74  CALCIUM  9.1 9.2   GFR: Estimated Creatinine Clearance: 74.2 mL/min (by C-G formula based on SCr of 0.74 mg/dL). Liver Function Tests: Recent Labs  Lab 10/08/23 0632  AST 15  ALT 15  ALKPHOS 71  BILITOT 0.5  PROT 6.6  ALBUMIN 2.6*   No  results for input(s): LIPASE, AMYLASE in the last 168 hours. No results for input(s): AMMONIA in the last 168 hours. Coagulation Profile: No results for input(s): INR, PROTIME in the last 168 hours. Cardiac Enzymes: No results for input(s): CKTOTAL, CKMB, CKMBINDEX, TROPONINI in the last 168 hours. BNP (last 3 results) No results for input(s): PROBNP in the last 8760 hours. HbA1C: No results for input(s): HGBA1C in the last 72 hours. CBG: No results for input(s): GLUCAP in the last 168 hours. Lipid Profile: No results for input(s): CHOL, HDL, LDLCALC, TRIG, CHOLHDL, LDLDIRECT in the last 72 hours. Thyroid Function Tests: No results for input(s): TSH, T4TOTAL, FREET4, T3FREE, THYROIDAB in the last 72 hours. Anemia Panel: No results for input(s): VITAMINB12, FOLATE,  FERRITIN, TIBC, IRON, RETICCTPCT in the last 72 hours. Sepsis Labs: Recent Labs  Lab 10/08/23 0830  PROCALCITON <0.10   Recent Results (from the past 240 hours)  MRSA Next Gen by PCR, Nasal     Status: None   Collection Time: 10/08/23  4:04 PM   Specimen: Nasal Mucosa; Nasal Swab  Result Value Ref Range Status   MRSA by PCR Next Gen NOT DETECTED NOT DETECTED Final    Comment: (NOTE) The GeneXpert MRSA Assay (FDA approved for NASAL specimens only), is one component of a comprehensive MRSA colonization surveillance program. It is not intended to diagnose MRSA infection nor to guide or monitor treatment for MRSA infections. Test performance is not FDA approved in patients less than 84 years old. Performed at [facility], [address] [place], [place] [address]     Radiology Studies: DG Chest Portable 1 View Result Date: 10/08/2023 CLINICAL DATA:  Shortness of breath. EXAM: PORTABLE CHEST 1 VIEW COMPARISON:  09/30/2023 FINDINGS: Marked hyperexpansion. Emphysema with diffuse bullous changes, similar to prior. No dense focal consolidation although patchy airspace disease in the medial right base is similar to prior. No substantial pleural effusion. The cardiopericardial silhouette is within normal limits for size. No acute bony abnormality. Telemetry leads overlie the chest. IMPRESSION: Marked hyperexpansion with emphysema and diffuse bullous changes. Patchy airspace disease in the medial right base is similar to prior. Electronically Signed   By: Camellia Candle M.D.   On: 10/08/2023 07:12   Scheduled Meds:  apixaban   5 mg Oral BID   arformoterol   15 mcg Nebulization BID   atorvastatin   10 mg Oral QHS   benztropine   2 mg Oral BID   budesonide   0.25 mg Nebulization BID   carvedilol   6.25 mg Oral BID WC   feeding supplement (GLUCERNA SHAKE)  237 mL Oral TID BM   guaiFENesin   1,200 mg Oral BID   ipratropium-albuterol   3 mL Nebulization BID   lidocaine   1 patch Transdermal  Q24H   loratadine   10 mg Oral Daily   mirtazapine   30 mg Oral QHS   multivitamin with minerals  1 tablet Oral Daily   polyethylene glycol  17 g Oral Daily   predniSONE   40 mg Oral Q breakfast   risperiDONE   3 mg Oral BID   sodium chloride  flush  3 mL Intravenous Q12H   tamsulosin   0.4 mg Oral QPC supper   Continuous Infusions:  cefTRIAXone  (ROCEPHIN )  IV Stopped (10/08/23 2132)    LOS: 1 day   Blake Marker, DO [facility] Available via Epic secure chat 7am-7pm After these hours, please refer to coverage provider listed on [email or web address] 10/09/2023, 6:14 PM

## 2023-10-09 NOTE — Progress Notes (Signed)
 Pt in chair after getting bath and linen changed. Mobility working with pt and returned to bed. Sister [name] called for update. Pt resting in bed with shortness of breath from activity. Respiratory called and breathing treatment requested.  Pt aware and comfortable 96 % 2L [place] , 22, 103. Sister wanting to confirm patient is getting breathing treatments every four hours. Ann Nena Hoard, RN

## 2023-10-09 NOTE — Plan of Care (Signed)
   Problem: Clinical Measurements: Goal: Ability to maintain clinical measurements within normal limits will improve Outcome: Progressing

## 2023-10-09 NOTE — Hospital Course (Addendum)
 Patient is a 68 year old African-American male with past medical history significant for Mannam to essential hypertension, hyperlipidemia, COPD with chronic respiratory failure, history of lung cancer status post SBRT, diabetes mellitus type 2, schizophrenia and other comorbidities who presents with worsening shortness of breath.  He had worsening shortness of breath over the last few days and had tried coughing up phlegm and attempted to manage his symptoms with his normal inhalers.  He denies radiating symptoms.  Given his respiratory distress he is brought to the ED and noted to be satting 54% on 4 L supplemental oxygen .  In the ED he was given Solu-Medrol  1.5 mg, DuoNebs and magnesium  sulfate.  Recently hospitalized from [phone] with acute on chronic respiratory failure apparently was concern for acute on chronic right-sided pulmonary embolism with moderate clot burden.  He was initiated on Lovenox  but then transition to apixaban .  Also notably he has been recently admitted for acute on chronic respiratory failure in the setting of COPD exacerbations in last few months.  Respiratory Status is improving slowly now but still wheezing and has some rhonchi.  Assessment and Plan:  Acute on chronic respiratory failure with hypoxia in the setting of COPD exacerbation:  Patient presents after being unable to cough up sputum this morning despite receiving breathing treatment.  Chest x-ray noting a similar patchy airspace disease of the right medial lung base.  O2 saturations were reported to be as low as 54% on 4 L of nasal cannula oxygen  for which patient was placed on BiPAP with improvement in work of breathing.  Patient had been given Solu-Medrol  IV, 2 DuoNeb breathing treatments, and magnesium  sulfate in the ED -Admitted to a progressive bed -Continuous pulse oximetry with oxygen  to maintain O2 saturations -BiPAP as needed -Procalcitonin was <0.10 -Incentive spirometry and flutter valve once  able -ChangeDuoNebs scheduled (BID) to Xopenex /Atrovent ; C/w Arformoterol  15 mcg nebs twice daily and Budesonide  0.25 mg nebs twice daily -Change Prednisone  40 mg po Daily to IV Methylprednisolone  60 mg q12h -Continue Empiric antibiotics of IV Ceftriaxone  -Increased guaifenesin  to 1200 mL p.o. twice daily, continue flutter valve, incentive spirometry -Will need an ambulatory home O2 screen prior to discharge and will need to monitor respiratory status carefully. Patient requires 2 Liters of Supplemental O2 when Ambulating per PT but this is his baseline -Repeat CX this AM done and showed Bullous emphysema and no acute findings    History of PE: Patient had just recently been found to have acute on chronic pulmonary embolism when last checked on 7/16.  Echocardiogram noted no signs for right heart strain and patient was started on Apixaban ; continue with Apixaban  5 mg p.o. twice daily   Lung Cancer: Patient with a right lower lobe mass and presumed to be NSCLC. Not a candidate for EBUS; s/p radiation rx about 1 year ago-primarily followed by Dr. Dante of pulmonology.  It appears that the patient's sister has had talks with palliative.  Leukocytosis: In the setting of Steroid Demargination. WBC went from 9.4 -> 14.8. CTM for S/Sx of Infection. 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  No further workup was recommended as patient was asymptomatic, but thought likely cancerous in nature.   Schizophrenia: Continue Benztropine  2 mg p.o. twice daily, Risperidone  3 mg p.o. twice daily, and Mirtazapine  30 mg p.o. nightly   Hyperlipidemia: C/w Atorvastatin  10 mg po qHS   BPH: C/w Tamsulosin  0.4 mg  po qSupper   Normocytic Anemia: Hgb/Hct went from 12.6/38.2 -> 11.7/35.9 -> 11.2/34.3. Check Anemia Panel in the AM. CTM for S/Sx of Bleeding; No overt bleeding noted. Repeat CBC in the AM  Hypoalbuminemia: Patient's Albumin Lvl  went from 2.7 -> 2.6 -> 2.5. CTM and Trend and repeat CMP in the AM

## 2023-10-09 NOTE — Plan of Care (Signed)

## 2023-10-09 NOTE — TOC CM/SW Note (Signed)
 Transition of Care [facility]) - Inpatient Brief Assessment   Patient Details  Name: LADARREN STEINER MRN: [id number] Date of Birth: 12-Nov-1955  [facility] CM/SW Contact:    Lauraine FORBES Saa, LCSW Phone Number: 10/09/2023, 12:10 PM   Clinical Narrative:  12:10 PM Per chart review, patient resides at home with relatives. Patient has insurance and does not have a PCP but is followed by Pulmonology. Patient does not have SNF history. Patient has HH history with [facility] and [facility]. Patient has DME (cane, BSC, rolling walker, shower stool, oxygen ) history with [facility]. Patient has outpatient palliative care history with [facility]. Patient's preferred pharmacy's are [facility], [facility], and [facility] [phone] [facility]. CSW provided SDOH (social connections) resources. No TOC needs were identified at this time. TOC will continue to follow and be available to assist.  Transition of Care Asessment: Insurance and Status: Insurance coverage has been reviewed Patient has primary care physician: No (Followed by Pulmonology) Home environment has been reviewed: Private Residence Prior level of function:: N/A Prior/Current Home Services: No current home services (Has HH/DME history) Social Drivers of Health Review: SDOH reviewed interventions complete Readmission risk has been reviewed: Yes Transition of care needs: no transition of care needs at this time

## 2023-10-10 ENCOUNTER — Inpatient Hospital Stay (HOSPITAL_COMMUNITY)

## 2023-10-10 DIAGNOSIS — J9621 Acute and chronic respiratory failure with hypoxia: Secondary | ICD-10-CM | POA: Diagnosis not present

## 2023-10-10 DIAGNOSIS — J441 Chronic obstructive pulmonary disease with (acute) exacerbation: Secondary | ICD-10-CM | POA: Diagnosis not present

## 2023-10-10 DIAGNOSIS — Z86711 Personal history of pulmonary embolism: Secondary | ICD-10-CM | POA: Diagnosis not present

## 2023-10-10 DIAGNOSIS — Z85118 Personal history of other malignant neoplasm of bronchus and lung: Secondary | ICD-10-CM | POA: Diagnosis not present

## 2023-10-10 LAB — COMPREHENSIVE METABOLIC PANEL WITH GFR
ALT: 17 U/L (ref 0–44)
AST: 16 U/L (ref 15–41)
Albumin: 2.5 g/dL — ABNORMAL LOW (ref 3.5–5.0)
Alkaline Phosphatase: 57 U/L (ref 38–126)
Anion gap: 14 (ref 5–15)
BUN: 10 mg/dL (ref 8–23)
CO2: 29 mmol/L (ref 22–32)
Calcium: 9.1 mg/dL (ref 8.9–10.3)
Chloride: 96 mmol/L — ABNORMAL LOW (ref 98–111)
Creatinine, Ser: 0.63 mg/dL (ref 0.61–1.24)
GFR, Estimated: >60 mL/min (ref >60)
Glucose, Bld: 87 mg/dL (ref 70–99)
Potassium: 3.7 mmol/L (ref 3.5–5.1)
Sodium: 139 mmol/L (ref 135–145)
Total Bilirubin: 0.2 mg/dL (ref 0.0–1.2)
Total Protein: 6.3 g/dL — ABNORMAL LOW (ref 6.5–8.1)

## 2023-10-10 LAB — CBC WITH DIFFERENTIAL/PLATELET
Abs Immature Granulocytes: 0.09 K/uL — ABNORMAL HIGH (ref 0.00–0.07)
Basophils Absolute: 0 K/uL (ref 0.0–0.1)
Basophils Relative: 0 %
Eosinophils Absolute: 0 K/uL (ref 0.0–0.5)
Eosinophils Relative: 0 %
HCT: 34.3 % — ABNORMAL LOW (ref 39.0–52.0)
Hemoglobin: 11.2 g/dL — ABNORMAL LOW (ref 13.0–17.0)
Immature Granulocytes: 1 %
Lymphocytes Relative: 11 %
Lymphs Abs: 1.6 K/uL (ref 0.7–4.0)
MCH: 27.1 pg (ref 26.0–34.0)
MCHC: 32.7 g/dL (ref 30.0–36.0)
MCV: 82.9 fL (ref 80.0–100.0)
Monocytes Absolute: 2 K/uL — ABNORMAL HIGH (ref 0.1–1.0)
Monocytes Relative: 13 %
Neutro Abs: 11.1 K/uL — ABNORMAL HIGH (ref 1.7–7.7)
Neutrophils Relative %: 75 %
Platelets: 329 K/uL (ref 150–400)
RBC: 4.14 MIL/uL — ABNORMAL LOW (ref 4.22–5.81)
RDW: 15 % (ref 11.5–15.5)
WBC: 14.8 K/uL — ABNORMAL HIGH (ref 4.0–10.5)
nRBC: 0 % (ref 0.0–0.2)

## 2023-10-10 LAB — MAGNESIUM: Magnesium: 2 mg/dL (ref 1.7–2.4)

## 2023-10-10 LAB — PHOSPHORUS: Phosphorus: 2.5 mg/dL (ref 2.5–4.6)

## 2023-10-10 MED ORDER — LEVALBUTEROL HCL 0.63 MG/3ML IN NEBU
0.6300 mg | INHALATION_SOLUTION | Freq: Four times a day (QID) | RESPIRATORY_TRACT | Status: DC
  Administered 2023-10-10 – 2023-10-11 (×5): 0.63 mg via RESPIRATORY_TRACT
  Filled 2023-10-10 (×5): qty 3

## 2023-10-10 MED ORDER — METHYLPREDNISOLONE SODIUM SUCC 125 MG IJ SOLR
60.0000 mg | Freq: Two times a day (BID) | INTRAMUSCULAR | Status: DC
  Administered 2023-10-10 – 2023-10-11 (×3): 60 mg via INTRAVENOUS
  Filled 2023-10-10 (×3): qty 2

## 2023-10-10 MED ORDER — IPRATROPIUM BROMIDE 0.02 % IN SOLN
0.5000 mg | Freq: Four times a day (QID) | RESPIRATORY_TRACT | Status: DC
  Administered 2023-10-10 – 2023-10-11 (×4): 0.5 mg via RESPIRATORY_TRACT
  Filled 2023-10-10 (×4): qty 2.5

## 2023-10-10 NOTE — Evaluation (Signed)
 Physical Therapy Evaluation & Discharge Patient Details Name: Blake Mcknight MRN: [id number] DOB: [birth year] Today's Date: 10/10/2023  History of Present Illness  Pt is 68 y.o. admitted on 10/07/33 for respiratory distress; workup for COPD exacerbation. Recent admissions for PE, COPD exacerbations. PMH includes COPD (2L O2 baseline), lung CA s/p SBRT, HTN, HLD, schizophrenia, DM II.  Clinical Impression  Patient evaluated by Physical Therapy with no further acute PT needs identified. PTA, pt indep household ambulator limited by baseline SOB; pt lives with sister who assists with iADLs and driving. Today, pt able to ambulate short distances with seated rest breaks to recover SOB; SpO2 94% on 2L O2 [place] with activity, which pt wears baseline. Educ re: energy conservation strategies, activity recommendations. All education has been completed and the patient has no further questions. Acute PT is signing off. Thank you for this referral.  SATURATION QUALIFICATIONS: (This note is used to comply with regulatory documentation for home oxygen ) Patient Saturations on Room Air at Rest = 90% Patient Saturations on Room Air while Ambulating = 86% Patient Saturations on 2 Liters of oxygen  while Ambulating = 94%    If plan is discharge home, recommend the following: A little help with bathing/dressing/bathroom;Assistance with cooking/housework;Assist for transportation   Can travel by private vehicle    Yes    Equipment Recommendations None recommended by PT  Recommendations for Other Services       Functional Status Assessment       Precautions / Restrictions Precautions Precautions: Fall;Other (comment) Precaution/Restrictions Comments: wears 2L O2 baseline Restrictions Weight Bearing Restrictions Per Provider Order: No      Mobility  Bed Mobility Overal bed mobility: Independent Bed Mobility: Supine to Sit, Sit to Supine, Rolling                Transfers Overall transfer level:  Independent Equipment used: None Transfers: Sit to/from Stand                  Ambulation/Gait Ambulation/Gait assistance: Supervision Gait Distance (Feet): 60 Feet (+ 60') Assistive device: None Gait Pattern/deviations: Step-through pattern, Decreased stride length Gait velocity: decreased     General Gait Details: slow, fairly steady gait without DME. supervision for safety and O2/line management; prolonged seated rest break in tripod position to recover SOB. pt self-limiting ambulation distance due to SOB  Stairs            Wheelchair Mobility     Tilt Bed    Modified Rankin (Stroke Patients Only)       Balance Overall balance assessment: Needs assistance Sitting-balance support: No upper extremity supported, Feet supported Sitting balance-Leahy Scale: Good     Standing balance support: No upper extremity supported, During functional activity Standing balance-Leahy Scale: Fair                               Pertinent Vitals/Pain Pain Assessment Pain Assessment: Faces Faces Pain Scale: No hurt Pain Intervention(s): Monitored during session    Home Living Family/patient expects to be discharged to:: Private residence Living Arrangements: Other relatives Available Help at Discharge: Family;Available 24 hours/day Type of Home: House Home Access: Stairs to enter Entrance Stairs-Rails: None Entrance Stairs-Number of Steps: 2   Home Layout: Two level;Able to live on main level with bedroom/bathroom Home Equipment: Wheelchair - manual;Shower seat Additional Comments: lives with sister who does her own thing    Prior Function Prior Level of Function : Needs  assist             Mobility Comments: indep without DME; limited household ambulator secondary to baseline SOB. ADLs Comments: uses shower chair for bathing, reports mod indep with this and ADLs. sister assists with iADLs, including cooking, cleaning, med management, driving      Extremity/Trunk Assessment   Upper Extremity Assessment Upper Extremity Assessment: Overall WFL for tasks assessed    Lower Extremity Assessment Lower Extremity Assessment: Overall WFL for tasks assessed       Communication        Cognition Arousal: Alert Behavior During Therapy: WFL for tasks assessed/performed   PT - Cognitive impairments: History of cognitive impairments                       PT - Cognition Comments: h/o schizophrenia. requires some repetition with questions, seemingly answering appropriately. follows simple commands appropriately. soft speech difficult to understand at times         Cueing       General Comments General comments (skin integrity, edema, etc.): SpO2 86% ambulating on RA, SpO2 94% ambulating on 2L O2 [place]. educ re: energy conservation strategies, activity recommendations (including walking at home), discharge needs    Exercises     Assessment/Plan    PT Assessment Patient does not need any further PT services  PT Problem List Decreased strength;Decreased activity tolerance;Decreased balance;Decreased mobility;Decreased cognition;Decreased knowledge of use of DME;Cardiopulmonary status limiting activity;Pain       PT Treatment Interventions      PT Goals (Current goals can be found in the Care Plan section)  Acute Rehab PT Goals PT Goal Formulation: All assessment and education complete, DC therapy    Frequency       Co-evaluation               AM-PAC PT 6 Clicks Mobility  Outcome Measure Help needed turning from your back to your side while in a flat bed without using bedrails?: None Help needed moving from lying on your back to sitting on the side of a flat bed without using bedrails?: None Help needed moving to and from a bed to a chair (including a wheelchair)?: None Help needed standing up from a chair using your arms (e.g., wheelchair or bedside chair)?: None Help needed to walk in hospital room?: A  Little Help needed climbing 3-5 steps with a railing? : A Little 6 Click Score: 22    End of Session Equipment Utilized During Treatment: Oxygen  Activity Tolerance: Patient tolerated treatment well Patient left: in bed;with call bell/phone within reach Nurse Communication: Mobility status PT Visit Diagnosis: Unsteadiness on feet (R26.81);Other abnormalities of gait and mobility (R26.89);Muscle weakness (generalized) (M62.81);Other symptoms and signs involving the nervous system (R29.898)    Time: 9189-9166 PT Time Calculation (min) (ACUTE ONLY): 23 min   Charges:   PT Evaluation $PT Eval Low Complexity: 1 Low PT Treatments $Self Care/Home Management: 8-22 PT General Charges $$ ACUTE PT VISIT: 1 Visit       Darice Almas, PT, DPT Acute Rehabilitation Services  Personal: Secure Chat Rehab Office: [phone]  Darice LITTIE Almas 10/10/2023, 9:02 AM

## 2023-10-10 NOTE — Plan of Care (Signed)
  Problem: Clinical Measurements: Goal: Will remain free from infection Outcome: Progressing   Problem: Coping: Goal: Level of anxiety will decrease Outcome: Progressing   Problem: Nutrition: Goal: Adequate nutrition will be maintained Outcome: Progressing   Problem: Safety: Goal: Ability to remain free from injury will improve Outcome: Progressing   Problem: Pain Managment: Goal: General experience of comfort will improve and/or be controlled Outcome: Progressing   Problem: Skin Integrity: Goal: Risk for impaired skin integrity will decrease Outcome: Progressing

## 2023-10-10 NOTE — Plan of Care (Signed)

## 2023-10-10 NOTE — Progress Notes (Signed)
 PROGRESS NOTE    Blake Mcknight  FMW:[id number] DOB: Jun 03, 1955 DOA: 10/08/2023 PCP: Blake Bare, MD   Brief Narrative:  Patient is a 69 year old African-American male with past medical history significant for Mann[place] to essential hypertension, hyperlipidemia, COPD with chronic respiratory failure, history of lung cancer status post SBRT, diabetes mellitus type 2, schizophrenia and other comorbidities who presents with worsening shortness of breath.  He had worsening shortness of breath over the last few days and had tried coughing up phlegm and attempted to manage his symptoms with his normal inhalers.  He denies radiating symptoms.  Given his respiratory distress he is brought to the ED and noted to be satting 54% on 4 L supplemental oxygen .  In the ED he was given Solu-Medrol  1.5 mg, DuoNebs and magnesium  sulfate.  Recently hospitalized from [phone] with acute on chronic respiratory failure apparently was concern for acute on chronic right-sided pulmonary embolism with moderate clot burden.  He was initiated on Lovenox  but then transition to apixaban .  Also notably he has been recently admitted for acute on chronic respiratory failure in the setting of COPD exacerbations in last few months.  Respiratory Status is improving slowly now but still wheezing and has some rhonchi.  Assessment and Plan:  Acute on chronic respiratory failure with hypoxia in the setting of COPD exacerbation:  Patient presents after being unable to cough up sputum this morning despite receiving breathing treatment.  Chest x-ray noting a similar patchy airspace disease of the right medial lung base.  O2 saturations were reported to be as low as 54% on 4 L of nasal cannula oxygen  for which patient was placed on BiPAP with improvement in work of breathing.  Patient had been given Solu-Medrol  IV, 2 DuoNeb breathing treatments, and magnesium  sulfate in the ED -Admitted to a progressive bed -Continuous pulse oximetry  with oxygen  to maintain O2 saturations -BiPAP as needed -Procalcitonin was <0.10 -Incentive spirometry and flutter valve once able -ChangeDuoNebs scheduled (BID) to Xopenex /Atrovent ; C/w Arformoterol  15 mcg nebs twice daily and Budesonide  0.25 mg nebs twice daily -Change Prednisone  40 mg po Daily to IV Methylprednisolone  60 mg q12h -Continue Empiric antibiotics of IV Ceftriaxone  -Increased guaifenesin  to 1200 mL p.o. twice daily, continue flutter valve, incentive spirometry -Will need an ambulatory home O2 screen prior to discharge and will need to monitor respiratory status carefully. Patient requires 2 Liters of Supplemental O2 when Ambulating per PT but this is his baseline -Repeat CX this AM done and showed Bullous emphysema and no acute findings    History of PE: Patient had just recently been found to have acute on chronic pulmonary embolism when last checked on 7/16.  Echocardiogram noted no signs for right heart strain and patient was started on Apixaban ; continue with Apixaban  5 mg p.o. twice daily   Lung Cancer: Patient with a right lower lobe mass and presumed to be NSCLC. Not a candidate for EBUS; s/p radiation rx about 1 year ago-primarily followed by Dr. Dante of pulmonology.  It appears that the patient's sister has had talks with palliative.  Leukocytosis: In the setting of Steroid Demargination. WBC went from 9.4 -> 14.8. CTM for S/Sx of Infection. 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  No further workup was recommended as patient was asymptomatic, but thought likely cancerous in nature.   Schizophrenia: Continue Benztropine  2 mg p.o. twice daily, Risperidone  3 mg p.o. twice  daily, and Mirtazapine  30 mg p.o. nightly   Hyperlipidemia: C/w Atorvastatin  10 mg po qHS   BPH: C/w Tamsulosin  0.4 mg po qSupper   Normocytic Anemia: Hgb/Hct went from 12.6/38.2 -> 11.7/35.9 -> 11.2/34.3. Check Anemia  Panel in the AM. CTM for S/Sx of Bleeding; No overt bleeding noted. Repeat CBC in the AM  Hypoalbuminemia: Patient's Albumin Lvl went from 2.7 -> 2.6 -> 2.5. CTM and Trend and repeat CMP in the AM   DVT prophylaxis:  apixaban  (ELIQUIS ) tablet 5 mg    Code Status: Limited: Do not attempt resuscitation (DNR) -DNR-LIMITED -Do Not Intubate/DNI  Family Communication: No family present @ Bedside   Disposition Plan:  Level of care: Progressive Status is: Inpatient Remains inpatient appropriate because: Needs further clinical improvement in his respiratory status prior to discharging   Consultants:  None  Procedures:  As delineated as above  Antimicrobials:  Anti-infectives (From admission, onward)    Start     Dose/Rate Route Frequency Ordered Stop   10/08/23 2030  cefTRIAXone  (ROCEPHIN ) 2 g in sodium chloride  0.9 % 100 mL IVPB        2 g 200 mL/hr over 30 Minutes Intravenous Every 24 hours 10/08/23 1941         Subjective: Seen and examined at bedside and he thinks his respiratory status is doing a little bit better.  No nausea or vomiting.  More awake and alert today compared to yesterday.  No other concerns or complaints at this time.  Objective: Vitals:   10/10/23 1309 10/10/23 1658 10/10/23 1700 10/10/23 1737  BP:  (!) 149/89 (!) 149/89 (!) 149/89  Pulse:  (!) 101 100 (!) 102  Resp:  18 18   Temp:  98 F (36.7 C)    TempSrc:  Oral    SpO2: 100% 99% 96%   Weight:      Height:        Intake/Output Summary (Last 24 hours) at 10/10/2023 2002 Last data filed at 10/10/2023 1750 Gross per 24 hour  Intake 1343 ml  Output 3060 ml  Net -1717 ml   Filed Weights   10/08/23 0634  Weight: 57 kg   Examination: Physical Exam:  Constitutional: Thin elderly chronically ill-appearing African-American male who is awake and alert today Respiratory: Diminished to auscultation bilaterally with some coarse breath sounds, wheezing and some rhonchi.  No appreciable rales or crackles.   Has a normal respiratory effort and is wearing supplemental oxygen  via nasal cannula at 2 L and has unlabored breathing. Cardiovascular: RRR, no murmurs / rubs / gallops. S1 and S2 auscultated. No extremity edema.  Abdomen: Soft, non-tender, non-distended. Bowel sounds positive.  GU: Deferred. Musculoskeletal: No clubbing / cyanosis of digits/nails. No joint deformity upper and lower extremities.  Skin: No rashes, lesions, ulcers limited skin evaluation. No induration; Warm and dry.  Neurologic: CN 2-12 grossly intact with no focal deficits. Romberg sign and cerebellar reflexes not assessed.  Psychiatric: Awake and alert and appears calm  Data Reviewed: I have personally reviewed following labs and imaging studies  CBC: Recent Labs  Lab 10/08/23 0632 10/09/23 0351 10/10/23 0247  WBC 10.6* 9.4 14.8*  NEUTROABS 5.7  --  11.1*  HGB 12.6* 11.7* 11.2*  HCT 38.2* 35.9* 34.3*  MCV 83.4 82.3 82.9  PLT 329 318 329   Basic Metabolic Panel: Recent Labs  Lab 10/08/23 0632 10/09/23 0351 10/10/23 0247  NA 136 138 139  K 3.7 4.1 3.7  CL 98 98 96*  CO2 26  29 29  GLUCOSE 171* 137* 87  BUN 5* 7* 10  CREATININE 0.68 0.74 0.63  CALCIUM  9.1 9.2 9.1  MG  --   --  2.0  PHOS  --   --  2.5   GFR: Estimated Creatinine Clearance: 74.2 mL/min (by C-G formula based on SCr of 0.63 mg/dL). Liver Function Tests: Recent Labs  Lab 10/08/23 9367 10/10/23 0247  AST 15 16  ALT 15 17  ALKPHOS 71 57  BILITOT 0.5 0.2  PROT 6.6 6.3*  ALBUMIN 2.6* 2.5*   No results for input(s): LIPASE, AMYLASE in the last 168 hours. No results for input(s): AMMONIA in the last 168 hours. Coagulation Profile: No results for input(s): INR, PROTIME in the last 168 hours. Cardiac Enzymes: No results for input(s): CKTOTAL, CKMB, CKMBINDEX, TROPONINI in the last 168 hours. BNP (last 3 results) No results for input(s): PROBNP in the last 8760 hours. HbA1C: No results for input(s): HGBA1C in the  last 72 hours. CBG: No results for input(s): GLUCAP in the last 168 hours. Lipid Profile: No results for input(s): CHOL, HDL, LDLCALC, TRIG, CHOLHDL, LDLDIRECT in the last 72 hours. Thyroid Function Tests: No results for input(s): TSH, T4TOTAL, FREET4, T3FREE, THYROIDAB in the last 72 hours. Anemia Panel: No results for input(s): VITAMINB12, FOLATE, FERRITIN, TIBC, IRON, RETICCTPCT in the last 72 hours. Sepsis Labs: Recent Labs  Lab 10/08/23 0830  PROCALCITON <0.10    Recent Results (from the past 240 hours)  MRSA Next Gen by PCR, Nasal     Status: None   Collection Time: 10/08/23  4:04 PM   Specimen: Nasal Mucosa; Nasal Swab  Result Value Ref Range Status   MRSA by PCR Next Gen NOT DETECTED NOT DETECTED Final    Comment: (NOTE) The GeneXpert MRSA Assay (FDA approved for NASAL specimens only), is one component of a comprehensive MRSA colonization surveillance program. It is not intended to diagnose MRSA infection nor to guide or monitor treatment for MRSA infections. Test performance is not FDA approved in patients less than 58 years old. Performed at [facility], [address], [place], [place] [address]     Radiology Studies: DG CHEST PORT 1 VIEW Result Date: 10/10/2023 CLINICAL DATA:  [id number] SOB (shortness of breath) [id number] EXAM: PORTABLE CHEST - 1 VIEW COMPARISON:  10/08/2023 FINDINGS: Stable changes of bullous emphysema. No new infiltrate or overt edema. Heart size and mediastinal contours are within normal limits. No effusion. Visualized bones unremarkable. IMPRESSION: Bullous emphysema. No acute findings. Electronically Signed   By: JONETTA Faes M.D.   On: 10/10/2023 07:57   Scheduled Meds:  apixaban   5 mg Oral BID   arformoterol   15 mcg Nebulization BID   atorvastatin   10 mg Oral QHS   benztropine   2 mg Oral BID   budesonide   0.25 mg Nebulization BID   carvedilol   6.25 mg Oral BID WC   feeding supplement (GLUCERNA SHAKE)   237 mL Oral TID BM   guaiFENesin   1,200 mg Oral BID   ipratropium  0.5 mg Nebulization Q6H   levalbuterol   0.63 mg Nebulization Q6H   lidocaine   1 patch Transdermal Q24H   loratadine   10 mg Oral Daily   methylPREDNISolone  (SOLU-MEDROL ) injection  60 mg Intravenous Q12H   mirtazapine   30 mg Oral QHS   multivitamin with minerals  1 tablet Oral Daily   polyethylene glycol  17 g Oral Daily   risperiDONE   3 mg Oral BID   sodium chloride  flush  3 mL Intravenous Q12H   tamsulosin   0.4 mg Oral QPC supper   Continuous Infusions:  cefTRIAXone  (ROCEPHIN )  IV 2 g (10/09/23 2012)    LOS: 2 days   Alejandro Marker, DO [facility] Available via Epic secure chat 7am-7pm After these hours, please refer to coverage provider listed on [email or web address] 10/10/2023, 8:02 PM

## 2023-10-10 NOTE — Progress Notes (Signed)
 OT Cancellation Note  Patient Details Name: Blake Mcknight MRN: [id number] DOB: 1955-08-14   Cancelled Treatment:    Reason Eval/Treat Not Completed: OT screened, no needs identified, will sign off. Per conservation with PT, pt at baseline with ADLs. No further acute OT needs, OT is signing off on this pt.  Mavrick Mcquigg C, OT  Acute Rehabilitation Services Office [phone] Secure chat preferred   Blake Mcknight Savers 10/10/2023, 9:20 AM

## 2023-10-10 NOTE — Progress Notes (Signed)
 Mobility Specialist Progress Note:    10/10/23 1118  Mobility  Activity Stood at bedside  Level of Assistance Independent  Assistive Device None  Activity Response Tolerated well  Mobility Referral Yes  Mobility visit 1 Mobility  Mobility Specialist Start Time (ACUTE ONLY) 1118  Mobility Specialist Stop Time (ACUTE ONLY) 1126  Mobility Specialist Time Calculation (min) (ACUTE ONLY) 8 min   Pt received in bed, declined ambulation or chair transfer despite max encouragement. Pt eventually agreeable to demonstrate 3 STS and dangling EOB. Declined further mobility. Left pt in room, all needs met.  Lizzete Gough Mobility Specialist Please contact via Special educational needs teacher or  Rehab office at [phone]

## 2023-10-11 ENCOUNTER — Inpatient Hospital Stay (HOSPITAL_COMMUNITY)

## 2023-10-11 DIAGNOSIS — J9621 Acute and chronic respiratory failure with hypoxia: Secondary | ICD-10-CM | POA: Diagnosis not present

## 2023-10-11 DIAGNOSIS — J441 Chronic obstructive pulmonary disease with (acute) exacerbation: Secondary | ICD-10-CM | POA: Diagnosis not present

## 2023-10-11 DIAGNOSIS — Z85118 Personal history of other malignant neoplasm of bronchus and lung: Secondary | ICD-10-CM | POA: Diagnosis not present

## 2023-10-11 DIAGNOSIS — Z86711 Personal history of pulmonary embolism: Secondary | ICD-10-CM | POA: Diagnosis not present

## 2023-10-11 LAB — CBC WITH DIFFERENTIAL/PLATELET
Abs Immature Granulocytes: 0.14 K/uL — ABNORMAL HIGH (ref 0.00–0.07)
Basophils Absolute: 0 K/uL (ref 0.0–0.1)
Basophils Relative: 0 %
Eosinophils Absolute: 0 K/uL (ref 0.0–0.5)
Eosinophils Relative: 0 %
HCT: 35.3 % — ABNORMAL LOW (ref 39.0–52.0)
Hemoglobin: 11.6 g/dL — ABNORMAL LOW (ref 13.0–17.0)
Immature Granulocytes: 1 %
Lymphocytes Relative: 8 %
Lymphs Abs: 1.2 K/uL (ref 0.7–4.0)
MCH: 27 pg (ref 26.0–34.0)
MCHC: 32.9 g/dL (ref 30.0–36.0)
MCV: 82.1 fL (ref 80.0–100.0)
Monocytes Absolute: 0.9 K/uL (ref 0.1–1.0)
Monocytes Relative: 6 %
Neutro Abs: 12.7 K/uL — ABNORMAL HIGH (ref 1.7–7.7)
Neutrophils Relative %: 85 %
Platelets: 303 K/uL (ref 150–400)
RBC: 4.3 MIL/uL (ref 4.22–5.81)
RDW: 14.7 % (ref 11.5–15.5)
WBC: 15 K/uL — ABNORMAL HIGH (ref 4.0–10.5)
nRBC: 0 % (ref 0.0–0.2)

## 2023-10-11 LAB — COMPREHENSIVE METABOLIC PANEL WITH GFR
ALT: 20 U/L (ref 0–44)
AST: 18 U/L (ref 15–41)
Albumin: 2.6 g/dL — ABNORMAL LOW (ref 3.5–5.0)
Alkaline Phosphatase: 63 U/L (ref 38–126)
Anion gap: 10 (ref 5–15)
BUN: 7 mg/dL — ABNORMAL LOW (ref 8–23)
CO2: 27 mmol/L (ref 22–32)
Calcium: 8.9 mg/dL (ref 8.9–10.3)
Chloride: 102 mmol/L (ref 98–111)
Creatinine, Ser: 0.63 mg/dL (ref 0.61–1.24)
GFR, Estimated: >60 mL/min (ref >60)
Glucose, Bld: 173 mg/dL — ABNORMAL HIGH (ref 70–99)
Potassium: 3.7 mmol/L (ref 3.5–5.1)
Sodium: 139 mmol/L (ref 135–145)
Total Bilirubin: 0.3 mg/dL (ref 0.0–1.2)
Total Protein: 6.6 g/dL (ref 6.5–8.1)

## 2023-10-11 LAB — IRON AND TIBC
Iron: 52 ug/dL (ref 45–182)
Saturation Ratios: 22 % (ref 17.9–39.5)
TIBC: 237 ug/dL — ABNORMAL LOW (ref 250–450)
UIBC: 185 ug/dL

## 2023-10-11 LAB — RETICULOCYTES
Immature Retic Fract: 10.6 % (ref 2.3–15.9)
RBC.: 4.29 MIL/uL (ref 4.22–5.81)
Retic Count, Absolute: 40.3 K/uL (ref 19.0–186.0)
Retic Ct Pct: 0.9 % (ref 0.4–3.1)

## 2023-10-11 LAB — FERRITIN: Ferritin: 205 ng/mL (ref 24–336)

## 2023-10-11 LAB — VITAMIN B12: Vitamin B-12: 408 pg/mL (ref 180–914)

## 2023-10-11 LAB — PHOSPHORUS: Phosphorus: 2.5 mg/dL (ref 2.5–4.6)

## 2023-10-11 LAB — FOLATE: Folate: 10.9 ng/mL (ref >5.9)

## 2023-10-11 LAB — MAGNESIUM: Magnesium: 1.9 mg/dL (ref 1.7–2.4)

## 2023-10-11 MED ORDER — METHYLPREDNISOLONE SODIUM SUCC 125 MG IJ SOLR
60.0000 mg | INTRAMUSCULAR | Status: DC
  Administered 2023-10-12: 60 mg via INTRAVENOUS
  Filled 2023-10-11: qty 2

## 2023-10-11 MED ORDER — DOXYCYCLINE HYCLATE 100 MG PO TABS
100.0000 mg | ORAL_TABLET | Freq: Two times a day (BID) | ORAL | Status: DC
  Administered 2023-10-11 – 2023-10-12 (×2): 100 mg via ORAL
  Filled 2023-10-11 (×2): qty 1

## 2023-10-11 MED ORDER — HYDROXYZINE HCL 25 MG PO TABS: 25.0000 mg | ORAL_TABLET | Freq: Three times a day (TID) | ORAL | Status: DC | PRN

## 2023-10-11 MED ORDER — LEVALBUTEROL HCL 0.63 MG/3ML IN NEBU
0.6300 mg | INHALATION_SOLUTION | Freq: Four times a day (QID) | RESPIRATORY_TRACT | Status: DC
  Administered 2023-10-11 – 2023-10-12 (×2): 0.63 mg via RESPIRATORY_TRACT
  Filled 2023-10-11 (×2): qty 3

## 2023-10-11 MED ORDER — IPRATROPIUM BROMIDE 0.02 % IN SOLN: 0.5000 mg | RESPIRATORY_TRACT | Status: DC

## 2023-10-11 MED ORDER — LEVALBUTEROL HCL 0.63 MG/3ML IN NEBU: 0.6300 mg | INHALATION_SOLUTION | RESPIRATORY_TRACT | Status: DC

## 2023-10-11 MED ORDER — IPRATROPIUM BROMIDE 0.02 % IN SOLN
0.5000 mg | Freq: Four times a day (QID) | RESPIRATORY_TRACT | Status: DC
  Administered 2023-10-11 – 2023-10-12 (×2): 0.5 mg via RESPIRATORY_TRACT
  Filled 2023-10-11 (×2): qty 2.5

## 2023-10-11 NOTE — Progress Notes (Signed)
 RT note. Patient placed on bipap at this timer per patient request. Patient had been asking for breathing treatments and saying he is having a hard time breath. No labored breathing noted, pt. Sat 96% on rm air. RT will continue to monitor.    10/11/23 1620  BiPAP/CPAP/SIPAP  $ Non-Invasive Ventilator  Non-Invasive Vent Set Up;Non-Invasive Vent Initial  $ Face Mask Medium Yes  BiPAP/CPAP/SIPAP Pt Type Adult  BiPAP/CPAP/SIPAP DREAMSTATIOND  Mask Type Full face mask  Mask Size Medium  Respiratory Rate 18 breaths/min  IPAP 10 cmH20  EPAP 5 cmH2O  PEEP 5 cmH20  FiO2 (%) 21 %  Patient Home Machine No  Patient Home Mask No  Patient Home Tubing No  Auto Titrate No  BiPAP/CPAP /SiPAP Vitals  Pulse Rate 96  SpO2 96 %  MEWS Score/Color  MEWS Score 0  MEWS Score Color [place]

## 2023-10-11 NOTE — Progress Notes (Signed)
 PROGRESS NOTE    Blake Mcknight  FMW:[id number] DOB: 1955/08/23 DOA: 10/08/2023 PCP: [name], MD   Brief Narrative:  Patient is a 68 year old African-American male with past medical history significant for Mannam to essential hypertension, hyperlipidemia, COPD with chronic respiratory failure, history of lung cancer status post SBRT, diabetes mellitus type 2, schizophrenia and other comorbidities who presents with worsening shortness of breath.  He had worsening shortness of breath over the last few days and had tried coughing up phlegm and attempted to manage his symptoms with his normal inhalers.  He denies radiating symptoms.  Given his respiratory distress he is brought to the ED and noted to be satting 54% on 4 L supplemental oxygen .  In the ED he was given Solu-Medrol  1.5 mg, DuoNebs and magnesium  sulfate.  Recently hospitalized from [phone] with acute on chronic respiratory failure apparently was concern for acute on chronic right-sided pulmonary embolism with moderate clot burden.  He was initiated on Lovenox  but then transition to apixaban .  Also notably he has been recently admitted for acute on chronic respiratory failure in the setting of COPD exacerbations in last few months.  Respiratory Status is improving slowly now but still wheezing and has some rhonchi. Will do an Ambulatory Home O2 Screen in the Am and anticipating D/C in the next 24 hours.   Assessment and Plan:  Acute on Chronic Respiratory Failure with hypoxia in the setting of COPD exacerbation:  Patient presents after being unable to cough up sputum this morning despite receiving breathing treatment.  Chest x-ray noting a similar patchy airspace disease of the right medial lung base.  O2 saturations were reported to be as low as 54% on 4 L of nasal cannula oxygen  for which patient was placed on BiPAP with improvement in work of breathing.  Patient had been given Solu-Medrol  IV, 2 DuoNeb breathing treatments,  and magnesium  sulfate in the ED -Admitted to a progressive bed -Continuous pulse oximetry with oxygen  to maintain O2 saturations -BiPAP as needed -Procalcitonin was <0.10 -Incentive spirometry and flutter valve once able -ChangeDuoNebs scheduled (BID) to Xopenex /Atrovent  q6h; C/w Arformoterol  15 mcg nebs twice daily and Budesonide  0.25 mg nebs twice daily -Changed Prednisone  40 mg po Daily to IV Methylprednisolone  60 mg q12h and will start weaning and go to 60 mg q24h -Continue Empiric antibiotics of IV Ceftriaxone  for now and will also add Doxycycline   -Increased guaifenesin  to 1200 mL p.o. twice daily, continue flutter valve, incentive spirometry -Will need an Ambulatory home O2 screen prior to discharge and will need to monitor respiratory status carefully. Patient requires 2 Liters of Supplemental O2 when Ambulating per PT but this is his baseline -Repeat CX this AM done and showed Bullous emphysema and no acute findings    History of PE: Patient had just recently been found to have acute on chronic pulmonary embolism when last checked on 7/16.  Echocardiogram noted no signs for right heart strain and patient was started on Apixaban ; continue with Apixaban  5 mg p.o. twice daily   Lung Cancer: Patient with a right lower lobe mass and presumed to be NSCLC. Not a candidate for EBUS; s/p radiation rx about 1 year ago-primarily followed by Dr. Dante of pulmonology.  It appears that the patient's sister has had talks with palliative.  Leukocytosis: In the setting of Steroid Demargination. WBC went from 9.4 -> 14.8 -> 15.0. CTM for S/Sx of Infection. Repeat CBC in the AM   Duodenal Mass: Patient noted to have a mass located  at the duodenal bulb from CT obtained on 7/19.  No further workup was recommended as patient was asymptomatic, but thought likely cancerous in nature.   Schizophrenia: Continue Benztropine  2 mg p.o. twice daily, Risperidone  3 mg p.o. twice daily, and Mirtazapine  30 mg p.o.  nightly   Hyperlipidemia: C/w Atorvastatin  10 mg po qHS   BPH: C/w Tamsulosin  0.4 mg po qSupper   Normocytic Anemia: Hgb/Hct went from 12.6/38.2 -> 11.7/35.9 -> 11.2/34.3 -> 11.6/35.3. Checked Anemia Panel and showed an Fe level of 52, UIBC of 185, TIBC 237, Saturation Ratios of 22, Ferritin Level of 205, and Folate Level of 10.9. CTM for S/Sx of Bleeding; No overt bleeding noted. Repeat CBC in the AM  Hypoalbuminemia: Patient's Albumin Lvl went from 2.7 -> 2.6 -> 2.5 -> 2.6. CTM and Trend and repeat CMP in the AM   DVT prophylaxis:  apixaban  (ELIQUIS ) tablet 5 mg    Code Status: Limited: Do not attempt resuscitation (DNR) -DNR-LIMITED -Do Not Intubate/DNI  Family Communication: No family present @ bedside  Disposition Plan:  Level of care: Progressive Status is: Inpatient Remains inpatient appropriate because: Respiratory status is improving and anticipating D/C in the next 24 hours   Consultants:  None  Procedures:  As delineated as above  Antimicrobials:  Anti-infectives (From admission, onward)    Start     Dose/Rate Route Frequency Ordered Stop   10/11/23 2200  doxycycline  (VIBRA -TABS) tablet 100 mg        100 mg Oral Every 12 hours 10/11/23 1906     10/08/23 2030  cefTRIAXone  (ROCEPHIN ) 2 g in sodium chloride  0.9 % 100 mL IVPB        2 g 200 mL/hr over 30 Minutes Intravenous Every 24 hours 10/08/23 1941         Subjective: Seen and examined at bedside and he is resting and asleep. Nursing reports no issues overnight. No other concerns or complaints at this time.   Objective: Vitals:   10/11/23 1213 10/11/23 1516 10/11/23 1620 10/11/23 1645  BP: (!) 152/83   (!) 153/93  Pulse:   96 86  Resp: 20  18   Temp:      TempSrc: Oral     SpO2:  100% 96%   Weight:      Height:        Intake/Output Summary (Last 24 hours) at 10/11/2023 1908 Last data filed at 10/11/2023 1658 Gross per 24 hour  Intake 300 ml  Output 2050 ml  Net -1750 ml   Filed Weights   10/08/23  0634  Weight: 57 kg   Examination: Physical Exam:  Constitutional: Thin elderly chronically ill-appearing AAM who is resting and asleep Respiratory: Diminished to auscultation bilaterally with coarse breath sounds and has some mild wheezing and rhonchi but no appreciable rales or crackles. Normal respiratory effort and patient is not tachypenic. No accessory muscle use. Wearing Supplemental O2 via [place]  Cardiovascular: RRR, no murmurs / rubs / gallops. S1 and S2 auscultated. No extremity edema. Abdomen: Soft, non-tender, non-distended. Bowel sounds positive.  GU: Deferred. Musculoskeletal: No clubbing / cyanosis of digits/nails. No joint deformity upper and lower extremities. Skin: No rashes, lesions, ulcers on a limited skin evaluation but had a mark on his face that turned out to be a highlighter. No induration; Warm and dry.  Neurologic: Asleep and did not want to wake for encounter Psychiatric: Appears calm   Data Reviewed: I have personally reviewed following labs and imaging studies  CBC: Recent Labs  Lab 10/08/23 0632 10/09/23 0351 10/10/23 0247 10/11/23 0353  WBC 10.6* 9.4 14.8* 15.0*  NEUTROABS 5.7  --  11.1* 12.7*  HGB 12.6* 11.7* 11.2* 11.6*  HCT 38.2* 35.9* 34.3* 35.3*  MCV 83.4 82.3 82.9 82.1  PLT 329 318 329 303   Basic Metabolic Panel: Recent Labs  Lab 10/08/23 0632 10/09/23 0351 10/10/23 0247 10/11/23 0353  NA 136 138 139 139  K 3.7 4.1 3.7 3.7  CL 98 98 96* 102  CO2 26 29 29 27   GLUCOSE 171* 137* 87 173*  BUN 5* 7* 10 7*  CREATININE 0.68 0.74 0.63 0.63  CALCIUM  9.1 9.2 9.1 8.9  MG  --   --  2.0 1.9  PHOS  --   --  2.5 2.5   GFR: Estimated Creatinine Clearance: 74.2 mL/min (by C-G formula based on SCr of 0.63 mg/dL). Liver Function Tests: Recent Labs  Lab 10/08/23 0632 10/10/23 0247 10/11/23 0353  AST 15 16 18   ALT 15 17 20   ALKPHOS 71 57 63  BILITOT 0.5 0.2 0.3  PROT 6.6 6.3* 6.6  ALBUMIN 2.6* 2.5* 2.6*   No results for input(s):  LIPASE, AMYLASE in the last 168 hours. No results for input(s): AMMONIA in the last 168 hours. Coagulation Profile: No results for input(s): INR, PROTIME in the last 168 hours. Cardiac Enzymes: No results for input(s): CKTOTAL, CKMB, CKMBINDEX, TROPONINI in the last 168 hours. BNP (last 3 results) No results for input(s): PROBNP in the last 8760 hours. HbA1C: No results for input(s): HGBA1C in the last 72 hours. CBG: No results for input(s): GLUCAP in the last 168 hours. Lipid Profile: No results for input(s): CHOL, HDL, LDLCALC, TRIG, CHOLHDL, LDLDIRECT in the last 72 hours. Thyroid Function Tests: No results for input(s): TSH, T4TOTAL, FREET4, T3FREE, THYROIDAB in the last 72 hours. Anemia Panel: Recent Labs    10/11/23 0353  VITAMINB12 408  FOLATE 10.9  FERRITIN 205  TIBC 237*  IRON 52  RETICCTPCT 0.9   Sepsis Labs: Recent Labs  Lab 10/08/23 0830  PROCALCITON <0.10   Recent Results (from the past 240 hours)  MRSA Next Gen by PCR, Nasal     Status: None   Collection Time: 10/08/23  4:04 PM   Specimen: Nasal Mucosa; Nasal Swab  Result Value Ref Range Status   MRSA by PCR Next Gen NOT DETECTED NOT DETECTED Final    Comment: (NOTE) The GeneXpert MRSA Assay (FDA approved for NASAL specimens only), is one component of a comprehensive MRSA colonization surveillance program. It is not intended to diagnose MRSA infection nor to guide or monitor treatment for MRSA infections. Test performance is not FDA approved in patients less than 35 years old. Performed at [facility], [address], [place], [place] [address]     Radiology Studies: DG CHEST PORT 1 VIEW Result Date: 10/11/2023 CLINICAL DATA:  Shortness of breath. EXAM: PORTABLE CHEST 1 VIEW COMPARISON:  Chest CT dated 10/10/2023. FINDINGS: Background of emphysema. Right infrahilar opacity similar or slightly improved. No pleural effusion or pneumothorax. Stable  cardiac silhouette. No acute osseous pathology. IMPRESSION: Right infrahilar opacity similar or slightly improved. Electronically Signed   By: Vanetta Chou M.D.   On: 10/11/2023 10:49   DG CHEST PORT 1 VIEW Result Date: 10/10/2023 CLINICAL DATA:  141880 SOB (shortness of breath) [id number] EXAM: PORTABLE CHEST - 1 VIEW COMPARISON:  10/08/2023 FINDINGS: Stable changes of bullous emphysema. No new infiltrate or overt edema. Heart size and mediastinal contours are  within normal limits. No effusion. Visualized bones unremarkable. IMPRESSION: Bullous emphysema. No acute findings. Electronically Signed   By: JONETTA Faes M.D.   On: 10/10/2023 07:57   Scheduled Meds:  apixaban   5 mg Oral BID   arformoterol   15 mcg Nebulization BID   atorvastatin   10 mg Oral QHS   benztropine   2 mg Oral BID   budesonide   0.25 mg Nebulization BID   carvedilol   6.25 mg Oral BID WC   doxycycline   100 mg Oral Q12H   feeding supplement (GLUCERNA SHAKE)  237 mL Oral TID BM   guaiFENesin   1,200 mg Oral BID   ipratropium  0.5 mg Nebulization Q6H   levalbuterol   0.63 mg Nebulization Q6H   lidocaine   1 patch Transdermal Q24H   loratadine   10 mg Oral Daily   [START ON 10/12/2023] methylPREDNISolone  (SOLU-MEDROL ) injection  60 mg Intravenous Q24H   mirtazapine   30 mg Oral QHS   multivitamin with minerals  1 tablet Oral Daily   polyethylene glycol  17 g Oral Daily   risperiDONE   3 mg Oral BID   sodium chloride  flush  3 mL Intravenous Q12H   tamsulosin   0.4 mg Oral QPC supper   Continuous Infusions:  cefTRIAXone  (ROCEPHIN )  IV Stopped (10/11/23 0000)    LOS: 3 days   Alejandro Marker, DO [facility] Available via Epic secure chat 7am-7pm After these hours, please refer to coverage provider listed on [email or web address] 10/11/2023, 7:08 PM

## 2023-10-11 NOTE — Plan of Care (Signed)

## 2023-10-11 NOTE — Progress Notes (Signed)
 RT note.  This RT was approached by family member asking about patients breathing treatment. States he does and needs breathing treatments Q4. Patient has been being assessed daily by RT's.  per RT protocol patient is not scoring high enough for Q4 also sounds clear BBS. VS stables, on no oxygen .Patient has been scheduled for Q6 treatments with a Q2 prn scheduled just in case labored breathing/wheezing occurs.   MD has been made aware and agrees. RN also aware

## 2023-10-11 NOTE — Progress Notes (Signed)
 Mobility Specialist Progress Note:   10/11/23 0843  Mobility  Activity Ambulated independently in hallway;Ambulated independently in room  Level of Assistance Independent  Assistive Device None  Distance Ambulated (ft) 250 ft  Activity Response Tolerated well  Mobility Referral Yes  Mobility visit 1 Mobility  Mobility Specialist Start Time (ACUTE ONLY) 0831  Mobility Specialist Stop Time (ACUTE ONLY) 0844  Mobility Specialist Time Calculation (min) (ACUTE ONLY) 13 min   Pt received in bed, agreeable to mobility session. Ambulated in hallway independently, SpO2 91-100% on RA throughout. Audible SOB during session, pt took one standing rest break, O2 WFL. Max HR 137 bpm. Post Mobility BP 151/77 (97).  Returned pt to room, RN at bedside, all needs met.   Devonn Giampietro Mobility Specialist Please contact via Special educational needs teacher or  Rehab office at [phone]

## 2023-10-12 ENCOUNTER — Telehealth (HOSPITAL_COMMUNITY): Payer: Self-pay | Admitting: Pharmacy Technician

## 2023-10-12 ENCOUNTER — Other Ambulatory Visit (HOSPITAL_COMMUNITY): Payer: Self-pay

## 2023-10-12 ENCOUNTER — Inpatient Hospital Stay (HOSPITAL_COMMUNITY)

## 2023-10-12 DIAGNOSIS — J441 Chronic obstructive pulmonary disease with (acute) exacerbation: Secondary | ICD-10-CM | POA: Diagnosis not present

## 2023-10-12 DIAGNOSIS — Z86711 Personal history of pulmonary embolism: Secondary | ICD-10-CM | POA: Diagnosis not present

## 2023-10-12 DIAGNOSIS — J9621 Acute and chronic respiratory failure with hypoxia: Secondary | ICD-10-CM | POA: Diagnosis not present

## 2023-10-12 DIAGNOSIS — Z85118 Personal history of other malignant neoplasm of bronchus and lung: Secondary | ICD-10-CM | POA: Diagnosis not present

## 2023-10-12 LAB — COMPREHENSIVE METABOLIC PANEL WITH GFR
ALT: 24 U/L (ref 0–44)
AST: 18 U/L (ref 15–41)
Albumin: 2.4 g/dL — ABNORMAL LOW (ref 3.5–5.0)
Alkaline Phosphatase: 60 U/L (ref 38–126)
Anion gap: 12 (ref 5–15)
BUN: 10 mg/dL (ref 8–23)
CO2: 28 mmol/L (ref 22–32)
Calcium: 9.1 mg/dL (ref 8.9–10.3)
Chloride: 97 mmol/L — ABNORMAL LOW (ref 98–111)
Creatinine, Ser: 0.61 mg/dL (ref 0.61–1.24)
GFR, Estimated: >60 mL/min (ref >60)
Glucose, Bld: 157 mg/dL — ABNORMAL HIGH (ref 70–99)
Potassium: 3 mmol/L — ABNORMAL LOW (ref 3.5–5.1)
Sodium: 137 mmol/L (ref 135–145)
Total Bilirubin: 0.3 mg/dL (ref 0.0–1.2)
Total Protein: 6.1 g/dL — ABNORMAL LOW (ref 6.5–8.1)

## 2023-10-12 LAB — CBC WITH DIFFERENTIAL/PLATELET
Abs Immature Granulocytes: 0.25 K/uL — ABNORMAL HIGH (ref 0.00–0.07)
Basophils Absolute: 0.1 K/uL (ref 0.0–0.1)
Basophils Relative: 0 %
Eosinophils Absolute: 0 K/uL (ref 0.0–0.5)
Eosinophils Relative: 0 %
HCT: 34.9 % — ABNORMAL LOW (ref 39.0–52.0)
Hemoglobin: 11.5 g/dL — ABNORMAL LOW (ref 13.0–17.0)
Immature Granulocytes: 1 %
Lymphocytes Relative: 6 %
Lymphs Abs: 1.2 K/uL (ref 0.7–4.0)
MCH: 27.2 pg (ref 26.0–34.0)
MCHC: 33 g/dL (ref 30.0–36.0)
MCV: 82.5 fL (ref 80.0–100.0)
Monocytes Absolute: 1.6 K/uL — ABNORMAL HIGH (ref 0.1–1.0)
Monocytes Relative: 7 %
Neutro Abs: 18.8 K/uL — ABNORMAL HIGH (ref 1.7–7.7)
Neutrophils Relative %: 86 %
Platelets: 314 K/uL (ref 150–400)
RBC: 4.23 MIL/uL (ref 4.22–5.81)
RDW: 14.8 % (ref 11.5–15.5)
WBC: 21.9 K/uL — ABNORMAL HIGH (ref 4.0–10.5)
nRBC: 0 % (ref 0.0–0.2)

## 2023-10-12 LAB — MAGNESIUM: Magnesium: 1.8 mg/dL (ref 1.7–2.4)

## 2023-10-12 LAB — PHOSPHORUS: Phosphorus: 2.3 mg/dL — ABNORMAL LOW (ref 2.5–4.6)

## 2023-10-12 MED ORDER — GUAIFENESIN ER 600 MG PO TB12
1200.0000 mg | ORAL_TABLET | Freq: Two times a day (BID) | ORAL | 0 refills | Status: AC
  Filled 2023-10-12: qty 20, 5d supply, fill #0

## 2023-10-12 MED ORDER — BISACODYL 10 MG RE SUPP
10.0000 mg | Freq: Once | RECTAL | Status: AC
  Administered 2023-10-12: 10 mg via RECTAL
  Filled 2023-10-12: qty 1

## 2023-10-12 MED ORDER — PREDNISONE 10 MG (21) PO TBPK
ORAL_TABLET | ORAL | 0 refills | Status: DC
  Filled 2023-10-12: qty 21, 7d supply, fill #0

## 2023-10-12 MED ORDER — LEVALBUTEROL HCL 0.63 MG/3ML IN NEBU: 0.6300 mg | INHALATION_SOLUTION | Freq: Two times a day (BID) | RESPIRATORY_TRACT | Status: DC

## 2023-10-12 MED ORDER — ACETAMINOPHEN 325 MG PO TABS
650.0000 mg | ORAL_TABLET | Freq: Four times a day (QID) | ORAL | 0 refills | Status: DC | PRN
  Filled 2023-10-12: qty 20, 3d supply, fill #0

## 2023-10-12 MED ORDER — MAGNESIUM SULFATE 2 GM/50ML IV SOLN
2.0000 g | Freq: Once | INTRAVENOUS | Status: AC
  Administered 2023-10-12: 2 g via INTRAVENOUS
  Filled 2023-10-12: qty 50

## 2023-10-12 MED ORDER — POTASSIUM CHLORIDE CRYS ER 20 MEQ PO TBCR
40.0000 meq | EXTENDED_RELEASE_TABLET | ORAL | Status: AC
  Administered 2023-10-12 (×2): 40 meq via ORAL
  Filled 2023-10-12 (×2): qty 2

## 2023-10-12 MED ORDER — HYDROXYZINE HCL 25 MG PO TABS
25.0000 mg | ORAL_TABLET | Freq: Three times a day (TID) | ORAL | 0 refills | Status: AC | PRN
  Filled 2023-10-12: qty 30, 10d supply, fill #0

## 2023-10-12 MED ORDER — IPRATROPIUM BROMIDE 0.02 % IN SOLN: 0.5000 mg | Freq: Two times a day (BID) | RESPIRATORY_TRACT | Status: DC

## 2023-10-12 MED ORDER — K PHOS MONO-SOD PHOS DI & MONO 155-852-130 MG PO TABS
500.0000 mg | ORAL_TABLET | Freq: Once | ORAL | Status: AC
  Administered 2023-10-12: 500 mg via ORAL
  Filled 2023-10-12: qty 2

## 2023-10-12 MED ORDER — DOXYCYCLINE HYCLATE 100 MG PO TABS
100.0000 mg | ORAL_TABLET | Freq: Two times a day (BID) | ORAL | 0 refills | Status: AC
  Filled 2023-10-12: qty 10, 5d supply, fill #0

## 2023-10-12 MED ORDER — APIXABAN 5 MG PO TABS: 5.0000 mg | ORAL_TABLET | Freq: Two times a day (BID) | ORAL | Status: DC

## 2023-10-12 NOTE — Telephone Encounter (Signed)
 Pharmacy Patient Advocate Encounter   Received notification from Inpatient Request that prior authorization for hydrOXYzine  HCl 25MG  tablets is required/requested.   Insurance verification completed.   The patient is insured through [facility] .   Per test claim: PA required; PA submitted to above mentioned insurance via CoverMyMeds Key/confirmation #/EOC [id number] Status is pending

## 2023-10-12 NOTE — Progress Notes (Signed)
 Mobility Specialist Progress Note;   10/12/23 1152  Mobility  Activity Ambulated with assistance in room;Ambulated with assistance in hallway  Level of Assistance Standby assist, set-up cues, supervision of patient - no hands on  Assistive Device None  Distance Ambulated (ft) 20 ft  Activity Response Tolerated well  Mobility Referral Yes  Mobility visit 1 Mobility  Mobility Specialist Start Time (ACUTE ONLY) 1152  Mobility Specialist Stop Time (ACUTE ONLY) 1206  Mobility Specialist Time Calculation (min) (ACUTE ONLY) 14 min   Pt agreeable to limited mobility w/ max encouragement from this MS and 2 Rns. Required no physical assistance. SPO2 remained 95%> throughout ambulation on RA. See walking O2 note. Pt returned back to bed and left with all needs met.   Lauraine Erm Mobility Specialist Please contact via SecureChat or [employer] [phone]

## 2023-10-12 NOTE — Progress Notes (Signed)
 Blake Mcknight to be D/C'd Home per MD order.  Discussed with the patient and all questions fully answered.  VSS, Skin clean, dry and intact without evidence of skin break down, no evidence of skin tears noted. IV catheter discontinued intact. Site without signs and symptoms of complications. Dressing and pressure applied.  An After Visit Summary was printed and given to the patient. Patient received prescription.  D/c education completed with patient/family including follow up instructions, medication list, d/c activities limitations if indicated, with other d/c instructions as indicated by MD - patient able to verbalize understanding, all questions fully answered.   Patient instructed to return to ED, call 911, or call MD for any changes in condition.   Patient escorted via WC, and D/C home via private auto.  Blake Mcknight [name] 10/12/2023 5:36 PM

## 2023-10-12 NOTE — TOC Transition Note (Signed)
 Transition of Care (TOC) - Discharge Note Rayfield Gobble RN, BSN Inpatient Care Management Unit 4E- RN Case Manager See Treatment Team for direct phone #   Patient Details  Name: Blake Mcknight MRN: [id number] Date of Birth: 1956-03-07  [facility]) CM/SW Contact:  Gobble Rayfield Hurst, RN Phone Number: 10/12/2023, 3:23 PM   Clinical Narrative:    Pt stable for transition home today, No f/u recommendations noted for [facility] or DME needs. Pt from home w/ sister that assist. Has DME and followed by outpt Palliative w/ [facility].   RNCM will sign off for now as intervention is no longer needed. Please re-consult  if new needs arise, or contact RNCM assigned to treatment team for further questions/concerns.     Final next level of care: Home/Self Care Barriers to Discharge: Barriers Resolved   Patient Goals and CMS Choice     Choice offered to / list presented to : NA      Discharge Placement               Home        Discharge Plan and Services Additional resources added to the After Visit Summary for   In-house Referral: Clinical Social Work Discharge Planning Services: CM Consult Post Acute Care Choice: NA          DME Arranged: N/A DME Agency: NA       HH Arranged: NA HH Agency: NA        Social Drivers of Health (SDOH) Interventions SDOH Screenings   Food Insecurity: No Food Insecurity (10/08/2023)  Recent Concern: Food Insecurity - Food Insecurity Present (08/15/2023)  Housing: Low Risk  (10/08/2023)  Transportation Needs: No Transportation Needs (10/08/2023)  Utilities: Not At Risk (10/08/2023)  Recent Concern: Utilities - At Risk (08/15/2023)  Social Connections: Socially Isolated (10/08/2023)  Tobacco Use: Medium Risk (10/09/2023)     Readmission Risk Interventions    10/12/2023    3:23 PM 04/17/2023   11:09 AM  Readmission Risk Prevention Plan  Transportation Screening Complete Complete  PCP or Specialist Appt within 5-7 Days   Complete  Home Care Screening  Complete  Medication Review (RN CM)  Complete  Medication Review Oceanographer) Complete   Palliative Care Screening Not Applicable   Skilled Nursing Facility Not Applicable

## 2023-10-12 NOTE — Progress Notes (Signed)
 Nurse requested Mobility Specialist to perform oxygen  saturation test with pt which includes removing pt from oxygen  both at rest and while ambulating.  Below are the results from that testing.     Patient Saturations on Room Air at Rest = spO2 98%  Patient Saturations on Room Air while Ambulating = sp02 95% .   Patient Saturations on 0 Liters of oxygen  while Ambulating = sp02 95%  At end of testing pt left in room on 0  Liters of oxygen .  Reported results to nurse.    Lauraine Erm Mobility Specialist Please contact via SecureChat or [employer] [phone]

## 2023-10-12 NOTE — TOC Progression Note (Addendum)
 Transition of Care [facility]) - Progression Note    Patient Details  Name: Blake Mcknight MRN: [id number] Date of Birth: November 12, 1955  [facility] of [facility]) CM/SW Contact  Luise JAYSON Pan, [place] Phone Number: 10/12/2023, 8:43 AM  Clinical Narrative:   Per PT, no PT follow up at this time.   No needs from CSW at this time, please reach out to CSW/RNCM if new needs arise.   Expected Discharge Plan: Home/Self Care Barriers to Discharge: Continued Medical Work up  Expected Discharge Plan and Services In-house Referral: Clinical Social Work Discharge Planning Services: CM Consult   Living arrangements for the past 2 months: Single Family Home                                       Social Drivers of Health (SDOH) Interventions SDOH Screenings   Food Insecurity: No Food Insecurity (10/08/2023)  Recent Concern: Food Insecurity - Food Insecurity Present (08/15/2023)  Housing: Low Risk  (10/08/2023)  Transportation Needs: No Transportation Needs (10/08/2023)  Utilities: Not At Risk (10/08/2023)  Recent Concern: Utilities - At Risk (08/15/2023)  Social Connections: Socially Isolated (10/08/2023)  Tobacco Use: Medium Risk (10/09/2023)    Readmission Risk Interventions    04/17/2023   11:09 AM  Readmission Risk Prevention Plan  Transportation Screening Complete  PCP or Specialist Appt within 5-7 Days Complete  Home Care Screening Complete  Medication Review (RN CM) Complete

## 2023-10-12 NOTE — Care Management Important Message (Signed)
 Important Message  Patient Details  Name: Blake Mcknight MRN: [id number] Date of Birth: 1956-02-12   Important Message Given:        Glade Cuff 10/12/2023, 3:51 PM

## 2023-10-13 ENCOUNTER — Other Ambulatory Visit (HOSPITAL_COMMUNITY): Payer: Self-pay

## 2023-10-13 NOTE — Care Management Important Message (Signed)
 Important Message  Patient Details  Name: Blake Mcknight MRN: [id number] Date of Birth: 1955-08-11   Important Message Given:  Yes - Medicare IM     Claretta Deed 10/13/2023, 10:43 AM

## 2023-10-13 NOTE — Care Management Important Message (Signed)
 Important Message  Patient Details  Name: Blake Mcknight MRN: [id number] Date of Birth: 17-Oct-1955   Important Message Given:  Yes - Medicare IM  Patient left prior to IM delivery will mai acopy to the patient home address   Claretta Deed 10/13/2023, 4:03 PM

## 2023-10-13 NOTE — Telephone Encounter (Signed)
 Pharmacy Patient Advocate Encounter  Received notification from [facility] that Prior Authorization for hydrOXYzine  HCl 25MG  tablets  has been APPROVED from 10/13/2023 to 10/11/2024   PA #/Case ID/Reference #: [id number]

## 2023-10-13 NOTE — Discharge Summary (Signed)
 Physician Discharge Summary   Patient: Blake Mcknight MRN: [id number] DOB: [birth year]  Admit date:     10/08/2023  Discharge date: 10/12/2023  Discharge Physician: Alejandro Marker, DO   PCP: [name], MD   Recommendations at discharge:   Follow-up with PCP within 1 to 2 weeks repeat CBC, CMP, mag, Phos within 1 week Follow-up with pulmonary in outpatient setting and repeat chest x-ray in 3 to 6 weeks Continue palliative care discussions in the outpatient setting and follow-up with them  Discharge Diagnoses: Principal Problem:   Acute on chronic respiratory failure with hypoxia (HCC) Active Problems:   COPD with acute exacerbation (HCC)   History of pulmonary embolism   History of lung cancer   Duodenal mass   Schizophrenia (HCC)   HLD (hyperlipidemia)   BPH (benign prostatic hyperplasia)  Resolved Problems:   * No resolved hospital problems. *  Hospital Course: Patient is a 68 year old African-American male with past medical history significant for Mannam to essential hypertension, hyperlipidemia, COPD with chronic respiratory failure, history of lung cancer status post SBRT, diabetes mellitus type 2, schizophrenia and other comorbidities who presents with worsening shortness of breath.  He had worsening shortness of breath over the last few days and had tried coughing up phlegm and attempted to manage his symptoms with his normal inhalers.  He denies radiating symptoms.  Given his respiratory distress he is brought to the ED and noted to be satting 54% on 4 L supplemental oxygen .  In the ED he was given Solu-Medrol  1.5 mg, DuoNebs and magnesium  sulfate.  Recently hospitalized from 7/16-7/17 with acute on chronic respiratory failure apparently was concern for acute on chronic right-sided pulmonary embolism with moderate clot burden.  He was initiated on Lovenox  but then transition to apixaban .  Also notably he has been recently admitted for acute on chronic respiratory  failure in the setting of COPD exacerbations in last few months.  Respiratory Status is improving slowly now but still wheezing and has some rhonchi.  He was transitioned to oral steroids and antibiotics and ambulatory home O2 screen done and he did not desaturate.  He is stable for discharge at this time and will need to follow-up with PCP and pulmonary in outpatient setting.  Assessment and Plan:  Acute on Chronic Respiratory Failure with hypoxia in the setting of COPD exacerbation:  Patient presents after being unable to cough up sputum this morning despite receiving breathing treatment.  Chest x-ray noting a similar patchy airspace disease of the right medial lung base. O2 saturations were reported to be as low as 54% on 4 L of nasal cannula oxygen  for which patient was placed on BiPAP with improvement in work of breathing.  Patient had been given Solu-Medrol  IV, 2 DuoNeb breathing treatments, and magnesium  sulfate in the ED -Admitted to a progressive bed -Continuous pulse oximetry with oxygen  to maintain O2 saturations -BiPAP as needed -Procalcitonin was <0.10 -Incentive spirometry and flutter valve once able -ChangeDuoNebs scheduled (BID) to Xopenex /Atrovent  q6h; C/w Arformoterol  15 mcg nebs twice daily and Budesonide  0.25 mg nebs twice daily -Changed Prednisone  40 mg po Daily to IV Methylprednisolone  60 mg q12h and will start weaning and go to 60 mg q24h for now we will change the Sterapred taper -Continue Empiric antibiotics of IV Ceftriaxone  for now and will also add Doxycycline ; will continue empiric antibiotics for 5 days total with doxycycline  and oral cefdinir  -Increased guaifenesin  to 1200 mL p.o. twice daily, continue flutter valve, incentive spirometry -Will need an Ambulatory  home O2 screen prior to discharge and will need to monitor respiratory status carefully. Patient requires 2 Liters of Supplemental O2 when Ambulating per PT but this is his baseline -Repeat CXR this AM showed  Right infrahilar opacity similar or slightly improved.    History of PE: Patient had just recently been found to have acute on chronic pulmonary embolism when last checked on 7/16.  Echocardiogram noted no signs for right heart strain and patient was started on Apixaban ; continue with Apixaban  5 mg p.o. twice daily; follow-up with Dr. Shelah in outpatient setting   Lung Cancer: Patient with a right lower lobe mass and presumed to be NSCLC. Not a candidate for EBUS; s/p radiation rx about 1 year ago-primarily followed by Dr. Dante of pulmonology.  It appears that the patient's sister has had talks with palliative.  Will need to continue follow-up with palliative in the outpatient setting and follow-up with pulmonary in outpatient setting  Leukocytosis: In the setting of Steroid Demargination. WBC went from 9.4 -> 14.8 -> 15.0 -> 21.9. CTM for S/Sx of Infection. 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  No further workup was recommended as patient was asymptomatic, but thought likely cancerous in nature.   Schizophrenia: Continue Benztropine  2 mg p.o. twice daily, Risperidone  3 mg p.o. twice daily, and Mirtazapine  30 mg p.o. nightly   Hyperlipidemia: C/w Atorvastatin  10 mg po qHS   BPH: C/w Tamsulosin  0.4 mg po qSupper   Normocytic Anemia: Hgb/Hct went from 12.6/38.2 -> 11.7/35.9 -> 11.2/34.3 -> 11.6/35.3 and is now 11.5/34.9. Checked Anemia Panel and showed an Fe level of 52, UIBC of 185, TIBC 237, Saturation Ratios of 22, Ferritin Level of 205, and Folate Level of 10.9. CTM for S/Sx of Bleeding; No overt bleeding noted. Repeat CBC in the AM  Hypoalbuminemia: Patient's Albumin Lvl went from 2.7 -> 2.6 -> 2.5 -> 2.6 and is now 2.4. CTM and Trend and repeat CMP in the AM  Consultants: None Procedures performed: As delineated as above Disposition: Home health Diet recommendation:  Discharge Diet Orders (From  admission, onward)     Start     Ordered   10/12/23 0000  Diet - low sodium heart healthy        10/12/23 1459           Cardiac diet DISCHARGE MEDICATION: Allergies as of 10/12/2023   No Known Allergies      Medication List     STOP taking these medications    Breztri Aerosphere 160-9-4.8 MCG/ACT Aero inhaler Generic drug: budesonide -glycopyrrolate -formoterol    Eliquis  DVT/PE Starter Pack Generic drug: Apixaban  Starter Pack (10mg  and 5mg ) Replaced by: apixaban  5 MG Tabs tablet       TAKE these medications    acetaminophen  500 MG tablet Commonly known as: TYLENOL  Take 2 tablets (1,000 mg total) by mouth every 8 (eight) hours. What changed:  when to take this reasons to take this   acetaminophen  325 MG tablet Commonly known as: TYLENOL  Take 2 tablets (650 mg total) by mouth every 6 (six) hours as needed for mild pain (pain score 1-3) or fever (or Fever >/= 101). What changed: You were already taking a medication with the same name, and this prescription was added. Make sure you understand how and when to take each.   albuterol  108 (90 Base) MCG/ACT inhaler Commonly known as: VENTOLIN  HFA Inhale 2 puffs into the  lungs every 4 (four) hours as needed for wheezing or shortness of breath.   albuterol  (2.5 MG/3ML) 0.083% nebulizer solution Commonly known as: PROVENTIL  Take 3 mLs (2.5 mg total) by nebulization every 4 (four) hours as needed for wheezing or shortness of breath.   apixaban  5 MG Tabs tablet Commonly known as: ELIQUIS  Take 1 tablet (5 mg total) by mouth 2 (two) times daily. Replaces: Eliquis  DVT/PE Starter Pack   arformoterol  15 MCG/2ML Nebu Commonly known as: BROVANA  Take 2 mLs (15 mcg total) by nebulization 2 (two) times daily.   atorvastatin  10 MG tablet Commonly known as: LIPITOR Take 1 tablet (10 mg total) by mouth at bedtime.   benztropine  2 MG tablet Commonly known as: COGENTIN  Take 1 tablet (2 mg total) by mouth 2 (two) times daily.    budesonide  0.25 MG/2ML nebulizer solution Commonly known as: PULMICORT  Take 2 mLs (0.25 mg total) by nebulization 2 (two) times daily.   carvedilol  3.125 MG tablet Commonly known as: COREG  Take 2 tablets (6.25 mg total) by mouth 2 (two) times daily with a meal.   cetirizine 10 MG tablet Commonly known as: ZYRTEC Take 10 mg by mouth daily as needed for allergies or rhinitis.   cholecalciferol 25 MCG (1000 UNIT) tablet Commonly known as: VITAMIN D3 Take 5,000 Units by mouth every morning.   doxycycline  100 MG tablet Commonly known as: VIBRA -TABS Take 1 tablet (100 mg total) by mouth every 12 (twelve) hours for 5 days.   famotidine  40 MG tablet Commonly known as: PEPCID  Take 40 mg by mouth 2 (two) times daily.   feeding supplement (GLUCERNA SHAKE) Liqd Take 237 mLs by mouth 3 (three) times daily between meals.   guaiFENesin  600 MG 12 hr tablet Commonly known as: MUCINEX  Take 2 tablets (1,200 mg total) by mouth 2 (two) times daily for 5 days.   hydrOXYzine  25 MG tablet Commonly known as: ATARAX  Take 1 tablet (25 mg total) by mouth 3 (three) times daily as needed for anxiety.   ibuprofen  400 MG tablet Commonly known as: ADVIL  Take 1 tablet (400 mg total) by mouth every 6 (six) hours as needed (moderate to severe pain).   ipratropium-albuterol  0.5-2.5 (3) MG/3ML Soln Commonly known as: DUONEB Take 3 mLs by nebulization every 4 (four) hours. What changed:  when to take this reasons to take this   Januvia 100 MG tablet Generic drug: sitaGLIPtin Take 100 mg by mouth every evening.   lidocaine  5 % Commonly known as: LIDODERM  Place 1 patch onto the skin daily. Remove & Discard patch within 12 hours or as directed by MD   methocarbamol  750 MG tablet Commonly known as: ROBAXIN  Take 1 tablet (750 mg total) by mouth every 8 (eight) hours as needed for muscle spasms.   mirtazapine  30 MG tablet Commonly known as: REMERON  Take 1 tablet (30 mg total) by mouth at bedtime as  needed (insomnia). What changed: when to take this   multivitamin Tabs tablet Take 1 tablet by mouth daily.   ondansetron  4 MG tablet Commonly known as: ZOFRAN  Take 1 tablet (4 mg total) by mouth every 6 (six) hours as needed for nausea.   OXYGEN  Inhale 2 L/min into the lungs continuous.   polyethylene glycol 17 g packet Commonly known as: MIRALAX  / GLYCOLAX  Take 17 g by mouth daily.   predniSONE  10 MG (21) Tbpk tablet Commonly known as: STERAPRED UNI-PAK 21 TAB Take 6 tablets on day 1, 5 tablets on day 2, 4 tablets on day 3,  3 tablets on day 4, 2 tablets on day 5, 1 tablet on day 6 and then stop   risperiDONE  3 MG tablet Commonly known as: RISPERDAL  Take 1 tablet (3 mg total) by mouth 2 (two) times daily.   sodium chloride  0.65 % Soln nasal spray Commonly known as: OCEAN Place 1 spray into both nostrils as needed for congestion.   tamsulosin  0.4 MG Caps capsule Commonly known as: FLOMAX  Take 0.4 mg by mouth in the morning and at bedtime.   Trelegy Ellipta  100-62.5-25 MCG/ACT Aepb Generic drug: Fluticasone -Umeclidin-Vilant Inhale 1 puff into the lungs daily as needed (for shortness of breath).        Discharge Exam: Filed Weights   10/08/23 0634  Weight: 57 kg   Vitals:   10/12/23 1115 10/12/23 1645  BP: 127/74 123/77  Pulse:  (!) 107  Resp: 20 18  Temp: 97.6 F (36.4 C) 97.6 F (36.4 C)  SpO2:  95%   Examination: Physical Exam:  Constitutional: Chronically ill-appearing African-American male no acute distress appears calm and in no Respiratory: Diminished to auscultation bilaterally with some coarse breath sounds and does have some slight rhonchi but no appreciable rales or crackles., Normal respiratory effort and patient is not tachypenic. No accessory muscle use.  Not wearing any supplemental oxygen  nasal cannula Cardiovascular: RRR, no murmurs / rubs / gallops. S1 and S2 auscultated. No extremity edema.  Abdomen: Soft, non-tender, non-distended.   Bowel sounds positive.  GU: Deferred. Musculoskeletal: No clubbing / cyanosis of digits/nails. No joint deformity upper and lower extremities. Skin: No rashes, lesions, ulcers on limited skin evaluation. No induration; Warm and dry.  Neurologic: CN 2-12 grossly intact with no focal deficits. Romberg sign and cerebellar reflexes not assessed.  Psychiatric: Appears calm  Condition at discharge: stable  The results of significant diagnostics from this hospitalization (including imaging, microbiology, ancillary and laboratory) are listed below for reference.   Imaging Studies: DG CHEST PORT 1 VIEW Result Date: 10/11/2023 CLINICAL DATA:  Shortness of breath. EXAM: PORTABLE CHEST 1 VIEW COMPARISON:  Chest CT dated 10/10/2023. FINDINGS: Background of emphysema. Right infrahilar opacity similar or slightly improved. No pleural effusion or pneumothorax. Stable cardiac silhouette. No acute osseous pathology. IMPRESSION: Right infrahilar opacity similar or slightly improved. Electronically Signed   By: Vanetta Chou M.D.   On: 10/11/2023 10:49   DG CHEST PORT 1 VIEW Result Date: 10/10/2023 CLINICAL DATA:  141880 SOB (shortness of breath) 141880 EXAM: PORTABLE CHEST - 1 VIEW COMPARISON:  10/08/2023 FINDINGS: Stable changes of bullous emphysema. No new infiltrate or overt edema. Heart size and mediastinal contours are within normal limits. No effusion. Visualized bones unremarkable. IMPRESSION: Bullous emphysema. No acute findings. Electronically Signed   By: JONETTA Faes M.D.   On: 10/10/2023 07:57   DG Chest Portable 1 View Result Date: 10/08/2023 CLINICAL DATA:  Shortness of breath. EXAM: PORTABLE CHEST 1 VIEW COMPARISON:  09/30/2023 FINDINGS: Marked hyperexpansion. Emphysema with diffuse bullous changes, similar to prior. No dense focal consolidation although patchy airspace disease in the medial right base is similar to prior. No substantial pleural effusion. The cardiopericardial silhouette is within  normal limits for size. No acute bony abnormality. Telemetry leads overlie the chest. IMPRESSION: Marked hyperexpansion with emphysema and diffuse bullous changes. Patchy airspace disease in the medial right base is similar to prior. Electronically Signed   By: Camellia Candle M.D.   On: 10/08/2023 07:12   ECHOCARDIOGRAM COMPLETE Result Date: 10/01/2023    ECHOCARDIOGRAM REPORT   Patient Name:  DEBBY JAYSON SLADE Date of Exam: 10/01/2023 Medical Rec #:  [id number]           Height:       66.0 in Accession #:    [id number]          Weight:       127.0 lb Date of Birth:  1956/02/12            BSA:          1.649 m Patient Age:    67 years            BP:           154/84 mmHg Patient Gender: M                   HR:           84 bpm. Exam Location:  Inpatient Procedure: 2D Echo, Color Doppler and Cardiac Doppler (Both Spectral and Color            Flow Doppler were utilized during procedure). Indications:    Pulmonary embolus  History:        Patient has no prior history of Echocardiogram examinations.                 Risk Factors:Hypertension.  Sonographer:    Benard Stallion Referring Phys: [id number] RAVI PAHWANI IMPRESSIONS  1. Left ventricular ejection fraction, by estimation, is 70 to 75%. The left ventricle has hyperdynamic function. The left ventricle has no regional wall motion abnormalities. Left ventricular diastolic parameters were normal.  2. Right ventricular systolic function is normal. The right ventricular size is normal.  3. The mitral valve is normal in structure. No evidence of mitral valve regurgitation. No evidence of mitral stenosis.  4. The aortic valve is normal in structure. Aortic valve regurgitation is not visualized. No aortic stenosis is present.  5. The inferior vena cava is normal in size with greater than 50% respiratory variability, suggesting right atrial pressure of 3 mmHg. FINDINGS  Left Ventricle: Left ventricular ejection fraction, by estimation, is 70 to 75%. The left ventricle has  hyperdynamic function. The left ventricle has no regional wall motion abnormalities. The left ventricular internal cavity size was normal in size. There is no left ventricular hypertrophy. Left ventricular diastolic parameters were normal. Right Ventricle: The right ventricular size is normal. No increase in right ventricular wall thickness. Right ventricular systolic function is normal. Left Atrium: Left atrial size was normal in size. Right Atrium: Right atrial size was normal in size. Pericardium: Trivial pericardial effusion is present. Mitral Valve: The mitral valve is normal in structure. No evidence of mitral valve regurgitation. No evidence of mitral valve stenosis. Tricuspid Valve: The tricuspid valve is normal in structure. Tricuspid valve regurgitation is not demonstrated. No evidence of tricuspid stenosis. Aortic Valve: The aortic valve is normal in structure. Aortic valve regurgitation is not visualized. No aortic stenosis is present. Aortic valve mean gradient measures 2.5 mmHg. Aortic valve peak gradient measures 4.5 mmHg. Aortic valve area, by VTI measures 3.47 cm. Pulmonic Valve: The pulmonic valve was normal in structure. Pulmonic valve regurgitation is not visualized. No evidence of pulmonic stenosis. Aorta: The aortic root is normal in size and structure. Venous: The inferior vena cava is normal in size with greater than 50% respiratory variability, suggesting right atrial pressure of 3 mmHg. IAS/Shunts: The interatrial septum was not well visualized.  LEFT VENTRICLE PLAX 2D LVIDd:         4.35  cm   Diastology LVIDs:         2.55 cm   LV e' medial:    7.62 cm/s LV PW:         0.80 cm   LV E/e' medial:  12.7 LV IVS:        0.80 cm   LV e' lateral:   8.24 cm/s LVOT diam:     2.20 cm   LV E/e' lateral: 11.7 LV SV:         66 LV SV Index:   40 LVOT Area:     3.80 cm  RIGHT VENTRICLE RV Basal diam:  3.20 cm RV Mid diam:    3.00 cm RV S prime:     20.00 cm/s TAPSE (M-mode): 2.0 cm LEFT ATRIUM              Index        RIGHT ATRIUM           Index LA diam:        3.20 cm 1.94 cm/m   RA Area:     10.20 cm LA Vol (A2C):   29.9 ml 18.13 ml/m  RA Volume:   20.20 ml  12.25 ml/m LA Vol (A4C):   17.2 ml 10.43 ml/m LA Biplane Vol: 23.3 ml 14.13 ml/m  AORTIC VALVE AV Area (Vmax):    3.75 cm AV Area (Vmean):   3.50 cm AV Area (VTI):     3.47 cm AV Vmax:           105.50 cm/s AV Vmean:          71.200 cm/s AV VTI:            0.190 m AV Peak Grad:      4.5 mmHg AV Mean Grad:      2.5 mmHg LVOT Vmax:         104.00 cm/s LVOT Vmean:        65.600 cm/s LVOT VTI:          0.173 m LVOT/AV VTI ratio: 0.91  AORTA Ao Root diam: 2.90 cm MITRAL VALVE MV Area (PHT): 4.89 cm     SHUNTS MV Decel Time: 155 msec     Systemic VTI:  0.17 m MV E velocity: 96.70 cm/s   Systemic Diam: 2.20 cm MV A velocity: 101.00 cm/s MV E/A ratio:  0.96 Morene Brownie Electronically signed by Morene Brownie Signature Date/Time: 10/01/2023/3:07:23 PM    Final    VAS US  LOWER EXTREMITY VENOUS (DVT) Result Date: 09/30/2023  Lower Venous DVT Study Patient Name:  JETT FUKUDA  Date of Exam:   09/30/2023 Medical Rec #: [id number]            Accession #:    [id number] Date of Birth: 1955/11/19             Patient Gender: M Patient Age:   80 years Exam Location:  [facility] Procedure:      VAS US  LOWER EXTREMITY VENOUS (DVT) Referring Phys: ISAIAH LEVER --------------------------------------------------------------------------------  Indications: Pulmonary embolism, and SOB.  Comparison Study: No priors. Performing Technologist: Ricka Sturdivant-Jones RDMS, RVT  Examination Guidelines: A complete evaluation includes B-mode imaging, spectral Doppler, color Doppler, and power Doppler as needed of all accessible portions of each vessel. Bilateral testing is considered an integral part of a complete examination. Limited examinations for reoccurring indications may be performed as noted. The reflux portion of the exam is performed with the patient  in  reverse Trendelenburg.  +---------+---------------+---------+-----------+----------+--------------+ RIGHT    CompressibilityPhasicitySpontaneityPropertiesThrombus Aging +---------+---------------+---------+-----------+----------+--------------+ CFV      Full           Yes      Yes                                 +---------+---------------+---------+-----------+----------+--------------+ SFJ      Full                                                        +---------+---------------+---------+-----------+----------+--------------+ FV Prox  Full                                                        +---------+---------------+---------+-----------+----------+--------------+ FV Mid   Full                                                        +---------+---------------+---------+-----------+----------+--------------+ FV DistalFull                                                        +---------+---------------+---------+-----------+----------+--------------+ PFV      Full                                                        +---------+---------------+---------+-----------+----------+--------------+ POP      Full           Yes      Yes                                 +---------+---------------+---------+-----------+----------+--------------+ PTV      Full                                                        +---------+---------------+---------+-----------+----------+--------------+ PERO     Full                                                        +---------+---------------+---------+-----------+----------+--------------+   +---------+---------------+---------+-----------+----------+--------------+ LEFT     CompressibilityPhasicitySpontaneityPropertiesThrombus Aging +---------+---------------+---------+-----------+----------+--------------+ CFV      Full           Yes      Yes                                  +---------+---------------+---------+-----------+----------+--------------+  SFJ      Full                                                        +---------+---------------+---------+-----------+----------+--------------+ FV Prox  Full                                                        +---------+---------------+---------+-----------+----------+--------------+ FV Mid   Full                                                        +---------+---------------+---------+-----------+----------+--------------+ FV DistalFull                                                        +---------+---------------+---------+-----------+----------+--------------+ PFV      Full                                                        +---------+---------------+---------+-----------+----------+--------------+ POP      Full           Yes      Yes                                 +---------+---------------+---------+-----------+----------+--------------+ PTV      Full                                                        +---------+---------------+---------+-----------+----------+--------------+ PERO     Full                                                        +---------+---------------+---------+-----------+----------+--------------+     Summary: BILATERAL: - No evidence of deep vein thrombosis seen in the lower extremities, bilaterally. -No evidence of popliteal cyst, bilaterally.   *See table(s) above for measurements and observations. Electronically signed by Gaile New MD on 09/30/2023 at 5:10:29 PM.    Final    CT Angio Chest PE W and/or Wo Contrast Addendum Date: 09/30/2023 ADDENDUM REPORT: 09/30/2023 12:35 ADDENDUM: The original report was by Dr. Ryan Salvage. The following addendum is by Dr. Ryan Salvage: Critical Value/emergent results were called by telephone at the time of interpretation on 09/30/2023 at 12:34 pm to provider Dr. Scott Zackowski, who  verbally acknowledged these results. Electronically  Signed   By: Ryan Salvage M.D.   On: 09/30/2023 12:35   Result Date: 09/30/2023 CLINICAL DATA:  Shortness of breath starting at 3 a.m. COPD. Right lower lobe lung cancer. * Tracking Code: BO * EXAM: CT ANGIOGRAPHY CHEST WITH CONTRAST TECHNIQUE: Multidetector CT imaging of the chest was performed using the standard protocol during bolus administration of intravenous contrast. Multiplanar CT image reconstructions and MIPs were obtained to evaluate the vascular anatomy. RADIATION DOSE REDUCTION: This exam was performed according to the departmental dose-optimization program which includes automated exposure control, adjustment of the mA and/or kV according to patient size and/or use of iterative reconstruction technique. CONTRAST:  75mL OMNIPAQUE  IOHEXOL  350 MG/ML SOLN COMPARISON:  09/04/2023 FINDINGS: Cardiovascular: Acute on chronic right lower lobe pulmonary embolus with peripheral thrombus causing narrowing of the right lower lobe pulmonary artery on image 75 series 5, and more central filling defect in medial branches of the right lower lobe pulmonary artery for example on image 82 series 5. Overall clot burden is moderate although the acute clot burden is mild. This pulmonary embolus was not present on 09/04/2023. Right ventricular to left ventricular ratio 1.18 which is elevated. Coronary, aortic arch, and branch vessel atherosclerotic vascular disease. Small anterior pericardial effusion. Mediastinum/Nodes: Right hilar node 1.3 cm in short axis on image 61 series 5, stable. Subcarinal adenopathy 2.7 cm in short axis on image 69 series 5, stable. Lungs/Pleura: Severe primarily paraseptal emphysema. Masslike consolidative findings in the right lower lobe similar to 09/04/2023 and raising concern for underlying lung mass or malignancy particularly given the persistent adenopathy. Trace right pleural effusion. Peripheral pulmonary calcifications in the  posterior basal segments of both lower lobes. Airway thickening and plugging in both lower lobes nodularity in the left lower lobe may be the result of varicoid bronchiectasis for example posteriorly on image 102 series 6, and is not substantially changed from prior, with true pulmonary nodules being a possible but less likely differential diagnostic consideration. Stable regions of nodularity with bandlike components anteriorly in the left upper lobe for example on images 72-79 of series 6. Upper Abdomen: Questionable 8 mm pancreatic cystic lesion on image 90 of series 8, roughly similar on prior exams for the last 3 years. This could be a tiny indolent intraductal papillary mucinous neoplasm or a small postinflammatory cystic lesion. Consider surveillance in the context of the patient's planned chest CT surveillance imaging. Musculoskeletal: Stable vertebra plana at T7. Review of the MIP images confirms the above findings. IMPRESSION: 1. Acute on chronic (but all new compared to 09/04/2023) morphology right lower lobe pulmonary embolus with peripheral thrombus causing narrowing of the right lower lobe pulmonary artery and more central filling defect in medial branches of the right lower lobe pulmonary artery. Overall clot burden is moderate although the acute clot burden is mild. This pulmonary embolus was not present on 09/04/2023. 2. Positive for acute PE with CT evidence of right heart strain (RV/LV Ratio = 1.18) consistent with at least submassive (intermediate risk) PE. The presence of right heart strain has been associated with an increased risk of morbidity and mortality. Please refer to the Code PE Focused order set in EPIC. 3. Masslike consolidative findings in the right lower lobe similar to 09/04/2023 and suspicious for underlying lung mass or malignancy particularly given the persistent adenopathy. 4. Trace right pleural effusion. 5. Airway thickening and plugging in both lower lobes. 6. Stable regions  of nodularity with bandlike components anteriorly in the left upper lobe. 7. Questionable 8  mm pancreatic cystic lesion, roughly similar on prior exams for the last 3 years. This could be a tiny indolent intraductal papillary mucinous neoplasm or a small postinflammatory cystic lesion. Consider surveillance in the context of the patient's planned chest CT surveillance imaging. 8. Stable vertebra plana at T7. 9. Aortic Atherosclerosis (ICD10-I70.0) and Emphysema (ICD10-J43.9). Electronically Signed: By: Ryan Salvage M.D. On: 09/30/2023 12:21   DG Chest Portable 1 View Result Date: 09/30/2023 CLINICAL DATA:  SOB that started around 0300 when he woke up. Hx of COPD, EXAM: PORTABLE CHEST 1 VIEW COMPARISON:  6/225/25. FINDINGS: Stable cardiomediastinal contours. Severe changes of emphysema. No pleural effusion or interstitial edema. Persistent opacification within the medial aspect of the right lower lung corresponding to area of consolidative change as noted on 09/06/2023. Compared with the previous exam this is slightly improved in the interval. IMPRESSION: 1. Persistent opacification within the medial aspect of the right lower lung corresponding to area of consolidative change as noted on 09/06/2023. 2. Severe emphysema. Electronically Signed   By: Waddell Calk M.D.   On: 09/30/2023 05:15   Microbiology: Results for orders placed or performed during the hospital encounter of 10/08/23  MRSA Next Gen by PCR, Nasal     Status: None   Collection Time: 10/08/23  4:04 PM   Specimen: Nasal Mucosa; Nasal Swab  Result Value Ref Range Status   MRSA by PCR Next Gen NOT DETECTED NOT DETECTED Final    Comment: (NOTE) The GeneXpert MRSA Assay (FDA approved for NASAL specimens only), is one component of a comprehensive MRSA colonization surveillance program. It is not intended to diagnose MRSA infection nor to guide or monitor treatment for MRSA infections. Test performance is not FDA approved in patients  less than 9 years old. Performed at [facility], [address], [place], [place] [address]    Labs: CBC: Recent Labs  Lab 10/08/23 [phone] 10/09/23 0351 10/10/23 0247 10/11/23 0353 10/12/23 0350  WBC 10.6* 9.4 14.8* 15.0* 21.9*  NEUTROABS 5.7  --  11.1* 12.7* 18.8*  HGB 12.6* 11.7* 11.2* 11.6* 11.5*  HCT 38.2* 35.9* 34.3* 35.3* 34.9*  MCV 83.4 82.3 82.9 82.1 82.5  PLT 329 318 329 303 314   Basic Metabolic Panel: Recent Labs  Lab 10/08/23 0632 10/09/23 0351 10/10/23 0247 10/11/23 0353 10/12/23 0350  NA 136 138 139 139 137  K 3.7 4.1 3.7 3.7 3.0*  CL 98 98 96* 102 97*  CO2 26 29 29 27 28   GLUCOSE 171* 137* 87 173* 157*  BUN 5* 7* 10 7* 10  CREATININE 0.68 0.74 0.63 0.63 0.61  CALCIUM  9.1 9.2 9.1 8.9 9.1  MG  --   --  2.0 1.9 1.8  PHOS  --   --  2.5 2.5 2.3*   Liver Function Tests: Recent Labs  Lab 10/08/23 0632 10/10/23 0247 10/11/23 0353 10/12/23 0350  AST 15 16 18 18   ALT 15 17 20 24   ALKPHOS 71 57 63 60  BILITOT 0.5 0.2 0.3 0.3  PROT 6.6 6.3* 6.6 6.1*  ALBUMIN 2.6* 2.5* 2.6* 2.4*   CBG: No results for input(s): GLUCAP in the last 168 hours.  Discharge time spent: greater than 30 minutes.  Signed: Alejandro Marker, DO [facility] 10/13/2023

## 2023-10-15 DIAGNOSIS — I119 Hypertensive heart disease without heart failure: Secondary | ICD-10-CM | POA: Diagnosis not present

## 2023-10-15 DIAGNOSIS — E1165 Type 2 diabetes mellitus with hyperglycemia: Secondary | ICD-10-CM | POA: Diagnosis not present

## 2023-10-15 DIAGNOSIS — S22068D Other fracture of T7-T8 thoracic vertebra, subsequent encounter for fracture with routine healing: Secondary | ICD-10-CM | POA: Diagnosis not present

## 2023-10-15 DIAGNOSIS — J9621 Acute and chronic respiratory failure with hypoxia: Secondary | ICD-10-CM | POA: Diagnosis not present

## 2023-10-15 DIAGNOSIS — C3431 Malignant neoplasm of lower lobe, right bronchus or lung: Secondary | ICD-10-CM | POA: Diagnosis not present

## 2023-10-15 DIAGNOSIS — J441 Chronic obstructive pulmonary disease with (acute) exacerbation: Secondary | ICD-10-CM | POA: Diagnosis not present

## 2023-10-19 DIAGNOSIS — C3431 Malignant neoplasm of lower lobe, right bronchus or lung: Secondary | ICD-10-CM | POA: Diagnosis not present

## 2023-10-19 DIAGNOSIS — E7800 Pure hypercholesterolemia, unspecified: Secondary | ICD-10-CM | POA: Diagnosis not present

## 2023-10-19 DIAGNOSIS — J441 Chronic obstructive pulmonary disease with (acute) exacerbation: Secondary | ICD-10-CM | POA: Diagnosis not present

## 2023-10-19 DIAGNOSIS — E1165 Type 2 diabetes mellitus with hyperglycemia: Secondary | ICD-10-CM | POA: Diagnosis not present

## 2023-10-19 DIAGNOSIS — M1990 Unspecified osteoarthritis, unspecified site: Secondary | ICD-10-CM | POA: Diagnosis not present

## 2023-10-19 DIAGNOSIS — J9621 Acute and chronic respiratory failure with hypoxia: Secondary | ICD-10-CM | POA: Diagnosis not present

## 2023-10-19 DIAGNOSIS — K219 Gastro-esophageal reflux disease without esophagitis: Secondary | ICD-10-CM | POA: Diagnosis not present

## 2023-10-19 DIAGNOSIS — F209 Schizophrenia, unspecified: Secondary | ICD-10-CM | POA: Diagnosis not present

## 2023-10-19 DIAGNOSIS — Z7984 Long term (current) use of oral hypoglycemic drugs: Secondary | ICD-10-CM | POA: Diagnosis not present

## 2023-10-19 DIAGNOSIS — I119 Hypertensive heart disease without heart failure: Secondary | ICD-10-CM | POA: Diagnosis not present

## 2023-10-19 DIAGNOSIS — F319 Bipolar disorder, unspecified: Secondary | ICD-10-CM | POA: Diagnosis not present

## 2023-10-19 DIAGNOSIS — N400 Enlarged prostate without lower urinary tract symptoms: Secondary | ICD-10-CM | POA: Diagnosis not present

## 2023-10-19 DIAGNOSIS — S22068D Other fracture of T7-T8 thoracic vertebra, subsequent encounter for fracture with routine healing: Secondary | ICD-10-CM | POA: Diagnosis not present

## 2023-10-19 DIAGNOSIS — Z9981 Dependence on supplemental oxygen: Secondary | ICD-10-CM | POA: Diagnosis not present

## 2023-10-19 DIAGNOSIS — E559 Vitamin D deficiency, unspecified: Secondary | ICD-10-CM | POA: Diagnosis not present

## 2023-10-20 DIAGNOSIS — J9621 Acute and chronic respiratory failure with hypoxia: Secondary | ICD-10-CM | POA: Diagnosis not present

## 2023-10-20 DIAGNOSIS — E1165 Type 2 diabetes mellitus with hyperglycemia: Secondary | ICD-10-CM | POA: Diagnosis not present

## 2023-10-20 DIAGNOSIS — C3431 Malignant neoplasm of lower lobe, right bronchus or lung: Secondary | ICD-10-CM | POA: Diagnosis not present

## 2023-10-20 DIAGNOSIS — J441 Chronic obstructive pulmonary disease with (acute) exacerbation: Secondary | ICD-10-CM | POA: Diagnosis not present

## 2023-10-20 DIAGNOSIS — I119 Hypertensive heart disease without heart failure: Secondary | ICD-10-CM | POA: Diagnosis not present

## 2023-10-20 DIAGNOSIS — S22068D Other fracture of T7-T8 thoracic vertebra, subsequent encounter for fracture with routine healing: Secondary | ICD-10-CM | POA: Diagnosis not present

## 2023-10-21 ENCOUNTER — Encounter: Payer: Self-pay | Admitting: Emergency Medicine

## 2023-10-21 ENCOUNTER — Ambulatory Visit (INDEPENDENT_AMBULATORY_CARE_PROVIDER_SITE_OTHER): Admitting: Emergency Medicine

## 2023-10-21 VITALS — BP 110/76 | HR 95 | Temp 97.9°F | Ht 66.0 in | Wt 141.0 lb

## 2023-10-21 DIAGNOSIS — J441 Chronic obstructive pulmonary disease with (acute) exacerbation: Secondary | ICD-10-CM

## 2023-10-21 DIAGNOSIS — Z85118 Personal history of other malignant neoplasm of bronchus and lung: Secondary | ICD-10-CM

## 2023-10-21 DIAGNOSIS — J9611 Chronic respiratory failure with hypoxia: Secondary | ICD-10-CM

## 2023-10-21 DIAGNOSIS — I2699 Other pulmonary embolism without acute cor pulmonale: Secondary | ICD-10-CM

## 2023-10-21 DIAGNOSIS — C3431 Malignant neoplasm of lower lobe, right bronchus or lung: Secondary | ICD-10-CM

## 2023-10-21 DIAGNOSIS — R918 Other nonspecific abnormal finding of lung field: Secondary | ICD-10-CM

## 2023-10-21 MED ORDER — TRAMADOL HCL 50 MG PO TABS: 50.0000 mg | ORAL_TABLET | Freq: Four times a day (QID) | ORAL | 0 refills | Status: DC | PRN

## 2023-10-21 MED ORDER — TRAMADOL HCL 50 MG PO TABS: 50.0000 mg | ORAL_TABLET | Freq: Four times a day (QID) | ORAL | 0 refills | Status: AC | PRN

## 2023-10-21 NOTE — Assessment & Plan Note (Signed)
 New diagnosis in July.  Given his history of malignancy he needs to be on lifelong anticoagulation if he is able to tolerate.  Continue Eliquis .

## 2023-10-21 NOTE — Progress Notes (Signed)
 Subjective:    Patient ID: Blake Mcknight, male    DOB: 01-Jul-1955, 68 y.o.   MRN: [id number]  COPD His past medical history is significant for COPD.    ROV 11/05/2022.  Blake Mcknight is 69 with emphysematous COPD and associated chronic hypoxemic respiratory failure, paranoid schizophrenia, hypertension.  I have seen him for his COPD and also an abnormal CT scan of the chest with subcarinal lymphadenopathy, waxing and waning pulmonary nodular disease.  He was in the emergency department 10/09/2022 and had evidence for increasing right lower lobe mass, now 5.7 cm, previously measured at 2.3 cm with some additional scattered nodular opacities (stable).  We had been managing him on Breztri, DuoNeb as needed - about every 4 hours.  Also on prednisone  20mg  daily.  His oxygen  is at 2L/min.  He is having cough. Light gray mucous, no hemoptysis. He is able to get up and move through the house, but is otherwise quite limited.   CT chest 10/09/2022 reviewed by me, shows patent central airways, mild bilateral bronchiectasis, increasing right lower lobe mass, now 5.7 x 3.7 cm (from 2.3 x 1.6 cm).  There are scattered stable right middle lobe and other nodules  ROV 10/21/2023 --68 year old gentleman with COPD and emphysema with associated chronic hypoxemic respiratory failure.  He also has paranoid schizophrenia, hypertension, abnormal CT scan of the chest with subcarinal adenopathy and a right lower lobe mass that has waxed and waned in size and prominence.  He has been in the hospital at least 3 times since I last saw him in August 2024.  Most recently in July he was diagnosed with acute pulmonary embolism.  His CT scans of the chest in May, June and July showed progression of right basilar airspace disease, mediastinal adenopathy.  Question pneumonia, question possible malignancy. He has had XRT to the RLL empirically.  Today he reports that his cough better. Remains quite debilitated. He is tolerating the  Eliquis . Remains on 2L/min. He ios on brovana  + Pulmicort , albuterol  prn.   Review of Systems As per HPI     Objective:   Physical Exam Vitals:   10/21/23 1358  BP: 110/76  Pulse: 95  Temp: 97.9 F (36.6 C)  SpO2: 92%  Weight: 141 lb (64 kg)  Height: 5' 6 (1.676 m)     Gen: Debilitated man in a wheelchair, well-nourished, in no distress, depressed affect  ENT: No lesions,  mouth clear,  oropharynx clear, no postnasal drip  Neck: No JVD, no stridor  Lungs: No use of accessory muscles, very distant, no crackles or wheezing on normal respiration, no wheeze on forced expiration  Cardiovascular: RRR, heart sounds normal, no murmur or gallops, no peripheral edema  Musculoskeletal: No deformities, no cyanosis or clubbing  Neuro: Awake, alert, will answer some simple questions.  Mild dysarthria  Skin: Warm, no lesions or rash      Assessment & Plan:  Malignant neoplasm of lower lobe of right lung (HCC) Difficult to interpret his serial CT scans with increasing right lower lobe consolidation after empiric treatment with XRT.  He has these areas as well as an evolving right upper lobe nodule.  We will perform PET scan to further characterize.  Unclear whether he will be a candidate for any kind of treatment.  He has not been a candidate for chemotherapy since we have been unable to perform biopsies.  The right upper lobe nodule is unreachable by bronchoscopy or TTNA.  Chronic hypoxemic respiratory failure (HCC) Continue  oxygen  at all times as ordered  COPD (chronic obstructive pulmonary disease) (HCC) He was getting home hospital care and Brovona/budesonide  were added.  He is also on Trelegy.  We discussed which 1 they would like to stay on and we will stay on the Trelegy, discontinue the nebulizers.  He will continue to have Ventolin  and also albuterol  nebs available as needed.  Consider daily prednisone  going forward, consider daily azithromycin  going forward  Acute pulmonary  embolism (HCC) New diagnosis in July.  Given his history of malignancy he needs to be on lifelong anticoagulation if he is able to tolerate.  Continue Eliquis .   Time spent 44 minutes  Blake Chris, MD, PhD 10/21/2023, 2:43 PM [facility] [phone] or if no answer before 7:00PM call [phone] For any issues after 7:00PM please call eLink [phone]

## 2023-10-21 NOTE — Addendum Note (Signed)
 Addended by: SHELAH LAMAR RAMAN on: 10/21/2023 04:42 PM   Modules accepted: Orders

## 2023-10-21 NOTE — Addendum Note (Signed)
 Addended byBETHA FRIES, Nyree Yonker A on: 10/21/2023 03:56 PM   Modules accepted: Orders

## 2023-10-21 NOTE — Assessment & Plan Note (Signed)
 He was getting home hospital care and Brovona/budesonide  were added.  He is also on Trelegy.  We discussed which 1 they would like to stay on and we will stay on the Trelegy, discontinue the nebulizers.  He will continue to have Ventolin  and also albuterol  nebs available as needed.  Consider daily prednisone  going forward, consider daily azithromycin  going forward

## 2023-10-21 NOTE — Assessment & Plan Note (Signed)
 Difficult to interpret his serial CT scans with increasing right lower lobe consolidation after empiric treatment with XRT.  He has these areas as well as an evolving right upper lobe nodule.  We will perform PET scan to further characterize.  Unclear whether he will be a candidate for any kind of treatment.  He has not been a candidate for chemotherapy since we have been unable to perform biopsies.  The right upper lobe nodule is unreachable by bronchoscopy or TTNA.

## 2023-10-21 NOTE — Patient Instructions (Addendum)
 We reviewed your CT scans of the chest today.  There is a small right upper lobe nodule that has slightly increased in size.  The area at the bottom of the right lung that was treated with radiation is also more consolidated.  Difficult to know the significance of these findings but lung cancer is a possibility. We will perform a PET scan to evaluate further and decide whether any intervention is needed or can be made. Please continue your oxygen  as you have been using it at all times Stop Brovana  and budesonide  nebulizer treatments Please continue your Trelegy 1 inhalation once daily.  Rinse and gargle after using. Keep your albuterol  available to use either 2 puffs or 1 nebulizer treatment up to every 4 hours if needed for shortness of breath, chest tightness, wheezing. Continue your Eliquis  twice a day as you have been taking it Follow with Dr. Shelah or APP in 1 month so we can review the PET scan and decide whether discussion with radiation oncology would be helpful.

## 2023-10-21 NOTE — Assessment & Plan Note (Signed)
Continue oxygen at all times as ordered 

## 2023-10-22 ENCOUNTER — Other Ambulatory Visit (HOSPITAL_COMMUNITY): Payer: Self-pay

## 2023-10-22 ENCOUNTER — Telehealth: Payer: Self-pay

## 2023-10-22 MED ORDER — APIXABAN 5 MG PO TABS: 5.0000 mg | ORAL_TABLET | Freq: Two times a day (BID) | ORAL | Status: AC

## 2023-10-22 NOTE — Telephone Encounter (Signed)
 Copied from CRM [id number]. Topic: Clinical - Medication Question >> Oct 22, 2023 11:36 AM Rozanna MATSU wrote: Reason for CRM: SISTER [name] CALLING STATED SHE WANTS Blake Mcknight TO FILL HIS apixaban  (ELIQUIS ) 5 MG TABS tablet TAHT HE HAD WHEN HE WAS IN THE HOSPTAL PRESCRIPTION (WHICH IS A NEW ONE). STATE TO HAVE IT SENT TO [facility] ON [address].

## 2023-10-22 NOTE — Telephone Encounter (Signed)
 Per lov Continue your Eliquis  twice a day as you have been taking it  Rx send to pharmacy.

## 2023-10-27 ENCOUNTER — Other Ambulatory Visit: Payer: Self-pay

## 2023-10-27 ENCOUNTER — Encounter (HOSPITAL_COMMUNITY): Payer: Self-pay

## 2023-10-27 ENCOUNTER — Inpatient Hospital Stay (HOSPITAL_COMMUNITY)
Admission: EM | Admit: 2023-10-27 | Discharge: 2023-10-29 | DRG: 189 | Disposition: A | Discharge status: Home / Self Care | Attending: Internal Medicine | Admitting: Internal Medicine

## 2023-10-27 ENCOUNTER — Emergency Department (HOSPITAL_COMMUNITY)

## 2023-10-27 DIAGNOSIS — Z8249 Family history of ischemic heart disease and other diseases of the circulatory system: Secondary | ICD-10-CM

## 2023-10-27 DIAGNOSIS — Z86711 Personal history of pulmonary embolism: Secondary | ICD-10-CM | POA: Diagnosis not present

## 2023-10-27 DIAGNOSIS — R0902 Hypoxemia: Secondary | ICD-10-CM | POA: Diagnosis not present

## 2023-10-27 DIAGNOSIS — J189 Pneumonia, unspecified organism: Secondary | ICD-10-CM | POA: Diagnosis present

## 2023-10-27 DIAGNOSIS — J441 Chronic obstructive pulmonary disease with (acute) exacerbation: Secondary | ICD-10-CM | POA: Diagnosis not present

## 2023-10-27 DIAGNOSIS — R7989 Other specified abnormal findings of blood chemistry: Secondary | ICD-10-CM | POA: Diagnosis present

## 2023-10-27 DIAGNOSIS — Z66 Do not resuscitate: Secondary | ICD-10-CM | POA: Diagnosis present

## 2023-10-27 DIAGNOSIS — Z833 Family history of diabetes mellitus: Secondary | ICD-10-CM

## 2023-10-27 DIAGNOSIS — Z923 Personal history of irradiation: Secondary | ICD-10-CM | POA: Diagnosis not present

## 2023-10-27 DIAGNOSIS — E785 Hyperlipidemia, unspecified: Secondary | ICD-10-CM | POA: Diagnosis not present

## 2023-10-27 DIAGNOSIS — C3431 Malignant neoplasm of lower lobe, right bronchus or lung: Secondary | ICD-10-CM | POA: Diagnosis present

## 2023-10-27 DIAGNOSIS — J439 Emphysema, unspecified: Secondary | ICD-10-CM | POA: Diagnosis present

## 2023-10-27 DIAGNOSIS — Z79899 Other long term (current) drug therapy: Secondary | ICD-10-CM

## 2023-10-27 DIAGNOSIS — E871 Hypo-osmolality and hyponatremia: Secondary | ICD-10-CM | POA: Diagnosis not present

## 2023-10-27 DIAGNOSIS — I700 Atherosclerosis of aorta: Secondary | ICD-10-CM | POA: Diagnosis not present

## 2023-10-27 DIAGNOSIS — R0602 Shortness of breath: Secondary | ICD-10-CM | POA: Diagnosis not present

## 2023-10-27 DIAGNOSIS — F203 Undifferentiated schizophrenia: Secondary | ICD-10-CM | POA: Diagnosis not present

## 2023-10-27 DIAGNOSIS — J9621 Acute and chronic respiratory failure with hypoxia: Principal | ICD-10-CM | POA: Diagnosis present

## 2023-10-27 DIAGNOSIS — Z7951 Long term (current) use of inhaled steroids: Secondary | ICD-10-CM | POA: Diagnosis not present

## 2023-10-27 DIAGNOSIS — F32A Depression, unspecified: Secondary | ICD-10-CM | POA: Diagnosis present

## 2023-10-27 DIAGNOSIS — J440 Chronic obstructive pulmonary disease with acute lower respiratory infection: Secondary | ICD-10-CM | POA: Diagnosis present

## 2023-10-27 DIAGNOSIS — F419 Anxiety disorder, unspecified: Secondary | ICD-10-CM | POA: Diagnosis present

## 2023-10-27 DIAGNOSIS — F17210 Nicotine dependence, cigarettes, uncomplicated: Secondary | ICD-10-CM | POA: Diagnosis present

## 2023-10-27 DIAGNOSIS — R000 Tachycardia, unspecified: Secondary | ICD-10-CM | POA: Diagnosis not present

## 2023-10-27 DIAGNOSIS — I10 Essential (primary) hypertension: Secondary | ICD-10-CM | POA: Diagnosis present

## 2023-10-27 DIAGNOSIS — Z7901 Long term (current) use of anticoagulants: Secondary | ICD-10-CM

## 2023-10-27 DIAGNOSIS — F209 Schizophrenia, unspecified: Secondary | ICD-10-CM | POA: Diagnosis present

## 2023-10-27 DIAGNOSIS — Z7984 Long term (current) use of oral hypoglycemic drugs: Secondary | ICD-10-CM

## 2023-10-27 DIAGNOSIS — Z823 Family history of stroke: Secondary | ICD-10-CM

## 2023-10-27 DIAGNOSIS — Z85118 Personal history of other malignant neoplasm of bronchus and lung: Secondary | ICD-10-CM | POA: Diagnosis not present

## 2023-10-27 DIAGNOSIS — R918 Other nonspecific abnormal finding of lung field: Secondary | ICD-10-CM | POA: Diagnosis not present

## 2023-10-27 LAB — CBC
HCT: 37.2 % — ABNORMAL LOW (ref 39.0–52.0)
HCT: 34.3 % — ABNORMAL LOW (ref 39.0–52.0)
Hemoglobin: 11.8 g/dL — ABNORMAL LOW (ref 13.0–17.0)
Hemoglobin: 10.8 g/dL — ABNORMAL LOW (ref 13.0–17.0)
MCH: 26.9 pg (ref 26.0–34.0)
MCH: 26.9 pg (ref 26.0–34.0)
MCHC: 31.7 g/dL (ref 30.0–36.0)
MCHC: 31.5 g/dL (ref 30.0–36.0)
MCV: 84.7 fL (ref 80.0–100.0)
MCV: 85.5 fL (ref 80.0–100.0)
Platelets: 324 K/uL (ref 150–400)
Platelets: 204 K/uL (ref 150–400)
RBC: 4.39 MIL/uL (ref 4.22–5.81)
RBC: 4.01 MIL/uL — ABNORMAL LOW (ref 4.22–5.81)
RDW: 15.9 % — ABNORMAL HIGH (ref 11.5–15.5)
RDW: 15.9 % — ABNORMAL HIGH (ref 11.5–15.5)
WBC: 11.8 K/uL — ABNORMAL HIGH (ref 4.0–10.5)
WBC: 7.7 K/uL (ref 4.0–10.5)
nRBC: 0 % (ref 0.0–0.2)
nRBC: 0 % (ref 0.0–0.2)

## 2023-10-27 LAB — GLUCOSE, CAPILLARY: Glucose-Capillary: 151 mg/dL — ABNORMAL HIGH (ref 70–99)

## 2023-10-27 LAB — BASIC METABOLIC PANEL WITH GFR
Anion gap: 11 (ref 5–15)
Anion gap: 10 (ref 5–15)
BUN: 6 mg/dL — ABNORMAL LOW (ref 8–23)
BUN: 7 mg/dL — ABNORMAL LOW (ref 8–23)
CO2: 23 mmol/L (ref 22–32)
CO2: 23 mmol/L (ref 22–32)
Calcium: 8.7 mg/dL — ABNORMAL LOW (ref 8.9–10.3)
Calcium: 8.5 mg/dL — ABNORMAL LOW (ref 8.9–10.3)
Chloride: 96 mmol/L — ABNORMAL LOW (ref 98–111)
Chloride: 99 mmol/L (ref 98–111)
Creatinine, Ser: 0.75 mg/dL (ref 0.61–1.24)
Creatinine, Ser: 0.69 mg/dL (ref 0.61–1.24)
GFR, Estimated: >60 mL/min (ref >60)
GFR, Estimated: >60 mL/min (ref >60)
Glucose, Bld: 177 mg/dL — ABNORMAL HIGH (ref 70–99)
Glucose, Bld: 139 mg/dL — ABNORMAL HIGH (ref 70–99)
Potassium: 4.7 mmol/L (ref 3.5–5.1)
Potassium: 4.6 mmol/L (ref 3.5–5.1)
Sodium: 130 mmol/L — ABNORMAL LOW (ref 135–145)
Sodium: 132 mmol/L — ABNORMAL LOW (ref 135–145)

## 2023-10-27 LAB — PROTIME-INR
INR: 1.3 — ABNORMAL HIGH (ref 0.8–1.2)
Prothrombin Time: 17.2 s — ABNORMAL HIGH (ref 11.4–15.2)

## 2023-10-27 LAB — OSMOLALITY, URINE: Osmolality, Ur: 616 mosm/kg (ref 300–900)

## 2023-10-27 LAB — BRAIN NATRIURETIC PEPTIDE: B Natriuretic Peptide: 28.5 pg/mL (ref 0.0–100.0)

## 2023-10-27 LAB — I-STAT VENOUS BLOOD GAS, ED
Acid-base deficit: 1 mmol/L (ref 0.0–2.0)
Bicarbonate: 25.1 mmol/L (ref 20.0–28.0)
Calcium, Ion: 1.15 mmol/L (ref 1.15–1.40)
HCT: 36 % — ABNORMAL LOW (ref 39.0–52.0)
Hemoglobin: 12.2 g/dL — ABNORMAL LOW (ref 13.0–17.0)
O2 Saturation: 95 %
Potassium: 4.7 mmol/L (ref 3.5–5.1)
Sodium: 132 mmol/L — ABNORMAL LOW (ref 135–145)
TCO2: 27 mmol/L (ref 22–32)
pCO2, Ven: 46.7 mmHg (ref 44–60)
pH, Ven: 7.338 (ref 7.25–7.43)
pO2, Ven: 83 mmHg — ABNORMAL HIGH (ref 32–45)

## 2023-10-27 LAB — TROPONIN I (HIGH SENSITIVITY)
Troponin I (High Sensitivity): 11 ng/L (ref <18)
Troponin I (High Sensitivity): 30 ng/L — ABNORMAL HIGH (ref <18)
Troponin I (High Sensitivity): 23 ng/L — ABNORMAL HIGH (ref <18)

## 2023-10-27 LAB — SODIUM, URINE, RANDOM: Sodium, Ur: 56 mmol/L

## 2023-10-27 MED ORDER — ONDANSETRON HCL 4 MG PO TABS: 4.0000 mg | ORAL_TABLET | Freq: Four times a day (QID) | ORAL | Status: DC | PRN

## 2023-10-27 MED ORDER — ONDANSETRON HCL 4 MG/2ML IJ SOLN: 4.0000 mg | Freq: Four times a day (QID) | INTRAMUSCULAR | Status: DC | PRN

## 2023-10-27 MED ORDER — APIXABAN 5 MG PO TABS
5.0000 mg | ORAL_TABLET | Freq: Two times a day (BID) | ORAL | Status: DC
  Administered 2023-10-27 – 2023-10-29 (×9): 5 mg via ORAL
  Filled 2023-10-27 (×5): qty 1

## 2023-10-27 MED ORDER — IPRATROPIUM-ALBUTEROL 0.5-2.5 (3) MG/3ML IN SOLN
3.0000 mL | RESPIRATORY_TRACT | Status: DC | PRN
  Administered 2023-10-27 (×2): 3 mL via RESPIRATORY_TRACT
  Filled 2023-10-27 (×2): qty 3

## 2023-10-27 MED ORDER — CARVEDILOL 6.25 MG PO TABS
6.2500 mg | ORAL_TABLET | Freq: Two times a day (BID) | ORAL | Status: DC
  Administered 2023-10-27 – 2023-10-29 (×9): 6.25 mg via ORAL
  Filled 2023-10-27 (×2): qty 1
  Filled 2023-10-27: qty 2
  Filled 2023-10-27 (×2): qty 1

## 2023-10-27 MED ORDER — MIRTAZAPINE 15 MG PO TABS
30.0000 mg | ORAL_TABLET | Freq: Every day | ORAL | Status: DC
  Administered 2023-10-27 – 2023-10-28 (×4): 30 mg via ORAL
  Filled 2023-10-27 (×2): qty 2

## 2023-10-27 MED ORDER — PREDNISONE 20 MG PO TABS
40.0000 mg | ORAL_TABLET | Freq: Every day | ORAL | Status: DC
  Administered 2023-10-28 – 2023-10-29 (×2): 40 mg via ORAL
  Filled 2023-10-27 (×2): qty 2

## 2023-10-27 MED ORDER — BENZTROPINE MESYLATE 2 MG PO TABS
2.0000 mg | ORAL_TABLET | Freq: Two times a day (BID) | ORAL | Status: DC
  Administered 2023-10-27 – 2023-10-28 (×8): 2 mg via ORAL
  Filled 2023-10-27 (×7): qty 1

## 2023-10-27 MED ORDER — SENNOSIDES-DOCUSATE SODIUM 8.6-50 MG PO TABS: 1.0000 | ORAL_TABLET | Freq: Every evening | ORAL | Status: DC | PRN

## 2023-10-27 MED ORDER — ACETAMINOPHEN 325 MG PO TABS: 650.0000 mg | ORAL_TABLET | Freq: Four times a day (QID) | ORAL | Status: DC | PRN

## 2023-10-27 MED ORDER — SALINE SPRAY 0.65 % NA SOLN: 1.0000 | NASAL | Status: DC | PRN

## 2023-10-27 MED ORDER — IPRATROPIUM-ALBUTEROL 0.5-2.5 (3) MG/3ML IN SOLN
3.0000 mL | Freq: Four times a day (QID) | RESPIRATORY_TRACT | Status: DC
  Administered 2023-10-27 – 2023-10-28 (×6): 3 mL via RESPIRATORY_TRACT
  Filled 2023-10-27 (×3): qty 3

## 2023-10-27 MED ORDER — METHYLPREDNISOLONE SODIUM SUCC 125 MG IJ SOLR
125.0000 mg | Freq: Two times a day (BID) | INTRAMUSCULAR | Status: AC
  Administered 2023-10-27 (×4): 125 mg via INTRAVENOUS
  Filled 2023-10-27 (×2): qty 2

## 2023-10-27 MED ORDER — METHOCARBAMOL 500 MG PO TABS: 750.0000 mg | ORAL_TABLET | Freq: Three times a day (TID) | ORAL | Status: DC | PRN

## 2023-10-27 MED ORDER — SODIUM CHLORIDE 0.9 % IV SOLN
1.0000 g | Freq: Every day | INTRAVENOUS | Status: DC
  Administered 2023-10-27 – 2023-10-28 (×4): 1 g via INTRAVENOUS
  Filled 2023-10-27 (×2): qty 10

## 2023-10-27 MED ORDER — KETAMINE HCL 50 MG/5ML IJ SOSY
20.0000 mg | PREFILLED_SYRINGE | Freq: Once | INTRAMUSCULAR | Status: DC
  Filled 2023-10-27: qty 5

## 2023-10-27 MED ORDER — IPRATROPIUM-ALBUTEROL 0.5-2.5 (3) MG/3ML IN SOLN
3.0000 mL | RESPIRATORY_TRACT | Status: DC
  Administered 2023-10-27 (×8): 3 mL via RESPIRATORY_TRACT
  Filled 2023-10-27 (×4): qty 3

## 2023-10-27 MED ORDER — FAMOTIDINE 20 MG PO TABS
40.0000 mg | ORAL_TABLET | Freq: Two times a day (BID) | ORAL | Status: DC
  Administered 2023-10-27 – 2023-10-29 (×9): 40 mg via ORAL
  Filled 2023-10-27 (×5): qty 2

## 2023-10-27 MED ORDER — HYDROXYZINE HCL 25 MG PO TABS: 25.0000 mg | ORAL_TABLET | Freq: Three times a day (TID) | ORAL | Status: DC | PRN

## 2023-10-27 MED ORDER — ATORVASTATIN CALCIUM 10 MG PO TABS
10.0000 mg | ORAL_TABLET | Freq: Every day | ORAL | Status: DC
  Administered 2023-10-27 – 2023-10-28 (×4): 10 mg via ORAL
  Filled 2023-10-27 (×2): qty 1

## 2023-10-27 MED ORDER — TAMSULOSIN HCL 0.4 MG PO CAPS
0.4000 mg | ORAL_CAPSULE | Freq: Two times a day (BID) | ORAL | Status: DC
  Administered 2023-10-27 – 2023-10-29 (×9): 0.4 mg via ORAL
  Filled 2023-10-27 (×5): qty 1

## 2023-10-27 MED ORDER — SODIUM CHLORIDE 0.9% FLUSH
3.0000 mL | Freq: Two times a day (BID) | INTRAVENOUS | Status: DC
  Administered 2023-10-27 – 2023-10-28 (×8): 3 mL via INTRAVENOUS

## 2023-10-27 MED ORDER — ACETAMINOPHEN 650 MG RE SUPP: 650.0000 mg | Freq: Four times a day (QID) | RECTAL | Status: DC | PRN

## 2023-10-27 MED ORDER — IPRATROPIUM-ALBUTEROL 0.5-2.5 (3) MG/3ML IN SOLN
3.0000 mL | Freq: Once | RESPIRATORY_TRACT | Status: AC
  Administered 2023-10-27 (×2): 3 mL via RESPIRATORY_TRACT
  Filled 2023-10-27: qty 3

## 2023-10-27 MED ORDER — RISPERIDONE 3 MG PO TABS
3.0000 mg | ORAL_TABLET | Freq: Two times a day (BID) | ORAL | Status: DC
  Administered 2023-10-27 – 2023-10-29 (×9): 3 mg via ORAL
  Filled 2023-10-27 (×7): qty 1

## 2023-10-27 NOTE — ED Triage Notes (Signed)
 Pt [facility] after awakening with respiratory distress. EMS reported that pt O2 sat upon arrival was 81%-86% with home neb. Pt placed on CPAP and sat increased to 97%. EMS administered Duoneb x 2, Magnesium  2g IV, and Solumedrol 125mg  IV.

## 2023-10-27 NOTE — Progress Notes (Signed)
 Patient seen and examined in the morning rounds.  Still in the emergency room.  Admitted early morning hours by nighttime hospitalist.  Sister at the bedside who gives additional history.  Patient was also recently admitted with similar presentation.  In brief, 68 year old gentleman with history of hypertension, hyperlipidemia, history of PE on Eliquis , schizophrenia, COPD, lung cancer and chronic hypoxemic failure on 2 L oxygen  at home brought by his sister because he woke up in the middle of the night with shortness of breath, agitated and not cooperating with using nebulizer at home so she called EMS.  EMS found him saturating 80% and wheezing, started on CPAP, given DuoNebs magnesium  and Solu-Medrol  prior to arrival to the ER.  In the emergency room saturating well on CPAP.  Blood pressure stable.  Admitted due to COPD exacerbation.  COPD with acute exacerbation: Agree with admission to monitored unit because of severity of symptoms. Aggressive bronchodilator therapy, IV steroids-Taper to oral steroid, inhalational steroids, scheduled and as needed bronchodilators, deep breathing exercises, incentive spirometry, chest physiotherapy . Antibiotics due to severity of symptoms.  Continue Rocephin  for 5 days.  Will change to oral antibiotics once clinical improvement. Supplemental oxygen  to keep saturations more than 90%. BiPAP for severe respiratory distress.  Resume all home home medications.  Total time spent: 40 minutes.  Same-day admit.  No charge visit.

## 2023-10-27 NOTE — Progress Notes (Signed)
   10/27/23 0752  Oxygen  Therapy/Pulse Ox  O2 Device (S)  Nasal Cannula  O2 Therapy Oxygen   O2 Flow Rate (L/min) 2 L/min   Pt was transitioned from BiPAP to [place]. Pt states he wears oxygen  at home at 2L.Pt showing no signs of distress at this time

## 2023-10-27 NOTE — ED Notes (Signed)
 Pt's wife at bedside.

## 2023-10-27 NOTE — ED Provider Notes (Signed)
 [facility]-EMER[facility]ENCY DEPT [facility] Emergency Department Provider Note MRN:  [id number]  Arrival date & time: 10/27/23     Chief Complaint   Shortness of Breath   History of Present Illness   Blake Mcknight is a 68 y.o. year-old male with a history of COPD, CHF presenting to the ED with chief complaint of shortness of breath.  Worsening shortness of breath this evening, patient was in respiratory distress with EMS.  History of schizophrenia.  Denies chest pain.  Review of Systems  A thorough review of systems was obtained and all systems are negative except as noted in the HPI and PMH.   Patient's Health History    Past Medical History:  Diagnosis Date   Abdominal pain 06/03/2013   Chest pain 08/10/2023   COPD (chronic obstructive pulmonary disease) (HCC)    with bullous emphysema.    COPD exacerbation (HCC) 08/09/2023   Heavy cigarette smoker before 2003   pt claims only 10 cigs per day, never heavier amounts.    HLD (hyperlipidemia)    Hypertension    Schizophrenia (HCC) 06/28/2013   This is a chronic condition and he lives with family    Past Surgical History:  Procedure Laterality Date   CHOLECYSTECTOMY N/A 06/06/2013   Procedure: LAPAROSCOPIC CHOLECYSTECTOMY WITH ATTEMPTED INTRAOPERATIVE CHOLANGIOGRAM;  Surgeon: Dann FORBES Hummer, MD;  Location: [facility] OR;  Service: General;  Laterality: N/A;   ERCP N/A 06/03/2013   Procedure: ENDOSCOPIC RETROGRADE CHOLANGIOPANCREATOGRAPHY (ERCP);  Surgeon: Gwendlyn ONEIDA Buddy, MD;  Location: [facility] ENDOSCOPY;  Service: Endoscopy;  Laterality: N/A;    Family History  Problem Relation Age of Onset   Diabetes Mellitus II Mother    Diabetes Mother    Heart disease Father    Stroke Maternal Aunt    CAD Neg Hx     Social History   Socioeconomic History   Marital status: Single    Spouse name: Not on file   Number of children: Not on file   Years of education: Not on file   Highest education level: Not on file  Occupational History    Occupation: disabled  Tobacco Use   Smoking status: Former    Current packs/day: 1.00    Average packs/day: 1 pack/day for 41.0 years (41.0 ttl pk-yrs)    Types: Cigarettes   Smokeless tobacco: Never   Tobacco comments:    Quit 7 - 8 months ago [facility] 07/17/21  Vaping Use   Vaping status: Never Used  Substance and Sexual Activity   Alcohol use: No   Drug use: No   Sexual activity: Yes  Other Topics Concern   Not on file  Social History Narrative   Not on file   Social Drivers of Health   Financial Resource Strain: Not on file  Food Insecurity: No Food Insecurity (10/08/2023)   Hunger Vital Sign    Worried About Running Out of Food in the Last Year: Never true    Ran Out of Food in the Last Year: Never true  Recent Concern: Food Insecurity - Food Insecurity Present (08/15/2023)   Hunger Vital Sign    Worried About Running Out of Food in the Last Year: Never true    Ran Out of Food in the Last Year: Sometimes true  Transportation Needs: No Transportation Needs (10/08/2023)   PRAPARE - Administrator, Civil Service (Medical): No    Lack of Transportation (Non-Medical): No  Physical Activity: Not on file  Stress: Not on file  Social  Connections: Socially Isolated (10/08/2023)   Social Connection and Isolation Panel    Frequency of Communication with Friends and Family: Never    Frequency of Social Gatherings with Friends and Family: Never    Attends Religious Services: Never    Database administrator or Organizations: No    Attends Banker Meetings: Never    Marital Status: Never married  Intimate Partner Violence: Not At Risk (10/08/2023)   Humiliation, Afraid, Rape, and Kick questionnaire    Fear of Current or Ex-Partner: No    Emotionally Abused: No    Physically Abused: No    Sexually Abused: No     Physical Exam   Vitals:   10/27/23 0215 10/27/23 0230  BP: 123/88 124/82  Pulse: (!) 112 (!) 112  Resp: 16 16  SpO2: 100% 100%     CONSTITUTIONAL: Ill-appearing, moderate to severe respiratory distress NEURO/PSYCH:  Alert and oriented x 3, no focal deficits EYES:  eyes equal and reactive ENT/NECK:  no LAD, no JVD CARDIO: Tachycardic rate, well-perfused, normal S1 and S2 PULM:  CTAB no wheezing or rhonchi GI/GU:  non-distended, non-tender MSK/SPINE:  No gross deformities, no edema SKIN:  no rash, atraumatic   *Additional and/or pertinent findings included in MDM below  Diagnostic and Interventional Summary    EKG Interpretation Date/Time:  Tuesday October 27 2023 01:38:41 EDT Ventricular Rate:  132 PR Interval:  135 QRS Duration:  81 QT Interval:  288 QTC Calculation: 427 R Axis:   89  Text Interpretation: Sinus tachycardia Multiple ventricular premature complexes Borderline right axis deviation Probable anteroseptal infarct, old Minimal ST depression, inferior leads Confirmed by Theadore Sharper [phone]) on 10/27/2023 3:18:57 AM       Labs Reviewed  CBC - Abnormal; Notable for the following components:      Result Value   WBC 11.8 (*)    Hemoglobin 11.8 (*)    HCT 37.2 (*)    RDW 15.9 (*)    All other components within normal limits  BASIC METABOLIC PANEL WITH GFR - Abnormal; Notable for the following components:   Sodium 130 (*)    Chloride 96 (*)    Glucose, Bld 177 (*)    BUN 6 (*)    Calcium  8.7 (*)    All other components within normal limits  PROTIME-INR - Abnormal; Notable for the following components:   Prothrombin Time 17.2 (*)    INR 1.3 (*)    All other components within normal limits  I-STAT VENOUS BLOOD GAS, ED - Abnormal; Notable for the following components:   pO2, Ven 83 (*)    Sodium 132 (*)    HCT 36.0 (*)    Hemoglobin 12.2 (*)    All other components within normal limits  BRAIN NATRIURETIC PEPTIDE  TROPONIN I (HIGH SENSITIVITY)  TROPONIN I (HIGH SENSITIVITY)    DG Chest Port 1 View    (Results Pending)    Medications  ketamine  50 mg in normal saline 5 mL (10 mg/mL)  syringe (0 mg Intravenous Hold 10/27/23 0148)  ipratropium-albuterol  (DUONEB) 0.5-2.5 (3) MG/3ML nebulizer solution 3 mL (has no administration in time range)  ipratropium-albuterol  (DUONEB) 0.5-2.5 (3) MG/3ML nebulizer solution 3 mL (3 mLs Nebulization Given 10/27/23 0148)     Procedures  /  Critical Care Procedures  ED Course and Medical Decision Making  Initial Impression and Ddx Patient presenting in respiratory distress, hypoxic respiratory failure.  Very poor air movement, suspect COPD exacerbation.  On CPAP on arrival  with EMS.  Has received Solu-Medrol , DuoNebs, magnesium .  Per chart review patient is DNR/DNI, history of lung cancer, history of PE already anticoagulated.  Seems to be improving with interventions and BiPAP.  Continuing BiPAP in the emergency department.  Spoke with patient's sister and medical decision maker, confirming DNR/DNI, but comfortable with current management.  He normally can ambulate by himself and at baseline he is on 2 L.  Past medical/surgical history that increases complexity of ED encounter: Schizophrenia, COPD  Interpretation of Diagnostics I personally reviewed the EKG and my interpretation is as follows: Sinus tachycardia  No significant blood count or electrolyte disturbance.  Patient Reassessment and Ultimate Disposition/Management     Plan is for hospitalist admission.  Patient management required discussion with the following services or consulting groups:  Hospitalist Service  Complexity of Problems Addressed Acute illness or injury that poses threat of life of bodily function  Additional Data Reviewed and Analyzed Further history obtained from: EMS on arrival and Further history from spouse/family member  Additional Factors Impacting ED Encounter Risk Consideration of hospitalization  Ozell HERO. Theadore, MD [facility] [email or web address]  Final Clinical Impressions(s) / ED Diagnoses      ICD-10-CM   1. COPD exacerbation (HCC)  J44.1       ED Discharge Orders     None        Discharge Instructions Discussed with and Provided to Patient:   Discharge Instructions   None      Theadore Ozell HERO, MD 10/27/23 [phone]

## 2023-10-27 NOTE — H&P (Signed)
 History and Physical    Blake Mcknight FMW:[id number] DOB: 06/26/1955 DOA: 10/27/2023  PCP: [name], MD   Patient coming from: Home   Chief Complaint: Acute respiratory failure   HPI: Blake Mcknight is a 68 y.o. male with medical history significant for hypertension, hyperlipidemia, history of PE on Eliquis , schizophrenia, COPD, lung cancer, and chronic hypoxic respiratory failure who presents with acute worsening in his dyspnea.  He is accompanied by his sister who assists with the history.  Patient woke overnight with respiratory distress, became agitated, and was not cooperating with his sister as she tried to administer a nebulized breathing treatment.  EMS was called and found him to be saturating in the low 80s.  He was placed on CPAP and treated with DuoNeb x 2, IV magnesium , and IV Solu-Medrol  prior to arrival in the ED.  Prior to this episode, patient's family notes that he had been doing well since the recent hospitalization in terms of his agitation and respiratory status.  ED Course: Upon arrival to the ED, patient is found to be saturating well on CPAP with tachypnea, tachycardia, and stable BP.  Labs are most notable for sodium 130, normal creatinine, WBC 11,800, normal BNP, and normal initial troponin which later increased to 30.  Patient was transition to BiPAP in the ED and treated with DuoNeb x 2.  Review of Systems:  ROS Limited by patient's clinical condition.  Past Medical History:  Diagnosis Date   Abdominal pain 06/03/2013   Chest pain 08/10/2023   COPD (chronic obstructive pulmonary disease) (HCC)    with bullous emphysema.    COPD exacerbation (HCC) 08/09/2023   Heavy cigarette smoker before 2003   pt claims only 10 cigs per day, never heavier amounts.    HLD (hyperlipidemia)    Hypertension    Schizophrenia (HCC) 06/28/2013   This is a chronic condition and he lives with family    Past Surgical History:  Procedure Laterality Date    CHOLECYSTECTOMY N/A 06/06/2013   Procedure: LAPAROSCOPIC CHOLECYSTECTOMY WITH ATTEMPTED INTRAOPERATIVE CHOLANGIOGRAM;  Surgeon: Dann FORBES Hummer, MD;  Location: [facility] OR;  Service: General;  Laterality: N/A;   ERCP N/A 06/03/2013   Procedure: ENDOSCOPIC RETROGRADE CHOLANGIOPANCREATOGRAPHY (ERCP);  Surgeon: Gwendlyn ONEIDA Buddy, MD;  Location: [facility] ENDOSCOPY;  Service: Endoscopy;  Laterality: N/A;    Social History:   reports that he has quit smoking. His smoking use included cigarettes. He has a 41 pack-year smoking history. He has never used smokeless tobacco. He reports that he does not drink alcohol and does not use drugs.  No Known Allergies  Family History  Problem Relation Age of Onset   Diabetes Mellitus II Mother    Diabetes Mother    Heart disease Father    Stroke Maternal Aunt    CAD Neg Hx      Prior to Admission medications   Medication Sig Start Date End Date Taking? Authorizing Provider  acetaminophen  (TYLENOL ) 325 MG tablet Take 2 tablets (650 mg total) by mouth every 6 (six) hours as needed for mild pain (pain score 1-3) or fever (or Fever >/= 101). 10/12/23   Sheikh, Omair Latif, DO  acetaminophen  (TYLENOL ) 500 MG tablet Take 2 tablets (1,000 mg total) by mouth every 8 (eight) hours. 08/14/23   Fausto Burnard LABOR, DO  albuterol  (PROVENTIL ) (2.5 MG/3ML) 0.083% nebulizer solution Take 3 mLs (2.5 mg total) by nebulization every 4 (four) hours as needed for wheezing or shortness of breath. 08/13/23   Fausto Burnard  A, DO  albuterol  (VENTOLIN  HFA) 108 (90 Base) MCG/ACT inhaler Inhale 2 puffs into the lungs every 4 (four) hours as needed for wheezing or shortness of breath. 10/02/22   Bernard Drivers, MD  apixaban  (ELIQUIS ) 5 MG TABS tablet Take 1 tablet (5 mg total) by mouth 2 (two) times daily. 10/22/23   Shelah Lamar RAMAN, MD  arformoterol  (BROVANA ) 15 MCG/2ML NEBU Take 2 mLs (15 mcg total) by nebulization 2 (two) times daily. 09/10/23   Will Almarie MATSU, MD  atorvastatin  (LIPITOR) 10 MG  tablet Take 1 tablet (10 mg total) by mouth at bedtime. 08/14/23   Fausto Burnard LABOR, DO  benztropine  (COGENTIN ) 2 MG tablet Take 1 tablet (2 mg total) by mouth 2 (two) times daily. 07/30/23   Arfeen, Leni DASEN, MD  budesonide  (PULMICORT ) 0.25 MG/2ML nebulizer solution Take 2 mLs (0.25 mg total) by nebulization 2 (two) times daily. 08/14/23 10/21/23  Fausto Burnard LABOR, DO  carvedilol  (COREG ) 3.125 MG tablet Take 2 tablets (6.25 mg total) by mouth 2 (two) times daily with a meal. 09/10/23   Will Almarie MATSU, MD  cetirizine (ZYRTEC) 10 MG tablet Take 10 mg by mouth daily as needed for allergies or rhinitis.    [provider]  cholecalciferol (VITAMIN D) 25 MCG (1000 UNIT) tablet Take 5,000 Units by mouth every morning.    [provider]  famotidine  (PEPCID ) 40 MG tablet Take 40 mg by mouth 2 (two) times daily. 12/28/18   [provider]  feeding supplement, GLUCERNA SHAKE, (GLUCERNA SHAKE) LIQD Take 237 mLs by mouth 3 (three) times daily between meals. 08/14/23   Fausto Burnard LABOR, DO  hydrOXYzine  (ATARAX ) 25 MG tablet Take 1 tablet (25 mg total) by mouth 3 (three) times daily as needed for anxiety. 10/12/23   Sherrill Cable Latif, DO  ibuprofen  (ADVIL ) 400 MG tablet Take 1 tablet (400 mg total) by mouth every 6 (six) hours as needed (moderate to severe pain). 08/14/23   Fausto Burnard LABOR, DO  ipratropium-albuterol  (DUONEB) 0.5-2.5 (3) MG/3ML SOLN Take 3 mLs by nebulization every 4 (four) hours. 08/14/23   Fausto Burnard A, DO  JANUVIA 100 MG tablet Take 100 mg by mouth every evening. 01/30/23   [provider]  lidocaine  (LIDODERM ) 5 % Place 1 patch onto the skin daily. Remove & Discard patch within 12 hours or as directed by MD 09/10/23   Will Almarie MATSU, MD  methocarbamol  (ROBAXIN ) 750 MG tablet Take 1 tablet (750 mg total) by mouth every 8 (eight) hours as needed for muscle spasms. 09/10/23   Will Almarie MATSU, MD  mirtazapine  (REMERON ) 30 MG tablet Take 1 tablet  (30 mg total) by mouth at bedtime as needed (insomnia). Patient taking differently: Take 30 mg by mouth at bedtime. 07/30/23 07/29/24  Arfeen, Leni DASEN, MD  multivitamin (ONE-A-DAY MEN'S) TABS tablet Take 1 tablet by mouth daily.    [provider]  ondansetron  (ZOFRAN ) 4 MG tablet Take 1 tablet (4 mg total) by mouth every 6 (six) hours as needed for nausea. 09/10/23   Will Almarie MATSU, MD  OXYGEN  Inhale 2 L/min into the lungs continuous.     [provider]  polyethylene glycol (MIRALAX  / GLYCOLAX ) 17 g packet Take 17 g by mouth daily. 09/10/23   Will Almarie MATSU, MD  predniSONE  (STERAPRED UNI-PAK 21 TAB) 10 MG (21) TBPK tablet Take 6 tablets on day 1, 5 tablets on day 2, 4 tablets on day 3, 3 tablets on day 4, 2 tablets  on day 5, 1 tablet on day 6 and then stop Patient not taking: Reported on 10/21/2023 10/12/23   Sherrill Cable Latif, DO  risperiDONE  (RISPERDAL ) 3 MG tablet Take 1 tablet (3 mg total) by mouth 2 (two) times daily. 07/30/23   Arfeen, Leni DASEN, MD  sodium chloride  (OCEAN) 0.65 % SOLN nasal spray Place 1 spray into both nostrils as needed for congestion.    [provider]  tamsulosin  (FLOMAX ) 0.4 MG CAPS capsule Take 0.4 mg by mouth in the morning and at bedtime. 07/23/20   [provider]  traMADol  (ULTRAM ) 50 MG tablet Take 1 tablet (50 mg total) by mouth every 6 (six) hours as needed. 10/21/23   Shelah Lamar RAMAN, MD  TRELEGY ELLIPTA  100-62.5-25 MCG/ACT AEPB Inhale 1 puff into the lungs daily as needed (for shortness of breath). 08/29/23   [provider]    Physical Exam: Vitals:   10/27/23 0145 10/27/23 0145 10/27/23 0215 10/27/23 0230  BP: 121/84 121/84 123/88 124/82  Pulse: (!) 126  (!) 112 (!) 112  Resp: (!) 22  16 16   SpO2: 100%  100% 100%    Constitutional: NAD, no pallor or diaphoresis   Eyes: PERTLA, lids and conjunctivae normal ENMT: Mucous membranes are moist. Posterior pharynx clear of any exudate or lesions.   Neck: supple,  no masses  Respiratory: Diminished breath sounds bilaterally with prolonged expiratory phase. No crackles.  Cardiovascular: S1 & S2 heard, regular rate and rhythm. No extremity edema.   Abdomen: No tenderness, soft. Bowel sounds active.  Musculoskeletal: no clubbing / cyanosis. No joint deformity upper and lower extremities.   Skin: no significant rashes, lesions, ulcers. Warm, dry, well-perfused. Neurologic: CN 2-12 grossly intact. Moving all extremities. Sleeping, wakes to voice and oriented to person, place, and situation.  Psychiatric: Calm. Cooperative.    Labs and Imaging on Admission: I have personally reviewed following labs and imaging studies  CBC: Recent Labs  Lab 10/27/23 0137 10/27/23 0138  WBC 11.8*  --   HGB 11.8* 12.2*  HCT 37.2* 36.0*  MCV 84.7  --   PLT 324  --    Basic Metabolic Panel: Recent Labs  Lab 10/27/23 0137 10/27/23 0138  NA 130* 132*  K 4.7 4.7  CL 96*  --   CO2 23  --   GLUCOSE 177*  --   BUN 6*  --   CREATININE 0.75  --   CALCIUM  8.7*  --    GFR: Estimated Creatinine Clearance: 83.1 mL/min (by C-G formula based on SCr of 0.75 mg/dL). Liver Function Tests: No results for input(s): AST, ALT, ALKPHOS, BILITOT, PROT, ALBUMIN in the last 168 hours. No results for input(s): LIPASE, AMYLASE in the last 168 hours. No results for input(s): AMMONIA in the last 168 hours. Coagulation Profile: Recent Labs  Lab 10/27/23 0137  INR 1.3*   Cardiac Enzymes: No results for input(s): CKTOTAL, CKMB, CKMBINDEX, TROPONINI in the last 168 hours. BNP (last 3 results) No results for input(s): PROBNP in the last 8760 hours. HbA1C: No results for input(s): HGBA1C in the last 72 hours. CBG: No results for input(s): GLUCAP in the last 168 hours. Lipid Profile: No results for input(s): CHOL, HDL, LDLCALC, TRIG, CHOLHDL, LDLDIRECT in the last 72 hours. Thyroid Function Tests: No results for input(s): TSH,  T4TOTAL, FREET4, T3FREE, THYROIDAB in the last 72 hours. Anemia Panel: No results for input(s): VITAMINB12, FOLATE, FERRITIN, TIBC, IRON, RETICCTPCT in the last 72 hours. Urine analysis:    Component  Value Date/Time   COLORURINE YELLOW 04/16/2023 0616   APPEARANCEUR CLEAR 04/16/2023 0616   LABSPEC 1.016 04/16/2023 0616   PHURINE 6.0 04/16/2023 0616   GLUCOSEU 150 (A) 04/16/2023 0616   HGBUR NEGATIVE 04/16/2023 0616   BILIRUBINUR NEGATIVE 04/16/2023 0616   KETONESUR NEGATIVE 04/16/2023 0616   PROTEINUR NEGATIVE 04/16/2023 0616   UROBILINOGEN 0.2 01/29/2019 1634   NITRITE NEGATIVE 04/16/2023 0616   LEUKOCYTESUR NEGATIVE 04/16/2023 0616   Sepsis Labs: @LABRCNTIP (procalcitonin:4,lacticidven:4) )No results found for this or any previous visit (from the past 240 hours).   Radiological Exams on Admission: DG Chest Port 1 View Result Date: 10/27/2023 CLINICAL DATA:  Shortness of breath EXAM: PORTABLE CHEST 1 VIEW COMPARISON:  10/11/2023 FINDINGS: Cardiac shadow is stable. Aortic calcifications are seen. Diffuse emphysematous changes with multiple blebs are seen. The overall appearance is stable from the prior exam. Some increased patchy density is noted in the left base medially as well as in the right base. No sizable effusion is seen. No bony abnormality is noted. IMPRESSION: Persistent basilar opacity on the right when compared with the prior exam. New left medial basilar opacity. Electronically Signed   By: Oneil Devonshire M.D.   On: 10/27/2023 03:29    EKG: Independently reviewed. Sinus tachycardia, rate 132, PVCs.   Assessment/Plan   1. COPD exacerbation; acute on chronic hypoxic respiratory failure  - Culture sputum, start antibiotic, continue systemic steroid, continue short-acting bronchodilators, continue BiPAP as-needed, continue supplemental O2    2. Lung cancer  - S/p XRT, followed by pulmonology with plan for upcoming PET scan    3. Elevated troponin  -  Denies chest pain but has mildly elevated troponin in setting of acute respiratory failure  - Continue cardiac monitoring, trend troponin, continue to treat underlying acute respiratory illness   4. Hx of PE  - Continue Eliquis     5. Hyponatremia  - Mild, appears euvolemic, will check urine sodium and urine osm and repeat chem panel in am    6. Schizophrenia, depression, anxiety   - Continue Risperdal , Cogentin , Remeron , and as-needed hydroxyzine     DVT prophylaxis: Eliquis   Code Status: DNR  Level of Care: Level of care: Progressive Family Communication: Sister at bedside  Disposition Plan:  Patient is from: Home  Anticipated d/c is to: TBD Anticipated d/c date is: 10/29/23 Patient currently: Pending improved/stable respiratory status, trend in troponin   Consults called: None  Admission status: Inpatient     Evalene GORMAN Sprinkles, MD [facility]  10/27/2023, 5:05 AM

## 2023-10-28 ENCOUNTER — Inpatient Hospital Stay (HOSPITAL_COMMUNITY)

## 2023-10-28 DIAGNOSIS — J441 Chronic obstructive pulmonary disease with (acute) exacerbation: Secondary | ICD-10-CM | POA: Diagnosis not present

## 2023-10-28 LAB — C-REACTIVE PROTEIN: CRP: 3.7 mg/dL — ABNORMAL HIGH (ref <1.0)

## 2023-10-28 LAB — CBC
HCT: 35.6 % — ABNORMAL LOW (ref 39.0–52.0)
Hemoglobin: 11.7 g/dL — ABNORMAL LOW (ref 13.0–17.0)
MCH: 27 pg (ref 26.0–34.0)
MCHC: 32.9 g/dL (ref 30.0–36.0)
MCV: 82.2 fL (ref 80.0–100.0)
Platelets: 336 K/uL (ref 150–400)
RBC: 4.33 MIL/uL (ref 4.22–5.81)
RDW: 15.3 % (ref 11.5–15.5)
WBC: 8.5 K/uL (ref 4.0–10.5)
nRBC: 0 % (ref 0.0–0.2)

## 2023-10-28 LAB — BRAIN NATRIURETIC PEPTIDE: B Natriuretic Peptide: 68.8 pg/mL (ref 0.0–100.0)

## 2023-10-28 LAB — BASIC METABOLIC PANEL WITH GFR
Anion gap: 9 (ref 5–15)
BUN: 7 mg/dL — ABNORMAL LOW (ref 8–23)
CO2: 25 mmol/L (ref 22–32)
Calcium: 9.1 mg/dL (ref 8.9–10.3)
Chloride: 100 mmol/L (ref 98–111)
Creatinine, Ser: 0.71 mg/dL (ref 0.61–1.24)
GFR, Estimated: >60 mL/min (ref >60)
Glucose, Bld: 163 mg/dL — ABNORMAL HIGH (ref 70–99)
Potassium: 4.7 mmol/L (ref 3.5–5.1)
Sodium: 134 mmol/L — ABNORMAL LOW (ref 135–145)

## 2023-10-28 LAB — PROCALCITONIN: Procalcitonin: 1.03 ng/mL

## 2023-10-28 MED ORDER — BUDESON-GLYCOPYRROL-FORMOTEROL 160-9-4.8 MCG/ACT IN AERO
2.0000 | INHALATION_SPRAY | Freq: Two times a day (BID) | RESPIRATORY_TRACT | Status: DC
  Administered 2023-10-28 – 2023-10-29 (×3): 2 via RESPIRATORY_TRACT
  Filled 2023-10-28 (×2): qty 5.9

## 2023-10-28 MED ORDER — IPRATROPIUM-ALBUTEROL 0.5-2.5 (3) MG/3ML IN SOLN: 3.0000 mL | Freq: Two times a day (BID) | RESPIRATORY_TRACT | Status: DC

## 2023-10-28 MED ORDER — IPRATROPIUM-ALBUTEROL 0.5-2.5 (3) MG/3ML IN SOLN
3.0000 mL | RESPIRATORY_TRACT | Status: DC
  Administered 2023-10-28 – 2023-10-29 (×8): 3 mL via RESPIRATORY_TRACT
  Filled 2023-10-28 (×5): qty 3

## 2023-10-28 MED ORDER — IPRATROPIUM-ALBUTEROL 0.5-2.5 (3) MG/3ML IN SOLN
3.0000 mL | RESPIRATORY_TRACT | Status: DC
  Administered 2023-10-28 (×2): 3 mL via RESPIRATORY_TRACT

## 2023-10-28 MED ORDER — DOXYCYCLINE HYCLATE 100 MG PO TABS
100.0000 mg | ORAL_TABLET | Freq: Two times a day (BID) | ORAL | Status: DC
  Administered 2023-10-28 – 2023-10-29 (×5): 100 mg via ORAL
  Filled 2023-10-28 (×3): qty 1

## 2023-10-28 NOTE — Progress Notes (Signed)
   10/28/23 2348  BiPAP/CPAP/SIPAP  Reason BIPAP/CPAP not in use Other(comment) (Pt tolerating [place] well. No distress noted.)  BiPAP/CPAP /SiPAP Vitals  Resp 18  MEWS Score/Color  MEWS Score 0  MEWS Score Color Green

## 2023-10-28 NOTE — Progress Notes (Signed)
 PROGRESS NOTE                                                                                                                                                                                                             Patient Demographics:    Blake Mcknight, is a 68 y.o. male, DOB - 1956-01-09, FMW:[id number]  Outpatient Primary MD for the patient is [name], MD    LOS - 1  Admit date - 10/27/2023    Chief Complaint  Patient presents with   Shortness of Breath       Brief Narrative (HPI from H&P)    68 y.o. male with medical history significant for hypertension, hyperlipidemia, history of PE on Eliquis , schizophrenia, COPD, lung cancer, and chronic hypoxic respiratory failure who presents with acute worsening in his dyspnea.   He is accompanied by his sister who assists with the history.  Patient woke overnight with respiratory distress, became agitated, and was not cooperating with his sister as she tried to administer a nebulized breathing treatment.  EMS was called and found him to be saturating in the low 80s.   He was placed on CPAP and treated with DuoNeb x 2, IV magnesium , and IV Solu-Medrol  prior to arrival in the ED.   Subjective:    Rydell Wiegel today has, No headache, No chest pain, No abdominal pain - No Nausea, No new weakness tingling or numbness, improve Cough - SOB.    Assessment  & Plan :    1. COPD exacerbation; acute on chronic hypoxic respiratory failure  - Culture sputum, start antibiotic, continue systemic steroid, continue short-acting bronchodilators, improving, continue supplemental O2     2. Lung cancer  - S/p XRT, followed by pulmonology with plan for upcoming PET scan     3. Elevated troponin  - Denies chest pain but has mildly elevated troponin in setting of acute respiratory failure  - Continue cardiac monitoring, trend troponin, continue to treat underlying acute  respiratory illness    4. Hx of PE  - Continue Eliquis      5. Hyponatremia  - Mild, appears euvolemic, will check urine sodium and urine osm and repeat chem panel in am     6. Schizophrenia, depression, anxiety   - Continue Risperdal , Cogentin , Remeron , and as-needed hydroxyzine   Condition - Extremely Guarded  Family Communication  :  sister Blake Mcknight [phone]   Code Status :  DNR  Consults  :     PUD Prophylaxis :  Pepcid    Procedures  :            Disposition Plan  :    Status is: Inpatient   DVT Prophylaxis  :     apixaban  (ELIQUIS ) tablet 5 mg     Lab Results  Component Value Date   PLT 336 10/28/2023    Diet :  Diet Order             Diet regular Room service appropriate? Yes; Fluid consistency: Thin  Diet effective now                    Inpatient Medications  Scheduled Meds:  apixaban   5 mg Oral BID   atorvastatin   10 mg Oral QHS   benztropine   2 mg Oral BID   carvedilol   6.25 mg Oral BID WC   famotidine   40 mg Oral BID   ipratropium-albuterol   3 mL Nebulization Q6H   mirtazapine   30 mg Oral QHS   predniSONE   40 mg Oral Q breakfast   risperiDONE   3 mg Oral BID   sodium chloride  flush  3 mL Intravenous Q12H   tamsulosin   0.4 mg Oral BID   Continuous Infusions:  cefTRIAXone  (ROCEPHIN )  IV Stopped (10/27/23 0801)   PRN Meds:.acetaminophen  **OR** acetaminophen , hydrOXYzine , ipratropium-albuterol , methocarbamol , ondansetron  **OR** ondansetron  (ZOFRAN ) IV, senna-docusate, sodium chloride   Antibiotics  :    Anti-infectives (From admission, onward)    Start     Dose/Rate Route Frequency Ordered Stop   10/27/23 0600  cefTRIAXone  (ROCEPHIN ) 1 g in sodium chloride  0.9 % 100 mL IVPB        1 g 200 mL/hr over 30 Minutes Intravenous Daily 10/27/23 0500 11/01/23 0959         Objective:   Vitals:   10/28/23 0000 10/28/23 0323 10/28/23 0800 10/28/23 0833  BP: (!) 142/97 114/76 119/76   Pulse: 83 79 (!) 182   Resp: 20 16  17    Temp: 97.7 F (36.5 C) 98 F (36.7 C) 97.7 F (36.5 C)   TempSrc: Axillary Oral Axillary   SpO2: 96% 99% 93% 94%    Wt Readings from Last 3 Encounters:  10/21/23 64 kg  10/08/23 57 kg  09/30/23 57.6 kg     Intake/Output Summary (Last 24 hours) at 10/28/2023 1005 Last data filed at 10/27/2023 2150 Gross per 24 hour  Intake 243 ml  Output --  Net 243 ml     Physical Exam  Awake  No new F.N deficits, Normal affect [place].AT,PERRAL Supple Neck, No JVD,   Symmetrical Chest wall movement, Good air movement bilaterally, CTAB RRR,No Gallops,Rubs or new Murmurs,  +ve B.Sounds, Abd Soft, No tenderness,   No Cyanosis, Clubbing or edema       Data Review:    Recent Labs  Lab 10/27/23 0137 10/27/23 0138 10/27/23 0546 10/28/23 0434  WBC 11.8*  --  7.7 8.5  HGB 11.8* 12.2* 10.8* 11.7*  HCT 37.2* 36.0* 34.3* 35.6*  PLT 324  --  204 336  MCV 84.7  --  85.5 82.2  MCH 26.9  --  26.9 27.0  MCHC 31.7  --  31.5 32.9  RDW 15.9*  --  15.9* 15.3    Recent Labs  Lab 10/27/23 0137 10/27/23 0138 10/27/23 0546 10/28/23 0434  NA 130* 132* 132* 134*  K 4.7 4.7 4.6 4.7  CL 96*  --  99 100  CO2 23  --  23 25  ANIONGAP 11  --  10 9  GLUCOSE 177*  --  139* 163*  BUN 6*  --  7* 7*  CREATININE 0.75  --  0.69 0.71  INR 1.3*  --   --   --   BNP 28.5  --   --   --   CALCIUM  8.7*  --  8.5* 9.1      Recent Labs  Lab 10/27/23 0137 10/27/23 0546 10/28/23 0434  INR 1.3*  --   --   BNP 28.5  --   --   CALCIUM  8.7* 8.5* 9.1    --------------------------------------------------------------------------------------------------------------- No results found for: CHOL, HDL, LDLCALC, LDLDIRECT, TRIG, CHOLHDL  Lab Results  Component Value Date   HGBA1C 5.9 (H) 08/10/2023     Micro Results No results found for this or any previous visit (from the past 240 hours).  Radiology Report DG Chest Port 1 View Result Date: 10/27/2023 CLINICAL DATA:  Shortness of breath  EXAM: PORTABLE CHEST 1 VIEW COMPARISON:  10/11/2023 FINDINGS: Cardiac shadow is stable. Aortic calcifications are seen. Diffuse emphysematous changes with multiple blebs are seen. The overall appearance is stable from the prior exam. Some increased patchy density is noted in the left base medially as well as in the right base. No sizable effusion is seen. No bony abnormality is noted. IMPRESSION: Persistent basilar opacity on the right when compared with the prior exam. New left medial basilar opacity. Electronically Signed   By: Oneil Devonshire M.D.   On: 10/27/2023 03:29     Signature  -   Lavada Stank M.D on 10/28/2023 at 10:05 AM   -  To page go to [email or web address]

## 2023-10-28 NOTE — Evaluation (Signed)
 Physical Therapy Evaluation Patient Details Name: Blake Mcknight MRN: [id number] DOB: [birth year] Today's Date: 10/28/2023  History of Present Illness  68 y.o. male who presents with acute worsening in his dyspnea. Past medical history significant for hypertension, hyperlipidemia, history of PE on Eliquis , schizophrenia, COPD, lung cancer, and chronic hypoxic respiratory failure.  Clinical Impression  Pt presents with admitting diagnosis above. Pt only agreeable to ambulate to door if given a shasta. SpO2 briefly dropped to 86% on 2L however very quickly recovered to 97%. Pt overall steady with CGA no AD. PTA pt sister reports that he is mostly a household ambulator however she assists with ADLs and stairs. Pt sister also reports that he was active with HHPT and HHOT therefore recommend pt resume home health services upon DC. PT will continue to follow. Pt would benefit from continued mobility with mobility specialist during acute stay.        If plan is discharge home, recommend the following: A little help with bathing/dressing/bathroom;Assistance with cooking/housework;Assist for transportation   Can travel by private vehicle        Equipment Recommendations None recommended by PT  Recommendations for Other Services       Functional Status Assessment Patient has had a recent decline in their functional status and demonstrates the ability to make significant improvements in function in a reasonable and predictable amount of time.     Precautions / Restrictions Precautions Precautions: Fall;Other (comment) Recall of Precautions/Restrictions: Impaired Restrictions Weight Bearing Restrictions Per Provider Order: No      Mobility  Bed Mobility Overal bed mobility: Independent                  Transfers Overall transfer level: Needs assistance Equipment used: None Transfers: Sit to/from Stand Sit to Stand: Contact guard assist           General transfer comment:  CGA for safety due to pt impulsivity    Ambulation/Gait Ambulation/Gait assistance: +2 safety/equipment, Contact guard assist Gait Distance (Feet): 20 Feet Assistive device: None Gait Pattern/deviations: Step-through pattern, Decreased stride length Gait velocity: decreased     General Gait Details: slow, fairly steady gait without DME. supervision for safety and O2/line management. Pt only agreeable to ambulate to door if given a shasta. SpO2 briefly dropped to 86% on 2L however very quickly recovered to 97%  Stairs            Wheelchair Mobility     Tilt Bed    Modified Rankin (Stroke Patients Only)       Balance Overall balance assessment: Needs assistance Sitting-balance support: No upper extremity supported, Feet supported Sitting balance-Leahy Scale: Good     Standing balance support: No upper extremity supported, During functional activity Standing balance-Leahy Scale: Fair                               Pertinent Vitals/Pain Pain Assessment Pain Assessment: Faces Faces Pain Scale: Hurts a little bit Pain Location: L side Pain Descriptors / Indicators: Sore, Grimacing, Discomfort Pain Intervention(s): Monitored during session, Limited activity within patient's tolerance    Home Living Family/patient expects to be discharged to:: Private residence Living Arrangements: Other relatives Available Help at Discharge: Family;Available 24 hours/day Type of Home: House Home Access: Stairs to enter Entrance Stairs-Rails: None Entrance Stairs-Number of Steps: 2   Home Layout: One level Home Equipment: Wheelchair - Biomedical scientist (2 wheels) Additional Comments: lives with sister who  does her own thing (Pulled from previous admission. Pt sister reports no changes since previous admission.)    Prior Function Prior Level of Function : Needs assist             Mobility Comments: indep without DME; limited household ambulator  secondary to baseline SOB. ADLs Comments: uses shower chair for bathing, reports mod indep with this and ADLs. sister assists with iADLs, including cooking, cleaning, med management, driving     Extremity/Trunk Assessment   Upper Extremity Assessment Upper Extremity Assessment: Overall WFL for tasks assessed    Lower Extremity Assessment Lower Extremity Assessment: Overall WFL for tasks assessed    Cervical / Trunk Assessment Cervical / Trunk Assessment: Normal  Communication   Communication Communication: Impaired Factors Affecting Communication: Difficulty expressing self    Cognition Arousal: Lethargic Behavior During Therapy: Flat affect, Impulsive   PT - Cognitive impairments: History of cognitive impairments                       PT - Cognition Comments: h/o schizophrenia. requires some repetition with questions, seemingly answering appropriately. follows simple commands appropriately. soft speech difficult to understand at times Following commands: Impaired Following commands impaired: Follows one step commands with increased time, Follows one step commands inconsistently     Cueing Cueing Techniques: Verbal cues, Tactile cues     General Comments General comments (skin integrity, edema, etc.): VSS on 2L    Exercises     Assessment/Plan    PT Assessment Patient needs continued PT services  PT Problem List Decreased strength;Decreased activity tolerance;Decreased balance;Decreased mobility;Decreased cognition;Decreased knowledge of use of DME;Cardiopulmonary status limiting activity;Pain       PT Treatment Interventions DME instruction;Gait training;Stair training;Functional mobility training;Therapeutic activities;Therapeutic exercise;Balance training;Neuromuscular re-education;Cognitive remediation;Patient/family education    PT Goals (Current goals can be found in the Care Plan section)  Acute Rehab PT Goals Patient Stated Goal: to go home PT  Goal Formulation: With patient Time For Goal Achievement: 11/11/23 Potential to Achieve Goals: Good    Frequency Min 2X/week     Co-evaluation               AM-PAC PT 6 Clicks Mobility  Outcome Measure Help needed turning from your back to your side while in a flat bed without using bedrails?: None Help needed moving from lying on your back to sitting on the side of a flat bed without using bedrails?: None Help needed moving to and from a bed to a chair (including a wheelchair)?: None Help needed standing up from a chair using your arms (e.g., wheelchair or bedside chair)?: None Help needed to walk in hospital room?: A Little Help needed climbing 3-5 steps with a railing? : A Little 6 Click Score: 22    End of Session Equipment Utilized During Treatment: Oxygen  Activity Tolerance: Patient tolerated treatment well Patient left: in bed;with call bell/phone within reach;with bed alarm set;with family/visitor present (Sister present) Nurse Communication: Mobility status PT Visit Diagnosis: Unsteadiness on feet (R26.81);Other abnormalities of gait and mobility (R26.89);Muscle weakness (generalized) (M62.81);Other symptoms and signs involving the nervous system (R29.898)    Time: 8881-8849 PT Time Calculation (min) (ACUTE ONLY): 32 min   Charges:   PT Evaluation $PT Eval Moderate Complexity: 1 Mod PT Treatments $Gait Training: 8-22 mins PT General Charges $$ ACUTE PT VISIT: 1 Visit         Sueellen NOVAK, PT, DPT Acute Rehab Services [phone]   Anjolaoluwa Siguenza 10/28/2023, 12:21  PM

## 2023-10-28 NOTE — Plan of Care (Signed)

## 2023-10-29 ENCOUNTER — Other Ambulatory Visit (HOSPITAL_COMMUNITY): Payer: Self-pay

## 2023-10-29 ENCOUNTER — Inpatient Hospital Stay (HOSPITAL_COMMUNITY): Admission: RE | Admit: 2023-10-29 | Source: Ambulatory Visit

## 2023-10-29 DIAGNOSIS — J441 Chronic obstructive pulmonary disease with (acute) exacerbation: Secondary | ICD-10-CM | POA: Diagnosis not present

## 2023-10-29 LAB — BASIC METABOLIC PANEL WITH GFR
Anion gap: 10 (ref 5–15)
BUN: 9 mg/dL (ref 8–23)
CO2: 26 mmol/L (ref 22–32)
Calcium: 9 mg/dL (ref 8.9–10.3)
Chloride: 99 mmol/L (ref 98–111)
Creatinine, Ser: 0.76 mg/dL (ref 0.61–1.24)
GFR, Estimated: >60 mL/min (ref >60)
Glucose, Bld: 109 mg/dL — ABNORMAL HIGH (ref 70–99)
Potassium: 3.8 mmol/L (ref 3.5–5.1)
Sodium: 135 mmol/L (ref 135–145)

## 2023-10-29 LAB — PROCALCITONIN: Procalcitonin: 0.69 ng/mL

## 2023-10-29 LAB — CBC WITH DIFFERENTIAL/PLATELET
Abs Immature Granulocytes: 0.05 K/uL (ref 0.00–0.07)
Basophils Absolute: 0 K/uL (ref 0.0–0.1)
Basophils Relative: 0 %
Eosinophils Absolute: 0 K/uL (ref 0.0–0.5)
Eosinophils Relative: 0 %
HCT: 34.3 % — ABNORMAL LOW (ref 39.0–52.0)
Hemoglobin: 11.3 g/dL — ABNORMAL LOW (ref 13.0–17.0)
Immature Granulocytes: 0 %
Lymphocytes Relative: 10 %
Lymphs Abs: 1.2 K/uL (ref 0.7–4.0)
MCH: 27.4 pg (ref 26.0–34.0)
MCHC: 32.9 g/dL (ref 30.0–36.0)
MCV: 83.3 fL (ref 80.0–100.0)
Monocytes Absolute: 1 K/uL (ref 0.1–1.0)
Monocytes Relative: 8 %
Neutro Abs: 9.6 K/uL — ABNORMAL HIGH (ref 1.7–7.7)
Neutrophils Relative %: 82 %
Platelets: 343 K/uL (ref 150–400)
RBC: 4.12 MIL/uL — ABNORMAL LOW (ref 4.22–5.81)
RDW: 15.8 % — ABNORMAL HIGH (ref 11.5–15.5)
WBC: 11.9 K/uL — ABNORMAL HIGH (ref 4.0–10.5)
nRBC: 0 % (ref 0.0–0.2)

## 2023-10-29 LAB — C-REACTIVE PROTEIN: CRP: 1.2 mg/dL — ABNORMAL HIGH (ref <1.0)

## 2023-10-29 LAB — MAGNESIUM: Magnesium: 2.1 mg/dL (ref 1.7–2.4)

## 2023-10-29 MED ORDER — IPRATROPIUM-ALBUTEROL 0.5-2.5 (3) MG/3ML IN SOLN
3.0000 mL | Freq: Three times a day (TID) | RESPIRATORY_TRACT | Status: DC
  Filled 2023-10-29: qty 3

## 2023-10-29 MED ORDER — METHYLPREDNISOLONE 4 MG PO TBPK
ORAL_TABLET | ORAL | 0 refills | Status: DC
Start: 2023-10-29 — End: 2023-11-08
  Filled 2023-10-29: qty 21, 6d supply, fill #0

## 2023-10-29 MED ORDER — DOXYCYCLINE HYCLATE 100 MG PO TABS
100.0000 mg | ORAL_TABLET | Freq: Two times a day (BID) | ORAL | 0 refills | Status: DC
  Filled 2023-10-29: qty 8, 4d supply, fill #0

## 2023-10-29 MED ORDER — CEPHALEXIN 500 MG PO CAPS
500.0000 mg | ORAL_CAPSULE | Freq: Three times a day (TID) | ORAL | 0 refills | Status: AC
  Filled 2023-10-29: qty 12, 4d supply, fill #0

## 2023-10-29 NOTE — Plan of Care (Signed)

## 2023-10-29 NOTE — Progress Notes (Signed)
 Extensive conversation with sister at bedside.   She expressed verbal understanding of plan of care and medication.  VSS.  Patient maintained on 2L [place] without distress.  Lonell RN ok with discharge with no barriers.  Transporting patient with oxygen  safely to Entrance C.

## 2023-10-29 NOTE — Discharge Instructions (Signed)
 Follow with Primary MD [name], MD in 7 days   Get CBC, CMP, Magnesium , 2 view Chest X ray -  checked next visit with your primary MD   Activity: As tolerated with Full fall precautions use walker/cane & assistance as needed  Disposition Home    Diet: Heart Healthy    Special Instructions: If you have smoked or chewed Tobacco  in the last 2 yrs please stop smoking, stop any regular Alcohol  and or any Recreational drug use.  On your next visit with your primary care physician please Get Medicines reviewed and adjusted.  Please request your Prim.MD to go over all Hospital Tests and Procedure/Radiological results at the follow up, please get all Hospital records sent to your Prim MD by signing hospital release before you go home.  If you experience worsening of your admission symptoms, develop shortness of breath, life threatening emergency, suicidal or homicidal thoughts you must seek medical attention immediately by calling 911 or calling your MD immediately  if symptoms less severe.  You Must read complete instructions/literature along with all the possible adverse reactions/side effects for all the Medicines you take and that have been prescribed to you. Take any new Medicines after you have completely understood and accpet all the possible adverse reactions/side effects.   Do not drive when taking Pain medications.  Do not take more than prescribed Pain, Sleep and Anxiety Medications  Wear Seat belts while driving.

## 2023-10-29 NOTE — TOC Transition Note (Signed)
 Transition of Care [facility]) - Discharge Note   Patient Details  Name: Blake Mcknight MRN: [id number] Date of Birth: 09-08-1955  [facility] of [facility]) CM/SW Contact:  Corean JAYSON Canary, RN Phone Number: 10/29/2023, 8:40 AM   Clinical Narrative:     Patient is active with [facility] for home health PT and RN.  He will return home today he is on his baseline oxygen   No other needs identified         Patient Goals and CMS Choice            Discharge Placement                       Discharge Plan and Services Additional resources added to the After Visit Summary for                            HH Arranged: PT, RN [facility] Agency: [facility] Date [facility] Agency Contacted: 10/29/23 Time HH Agency Contacted: 0840 Representative spoke with at [facility] Agency: Channing Ee  Social Drivers of Health (SDOH) Interventions SDOH Screenings   Food Insecurity: No Food Insecurity (10/27/2023)  Recent Concern: Food Insecurity - Food Insecurity Present (08/15/2023)  Housing: Low Risk  (10/27/2023)  Transportation Needs: No Transportation Needs (10/27/2023)  Utilities: Not At Risk (10/27/2023)  Recent Concern: Utilities - At Risk (08/15/2023)  Social Connections: Socially Isolated (10/27/2023)  Tobacco Use: Medium Risk (10/27/2023)     Readmission Risk Interventions    10/12/2023    3:23 PM 04/17/2023   11:09 AM  Readmission Risk Prevention Plan  Transportation Screening Complete Complete  PCP or Specialist Appt within 5-7 Days  Complete  Home Care Screening  Complete  Medication Review (RN CM)  Complete  Medication Review [employer]) Complete   Palliative Care Screening Not Applicable   Skilled Nursing Facility Not Applicable

## 2023-10-29 NOTE — Progress Notes (Signed)
 Spoke with sister [place] via telephone.  Reviewed AVS education.  Sister expressed verbal understanding of plan of care.  AVS placed in chart.  Sister states she will pick him up for discharge.   Lonell RN updated.

## 2023-10-29 NOTE — Discharge Summary (Signed)
 Blake Mcknight FMW:[id number] DOB: 1955-12-27 DOA: 10/27/2023  PCP: [name], MD  Admit date: 10/27/2023  Discharge date: 10/29/2023  Admitted From: Home   Disposition:  Home   Recommendations for Outpatient Follow-up:   Follow up with PCP in 1-2 weeks  PCP Please obtain BMP/CBC, 2 view CXR in 1week,  (see Discharge instructions)   PCP Please follow up on the following pending results:    Home Health: PT, RN if qulifies   Equipment/Devices: None  Consultations: None  Discharge Condition: Stable    CODE STATUS: Full    Diet Recommendation: Heart Healthy     Chief Complaint  Patient presents with   Shortness of Breath     Brief history of present illness from the day of admission and additional interim summary    68 y.o. male with medical history significant for hypertension, hyperlipidemia, history of PE on Eliquis , schizophrenia, COPD, lung cancer, and chronic hypoxic respiratory failure who presents with acute worsening in his dyspnea.   He is accompanied by his sister who assists with the history.  Patient woke overnight with respiratory distress, became agitated, and was not cooperating with his sister as she tried to administer a nebulized breathing treatment.  EMS was called and found him to be saturating in the low 80s.   He was placed on CPAP and treated with DuoNeb x 2, IV magnesium , and IV Solu-Medrol  prior to arrival in the ED.                                                                   Hospital Course   1. COPD exacerbation; acute on chronic hypoxic respiratory failure with possibility of pneumonia - He was placed on IV antibiotics along with steroids, significant improvement, back to baseline, uses 2 L oxygen  at home and he is currently on it and symptom-free, cultures  negative, sitting in chair without any distress, will be placed on 4 more days of oral antibiotic, Medrol  Dosepak and discharged home with PCP and pulmonary follow-up.  He already follows with Dr. Byrum for a possible lung mass/lung cancer.   2. Lung cancer  - S/p XRT, followed by pulmonology with plan for upcoming PET scan discharge follow-up with pulmonologist and PCP.   3. Elevated troponin  - Denies chest pain but has mildly elevated troponin in setting of acute respiratory failure  - Troponin trend was flat and downtrending, and non-ACS pattern, no acute issue.   4. Hx of PE  - Continue Eliquis      5. Hyponatremia  - Mild, appears euvolemic, PCP to recheck in 7 to 10 days.   6. Schizophrenia, depression, anxiety   - Continue Risperdal , Cogentin , Remeron , and as-needed hydroxyzine     Discharge diagnosis     Principal Problem:   COPD  with acute exacerbation (HCC) Active Problems:   Acute on chronic respiratory failure with hypoxia (HCC)   History of pulmonary embolism   Schizophrenia (HCC)   Hyponatremia   Malignant neoplasm of lower lobe of right lung (HCC)   Elevated troponin    Discharge instructions    Discharge Instructions     Diet - low sodium heart healthy   Complete by: As directed    Discharge instructions   Complete by: As directed    Follow with Primary MD [name], MD in 7 days   Get CBC, CMP, Magnesium , 2 view Chest X ray -  checked next visit with your primary MD   Activity: As tolerated with Full fall precautions use walker/cane & assistance as needed  Disposition Home    Diet: Heart Healthy    Special Instructions: If you have smoked or chewed Tobacco  in the last 2 yrs please stop smoking, stop any regular Alcohol  and or any Recreational drug use.  On your next visit with your primary care physician please Get Medicines reviewed and adjusted.  Please request your Prim.MD to go over all Hospital Tests and Procedure/Radiological  results at the follow up, please get all Hospital records sent to your Prim MD by signing hospital release before you go home.  If you experience worsening of your admission symptoms, develop shortness of breath, life threatening emergency, suicidal or homicidal thoughts you must seek medical attention immediately by calling 911 or calling your MD immediately  if symptoms less severe.  You Must read complete instructions/literature along with all the possible adverse reactions/side effects for all the Medicines you take and that have been prescribed to you. Take any new Medicines after you have completely understood and accpet all the possible adverse reactions/side effects.   Do not drive when taking Pain medications.  Do not take more than prescribed Pain, Sleep and Anxiety Medications  Wear Seat belts while driving.   Increase activity slowly   Complete by: As directed        Discharge Medications   Allergies as of 10/29/2023   No Known Allergies      Medication List     STOP taking these medications    ondansetron  4 MG tablet Commonly known as: ZOFRAN        TAKE these medications    acetaminophen  500 MG tablet Commonly known as: TYLENOL  Take 2 tablets (1,000 mg total) by mouth every 8 (eight) hours. What changed:  when to take this reasons to take this   albuterol  108 (90 Base) MCG/ACT inhaler Commonly known as: VENTOLIN  HFA Inhale 2 puffs into the lungs every 4 (four) hours as needed for wheezing or shortness of breath.   albuterol  (2.5 MG/3ML) 0.083% nebulizer solution Commonly known as: PROVENTIL  Take 3 mLs (2.5 mg total) by nebulization every 4 (four) hours as needed for wheezing or shortness of breath.   apixaban  5 MG Tabs tablet Commonly known as: ELIQUIS  Take 1 tablet (5 mg total) by mouth 2 (two) times daily.   arformoterol  15 MCG/2ML Nebu Commonly known as: BROVANA  Take 2 mLs (15 mcg total) by nebulization 2 (two) times daily.   atorvastatin  10 MG  tablet Commonly known as: LIPITOR Take 1 tablet (10 mg total) by mouth at bedtime.   benztropine  2 MG tablet Commonly known as: COGENTIN  Take 1 tablet (2 mg total) by mouth 2 (two) times daily.   carvedilol  3.125 MG tablet Commonly known as: COREG  Take 2 tablets (6.25 mg total)  by mouth 2 (two) times daily with a meal.   cephALEXin  500 MG capsule Commonly known as: KEFLEX  Take 1 capsule (500 mg total) by mouth 3 (three) times daily for 4 days.   cetirizine 10 MG tablet Commonly known as: ZYRTEC Take 10 mg by mouth daily as needed for allergies or rhinitis.   cholecalciferol 25 MCG (1000 UNIT) tablet Commonly known as: VITAMIN D3 Take 5,000 Units by mouth every morning.   cyanocobalamin  1000 MCG tablet Take 1,000 mcg by mouth daily.   doxycycline  100 MG tablet Commonly known as: VIBRA -TABS Take 1 tablet (100 mg total) by mouth every 12 (twelve) hours.   famotidine  40 MG tablet Commonly known as: PEPCID  Take 40 mg by mouth 2 (two) times daily.   feeding supplement (GLUCERNA SHAKE) Liqd Take 237 mLs by mouth 3 (three) times daily between meals.   hydrOXYzine  25 MG tablet Commonly known as: ATARAX  Take 1 tablet (25 mg total) by mouth 3 (three) times daily as needed for anxiety.   ipratropium-albuterol  0.5-2.5 (3) MG/3ML Soln Commonly known as: DUONEB Take 3 mLs by nebulization every 4 (four) hours. What changed:  when to take this reasons to take this   Januvia 100 MG tablet Generic drug: sitaGLIPtin Take 100 mg by mouth every evening.   lidocaine  5 % Commonly known as: LIDODERM  Place 1 patch onto the skin daily. Remove & Discard patch within 12 hours or as directed by MD   methocarbamol  750 MG tablet Commonly known as: ROBAXIN  Take 1 tablet (750 mg total) by mouth every 8 (eight) hours as needed for muscle spasms.   methylPREDNISolone  4 MG Tbpk tablet Commonly known as: MEDROL  DOSEPAK follow package directions   mirtazapine  30 MG tablet Commonly known  as: REMERON  Take 1 tablet (30 mg total) by mouth at bedtime as needed (insomnia).   multivitamin Tabs tablet Take 1 tablet by mouth daily.   OXYGEN  Inhale 2 L/min into the lungs continuous.   polyethylene glycol 17 g packet Commonly known as: MIRALAX  / GLYCOLAX  Take 17 g by mouth daily.   risperiDONE  3 MG tablet Commonly known as: RISPERDAL  Take 1 tablet (3 mg total) by mouth 2 (two) times daily.   sodium chloride  0.65 % Soln nasal spray Commonly known as: OCEAN Place 1 spray into both nostrils as needed for congestion.   tamsulosin  0.4 MG Caps capsule Commonly known as: FLOMAX  Take 0.4 mg by mouth in the morning and at bedtime.   traMADol  50 MG tablet Commonly known as: ULTRAM  Take 1 tablet (50 mg total) by mouth every 6 (six) hours as needed.   Trelegy Ellipta  100-62.5-25 MCG/ACT Aepb Generic drug: Fluticasone -Umeclidin-Vilant Inhale 1 puff into the lungs daily.         Follow-up Information     Care, [facility] Follow up.   Why: Home health provider Contact information: [address] [place] [address] [phone]         [name], MD. Schedule an appointment as soon as possible for a visit in 1 week(s).   Specialty: Pulmonary Disease Contact information: [address] [place] [address] [phone]         Shelah Lamar RAMAN, MD. Schedule an appointment as soon as possible for a visit in 1 week(s).   Specialty: Pulmonary Disease Contact information: [address] [place] [address] [phone]                 Major procedures and Radiology Reports -  PLEASE review detailed and final reports thoroughly  -      DG Chest Port 1 View Result Date: 10/28/2023 CLINICAL DATA:  Shortness of breath. EXAM: PORTABLE CHEST 1 VIEW COMPARISON:  Radiograph yesterday., CT 09/30/2022 FINDINGS: Advanced emphysema. Medial lung base opacity is unchanged from yesterday's exam. Minimal opacity in the  medial left lung base, unchanged. Stable heart size and mediastinal contours. Chronic blunting of the costophrenic angles. No pneumothorax. IMPRESSION: Advanced emphysema. Medial lung base opacity is unchanged from yesterday's exam, corresponding to masslike opacity on prior CT. Unchanged medial left lung base opacity. Electronically Signed   By: Andrea Gasman M.D.   On: 10/28/2023 13:27   DG Chest Port 1 View Result Date: 10/27/2023 CLINICAL DATA:  Shortness of breath EXAM: PORTABLE CHEST 1 VIEW COMPARISON:  10/11/2023 FINDINGS: Cardiac shadow is stable. Aortic calcifications are seen. Diffuse emphysematous changes with multiple blebs are seen. The overall appearance is stable from the prior exam. Some increased patchy density is noted in the left base medially as well as in the right base. No sizable effusion is seen. No bony abnormality is noted. IMPRESSION: Persistent basilar opacity on the right when compared with the prior exam. New left medial basilar opacity. Electronically Signed   By: Oneil Devonshire M.D.   On: 10/27/2023 03:29    Micro Results     No results found for this or any previous visit (from the past 240 hours).  Today   Subjective    Blake Mcknight today has no headache,no chest abdominal pain,no new weakness tingling or numbness, feels much better wants to go home today.     Objective   Blood pressure 145/85, pulse 85, temperature 97.7 F (36.5 C), temperature source Oral, resp. rate 18, SpO2 98%.   Intake/Output Summary (Last 24 hours) at 10/29/2023 0808 Last data filed at 10/29/2023 0645 Gross per 24 hour  Intake 340 ml  Output 775 ml  Net -435 ml    Exam  Awake Alert, No new F.N deficits,    [place].AT,PERRAL Supple Neck,   Symmetrical Chest wall movement, Good air movement bilaterally, CTAB RRR,No Gallops,   +ve B.Sounds, Abd Soft, Non tender,  No Cyanosis, Clubbing or edema    Data Review   Recent Labs  Lab 10/27/23 0137 10/27/23 0138  10/27/23 0546 10/28/23 0434 10/29/23 0628  WBC 11.8*  --  7.7 8.5 11.9*  HGB 11.8* 12.2* 10.8* 11.7* 11.3*  HCT 37.2* 36.0* 34.3* 35.6* 34.3*  PLT 324  --  204 336 343  MCV 84.7  --  85.5 82.2 83.3  MCH 26.9  --  26.9 27.0 27.4  MCHC 31.7  --  31.5 32.9 32.9  RDW 15.9*  --  15.9* 15.3 15.8*  LYMPHSABS  --   --   --   --  1.2  MONOABS  --   --   --   --  1.0  EOSABS  --   --   --   --  0.0  BASOSABS  --   --   --   --  0.0    Recent Labs  Lab 10/27/23 0137 10/27/23 0138 10/27/23 0546 10/28/23 0434 10/28/23 1030 10/28/23 1112 10/29/23 0628  NA 130* 132* 132* 134*  --   --  135  K 4.7 4.7 4.6 4.7  --   --  3.8  CL 96*  --  99 100  --   --  99  CO2 23  --  23 25  --   --  26  ANIONGAP 11  --  10 9  --   --  10  GLUCOSE 177*  --  139* 163*  --   --  109*  BUN 6*  --  7* 7*  --   --  9  CREATININE 0.75  --  0.69 0.71  --   --  0.76  CRP  --   --   --   --   --  3.7*  --   PROCALCITON  --   --   --   --  1.03  --   --   INR 1.3*  --   --   --   --   --   --   BNP 28.5  --   --   --  68.8  --   --   MG  --   --   --   --   --   --  2.1  CALCIUM  8.7*  --  8.5* 9.1  --   --  9.0    Total Time in preparing paper work, data evaluation and todays exam - 35 minutes  Signature  -    Lavada Stank M.D on 10/29/2023 at 8:08 AM   -  To page go to [email or web address]

## 2023-10-31 DIAGNOSIS — Z9981 Dependence on supplemental oxygen: Secondary | ICD-10-CM | POA: Diagnosis not present

## 2023-10-31 DIAGNOSIS — Z86711 Personal history of pulmonary embolism: Secondary | ICD-10-CM | POA: Diagnosis not present

## 2023-10-31 DIAGNOSIS — Z7951 Long term (current) use of inhaled steroids: Secondary | ICD-10-CM | POA: Diagnosis not present

## 2023-10-31 DIAGNOSIS — R7989 Other specified abnormal findings of blood chemistry: Secondary | ICD-10-CM | POA: Diagnosis not present

## 2023-10-31 DIAGNOSIS — M1990 Unspecified osteoarthritis, unspecified site: Secondary | ICD-10-CM | POA: Diagnosis not present

## 2023-10-31 DIAGNOSIS — Z7901 Long term (current) use of anticoagulants: Secondary | ICD-10-CM | POA: Diagnosis not present

## 2023-10-31 DIAGNOSIS — E1165 Type 2 diabetes mellitus with hyperglycemia: Secondary | ICD-10-CM | POA: Diagnosis not present

## 2023-10-31 DIAGNOSIS — J441 Chronic obstructive pulmonary disease with (acute) exacerbation: Secondary | ICD-10-CM | POA: Diagnosis not present

## 2023-10-31 DIAGNOSIS — E871 Hypo-osmolality and hyponatremia: Secondary | ICD-10-CM | POA: Diagnosis not present

## 2023-10-31 DIAGNOSIS — F250 Schizoaffective disorder, bipolar type: Secondary | ICD-10-CM | POA: Diagnosis not present

## 2023-10-31 DIAGNOSIS — E7800 Pure hypercholesterolemia, unspecified: Secondary | ICD-10-CM | POA: Diagnosis not present

## 2023-10-31 DIAGNOSIS — J9621 Acute and chronic respiratory failure with hypoxia: Secondary | ICD-10-CM | POA: Diagnosis not present

## 2023-10-31 DIAGNOSIS — J439 Emphysema, unspecified: Secondary | ICD-10-CM | POA: Diagnosis not present

## 2023-10-31 DIAGNOSIS — K2100 Gastro-esophageal reflux disease with esophagitis, without bleeding: Secondary | ICD-10-CM | POA: Diagnosis not present

## 2023-10-31 DIAGNOSIS — N400 Enlarged prostate without lower urinary tract symptoms: Secondary | ICD-10-CM | POA: Diagnosis not present

## 2023-10-31 DIAGNOSIS — C3431 Malignant neoplasm of lower lobe, right bronchus or lung: Secondary | ICD-10-CM | POA: Diagnosis not present

## 2023-10-31 DIAGNOSIS — Z7984 Long term (current) use of oral hypoglycemic drugs: Secondary | ICD-10-CM | POA: Diagnosis not present

## 2023-10-31 DIAGNOSIS — Z792 Long term (current) use of antibiotics: Secondary | ICD-10-CM | POA: Diagnosis not present

## 2023-10-31 DIAGNOSIS — F419 Anxiety disorder, unspecified: Secondary | ICD-10-CM | POA: Diagnosis not present

## 2023-10-31 DIAGNOSIS — S22000D Wedge compression fracture of unspecified thoracic vertebra, subsequent encounter for fracture with routine healing: Secondary | ICD-10-CM | POA: Diagnosis not present

## 2023-10-31 DIAGNOSIS — E559 Vitamin D deficiency, unspecified: Secondary | ICD-10-CM | POA: Diagnosis not present

## 2023-10-31 DIAGNOSIS — Z79899 Other long term (current) drug therapy: Secondary | ICD-10-CM | POA: Diagnosis not present

## 2023-10-31 DIAGNOSIS — I119 Hypertensive heart disease without heart failure: Secondary | ICD-10-CM | POA: Diagnosis not present

## 2023-11-04 ENCOUNTER — Encounter (HOSPITAL_COMMUNITY): Payer: Self-pay

## 2023-11-04 ENCOUNTER — Inpatient Hospital Stay (HOSPITAL_COMMUNITY)
Admission: EM | Admit: 2023-11-04 | Discharge: 2023-11-08 | DRG: 189 | Disposition: A | Discharge status: Home / Self Care | Attending: Internal Medicine | Admitting: Internal Medicine

## 2023-11-04 ENCOUNTER — Other Ambulatory Visit: Payer: Self-pay

## 2023-11-04 ENCOUNTER — Emergency Department (HOSPITAL_COMMUNITY)

## 2023-11-04 DIAGNOSIS — F209 Schizophrenia, unspecified: Secondary | ICD-10-CM | POA: Diagnosis not present

## 2023-11-04 DIAGNOSIS — Z1152 Encounter for screening for COVID-19: Secondary | ICD-10-CM

## 2023-11-04 DIAGNOSIS — J9622 Acute and chronic respiratory failure with hypercapnia: Secondary | ICD-10-CM | POA: Diagnosis present

## 2023-11-04 DIAGNOSIS — J441 Chronic obstructive pulmonary disease with (acute) exacerbation: Secondary | ICD-10-CM | POA: Diagnosis present

## 2023-11-04 DIAGNOSIS — E1169 Type 2 diabetes mellitus with other specified complication: Secondary | ICD-10-CM | POA: Diagnosis not present

## 2023-11-04 DIAGNOSIS — J9621 Acute and chronic respiratory failure with hypoxia: Secondary | ICD-10-CM | POA: Diagnosis present

## 2023-11-04 DIAGNOSIS — C3431 Malignant neoplasm of lower lobe, right bronchus or lung: Secondary | ICD-10-CM | POA: Diagnosis present

## 2023-11-04 DIAGNOSIS — E872 Acidosis, unspecified: Secondary | ICD-10-CM | POA: Insufficient documentation

## 2023-11-04 DIAGNOSIS — Z87891 Personal history of nicotine dependence: Secondary | ICD-10-CM

## 2023-11-04 DIAGNOSIS — N400 Enlarged prostate without lower urinary tract symptoms: Secondary | ICD-10-CM | POA: Diagnosis present

## 2023-11-04 DIAGNOSIS — Z8249 Family history of ischemic heart disease and other diseases of the circulatory system: Secondary | ICD-10-CM | POA: Diagnosis not present

## 2023-11-04 DIAGNOSIS — G9341 Metabolic encephalopathy: Secondary | ICD-10-CM | POA: Diagnosis not present

## 2023-11-04 DIAGNOSIS — Z923 Personal history of irradiation: Secondary | ICD-10-CM | POA: Diagnosis not present

## 2023-11-04 DIAGNOSIS — E119 Type 2 diabetes mellitus without complications: Secondary | ICD-10-CM | POA: Diagnosis not present

## 2023-11-04 DIAGNOSIS — R000 Tachycardia, unspecified: Secondary | ICD-10-CM | POA: Diagnosis present

## 2023-11-04 DIAGNOSIS — Z515 Encounter for palliative care: Secondary | ICD-10-CM

## 2023-11-04 DIAGNOSIS — R0602 Shortness of breath: Secondary | ICD-10-CM | POA: Diagnosis not present

## 2023-11-04 DIAGNOSIS — R456 Violent behavior: Secondary | ICD-10-CM | POA: Diagnosis not present

## 2023-11-04 DIAGNOSIS — F203 Undifferentiated schizophrenia: Secondary | ICD-10-CM

## 2023-11-04 DIAGNOSIS — E785 Hyperlipidemia, unspecified: Secondary | ICD-10-CM | POA: Diagnosis present

## 2023-11-04 DIAGNOSIS — F32A Depression, unspecified: Secondary | ICD-10-CM | POA: Diagnosis not present

## 2023-11-04 DIAGNOSIS — I10 Essential (primary) hypertension: Secondary | ICD-10-CM | POA: Diagnosis present

## 2023-11-04 DIAGNOSIS — R1310 Dysphagia, unspecified: Secondary | ICD-10-CM | POA: Diagnosis not present

## 2023-11-04 DIAGNOSIS — J439 Emphysema, unspecified: Secondary | ICD-10-CM | POA: Diagnosis not present

## 2023-11-04 DIAGNOSIS — J9600 Acute respiratory failure, unspecified whether with hypoxia or hypercapnia: Secondary | ICD-10-CM | POA: Diagnosis not present

## 2023-11-04 DIAGNOSIS — Z833 Family history of diabetes mellitus: Secondary | ICD-10-CM | POA: Diagnosis not present

## 2023-11-04 DIAGNOSIS — F419 Anxiety disorder, unspecified: Secondary | ICD-10-CM | POA: Diagnosis present

## 2023-11-04 DIAGNOSIS — E440 Moderate protein-calorie malnutrition: Secondary | ICD-10-CM | POA: Diagnosis not present

## 2023-11-04 DIAGNOSIS — Z7984 Long term (current) use of oral hypoglycemic drugs: Secondary | ICD-10-CM | POA: Diagnosis not present

## 2023-11-04 DIAGNOSIS — Z79899 Other long term (current) drug therapy: Secondary | ICD-10-CM

## 2023-11-04 DIAGNOSIS — Z6821 Body mass index (BMI) 21.0-21.9, adult: Secondary | ICD-10-CM

## 2023-11-04 DIAGNOSIS — R0603 Acute respiratory distress: Secondary | ICD-10-CM | POA: Diagnosis not present

## 2023-11-04 DIAGNOSIS — R0902 Hypoxemia: Secondary | ICD-10-CM | POA: Diagnosis not present

## 2023-11-04 DIAGNOSIS — Z86711 Personal history of pulmonary embolism: Secondary | ICD-10-CM

## 2023-11-04 DIAGNOSIS — Z9049 Acquired absence of other specified parts of digestive tract: Secondary | ICD-10-CM

## 2023-11-04 DIAGNOSIS — Z66 Do not resuscitate: Secondary | ICD-10-CM | POA: Diagnosis present

## 2023-11-04 DIAGNOSIS — Z85118 Personal history of other malignant neoplasm of bronchus and lung: Secondary | ICD-10-CM | POA: Diagnosis not present

## 2023-11-04 DIAGNOSIS — Z7901 Long term (current) use of anticoagulants: Secondary | ICD-10-CM

## 2023-11-04 DIAGNOSIS — Z823 Family history of stroke: Secondary | ICD-10-CM

## 2023-11-04 DIAGNOSIS — Z8701 Personal history of pneumonia (recurrent): Secondary | ICD-10-CM

## 2023-11-04 LAB — BASIC METABOLIC PANEL WITH GFR
Anion gap: 12 (ref 5–15)
BUN: 9 mg/dL (ref 8–23)
CO2: 27 mmol/L (ref 22–32)
Calcium: 8.4 mg/dL — ABNORMAL LOW (ref 8.9–10.3)
Chloride: 97 mmol/L — ABNORMAL LOW (ref 98–111)
Creatinine, Ser: 0.74 mg/dL (ref 0.61–1.24)
GFR, Estimated: >60 mL/min (ref >60)
Glucose, Bld: 165 mg/dL — ABNORMAL HIGH (ref 70–99)
Potassium: 4.3 mmol/L (ref 3.5–5.1)
Sodium: 136 mmol/L (ref 135–145)

## 2023-11-04 LAB — BRAIN NATRIURETIC PEPTIDE: B Natriuretic Peptide: 26.3 pg/mL (ref 0.0–100.0)

## 2023-11-04 LAB — TROPONIN I (HIGH SENSITIVITY)
Troponin I (High Sensitivity): 6 ng/L (ref <18)
Troponin I (High Sensitivity): 12 ng/L (ref <18)

## 2023-11-04 LAB — I-STAT VENOUS BLOOD GAS, ED
Acid-Base Excess: 7 mmol/L — ABNORMAL HIGH (ref 0.0–2.0)
Acid-base deficit: 2 mmol/L (ref 0.0–2.0)
Bicarbonate: 31.1 mmol/L — ABNORMAL HIGH (ref 20.0–28.0)
Bicarbonate: 24.7 mmol/L (ref 20.0–28.0)
Calcium, Ion: 1.05 mmol/L — ABNORMAL LOW (ref 1.15–1.40)
Calcium, Ion: 1 mmol/L — ABNORMAL LOW (ref 1.15–1.40)
HCT: 34 % — ABNORMAL LOW (ref 39.0–52.0)
HCT: 29 % — ABNORMAL LOW (ref 39.0–52.0)
Hemoglobin: 11.6 g/dL — ABNORMAL LOW (ref 13.0–17.0)
Hemoglobin: 9.9 g/dL — ABNORMAL LOW (ref 13.0–17.0)
O2 Saturation: 100 %
O2 Saturation: 73 %
Potassium: 4.8 mmol/L (ref 3.5–5.1)
Potassium: 3.8 mmol/L (ref 3.5–5.1)
Sodium: 133 mmol/L — ABNORMAL LOW (ref 135–145)
Sodium: 140 mmol/L (ref 135–145)
TCO2: 32 mmol/L (ref 22–32)
TCO2: 26 mmol/L (ref 22–32)
pCO2, Ven: 42.5 mmHg — ABNORMAL LOW (ref 44–60)
pCO2, Ven: 48 mmHg (ref 44–60)
pH, Ven: 7.473 — ABNORMAL HIGH (ref 7.25–7.43)
pH, Ven: 7.319 (ref 7.25–7.43)
pO2, Ven: 195 mmHg — ABNORMAL HIGH (ref 32–45)
pO2, Ven: 42 mmHg (ref 32–45)

## 2023-11-04 LAB — CBC WITH DIFFERENTIAL/PLATELET
Abs Immature Granulocytes: 0.07 K/uL (ref 0.00–0.07)
Basophils Absolute: 0.1 K/uL (ref 0.0–0.1)
Basophils Relative: 1 %
Eosinophils Absolute: 0.6 K/uL — ABNORMAL HIGH (ref 0.0–0.5)
Eosinophils Relative: 6 %
HCT: 37.9 % — ABNORMAL LOW (ref 39.0–52.0)
Hemoglobin: 12 g/dL — ABNORMAL LOW (ref 13.0–17.0)
Immature Granulocytes: 1 %
Lymphocytes Relative: 35 %
Lymphs Abs: 3.7 K/uL (ref 0.7–4.0)
MCH: 26.9 pg (ref 26.0–34.0)
MCHC: 31.7 g/dL (ref 30.0–36.0)
MCV: 85 fL (ref 80.0–100.0)
Monocytes Absolute: 1.6 K/uL — ABNORMAL HIGH (ref 0.1–1.0)
Monocytes Relative: 16 %
Neutro Abs: 4.4 K/uL (ref 1.7–7.7)
Neutrophils Relative %: 41 %
Platelets: 390 K/uL (ref 150–400)
RBC: 4.46 MIL/uL (ref 4.22–5.81)
RDW: 15.4 % (ref 11.5–15.5)
WBC: 10.5 K/uL (ref 4.0–10.5)
nRBC: 0 % (ref 0.0–0.2)

## 2023-11-04 LAB — PROCALCITONIN: Procalcitonin: <0.1 ng/mL

## 2023-11-04 LAB — I-STAT ARTERIAL BLOOD GAS, ED
Acid-Base Excess: 1 mmol/L (ref 0.0–2.0)
Bicarbonate: 29.7 mmol/L — ABNORMAL HIGH (ref 20.0–28.0)
Calcium, Ion: 1.21 mmol/L (ref 1.15–1.40)
HCT: 35 % — ABNORMAL LOW (ref 39.0–52.0)
Hemoglobin: 11.9 g/dL — ABNORMAL LOW (ref 13.0–17.0)
O2 Saturation: 100 %
Patient temperature: 96.4
Potassium: 4.1 mmol/L (ref 3.5–5.1)
Sodium: 134 mmol/L — ABNORMAL LOW (ref 135–145)
TCO2: 32 mmol/L (ref 22–32)
pCO2 arterial: 62.6 mmHg — ABNORMAL HIGH (ref 32–48)
pH, Arterial: 7.278 — ABNORMAL LOW (ref 7.35–7.45)
pO2, Arterial: 387 mmHg — ABNORMAL HIGH (ref 83–108)

## 2023-11-04 LAB — I-STAT CG4 LACTIC ACID, ED
Lactic Acid, Venous: 2.6 mmol/L (ref 0.5–1.9)
Lactic Acid, Venous: 1.1 mmol/L (ref 0.5–1.9)

## 2023-11-04 LAB — RESP PANEL BY RT-PCR (RSV, FLU A&B, COVID)  RVPGX2
Influenza A by PCR: NEGATIVE
Influenza B by PCR: NEGATIVE
Resp Syncytial Virus by PCR: NEGATIVE
SARS Coronavirus 2 by RT PCR: NEGATIVE

## 2023-11-04 LAB — HIV ANTIBODY (ROUTINE TESTING W REFLEX): HIV Screen 4th Generation wRfx: NONREACTIVE

## 2023-11-04 MED ORDER — ACETAMINOPHEN 325 MG PO TABS: 650.0000 mg | ORAL_TABLET | Freq: Four times a day (QID) | ORAL | Status: DC | PRN

## 2023-11-04 MED ORDER — SODIUM CHLORIDE 0.9 % IV SOLN: INTRAVENOUS | Status: DC

## 2023-11-04 MED ORDER — METOPROLOL TARTRATE 5 MG/5ML IV SOLN: 5.0000 mg | INTRAVENOUS | Status: DC | PRN

## 2023-11-04 MED ORDER — DEXTROSE IN LACTATED RINGERS 5 % IV SOLN: INTRAVENOUS | Status: DC

## 2023-11-04 MED ORDER — TAMSULOSIN HCL 0.4 MG PO CAPS: 0.4000 mg | ORAL_CAPSULE | Freq: Every day | ORAL | Status: DC

## 2023-11-04 MED ORDER — FAMOTIDINE 20 MG PO TABS: 40.0000 mg | ORAL_TABLET | Freq: Two times a day (BID) | ORAL | Status: DC

## 2023-11-04 MED ORDER — RISPERIDONE 3 MG PO TABS
3.0000 mg | ORAL_TABLET | Freq: Two times a day (BID) | ORAL | Status: DC
  Filled 2023-11-04 (×2): qty 1

## 2023-11-04 MED ORDER — SODIUM CHLORIDE 0.9 % IV SOLN
1.0000 g | INTRAVENOUS | Status: DC
  Administered 2023-11-04: 1 g via INTRAVENOUS
  Filled 2023-11-04: qty 10

## 2023-11-04 MED ORDER — APIXABAN 5 MG PO TABS
5.0000 mg | ORAL_TABLET | Freq: Two times a day (BID) | ORAL | Status: DC
  Filled 2023-11-04: qty 1

## 2023-11-04 MED ORDER — ALBUTEROL SULFATE (2.5 MG/3ML) 0.083% IN NEBU
10.0000 mg/h | INHALATION_SOLUTION | Freq: Once | RESPIRATORY_TRACT | Status: AC
  Administered 2023-11-04: 10 mg/h via RESPIRATORY_TRACT
  Filled 2023-11-04: qty 12

## 2023-11-04 MED ORDER — ENOXAPARIN SODIUM 60 MG/0.6ML IJ SOSY
1.0000 mg/kg | PREFILLED_SYRINGE | Freq: Two times a day (BID) | INTRAMUSCULAR | Status: DC
  Administered 2023-11-04 – 2023-11-06 (×4): 60 mg via SUBCUTANEOUS
  Filled 2023-11-04 (×4): qty 0.6

## 2023-11-04 MED ORDER — ACETAMINOPHEN 650 MG RE SUPP: 650.0000 mg | Freq: Four times a day (QID) | RECTAL | Status: DC | PRN

## 2023-11-04 MED ORDER — CARVEDILOL 6.25 MG PO TABS
6.2500 mg | ORAL_TABLET | Freq: Two times a day (BID) | ORAL | Status: DC
  Filled 2023-11-04: qty 1

## 2023-11-04 MED ORDER — PANTOPRAZOLE SODIUM 40 MG IV SOLR
40.0000 mg | INTRAVENOUS | Status: DC
  Administered 2023-11-04 – 2023-11-05 (×2): 40 mg via INTRAVENOUS
  Filled 2023-11-04 (×2): qty 10

## 2023-11-04 MED ORDER — MIRTAZAPINE 15 MG PO TABS: 30.0000 mg | ORAL_TABLET | Freq: Every evening | ORAL | Status: DC | PRN

## 2023-11-04 MED ORDER — HALOPERIDOL LACTATE 5 MG/ML IJ SOLN: 1.0000 mg | Freq: Four times a day (QID) | INTRAMUSCULAR | Status: DC | PRN

## 2023-11-04 MED ORDER — LORAZEPAM 2 MG/ML IJ SOLN
0.5000 mg | Freq: Once | INTRAMUSCULAR | Status: AC
  Administered 2023-11-04: 0.5 mg via INTRAVENOUS
  Filled 2023-11-04: qty 1

## 2023-11-04 MED ORDER — SODIUM CHLORIDE 0.9% FLUSH
3.0000 mL | Freq: Two times a day (BID) | INTRAVENOUS | Status: DC
  Administered 2023-11-04 – 2023-11-08 (×8): 3 mL via INTRAVENOUS

## 2023-11-04 MED ORDER — BENZTROPINE MESYLATE 2 MG PO TABS
2.0000 mg | ORAL_TABLET | Freq: Two times a day (BID) | ORAL | Status: DC
  Filled 2023-11-04 (×2): qty 1

## 2023-11-04 MED ORDER — TRAMADOL HCL 50 MG PO TABS: 50.0000 mg | ORAL_TABLET | Freq: Four times a day (QID) | ORAL | Status: DC | PRN

## 2023-11-04 MED ORDER — ATORVASTATIN CALCIUM 10 MG PO TABS: 10.0000 mg | ORAL_TABLET | Freq: Every day | ORAL | Status: DC

## 2023-11-04 MED ORDER — ALBUTEROL SULFATE (2.5 MG/3ML) 0.083% IN NEBU: 2.5000 mg | INHALATION_SOLUTION | RESPIRATORY_TRACT | Status: DC | PRN

## 2023-11-04 MED ORDER — IPRATROPIUM-ALBUTEROL 0.5-2.5 (3) MG/3ML IN SOLN
3.0000 mL | RESPIRATORY_TRACT | Status: DC
  Administered 2023-11-04 – 2023-11-05 (×4): 3 mL via RESPIRATORY_TRACT
  Filled 2023-11-04 (×5): qty 3

## 2023-11-04 MED ORDER — METHYLPREDNISOLONE SODIUM SUCC 40 MG IJ SOLR
40.0000 mg | Freq: Two times a day (BID) | INTRAMUSCULAR | Status: DC
  Administered 2023-11-04 – 2023-11-06 (×4): 40 mg via INTRAVENOUS
  Filled 2023-11-04 (×4): qty 1

## 2023-11-04 MED ORDER — IPRATROPIUM-ALBUTEROL 0.5-2.5 (3) MG/3ML IN SOLN
3.0000 mL | Freq: Four times a day (QID) | RESPIRATORY_TRACT | Status: DC
  Administered 2023-11-04: 3 mL via RESPIRATORY_TRACT
  Filled 2023-11-04: qty 3

## 2023-11-04 NOTE — Progress Notes (Signed)
 Visited with pt. And provided emotional and spiritual support.  Will follow as needed.   Rayleen Dade, [place], [facility] Pager [phone]

## 2023-11-04 NOTE — Plan of Care (Addendum)
 Apparently patient is n.p.o. due to aspiration risk and patient has been choking every time trying to give any oral medications.  Holding all oral medications.  Given patient has history of PE he is on Eliquis  twice daily. As antipsychotic medication cannot be changed from oral to IV so I have to discontinue it.  Patient has history of pulmonary embolism recently diagnosed in August 2025 currently on Eliquis .  Given unable to give oral Eliquis  at this moment starting IV Lovenox  60 mg twice daily (1 mg/kg) twice daily for the management of chronic PE.

## 2023-11-04 NOTE — Evaluation (Signed)
 Physical Therapy Evaluation Patient Details Name: Blake Mcknight MRN: [id number] DOB: [birth year] Today's Date: 11/04/2023  History of Present Illness  68 y.o. male presents to [facility] hospital on 11/04/2023 with SOB and hypoxia. PMH includes COPD (2L O2 baseline), lung CA s/p SBRT, HTN, HLD, schizophrenia, DM II.  Clinical Impression  Pt presents to PT with deficits in functional mobility, gait, balance, endurance, cardiopulmonary function. Pt is nonverbal for most of the evaluation with history provided by the patient's sister. Pt requires frequent verbal cues for participation in mobility as well as assistance for safety due to seemingly reduced awareness of deficits. Pt shows signs of increased work of breathing during ambulation, SpO2 found to be at 92% while on 2L taken immediately once seated on bed. PT will continue to follow in an effort to improve activity tolerance and cardiopulmonary function, as well as gait and balance quality. HHPT recommended at the time of discharge.        If plan is discharge home, recommend the following: A little help with walking and/or transfers;A little help with bathing/dressing/bathroom;Assistance with cooking/housework;Direct supervision/assist for medications management;Direct supervision/assist for financial management;Assist for transportation;Help with stairs or ramp for entrance;Supervision due to cognitive status   Can travel by private vehicle        Equipment Recommendations None recommended by PT  Recommendations for Other Services       Functional Status Assessment Patient has had a recent decline in their functional status and demonstrates the ability to make significant improvements in function in a reasonable and predictable amount of time.     Precautions / Restrictions Precautions Precautions: Fall Recall of Precautions/Restrictions: Impaired Precaution/Restrictions Comments: pt is largely non-verbal for session, inconsistently  follows commands Restrictions Weight Bearing Restrictions Per Provider Order: No      Mobility  Bed Mobility Overal bed mobility: Needs Assistance Bed Mobility: Supine to Sit, Sit to Supine     Supine to sit: Min assist Sit to supine: Contact guard assist        Transfers Overall transfer level: Needs assistance Equipment used: None Transfers: Sit to/from Stand Sit to Stand: Contact guard assist                Ambulation/Gait Ambulation/Gait assistance: Contact guard assist Gait Distance (Feet): 30 Feet Assistive device: None Gait Pattern/deviations: Step-to pattern Gait velocity: reduced Gait velocity interpretation: <1.8 ft/sec, indicate of risk for recurrent falls   General Gait Details: slowed step-to gait, mild increase in lateral sway, distance limited due to increased work of breathing  Stairs            Wheelchair Mobility     Tilt Bed    Modified Rankin (Stroke Patients Only)       Balance Overall balance assessment: Needs assistance Sitting-balance support: No upper extremity supported, Feet supported Sitting balance-Leahy Scale: Fair     Standing balance support: No upper extremity supported, During functional activity Standing balance-Leahy Scale: Fair                               Pertinent Vitals/Pain Pain Assessment Pain Assessment: No/denies pain    Home Living Family/patient expects to be discharged to:: Private residence Living Arrangements: Other relatives (sister) Available Help at Discharge: Family;Available 24 hours/day Type of Home: House Home Access: Stairs to enter Entrance Stairs-Rails: None Entrance Stairs-Number of Steps: 2   Home Layout: One level Home Equipment: Wheelchair - Biomedical scientist (2 wheels)  Prior Function Prior Level of Function : Needs assist             Mobility Comments: indep without DME; limited household ambulator secondary to baseline SOB.  Sister reports recent instability in last 2 weeks ADLs Comments: uses shower chair for bathing, reports mod indep with this and ADLs. sister assists with iADLs, including cooking, cleaning, med management, driving     Extremity/Trunk Assessment   Upper Extremity Assessment Upper Extremity Assessment: Generalized weakness    Lower Extremity Assessment Lower Extremity Assessment: Generalized weakness    Cervical / Trunk Assessment Cervical / Trunk Assessment: Normal  Communication   Communication Communication: Impaired Factors Affecting Communication: Difficulty expressing self    Cognition Arousal: Alert Behavior During Therapy: Flat affect   PT - Cognitive impairments: History of cognitive impairments                       PT - Cognition Comments: h/o schizophrenia. requires some repetition with commands. Largely non-verbal but does say thank you at end of session (atypical from baseline) Following commands: Impaired Following commands impaired: Follows one step commands with increased time     Cueing Cueing Techniques: Verbal cues, Tactile cues, Visual cues     General Comments General comments (skin integrity, edema, etc.): pt on 2L [place] upon PT arrival, sats at 95% when sitting. PT notes increased work of breathing during ambulation, SpO2 checked quickly after return to sitting and at 92% still on 2L [place]    Exercises     Assessment/Plan    PT Assessment Patient needs continued PT services  PT Problem List Decreased strength;Decreased activity tolerance;Decreased balance;Decreased mobility;Decreased knowledge of use of DME;Decreased safety awareness;Decreased knowledge of precautions;Cardiopulmonary status limiting activity       PT Treatment Interventions DME instruction;Gait training;Stair training;Functional mobility training;Therapeutic activities;Therapeutic exercise;Balance training;Neuromuscular re-education;Cognitive remediation;Patient/family  education    PT Goals (Current goals can be found in the Care Plan section)  Acute Rehab PT Goals Patient Stated Goal: to return to baseline, mobilize without assistance within the home PT Goal Formulation: With family Time For Goal Achievement: 11/18/23 Potential to Achieve Goals: Fair    Frequency Min 2X/week     Co-evaluation               AM-PAC PT 6 Clicks Mobility  Outcome Measure Help needed turning from your back to your side while in a flat bed without using bedrails?: A Little Help needed moving from lying on your back to sitting on the side of a flat bed without using bedrails?: A Little Help needed moving to and from a bed to a chair (including a wheelchair)?: A Little Help needed standing up from a chair using your arms (e.g., wheelchair or bedside chair)?: A Little Help needed to walk in hospital room?: A Little Help needed climbing 3-5 steps with a railing? : A Lot 6 Click Score: 17    End of Session Equipment Utilized During Treatment: Oxygen ;Gait belt Activity Tolerance: Patient limited by fatigue Patient left: in bed;with call bell/phone within reach;with bed alarm set;with family/visitor present Nurse Communication: Mobility status PT Visit Diagnosis: Other abnormalities of gait and mobility (R26.89);Muscle weakness (generalized) (M62.81);Unsteadiness on feet (R26.81)    Time: 8390-8364 PT Time Calculation (min) (ACUTE ONLY): 26 min   Charges:   PT Evaluation $PT Eval Low Complexity: 1 Low   PT General Charges $$ ACUTE PT VISIT: 1 Visit         Bernardino JINNY Ruth,  PT, DPT Acute Rehabilitation Office [phone]   Bernardino JINNY Ruth 11/04/2023, 4:58 PM

## 2023-11-04 NOTE — ED Provider Notes (Signed)
 Pt signed out by Dr. Bari pending symptomatic improvement.  He was just d/c on 8/14 for a COPD exacerbation.  He's doing well on bipap.  Pt is somnolent, but did receive ativan  and did not sleep last night.  Sister does not want him intubated.   Pt d/w Dr. FABIENE Sharps (triad) for admission.   Dean Clarity, MD 11/04/23 [phone]

## 2023-11-04 NOTE — ED Triage Notes (Signed)
 Pt bibgcems form home ems called for c/o sob. Pt at 60% on RAA upon ems arrival. Cpap with ems, duoneb, 0.3 epi, 125mg  solumedrol, 2g magnesium  given with ems.   90% CPAP  Hr 130-140

## 2023-11-04 NOTE — Progress Notes (Signed)
 Taken off BIPAP per MD request & placed on 3L [place]. Patient is toleting well. BIPAP is at the bedside on standby if needed. RN aware.

## 2023-11-04 NOTE — ED Notes (Signed)
 When evaluation swallowing before attempting PO meds PT gargled and coughed not tolerating thin liquids. Admitting made aware.

## 2023-11-04 NOTE — H&P (Addendum)
 History and Physical    Patient: Blake Mcknight FMW:[id number] DOB: 01/25/1956 DOA: 11/04/2023 DOS: the patient was seen and examined on 11/04/2023 PCP: [name], MD  Patient coming from: Home  Chief Complaint:  Chief Complaint  Patient presents with   Shortness of Breath   HPI: [name] is a 68 y.o. male with medical history significant of COPD, lung cancer, and chronic hypoxic respiratory failure hypertension, hyperlipidemia, history of PE on Eliquis , and schizophrenia presents with shortness of breath with increased lethargy.  History is obtained from patient's sister present at bedside.  Patient just recently hospitalized 8/12-8/14 for acute on chronic respiratory failure with hypoxia secondary to COPD exacerbation with the possibility of pneumonia.  He was discharged from the hospital about a week ago and was doing well until yesterday when he began experiencing confusion and increased sleepiness. His sister notes that he was not confused prior to the recent hospital admission and was able to manage his medications independently.  He uses a CPAP machine at home, which seemed to help during his hospital stay, but his caregiver is unsure if he is using it consistently at night.  He has a history of pneumonia and blood clots from previous hospital visits. His caregiver reports that he has not had a bowel movement in about three days and has been eating minimally, with oatmeal being one of the few foods he has consumed recently.  He has been on Trelegy, but his caregiver stopped administering it due to concerns it might be causing confusion. He has difficulty swallowing, as noted when he retained a pill in his mouth overnight.  His caregiver reports wheezing and a history of recurrent pneumonia. There is concern about his ability to cough and clear secretions effectively. He has not had a bowel movement in three days. He has been eating minimally and has difficulty  swallowing, as evidenced by retaining a pill in his mouth overnight.  Upon admission to the emergency department patient was noted to have temperature of 96.4 F with heart rates elevated up to 132, respirations 39, blood pressures maintained, and O2 saturation currently maintained in the patient on BiPAP..  Labs noted WBC 10.5, hemoglobin 11.9, sodium 134, chloride 97, calcium  8.4, BNP 126, and lactic acid 2.5.  ABG noted pH to be 7.278, PCO2 62.6, PO2 387.  Patient chest x-ray noted extensive bullous emphysema with irregular reticular scarring, similar to the prior exam.  Patient had been given Ativan  0.5 mg IV and continuous albuterol  treatment.  Review of Systems: unable to review all systems due to the inability of the patient to answer questions. Past Medical History:  Diagnosis Date   Abdominal pain 06/03/2013   Chest pain 08/10/2023   COPD (chronic obstructive pulmonary disease) (HCC)    with bullous emphysema.    COPD exacerbation (HCC) 08/09/2023   Heavy cigarette smoker before 2003   pt claims only 10 cigs per day, never heavier amounts.    HLD (hyperlipidemia)    Hypertension    Schizophrenia (HCC) 06/28/2013   This is a chronic condition and he lives with family   Past Surgical History:  Procedure Laterality Date   CHOLECYSTECTOMY N/A 06/06/2013   Procedure: LAPAROSCOPIC CHOLECYSTECTOMY WITH ATTEMPTED INTRAOPERATIVE CHOLANGIOGRAM;  Surgeon: Dann FORBES Hummer, MD;  Location: [facility] OR;  Service: General;  Laterality: N/A;   ERCP N/A 06/03/2013   Procedure: ENDOSCOPIC RETROGRADE CHOLANGIOPANCREATOGRAPHY (ERCP);  Surgeon: Gwendlyn ONEIDA Buddy, MD;  Location: [facility] ENDOSCOPY;  Service: Endoscopy;  Laterality: N/A;  Social History:  reports that he has quit smoking. His smoking use included cigarettes. He has a 41 pack-year smoking history. He has never used smokeless tobacco. He reports that he does not drink alcohol and does not use drugs.  No Known Allergies  Family History  Problem  Relation Age of Onset   Diabetes Mellitus II Mother    Diabetes Mother    Heart disease Father    Stroke Maternal Aunt    CAD Neg Hx     Prior to Admission medications   Medication Sig Start Date End Date Taking? Authorizing Provider  acetaminophen  (TYLENOL ) 500 MG tablet Take 2 tablets (1,000 mg total) by mouth every 8 (eight) hours. Patient taking differently: Take 1,000 mg by mouth every 8 (eight) hours as needed for mild pain (pain score 1-3) or headache. 08/14/23   Fausto Sor A, DO  albuterol  (PROVENTIL ) (2.5 MG/3ML) 0.083% nebulizer solution Take 3 mLs (2.5 mg total) by nebulization every 4 (four) hours as needed for wheezing or shortness of breath. 08/13/23   Fausto Sor A, DO  albuterol  (VENTOLIN  HFA) 108 (90 Base) MCG/ACT inhaler Inhale 2 puffs into the lungs every 4 (four) hours as needed for wheezing or shortness of breath. 10/02/22   Bernard Drivers, MD  apixaban  (ELIQUIS ) 5 MG TABS tablet Take 1 tablet (5 mg total) by mouth 2 (two) times daily. 10/22/23   Shelah Lamar RAMAN, MD  arformoterol  (BROVANA ) 15 MCG/2ML NEBU Take 2 mLs (15 mcg total) by nebulization 2 (two) times daily. 09/10/23   Will Almarie MATSU, MD  atorvastatin  (LIPITOR) 10 MG tablet Take 1 tablet (10 mg total) by mouth at bedtime. 08/14/23   Fausto Sor LABOR, DO  benztropine  (COGENTIN ) 2 MG tablet Take 1 tablet (2 mg total) by mouth 2 (two) times daily. 07/30/23   Arfeen, Leni DASEN, MD  carvedilol  (COREG ) 3.125 MG tablet Take 2 tablets (6.25 mg total) by mouth 2 (two) times daily with a meal. 09/10/23   Will Almarie MATSU, MD  cetirizine (ZYRTEC) 10 MG tablet Take 10 mg by mouth daily as needed for allergies or rhinitis.    [provider]  cholecalciferol (VITAMIN D) 25 MCG (1000 UNIT) tablet Take 5,000 Units by mouth every morning.    [provider]  cyanocobalamin  1000 MCG tablet Take 1,000 mcg by mouth daily.    [provider]  doxycycline  (VIBRA -TABS) 100 MG tablet Take 1 tablet (100  mg total) by mouth every 12 (twelve) hours. 10/29/23   Singh, Prashant K, MD  famotidine  (PEPCID ) 40 MG tablet Take 40 mg by mouth 2 (two) times daily. 12/28/18   [provider]  feeding supplement, GLUCERNA SHAKE, (GLUCERNA SHAKE) LIQD Take 237 mLs by mouth 3 (three) times daily between meals. 08/14/23   Fausto Sor LABOR, DO  hydrOXYzine  (ATARAX ) 25 MG tablet Take 1 tablet (25 mg total) by mouth 3 (three) times daily as needed for anxiety. 10/12/23   Sherrill Cable Latif, DO  ipratropium-albuterol  (DUONEB) 0.5-2.5 (3) MG/3ML SOLN Take 3 mLs by nebulization every 4 (four) hours. Patient taking differently: Take 3 mLs by nebulization every 4 (four) hours as needed. 08/14/23   Fausto Sor A, DO  JANUVIA 100 MG tablet Take 100 mg by mouth every evening. 01/30/23   [provider]  lidocaine  (LIDODERM ) 5 % Place 1 patch onto the skin daily. Remove & Discard patch within 12 hours or as directed by MD 09/10/23   Will Almarie MATSU, MD  methocarbamol  (ROBAXIN ) 750 MG  tablet Take 1 tablet (750 mg total) by mouth every 8 (eight) hours as needed for muscle spasms. 09/10/23   Will Almarie MATSU, MD  methylPREDNISolone  (MEDROL  DOSEPAK) 4 MG TBPK tablet follow package directions 10/29/23   Singh, Prashant K, MD  mirtazapine  (REMERON ) 30 MG tablet Take 1 tablet (30 mg total) by mouth at bedtime as needed (insomnia). 07/30/23 07/29/24  Arfeen, Leni DASEN, MD  multivitamin (ONE-A-DAY MEN'S) TABS tablet Take 1 tablet by mouth daily.    [provider]  OXYGEN  Inhale 2 L/min into the lungs continuous.     [provider]  polyethylene glycol (MIRALAX  / GLYCOLAX ) 17 g packet Take 17 g by mouth daily. 09/10/23   Will Almarie MATSU, MD  risperiDONE  (RISPERDAL ) 3 MG tablet Take 1 tablet (3 mg total) by mouth 2 (two) times daily. 07/30/23   Arfeen, Leni DASEN, MD  sodium chloride  (OCEAN) 0.65 % SOLN nasal spray Place 1 spray into both nostrils as needed for congestion.    [provider]   tamsulosin  (FLOMAX ) 0.4 MG CAPS capsule Take 0.4 mg by mouth in the morning and at bedtime. 07/23/20   [provider]  traMADol  (ULTRAM ) 50 MG tablet Take 1 tablet (50 mg total) by mouth every 6 (six) hours as needed. 10/21/23   Shelah Lamar RAMAN, MD  TRELEGY ELLIPTA  100-62.5-25 MCG/ACT AEPB Inhale 1 puff into the lungs daily. 08/29/23   [provider]    Physical Exam: Vitals:   11/04/23 0645 11/04/23 0712 11/04/23 0715 11/04/23 0730  BP: 114/79 109/72 94/76 105/73  Pulse: (!) 103 (!) 102 (!) 101 96  Resp: (!) 34 18 19 14   Temp:      TempSrc:      SpO2: 99% 99% 99% 100%    Constitutional: Elderly male who appears chronically ill Eyes: PERRL, lids and conjunctivae normal ENMT: Mucous membranes are moist.  Normal dentition.  Neck: normal, supple  Respiratory: Expiratory wheezes appreciated in both lung fields currently on 3 L of nasal cannula oxygen  with O2 saturations maintained. Cardiovascular: Regular rate and rhythm, no murmurs / rubs / gallops.  2+ pedal pulses. No carotid bruits.  Abdomen: no tenderness, no masses palpated. Bowel sounds positive.  Musculoskeletal: no clubbing / cyanosis. No joint deformity upper and lower extremities. Good ROM, no contractures. Normal muscle tone.  Skin: no rashes, lesions, ulcers. No induration Neurologic: CN 2-12 grossly intact.  Global weakness noted. Psychiatric: Lethargic not able to answer questions at this time.  Data Reviewed:  reviewed labs, imaging, and pertinent records as documented.  Assessment and Plan:  Acute on chronic respiratory failure with hypoxia and hypercapnia Acute on chronic COPD exacerbation Patient presented with worsening shortness of breath.  ABG revealed pH of 7.278, PCO2 62.6, PO2 387.  Patient was placed on BiPAP due to his symptoms.  Chest x-ray showing extensive bullous emphysema with irregular reticular scarring similar to prior imaging studies.  Patient had been - Admit to a progressive  bed - Continuous pulse oximetry with oxygen  to maintain O2 saturation greater than 92% - BiPAP as needed - Incentive spirometry and flutter valve - Check procalcitonin and screening respiratory virus panel - Scheduled DuoNeb breathing treatments and as needed - Rocephin  IV - Solu-Medrol  IV twice daily  Acute metabolic encephalopathy Patient noted to be acutely altered by sister and more lethargic.  Suspect secondary to hypercapnia. - Continue to monitor  Lactic acidosis Acute.  Lactic acid elevated at 2.6 on admission, but repeat check within normal limits. -  Check CRP and procalcitonin  Lung cancer Patient with history of lung cancer s/p XRT.  Patient being followed by pulmonology with plan for upcoming PET scan. - Continue outpatient follow-up with pulmonology and reschedule PET scan  Dysphagia Patient noted to have some difficulty with swallowing while in the ED after being taken off of BiPAP. - Speech therapy consulted to evaluate  History of pulmonary embolism Patient had been found to have a pulmonary embolism on CTA of the chest from 09/30/23 - Continue Eliquis   Schizophrenia, depression, anxiety - Continue Risperdal , Cogentin , Remeron , and as-needed hydroxyzine    DVT prophylaxis: Eliquis  Advance Care Planning:   Code Status: Limited: Do not attempt resuscitation (DNR) -DNR-LIMITED -Do Not Intubate/DNI    Consults: None  Family Communication: Patient's sister updated at bedside.  Severity of Illness: The appropriate patient status for this patient is INPATIENT. Inpatient status is judged to be reasonable and necessary in order to provide the required intensity of service to ensure the patient's safety. The patient's presenting symptoms, physical exam findings, and initial radiographic and laboratory data in the context of their chronic comorbidities is felt to place them at high risk for further clinical deterioration. Furthermore, it is not anticipated that the patient  will be medically stable for discharge from the hospital within 2 midnights of admission.   * I certify that at the point of admission it is my clinical judgment that the patient will require inpatient hospital care spanning beyond 2 midnights from the point of admission due to high intensity of service, high risk for further deterioration and high frequency of surveillance required.*  Author: Maximino DELENA Sharps, MD 11/04/2023 8:30 AM  For on call review [email or web address].

## 2023-11-04 NOTE — ED Notes (Signed)
 Verbal order from admitting to wean pt off BiPAP, RT called. Pt tolerating well, family at bedside.

## 2023-11-04 NOTE — Progress Notes (Signed)
   11/04/23 1937  Provider Notification  Provider Name/Title Dr. Lee  Date Provider Notified 11/04/23  Time Provider Notified 1950  Method of Notification Page  Notification Reason Notified Dr. Sundil to please change medications order to IV form for this Pt. We keep him NPO due to high risk of aspiration. Awaiting for SLP evaluation, possibly tomorrow. He has coughing and chocking every time when he drinks or eats.  Provider response Evaluate remotely;See new orders  Date of Provider Response 11/04/23  Time of Provider Response 1952    Wendi Dash, RN

## 2023-11-04 NOTE — ED Provider Notes (Signed)
 [place] EMERGENCY DEPARTMENT AT [facility] Provider Note   CSN: [id number] Arrival date & time: 11/04/23  0602     Patient presents with: Shortness of Breath   Blake Mcknight is a 68 y.o. male.   HPI     This is a 68 year old male with history of severe COPD who presents with shortness of breath.  EMS reports they got called out for shortness of breath.  Patient indicated acute onset shortness of breath.  Was noted to be hypoxic in the 60s.  Was immediately placed on CPAP.  He was given Solu-Medrol , epinephrine , magnesium , and a DuoNeb.  Patient does not contribute much to history taking.   Level 5 caveat. Prior to Admission medications   Medication Sig Start Date End Date Taking? Authorizing Provider  acetaminophen  (TYLENOL ) 500 MG tablet Take 2 tablets (1,000 mg total) by mouth every 8 (eight) hours. Patient taking differently: Take 1,000 mg by mouth every 8 (eight) hours as needed for mild pain (pain score 1-3) or headache. 08/14/23   Fausto Burnard LABOR, DO  albuterol  (PROVENTIL ) (2.5 MG/3ML) 0.083% nebulizer solution Take 3 mLs (2.5 mg total) by nebulization every 4 (four) hours as needed for wheezing or shortness of breath. 08/13/23   Fausto Burnard LABOR, DO  albuterol  (VENTOLIN  HFA) 108 (90 Base) MCG/ACT inhaler Inhale 2 puffs into the lungs every 4 (four) hours as needed for wheezing or shortness of breath. 10/02/22   Bernard Drivers, MD  apixaban  (ELIQUIS ) 5 MG TABS tablet Take 1 tablet (5 mg total) by mouth 2 (two) times daily. 10/22/23   Shelah Lamar RAMAN, MD  arformoterol  (BROVANA ) 15 MCG/2ML NEBU Take 2 mLs (15 mcg total) by nebulization 2 (two) times daily. 09/10/23   Will Almarie MATSU, MD  atorvastatin  (LIPITOR) 10 MG tablet Take 1 tablet (10 mg total) by mouth at bedtime. 08/14/23   Fausto Burnard A, DO  benztropine  (COGENTIN ) 2 MG tablet Take 1 tablet (2 mg total) by mouth 2 (two) times daily. 07/30/23   Arfeen, Leni DASEN, MD  carvedilol  (COREG ) 3.125 MG tablet  Take 2 tablets (6.25 mg total) by mouth 2 (two) times daily with a meal. 09/10/23   Will Almarie MATSU, MD  cetirizine (ZYRTEC) 10 MG tablet Take 10 mg by mouth daily as needed for allergies or rhinitis.    [provider]  cholecalciferol (VITAMIN D) 25 MCG (1000 UNIT) tablet Take 5,000 Units by mouth every morning.    [provider]  cyanocobalamin  1000 MCG tablet Take 1,000 mcg by mouth daily.    [provider]  doxycycline  (VIBRA -TABS) 100 MG tablet Take 1 tablet (100 mg total) by mouth every 12 (twelve) hours. 10/29/23   Dennise Lavada POUR, MD  famotidine  (PEPCID ) 40 MG tablet Take 40 mg by mouth 2 (two) times daily. 12/28/18   [provider]  feeding supplement, GLUCERNA SHAKE, (GLUCERNA SHAKE) LIQD Take 237 mLs by mouth 3 (three) times daily between meals. 08/14/23   Fausto Burnard LABOR, DO  hydrOXYzine  (ATARAX ) 25 MG tablet Take 1 tablet (25 mg total) by mouth 3 (three) times daily as needed for anxiety. 10/12/23   Sherrill Cable Latif, DO  ipratropium-albuterol  (DUONEB) 0.5-2.5 (3) MG/3ML SOLN Take 3 mLs by nebulization every 4 (four) hours. Patient taking differently: Take 3 mLs by nebulization every 4 (four) hours as needed. 08/14/23   Fausto Burnard A, DO  JANUVIA 100 MG tablet Take 100 mg by mouth every evening. 01/30/23   [provider]  lidocaine  (LIDODERM ) 5 % Place 1 patch onto the skin daily. Remove & Discard patch within 12 hours or as directed by MD 09/10/23   Will Almarie MATSU, MD  methocarbamol  (ROBAXIN ) 750 MG tablet Take 1 tablet (750 mg total) by mouth every 8 (eight) hours as needed for muscle spasms. 09/10/23   Will Almarie MATSU, MD  methylPREDNISolone  (MEDROL  DOSEPAK) 4 MG TBPK tablet follow package directions 10/29/23   Singh, Prashant K, MD  mirtazapine  (REMERON ) 30 MG tablet Take 1 tablet (30 mg total) by mouth at bedtime as needed (insomnia). 07/30/23 07/29/24  Arfeen, Leni DASEN, MD  multivitamin (ONE-A-DAY MEN'S) TABS tablet  Take 1 tablet by mouth daily.    [provider]  OXYGEN  Inhale 2 L/min into the lungs continuous.     [provider]  polyethylene glycol (MIRALAX  / GLYCOLAX ) 17 g packet Take 17 g by mouth daily. 09/10/23   Will Almarie MATSU, MD  risperiDONE  (RISPERDAL ) 3 MG tablet Take 1 tablet (3 mg total) by mouth 2 (two) times daily. 07/30/23   Arfeen, Leni DASEN, MD  sodium chloride  (OCEAN) 0.65 % SOLN nasal spray Place 1 spray into both nostrils as needed for congestion.    [provider]  tamsulosin  (FLOMAX ) 0.4 MG CAPS capsule Take 0.4 mg by mouth in the morning and at bedtime. 07/23/20   [provider]  traMADol  (ULTRAM ) 50 MG tablet Take 1 tablet (50 mg total) by mouth every 6 (six) hours as needed. 10/21/23   Shelah Lamar RAMAN, MD  TRELEGY ELLIPTA  100-62.5-25 MCG/ACT AEPB Inhale 1 puff into the lungs daily. 08/29/23   [provider]    Allergies: Patient has no known allergies.    Review of Systems  Unable to perform ROS: Acuity of condition  Constitutional:  Negative for fever.  Respiratory:  Positive for shortness of breath.   All other systems reviewed and are negative.   Updated Vital Signs BP 109/72   Pulse (!) 102   Temp (!) 96.4 F (35.8 C) (Temporal)   Resp 18   SpO2 99%   Physical Exam Vitals and nursing note reviewed.  Constitutional:      Appearance: He is well-developed.     Comments: Ill-appearing, acute distress  HENT:     Head: Normocephalic and atraumatic.  Eyes:     Pupils: Pupils are equal, round, and reactive to light.  Cardiovascular:     Rate and Rhythm: Regular rhythm. Tachycardia present.     Heart sounds: Normal heart sounds. No murmur heard. Pulmonary:     Effort: Tachypnea and accessory muscle usage present. No respiratory distress.     Breath sounds: Wheezing present.     Comments: Tripoding, poor air movement, tight, expiratory wheeze noted, mouth breathing Abdominal:     General: Bowel sounds are normal.      Palpations: Abdomen is soft.     Tenderness: There is no abdominal tenderness. There is no rebound.  Musculoskeletal:     Cervical back: Neck supple.     Right lower leg: No edema.     Left lower leg: No edema.  Lymphadenopathy:     Cervical: No cervical adenopathy.  Skin:    General: Skin is warm and dry.  Neurological:     Mental Status: He is alert.     Comments: Unable to assess orientation  Psychiatric:     Comments: Anxious     (all labs ordered are listed, but only abnormal results are displayed) Labs Reviewed  CBC WITH DIFFERENTIAL/PLATELET - Abnormal; Notable for the following components:      Result Value   Hemoglobin 12.0 (*)    HCT 37.9 (*)    Monocytes Absolute 1.6 (*)    Eosinophils Absolute 0.6 (*)    All other components within normal limits  BASIC METABOLIC PANEL WITH GFR - Abnormal; Notable for the following components:   Chloride 97 (*)    Glucose, Bld 165 (*)    Calcium  8.4 (*)    All other components within normal limits  I-STAT ARTERIAL BLOOD GAS, ED - Abnormal; Notable for the following components:   pH, Arterial 7.278 (*)    pCO2 arterial 62.6 (*)    pO2, Arterial 387 (*)    Bicarbonate 29.7 (*)    Sodium 134 (*)    HCT 35.0 (*)    Hemoglobin 11.9 (*)    All other components within normal limits  I-STAT CG4 LACTIC ACID, ED - Abnormal; Notable for the following components:   Lactic Acid, Venous 2.6 (*)    All other components within normal limits  BRAIN NATRIURETIC PEPTIDE  TROPONIN I (HIGH SENSITIVITY)    EKG: EKG Interpretation Date/Time:  Wednesday November 04 2023 06:14:22 EDT Ventricular Rate:  123 PR Interval:  134 QRS Duration:  86 QT Interval:  295 QTC Calculation: 422 R Axis:   83  Text Interpretation: Sinus tachycardia Borderline right axis deviation Anteroseptal infarct, old ST depr, consider ischemia, inferior leads Artifact in lead(s) V4 V5 V6 Confirmed by Bari Pfeiffer ([id number]) on 11/04/2023 7:06:25 AM  Radiology: DG  Chest Portable 1 View Result Date: 11/04/2023 EXAM: 1 VIEW XRAY OF THE CHEST 11/04/2023 06:24:00 AM COMPARISON: AP radiograph of the chest dated 10/28/2023. CLINICAL HISTORY: SOB. Respiratory distress/ SOB. Hx COPD. FINDINGS: LUNGS AND PLEURA: Extensive bullous emphysema present with irregular reticular scarring, similar to the prior exam. No focal pulmonary opacity. No pulmonary edema. No pleural effusion. No pneumothorax. HEART AND MEDIASTINUM: No acute abnormality of the cardiac and mediastinal silhouettes. BONES AND SOFT TISSUES: No acute osseous abnormality. IMPRESSION: 1. Extensive bullous emphysema with irregular reticular scarring, similar to the prior exam. Electronically signed by: Evalene Coho MD 11/04/2023 06:59 AM EDT RP Workstation: [id number]     .Critical Care  Performed by: Bari Pfeiffer FALCON, MD Authorized by: Bari Pfeiffer FALCON, MD   Critical care provider statement:    Critical care time (minutes):  45   Critical care was necessary to treat or prevent imminent or life-threatening deterioration of the following conditions:  Respiratory failure   Critical care was time spent personally by me on the following activities:  Development of treatment plan with patient or surrogate, discussions with consultants, evaluation of patient's response to treatment, examination of patient, ordering and review of laboratory studies, ordering and review of radiographic studies, ordering and performing treatments and interventions, pulse oximetry, re-evaluation of patient's condition and review of old charts    Medications Ordered in the ED  albuterol  (PROVENTIL ) (2.5 MG/3ML) 0.083% nebulizer solution (10 mg/hr Nebulization Given 11/04/23 0621)  LORazepam  (ATIVAN ) injection 0.5 mg (0.5 mg Intravenous Given 11/04/23 9388)                                    Medical Decision Making Amount and/or Complexity of Data Reviewed Labs: ordered. Radiology: ordered.  Risk Prescription drug  management.   This patient presents to the ED for concern of shortness of breath,  this involves an extensive number of treatment options, and is a complaint that carries with it a high risk of complications and morbidity.  I considered the following differential and admission for this acute, potentially life threatening condition.  The differential diagnosis includes COPD, pneumonia, pneumothorax, CHF, viral illness  MDM:    This is a 68 year old male who presents in acute respiratory distress.  Currently on CPAP.  He is anxious appearing and tripoding.  He was transition to BiPAP and given Ativan  for his anxiety.  He tolerated BiPAP much better after this.  Initial ABG shows acute on chronic respiratory acidosis with a pCO2 of 62 and a pH of 7.27.  No significant leukocytosis.  Metabolic panel at baseline.  EKG with tachycardia but no evidence of acute ischemia.  No evidence of pneumonia or pneumothorax on chest x-ray.  Severe emphysema noted.  On recheck, patient is improved on BiPAP.  He is resting more comfortably.  Patient was just admitted for the same.  Has pretty unstable and chronic COPD.  Will likely need readmission.  Signed out to oncoming provider for reassessment.  (Labs, imaging, consults)  Labs: I Ordered, and personally interpreted labs.  The pertinent results include: CBC, BMP, troponin, BNP, ABG  Imaging Studies ordered: I ordered imaging studies including x-ray I independently visualized and interpreted imaging. I agree with the radiologist interpretation  Additional history obtained from chart review.  External records from outside source obtained and reviewed including prior evaluations  Cardiac Monitoring: The patient was maintained on a cardiac monitor.  If on the cardiac monitor, I personally viewed and interpreted the cardiac monitored which showed an underlying rhythm of: Sinus  Reevaluation: After the interventions noted above, I reevaluated the patient and found  that they have :improved  Social Determinants of Health:  lives independently history of smoking  Disposition: Likely admission  Co morbidities that complicate the patient evaluation  Past Medical History:  Diagnosis Date   Abdominal pain 06/03/2013   Chest pain 08/10/2023   COPD (chronic obstructive pulmonary disease) (HCC)    with bullous emphysema.    COPD exacerbation (HCC) 08/09/2023   Heavy cigarette smoker before 2003   pt claims only 10 cigs per day, never heavier amounts.    HLD (hyperlipidemia)    Hypertension    Schizophrenia (HCC) 06/28/2013   This is a chronic condition and he lives with family     Medicines Meds ordered this encounter  Medications   albuterol  (PROVENTIL ) (2.5 MG/3ML) 0.083% nebulizer solution   LORazepam  (ATIVAN ) injection 0.5 mg    I have reviewed the patients home medicines and have made adjustments as needed  Problem List / ED Course: Problem List Items Addressed This Visit   None Visit Diagnoses       Acute on chronic respiratory failure with hypoxia and hypercapnia (HCC)    -  Primary                Final diagnoses:  Acute on chronic respiratory failure with hypoxia and hypercapnia [facility])    ED Discharge Orders     None          Bari Charmaine FALCON, MD 11/04/23 [phone]

## 2023-11-05 DIAGNOSIS — N400 Enlarged prostate without lower urinary tract symptoms: Secondary | ICD-10-CM

## 2023-11-05 DIAGNOSIS — J441 Chronic obstructive pulmonary disease with (acute) exacerbation: Secondary | ICD-10-CM | POA: Diagnosis not present

## 2023-11-05 DIAGNOSIS — E119 Type 2 diabetes mellitus without complications: Secondary | ICD-10-CM | POA: Diagnosis not present

## 2023-11-05 DIAGNOSIS — I10 Essential (primary) hypertension: Secondary | ICD-10-CM

## 2023-11-05 DIAGNOSIS — C3431 Malignant neoplasm of lower lobe, right bronchus or lung: Secondary | ICD-10-CM

## 2023-11-05 DIAGNOSIS — G9341 Metabolic encephalopathy: Secondary | ICD-10-CM | POA: Diagnosis not present

## 2023-11-05 LAB — CBC
HCT: 34.9 % — ABNORMAL LOW (ref 39.0–52.0)
Hemoglobin: 11.6 g/dL — ABNORMAL LOW (ref 13.0–17.0)
MCH: 27.2 pg (ref 26.0–34.0)
MCHC: 33.2 g/dL (ref 30.0–36.0)
MCV: 81.9 fL (ref 80.0–100.0)
Platelets: 368 K/uL (ref 150–400)
RBC: 4.26 MIL/uL (ref 4.22–5.81)
RDW: 15.5 % (ref 11.5–15.5)
WBC: 5.5 K/uL (ref 4.0–10.5)
nRBC: 0 % (ref 0.0–0.2)

## 2023-11-05 LAB — BASIC METABOLIC PANEL WITH GFR
Anion gap: 11 (ref 5–15)
BUN: 9 mg/dL (ref 8–23)
CO2: 26 mmol/L (ref 22–32)
Calcium: 9 mg/dL (ref 8.9–10.3)
Chloride: 97 mmol/L — ABNORMAL LOW (ref 98–111)
Creatinine, Ser: 0.79 mg/dL (ref 0.61–1.24)
GFR, Estimated: >60 mL/min
Glucose, Bld: 195 mg/dL — ABNORMAL HIGH (ref 70–99)
Potassium: 4.1 mmol/L (ref 3.5–5.1)
Sodium: 134 mmol/L — ABNORMAL LOW (ref 135–145)

## 2023-11-05 LAB — MAGNESIUM: Magnesium: 2.2 mg/dL (ref 1.7–2.4)

## 2023-11-05 LAB — PHOSPHORUS: Phosphorus: 2.6 mg/dL (ref 2.5–4.6)

## 2023-11-05 MED ORDER — FOLIC ACID 1 MG PO TABS
1.0000 mg | ORAL_TABLET | Freq: Every day | ORAL | Status: DC
  Administered 2023-11-05 – 2023-11-08 (×4): 1 mg via ORAL
  Filled 2023-11-05 (×4): qty 1

## 2023-11-05 MED ORDER — THIAMINE MONONITRATE 100 MG PO TABS
100.0000 mg | ORAL_TABLET | Freq: Every day | ORAL | Status: DC
  Administered 2023-11-05 – 2023-11-07 (×3): 100 mg via ORAL
  Filled 2023-11-05 (×3): qty 1

## 2023-11-05 MED ORDER — ADULT MULTIVITAMIN W/MINERALS CH
1.0000 | ORAL_TABLET | Freq: Every day | ORAL | Status: DC
  Administered 2023-11-05 – 2023-11-08 (×4): 1 via ORAL
  Filled 2023-11-05 (×4): qty 1

## 2023-11-05 MED ORDER — ALBUTEROL SULFATE (2.5 MG/3ML) 0.083% IN NEBU
2.5000 mg | INHALATION_SOLUTION | RESPIRATORY_TRACT | Status: DC
  Administered 2023-11-06 (×3): 2.5 mg via RESPIRATORY_TRACT
  Filled 2023-11-05 (×3): qty 3

## 2023-11-05 MED ORDER — ORAL CARE MOUTH RINSE: 15.0000 mL | OROMUCOSAL | Status: DC | PRN

## 2023-11-05 MED ORDER — DEXTROSE-SODIUM CHLORIDE 5-0.9 % IV SOLN: INTRAVENOUS | Status: DC

## 2023-11-05 MED ORDER — ALBUTEROL SULFATE (2.5 MG/3ML) 0.083% IN NEBU
2.5000 mg | INHALATION_SOLUTION | RESPIRATORY_TRACT | Status: DC | PRN
  Administered 2023-11-05 (×2): 2.5 mg via RESPIRATORY_TRACT
  Filled 2023-11-05 (×2): qty 3

## 2023-11-05 MED ORDER — BISACODYL 10 MG RE SUPP
10.0000 mg | Freq: Every day | RECTAL | Status: DC | PRN
  Administered 2023-11-05: 10 mg via RECTAL
  Filled 2023-11-05: qty 1

## 2023-11-05 NOTE — Assessment & Plan Note (Addendum)
 No signs of urinary retention  On flomax 

## 2023-11-05 NOTE — Assessment & Plan Note (Addendum)
 Clinically has resolved.  Mentation back to his baseline.

## 2023-11-05 NOTE — Evaluation (Signed)
 Occupational Therapy Evaluation Patient Details Name: Blake Mcknight MRN: [id number] DOB: [birth year] Today's Date: 11/05/2023   History of Present Illness   68 y.o. male presents to [facility] hospital on 11/04/2023 with SOB and hypoxia. PMH includes COPD (2L O2 baseline), lung CA s/p SBRT, HTN, HLD, schizophrenia, DM II.     Clinical Impressions Pt admitted based on above, and was seen based on problem list below. PTA pt was independent with ADLs. Today pt is requiring set up  to min  assist for ADLs. Bed mobility and functional transfers are  CGA with use of RW. During session pt with few verbalizations, only able to follow simple commands, perseverating on d/c, decreased attention and problem solving skills. No family present to determine baseline, per RN pt antipsychotics have been held pending swallow study, unsure if medications are affecting performance. Will continue to assess cognition in future sessions. Anticipate pt will progress, no follow up OT needs. OT will continue to follow acutely to maximize functional independence.        If plan is discharge home, recommend the following:   A little help with walking and/or transfers;A little help with bathing/dressing/bathroom;Supervision due to cognitive status     Functional Status Assessment   Patient has had a recent decline in their functional status and demonstrates the ability to make significant improvements in function in a reasonable and predictable amount of time.     Equipment Recommendations   None recommended by OT      Precautions/Restrictions   Precautions Precautions: Fall Recall of Precautions/Restrictions: Impaired Restrictions Weight Bearing Restrictions Per Provider Order: No     Mobility Bed Mobility Overal bed mobility: Needs Assistance Bed Mobility: Supine to Sit, Sit to Supine     Supine to sit: Supervision Sit to supine: Supervision   General bed mobility comments: S for safety with  lines    Transfers Overall transfer level: Needs assistance Equipment used: Rolling walker (2 wheels) Transfers: Sit to/from Stand, Bed to chair/wheelchair/BSC Sit to Stand: Contact guard assist     Step pivot transfers: Contact guard assist     General transfer comment: CGA for balance with mobility to bathroom      Balance Overall balance assessment: Needs assistance Sitting-balance support: No upper extremity supported, Feet supported Sitting balance-Leahy Scale: Fair     Standing balance support: Bilateral upper extremity supported, During functional activity, Reliant on assistive device for balance Standing balance-Leahy Scale: Poor Standing balance comment: Reliant on RW       ADL either performed or assessed with clinical judgement   ADL Overall ADL's : Needs assistance/impaired Eating/Feeding: Set up;Sitting   Grooming: Set up;Sitting           Upper Body Dressing : Set up;Sitting   Lower Body Dressing: Minimal assistance;Sit to/from stand Lower Body Dressing Details (indicate cue type and reason): Min assist for socks Toilet Transfer: Contact guard assist;Cueing for safety;Rolling walker (2 wheels);Ambulation;Regular Teacher, adult education Details (indicate cue type and reason): Cueing for safety with RW, CGA for balance Toileting- Clothing Manipulation and Hygiene: Supervision/safety;Sitting/lateral lean       Functional mobility during ADLs: Contact guard assist;Rolling walker (2 wheels) General ADL Comments: Pt requiring cues for safety and sequencing, decreased balance, cog limiting, potentially d/t medications     Vision Baseline Vision/History: 0 No visual deficits Vision Assessment?: No apparent visual deficits            Pertinent Vitals/Pain Pain Assessment Pain Assessment: No/denies pain Pain Intervention(s): Monitored during  session     Extremity/Trunk Assessment Upper Extremity Assessment Upper Extremity Assessment: Generalized  weakness   Lower Extremity Assessment Lower Extremity Assessment: Defer to PT evaluation   Cervical / Trunk Assessment Cervical / Trunk Assessment: Normal   Communication Communication Communication: Impaired Factors Affecting Communication: Difficulty expressing self   Cognition Arousal: Alert Behavior During Therapy: Flat affect, Restless, Impulsive Cognition: Cognition impaired, No family/caregiver present to determine baseline     Awareness: Online awareness impaired   Attention impairment (select first level of impairment): Sustained attention Executive functioning impairment (select all impairments): Problem solving OT - Cognition Comments: Per RN, pt's antipsychotic meds are on hold d/t swallowing difficulties. During session pt with limited verbalizations, restless, difficulty sustaining attention, perseverating on d/c but following simple commands       Following commands: Impaired Following commands impaired: Follows one step commands with increased time     Cueing  General Comments   Cueing Techniques: Verbal cues;Tactile cues;Visual cues  VSS on RA           Home Living Family/patient expects to be discharged to:: Private residence Living Arrangements: Other relatives Available Help at Discharge: Family;Available 24 hours/day Type of Home: House Home Access: Stairs to enter [employer] of Steps: 2 Entrance Stairs-Rails: None Home Layout: One level     Bathroom Shower/Tub: Chief Strategy Officer: Standard Bathroom Accessibility: Yes   Home Equipment: Wheelchair - Biomedical scientist (2 wheels)   Additional Comments: Home set up from prior admission. Pt unable to answer home set up questions. No family present      Prior Functioning/Environment Prior Level of Function : Needs assist       Mobility Comments: Per chart review pt normally mobilizes with no AD. Sister reports recent instability in last 2  weeks ADLs Comments: Per chart review: pt mod I with ADLs, use of shower chair. sister assists with IADLs    OT Problem List: Decreased strength;Decreased range of motion;Decreased activity tolerance;Impaired balance (sitting and/or standing);Decreased safety awareness;Decreased cognition;Decreased knowledge of use of DME or AE;Cardiopulmonary status limiting activity   OT Treatment/Interventions: Self-care/ADL training;Therapeutic exercise;Energy conservation;DME and/or AE instruction;Therapeutic activities;Patient/family education;Balance training      OT Goals(Current goals can be found in the care plan section)   Acute Rehab OT Goals Patient Stated Goal: To go home OT Goal Formulation: With patient Time For Goal Achievement: 11/19/23 Potential to Achieve Goals: Good   OT Frequency:  Min 2X/week       AM-PAC OT 6 Clicks Daily Activity     Outcome Measure Help from another person eating meals?: None Help from another person taking care of personal grooming?: A Little Help from another person toileting, which includes using toliet, bedpan, or urinal?: A Little Help from another person bathing (including washing, rinsing, drying)?: A Little Help from another person to put on and taking off regular upper body clothing?: A Little Help from another person to put on and taking off regular lower body clothing?: A Little 6 Click Score: 19   End of Session Equipment Utilized During Treatment: Rolling walker (2 wheels) Nurse Communication: Mobility status  Activity Tolerance: Patient tolerated treatment well Patient left: in bed;with call bell/phone within reach;with bed alarm set  OT Visit Diagnosis: Unsteadiness on feet (R26.81);Other abnormalities of gait and mobility (R26.89)                Time: 8841-8770 OT Time Calculation (min): 31 min Charges:  OT General Charges $OT Visit: 1 Visit  OT Evaluation $OT Eval Moderate Complexity: 1 Mod OT Treatments $Self Care/Home  Management : 8-22 mins  Adrianne BROCKS, OT  Acute Rehabilitation Services Office [phone] Secure chat preferred   Adrianne GORMAN Savers 11/05/2023, 12:59 PM

## 2023-11-05 NOTE — Assessment & Plan Note (Signed)
Continue anticoagulation with apixaban.  ?

## 2023-11-05 NOTE — Progress Notes (Signed)
 Mobility Specialist Progress Note:    11/05/23 1510  Mobility  Activity Dangled on edge of bed;Stood at bedside;Ambulated with assistance  Level of Assistance Contact guard assist, steadying assist  Assistive Device Other (Comment) (HHA)  Distance Ambulated (ft) 10 ft  Activity Response Tolerated fair  Mobility Referral No  Mobility visit 1 Mobility  Mobility Specialist Start Time (ACUTE ONLY) 1510  Mobility Specialist Stop Time (ACUTE ONLY) 1520  Mobility Specialist Time Calculation (min) (ACUTE ONLY) 10 min   Pt agreeable but seemed to be somewhat confused. Pt walked a short distance before suddenly changing mind and wanting to go back to sitting EOB. Pt then stood twice before finally sitting and demanding food. Left pt on EOB w/ all needs met. Notified RN.   Venetia Keel Mobility Specialist Please Neurosurgeon or Rehab Office at [phone]

## 2023-11-05 NOTE — Progress Notes (Addendum)
  Progress Note   Patient: Blake Mcknight FMW:[id number] DOB: November 29, 1955 DOA: 11/04/2023     1 DOS: the patient was seen and examined on 11/05/2023   Brief hospital course: Blake Mcknight was admitted to the hospital with the working diagnosis of COPD exacerbation.   68 yo male with the past medical history of COPD, lung cancer, hypertension, hyperlipidemia, schizophrenia and history of pulmonary embolism who presented with dyspnea.  Recent hospitalization 08/12 to 10/29/23 for COPD exacerbation, complicated with pneumonia. He was discharge with instructions to continue antibiotic therapy with cephalexin , doxycycline  and medrol  dose pack for steroids.  Apparently he was recovering well until 24 hrs prior to admission, that he developed confusion and somnolence.  EMS was called and he was found with 02 saturation of 60% on room air, he was placed on Cpap, given nebulized bronchodilator, methylprednisolone , epinephrine  and magnesium , then transported to the ED.  In the ED his blood pressure was 114/79, HR 103, RR 19 and 02 saturation 99% on 3 L/min per [place]  Lungs with expiratory wheezing, no rhonchi, heart with S1 and S2 present and regular, abdomen with no distention, no lower extremity edema.   Assessment and Plan: * COPD with acute exacerbation (HCC) Severe emphysema. Positive aspiration  02 saturation today is 94% on room air.  Plan to continue bronchodilator therapy Systemic steroids and antibiotics Oxymetry monitoring and supplemental 02 per [place] to keep 02 saturation 88% or greater.  Follow up with swallow evaluation, continue aspiration precautions   Acute metabolic encephalopathy Likely back to his baseline, difficult to assess due to probable baseline cognitive impairment.  Continue neuro checks per unit protocol   HTN (hypertension) Continue blood pressure monitoring   Controlled type 2 diabetes mellitus without complication, without long-term current use of insulin   (HCC) Continue glucose cover and monitoring with insulin  sliding scale   History of pulmonary embolism Continue anticoagulation with apixaban   BPH (benign prostatic hyperplasia) No signs of urinary retention   Schizophrenia (HCC) Continue with Risperdal , cogenting and remoron as needed hydroxyzine   Malignant neoplasm of lower lobe of right lung (HCC) Sp radiation therapy, need outpatient follow up with pulmonary         Subjective: Patient with persistent dyspnea, no chest pain, no PND or orthopnea   Physical Exam: Vitals:   11/05/23 0404 11/05/23 0700 11/05/23 0701 11/05/23 0723  BP: 119/75   130/75  Pulse: 85 84  89  Resp: 15 16  18   Temp: (!) 97.5 F (36.4 C)   98.5 F (36.9 C)  TempSrc: Axillary   Axillary  SpO2: 94% 93% 93% 92%  Weight: 59.5 kg     Height:       Neurology awake and alert ENT with mild pallor with no icterus Cardiovascular with S1 and S2 present and regular with no gallops or rubs, positive systolic murmur at the right lower sternal border Respiratory with no distant breath sounds with prolonged expiratory phase with no wheezing or rhonchi  Abdomen with no distention  No lower extremity edema   Data Reviewed:    Family Communication: no family at the bedside   Disposition: Status is: Inpatient Remains inpatient appropriate because: bronchodilator therapy   Planned Discharge Destination: Home     Author: Elidia Toribio Furnace, MD 11/05/2023 7:50 AM  For on call review [email or web address].

## 2023-11-05 NOTE — Assessment & Plan Note (Signed)
 Continue with Risperdal , cogenting and remoron as needed hydroxyzine 

## 2023-11-05 NOTE — Progress Notes (Signed)
   11/05/23 2220  Provider Notification  Provider Name/Title Harlene, RT  Date Provider Notified 11/05/23  Time Provider Notified 2221  Method of Notification Call  Notification Reason Requested by Pt's sister for breathing treatment. Pt is not in any acute respiratory distress, RR 16, SPO2 99% on 2 LPM, normal respiratory effort, lung sounds clear bilaterally. Eduaction provided to the family member that breathing treatment is PRN, not schedule q 4 hrs, but the  family prefers to be a regular schedule q 4 hrs.  Provider response RT will visit at bedside soon.  Date of Provider Response 11/05/23  Time of Provider Response 2221   Wendi Dash, RN

## 2023-11-05 NOTE — Progress Notes (Signed)
   11/05/23 2058  Provider Notification  Provider Name/Title Dr. Royal  Date Provider Notified 11/05/23  Time Provider Notified 2030  Method of Notification Page  Notification Reason Pt's care giver requests Dulcolax suppository PRN for over 3 days of constipation. This Pt is NPO. Awaiting for cortex tube placement with tube feeding possibly start tomorrow. Should he continue to get IV fluid?  Provider response Evaluate remotely;See new orders  Date of Provider Response 11/05/23  Time of Provider Response 2030    Wendi Dash, RN

## 2023-11-05 NOTE — Hospital Course (Addendum)
 Mr. Heavner was admitted to the hospital with the working diagnosis of COPD exacerbation.   68 yo male with the past medical history of COPD, lung cancer, hypertension, hyperlipidemia, schizophrenia and history of pulmonary embolism who presented with dyspnea.  Recent hospitalization 08/12 to 10/29/23 for COPD exacerbation, complicated with pneumonia. He was discharge with instructions to continue antibiotic therapy with cephalexin , doxycycline  and medrol  dose pack for steroids.  Apparently he was recovering well until 24 hrs prior to admission, that he developed confusion and somnolence.  EMS was called and he was found with 02 saturation of 60% on room air, he was placed on Cpap, given nebulized bronchodilator, methylprednisolone , epinephrine  and magnesium , then transported to the ED.  In the ED his blood pressure was 114/79, HR 103, RR 19 and 02 saturation 99% on 3 L/min per [place]  Lungs with expiratory wheezing, no rhonchi, heart with S1 and S2 present and regular, abdomen with no distention, no lower extremity edema.   ABG 7.27/ 62/ 387/ 29.7/ 100 Na 136 K 4.3 CL 97 bicarbonate 27 glucose 165 bun 9 cr 0,74  High sensitive troponin 6  Lactic acid 2,6  Wbc 10.5 hgb 12.0 plt 390   Chest radiograph with hyperinflation and diffuse bilateral bullae and emphysematous changes   EKG 123 bpm, normal axis, normal intervals, sinus rhythm with no significant ST segment or T wave changes.   Patient was placed on supplemental 02, bronchodilator therapy and steroids.  Swallow evaluation.  08/22 symptoms have improved, and diet has been advanced.  Spoke with his sister, and she agrees in palliative care at the time of discharge.  08/23 diet advanced to dysphagia 3, if good toleration and further clinical improvement, plan for discharge tomorrow with outpatient palliative care.  08/24 patient clinically improving, plan for discharge home and follow up as outpatient with palliative care, very poor prognosis  due to advanced emphysema.

## 2023-11-05 NOTE — Evaluation (Signed)
 Clinical/Bedside Swallow Evaluation Patient Details  Name: Blake Mcknight MRN: [id number] Date of Birth: [birth year]  Today's Date: 11/05/2023 Time: SLP Start Time (ACUTE ONLY): 0901 SLP Stop Time (ACUTE ONLY): 9082 SLP Time Calculation (min) (ACUTE ONLY): 16 min  Past Medical History:  Past Medical History:  Diagnosis Date   Abdominal pain 06/03/2013   Chest pain 08/10/2023   COPD (chronic obstructive pulmonary disease) (HCC)    with bullous emphysema.    COPD exacerbation (HCC) 08/09/2023   Heavy cigarette smoker before 2003   pt claims only 10 cigs per day, never heavier amounts.    HLD (hyperlipidemia)    Hypertension    Schizophrenia (HCC) 06/28/2013   This is a chronic condition and he lives with family   Past Surgical History:  Past Surgical History:  Procedure Laterality Date   CHOLECYSTECTOMY N/A 06/06/2013   Procedure: LAPAROSCOPIC CHOLECYSTECTOMY WITH ATTEMPTED INTRAOPERATIVE CHOLANGIOGRAM;  Surgeon: Dann FORBES Hummer, MD;  Location: [facility] OR;  Service: General;  Laterality: N/A;   ERCP N/A 06/03/2013   Procedure: ENDOSCOPIC RETROGRADE CHOLANGIOPANCREATOGRAPHY (ERCP);  Surgeon: Gwendlyn ONEIDA Buddy, MD;  Location: [facility] ENDOSCOPY;  Service: Endoscopy;  Laterality: N/A;   HPI:  68 y.o. male presents to [facility] hospital on 11/04/2023 with SOB and hypoxia. Per MD H&P note, pt has also been experiencing difficulty swallowing and clearing his secretions, increased AMS, and recurrent PNA (recently hospitalized 8/12-8/14 with COPD exacerbation and possible PNA). Previous clinical swallowing evaluations (2017, 2018) were [facility]. PMH includes COPD (2L O2 baseline), lung CA s/p SBRT, HTN, HLD, schizophrenia, DM II.    Assessment / Plan / Recommendation  Clinical Impression  Pt keeps his eyes closed and does not follow a lot of commands, but engages in PO trials with some automatic responses. Coughing is noted only occasionally with thin liquids, and not with purees, although he does take very  little puree at a time given that he purses his lips to the spoon as if it were a straw. There are not a lot of overt signs of aspiration, but his mentation may increase risk and he has reportedly had recurrent PNA. He would likely benefit from MBS as mentation allows. Until he is ready for this, would offer ice chips after oral care with staff supervision. Could also try to offer meds crushed in puree.   SLP Visit Diagnosis: Dysphagia, unspecified (R13.10)    Aspiration Risk       Diet Recommendation NPO except meds;Ice chips PRN after oral care    Medication Administration: Crushed with puree    Other  Recommendations Oral Care Recommendations: Oral care prior to ice chip/H20     Assistance Recommended at Discharge    Functional Status Assessment Patient has had a recent decline in their functional status and demonstrates the ability to make significant improvements in function in a reasonable and predictable amount of time.  Frequency and Duration min 2x/week  2 weeks       Prognosis Prognosis for improved oropharyngeal function: Good Barriers to Reach Goals: Cognitive deficits      Swallow Study   General HPI: 68 y.o. male presents to [facility] hospital on 11/04/2023 with SOB and hypoxia. Per MD H&P note, pt has also been experiencing difficulty swallowing and clearing his secretions, increased AMS, and recurrent PNA (recently hospitalized 8/12-8/14 with COPD exacerbation and possible PNA). Previous clinical swallowing evaluations (2017, 2018) were [facility]. PMH includes COPD (2L O2 baseline), lung CA s/p SBRT, HTN, HLD, schizophrenia, DM II. Type of Study: Bedside  Swallow Evaluation Previous Swallow Assessment: see HPI Diet Prior to this Study: NPO Temperature Spikes Noted: No Respiratory Status: Room air History of Recent Intubation: No Behavior/Cognition: Requires cueing Oral Cavity Assessment:  (limited visibility) Oral Care Completed by SLP: Yes Oral Cavity - Dentition: Adequate  natural dentition;Missing dentition (as can be seen) Vision:  (keeps eyes closed) Self-Feeding Abilities: Total assist Patient Positioning: Upright in bed Baseline Vocal Quality: Not observed Volitional Cough: Cognitively unable to elicit Volitional Swallow: Unable to elicit    Oral/Motor/Sensory Function Overall Oral Motor/Sensory Function:  (not following commands for assessment, no overt asymmetry observed)   Ice Chips Ice chips: Within functional limits Presentation: Spoon   Thin Liquid Thin Liquid: Impaired Presentation: Spoon;Cup;Straw Pharyngeal  Phase Impairments: Cough - Immediate    Nectar Thick Nectar Thick Liquid: Not tested   Honey Thick Honey Thick Liquid: Not tested   Puree Puree: Impaired Presentation: Spoon Oral Phase Impairments: Poor awareness of bolus   Solid     Solid: Not tested      Leita SAILOR., M.A. CCC-SLP Acute Rehabilitation Services Office: [phone]  Secure chat preferred  11/05/2023,9:20 AM

## 2023-11-05 NOTE — Assessment & Plan Note (Addendum)
 Patient was placed on insulin  siding scale for  glucose cover and monitoring. His glucose remained stable during his hospitalization.  Resume Januvia at the time of discharge Continue statin therapy.

## 2023-11-05 NOTE — Assessment & Plan Note (Signed)
 Sp radiation therapy, need outpatient follow up with pulmonary

## 2023-11-05 NOTE — Assessment & Plan Note (Addendum)
 Continue with carvedilol  for blood pressure control.

## 2023-11-05 NOTE — Assessment & Plan Note (Addendum)
 Severe emphysema. Acute on chronic hypoxemic and hypercapnic respiratory failure  Today 02 saturation today is 99% on 2 L/min per [place]   Patient was placed on aggressive bronchodilator therapy, systemic steroids and airway clearing techniques with flutter valve and incentive spirometer.  Continue with LABA, ICS and LAMA   Continue aspiration precautions.  Patient with very poor prognosis due to sever emphysematous changes, his sister agrees in palliative care as outpatient to prevent re hospitalizations and increased quality of life.   Supplemental 02 per [place] to keep 02 saturation 88% or greater.

## 2023-11-05 NOTE — Progress Notes (Addendum)
 Initial Nutrition Assessment  DOCUMENTATION CODES:   Not applicable  INTERVENTION:  -NPO per Speech Therapy, will have MBS -Continue D5 per MD for BG levels -If diet is upgraded add ONS TID -Add MVI, Folic Acid , Thiamine  -Pt is at risk for refeeding syndrome, will order baseline phos, mg -If pt is unable to consume PO intake, recommend considering Cortrak and Enteral Nutrition  -Initiate Osmolite 1.2 continuous @ 20 ml/hr to be advanced as tolerated 10 ml q-6 to a goal rate of 60 ml/hr. FWF 100 ml q-4  At goal this would provide 1728 kcal, 80 g protein, 1781 ml (formula and flushes)  NUTRITION DIAGNOSIS:   Moderate Malnutrition related to chronic illness, decreased appetite as evidenced by mild fat depletion, percent weight loss.  GOAL:   Patient will meet greater than or equal to 90% of their needs  MONITOR:   PO intake, Diet advancement, Labs, Weight trends, Skin  REASON FOR ASSESSMENT:   Consult Assessment of nutrition requirement/status  ASSESSMENT:   Hx COPD, lung cancer, and chronic hypoxic respiratory failure hypertension, hyperlipidemia, history of PE on Eliquis , and schizophrenia presents with shortness of breath with increased lethargy.  Attempted to speak to pt at bedside. Pt resting at start of visit, easily roused to voice. Pt not able to answer questions appropriately or follow many commands. Pt continues to be NPO per ST r/t mentation, recommendation for MBS once mentation allows. Pt with no noted n/v/c/d, pt with recent swallowing difficulties. Pt H+P, pt with poor PO intake recently. Per weight hx in chart, pt with documented weight loss of 14.4 kg (20%) x 1 yr, which is severe. NFPE attempted. Was able to assess some areas, however, pt not following commands appropriately which prevented assessment of other areas. Per significant weight loss as well as assessed areas pt does meet criteria for moderate protein calorie malnutrition. Will continue to monitor for  PO diet upgrade or need for nutrition support. RDN available prn.   Labs Na 134 BG 195 H/H 11.6/24.9  Medications  enoxaparin  (LOVENOX ) injection  1 mg/kg Subcutaneous Q12H   folic acid   1 mg Oral Daily   methylPREDNISolone  (SOLU-MEDROL ) injection  40 mg Intravenous Q12H   multivitamin with minerals  1 tablet Oral Daily   pantoprazole  (PROTONIX ) IV  40 mg Intravenous Q24H   sodium chloride  flush  3 mL Intravenous Q12H   thiamine   100 mg Oral Daily     NUTRITION - FOCUSED PHYSICAL EXAM:  Flowsheet Row Most Recent Value  Orbital Region Mild depletion  Upper Arm Region Unable to assess  Thoracic and Lumbar Region Unable to assess  Buccal Region Mild depletion  Temple Region Moderate depletion  Clavicle Bone Region Unable to assess  Clavicle and Acromion Bone Region Unable to assess  Scapular Bone Region Unable to assess  Dorsal Hand Mild depletion  Patellar Region Unable to assess  Anterior Thigh Region Unable to assess  Posterior Calf Region Unable to assess  Edema (RD Assessment) Mild  Hair Reviewed  Eyes Reviewed  Mouth Unable to assess  Skin Reviewed  Nails Reviewed    Diet Order:   Diet Order             Diet NPO time specified Except for: Ice Chips, Other (See Comments)  Diet effective now                   EDUCATION NEEDS:   Not appropriate for education at this time  Skin:  Skin Assessment: Reviewed RN  Assessment  Last BM:  PTA  Height:   Ht Readings from Last 1 Encounters:  11/04/23 5' 6 (1.676 m)    Weight:   Wt Readings from Last 1 Encounters:  11/05/23 59.5 kg    BMI:  Body mass index is 21.17 kg/m.  Estimated Nutritional Needs:   Kcal:  1500-1800 kcal  Protein:  70-90 g  Fluid:  >/=1.5L  Ademola Vert Daml-Budig, RDN, LDN Registered Dietitian Nutritionist RD Inpatient Contact Info in [place]

## 2023-11-06 ENCOUNTER — Inpatient Hospital Stay (HOSPITAL_COMMUNITY)

## 2023-11-06 DIAGNOSIS — J441 Chronic obstructive pulmonary disease with (acute) exacerbation: Secondary | ICD-10-CM | POA: Diagnosis not present

## 2023-11-06 DIAGNOSIS — I10 Essential (primary) hypertension: Secondary | ICD-10-CM | POA: Diagnosis not present

## 2023-11-06 DIAGNOSIS — Z86711 Personal history of pulmonary embolism: Secondary | ICD-10-CM | POA: Diagnosis not present

## 2023-11-06 DIAGNOSIS — G9341 Metabolic encephalopathy: Secondary | ICD-10-CM | POA: Diagnosis not present

## 2023-11-06 LAB — CBC
HCT: 33 % — ABNORMAL LOW (ref 39.0–52.0)
Hemoglobin: 10.9 g/dL — ABNORMAL LOW (ref 13.0–17.0)
MCH: 26.9 pg (ref 26.0–34.0)
MCHC: 33 g/dL (ref 30.0–36.0)
MCV: 81.5 fL (ref 80.0–100.0)
Platelets: 340 K/uL (ref 150–400)
RBC: 4.05 MIL/uL — ABNORMAL LOW (ref 4.22–5.81)
RDW: 15.5 % (ref 11.5–15.5)
WBC: 10.5 K/uL (ref 4.0–10.5)
nRBC: 0 % (ref 0.0–0.2)

## 2023-11-06 LAB — BASIC METABOLIC PANEL WITH GFR
Anion gap: 13 (ref 5–15)
BUN: 7 mg/dL — ABNORMAL LOW (ref 8–23)
CO2: 26 mmol/L (ref 22–32)
Calcium: 8.8 mg/dL — ABNORMAL LOW (ref 8.9–10.3)
Chloride: 100 mmol/L (ref 98–111)
Creatinine, Ser: 0.63 mg/dL (ref 0.61–1.24)
GFR, Estimated: >60 mL/min (ref >60)
Glucose, Bld: 155 mg/dL — ABNORMAL HIGH (ref 70–99)
Potassium: 4 mmol/L (ref 3.5–5.1)
Sodium: 139 mmol/L (ref 135–145)

## 2023-11-06 MED ORDER — CARVEDILOL 6.25 MG PO TABS
6.2500 mg | ORAL_TABLET | Freq: Two times a day (BID) | ORAL | Status: DC
  Administered 2023-11-06 – 2023-11-08 (×4): 6.25 mg via ORAL
  Filled 2023-11-06 (×4): qty 1

## 2023-11-06 MED ORDER — ALBUTEROL SULFATE (2.5 MG/3ML) 0.083% IN NEBU: 2.5000 mg | INHALATION_SOLUTION | RESPIRATORY_TRACT | Status: DC | PRN

## 2023-11-06 MED ORDER — FLUTICASONE FUROATE-VILANTEROL 100-25 MCG/ACT IN AEPB: 1.0000 | INHALATION_SPRAY | Freq: Every day | RESPIRATORY_TRACT | Status: DC

## 2023-11-06 MED ORDER — HYDROXYZINE HCL 25 MG PO TABS: 25.0000 mg | ORAL_TABLET | Freq: Three times a day (TID) | ORAL | Status: DC | PRN

## 2023-11-06 MED ORDER — BENZTROPINE MESYLATE 2 MG PO TABS
2.0000 mg | ORAL_TABLET | Freq: Two times a day (BID) | ORAL | Status: DC
  Administered 2023-11-06 – 2023-11-08 (×5): 2 mg via ORAL
  Filled 2023-11-06 (×6): qty 1

## 2023-11-06 MED ORDER — TAMSULOSIN HCL 0.4 MG PO CAPS
0.4000 mg | ORAL_CAPSULE | Freq: Every day | ORAL | Status: DC
  Administered 2023-11-06 – 2023-11-07 (×2): 0.4 mg via ORAL
  Filled 2023-11-06 (×2): qty 1

## 2023-11-06 MED ORDER — METHOCARBAMOL 750 MG PO TABS: 750.0000 mg | ORAL_TABLET | Freq: Three times a day (TID) | ORAL | Status: DC | PRN

## 2023-11-06 MED ORDER — ENSURE PLUS HIGH PROTEIN PO LIQD
237.0000 mL | Freq: Three times a day (TID) | ORAL | Status: DC
  Administered 2023-11-06 – 2023-11-08 (×5): 237 mL via ORAL

## 2023-11-06 MED ORDER — APIXABAN 5 MG PO TABS
5.0000 mg | ORAL_TABLET | Freq: Two times a day (BID) | ORAL | Status: DC
  Administered 2023-11-06 – 2023-11-08 (×5): 5 mg via ORAL
  Filled 2023-11-06 (×5): qty 1

## 2023-11-06 MED ORDER — BUDESON-GLYCOPYRROL-FORMOTEROL 160-9-4.8 MCG/ACT IN AERO
2.0000 | INHALATION_SPRAY | Freq: Two times a day (BID) | RESPIRATORY_TRACT | Status: DC
  Administered 2023-11-07 – 2023-11-08 (×3): 2 via RESPIRATORY_TRACT
  Filled 2023-11-06 (×2): qty 5.9

## 2023-11-06 MED ORDER — IPRATROPIUM-ALBUTEROL 0.5-2.5 (3) MG/3ML IN SOLN: 3.0000 mL | Freq: Four times a day (QID) | RESPIRATORY_TRACT | Status: DC

## 2023-11-06 MED ORDER — PANTOPRAZOLE SODIUM 40 MG PO TBEC
40.0000 mg | DELAYED_RELEASE_TABLET | Freq: Every day | ORAL | Status: DC
  Administered 2023-11-07 – 2023-11-08 (×2): 40 mg via ORAL
  Filled 2023-11-06 (×2): qty 1

## 2023-11-06 MED ORDER — RISPERIDONE 3 MG PO TABS
3.0000 mg | ORAL_TABLET | Freq: Two times a day (BID) | ORAL | Status: DC
  Administered 2023-11-06 – 2023-11-08 (×5): 3 mg via ORAL
  Filled 2023-11-06 (×6): qty 1

## 2023-11-06 MED ORDER — MIRTAZAPINE 15 MG PO TABS: 30.0000 mg | ORAL_TABLET | Freq: Every evening | ORAL | Status: DC | PRN

## 2023-11-06 MED ORDER — METHYLPREDNISOLONE SODIUM SUCC 40 MG IJ SOLR
40.0000 mg | Freq: Every day | INTRAMUSCULAR | Status: DC
  Administered 2023-11-07: 40 mg via INTRAVENOUS
  Filled 2023-11-06: qty 1

## 2023-11-06 MED ORDER — ALBUTEROL SULFATE (2.5 MG/3ML) 0.083% IN NEBU
2.5000 mg | INHALATION_SOLUTION | RESPIRATORY_TRACT | Status: DC
  Administered 2023-11-06 – 2023-11-07 (×3): 2.5 mg via RESPIRATORY_TRACT
  Filled 2023-11-06 (×3): qty 3

## 2023-11-06 MED ORDER — ATORVASTATIN CALCIUM 10 MG PO TABS
10.0000 mg | ORAL_TABLET | Freq: Every day | ORAL | Status: DC
  Administered 2023-11-06 – 2023-11-07 (×2): 10 mg via ORAL
  Filled 2023-11-06 (×2): qty 1

## 2023-11-06 MED ORDER — ALBUTEROL SULFATE (2.5 MG/3ML) 0.083% IN NEBU
2.5000 mg | INHALATION_SOLUTION | Freq: Four times a day (QID) | RESPIRATORY_TRACT | Status: DC
  Administered 2023-11-06 (×2): 2.5 mg via RESPIRATORY_TRACT
  Filled 2023-11-06 (×2): qty 3

## 2023-11-06 NOTE — Progress Notes (Addendum)
 Progress Note   Patient: Blake Mcknight FMW:[id number] DOB: 29-Jan-1956 DOA: 11/04/2023     2 DOS: the patient was seen and examined on 11/06/2023   Brief hospital course: Mr. Rackley was admitted to the hospital with the working diagnosis of COPD exacerbation.   68 yo male with the past medical history of COPD, lung cancer, hypertension, hyperlipidemia, schizophrenia and history of pulmonary embolism who presented with dyspnea.  Recent hospitalization 08/12 to 10/29/23 for COPD exacerbation, complicated with pneumonia. He was discharge with instructions to continue antibiotic therapy with cephalexin , doxycycline  and medrol  dose pack for steroids.  Apparently he was recovering well until 24 hrs prior to admission, that he developed confusion and somnolence.  EMS was called and he was found with 02 saturation of 60% on room air, he was placed on Cpap, given nebulized bronchodilator, methylprednisolone , epinephrine  and magnesium , then transported to the ED.  In the ED his blood pressure was 114/79, HR 103, RR 19 and 02 saturation 99% on 3 L/min per [place]  Lungs with expiratory wheezing, no rhonchi, heart with S1 and S2 present and regular, abdomen with no distention, no lower extremity edema.   ABG 7.27/ 62/ 387/ 29.7/ 100 Na 136 K 4.3 CL 97 bicarbonate 27 glucose 165 bun 9 cr 0,74  High sensitive troponin 6  Lactic acid 2,6  Wbc 10.5 hgb 12.0 plt 390   Chest radiograph with hyperinflation and diffuse bilateral bullae and emphysematous changes   EKG 123 bpm, normal axis, normal intervals, sinus rhythm with no significant ST segment or T wave changes.   Patient was placed on supplemental 02, bronchodilator therapy and steroids.  Swallow evaluation.  08/22 symptoms have improved, and diet has been advanced.  Spoke with his sister, and she agrees in palliative care at the time of discharge.   Assessment and Plan: * COPD with acute exacerbation (HCC) Severe emphysema. 02 saturation today  is 90% on 2 L/min per [place]   Resume LABA, ICS and LAMA Continue systemic corticosteroids with methylprednisolone  40 mg IV daily.  Airway clearing techniques with flutter valve and incentive spirometer.  Oxymetry monitoring and supplemental 02 per [place] to keep 02 saturation 88% or greater.  Continue aspiration precautions  Patient with very poor prognosis due to sever emphysematous changes, his sister agrees in palliative care as outpatient to prevent re hospitalizations and increased quality of life.   Acute metabolic encephalopathy Clinically has resolved.  Discontinue haldol  for now   HTN (hypertension) Continue blood pressure monitoring  Resume carvedilol  for blood pressure control.   Type 2 diabetes mellitus with hyperlipidemia (HCC) Continue glucose cover and monitoring with insulin  sliding scale  Continue statin therapy.   History of pulmonary embolism Continue anticoagulation with apixaban   BPH (benign prostatic hyperplasia) No signs of urinary retention  Resume flomax    Schizophrenia (HCC) Continue with Risperdal , cogenting and mirtazapine . As needed hydroxyzine   Malignant neoplasm of lower lobe of right lung (HCC) Sp radiation therapy, need outpatient follow up with pulmonary       Subjective: Patient with improved swallowing, no chest pain or agitation, dyspnea is improving, continue to have cough and increased sputum production   Physical Exam: Vitals:   11/06/23 0419 11/06/23 0754 11/06/23 1102 11/06/23 1128  BP:  (!) 154/78    Pulse:  62    Resp:  18  16  Temp:  98.3 F (36.8 C)    TempSrc:  Oral    SpO2: 100% 96% 90% 98%  Weight:  Height:       Neurology awake and alert, disorientated at baseline ENT with mild pallor Cardiovascular with S1 and S2 present and regular with no gallops, rubs or murmurs No JVD Respiratory with distant breath sounds and scattered rhonchi, no wheezing or rales.  Abdomen with no distention  No lower extremity edema    Data Reviewed:    Family Communication: I spoke with patient's sister at the bedside, we talked in detail about patient's condition, plan of care and prognosis and all questions were addressed.   Disposition: Status is: Inpatient Remains inpatient appropriate because: IV steroids and bronchodilators   Planned Discharge Destination: Home    Author: Elidia Toribio Furnace, MD 11/06/2023 12:15 PM  For on call review [email or web address].

## 2023-11-06 NOTE — Progress Notes (Signed)
 Speech Language Pathology Treatment: Dysphagia  Patient Details Name: CAMPBELL KRAY MRN: [id number] DOB: 1955/10/05 Today's Date: 11/06/2023 Time: 9182-9154 SLP Time Calculation (min) (ACUTE ONLY): 28 min  Assessment / Plan / Recommendation Clinical Impression  Pt is more alert this morning and asking for POs. His sister is at bedside, and she describes limited PO intake at home recently, but not necessarily a lot of observed difficulty. She is not sure if this is his second case of PNA vs persistent infection. Pt has no coughing with thin liquids today, but did cough x1 with his mouth full of food. His sister believes that this is related to phlegm that he has been coughing up (has a spit cup at bedside), which is possible but cannot be confirmed at bedside when his mouth was full of food. I do think he is ready to be on a PO diet, but discussed MBS as an option with them given recurrent PNA. They would like to proceed with MBS prior to initiating PO diet. Can offer ice chips and small sips of water pending completion, which is tentatively scheduled with radiology for later this morning.    HPI HPI: 68 y.o. male presents to [facility] hospital on 11/04/2023 with SOB and hypoxia. Per MD H&P note, pt has also been experiencing difficulty swallowing and clearing his secretions, increased AMS, and recurrent PNA (recently hospitalized 8/12-8/14 with COPD exacerbation and possible PNA). Previous clinical swallowing evaluations (2017, 2018) were [facility]. PMH includes COPD (2L O2 baseline), lung CA s/p SBRT, HTN, HLD, schizophrenia, DM II.      SLP Plan  MBS          Recommendations  Diet recommendations: NPO;Other(comment) (ice chips and small sips of water pending MBS) Medication Administration: Crushed with puree                  Oral care prior to ice chip/H20     Dysphagia, unspecified (R13.10)     MBS     Leita SAILOR., M.A. CCC-SLP Acute Rehabilitation Services Office:  [phone]  Secure chat preferred   11/06/2023, 8:53 AM

## 2023-11-06 NOTE — Progress Notes (Signed)
 Physical Therapy Treatment Patient Details Name: Blake Mcknight MRN: [id number] DOB: Jul 17, 1955 Today's Date: 11/06/2023   History of Present Illness 68 y.o. male adm 11/04/2023 with SOB, hypoxia, COPD exacerbation. PMHx: COPD (2L O2 baseline), lung CA s/p SBRT, HTN, HLD, schizophrenia, DM II.    PT Comments  Pt maintains eyes closed in bed despite cues and arousal. Pt initially willing to get up and move but then only agreeable to toilet and back to bed with increased cues and encouragement to attempt 2nd attempt with gait to door and back, limited by pt. Pt refused sitting in chair or further activity. Per sister still with increased confusion from baseline but tends to perform mobility at his desire at baseline not on command. Encouraged continued attempts for OOB and walking with assist. Will continue to follow.   sPO2 90% on 2L with gait with cues for pursed lip breathing 93% on 3L with gait   If plan is discharge home, recommend the following: A little help with walking and/or transfers;A little help with bathing/dressing/bathroom;Assistance with cooking/housework;Direct supervision/assist for medications management;Direct supervision/assist for financial management;Assist for transportation;Help with stairs or ramp for entrance;Supervision due to cognitive status   Can travel by private vehicle        Equipment Recommendations  None recommended by PT    Recommendations for Other Services       Precautions / Restrictions Precautions Precautions: Fall Recall of Precautions/Restrictions: Impaired Precaution/Restrictions Comments: inconsistently follows commands, limited speech     Mobility  Bed Mobility Overal bed mobility: Needs Assistance Bed Mobility: Supine to Sit, Sit to Supine     Supine to sit: Supervision     General bed mobility comments: supervision for lines and safety, impulsive with movement, no physical assist x 2 trials supine<>sit     Transfers Overall transfer level: Needs assistance   Transfers: Sit to/from Stand Sit to Stand: Contact guard assist           General transfer comment: CGA for lines and safety, pt impulsive with movement. able to stand from bed and toilet    Ambulation/Gait Ambulation/Gait assistance: Contact guard assist Gait Distance (Feet): 30 Feet Assistive device: None Gait Pattern/deviations: Step-through pattern, Decreased stride length   Gait velocity interpretation: 1.31 - 2.62 ft/sec, indicative of limited community ambulator   General Gait Details: pt with steady gait with CGA for safety and lines. pt walked 15' x 2 then 69' with seated rest between all. limited by pt cognition and willingness to participate   Stairs             Wheelchair Mobility     Tilt Bed    Modified Rankin (Stroke Patients Only)       Balance Overall balance assessment: Needs assistance Sitting-balance support: No upper extremity supported, Feet supported Sitting balance-Leahy Scale: Good       Standing balance-Leahy Scale: Good                              Communication Communication Communication: Impaired Factors Affecting Communication: Difficulty expressing self  Cognition Arousal: Alert Behavior During Therapy: Flat affect, Impulsive   PT - Cognitive impairments: History of cognitive impairments                       PT - Cognition Comments: h/o schizophrenia. inconsistently follows commands when pt desires, limited verbalizations Following commands: Impaired Following commands impaired: Follows one step commands with  increased time, Follows one step commands inconsistently    Cueing Cueing Techniques: Verbal cues, Tactile cues, Visual cues  Exercises      General Comments        Pertinent Vitals/Pain Pain Assessment Pain Assessment: No/denies pain    Home Living                          Prior Function            PT Goals  (current goals can now be found in the care plan section) Progress towards PT goals: Progressing toward goals    Frequency    Min 2X/week      PT Plan      Co-evaluation              AM-PAC PT 6 Clicks Mobility   Outcome Measure  Help needed turning from your back to your side while in a flat bed without using bedrails?: None Help needed moving from lying on your back to sitting on the side of a flat bed without using bedrails?: A Little Help needed moving to and from a bed to a chair (including a wheelchair)?: A Little Help needed standing up from a chair using your arms (e.g., wheelchair or bedside chair)?: A Little Help needed to walk in hospital room?: A Little Help needed climbing 3-5 steps with a railing? : A Little 6 Click Score: 19    End of Session Equipment Utilized During Treatment: Oxygen ;Gait belt Activity Tolerance: Patient limited by fatigue Patient left: in bed;with call bell/phone within reach;with bed alarm set;with family/visitor present Nurse Communication: Mobility status PT Visit Diagnosis: Other abnormalities of gait and mobility (R26.89);Muscle weakness (generalized) (M62.81);Unsteadiness on feet (R26.81)     Time: 9051-8982 PT Time Calculation (min) (ACUTE ONLY): 29 min  Charges:    $Therapeutic Activity: 23-37 mins PT General Charges $$ ACUTE PT VISIT: 1 Visit                     Lenoard SQUIBB, PT Acute Rehabilitation Services Office: [phone]    Lenoard NOVAK Lizmarie Witters 11/06/2023, 11:09 AM

## 2023-11-06 NOTE — Progress Notes (Signed)
 Occupational Therapy Treatment Patient Details Name: Blake Mcknight MRN: [id number] DOB: 1956/02/21 Today's Date: 11/06/2023   History of present illness 68 y.o. male adm 11/04/2023 with SOB, hypoxia, COPD exacerbation. PMHx: COPD (2L O2 baseline), lung CA s/p SBRT, HTN, HLD, schizophrenia, DM II.   OT comments  Pt progressing well towards goals. Increased functional mobility distance to ~30ft with RW at [facility]. O2 saturation at 98% or greater on 2L. Pt's sister present for session and supportive; reporting pt with slight decrease in cog from baseline, but improvement from initial eval. Pt limited by decreased activity tolerance. Both pt and sister would benefit from continued education regarding energy conservation strategies. Continue to recommend No follow up OT  to optimize independence levels. Will continue to follow acutely.       If plan is discharge home, recommend the following:  A little help with walking and/or transfers;A little help with bathing/dressing/bathroom;Supervision due to cognitive status   Equipment Recommendations  None recommended by OT       Precautions / Restrictions Precautions Precautions: Fall Recall of Precautions/Restrictions: Impaired Precaution/Restrictions Comments: inconsistently follows commands, limited speech Restrictions Weight Bearing Restrictions Per Provider Order: No       Mobility Bed Mobility Overal bed mobility: Needs Assistance Bed Mobility: Supine to Sit, Sit to Supine     Supine to sit: Supervision Sit to supine: Supervision   General bed mobility comments: S for safety d/t impulsiveness    Transfers Overall transfer level: Needs assistance Equipment used: Rolling walker (2 wheels) Transfers: Sit to/from Stand Sit to Stand: Contact guard assist       General transfer comment: CGA with RW, cues for safety, proper RW spacing, and pursed lip breathing. Mobilizing ~46ft in hallway     Balance Overall balance assessment:  Needs assistance Sitting-balance support: No upper extremity supported, Feet supported Sitting balance-Leahy Scale: Good     Standing balance support: Bilateral upper extremity supported, During functional activity, Reliant on assistive device for balance Standing balance-Leahy Scale: Good Standing balance comment: Reliant on RW         ADL either performed or assessed with clinical judgement   ADL Overall ADL's : Needs assistance/impaired Eating/Feeding: Set up;Sitting         Toilet Transfer: Contact guard assist;Cueing for safety;Rolling walker (2 wheels);Ambulation Toilet Transfer Details (indicate cue type and reason): Simulated in room, cueing for safety with RW, and cues for pursed lip breathing       Functional mobility during ADLs: Contact guard assist;Rolling walker (2 wheels) General ADL Comments: Cues for safety, less impulsive than eval. Decreased activity tolerance    Extremity/Trunk Assessment Upper Extremity Assessment Upper Extremity Assessment: Generalized weakness   Lower Extremity Assessment Lower Extremity Assessment: Defer to PT evaluation        Vision   Vision Assessment?: No apparent visual deficits         Communication Communication Communication: Impaired Factors Affecting Communication: Difficulty expressing self   Cognition Arousal: Alert Behavior During Therapy: Flat affect, Impulsive Cognition: Cognition impaired     Awareness: Online awareness impaired     Executive functioning impairment (select all impairments): Problem solving OT - Cognition Comments: Per pt's sister, cog is slightly below baseline. Decreased attention and situational awareness       Following commands: Impaired Following commands impaired: Follows one step commands with increased time, Follows one step commands inconsistently      Cueing   Cueing Techniques: Verbal cues, Tactile cues, Visual cues  General Comments O2 sats stable on 2L     Pertinent Vitals/ Pain       Pain Assessment Pain Assessment: No/denies pain Pain Intervention(s): Monitored during session   Frequency  Min 2X/week        Progress Toward Goals  OT Goals(current goals can now be found in the care plan section)  Progress towards OT goals: Progressing toward goals  Acute Rehab OT Goals Patient Stated Goal: To rest OT Goal Formulation: With patient Time For Goal Achievement: 11/19/23 Potential to Achieve Goals: Good ADL Goals Pt Will Perform Grooming: with modified independence;standing Pt Will Perform Lower Body Dressing: with modified independence;sit to/from stand;sitting/lateral leans Pt Will Transfer to Toilet: with modified independence;ambulating;regular height toilet Pt Will Perform Toileting - Clothing Manipulation and hygiene: with modified independence;sit to/from stand;sitting/lateral leans  Plan         AM-PAC OT 6 Clicks Daily Activity     Outcome Measure   Help from another person eating meals?: None Help from another person taking care of personal grooming?: A Little Help from another person toileting, which includes using toliet, bedpan, or urinal?: A Little Help from another person bathing (including washing, rinsing, drying)?: A Little Help from another person to put on and taking off regular upper body clothing?: A Little Help from another person to put on and taking off regular lower body clothing?: A Little 6 Click Score: 19    End of Session Equipment Utilized During Treatment: Gait belt;Rolling walker (2 wheels)  OT Visit Diagnosis: Unsteadiness on feet (R26.81);Other abnormalities of gait and mobility (R26.89)   Activity Tolerance Patient tolerated treatment well   Patient Left in bed;with call bell/phone within reach;with bed alarm set;with nursing/sitter in room;with family/visitor present   Nurse Communication Mobility status        Time: 8498-8475 OT Time Calculation (min): 23 min  Charges:  OT General Charges $OT Visit: 1 Visit OT Treatments $Self Care/Home Management : 23-37 mins  Adrianne BROCKS, OT  Acute Rehabilitation Services Office [phone] Secure chat preferred   Adrianne GORMAN Savers 11/06/2023, 4:01 PM

## 2023-11-06 NOTE — Evaluation (Signed)
 Modified Barium Swallow Study  Patient Details  Name: NASRI BOAKYE MRN: [id number] Date of Birth: [birth year]  Today's Date: 11/06/2023  Modified Barium Swallow completed.  Full report located under Chart Review in the Imaging Section.  History of Present Illness 68 y.o. male presents to [facility] hospital on 11/04/2023 with SOB and hypoxia. Per MD H&P note, pt has also been experiencing difficulty swallowing and clearing his secretions, increased AMS, and recurrent PNA (recently hospitalized 8/12-8/14 with COPD exacerbation and possible PNA). Previous clinical swallowing evaluations (2017, 2018) were [facility]. PMH includes COPD (2L O2 baseline), lung CA s/p SBRT, HTN, HLD, schizophrenia, DM II.   Clinical Impression Pt has some reduced oral clearance especially with purees and cognitively does not follow commands for lingual hold, but his pharyngeal swallow appears to be [facility]. He has trace, transient penetration (PAS 2, considered to be normal) with liquids when consumed at a more rapid rate, but no aspiration occurs. Education was provided about different diet consistencies, and at this time, his sister would prefer to start with mechanical soft (Dys 3) diet and thin liquids.  Factors that may increase risk of adverse event in presence of aspiration Noe & Lianne 2021): Reduced cognitive function  Swallow Evaluation Recommendations Recommendations: PO diet PO Diet Recommendation: Dysphagia 3 (Mechanical soft);Thin liquids (Level 0) Liquid Administration via: Cup;Straw Medication Administration: Whole meds with liquid Supervision: Staff to assist with self-feeding Swallowing strategies  : Minimize environmental distractions;Slow rate;Small bites/sips;Check for oral residue Postural changes: Position pt fully upright for meals;Stay upright 30-60 min after meals Oral care recommendations: Oral care BID (2x/day)      Leita SAILOR., M.A. CCC-SLP Acute Rehabilitation Services Office:  [phone]  Secure chat preferred  11/06/2023,11:12 AM

## 2023-11-07 DIAGNOSIS — I10 Essential (primary) hypertension: Secondary | ICD-10-CM | POA: Diagnosis not present

## 2023-11-07 DIAGNOSIS — E1169 Type 2 diabetes mellitus with other specified complication: Secondary | ICD-10-CM | POA: Diagnosis not present

## 2023-11-07 DIAGNOSIS — J441 Chronic obstructive pulmonary disease with (acute) exacerbation: Secondary | ICD-10-CM | POA: Diagnosis not present

## 2023-11-07 DIAGNOSIS — E785 Hyperlipidemia, unspecified: Secondary | ICD-10-CM

## 2023-11-07 DIAGNOSIS — G9341 Metabolic encephalopathy: Secondary | ICD-10-CM | POA: Diagnosis not present

## 2023-11-07 MED ORDER — PREDNISONE 10 MG PO TABS
20.0000 mg | ORAL_TABLET | Freq: Every day | ORAL | Status: DC
  Administered 2023-11-08: 20 mg via ORAL
  Filled 2023-11-07: qty 2

## 2023-11-07 MED ORDER — ALBUTEROL SULFATE (2.5 MG/3ML) 0.083% IN NEBU
2.5000 mg | INHALATION_SOLUTION | RESPIRATORY_TRACT | Status: DC
  Administered 2023-11-07 – 2023-11-08 (×5): 2.5 mg via RESPIRATORY_TRACT
  Filled 2023-11-07 (×6): qty 3

## 2023-11-07 MED ORDER — ALBUTEROL SULFATE (2.5 MG/3ML) 0.083% IN NEBU
2.5000 mg | INHALATION_SOLUTION | Freq: Four times a day (QID) | RESPIRATORY_TRACT | Status: DC
  Administered 2023-11-07: 2.5 mg via RESPIRATORY_TRACT
  Filled 2023-11-07: qty 3

## 2023-11-07 NOTE — Plan of Care (Signed)
?  Problem: Clinical Measurements: ?Goal: Respiratory complications will improve ?Outcome: Progressing ?  ?Problem: Activity: ?Goal: Risk for activity intolerance will decrease ?Outcome: Progressing ?  ?Problem: Nutrition: ?Goal: Adequate nutrition will be maintained ?Outcome: Progressing ?  ?Problem: Coping: ?Goal: Level of anxiety will decrease ?Outcome: Progressing ?  ?

## 2023-11-07 NOTE — Progress Notes (Signed)
 Progress Note   Patient: Blake Mcknight FMW:[id number] DOB: January 15, 1956 DOA: 11/04/2023     3 DOS: the patient was seen and examined on 11/07/2023   Brief hospital course: [name] was admitted to the hospital with the working diagnosis of COPD exacerbation.   68 yo male with the past medical history of COPD, lung cancer, hypertension, hyperlipidemia, schizophrenia and history of pulmonary embolism who presented with dyspnea.  Recent hospitalization 08/12 to 10/29/23 for COPD exacerbation, complicated with pneumonia. He was discharge with instructions to continue antibiotic therapy with cephalexin , doxycycline  and medrol  dose pack for steroids.  Apparently he was recovering well until 24 hrs prior to admission, that he developed confusion and somnolence.  EMS was called and he was found with 02 saturation of 60% on room air, he was placed on Cpap, given nebulized bronchodilator, methylprednisolone , epinephrine  and magnesium , then transported to the ED.  In the ED his blood pressure was 114/79, HR 103, RR 19 and 02 saturation 99% on 3 L/min per [place]  Lungs with expiratory wheezing, no rhonchi, heart with S1 and S2 present and regular, abdomen with no distention, no lower extremity edema.   ABG 7.27/ 62/ 387/ 29.7/ 100 Na 136 K 4.3 CL 97 bicarbonate 27 glucose 165 bun 9 cr 0,74  High sensitive troponin 6  Lactic acid 2,6  Wbc 10.5 hgb 12.0 plt 390   Chest radiograph with hyperinflation and diffuse bilateral bullae and emphysematous changes   EKG 123 bpm, normal axis, normal intervals, sinus rhythm with no significant ST segment or T wave changes.   Patient was placed on supplemental 02, bronchodilator therapy and steroids.  Swallow evaluation.  08/22 symptoms have improved, and diet has been advanced.  Spoke with his sister, and she agrees in palliative care at the time of discharge.  08/23 diet advanced to dysphagia 3, if good toleration and further clinical improvement, plan for  discharge tomorrow with outpatient palliative care.   Assessment and Plan: * COPD with acute exacerbation (HCC) Severe emphysema. 02 saturation today is 90% on 2 L/min per [place]   Resume LABA, ICS and LAMA Continue systemic corticosteroids with methylprednisolone  40 mg IV daily.  Airway clearing techniques with flutter valve and incentive spirometer.  Oxymetry monitoring and supplemental 02 per  to keep 02 saturation 88% or greater.  Continue aspiration precautions  Patient with very poor prognosis due to sever emphysematous changes, his sister agrees in palliative care as outpatient to prevent re hospitalizations and increased quality of life.   Acute metabolic encephalopathy Clinically has resolved.  Discontinue haldol  for now   HTN (hypertension) Continue blood pressure monitoring  Resume carvedilol  for blood pressure control.   Type 2 diabetes mellitus with hyperlipidemia (HCC) Continue glucose cover and monitoring with insulin  sliding scale  Continue statin therapy.   History of pulmonary embolism Continue anticoagulation with apixaban   BPH (benign prostatic hyperplasia) No signs of urinary retention  Resume flomax    Schizophrenia (HCC) Continue with Risperdal , cogenting and mirtazapine . As needed hydroxyzine   Malignant neoplasm of lower lobe of right lung (HCC) Sp radiation therapy, need outpatient follow up with pulmonary       Subjective: Patient is feeling well, dyspnea is improving and tolerating well dysphagia diet. No chest pain, nausea or vomiting.   Physical Exam: Vitals:   11/06/23 2238 11/07/23 0252 11/07/23 0727 11/07/23 0742  BP:   (!) 150/85   Pulse:   67 67  Resp:    16  Temp:   98 F (36.7  C)   TempSrc:      SpO2: 99% 99% 100% 100%  Weight:      Height:       Neurology awake and alert. Cognitive impairment at baseline ENT with mild pallor Cardiovascular with S1 and S2 present and regular with no gallops, rubs or murmurs Respiratory with  distant breath sounds, scattered rhonchi with no wheezing or rales Abdomen with no distention, soft and non tender No lower extremity edema   Data Reviewed:    Family Communication: I spoke with patient's borther at the bedside, we talked in detail about patient's condition, plan of care and prognosis and all questions were addressed.   Disposition: Status is: Inpatient Remains inpatient appropriate because: recovering from COPD exacerbation   Planned Discharge Destination: Home    Author: Elidia Toribio Furnace, MD 11/07/2023 8:30 AM  For on call review [email or web address].

## 2023-11-07 NOTE — Plan of Care (Signed)
   Problem: Activity: Goal: Risk for activity intolerance will decrease Outcome: Progressing   Problem: Nutrition: Goal: Adequate nutrition will be maintained Outcome: Progressing

## 2023-11-07 NOTE — Progress Notes (Signed)
 Mobility Specialist Progress Note:    11/07/23 1209  Mobility  Activity Ambulated with assistance (In room)  Level of Assistance Contact guard assist, steadying assist  Assistive Device None  Distance Ambulated (ft) 10 ft  Activity Response Tolerated well  Mobility Referral Yes  Mobility visit 1 Mobility  Mobility Specialist Start Time (ACUTE ONLY) 1156  Mobility Specialist Stop Time (ACUTE ONLY) 1209  Mobility Specialist Time Calculation (min) (ACUTE ONLY) 13 min   Received pt in bed and agreeable to mobility. Found pt on 2 L/min. No c/o. Returned to bed with alarm on. Pt ambulated on 2 L/min and SPO2 stayed >93%. Left pt on 2 L/min. Personal belongings and call light within reach. All needs met.  Lavanda Pollack Mobility Specialist  Please contact via [employer] or  Rehab Office [phone]

## 2023-11-08 DIAGNOSIS — J441 Chronic obstructive pulmonary disease with (acute) exacerbation: Secondary | ICD-10-CM | POA: Diagnosis not present

## 2023-11-08 DIAGNOSIS — E1169 Type 2 diabetes mellitus with other specified complication: Secondary | ICD-10-CM | POA: Diagnosis not present

## 2023-11-08 DIAGNOSIS — I10 Essential (primary) hypertension: Secondary | ICD-10-CM | POA: Diagnosis not present

## 2023-11-08 DIAGNOSIS — G9341 Metabolic encephalopathy: Secondary | ICD-10-CM | POA: Diagnosis not present

## 2023-11-08 MED ORDER — PREDNISONE 20 MG PO TABS: 20.0000 mg | ORAL_TABLET | Freq: Every day | ORAL | 0 refills | Status: DC

## 2023-11-08 NOTE — TOC Transition Note (Signed)
 Transition of Care [facility]) - Discharge Note   Patient Details  Name: Blake Mcknight MRN: [id number] Date of Birth: 06-10-1955  Transition of [facility]) CM/SW Contact:  Marval Gell, RN Phone Number: 11/08/2023, 1:33 PM   Clinical Narrative:     Beatris w patient at bedside and sister over the phone.  Sister will transport home and bring in oxygen .  Patient has needed DME at home.  He is active w [facility] and I have placed resumption orders and notified agency.  Discussed OP Palliative and patient sister is agreeable. [facility] notified of new referral.       Barriers to Discharge: No Barriers Identified   Patient Goals and CMS Choice Patient states their goals for this hospitalization and ongoing recovery are:: to go home CMS Medicare.gov Compare Post Acute Care list provided to:: Other (Comment Required) Choice offered to / list presented to : Sibling      Discharge Placement                       Discharge Plan and Services Additional resources added to the After Visit Summary for     Discharge Planning Services: CM Consult Post Acute Care Choice: Home Health                      [facility] Agency: [facility] Date [facility] Agency Contacted: 11/08/23 Time HH Agency Contacted: 1333 Representative spoke with at [facility] Agency: [place]  Social Drivers of Health (SDOH) Interventions SDOH Screenings   Food Insecurity: Patient Unable To Answer (11/04/2023)  Recent Concern: Food Insecurity - Food Insecurity Present (08/15/2023)  Housing: Unknown (11/04/2023)  Transportation Needs: Patient Unable To Answer (11/04/2023)  Utilities: Patient Unable To Answer (11/04/2023)  Recent Concern: Utilities - At Risk (08/15/2023)  Social Connections: Unknown (11/04/2023)  Recent Concern: Social Connections - Socially Isolated (10/27/2023)  Tobacco Use: Medium Risk (11/04/2023)     Readmission Risk Interventions    10/12/2023    3:23 PM 04/17/2023   11:09 AM  Readmission Risk  Prevention Plan  Transportation Screening Complete Complete  PCP or Specialist Appt within 5-7 Days  Complete  Home Care Screening  Complete  Medication Review (RN CM)  Complete  Medication Review [employer]) Complete   Palliative Care Screening Not Applicable   Skilled Nursing Facility Not Applicable

## 2023-11-08 NOTE — Progress Notes (Signed)
 IV removed.  Discharge edu and instructions given to caregiver who verbalized understanding.  Patient wheeled off the unit for home.

## 2023-11-08 NOTE — Plan of Care (Signed)

## 2023-11-08 NOTE — Discharge Summary (Signed)
 Physician Discharge Summary   Patient: Blake Mcknight MRN: [id number] DOB: 1955/11/24  Admit date:     11/04/2023  Discharge date: 11/08/23  Discharge Physician: Elidia Sieving Madison Direnzo   PCP: [name], MD   Recommendations at discharge:    Patient will continue prednisone  for 3 more days, continue bronchodilator therapy, ICS, LAMA and LABA. Poor prognosis due to severe COPD, follow up with palliative care as outpatient. Continue dysphagia 3 diet with aspiration precautions Follow up with Dr Hillman in 7 to 10 days,   I spoke with patient's causing at the bedside, we talked in detail about patient's condition, plan of care and prognosis and all questions were addressed.   Discharge Diagnoses: Principal Problem:   COPD with acute exacerbation (HCC) Active Problems:   Acute metabolic encephalopathy   HTN (hypertension)   Type 2 diabetes mellitus with hyperlipidemia (HCC)   History of pulmonary embolism   BPH (benign prostatic hyperplasia)   Schizophrenia (HCC)   Malignant neoplasm of lower lobe of right lung (HCC)  Resolved Problems:   * No resolved hospital problems. Blake Mcknight was admitted to the hospital with the working diagnosis of COPD exacerbation.   68 yo male with the past medical history of COPD, lung cancer, hypertension, hyperlipidemia, schizophrenia and history of pulmonary embolism who presented with dyspnea.  Recent hospitalization 08/12 to 10/29/23 for COPD exacerbation, complicated with pneumonia. He was discharge with instructions to continue antibiotic therapy with cephalexin , doxycycline  and medrol  dose pack for steroids.  Apparently he was recovering well until 24 hrs prior to admission, that he developed confusion and somnolence.  EMS was called and he was found with 02 saturation of 60% on room air, he was placed on Cpap, given nebulized bronchodilator, methylprednisolone , epinephrine  and magnesium , then transported to  the ED.  In the ED his blood pressure was 114/79, HR 103, RR 19 and 02 saturation 99% on 3 L/min per [facility]  Lungs with expiratory wheezing, no rhonchi, heart with S1 and S2 present and regular, abdomen with no distention, no lower extremity edema.   ABG 7.27/ 62/ 387/ 29.7/ 100 Na 136 K 4.3 CL 97 bicarbonate 27 glucose 165 bun 9 cr 0,74  High sensitive troponin 6  Lactic acid 2,6  Wbc 10.5 hgb 12.0 plt 390   Chest radiograph with hyperinflation and diffuse bilateral bullae and emphysematous changes   EKG 123 bpm, normal axis, normal intervals, sinus rhythm with no significant ST segment or T wave changes.   Patient was placed on supplemental 02, bronchodilator therapy and steroids.  Swallow evaluation.  08/22 symptoms have improved, and diet has been advanced.  Spoke with his sister, and she agrees in palliative care at the time of discharge.  08/23 diet advanced to dysphagia 3, if good toleration and further clinical improvement, plan for discharge tomorrow with outpatient palliative care.  08/24 patient clinically improving, plan for discharge home and follow up as outpatient with palliative care, very poor prognosis due to advanced emphysema.   Assessment and Plan: * COPD with acute exacerbation (HCC) Severe emphysema. Acute on chronic hypoxemic and hypercapnic respiratory failure  Today 02 saturation today is 99% on 2 L/min per [place]   Patient was placed on aggressive bronchodilator therapy, systemic steroids and airway clearing techniques with flutter valve and incentive spirometer.  Continue with LABA, ICS and LAMA   Continue aspiration precautions.  Patient with very poor prognosis due to sever emphysematous changes, his sister agrees in palliative care as  outpatient to prevent re hospitalizations and increased quality of life.   Supplemental 02 per [place] to keep 02 saturation 88% or greater.   Acute metabolic encephalopathy Clinically has resolved.  Mentation back to his baseline.    HTN (hypertension) Continue with carvedilol  for blood pressure control.   Type 2 diabetes mellitus with hyperlipidemia (HCC) Patient was placed on insulin  siding scale for  glucose cover and monitoring. His glucose remained stable during his hospitalization.  Resume Januvia at the time of discharge Continue statin therapy.   History of pulmonary embolism Continue anticoagulation with apixaban   BPH (benign prostatic hyperplasia) No signs of urinary retention  On flomax    Schizophrenia (HCC) Continue with Risperdal , cogenting and mirtazapine . As needed hydroxyzine   Malignant neoplasm of lower lobe of right lung (HCC) Sp radiation therapy, need outpatient follow up with pulmonary          Consultants: none  Procedures performed: none   Disposition: Home Diet recommendation:  Dysphagia type 3 thin Liquid DISCHARGE MEDICATION: Allergies as of 11/08/2023   No Known Allergies      Medication List     STOP taking these medications    doxycycline  100 MG tablet Commonly known as: VIBRA -TABS   methylPREDNISolone  4 MG Tbpk tablet Commonly known as: MEDROL  DOSEPAK       TAKE these medications    acetaminophen  500 MG tablet Commonly known as: TYLENOL  Take 2 tablets (1,000 mg total) by mouth every 8 (eight) hours. What changed:  when to take this reasons to take this   albuterol  108 (90 Base) MCG/ACT inhaler Commonly known as: VENTOLIN  HFA Inhale 2 puffs into the lungs every 4 (four) hours as needed for wheezing or shortness of breath.   apixaban  5 MG Tabs tablet Commonly known as: ELIQUIS  Take 1 tablet (5 mg total) by mouth 2 (two) times daily.   arformoterol  15 MCG/2ML Nebu Commonly known as: BROVANA  Take 2 mLs (15 mcg total) by nebulization 2 (two) times daily.   atorvastatin  10 MG tablet Commonly known as: LIPITOR Take 1 tablet (10 mg total) by mouth at bedtime.   benztropine  2 MG tablet Commonly known as: COGENTIN  Take 1 tablet (2 mg total) by  mouth 2 (two) times daily.   carvedilol  3.125 MG tablet Commonly known as: COREG  Take 2 tablets (6.25 mg total) by mouth 2 (two) times daily with a meal.   cetirizine 10 MG tablet Commonly known as: ZYRTEC Take 10 mg by mouth daily as needed for allergies or rhinitis.   cholecalciferol 25 MCG (1000 UNIT) tablet Commonly known as: VITAMIN D3 Take 5,000 Units by mouth every morning.   cyanocobalamin  1000 MCG tablet Take 1,000 mcg by mouth daily.   famotidine  40 MG tablet Commonly known as: PEPCID  Take 40 mg by mouth 2 (two) times daily.   feeding supplement (GLUCERNA SHAKE) Liqd Take 237 mLs by mouth 3 (three) times daily between meals.   hydrOXYzine  25 MG tablet Commonly known as: ATARAX  Take 1 tablet (25 mg total) by mouth 3 (three) times daily as needed for anxiety.   ipratropium-albuterol  0.5-2.5 (3) MG/3ML Soln Commonly known as: DUONEB Take 3 mLs by nebulization every 4 (four) hours. What changed:  when to take this reasons to take this   Januvia 100 MG tablet Generic drug: sitaGLIPtin Take 100 mg by mouth every evening.   lidocaine  5 % Commonly known as: LIDODERM  Place 1 patch onto the skin daily. Remove & Discard patch within 12 hours or as directed by MD  methocarbamol  750 MG tablet Commonly known as: ROBAXIN  Take 1 tablet (750 mg total) by mouth every 8 (eight) hours as needed for muscle spasms.   mirtazapine  30 MG tablet Commonly known as: REMERON  Take 1 tablet (30 mg total) by mouth at bedtime as needed (insomnia).   multivitamin Tabs tablet Take 1 tablet by mouth daily.   OXYGEN  Inhale 2 L/min into the lungs continuous.   polyethylene glycol 17 g packet Commonly known as: MIRALAX  / GLYCOLAX  Take 17 g by mouth daily.   predniSONE  20 MG tablet Commonly known as: DELTASONE  Take 1 tablet (20 mg total) by mouth daily with breakfast. Start taking on: November 09, 2023   risperiDONE  3 MG tablet Commonly known as: RISPERDAL  Take 1 tablet (3 mg  total) by mouth 2 (two) times daily.   sodium chloride  0.65 % Soln nasal spray Commonly known as: OCEAN Place 1 spray into both nostrils as needed for congestion.   tamsulosin  0.4 MG Caps capsule Commonly known as: FLOMAX  Take 0.4 mg by mouth in the morning and at bedtime.   traMADol  50 MG tablet Commonly known as: ULTRAM  Take 1 tablet (50 mg total) by mouth every 6 (six) hours as needed.   Trelegy Ellipta  100-62.5-25 MCG/ACT Aepb Generic drug: Fluticasone -Umeclidin-Vilant Inhale 1 puff into the lungs daily.        Discharge Exam: Filed Weights   11/04/23 1602 11/05/23 0404 11/06/23 0344  Weight: 59.9 kg 59.5 kg 59.3 kg   BP (!) 154/93   Pulse 73   Temp 98 F (36.7 C)   Resp 16   Ht 5' 6 (1.676 m)   Wt 59.3 kg   SpO2 99%   BMI 21.10 kg/m   Patient with improvement in dyspnea, no chest pain, no nausea or vomiting. Tolerating well dysphagia diet.  Neurology awake and alert ENT with mild pallor with no icterus Cardiovascular with S1 and S2 present and regular with no gallops, rubs or murmurs Respiratory with distant breath sounds with no rhonchi or wheezing Abdomen with no distention  No lower extremity edema   Condition at discharge: stable  The results of significant diagnostics from this hospitalization (including imaging, microbiology, ancillary and laboratory) are listed below for reference.   Imaging Studies: DG Swallowing Func-Speech Pathology Result Date: 11/06/2023 Table formatting from the original result was not included. Modified Barium Swallow Study Patient Details Name: Blake Mcknight MRN: [id number] Date of Birth: [birth year] Today's Date: 11/06/2023 HPI/PMH: HPI: 68 y.o. male presents to [facility] hospital on 11/04/2023 with SOB and hypoxia. Per MD H&P note, pt has also been experiencing difficulty swallowing and clearing his secretions, increased AMS, and recurrent PNA (recently hospitalized 8/12-8/14 with COPD exacerbation and possible PNA). Previous  clinical swallowing evaluations (2017, 2018) were [facility]. PMH includes COPD (2L O2 baseline), lung CA s/p SBRT, HTN, HLD, schizophrenia, DM II. Clinical Impression: Clinical Impression: Pt has some reduced oral clearance especially with purees and cognitively does not follow commands for lingual hold, but his pharyngeal swallow appears to be [facility]. He has trace, transient penetration (PAS 2, considered to be normal) with liquids when consumed at a more rapid rate, but no aspiration occurs. Education was provided about different diet consistencies, and at this time, his sister would prefer to start with mechanical soft (Dys 3) diet and thin liquids. Factors that may increase risk of adverse event in presence of aspiration Noe & Lianne 2021): Factors that may increase risk of adverse event in presence of aspiration Noe & Lianne 2021):  Reduced cognitive function Recommendations/Plan: Swallowing Evaluation Recommendations Swallowing Evaluation Recommendations Recommendations: PO diet PO Diet Recommendation: Dysphagia 3 (Mechanical soft); Thin liquids (Level 0) Liquid Administration via: Cup; Straw Medication Administration: Whole meds with liquid Supervision: Staff to assist with self-feeding Swallowing strategies  : Minimize environmental distractions; Slow rate; Small bites/sips; Check for oral residue Postural changes: Position pt fully upright for meals; Stay upright 30-60 min after meals Oral care recommendations: Oral care BID (2x/day) Treatment Plan Treatment Plan Treatment recommendations: Therapy as outlined in treatment plan below Follow-up recommendations: No SLP follow up Functional status assessment: Patient has had a recent decline in their functional status and demonstrates the ability to make significant improvements in function in a reasonable and predictable amount of time. Treatment frequency: Min 2x/week Treatment duration: 1 week Interventions: Aspiration precaution training; Patient/family  education; Trials of upgraded texture/liquids; Diet toleration management by SLP Recommendations Recommendations for follow up therapy are one component of a multi-disciplinary discharge planning process, led by the attending physician.  Recommendations may be updated based on patient status, additional functional criteria and insurance authorization. Assessment: Orofacial Exam: Orofacial Exam Oral Cavity - Dentition: Adequate natural dentition; Missing dentition Anatomy: Anatomy: WFL Boluses Administered: Boluses Administered Boluses Administered: Thin liquids (Level 0); Mildly thick liquids (Level 2, nectar thick); Puree; Solid  Oral Impairment Domain: Oral Impairment Domain Lip Closure: No labial escape Tongue control during bolus hold: Posterior escape of greater than half of bolus (due to cognition) Bolus preparation/mastication: Timely and efficient chewing and mashing Bolus transport/lingual motion: Brisk tongue motion Oral residue: Residue collection on oral structures Location of oral residue : Tongue Initiation of pharyngeal swallow : Valleculae  Pharyngeal Impairment Domain: Pharyngeal Impairment Domain Soft palate elevation: No bolus between soft palate (SP)/pharyngeal wall (PW) Laryngeal elevation: Complete superior movement of thyroid cartilage with complete approximation of arytenoids to epiglottic petiole Anterior hyoid excursion: Complete anterior movement Epiglottic movement: Complete inversion Laryngeal vestibule closure: Complete, no air/contrast in laryngeal vestibule Pharyngeal stripping wave : Present - complete Pharyngeal contraction (A/P view only): N/A Pharyngoesophageal segment opening: Complete distension and complete duration, no obstruction of flow Tongue base retraction: No contrast between tongue base and posterior pharyngeal wall (PPW) Pharyngeal residue: Complete pharyngeal clearance Location of pharyngeal residue: N/A  Esophageal Impairment Domain: Esophageal Impairment Domain  Esophageal clearance upright position: Esophageal retention Pill: Pill Consistency administered: Thin liquids (Level 0) Thin liquids (Level 0): [facility] Penetration/Aspiration Scale Score: Penetration/Aspiration Scale Score 1.  Material does not enter airway: Puree; Solid; Pill 2.  Material enters airway, remains ABOVE vocal cords then ejected out: Mildly thick liquids (Level 2, nectar thick); Thin liquids (Level 0) Compensatory Strategies: Compensatory Strategies Compensatory strategies: No   General Information: Caregiver present: Yes (sister)  Diet Prior to this Study: NPO   Temperature : Normal   Respiratory Status: WFL   Supplemental O2: Nasal cannula   History of Recent Intubation: No  Behavior/Cognition: Alert; Cooperative; Pleasant mood; Requires cueing Self-Feeding Abilities: Able to self-feed Baseline vocal quality/speech: Normal No data recorded Volitional Swallow: Unable to elicit Exam Limitations: No limitations Goal Planning: Prognosis for improved oropharyngeal function: Good Barriers to Reach Goals: Cognitive deficits No data recorded Patient/Family Stated Goal: asking for food and drink Consulted and agree with results and recommendations: Patient; Family member/caregiver Pain: Pain Assessment Pain Assessment: No/denies pain Faces Pain Scale: 0 Breathing: 0 Negative Vocalization: 0 Facial Expression: 0 Body Language: 0 Consolability: 0 PAINAD Score: 0 Pain Intervention(s): Monitored during session End of Session: Start Time:SLP Start Time (ACUTE ONLY):  1036 Stop Time: SLP Stop Time (ACUTE ONLY): 1052 Time Calculation:SLP Time Calculation (min) (ACUTE ONLY): 16 min Charges: SLP Evaluations $ SLP Speech Visit: 1 Visit SLP Evaluations $BSS Swallow: 1 Procedure $MBS Swallow: 1 Procedure $Swallowing Treatment: 1 Procedure SLP visit diagnosis: SLP Visit Diagnosis: Dysphagia, unspecified (R13.10) Past Medical History: Past Medical History: Diagnosis Date  Abdominal pain 06/03/2013  Chest pain 08/10/2023  COPD  (chronic obstructive pulmonary disease) (HCC)   with bullous emphysema.   COPD exacerbation (HCC) 08/09/2023  Heavy cigarette smoker before 2003  pt claims only 10 cigs per day, never heavier amounts.   HLD (hyperlipidemia)   Hypertension   Schizophrenia (HCC) 06/28/2013  This is a chronic condition and he lives with family Past Surgical History: Past Surgical History: Procedure Laterality Date  CHOLECYSTECTOMY N/A 06/06/2013  Procedure: LAPAROSCOPIC CHOLECYSTECTOMY WITH ATTEMPTED INTRAOPERATIVE CHOLANGIOGRAM;  Surgeon: Dann FORBES Hummer, MD;  Location: [facility] OR;  Service: General;  Laterality: N/A;  ERCP N/A 06/03/2013  Procedure: ENDOSCOPIC RETROGRADE CHOLANGIOPANCREATOGRAPHY (ERCP);  Surgeon: Gwendlyn ONEIDA Buddy, MD;  Location: [facility] ENDOSCOPY;  Service: Endoscopy;  Laterality: N/A; Leita SAILOR., M.A. CCC-SLP Acute Rehabilitation Services Office: [phone] Secure chat preferred 11/06/2023, 11:13 AM  DG Chest Portable 1 View Result Date: 11/04/2023 EXAM: 1 VIEW XRAY OF THE CHEST 11/04/2023 06:24:00 AM COMPARISON: AP radiograph of the chest dated 10/28/2023. CLINICAL HISTORY: SOB. Respiratory distress/ SOB. Hx COPD. FINDINGS: LUNGS AND PLEURA: Extensive bullous emphysema present with irregular reticular scarring, similar to the prior exam. No focal pulmonary opacity. No pulmonary edema. No pleural effusion. No pneumothorax. HEART AND MEDIASTINUM: No acute abnormality of the cardiac and mediastinal silhouettes. BONES AND SOFT TISSUES: No acute osseous abnormality. IMPRESSION: 1. Extensive bullous emphysema with irregular reticular scarring, similar to the prior exam. Electronically signed by: Evalene Coho MD 11/04/2023 06:59 AM EDT RP Workstation: [id number]   DG Chest Port 1 View Result Date: 10/28/2023 CLINICAL DATA:  Shortness of breath. EXAM: PORTABLE CHEST 1 VIEW COMPARISON:  Radiograph yesterday., CT 09/30/2022 FINDINGS: Advanced emphysema. Medial lung base opacity is unchanged from yesterday's exam. Minimal  opacity in the medial left lung base, unchanged. Stable heart size and mediastinal contours. Chronic blunting of the costophrenic angles. No pneumothorax. IMPRESSION: Advanced emphysema. Medial lung base opacity is unchanged from yesterday's exam, corresponding to masslike opacity on prior CT. Unchanged medial left lung base opacity. Electronically Signed   By: Andrea Gasman M.D.   On: 10/28/2023 13:27   DG Chest Port 1 View Result Date: 10/27/2023 CLINICAL DATA:  Shortness of breath EXAM: PORTABLE CHEST 1 VIEW COMPARISON:  10/11/2023 FINDINGS: Cardiac shadow is stable. Aortic calcifications are seen. Diffuse emphysematous changes with multiple blebs are seen. The overall appearance is stable from the prior exam. Some increased patchy density is noted in the left base medially as well as in the right base. No sizable effusion is seen. No bony abnormality is noted. IMPRESSION: Persistent basilar opacity on the right when compared with the prior exam. New left medial basilar opacity. Electronically Signed   By: Oneil Devonshire M.D.   On: 10/27/2023 03:29   DG CHEST PORT 1 VIEW Result Date: 10/11/2023 CLINICAL DATA:  Shortness of breath. EXAM: PORTABLE CHEST 1 VIEW COMPARISON:  Chest CT dated 10/10/2023. FINDINGS: Background of emphysema. Right infrahilar opacity similar or slightly improved. No pleural effusion or pneumothorax. Stable cardiac silhouette. No acute osseous pathology. IMPRESSION: Right infrahilar opacity similar or slightly improved. Electronically Signed   By: Vanetta Chou M.D.   On: 10/11/2023 10:49  DG CHEST PORT 1 VIEW Result Date: 10/10/2023 CLINICAL DATA:  [id number] SOB (shortness of breath) [id number] EXAM: PORTABLE CHEST - 1 VIEW COMPARISON:  10/08/2023 FINDINGS: Stable changes of bullous emphysema. No new infiltrate or overt edema. Heart size and mediastinal contours are within normal limits. No effusion. Visualized bones unremarkable. IMPRESSION: Bullous emphysema. No acute findings.  Electronically Signed   By: JONETTA Faes M.D.   On: 10/10/2023 07:57    Microbiology: Results for orders placed or performed during the hospital encounter of 11/04/23  Resp panel by RT-PCR (RSV, Flu A&B, Covid) Anterior Nasal Swab     Status: None   Collection Time: 11/04/23  4:35 PM   Specimen: Anterior Nasal Swab  Result Value Ref Range Status   SARS Coronavirus 2 by RT PCR NEGATIVE NEGATIVE Final   Influenza A by PCR NEGATIVE NEGATIVE Final   Influenza B by PCR NEGATIVE NEGATIVE Final    Comment: (NOTE) The Xpert Xpress SARS-CoV-2/FLU/RSV plus assay is intended as an aid in the diagnosis of influenza from Nasopharyngeal swab specimens and should not be used as a sole basis for treatment. Nasal washings and aspirates are unacceptable for Xpert Xpress SARS-CoV-2/FLU/RSV testing.  Fact Sheet for Patients: BloggerCourse.com  Fact Sheet for Healthcare Providers: SeriousBroker.it  This test is not yet approved or cleared by the [place]  FDA and has been authorized for detection and/or diagnosis of SARS-CoV-2 by FDA under an Emergency Use Authorization (EUA). This EUA will remain in effect (meaning this test can be used) for the duration of the COVID-19 declaration under Section 564(b)(1) of the Act, 21 U.S.C. section 360bbb-3(b)(1), unless the authorization is terminated or revoked.     Resp Syncytial Virus by PCR NEGATIVE NEGATIVE Final    Comment: (NOTE) Fact Sheet for Patients: BloggerCourse.com  Fact Sheet for Healthcare Providers: SeriousBroker.it  This test is not yet approved or cleared by the [place]  FDA and has been authorized for detection and/or diagnosis of SARS-CoV-2 by FDA under an Emergency Use Authorization (EUA). This EUA will remain in effect (meaning this test can be used) for the duration of the COVID-19 declaration under Section 564(b)(1) of the  Act, 21 U.S.C. section 360bbb-3(b)(1), unless the authorization is terminated or revoked.  Performed at [facility], [address], [place], [place] [address]     Labs: CBC: Recent Labs  Lab 11/04/23 [phone] 11/04/23 [phone] 11/04/23 0947 11/04/23 1104 11/05/23 0302 11/06/23 0218  WBC 10.5  --   --   --  5.5 10.5  NEUTROABS 4.4  --   --   --   --   --   HGB 12.0* 11.9* 11.6* 9.9* 11.6* 10.9*  HCT 37.9* 35.0* 34.0* 29.0* 34.9* 33.0*  MCV 85.0  --   --   --  81.9 81.5  PLT 390  --   --   --  368 340   Basic Metabolic Panel: Recent Labs  Lab 11/04/23 0610 11/04/23 0613 11/04/23 0947 11/04/23 1104 11/05/23 0302 11/05/23 1729 11/06/23 0218  NA 136 134* 133* 140 134*  --  139  K 4.3 4.1 4.8 3.8 4.1  --  4.0  CL 97*  --   --   --  97*  --  100  CO2 27  --   --   --  26  --  26  GLUCOSE 165*  --   --   --  195*  --  155*  BUN 9  --   --   --  9  --  7*  CREATININE 0.74  --   --   --  0.79  --  0.63  CALCIUM  8.4*  --   --   --  9.0  --  8.8*  MG  --   --   --   --   --  2.2  --   PHOS  --   --   --   --   --  2.6  --    Liver Function Tests: No results for input(s): AST, ALT, ALKPHOS, BILITOT, PROT, ALBUMIN in the last 168 hours. CBG: No results for input(s): GLUCAP in the last 168 hours.  Discharge time spent: greater than 30 minutes.  Signed: Elidia Toribio Furnace, MD [facility] 11/08/2023

## 2023-11-08 NOTE — Progress Notes (Signed)
 Blake Mcknight [phone] [facility]?Hospital Liaison Note:??    ???    Notified by Marval SINNING manager of patient/family request for [facility]iative services at home after discharge.????????    ????    Hospital liaison will follow patient for discharge disposition.?    ????    Please call with any hospice or outpatient palliative care related questions.???    ????    Thank you for the opportunity to participate in this patient's care.    Nat Babe, BSN, RN [employer] [phone]

## 2023-11-08 NOTE — Progress Notes (Signed)
 Mobility Specialist Progress Note:    11/08/23 1224  Mobility  Activity Ambulated with assistance (In room/ to hallway)  Level of Assistance Contact guard assist, steadying assist  Assistive Device None  Distance Ambulated (ft) 12 ft  Activity Response Tolerated well  Mobility Referral Yes  Mobility visit 1 Mobility  Mobility Specialist Start Time (ACUTE ONLY) 1201  Mobility Specialist Stop Time (ACUTE ONLY) 1222  Mobility Specialist Time Calculation (min) (ACUTE ONLY) 21 min   Received pt in bed and was agreeable to mobility with strong encouragement. Found pt on 2 L/min O2. Required no physical assistance. No c/o. Pt ambulated on 2 L/min O2. Pt's SPO2 stayed >91%. Returned pt to bed and left EOB. Left pt on 2 L/min. Bed alarm on. Personal belongings and call light within reach. All needs met. Family present.  Lavanda Pollack Mobility Specialist  Please contact via [employer] or  Rehab Office [phone]

## 2023-11-08 NOTE — Plan of Care (Signed)
   Problem: Clinical Measurements: Goal: Respiratory complications will improve Outcome: Progressing

## 2023-11-12 DIAGNOSIS — Z79899 Other long term (current) drug therapy: Secondary | ICD-10-CM | POA: Diagnosis not present

## 2023-11-12 DIAGNOSIS — J9621 Acute and chronic respiratory failure with hypoxia: Secondary | ICD-10-CM | POA: Diagnosis not present

## 2023-11-12 DIAGNOSIS — N401 Enlarged prostate with lower urinary tract symptoms: Secondary | ICD-10-CM | POA: Diagnosis not present

## 2023-11-12 DIAGNOSIS — K2100 Gastro-esophageal reflux disease with esophagitis, without bleeding: Secondary | ICD-10-CM | POA: Diagnosis not present

## 2023-11-12 DIAGNOSIS — E7800 Pure hypercholesterolemia, unspecified: Secondary | ICD-10-CM | POA: Diagnosis not present

## 2023-11-12 DIAGNOSIS — M1990 Unspecified osteoarthritis, unspecified site: Secondary | ICD-10-CM | POA: Diagnosis not present

## 2023-11-12 DIAGNOSIS — I119 Hypertensive heart disease without heart failure: Secondary | ICD-10-CM | POA: Diagnosis not present

## 2023-11-12 DIAGNOSIS — E1165 Type 2 diabetes mellitus with hyperglycemia: Secondary | ICD-10-CM | POA: Diagnosis not present

## 2023-11-12 DIAGNOSIS — F319 Bipolar disorder, unspecified: Secondary | ICD-10-CM | POA: Diagnosis not present

## 2023-11-12 DIAGNOSIS — C3431 Malignant neoplasm of lower lobe, right bronchus or lung: Secondary | ICD-10-CM | POA: Diagnosis not present

## 2023-11-12 DIAGNOSIS — Z86711 Personal history of pulmonary embolism: Secondary | ICD-10-CM | POA: Diagnosis not present

## 2023-11-12 DIAGNOSIS — J441 Chronic obstructive pulmonary disease with (acute) exacerbation: Secondary | ICD-10-CM | POA: Diagnosis not present

## 2023-11-12 DIAGNOSIS — S22060A Wedge compression fracture of T7-T8 vertebra, initial encounter for closed fracture: Secondary | ICD-10-CM | POA: Diagnosis not present

## 2023-11-12 DIAGNOSIS — E559 Vitamin D deficiency, unspecified: Secondary | ICD-10-CM | POA: Diagnosis not present

## 2023-11-18 DIAGNOSIS — J441 Chronic obstructive pulmonary disease with (acute) exacerbation: Secondary | ICD-10-CM | POA: Diagnosis not present

## 2023-11-18 DIAGNOSIS — J9621 Acute and chronic respiratory failure with hypoxia: Secondary | ICD-10-CM | POA: Diagnosis not present

## 2023-11-18 DIAGNOSIS — C3431 Malignant neoplasm of lower lobe, right bronchus or lung: Secondary | ICD-10-CM | POA: Diagnosis not present

## 2023-11-18 DIAGNOSIS — E7800 Pure hypercholesterolemia, unspecified: Secondary | ICD-10-CM | POA: Diagnosis not present

## 2023-11-18 DIAGNOSIS — I119 Hypertensive heart disease without heart failure: Secondary | ICD-10-CM | POA: Diagnosis not present

## 2023-11-18 DIAGNOSIS — E1165 Type 2 diabetes mellitus with hyperglycemia: Secondary | ICD-10-CM | POA: Diagnosis not present

## 2023-11-19 ENCOUNTER — Ambulatory Visit (HOSPITAL_COMMUNITY): Admission: RE | Admit: 2023-11-19 | Source: Ambulatory Visit

## 2023-11-23 DIAGNOSIS — J9621 Acute and chronic respiratory failure with hypoxia: Secondary | ICD-10-CM | POA: Diagnosis not present

## 2023-11-23 DIAGNOSIS — J441 Chronic obstructive pulmonary disease with (acute) exacerbation: Secondary | ICD-10-CM | POA: Diagnosis not present

## 2023-11-23 DIAGNOSIS — I119 Hypertensive heart disease without heart failure: Secondary | ICD-10-CM | POA: Diagnosis not present

## 2023-11-23 DIAGNOSIS — E1165 Type 2 diabetes mellitus with hyperglycemia: Secondary | ICD-10-CM | POA: Diagnosis not present

## 2023-11-23 DIAGNOSIS — C3431 Malignant neoplasm of lower lobe, right bronchus or lung: Secondary | ICD-10-CM | POA: Diagnosis not present

## 2023-11-23 DIAGNOSIS — E7800 Pure hypercholesterolemia, unspecified: Secondary | ICD-10-CM | POA: Diagnosis not present

## 2023-11-25 DIAGNOSIS — J441 Chronic obstructive pulmonary disease with (acute) exacerbation: Secondary | ICD-10-CM | POA: Diagnosis not present

## 2023-11-25 DIAGNOSIS — C3431 Malignant neoplasm of lower lobe, right bronchus or lung: Secondary | ICD-10-CM | POA: Diagnosis not present

## 2023-11-25 DIAGNOSIS — E7800 Pure hypercholesterolemia, unspecified: Secondary | ICD-10-CM | POA: Diagnosis not present

## 2023-11-25 DIAGNOSIS — E1165 Type 2 diabetes mellitus with hyperglycemia: Secondary | ICD-10-CM | POA: Diagnosis not present

## 2023-11-25 DIAGNOSIS — J9621 Acute and chronic respiratory failure with hypoxia: Secondary | ICD-10-CM | POA: Diagnosis not present

## 2023-11-25 DIAGNOSIS — I119 Hypertensive heart disease without heart failure: Secondary | ICD-10-CM | POA: Diagnosis not present

## 2023-11-27 ENCOUNTER — Emergency Department (HOSPITAL_COMMUNITY)

## 2023-11-27 ENCOUNTER — Inpatient Hospital Stay (HOSPITAL_COMMUNITY)
Admission: EM | Admit: 2023-11-27 | Discharge: 2023-11-29 | DRG: 189 | Disposition: A | Discharge status: Home / Self Care | Attending: Internal Medicine | Admitting: Internal Medicine

## 2023-11-27 ENCOUNTER — Other Ambulatory Visit: Payer: Self-pay

## 2023-11-27 ENCOUNTER — Encounter (HOSPITAL_COMMUNITY): Payer: Self-pay

## 2023-11-27 DIAGNOSIS — J440 Chronic obstructive pulmonary disease with acute lower respiratory infection: Secondary | ICD-10-CM | POA: Diagnosis not present

## 2023-11-27 DIAGNOSIS — Z87891 Personal history of nicotine dependence: Secondary | ICD-10-CM

## 2023-11-27 DIAGNOSIS — E1169 Type 2 diabetes mellitus with other specified complication: Secondary | ICD-10-CM | POA: Diagnosis present

## 2023-11-27 DIAGNOSIS — Z85118 Personal history of other malignant neoplasm of bronchus and lung: Secondary | ICD-10-CM | POA: Diagnosis not present

## 2023-11-27 DIAGNOSIS — R069 Unspecified abnormalities of breathing: Secondary | ICD-10-CM | POA: Diagnosis not present

## 2023-11-27 DIAGNOSIS — Z86711 Personal history of pulmonary embolism: Secondary | ICD-10-CM

## 2023-11-27 DIAGNOSIS — J189 Pneumonia, unspecified organism: Secondary | ICD-10-CM | POA: Diagnosis present

## 2023-11-27 DIAGNOSIS — Z66 Do not resuscitate: Secondary | ICD-10-CM | POA: Diagnosis not present

## 2023-11-27 DIAGNOSIS — Z7984 Long term (current) use of oral hypoglycemic drugs: Secondary | ICD-10-CM | POA: Diagnosis not present

## 2023-11-27 DIAGNOSIS — Z8249 Family history of ischemic heart disease and other diseases of the circulatory system: Secondary | ICD-10-CM | POA: Diagnosis not present

## 2023-11-27 DIAGNOSIS — Z7952 Long term (current) use of systemic steroids: Secondary | ICD-10-CM | POA: Diagnosis not present

## 2023-11-27 DIAGNOSIS — J439 Emphysema, unspecified: Secondary | ICD-10-CM | POA: Diagnosis present

## 2023-11-27 DIAGNOSIS — F209 Schizophrenia, unspecified: Secondary | ICD-10-CM | POA: Diagnosis present

## 2023-11-27 DIAGNOSIS — Z833 Family history of diabetes mellitus: Secondary | ICD-10-CM | POA: Diagnosis not present

## 2023-11-27 DIAGNOSIS — F203 Undifferentiated schizophrenia: Secondary | ICD-10-CM | POA: Diagnosis not present

## 2023-11-27 DIAGNOSIS — J9621 Acute and chronic respiratory failure with hypoxia: Secondary | ICD-10-CM | POA: Diagnosis not present

## 2023-11-27 DIAGNOSIS — I10 Essential (primary) hypertension: Secondary | ICD-10-CM | POA: Diagnosis not present

## 2023-11-27 DIAGNOSIS — Z9981 Dependence on supplemental oxygen: Secondary | ICD-10-CM | POA: Diagnosis not present

## 2023-11-27 DIAGNOSIS — R918 Other nonspecific abnormal finding of lung field: Secondary | ICD-10-CM | POA: Diagnosis not present

## 2023-11-27 DIAGNOSIS — Z7951 Long term (current) use of inhaled steroids: Secondary | ICD-10-CM | POA: Diagnosis not present

## 2023-11-27 DIAGNOSIS — Z9049 Acquired absence of other specified parts of digestive tract: Secondary | ICD-10-CM

## 2023-11-27 DIAGNOSIS — E785 Hyperlipidemia, unspecified: Secondary | ICD-10-CM | POA: Diagnosis present

## 2023-11-27 DIAGNOSIS — Z7901 Long term (current) use of anticoagulants: Secondary | ICD-10-CM | POA: Diagnosis not present

## 2023-11-27 DIAGNOSIS — Z79899 Other long term (current) drug therapy: Secondary | ICD-10-CM

## 2023-11-27 DIAGNOSIS — Z79891 Long term (current) use of opiate analgesic: Secondary | ICD-10-CM | POA: Diagnosis not present

## 2023-11-27 DIAGNOSIS — J441 Chronic obstructive pulmonary disease with (acute) exacerbation: Principal | ICD-10-CM | POA: Diagnosis present

## 2023-11-27 DIAGNOSIS — N400 Enlarged prostate without lower urinary tract symptoms: Secondary | ICD-10-CM | POA: Diagnosis not present

## 2023-11-27 DIAGNOSIS — R0602 Shortness of breath: Secondary | ICD-10-CM | POA: Diagnosis not present

## 2023-11-27 DIAGNOSIS — Z923 Personal history of irradiation: Secondary | ICD-10-CM

## 2023-11-27 DIAGNOSIS — E119 Type 2 diabetes mellitus without complications: Secondary | ICD-10-CM | POA: Diagnosis present

## 2023-11-27 LAB — COMPREHENSIVE METABOLIC PANEL WITH GFR
ALT: 12 U/L (ref 0–44)
AST: 20 U/L (ref 15–41)
Albumin: 2.9 g/dL — ABNORMAL LOW (ref 3.5–5.0)
Alkaline Phosphatase: 81 U/L (ref 38–126)
Anion gap: 12 (ref 5–15)
BUN: <5 mg/dL — ABNORMAL LOW (ref 8–23)
CO2: 25 mmol/L (ref 22–32)
Calcium: 8.8 mg/dL — ABNORMAL LOW (ref 8.9–10.3)
Chloride: 98 mmol/L (ref 98–111)
Creatinine, Ser: 0.58 mg/dL — ABNORMAL LOW (ref 0.61–1.24)
GFR, Estimated: >60 mL/min (ref >60)
Glucose, Bld: 197 mg/dL — ABNORMAL HIGH (ref 70–99)
Potassium: 4.8 mmol/L (ref 3.5–5.1)
Sodium: 135 mmol/L (ref 135–145)
Total Bilirubin: 0.8 mg/dL (ref 0.0–1.2)
Total Protein: 6.9 g/dL (ref 6.5–8.1)

## 2023-11-27 LAB — I-STAT VENOUS BLOOD GAS, ED
Acid-Base Excess: 5 mmol/L — ABNORMAL HIGH (ref 0.0–2.0)
Bicarbonate: 31.3 mmol/L — ABNORMAL HIGH (ref 20.0–28.0)
Calcium, Ion: 1.18 mmol/L (ref 1.15–1.40)
HCT: 36 % — ABNORMAL LOW (ref 39.0–52.0)
Hemoglobin: 12.2 g/dL — ABNORMAL LOW (ref 13.0–17.0)
O2 Saturation: 97 %
Potassium: 4.7 mmol/L (ref 3.5–5.1)
Sodium: 134 mmol/L — ABNORMAL LOW (ref 135–145)
TCO2: 33 mmol/L — ABNORMAL HIGH (ref 22–32)
pCO2, Ven: 54.9 mmHg (ref 44–60)
pH, Ven: 7.363 (ref 7.25–7.43)
pO2, Ven: 101 mmHg — ABNORMAL HIGH (ref 32–45)

## 2023-11-27 LAB — CBC WITH DIFFERENTIAL/PLATELET
Abs Immature Granulocytes: 0.05 K/uL (ref 0.00–0.07)
Basophils Absolute: 0.1 K/uL (ref 0.0–0.1)
Basophils Relative: 1 %
Eosinophils Absolute: 0.6 K/uL — ABNORMAL HIGH (ref 0.0–0.5)
Eosinophils Relative: 4 %
HCT: 37.8 % — ABNORMAL LOW (ref 39.0–52.0)
Hemoglobin: 11.9 g/dL — ABNORMAL LOW (ref 13.0–17.0)
Immature Granulocytes: 0 %
Lymphocytes Relative: 24 %
Lymphs Abs: 3 K/uL (ref 0.7–4.0)
MCH: 26.7 pg (ref 26.0–34.0)
MCHC: 31.5 g/dL (ref 30.0–36.0)
MCV: 84.9 fL (ref 80.0–100.0)
Monocytes Absolute: 1.3 K/uL — ABNORMAL HIGH (ref 0.1–1.0)
Monocytes Relative: 10 %
Neutro Abs: 7.7 K/uL (ref 1.7–7.7)
Neutrophils Relative %: 61 %
Platelets: 345 K/uL (ref 150–400)
RBC: 4.45 MIL/uL (ref 4.22–5.81)
RDW: 16.5 % — ABNORMAL HIGH (ref 11.5–15.5)
WBC: 12.7 K/uL — ABNORMAL HIGH (ref 4.0–10.5)
nRBC: 0 % (ref 0.0–0.2)

## 2023-11-27 LAB — BRAIN NATRIURETIC PEPTIDE: B Natriuretic Peptide: 52.7 pg/mL (ref 0.0–100.0)

## 2023-11-27 LAB — PROCALCITONIN: Procalcitonin: <0.1 ng/mL

## 2023-11-27 LAB — GLUCOSE, CAPILLARY: Glucose-Capillary: 193 mg/dL — ABNORMAL HIGH (ref 70–99)

## 2023-11-27 LAB — TROPONIN I (HIGH SENSITIVITY)
Troponin I (High Sensitivity): 5 ng/L (ref <18)
Troponin I (High Sensitivity): 7 ng/L (ref <18)

## 2023-11-27 LAB — CBG MONITORING, ED: Glucose-Capillary: 142 mg/dL — ABNORMAL HIGH (ref 70–99)

## 2023-11-27 MED ORDER — SODIUM CHLORIDE 0.9 % IV SOLN
100.0000 mg | Freq: Two times a day (BID) | INTRAVENOUS | Status: DC
  Administered 2023-11-27 – 2023-11-29 (×4): 100 mg via INTRAVENOUS
  Filled 2023-11-27 (×5): qty 100

## 2023-11-27 MED ORDER — ALBUTEROL SULFATE (2.5 MG/3ML) 0.083% IN NEBU
10.0000 mg/h | INHALATION_SOLUTION | Freq: Once | RESPIRATORY_TRACT | Status: AC
  Administered 2023-11-27: 10 mg/h via RESPIRATORY_TRACT
  Filled 2023-11-27: qty 12

## 2023-11-27 MED ORDER — POLYETHYLENE GLYCOL 3350 17 G PO PACK
17.0000 g | PACK | Freq: Every day | ORAL | Status: DC
  Administered 2023-11-27 – 2023-11-29 (×3): 17 g via ORAL
  Filled 2023-11-27 (×3): qty 1

## 2023-11-27 MED ORDER — ALBUTEROL SULFATE (2.5 MG/3ML) 0.083% IN NEBU
2.5000 mg | INHALATION_SOLUTION | RESPIRATORY_TRACT | Status: DC | PRN
  Administered 2023-11-27 – 2023-11-28 (×2): 2.5 mg via RESPIRATORY_TRACT
  Filled 2023-11-27 (×2): qty 3

## 2023-11-27 MED ORDER — BENZTROPINE MESYLATE 2 MG PO TABS
2.0000 mg | ORAL_TABLET | Freq: Two times a day (BID) | ORAL | Status: DC
  Administered 2023-11-27 – 2023-11-29 (×5): 2 mg via ORAL
  Filled 2023-11-27 (×6): qty 1
  Filled 2023-11-27: qty 2

## 2023-11-27 MED ORDER — TRAMADOL HCL 50 MG PO TABS: 50.0000 mg | ORAL_TABLET | Freq: Four times a day (QID) | ORAL | Status: DC | PRN

## 2023-11-27 MED ORDER — IPRATROPIUM-ALBUTEROL 0.5-2.5 (3) MG/3ML IN SOLN
3.0000 mL | Freq: Four times a day (QID) | RESPIRATORY_TRACT | Status: DC
  Administered 2023-11-27: 3 mL via RESPIRATORY_TRACT
  Filled 2023-11-27: qty 3

## 2023-11-27 MED ORDER — RISPERIDONE 3 MG PO TABS
3.0000 mg | ORAL_TABLET | Freq: Two times a day (BID) | ORAL | Status: DC
  Administered 2023-11-27 – 2023-11-29 (×5): 3 mg via ORAL
  Filled 2023-11-27 (×7): qty 1

## 2023-11-27 MED ORDER — BUDESONIDE 0.5 MG/2ML IN SUSP: 0.5000 mg | Freq: Two times a day (BID) | RESPIRATORY_TRACT | Status: DC

## 2023-11-27 MED ORDER — ACETAMINOPHEN 650 MG RE SUPP: 650.0000 mg | Freq: Four times a day (QID) | RECTAL | Status: DC | PRN

## 2023-11-27 MED ORDER — SODIUM CHLORIDE 0.9 % IV SOLN
100.0000 mg | Freq: Once | INTRAVENOUS | Status: AC
  Administered 2023-11-27: 100 mg via INTRAVENOUS
  Filled 2023-11-27: qty 100

## 2023-11-27 MED ORDER — REVEFENACIN 175 MCG/3ML IN SOLN
175.0000 ug | Freq: Every day | RESPIRATORY_TRACT | Status: DC
Start: 2023-11-27 — End: 2023-11-29
  Administered 2023-11-27 – 2023-11-29 (×3): 175 ug via RESPIRATORY_TRACT
  Filled 2023-11-27 (×4): qty 3

## 2023-11-27 MED ORDER — ACETAMINOPHEN 325 MG PO TABS: 650.0000 mg | ORAL_TABLET | Freq: Four times a day (QID) | ORAL | Status: DC | PRN

## 2023-11-27 MED ORDER — CARVEDILOL 6.25 MG PO TABS
6.2500 mg | ORAL_TABLET | Freq: Two times a day (BID) | ORAL | Status: DC
  Administered 2023-11-28 – 2023-11-29 (×3): 6.25 mg via ORAL
  Filled 2023-11-27 (×3): qty 1

## 2023-11-27 MED ORDER — APIXABAN 5 MG PO TABS
5.0000 mg | ORAL_TABLET | Freq: Two times a day (BID) | ORAL | Status: DC
  Administered 2023-11-27 – 2023-11-29 (×5): 5 mg via ORAL
  Filled 2023-11-27 (×5): qty 1

## 2023-11-27 MED ORDER — FAMOTIDINE 20 MG PO TABS
40.0000 mg | ORAL_TABLET | Freq: Two times a day (BID) | ORAL | Status: DC
  Administered 2023-11-27 – 2023-11-29 (×5): 40 mg via ORAL
  Filled 2023-11-27 (×5): qty 2

## 2023-11-27 MED ORDER — HYDRALAZINE HCL 20 MG/ML IJ SOLN: 10.0000 mg | INTRAMUSCULAR | Status: DC | PRN

## 2023-11-27 MED ORDER — ARFORMOTEROL TARTRATE 15 MCG/2ML IN NEBU
15.0000 ug | INHALATION_SOLUTION | Freq: Two times a day (BID) | RESPIRATORY_TRACT | Status: DC
  Administered 2023-11-28 – 2023-11-29 (×3): 15 ug via RESPIRATORY_TRACT
  Filled 2023-11-27 (×3): qty 2

## 2023-11-27 MED ORDER — GUAIFENESIN ER 600 MG PO TB12
600.0000 mg | ORAL_TABLET | Freq: Two times a day (BID) | ORAL | Status: DC
  Administered 2023-11-27 – 2023-11-29 (×4): 600 mg via ORAL
  Filled 2023-11-27 (×5): qty 1

## 2023-11-27 MED ORDER — SODIUM CHLORIDE 0.9% FLUSH
3.0000 mL | Freq: Two times a day (BID) | INTRAVENOUS | Status: DC
  Administered 2023-11-27 – 2023-11-28 (×4): 3 mL via INTRAVENOUS

## 2023-11-27 MED ORDER — HYDROXYZINE HCL 25 MG PO TABS: 25.0000 mg | ORAL_TABLET | Freq: Three times a day (TID) | ORAL | Status: DC | PRN

## 2023-11-27 MED ORDER — METHYLPREDNISOLONE SODIUM SUCC 40 MG IJ SOLR
40.0000 mg | Freq: Two times a day (BID) | INTRAMUSCULAR | Status: DC
  Administered 2023-11-27 – 2023-11-28 (×4): 40 mg via INTRAVENOUS
  Filled 2023-11-27 (×5): qty 1

## 2023-11-27 MED ORDER — TAMSULOSIN HCL 0.4 MG PO CAPS
0.4000 mg | ORAL_CAPSULE | Freq: Every day | ORAL | Status: DC
  Administered 2023-11-27 – 2023-11-28 (×2): 0.4 mg via ORAL
  Filled 2023-11-27 (×2): qty 1

## 2023-11-27 MED ORDER — INSULIN ASPART 100 UNIT/ML IJ SOLN
0.0000 [IU] | Freq: Three times a day (TID) | INTRAMUSCULAR | Status: DC
  Administered 2023-11-27 – 2023-11-29 (×3): 1 [IU] via SUBCUTANEOUS

## 2023-11-27 MED ORDER — GLUCERNA SHAKE PO LIQD
237.0000 mL | Freq: Three times a day (TID) | ORAL | Status: DC
  Administered 2023-11-27 – 2023-11-29 (×5): 237 mL via ORAL
  Filled 2023-11-27: qty 237

## 2023-11-27 MED ORDER — ARFORMOTEROL TARTRATE 15 MCG/2ML IN NEBU: 15.0000 ug | INHALATION_SOLUTION | Freq: Two times a day (BID) | RESPIRATORY_TRACT | Status: DC

## 2023-11-27 MED ORDER — ALBUTEROL SULFATE (2.5 MG/3ML) 0.083% IN NEBU
2.5000 mg | INHALATION_SOLUTION | Freq: Four times a day (QID) | RESPIRATORY_TRACT | Status: DC
  Administered 2023-11-27: 2.5 mg via RESPIRATORY_TRACT
  Filled 2023-11-27: qty 3

## 2023-11-27 MED ORDER — VITAMIN D 25 MCG (1000 UNIT) PO TABS
5000.0000 [IU] | ORAL_TABLET | Freq: Every morning | ORAL | Status: DC
  Administered 2023-11-27 – 2023-11-29 (×3): 5000 [IU] via ORAL
  Filled 2023-11-27 (×3): qty 5

## 2023-11-27 MED ORDER — ATORVASTATIN CALCIUM 10 MG PO TABS
10.0000 mg | ORAL_TABLET | Freq: Every day | ORAL | Status: DC
  Administered 2023-11-27 – 2023-11-28 (×2): 10 mg via ORAL
  Filled 2023-11-27 (×2): qty 1

## 2023-11-27 MED ORDER — LACTATED RINGERS IV BOLUS
1000.0000 mL | Freq: Once | INTRAVENOUS | Status: AC
  Administered 2023-11-27: 1000 mL via INTRAVENOUS

## 2023-11-27 MED ORDER — BUDESONIDE 0.5 MG/2ML IN SUSP
0.5000 mg | Freq: Two times a day (BID) | RESPIRATORY_TRACT | Status: DC
  Administered 2023-11-28 – 2023-11-29 (×3): 0.5 mg via RESPIRATORY_TRACT
  Filled 2023-11-27 (×3): qty 2

## 2023-11-27 MED ORDER — IPRATROPIUM-ALBUTEROL 0.5-2.5 (3) MG/3ML IN SOLN
3.0000 mL | Freq: Four times a day (QID) | RESPIRATORY_TRACT | Status: DC
  Administered 2023-11-27 – 2023-11-29 (×6): 3 mL via RESPIRATORY_TRACT
  Filled 2023-11-27 (×7): qty 3

## 2023-11-27 MED ORDER — MIRTAZAPINE 15 MG PO TABS
30.0000 mg | ORAL_TABLET | Freq: Every evening | ORAL | Status: DC | PRN
  Administered 2023-11-28: 30 mg via ORAL
  Filled 2023-11-27: qty 2

## 2023-11-27 NOTE — ED Notes (Signed)
 Pt's sister at bedside and pt agrees to BiPAP.

## 2023-11-27 NOTE — ED Provider Notes (Signed)
 [place] EMERGENCY DEPARTMENT AT [facility] Provider Note   CSN: [id number] Arrival date & time: 11/27/23  0139     Patient presents with: Respiratory Distress   Blake Mcknight is a 68 y.o. male.   68 year old male presents to the ED today with COPD exacerbation.  Patient states that he has a history of COPD has been hospitalized multiple times for it.  Has inhalers at home but they were not working.  Got worse for the last 2 to 3 days.  Called EMS.  Patient with absent breath sounds on their arrival.  Given magnesium , epinephrine , breathing treatments and Solu-Medrol  en route.  Patient states he is feeling little bit better but not his baseline        Prior to Admission medications   Medication Sig Start Date End Date Taking? Authorizing Provider  acetaminophen  (TYLENOL ) 500 MG tablet Take 2 tablets (1,000 mg total) by mouth every 8 (eight) hours. Patient taking differently: Take 1,000 mg by mouth every 8 (eight) hours as needed for mild pain (pain score 1-3) or headache. 08/14/23  Yes Fausto Sor A, DO  albuterol  (VENTOLIN  HFA) 108 (90 Base) MCG/ACT inhaler Inhale 2 puffs into the lungs every 4 (four) hours as needed for wheezing or shortness of breath. 10/02/22  Yes Bernard Drivers, MD  apixaban  (ELIQUIS ) 5 MG TABS tablet Take 1 tablet (5 mg total) by mouth 2 (two) times daily. 10/22/23  Yes Shelah Lamar RAMAN, MD  arformoterol  (BROVANA ) 15 MCG/2ML NEBU Take 2 mLs (15 mcg total) by nebulization 2 (two) times daily. 09/10/23  Yes Will Almarie MATSU, MD  atorvastatin  (LIPITOR) 10 MG tablet Take 1 tablet (10 mg total) by mouth at bedtime. 08/14/23  Yes Fausto Sor A, DO  benztropine  (COGENTIN ) 2 MG tablet Take 1 tablet (2 mg total) by mouth 2 (two) times daily. 07/30/23  Yes Arfeen, Leni DASEN, MD  carvedilol  (COREG ) 3.125 MG tablet Take 2 tablets (6.25 mg total) by mouth 2 (two) times daily with a meal. 09/10/23  Yes Will Almarie MATSU, MD  cetirizine (ZYRTEC) 10 MG  tablet Take 10 mg by mouth daily as needed for allergies or rhinitis.   Yes [provider]  cholecalciferol  (VITAMIN D ) 25 MCG (1000 UNIT) tablet Take 5,000 Units by mouth every morning.   Yes [provider]  cyanocobalamin  1000 MCG tablet Take 1,000 mcg by mouth daily.   Yes [provider]  famotidine  (PEPCID ) 40 MG tablet Take 40 mg by mouth 2 (two) times daily. 12/28/18  Yes [provider]  feeding supplement, GLUCERNA SHAKE, (GLUCERNA SHAKE) LIQD Take 237 mLs by mouth 3 (three) times daily between meals. 08/14/23  Yes Fausto Sor A, DO  hydrOXYzine  (ATARAX ) 25 MG tablet Take 1 tablet (25 mg total) by mouth 3 (three) times daily as needed for anxiety. 10/12/23  Yes Sheikh, Omair Latif, DO  ipratropium-albuterol  (DUONEB) 0.5-2.5 (3) MG/3ML SOLN Take 3 mLs by nebulization every 4 (four) hours. Patient taking differently: Take 3 mLs by nebulization every 4 (four) hours as needed. 08/14/23  Yes Fausto Sor A, DO  JANUVIA 100 MG tablet Take 100 mg by mouth every evening. 01/30/23  Yes [provider]  lidocaine  (LIDODERM ) 5 % Place 1 patch onto the skin daily. Remove & Discard patch within 12 hours or as directed by MD 09/10/23  Yes Will Almarie MATSU, MD  methocarbamol  (ROBAXIN ) 750 MG tablet Take 1 tablet (750 mg total) by mouth every 8 (eight) hours as needed for  muscle spasms. 09/10/23  Yes Will Almarie MATSU, MD  mirtazapine  (REMERON ) 30 MG tablet Take 1 tablet (30 mg total) by mouth at bedtime as needed (insomnia). 07/30/23 07/29/24 Yes Arfeen, Leni DASEN, MD  multivitamin (ONE-A-DAY MEN'S) TABS tablet Take 1 tablet by mouth daily.   Yes [provider]  OXYGEN  Inhale 2 L/min into the lungs continuous.    Yes [provider]  polyethylene glycol (MIRALAX  / GLYCOLAX ) 17 g packet Take 17 g by mouth daily. 09/10/23  Yes Will Almarie MATSU, MD  predniSONE  (DELTASONE ) 20 MG tablet Take 1 tablet (20 mg total) by mouth daily with  breakfast. 11/09/23  Yes Arrien, Elidia Sieving, MD  risperiDONE  (RISPERDAL ) 3 MG tablet Take 1 tablet (3 mg total) by mouth 2 (two) times daily. 07/30/23  Yes Arfeen, Leni DASEN, MD  sodium chloride  (OCEAN) 0.65 % SOLN nasal spray Place 1 spray into both nostrils as needed for congestion.   Yes [provider]  tamsulosin  (FLOMAX ) 0.4 MG CAPS capsule Take 0.4 mg by mouth in the morning and at bedtime. 07/23/20  Yes [provider]  traMADol  (ULTRAM ) 50 MG tablet Take 1 tablet (50 mg total) by mouth every 6 (six) hours as needed. 10/21/23  Yes Shelah Lamar RAMAN, MD  TRELEGY ELLIPTA  100-62.5-25 MCG/ACT AEPB Inhale 1 puff into the lungs daily. 08/29/23  Yes [provider]    Allergies: Patient has no known allergies.    Review of Systems  Updated Vital Signs BP (!) 171/94   Pulse (!) 129   Temp (!) 96.6 F (35.9 C) (Axillary)   Resp 18   Ht 5' 6 (1.676 m)   Wt 63.1 kg   SpO2 100%   BMI 22.45 kg/m   Physical Exam Vitals and nursing note reviewed.  Constitutional:      General: He is in acute distress.     Appearance: He is well-developed.  HENT:     Head: Normocephalic and atraumatic.  Cardiovascular:     Rate and Rhythm: Tachycardia present.  Pulmonary:     Effort: Pulmonary effort is normal. Tachypnea present. No respiratory distress.     Breath sounds: Decreased air movement present. No wheezing.     Comments: Significantly decreased air movement on the right compared to the left.  Wheezing mostly on the left. Abdominal:     General: There is no distension.  Musculoskeletal:        General: Normal range of motion.     Cervical back: Normal range of motion.  Neurological:     Mental Status: He is alert.     (all labs ordered are listed, but only abnormal results are displayed) Labs Reviewed  CBC WITH DIFFERENTIAL/PLATELET - Abnormal; Notable for the following components:      Result Value   WBC 12.7 (*)    Hemoglobin 11.9 (*)    HCT 37.8 (*)     RDW 16.5 (*)    Monocytes Absolute 1.3 (*)    Eosinophils Absolute 0.6 (*)    All other components within normal limits  COMPREHENSIVE METABOLIC PANEL WITH GFR - Abnormal; Notable for the following components:   Glucose, Bld 197 (*)    BUN <5 (*)    Creatinine, Ser 0.58 (*)    Calcium  8.8 (*)    Albumin 2.9 (*)    All other components within normal limits  I-STAT VENOUS BLOOD GAS, ED - Abnormal; Notable for the following components:   pO2, Ven 101 (*)  Bicarbonate 31.3 (*)    TCO2 33 (*)    Acid-Base Excess 5.0 (*)    Sodium 134 (*)    HCT 36.0 (*)    Hemoglobin 12.2 (*)    All other components within normal limits  BRAIN NATRIURETIC PEPTIDE  TROPONIN I (HIGH SENSITIVITY)  TROPONIN I (HIGH SENSITIVITY)    EKG: None  Radiology: DG Chest Portable 1 View Result Date: 11/27/2023 EXAM: 1 VIEW XRAY OF THE CHEST 11/27/2023 03:09:31 AM COMPARISON: 11/04/2023 CLINICAL HISTORY: SOB, decreased on right. FINDINGS: LUNGS AND PLEURA: Advanced emphysema with bullous change. Atelectasis or pneumonia in the bilateral cardiophrenic angles. No pleural effusion or pneumothorax. HEART AND MEDIASTINUM: No acute abnormality of the cardiac and mediastinal silhouettes. BONES AND SOFT TISSUES: No acute osseous abnormality. IMPRESSION: 1. Atelectasis or pneumonia in the bilateral cardiophrenic angles. 2. Advanced emphysema with bullous change. Electronically signed by: Norman Gatlin MD 11/27/2023 03:33 AM EDT RP Workstation: [id number]     .Critical Care  Performed by: Lorette Mayo, MD Authorized by: Lorette Mayo, MD   Critical care provider statement:    Critical care time (minutes):  30   Critical care was time spent personally by me on the following activities:  Development of treatment plan with patient or surrogate, discussions with consultants, evaluation of patient's response to treatment, examination of patient, ordering and review of laboratory studies, ordering and review of radiographic  studies, ordering and performing treatments and interventions, pulse oximetry, re-evaluation of patient's condition and review of old charts    Medications Ordered in the ED  doxycycline  (VIBRAMYCIN ) 100 mg in sodium chloride  0.9 % 250 mL IVPB (0 mg Intravenous Stopped 11/27/23 0445)  albuterol  (PROVENTIL ) (2.5 MG/3ML) 0.083% nebulizer solution (10 mg/hr Nebulization Given 11/27/23 0234)  lactated ringers  bolus 1,000 mL (1,000 mLs Intravenous New Bag/Given 11/27/23 0445)                                    Medical Decision Making Amount and/or Complexity of Data Reviewed Labs: ordered. Radiology: ordered. ECG/medicine tests: ordered.  Risk Prescription drug management. Decision regarding hospitalization.   Patient here with respiratory distress likely related to COPD exacerbation.  Already received Solu-Medrol  magnesium  epinephrine  and breathing treatments and route which reportedly had improved.  Initially refused BiPAP but subsequently his sister was able to convince him.  While he was on BiPAP he is much more comfortable and is actually able to sleep.  Venous blood gas reassuring.  More breathing treatments were given here including continuous albuterol .  Will discuss with hospitalist for admission     Final diagnoses:  COPD exacerbation [facility])    ED Discharge Orders     None          Eugune Sine, Mayo, MD 11/27/23 [phone]

## 2023-11-27 NOTE — ED Notes (Signed)
 CCMD called.

## 2023-11-27 NOTE — Progress Notes (Signed)
 RT took pt off bipap and placed pt on 2L [place], sats currently 100%. Pt is in no distress at this time and bipap is on s/b at bedside.

## 2023-11-27 NOTE — Progress Notes (Signed)
 Pt is currently on bipap and is tolerating it well at this time.    11/27/23 0748  BiPAP/CPAP/SIPAP  BiPAP/CPAP/SIPAP Pt Type Adult  BiPAP/CPAP/SIPAP SERVO  Mask Type Full face mask  Mask Size Medium  Set Rate 15 breaths/min  Respiratory Rate 18 breaths/min  IPAP 13 cmH20  EPAP 5 cmH2O  PEEP 5 cmH20  FiO2 (%) 30 %  Minute Ventilation 12.5  Leak 23  Peak Inspiratory Pressure (PIP) 13  Tidal Volume (Vt) 723  Patient Home Machine No  Patient Home Mask No  Patient Home Tubing No  Auto Titrate No  Press High Alarm 30 cmH2O

## 2023-11-27 NOTE — ED Notes (Addendum)
 Pt adamantly refusing BIPAP. Pt not allowing RT to place mask on pt and not allowing staff to connect pt to cardiac monitor. Pt educated on reasoning for BIPAP and risks of not using BIPAP by RT and PA but continued to refuse BIPAP. MD Mesner came to bedside and explained to pt that he is at risk for deterioration. Pt continued to refuse BIPAP. Will continue monitor pt.

## 2023-11-27 NOTE — ED Triage Notes (Signed)
 BIBEMS for SOB. Pt found to be in respiratory distress with RA 60%. Pt given 10 albuterol , 1 Atrovent , .15 epi, 125mg  solumedrol, 2g mag. Pt audibly wheezing and unable to speak in full sentences

## 2023-11-27 NOTE — ED Notes (Addendum)
 Care of pt assumed from [facility]. Pt resting in bed, O2 reconnected to wall, pt changed into gown, placed on cardiac monitoring and verified with tele room. Pt asking for breathing treatment at this time, provider messaged as no order available. Pt in NAD, VSS, spouse at bedside.

## 2023-11-27 NOTE — Progress Notes (Signed)
 Patient wearing oxygen  set at 2lpm with Sp02=97%. Patient has no c/o being short of breath at this time.

## 2023-11-27 NOTE — Progress Notes (Signed)
   11/27/23 0242  BiPAP/CPAP/SIPAP  $ Non-Invasive Ventilator  Non-Invasive Vent Set Up;Non-Invasive Vent Initial  $ Face Mask Medium Yes  BiPAP/CPAP/SIPAP Pt Type Adult  BiPAP/CPAP/SIPAP SERVO  Mask Type Full face mask  Dentures removed? Not applicable  Mask Size Medium  Set Rate 15 breaths/min  Respiratory Rate 27 breaths/min  IPAP 13 cmH20  EPAP 5 cmH2O  Pressure Support 8 cmH20  PEEP 5 cmH20  FiO2 (%) 30 %  Flow Rate 0 lpm  Minute Ventilation 18.5  Leak 25  Peak Inspiratory Pressure (PIP) 16  Tidal Volume (Vt) 818  Patient Home Machine No  Patient Home Mask No  Patient Home Tubing No  Auto Titrate No  Press High Alarm 25 cmH2O  CPAP/SIPAP surface wiped down Yes  Device Plugged into [employer] Power Outlet Yes  Oxygen  Percent 30 %  BiPAP/CPAP /SiPAP Vitals  Bilateral Breath Sounds Expiratory wheezes;Diminished

## 2023-11-27 NOTE — H&P (Signed)
 History and Physical    Patient: Blake Mcknight FMW:[id number] DOB: [birth year] DOA: 11/27/2023 DOS: the patient was seen and examined on 11/27/2023 PCP: [name], MD  Patient coming from: Home via EMS  Chief Complaint:  Chief Complaint  Patient presents with   Respiratory Distress   HPI: Blake Mcknight is a 68 y.o. male with medical history significant of COPD, lung cancer, and chronic hypoxic respiratory failure hypertension, hyperlipidemia, history of PE on Eliquis , and schizophrenia presents with difficulty breathing and confusion.  History is mostly obtained from his sister who is present at bedside as he is currently on BiPAP.  He has been experiencing difficulty breathing, which is less severe than previous episodes. He also notes feeling slightly confused. He was discharged from the hospital approximately two weeks ago.  And appears over the last 3 months has been hospitalized at least 6 times for COPD exacerbations with acute on chronic respiratory failure.  He denies significant fever, with the highest recorded temperature being 97.58F, and denies night sweats. He has a non-productive cough, described as 'not loose,' unlike previous instances where he could hear movement in his chest. He has been taking Mucinex , which has resulted in some improvement.  He has a history of blood clots in the lungs, discovered during a previous hospital visit. He has been taking Eliquis  regularly, although he may have missed a dose occasionally. He was hospitalized three times in quick succession prior to this visit, during which the blood clots were identified. He is scheduled for a PET scan on the upcoming Monday, which was delayed due to his hospitalization.  En route with EMS patient found to be in respiratory distress with O2 saturation 60% on room air.  Patient was given albuterol , Atrovent , epinephrine  0.15 mg, Solu-Medrol  125 mg IV, and magnesium  sulfate 2 g IV.  In the ED  patient was noted to have temperature 96.6 F with tachycardia and tachypnea, and O2 saturations maintained on BiPAP.  Labs significant for WBC 12.7, hemoglobin 11.9, glucose 197, albumin 2.9, BNP 52.7, and high-sensitivity troponins negative x 2.  Venous blood gas noted pH 7.363, and pCO2 54.9.  Patient noted to have atelectasis or pneumonia in the bilateral cardiophrenic angles and advanced emphysema with bullous changes.  Patient was given a continuous albuterol  breathing treatment and doxycycline  IV while in the ED.   Review of Systems: As mentioned in the history of present illness. All other systems reviewed and are negative. Past Medical History:  Diagnosis Date   Abdominal pain 06/03/2013   Chest pain 08/10/2023   COPD (chronic obstructive pulmonary disease) (HCC)    with bullous emphysema.    COPD exacerbation (HCC) 08/09/2023   Heavy cigarette smoker before 2003   pt claims only 10 cigs per day, never heavier amounts.    HLD (hyperlipidemia)    Hypertension    Schizophrenia (HCC) 06/28/2013   This is a chronic condition and he lives with family   Past Surgical History:  Procedure Laterality Date   CHOLECYSTECTOMY N/A 06/06/2013   Procedure: LAPAROSCOPIC CHOLECYSTECTOMY WITH ATTEMPTED INTRAOPERATIVE CHOLANGIOGRAM;  Surgeon: Dann FORBES Hummer, MD;  Location: [facility] OR;  Service: General;  Laterality: N/A;   ERCP N/A 06/03/2013   Procedure: ENDOSCOPIC RETROGRADE CHOLANGIOPANCREATOGRAPHY (ERCP);  Surgeon: Gwendlyn ONEIDA Buddy, MD;  Location: [facility] ENDOSCOPY;  Service: Endoscopy;  Laterality: N/A;   Social History:  reports that he has quit smoking. His smoking use included cigarettes. He has a 41 pack-year smoking history. He has never used smokeless  tobacco. He reports that he does not drink alcohol and does not use drugs.  No Known Allergies  Family History  Problem Relation Age of Onset   Diabetes Mellitus II Mother    Diabetes Mother    Heart disease Father    Stroke Maternal Aunt     CAD Neg Hx     Prior to Admission medications   Medication Sig Start Date End Date Taking? Authorizing Provider  acetaminophen  (TYLENOL ) 500 MG tablet Take 2 tablets (1,000 mg total) by mouth every 8 (eight) hours. Patient taking differently: Take 1,000 mg by mouth every 8 (eight) hours as needed for mild pain (pain score 1-3) or headache. 08/14/23  Yes Fausto Sor A, DO  albuterol  (VENTOLIN  HFA) 108 (90 Base) MCG/ACT inhaler Inhale 2 puffs into the lungs every 4 (four) hours as needed for wheezing or shortness of breath. 10/02/22  Yes Bernard Drivers, MD  apixaban  (ELIQUIS ) 5 MG TABS tablet Take 1 tablet (5 mg total) by mouth 2 (two) times daily. 10/22/23  Yes Shelah Lamar RAMAN, MD  arformoterol  (BROVANA ) 15 MCG/2ML NEBU Take 2 mLs (15 mcg total) by nebulization 2 (two) times daily. 09/10/23  Yes Will Almarie MATSU, MD  atorvastatin  (LIPITOR) 10 MG tablet Take 1 tablet (10 mg total) by mouth at bedtime. 08/14/23  Yes Fausto Sor A, DO  benztropine  (COGENTIN ) 2 MG tablet Take 1 tablet (2 mg total) by mouth 2 (two) times daily. 07/30/23  Yes Arfeen, Leni DASEN, MD  carvedilol  (COREG ) 3.125 MG tablet Take 2 tablets (6.25 mg total) by mouth 2 (two) times daily with a meal. 09/10/23  Yes Will Almarie MATSU, MD  cetirizine (ZYRTEC) 10 MG tablet Take 10 mg by mouth daily as needed for allergies or rhinitis.   Yes [provider]  cholecalciferol  (VITAMIN D ) 25 MCG (1000 UNIT) tablet Take 5,000 Units by mouth every morning.   Yes [provider]  cyanocobalamin  1000 MCG tablet Take 1,000 mcg by mouth daily.   Yes [provider]  famotidine  (PEPCID ) 40 MG tablet Take 40 mg by mouth 2 (two) times daily. 12/28/18  Yes [provider]  feeding supplement, GLUCERNA SHAKE, (GLUCERNA SHAKE) LIQD Take 237 mLs by mouth 3 (three) times daily between meals. 08/14/23  Yes Fausto Sor A, DO  hydrOXYzine  (ATARAX ) 25 MG tablet Take 1 tablet (25 mg total) by mouth 3 (three) times daily  as needed for anxiety. 10/12/23  Yes Sheikh, Omair Latif, DO  ipratropium-albuterol  (DUONEB) 0.5-2.5 (3) MG/3ML SOLN Take 3 mLs by nebulization every 4 (four) hours. Patient taking differently: Take 3 mLs by nebulization every 4 (four) hours as needed. 08/14/23  Yes Fausto Sor A, DO  JANUVIA 100 MG tablet Take 100 mg by mouth every evening. 01/30/23  Yes [provider]  lidocaine  (LIDODERM ) 5 % Place 1 patch onto the skin daily. Remove & Discard patch within 12 hours or as directed by MD 09/10/23  Yes Will Almarie MATSU, MD  methocarbamol  (ROBAXIN ) 750 MG tablet Take 1 tablet (750 mg total) by mouth every 8 (eight) hours as needed for muscle spasms. 09/10/23  Yes Will Almarie MATSU, MD  mirtazapine  (REMERON ) 30 MG tablet Take 1 tablet (30 mg total) by mouth at bedtime as needed (insomnia). 07/30/23 07/29/24 Yes Arfeen, Leni DASEN, MD  multivitamin (ONE-A-DAY MEN'S) TABS tablet Take 1 tablet by mouth daily.   Yes [provider]  OXYGEN  Inhale 2 L/min into the lungs continuous.    Yes [provider]  polyethylene glycol (MIRALAX  / GLYCOLAX ) 17 g packet Take 17 g by mouth daily. 09/10/23  Yes Will Almarie MATSU, MD  predniSONE  (DELTASONE ) 20 MG tablet Take 1 tablet (20 mg total) by mouth daily with breakfast. 11/09/23  Yes Arrien, Elidia Sieving, MD  risperiDONE  (RISPERDAL ) 3 MG tablet Take 1 tablet (3 mg total) by mouth 2 (two) times daily. 07/30/23  Yes Arfeen, Leni DASEN, MD  sodium chloride  (OCEAN) 0.65 % SOLN nasal spray Place 1 spray into both nostrils as needed for congestion.   Yes [provider]  tamsulosin  (FLOMAX ) 0.4 MG CAPS capsule Take 0.4 mg by mouth in the morning and at bedtime. 07/23/20  Yes [provider]  traMADol  (ULTRAM ) 50 MG tablet Take 1 tablet (50 mg total) by mouth every 6 (six) hours as needed. 10/21/23  Yes Shelah Lamar RAMAN, MD  TRELEGY ELLIPTA  100-62.5-25 MCG/ACT AEPB Inhale 1 puff into the lungs daily. 08/29/23  Yes [provider]    Physical Exam: Vitals:   11/27/23 0400 11/27/23 0548 11/27/23 0715 11/27/23 0748  BP:      Pulse:      Resp: 18   18  Temp:  (!) 96.6 F (35.9 C)    TempSrc:  Axillary    SpO2:   100%   Weight:      Height:      Exam  Constitutional: Elderly male currently in no acute distress Eyes: PERRL, lids and conjunctivae normal ENMT: Mucous membranes are moist. Posterior pharynx clear of any exudate or lesions.Normal dentition.  Neck: normal, supple, no masses, no thyromegaly Respiratory: Decreased overall aeration with some expiratory wheezes appreciated currently on BiPAP. Cardiovascular: Regular rate and rhythm, no murmurs / rubs / gallops. No extremity edema. 2+ pedal pulses.   Abdomen: no tenderness, no masses palpated. Bowel sounds positive.  Musculoskeletal: no clubbing / cyanosis. No joint deformity upper and lower extremities. Good ROM, no contractures. Normal muscle tone.  Skin: no rashes, lesions, ulcers. No induration Neurologic: CN 2-12 grossly intact.  . Strength 5/5 in all 4.  Psychiatric: Alert and oriented to person and place.  Data Reviewed:  EKG reveals sinus rhythm 87 bpm with right axis deviation.  Reviewed labs, imaging, and pertinent records as documented.  Assessment and Plan:  Chronic respiratory failure with hypoxia Acute on chronic COPD with exacerbation Possible community-acquired pneumonia Patient presented in respiratory distress with report of O2 saturations in the 60s on room air by EMS.  He was switched to BiPAP after getting to the ED.  Venous blood gas did not note any significant signs for hypercapnia.  On physical exam noted to have decreased aeration with wheezing throughout both lung fields.  Chest x-ray revealed atelectasis or pneumonia in the bilateral costophrenic angles and advanced emphysema with bullous changes.  Patient had received albuterol , Atrovent , epinephrine  0.15 mg, Solu-Medrol  125 mg IV,  magnesium  sulfate 2 g IV  with EMS.  Patient had received a continuous hour-long albuterol  breathing treatment and doxycycline  IV. - Admit to a progressive bed - Continuous pulse oximetry with oxygen  maintain O2 saturation greater than 90% - Wean off BiPAP once able - Aspiration precautions with elevation head of the bed - Incentive spirometry and flutter - Add on procalcitonin - Albuterol  nebs 4 times daily and as needed for shortness of breath - Revefenacin  inhaled daily - Brovana  and budesonide  nebs twice daily - Solu-Medrol  40 mg twice daily.  Determine when medically appropriate to wean off - Continue doxycycline   - Mucinex   Essential hypertension Blood pressures elevated up to 171/94. - Continue Coreg  - Hydralazine  as needed for elevated blood pressures  Diabetes mellitus type 2 with hyperlipidemia On admission glucose noted to be elevated at 197.  Last available A1c was 5.9 when checked on 08/10/2022. - Hypoglycemia protocols  - Hold Januvia - Continue statin - CBGs before every meal with sensitive SSI while on IV steroids - Adjust regimen as deemed medically appropriate  History of pulmonary embolism - Continue Eliquis   Malignant neoplasm of the right lower lobe Patient with history of lung cancer s/p XRT.  Patient being followed by pulmonology with plan for PET scan scheduled for 9/15 - Recommend continued outpatient with pulmonology  Schizophrenia - Continue Risperdal , Cogentin , mirtazapine , and hydroxyzine  as needed  BPH - Continue Flomax   DVT prophylaxis: Eliquis  Advance Care Planning:   Code Status: Limited: Do not attempt resuscitation (DNR) -DNR-LIMITED -Do Not Intubate/DNI     Consults: None  Family Communication: Patient Sister updated at bedside  Severity of Illness: The appropriate patient status for this patient is INPATIENT. Inpatient status is judged to be reasonable and necessary in order to provide the required intensity of service to ensure the patient's safety. The  patient's presenting symptoms, physical exam findings, and initial radiographic and laboratory data in the context of their chronic comorbidities is felt to place them at high risk for further clinical deterioration. Furthermore, it is not anticipated that the patient will be medically stable for discharge from the hospital within 2 midnights of admission.   * I certify that at the point of admission it is my clinical judgment that the patient will require inpatient hospital care spanning beyond 2 midnights from the point of admission due to high intensity of service, high risk for further deterioration and high frequency of surveillance required.*  Author: Maximino DELENA Sharps, MD 11/27/2023 7:57 AM  For on call review [email or web address].

## 2023-11-28 DIAGNOSIS — Z86711 Personal history of pulmonary embolism: Secondary | ICD-10-CM | POA: Diagnosis not present

## 2023-11-28 DIAGNOSIS — J441 Chronic obstructive pulmonary disease with (acute) exacerbation: Secondary | ICD-10-CM | POA: Diagnosis not present

## 2023-11-28 DIAGNOSIS — E1169 Type 2 diabetes mellitus with other specified complication: Secondary | ICD-10-CM | POA: Diagnosis not present

## 2023-11-28 DIAGNOSIS — I10 Essential (primary) hypertension: Secondary | ICD-10-CM | POA: Diagnosis not present

## 2023-11-28 LAB — BASIC METABOLIC PANEL WITH GFR
Anion gap: 12 (ref 5–15)
BUN: 7 mg/dL — ABNORMAL LOW (ref 8–23)
CO2: 22 mmol/L (ref 22–32)
Calcium: 8.8 mg/dL — ABNORMAL LOW (ref 8.9–10.3)
Chloride: 102 mmol/L (ref 98–111)
Creatinine, Ser: 0.61 mg/dL (ref 0.61–1.24)
GFR, Estimated: >60 mL/min (ref >60)
Glucose, Bld: 170 mg/dL — ABNORMAL HIGH (ref 70–99)
Potassium: 4.5 mmol/L (ref 3.5–5.1)
Sodium: 136 mmol/L (ref 135–145)

## 2023-11-28 LAB — GLUCOSE, CAPILLARY
Glucose-Capillary: 141 mg/dL — ABNORMAL HIGH (ref 70–99)
Glucose-Capillary: 99 mg/dL (ref 70–99)
Glucose-Capillary: 95 mg/dL (ref 70–99)
Glucose-Capillary: 141 mg/dL — ABNORMAL HIGH (ref 70–99)

## 2023-11-28 LAB — CBC
HCT: 31.7 % — ABNORMAL LOW (ref 39.0–52.0)
Hemoglobin: 10.4 g/dL — ABNORMAL LOW (ref 13.0–17.0)
MCH: 27.4 pg (ref 26.0–34.0)
MCHC: 32.8 g/dL (ref 30.0–36.0)
MCV: 83.4 fL (ref 80.0–100.0)
Platelets: 303 K/uL (ref 150–400)
RBC: 3.8 MIL/uL — ABNORMAL LOW (ref 4.22–5.81)
RDW: 16.3 % — ABNORMAL HIGH (ref 11.5–15.5)
WBC: 9.5 K/uL (ref 4.0–10.5)
nRBC: 0 % (ref 0.0–0.2)

## 2023-11-28 NOTE — Plan of Care (Signed)
  Problem: Education: Goal: Ability to describe self-care measures that may prevent or decrease complications (Diabetes Survival Skills Education) will improve Outcome: Progressing   Problem: Metabolic: Goal: Ability to maintain appropriate glucose levels will improve Outcome: Progressing   Problem: Activity: Goal: Risk for activity intolerance will decrease Outcome: Progressing   Problem: Nutrition: Goal: Adequate nutrition will be maintained Outcome: Progressing

## 2023-11-28 NOTE — Plan of Care (Signed)
  Problem: Coping: Goal: Ability to adjust to condition or change in health will improve Outcome: Progressing   Problem: Nutritional: Goal: Maintenance of adequate nutrition will improve Outcome: Progressing   Problem: Education: Goal: Knowledge of General Education information will improve Description: Including pain rating scale, medication(s)/side effects and non-pharmacologic comfort measures Outcome: Progressing   Problem: Clinical Measurements: Goal: Will remain free from infection Outcome: Progressing Goal: Diagnostic test results will improve Outcome: Progressing Goal: Respiratory complications will improve Outcome: Progressing

## 2023-11-28 NOTE — Progress Notes (Signed)
 PROGRESS NOTE        PATIENT DETAILS Name: Blake Mcknight Age: 68 y.o. Sex: male Date of Birth: [birth year] Admit Date: 11/27/2023 Admitting Physician Maximino DELENA Sharps, MD ERE:Xpoejumprx, Zachary, MD  Brief Summary: Patient is a 69 y.o.  male with history of COPD-on home O2, PE on Eliquis , lung cancer-presented with acute on chronic hypoxic respiratory failure secondary to COPD exacerbation.  He initially required BiPAP-but rapidly improved and was transitioned to nasal cannula.  Significant events: 9/12>> admit to [facility]  Significant studies: 9/12>> CXR: Atelectasis/pneumonia bilateral costophrenic angles  Significant microbiology data: None  Procedures: None none  Consults: None  Subjective: Feels better-stable on 2 L of oxygen .  Although better-still not yet back to baseline.  Objective: Vitals: Blood pressure (!) 142/63, pulse 81, temperature 97.6 F (36.4 C), temperature source Axillary, resp. rate 15, height 5' 6 (1.676 m), weight 63.1 kg, SpO2 96%.   Exam: Gen Exam:Alert awake-not in any distress HEENT:atraumatic, normocephalic Chest: Few scattered rhonchi. CVS:S1S2 regular Abdomen:soft non tender, non distended Extremities:no edema Neurology: Non focal Skin: no rash  Pertinent Labs/Radiology:    Latest Ref Rng & Units 11/28/2023    4:18 AM 11/27/2023    2:11 AM 11/27/2023    1:55 AM  CBC  WBC 4.0 - 10.5 K/uL 9.5   12.7   Hemoglobin 13.0 - 17.0 g/dL 89.5  87.7  88.0   Hematocrit 39.0 - 52.0 % 31.7  36.0  37.8   Platelets 150 - 400 K/uL 303   345     Lab Results  Component Value Date   NA 136 11/28/2023   K 4.5 11/28/2023   CL 102 11/28/2023   CO2 22 11/28/2023      Assessment/Plan: Acute on chronic hypoxic respiratory failure secondary to COPD exacerbation and PNA Improved-initially required BiPAP Back on 2 L of oxygen  Continue to treat underlying etiologies  COPD exacerbation Moving air well-some scattered  rhonchi Although improved-noted back to baseline Taper steroids Continue scheduled bronchodilators  PNA Afebrile-no leukocytosis Continue empiric antibiotics for now  History of VTE Continue Eliquis   HTN BP stable Coreg   DM-2 CBG stable on SSI Hold all oral hypoglycemic agents  Recent Labs    11/27/23 1254 11/27/23 2107 11/28/23 0730  GLUCAP 142* 193* 141*     History of malignant neoplasm right lower lobe lung Continue outpatient follow-up with oncology/pulmonology  Schizophrenia Stable Continue risperidone /Cogentin /mirtazapine  and as needed hydralazine   BPH Flomax    Code status:   Code Status: Limited: Do not attempt resuscitation (DNR) -DNR-LIMITED -Do Not Intubate/DNI    DVT Prophylaxis: apixaban  (ELIQUIS ) tablet 5 mg    Family Communication: None at bedside   Disposition Plan: Status is: Inpatient Remains inpatient appropriate because: Severity of illness   Planned Discharge Destination:Home   Diet: Diet Order             DIET DYS 3 Room service appropriate? Yes; Fluid consistency: Thin  Diet effective now                     Antimicrobial agents: Anti-infectives (From admission, onward)    Start     Dose/Rate Route Frequency Ordered Stop   11/27/23 1200  doxycycline  (VIBRAMYCIN ) 100 mg in sodium chloride  0.9 % 250 mL IVPB        100 mg 125 mL/hr over 120 Minutes Intravenous  Every 12 hours 11/27/23 0826     11/27/23 0145  doxycycline  (VIBRAMYCIN ) 100 mg in sodium chloride  0.9 % 250 mL IVPB        100 mg 125 mL/hr over 120 Minutes Intravenous  Once 11/27/23 0141 11/27/23 0445        MEDICATIONS: Scheduled Meds:  apixaban   5 mg Oral BID   arformoterol   15 mcg Nebulization BID   atorvastatin   10 mg Oral QHS   benztropine   2 mg Oral BID   budesonide  (PULMICORT ) nebulizer solution  0.5 mg Nebulization BID   carvedilol   6.25 mg Oral BID WC   cholecalciferol   5,000 Units Oral q morning   famotidine   40 mg Oral BID   feeding  supplement (GLUCERNA SHAKE)  237 mL Oral TID BM   guaiFENesin   600 mg Oral BID   insulin  aspart  0-9 Units Subcutaneous TID WC   ipratropium-albuterol   3 mL Nebulization QID   methylPREDNISolone  (SOLU-MEDROL ) injection  40 mg Intravenous Q12H   polyethylene glycol  17 g Oral Daily   revefenacin   175 mcg Nebulization Daily   risperiDONE   3 mg Oral BID   sodium chloride  flush  3 mL Intravenous Q12H   tamsulosin   0.4 mg Oral QPC supper   Continuous Infusions:  doxycycline  (VIBRAMYCIN ) IV 100 mg (11/28/23 0022)   PRN Meds:.acetaminophen  **OR** acetaminophen , albuterol , hydrALAZINE , hydrOXYzine , mirtazapine , traMADol    I have personally reviewed following labs and imaging studies  LABORATORY DATA: CBC: Recent Labs  Lab 11/27/23 0155 11/27/23 0211 11/28/23 0418  WBC 12.7*  --  9.5  NEUTROABS 7.7  --   --   HGB 11.9* 12.2* 10.4*  HCT 37.8* 36.0* 31.7*  MCV 84.9  --  83.4  PLT 345  --  303    Basic Metabolic Panel: Recent Labs  Lab 11/27/23 0155 11/27/23 0211 11/28/23 0418  NA 135 134* 136  K 4.8 4.7 4.5  CL 98  --  102  CO2 25  --  22  GLUCOSE 197*  --  170*  BUN <5*  --  7*  CREATININE 0.58*  --  0.61  CALCIUM  8.8*  --  8.8*    GFR: Estimated Creatinine Clearance: 81.1 mL/min (by C-G formula based on SCr of 0.61 mg/dL).  Liver Function Tests: Recent Labs  Lab 11/27/23 0155  AST 20  ALT 12  ALKPHOS 81  BILITOT 0.8  PROT 6.9  ALBUMIN 2.9*   No results for input(s): LIPASE, AMYLASE in the last 168 hours. No results for input(s): AMMONIA in the last 168 hours.  Coagulation Profile: No results for input(s): INR, PROTIME in the last 168 hours.  Cardiac Enzymes: No results for input(s): CKTOTAL, CKMB, CKMBINDEX, TROPONINI in the last 168 hours.  BNP (last 3 results) No results for input(s): PROBNP in the last 8760 hours.  Lipid Profile: No results for input(s): CHOL, HDL, LDLCALC, TRIG, CHOLHDL, LDLDIRECT in the last 72  hours.  Thyroid Function Tests: No results for input(s): TSH, T4TOTAL, FREET4, T3FREE, THYROIDAB in the last 72 hours.  Anemia Panel: No results for input(s): VITAMINB12, FOLATE, FERRITIN, TIBC, IRON, RETICCTPCT in the last 72 hours.  Urine analysis:    Component Value Date/Time   COLORURINE YELLOW 04/16/2023 0616   APPEARANCEUR CLEAR 04/16/2023 0616   LABSPEC 1.016 04/16/2023 0616   PHURINE 6.0 04/16/2023 0616   GLUCOSEU 150 (A) 04/16/2023 0616   HGBUR NEGATIVE 04/16/2023 0616   BILIRUBINUR NEGATIVE 04/16/2023 0616   KETONESUR NEGATIVE 04/16/2023 9383  PROTEINUR NEGATIVE 04/16/2023 0616   UROBILINOGEN 0.2 01/29/2019 1634   NITRITE NEGATIVE 04/16/2023 0616   LEUKOCYTESUR NEGATIVE 04/16/2023 0616    Sepsis Labs: Lactic Acid, Venous    Component Value Date/Time   LATICACIDVEN 1.1 11/04/2023 0944    MICROBIOLOGY: No results found for this or any previous visit (from the past 240 hours).  RADIOLOGY STUDIES/RESULTS: DG Chest Portable 1 View Result Date: 11/27/2023 EXAM: 1 VIEW XRAY OF THE CHEST 11/27/2023 03:09:31 AM COMPARISON: 11/04/2023 CLINICAL HISTORY: SOB, decreased on right. FINDINGS: LUNGS AND PLEURA: Advanced emphysema with bullous change. Atelectasis or pneumonia in the bilateral cardiophrenic angles. No pleural effusion or pneumothorax. HEART AND MEDIASTINUM: No acute abnormality of the cardiac and mediastinal silhouettes. BONES AND SOFT TISSUES: No acute osseous abnormality. IMPRESSION: 1. Atelectasis or pneumonia in the bilateral cardiophrenic angles. 2. Advanced emphysema with bullous change. Electronically signed by: Norman Gatlin MD 11/27/2023 03:33 AM EDT RP Workstation: [id number]     LOS: 1 day   Donalda Applebaum, MD  [facility]    To contact the attending provider between 7A-7P or the covering provider during after hours 7P-7A, please log into the web site [email or web address] and access using universal [place] password for  that web site. If you do not have the password, please call the hospital operator.  11/28/2023, 9:06 AM

## 2023-11-29 ENCOUNTER — Other Ambulatory Visit (HOSPITAL_COMMUNITY): Payer: Self-pay

## 2023-11-29 DIAGNOSIS — E1169 Type 2 diabetes mellitus with other specified complication: Secondary | ICD-10-CM | POA: Diagnosis not present

## 2023-11-29 DIAGNOSIS — J441 Chronic obstructive pulmonary disease with (acute) exacerbation: Secondary | ICD-10-CM | POA: Diagnosis not present

## 2023-11-29 DIAGNOSIS — I10 Essential (primary) hypertension: Secondary | ICD-10-CM | POA: Diagnosis not present

## 2023-11-29 DIAGNOSIS — J9621 Acute and chronic respiratory failure with hypoxia: Secondary | ICD-10-CM | POA: Diagnosis not present

## 2023-11-29 LAB — GLUCOSE, CAPILLARY: Glucose-Capillary: 136 mg/dL — ABNORMAL HIGH (ref 70–99)

## 2023-11-29 MED ORDER — IPRATROPIUM-ALBUTEROL 0.5-2.5 (3) MG/3ML IN SOLN: 3.0000 mL | Freq: Two times a day (BID) | RESPIRATORY_TRACT | Status: DC

## 2023-11-29 MED ORDER — AMOXICILLIN-POT CLAVULANATE 875-125 MG PO TABS
1.0000 | ORAL_TABLET | Freq: Two times a day (BID) | ORAL | 0 refills | Status: AC
  Filled 2023-11-29: qty 6, 3d supply, fill #0

## 2023-11-29 MED ORDER — PREDNISONE 20 MG PO TABS
ORAL_TABLET | ORAL | 1 refills | Status: DC
  Filled 2023-11-29: qty 30, 25d supply, fill #0

## 2023-11-29 NOTE — Discharge Summary (Signed)
 PATIENT DETAILS Name: Blake Mcknight Age: 68 y.o. Sex: male Date of Birth: 1955-04-17 MRN: [id number]. Admitting Physician: Maximino DELENA Sharps, MD ERE:Xpoejumprx, Zachary, MD  Admit Date: 11/27/2023 Discharge date: 11/29/2023  Recommendations for Outpatient Follow-up:  Follow up with PCP in 1-2 weeks Please obtain CMP/CBC in one week  Admitted From:  Home  Disposition: Home   Discharge Condition: good  CODE STATUS:   Code Status: Limited: Do not attempt resuscitation (DNR) -DNR-LIMITED -Do Not Intubate/DNI    Diet recommendation:  Diet Order             Diet - low sodium heart healthy           DIET DYS 3 Room service appropriate? Yes; Fluid consistency: Thin  Diet effective now                    Brief Summary: Patient is a 68 y.o.  male with history of COPD-on home O2, PE on Eliquis , lung cancer-presented with acute on chronic hypoxic respiratory failure secondary to COPD exacerbation.  He initially required BiPAP-but rapidly improved and was transitioned to nasal cannula.   Significant events: 9/12>> admit to [facility]   Significant studies: 9/12>> CXR: Atelectasis/pneumonia bilateral costophrenic angles   Significant microbiology data: None   Procedures: None none   Consults: None  Brief Hospital Course: Acute on chronic hypoxic respiratory failure secondary to COPD exacerbation and PNA Initially required BiPAP Rapidly improved with steroids/bronchodilators/antibiotics-tapered to his usual regimen of 2 L-remains stable overnight-thinks he is back to his baseline this morning-plan is to discharge him on tapering prednisone /bronchodilators/antibiotics.   COPD exacerbation See above Continue usual bronchodilator regimen on discharge.   PNA Afebrile-no leukocytosis On empiric IV antibiotics-Will be transition to oral antibiotics for a few more days on discharge.   History of VTE Continue Eliquis    HTN BP stable Coreg    DM-2 CBG stable on  SSI Resume all oral hypoglycemic agents.  History of malignant neoplasm right lower lobe lung Continue outpatient follow-up with oncology/pulmonology   Schizophrenia Stable Continue risperidone /Cogentin /mirtazapine  and as needed hydralazine    BPH Flomax    Discharge Diagnoses:  Active Problems:   Acute on chronic respiratory failure with hypoxia (HCC)   COPD with acute exacerbation (HCC)   HTN (hypertension)   Type 2 diabetes mellitus with hyperlipidemia (HCC)   History of pulmonary embolism   History of lung cancer   Schizophrenia (HCC)   BPH (benign prostatic hyperplasia)   Discharge Instructions:  Activity:  As tolerated with Full fall precautions use walker/cane & assistance as needed  Discharge Instructions     Call MD for:  difficulty breathing, headache or visual disturbances   Complete by: As directed    Diet - low sodium heart healthy   Complete by: As directed    Discharge instructions   Complete by: As directed    Follow with Primary MD  Hillman Zachary, MD in 1-2 weeks  Please get a complete blood count and chemistry panel checked by your Primary MD at your next visit, and again as instructed by your Primary MD.  Get Medicines reviewed and adjusted: Please take all your medications with you for your next visit with your Primary MD  Laboratory/radiological data: Please request your Primary MD to go over all hospital tests and procedure/radiological results at the follow up, please ask your Primary MD to get all Hospital records sent to his/her office.  In some cases, they will be blood work, cultures and biopsy results  pending at the time of your discharge. Please request that your primary care M.D. follows up on these results.  Also Note the following: If you experience worsening of your admission symptoms, develop shortness of breath, life threatening emergency, suicidal or homicidal thoughts you must seek medical attention immediately by calling 911  or calling your MD immediately  if symptoms less severe.  You must read complete instructions/literature along with all the possible adverse reactions/side effects for all the Medicines you take and that have been prescribed to you. Take any new Medicines after you have completely understood and accpet all the possible adverse reactions/side effects.   Do not drive when taking Pain medications or sleeping medications (Benzodaizepines)  Do not take more than prescribed Pain, Sleep and Anxiety Medications. It is not advisable to combine anxiety,sleep and pain medications without talking with your primary care practitioner  Special Instructions: If you have smoked or chewed Tobacco  in the last 2 yrs please stop smoking, stop any regular Alcohol  and or any Recreational drug use.  Wear Seat belts while driving.  Please note: You were cared for by a hospitalist during your hospital stay. Once you are discharged, your primary care physician will handle any further medical issues. Please note that NO REFILLS for any discharge medications will be authorized once you are discharged, as it is imperative that you return to your primary care physician (or establish a relationship with a primary care physician if you do not have one) for your post hospital discharge needs so that they can reassess your need for medications and monitor your lab values.   Increase activity slowly   Complete by: As directed       Allergies as of 11/29/2023   No Known Allergies      Medication List     TAKE these medications    acetaminophen  500 MG tablet Commonly known as: TYLENOL  Take 2 tablets (1,000 mg total) by mouth every 8 (eight) hours. What changed:  when to take this reasons to take this   albuterol  108 (90 Base) MCG/ACT inhaler Commonly known as: VENTOLIN  HFA Inhale 2 puffs into the lungs every 4 (four) hours as needed for wheezing or shortness of breath.   amoxicillin -clavulanate 875-125 MG  tablet Commonly known as: AUGMENTIN  Take 1 tablet by mouth 2 (two) times daily for 3 days.   apixaban  5 MG Tabs tablet Commonly known as: ELIQUIS  Take 1 tablet (5 mg total) by mouth 2 (two) times daily.   arformoterol  15 MCG/2ML Nebu Commonly known as: BROVANA  Take 2 mLs (15 mcg total) by nebulization 2 (two) times daily.   atorvastatin  10 MG tablet Commonly known as: LIPITOR Take 1 tablet (10 mg total) by mouth at bedtime.   benztropine  2 MG tablet Commonly known as: COGENTIN  Take 1 tablet (2 mg total) by mouth 2 (two) times daily.   carvedilol  3.125 MG tablet Commonly known as: COREG  Take 2 tablets (6.25 mg total) by mouth 2 (two) times daily with a meal.   cetirizine 10 MG tablet Commonly known as: ZYRTEC Take 10 mg by mouth daily as needed for allergies or rhinitis.   cholecalciferol  25 MCG (1000 UNIT) tablet Commonly known as: VITAMIN D3 Take 5,000 Units by mouth every morning.   cyanocobalamin  1000 MCG tablet Take 1,000 mcg by mouth daily.   famotidine  40 MG tablet Commonly known as: PEPCID  Take 40 mg by mouth 2 (two) times daily.   feeding supplement (GLUCERNA SHAKE) Liqd Take 237 mLs by  mouth 3 (three) times daily between meals.   hydrOXYzine  25 MG tablet Commonly known as: ATARAX  Take 1 tablet (25 mg total) by mouth 3 (three) times daily as needed for anxiety.   ipratropium-albuterol  0.5-2.5 (3) MG/3ML Soln Commonly known as: DUONEB Take 3 mLs by nebulization every 4 (four) hours. What changed:  when to take this reasons to take this   Januvia 100 MG tablet Generic drug: sitaGLIPtin Take 100 mg by mouth every evening.   lidocaine  5 % Commonly known as: LIDODERM  Place 1 patch onto the skin daily. Remove & Discard patch within 12 hours or as directed by MD   methocarbamol  750 MG tablet Commonly known as: ROBAXIN  Take 1 tablet (750 mg total) by mouth every 8 (eight) hours as needed for muscle spasms.   mirtazapine  30 MG tablet Commonly known as:  REMERON  Take 1 tablet (30 mg total) by mouth at bedtime as needed (insomnia).   multivitamin Tabs tablet Take 1 tablet by mouth daily.   OXYGEN  Inhale 2 L/min into the lungs continuous.   polyethylene glycol 17 g packet Commonly known as: MIRALAX  / GLYCOLAX  Take 17 g by mouth daily.   predniSONE  20 MG tablet Commonly known as: DELTASONE  Take 40 mg p.o. daily for 3 days, then 30 mg p.o. daily for 3 days, then go back on your usual maintenance regimen of prednisone  20 mg p.o. daily. What changed:  how much to take how to take this when to take this additional instructions   risperiDONE  3 MG tablet Commonly known as: RISPERDAL  Take 1 tablet (3 mg total) by mouth 2 (two) times daily.   sodium chloride  0.65 % Soln nasal spray Commonly known as: OCEAN Place 1 spray into both nostrils as needed for congestion.   tamsulosin  0.4 MG Caps capsule Commonly known as: FLOMAX  Take 0.4 mg by mouth in the morning and at bedtime.   traMADol  50 MG tablet Commonly known as: ULTRAM  Take 1 tablet (50 mg total) by mouth every 6 (six) hours as needed.   Trelegy Ellipta  100-62.5-25 MCG/ACT Aepb Generic drug: Fluticasone -Umeclidin-Vilant Inhale 1 puff into the lungs daily.        Follow-up Information     [name], MD. Schedule an appointment as soon as possible for a visit in 1 week(s).   Specialty: Pulmonary Disease Contact information: [address] [place] [address] [phone]                No Known Allergies   Other Procedures/Studies: DG Chest Portable 1 View Result Date: 11/27/2023 EXAM: 1 VIEW XRAY OF THE CHEST 11/27/2023 03:09:31 AM COMPARISON: 11/04/2023 CLINICAL HISTORY: SOB, decreased on right. FINDINGS: LUNGS AND PLEURA: Advanced emphysema with bullous change. Atelectasis or pneumonia in the bilateral cardiophrenic angles. No pleural effusion or pneumothorax. HEART AND MEDIASTINUM: No acute abnormality of the cardiac and mediastinal  silhouettes. BONES AND SOFT TISSUES: No acute osseous abnormality. IMPRESSION: 1. Atelectasis or pneumonia in the bilateral cardiophrenic angles. 2. Advanced emphysema with bullous change. Electronically signed by: Norman Gatlin MD 11/27/2023 03:33 AM EDT RP Workstation: [id number]   DG Swallowing Func-Speech Pathology Result Date: 11/06/2023 Table formatting from the original result was not included. Modified Barium Swallow Study Patient Details Name: Blake Mcknight MRN: [id number] Date of Birth: 12/21/1955 Today's Date: 11/06/2023 HPI/PMH: HPI: 68 y.o. male presents to [facility] hospital on 11/04/2023 with SOB and hypoxia. Per MD H&P note, pt has also been experiencing difficulty swallowing and clearing his secretions, increased AMS, and recurrent PNA (  recently hospitalized 8/12-8/14 with COPD exacerbation and possible PNA). Previous clinical swallowing evaluations (2017, 2018) were [facility]. PMH includes COPD (2L O2 baseline), lung CA s/p SBRT, HTN, HLD, schizophrenia, DM II. Clinical Impression: Clinical Impression: Pt has some reduced oral clearance especially with purees and cognitively does not follow commands for lingual hold, but his pharyngeal swallow appears to be [facility]. He has trace, transient penetration (PAS 2, considered to be normal) with liquids when consumed at a more rapid rate, but no aspiration occurs. Education was provided about different diet consistencies, and at this time, his sister would prefer to start with mechanical soft (Dys 3) diet and thin liquids. Factors that may increase risk of adverse event in presence of aspiration Noe & Lianne 2021): Factors that may increase risk of adverse event in presence of aspiration Noe & Lianne 2021): Reduced cognitive function Recommendations/Plan: Swallowing Evaluation Recommendations Swallowing Evaluation Recommendations Recommendations: PO diet PO Diet Recommendation: Dysphagia 3 (Mechanical soft); Thin liquids (Level 0) Liquid Administration via:  Cup; Straw Medication Administration: Whole meds with liquid Supervision: Staff to assist with self-feeding Swallowing strategies  : Minimize environmental distractions; Slow rate; Small bites/sips; Check for oral residue Postural changes: Position pt fully upright for meals; Stay upright 30-60 min after meals Oral care recommendations: Oral care BID (2x/day) Treatment Plan Treatment Plan Treatment recommendations: Therapy as outlined in treatment plan below Follow-up recommendations: No SLP follow up Functional status assessment: Patient has had a recent decline in their functional status and demonstrates the ability to make significant improvements in function in a reasonable and predictable amount of time. Treatment frequency: Min 2x/week Treatment duration: 1 week Interventions: Aspiration precaution training; Patient/family education; Trials of upgraded texture/liquids; Diet toleration management by SLP Recommendations Recommendations for follow up therapy are one component of a multi-disciplinary discharge planning process, led by the attending physician.  Recommendations may be updated based on patient status, additional functional criteria and insurance authorization. Assessment: Orofacial Exam: Orofacial Exam Oral Cavity - Dentition: Adequate natural dentition; Missing dentition Anatomy: Anatomy: WFL Boluses Administered: Boluses Administered Boluses Administered: Thin liquids (Level 0); Mildly thick liquids (Level 2, nectar thick); Puree; Solid  Oral Impairment Domain: Oral Impairment Domain Lip Closure: No labial escape Tongue control during bolus hold: Posterior escape of greater than half of bolus (due to cognition) Bolus preparation/mastication: Timely and efficient chewing and mashing Bolus transport/lingual motion: Brisk tongue motion Oral residue: Residue collection on oral structures Location of oral residue : Tongue Initiation of pharyngeal swallow : Valleculae  Pharyngeal Impairment Domain:  Pharyngeal Impairment Domain Soft palate elevation: No bolus between soft palate (SP)/pharyngeal wall (PW) Laryngeal elevation: Complete superior movement of thyroid cartilage with complete approximation of arytenoids to epiglottic petiole Anterior hyoid excursion: Complete anterior movement Epiglottic movement: Complete inversion Laryngeal vestibule closure: Complete, no air/contrast in laryngeal vestibule Pharyngeal stripping wave : Present - complete Pharyngeal contraction (A/P view only): N/A Pharyngoesophageal segment opening: Complete distension and complete duration, no obstruction of flow Tongue base retraction: No contrast between tongue base and posterior pharyngeal wall (PPW) Pharyngeal residue: Complete pharyngeal clearance Location of pharyngeal residue: N/A  Esophageal Impairment Domain: Esophageal Impairment Domain Esophageal clearance upright position: Esophageal retention Pill: Pill Consistency administered: Thin liquids (Level 0) Thin liquids (Level 0): [facility] Penetration/Aspiration Scale Score: Penetration/Aspiration Scale Score 1.  Material does not enter airway: Puree; Solid; Pill 2.  Material enters airway, remains ABOVE vocal cords then ejected out: Mildly thick liquids (Level 2, nectar thick); Thin liquids (Level 0) Compensatory Strategies: Compensatory Strategies Compensatory strategies:  No   General Information: Caregiver present: Yes (sister)  Diet Prior to this Study: NPO   Temperature : Normal   Respiratory Status: WFL   Supplemental O2: Nasal cannula   History of Recent Intubation: No  Behavior/Cognition: Alert; Cooperative; Pleasant mood; Requires cueing Self-Feeding Abilities: Able to self-feed Baseline vocal quality/speech: Normal No data recorded Volitional Swallow: Unable to elicit Exam Limitations: No limitations Goal Planning: Prognosis for improved oropharyngeal function: Good Barriers to Reach Goals: Cognitive deficits No data recorded Patient/Family Stated Goal: asking for food  and drink Consulted and agree with results and recommendations: Patient; Family member/caregiver Pain: Pain Assessment Pain Assessment: No/denies pain Faces Pain Scale: 0 Breathing: 0 Negative Vocalization: 0 Facial Expression: 0 Body Language: 0 Consolability: 0 PAINAD Score: 0 Pain Intervention(s): Monitored during session End of Session: Start Time:SLP Start Time (ACUTE ONLY): 1036 Stop Time: SLP Stop Time (ACUTE ONLY): 1052 Time Calculation:SLP Time Calculation (min) (ACUTE ONLY): 16 min Charges: SLP Evaluations $ SLP Speech Visit: 1 Visit SLP Evaluations $BSS Swallow: 1 Procedure $MBS Swallow: 1 Procedure $Swallowing Treatment: 1 Procedure SLP visit diagnosis: SLP Visit Diagnosis: Dysphagia, unspecified (R13.10) Past Medical History: Past Medical History: Diagnosis Date  Abdominal pain 06/03/2013  Chest pain 08/10/2023  COPD (chronic obstructive pulmonary disease) (HCC)   with bullous emphysema.   COPD exacerbation (HCC) 08/09/2023  Heavy cigarette smoker before 2003  pt claims only 10 cigs per day, never heavier amounts.   HLD (hyperlipidemia)   Hypertension   Schizophrenia (HCC) 06/28/2013  This is a chronic condition and he lives with family Past Surgical History: Past Surgical History: Procedure Laterality Date  CHOLECYSTECTOMY N/A 06/06/2013  Procedure: LAPAROSCOPIC CHOLECYSTECTOMY WITH ATTEMPTED INTRAOPERATIVE CHOLANGIOGRAM;  Surgeon: Dann FORBES Hummer, MD;  Location: [facility] OR;  Service: General;  Laterality: N/A;  ERCP N/A 06/03/2013  Procedure: ENDOSCOPIC RETROGRADE CHOLANGIOPANCREATOGRAPHY (ERCP);  Surgeon: Gwendlyn ONEIDA Buddy, MD;  Location[facility] ENDOSCOPY;  Service: Endoscopy;  Laterality: N/A; Leita SAILOR., M.A. CCC-SLP Acute Rehabilitation Services Office: [phone] Secure chat preferred 11/06/2023, 11:13 AM  DG Chest Portable 1 View Result Date: 11/04/2023 EXAM: 1 VIEW XRAY OF THE CHEST 11/04/2023 06:24:00 AM COMPARISON: AP radiograph of the chest dated 10/28/2023. CLINICAL HISTORY: SOB. Respiratory  distress/ SOB. Hx COPD. FINDINGS: LUNGS AND PLEURA: Extensive bullous emphysema present with irregular reticular scarring, similar to the prior exam. No focal pulmonary opacity. No pulmonary edema. No pleural effusion. No pneumothorax. HEART AND MEDIASTINUM: No acute abnormality of the cardiac and mediastinal silhouettes. BONES AND SOFT TISSUES: No acute osseous abnormality. IMPRESSION: 1. Extensive bullous emphysema with irregular reticular scarring, similar to the prior exam. Electronically signed by: Evalene Coho MD 11/04/2023 06:59 AM EDT RP Workstation: [id number]     TODAY-DAY OF DISCHARGE:  Subjective:   Blake Mcknight today has no headache,no chest abdominal pain,no new weakness tingling or numbness, feels much better wants to go home today.   Objective:   Blood pressure 130/89, pulse 71, temperature 98.5 F (36.9 C), temperature source Oral, resp. rate 15, height 5' 6 (1.676 m), weight 63.1 kg, SpO2 100%.  Intake/Output Summary (Last 24 hours) at 11/29/2023 0852 Last data filed at 11/29/2023 0350 Gross per 24 hour  Intake --  Output 1650 ml  Net -1650 ml   Filed Weights   11/27/23 0151  Weight: 63.1 kg    Exam: Awake Alert, Oriented *3, No new F.N deficits, Normal affect [place].AT,PERRAL Supple Neck,No JVD, No cervical lymphadenopathy appriciated.  Symmetrical Chest wall movement, Good air movement bilaterally, CTAB RRR,No Gallops,Rubs or  new Murmurs, No Parasternal Heave +ve B.Sounds, Abd Soft, Non tender, No organomegaly appriciated, No rebound -guarding or rigidity. No Cyanosis, Clubbing or edema, No new Rash or bruise   PERTINENT RADIOLOGIC STUDIES: No results found.   PERTINENT LAB RESULTS: CBC: Recent Labs    11/27/23 0155 11/27/23 0211 11/28/23 0418  WBC 12.7*  --  9.5  HGB 11.9* 12.2* 10.4*  HCT 37.8* 36.0* 31.7*  PLT 345  --  303   CMET CMP     Component Value Date/Time   NA 136 11/28/2023 0418   K 4.5 11/28/2023 0418   CL 102 11/28/2023  0418   CO2 22 11/28/2023 0418   GLUCOSE 170 (H) 11/28/2023 0418   BUN 7 (L) 11/28/2023 0418   CREATININE 0.61 11/28/2023 0418   CREATININE 0.75 10/21/2022 1320   CALCIUM  8.8 (L) 11/28/2023 0418   PROT 6.9 11/27/2023 0155   ALBUMIN 2.9 (L) 11/27/2023 0155   AST 20 11/27/2023 0155   AST 10 (L) 10/21/2022 1320   ALT 12 11/27/2023 0155   ALT 19 10/21/2022 1320   ALKPHOS 81 11/27/2023 0155   BILITOT 0.8 11/27/2023 0155   BILITOT 0.3 10/21/2022 1320   GFRNONAA >60 11/28/2023 0418   GFRNONAA >60 10/21/2022 1320    GFR Estimated Creatinine Clearance: 81.1 mL/min (by C-G formula based on SCr of 0.61 mg/dL). No results for input(s): LIPASE, AMYLASE in the last 72 hours. No results for input(s): CKTOTAL, CKMB, CKMBINDEX, TROPONINI in the last 72 hours. Invalid input(s): POCBNP No results for input(s): DDIMER in the last 72 hours. No results for input(s): HGBA1C in the last 72 hours. No results for input(s): CHOL, HDL, LDLCALC, TRIG, CHOLHDL, LDLDIRECT in the last 72 hours. No results for input(s): TSH, T4TOTAL, T3FREE, THYROIDAB in the last 72 hours.  Invalid input(s): FREET3 No results for input(s): VITAMINB12, FOLATE, FERRITIN, TIBC, IRON, RETICCTPCT in the last 72 hours. Coags: No results for input(s): INR in the last 72 hours.  Invalid input(s): PT Microbiology: No results found for this or any previous visit (from the past 240 hours).  FURTHER DISCHARGE INSTRUCTIONS:  Get Medicines reviewed and adjusted: Please take all your medications with you for your next visit with your Primary MD  Laboratory/radiological data: Please request your Primary MD to go over all hospital tests and procedure/radiological results at the follow up, please ask your Primary MD to get all Hospital records sent to his/her office.  In some cases, they will be blood work, cultures and biopsy results pending at the time of your discharge. Please  request that your primary care M.D. goes through all the records of your hospital data and follows up on these results.  Also Note the following: If you experience worsening of your admission symptoms, develop shortness of breath, life threatening emergency, suicidal or homicidal thoughts you must seek medical attention immediately by calling 911 or calling your MD immediately  if symptoms less severe.  You must read complete instructions/literature along with all the possible adverse reactions/side effects for all the Medicines you take and that have been prescribed to you. Take any new Medicines after you have completely understood and accpet all the possible adverse reactions/side effects.   Do not drive when taking Pain medications or sleeping medications (Benzodaizepines)  Do not take more than prescribed Pain, Sleep and Anxiety Medications. It is not advisable to combine anxiety,sleep and pain medications without talking with your primary care practitioner  Special Instructions: If you have smoked or chewed Tobacco  in the last 2 yrs please stop smoking, stop any regular Alcohol  and or any Recreational drug use.  Wear Seat belts while driving.  Please note: You were cared for by a hospitalist during your hospital stay. Once you are discharged, your primary care physician will handle any further medical issues. Please note that NO REFILLS for any discharge medications will be authorized once you are discharged, as it is imperative that you return to your primary care physician (or establish a relationship with a primary care physician if you do not have one) for your post hospital discharge needs so that they can reassess your need for medications and monitor your lab values.  Total Time spent coordinating discharge including counseling, education and face to face time equals greater than 30 minutes.  SignedBETHA Donalda Applebaum 11/29/2023 8:52 AM

## 2023-11-29 NOTE — Plan of Care (Signed)
 Problem: Education: Goal: Ability to describe self-care measures that may prevent or decrease complications (Diabetes Survival Skills Education) will improve 11/29/2023 0856 by Alveria Reus, Gladis, RN Outcome: Completed/Met 11/29/2023 0855 by Alveria Reus Gladis, RN Outcome: Adequate for Discharge Goal: Individualized Educational Video(s) 11/29/2023 0856 by Alveria Reus, Gladis, RN Outcome: Completed/Met 11/29/2023 0855 by Alveria Reus, Gladis, RN Outcome: Adequate for Discharge   Problem: Coping: Goal: Ability to adjust to condition or change in health will improve 11/29/2023 0856 by Alveria Reus, Gladis, RN Outcome: Completed/Met 11/29/2023 0855 by Alveria Reus Gladis, RN Outcome: Adequate for Discharge   Problem: Fluid Volume: Goal: Ability to maintain a balanced intake and output will improve 11/29/2023 0856 by Alveria Reus, Gladis, RN Outcome: Completed/Met 11/29/2023 0855 by Alveria Reus Gladis, RN Outcome: Adequate for Discharge   Problem: Health Behavior/Discharge Planning: Goal: Ability to identify and utilize available resources and services will improve 11/29/2023 0856 by Alveria Reus Gladis, RN Outcome: Completed/Met 11/29/2023 0855 by Alveria Reus, Gladis, RN Outcome: Adequate for Discharge Goal: Ability to manage health-related needs will improve 11/29/2023 0856 by Alveria Reus, Gladis, RN Outcome: Completed/Met 11/29/2023 0855 by Alveria Reus Gladis, RN Outcome: Adequate for Discharge   Problem: Metabolic: Goal: Ability to maintain appropriate glucose levels will improve 11/29/2023 0856 by Alveria Reus, Gladis, RN Outcome: Completed/Met 11/29/2023 0855 by Alveria Reus, Gladis, RN Outcome: Adequate for Discharge   Problem: Nutritional: Goal: Maintenance of adequate nutrition will improve 11/29/2023 0856 by Alveria Reus, Gladis, RN Outcome: Completed/Met 11/29/2023 0855 by Alveria Reus, Gladis, RN Outcome: Adequate for Discharge Goal: Progress toward achieving an optimal  weight will improve 11/29/2023 0856 by Alveria Reus, Gladis, RN Outcome: Completed/Met 11/29/2023 0855 by Alveria Reus, Gladis, RN Outcome: Adequate for Discharge   Problem: Skin Integrity: Goal: Risk for impaired skin integrity will decrease 11/29/2023 0856 by Alveria Reus, Gladis, RN Outcome: Completed/Met 11/29/2023 0855 by Alveria Reus, Gladis, RN Outcome: Adequate for Discharge   Problem: Tissue Perfusion: Goal: Adequacy of tissue perfusion will improve 11/29/2023 0856 by Alveria Reus, Gladis, RN Outcome: Completed/Met 11/29/2023 0855 by Alveria Reus Gladis, RN Outcome: Adequate for Discharge   Problem: Education: Goal: Knowledge of General Education information will improve Description: Including pain rating scale, medication(s)/side effects and non-pharmacologic comfort measures 11/29/2023 0856 by Alveria Reus, Gladis, RN Outcome: Completed/Met 11/29/2023 0855 by Alveria Reus Gladis, RN Outcome: Adequate for Discharge   Problem: Health Behavior/Discharge Planning: Goal: Ability to manage health-related needs will improve 11/29/2023 0856 by Alveria Reus, Gladis, RN Outcome: Completed/Met 11/29/2023 0855 by Alveria Reus Gladis, RN Outcome: Adequate for Discharge   Problem: Clinical Measurements: Goal: Ability to maintain clinical measurements within normal limits will improve 11/29/2023 0856 by Alveria Reus, Gladis, RN Outcome: Completed/Met 11/29/2023 0855 by Alveria Reus, Gladis, RN Outcome: Adequate for Discharge Goal: Will remain free from infection 11/29/2023 0856 by Alveria Reus, Gladis, RN Outcome: Completed/Met 11/29/2023 0855 by Alveria Reus, Gladis, RN Outcome: Adequate for Discharge Goal: Diagnostic test results will improve 11/29/2023 0856 by Alveria Reus Gladis, RN Outcome: Completed/Met 11/29/2023 0855 by Alveria Reus, Gladis, RN Outcome: Adequate for Discharge Goal: Respiratory complications will improve 11/29/2023 0856 by Alveria Reus Gladis, RN Outcome:  Completed/Met 11/29/2023 0855 by Alveria Reus, Gladis, RN Outcome: Adequate for Discharge Goal: Cardiovascular complication will be avoided 11/29/2023 0856 by Alveria Reus Gladis, RN Outcome: Completed/Met 11/29/2023 0855 by Alveria Reus Gladis, RN Outcome: Adequate for Discharge   Problem: Activity: Goal: Risk for activity intolerance will decrease 11/29/2023 0856 by Alveria Reus, Gladis, RN Outcome: Completed/Met 11/29/2023 [phone]  by Alveria Reus, Gladis, RN Outcome: Adequate for Discharge   Problem: Nutrition: Goal: Adequate nutrition will be maintained 11/29/2023 0856 by Alveria Reus, Gladis, RN Outcome: Completed/Met 11/29/2023 0855 by Alveria Reus, Gladis, RN Outcome: Adequate for Discharge   Problem: Coping: Goal: Level of anxiety will decrease 11/29/2023 0856 by Alveria Reus, Gladis, RN Outcome: Completed/Met 11/29/2023 0855 by Alveria Reus, Gladis, RN Outcome: Adequate for Discharge   Problem: Elimination: Goal: Will not experience complications related to bowel motility 11/29/2023 0856 by Alveria Reus, Gladis, RN Outcome: Completed/Met 11/29/2023 0855 by Alveria Reus, Gladis, RN Outcome: Adequate for Discharge Goal: Will not experience complications related to urinary retention 11/29/2023 0856 by Alveria Reus Gladis, RN Outcome: Completed/Met 11/29/2023 0855 by Alveria Reus Gladis, RN Outcome: Adequate for Discharge   Problem: Pain Managment: Goal: General experience of comfort will improve and/or be controlled 11/29/2023 0856 by Alveria Reus, Gladis, RN Outcome: Completed/Met 11/29/2023 0855 by Alveria Reus Gladis, RN Outcome: Adequate for Discharge   Problem: Safety: Goal: Ability to remain free from injury will improve 11/29/2023 0856 by Alveria Reus Gladis, RN Outcome: Completed/Met 11/29/2023 0855 by Alveria Reus, Gladis, RN Outcome: Adequate for Discharge   Problem: Skin Integrity: Goal: Risk for impaired skin integrity will decrease 11/29/2023 0856 by Alveria Reus Gladis,  RN Outcome: Completed/Met 11/29/2023 0855 by Alveria Reus Gladis, RN Outcome: Adequate for Discharge

## 2023-11-29 NOTE — Plan of Care (Signed)
  Problem: Fluid Volume: Goal: Ability to maintain a balanced intake and output will improve Outcome: Progressing   Problem: Health Behavior/Discharge Planning: Goal: Ability to manage health-related needs will improve Outcome: Progressing   Problem: Nutritional: Goal: Maintenance of adequate nutrition will improve Outcome: Progressing   Problem: Skin Integrity: Goal: Risk for impaired skin integrity will decrease Outcome: Progressing   Problem: Education: Goal: Knowledge of General Education information will improve Description: Including pain rating scale, medication(s)/side effects and non-pharmacologic comfort measures Outcome: Progressing   Problem: Clinical Measurements: Goal: Diagnostic test results will improve Outcome: Progressing Goal: Respiratory complications will improve Outcome: Progressing

## 2023-11-30 ENCOUNTER — Encounter (HOSPITAL_COMMUNITY): Admission: RE | Admit: 2023-11-30 | Source: Ambulatory Visit

## 2023-12-08 ENCOUNTER — Ambulatory Visit (HOSPITAL_COMMUNITY)
Admission: RE | Admit: 2023-12-08 | Discharge: 2023-12-08 | Disposition: A | Discharge status: Home / Self Care | Source: Ambulatory Visit | Attending: Emergency Medicine | Admitting: Emergency Medicine

## 2023-12-08 DIAGNOSIS — Z85118 Personal history of other malignant neoplasm of bronchus and lung: Secondary | ICD-10-CM | POA: Insufficient documentation

## 2023-12-08 DIAGNOSIS — C787 Secondary malignant neoplasm of liver and intrahepatic bile duct: Secondary | ICD-10-CM | POA: Diagnosis not present

## 2023-12-08 DIAGNOSIS — R918 Other nonspecific abnormal finding of lung field: Secondary | ICD-10-CM | POA: Diagnosis not present

## 2023-12-08 LAB — GLUCOSE, CAPILLARY: Glucose-Capillary: 88 mg/dL (ref 70–99)

## 2023-12-08 MED ORDER — FLUDEOXYGLUCOSE F - 18 (FDG) INJECTION
7.4000 | Freq: Once | INTRAVENOUS | Status: AC | PRN
Start: 2023-12-08 — End: 2023-12-08
  Administered 2023-12-08: 7.4 via INTRAVENOUS

## 2023-12-16 ENCOUNTER — Ambulatory Visit: Admitting: Primary Care

## 2023-12-16 ENCOUNTER — Encounter: Payer: Self-pay | Admitting: Primary Care

## 2023-12-16 DIAGNOSIS — R0902 Hypoxemia: Secondary | ICD-10-CM

## 2023-12-16 DIAGNOSIS — J449 Chronic obstructive pulmonary disease, unspecified: Secondary | ICD-10-CM

## 2023-12-16 DIAGNOSIS — J9611 Chronic respiratory failure with hypoxia: Secondary | ICD-10-CM

## 2023-12-16 DIAGNOSIS — J439 Emphysema, unspecified: Secondary | ICD-10-CM

## 2023-12-16 NOTE — Progress Notes (Deleted)
 @Patient  ID: Blake Mcknight, male    DOB: [birth year], 68 y.o.   MRN: [id number]  No chief complaint on file.   Referring provider: [name], MD  HPI:   12/16/2023 Discussed the use of AI scribe software for clinical note transcription with the patient, who gave verbal consent to proceed.  History of Present Illness   Multiple hospitalizations for COPD. Recently admitted 11/27/23-11/29/23 for acute on chronic respiratory failure secondary to COPD exacerbations and PNA. Improved with steroids, broncholidators and antibiotics. Briefly required BIPAP weaning to 2L.     No Known Allergies  Immunization History  Administered Date(s) Administered   Fluad Trivalent(High Dose 65+) 12/06/2022   Influenza Split 12/15/2012   Influenza Whole 11/18/2021   Influenza,inj,Quad PF,6+ Mos 01/21/2018   PFIZER Comirnaty(Gray Top)Covid-19 Tri-Sucrose Vaccine 08/04/2020   Pneumococcal Polysaccharide-23 06/08/2013    Past Medical History:  Diagnosis Date   Abdominal pain 06/03/2013   Chest pain 08/10/2023   COPD (chronic obstructive pulmonary disease) (HCC)    with bullous emphysema.    COPD exacerbation (HCC) 08/09/2023   Heavy cigarette smoker before 2003   pt claims only 10 cigs per day, never heavier amounts.    HLD (hyperlipidemia)    Hypertension    Schizophrenia (HCC) 06/28/2013   This is a chronic condition and he lives with family    Tobacco History: Social History   Tobacco Use  Smoking Status Former   Current packs/day: 1.00   Average packs/day: 1 pack/day for 41.0 years (41.0 ttl pk-yrs)   Types: Cigarettes  Smokeless Tobacco Never  Tobacco Comments   Quit 7 - 8 months ago [facility] 07/17/21   Counseling given: Not Answered Tobacco comments: Quit 7 - 8 months ago [facility] 07/17/21   Outpatient Medications Prior to Visit  Medication Sig Dispense Refill   acetaminophen  (TYLENOL ) 500 MG tablet Take 2 tablets (1,000 mg total) by mouth every 8 (eight) hours. (Patient  taking differently: Take 1,000 mg by mouth every 8 (eight) hours as needed for mild pain (pain score 1-3) or headache.) 30 tablet 0   albuterol  (VENTOLIN  HFA) 108 (90 Base) MCG/ACT inhaler Inhale 2 puffs into the lungs every 4 (four) hours as needed for wheezing or shortness of breath. 1 each 1   apixaban  (ELIQUIS ) 5 MG TABS tablet Take 1 tablet (5 mg total) by mouth 2 (two) times daily.     arformoterol  (BROVANA ) 15 MCG/2ML NEBU Take 2 mLs (15 mcg total) by nebulization 2 (two) times daily. 120 mL 4   atorvastatin  (LIPITOR) 10 MG tablet Take 1 tablet (10 mg total) by mouth at bedtime. 30 tablet 1   benztropine  (COGENTIN ) 2 MG tablet Take 1 tablet (2 mg total) by mouth 2 (two) times daily. 180 tablet 1   carvedilol  (COREG ) 3.125 MG tablet Take 2 tablets (6.25 mg total) by mouth 2 (two) times daily with a meal.     cetirizine (ZYRTEC) 10 MG tablet Take 10 mg by mouth daily as needed for allergies or rhinitis.     cholecalciferol  (VITAMIN D ) 25 MCG (1000 UNIT) tablet Take 5,000 Units by mouth every morning.     cyanocobalamin  1000 MCG tablet Take 1,000 mcg by mouth daily.     famotidine  (PEPCID ) 40 MG tablet Take 40 mg by mouth 2 (two) times daily.     feeding supplement, GLUCERNA SHAKE, (GLUCERNA SHAKE) LIQD Take 237 mLs by mouth 3 (three) times daily between meals.     hydrOXYzine  (ATARAX ) 25 MG tablet Take 1  tablet (25 mg total) by mouth 3 (three) times daily as needed for anxiety. 30 tablet 0   ipratropium-albuterol  (DUONEB) 0.5-2.5 (3) MG/3ML SOLN Take 3 mLs by nebulization every 4 (four) hours. (Patient taking differently: Take 3 mLs by nebulization every 4 (four) hours as needed.)     JANUVIA 100 MG tablet Take 100 mg by mouth every evening.     lidocaine  (LIDODERM ) 5 % Place 1 patch onto the skin daily. Remove & Discard patch within 12 hours or as directed by MD 30 patch 0   methocarbamol  (ROBAXIN ) 750 MG tablet Take 1 tablet (750 mg total) by mouth every 8 (eight) hours as needed for muscle  spasms. 30 tablet 0   mirtazapine  (REMERON ) 30 MG tablet Take 1 tablet (30 mg total) by mouth at bedtime as needed (insomnia). 90 tablet 1   multivitamin (ONE-A-DAY MEN'S) TABS tablet Take 1 tablet by mouth daily.     OXYGEN  Inhale 2 L/min into the lungs continuous.      polyethylene glycol (MIRALAX  / GLYCOLAX ) 17 g packet Take 17 g by mouth daily. 14 each 0   predniSONE  (DELTASONE ) 20 MG tablet Take 40 mg by mouth daily for 3 days, then 30 mg by mouth daily for 3 days, then go back on your usual maintenance regimen of prednisone  20 mg by mouth daily. 30 tablet 1   risperiDONE  (RISPERDAL ) 3 MG tablet Take 1 tablet (3 mg total) by mouth 2 (two) times daily. 180 tablet 1   sodium chloride  (OCEAN) 0.65 % SOLN nasal spray Place 1 spray into both nostrils as needed for congestion.     tamsulosin  (FLOMAX ) 0.4 MG CAPS capsule Take 0.4 mg by mouth in the morning and at bedtime.     traMADol  (ULTRAM ) 50 MG tablet Take 1 tablet (50 mg total) by mouth every 6 (six) hours as needed. 40 tablet 0   TRELEGY ELLIPTA  100-62.5-25 MCG/ACT AEPB Inhale 1 puff into the lungs daily.     No facility-administered medications prior to visit.      Review of Systems  Review of Systems   Physical Exam  There were no vitals taken for this visit. Physical Exam  ***  Lab Results:  CBC    Component Value Date/Time   WBC 9.5 11/28/2023 0418   RBC 3.80 (L) 11/28/2023 0418   HGB 10.4 (L) 11/28/2023 0418   HGB 13.5 10/21/2022 1320   HCT 31.7 (L) 11/28/2023 0418   PLT 303 11/28/2023 0418   PLT 281 10/21/2022 1320   MCV 83.4 11/28/2023 0418   MCH 27.4 11/28/2023 0418   MCHC 32.8 11/28/2023 0418   RDW 16.3 (H) 11/28/2023 0418   LYMPHSABS 3.0 11/27/2023 0155   MONOABS 1.3 (H) 11/27/2023 0155   EOSABS 0.6 (H) 11/27/2023 0155   BASOSABS 0.1 11/27/2023 0155    BMET    Component Value Date/Time   NA 136 11/28/2023 0418   K 4.5 11/28/2023 0418   CL 102 11/28/2023 0418   CO2 22 11/28/2023 0418   GLUCOSE  170 (H) 11/28/2023 0418   BUN 7 (L) 11/28/2023 0418   CREATININE 0.61 11/28/2023 0418   CREATININE 0.75 10/21/2022 1320   CALCIUM  8.8 (L) 11/28/2023 0418   GFRNONAA >60 11/28/2023 0418   GFRNONAA >60 10/21/2022 1320   GFRAA >60 09/05/2019 1233    BNP    Component Value Date/Time   BNP 52.7 11/27/2023 0155    ProBNP No results found for: PROBNP  Imaging: NM PET Image Restage (  PS) Skull Base to Thigh (F-18 FDG) Result Date: 12/10/2023 EXAM: PET AND CT SKULL BASE TO MID THIGH 12/08/2023 03:58:34 PM TECHNIQUE: RADIOPHARMACEUTICAL: 7.4 mCi F-18 FDG Uptake time 60 minutes. Glucose level 88 mg/dl. PET imaging was acquired from the base of the skull to the mid thighs. Non-contrast enhanced computed tomography was obtained for attenuation correction and anatomic localization. COMPARISON: CTA of the chest 09/30/2023. Most recent PET 07/24/2021. CLINICAL HISTORY: Right lower lobe mass, right upper lobe nodule. 7.45mCi F18 FDG IV RAC / CD ; FBS= 88mg /dL; Restaging Right lower lobe mass, right upper lobe nodule, History of lung cancer, Lung mass ; Prev. Pet 07/15/2021; eov Right lower lobe mass, right upper lobe nodule. 7.69mCi F18 FDG IV RAC / CD ; FBS= 88mg /dL; Restaging Right lower lobe mass, right upper lobe nodule, History of lung cancer, Lung mass ; Prev. Pet 07/15/2021; eov FINDINGS: HEAD AND NECK: No cervical nodal hypermetabolism. No cervical adenopathy. CHEST: Right lower lobe mass-like consolidation is progressive compared to the prior PET. Demonstrates moderate relatively diffuse hypermetabolism, most significant posteriorly at SUV 6.0. Increased in size and hypermetabolism since the prior PET. The nodal mass within the subcarinal station with extension into the azygoesophageal recess is progressive since the prior PET. Example at 2.4 x 4.2 cm on image 69 / 4. SUV 19.3 today. Compare 1.0 cm and SUV 20.2 on the prior. Advanced centrilobular and paraseptal emphysema. Aortic and coronary artery  calcification. Trace right pleural fluid. ABDOMEN AND PELVIS: Inferior right hepatic lobe hypermetabolic metastasis is new. Example at 3.1 x 2.2 cm and SUV 14.8 on image 111 / 4. No abdominal or pelvic nodal hypermetabolism. Cholecystectomy. Normal adrenal glands. Left renal low density lesions which are likely cysts 1.7 cm and do not warrant specific imaging follow up. Abdominal aortic atherosclerosis. Extensive colonic diverticulosis. BONES AND SOFT TISSUE: Lower anterior right rib hypermetabolic subacute fracture. New midthoracic loss of vertebral body height and hypermetabolism. Most significant at approximately T5, measuring SUV 6.5. IMPRESSION: 1. Progressive subcarinal nodal metastasis since the prior PET. 2. Increased right lower lobe mass-like consolidation with hypermetabolism. Favor residual neoplasm over superimposed pneumonia. 3. New right hepatic lobe metastasis. 4. loss of thoracic vertebral body height at multiple levels, most significant at approximately T5 with concurrent hypermetabolism. This could be due to osteopenia or metastasis. Consider further evaluation with pre and post-contrast thoracic spine MRI. 5. incidental findings, including: Aortic atherosclerosis ([id number]). Coronary artery atherosclerosis. Prostatomegaly. Tiny right pleural effusion. Electronically signed by: Rockey Kilts MD 12/10/2023 10:28 AM EDT RP Workstation: [id number]   DG Chest Portable 1 View Result Date: 11/27/2023 EXAM: 1 VIEW XRAY OF THE CHEST 11/27/2023 03:09:31 AM COMPARISON: 11/04/2023 CLINICAL HISTORY: SOB, decreased on right. FINDINGS: LUNGS AND PLEURA: Advanced emphysema with bullous change. Atelectasis or pneumonia in the bilateral cardiophrenic angles. No pleural effusion or pneumothorax. HEART AND MEDIASTINUM: No acute abnormality of the cardiac and mediastinal silhouettes. BONES AND SOFT TISSUES: No acute osseous abnormality. IMPRESSION: 1. Atelectasis or pneumonia in the bilateral cardiophrenic  angles. 2. Advanced emphysema with bullous change. Electronically signed by: Norman Gatlin MD 11/27/2023 03:33 AM EDT RP Workstation: [id number]     Assessment & Plan:   No problem-specific Assessment & Plan notes found for this encounter.   1. Chronic obstructive pulmonary disease, unspecified COPD type (HCC) (Primary)  2. Bullous emphysema (HCC)  3. Chronic hypoxemic respiratory failure (HCC)  4. Hypoxia   Assessment and Plan Assessment & Plan        Almarie LELON Ferrari, NP 12/16/2023

## 2024-01-01 ENCOUNTER — Ambulatory Visit: Admitting: Adult Health

## 2024-01-01 ENCOUNTER — Encounter: Payer: Self-pay | Admitting: Adult Health

## 2024-01-01 VITALS — BP 126/72 | HR 101 | Temp 97.7°F | Ht 65.0 in | Wt 145.0 lb

## 2024-01-01 DIAGNOSIS — J9611 Chronic respiratory failure with hypoxia: Secondary | ICD-10-CM

## 2024-01-01 DIAGNOSIS — C3431 Malignant neoplasm of lower lobe, right bronchus or lung: Secondary | ICD-10-CM

## 2024-01-01 DIAGNOSIS — I2699 Other pulmonary embolism without acute cor pulmonale: Secondary | ICD-10-CM | POA: Diagnosis not present

## 2024-01-01 DIAGNOSIS — Z7901 Long term (current) use of anticoagulants: Secondary | ICD-10-CM

## 2024-01-01 DIAGNOSIS — R918 Other nonspecific abnormal finding of lung field: Secondary | ICD-10-CM | POA: Diagnosis not present

## 2024-01-01 DIAGNOSIS — J449 Chronic obstructive pulmonary disease, unspecified: Secondary | ICD-10-CM

## 2024-01-01 DIAGNOSIS — Z23 Encounter for immunization: Secondary | ICD-10-CM | POA: Diagnosis not present

## 2024-01-01 DIAGNOSIS — R160 Hepatomegaly, not elsewhere classified: Secondary | ICD-10-CM

## 2024-01-01 LAB — BASIC METABOLIC PANEL WITH GFR
BUN: 8 mg/dL (ref 6–23)
CO2: 27 meq/L (ref 19–32)
Calcium: 9.4 mg/dL (ref 8.4–10.5)
Chloride: 98 meq/L (ref 96–112)
Creatinine, Ser: 0.69 mg/dL (ref 0.40–1.50)
GFR: 96.71 mL/min (ref >60.00)
Glucose, Bld: 113 mg/dL — ABNORMAL HIGH (ref 70–99)
Potassium: 4.5 meq/L (ref 3.5–5.1)
Sodium: 135 meq/L (ref 135–145)

## 2024-01-01 LAB — CBC WITH DIFFERENTIAL/PLATELET
Basophils Absolute: 0.1 K/uL (ref 0.0–0.1)
Basophils Relative: 0.6 % (ref 0.0–3.0)
Eosinophils Absolute: 0.1 K/uL (ref 0.0–0.7)
Eosinophils Relative: 1.1 % (ref 0.0–5.0)
HCT: 41.7 % (ref 39.0–52.0)
Hemoglobin: 13.4 g/dL (ref 13.0–17.0)
Lymphocytes Relative: 8.6 % — ABNORMAL LOW (ref 12.0–46.0)
Lymphs Abs: 1.1 K/uL (ref 0.7–4.0)
MCHC: 32 g/dL (ref 30.0–36.0)
MCV: 82.9 fl (ref 78.0–100.0)
Monocytes Absolute: 0.3 K/uL (ref 0.1–1.0)
Monocytes Relative: 2.2 % — ABNORMAL LOW (ref 3.0–12.0)
Neutro Abs: 10.8 K/uL — ABNORMAL HIGH (ref 1.4–7.7)
Neutrophils Relative %: 87.5 % — ABNORMAL HIGH (ref 43.0–77.0)
Platelets: 290 K/uL (ref 150.0–400.0)
RBC: 5.03 Mil/uL (ref 4.22–5.81)
RDW: 18.6 % — ABNORMAL HIGH (ref 11.5–15.5)
WBC: 12.3 K/uL — ABNORMAL HIGH (ref 4.0–10.5)

## 2024-01-01 MED ORDER — TRELEGY ELLIPTA 100-62.5-25 MCG/ACT IN AEPB: 1.0000 | INHALATION_SPRAY | Freq: Every day | RESPIRATORY_TRACT | Status: AC

## 2024-01-01 NOTE — Progress Notes (Unsigned)
 @Patient  ID: Blake Mcknight, male    DOB: [birth year], 68 y.o.   MRN: [id number]  Chief Complaint  Patient presents with   Medical Management of Chronic Issues    PET scan    Referring provider: [name], MD  HPI: 68 year old male followed for severe COPD with emphysema and chronic respiratory failure and pulmonary nodular disease-abnormal CT chest with subcarinal adenopathy and waxing and waning nodularity, Presumed lung cancer with suspicious RLL mass s/p XRT , pulmonary embolism-diagnosed July 2025 Medical history significant for paranoid schizophrenia    TEST/EVENTS : reviewed 01/01/24  CT chest September 04, 2023 negative for PE, masslike consolidation right lower lobe no significant change since Aug 09, 2023, enlarged subcarinal lymphadenopathy, severe emphysema  CT chest September 30, 2023 acute right lower lobe pulmonary embolism with peripheral thrombus causing narrowing of the right lower lobe pulmonary artery, evidence of right heart strain, similar masslike consolidative findings in the right lower lobe, questionable 8 mm pancreatic cystic lesion similar to 3 years ago  Discussed the use of AI scribe software for clinical note transcription with the patient, who gave verbal consent to proceed.  History of Present Illness Blake Mcknight is a 68 year old male with COPD with emphysema and chronic respiratory failure. He is accompanied by his sister, Blake Mcknight, who is his primary caregiver and POA.  Patient has a history of paranoid schizophrenia.   He has been experiencing significant shortness of breath and breathing difficulties, leading to multiple hospitalizations over the past few months.  He has been admitted multiple times over the last several months with COPD exacerbation, acute on chronic respiratory failure and pulmonary embolism .  Most recently admitted 1 month ago for COPD exacerbation and possible pneumonia.  He was treated with empiric antibiotics.   Initially required BiPAP support.  He was discharged on home oxygen  at 2 L . In July, he was diagnosed with pulmonary embolism and has been on Eliquis  since then,   Patient has been followed for ongoing pulmonary nodularity.  PET scan was set up for December 08, 2023 that showed progressive subcarinal nodal metastasis, increased RLL hypermetabolism, new right hepatic lob mets, and vertebral hypermetabolism.  We discussed his results in detail with patient however he unable to understand, his sister/POA received the information.   His current medication regimen includes Brovana  and Pulmicort  nebulizer treatments twice daily, used once in the morning and once at night. Taking Trelegy daily . We discussed this is duplicate medications. He also uses albuterol  or DuoNeb as needed for acute shortness of breath. He quit smoking two and a half years ago.  Blake Mcknight reports that he has been more stable recently, not requiring hospitalization for a month.   He has been using Mucinex  and occasionally prednisone  to manage mucus and respiratory symptoms. Blake Mcknight also mentions a special mouthwash used in the hospital to clean his tongue after inhaler use, which she is trying to obtain over the counter.     No Known Allergies  Immunization History  Administered Date(s) Administered   Fluad Trivalent(High Dose 65+) 12/06/2022   Influenza Split 12/15/2012   Influenza Whole 11/18/2021   Influenza,inj,Quad PF,6+ Mos 01/21/2018   PFIZER Comirnaty(Gray Top)Covid-19 Tri-Sucrose Vaccine 08/04/2020   Pneumococcal Polysaccharide-23 06/08/2013    Past Medical History:  Diagnosis Date   Abdominal pain 06/03/2013   Chest pain 08/10/2023   COPD (chronic obstructive pulmonary disease) (HCC)    with bullous emphysema.    COPD exacerbation (HCC) 08/09/2023  Heavy cigarette smoker before 2003   pt claims only 10 cigs per day, never heavier amounts.    HLD (hyperlipidemia)    Hypertension    Schizophrenia (HCC)  06/28/2013   This is a chronic condition and he lives with family    Tobacco History: Social History   Tobacco Use  Smoking Status Former   Current packs/day: 1.00   Average packs/day: 1 pack/day for 41.0 years (41.0 ttl pk-yrs)   Types: Cigarettes  Smokeless Tobacco Never  Tobacco Comments   Quit 7 - 8 months ago [facility] 07/17/21   Counseling given: Not Answered Tobacco comments: Quit 7 - 8 months ago [facility] 07/17/21   Outpatient Medications Prior to Visit  Medication Sig Dispense Refill   acetaminophen  (TYLENOL ) 500 MG tablet Take 2 tablets (1,000 mg total) by mouth every 8 (eight) hours. (Patient taking differently: Take 1,000 mg by mouth every 8 (eight) hours as needed for mild pain (pain score 1-3) or headache.) 30 tablet 0   albuterol  (VENTOLIN  HFA) 108 (90 Base) MCG/ACT inhaler Inhale 2 puffs into the lungs every 4 (four) hours as needed for wheezing or shortness of breath. 1 each 1   apixaban  (ELIQUIS ) 5 MG TABS tablet Take 1 tablet (5 mg total) by mouth 2 (two) times daily.     arformoterol  (BROVANA ) 15 MCG/2ML NEBU Take 2 mLs (15 mcg total) by nebulization 2 (two) times daily. 120 mL 4   atorvastatin  (LIPITOR) 10 MG tablet Take 1 tablet (10 mg total) by mouth at bedtime. 30 tablet 1   benztropine  (COGENTIN ) 2 MG tablet Take 1 tablet (2 mg total) by mouth 2 (two) times daily. 180 tablet 1   carvedilol  (COREG ) 3.125 MG tablet Take 2 tablets (6.25 mg total) by mouth 2 (two) times daily with a meal.     cetirizine (ZYRTEC) 10 MG tablet Take 10 mg by mouth daily as needed for allergies or rhinitis.     cholecalciferol  (VITAMIN D ) 25 MCG (1000 UNIT) tablet Take 5,000 Units by mouth every morning.     cyanocobalamin  1000 MCG tablet Take 1,000 mcg by mouth daily.     famotidine  (PEPCID ) 40 MG tablet Take 40 mg by mouth 2 (two) times daily.     feeding supplement, GLUCERNA SHAKE, (GLUCERNA SHAKE) LIQD Take 237 mLs by mouth 3 (three) times daily between meals.     hydrOXYzine  (ATARAX ) 25 MG  tablet Take 1 tablet (25 mg total) by mouth 3 (three) times daily as needed for anxiety. 30 tablet 0   ipratropium-albuterol  (DUONEB) 0.5-2.5 (3) MG/3ML SOLN Take 3 mLs by nebulization every 4 (four) hours. (Patient taking differently: Take 3 mLs by nebulization every 4 (four) hours as needed.)     JANUVIA 100 MG tablet Take 100 mg by mouth every evening.     lidocaine  (LIDODERM ) 5 % Place 1 patch onto the skin daily. Remove & Discard patch within 12 hours or as directed by MD 30 patch 0   methocarbamol  (ROBAXIN ) 750 MG tablet Take 1 tablet (750 mg total) by mouth every 8 (eight) hours as needed for muscle spasms. 30 tablet 0   mirtazapine  (REMERON ) 30 MG tablet Take 1 tablet (30 mg total) by mouth at bedtime as needed (insomnia). 90 tablet 1   multivitamin (ONE-A-DAY MEN'S) TABS tablet Take 1 tablet by mouth daily.     OXYGEN  Inhale 2 L/min into the lungs continuous.      polyethylene glycol (MIRALAX  / GLYCOLAX ) 17 g packet Take 17 g by  mouth daily. 14 each 0   predniSONE  (DELTASONE ) 20 MG tablet Take 40 mg by mouth daily for 3 days, then 30 mg by mouth daily for 3 days, then go back on your usual maintenance regimen of prednisone  20 mg by mouth daily. 30 tablet 1   risperiDONE  (RISPERDAL ) 3 MG tablet Take 1 tablet (3 mg total) by mouth 2 (two) times daily. 180 tablet 1   sodium chloride  (OCEAN) 0.65 % SOLN nasal spray Place 1 spray into both nostrils as needed for congestion.     tamsulosin  (FLOMAX ) 0.4 MG CAPS capsule Take 0.4 mg by mouth in the morning and at bedtime.     traMADol  (ULTRAM ) 50 MG tablet Take 1 tablet (50 mg total) by mouth every 6 (six) hours as needed. 40 tablet 0   TRELEGY ELLIPTA  100-62.5-25 MCG/ACT AEPB Inhale 1 puff into the lungs daily.     No facility-administered medications prior to visit.     Review of Systems:   Constitutional:   No  weight loss, night sweats,  Fevers, chills, +fatigue, or  lassitude.  HEENT:   No headaches,  Difficulty swallowing,  Tooth/dental  problems, or  Sore throat,                No sneezing, itching, ear ache, nasal congestion, post nasal drip,   CV:  No chest pain,  Orthopnea, PND, swelling in lower extremities, anasarca, dizziness, palpitations, syncope.   GI  No heartburn, indigestion, abdominal pain, nausea, vomiting, diarrhea, change in bowel habits, loss of appetite, bloody stools.   Resp:  No chest wall deformity  Skin: no rash or lesions.  GU: no dysuria, change in color of urine, no urgency or frequency.  No flank pain, no hematuria   MS:  No joint pain or swelling.  No decreased range of motion.  No back pain.    Physical Exam  BP 126/72   Pulse (!) 101   Temp 97.7 F (36.5 C)   Ht 5' 5 (1.651 m)   Wt 145 lb (65.8 kg)   SpO2 98% Comment: ra  BMI 24.13 kg/m   GEN: A/Ox3; pleasant , NAD, well nourished    HEENT:  [place]/AT,  EACs-clear, TMs-wnl, NOSE-clear, THROAT-clear, no lesions, no postnasal drip or exudate noted.   NECK:  Supple w/ fair ROM; no JVD; normal carotid impulses w/o bruits; no thyromegaly or nodules palpated; no lymphadenopathy.    RESP  few trace rhonchi  no accessory muscle use, no dullness to percussion  CARD:  RRR, no m/r/g, no peripheral edema, pulses intact, no cyanosis or clubbing.  GI:   Soft & nt; nml bowel sounds; no organomegaly or masses detected.   Musco: Warm bil, no deformities or joint swelling noted.   Neuro: alert, no focal deficits noted.    Skin: Warm, no lesions or rashes    Lab Results:  CBC   BNP   ProBNP No results found for: PROBNP  Imaging: NM PET Image Restage (PS) Skull Base to Thigh (F-18 FDG) Result Date: 12/10/2023 EXAM: PET AND CT SKULL BASE TO MID THIGH 12/08/2023 03:58:34 PM TECHNIQUE: RADIOPHARMACEUTICAL: 7.4 mCi F-18 FDG Uptake time 60 minutes. Glucose level 88 mg/dl. PET imaging was acquired from the base of the skull to the mid thighs. Non-contrast enhanced computed tomography was obtained for attenuation correction and anatomic  localization. COMPARISON: CTA of the chest 09/30/2023. Most recent PET 07/24/2021. CLINICAL HISTORY: Right lower lobe mass, right upper lobe nodule. 7.69mCi F18 FDG IV RAC /  CD ; FBS= 88mg /dL; Restaging Right lower lobe mass, right upper lobe nodule, History of lung cancer, Lung mass ; Prev. Pet 07/15/2021; eov Right lower lobe mass, right upper lobe nodule. 7.8mCi F18 FDG IV RAC / CD ; FBS= 88mg /dL; Restaging Right lower lobe mass, right upper lobe nodule, History of lung cancer, Lung mass ; Prev. Pet 07/15/2021; eov FINDINGS: HEAD AND NECK: No cervical nodal hypermetabolism. No cervical adenopathy. CHEST: Right lower lobe mass-like consolidation is progressive compared to the prior PET. Demonstrates moderate relatively diffuse hypermetabolism, most significant posteriorly at SUV 6.0. Increased in size and hypermetabolism since the prior PET. The nodal mass within the subcarinal station with extension into the azygoesophageal recess is progressive since the prior PET. Example at 2.4 x 4.2 cm on image 69 / 4. SUV 19.3 today. Compare 1.0 cm and SUV 20.2 on the prior. Advanced centrilobular and paraseptal emphysema. Aortic and coronary artery calcification. Trace right pleural fluid. ABDOMEN AND PELVIS: Inferior right hepatic lobe hypermetabolic metastasis is new. Example at 3.1 x 2.2 cm and SUV 14.8 on image 111 / 4. No abdominal or pelvic nodal hypermetabolism. Cholecystectomy. Normal adrenal glands. Left renal low density lesions which are likely cysts 1.7 cm and do not warrant specific imaging follow up. Abdominal aortic atherosclerosis. Extensive colonic diverticulosis. BONES AND SOFT TISSUE: Lower anterior right rib hypermetabolic subacute fracture. New midthoracic loss of vertebral body height and hypermetabolism. Most significant at approximately T5, measuring SUV 6.5. IMPRESSION: 1. Progressive subcarinal nodal metastasis since the prior PET. 2. Increased right lower lobe mass-like consolidation with  hypermetabolism. Favor residual neoplasm over superimposed pneumonia. 3. New right hepatic lobe metastasis. 4. loss of thoracic vertebral body height at multiple levels, most significant at approximately T5 with concurrent hypermetabolism. This could be due to osteopenia or metastasis. Consider further evaluation with pre and post-contrast thoracic spine MRI. 5. incidental findings, including: Aortic atherosclerosis (icd10-i70.0). Coronary artery atherosclerosis. Prostatomegaly. Tiny right pleural effusion. Electronically signed by: Rockey Kilts MD 12/10/2023 10:28 AM EDT RP Workstation: [id number]    Administration History     None          Latest Ref Rng & Units 07/26/2013   10:51 AM  PFT Results  FVC-Pre L 1.95  P  FVC-Predicted Pre % 53  P  FVC-Post L 2.02  P  FVC-Predicted Post % 55  P  Pre FEV1/FVC % % 53  P  Post FEV1/FCV % % 52  P  FEV1-Pre L 1.03  P  FEV1-Predicted Pre % 35  P  FEV1-Post L 1.05  P    P Preliminary result    No results found for: NITRICOXIDE      Assessment & Plan:   Assessment and Plan Assessment & Plan Suspected metastatic lung cancer involving right lung, lymph nodes, and liver , spine  PET scan suggests possible progression of lung cancer with new concerns in the lymph nodes, right lower lobe, and liver/spine . Due to high anesthesia risk, a liver biopsy is the least invasive option to confirm diagnosis. Discussed biopsy risks, including bleeding and infection. If confirmed as metastatic cancer, treatment options include chemotherapy or immunotherapy, requiring further discussion with oncology. His understanding is limited, and decision-making involves his sister, who holds power of attorney. Order liver biopsy to confirm diagnosis. Hold Eliquis  48 hours prior to biopsy. Refer to oncology if biopsy confirms cancer for further management. Case and treatment plan discussed in detail with Dr. Shelah .   Chronic obstructive pulmonary disease with  chronic  respiratory failure and hypoxia  -prone to frequent hospitalizations.-currently stable.  Severe COPD with chronic respiratory failure and hypoxia. He is on home oxygen  at 2 liters. Recent exacerbations have led to multiple hospitalizations. Current treatment includes nebulized Brovana  and budesonide , but there is confusion about medication administration.  Discontinue Brovana  and budesonide . Restart Trelegy daily . Continue nebulized albuterol  or DuoNeb as needed every 4 hours for dyspnea. Administer flu shot today.  Pulmonary embolism on anticoagulation  -appears stable.  Pulmonary embolism is managed with Eliquis . Anticoagulation is well-managed but needs to be held prior to biopsy procedure. Continue Eliquis . Hold Eliquis  48 hours prior to liver biopsy.  Plan  Patient Instructions  Set up for Liver biopsy  Restart Trelegy 1 puff daily  Stop Brovana .  Albuterol  inhaler or DuoNeb nebulizer as needed Activity as tolerated Continue on Oxygen  2l/m  Mucinex  DM Twice daily  As needed   Flu shot today.  Follow up with Dr. Shelah or Jerrol Helmers NP  in 3-4 weeks and As needed   Please contact office for sooner follow up if symptoms do not improve or worsen or seek emergency care            Madelin Stank, NP 01/01/2024

## 2024-01-01 NOTE — Patient Instructions (Addendum)
 Set up for Liver biopsy  Restart Trelegy 1 puff daily  Stop Brovana .  Albuterol  inhaler or DuoNeb nebulizer as needed Activity as tolerated Continue on Oxygen  2l/m  Mucinex  DM Twice daily  As needed   Flu shot today.  Follow up with Dr. Shelah or Cyleigh Massaro NP  in 3-4 weeks and As needed   Please contact office for sooner follow up if symptoms do not improve or worsen or seek emergency care

## 2024-01-04 ENCOUNTER — Ambulatory Visit: Payer: Self-pay | Admitting: Adult Health

## 2024-01-04 ENCOUNTER — Telehealth: Payer: Self-pay | Admitting: Adult Health

## 2024-01-04 NOTE — Telephone Encounter (Signed)
 Have we heard anything regarding Liver biopsy. ?

## 2024-01-05 ENCOUNTER — Encounter: Payer: Self-pay | Admitting: Radiology

## 2024-01-05 NOTE — Telephone Encounter (Signed)
 The patient's liver biopsy has been sent for review. They will schedule directly with the patient once it's out of review.

## 2024-01-05 NOTE — Progress Notes (Signed)
 RE: IR CT LIVER MASS BIOPSY Received: Today Luverne Aran, MD  Daralene Ferol FALCON, RT PROCEDURE / BIOPSY REVIEW Date: 01/05/24  Requested Biopsy site: Liver Reason for request: Liver met Imaging review: Best seen on PET  Decision: Approved Imaging modality to perform: Ultrasound Schedule with: Moderate Sedation Schedule for: Any VIR  Additional comments:   Please contact me with questions, concerns, or if issue pertaining to this request arise.  Aran ONEIDA Luverne, MD [facility]       Previous Messages    ----- Message ----- From: Daralene Ferol FALCON, RT Sent: 01/05/2024   8:49 AM EDT To: Ir Procedure Requests Subject: IR CT LIVER MASS BIOPSY                        Procedure : IR CT LIVER MASS BIOPSY  Reason : suspected metastatic lung cancer with liver mets Dx: Mass of multiple sites of liver [R16.0 (ICD-10-CM)]  History :NM PET Image Restage (PS) Skull Base to Thigh (F-18 FDG) (Accession [id number]) (Order [id number]), CT Angio Chest PE W and/or Wo Contrast (Accession [id number]) (Order [id number]), CT Angio Chest PE W and/or Wo Contrast (Accession [id number]) (Order [id number]), CT ABDOMEN PELVIS W CONTRAST (Accession [id number]) (Order [id number]), CT Chest Wo Contrast (Accession [id number]) (Order [id number]), CT ABDOMEN PELVIS WO CONTRAST (Accession [id number]) (Order [id number]), CT Angio Chest PE W and/or Wo Contrast (Accession [id number]) (Order [id number]), CT Chest Wo Contrast (Accession [id number]) (Order [id number]), CT ABDOMEN PELVIS W CONTRAST (Accession [id number]) (Order [id number]), CT Angio Chest PE W and/or Wo Contrast (Accession [id number]) (Order [id number]), CT Chest Wo Contrast (Accession [id number]) (Order [id number])   Provider: Orlie Madelin RAMAN, NP  Provider contact ; [phone]

## 2024-01-12 DIAGNOSIS — N401 Enlarged prostate with lower urinary tract symptoms: Secondary | ICD-10-CM | POA: Diagnosis not present

## 2024-01-12 DIAGNOSIS — Z79891 Long term (current) use of opiate analgesic: Secondary | ICD-10-CM | POA: Diagnosis not present

## 2024-01-12 DIAGNOSIS — I119 Hypertensive heart disease without heart failure: Secondary | ICD-10-CM | POA: Diagnosis not present

## 2024-01-12 DIAGNOSIS — K2100 Gastro-esophageal reflux disease with esophagitis, without bleeding: Secondary | ICD-10-CM | POA: Diagnosis not present

## 2024-01-12 DIAGNOSIS — E1165 Type 2 diabetes mellitus with hyperglycemia: Secondary | ICD-10-CM | POA: Diagnosis not present

## 2024-01-12 DIAGNOSIS — E7800 Pure hypercholesterolemia, unspecified: Secondary | ICD-10-CM | POA: Diagnosis not present

## 2024-01-12 DIAGNOSIS — M1990 Unspecified osteoarthritis, unspecified site: Secondary | ICD-10-CM | POA: Diagnosis not present

## 2024-01-12 DIAGNOSIS — J441 Chronic obstructive pulmonary disease with (acute) exacerbation: Secondary | ICD-10-CM | POA: Diagnosis not present

## 2024-01-12 DIAGNOSIS — Z86711 Personal history of pulmonary embolism: Secondary | ICD-10-CM | POA: Diagnosis not present

## 2024-01-12 DIAGNOSIS — S22060A Wedge compression fracture of T7-T8 vertebra, initial encounter for closed fracture: Secondary | ICD-10-CM | POA: Diagnosis not present

## 2024-01-12 DIAGNOSIS — F319 Bipolar disorder, unspecified: Secondary | ICD-10-CM | POA: Diagnosis not present

## 2024-01-12 DIAGNOSIS — E559 Vitamin D deficiency, unspecified: Secondary | ICD-10-CM | POA: Diagnosis not present

## 2024-01-19 ENCOUNTER — Telehealth: Payer: Self-pay | Admitting: Adult Health

## 2024-01-19 NOTE — Telephone Encounter (Signed)
 Fax received from [facility] to perform a liver biopsy on patient.  Patient needs ok to hold Eliquis  2 days prior to procedure.  Patient was seen on 01/01/24 and copy of this encounter faxed to [facility].

## 2024-02-03 ENCOUNTER — Encounter (HOSPITAL_COMMUNITY): Payer: Self-pay | Admitting: Psychiatry

## 2024-02-03 ENCOUNTER — Telehealth (HOSPITAL_COMMUNITY): Admitting: Psychiatry

## 2024-02-03 ENCOUNTER — Other Ambulatory Visit: Payer: Self-pay | Admitting: Interventional Radiology

## 2024-02-03 VITALS — Wt 145.0 lb

## 2024-02-03 DIAGNOSIS — F200 Paranoid schizophrenia: Secondary | ICD-10-CM | POA: Diagnosis not present

## 2024-02-03 DIAGNOSIS — F5105 Insomnia due to other mental disorder: Secondary | ICD-10-CM

## 2024-02-03 DIAGNOSIS — F411 Generalized anxiety disorder: Secondary | ICD-10-CM

## 2024-02-03 DIAGNOSIS — Z01818 Encounter for other preprocedural examination: Secondary | ICD-10-CM

## 2024-02-03 DIAGNOSIS — F99 Mental disorder, not otherwise specified: Secondary | ICD-10-CM

## 2024-02-03 MED ORDER — RISPERIDONE 2 MG PO TABS: 2.0000 mg | ORAL_TABLET | Freq: Two times a day (BID) | ORAL | 0 refills | Status: AC

## 2024-02-03 MED ORDER — BENZTROPINE MESYLATE 1 MG PO TABS: 1.0000 mg | ORAL_TABLET | Freq: Two times a day (BID) | ORAL | 0 refills | Status: AC

## 2024-02-03 MED ORDER — MIRTAZAPINE 30 MG PO TABS: 30.0000 mg | ORAL_TABLET | Freq: Every evening | ORAL | 0 refills | Status: AC | PRN

## 2024-02-03 NOTE — H&P (Signed)
 Chief Complaint: Patient was seen in consultation today for suspected lung cancer with metastasis to liver, with consideration for liver lesion biopsy.  Referring Provider(s): Ms. Madelin Stank, NP   Supervising Physician: Luverne Aran  Patient Status: [facility] - Out-pt  Patient is DNR code status.  Patient currently has DNR order in place. Discussion with the patient and family regarding wishes.  {DNR For Procedure:27607}   History of Present Illness: Blake Mcknight is a 68 y.o. male  with PMHx notable for RLL lung nodule concerning for neoplasm, with suspected metastasis to liver, HTN, HLD, COPD, paranoid schizophrenia, and others as delineated below.  Per Ms. Parrett's progress notes on 10/17: Assessment & Plan Suspected metastatic lung cancer involving right lung, lymph nodes, and liver , spine  PET scan suggests possible progression of lung cancer with new concerns in the lymph nodes, right lower lobe, and liver/spine . Due to high anesthesia risk, a liver biopsy is the least invasive option to confirm diagnosis. Discussed biopsy risks, including bleeding and infection. If confirmed as metastatic cancer, treatment options include chemotherapy or immunotherapy, requiring further discussion with oncology. His understanding is limited, and decision-making involves his sister, who holds power of attorney. Order liver biopsy to confirm diagnosis. Hold Eliquis  48 hours prior to biopsy. Refer to oncology if biopsy confirms cancer for further management. Case and treatment plan discussed in detail with Dr. Shelah .   Interventional Radiology was requested for liver lesion biopsy. Request was reviewed and approved by Dr. Luverne. Patient is scheduled for same in IR today.   ***Patient is alert and laying in bed, calm.  Patient is currently without any significant complaints.  Patient denies any fevers, headache, chest pain, SOB, cough, abdominal pain, nausea, vomiting or  bleeding.     Past Medical History:  Diagnosis Date   Abdominal pain 06/03/2013   Chest pain 08/10/2023   COPD (chronic obstructive pulmonary disease) (HCC)    with bullous emphysema.    COPD exacerbation (HCC) 08/09/2023   Heavy cigarette smoker before 2003   pt claims only 10 cigs per day, never heavier amounts.    HLD (hyperlipidemia)    Hypertension    Schizophrenia (HCC) 06/28/2013   This is a chronic condition and he lives with family    Past Surgical History:  Procedure Laterality Date   CHOLECYSTECTOMY N/A 06/06/2013   Procedure: LAPAROSCOPIC CHOLECYSTECTOMY WITH ATTEMPTED INTRAOPERATIVE CHOLANGIOGRAM;  Surgeon: Dann FORBES Hummer, MD;  Location: [facility] OR;  Service: General;  Laterality: N/A;   ERCP N/A 06/03/2013   Procedure: ENDOSCOPIC RETROGRADE CHOLANGIOPANCREATOGRAPHY (ERCP);  Surgeon: Gwendlyn ONEIDA Buddy, MD;  Location: [facility] ENDOSCOPY;  Service: Endoscopy;  Laterality: N/A;    Allergies: Patient has no known allergies.  Medications: Prior to Admission medications   Medication Sig Start Date End Date Taking? Authorizing Provider  acetaminophen  (TYLENOL ) 500 MG tablet Take 2 tablets (1,000 mg total) by mouth every 8 (eight) hours. Patient taking differently: Take 1,000 mg by mouth every 8 (eight) hours as needed for mild pain (pain score 1-3) or headache. 08/14/23   Fausto Burnard LABOR, DO  albuterol  (VENTOLIN  HFA) 108 (90 Base) MCG/ACT inhaler Inhale 2 puffs into the lungs every 4 (four) hours as needed for wheezing or shortness of breath. 10/02/22   Bernard Drivers, MD  apixaban  (ELIQUIS ) 5 MG TABS tablet Take 1 tablet (5 mg total) by mouth 2 (two) times daily. 10/22/23   Shelah Lamar RAMAN, MD  arformoterol  (BROVANA ) 15 MCG/2ML NEBU Take 2 mLs (  15 mcg total) by nebulization 2 (two) times daily. 09/10/23   Will Almarie MATSU, MD  atorvastatin  (LIPITOR) 10 MG tablet Take 1 tablet (10 mg total) by mouth at bedtime. 08/14/23   Fausto Burnard LABOR, DO  benztropine  (COGENTIN ) 1 MG tablet Take  1 tablet (1 mg total) by mouth 2 (two) times daily. 02/03/24   Arfeen, Leni DASEN, MD  carvedilol  (COREG ) 3.125 MG tablet Take 2 tablets (6.25 mg total) by mouth 2 (two) times daily with a meal. 09/10/23   Will Almarie MATSU, MD  cetirizine (ZYRTEC) 10 MG tablet Take 10 mg by mouth daily as needed for allergies or rhinitis.    [provider]  cholecalciferol  (VITAMIN D ) 25 MCG (1000 UNIT) tablet Take 5,000 Units by mouth every morning.    [provider]  cyanocobalamin  1000 MCG tablet Take 1,000 mcg by mouth daily.    [provider]  famotidine  (PEPCID ) 40 MG tablet Take 40 mg by mouth 2 (two) times daily. 12/28/18   [provider]  feeding supplement, GLUCERNA SHAKE, (GLUCERNA SHAKE) LIQD Take 237 mLs by mouth 3 (three) times daily between meals. 08/14/23   Fausto Burnard LABOR, DO  Fluticasone -Umeclidin-Vilant (TRELEGY ELLIPTA ) 100-62.5-25 MCG/ACT AEPB Inhale 1 puff into the lungs daily. 01/01/24   Parrett, Madelin RAMAN, NP  hydrOXYzine  (ATARAX ) 25 MG tablet Take 1 tablet (25 mg total) by mouth 3 (three) times daily as needed for anxiety. 10/12/23   Sherrill Cable Latif, DO  ipratropium-albuterol  (DUONEB) 0.5-2.5 (3) MG/3ML SOLN Take 3 mLs by nebulization every 4 (four) hours. Patient taking differently: Take 3 mLs by nebulization every 4 (four) hours as needed. 08/14/23   Fausto Burnard A, DO  JANUVIA 100 MG tablet Take 100 mg by mouth every evening. 01/30/23   [provider]  lidocaine  (LIDODERM ) 5 % Place 1 patch onto the skin daily. Remove & Discard patch within 12 hours or as directed by MD 09/10/23   Will Almarie MATSU, MD  methocarbamol  (ROBAXIN ) 750 MG tablet Take 1 tablet (750 mg total) by mouth every 8 (eight) hours as needed for muscle spasms. 09/10/23   Will Almarie MATSU, MD  mirtazapine  (REMERON ) 30 MG tablet Take 1 tablet (30 mg total) by mouth at bedtime as needed (insomnia). 02/03/24 02/02/25  Arfeen, Leni DASEN, MD  multivitamin (ONE-A-DAY MEN'S)  TABS tablet Take 1 tablet by mouth daily.    [provider]  OXYGEN  Inhale 2 L/min into the lungs continuous.     [provider]  polyethylene glycol (MIRALAX  / GLYCOLAX ) 17 g packet Take 17 g by mouth daily. 09/10/23   Will Almarie MATSU, MD  predniSONE  (DELTASONE ) 20 MG tablet Take 40 mg by mouth daily for 3 days, then 30 mg by mouth daily for 3 days, then go back on your usual maintenance regimen of prednisone  20 mg by mouth daily. 11/29/23   Ghimire, Donalda HERO, MD  risperiDONE  (RISPERDAL ) 2 MG tablet Take 1 tablet (2 mg total) by mouth 2 (two) times daily. 02/03/24   Arfeen, Leni DASEN, MD  sodium chloride  (OCEAN) 0.65 % SOLN nasal spray Place 1 spray into both nostrils as needed for congestion.    [provider]  tamsulosin  (FLOMAX ) 0.4 MG CAPS capsule Take 0.4 mg by mouth in the morning and at bedtime. 07/23/20   [provider]  traMADol  (ULTRAM ) 50 MG tablet Take 1 tablet (50 mg total) by mouth every 6 (six) hours as needed. 10/21/23   Shelah Lamar RAMAN, MD  TRELEGY ELLIPTA  100-62.5-25 MCG/ACT AEPB Inhale 1 puff into the lungs daily. 08/29/23   [provider]     Family History  Problem Relation Age of Onset   Diabetes Mellitus II Mother    Diabetes Mother    Heart disease Father    Stroke Maternal Aunt    CAD Neg Hx     Social History   Socioeconomic History   Marital status: Single    Spouse name: Not on file   Number of children: Not on file   Years of education: Not on file   Highest education level: Not on file  Occupational History   Occupation: disabled  Tobacco Use   Smoking status: Former    Current packs/day: 1.00    Average packs/day: 1 pack/day for 41.0 years (41.0 ttl pk-yrs)    Types: Cigarettes   Smokeless tobacco: Never   Tobacco comments:    Quit 7 - 8 months ago [facility] 07/17/21  Vaping Use   Vaping status: Never Used  Substance and Sexual Activity   Alcohol use: No   Drug use: No   Sexual activity: Yes  Other  Topics Concern   Not on file  Social History Narrative   Not on file   Social Drivers of Health   Financial Resource Strain: Not on file  Food Insecurity: Patient Unable To Answer (11/27/2023)   Hunger Vital Sign    Worried About Running Out of Food in the Last Year: Patient unable to answer    Ran Out of Food in the Last Year: Patient unable to answer  Transportation Needs: Patient Unable To Answer (11/28/2023)   PRAPARE - Transportation    Lack of Transportation (Medical): Patient unable to answer    Lack of Transportation (Non-Medical): Patient unable to answer  Physical Activity: Not on file  Stress: Not on file  Social Connections: Unknown (11/27/2023)   Social Connection and Isolation Panel    Frequency of Communication with Friends and Family: Patient unable to answer    Frequency of Social Gatherings with Friends and Family: Patient unable to answer    Attends Religious Services: Patient unable to answer    Active Member of Clubs or Organizations: Patient unable to answer    Attends Banker Meetings: Not on file    Marital Status: Not on file  Recent Concern: Social Connections - Socially Isolated (10/27/2023)   Social Connection and Isolation Panel    Frequency of Communication with Friends and Family: Never    Frequency of Social Gatherings with Friends and Family: Never    Attends Religious Services: Never    Database Administrator or Organizations: No    Attends Banker Meetings: Never    Marital Status: Never married     Review of Systems: A 12 point ROS discussed and pertinent positives are indicated in the HPI above.  All other systems are negative.  Vital Signs: There were no vitals taken for this visit.  Advance Care Plan: The advanced care place/surrogate decision maker was discussed at the time of visit and the patient did not wish to discuss or was not able to name a surrogate decision maker or provide an advance care  plan.  Physical Exam  Imaging: No results found.  Labs:  CBC: Recent Labs    11/06/23 0218 11/27/23 0155 11/27/23 0211 11/28/23 0418 01/01/24 1254  WBC 10.5 12.7*  --  9.5 12.3*  HGB 10.9* 11.9* 12.2* 10.4* 13.4  HCT 33.0* 37.8*  36.0* 31.7* 41.7  PLT 340 345  --  303 290.0    COAGS: Recent Labs    10/08/23 1431 10/09/23 0351 10/27/23 0137  INR  --   --  1.3*  APTT 91* 42*  --     BMP: Recent Labs    11/05/23 0302 11/06/23 0218 11/27/23 0155 11/27/23 0211 11/28/23 0418 01/01/24 1254  NA 134* 139 135 134* 136 135  K 4.1 4.0 4.8 4.7 4.5 4.5  CL 97* 100 98  --  102 98  CO2 26 26 25   --  22 27  GLUCOSE 195* 155* 197*  --  170* 113*  BUN 9 7* <5*  --  7* 8  CALCIUM  9.0 8.8* 8.8*  --  8.8* 9.4  CREATININE 0.79 0.63 0.58*  --  0.61 0.69  GFRNONAA >60 >60 >60  --  >60  --     LIVER FUNCTION TESTS: Recent Labs    10/10/23 0247 10/11/23 0353 10/12/23 0350 11/27/23 0155  BILITOT 0.2 0.3 0.3 0.8  AST 16 18 18 20   ALT 17 20 24 12   ALKPHOS 57 63 60 81  PROT 6.3* 6.6 6.1* 6.9  ALBUMIN 2.5* 2.6* 2.4* 2.9*    TUMOR MARKERS: No results for input(s): AFPTM, CEA, CA199, CHROMGRNA in the last 8760 hours.  Assessment and Plan: Per Ms. Parrett's progress notes on 10/17: Assessment & Plan Suspected metastatic lung cancer involving right lung, lymph nodes, and liver , spine  PET scan suggests possible progression of lung cancer with new concerns in the lymph nodes, right lower lobe, and liver/spine . Due to high anesthesia risk, a liver biopsy is the least invasive option to confirm diagnosis. Discussed biopsy risks, including bleeding and infection. If confirmed as metastatic cancer, treatment options include chemotherapy or immunotherapy, requiring further discussion with oncology. His understanding is limited, and decision-making involves his sister, who holds power of attorney. Order liver biopsy to confirm diagnosis. Hold Eliquis  48 hours prior to  biopsy. Refer to oncology if biopsy confirms cancer for further management. Case and treatment plan discussed in detail with Dr. Shelah .  Patient presents for scheduled liver lesion biopsy in IR today.  ***Patient has been NPO since midnight.  All labs and medications are within acceptable parameters.  No pertinent allergies.   Risks and benefits of liver biopsy was discussed with the patient and/or patient's family including, but not limited to bleeding, infection, damage to adjacent structures or low yield requiring additional tests.  All of the questions were answered and there is agreement to proceed.  Consent signed and in chart.      Thank you for allowing our service to participate in RAM HAUGAN 's care.  Electronically Signed: Carlin DELENA Griffon, PA-C   02/03/2024, 11:33 AM      I spent a total of {New Out-Pt:[id number]} in face to face in clinical consultation, greater than 50% of which was counseling/coordinating care for suspected lung cancer with metastasis to liver, with consideration for liver lesion biopsy.

## 2024-02-03 NOTE — Progress Notes (Signed)
 [facility] MD Virtual Progress Note   Patient Location: Home Provider Location: Home Office  I connect with patient by video and verified that I am speaking with correct person by using two identifiers. I discussed the limitations of evaluation and management by telemedicine and the availability of in person appointments. I also discussed with the patient that there may be a patient responsible charge related to this service. The patient expressed understanding and agreed to proceed.  CONNY MOENING [id number] 68 y.o.  02/03/2024 9:59 AM  History of Present Illness:  Patient is evaluated by video session.  His sister helped him but patient appeared to be very sedated, superficially cooperative.  He is lying down on the bed.  When asked about how he is doing he replied I am fine.  His sister reported that he is not agitated but talk to himself.  Tried to talk to him few times but he he answer either yes or no.  Patient is on a nasal oxygen .  He is in the hospital multiple times since the last visit due to extubation of COPD and other health conditions.  He has limited ADLs.  He requires a lot of help from his sister.  He has mild tremors but sister reported Cogentin  helps.  He is on moderate dose of risperidone  along with mirtazapine  and Cogentin .  His attention concentration is poor.  His sister reported that he usually sits on recliner during the day and sometimes falls asleep.  He has COPD and sometimes he has difficulty sleeping at night because of shortness of breath.  His sister manages his pillbox.  Patient denies any active or passive suicidal thoughts or homicidal thoughts.  He noticed few times giggling and talking to himself.  His appetite is fair.  His weight is stable.  Sister denied that he uses illegal substances.  Past Psychiatric History: H/O schizophrenia and admitted at [facility] years ago. H/O paranoid, delusional, disorganized behavior and having mood  swings.   Past Medical History:  Diagnosis Date   Abdominal pain 06/03/2013   Chest pain 08/10/2023   COPD (chronic obstructive pulmonary disease) (HCC)    with bullous emphysema.    COPD exacerbation (HCC) 08/09/2023   Heavy cigarette smoker before 2003   pt claims only 10 cigs per day, never heavier amounts.    HLD (hyperlipidemia)    Hypertension    Schizophrenia (HCC) 06/28/2013   This is a chronic condition and he lives with family    Outpatient Encounter Medications as of 02/03/2024  Medication Sig   acetaminophen  (TYLENOL ) 500 MG tablet Take 2 tablets (1,000 mg total) by mouth every 8 (eight) hours. (Patient taking differently: Take 1,000 mg by mouth every 8 (eight) hours as needed for mild pain (pain score 1-3) or headache.)   albuterol  (VENTOLIN  HFA) 108 (90 Base) MCG/ACT inhaler Inhale 2 puffs into the lungs every 4 (four) hours as needed for wheezing or shortness of breath.   apixaban  (ELIQUIS ) 5 MG TABS tablet Take 1 tablet (5 mg total) by mouth 2 (two) times daily.   arformoterol  (BROVANA ) 15 MCG/2ML NEBU Take 2 mLs (15 mcg total) by nebulization 2 (two) times daily.   atorvastatin  (LIPITOR) 10 MG tablet Take 1 tablet (10 mg total) by mouth at bedtime.   benztropine  (COGENTIN ) 2 MG tablet Take 1 tablet (2 mg total) by mouth 2 (two) times daily.   carvedilol  (COREG ) 3.125 MG tablet Take 2 tablets (6.25 mg total) by mouth 2 (two)  times daily with a meal.   cetirizine (ZYRTEC) 10 MG tablet Take 10 mg by mouth daily as needed for allergies or rhinitis.   cholecalciferol  (VITAMIN D ) 25 MCG (1000 UNIT) tablet Take 5,000 Units by mouth every morning.   cyanocobalamin  1000 MCG tablet Take 1,000 mcg by mouth daily.   famotidine  (PEPCID ) 40 MG tablet Take 40 mg by mouth 2 (two) times daily.   feeding supplement, GLUCERNA SHAKE, (GLUCERNA SHAKE) LIQD Take 237 mLs by mouth 3 (three) times daily between meals.   Fluticasone -Umeclidin-Vilant (TRELEGY ELLIPTA ) 100-62.5-25 MCG/ACT AEPB  Inhale 1 puff into the lungs daily.   hydrOXYzine  (ATARAX ) 25 MG tablet Take 1 tablet (25 mg total) by mouth 3 (three) times daily as needed for anxiety.   ipratropium-albuterol  (DUONEB) 0.5-2.5 (3) MG/3ML SOLN Take 3 mLs by nebulization every 4 (four) hours. (Patient taking differently: Take 3 mLs by nebulization every 4 (four) hours as needed.)   JANUVIA 100 MG tablet Take 100 mg by mouth every evening.   lidocaine  (LIDODERM ) 5 % Place 1 patch onto the skin daily. Remove & Discard patch within 12 hours or as directed by MD   methocarbamol  (ROBAXIN ) 750 MG tablet Take 1 tablet (750 mg total) by mouth every 8 (eight) hours as needed for muscle spasms.   mirtazapine  (REMERON ) 30 MG tablet Take 1 tablet (30 mg total) by mouth at bedtime as needed (insomnia).   multivitamin (ONE-A-DAY MEN'S) TABS tablet Take 1 tablet by mouth daily.   OXYGEN  Inhale 2 L/min into the lungs continuous.    polyethylene glycol (MIRALAX  / GLYCOLAX ) 17 g packet Take 17 g by mouth daily.   predniSONE  (DELTASONE ) 20 MG tablet Take 40 mg by mouth daily for 3 days, then 30 mg by mouth daily for 3 days, then go back on your usual maintenance regimen of prednisone  20 mg by mouth daily.   risperiDONE  (RISPERDAL ) 3 MG tablet Take 1 tablet (3 mg total) by mouth 2 (two) times daily.   sodium chloride  (OCEAN) 0.65 % SOLN nasal spray Place 1 spray into both nostrils as needed for congestion.   tamsulosin  (FLOMAX ) 0.4 MG CAPS capsule Take 0.4 mg by mouth in the morning and at bedtime.   traMADol  (ULTRAM ) 50 MG tablet Take 1 tablet (50 mg total) by mouth every 6 (six) hours as needed.   TRELEGY ELLIPTA  100-62.5-25 MCG/ACT AEPB Inhale 1 puff into the lungs daily.   No facility-administered encounter medications on file as of 02/03/2024.    Recent Results (from the past 2160 hours)  Magnesium      Status: None   Collection Time: 11/05/23  5:29 PM  Result Value Ref Range   Magnesium  2.2 1.7 - 2.4 mg/dL    Comment: Performed at [facility], [address], [place], [place] [address]  Phosphorus     Status: None   Collection Time: 11/05/23  5:29 PM  Result Value Ref Range   Phosphorus 2.6 2.5 - 4.6 mg/dL    Comment: Performed at [facility], [address], [place], [place] [address]  CBC     Status: Abnormal   Collection Time: 11/06/23  2:18 AM  Result Value Ref Range   WBC 10.5 4.0 - 10.5 K/uL   RBC 4.05 (L) 4.22 - 5.81 MIL/uL   Hemoglobin 10.9 (L) 13.0 - 17.0 g/dL   HCT 66.9 (L) 60.9 - 47.9 %   MCV 81.5 80.0 - 100.0 fL   MCH 26.9 26.0 - 34.0 pg  MCHC 33.0 30.0 - 36.0 g/dL   RDW 84.4 88.4 - 84.4 %   Platelets 340 150 - 400 K/uL   nRBC 0.0 0.0 - 0.2 %    Comment: Performed at [facility], [address], [place], [place] [address]  Basic metabolic panel with GFR     Status: Abnormal   Collection Time: 11/06/23  2:18 AM  Result Value Ref Range   Sodium 139 135 - 145 mmol/L   Potassium 4.0 3.5 - 5.1 mmol/L   Chloride 100 98 - 111 mmol/L   CO2 26 22 - 32 mmol/L   Glucose, Bld 155 (H) 70 - 99 mg/dL    Comment: Glucose reference range applies only to samples taken after fasting for at least 8 hours.   BUN 7 (L) 8 - 23 mg/dL   Creatinine, Ser 9.36 0.61 - 1.24 mg/dL   Calcium  8.8 (L) 8.9 - 10.3 mg/dL   GFR, Estimated >39 >39 mL/min    Comment: (NOTE) Calculated using the CKD-EPI Creatinine Equation (2021)    Anion gap 13 5 - 15    Comment: Performed at [facility], [address], [place], [place] [address]  CBC with Differential     Status: Abnormal   Collection Time: 11/27/23  1:55 AM  Result Value Ref Range   WBC 12.7 (H) 4.0 - 10.5 K/uL   RBC 4.45 4.22 - 5.81 MIL/uL   Hemoglobin 11.9 (L) 13.0 - 17.0 g/dL   HCT 62.1 (L) 60.9 - 47.9 %   MCV 84.9 80.0 - 100.0 fL   MCH 26.7 26.0 - 34.0 pg   MCHC 31.5 30.0 - 36.0 g/dL   RDW 83.4 (H) 88.4 - 84.4 %   Platelets 345 150 - 400 K/uL   nRBC 0.0 0.0 - 0.2 %   Neutrophils Relative % 61 %   Neutro Abs 7.7 1.7 - 7.7 K/uL    Lymphocytes Relative 24 %   Lymphs Abs 3.0 0.7 - 4.0 K/uL   Monocytes Relative 10 %   Monocytes Absolute 1.3 (H) 0.1 - 1.0 K/uL   Eosinophils Relative 4 %   Eosinophils Absolute 0.6 (H) 0.0 - 0.5 K/uL   Basophils Relative 1 %   Basophils Absolute 0.1 0.0 - 0.1 K/uL   Immature Granulocytes 0 %   Abs Immature Granulocytes 0.05 0.00 - 0.07 K/uL    Comment: Performed at [facility], [address], [place], [place] [address]  Comprehensive metabolic panel     Status: Abnormal   Collection Time: 11/27/23  1:55 AM  Result Value Ref Range   Sodium 135 135 - 145 mmol/L   Potassium 4.8 3.5 - 5.1 mmol/L   Chloride 98 98 - 111 mmol/L   CO2 25 22 - 32 mmol/L   Glucose, Bld 197 (H) 70 - 99 mg/dL    Comment: Glucose reference range applies only to samples taken after fasting for at least 8 hours.   BUN <5 (L) 8 - 23 mg/dL   Creatinine, Ser 9.41 (L) 0.61 - 1.24 mg/dL   Calcium  8.8 (L) 8.9 - 10.3 mg/dL   Total Protein 6.9 6.5 - 8.1 g/dL   Albumin 2.9 (L) 3.5 - 5.0 g/dL   AST 20 15 - 41 U/L   ALT 12 0 - 44 U/L   Alkaline Phosphatase 81 38 - 126 U/L   Total Bilirubin 0.8 0.0 - 1.2 mg/dL   GFR, Estimated >39 >39 mL/min    Comment: (NOTE)  Calculated using the CKD-EPI Creatinine Equation (2021)    Anion gap 12 5 - 15    Comment: Performed at [facility], [address], [place], [place] [address]  Troponin I (High Sensitivity)     Status: None   Collection Time: 11/27/23  1:55 AM  Result Value Ref Range   Troponin I (High Sensitivity) 5 <18 ng/L    Comment: (NOTE) Elevated high sensitivity troponin I (hsTnI) values and significant  changes across serial measurements may suggest ACS but many other  chronic and acute conditions are known to elevate hsTnI results.  Refer to the Links section for chest pain algorithms and additional  guidance. Performed at [facility], [address], [place], [place] [address]   Brain natriuretic peptide     Status: None   Collection  Time: 11/27/23  1:55 AM  Result Value Ref Range   B Natriuretic Peptide 52.7 0.0 - 100.0 pg/mL    Comment: Performed at [facility], [address], [place], [place] [address]  I-Stat venous blood gas, [facility], [facility], [facility])     Status: Abnormal   Collection Time: 11/27/23  2:11 AM  Result Value Ref Range   pH, Ven 7.363 7.25 - 7.43   pCO2, Ven 54.9 44 - 60 mmHg   pO2, Ven 101 (H) 32 - 45 mmHg   Bicarbonate 31.3 (H) 20.0 - 28.0 mmol/L   TCO2 33 (H) 22 - 32 mmol/L   O2 Saturation 97 %   Acid-Base Excess 5.0 (H) 0.0 - 2.0 mmol/L   Sodium 134 (L) 135 - 145 mmol/L   Potassium 4.7 3.5 - 5.1 mmol/L   Calcium , Ion 1.18 1.15 - 1.40 mmol/L   HCT 36.0 (L) 39.0 - 52.0 %   Hemoglobin 12.2 (L) 13.0 - 17.0 g/dL   Sample type VENOUS   Troponin I (High Sensitivity)     Status: None   Collection Time: 11/27/23  4:33 AM  Result Value Ref Range   Troponin I (High Sensitivity) 7 <18 ng/L    Comment: (NOTE) Elevated high sensitivity troponin I (hsTnI) values and significant  changes across serial measurements may suggest ACS but many other  chronic and acute conditions are known to elevate hsTnI results.  Refer to the Links section for chest pain algorithms and additional  guidance. Performed at [facility], [address], [place], [place] [address]   Procalcitonin     Status: None   Collection Time: 11/27/23  4:33 AM  Result Value Ref Range   Procalcitonin <0.10 ng/mL    Comment:        Interpretation: PCT (Procalcitonin) <= 0.5 ng/mL: Systemic infection (sepsis) is not likely. Local bacterial infection is possible. (NOTE)       Sepsis PCT Algorithm           Lower Respiratory Tract                                      Infection PCT Algorithm    ----------------------------     ----------------------------         PCT < 0.25 ng/mL                PCT < 0.10 ng/mL          Strongly encourage             Strongly discourage  discontinuation of antibiotics    initiation of  antibiotics    ----------------------------     -----------------------------       PCT 0.25 - 0.50 ng/mL            PCT 0.10 - 0.25 ng/mL               OR       >80% decrease in PCT            Discourage initiation of                                            antibiotics      Encourage discontinuation           of antibiotics    ----------------------------     -----------------------------         PCT >= 0.50 ng/mL              PCT 0.26 - 0.50 ng/mL               AND        <80% decrease in PCT             Encourage initiation of                                             antibiotics       Encourage continuation           of antibiotics    ----------------------------     -----------------------------        PCT >= 0.50 ng/mL                  PCT > 0.50 ng/mL               AND         increase in PCT                  Strongly encourage                                      initiation of antibiotics    Strongly encourage escalation           of antibiotics                                     -----------------------------                                           PCT <= 0.25 ng/mL                                                 OR                                        >  80% decrease in PCT                                      Discontinue / Do not initiate                                             antibiotics  Performed at [facility], [address], [place], [place] [address]   CBG monitoring, ED     Status: Abnormal   Collection Time: 11/27/23 12:54 PM  Result Value Ref Range   Glucose-Capillary 142 (H) 70 - 99 mg/dL    Comment: Glucose reference range applies only to samples taken after fasting for at least 8 hours.  Glucose, capillary     Status: Abnormal   Collection Time: 11/27/23  9:07 PM  Result Value Ref Range   Glucose-Capillary 193 (H) 70 - 99 mg/dL    Comment: Glucose reference range applies only to samples taken after fasting for at least 8 hours.    Comment 1 Notify RN    Comment 2 Document in Chart   CBC     Status: Abnormal   Collection Time: 11/28/23  4:18 AM  Result Value Ref Range   WBC 9.5 4.0 - 10.5 K/uL   RBC 3.80 (L) 4.22 - 5.81 MIL/uL   Hemoglobin 10.4 (L) 13.0 - 17.0 g/dL   HCT 68.2 (L) 60.9 - 47.9 %   MCV 83.4 80.0 - 100.0 fL   MCH 27.4 26.0 - 34.0 pg   MCHC 32.8 30.0 - 36.0 g/dL   RDW 83.6 (H) 88.4 - 84.4 %   Platelets 303 150 - 400 K/uL   nRBC 0.0 0.0 - 0.2 %    Comment: Performed at [facility], [address] [place], [place] [address]  Basic metabolic panel     Status: Abnormal   Collection Time: 11/28/23  4:18 AM  Result Value Ref Range   Sodium 136 135 - 145 mmol/L   Potassium 4.5 3.5 - 5.1 mmol/L   Chloride 102 98 - 111 mmol/L   CO2 22 22 - 32 mmol/L   Glucose, Bld 170 (H) 70 - 99 mg/dL    Comment: Glucose reference range applies only to samples taken after fasting for at least 8 hours.   BUN 7 (L) 8 - 23 mg/dL   Creatinine, Ser 9.38 0.61 - 1.24 mg/dL   Calcium  8.8 (L) 8.9 - 10.3 mg/dL   GFR, Estimated >39 >39 mL/min    Comment: (NOTE) Calculated using the CKD-EPI Creatinine Equation (2021)    Anion gap 12 5 - 15    Comment: Performed at [facility], [address] [place], [place] [address]  Glucose, capillary     Status: Abnormal   Collection Time: 11/28/23  7:30 AM  Result Value Ref Range   Glucose-Capillary 141 (H) 70 - 99 mg/dL    Comment: Glucose reference range applies only to samples taken after fasting for at least 8 hours.  Glucose, capillary     Status: None   Collection Time: 11/28/23 12:35 PM  Result Value Ref Range   Glucose-Capillary 99 70 - 99 mg/dL    Comment: Glucose reference range applies only to samples taken after fasting for at least 8 hours.  Glucose,  capillary     Status: None   Collection Time: 11/28/23  4:31 PM  Result Value Ref Range   Glucose-Capillary 95 70 - 99 mg/dL    Comment: Glucose reference range applies only to samples taken after fasting  for at least 8 hours.  Glucose, capillary     Status: Abnormal   Collection Time: 11/28/23  9:29 PM  Result Value Ref Range   Glucose-Capillary 141 (H) 70 - 99 mg/dL    Comment: Glucose reference range applies only to samples taken after fasting for at least 8 hours.  Glucose, capillary     Status: Abnormal   Collection Time: 11/29/23  8:18 AM  Result Value Ref Range   Glucose-Capillary 136 (H) 70 - 99 mg/dL    Comment: Glucose reference range applies only to samples taken after fasting for at least 8 hours.  Glucose, capillary     Status: None   Collection Time: 12/08/23  2:37 PM  Result Value Ref Range   Glucose-Capillary 88 70 - 99 mg/dL    Comment: Glucose reference range applies only to samples taken after fasting for at least 8 hours.  CBC with Differential/Platelet     Status: Abnormal   Collection Time: 01/01/24 12:54 PM  Result Value Ref Range   WBC 12.3 (H) 4.0 - 10.5 K/uL   RBC 5.03 4.22 - 5.81 Mil/uL   Hemoglobin 13.4 13.0 - 17.0 g/dL   HCT 58.2 60.9 - 47.9 %   MCV 82.9 78.0 - 100.0 fl   MCHC 32.0 30.0 - 36.0 g/dL   RDW 81.3 (H) 88.4 - 84.4 %   Platelets 290.0 150.0 - 400.0 K/uL   Neutrophils Relative % 87.5 aH (H) 43.0 - 77.0 %   Lymphocytes Relative 8.6 (L) 12.0 - 46.0 %   Monocytes Relative 2.2 (L) 3.0 - 12.0 %   Eosinophils Relative 1.1 0.0 - 5.0 %   Basophils Relative 0.6 0.0 - 3.0 %   Neutro Abs 10.8 (H) 1.4 - 7.7 K/uL   Lymphs Abs 1.1 0.7 - 4.0 K/uL   Monocytes Absolute 0.3 0.1 - 1.0 K/uL   Eosinophils Absolute 0.1 0.0 - 0.7 K/uL   Basophils Absolute 0.1 0.0 - 0.1 K/uL  Basic metabolic panel with GFR     Status: Abnormal   Collection Time: 01/01/24 12:54 PM  Result Value Ref Range   Sodium 135 135 - 145 mEq/L   Potassium 4.5 3.5 - 5.1 mEq/L   Chloride 98 96 - 112 mEq/L   CO2 27 19 - 32 mEq/L   Glucose, Bld 113 (H) 70 - 99 mg/dL   BUN 8 6 - 23 mg/dL   Creatinine, Ser 9.30 0.40 - 1.50 mg/dL   GFR 03.28 >39.99 mL/min    Comment: Calculated using the  CKD-EPI Creatinine Equation (2021)   Calcium  9.4 8.4 - 10.5 mg/dL     Psychiatric Specialty Exam: Physical Exam  Review of Systems  Respiratory:         On nasal Oxygen     Weight 145 lb (65.8 kg).There is no height or weight on file to calculate BMI.  General Appearance: Fairly Groomed and lying on bed, somewhat sedated   Eye Contact:  fair, mostly close eyes  Speech:  Slow  Volume:  Decreased  Mood:  tired  Affect:  Labile  Thought Process:  Descriptions of Associations: Loose  Orientation:  Full (Time, Place, and Person)  Thought Content:  Rumination  Suicidal Thoughts:  No  Homicidal Thoughts:  No  Memory:  Immediate;   Fair Recent;   Fair Remote;   Fair  Judgement:  Fair  Insight:  Shallow  Psychomotor Activity:  Decreased and Tremor  Concentration:  Concentration: Fair and Attention Span: Fair  Recall:  [employer] of Knowledge:  Fair  Language:  Fair  Akathisia:  No  Handed:  Right  AIMS (if indicated):     Assets:  Communication Skills Desire for Improvement Housing Social Support  ADL's:  Intact  Cognition:  Impaired,  Mild  Sleep:  too much        No data to display          Assessment/Plan: Paranoid schizophrenia (HCC) - Plan: mirtazapine  (REMERON ) 30 MG tablet, risperiDONE  (RISPERDAL ) 2 MG tablet, benztropine  (COGENTIN ) 1 MG tablet  Insomnia due to other mental disorder - Plan: mirtazapine  (REMERON ) 30 MG tablet  Generalized anxiety disorder - Plan: mirtazapine  (REMERON ) 30 MG tablet  Patient is 68 year old African-American male with a history of schizophrenia chronic paranoid type, insomnia, generalized anxiety disorder, hypertension, COPD, diabetes and chronic pain.  Review collateral information from recent hospitalization including blood work results and current medication.  Discussed to lower down the risperidone  as patient appears to be very sedated and groggy.  Currently he is taking risperidone  3 mg twice a day.  Will lowered to 2 mg twice a  day and also lower Cogentin  from 2 mg twice a day to 1 mg twice a day.  Will keep the mirtazapine  at present dose.  I encouraged to call back if symptoms started to come.  His sister is very involved in his treatment plan and very well aware about his symptoms and treatment plan.  Will follow-up in 3 months in the office for in person visit.  Last blood work shows sodium potassium BUN/creatinine is normal.  Glucose mildly elevated to 113.  GFR 96, WBC count 12.3.   Follow Up Instructions:     I discussed the assessment and treatment plan with the patient. The patient was provided an opportunity to ask questions and all were answered. The patient agreed with the plan and demonstrated an understanding of the instructions.   The patient was advised to call back or seek an in-person evaluation if the symptoms worsen or if the condition fails to improve as anticipated.    Collaboration of Care: Other provider involved in patient's care AEB notes are available in epic to review  Patient/Guardian was advised Release of Information must be obtained prior to any record release in order to collaborate their care with an outside provider. Patient/Guardian was advised if they have not already done so to contact the registration department to sign all necessary forms in order for us  to release information regarding their care.   Consent: Patient/Guardian gives verbal consent for treatment and assignment of benefits for services provided during this visit. Patient/Guardian expressed understanding and agreed to proceed.     Total encounter time 29 minutes which includes face-to-face time, chart reviewed, care coordination, order entry and documentation during this encounter.   Note: This document was prepared by [employer] voice dictation technology and any errors that results from this process are unintentional.    Leni ONEIDA Client, MD 02/03/2024

## 2024-02-04 ENCOUNTER — Other Ambulatory Visit: Payer: Self-pay

## 2024-02-04 ENCOUNTER — Ambulatory Visit (HOSPITAL_COMMUNITY)
Admission: RE | Admit: 2024-02-04 | Discharge: 2024-02-04 | Disposition: A | Discharge status: Home / Self Care | Source: Ambulatory Visit | Attending: Adult Health | Admitting: Adult Health

## 2024-02-04 DIAGNOSIS — J449 Chronic obstructive pulmonary disease, unspecified: Secondary | ICD-10-CM | POA: Diagnosis not present

## 2024-02-04 DIAGNOSIS — I10 Essential (primary) hypertension: Secondary | ICD-10-CM | POA: Insufficient documentation

## 2024-02-04 DIAGNOSIS — R918 Other nonspecific abnormal finding of lung field: Secondary | ICD-10-CM | POA: Insufficient documentation

## 2024-02-04 DIAGNOSIS — E785 Hyperlipidemia, unspecified: Secondary | ICD-10-CM | POA: Insufficient documentation

## 2024-02-04 DIAGNOSIS — C7800 Secondary malignant neoplasm of unspecified lung: Secondary | ICD-10-CM | POA: Diagnosis not present

## 2024-02-04 DIAGNOSIS — Z7901 Long term (current) use of anticoagulants: Secondary | ICD-10-CM | POA: Insufficient documentation

## 2024-02-04 DIAGNOSIS — R160 Hepatomegaly, not elsewhere classified: Secondary | ICD-10-CM | POA: Insufficient documentation

## 2024-02-04 DIAGNOSIS — C801 Malignant (primary) neoplasm, unspecified: Secondary | ICD-10-CM | POA: Diagnosis not present

## 2024-02-04 DIAGNOSIS — C787 Secondary malignant neoplasm of liver and intrahepatic bile duct: Secondary | ICD-10-CM | POA: Insufficient documentation

## 2024-02-04 DIAGNOSIS — Z01818 Encounter for other preprocedural examination: Secondary | ICD-10-CM

## 2024-02-04 LAB — CBC
HCT: 38.6 % — ABNORMAL LOW (ref 39.0–52.0)
Hemoglobin: 12.4 g/dL — ABNORMAL LOW (ref 13.0–17.0)
MCH: 26.4 pg (ref 26.0–34.0)
MCHC: 32.1 g/dL (ref 30.0–36.0)
MCV: 82.1 fL (ref 80.0–100.0)
Platelets: 300 K/uL (ref 150–400)
RBC: 4.7 MIL/uL (ref 4.22–5.81)
RDW: 15.9 % — ABNORMAL HIGH (ref 11.5–15.5)
WBC: 9.9 K/uL (ref 4.0–10.5)
nRBC: 0 % (ref 0.0–0.2)

## 2024-02-04 LAB — PROTIME-INR
INR: 1 (ref 0.8–1.2)
Prothrombin Time: 13.7 s (ref 11.4–15.2)

## 2024-02-04 MED ORDER — SODIUM CHLORIDE 0.9 % IV SOLN: INTRAVENOUS | Status: DC

## 2024-02-04 MED ORDER — MIDAZOLAM HCL (PF) 2 MG/2ML IJ SOLN
INTRAMUSCULAR | Status: AC | PRN
  Administered 2024-02-04: .5 mg via INTRAVENOUS

## 2024-02-04 MED ORDER — FENTANYL CITRATE (PF) 100 MCG/2ML IJ SOLN
INTRAMUSCULAR | Status: AC
  Filled 2024-02-04: qty 2

## 2024-02-04 MED ORDER — LIDOCAINE HCL (PF) 1 % IJ SOLN
10.0000 mL | Freq: Once | INTRAMUSCULAR | Status: AC
  Administered 2024-02-04: 10 mL

## 2024-02-04 MED ORDER — FENTANYL CITRATE (PF) 100 MCG/2ML IJ SOLN
INTRAMUSCULAR | Status: AC | PRN
Start: 2024-02-04 — End: 2024-02-04
  Administered 2024-02-04: 25 ug via INTRAVENOUS

## 2024-02-04 MED ORDER — MIDAZOLAM HCL 2 MG/2ML IJ SOLN
INTRAMUSCULAR | Status: AC
Start: 2024-02-04 — End: 2024-02-04
  Filled 2024-02-04: qty 2

## 2024-02-04 NOTE — Progress Notes (Signed)
 Went in to assess the patient before discharge. Right side of abdomen distended.Abdomen soft  Band-Aid clean, dry, and intact. Patient denies pain. Biopsy site tender. No bruising or hematoma noted. Patient is alert and oriented .Called to Radiology room and IR with no answer. Paged Dr. Luverne [phone]. He returned the call. Notified him of the above and vital signs. Per Dr. Luverne, patient is ok to discharge home. If the patient has abdominal pain, if his abdomen gets hard or bruises, he needs to go to the ER. Dr. Luverne will call and have someone check on the patient in the morning, Notified the patient and his sister of the above. They expressed understanding.

## 2024-02-04 NOTE — Procedures (Signed)
 Interventional Radiology Procedure Note  Procedure: US Guided Biopsy of liver lesion  Complications: None  Estimated Blood Loss: < 10 mL  Findings: 18 G core biopsy of right lobe liver lesion performed under US guidance.  Three core samples obtained and sent to Pathology.  Jodi Marble. Fredia Sorrow, M.D Pager:  [phone]

## 2024-02-05 ENCOUNTER — Telehealth: Payer: Self-pay | Admitting: Interventional Radiology

## 2024-02-05 NOTE — Telephone Encounter (Signed)
  IR TELEPHONE BRIEF NOTE:  I contacted patient to follow-up s/p liver lesion biopsy by Dr. Luverne yesterday, 02/04/24.   Patient was noted with minor swelling at site of liver lesion biopsy yesterday prior to discharge. Dr. Luverne was advised, and ultimately felt patient was appropriate for discharge. Patient was advised of appropriate present to ED precautions. Patient's sister (caregiver) received pt recovery instructions and present to ED precaution instructions.  Since discharge, patient's sister states he has not experienced any significant discomfort, nor asked nor needed OTC pain medications. She denies any swelling residual. Patient has been sleeping more than usual since discharge, and his sister notes a decreased appetite as well. I advised patient's sister to continue monitoring patient's PO intake over the weekend, and his alertness level. Patient may still be experiencing some of the effects of sedation today, and has decreased activity levels s/p liver biopsy. This should resolve by tomorrow.  Return to ED precautions were repeated for gradual or sudden onset of moderate to severe RUQ pain, worsening lethargy/ decreased alertness, confusion, or development of constitutional symptoms consistent with infection or blood loss. Patient's sister acknowledged these. Patient's sister confirmed she had contact information on discharge paperwork provided yesterday, and knows to reach out next week with with any questions or minor concerns.   Electronically Signed: Carlin DELENA Griffon, PA-C 02/05/2024, 3:00 PM

## 2024-02-06 ENCOUNTER — Emergency Department (HOSPITAL_COMMUNITY)

## 2024-02-06 ENCOUNTER — Observation Stay (HOSPITAL_COMMUNITY)
Admission: EM | Admit: 2024-02-06 | Discharge: 2024-02-07 | Disposition: A | Discharge status: Home / Self Care | Attending: Emergency Medicine | Admitting: Emergency Medicine

## 2024-02-06 ENCOUNTER — Encounter (HOSPITAL_COMMUNITY): Payer: Self-pay

## 2024-02-06 ENCOUNTER — Other Ambulatory Visit: Payer: Self-pay

## 2024-02-06 DIAGNOSIS — Z7901 Long term (current) use of anticoagulants: Secondary | ICD-10-CM | POA: Insufficient documentation

## 2024-02-06 DIAGNOSIS — C3490 Malignant neoplasm of unspecified part of unspecified bronchus or lung: Secondary | ICD-10-CM

## 2024-02-06 DIAGNOSIS — Z85118 Personal history of other malignant neoplasm of bronchus and lung: Secondary | ICD-10-CM | POA: Diagnosis not present

## 2024-02-06 DIAGNOSIS — Z87891 Personal history of nicotine dependence: Secondary | ICD-10-CM | POA: Insufficient documentation

## 2024-02-06 DIAGNOSIS — M4854XA Collapsed vertebra, not elsewhere classified, thoracic region, initial encounter for fracture: Secondary | ICD-10-CM | POA: Insufficient documentation

## 2024-02-06 DIAGNOSIS — E1165 Type 2 diabetes mellitus with hyperglycemia: Secondary | ICD-10-CM | POA: Diagnosis not present

## 2024-02-06 DIAGNOSIS — R0602 Shortness of breath: Secondary | ICD-10-CM | POA: Diagnosis present

## 2024-02-06 DIAGNOSIS — E785 Hyperlipidemia, unspecified: Secondary | ICD-10-CM | POA: Insufficient documentation

## 2024-02-06 DIAGNOSIS — Z79899 Other long term (current) drug therapy: Secondary | ICD-10-CM | POA: Diagnosis not present

## 2024-02-06 DIAGNOSIS — J441 Chronic obstructive pulmonary disease with (acute) exacerbation: Secondary | ICD-10-CM | POA: Diagnosis not present

## 2024-02-06 DIAGNOSIS — F209 Schizophrenia, unspecified: Secondary | ICD-10-CM | POA: Diagnosis not present

## 2024-02-06 DIAGNOSIS — I2782 Chronic pulmonary embolism: Secondary | ICD-10-CM | POA: Diagnosis not present

## 2024-02-06 DIAGNOSIS — N400 Enlarged prostate without lower urinary tract symptoms: Secondary | ICD-10-CM | POA: Insufficient documentation

## 2024-02-06 DIAGNOSIS — E872 Acidosis, unspecified: Secondary | ICD-10-CM | POA: Insufficient documentation

## 2024-02-06 DIAGNOSIS — I10 Essential (primary) hypertension: Secondary | ICD-10-CM | POA: Diagnosis not present

## 2024-02-06 DIAGNOSIS — R0689 Other abnormalities of breathing: Secondary | ICD-10-CM | POA: Diagnosis not present

## 2024-02-06 DIAGNOSIS — R0902 Hypoxemia: Secondary | ICD-10-CM | POA: Diagnosis not present

## 2024-02-06 DIAGNOSIS — E119 Type 2 diabetes mellitus without complications: Secondary | ICD-10-CM

## 2024-02-06 DIAGNOSIS — Z1152 Encounter for screening for COVID-19: Secondary | ICD-10-CM | POA: Insufficient documentation

## 2024-02-06 DIAGNOSIS — R000 Tachycardia, unspecified: Secondary | ICD-10-CM | POA: Diagnosis not present

## 2024-02-06 LAB — COMPREHENSIVE METABOLIC PANEL WITH GFR
ALT: 13 U/L (ref 0–44)
AST: 16 U/L (ref 15–41)
Albumin: 2.8 g/dL — ABNORMAL LOW (ref 3.5–5.0)
Alkaline Phosphatase: 70 U/L (ref 38–126)
Anion gap: 18 — ABNORMAL HIGH (ref 5–15)
BUN: 9 mg/dL (ref 8–23)
CO2: 17 mmol/L — ABNORMAL LOW (ref 22–32)
Calcium: 8.5 mg/dL — ABNORMAL LOW (ref 8.9–10.3)
Chloride: 100 mmol/L (ref 98–111)
Creatinine, Ser: 0.75 mg/dL (ref 0.61–1.24)
GFR, Estimated: >60 mL/min (ref >60)
Glucose, Bld: 97 mg/dL (ref 70–99)
Potassium: 4.4 mmol/L (ref 3.5–5.1)
Sodium: 135 mmol/L (ref 135–145)
Total Bilirubin: 1 mg/dL (ref 0.0–1.2)
Total Protein: 6.7 g/dL (ref 6.5–8.1)

## 2024-02-06 LAB — CBC WITH DIFFERENTIAL/PLATELET
Abs Immature Granulocytes: 0.06 K/uL (ref 0.00–0.07)
Basophils Absolute: 0.1 K/uL (ref 0.0–0.1)
Basophils Relative: 1 %
Eosinophils Absolute: 0.6 K/uL — ABNORMAL HIGH (ref 0.0–0.5)
Eosinophils Relative: 4 %
HCT: 40.8 % (ref 39.0–52.0)
Hemoglobin: 12.9 g/dL — ABNORMAL LOW (ref 13.0–17.0)
Immature Granulocytes: 0 %
Lymphocytes Relative: 19 %
Lymphs Abs: 2.9 K/uL (ref 0.7–4.0)
MCH: 26.6 pg (ref 26.0–34.0)
MCHC: 31.6 g/dL (ref 30.0–36.0)
MCV: 84.1 fL (ref 80.0–100.0)
Monocytes Absolute: 1.8 K/uL — ABNORMAL HIGH (ref 0.1–1.0)
Monocytes Relative: 11 %
Neutro Abs: 9.9 K/uL — ABNORMAL HIGH (ref 1.7–7.7)
Neutrophils Relative %: 65 %
Platelets: 291 K/uL (ref 150–400)
RBC: 4.85 MIL/uL (ref 4.22–5.81)
RDW: 15.5 % (ref 11.5–15.5)
WBC: 15.3 K/uL — ABNORMAL HIGH (ref 4.0–10.5)
nRBC: 0 % (ref 0.0–0.2)

## 2024-02-06 LAB — I-STAT VENOUS BLOOD GAS, ED
Acid-Base Excess: 0 mmol/L (ref 0.0–2.0)
Bicarbonate: 26 mmol/L (ref 20.0–28.0)
Calcium, Ion: 1.18 mmol/L (ref 1.15–1.40)
HCT: 41 % (ref 39.0–52.0)
Hemoglobin: 13.9 g/dL (ref 13.0–17.0)
O2 Saturation: 87 %
Potassium: 4.6 mmol/L (ref 3.5–5.1)
Sodium: 135 mmol/L (ref 135–145)
TCO2: 27 mmol/L (ref 22–32)
pCO2, Ven: 47.2 mmHg (ref 44–60)
pH, Ven: 7.348 (ref 7.25–7.43)
pO2, Ven: 56 mmHg — ABNORMAL HIGH (ref 32–45)

## 2024-02-06 LAB — RESP PANEL BY RT-PCR (RSV, FLU A&B, COVID)  RVPGX2
Influenza A by PCR: NEGATIVE
Influenza B by PCR: NEGATIVE
Resp Syncytial Virus by PCR: NEGATIVE
SARS Coronavirus 2 by RT PCR: NEGATIVE

## 2024-02-06 LAB — I-STAT CG4 LACTIC ACID, ED
Lactic Acid, Venous: 0.9 mmol/L (ref 0.5–1.9)
Lactic Acid, Venous: 1.1 mmol/L (ref 0.5–1.9)

## 2024-02-06 LAB — TROPONIN I (HIGH SENSITIVITY)
Troponin I (High Sensitivity): 5 ng/L (ref <18)
Troponin I (High Sensitivity): 7 ng/L (ref <18)

## 2024-02-06 LAB — BRAIN NATRIURETIC PEPTIDE: B Natriuretic Peptide: 54.9 pg/mL (ref 0.0–100.0)

## 2024-02-06 MED ORDER — IOHEXOL 350 MG/ML SOLN
75.0000 mL | Freq: Once | INTRAVENOUS | Status: AC | PRN
  Administered 2024-02-06: 75 mL via INTRAVENOUS

## 2024-02-06 MED ORDER — IPRATROPIUM-ALBUTEROL 0.5-2.5 (3) MG/3ML IN SOLN
3.0000 mL | RESPIRATORY_TRACT | Status: AC
  Administered 2024-02-06 (×3): 3 mL via RESPIRATORY_TRACT
  Filled 2024-02-06: qty 9

## 2024-02-06 NOTE — ED Provider Notes (Incomplete)
 [facility]ERG[facility] DEPARTMENT AT [facility] Provider Note   CSN: [id number] Arrival date & time: 02/06/24  2001     Patient presents with: Shortness of Breath   Blake Mcknight is a 68 y.o. male.  {Add pertinent medical, surgical, social history, OB history to HPI:32947} HPI      68yo male with history of COPD on 2L, hypertension, hyperlipidemia, presumed lung cancer with suspicious RLL mass s/p XRT, pulmonary embolism diagnosed in July 2025 (eliquis  held for biopsy), liver biopsy done 2 days ago who presents with concern for shortness of breath.  Shortness of breath started today , might have had some last night but worse today, breathing treatments at home without help Cough No fevers, no chest pain   Past Medical History:  Diagnosis Date   Abdominal pain 06/03/2013   Chest pain 08/10/2023   COPD (chronic obstructive pulmonary disease) (HCC)    with bullous emphysema.    COPD exacerbation (HCC) 08/09/2023   Heavy cigarette smoker before 2003   pt claims only 10 cigs per day, never heavier amounts.    HLD (hyperlipidemia)    Hypertension    Schizophrenia (HCC) 06/28/2013   This is a chronic condition and he lives with family    Past Surgical History:  Procedure Laterality Date   CHOLECYSTECTOMY N/A 06/06/2013   Procedure: LAPAROSCOPIC CHOLECYSTECTOMY WITH ATTEMPTED INTRAOPERATIVE CHOLANGIOGRAM;  Surgeon: Dann FORBES Hummer, MD;  Location: [facility] OR;  Service: General;  Laterality: N/A;   ERCP N/A 06/03/2013   Procedure: ENDOSCOPIC RETROGRADE CHOLANGIOPANCREATOGRAPHY (ERCP);  Surgeon: Gwendlyn ONEIDA Buddy, MD;  Location: [facility] ENDOSCOPY;  Service: Endoscopy;  Laterality: N/A;    Prior to Admission medications   Medication Sig Start Date End Date Taking? Authorizing Provider  acetaminophen  (TYLENOL ) 500 MG tablet Take 2 tablets (1,000 mg total) by mouth every 8 (eight) hours. Patient taking differently: Take 1,000 mg by mouth every 8 (eight) hours as needed for  mild pain (pain score 1-3) or headache. 08/14/23   Fausto Burnard LABOR, DO  albuterol  (VENTOLIN  HFA) 108 (90 Base) MCG/ACT inhaler Inhale 2 puffs into the lungs every 4 (four) hours as needed for wheezing or shortness of breath. 10/02/22   Bernard Drivers, MD  apixaban  (ELIQUIS ) 5 MG TABS tablet Take 1 tablet (5 mg total) by mouth 2 (two) times daily. 10/22/23   Shelah Lamar RAMAN, MD  arformoterol  (BROVANA ) 15 MCG/2ML NEBU Take 2 mLs (15 mcg total) by nebulization 2 (two) times daily. 09/10/23   Will Almarie MATSU, MD  atorvastatin  (LIPITOR) 10 MG tablet Take 1 tablet (10 mg total) by mouth at bedtime. 08/14/23   Fausto Burnard A, DO  benztropine  (COGENTIN ) 1 MG tablet Take 1 tablet (1 mg total) by mouth 2 (two) times daily. 02/03/24   Arfeen, Leni ONEIDA, MD  carvedilol  (COREG ) 3.125 MG tablet Take 2 tablets (6.25 mg total) by mouth 2 (two) times daily with a meal. 09/10/23   Will Almarie MATSU, MD  cetirizine (ZYRTEC) 10 MG tablet Take 10 mg by mouth daily as needed for allergies or rhinitis.    [provider]  cholecalciferol  (VITAMIN D ) 25 MCG (1000 UNIT) tablet Take 5,000 Units by mouth every morning.    [provider]  cyanocobalamin  1000 MCG tablet Take 1,000 mcg by mouth daily.    [provider]  famotidine  (PEPCID ) 40 MG tablet Take 40 mg by mouth 2 (two) times daily. 12/28/18   [provider]  feeding supplement, GLUCERNA SHAKE, (GLUCERNA SHAKE) LIQD  Take 237 mLs by mouth 3 (three) times daily between meals. 08/14/23   Fausto Burnard LABOR, DO  Fluticasone -Umeclidin-Vilant (TRELEGY ELLIPTA ) 100-62.5-25 MCG/ACT AEPB Inhale 1 puff into the lungs daily. 01/01/24   Parrett, Madelin RAMAN, NP  hydrOXYzine  (ATARAX ) 25 MG tablet Take 1 tablet (25 mg total) by mouth 3 (three) times daily as needed for anxiety. 10/12/23   Sherrill Cable Latif, DO  ipratropium-albuterol  (DUONEB) 0.5-2.5 (3) MG/3ML SOLN Take 3 mLs by nebulization every 4 (four) hours. Patient taking differently: Take 3  mLs by nebulization every 4 (four) hours as needed. 08/14/23   Fausto Burnard A, DO  JANUVIA 100 MG tablet Take 100 mg by mouth every evening. 01/30/23   [provider]  lidocaine  (LIDODERM ) 5 % Place 1 patch onto the skin daily. Remove & Discard patch within 12 hours or as directed by MD 09/10/23   Will Almarie MATSU, MD  methocarbamol  (ROBAXIN ) 750 MG tablet Take 1 tablet (750 mg total) by mouth every 8 (eight) hours as needed for muscle spasms. 09/10/23   Will Almarie MATSU, MD  mirtazapine  (REMERON ) 30 MG tablet Take 1 tablet (30 mg total) by mouth at bedtime as needed (insomnia). 02/03/24 02/02/25  Arfeen, Leni DASEN, MD  multivitamin (ONE-A-DAY MEN'S) TABS tablet Take 1 tablet by mouth daily.    [provider]  OXYGEN  Inhale 2 L/min into the lungs continuous.     [provider]  polyethylene glycol (MIRALAX  / GLYCOLAX ) 17 g packet Take 17 g by mouth daily. 09/10/23   Will Almarie MATSU, MD  predniSONE  (DELTASONE ) 20 MG tablet Take 40 mg by mouth daily for 3 days, then 30 mg by mouth daily for 3 days, then go back on your usual maintenance regimen of prednisone  20 mg by mouth daily. 11/29/23   Ghimire, Donalda HERO, MD  risperiDONE  (RISPERDAL ) 2 MG tablet Take 1 tablet (2 mg total) by mouth 2 (two) times daily. 02/03/24   Arfeen, Leni DASEN, MD  sodium chloride  (OCEAN) 0.65 % SOLN nasal spray Place 1 spray into both nostrils as needed for congestion.    [provider]  tamsulosin  (FLOMAX ) 0.4 MG CAPS capsule Take 0.4 mg by mouth in the morning and at bedtime. 07/23/20   [provider]  traMADol  (ULTRAM ) 50 MG tablet Take 1 tablet (50 mg total) by mouth every 6 (six) hours as needed. 10/21/23   Shelah Lamar RAMAN, MD  TRELEGY ELLIPTA  100-62.5-25 MCG/ACT AEPB Inhale 1 puff into the lungs daily. 08/29/23   [provider]    Allergies: Patient has no known allergies.    Review of Systems  Updated Vital Signs BP 94/61   Pulse (!) 111   Temp 98.7 F  (37.1 C) (Oral)   Resp 18   SpO2 100%   Physical Exam  (all labs ordered are listed, but only abnormal results are displayed) Labs Reviewed - No data to display  EKG: None  Radiology: No results found.  {Document cardiac monitor, telemetry assessment procedure when appropriate:32947} Procedures   Medications Ordered in the ED - No data to display    {Click here for ABCD2, HEART and other calculators REFRESH Note before signing:1}                              Medical Decision Making Amount and/or Complexity of Data Reviewed Labs: ordered. Radiology: ordered.  Risk Prescription drug management.   ***  {Document critical care time when  appropriate  Document review of labs and clinical decision tools ie CHADS2VASC2, etc  Document your independent review of radiology images and any outside records  Document your discussion with family members, caretakers and with consultants  Document social determinants of health affecting pt's care  Document your decision making why or why not admission, treatments were needed:32947:::1}   Final diagnoses:  None    ED Discharge Orders     None

## 2024-02-06 NOTE — ED Notes (Signed)
 Patient transported to CT

## 2024-02-06 NOTE — ED Triage Notes (Signed)
 SOB x 4-6 hours. Two breathing treatments at home with no improvement. Hx of COPD on 2L nasal cannula at home. Tachypneic, diminished lung sounds. Given solumedrol en route.

## 2024-02-07 DIAGNOSIS — E872 Acidosis, unspecified: Secondary | ICD-10-CM

## 2024-02-07 DIAGNOSIS — F203 Undifferentiated schizophrenia: Secondary | ICD-10-CM

## 2024-02-07 DIAGNOSIS — J441 Chronic obstructive pulmonary disease with (acute) exacerbation: Principal | ICD-10-CM | POA: Diagnosis present

## 2024-02-07 DIAGNOSIS — C3490 Malignant neoplasm of unspecified part of unspecified bronchus or lung: Secondary | ICD-10-CM

## 2024-02-07 LAB — CBG MONITORING, ED
Glucose-Capillary: 228 mg/dL — ABNORMAL HIGH (ref 70–99)
Glucose-Capillary: 176 mg/dL — ABNORMAL HIGH (ref 70–99)

## 2024-02-07 LAB — BASIC METABOLIC PANEL WITH GFR
Anion gap: 12 (ref 5–15)
BUN: 10 mg/dL (ref 8–23)
CO2: 21 mmol/L — ABNORMAL LOW (ref 22–32)
Calcium: 8.5 mg/dL — ABNORMAL LOW (ref 8.9–10.3)
Chloride: 99 mmol/L (ref 98–111)
Creatinine, Ser: 0.66 mg/dL (ref 0.61–1.24)
GFR, Estimated: >60 mL/min (ref >60)
Glucose, Bld: 172 mg/dL — ABNORMAL HIGH (ref 70–99)
Potassium: 4.2 mmol/L (ref 3.5–5.1)
Sodium: 132 mmol/L — ABNORMAL LOW (ref 135–145)

## 2024-02-07 LAB — CBC
HCT: 38.2 % — ABNORMAL LOW (ref 39.0–52.0)
Hemoglobin: 12.5 g/dL — ABNORMAL LOW (ref 13.0–17.0)
MCH: 26.5 pg (ref 26.0–34.0)
MCHC: 32.7 g/dL (ref 30.0–36.0)
MCV: 80.9 fL (ref 80.0–100.0)
Platelets: 283 K/uL (ref 150–400)
RBC: 4.72 MIL/uL (ref 4.22–5.81)
RDW: 15.1 % (ref 11.5–15.5)
WBC: 8.2 K/uL (ref 4.0–10.5)
nRBC: 0 % (ref 0.0–0.2)

## 2024-02-07 LAB — PROCALCITONIN: Procalcitonin: 0.7 ng/mL

## 2024-02-07 MED ORDER — GUAIFENESIN ER 600 MG PO TB12
600.0000 mg | ORAL_TABLET | Freq: Two times a day (BID) | ORAL | Status: DC
  Administered 2024-02-07: 600 mg via ORAL
  Filled 2024-02-07: qty 1

## 2024-02-07 MED ORDER — ACETAMINOPHEN 325 MG PO TABS
650.0000 mg | ORAL_TABLET | Freq: Four times a day (QID) | ORAL | Status: DC | PRN
Start: 2024-02-07 — End: 2024-02-07

## 2024-02-07 MED ORDER — LEVOFLOXACIN 750 MG PO TABS: 750.0000 mg | ORAL_TABLET | Freq: Every day | ORAL | 0 refills | Status: AC

## 2024-02-07 MED ORDER — ALBUTEROL SULFATE (2.5 MG/3ML) 0.083% IN NEBU
2.5000 mg | INHALATION_SOLUTION | RESPIRATORY_TRACT | Status: DC | PRN
  Administered 2024-02-07: 2.5 mg via RESPIRATORY_TRACT
  Filled 2024-02-07: qty 3

## 2024-02-07 MED ORDER — APIXABAN 5 MG PO TABS
5.0000 mg | ORAL_TABLET | Freq: Two times a day (BID) | ORAL | Status: DC
  Administered 2024-02-07: 5 mg via ORAL
  Filled 2024-02-07: qty 1

## 2024-02-07 MED ORDER — BUDESONIDE 0.25 MG/2ML IN SUSP
0.2500 mg | Freq: Two times a day (BID) | RESPIRATORY_TRACT | Status: DC
  Administered 2024-02-07: 0.25 mg via RESPIRATORY_TRACT
  Filled 2024-02-07: qty 2

## 2024-02-07 MED ORDER — SODIUM CHLORIDE 0.9 % IV SOLN: INTRAVENOUS | Status: DC

## 2024-02-07 MED ORDER — PREDNISONE 20 MG PO TABS: 40.0000 mg | ORAL_TABLET | Freq: Every day | ORAL | 0 refills | Status: AC

## 2024-02-07 MED ORDER — IPRATROPIUM-ALBUTEROL 0.5-2.5 (3) MG/3ML IN SOLN
3.0000 mL | Freq: Four times a day (QID) | RESPIRATORY_TRACT | Status: DC
  Administered 2024-02-07: 3 mL via RESPIRATORY_TRACT
  Filled 2024-02-07: qty 3

## 2024-02-07 MED ORDER — INSULIN ASPART 100 UNIT/ML IJ SOLN: 0.0000 [IU] | Freq: Every day | INTRAMUSCULAR | Status: DC

## 2024-02-07 MED ORDER — PREDNISONE 20 MG PO TABS
40.0000 mg | ORAL_TABLET | Freq: Every day | ORAL | Status: DC
  Administered 2024-02-07: 40 mg via ORAL
  Filled 2024-02-07: qty 2

## 2024-02-07 MED ORDER — GUAIFENESIN ER 600 MG PO TB12: 600.0000 mg | ORAL_TABLET | Freq: Two times a day (BID) | ORAL | 0 refills | Status: AC | PRN

## 2024-02-07 MED ORDER — ACETAMINOPHEN 650 MG RE SUPP: 650.0000 mg | Freq: Four times a day (QID) | RECTAL | Status: DC | PRN

## 2024-02-07 MED ORDER — INSULIN ASPART 100 UNIT/ML IJ SOLN
0.0000 [IU] | Freq: Three times a day (TID) | INTRAMUSCULAR | Status: DC
  Administered 2024-02-07: 2 [IU] via SUBCUTANEOUS
  Administered 2024-02-07: 3 [IU] via SUBCUTANEOUS

## 2024-02-07 NOTE — Hospital Course (Addendum)
 Partly taken from H&P.  Blake Mcknight is a 68 y.o. male with medical history significant of COPD on 2 L home oxygen , former smoker, hypertension, hyperlipidemia, type 2 diabetes, schizophrenia, lung cancer status post XRT, history of PE on Eliquis , BPH presented to the ED with shortness of breath and respiratory distress.  Patient took 2 breathing treatments at home with no improvement and was given Solu-Medrol  by EMS.    Labs notable for WBC count 15.3, hemoglobin 12.9 (stable), bicarb 17, anion gap 18, glucose 97, creatinine 0.75, troponin negative x 2, BNP normal, COVID/influenza/RSV PCR negative, VBG with pH 7.35 and pCO2 47, lactic acid normal x 2.    CTA with no acute PE, stable chronic thrombus within the right lower lobe pulmonary artery.  Stable masslike consolidation right lower lobe. Progressive subcarinal lymphadenopathy consistent with metastatic disease. Chronic T7 compression deformity with a new development of T6 and T8 anterior wedge compression deformities.  Partial visualization of metastatic liver lesion.  11/23: Vital stable, leukocytosis improved, procalcitonin at 0.7, patient with increased sputum production and no wheezing.  Wants to go home. Had a recent liver biopsy for concern of progression of his lung cancer with pending results.  Procalcitonin can be due to his advancing malignancy but patient is high risk so given a 5-day course of Levaquin  along with a 5-day course of prednisone .  He was instructed to take prednisone  40 mg daily for next 5 days and then resume his prior dose of 20 mg daily.  He was also given some Mucinex .  Discussed with sister who is his caregiver and they have follow-up with his pulmonologist early next week.  Patient will continue on current medications and need to have a close follow-up with his providers for further management.

## 2024-02-07 NOTE — ED Notes (Signed)
 CCMD called by this RN

## 2024-02-07 NOTE — ED Provider Notes (Signed)
  Provider Note MRN:  [id number]  Arrival date & time: 02/07/24    ED Course and Medical Decision Making  Assumed care of patient at sign-out or upon transfer.  Admitted to hospitalist service for COPD exacerbation.  Patient clinically improved.  Procedures  Final Clinical Impressions(s) / ED Diagnoses     ICD-10-CM   1. COPD exacerbation [facility])  J44.1       ED Discharge Orders     None       Discharge Instructions   None     Ozell HERO. Theadore, MD [facility] Emergency Medicine [facility] [email or web address]    Theadore Ozell HERO, MD 02/07/24 [phone]

## 2024-02-07 NOTE — Discharge Summary (Signed)
 Physician Discharge Summary   Patient: Blake Mcknight MRN: [id number] DOB: 10/02/1955  Admit date:     02/06/2024  Discharge date: 02/07/24  Discharge Physician: Amaryllis Dare   PCP: [name], MD   Recommendations at discharge:  Follow-up with primary care provider Follow-up with his pulmonologist  Discharge Diagnoses: Principal Problem:   COPD exacerbation (HCC) Active Problems:   Type 2 diabetes mellitus (HCC)   Metabolic acidosis   Schizophrenia (HCC)   Lung cancer [facility])   Hospital Course: Partly taken from H&P.  Blake Mcknight is a 68 y.o. male with medical history significant of COPD on 2 L home oxygen , former smoker, hypertension, hyperlipidemia, type 2 diabetes, schizophrenia, lung cancer status post XRT, history of PE on Eliquis , BPH presented to the ED with shortness of breath and respiratory distress.  Patient took 2 breathing treatments at home with no improvement and was given Solu-Medrol  by EMS.    Labs notable for WBC count 15.3, hemoglobin 12.9 (stable), bicarb 17, anion gap 18, glucose 97, creatinine 0.75, troponin negative x 2, BNP normal, COVID/influenza/RSV PCR negative, VBG with pH 7.35 and pCO2 47, lactic acid normal x 2.    CTA with no acute PE, stable chronic thrombus within the right lower lobe pulmonary artery.  Stable masslike consolidation right lower lobe. Progressive subcarinal lymphadenopathy consistent with metastatic disease. Chronic T7 compression deformity with a new development of T6 and T8 anterior wedge compression deformities.  Partial visualization of metastatic liver lesion.  11/23: Vital stable, leukocytosis improved, procalcitonin at 0.7, patient with increased sputum production and no wheezing.  Wants to go home. Had a recent liver biopsy for concern of progression of his lung cancer with pending results.  Procalcitonin can be due to his advancing malignancy but patient is high risk so given a 5-day course of Levaquin   along with a 5-day course of prednisone .  He was instructed to take prednisone  40 mg daily for next 5 days and then resume his prior dose of 20 mg daily.  He was also given some Mucinex .  Discussed with sister who is his caregiver and they have follow-up with his pulmonologist early next week.  Patient will continue on current medications and need to have a close follow-up with his providers for further management.   Consultants: None Procedures performed: None Disposition: Home Diet recommendation:  Discharge Diet Orders (From admission, onward)     Start     Ordered   02/07/24 0000  Diet - low sodium heart healthy        02/07/24 1209           Carb modified diet DISCHARGE MEDICATION: Allergies as of 02/07/2024   No Known Allergies      Medication List     TAKE these medications    acetaminophen  500 MG tablet Commonly known as: TYLENOL  Take 2 tablets (1,000 mg total) by mouth every 8 (eight) hours. What changed:  when to take this reasons to take this   albuterol  108 (90 Base) MCG/ACT inhaler Commonly known as: VENTOLIN  HFA Inhale 2 puffs into the lungs every 4 (four) hours as needed for wheezing or shortness of breath.   albuterol  (2.5 MG/3ML) 0.083% nebulizer solution Commonly known as: PROVENTIL  Take 2.5 mg by nebulization every 6 (six) hours as needed.   apixaban  5 MG Tabs tablet Commonly known as: ELIQUIS  Take 1 tablet (5 mg total) by mouth 2 (two) times daily.   atorvastatin  10 MG tablet Commonly known as: LIPITOR Take 1  tablet (10 mg total) by mouth at bedtime.   benztropine  1 MG tablet Commonly known as: COGENTIN  Take 1 tablet (1 mg total) by mouth 2 (two) times daily.   carvedilol  3.125 MG tablet Commonly known as: COREG  Take 2 tablets (6.25 mg total) by mouth 2 (two) times daily with a meal.   cetirizine 10 MG tablet Commonly known as: ZYRTEC Take 10 mg by mouth daily as needed for allergies or rhinitis.   cholecalciferol  25 MCG (1000  UNIT) tablet Commonly known as: VITAMIN D3 Take 2,000-4,000 Units by mouth every morning.   cyanocobalamin  1000 MCG tablet Take 1,000 mcg by mouth daily.   DRY EYES OP Place 1 drop into both eyes 2 (two) times daily as needed (itichy eyes).   famotidine  40 MG tablet Commonly known as: PEPCID  Take 40 mg by mouth 2 (two) times daily.   feeding supplement (GLUCERNA SHAKE) Liqd Take 237 mLs by mouth 3 (three) times daily between meals.   guaiFENesin  600 MG 12 hr tablet Commonly known as: MUCINEX  Take 1 tablet (600 mg total) by mouth 2 (two) times daily as needed.   hydrOXYzine  25 MG tablet Commonly known as: ATARAX  Take 1 tablet (25 mg total) by mouth 3 (three) times daily as needed for anxiety.   ipratropium-albuterol  0.5-2.5 (3) MG/3ML Soln Commonly known as: DUONEB Take 3 mLs by nebulization every 4 (four) hours. What changed:  when to take this reasons to take this   Januvia 100 MG tablet Generic drug: sitaGLIPtin Take 100 mg by mouth every evening.   levofloxacin  750 MG tablet Commonly known as: Levaquin  Take 1 tablet (750 mg total) by mouth daily for 5 days.   lidocaine  5 % Commonly known as: LIDODERM  Place 1 patch onto the skin daily. Remove & Discard patch within 12 hours or as directed by MD What changed:  when to take this reasons to take this   methocarbamol  750 MG tablet Commonly known as: ROBAXIN  Take 1 tablet (750 mg total) by mouth every 8 (eight) hours as needed for muscle spasms.   mirtazapine  30 MG tablet Commonly known as: REMERON  Take 1 tablet (30 mg total) by mouth at bedtime as needed (insomnia).   multivitamin Tabs tablet Take 1 tablet by mouth daily.   OXYGEN  Inhale 2 L/min into the lungs continuous.   polyethylene glycol 17 g packet Commonly known as: MIRALAX  / GLYCOLAX  Take 17 g by mouth daily.   predniSONE  20 MG tablet Commonly known as: DELTASONE  Take 40 mg by mouth daily for 3 days, then 30 mg by mouth daily for 3 days, then  go back on your usual maintenance regimen of prednisone  20 mg by mouth daily. What changed:  how much to take how to take this when to take this additional instructions   predniSONE  20 MG tablet Commonly known as: DELTASONE  Take 2 tablets (40 mg total) by mouth daily with breakfast for 5 days. Then resume home dose of 20 mg Start taking on: February 08, 2024 What changed: You were already taking a medication with the same name, and this prescription was added. Make sure you understand how and when to take each.   risperiDONE  2 MG tablet Commonly known as: RISPERDAL  Take 1 tablet (2 mg total) by mouth 2 (two) times daily.   sodium chloride  0.65 % Soln nasal spray Commonly known as: OCEAN Place 1 spray into both nostrils as needed for congestion.   tamsulosin  0.4 MG Caps capsule Commonly known as: FLOMAX  Take 0.4 mg  by mouth in the morning and at bedtime.   traMADol  50 MG tablet Commonly known as: ULTRAM  Take 1 tablet (50 mg total) by mouth every 6 (six) hours as needed.   Trelegy Ellipta  100-62.5-25 MCG/ACT Aepb Generic drug: Fluticasone -Umeclidin-Vilant Inhale 1 puff into the lungs daily.        Follow-up Information     [name], MD. Schedule an appointment as soon as possible for a visit in 1 week(s).   Specialty: Pulmonary Disease Contact information: [address] [place] [address] [phone]                Discharge Exam: There were no vitals filed for this visit. General.  Frail and malnourished gentleman, in no acute distress. Pulmonary.  Lungs clear bilaterally, normal respiratory effort. CV.  Regular rate and rhythm, no JVD, rub or murmur. Abdomen.  Soft, nontender, nondistended, BS positive. CNS.  Alert and oriented .  No focal neurologic deficit. Extremities.  No edema, no cyanosis, pulses intact and symmetrical.  Condition at discharge: stable  The results of significant diagnostics from this hospitalization  (including imaging, microbiology, ancillary and laboratory) are listed below for reference.   Imaging Studies: CT Angio Chest PE W and/or Wo Contrast Result Date: 02/06/2024 CLINICAL DATA:  Shortness of breath, known metastatic lung cancer EXAM: CT ANGIOGRAPHY CHEST WITH CONTRAST TECHNIQUE: Multidetector CT imaging of the chest was performed using the standard protocol during bolus administration of intravenous contrast. Multiplanar CT image reconstructions and MIPs were obtained to evaluate the vascular anatomy. RADIATION DOSE REDUCTION: This exam was performed according to the departmental dose-optimization program which includes automated exposure control, adjustment of the mA and/or kV according to patient size and/or use of iterative reconstruction technique. CONTRAST:  75mL OMNIPAQUE  IOHEXOL  350 MG/ML SOLN COMPARISON:  12/08/2023, 09/30/2023 FINDINGS: Cardiovascular: This is a technically adequate evaluation of the pulmonary vasculature. Stable chronic thrombus within the right lower lobe pulmonary artery. No acute filling defects or evidence of acute pulmonary emboli. The heart is unremarkable without pericardial effusion. No evidence of thoracic aortic aneurysm or dissection. Atherosclerosis of the aorta and coronary vasculature. Mediastinum/Nodes: Progressive subcarinal lymphadenopathy measuring 4.3 x 2.9 cm reference image 92/5. Thyroid, trachea, and esophagus are stable. Lungs/Pleura: Severe emphysematous changes are again noted. Peripheral masslike consolidation within the right lower lobe again noted, measuring 3.6 x 5.3 cm. Trace right pleural effusion. No pneumothorax. Upper Abdomen: Partial visualization of the metastatic lesion within the inferior right lobe liver noted on prior PET scan, incompletely assessed due to slice selection. No acute upper abdominal findings. Musculoskeletal: There is a chronic T7 compression deformity and resulting vertebra plana. Since the prior exams, compression  deformities have developed at T6 and T8 with approximately 50% loss of height. Pathologic fractures cannot be excluded. Reconstructed images demonstrate no additional findings. Review of the MIP images confirms the above findings. IMPRESSION: 1. No evidence of acute pulmonary embolus. 2. Stable chronic thrombus within the right lower lobe pulmonary artery. 3. Persistent masslike consolidation right lower lobe compatible with known history of lung cancer. 4. Progressive subcarinal lymphadenopathy consistent with metastatic disease. 5. Chronic T7 compression deformity, with interval development of T6 and T8 anterior wedge compression deformities. Pathologic fractures cannot be excluded. 6. Partial visualization of the metastatic liver lesion, incompletely visualized due to slice selection. 7. Aortic Atherosclerosis (ICD10-I70.0) and Emphysema (ICD10-J43.9). Electronically Signed   By: Ozell Daring M.D.   On: 02/06/2024 22:52   US  BIOPSY (LIVER) Result Date: 02/04/2024 INDICATION: Right lower lobe  lung mass, nodal metastatic disease and right liver mass likely representing metastatic disease. The patient presents for liver mass biopsy. EXAM: ULTRASOUND GUIDED CORE BIOPSY OF LIVER MEDICATIONS: None. ANESTHESIA/SEDATION: Moderate (conscious) sedation was employed during this procedure. A total of Versed  0.5 mg and Fentanyl  25 mcg was administered intravenously. Moderate Sedation Time: 15 minutes. The patient's level of consciousness and vital signs were monitored continuously by radiology nursing throughout the procedure under my direct supervision. PROCEDURE: The procedure, risks, benefits, and alternatives were explained to the patient. Questions regarding the procedure were encouraged and answered. The patient understands and consents to the procedure. A time out was performed prior to initiating the procedure. The abdominal wall was prepped with chlorhexidine  in a sterile fashion, and a sterile drape was  applied covering the operative field. A sterile gown and sterile gloves were used for the procedure. Local anesthesia was provided with 1% Lidocaine . Ultrasound was used to localize a mass within the right lobe of the liver. Under ultrasound guidance, a 17 gauge trocar needle was advanced to the level of the lesion. Three separate coaxial 18 gauge core biopsy samples were obtained through the mass. Core biopsy samples were submitted in formalin. A slurry of Gel-Foam EmboCubes mixed in saline was then injected via the outer needle as the needle was retracted and removed. Additional ultrasound was performed. COMPLICATIONS: None immediate. FINDINGS: Ultrasound demonstrates a hypoechoic mass within the inferior aspect of the right lobe of the liver measuring roughly 4.7 x 2.3 x 3.6 cm. Solid tissue was obtained from the lesion. IMPRESSION: Ultrasound-guided core biopsy performed of a mass within the inferior right lobe of the liver measuring up to 4.7 cm by ultrasound. Electronically Signed   By: Marcey Moan M.D.   On: 02/04/2024 16:48    Microbiology: Results for orders placed or performed during the hospital encounter of 02/06/24  Resp panel by RT-PCR (RSV, Flu A&B, Covid) Anterior Nasal Swab     Status: None   Collection Time: 02/06/24  8:57 PM   Specimen: Anterior Nasal Swab  Result Value Ref Range Status   SARS Coronavirus 2 by RT PCR NEGATIVE NEGATIVE Final   Influenza A by PCR NEGATIVE NEGATIVE Final   Influenza B by PCR NEGATIVE NEGATIVE Final    Comment: (NOTE) The Xpert Xpress SARS-CoV-2/FLU/RSV plus assay is intended as an aid in the diagnosis of influenza from Nasopharyngeal swab specimens and should not be used as a sole basis for treatment. Nasal washings and aspirates are unacceptable for Xpert Xpress SARS-CoV-2/FLU/RSV testing.  Fact Sheet for Patients: bloggercourse.com  Fact Sheet for Healthcare  Providers: seriousbroker.it  This test is not yet approved or cleared by the [place]  FDA and has been authorized for detection and/or diagnosis of SARS-CoV-2 by FDA under an Emergency Use Authorization (EUA). This EUA will remain in effect (meaning this test can be used) for the duration of the COVID-19 declaration under Section 564(b)(1) of the Act, 21 U.S.C. section 360bbb-3(b)(1), unless the authorization is terminated or revoked.     Resp Syncytial Virus by PCR NEGATIVE NEGATIVE Final    Comment: (NOTE) Fact Sheet for Patients: bloggercourse.com  Fact Sheet for Healthcare Providers: seriousbroker.it  This test is not yet approved or cleared by the [place]  FDA and has been authorized for detection and/or diagnosis of SARS-CoV-2 by FDA under an Emergency Use Authorization (EUA). This EUA will remain in effect (meaning this test can be used) for the duration of the COVID-19 declaration under Section 564(b)(1) of the  Act, 21 U.S.C. section 360bbb-3(b)(1), unless the authorization is terminated or revoked.  Performed at [facility], [address], [place], [place] [address]     Labs: CBC: Recent Labs  Lab 02/04/24 1447 02/06/24 2015 02/06/24 2123 02/07/24 1015  WBC 9.9 15.3*  --  8.2  NEUTROABS  --  9.9*  --   --   HGB 12.4* 12.9* 13.9 12.5*  HCT 38.6* 40.8 41.0 38.2*  MCV 82.1 84.1  --  80.9  PLT 300 291  --  283   Basic Metabolic Panel: Recent Labs  Lab 02/06/24 2015 02/06/24 2123 02/07/24 1015  NA 135 135 132*  K 4.4 4.6 4.2  CL 100  --  99  CO2 17*  --  21*  GLUCOSE 97  --  172*  BUN 9  --  10  CREATININE 0.75  --  0.66  CALCIUM  8.5*  --  8.5*   Liver Function Tests: Recent Labs  Lab 02/06/24 2015  AST 16  ALT 13  ALKPHOS 70  BILITOT 1.0  PROT 6.7  ALBUMIN 2.8*   CBG: Recent Labs  Lab 02/07/24 0748 02/07/24 1149  GLUCAP 228* 176*     Discharge time spent: greater than 30 minutes.  This record has been created using Conservation officer, historic buildings. Errors have been sought and corrected,but may not always be located. Such creation errors do not reflect on the standard of care.   Signed: Amaryllis Dare, MD [facility] 02/07/2024

## 2024-02-07 NOTE — H&P (Signed)
 History and Physical    Blake Mcknight FMW:[id number] DOB: March 03, 1956 DOA: 02/06/2024  PCP: [name], MD  Patient coming from: Home  Chief Complaint: Shortness of breath  HPI: Blake Mcknight is a 68 y.o. male with medical history significant of COPD on 2 L home oxygen , former smoker, hypertension, hyperlipidemia, type 2 diabetes, schizophrenia, lung cancer status post XRT, history of PE on Eliquis , BPH presented to the ED with shortness of breath and respiratory distress.  Patient took 2 breathing treatments at home with no improvement and was given Solu-Medrol  by EMS.  Labs notable for WBC count 15.3, hemoglobin 12.9 (stable), bicarb 17, anion gap 18, glucose 97, creatinine 0.75, troponin negative x 2, BNP normal, COVID/influenza/RSV PCR negative, VBG with pH 7.35 and pCO2 47, lactic acid normal x 2.    CTA chest showing: IMPRESSION: 1. No evidence of acute pulmonary embolus. 2. Stable chronic thrombus within the right lower lobe pulmonary artery. 3. Persistent masslike consolidation right lower lobe compatible with known history of lung cancer. 4. Progressive subcarinal lymphadenopathy consistent with metastatic disease. 5. Chronic T7 compression deformity, with interval development of T6 and T8 anterior wedge compression deformities. Pathologic fractures cannot be excluded. 6. Partial visualization of the metastatic liver lesion, incompletely visualized due to slice selection. 7. Aortic Atherosclerosis (ICD10-I70.0) and Emphysema (ICD10-J43.9).  Patient was given DuoNeb in the ED and [facility] called to admit for COPD exacerbation.  Patient is reporting 24-hour history of shortness of breath and productive cough.  He is not sure which medications he receives at home as his sister gives them to him but does report receiving 2 breathing treatments prior to EMS arrival.  He reports improvement of his dyspnea now.  Denies chest pain.  No other complaints.  Review of  Systems:  Review of Systems  All other systems reviewed and are negative.   Past Medical History:  Diagnosis Date   Abdominal pain 06/03/2013   Chest pain 08/10/2023   COPD (chronic obstructive pulmonary disease) (HCC)    with bullous emphysema.    COPD exacerbation (HCC) 08/09/2023   Heavy cigarette smoker before 2003   pt claims only 10 cigs per day, never heavier amounts.    HLD (hyperlipidemia)    Hypertension    Schizophrenia (HCC) 06/28/2013   This is a chronic condition and he lives with family    Past Surgical History:  Procedure Laterality Date   CHOLECYSTECTOMY N/A 06/06/2013   Procedure: LAPAROSCOPIC CHOLECYSTECTOMY WITH ATTEMPTED INTRAOPERATIVE CHOLANGIOGRAM;  Surgeon: Dann FORBES Hummer, MD;  Location: [facility] OR;  Service: General;  Laterality: N/A;   ERCP N/A 06/03/2013   Procedure: ENDOSCOPIC RETROGRADE CHOLANGIOPANCREATOGRAPHY (ERCP);  Surgeon: Gwendlyn ONEIDA Buddy, MD;  Location[facility];  Service: Endoscopy;  Laterality: N/A;     reports that he has quit smoking. His smoking use included cigarettes. He has a 41 pack-year smoking history. He has never used smokeless tobacco. He reports that he does not drink alcohol and does not use drugs.  No Known Allergies  Family History  Problem Relation Age of Onset   Diabetes Mellitus II Mother    Diabetes Mother    Heart disease Father    Stroke Maternal Aunt    CAD Neg Hx     Prior to Admission medications   Medication Sig Start Date End Date Taking? Authorizing Provider  acetaminophen  (TYLENOL ) 500 MG tablet Take 2 tablets (1,000 mg total) by mouth every 8 (eight) hours. Patient taking differently: Take 1,000 mg by mouth every  8 (eight) hours as needed for mild pain (pain score 1-3) or headache. 08/14/23   Fausto Burnard LABOR, DO  albuterol  (VENTOLIN  HFA) 108 (90 Base) MCG/ACT inhaler Inhale 2 puffs into the lungs every 4 (four) hours as needed for wheezing or shortness of breath. 10/02/22   Bernard Drivers, MD  apixaban   (ELIQUIS ) 5 MG TABS tablet Take 1 tablet (5 mg total) by mouth 2 (two) times daily. 10/22/23   Shelah Lamar RAMAN, MD  arformoterol  (BROVANA ) 15 MCG/2ML NEBU Take 2 mLs (15 mcg total) by nebulization 2 (two) times daily. 09/10/23   Will Almarie MATSU, MD  atorvastatin  (LIPITOR) 10 MG tablet Take 1 tablet (10 mg total) by mouth at bedtime. 08/14/23   Fausto Burnard A, DO  benztropine  (COGENTIN ) 1 MG tablet Take 1 tablet (1 mg total) by mouth 2 (two) times daily. 02/03/24   Arfeen, Leni DASEN, MD  carvedilol  (COREG ) 3.125 MG tablet Take 2 tablets (6.25 mg total) by mouth 2 (two) times daily with a meal. 09/10/23   Will Almarie MATSU, MD  cetirizine (ZYRTEC) 10 MG tablet Take 10 mg by mouth daily as needed for allergies or rhinitis.    [provider]  cholecalciferol  (VITAMIN D ) 25 MCG (1000 UNIT) tablet Take 5,000 Units by mouth every morning.    [provider]  cyanocobalamin  1000 MCG tablet Take 1,000 mcg by mouth daily.    [provider]  famotidine  (PEPCID ) 40 MG tablet Take 40 mg by mouth 2 (two) times daily. 12/28/18   [provider]  feeding supplement, GLUCERNA SHAKE, (GLUCERNA SHAKE) LIQD Take 237 mLs by mouth 3 (three) times daily between meals. 08/14/23   Fausto Burnard LABOR, DO  Fluticasone -Umeclidin-Vilant (TRELEGY ELLIPTA ) 100-62.5-25 MCG/ACT AEPB Inhale 1 puff into the lungs daily. 01/01/24   Parrett, Madelin RAMAN, NP  hydrOXYzine  (ATARAX ) 25 MG tablet Take 1 tablet (25 mg total) by mouth 3 (three) times daily as needed for anxiety. 10/12/23   Sherrill Cable Latif, DO  ipratropium-albuterol  (DUONEB) 0.5-2.5 (3) MG/3ML SOLN Take 3 mLs by nebulization every 4 (four) hours. Patient taking differently: Take 3 mLs by nebulization every 4 (four) hours as needed. 08/14/23   Fausto Burnard A, DO  JANUVIA 100 MG tablet Take 100 mg by mouth every evening. 01/30/23   [provider]  lidocaine  (LIDODERM ) 5 % Place 1 patch onto the skin daily. Remove & Discard patch  within 12 hours or as directed by MD 09/10/23   Will Almarie MATSU, MD  methocarbamol  (ROBAXIN ) 750 MG tablet Take 1 tablet (750 mg total) by mouth every 8 (eight) hours as needed for muscle spasms. 09/10/23   Will Almarie MATSU, MD  mirtazapine  (REMERON ) 30 MG tablet Take 1 tablet (30 mg total) by mouth at bedtime as needed (insomnia). 02/03/24 02/02/25  Arfeen, Leni DASEN, MD  multivitamin (ONE-A-DAY MEN'S) TABS tablet Take 1 tablet by mouth daily.    [provider]  OXYGEN  Inhale 2 L/min into the lungs continuous.     [provider]  polyethylene glycol (MIRALAX  / GLYCOLAX ) 17 g packet Take 17 g by mouth daily. 09/10/23   Will Almarie MATSU, MD  predniSONE  (DELTASONE ) 20 MG tablet Take 40 mg by mouth daily for 3 days, then 30 mg by mouth daily for 3 days, then go back on your usual maintenance regimen of prednisone  20 mg by mouth daily. 11/29/23   Ghimire, Donalda HERO, MD  risperiDONE  (RISPERDAL ) 2 MG tablet Take 1 tablet (2 mg total) by mouth  2 (two) times daily. 02/03/24   Arfeen, Leni DASEN, MD  sodium chloride  (OCEAN) 0.65 % SOLN nasal spray Place 1 spray into both nostrils as needed for congestion.    [provider]  tamsulosin  (FLOMAX ) 0.4 MG CAPS capsule Take 0.4 mg by mouth in the morning and at bedtime. 07/23/20   [provider]  traMADol  (ULTRAM ) 50 MG tablet Take 1 tablet (50 mg total) by mouth every 6 (six) hours as needed. 10/21/23   Shelah Lamar RAMAN, MD  TRELEGY ELLIPTA  100-62.5-25 MCG/ACT AEPB Inhale 1 puff into the lungs daily. 08/29/23   [provider]    Physical Exam: Vitals:   02/07/24 0338 02/07/24 0402 02/07/24 0449 02/07/24 0500  BP:  (!) 138/90    Pulse: 95 (!) 109 (!) 102 100  Resp: (!) 22 (!) 22 20 14   Temp:  98.6 F (37 C)    TempSrc:  Oral    SpO2: 100% 95% 97% 96%    Physical Exam Vitals reviewed.  Constitutional:      General: He is not in acute distress. HENT:     Head: Normocephalic and atraumatic.  Eyes:      Extraocular Movements: Extraocular movements intact.  Cardiovascular:     Rate and Rhythm: Normal rate and regular rhythm.     Heart sounds: Normal heart sounds.  Pulmonary:     Effort: Pulmonary effort is normal. No respiratory distress.     Breath sounds: Wheezing present.     Comments: Mild wheezing Abdominal:     General: Bowel sounds are normal.     Palpations: Abdomen is soft.     Tenderness: There is no abdominal tenderness. There is no guarding.  Musculoskeletal:     Cervical back: Normal range of motion.     Right lower leg: No edema.     Left lower leg: No edema.  Skin:    General: Skin is warm and dry.  Neurological:     General: No focal deficit present.     Mental Status: He is alert and oriented to person, place, and time.     Labs on Admission: I have personally reviewed following labs and imaging studies  CBC: Recent Labs  Lab 02/04/24 1447 02/06/24 2015 02/06/24 2123  WBC 9.9 15.3*  --   NEUTROABS  --  9.9*  --   HGB 12.4* 12.9* 13.9  HCT 38.6* 40.8 41.0  MCV 82.1 84.1  --   PLT 300 291  --    Basic Metabolic Panel: Recent Labs  Lab 02/06/24 2015 02/06/24 2123  NA 135 135  K 4.4 4.6  CL 100  --   CO2 17*  --   GLUCOSE 97  --   BUN 9  --   CREATININE 0.75  --   CALCIUM  8.5*  --    GFR: Estimated Creatinine Clearance: 82 mL/min (by C-G formula based on SCr of 0.75 mg/dL). Liver Function Tests: Recent Labs  Lab 02/06/24 2015  AST 16  ALT 13  ALKPHOS 70  BILITOT 1.0  PROT 6.7  ALBUMIN 2.8*   No results for input(s): LIPASE, AMYLASE in the last 168 hours. No results for input(s): AMMONIA in the last 168 hours. Coagulation Profile: Recent Labs  Lab 02/04/24 1447  INR 1.0   Cardiac Enzymes: No results for input(s): CKTOTAL, CKMB, CKMBINDEX, TROPONINI in the last 168 hours. BNP (last 3 results) No results for input(s): PROBNP in the last 8760 hours. HbA1C: No results for input(s): HGBA1C  in the last 72  hours. CBG: No results for input(s): GLUCAP in the last 168 hours. Lipid Profile: No results for input(s): CHOL, HDL, LDLCALC, TRIG, CHOLHDL, LDLDIRECT in the last 72 hours. Thyroid Function Tests: No results for input(s): TSH, T4TOTAL, FREET4, T3FREE, THYROIDAB in the last 72 hours. Anemia Panel: No results for input(s): VITAMINB12, FOLATE, FERRITIN, TIBC, IRON, RETICCTPCT in the last 72 hours. Urine analysis:    Component Value Date/Time   COLORURINE YELLOW 04/16/2023 0616   APPEARANCEUR CLEAR 04/16/2023 0616   LABSPEC 1.016 04/16/2023 0616   PHURINE 6.0 04/16/2023 0616   GLUCOSEU 150 (A) 04/16/2023 0616   HGBUR NEGATIVE 04/16/2023 0616   BILIRUBINUR NEGATIVE 04/16/2023 0616   KETONESUR NEGATIVE 04/16/2023 0616   PROTEINUR NEGATIVE 04/16/2023 0616   UROBILINOGEN 0.2 01/29/2019 1634   NITRITE NEGATIVE 04/16/2023 0616   LEUKOCYTESUR NEGATIVE 04/16/2023 0616    Radiological Exams on Admission: CT Angio Chest PE W and/or Wo Contrast Result Date: 02/06/2024 CLINICAL DATA:  Shortness of breath, known metastatic lung cancer EXAM: CT ANGIOGRAPHY CHEST WITH CONTRAST TECHNIQUE: Multidetector CT imaging of the chest was performed using the standard protocol during bolus administration of intravenous contrast. Multiplanar CT image reconstructions and MIPs were obtained to evaluate the vascular anatomy. RADIATION DOSE REDUCTION: This exam was performed according to the departmental dose-optimization program which includes automated exposure control, adjustment of the mA and/or kV according to patient size and/or use of iterative reconstruction technique. CONTRAST:  75mL OMNIPAQUE  IOHEXOL  350 MG/ML SOLN COMPARISON:  12/08/2023, 09/30/2023 FINDINGS: Cardiovascular: This is a technically adequate evaluation of the pulmonary vasculature. Stable chronic thrombus within the right lower lobe pulmonary artery. No acute filling defects or evidence of acute pulmonary  emboli. The heart is unremarkable without pericardial effusion. No evidence of thoracic aortic aneurysm or dissection. Atherosclerosis of the aorta and coronary vasculature. Mediastinum/Nodes: Progressive subcarinal lymphadenopathy measuring 4.3 x 2.9 cm reference image 92/5. Thyroid, trachea, and esophagus are stable. Lungs/Pleura: Severe emphysematous changes are again noted. Peripheral masslike consolidation within the right lower lobe again noted, measuring 3.6 x 5.3 cm. Trace right pleural effusion. No pneumothorax. Upper Abdomen: Partial visualization of the metastatic lesion within the inferior right lobe liver noted on prior PET scan, incompletely assessed due to slice selection. No acute upper abdominal findings. Musculoskeletal: There is a chronic T7 compression deformity and resulting vertebra plana. Since the prior exams, compression deformities have developed at T6 and T8 with approximately 50% loss of height. Pathologic fractures cannot be excluded. Reconstructed images demonstrate no additional findings. Review of the MIP images confirms the above findings. IMPRESSION: 1. No evidence of acute pulmonary embolus. 2. Stable chronic thrombus within the right lower lobe pulmonary artery. 3. Persistent masslike consolidation right lower lobe compatible with known history of lung cancer. 4. Progressive subcarinal lymphadenopathy consistent with metastatic disease. 5. Chronic T7 compression deformity, with interval development of T6 and T8 anterior wedge compression deformities. Pathologic fractures cannot be excluded. 6. Partial visualization of the metastatic liver lesion, incompletely visualized due to slice selection. 7. Aortic Atherosclerosis (ICD10-I70.0) and Emphysema (ICD10-J43.9). Electronically Signed   By: Ozell Daring M.D.   On: 02/06/2024 22:52    EKG: Independently reviewed.  Sinus tachycardia, baseline wander, PVCs.  Assessment and Plan  Acute COPD exacerbation COVID/influenza/RSV PCR  negative.  WBC count 15.3, no fever.  CTA chest negative for acute PE and showing persistent masslike consolidation in the right lower lobe which per PET scan done in September 2025 had hypermetabolism and was felt to  be due to neoplasm over pneumonia.  Patient was treated with antibiotics for pneumonia during his hospitalization in September.  Check procalcitonin level, and if elevated, start antibiotics.  Blood gas without evidence of hypercarbia.  Work of breathing has now significantly improved and not hypoxic, however, continues to have mild wheezing.  Continue treatment with prednisone  40 mg daily x 5 days, DuoNeb every 6 hours, Pulmicort  neb twice daily, albuterol  neb every 2 hours as needed, and Mucinex .  Mild high anion gap metabolic acidosis No hyperglycemia to suggest DKA/not on SGLT 2 inhibitor to suggest euglycemic DKA.  No evidence of AKI on labs.  IV fluid hydration and monitor labs.  Lung cancer status post XRT PET scan done 12/08/2023 was showing possible progression of lung cancer with new concerns in the lymph nodes, right lower lobe, liver, and spine.  Patient is followed by pulmonology and IR.  He underwent recent liver biopsy 3 days ago and result pending.  Outpatient follow-up.  Pathologic vertebral compression fractures In the setting of suspected metastatic lung cancer with spinal mets.  CT showing chronic T7 compression deformity with interval development of T6 and T8 anterior wedge compression deformities.  Patient will need outpatient follow-up as above.  Continue pain management.  Type 2 diabetes Hemoglobin A1c 5.9 in May 2025.  Placed on sensitive sliding scale insulin  [facility].  Hypertension: Currently normotensive. Hyperlipidemia Schizophrenia History of PE BPH Unable to verify home medications with the patient.  Continue Eliquis  given history of PE but otherwise remainder of his home meds need to be verified once his sister is available.  DVT prophylaxis:  Eliquis  Code Status: Discussed CODE STATUS with the patient and he wishes to be FULL CODE.  Per review of chart, he was listed as DNR/DNI during previous hospitalizations but FULL CODE during his recent IR procedure 3 days ago.  CODE STATUS needs to be verified during daytime once patient's sister is available. Family Communication: No family available at this time. Level of care: Telemetry bed Admission status: It is my clinical opinion that referral for OBSERVATION is reasonable and necessary in this patient based on the above information provided. The aforementioned taken together are felt to place the patient at high risk for further clinical deterioration. However, it is anticipated that the patient may be medically stable for discharge from the hospital within 24 to 48 hours.  Editha Ram MD [facility]  If 7PM-7AM, please contact night-coverage [email or web address]  02/07/2024, 6:13 AM

## 2024-02-07 NOTE — ED Notes (Signed)
 Rico (brother) [phone]

## 2024-02-07 NOTE — ED Notes (Signed)
 Attending Sumayya at bedside.

## 2024-02-08 ENCOUNTER — Ambulatory Visit: Payer: Self-pay | Admitting: Adult Health

## 2024-02-08 LAB — SURGICAL PATHOLOGY

## 2024-02-16 ENCOUNTER — Encounter: Payer: Self-pay | Admitting: Adult Health

## 2024-02-16 ENCOUNTER — Ambulatory Visit: Admitting: Adult Health

## 2024-02-16 VITALS — BP 118/66 | HR 106 | Temp 98.2°F | Ht 66.0 in | Wt 152.0 lb

## 2024-02-16 DIAGNOSIS — R160 Hepatomegaly, not elsewhere classified: Secondary | ICD-10-CM | POA: Diagnosis not present

## 2024-02-16 DIAGNOSIS — J9611 Chronic respiratory failure with hypoxia: Secondary | ICD-10-CM

## 2024-02-16 DIAGNOSIS — C3431 Malignant neoplasm of lower lobe, right bronchus or lung: Secondary | ICD-10-CM | POA: Diagnosis not present

## 2024-02-16 DIAGNOSIS — J449 Chronic obstructive pulmonary disease, unspecified: Secondary | ICD-10-CM | POA: Diagnosis not present

## 2024-02-16 DIAGNOSIS — Z85118 Personal history of other malignant neoplasm of bronchus and lung: Secondary | ICD-10-CM

## 2024-02-16 NOTE — Patient Instructions (Addendum)
 Refer to Oncology.  Continue on Trelegy 1 puff daily  Albuterol  inhaler or DuoNeb nebulizer as needed Activity as tolerated Continue on Oxygen  2l/m, to keep O2 sats >90%.  Mucinex  DM Twice daily  As needed   Follow up with Dr. Shelah or Denette Hass NP  in 3 months and As needed

## 2024-02-16 NOTE — Progress Notes (Signed)
 @Patient  ID: Blake Mcknight, male    DOB: 12/02/1955, 68 y.o.   MRN: [id number]  Chief Complaint  Patient presents with   Follow-up    Biopsy f/u    Referring provider: [name], MD  HPI: 68 year old male followed for severe COPD with emphysema and chronic respiratory failure and pulmonary nodular disease-abnormal CT chest with subcarinal adenopathy and waxing and waning nodularity-presumed lung cancer with suspicious right lower lobe mass s/p XRT, pulmonary embolism diagnosed July 2025 Medical history significant for paranoid schizophrenia    TEST/EVENTS : Reviewed 02/16/2024  CT chest September 04, 2023 negative for PE, masslike consolidation right lower lobe no significant change since Aug 09, 2023, enlarged subcarinal lymphadenopathy, severe emphysema   CT chest September 30, 2023 acute right lower lobe pulmonary embolism with peripheral thrombus causing narrowing of the right lower lobe pulmonary artery, evidence of right heart strain, similar masslike consolidative findings in the right lower lobe, questionable 8 mm pancreatic cystic lesion similar to 3 years ago  PET scan was set up for December 08, 2023 that showed progressive subcarinal nodal metastasis, increased RLL hypermetabolism, new right hepatic lob mets, and vertebral hypermetabolism.   Liver Bx 02/04/24 -pathology ++ metastatic poorly differentiated squamous cell carcinoma   2015 PFTs showed severe airflow obstruction with FEV1 at 35%, ratio 53.   Discussed the use of AI scribe software for clinical note transcription with the patient, who gave verbal consent to proceed.  History of Present Illness Blake Mcknight is a 68 year old male with metastatic lung cancer who presents for follow-up regarding recent biopsy results. He is accompanied by his sister, Blake Mcknight, who is his primary caregiver.   Patient has a history of presumed lung cancer previously evaluated by oncology and radiation oncology.  Underwent  empiric radiation to presumed right lower lobe lung mass 2024. SABRA  CT chest in June 2025 showed enlarged subcarinal lymphadenopathy.  Patient was set up for a subsequent PET scan that was completed on December 08, 2023 that showed progressive subcarinal nodal metastasis, increased right lower lobe lung mass with hypermetabolism, new right hepatic lobe metastasis, right rib and vertebral hyper metabolism concerning for possible metastasis.  Patient underwent liver biopsy on February 04, 2024 pathology was positive for metastatic poorly differentiated squamous cell carcinoma.  We discussed in detail his test results.  Patient is disabled.  He is accompanied by his sister who is his primary caregiver Blake Mcknight.  Patient says he does not understand and that his sister makes decisions for him.  Discussed in detail his test results with sister today.  We talked about referral to oncology to discuss possible treatment.  He has a long-standing history of COPD, managed with Trelegy inhaler, which has improved his breathing. He has been able to reduce his oxygen  use, maintaining oxygen  levels between 91-97%.  He also has a history of schizophrenia, diagnosed over 35 years ago, which is well-managed with medication.   He lives with his sister, who has been his primary caregiver, and has been managing his medical decisions due to his limited understanding of his condition. He has not been able to care for himself due to his schizophrenia and other health issues for his adult life.   Denies any increased cough or congestion.  Does feel that breathing is doing better since restarting Trelegy last visit.  No hemoptysis or unintentional weight loss.  Appetite is fair.     No Known Allergies  Immunization History  Administered Date(s) Administered  Fluad Trivalent(High Dose 65+) 12/06/2022   INFLUENZA, HIGH DOSE SEASONAL PF 01/01/2024   Influenza Split 12/15/2012   Influenza Whole 11/18/2021    Influenza,inj,Quad PF,6+ Mos 01/21/2018   PFIZER Comirnaty(Gray Top)Covid-19 Tri-Sucrose Vaccine 08/04/2020   Pneumococcal Polysaccharide-23 06/08/2013    Past Medical History:  Diagnosis Date   Abdominal pain 06/03/2013   Chest pain 08/10/2023   COPD (chronic obstructive pulmonary disease) (HCC)    with bullous emphysema.    COPD exacerbation (HCC) 08/09/2023   Heavy cigarette smoker before 2003   pt claims only 10 cigs per day, never heavier amounts.    HLD (hyperlipidemia)    Hypertension    Schizophrenia (HCC) 06/28/2013   This is a chronic condition and he lives with family    Tobacco History: Social History   Tobacco Use  Smoking Status Former   Current packs/day: 1.00   Average packs/day: 1 pack/day for 41.0 years (41.0 ttl pk-yrs)   Types: Cigarettes  Smokeless Tobacco Never  Tobacco Comments   Quit smoking 2023   Counseling given: Not Answered Tobacco comments: Quit smoking 2023   Outpatient Medications Prior to Visit  Medication Sig Dispense Refill   acetaminophen  (TYLENOL ) 500 MG tablet Take 2 tablets (1,000 mg total) by mouth every 8 (eight) hours. 30 tablet 0   albuterol  (PROVENTIL ) (2.5 MG/3ML) 0.083% nebulizer solution Take 2.5 mg by nebulization every 6 (six) hours as needed.     albuterol  (VENTOLIN  HFA) 108 (90 Base) MCG/ACT inhaler Inhale 2 puffs into the lungs every 4 (four) hours as needed for wheezing or shortness of breath. 1 each 1   apixaban  (ELIQUIS ) 5 MG TABS tablet Take 1 tablet (5 mg total) by mouth 2 (two) times daily.     Artificial Tear Ointment (DRY EYES OP) Place 1 drop into both eyes 2 (two) times daily as needed (itichy eyes).     atorvastatin  (LIPITOR) 10 MG tablet Take 1 tablet (10 mg total) by mouth at bedtime. 30 tablet 1   benztropine  (COGENTIN ) 1 MG tablet Take 1 tablet (1 mg total) by mouth 2 (two) times daily. 180 tablet 0   carvedilol  (COREG ) 3.125 MG tablet Take 2 tablets (6.25 mg total) by mouth 2 (two) times daily with a  meal.     cetirizine (ZYRTEC) 10 MG tablet Take 10 mg by mouth daily as needed for allergies or rhinitis.     cholecalciferol  (VITAMIN D ) 25 MCG (1000 UNIT) tablet Take 2,000-4,000 Units by mouth every morning.     cyanocobalamin  1000 MCG tablet Take 1,000 mcg by mouth daily. (Patient taking differently: Take 1,000 mcg by mouth as needed.)     famotidine  (PEPCID ) 40 MG tablet Take 40 mg by mouth 2 (two) times daily.     Fluticasone -Umeclidin-Vilant (TRELEGY ELLIPTA ) 100-62.5-25 MCG/ACT AEPB Inhale 1 puff into the lungs daily.     guaiFENesin  (MUCINEX ) 600 MG 12 hr tablet Take 1 tablet (600 mg total) by mouth 2 (two) times daily as needed. 30 tablet 0   hydrOXYzine  (ATARAX ) 25 MG tablet Take 1 tablet (25 mg total) by mouth 3 (three) times daily as needed for anxiety. 30 tablet 0   ipratropium-albuterol  (DUONEB) 0.5-2.5 (3) MG/3ML SOLN Take 3 mLs by nebulization every 4 (four) hours. (Patient taking differently: Take 3 mLs by nebulization every 4 (four) hours as needed.)     JANUVIA 100 MG tablet Take 100 mg by mouth every evening.     lidocaine  (LIDODERM ) 5 % Place 1 patch onto the skin daily.  Remove & Discard patch within 12 hours or as directed by MD (Patient taking differently: Place 1 patch onto the skin daily as needed (pain). Remove & Discard patch within 12 hours or as directed by MD) 30 patch 0   methocarbamol  (ROBAXIN ) 750 MG tablet Take 1 tablet (750 mg total) by mouth every 8 (eight) hours as needed for muscle spasms. 30 tablet 0   mirtazapine  (REMERON ) 30 MG tablet Take 1 tablet (30 mg total) by mouth at bedtime as needed (insomnia). 90 tablet 0   multivitamin (ONE-A-DAY MEN'S) TABS tablet Take 1 tablet by mouth daily.     OXYGEN  Inhale 2 L/min into the lungs continuous.      polyethylene glycol (MIRALAX  / GLYCOLAX ) 17 g packet Take 17 g by mouth daily. 14 each 0   predniSONE  (DELTASONE ) 20 MG tablet Take 40 mg by mouth daily for 3 days, then 30 mg by mouth daily for 3 days, then go back  on your usual maintenance regimen of prednisone  20 mg by mouth daily. (Patient taking differently: Take 20 mg by mouth daily with breakfast. For congestion) 30 tablet 1   risperiDONE  (RISPERDAL ) 2 MG tablet Take 1 tablet (2 mg total) by mouth 2 (two) times daily. 180 tablet 0   sodium chloride  (OCEAN) 0.65 % SOLN nasal spray Place 1 spray into both nostrils as needed for congestion.     tamsulosin  (FLOMAX ) 0.4 MG CAPS capsule Take 0.4 mg by mouth in the morning and at bedtime.     traMADol  (ULTRAM ) 50 MG tablet Take 1 tablet (50 mg total) by mouth every 6 (six) hours as needed. 40 tablet 0   feeding supplement, GLUCERNA SHAKE, (GLUCERNA SHAKE) LIQD Take 237 mLs by mouth 3 (three) times daily between meals. (Patient not taking: Reported on 02/16/2024)     No facility-administered medications prior to visit.     Review of Systems:   Constitutional:   No  weight loss, night sweats,  Fevers, chills, +fatigue, or  lassitude.  HEENT:   No headaches,  Difficulty swallowing,  Tooth/dental problems, or  Sore throat,                No sneezing, itching, ear ache, nasal congestion, post nasal drip,   CV:  No chest pain,  Orthopnea, PND, swelling in lower extremities, anasarca, dizziness, palpitations, syncope.   GI  No heartburn, indigestion, abdominal pain, nausea, vomiting, diarrhea, change in bowel habits, loss of appetite, bloody stools.   Resp: .  No chest wall deformity  Skin: no rash or lesions.  GU: no dysuria, change in color of urine, no urgency or frequency.  No flank pain, no hematuria   MS:  No joint pain or swelling.  No decreased range of motion.  No back pain.    Physical Exam  BP 118/66   Pulse (!) 106   Temp 98.2 F (36.8 C)   Ht 5' 6 (1.676 m) Comment: Per pt  Wt 152 lb (68.9 kg)   SpO2 93% Comment: RA  BMI 24.53 kg/m   GEN: A/Ox3; pleasant , NAD, well nourished    HEENT:  [place]/AT,  NOSE-clear, THROAT-clear, no lesions, no postnasal drip or exudate noted.   NECK:   Supple w/ fair ROM; no JVD; normal carotid impulses w/o bruits; no thyromegaly or nodules palpated; no lymphadenopathy.    RESP  Clear  P & A; w/o, wheezes/ rales/ or rhonchi. no accessory muscle use, no dullness to percussion  CARD:  RRR, no  m/r/g, no peripheral edema, pulses intact, no cyanosis or clubbing.  GI:   Soft & nt; nml bowel sounds; no organomegaly or masses detected.   Musco: Warm bil, no deformities or joint swelling noted.   Neuro: alert, no focal deficits noted.    Skin: Warm, no lesions or rashes    Lab Results:Reviewed 02/16/2024   CBC    Component Value Date/Time   WBC 8.2 02/07/2024 1015   RBC 4.72 02/07/2024 1015   HGB 12.5 (L) 02/07/2024 1015   HGB 13.5 10/21/2022 1320   HCT 38.2 (L) 02/07/2024 1015   PLT 283 02/07/2024 1015   PLT 281 10/21/2022 1320   MCV 80.9 02/07/2024 1015   MCH 26.5 02/07/2024 1015   MCHC 32.7 02/07/2024 1015   RDW 15.1 02/07/2024 1015   LYMPHSABS 2.9 02/06/2024 2015   MONOABS 1.8 (H) 02/06/2024 2015   EOSABS 0.6 (H) 02/06/2024 2015   BASOSABS 0.1 02/06/2024 2015    BMET    Component Value Date/Time   NA 132 (L) 02/07/2024 1015   K 4.2 02/07/2024 1015   CL 99 02/07/2024 1015   CO2 21 (L) 02/07/2024 1015   GLUCOSE 172 (H) 02/07/2024 1015   BUN 10 02/07/2024 1015   CREATININE 0.66 02/07/2024 1015   CREATININE 0.75 10/21/2022 1320   CALCIUM  8.5 (L) 02/07/2024 1015   GFRNONAA >60 02/07/2024 1015   GFRNONAA >60 10/21/2022 1320   GFRAA >60 09/05/2019 1233    BNP    Component Value Date/Time   BNP 54.9 02/06/2024 2015    ProBNP No results found for: PROBNP  Imaging: CT Angio Chest PE W and/or Wo Contrast Result Date: 02/06/2024 CLINICAL DATA:  Shortness of breath, known metastatic lung cancer EXAM: CT ANGIOGRAPHY CHEST WITH CONTRAST TECHNIQUE: Multidetector CT imaging of the chest was performed using the standard protocol during bolus administration of intravenous contrast. Multiplanar CT image reconstructions  and MIPs were obtained to evaluate the vascular anatomy. RADIATION DOSE REDUCTION: This exam was performed according to the departmental dose-optimization program which includes automated exposure control, adjustment of the mA and/or kV according to patient size and/or use of iterative reconstruction technique. CONTRAST:  75mL OMNIPAQUE  IOHEXOL  350 MG/ML SOLN COMPARISON:  12/08/2023, 09/30/2023 FINDINGS: Cardiovascular: This is a technically adequate evaluation of the pulmonary vasculature. Stable chronic thrombus within the right lower lobe pulmonary artery. No acute filling defects or evidence of acute pulmonary emboli. The heart is unremarkable without pericardial effusion. No evidence of thoracic aortic aneurysm or dissection. Atherosclerosis of the aorta and coronary vasculature. Mediastinum/Nodes: Progressive subcarinal lymphadenopathy measuring 4.3 x 2.9 cm reference image 92/5. Thyroid, trachea, and esophagus are stable. Lungs/Pleura: Severe emphysematous changes are again noted. Peripheral masslike consolidation within the right lower lobe again noted, measuring 3.6 x 5.3 cm. Trace right pleural effusion. No pneumothorax. Upper Abdomen: Partial visualization of the metastatic lesion within the inferior right lobe liver noted on prior PET scan, incompletely assessed due to slice selection. No acute upper abdominal findings. Musculoskeletal: There is a chronic T7 compression deformity and resulting vertebra plana. Since the prior exams, compression deformities have developed at T6 and T8 with approximately 50% loss of height. Pathologic fractures cannot be excluded. Reconstructed images demonstrate no additional findings. Review of the MIP images confirms the above findings. IMPRESSION: 1. No evidence of acute pulmonary embolus. 2. Stable chronic thrombus within the right lower lobe pulmonary artery. 3. Persistent masslike consolidation right lower lobe compatible with known history of lung cancer. 4.  Progressive subcarinal lymphadenopathy consistent  with metastatic disease. 5. Chronic T7 compression deformity, with interval development of T6 and T8 anterior wedge compression deformities. Pathologic fractures cannot be excluded. 6. Partial visualization of the metastatic liver lesion, incompletely visualized due to slice selection. 7. Aortic Atherosclerosis (ICD10-I70.0) and Emphysema (ICD10-J43.9). Electronically Signed   By: Ozell Daring M.D.   On: 02/06/2024 22:52   US  BIOPSY (LIVER) Result Date: 02/04/2024 INDICATION: Right lower lobe lung mass, nodal metastatic disease and right liver mass likely representing metastatic disease. The patient presents for liver mass biopsy. EXAM: ULTRASOUND GUIDED CORE BIOPSY OF LIVER MEDICATIONS: None. ANESTHESIA/SEDATION: Moderate (conscious) sedation was employed during this procedure. A total of Versed  0.5 mg and Fentanyl  25 mcg was administered intravenously. Moderate Sedation Time: 15 minutes. The patient's level of consciousness and vital signs were monitored continuously by radiology nursing throughout the procedure under my direct supervision. PROCEDURE: The procedure, risks, benefits, and alternatives were explained to the patient. Questions regarding the procedure were encouraged and answered. The patient understands and consents to the procedure. A time out was performed prior to initiating the procedure. The abdominal wall was prepped with chlorhexidine  in a sterile fashion, and a sterile drape was applied covering the operative field. A sterile gown and sterile gloves were used for the procedure. Local anesthesia was provided with 1% Lidocaine . Ultrasound was used to localize a mass within the right lobe of the liver. Under ultrasound guidance, a 17 gauge trocar needle was advanced to the level of the lesion. Three separate coaxial 18 gauge core biopsy samples were obtained through the mass. Core biopsy samples were submitted in formalin. A slurry of  Gel-Foam EmboCubes mixed in saline was then injected via the outer needle as the needle was retracted and removed. Additional ultrasound was performed. COMPLICATIONS: None immediate. FINDINGS: Ultrasound demonstrates a hypoechoic mass within the inferior aspect of the right lobe of the liver measuring roughly 4.7 x 2.3 x 3.6 cm. Solid tissue was obtained from the lesion. IMPRESSION: Ultrasound-guided core biopsy performed of a mass within the inferior right lobe of the liver measuring up to 4.7 cm by ultrasound. Electronically Signed   By: Marcey Moan M.D.   On: 02/04/2024 16:48    Administration History     None          Latest Ref Rng & Units 07/26/2013   10:51 AM  PFT Results  FVC-Pre L 1.95  P  FVC-Predicted Pre % 53  P  FVC-Post L 2.02  P  FVC-Predicted Post % 55  P  Pre FEV1/FVC % % 53  P  Post FEV1/FCV % % 52  P  FEV1-Pre L 1.03  P  FEV1-Predicted Pre % 35  P  FEV1-Post L 1.05  P    P Preliminary result    No results found for: NITRICOXIDE      No data to display              Assessment & Plan:   Assessment and Plan Assessment & Plan Metastatic lung cancer with liver and lymph node involvement   Biopsy confirmed metastasis from the lungs to the liver, with cancer progressing to stage 4, involving the liver, lymph nodes, and possibly bones.   He does not fully understand the diagnosis implications, with his sister as the primary decision-maker. She is hesitant about aggressive treatments like chemotherapy due to potential side effects and past radiation experiences causing confusion. She is open to discussing options with an oncologist to make an informed decision. Referred to  oncology for further evaluation and management options  Consider palliative care or hospice if aggressive treatment is not pursued. Held off on MRI of the brain until further discussion with oncology.  Chronic obstructive pulmonary disease (COPD)  -Severe  COPD is well-managed with  improved breathing since restarting Trelegy.   Chronic respiratory failure.  Continue on oxygen  2 L to maintain O2 saturations greater than 88 to 9%.  Appears stable.    Schizophrenia -Disabled -appears stable  Long-standing schizophrenia is well-managed with the current medication regimen. Continue current medication regimen for schizophrenia.  Plan  Patient Instructions  Refer to Oncology.  Continue on Trelegy 1 puff daily  Albuterol  inhaler or DuoNeb nebulizer as needed Activity as tolerated Continue on Oxygen  2l/m, to keep O2 sats >90%.  Mucinex  DM Twice daily  As needed   Follow up with Dr. Shelah or Blake Frakes NP  in 3 months and As needed          Blake Stank, NP 02/16/2024  I spent  41  minutes dedicated to the care of this patient on the date of this encounter to include pre-visit review of records, face-to-face time with the patient discussing conditions above, post visit ordering of testing, clinical documentation with the electronic health record, making appropriate referrals as documented, and communicating necessary findings to members of the patients care team.

## 2024-02-25 ENCOUNTER — Inpatient Hospital Stay: Attending: Internal Medicine

## 2024-02-25 ENCOUNTER — Inpatient Hospital Stay: Admitting: Internal Medicine

## 2024-02-25 VITALS — BP 126/74 | HR 88 | Temp 97.8°F | Resp 17 | Ht 66.0 in | Wt 136.0 lb

## 2024-02-25 DIAGNOSIS — M4854XA Collapsed vertebra, not elsewhere classified, thoracic region, initial encounter for fracture: Secondary | ICD-10-CM | POA: Insufficient documentation

## 2024-02-25 DIAGNOSIS — I8291 Chronic embolism and thrombosis of unspecified vein: Secondary | ICD-10-CM | POA: Insufficient documentation

## 2024-02-25 DIAGNOSIS — Z923 Personal history of irradiation: Secondary | ICD-10-CM | POA: Diagnosis not present

## 2024-02-25 DIAGNOSIS — Z87891 Personal history of nicotine dependence: Secondary | ICD-10-CM | POA: Diagnosis not present

## 2024-02-25 DIAGNOSIS — Z79899 Other long term (current) drug therapy: Secondary | ICD-10-CM | POA: Insufficient documentation

## 2024-02-25 DIAGNOSIS — C779 Secondary and unspecified malignant neoplasm of lymph node, unspecified: Secondary | ICD-10-CM | POA: Insufficient documentation

## 2024-02-25 DIAGNOSIS — R918 Other nonspecific abnormal finding of lung field: Secondary | ICD-10-CM

## 2024-02-25 DIAGNOSIS — C3431 Malignant neoplasm of lower lobe, right bronchus or lung: Secondary | ICD-10-CM | POA: Diagnosis present

## 2024-02-25 DIAGNOSIS — I2510 Atherosclerotic heart disease of native coronary artery without angina pectoris: Secondary | ICD-10-CM | POA: Diagnosis not present

## 2024-02-25 DIAGNOSIS — F209 Schizophrenia, unspecified: Secondary | ICD-10-CM | POA: Diagnosis not present

## 2024-02-25 DIAGNOSIS — I700 Atherosclerosis of aorta: Secondary | ICD-10-CM | POA: Diagnosis not present

## 2024-02-25 DIAGNOSIS — Z7901 Long term (current) use of anticoagulants: Secondary | ICD-10-CM | POA: Insufficient documentation

## 2024-02-25 DIAGNOSIS — C787 Secondary malignant neoplasm of liver and intrahepatic bile duct: Secondary | ICD-10-CM | POA: Diagnosis not present

## 2024-02-25 DIAGNOSIS — Z9049 Acquired absence of other specified parts of digestive tract: Secondary | ICD-10-CM | POA: Diagnosis not present

## 2024-02-25 DIAGNOSIS — J90 Pleural effusion, not elsewhere classified: Secondary | ICD-10-CM | POA: Diagnosis not present

## 2024-02-25 DIAGNOSIS — G8929 Other chronic pain: Secondary | ICD-10-CM | POA: Insufficient documentation

## 2024-02-25 LAB — CMP (CANCER CENTER ONLY)
ALT: 12 U/L (ref 0–44)
AST: 13 U/L — ABNORMAL LOW (ref 15–41)
Albumin: 3.9 g/dL (ref 3.5–5.0)
Alkaline Phosphatase: 82 U/L (ref 38–126)
Anion gap: 10 (ref 5–15)
BUN: 6 mg/dL — ABNORMAL LOW (ref 8–23)
CO2: 28 mmol/L (ref 22–32)
Calcium: 9.6 mg/dL (ref 8.9–10.3)
Chloride: 99 mmol/L (ref 98–111)
Creatinine: 0.76 mg/dL (ref 0.61–1.24)
GFR, Estimated: >60 mL/min (ref >60)
Glucose, Bld: 96 mg/dL (ref 70–99)
Potassium: 4.2 mmol/L (ref 3.5–5.1)
Sodium: 137 mmol/L (ref 135–145)
Total Bilirubin: 0.5 mg/dL (ref 0.0–1.2)
Total Protein: 7.5 g/dL (ref 6.5–8.1)

## 2024-02-25 LAB — CBC WITH DIFFERENTIAL (CANCER CENTER ONLY)
Abs Immature Granulocytes: 0.04 K/uL (ref 0.00–0.07)
Basophils Absolute: 0.1 K/uL (ref 0.0–0.1)
Basophils Relative: 1 %
Eosinophils Absolute: 0.2 K/uL (ref 0.0–0.5)
Eosinophils Relative: 2 %
HCT: 39.6 % (ref 39.0–52.0)
Hemoglobin: 13 g/dL (ref 13.0–17.0)
Immature Granulocytes: 0 %
Lymphocytes Relative: 17 %
Lymphs Abs: 1.9 K/uL (ref 0.7–4.0)
MCH: 26.4 pg (ref 26.0–34.0)
MCHC: 32.8 g/dL (ref 30.0–36.0)
MCV: 80.3 fL (ref 80.0–100.0)
Monocytes Absolute: 1.3 K/uL — ABNORMAL HIGH (ref 0.1–1.0)
Monocytes Relative: 11 %
Neutro Abs: 7.7 K/uL (ref 1.7–7.7)
Neutrophils Relative %: 69 %
Platelet Count: 317 K/uL (ref 150–400)
RBC: 4.93 MIL/uL (ref 4.22–5.81)
RDW: 15.9 % — ABNORMAL HIGH (ref 11.5–15.5)
WBC Count: 11.2 K/uL — ABNORMAL HIGH (ref 4.0–10.5)
nRBC: 0 % (ref 0.0–0.2)

## 2024-02-25 NOTE — Progress Notes (Signed)
 [facility] [phone]   [phone]  OFFICE PROGRESS NOTE  [name], MD [address] [place] [address]  DIAGNOSIS: Stage IV (T2b, N2, M1 C) non-small cell lung cancer, squamous cell carcinoma presented with right lower lobe lung mass in addition to subcarinal lymphadenopathy as well as hepatic metastasis diagnosed in November 2025.   PRIOR THERAPY: None  CURRENT THERAPY: Discussion of palliative systemic chemoimmunotherapy with carboplatin for AUC of 5, paclitaxel 175 MGs/M2 and Libtayo (Cempilimab) 350 mg IV every 3 weeks versus palliative care on hospice.  The patient and his sister will make a decision and call us .  INTERVAL HISTORY: Blake Mcknight 68 y.o. male returns to the clinic today for  Discussed the use of AI scribe software for clinical note transcription with the patient, who gave verbal consent to proceed.  History of Present Illness Blake Mcknight is a 68 year old male with stage IV non-small cell lung cancer who presents for evaluation and discussion of palliative treatment options.  He was diagnosed with stage IV non-small cell lung cancer (squamous cell carcinoma) in November 2025, with involvement of the right lower lobe, subcarinal lymphadenopathy, and hepatic metastases confirmed by liver biopsy.  He describes chronic pain localized to the left backside, persistent over a prolonged period without acute exacerbation. He experiences ongoing dyspnea and intermittent dyspepsia, but denies chest pain. His sister corroborates his respiratory symptoms. He previously received radiation therapy to the back one to two years ago, which was complicated by significant confusion at that time.  He currently takes a mineral supplement and multivitamins. His sister inquired about the use of herbal supplements and spirulina during chemotherapy.     MEDICAL HISTORY: Past Medical History:  Diagnosis Date    Abdominal pain 06/03/2013   Chest pain 08/10/2023   COPD (chronic obstructive pulmonary disease) (HCC)    with bullous emphysema.    COPD exacerbation (HCC) 08/09/2023   Heavy cigarette smoker before 2003   pt claims only 10 cigs per day, never heavier amounts.    HLD (hyperlipidemia)    Hypertension    Schizophrenia (HCC) 06/28/2013   This is a chronic condition and he lives with family    ALLERGIES:  has no known allergies.  MEDICATIONS:  Current Outpatient Medications  Medication Sig Dispense Refill   acetaminophen  (TYLENOL ) 500 MG tablet Take 2 tablets (1,000 mg total) by mouth every 8 (eight) hours. 30 tablet 0   albuterol  (PROVENTIL ) (2.5 MG/3ML) 0.083% nebulizer solution Take 2.5 mg by nebulization every 6 (six) hours as needed.     albuterol  (VENTOLIN  HFA) 108 (90 Base) MCG/ACT inhaler Inhale 2 puffs into the lungs every 4 (four) hours as needed for wheezing or shortness of breath. 1 each 1   apixaban  (ELIQUIS ) 5 MG TABS tablet Take 1 tablet (5 mg total) by mouth 2 (two) times daily.     Artificial Tear Ointment (DRY EYES OP) Place 1 drop into both eyes 2 (two) times daily as needed (itichy eyes).     atorvastatin  (LIPITOR) 10 MG tablet Take 1 tablet (10 mg total) by mouth at bedtime. 30 tablet 1   benztropine  (COGENTIN ) 1 MG tablet Take 1 tablet (1 mg total) by mouth 2 (two) times daily. 180 tablet 0   carvedilol  (COREG ) 3.125 MG tablet Take 2 tablets (6.25 mg total) by mouth 2 (two) times daily with a meal.     cetirizine (ZYRTEC) 10 MG tablet Take 10 mg by mouth  daily as needed for allergies or rhinitis.     cholecalciferol  (VITAMIN D ) 25 MCG (1000 UNIT) tablet Take 2,000-4,000 Units by mouth every morning.     cyanocobalamin  1000 MCG tablet Take 1,000 mcg by mouth daily. (Patient taking differently: Take 1,000 mcg by mouth as needed.)     famotidine  (PEPCID ) 40 MG tablet Take 40 mg by mouth 2 (two) times daily.     feeding supplement, GLUCERNA SHAKE, (GLUCERNA SHAKE) LIQD  Take 237 mLs by mouth 3 (three) times daily between meals. (Patient not taking: Reported on 02/16/2024)     Fluticasone -Umeclidin-Vilant (TRELEGY ELLIPTA ) 100-62.5-25 MCG/ACT AEPB Inhale 1 puff into the lungs daily.     guaiFENesin  (MUCINEX ) 600 MG 12 hr tablet Take 1 tablet (600 mg total) by mouth 2 (two) times daily as needed. 30 tablet 0   hydrOXYzine  (ATARAX ) 25 MG tablet Take 1 tablet (25 mg total) by mouth 3 (three) times daily as needed for anxiety. 30 tablet 0   ipratropium-albuterol  (DUONEB) 0.5-2.5 (3) MG/3ML SOLN Take 3 mLs by nebulization every 4 (four) hours. (Patient taking differently: Take 3 mLs by nebulization every 4 (four) hours as needed.)     JANUVIA 100 MG tablet Take 100 mg by mouth every evening.     lidocaine  (LIDODERM ) 5 % Place 1 patch onto the skin daily. Remove & Discard patch within 12 hours or as directed by MD (Patient taking differently: Place 1 patch onto the skin daily as needed (pain). Remove & Discard patch within 12 hours or as directed by MD) 30 patch 0   methocarbamol  (ROBAXIN ) 750 MG tablet Take 1 tablet (750 mg total) by mouth every 8 (eight) hours as needed for muscle spasms. 30 tablet 0   mirtazapine  (REMERON ) 30 MG tablet Take 1 tablet (30 mg total) by mouth at bedtime as needed (insomnia). 90 tablet 0   multivitamin (ONE-A-DAY MEN'S) TABS tablet Take 1 tablet by mouth daily.     OXYGEN  Inhale 2 L/min into the lungs continuous.      polyethylene glycol (MIRALAX  / GLYCOLAX ) 17 g packet Take 17 g by mouth daily. 14 each 0   predniSONE  (DELTASONE ) 20 MG tablet Take 40 mg by mouth daily for 3 days, then 30 mg by mouth daily for 3 days, then go back on your usual maintenance regimen of prednisone  20 mg by mouth daily. (Patient taking differently: Take 20 mg by mouth daily with breakfast. For congestion) 30 tablet 1   risperiDONE  (RISPERDAL ) 2 MG tablet Take 1 tablet (2 mg total) by mouth 2 (two) times daily. 180 tablet 0   sodium chloride  (OCEAN) 0.65 % SOLN nasal  spray Place 1 spray into both nostrils as needed for congestion.     tamsulosin  (FLOMAX ) 0.4 MG CAPS capsule Take 0.4 mg by mouth in the morning and at bedtime.     traMADol  (ULTRAM ) 50 MG tablet Take 1 tablet (50 mg total) by mouth every 6 (six) hours as needed. 40 tablet 0   No current facility-administered medications for this visit.    SURGICAL HISTORY:  Past Surgical History:  Procedure Laterality Date   CHOLECYSTECTOMY N/A 06/06/2013   Procedure: LAPAROSCOPIC CHOLECYSTECTOMY WITH ATTEMPTED INTRAOPERATIVE CHOLANGIOGRAM;  Surgeon: Dann FORBES Hummer, MD;  Location: [facility] OR;  Service: General;  Laterality: N/A;   ERCP N/A 06/03/2013   Procedure: ENDOSCOPIC RETROGRADE CHOLANGIOPANCREATOGRAPHY (ERCP);  Surgeon: Gwendlyn ONEIDA Buddy, MD;  Location: [facility] ENDOSCOPY;  Service: Endoscopy;  Laterality: N/A;    REVIEW OF SYSTEMS:  Constitutional:  positive for fatigue Eyes: negative Ears, nose, mouth, throat, and face: negative Respiratory: positive for cough and dyspnea on exertion Cardiovascular: negative Gastrointestinal: negative Genitourinary:negative Integument/breast: negative Hematologic/lymphatic: negative Musculoskeletal:positive for muscle weakness Neurological: positive for memory problems and weakness Behavioral/Psych: positive for fatigue and loss of interest in favorite activities Endocrine: negative Allergic/Immunologic: negative   PHYSICAL EXAMINATION: General appearance: alert, cooperative, fatigued, and no distress Head: Normocephalic, without obvious abnormality, atraumatic Neck: no adenopathy, no JVD, supple, symmetrical, trachea midline, and thyroid not enlarged, symmetric, no tenderness/mass/nodules Lymph nodes: Cervical, supraclavicular, and axillary nodes normal. Resp: clear to auscultation bilaterally Back: symmetric, no curvature. ROM normal. No CVA tenderness. Cardio: regular rate and rhythm, S1, S2 normal, no murmur, click, rub or gallop GI: soft, non-tender; bowel  sounds normal; no masses,  no organomegaly Extremities: extremities normal, atraumatic, no cyanosis or edema Neurologic: Alert and oriented X 3, normal strength and tone. Normal symmetric reflexes. Normal coordination and gait  ECOG PERFORMANCE STATUS: 1 - Symptomatic but completely ambulatory  Blood pressure 126/74, pulse 88, temperature 97.8 F (36.6 C), temperature source Temporal, resp. rate 17, height 5' 6 (1.676 m), weight 136 lb (61.7 kg), SpO2 94%.  LABORATORY DATA: Lab Results  Component Value Date   WBC 11.2 (H) 02/25/2024   HGB 13.0 02/25/2024   HCT 39.6 02/25/2024   MCV 80.3 02/25/2024   PLT 317 02/25/2024      Chemistry      Component Value Date/Time   NA 132 (L) 02/07/2024 1015   K 4.2 02/07/2024 1015   CL 99 02/07/2024 1015   CO2 21 (L) 02/07/2024 1015   BUN 10 02/07/2024 1015   CREATININE 0.66 02/07/2024 1015   CREATININE 0.75 10/21/2022 1320      Component Value Date/Time   CALCIUM  8.5 (L) 02/07/2024 1015   ALKPHOS 70 02/06/2024 2015   AST 16 02/06/2024 2015   AST 10 (L) 10/21/2022 1320   ALT 13 02/06/2024 2015   ALT 19 10/21/2022 1320   BILITOT 1.0 02/06/2024 2015   BILITOT 0.3 10/21/2022 1320       RADIOGRAPHIC STUDIES: CT Angio Chest PE W and/or Wo Contrast Result Date: 02/06/2024 CLINICAL DATA:  Shortness of breath, known metastatic lung cancer EXAM: CT ANGIOGRAPHY CHEST WITH CONTRAST TECHNIQUE: Multidetector CT imaging of the chest was performed using the standard protocol during bolus administration of intravenous contrast. Multiplanar CT image reconstructions and MIPs were obtained to evaluate the vascular anatomy. RADIATION DOSE REDUCTION: This exam was performed according to the departmental dose-optimization program which includes automated exposure control, adjustment of the mA and/or kV according to patient size and/or use of iterative reconstruction technique. CONTRAST:  75mL OMNIPAQUE  IOHEXOL  350 MG/ML SOLN COMPARISON:  12/08/2023,  09/30/2023 FINDINGS: Cardiovascular: This is a technically adequate evaluation of the pulmonary vasculature. Stable chronic thrombus within the right lower lobe pulmonary artery. No acute filling defects or evidence of acute pulmonary emboli. The heart is unremarkable without pericardial effusion. No evidence of thoracic aortic aneurysm or dissection. Atherosclerosis of the aorta and coronary vasculature. Mediastinum/Nodes: Progressive subcarinal lymphadenopathy measuring 4.3 x 2.9 cm reference image 92/5. Thyroid, trachea, and esophagus are stable. Lungs/Pleura: Severe emphysematous changes are again noted. Peripheral masslike consolidation within the right lower lobe again noted, measuring 3.6 x 5.3 cm. Trace right pleural effusion. No pneumothorax. Upper Abdomen: Partial visualization of the metastatic lesion within the inferior right lobe liver noted on prior PET scan, incompletely assessed due to slice selection. No acute upper abdominal findings. Musculoskeletal: There is a  chronic T7 compression deformity and resulting vertebra plana. Since the prior exams, compression deformities have developed at T6 and T8 with approximately 50% loss of height. Pathologic fractures cannot be excluded. Reconstructed images demonstrate no additional findings. Review of the MIP images confirms the above findings. IMPRESSION: 1. No evidence of acute pulmonary embolus. 2. Stable chronic thrombus within the right lower lobe pulmonary artery. 3. Persistent masslike consolidation right lower lobe compatible with known history of lung cancer. 4. Progressive subcarinal lymphadenopathy consistent with metastatic disease. 5. Chronic T7 compression deformity, with interval development of T6 and T8 anterior wedge compression deformities. Pathologic fractures cannot be excluded. 6. Partial visualization of the metastatic liver lesion, incompletely visualized due to slice selection. 7. Aortic Atherosclerosis (ICD10-I70.0) and Emphysema  (ICD10-J43.9). Electronically Signed   By: Ozell Daring M.D.   On: 02/06/2024 22:52   US  BIOPSY (LIVER) Result Date: 02/04/2024 INDICATION: Right lower lobe lung mass, nodal metastatic disease and right liver mass likely representing metastatic disease. The patient presents for liver mass biopsy. EXAM: ULTRASOUND GUIDED CORE BIOPSY OF LIVER MEDICATIONS: None. ANESTHESIA/SEDATION: Moderate (conscious) sedation was employed during this procedure. A total of Versed  0.5 mg and Fentanyl  25 mcg was administered intravenously. Moderate Sedation Time: 15 minutes. The patient's level of consciousness and vital signs were monitored continuously by radiology nursing throughout the procedure under my direct supervision. PROCEDURE: The procedure, risks, benefits, and alternatives were explained to the patient. Questions regarding the procedure were encouraged and answered. The patient understands and consents to the procedure. A time out was performed prior to initiating the procedure. The abdominal wall was prepped with chlorhexidine  in a sterile fashion, and a sterile drape was applied covering the operative field. A sterile gown and sterile gloves were used for the procedure. Local anesthesia was provided with 1% Lidocaine . Ultrasound was used to localize a mass within the right lobe of the liver. Under ultrasound guidance, a 17 gauge trocar needle was advanced to the level of the lesion. Three separate coaxial 18 gauge core biopsy samples were obtained through the mass. Core biopsy samples were submitted in formalin. A slurry of Gel-Foam EmboCubes mixed in saline was then injected via the outer needle as the needle was retracted and removed. Additional ultrasound was performed. COMPLICATIONS: None immediate. FINDINGS: Ultrasound demonstrates a hypoechoic mass within the inferior aspect of the right lobe of the liver measuring roughly 4.7 x 2.3 x 3.6 cm. Solid tissue was obtained from the lesion. IMPRESSION:  Ultrasound-guided core biopsy performed of a mass within the inferior right lobe of the liver measuring up to 4.7 cm by ultrasound. Electronically Signed   By: Marcey Moan M.D.   On: 02/04/2024 16:48    ASSESSMENT AND PLAN: Stage IV (T2b, N2, M1 C) non-small cell lung cancer, squamous cell carcinoma presented with right lower lobe lung mass in addition to subcarinal lymphadenopathy as well as hepatic metastasis diagnosed in November 2025.  The patient and his sister understand that he has incurable condition and all the treatment will be of palliative nature with the goal of prolongation of his life and palliation of his symptoms. Assessment and Plan Assessment & Plan Stage IV non-small cell lung cancer with hepatic and lymph node metastases He has advanced, metastatic, incurable squamous cell carcinoma confirmed by liver biopsy in November 2025, with metastases to subcarinal lymph nodes and liver. He experiences chronic pain and mild dyspnea, without acute decompensation. Prior radiation therapy resulted in significant confusion. Prognosis without treatment is three to six months; palliative  systemic therapy may extend survival up to two years and improve quality of life. He and his family, including his sister and brother, are involved in treatment decision-making. - Discussed treatment options: combination chemotherapy and immunotherapy versus hospice care. - Recommended four cycles of combination chemotherapy and immunotherapy every three weeks for twelve weeks, followed by maintenance immunotherapy every three weeks for up to two years if tolerated and effective. - Reviewed risks of therapy: alopecia, nausea, vomiting, cytopenias, neuropathy, fatigue, and potential cognitive effects. - Provided anticipatory guidance regarding side effects and emphasized that therapy is not curative and may be discontinued if intolerable toxicity or functional decline occurs. - Advised avoidance of herbal  supplements during chemotherapy due to potential interactions; mineral pills and multivitamins permitted. - Offered referral to dietitian for nutritional and supplement counseling. - Planned antiemetic therapy for nausea management if treatment initiated. - Advised to communicate treatment decision promptly due to holiday scheduling constraints. - Documented treatment plan and schedule in after visit summary. The patient was advised to call immediately if he has any concerning symptoms in the interval. The patient voices understanding of current disease status and treatment options and is in agreement with the current care plan.  All questions were answered. The patient knows to call the clinic with any problems, questions or concerns. We can certainly see the patient much sooner if necessary. The total time spent in the appointment was 55 minutes including review of chart and various tests results, discussions about plan of care and coordination of care plan .   Disclaimer: This note was dictated with voice recognition software. Similar sounding words can inadvertently be transcribed and may not be corrected upon review.

## 2024-03-04 ENCOUNTER — Inpatient Hospital Stay: Admitting: General Practice

## 2024-03-04 NOTE — Progress Notes (Signed)
 [facility] Advance Directives Spiritual Care  Patient presented to Advance Directives Clinic to review and complete healthcare advance directives.  Clinical Chaplain met with patient and his sister Blake Mcknight. At the time of this encounter, [name] was unable to state his wishes clearly, so forms remain incomplete.   Chaplain encouraged patient/family to contact [facility] patient and family support center if needs change or additional questions arise.   [address] [place], [place], [facility] Pager [phone] Voicemail [phone]

## 2024-03-21 ENCOUNTER — Encounter: Payer: Self-pay | Admitting: Internal Medicine

## 2024-03-21 ENCOUNTER — Telehealth: Payer: Self-pay | Admitting: Physician Assistant

## 2024-03-21 ENCOUNTER — Telehealth: Payer: Self-pay

## 2024-03-21 ENCOUNTER — Other Ambulatory Visit: Payer: Self-pay | Admitting: Physician Assistant

## 2024-03-21 DIAGNOSIS — C3431 Malignant neoplasm of lower lobe, right bronchus or lung: Secondary | ICD-10-CM

## 2024-03-21 MED ORDER — ONDANSETRON HCL 8 MG PO TABS: 8.0000 mg | ORAL_TABLET | Freq: Three times a day (TID) | ORAL | 1 refills | Status: AC | PRN

## 2024-03-21 MED ORDER — LIDOCAINE-PRILOCAINE 2.5-2.5 % EX CREA: TOPICAL_CREAM | CUTANEOUS | 3 refills | Status: AC

## 2024-03-21 MED ORDER — PROCHLORPERAZINE MALEATE 10 MG PO TABS: 10.0000 mg | ORAL_TABLET | Freq: Four times a day (QID) | ORAL | 1 refills | Status: DC | PRN

## 2024-03-21 NOTE — Telephone Encounter (Signed)
 Spoke with [name] in regards to port placement appt.  Informed her the next availability is on Tuesday, 03/29/24 @ [facility] with an arrival at 1130 AM.  Informed her NPO after midnight and liquids until 7 AM.  [name] inquired about Coreg  the morning of port a cath placement.  Informed her that he can take it the morning of.  She voiced understanding.

## 2024-03-21 NOTE — Telephone Encounter (Signed)
 Pt sister answered. And is aware of the edu appt

## 2024-03-21 NOTE — Telephone Encounter (Signed)
 Spoke with patients sister, [name], this morning regarding treatment plans. [name] reports the patient would like to proceed with treatment and is requesting placement of a Port-a-Cath. Order placed for Port-a-Cath insertion. [name] also requested that future appointments be scheduled after 10:30 AM to better accommodate their schedule. Informed [name] that this information would be relayed to Dr. Sherrod and the scheduling team, and that someone from the office will contact them with updated appointment times and Port-a-Cath placement details. [name] verbalized understanding.

## 2024-03-21 NOTE — Progress Notes (Signed)
 START OFF PATHWAY REGIMEN - Non-Small Cell Lung   [id number]:Cemiplimab 350 mg IV D1 + Carboplatin  AUC=6 IV D1 + Paclitaxel 200 mg/m2 IV D1 q21 Days x 4 Cycles:   A cycle is every 21 days:     Paclitaxel      Carboplatin       Cemiplimab-rwlc   **Always confirm dose/schedule in your pharmacy ordering system**  Clinician Citation:   Patient Characteristics: Stage IV Metastatic, Squamous, Molecular Analysis Not Elected, PS = 0, 1, Initial Chemotherapy/Immunotherapy, Immunotherapy Candidate, PD-L1 Expression Positive 1-49% (TPS) / Negative / Not Tested / Awaiting Test Results Therapeutic Status: Stage IV Metastatic Histology: Squamous Cell Chemotherapy/Immunotherapy Line of Therapy: Initial Chemotherapy/Immunotherapy ECOG Performance Status: 1 Immunotherapy Candidate Status: Candidate for Immunotherapy PD-L1 Expression Status: Awaiting Test Results Intent of Therapy: Non-Curative / Palliative Intent, Discussed with Patient

## 2024-03-22 ENCOUNTER — Other Ambulatory Visit: Payer: Self-pay

## 2024-03-28 ENCOUNTER — Other Ambulatory Visit: Payer: Self-pay | Admitting: Radiology

## 2024-03-28 NOTE — H&P (Incomplete)
 "  Chief Complaint: Stage IV (T2b, N2, M1 C) non-small cell lung cancer, squamous cell carcinoma presented with right lower lobe lung mass in addition to subcarinal lymphadenopathy as well as hepatic metastasis diagnosed in November 2025 ; referred for port a cath placement to assist with treatmenti  Referring Provider(s): Mohamed,M  Supervising Physician: Jennefer Rover  Patient Status: [facility] - Out-pt  History of Present Illness: Blake Mcknight is a 69 y.o. male ex smoker with PMH sig for COPD, HLD, HTN, schizophrenia who presents now with stage IV (T2b, N2, M1 C) non-small cell lung cancer/ squamous cell carcinoma with right lower lobe lung mass in addition to subcarinal lymphadenopathy as well as hepatic metastasis diagnosed in November 2025 . He is scheduled today for port a cath placement to assist with treatment.   *** Patient is Full Code  Past Medical History:  Diagnosis Date   Abdominal pain 06/03/2013   Chest pain 08/10/2023   COPD (chronic obstructive pulmonary disease) (HCC)    with bullous emphysema.    COPD exacerbation (HCC) 08/09/2023   Heavy cigarette smoker before 2003   pt claims only 10 cigs per day, never heavier amounts.    HLD (hyperlipidemia)    Hypertension    Schizophrenia (HCC) 06/28/2013   This is a chronic condition and he lives with family    Past Surgical History:  Procedure Laterality Date   CHOLECYSTECTOMY N/A 06/06/2013   Procedure: LAPAROSCOPIC CHOLECYSTECTOMY WITH ATTEMPTED INTRAOPERATIVE CHOLANGIOGRAM;  Surgeon: Dann FORBES Hummer, MD;  Location: [facility] OR;  Service: General;  Laterality: N/A;   ERCP N/A 06/03/2013   Procedure: ENDOSCOPIC RETROGRADE CHOLANGIOPANCREATOGRAPHY (ERCP);  Surgeon: Gwendlyn ONEIDA Buddy, MD;  Location: [facility] ENDOSCOPY;  Service: Endoscopy;  Laterality: N/A;    Allergies: Patient has no known allergies.  Medications: Prior to Admission medications  Medication Sig Start Date End Date Taking? Authorizing Provider   acetaminophen  (TYLENOL ) 500 MG tablet Take 2 tablets (1,000 mg total) by mouth every 8 (eight) hours. 08/14/23   Fausto Sor A, DO  albuterol  (PROVENTIL ) (2.5 MG/3ML) 0.083% nebulizer solution Take 2.5 mg by nebulization every 6 (six) hours as needed. 01/06/24   [provider]  albuterol  (VENTOLIN  HFA) 108 (90 Base) MCG/ACT inhaler Inhale 2 puffs into the lungs every 4 (four) hours as needed for wheezing or shortness of breath. 10/02/22   Bernard Drivers, MD  apixaban  (ELIQUIS ) 5 MG TABS tablet Take 1 tablet (5 mg total) by mouth 2 (two) times daily. 10/22/23   Shelah Lamar RAMAN, MD  Artificial Tear Ointment (DRY EYES OP) Place 1 drop into both eyes 2 (two) times daily as needed (itichy eyes).    [provider]  atorvastatin  (LIPITOR) 10 MG tablet Take 1 tablet (10 mg total) by mouth at bedtime. 08/14/23   Fausto Sor A, DO  benztropine  (COGENTIN ) 1 MG tablet Take 1 tablet (1 mg total) by mouth 2 (two) times daily. 02/03/24   Arfeen, Leni ONEIDA, MD  carvedilol  (COREG ) 3.125 MG tablet Take 2 tablets (6.25 mg total) by mouth 2 (two) times daily with a meal. 09/10/23   Will Almarie MATSU, MD  cetirizine (ZYRTEC) 10 MG tablet Take 10 mg by mouth daily as needed for allergies or rhinitis.    [provider]  cholecalciferol  (VITAMIN D ) 25 MCG (1000 UNIT) tablet Take 2,000-4,000 Units by mouth every morning.    [provider]  cyanocobalamin  1000 MCG tablet Take 1,000 mcg by mouth daily. Patient taking differently: Take 1,000 mcg by mouth  as needed.    [provider]  famotidine  (PEPCID ) 40 MG tablet Take 40 mg by mouth 2 (two) times daily. 12/28/18   [provider]  feeding supplement, GLUCERNA SHAKE, (GLUCERNA SHAKE) LIQD Take 237 mLs by mouth 3 (three) times daily between meals. Patient not taking: Reported on 02/16/2024 08/14/23   Fausto Sor A, DO  Fluticasone -Umeclidin-Vilant (TRELEGY ELLIPTA ) 100-62.5-25 MCG/ACT AEPB Inhale 1 puff into the  lungs daily. 01/01/24   Parrett, Madelin RAMAN, NP  guaiFENesin  (MUCINEX ) 600 MG 12 hr tablet Take 1 tablet (600 mg total) by mouth 2 (two) times daily as needed. 02/07/24   Amin, Sumayya, MD  hydrOXYzine  (ATARAX ) 25 MG tablet Take 1 tablet (25 mg total) by mouth 3 (three) times daily as needed for anxiety. 10/12/23   Sherrill Cable Latif, DO  ipratropium-albuterol  (DUONEB) 0.5-2.5 (3) MG/3ML SOLN Take 3 mLs by nebulization every 4 (four) hours. Patient taking differently: Take 3 mLs by nebulization every 4 (four) hours as needed. 08/14/23   Fausto Sor A, DO  JANUVIA 100 MG tablet Take 100 mg by mouth every evening. 01/30/23   [provider]  lidocaine  (LIDODERM ) 5 % Place 1 patch onto the skin daily. Remove & Discard patch within 12 hours or as directed by MD Patient taking differently: Place 1 patch onto the skin daily as needed (pain). Remove & Discard patch within 12 hours or as directed by MD 09/10/23   Will Almarie MATSU, MD  lidocaine -prilocaine  (EMLA ) cream Apply to affected area once 03/21/24   Sherrod Sherrod, MD  methocarbamol  (ROBAXIN ) 750 MG tablet Take 1 tablet (750 mg total) by mouth every 8 (eight) hours as needed for muscle spasms. 09/10/23   Will Almarie MATSU, MD  mirtazapine  (REMERON ) 30 MG tablet Take 1 tablet (30 mg total) by mouth at bedtime as needed (insomnia). 02/03/24 02/02/25  Arfeen, Leni DASEN, MD  multivitamin (ONE-A-DAY MEN'S) TABS tablet Take 1 tablet by mouth daily.    [provider]  ondansetron  (ZOFRAN ) 8 MG tablet Take 1 tablet (8 mg total) by mouth every 8 (eight) hours as needed for nausea or vomiting. Start on the third day after carboplatin. 03/21/24   Sherrod Sherrod, MD  OXYGEN  Inhale 2 L/min into the lungs continuous.     [provider]  polyethylene glycol (MIRALAX  / GLYCOLAX ) 17 g packet Take 17 g by mouth daily. 09/10/23   Will Almarie MATSU, MD  predniSONE  (DELTASONE ) 20 MG tablet Take 40 mg by mouth daily for 3 days, then 30 mg by  mouth daily for 3 days, then go back on your usual maintenance regimen of prednisone  20 mg by mouth daily. Patient taking differently: Take 20 mg by mouth daily with breakfast. For congestion 11/29/23   Ghimire, Donalda HERO, MD  prochlorperazine  (COMPAZINE ) 10 MG tablet Take 1 tablet (10 mg total) by mouth every 6 (six) hours as needed for nausea or vomiting. 03/21/24   Sherrod Sherrod, MD  risperiDONE  (RISPERDAL ) 2 MG tablet Take 1 tablet (2 mg total) by mouth 2 (two) times daily. 02/03/24   Arfeen, Leni DASEN, MD  sodium chloride  (OCEAN) 0.65 % SOLN nasal spray Place 1 spray into both nostrils as needed for congestion.    [provider]  tamsulosin  (FLOMAX ) 0.4 MG CAPS capsule Take 0.4 mg by mouth in the morning and at bedtime. 07/23/20   [provider]  traMADol  (ULTRAM ) 50 MG tablet Take 1 tablet (50 mg total) by mouth every 6 (six) hours as needed. 10/21/23  Shelah Lamar RAMAN, MD     Family History  Problem Relation Age of Onset   Diabetes Mellitus II Mother    Diabetes Mother    Heart disease Father    Stroke Maternal Aunt    CAD Neg Hx     Social History   Socioeconomic History   Marital status: Single    Spouse name: Not on file   Number of children: Not on file   Years of education: Not on file   Highest education level: Not on file  Occupational History   Occupation: disabled  Tobacco Use   Smoking status: Former    Current packs/day: 1.00    Average packs/day: 1 pack/day for 41.0 years (41.0 ttl pk-yrs)    Types: Cigarettes   Smokeless tobacco: Never   Tobacco comments:    Quit smoking 2023  Vaping Use   Vaping status: Never Used  Substance and Sexual Activity   Alcohol use: No   Drug use: No   Sexual activity: Yes  Other Topics Concern   Not on file  Social History Narrative   Not on file   Social Drivers of Health   Tobacco Use: Medium Risk (02/16/2024)   Patient History    Smoking Tobacco Use: Former    Smokeless Tobacco Use: Never    Passive  Exposure: Not on Actuary Strain: Not on file  Food Insecurity: Patient Unable To Answer (11/27/2023)   Epic    Worried About Programme Researcher, Broadcasting/film/video in the Last Year: Patient unable to answer    Ran Out of Food in the Last Year: Patient unable to answer  Transportation Needs: Patient Unable To Answer (11/28/2023)   Epic    Lack of Transportation (Medical): Patient unable to answer    Lack of Transportation (Non-Medical): Patient unable to answer  Physical Activity: Not on file  Stress: Not on file  Social Connections: Unknown (11/27/2023)   Social Connection and Isolation Panel    Frequency of Communication with Friends and Family: Patient unable to answer    Frequency of Social Gatherings with Friends and Family: Patient unable to answer    Attends Religious Services: Patient unable to answer    Active Member of Clubs or Organizations: Patient unable to answer    Attends Banker Meetings: Not on file    Marital Status: Not on file  Recent Concern: Social Connections - Socially Isolated (10/27/2023)   Social Connection and Isolation Panel    Frequency of Communication with Friends and Family: Never    Frequency of Social Gatherings with Friends and Family: Never    Attends Religious Services: Never    Database Administrator or Organizations: No    Attends Banker Meetings: Never    Marital Status: Never married  Depression (PHQ2-9): Not on file  Alcohol Screen: Not on file  Housing: Patient Unable To Answer (11/27/2023)   Epic    Unable to Pay for Housing in the Last Year: Patient unable to answer    Number of Times Moved in the Last Year: Not on file    Homeless in the Last Year: Patient unable to answer  Utilities: Patient Unable To Answer (11/28/2023)   Epic    Threatened with loss of utilities: Patient unable to answer  Health Literacy: Not on file       Review of Systems  Vital Signs:   Advance Care Plan: no documents on  file  Physical Exam  Imaging: No results found.  Labs:  CBC: Recent Labs    02/04/24 1447 02/06/24 2015 02/06/24 2123 02/07/24 1015 02/25/24 0843  WBC 9.9 15.3*  --  8.2 11.2*  HGB 12.4* 12.9* 13.9 12.5* 13.0  HCT 38.6* 40.8 41.0 38.2* 39.6  PLT 300 291  --  283 317    COAGS: Recent Labs    10/08/23 1431 10/09/23 0351 10/27/23 0137 02/04/24 1447  INR  --   --  1.3* 1.0  APTT 91* 42*  --   --     BMP: Recent Labs    11/28/23 0418 01/01/24 1254 02/06/24 2015 02/06/24 2123 02/07/24 1015 02/25/24 0843  NA 136 135 135 135 132* 137  K 4.5 4.5 4.4 4.6 4.2 4.2  CL 102 98 100  --  99 99  CO2 22 27 17*  --  21* 28  GLUCOSE 170* 113* 97  --  172* 96  BUN 7* 8 9  --  10 6*  CALCIUM  8.8* 9.4 8.5*  --  8.5* 9.6  CREATININE 0.61 0.69 0.75  --  0.66 0.76  GFRNONAA >60  --  >60  --  >60 >60    LIVER FUNCTION TESTS: Recent Labs    10/12/23 0350 11/27/23 0155 02/06/24 2015 02/25/24 0843  BILITOT 0.3 0.8 1.0 0.5  AST 18 20 16  13*  ALT 24 12 13 12   ALKPHOS 60 81 70 82  PROT 6.1* 6.9 6.7 7.5  ALBUMIN 2.4* 2.9* 2.8* 3.9    TUMOR MARKERS: No results for input(s): AFPTM, CEA, CA199, CHROMGRNA in the last 8760 hours.  Assessment and Plan: 69 y.o. male ex smoker with PMH sig for COPD, HLD, HTN, schizophrenia who presents now with stage IV (T2b, N2, M1 C) non-small cell lung cancer/ squamous cell carcinoma with right lower lobe lung mass in addition to subcarinal lymphadenopathy as well as hepatic metastasis diagnosed in November 2025 . He is scheduled today for port a cath placement to assist with treatment.Risks and benefits of image guided port-a-catheter placement was discussed with the patient including, but not limited to bleeding, infection, pneumothorax, or fibrin sheath development and need for additional procedures.  All of the patient's questions were answered, patient is agreeable to proceed. Consent signed and in chart.    Thank you for  allowing our service to participate in Blake Mcknight 's care.  Electronically Signed: D. Franky Rakers, PA-C   03/28/2024, 9:10 AM      I spent a total of 20 minutes    in face to face in clinical consultation, greater than 50% of which was counseling/coordinating care for port a cath placement   "

## 2024-03-29 ENCOUNTER — Other Ambulatory Visit (HOSPITAL_COMMUNITY)

## 2024-03-29 ENCOUNTER — Ambulatory Visit (HOSPITAL_COMMUNITY)

## 2024-03-29 DIAGNOSIS — Z01818 Encounter for other preprocedural examination: Secondary | ICD-10-CM

## 2024-03-30 ENCOUNTER — Other Ambulatory Visit (HOSPITAL_COMMUNITY): Payer: Self-pay | Admitting: Radiology

## 2024-03-30 NOTE — Progress Notes (Signed)
 Pharmacist Chemotherapy Monitoring - Initial Assessment    Anticipated start date: 04/04/2024   The following has been reviewed per standard work regarding the patient's treatment regimen: The patient's diagnosis, treatment plan and drug doses, and organ/hematologic function Lab orders and baseline tests specific to treatment regimen  The treatment plan start date, drug sequencing, and pre-medications Prior authorization status  Patient's documented medication list, including drug-drug interaction screen and prescriptions for anti-emetics and supportive care specific to the treatment regimen The drug concentrations, fluid compatibility, administration routes, and timing of the medications to be used The patient's access for treatment and lifetime cumulative dose history, if applicable  The patient's medication allergies and previous infusion related reactions, if applicable   Changes made to treatment plan:  N/A  Follow up needed:  N/A   Marlee Eleanor Neighbors, RPH, 03/30/2024  8:16 AM

## 2024-03-31 ENCOUNTER — Encounter (HOSPITAL_COMMUNITY): Payer: Self-pay

## 2024-03-31 ENCOUNTER — Other Ambulatory Visit: Payer: Self-pay

## 2024-03-31 ENCOUNTER — Ambulatory Visit (HOSPITAL_COMMUNITY)
Admission: RE | Admit: 2024-03-31 | Discharge: 2024-03-31 | Disposition: A | Discharge status: Home / Self Care | Source: Ambulatory Visit | Attending: Internal Medicine | Admitting: Internal Medicine

## 2024-03-31 ENCOUNTER — Inpatient Hospital Stay (HOSPITAL_COMMUNITY)
Admission: RE | Admit: 2024-03-31 | Discharge: 2024-03-31 | Payer: Self-pay | Attending: Internal Medicine | Admitting: Internal Medicine

## 2024-03-31 ENCOUNTER — Inpatient Hospital Stay: Attending: Internal Medicine

## 2024-03-31 DIAGNOSIS — Z7901 Long term (current) use of anticoagulants: Secondary | ICD-10-CM | POA: Insufficient documentation

## 2024-03-31 DIAGNOSIS — Z5112 Encounter for antineoplastic immunotherapy: Secondary | ICD-10-CM | POA: Insufficient documentation

## 2024-03-31 DIAGNOSIS — Z79899 Other long term (current) drug therapy: Secondary | ICD-10-CM | POA: Insufficient documentation

## 2024-03-31 DIAGNOSIS — Z7952 Long term (current) use of systemic steroids: Secondary | ICD-10-CM | POA: Insufficient documentation

## 2024-03-31 DIAGNOSIS — D491 Neoplasm of unspecified behavior of respiratory system: Secondary | ICD-10-CM | POA: Insufficient documentation

## 2024-03-31 DIAGNOSIS — C3490 Malignant neoplasm of unspecified part of unspecified bronchus or lung: Secondary | ICD-10-CM | POA: Insufficient documentation

## 2024-03-31 DIAGNOSIS — Z7984 Long term (current) use of oral hypoglycemic drugs: Secondary | ICD-10-CM | POA: Insufficient documentation

## 2024-03-31 DIAGNOSIS — Z79620 Long term (current) use of immunosuppressive biologic: Secondary | ICD-10-CM | POA: Insufficient documentation

## 2024-03-31 DIAGNOSIS — Z87891 Personal history of nicotine dependence: Secondary | ICD-10-CM | POA: Insufficient documentation

## 2024-03-31 DIAGNOSIS — Z5189 Encounter for other specified aftercare: Secondary | ICD-10-CM | POA: Insufficient documentation

## 2024-03-31 DIAGNOSIS — Z01818 Encounter for other preprocedural examination: Secondary | ICD-10-CM

## 2024-03-31 DIAGNOSIS — Z9049 Acquired absence of other specified parts of digestive tract: Secondary | ICD-10-CM | POA: Insufficient documentation

## 2024-03-31 DIAGNOSIS — C3431 Malignant neoplasm of lower lobe, right bronchus or lung: Secondary | ICD-10-CM | POA: Insufficient documentation

## 2024-03-31 DIAGNOSIS — Z85118 Personal history of other malignant neoplasm of bronchus and lung: Secondary | ICD-10-CM | POA: Insufficient documentation

## 2024-03-31 DIAGNOSIS — Z79633 Long term (current) use of mitotic inhibitor: Secondary | ICD-10-CM | POA: Insufficient documentation

## 2024-03-31 DIAGNOSIS — Z452 Encounter for adjustment and management of vascular access device: Secondary | ICD-10-CM | POA: Insufficient documentation

## 2024-03-31 DIAGNOSIS — C787 Secondary malignant neoplasm of liver and intrahepatic bile duct: Secondary | ICD-10-CM | POA: Insufficient documentation

## 2024-03-31 DIAGNOSIS — Z5111 Encounter for antineoplastic chemotherapy: Secondary | ICD-10-CM | POA: Insufficient documentation

## 2024-03-31 DIAGNOSIS — Z79630 Long term (current) use of alkylating agent: Secondary | ICD-10-CM | POA: Insufficient documentation

## 2024-03-31 HISTORY — PX: IR IMAGING GUIDED PORT INSERTION: IMG5740

## 2024-03-31 LAB — GLUCOSE, CAPILLARY: Glucose-Capillary: 84 mg/dL (ref 70–99)

## 2024-03-31 MED ORDER — FENTANYL CITRATE (PF) 100 MCG/2ML IJ SOLN
INTRAMUSCULAR | Status: AC | PRN
  Administered 2024-03-31: 50 ug via INTRAVENOUS

## 2024-03-31 MED ORDER — HEPARIN SOD (PORK) LOCK FLUSH 100 UNIT/ML IV SOLN
INTRAVENOUS | Status: AC
  Filled 2024-03-31: qty 5

## 2024-03-31 MED ORDER — LIDOCAINE-EPINEPHRINE 1 %-1:100000 IJ SOLN
INTRAMUSCULAR | Status: AC
  Filled 2024-03-31: qty 20

## 2024-03-31 MED ORDER — SODIUM CHLORIDE 0.9 % IV SOLN: INTRAVENOUS | Status: DC

## 2024-03-31 MED ORDER — HEPARIN SOD (PORK) LOCK FLUSH 100 UNIT/ML IV SOLN
500.0000 [IU] | Freq: Once | INTRAVENOUS | Status: AC
  Administered 2024-03-31: 500 [IU] via INTRAVENOUS

## 2024-03-31 MED ORDER — LIDOCAINE-EPINEPHRINE 1 %-1:100000 IJ SOLN
20.0000 mL | Freq: Once | INTRAMUSCULAR | Status: AC
  Administered 2024-03-31: 20 mL via INTRADERMAL

## 2024-03-31 MED ORDER — MIDAZOLAM HCL 2 MG/2ML IJ SOLN
INTRAMUSCULAR | Status: AC
  Filled 2024-03-31: qty 2

## 2024-03-31 MED ORDER — FENTANYL CITRATE (PF) 100 MCG/2ML IJ SOLN
INTRAMUSCULAR | Status: AC
  Filled 2024-03-31: qty 2

## 2024-03-31 MED ORDER — MIDAZOLAM HCL (PF) 2 MG/2ML IJ SOLN
INTRAMUSCULAR | Status: AC | PRN
  Administered 2024-03-31: 1 mg via INTRAVENOUS

## 2024-03-31 NOTE — Sedation Documentation (Signed)
 RN Christoffer Currier pulled 2 mg Versed  and 100 mcg Fentanyl  in IR room. Pt. Received 2 mg Versed  and 100 mcg Fentanyl  throughout the procedure.

## 2024-03-31 NOTE — Procedures (Signed)
 Interventional Radiology Procedure Note  Procedure: Single Lumen Power Port Placement    Access:  Right internal jugular vein  Findings: Catheter tip positioned at cavoatrial junction. Port is ready for immediate use.   Complications: None  EBL: < 10 mL  Recommendations:  - Ok to shower in 24 hours - Do not submerge for 7 days - Routine line care    Ester Sides, MD

## 2024-03-31 NOTE — Progress Notes (Signed)
 1310 Ice bag given for comfort to right upper neck and right upper .chest per instructions/

## 2024-03-31 NOTE — H&P (Signed)
 "   Chief Complaint: Right lower lobe of right lung neoplasm - IR consulted for port-a-catheter placement for chemotherapy  Referring Provider(s): Sherrod Sherrod, MD   Supervising Physician: Jennefer Rover  Patient Status: [name]  History of Present Illness: Blake Mcknight is a 69 y.o. male with hx of stage IV non-small cell lung cancer with hepatic metastasis, confirmed by liver biopsy November 2025. Pt has established care with Dr. Sherrod of oncology. Chemotherapy is planned for the lung cancer. IR now consulted for port-a-catheter placement.  Today patient without complaint. Has been NPO since midnight. Family at bedside. All questions answered.   Patient is Full Code  Past Medical History:  Diagnosis Date   Abdominal pain 06/03/2013   Chest pain 08/10/2023   COPD (chronic obstructive pulmonary disease) (HCC)    with bullous emphysema.    COPD exacerbation (HCC) 08/09/2023   Heavy cigarette smoker before 2003   pt claims only 10 cigs per day, never heavier amounts.    HLD (hyperlipidemia)    Hypertension    Schizophrenia (HCC) 06/28/2013   This is a chronic condition and he lives with family    Past Surgical History:  Procedure Laterality Date   CHOLECYSTECTOMY N/A 06/06/2013   Procedure: LAPAROSCOPIC CHOLECYSTECTOMY WITH ATTEMPTED INTRAOPERATIVE CHOLANGIOGRAM;  Surgeon: Dann FORBES Hummer, MD;  Location: [facility] OR;  Service: General;  Laterality: N/A;   ERCP N/A 06/03/2013   Procedure: ENDOSCOPIC RETROGRADE CHOLANGIOPANCREATOGRAPHY (ERCP);  Surgeon: Gwendlyn ONEIDA Buddy, MD;  Location: [facility] ENDOSCOPY;  Service: Endoscopy;  Laterality: N/A;    Allergies: Patient has no known allergies.  Medications: Prior to Admission medications  Medication Sig Start Date End Date Taking? Authorizing Provider  acetaminophen  (TYLENOL ) 500 MG tablet Take 2 tablets (1,000 mg total) by mouth every 8 (eight) hours. 08/14/23   Fausto Sor A, DO  albuterol  (PROVENTIL ) (2.5 MG/3ML)  0.083% nebulizer solution Take 2.5 mg by nebulization every 6 (six) hours as needed. 01/06/24   [provider]  albuterol  (VENTOLIN  HFA) 108 (90 Base) MCG/ACT inhaler Inhale 2 puffs into the lungs every 4 (four) hours as needed for wheezing or shortness of breath. 10/02/22   Bernard Drivers, MD  apixaban  (ELIQUIS ) 5 MG TABS tablet Take 1 tablet (5 mg total) by mouth 2 (two) times daily. 10/22/23   Shelah Lamar RAMAN, MD  Artificial Tear Ointment (DRY EYES OP) Place 1 drop into both eyes 2 (two) times daily as needed (itichy eyes).    [provider]  atorvastatin  (LIPITOR) 10 MG tablet Take 1 tablet (10 mg total) by mouth at bedtime. 08/14/23   Fausto Sor A, DO  benztropine  (COGENTIN ) 1 MG tablet Take 1 tablet (1 mg total) by mouth 2 (two) times daily. 02/03/24   Arfeen, Leni ONEIDA, MD  carvedilol  (COREG ) 3.125 MG tablet Take 2 tablets (6.25 mg total) by mouth 2 (two) times daily with a meal. 09/10/23   Will Almarie MATSU, MD  cetirizine (ZYRTEC) 10 MG tablet Take 10 mg by mouth daily as needed for allergies or rhinitis.    [provider]  cholecalciferol  (VITAMIN D ) 25 MCG (1000 UNIT) tablet Take 2,000-4,000 Units by mouth every morning.    [provider]  cyanocobalamin  1000 MCG tablet Take 1,000 mcg by mouth daily. Patient taking differently: Take 1,000 mcg by mouth as needed.    [provider]  famotidine  (PEPCID ) 40 MG tablet Take 40 mg by mouth 2 (two) times daily. 12/28/18   [provider]  feeding supplement,  GLUCERNA SHAKE, (GLUCERNA SHAKE) LIQD Take 237 mLs by mouth 3 (three) times daily between meals. Patient not taking: Reported on 02/16/2024 08/14/23   Fausto Sor A, DO  Fluticasone -Umeclidin-Vilant (TRELEGY ELLIPTA ) 100-62.5-25 MCG/ACT AEPB Inhale 1 puff into the lungs daily. 01/01/24   Parrett, Madelin RAMAN, NP  guaiFENesin  (MUCINEX ) 600 MG 12 hr tablet Take 1 tablet (600 mg total) by mouth 2 (two) times daily as needed. 02/07/24   Amin,  Sumayya, MD  hydrOXYzine  (ATARAX ) 25 MG tablet Take 1 tablet (25 mg total) by mouth 3 (three) times daily as needed for anxiety. 10/12/23   Sherrill Cable Latif, DO  ipratropium-albuterol  (DUONEB) 0.5-2.5 (3) MG/3ML SOLN Take 3 mLs by nebulization every 4 (four) hours. Patient taking differently: Take 3 mLs by nebulization every 4 (four) hours as needed. 08/14/23   Fausto Sor A, DO  JANUVIA 100 MG tablet Take 100 mg by mouth every evening. 01/30/23   [provider]  lidocaine  (LIDODERM ) 5 % Place 1 patch onto the skin daily. Remove & Discard patch within 12 hours or as directed by MD Patient taking differently: Place 1 patch onto the skin daily as needed (pain). Remove & Discard patch within 12 hours or as directed by MD 09/10/23   Will Almarie MATSU, MD  lidocaine -prilocaine  (EMLA ) cream Apply to affected area once 03/21/24   Sherrod Sherrod, MD  methocarbamol  (ROBAXIN ) 750 MG tablet Take 1 tablet (750 mg total) by mouth every 8 (eight) hours as needed for muscle spasms. 09/10/23   Will Almarie MATSU, MD  mirtazapine  (REMERON ) 30 MG tablet Take 1 tablet (30 mg total) by mouth at bedtime as needed (insomnia). 02/03/24 02/02/25  Arfeen, Leni DASEN, MD  multivitamin (ONE-A-DAY MEN'S) TABS tablet Take 1 tablet by mouth daily.    [provider]  ondansetron  (ZOFRAN ) 8 MG tablet Take 1 tablet (8 mg total) by mouth every 8 (eight) hours as needed for nausea or vomiting. Start on the third day after carboplatin. 03/21/24   Sherrod Sherrod, MD  OXYGEN  Inhale 2 L/min into the lungs continuous.     [provider]  polyethylene glycol (MIRALAX  / GLYCOLAX ) 17 g packet Take 17 g by mouth daily. 09/10/23   Will Almarie MATSU, MD  predniSONE  (DELTASONE ) 20 MG tablet Take 40 mg by mouth daily for 3 days, then 30 mg by mouth daily for 3 days, then go back on your usual maintenance regimen of prednisone  20 mg by mouth daily. Patient taking differently: Take 20 mg by mouth daily with  breakfast. For congestion 11/29/23   Ghimire, Donalda HERO, MD  prochlorperazine  (COMPAZINE ) 10 MG tablet Take 1 tablet (10 mg total) by mouth every 6 (six) hours as needed for nausea or vomiting. 03/21/24   Sherrod Sherrod, MD  risperiDONE  (RISPERDAL ) 2 MG tablet Take 1 tablet (2 mg total) by mouth 2 (two) times daily. 02/03/24   Arfeen, Leni DASEN, MD  sodium chloride  (OCEAN) 0.65 % SOLN nasal spray Place 1 spray into both nostrils as needed for congestion.    [provider]  tamsulosin  (FLOMAX ) 0.4 MG CAPS capsule Take 0.4 mg by mouth in the morning and at bedtime. 07/23/20   [provider]  traMADol  (ULTRAM ) 50 MG tablet Take 1 tablet (50 mg total) by mouth every 6 (six) hours as needed. 10/21/23   Shelah Lamar RAMAN, MD     Family History  Problem Relation Age of Onset   Diabetes Mellitus II Mother    Diabetes Mother  Heart disease Father    Stroke Maternal Aunt    CAD Neg Hx     Social History   Socioeconomic History   Marital status: Single    Spouse name: Not on file   Number of children: Not on file   Years of education: Not on file   Highest education level: Not on file  Occupational History   Occupation: disabled  Tobacco Use   Smoking status: Former    Current packs/day: 1.00    Average packs/day: 1 pack/day for 41.0 years (41.0 ttl pk-yrs)    Types: Cigarettes   Smokeless tobacco: Never   Tobacco comments:    Quit smoking 2023  Vaping Use   Vaping status: Never Used  Substance and Sexual Activity   Alcohol use: No   Drug use: No   Sexual activity: Yes  Other Topics Concern   Not on file  Social History Narrative   Not on file   Social Drivers of Health   Tobacco Use: Medium Risk (03/31/2024)   Patient History    Smoking Tobacco Use: Former    Smokeless Tobacco Use: Never    Passive Exposure: Not on Actuary Strain: Not on file  Food Insecurity: Patient Unable To Answer (11/27/2023)   Epic    Worried About Programme Researcher, Broadcasting/film/video in  the Last Year: Patient unable to answer    Ran Out of Food in the Last Year: Patient unable to answer  Transportation Needs: Patient Unable To Answer (11/28/2023)   Epic    Lack of Transportation (Medical): Patient unable to answer    Lack of Transportation (Non-Medical): Patient unable to answer  Physical Activity: Not on file  Stress: Not on file  Social Connections: Unknown (11/27/2023)   Social Connection and Isolation Panel    Frequency of Communication with Friends and Family: Patient unable to answer    Frequency of Social Gatherings with Friends and Family: Patient unable to answer    Attends Religious Services: Patient unable to answer    Active Member of Clubs or Organizations: Patient unable to answer    Attends Banker Meetings: Not on file    Marital Status: Not on file  Recent Concern: Social Connections - Socially Isolated (10/27/2023)   Social Connection and Isolation Panel    Frequency of Communication with Friends and Family: Never    Frequency of Social Gatherings with Friends and Family: Never    Attends Religious Services: Never    Database Administrator or Organizations: No    Attends Banker Meetings: Never    Marital Status: Never married  Depression (PHQ2-9): Not on file  Alcohol Screen: Not on file  Housing: Patient Unable To Answer (11/27/2023)   Epic    Unable to Pay for Housing in the Last Year: Patient unable to answer    Number of Times Moved in the Last Year: Not on file    Homeless in the Last Year: Patient unable to answer  Utilities: Patient Unable To Answer (11/28/2023)   Epic    Threatened with loss of utilities: Patient unable to answer  Health Literacy: Not on file     Review of Systems: A 12 point ROS discussed and pertinent positives are indicated in the HPI above.  All other systems are negative.  Vital Signs: BP (!) 132/90   Pulse 92   Temp 98.1 F (36.7 C) (Oral)   Resp 16   SpO2 96%   Advance  Care Plan:  No documents on file  Physical Exam Vitals and nursing note reviewed.  Constitutional:      General: He is not in acute distress. HENT:     Mouth/Throat:     Mouth: Mucous membranes are moist.     Pharynx: Oropharynx is clear.  Cardiovascular:     Rate and Rhythm: Normal rate and regular rhythm.  Pulmonary:     Effort: Pulmonary effort is normal.     Breath sounds: Normal breath sounds.  Abdominal:     Palpations: Abdomen is soft.     Tenderness: There is no abdominal tenderness.  Musculoskeletal:     Right lower leg: No edema.     Left lower leg: No edema.  Skin:    General: Skin is warm and dry.  Neurological:     Mental Status: He is alert and oriented to person, place, and time. Mental status is at baseline.     Imaging: No results found.  Labs:  CBC: Recent Labs    02/04/24 1447 02/06/24 2015 02/06/24 2123 02/07/24 1015 02/25/24 0843  WBC 9.9 15.3*  --  8.2 11.2*  HGB 12.4* 12.9* 13.9 12.5* 13.0  HCT 38.6* 40.8 41.0 38.2* 39.6  PLT 300 291  --  283 317    COAGS: Recent Labs    10/08/23 1431 10/09/23 0351 10/27/23 0137 02/04/24 1447  INR  --   --  1.3* 1.0  APTT 91* 42*  --   --     BMP: Recent Labs    11/28/23 0418 01/01/24 1254 02/06/24 2015 02/06/24 2123 02/07/24 1015 02/25/24 0843  NA 136 135 135 135 132* 137  K 4.5 4.5 4.4 4.6 4.2 4.2  CL 102 98 100  --  99 99  CO2 22 27 17*  --  21* 28  GLUCOSE 170* 113* 97  --  172* 96  BUN 7* 8 9  --  10 6*  CALCIUM  8.8* 9.4 8.5*  --  8.5* 9.6  CREATININE 0.61 0.69 0.75  --  0.66 0.76  GFRNONAA >60  --  >60  --  >60 >60    LIVER FUNCTION TESTS: Recent Labs    10/12/23 0350 11/27/23 0155 02/06/24 2015 02/25/24 0843  BILITOT 0.3 0.8 1.0 0.5  AST 18 20 16  13*  ALT 24 12 13 12   ALKPHOS 60 81 70 82  PROT 6.1* 6.9 6.7 7.5  ALBUMIN 2.4* 2.9* 2.8* 3.9    TUMOR MARKERS: No results for input(s): AFPTM, CEA, CA199, CHROMGRNA in the last 8760 hours.  Assessment and Plan:  KYRE JEFFRIES is a 69 y.o. male with hx of stage IV non-small cell lung cancer with hepatic metastasis, confirmed by liver biopsy November 2025. Pt has established care with Dr. Sherrod of oncology. Chemotherapy is planned for the lung cancer. IR now consulted for port-a-catheter placement.  IR planning port-a-catheter placement for chemotherapy today 03/31/24 - Pt has been NPO since midnight  Risks and benefits of image-guided Port-a-catheter placement were discussed with the patient and sister including, but not limited to bleeding, infection, pneumothorax, or fibrin sheath development and need for additional procedures. All of the patient and sister's questions were answered, patient and sister are agreeable to proceed. Consent signed and in chart.  Thank you for allowing our service to participate in Blake Mcknight 's care.  Electronically Signed: Kimble VEAR Clas, PA-C   03/31/2024, 10:07 AM      I spent a total of  15  Minutes   in face to face in clinical consultation, greater than 50% of which was counseling/coordinating care for port-a-catheter placement  "

## 2024-03-31 NOTE — Discharge Instructions (Addendum)
 Discharge Instructions:   Please call Interventional Radiology clinic [phone] with any questions or concerns.  You may remove your dressing and shower tomorrow.  Do not use EMLA  / Lidocaine  cream for 2 weeks post Port Insertion this will remove the surgical glue from the incision.   Moderate Conscious Sedation, Adult, Care After  This sheet gives you information about how to care for yourself after your procedure. Your health care provider may also give you more specific instructions. If you have problems or questions, contact your health care provider. What can I expect after the procedure? After the procedure, it is common to have: Sleepiness for several hours. Impaired judgment for several hours. Difficulty with balance. Vomiting if you eat too soon. Follow these instructions at home: For the time period you were told by your health care provider: Rest. Do not participate in activities where you could fall or become injured. Do not drive or use machinery. Do not drink alcohol. Do not take sleeping pills or medicines that cause drowsiness. Do not make important decisions or sign legal documents. Do not take care of children on your own. Eating and drinking  Follow the diet recommended by your health care provider. Drink enough fluid to keep your urine pale yellow. If you vomit: Drink water , juice, or soup when you can drink without vomiting. Make sure you have little or no nausea before eating solid foods. General instructions Take over-the-counter and prescription medicines only as told by your health care provider. Have a responsible adult stay with you for the time you are told. It is important to have someone help care for you until you are awake and alert. Do not smoke. Keep all follow-up visits as told by your health care provider. This is important. Contact a health care provider if: You are still sleepy or having trouble with balance after 24 hours. You feel  light-headed. You keep feeling nauseous or you keep vomiting. You develop a rash. You have a fever. You have redness or swelling around the IV site. Get help right away if: You have trouble breathing. You have new-onset confusion at home. Summary After the procedure, it is common to feel sleepy, have impaired judgment, or feel nauseous if you eat too soon. Rest after you get home. Know the things you should not do after the procedure. Follow the diet recommended by your health care provider and drink enough fluid to keep your urine pale yellow. Get help right away if you have trouble breathing or new-onset confusion at home. This information is not intended to replace advice given to you by your health care provider. Make sure you discuss any questions you have with your health care provider. Document Revised: 07/01/2019 Document Reviewed: 01/27/2019 Elsevier Patient Education  2023 Else[employer] Inc[employer]    Implanted [place] Insertion, Care After  The following information offers guidance on how to care for yourself after your procedure. Your health care provider may also give you more specific instructions. If you have problems or questions, contact your health care provider. What can I expect after the procedure? After the procedure, it is common to have: Discomfort at the port insertion site. Bruising on the skin over the port. This should improve over 3-4 days. Follow these instructions at home: [facility] care After your port is placed, you will get a manufacturer's information card. The card has information about your port. Keep this card with you at all times. Take care of the port as told by your health care provider.  Ask your health care provider if you or a family member can get training for taking care of the port at home. A home health care nurse will be be available to help care for the port. Make sure to remember what type of port you have. Incision care     Follow instructions from  your health care provider about how to take care of your port insertion site. Make sure you: Wash your hands with soap and water  for at least 20 seconds before and after you change your bandage (dressing). If soap and water  are not available, use hand sanitizer. Change your dressing as told by your health care provider. Leave stitches (sutures), skin glue, or adhesive strips in place. These skin closures may need to stay in place for 2 weeks or longer. If adhesive strip edges start to loosen and curl up, you may trim the loose edges. Do not remove adhesive strips completely unless your health care provider tells you to do that. Check your port insertion site every day for signs of infection. Check for: Redness, swelling, or pain. Fluid or blood. Warmth. Pus or a bad smell. Activity Return to your normal activities as told by your health care provider. Ask your health care provider what activities are safe for you. You may have to avoid lifting. Ask your health care provider how much you can safely lift. General instructions Take over-the-counter and prescription medicines only as told by your health care provider. Do not take baths, swim, or use a hot tub until your health care provider approves. Ask your health care provider if you may take showers. You may only be allowed to take sponge baths. If you were given a sedative during the procedure, it can affect you for several hours. Do not drive or operate machinery until your health care provider says that it is safe. Wear a medical alert bracelet in case of an emergency. This will tell any health care providers that you have a port. Keep all follow-up visits. This is important. Contact a health care provider if: You cannot flush your port with saline as directed, or you cannot draw blood from the port. You have a fever or chills. You have redness, swelling, or pain around your port insertion site. You have fluid or blood coming from your port  insertion site. Your port insertion site feels warm to the touch. You have pus or a bad smell coming from the port insertion site. Get help right away if: You have chest pain or shortness of breath. You have bleeding from your port that you cannot control. These symptoms may be an emergency. Get help right away. Call 911. Do not wait to see if the symptoms will go away. Do not drive yourself to the hospital. Summary Take care of the port as told by your health care provider. Keep the manufacturer's information card with you at all times. Change your dressing as told by your health care provider. Contact a health care provider if you have a fever or chills or if you have redness, swelling, or pain around your port insertion site. Keep all follow-up visits. This information is not intended to replace advice given to you by your health care provider. Make sure you discuss any questions you have with your health care provider. Document Revised: 09/04/2020 Document Reviewed: 09/04/2020 Elsevier Patient Education  2023 [employer].

## 2024-04-01 NOTE — Progress Notes (Unsigned)
 [facility] OFFICE PROGRESS NOTE  Blake Bare, MD [address] [place] [address]  DIAGNOSIS: Stage IV (T2b, N2, M1 C) non-small cell lung cancer, squamous cell carcinoma presented with right lower lobe lung mass in addition to subcarinal lymphadenopathy as well as hepatic metastasis diagnosed in November 2025.   PRIOR THERAPY: None  CURRENT THERAPY: palliative systemic chemoimmunotherapy with carboplatin for AUC of 5, paclitaxel 175 MGs/M2 and Libtayo (Cempilimab) 350 mg IV every 3 weeks. First dose on 04/05/23.  INTERVAL HISTORY: Blake Mcknight 69 y.o. male returns to the clinic today for a follow up visit. The patient was last seen in the clinic by Dr. Sherrod on 02/25/24. The patient was given the option of hospice vs chemo/immunotherapy. The patient and his sister decided to proceed with treatment. In the interval since last being seen, he had a chemo ed class and had a port-a-cath placed. He does not have any infections.   He denies any changes in his health since last being seen. At his last appointment he was endorsing left sided back discomfort. Denied fever, chills, or night sweats. Denied nausea, vomiting, diarrhea, or constipation. Denies headache or vision changes. His breathing is ***. ***cough. He denies hemoptysis or chest pain. He denies rashes. He is here for evaluation and repeat blood work before undergoing cycle #1.   MEDICAL HISTORY: Past Medical History:  Diagnosis Date   Abdominal pain 06/03/2013   Chest pain 08/10/2023   COPD (chronic obstructive pulmonary disease) (HCC)    with bullous emphysema.    COPD exacerbation (HCC) 08/09/2023   Heavy cigarette smoker before 2003   pt claims only 10 cigs per day, never heavier amounts.    HLD (hyperlipidemia)    Hypertension    Schizophrenia (HCC) 06/28/2013   This is a chronic condition and he lives with family    ALLERGIES:  has no known allergies.  MEDICATIONS:  Current  Outpatient Medications  Medication Sig Dispense Refill   acetaminophen  (TYLENOL ) 500 MG tablet Take 2 tablets (1,000 mg total) by mouth every 8 (eight) hours. 30 tablet 0   albuterol  (PROVENTIL ) (2.5 MG/3ML) 0.083% nebulizer solution Take 2.5 mg by nebulization every 6 (six) hours as needed.     albuterol  (VENTOLIN  HFA) 108 (90 Base) MCG/ACT inhaler Inhale 2 puffs into the lungs every 4 (four) hours as needed for wheezing or shortness of breath. 1 each 1   apixaban  (ELIQUIS ) 5 MG TABS tablet Take 1 tablet (5 mg total) by mouth 2 (two) times daily.     Artificial Tear Ointment (DRY EYES OP) Place 1 drop into both eyes 2 (two) times daily as needed (itichy eyes).     atorvastatin  (LIPITOR) 10 MG tablet Take 1 tablet (10 mg total) by mouth at bedtime. 30 tablet 1   benztropine  (COGENTIN ) 1 MG tablet Take 1 tablet (1 mg total) by mouth 2 (two) times daily. 180 tablet 0   carvedilol  (COREG ) 3.125 MG tablet Take 2 tablets (6.25 mg total) by mouth 2 (two) times daily with a meal.     cetirizine (ZYRTEC) 10 MG tablet Take 10 mg by mouth daily as needed for allergies or rhinitis.     cholecalciferol  (VITAMIN D ) 25 MCG (1000 UNIT) tablet Take 2,000-4,000 Units by mouth every morning.     cyanocobalamin  1000 MCG tablet Take 1,000 mcg by mouth daily. (Patient taking differently: Take 1,000 mcg by mouth as needed.)     famotidine  (PEPCID ) 40 MG tablet Take 40 mg  by mouth 2 (two) times daily.     feeding supplement, GLUCERNA SHAKE, (GLUCERNA SHAKE) LIQD Take 237 mLs by mouth 3 (three) times daily between meals. (Patient not taking: Reported on 02/16/2024)     Fluticasone -Umeclidin-Vilant (TRELEGY ELLIPTA ) 100-62.5-25 MCG/ACT AEPB Inhale 1 puff into the lungs daily.     guaiFENesin  (MUCINEX ) 600 MG 12 hr tablet Take 1 tablet (600 mg total) by mouth 2 (two) times daily as needed. 30 tablet 0   hydrOXYzine  (ATARAX ) 25 MG tablet Take 1 tablet (25 mg total) by mouth 3 (three) times daily as needed for anxiety. 30  tablet 0   ipratropium-albuterol  (DUONEB) 0.5-2.5 (3) MG/3ML SOLN Take 3 mLs by nebulization every 4 (four) hours. (Patient taking differently: Take 3 mLs by nebulization every 4 (four) hours as needed.)     JANUVIA 100 MG tablet Take 100 mg by mouth every evening.     lidocaine  (LIDODERM ) 5 % Place 1 patch onto the skin daily. Remove & Discard patch within 12 hours or as directed by MD (Patient taking differently: Place 1 patch onto the skin daily as needed (pain). Remove & Discard patch within 12 hours or as directed by MD) 30 patch 0   lidocaine -prilocaine  (EMLA ) cream Apply to affected area once 30 g 3   methocarbamol  (ROBAXIN ) 750 MG tablet Take 1 tablet (750 mg total) by mouth every 8 (eight) hours as needed for muscle spasms. 30 tablet 0   mirtazapine  (REMERON ) 30 MG tablet Take 1 tablet (30 mg total) by mouth at bedtime as needed (insomnia). 90 tablet 0   multivitamin (ONE-A-DAY MEN'S) TABS tablet Take 1 tablet by mouth daily.     ondansetron  (ZOFRAN ) 8 MG tablet Take 1 tablet (8 mg total) by mouth every 8 (eight) hours as needed for nausea or vomiting. Start on the third day after carboplatin. 30 tablet 1   OXYGEN  Inhale 2 L/min into the lungs continuous.      polyethylene glycol (MIRALAX  / GLYCOLAX ) 17 g packet Take 17 g by mouth daily. 14 each 0   predniSONE  (DELTASONE ) 20 MG tablet Take 40 mg by mouth daily for 3 days, then 30 mg by mouth daily for 3 days, then go back on your usual maintenance regimen of prednisone  20 mg by mouth daily. (Patient taking differently: Take 20 mg by mouth daily with breakfast. For congestion) 30 tablet 1   prochlorperazine  (COMPAZINE ) 10 MG tablet Take 1 tablet (10 mg total) by mouth every 6 (six) hours as needed for nausea or vomiting. 30 tablet 1   risperiDONE  (RISPERDAL ) 2 MG tablet Take 1 tablet (2 mg total) by mouth 2 (two) times daily. 180 tablet 0   sodium chloride  (OCEAN) 0.65 % SOLN nasal spray Place 1 spray into both nostrils as needed for  congestion.     tamsulosin  (FLOMAX ) 0.4 MG CAPS capsule Take 0.4 mg by mouth in the morning and at bedtime.     traMADol  (ULTRAM ) 50 MG tablet Take 1 tablet (50 mg total) by mouth every 6 (six) hours as needed. 40 tablet 0   No current facility-administered medications for this visit.    SURGICAL HISTORY:  Past Surgical History:  Procedure Laterality Date   CHOLECYSTECTOMY N/A 06/06/2013   Procedure: LAPAROSCOPIC CHOLECYSTECTOMY WITH ATTEMPTED INTRAOPERATIVE CHOLANGIOGRAM;  Surgeon: Dann FORBES Hummer, MD;  Location: [facility] OR;  Service: General;  Laterality: N/A;   ERCP N/A 06/03/2013   Procedure: ENDOSCOPIC RETROGRADE CHOLANGIOPANCREATOGRAPHY (ERCP);  Surgeon: Gwendlyn ONEIDA Buddy, MD;  Location: [facility] ENDOSCOPY;  Service: Endoscopy;  Laterality: N/A;   IR IMAGING GUIDED PORT INSERTION  03/31/2024    REVIEW OF SYSTEMS:   Review of Systems  Constitutional: Negative for appetite change, chills, fatigue, fever and unexpected weight change.  HENT:   Negative for mouth sores, nosebleeds, sore throat and trouble swallowing.   Eyes: Negative for eye problems and icterus.  Respiratory: Negative for cough, hemoptysis, shortness of breath and wheezing.   Cardiovascular: Negative for chest pain and leg swelling.  Gastrointestinal: Negative for abdominal pain, constipation, diarrhea, nausea and vomiting.  Genitourinary: Negative for bladder incontinence, difficulty urinating, dysuria, frequency and hematuria.   Musculoskeletal: Negative for back pain, gait problem, neck pain and neck stiffness.  Skin: Negative for itching and rash.  Neurological: Negative for dizziness, extremity weakness, gait problem, headaches, light-headedness and seizures.  Hematological: Negative for adenopathy. Does not bruise/bleed easily.  Psychiatric/Behavioral: Negative for confusion, depression and sleep disturbance. The patient is not nervous/anxious.     PHYSICAL EXAMINATION:  There were no vitals taken for this visit.  ECOG  PERFORMANCE STATUS: {CHL ONC ECOG [id number]  Physical Exam  Constitutional: Oriented to person, place, and time and well-developed, well-nourished, and in no distress. No distress.  HENT:  Head: Normocephalic and atraumatic.  Mouth/Throat: Oropharynx is clear and moist. No oropharyngeal exudate.  Eyes: Conjunctivae are normal. Right eye exhibits no discharge. Left eye exhibits no discharge. No scleral icterus.  Neck: Normal range of motion. Neck supple.  Cardiovascular: Normal rate, regular rhythm, normal heart sounds and intact distal pulses.   Pulmonary/Chest: Effort normal and breath sounds normal. No respiratory distress. No wheezes. No rales.  Abdominal: Soft. Bowel sounds are normal. Exhibits no distension and no mass. There is no tenderness.  Musculoskeletal: Normal range of motion. Exhibits no edema.  Lymphadenopathy:    No cervical adenopathy.  Neurological: Alert and oriented to person, place, and time. Exhibits normal muscle tone. Gait normal. Coordination normal.  Skin: Skin is warm and dry. No rash noted. Not diaphoretic. No erythema. No pallor.  Psychiatric: Mood, memory and judgment normal.  Vitals reviewed.  LABORATORY DATA: Lab Results  Component Value Date   WBC 11.2 (H) 02/25/2024   HGB 13.0 02/25/2024   HCT 39.6 02/25/2024   MCV 80.3 02/25/2024   PLT 317 02/25/2024      Chemistry      Component Value Date/Time   NA 137 02/25/2024 0843   K 4.2 02/25/2024 0843   CL 99 02/25/2024 0843   CO2 28 02/25/2024 0843   BUN 6 (L) 02/25/2024 0843   CREATININE 0.76 02/25/2024 0843      Component Value Date/Time   CALCIUM  9.6 02/25/2024 0843   ALKPHOS 82 02/25/2024 0843   AST 13 (L) 02/25/2024 0843   ALT 12 02/25/2024 0843   BILITOT 0.5 02/25/2024 0843       RADIOGRAPHIC STUDIES:  IR IMAGING GUIDED PORT INSERTION Result Date: 03/31/2024 INDICATION: 69 year old male with history of advanced stage lung cancer requiring central venous access for  chemotherapy administration. EXAM: IMPLANTED PORT A CATH PLACEMENT WITH ULTRASOUND AND FLUOROSCOPIC GUIDANCE COMPARISON:  None Available. MEDICATIONS: None ANESTHESIA/SEDATION: Moderate (conscious) sedation was employed during this procedure. A total of Versed  2 mg and Fentanyl  100 mcg was administered intravenously. Moderate Sedation Time: 17 minutes. The patient's level of consciousness and vital signs were monitored continuously by radiology nursing throughout the procedure under my direct supervision. CONTRAST:  None FLUOROSCOPY TIME:  One mGy reference air kerma COMPLICATIONS: None immediate. PROCEDURE: The procedure, risks, benefits,  and alternatives were explained to the patient. Questions regarding the procedure were encouraged and answered. The patient understands and consents to the procedure. The right neck and chest were prepped with chlorhexidine  in a sterile fashion, and a sterile drape was applied covering the operative field. Maximum barrier sterile technique with sterile gowns and gloves were used for the procedure. A timeout was performed prior to the initiation of the procedure. Ultrasound was used to examine the jugular vein which was compressible and free of internal echoes. A skin marker was used to demarcate the planned venotomy and port pocket incision sites. Local anesthesia was provided to these sites and the subcutaneous tunnel track with 1% lidocaine  with 1:100,000 epinephrine . A small incision was created at the jugular access site and blunt dissection was performed of the subcutaneous tissues. Under ultrasound guidance, the jugular vein was accessed with a 21 ga micropuncture needle and an 0.018 wire was inserted to the superior vena cava. Real-time ultrasound guidance was utilized for vascular access including the acquisition of a permanent ultrasound image documenting patency of the accessed vessel. A 5 Fr micopuncture set was then used, through which a 0.035 Rosen wire was passed  under fluoroscopic guidance into the inferior vena cava. An 8 Fr dilator was then placed over the wire. A subcutaneous port pocket was then created along the upper chest wall utilizing a combination of sharp and blunt dissection. The pocket was irrigated with sterile saline, packed with gauze, and observed for hemorrhage. A single lumen clear view power injectable port was chosen for placement. The 8 Fr catheter was tunneled from the port pocket site to the venotomy incision. The port was placed in the pocket. The external catheter was trimmed to appropriate length. The dilator was exchanged for an 8 Fr peel-away sheath under fluoroscopic guidance. The catheter was then placed through the sheath and the sheath was removed. Final catheter positioning was confirmed and documented with a fluoroscopic spot radiograph. The port was accessed with a Huber needle, aspirated, and flushed with heparinized saline. The deep dermal layer of the port pocket incision was closed with interrupted 3-0 Vicryl suture. Dermabond was then placed over the port pocket and neck incisions. The patient tolerated the procedure well without immediate post procedural complication. FINDINGS: After catheter placement, the tip lies within the superior cavoatrial junction. The catheter aspirates and flushes normally and is ready for immediate use. IMPRESSION: Successful placement of a power injectable Port-A-Cath via the right internal jugular vein. The catheter is ready for immediate use. Ester Sides, MD [facility] Radiology Electronically Signed   By: Ester Sides M.D.   On: 03/31/2024 12:36     ASSESSMENT/PLAN:  Stage IV (T2b, N2, M1 C) non-small cell lung cancer, squamous cell carcinoma presented with right lower lobe lung mass in addition to subcarinal lymphadenopathy as well as hepatic metastasis diagnosed in November 2025.   He is currently scheduled to start chemotherapy and  immunotherapy with carboplatin for an AUC of 5 and taxol 175 mg/m2 and libtayo 350 mg IV every 3 weeks.   Labs were reviewed. Recommend he *** with cycle #1 today as scheduled.   We will see him back for labs and follow up visit in 1 week for 1 week follow up toxicity check.   The patient was advised to call immediately if she has any concerning symptoms in the interval. The patient voices understanding of current disease status and treatment options and is in agreement with the current  care plan. All questions were answered. The patient knows to call the clinic with any problems, questions or concerns. We can certainly see the patient much sooner if necessary    No orders of the defined types were placed in this encounter.    I spent {CHL ONC TIME VISIT - DTPQU:[id number]} counseling the patient face to face. The total time spent in the appointment was {CHL ONC TIME VISIT - DTPQU:[id number]}.  Vinita Prentiss L Karinda Cabriales, PA-C 04/01/24

## 2024-04-03 ENCOUNTER — Emergency Department (HOSPITAL_COMMUNITY)

## 2024-04-03 ENCOUNTER — Other Ambulatory Visit: Payer: Self-pay

## 2024-04-03 ENCOUNTER — Encounter (HOSPITAL_COMMUNITY): Payer: Self-pay | Admitting: Internal Medicine

## 2024-04-03 ENCOUNTER — Inpatient Hospital Stay (HOSPITAL_COMMUNITY)
Admission: EM | Admit: 2024-04-03 | Discharge: 2024-04-07 | DRG: 166 | Disposition: A | Discharge status: Home / Self Care | Attending: Internal Medicine | Admitting: Internal Medicine

## 2024-04-03 DIAGNOSIS — Z86711 Personal history of pulmonary embolism: Secondary | ICD-10-CM | POA: Diagnosis not present

## 2024-04-03 DIAGNOSIS — Z833 Family history of diabetes mellitus: Secondary | ICD-10-CM | POA: Diagnosis not present

## 2024-04-03 DIAGNOSIS — N400 Enlarged prostate without lower urinary tract symptoms: Secondary | ICD-10-CM | POA: Diagnosis present

## 2024-04-03 DIAGNOSIS — J9621 Acute and chronic respiratory failure with hypoxia: Secondary | ICD-10-CM | POA: Diagnosis present

## 2024-04-03 DIAGNOSIS — F200 Paranoid schizophrenia: Secondary | ICD-10-CM | POA: Diagnosis present

## 2024-04-03 DIAGNOSIS — Z7984 Long term (current) use of oral hypoglycemic drugs: Secondary | ICD-10-CM

## 2024-04-03 DIAGNOSIS — J441 Chronic obstructive pulmonary disease with (acute) exacerbation: Principal | ICD-10-CM | POA: Diagnosis present

## 2024-04-03 DIAGNOSIS — I10 Essential (primary) hypertension: Secondary | ICD-10-CM | POA: Diagnosis present

## 2024-04-03 DIAGNOSIS — Z79899 Other long term (current) drug therapy: Secondary | ICD-10-CM | POA: Diagnosis not present

## 2024-04-03 DIAGNOSIS — F5105 Insomnia due to other mental disorder: Secondary | ICD-10-CM | POA: Diagnosis present

## 2024-04-03 DIAGNOSIS — Z7901 Long term (current) use of anticoagulants: Secondary | ICD-10-CM

## 2024-04-03 DIAGNOSIS — E119 Type 2 diabetes mellitus without complications: Secondary | ICD-10-CM | POA: Diagnosis present

## 2024-04-03 DIAGNOSIS — Z7952 Long term (current) use of systemic steroids: Secondary | ICD-10-CM

## 2024-04-03 DIAGNOSIS — J439 Emphysema, unspecified: Secondary | ICD-10-CM | POA: Diagnosis present

## 2024-04-03 DIAGNOSIS — E785 Hyperlipidemia, unspecified: Secondary | ICD-10-CM | POA: Diagnosis present

## 2024-04-03 DIAGNOSIS — F17210 Nicotine dependence, cigarettes, uncomplicated: Secondary | ICD-10-CM | POA: Diagnosis present

## 2024-04-03 DIAGNOSIS — C787 Secondary malignant neoplasm of liver and intrahepatic bile duct: Secondary | ICD-10-CM | POA: Diagnosis present

## 2024-04-03 DIAGNOSIS — F411 Generalized anxiety disorder: Secondary | ICD-10-CM | POA: Diagnosis present

## 2024-04-03 DIAGNOSIS — C3491 Malignant neoplasm of unspecified part of right bronchus or lung: Secondary | ICD-10-CM | POA: Diagnosis present

## 2024-04-03 DIAGNOSIS — Z9981 Dependence on supplemental oxygen: Secondary | ICD-10-CM

## 2024-04-03 DIAGNOSIS — Z8249 Family history of ischemic heart disease and other diseases of the circulatory system: Secondary | ICD-10-CM

## 2024-04-03 DIAGNOSIS — Z823 Family history of stroke: Secondary | ICD-10-CM | POA: Diagnosis not present

## 2024-04-03 LAB — COMPREHENSIVE METABOLIC PANEL WITH GFR
ALT: 11 U/L (ref 0–44)
AST: 18 U/L (ref 15–41)
Albumin: 3.8 g/dL (ref 3.5–5.0)
Alkaline Phosphatase: 87 U/L (ref 38–126)
Anion gap: 9 (ref 5–15)
BUN: 5 mg/dL — ABNORMAL LOW (ref 8–23)
CO2: 26 mmol/L (ref 22–32)
Calcium: 9.3 mg/dL (ref 8.9–10.3)
Chloride: 95 mmol/L — ABNORMAL LOW (ref 98–111)
Creatinine, Ser: 0.62 mg/dL (ref 0.61–1.24)
GFR, Estimated: >60 mL/min
Glucose, Bld: 167 mg/dL — ABNORMAL HIGH (ref 70–99)
Potassium: 4.2 mmol/L (ref 3.5–5.1)
Sodium: 130 mmol/L — ABNORMAL LOW (ref 135–145)
Total Bilirubin: 0.5 mg/dL (ref 0.0–1.2)
Total Protein: 7.4 g/dL (ref 6.5–8.1)

## 2024-04-03 LAB — CBC WITH DIFFERENTIAL/PLATELET
Abs Immature Granulocytes: 0.05 K/uL (ref 0.00–0.07)
Basophils Absolute: 0.1 K/uL (ref 0.0–0.1)
Basophils Relative: 1 %
Eosinophils Absolute: 0.7 K/uL — ABNORMAL HIGH (ref 0.0–0.5)
Eosinophils Relative: 6 %
HCT: 40.5 % (ref 39.0–52.0)
Hemoglobin: 13.3 g/dL (ref 13.0–17.0)
Immature Granulocytes: 0 %
Lymphocytes Relative: 12 %
Lymphs Abs: 1.6 K/uL (ref 0.7–4.0)
MCH: 26.7 pg (ref 26.0–34.0)
MCHC: 32.8 g/dL (ref 30.0–36.0)
MCV: 81.2 fL (ref 80.0–100.0)
Monocytes Absolute: 1.2 K/uL — ABNORMAL HIGH (ref 0.1–1.0)
Monocytes Relative: 10 %
Neutro Abs: 9.4 K/uL — ABNORMAL HIGH (ref 1.7–7.7)
Neutrophils Relative %: 71 %
Platelets: 285 K/uL (ref 150–400)
RBC: 4.99 MIL/uL (ref 4.22–5.81)
RDW: 15.6 % — ABNORMAL HIGH (ref 11.5–15.5)
WBC: 13 K/uL — ABNORMAL HIGH (ref 4.0–10.5)
nRBC: 0 % (ref 0.0–0.2)

## 2024-04-03 LAB — PROTIME-INR
INR: 0.7 — ABNORMAL LOW (ref 0.8–1.2)
Prothrombin Time: 10.5 s — ABNORMAL LOW (ref 11.4–15.2)

## 2024-04-03 LAB — BLOOD GAS, VENOUS
Acid-base deficit: 3.1 mmol/L — ABNORMAL HIGH (ref 0.0–2.0)
Bicarbonate: 23.2 mmol/L (ref 20.0–28.0)
O2 Saturation: 61.5 %
Patient temperature: 37
pCO2, Ven: 45 mmHg (ref 44–60)
pH, Ven: 7.32 (ref 7.25–7.43)
pO2, Ven: 41 mmHg (ref 32–45)

## 2024-04-03 LAB — TROPONIN T, HIGH SENSITIVITY
Troponin T High Sensitivity: <15 ng/L (ref 0–19)
Troponin T High Sensitivity: <15 ng/L (ref 0–19)

## 2024-04-03 LAB — PRO BRAIN NATRIURETIC PEPTIDE: Pro Brain Natriuretic Peptide: <50 pg/mL

## 2024-04-03 LAB — CREATININE, SERUM
Creatinine, Ser: 0.62 mg/dL (ref 0.61–1.24)
GFR, Estimated: >60 mL/min

## 2024-04-03 LAB — D-DIMER, QUANTITATIVE: D-Dimer, Quant: 1.63 ug{FEU}/mL — ABNORMAL HIGH (ref 0.00–0.50)

## 2024-04-03 LAB — RESP PANEL BY RT-PCR (RSV, FLU A&B, COVID)  RVPGX2
Influenza A by PCR: NEGATIVE
Influenza B by PCR: NEGATIVE
Resp Syncytial Virus by PCR: NEGATIVE
SARS Coronavirus 2 by RT PCR: NEGATIVE

## 2024-04-03 LAB — MAGNESIUM: Magnesium: 2.9 mg/dL — ABNORMAL HIGH (ref 1.7–2.4)

## 2024-04-03 LAB — I-STAT CG4 LACTIC ACID, ED: Lactic Acid, Venous: 1.6 mmol/L (ref 0.5–1.9)

## 2024-04-03 MED ORDER — LORATADINE 10 MG PO TABS
10.0000 mg | ORAL_TABLET | Freq: Every day | ORAL | Status: DC
  Administered 2024-04-04 – 2024-04-07 (×4): 10 mg via ORAL
  Filled 2024-04-03 (×4): qty 1

## 2024-04-03 MED ORDER — HEPARIN SODIUM (PORCINE) 5000 UNIT/ML IJ SOLN: 5000.0000 [IU] | Freq: Three times a day (TID) | INTRAMUSCULAR | Status: DC

## 2024-04-03 MED ORDER — ONDANSETRON HCL 4 MG/2ML IJ SOLN: 4.0000 mg | Freq: Four times a day (QID) | INTRAMUSCULAR | Status: DC | PRN

## 2024-04-03 MED ORDER — DOXYCYCLINE HYCLATE 100 MG PO TABS
100.0000 mg | ORAL_TABLET | Freq: Once | ORAL | Status: AC
  Administered 2024-04-03: 100 mg via ORAL
  Filled 2024-04-03: qty 1

## 2024-04-03 MED ORDER — MIRTAZAPINE 30 MG PO TABS
30.0000 mg | ORAL_TABLET | Freq: Every evening | ORAL | Status: DC | PRN
  Administered 2024-04-07: 30 mg via ORAL
  Filled 2024-04-03: qty 1

## 2024-04-03 MED ORDER — BENZTROPINE MESYLATE 0.5 MG PO TABS
1.0000 mg | ORAL_TABLET | Freq: Two times a day (BID) | ORAL | Status: DC
  Administered 2024-04-03 – 2024-04-07 (×8): 1 mg via ORAL
  Filled 2024-04-03 (×8): qty 2

## 2024-04-03 MED ORDER — ALBUTEROL SULFATE (2.5 MG/3ML) 0.083% IN NEBU
2.5000 mg | INHALATION_SOLUTION | RESPIRATORY_TRACT | Status: DC | PRN
  Administered 2024-04-04 – 2024-04-07 (×2): 2.5 mg via RESPIRATORY_TRACT
  Filled 2024-04-03: qty 3

## 2024-04-03 MED ORDER — CARVEDILOL 3.125 MG PO TABS
6.2500 mg | ORAL_TABLET | Freq: Two times a day (BID) | ORAL | Status: DC
  Administered 2024-04-04 – 2024-04-07 (×7): 6.25 mg via ORAL
  Filled 2024-04-03 (×7): qty 2

## 2024-04-03 MED ORDER — FAMOTIDINE 20 MG PO TABS
40.0000 mg | ORAL_TABLET | Freq: Two times a day (BID) | ORAL | Status: DC
  Administered 2024-04-03 – 2024-04-07 (×8): 40 mg via ORAL
  Filled 2024-04-03 (×8): qty 2

## 2024-04-03 MED ORDER — ALBUTEROL SULFATE (2.5 MG/3ML) 0.083% IN NEBU
2.5000 mg | INHALATION_SOLUTION | RESPIRATORY_TRACT | Status: DC | PRN
  Administered 2024-04-03: 2.5 mg via RESPIRATORY_TRACT
  Filled 2024-04-03 (×2): qty 3

## 2024-04-03 MED ORDER — TAMSULOSIN HCL 0.4 MG PO CAPS
0.4000 mg | ORAL_CAPSULE | Freq: Every day | ORAL | Status: DC
  Administered 2024-04-04 – 2024-04-07 (×4): 0.4 mg via ORAL
  Filled 2024-04-03 (×4): qty 1

## 2024-04-03 MED ORDER — ACETAMINOPHEN 325 MG PO TABS: 650.0000 mg | ORAL_TABLET | Freq: Four times a day (QID) | ORAL | Status: DC | PRN

## 2024-04-03 MED ORDER — PREDNISONE 20 MG PO TABS
40.0000 mg | ORAL_TABLET | Freq: Every day | ORAL | Status: DC
  Administered 2024-04-04 – 2024-04-07 (×4): 40 mg via ORAL
  Filled 2024-04-03 (×4): qty 2

## 2024-04-03 MED ORDER — RISPERIDONE 2 MG PO TABS
2.0000 mg | ORAL_TABLET | Freq: Two times a day (BID) | ORAL | Status: DC
  Administered 2024-04-03 – 2024-04-07 (×8): 2 mg via ORAL
  Filled 2024-04-03 (×7): qty 1
  Filled 2024-04-03: qty 4
  Filled 2024-04-03: qty 1

## 2024-04-03 MED ORDER — APIXABAN 5 MG PO TABS
5.0000 mg | ORAL_TABLET | Freq: Two times a day (BID) | ORAL | Status: DC
  Administered 2024-04-03 – 2024-04-07 (×8): 5 mg via ORAL
  Filled 2024-04-03 (×8): qty 1

## 2024-04-03 MED ORDER — IPRATROPIUM-ALBUTEROL 0.5-2.5 (3) MG/3ML IN SOLN
3.0000 mL | Freq: Four times a day (QID) | RESPIRATORY_TRACT | Status: DC
  Administered 2024-04-04: 3 mL via RESPIRATORY_TRACT
  Filled 2024-04-03: qty 3

## 2024-04-03 MED ORDER — ATORVASTATIN CALCIUM 10 MG PO TABS
10.0000 mg | ORAL_TABLET | Freq: Every day | ORAL | Status: DC
  Administered 2024-04-03 – 2024-04-06 (×4): 10 mg via ORAL
  Filled 2024-04-03 (×4): qty 1

## 2024-04-03 MED ORDER — ALBUTEROL SULFATE (2.5 MG/3ML) 0.083% IN NEBU
10.0000 mg/h | INHALATION_SOLUTION | Freq: Once | RESPIRATORY_TRACT | Status: AC
  Administered 2024-04-03: 10 mg/h via RESPIRATORY_TRACT
  Filled 2024-04-03: qty 3

## 2024-04-03 MED ORDER — DOXYCYCLINE HYCLATE 100 MG PO TABS
100.0000 mg | ORAL_TABLET | Freq: Every day | ORAL | Status: AC
  Administered 2024-04-04 – 2024-04-07 (×4): 100 mg via ORAL
  Filled 2024-04-03 (×4): qty 1

## 2024-04-03 MED ORDER — ONDANSETRON HCL 4 MG PO TABS: 4.0000 mg | ORAL_TABLET | Freq: Four times a day (QID) | ORAL | Status: DC | PRN

## 2024-04-03 MED ORDER — IOHEXOL 350 MG/ML SOLN
75.0000 mL | Freq: Once | INTRAVENOUS | Status: AC | PRN
  Administered 2024-04-03: 75 mL via INTRAVENOUS

## 2024-04-03 MED ORDER — ACETAMINOPHEN 650 MG RE SUPP: 650.0000 mg | Freq: Four times a day (QID) | RECTAL | Status: DC | PRN

## 2024-04-03 MED ORDER — POLYETHYLENE GLYCOL 3350 17 G PO PACK
17.0000 g | PACK | Freq: Every day | ORAL | Status: DC
  Administered 2024-04-04 – 2024-04-06 (×3): 17 g via ORAL
  Filled 2024-04-03 (×4): qty 1

## 2024-04-03 MED ORDER — HYDROXYZINE HCL 25 MG PO TABS: 25.0000 mg | ORAL_TABLET | Freq: Three times a day (TID) | ORAL | Status: DC | PRN

## 2024-04-03 MED ORDER — SODIUM CHLORIDE 0.9 % IV SOLN: 100.0000 mg | INTRAVENOUS | Status: DC

## 2024-04-03 MED ORDER — PNEUMOCOCCAL 20-VAL CONJ VACC 0.5 ML IM SUSY
0.5000 mL | PREFILLED_SYRINGE | INTRAMUSCULAR | Status: DC
  Filled 2024-04-03: qty 0.5

## 2024-04-03 MED ORDER — IPRATROPIUM-ALBUTEROL 0.5-2.5 (3) MG/3ML IN SOLN
3.0000 mL | Freq: Once | RESPIRATORY_TRACT | Status: AC
  Administered 2024-04-03: 3 mL via RESPIRATORY_TRACT
  Filled 2024-04-03: qty 3

## 2024-04-03 NOTE — ED Triage Notes (Signed)
 Patient BIB EMS for [facility] started today.  [facility] patient (Lungs) had port placed on Monday w/bruising to chest noted.  Patient has cough.  Received mag, 2 nebs and solumedrol enroute.

## 2024-04-03 NOTE — Plan of Care (Signed)
 Pt arrived to unit from ED accompanied by his sister and care taker. Pt is alert and oriented x1 to self, with delayed responses. Sister confirms pt is hard of hearing, no hearing aids. POC reviewed with pt and sister. Tele monitor applied. Safety precautions reviewed pt pt and sister, bed alarm on and audible, bed in lowest position, call light within reach. Pt's sister will remain at bedside overnight.   Problem: Activity: Goal: Risk for activity intolerance will decrease Outcome: Progressing   Problem: Pain Managment: Goal: General experience of comfort will improve and/or be controlled Outcome: Progressing   Problem: Safety: Goal: Ability to remain free from injury will improve Outcome: Progressing   Problem: Skin Integrity: Goal: Risk for impaired skin integrity will decrease Outcome: Progressing

## 2024-04-03 NOTE — H&P (Signed)
 " History and Physical    Blake Mcknight FMW:[id number] DOB: 04/22/1955 DOA: 04/03/2024  PCP: [name], MD  Patient coming from: home  I have personally briefly reviewed patient's old medical records in [facility]  Chief Complaint: sob  HPI: Blake Mcknight is a 69 y.o. male with medical history significant of COPD with bullous emphysema on home O2 4l, HTN, PE on Eliquis , lung cancer , tobacco abuse , Schizophrenia who presents to BIB EMS with acute onset of sob x 1 days.  Patient in the field was noted to hypoxic per EMS on his 4L. Patient was treated with douneb x 2 ., magnesium  as well as solumedrol 125 in the field and transported to the ED. Per patient    ED Course:  Vitals: afeb 96.2, BP 149/68, hr 128  Sat 100%  on 10L  on arrival able to wean  to home 4L  ED:EKG:sinus rhythm ,poor tracing  Wbc :13, hgb 13.3 plt 285 D-dimer 1.63  NA 130 (137), K 4.2, CL 95, Co2 26, glu 167 , BUN 5  Mag 2.9 Trop < 15 BNP <50 Cxr IMPRESSION: 1. Increased patchy opacities at the right lung base. 2. Right chest Port-A-Cath with tip at the superior cavoatrial junction. 3. Small right pleural effusion. 4. Multiple bulla in the right lung base and right upper lobe.   Ctpe IMPRESSION: 1. No new pulmonary embolism is identified, with unchanged chronic right lower lobe pulmonary emboli at the segmental and subsegmental levels. 2. Severe emphysema with bullae in both lungs. 3. Unchanged masslike consolidation in the right lower lobe extending to the right hilum, measuring approximately 4.9 x 4.6 cm. 4. Unchanged subcarinal lymphadenopathy measuring 3.0 x 4.1 cm.  Vbg: 7.32/45  Lactic 1.6 Review of Systems: As per HPI otherwise 10 point review of systems negative.   Past Medical History:  Diagnosis Date   Abdominal pain 06/03/2013   Chest pain 08/10/2023   COPD (chronic obstructive pulmonary disease) (HCC)    with bullous emphysema.    COPD exacerbation (HCC)  08/09/2023   Heavy cigarette smoker before 2003   pt claims only 10 cigs per day, never heavier amounts.    HLD (hyperlipidemia)    Hypertension    Schizophrenia (HCC) 06/28/2013   This is a chronic condition and he lives with family    Past Surgical History:  Procedure Laterality Date   CHOLECYSTECTOMY N/A 06/06/2013   Procedure: LAPAROSCOPIC CHOLECYSTECTOMY WITH ATTEMPTED INTRAOPERATIVE CHOLANGIOGRAM;  Surgeon: Dann FORBES Hummer, MD;  Location: [facility] OR;  Service: General;  Laterality: N/A;   ERCP N/A 06/03/2013   Procedure: ENDOSCOPIC RETROGRADE CHOLANGIOPANCREATOGRAPHY (ERCP);  Surgeon: Gwendlyn ONEIDA Buddy, MD;  Location: [facility] ENDOSCOPY;  Service: Endoscopy;  Laterality: N/A;   IR IMAGING GUIDED PORT INSERTION  03/31/2024     reports that he has quit smoking. His smoking use included cigarettes. He has a 41 pack-year smoking history. He has never used smokeless tobacco. He reports that he does not drink alcohol and does not use drugs.  Allergies[1]  Family History  Problem Relation Age of Onset   Diabetes Mellitus II Mother    Diabetes Mother    Heart disease Father    Stroke Maternal Aunt    CAD Neg Hx    *** Prior to Admission medications  Medication Sig Start Date End Date Taking? Authorizing Provider  acetaminophen  (TYLENOL ) 500 MG tablet Take 2 tablets (1,000 mg total) by mouth every 8 (eight) hours. 08/14/23   Fausto Burnard LABOR,  DO  albuterol  (PROVENTIL ) (2.5 MG/3ML) 0.083% nebulizer solution Take 2.5 mg by nebulization every 6 (six) hours as needed. 01/06/24   [provider]  albuterol  (VENTOLIN  HFA) 108 (90 Base) MCG/ACT inhaler Inhale 2 puffs into the lungs every 4 (four) hours as needed for wheezing or shortness of breath. 10/02/22   Bernard Drivers, MD  apixaban  (ELIQUIS ) 5 MG TABS tablet Take 1 tablet (5 mg total) by mouth 2 (two) times daily. 10/22/23   Shelah Lamar RAMAN, MD  Artificial Tear Ointment (DRY EYES OP) Place 1 drop into both eyes 2 (two) times daily as needed  (itichy eyes).    [provider]  atorvastatin  (LIPITOR) 10 MG tablet Take 1 tablet (10 mg total) by mouth at bedtime. 08/14/23   Fausto Burnard LABOR, DO  benztropine  (COGENTIN ) 1 MG tablet Take 1 tablet (1 mg total) by mouth 2 (two) times daily. 02/03/24   Arfeen, Leni DASEN, MD  carvedilol  (COREG ) 3.125 MG tablet Take 2 tablets (6.25 mg total) by mouth 2 (two) times daily with a meal. 09/10/23   Will Almarie MATSU, MD  cetirizine (ZYRTEC) 10 MG tablet Take 10 mg by mouth daily as needed for allergies or rhinitis.    [provider]  cholecalciferol  (VITAMIN D ) 25 MCG (1000 UNIT) tablet Take 2,000-4,000 Units by mouth every morning.    [provider]  cyanocobalamin  1000 MCG tablet Take 1,000 mcg by mouth daily. Patient taking differently: Take 1,000 mcg by mouth as needed.    [provider]  famotidine  (PEPCID ) 40 MG tablet Take 40 mg by mouth 2 (two) times daily. 12/28/18   [provider]  feeding supplement, GLUCERNA SHAKE, (GLUCERNA SHAKE) LIQD Take 237 mLs by mouth 3 (three) times daily between meals. Patient not taking: Reported on 02/16/2024 08/14/23   Fausto Burnard A, DO  Fluticasone -Umeclidin-Vilant (TRELEGY ELLIPTA ) 100-62.5-25 MCG/ACT AEPB Inhale 1 puff into the lungs daily. 01/01/24   Parrett, Madelin RAMAN, NP  guaiFENesin  (MUCINEX ) 600 MG 12 hr tablet Take 1 tablet (600 mg total) by mouth 2 (two) times daily as needed. 02/07/24   Amin, Sumayya, MD  hydrOXYzine  (ATARAX ) 25 MG tablet Take 1 tablet (25 mg total) by mouth 3 (three) times daily as needed for anxiety. 10/12/23   Sherrill Cable Latif, DO  ipratropium-albuterol  (DUONEB) 0.5-2.5 (3) MG/3ML SOLN Take 3 mLs by nebulization every 4 (four) hours. Patient taking differently: Take 3 mLs by nebulization every 4 (four) hours as needed. 08/14/23   Fausto Burnard A, DO  JANUVIA 100 MG tablet Take 100 mg by mouth every evening. 01/30/23   [provider]  lidocaine  (LIDODERM ) 5 % Place 1 patch  onto the skin daily. Remove & Discard patch within 12 hours or as directed by MD Patient taking differently: Place 1 patch onto the skin daily as needed (pain). Remove & Discard patch within 12 hours or as directed by MD 09/10/23   Will Almarie MATSU, MD  lidocaine -prilocaine  (EMLA ) cream Apply to affected area once 03/21/24   Sherrod Sherrod, MD  methocarbamol  (ROBAXIN ) 750 MG tablet Take 1 tablet (750 mg total) by mouth every 8 (eight) hours as needed for muscle spasms. 09/10/23   Will Almarie MATSU, MD  mirtazapine  (REMERON ) 30 MG tablet Take 1 tablet (30 mg total) by mouth at bedtime as needed (insomnia). 02/03/24 02/02/25  Arfeen, Leni DASEN, MD  multivitamin (ONE-A-DAY MEN'S) TABS tablet Take 1 tablet by mouth daily.    [provider]  ondansetron  (ZOFRAN ) 8 MG tablet  Take 1 tablet (8 mg total) by mouth every 8 (eight) hours as needed for nausea or vomiting. Start on the third day after carboplatin. 03/21/24   Sherrod Sherrod, MD  OXYGEN  Inhale 2 L/min into the lungs continuous.     [provider]  polyethylene glycol (MIRALAX  / GLYCOLAX ) 17 g packet Take 17 g by mouth daily. 09/10/23   Will Almarie MATSU, MD  predniSONE  (DELTASONE ) 20 MG tablet Take 40 mg by mouth daily for 3 days, then 30 mg by mouth daily for 3 days, then go back on your usual maintenance regimen of prednisone  20 mg by mouth daily. Patient taking differently: Take 20 mg by mouth daily with breakfast. For congestion 11/29/23   Ghimire, Donalda HERO, MD  prochlorperazine  (COMPAZINE ) 10 MG tablet Take 1 tablet (10 mg total) by mouth every 6 (six) hours as needed for nausea or vomiting. 03/21/24   Sherrod Sherrod, MD  risperiDONE  (RISPERDAL ) 2 MG tablet Take 1 tablet (2 mg total) by mouth 2 (two) times daily. 02/03/24   Arfeen, Leni DASEN, MD  sodium chloride  (OCEAN) 0.65 % SOLN nasal spray Place 1 spray into both nostrils as needed for congestion.    [provider]  tamsulosin  (FLOMAX ) 0.4 MG CAPS capsule Take  0.4 mg by mouth in the morning and at bedtime. 07/23/20   [provider]  traMADol  (ULTRAM ) 50 MG tablet Take 1 tablet (50 mg total) by mouth every 6 (six) hours as needed. 10/21/23   Shelah Lamar RAMAN, MD    Physical Exam: Vitals:   04/03/24 1930 04/03/24 1945 04/03/24 2000 04/03/24 2015  BP: 99/80 111/76 105/75 113/89  Pulse: (!) 102 98    Resp: 17 16 17 17   Temp:      TempSrc:      SpO2: 100% 100%      Constitutional: NAD, calm, comfortable Vitals:   04/03/24 1930 04/03/24 1945 04/03/24 2000 04/03/24 2015  BP: 99/80 111/76 105/75 113/89  Pulse: (!) 102 98    Resp: 17 16 17 17   Temp:      TempSrc:      SpO2: 100% 100%     Eyes: PERRL, lids and conjunctivae normal ENMT: Mucous membranes are moist. Posterior pharynx clear of any exudate or lesions.Normal dentition.  Neck: normal, supple, no masses, no thyromegaly Respiratory: clear to auscultation bilaterally, no wheezing, no crackles. Normal respiratory effort. No accessory muscle use.  Cardiovascular: Regular rate and rhythm, no murmurs / rubs / gallops. No extremity edema. 2+ pedal pulses. No carotid bruits.  Abdomen: no tenderness, no masses palpated. No hepatosplenomegaly. Bowel sounds positive.  Musculoskeletal: no clubbing / cyanosis. No joint deformity upper and lower extremities. Good ROM, no contractures. Normal muscle tone.  Skin: no rashes, lesions, ulcers. No induration Neurologic: CN 2-12 grossly intact. Sensation intact, DTR normal. Strength 5/5 in all 4.  Psychiatric: Normal judgment and insight. Alert and oriented x 3. Normal mood.    Labs on Admission: I have personally reviewed following labs and imaging studies  CBC: Recent Labs  Lab 04/03/24 1539  WBC 13.0*  NEUTROABS 9.4*  HGB 13.3  HCT 40.5  MCV 81.2  PLT 285   Basic Metabolic Panel: Recent Labs  Lab 04/03/24 1539  NA 130*  K 4.2  CL 95*  CO2 26  GLUCOSE 167*  BUN 5*  CREATININE 0.62  CALCIUM  9.3  MG 2.9*   GFR: Estimated  Creatinine Clearance: 81.6 mL/min (by C-G formula based on SCr of 0.62 mg/dL). Liver  Function Tests: Recent Labs  Lab 04/03/24 1539  AST 18  ALT 11  ALKPHOS 87  BILITOT 0.5  PROT 7.4  ALBUMIN 3.8   No results for input(s): LIPASE, AMYLASE in the last 168 hours. No results for input(s): AMMONIA in the last 168 hours. Coagulation Profile: Recent Labs  Lab 04/03/24 1539  INR 0.7*   Cardiac Enzymes: No results for input(s): CKTOTAL, CKMB, CKMBINDEX, TROPONINI in the last 168 hours. BNP (last 3 results) Recent Labs    04/03/24 1539  PROBNP <50.0   HbA1C: No results for input(s): HGBA1C in the last 72 hours. CBG: Recent Labs  Lab 03/31/24 0953  GLUCAP 84   Lipid Profile: No results for input(s): CHOL, HDL, LDLCALC, TRIG, CHOLHDL, LDLDIRECT in the last 72 hours. Thyroid Function Tests: No results for input(s): TSH, T4TOTAL, FREET4, T3FREE, THYROIDAB in the last 72 hours. Anemia Panel: No results for input(s): VITAMINB12, FOLATE, FERRITIN, TIBC, IRON, RETICCTPCT in the last 72 hours. Urine analysis:    Component Value Date/Time   COLORURINE YELLOW 04/16/2023 0616   APPEARANCEUR CLEAR 04/16/2023 0616   LABSPEC 1.016 04/16/2023 0616   PHURINE 6.0 04/16/2023 0616   GLUCOSEU 150 (A) 04/16/2023 0616   HGBUR NEGATIVE 04/16/2023 0616   BILIRUBINUR NEGATIVE 04/16/2023 0616   KETONESUR NEGATIVE 04/16/2023 0616   PROTEINUR NEGATIVE 04/16/2023 0616   UROBILINOGEN 0.2 01/29/2019 1634   NITRITE NEGATIVE 04/16/2023 0616   LEUKOCYTESUR NEGATIVE 04/16/2023 0616    Radiological Exams on Admission: CT Angio Chest PE W and/or Wo Contrast Result Date: 04/03/2024 EXAM: CTA CHEST 04/03/2024 04:43:05 PM TECHNIQUE: CTA of the chest was performed after the administration of intravenous contrast. Multiplanar reformatted images are provided for review. MIP images are provided for review. Automated exposure control, iterative reconstruction,  and/or weight based adjustment of the mA/kV was utilized to reduce the radiation dose to as low as reasonably achievable. COMPARISON: CT angiogram chest 02/06/2024. CLINICAL HISTORY: Pulmonary embolism (PE) suspected, low to intermediate prob, neg D-dimer. FINDINGS: PULMONARY ARTERIES: Pulmonary arteries are adequately opacified for evaluation. Chronic appearing right lower lobe pulmonary emboli appear unchanged at the segmental and subsegmental levels. No new pulmonary embolism identified. Main pulmonary artery is normal in caliber. MEDIASTINUM: Coronary and aortic atherosclerotic calcifications are noted. Right chest port in place with distal catheter ending in the distal SVC. The heart and pericardium demonstrate no acute abnormality. There is no acute abnormality of the thoracic aorta. LYMPH NODES: Subcarinal lymphadenopathy measuring 3.0 x 4.1 cm, unchanged. No hilar or axillary lymphadenopathy. LUNGS AND PLEURA: Severe emphysema and bullae are again noted in both lungs. Masslike consolidation in the right lower lobe extending to the right hilum measuring approximately 4.9 x 4.6 cm that is not significantly changed. There is plugging/cutoff of more peripheral right lower lobe bronchi in the region of the masslike consolidation similar to prior. Nodular density in the left upper lobe triangular in shape measuring up to 9 mm on image 12/97 appears unchanged. Mucous plugging throughout the left lower lobe with some ill defined nodular densities appears unchanged. There is a trace right pleural effusion, unchanged. No pneumothorax. No new focal lung infiltrate. UPPER ABDOMEN: The gallbladder surgically absent. Hypodense lesion in the inferior right lobe of the liver measures 2.7 cm and was incompletely imaged on the prior study. Left renal cysts are again seen. SOFT TISSUES AND BONES: Disc deformities of T6, T7 and T8 appear unchanged. No acute soft tissue abnormality. IMPRESSION: 1. No new pulmonary embolism is  identified, with unchanged chronic  right lower lobe pulmonary emboli at the segmental and subsegmental levels. 2. Severe emphysema with bullae in both lungs. 3. Unchanged masslike consolidation in the right lower lobe extending to the right hilum, measuring approximately 4.9 x 4.6 cm. 4. Unchanged subcarinal lymphadenopathy measuring 3.0 x 4.1 cm. Electronically signed by: Greig Pique MD 04/03/2024 05:51 PM EST RP Workstation: [id number]   DG Chest Port 1 View Result Date: 04/03/2024 EXAM: 2 VIEW(S) XRAY OF THE CHEST 04/03/2024 03:50:00 PM COMPARISON: 11/27/2023 CLINICAL HISTORY: sob, recent R-sided port placement FINDINGS: LINES, TUBES AND DEVICES: Right chest Port-A-Cath in place with tip projecting over the superior cavoatrial junction. LUNGS AND PLEURA: Small right pleural effusion. Increased patchy opacities at the right lung base. Multiple bulla in the right lung base and right upper lobe. No pneumothorax. HEART AND MEDIASTINUM: No acute abnormality of the cardiac and mediastinal silhouettes. BONES AND SOFT TISSUES: No acute osseous abnormality. IMPRESSION: 1. Increased patchy opacities at the right lung base. 2. Right chest Port-A-Cath with tip at the superior cavoatrial junction. 3. Small right pleural effusion. 4. Multiple bulla in the right lung base and right upper lobe. Electronically signed by: Greig Pique MD 04/03/2024 04:34 PM EST RP Workstation: [id number]    EKG: Independently reviewed.  Assessment/Plan  Acute COPD exacerbation  -admit to med tele  - start prednisone  40 mg po daily  - continue on doxycycline    -f/u on sputum cultures  - nebs standing and prn  -resume chronic inhalers  -pulmonary toilet   Hx of stage IV non-small cell lung cancer with  liver mets -followed by oncology on palliative chemo   PE  -on Eliquis     HTN  -continue on coreg    Tobacco abuse   Schizophrenia Continue risperidone /Cogentin /mirtazapine  , prn atarax   BPH  -continue flomax   DVT  prophylaxis:  Eliquis  Code Status: full/ as discussed per patient wishes in event of cardiac arrest  Family Communication:  Disposition Plan: patient  expected to be admitted greater than 2 midnights  Consults called: n/a Admission status:  med tele   Camila DELENA Ned MD [facility]  If 7PM-7AM, please contact night-coverage [email or web address] Password [email or web address]  04/03/2024, 8:22 PM       [1] No Known Allergies  "

## 2024-04-03 NOTE — ED Provider Notes (Signed)
 " [place] EMERGENCY DEPARTMENT AT [facility] Provider Note   CSN: [id number] Arrival date & time: 04/03/24  1459     History Chief Complaint  Patient presents with   Shortness of Breath    HPI: Blake Mcknight is a 69 y.o. male with history pertinent forstage IV non-small cell lung cancer with hepatic metastasis, with recent port placement in his right sided chest wall on 1/15, HTN, HLD, COPD on 4 L, schizophrenia, T2DM, BPH on who presents complaining of shortness of breath. Patient arrived via EMS from home.  History provided by patient and EMS.  No interpreter required during this encounter.  Patient reports that he was at his baseline after receiving his port, however reports that he began feeling short of breath beginning today, felt like he was wheezy, difficulty with air movement, therefore he called EMS.  Also reports that he has had subjective fevers and chills today.  Denies any chest pain, nausea, vomiting, diarrhea, abdominal pain.  Patient received Solu-Medrol , magnesium  2 DuoNebs and route with EMS.  Patient's recorded medical, surgical, social, medication list and allergies were reviewed in the Snapshot window as part of the initial history.   Prior to Admission medications  Medication Sig Start Date End Date Taking? Authorizing Provider  acetaminophen  (TYLENOL ) 500 MG tablet Take 2 tablets (1,000 mg total) by mouth every 8 (eight) hours. 08/14/23   Fausto Burnard LABOR, DO  albuterol  (PROVENTIL ) (2.5 MG/3ML) 0.083% nebulizer solution Take 2.5 mg by nebulization every 6 (six) hours as needed. 01/06/24   [provider]  albuterol  (VENTOLIN  HFA) 108 (90 Base) MCG/ACT inhaler Inhale 2 puffs into the lungs every 4 (four) hours as needed for wheezing or shortness of breath. 10/02/22   Bernard Drivers, MD  apixaban  (ELIQUIS ) 5 MG TABS tablet Take 1 tablet (5 mg total) by mouth 2 (two) times daily. 10/22/23   Shelah Lamar RAMAN, MD  Artificial Tear Ointment (DRY EYES  OP) Place 1 drop into both eyes 2 (two) times daily as needed (itichy eyes).    [provider]  atorvastatin  (LIPITOR) 10 MG tablet Take 1 tablet (10 mg total) by mouth at bedtime. 08/14/23   Fausto Burnard A, DO  benztropine  (COGENTIN ) 1 MG tablet Take 1 tablet (1 mg total) by mouth 2 (two) times daily. 02/03/24   Arfeen, Leni DASEN, MD  carvedilol  (COREG ) 3.125 MG tablet Take 2 tablets (6.25 mg total) by mouth 2 (two) times daily with a meal. 09/10/23   Will Almarie MATSU, MD  cetirizine (ZYRTEC) 10 MG tablet Take 10 mg by mouth daily as needed for allergies or rhinitis.    [provider]  cholecalciferol  (VITAMIN D ) 25 MCG (1000 UNIT) tablet Take 2,000-4,000 Units by mouth every morning.    [provider]  cyanocobalamin  1000 MCG tablet Take 1,000 mcg by mouth daily. Patient taking differently: Take 1,000 mcg by mouth as needed.    [provider]  famotidine  (PEPCID ) 40 MG tablet Take 40 mg by mouth 2 (two) times daily. 12/28/18   [provider]  feeding supplement, GLUCERNA SHAKE, (GLUCERNA SHAKE) LIQD Take 237 mLs by mouth 3 (three) times daily between meals. Patient not taking: Reported on 02/16/2024 08/14/23   Fausto Burnard A, DO  Fluticasone -Umeclidin-Vilant (TRELEGY ELLIPTA ) 100-62.5-25 MCG/ACT AEPB Inhale 1 puff into the lungs daily. 01/01/24   Parrett, Madelin RAMAN, NP  guaiFENesin  (MUCINEX ) 600 MG 12 hr tablet Take 1 tablet (600 mg total) by mouth 2 (two) times daily as  needed. 02/07/24   Amin, Sumayya, MD  hydrOXYzine  (ATARAX ) 25 MG tablet Take 1 tablet (25 mg total) by mouth 3 (three) times daily as needed for anxiety. 10/12/23   Sherrill Cable Latif, DO  ipratropium-albuterol  (DUONEB) 0.5-2.5 (3) MG/3ML SOLN Take 3 mLs by nebulization every 4 (four) hours. Patient taking differently: Take 3 mLs by nebulization every 4 (four) hours as needed. 08/14/23   Fausto Sor A, DO  JANUVIA 100 MG tablet Take 100 mg by mouth every evening. 01/30/23    [provider]  lidocaine  (LIDODERM ) 5 % Place 1 patch onto the skin daily. Remove & Discard patch within 12 hours or as directed by MD Patient taking differently: Place 1 patch onto the skin daily as needed (pain). Remove & Discard patch within 12 hours or as directed by MD 09/10/23   Will Almarie MATSU, MD  lidocaine -prilocaine  (EMLA ) cream Apply to affected area once 03/21/24   Sherrod Sherrod, MD  methocarbamol  (ROBAXIN ) 750 MG tablet Take 1 tablet (750 mg total) by mouth every 8 (eight) hours as needed for muscle spasms. 09/10/23   Will Almarie MATSU, MD  mirtazapine  (REMERON ) 30 MG tablet Take 1 tablet (30 mg total) by mouth at bedtime as needed (insomnia). 02/03/24 02/02/25  Arfeen, Leni DASEN, MD  multivitamin (ONE-A-DAY MEN'S) TABS tablet Take 1 tablet by mouth daily.    [provider]  ondansetron  (ZOFRAN ) 8 MG tablet Take 1 tablet (8 mg total) by mouth every 8 (eight) hours as needed for nausea or vomiting. Start on the third day after carboplatin. 03/21/24   Sherrod Sherrod, MD  OXYGEN  Inhale 2 L/min into the lungs continuous.     [provider]  polyethylene glycol (MIRALAX  / GLYCOLAX ) 17 g packet Take 17 g by mouth daily. 09/10/23   Will Almarie MATSU, MD  predniSONE  (DELTASONE ) 20 MG tablet Take 40 mg by mouth daily for 3 days, then 30 mg by mouth daily for 3 days, then go back on your usual maintenance regimen of prednisone  20 mg by mouth daily. Patient taking differently: Take 20 mg by mouth daily with breakfast. For congestion 11/29/23   Ghimire, Donalda HERO, MD  prochlorperazine  (COMPAZINE ) 10 MG tablet Take 1 tablet (10 mg total) by mouth every 6 (six) hours as needed for nausea or vomiting. 03/21/24   Sherrod Sherrod, MD  risperiDONE  (RISPERDAL ) 2 MG tablet Take 1 tablet (2 mg total) by mouth 2 (two) times daily. 02/03/24   Arfeen, Leni DASEN, MD  sodium chloride  (OCEAN) 0.65 % SOLN nasal spray Place 1 spray into both nostrils as needed for congestion.     [provider]  tamsulosin  (FLOMAX ) 0.4 MG CAPS capsule Take 0.4 mg by mouth in the morning and at bedtime. 07/23/20   [provider]  traMADol  (ULTRAM ) 50 MG tablet Take 1 tablet (50 mg total) by mouth every 6 (six) hours as needed. 10/21/23   Shelah Lamar RAMAN, MD     Allergies: Patient has no known allergies.   Review of Systems   ROS as per HPI  Physical Exam Updated Vital Signs There were no vitals taken for this visit. Physical Exam Vitals and nursing note reviewed.  Constitutional:      General: He is not in acute distress.    Appearance: He is well-developed.  HENT:     Head: Normocephalic and atraumatic.  Eyes:     Conjunctiva/sclera: Conjunctivae normal.  Cardiovascular:     Rate and Rhythm: Regular rhythm. Tachycardia present.  Heart sounds: No murmur heard. Pulmonary:     Effort: Tachypnea, accessory muscle usage and respiratory distress present.     Breath sounds: Decreased breath sounds (Diffusely diminished) and wheezing (Diffusely inspiratory and expiratory wheezes) present.  Abdominal:     Palpations: Abdomen is soft.     Tenderness: There is no abdominal tenderness.  Musculoskeletal:        General: No swelling.     Cervical back: Neck supple.  Skin:    General: Skin is warm and dry.     Capillary Refill: Capillary refill takes less than 2 seconds.  Neurological:     Mental Status: He is alert.  Psychiatric:        Mood and Affect: Mood normal.     ED Course/ Medical Decision Making/ A&P    Procedures Procedures   Medications Ordered in ED Medications - No data to display  Medical Decision Making:   BURDETT PINZON is a 69 y.o. male who presents for shortness of breath as per above.  Physical exam is pertinent for tachycardia.   The differential includes but is not limited to tachypnea, intercostal retractions, diffusely diminished breath sounds and wheezes.  Independent historian: EMS  External data reviewed: Notes:  Reviewed recent procedure note on 1/15  Initial Plan:  Screening labs including CBC and Metabolic panel to evaluate for infectious or metabolic etiology of disease.  Screening magnesium  given patient received magnesium  Screening lactic, coags, blood cultures given possible sepsis in the setting of multiple vital sign abnormalities (increased oxygen  requirement, tachycardia) Screening D-dimer given shortness of breath, cancer patient VBG to assess for hypercarbia Chest x-ray to evaluate for structural/infectious intra-thoracic pathology.  EKG/Troponin testing/BNP testing to evaluate for cardiac pathology. Objective evaluation as below reviewed   Labs: Ordered, Independent interpretation, and Details: ***  Radiology: Ordered, Independent interpretation, Details: ***, and All images reviewed independently. ***Agree with radiology report at this time.   IR IMAGING GUIDED PORT INSERTION Result Date: 03/31/2024 INDICATION: 69 year old male with history of advanced stage lung cancer requiring central venous access for chemotherapy administration. EXAM: IMPLANTED PORT A CATH PLACEMENT WITH ULTRASOUND AND FLUOROSCOPIC GUIDANCE COMPARISON:  None Available. MEDICATIONS: None ANESTHESIA/SEDATION: Moderate (conscious) sedation was employed during this procedure. A total of Versed  2 mg and Fentanyl  100 mcg was administered intravenously. Moderate Sedation Time: 17 minutes. The patient's level of consciousness and vital signs were monitored continuously by radiology nursing throughout the procedure under my direct supervision. CONTRAST:  None FLUOROSCOPY TIME:  One mGy reference air kerma COMPLICATIONS: None immediate. PROCEDURE: The procedure, risks, benefits, and alternatives were explained to the patient. Questions regarding the procedure were encouraged and answered. The patient understands and consents to the procedure. The right neck and chest were prepped with chlorhexidine  in a sterile fashion, and a  sterile drape was applied covering the operative field. Maximum barrier sterile technique with sterile gowns and gloves were used for the procedure. A timeout was performed prior to the initiation of the procedure. Ultrasound was used to examine the jugular vein which was compressible and free of internal echoes. A skin marker was used to demarcate the planned venotomy and port pocket incision sites. Local anesthesia was provided to these sites and the subcutaneous tunnel track with 1% lidocaine  with 1:100,000 epinephrine . A small incision was created at the jugular access site and blunt dissection was performed of the subcutaneous tissues. Under ultrasound guidance, the jugular vein was accessed with a 21 ga micropuncture needle and an 0.018 wire was inserted  to the superior vena cava. Real-time ultrasound guidance was utilized for vascular access including the acquisition of a permanent ultrasound image documenting patency of the accessed vessel. A 5 Fr micopuncture set was then used, through which a 0.035 Rosen wire was passed under fluoroscopic guidance into the inferior vena cava. An 8 Fr dilator was then placed over the wire. A subcutaneous port pocket was then created along the upper chest wall utilizing a combination of sharp and blunt dissection. The pocket was irrigated with sterile saline, packed with gauze, and observed for hemorrhage. A single lumen clear view power injectable port was chosen for placement. The 8 Fr catheter was tunneled from the port pocket site to the venotomy incision. The port was placed in the pocket. The external catheter was trimmed to appropriate length. The dilator was exchanged for an 8 Fr peel-away sheath under fluoroscopic guidance. The catheter was then placed through the sheath and the sheath was removed. Final catheter positioning was confirmed and documented with a fluoroscopic spot radiograph. The port was accessed with a Huber needle, aspirated, and flushed with  heparinized saline. The deep dermal layer of the port pocket incision was closed with interrupted 3-0 Vicryl suture. Dermabond was then placed over the port pocket and neck incisions. The patient tolerated the procedure well without immediate post procedural complication. FINDINGS: After catheter placement, the tip lies within the superior cavoatrial junction. The catheter aspirates and flushes normally and is ready for immediate use. IMPRESSION: Successful placement of a power injectable Port-A-Cath via the right internal jugular vein. The catheter is ready for immediate use. Ester Sides, MD [facility] Radiology Electronically Signed   By: Ester Sides M.D.   On: 03/31/2024 12:36    EKG/Medicine tests: Ordered and Independent interpretation EKG Interpretation:                   Interventions: Nebs, continuous albuterol   See the EMR for full details regarding lab and imaging results.  Presents to the emergency department for shortness of breath, patient with tachypnea, creased work of breathing, tachycardia, wheezing and decreased air movement.  Multiple pathologies be causing and/or contributing to patient's presentation given his complex medical history including COPD, cancer.  Do feel that patient warrants broad workup, given wheezing, will administer third DuoNeb followed by continuous albuterol  given severity of patient's wheezing and increased work of breathing.  Broad lab workup also indicated as per initial plan above.  {LSCOPA:33420}  Discussion of management or test interpretations with external provider(s): ***  Risk Drugs:{LSDRUGS:33399} Treatment: {LSTREATMENT:33409} Surgery:{LSSURGERY:33410} Critical Care: ***  Disposition: {LSDISPO:33388}  MDM generated using voice dictation software and may contain dictation errors.  Please contact me for any clarification or with any questions.  Clinical Impression: No diagnosis found.    Data Unavailable   Final Clinical Impression(s) / ED Diagnoses Final diagnoses:  None    Rx / DC Orders ED Discharge Orders     None      "

## 2024-04-04 ENCOUNTER — Inpatient Hospital Stay

## 2024-04-04 ENCOUNTER — Inpatient Hospital Stay: Admitting: Physician Assistant

## 2024-04-04 ENCOUNTER — Other Ambulatory Visit: Payer: Self-pay | Admitting: Physician Assistant

## 2024-04-04 DIAGNOSIS — C3431 Malignant neoplasm of lower lobe, right bronchus or lung: Secondary | ICD-10-CM

## 2024-04-04 DIAGNOSIS — J441 Chronic obstructive pulmonary disease with (acute) exacerbation: Secondary | ICD-10-CM | POA: Diagnosis not present

## 2024-04-04 LAB — CBC
HCT: 38.6 % — ABNORMAL LOW (ref 39.0–52.0)
HCT: 36.7 % — ABNORMAL LOW (ref 39.0–52.0)
Hemoglobin: 12.8 g/dL — ABNORMAL LOW (ref 13.0–17.0)
Hemoglobin: 11.9 g/dL — ABNORMAL LOW (ref 13.0–17.0)
MCH: 26.8 pg (ref 26.0–34.0)
MCH: 25.9 pg — ABNORMAL LOW (ref 26.0–34.0)
MCHC: 33.2 g/dL (ref 30.0–36.0)
MCHC: 32.4 g/dL (ref 30.0–36.0)
MCV: 80.9 fL (ref 80.0–100.0)
MCV: 79.8 fL — ABNORMAL LOW (ref 80.0–100.0)
Platelets: 257 K/uL (ref 150–400)
Platelets: 248 K/uL (ref 150–400)
RBC: 4.77 MIL/uL (ref 4.22–5.81)
RBC: 4.6 MIL/uL (ref 4.22–5.81)
RDW: 15.5 % (ref 11.5–15.5)
RDW: 15.3 % (ref 11.5–15.5)
WBC: 9.5 K/uL (ref 4.0–10.5)
WBC: 8.9 K/uL (ref 4.0–10.5)
nRBC: 0 % (ref 0.0–0.2)
nRBC: 0 % (ref 0.0–0.2)

## 2024-04-04 LAB — COMPREHENSIVE METABOLIC PANEL WITH GFR
ALT: 7 U/L (ref 0–44)
AST: 10 U/L — ABNORMAL LOW (ref 15–41)
Albumin: 3.5 g/dL (ref 3.5–5.0)
Alkaline Phosphatase: 79 U/L (ref 38–126)
Anion gap: 9 (ref 5–15)
BUN: 6 mg/dL — ABNORMAL LOW (ref 8–23)
CO2: 26 mmol/L (ref 22–32)
Calcium: 9 mg/dL (ref 8.9–10.3)
Chloride: 99 mmol/L (ref 98–111)
Creatinine, Ser: 0.58 mg/dL — ABNORMAL LOW (ref 0.61–1.24)
GFR, Estimated: >60 mL/min
Glucose, Bld: 102 mg/dL — ABNORMAL HIGH (ref 70–99)
Potassium: 4 mmol/L (ref 3.5–5.1)
Sodium: 133 mmol/L — ABNORMAL LOW (ref 135–145)
Total Bilirubin: 0.5 mg/dL (ref 0.0–1.2)
Total Protein: 6.7 g/dL (ref 6.5–8.1)

## 2024-04-04 LAB — RESPIRATORY PANEL BY PCR

## 2024-04-04 LAB — EXPECTORATED SPUTUM ASSESSMENT W GRAM STAIN, RFLX TO RESP C

## 2024-04-04 MED ORDER — ARFORMOTEROL TARTRATE 15 MCG/2ML IN NEBU
15.0000 ug | INHALATION_SOLUTION | Freq: Two times a day (BID) | RESPIRATORY_TRACT | Status: DC
  Administered 2024-04-04 – 2024-04-07 (×7): 15 ug via RESPIRATORY_TRACT
  Filled 2024-04-04 (×7): qty 2

## 2024-04-04 MED ORDER — IPRATROPIUM-ALBUTEROL 0.5-2.5 (3) MG/3ML IN SOLN
3.0000 mL | Freq: Four times a day (QID) | RESPIRATORY_TRACT | Status: DC
  Administered 2024-04-05 (×4): 3 mL via RESPIRATORY_TRACT
  Filled 2024-04-04 (×4): qty 3

## 2024-04-04 MED ORDER — IPRATROPIUM-ALBUTEROL 0.5-2.5 (3) MG/3ML IN SOLN
3.0000 mL | RESPIRATORY_TRACT | Status: DC
  Administered 2024-04-04 (×4): 3 mL via RESPIRATORY_TRACT
  Filled 2024-04-04 (×4): qty 3

## 2024-04-04 MED ORDER — BUDESONIDE 0.25 MG/2ML IN SUSP
0.2500 mg | Freq: Two times a day (BID) | RESPIRATORY_TRACT | Status: DC
  Administered 2024-04-04 – 2024-04-07 (×7): 0.25 mg via RESPIRATORY_TRACT
  Filled 2024-04-04 (×7): qty 2

## 2024-04-04 MED ORDER — AMOXICILLIN-POT CLAVULANATE 875-125 MG PO TABS
1.0000 | ORAL_TABLET | Freq: Two times a day (BID) | ORAL | Status: AC
  Administered 2024-04-04 – 2024-04-06 (×6): 1 via ORAL
  Filled 2024-04-04 (×7): qty 1

## 2024-04-04 NOTE — Progress Notes (Signed)
 " PROGRESS NOTE  Blake Mcknight FMW:[id number] DOB: January 08, 1956 DOA: 04/03/2024 PCP: [name], MD   LOS: 1 day   Brief Narrative / Interim history: 69 year old male with history of COPD on home oxygen  2 L, HTN, prior PE on Eliquis , recently diagnosed lung cancer, tobacco use in remission, schizophrenia comes into the hospital with shortness of breath.  Patient's sister tells me that he had the port for chemotherapy placed last week, and ever since then he has been having fatigue, weakness, has been having increased shortness of breath, has been ambulating less and refusing food.  He has also been having a cough but there is a chronic component to his cough.  She reports subjective fevers  Subjective / 24h Interval events: Patient denies any specific symptoms, but is breathing fast on my evaluation.  Sister is at bedside  Assesement and Plan: Principal problem COPD exacerbation, chronic hypoxic respiratory failure -patient found to have wheezing on admission, admitted and started for treatment of COPD exacerbation with steroids, nebulizers. - Remains tachypneic today, add Brovana  and Pulmicort  - Symptoms appeared right after his port placement, I wonder whether he had a degree of aspiration during his sedation.  Cover with Augmentin  for 3 days  Active problems Stage IV non-small cell lung cancer, squamous cell carcinoma -this was recently diagnosed, seeing Dr. Sherrod as an outpatient.  He had a port placed last week, and was actually supposed to start chemotherapy today 1/19.  This is now on hold - Sister is tearful in the room, she is not sure whether he should go ahead with chemotherapy given that he has the potential of making him a lot sicker.  She does want to try a round of treatment, at least to see how he responds - Discussed with Dr. Gatha, treatment is now on hold until he recovers from this acute illness  History of PE-continue Eliquis   Hyperlipidemia-continue  statin  Schizophrenia -at baseline alert to self, sister makes medical decisions.  Continue home medications  Tobacco use-in remission  BPH-continue Flomax    Scheduled Meds:  amoxicillin -clavulanate  1 tablet Oral Q12H   apixaban   5 mg Oral BID   arformoterol   15 mcg Nebulization BID   atorvastatin   10 mg Oral QHS   benztropine   1 mg Oral BID   budesonide  (PULMICORT ) nebulizer solution  0.25 mg Nebulization BID   carvedilol   6.25 mg Oral BID WC   doxycycline   100 mg Oral Daily   famotidine   40 mg Oral BID   ipratropium-albuterol   3 mL Nebulization Q4H   loratadine   10 mg Oral Daily   pneumococcal 20-valent conjugate vaccine  0.5 mL Intramuscular Tomorrow-1000   polyethylene glycol  17 g Oral Daily   predniSONE   40 mg Oral Q breakfast   risperiDONE   2 mg Oral BID   tamsulosin   0.4 mg Oral Daily   Continuous Infusions: PRN Meds:.acetaminophen  **OR** acetaminophen , albuterol , albuterol , hydrOXYzine , mirtazapine , ondansetron  **OR** ondansetron  (ZOFRAN ) IV  Current Outpatient Medications  Medication Instructions   acetaminophen  (TYLENOL ) 1,000 mg, Oral, Every 8 hours   albuterol  (PROVENTIL ) 2.5 mg, Every 6 hours PRN   albuterol  (VENTOLIN  HFA) 108 (90 Base) MCG/ACT inhaler 2 puffs, Inhalation, Every 4 hours PRN   apixaban  (ELIQUIS ) 5 mg, Oral, 2 times daily   Artificial Tear Ointment (DRY EYES OP) 1 drop, 2 times daily PRN   atorvastatin  (LIPITOR) 10 mg, Oral, Daily at bedtime   benztropine  (COGENTIN ) 1 mg, Oral, 2 times daily   carvedilol  (COREG ) 6.25  mg, Oral, 2 times daily with meals   cetirizine (ZYRTEC) 10 mg, Daily PRN   cholecalciferol  (VITAMIN D3) 2,000-4,000 Units, Every morning   cyanocobalamin  1,000 mcg, Daily   famotidine  (PEPCID ) 40 mg, 2 times daily   feeding supplement, GLUCERNA SHAKE, (GLUCERNA SHAKE) LIQD 237 mLs, Oral, 3 times daily between meals   Fluticasone -Umeclidin-Vilant (TRELEGY ELLIPTA ) 100-62.5-25 MCG/ACT AEPB 1 puff, Inhalation, Daily   guaiFENesin   (MUCINEX ) 600 mg, Oral, 2 times daily PRN   hydrOXYzine  (ATARAX ) 25 mg, Oral, 3 times daily PRN   ipratropium-albuterol  (DUONEB) 0.5-2.5 (3) MG/3ML SOLN 3 mLs, Nebulization, Every 4 hours   Januvia 100 mg, Every evening   lidocaine  (LIDODERM ) 5 % 1 patch, Transdermal, Every 24 hours, Remove & Discard patch within 12 hours or as directed by MD   lidocaine -prilocaine  (EMLA ) cream Apply to affected area once   methocarbamol  (ROBAXIN ) 750 mg, Oral, Every 8 hours PRN   mirtazapine  (REMERON ) 30 mg, Oral, At bedtime PRN   multivitamin (ONE-A-DAY MEN'S) TABS tablet 1 tablet, Daily   ondansetron  (ZOFRAN ) 8 mg, Oral, Every 8 hours PRN, Start on the third day after carboplatin.   OXYGEN  2 L/min, Continuous   polyethylene glycol (MIRALAX  / GLYCOLAX ) 17 g, Oral, Daily   predniSONE  (DELTASONE ) 20 MG tablet Take 40 mg by mouth daily for 3 days, then 30 mg by mouth daily for 3 days, then go back on your usual maintenance regimen of prednisone  20 mg by mouth daily.   prochlorperazine  (COMPAZINE ) 10 mg, Oral, Every 6 hours PRN   risperiDONE  (RISPERDAL ) 2 mg, Oral, 2 times daily   sodium chloride  (OCEAN) 0.65 % SOLN nasal spray 1 spray, As needed   tamsulosin  (FLOMAX ) 0.4 mg, 2 times daily   traMADol  (ULTRAM ) 50 mg, Oral, Every 6 hours PRN    Diet Orders (From admission, onward)     Start     Ordered   04/03/24 2021  Diet Heart Room service appropriate? Yes; Fluid consistency: Thin  Diet effective now       Question Answer Comment  Room service appropriate? Yes   Fluid consistency: Thin      04/03/24 2022            DVT prophylaxis:  apixaban  (ELIQUIS ) tablet 5 mg   Lab Results  Component Value Date   PLT 248 04/04/2024      Code Status: Full Code  Family Communication: Sister present at bedside   Level of care: Telemetry  Consultants:  None  Objective: Vitals:   04/04/24 0315 04/04/24 0346 04/04/24 0809 04/04/24 0947  BP:  102/63 105/77   Pulse:  (!) 102 96   Resp:   16    Temp:  98.1 F (36.7 C) 98.5 F (36.9 C)   TempSrc:  Oral Oral   SpO2: 97% 100% 98% 99%  Weight:      Height:        Intake/Output Summary (Last 24 hours) at 04/04/2024 1058 Last data filed at 04/04/2024 0300 Gross per 24 hour  Intake --  Output 150 ml  Net -150 ml   Wt Readings from Last 3 Encounters:  04/03/24 64 kg  03/31/24 63.5 kg  02/25/24 61.7 kg    Examination:  Constitutional: NAD but tachypneic on my evaluation Eyes: no scleral icterus ENMT: Mucous membranes are moist.  Neck: normal, supple Respiratory: Faint end expiratory wheezing, no crackles.  Tachypneic Cardiovascular: Regular rate and rhythm, no murmurs / rubs / gallops. No LE edema.  Abdomen: non distended,  no tenderness. Bowel sounds positive.  Musculoskeletal: no clubbing / cyanosis.   Data Reviewed: I have independently reviewed following labs and imaging studies   CBC Recent Labs  Lab 04/03/24 1539 04/03/24 2238 04/04/24 0526  WBC 13.0* 9.5 8.9  HGB 13.3 12.8* 11.9*  HCT 40.5 38.6* 36.7*  PLT 285 257 248  MCV 81.2 80.9 79.8*  MCH 26.7 26.8 25.9*  MCHC 32.8 33.2 32.4  RDW 15.6* 15.5 15.3  LYMPHSABS 1.6  --   --   MONOABS 1.2*  --   --   EOSABS 0.7*  --   --   BASOSABS 0.1  --   --     Recent Labs  Lab 04/03/24 1539 04/03/24 1628 04/03/24 2238 04/04/24 0526  NA 130*  --   --  133*  K 4.2  --   --  4.0  CL 95*  --   --  99  CO2 26  --   --  26  GLUCOSE 167*  --   --  102*  BUN 5*  --   --  6*  CREATININE 0.62  --  0.62 0.58*  CALCIUM  9.3  --   --  9.0  AST 18  --   --  10*  ALT 11  --   --  7  ALKPHOS 87  --   --  79  BILITOT 0.5  --   --  0.5  ALBUMIN 3.8  --   --  3.5  MG 2.9*  --   --   --   DDIMER 1.63*  --   --   --   LATICACIDVEN  --  1.6  --   --   INR 0.7*  --   --   --     ------------------------------------------------------------------------------------------------------------------ No results for input(s): CHOL, HDL, LDLCALC, TRIG, CHOLHDL,  LDLDIRECT in the last 72 hours.  Lab Results  Component Value Date   HGBA1C 5.9 (H) 08/10/2023   ------------------------------------------------------------------------------------------------------------------ No results for input(s): TSH, T4TOTAL, T3FREE, THYROIDAB in the last 72 hours.  Invalid input(s): FREET3  Cardiac Enzymes No results for input(s): CKMB, TROPONINI, MYOGLOBIN in the last 168 hours.  Invalid input(s): CK ------------------------------------------------------------------------------------------------------------------    Component Value Date/Time   BNP 54.9 02/06/2024 2015    CBG: Recent Labs  Lab 03/31/24 0953  GLUCAP 84    Recent Results (from the past 240 hours)  Blood Culture (routine x 2)     Status: None (Preliminary result)   Collection Time: 04/03/24  3:30 PM   Specimen: BLOOD  Result Value Ref Range Status   Specimen Description   Final    BLOOD BLOOD LEFT ARM Performed at [facility], [address] [place], [place] [address]    Special Requests   Final    BOTTLES DRAWN AEROBIC AND ANAEROBIC Blood Culture results may not be optimal due to an inadequate volume of blood received in culture bottles Performed at [facility], [address], [place], [place] [address]    Culture   Final    NO GROWTH < 12 HOURS Performed at [facility], [address], [place], [place] [address]    Report Status PENDING  Incomplete  Blood Culture (routine x 2)     Status: None (Preliminary result)   Collection Time: 04/03/24  3:56 PM   Specimen: BLOOD  Result Value Ref Range Status   Specimen Description   Final    BLOOD BLOOD RIGHT ARM  Performed at [facility], [address], [place], [place] [address]    Special Requests   Final    BOTTLES DRAWN AEROBIC AND ANAEROBIC Blood Culture results may not be optimal due to an inadequate volume of blood received in culture  bottles Performed at [facility], [address] [place], [place] [address]    Culture   Final    NO GROWTH < 12 HOURS Performed at [facility], [address], [place], [place] [address]    Report Status PENDING  Incomplete  Resp panel by RT-PCR (RSV, Flu A&B, Covid) Anterior Nasal Swab     Status: None   Collection Time: 04/03/24  9:01 PM   Specimen: Anterior Nasal Swab  Result Value Ref Range Status   SARS Coronavirus 2 by RT PCR NEGATIVE NEGATIVE Final    Comment: (NOTE) SARS-CoV-2 target nucleic acids are NOT DETECTED.  The SARS-CoV-2 RNA is generally detectable in upper respiratory specimens during the acute phase of infection. The lowest concentration of SARS-CoV-2 viral copies this assay can detect is 138 copies/mL. A negative result does not preclude SARS-Cov-2 infection and should not be used as the sole basis for treatment or other patient management decisions. A negative result may occur with  improper specimen collection/handling, submission of specimen other than nasopharyngeal swab, presence of viral mutation(s) within the areas targeted by this assay, and inadequate number of viral copies(<138 copies/mL). A negative result must be combined with clinical observations, patient history, and epidemiological information. The expected result is Negative.  Fact Sheet for Patients:  bloggercourse.com  Fact Sheet for Healthcare Providers:  seriousbroker.it  This test is no t yet approved or cleared by the [place]  FDA and  has been authorized for detection and/or diagnosis of SARS-CoV-2 by FDA under an Emergency Use Authorization (EUA). This EUA will remain  in effect (meaning this test can be used) for the duration of the COVID-19 declaration under Section 564(b)(1) of the Act, 21 U.S.C.section 360bbb-3(b)(1), unless the authorization is terminated  or revoked sooner.       Influenza  A by PCR NEGATIVE NEGATIVE Final   Influenza B by PCR NEGATIVE NEGATIVE Final    Comment: (NOTE) The Xpert Xpress SARS-CoV-2/FLU/RSV plus assay is intended as an aid in the diagnosis of influenza from Nasopharyngeal swab specimens and should not be used as a sole basis for treatment. Nasal washings and aspirates are unacceptable for Xpert Xpress SARS-CoV-2/FLU/RSV testing.  Fact Sheet for Patients: bloggercourse.com  Fact Sheet for Healthcare Providers: seriousbroker.it  This test is not yet approved or cleared by the [place]  FDA and has been authorized for detection and/or diagnosis of SARS-CoV-2 by FDA under an Emergency Use Authorization (EUA). This EUA will remain in effect (meaning this test can be used) for the duration of the COVID-19 declaration under Section 564(b)(1) of the Act, 21 U.S.C. section 360bbb-3(b)(1), unless the authorization is terminated or revoked.     Resp Syncytial Virus by PCR NEGATIVE NEGATIVE Final    Comment: (NOTE) Fact Sheet for Patients: bloggercourse.com  Fact Sheet for Healthcare Providers: seriousbroker.it  This test is not yet approved or cleared by the [place]  FDA and has been authorized for detection and/or diagnosis of SARS-CoV-2 by FDA under an Emergency Use Authorization (EUA). This EUA will remain in effect (meaning this test can be used) for the duration of the COVID-19 declaration under Section 564(b)(1) of the Act, 21 U.S.C. section 360bbb-3(b)(1), unless the authorization is terminated  or revoked.  Performed at [facility], [address] [place], [place] [address]   Expectorated Sputum Assessment w Gram Stain, Rflx to Resp Cult     Status: None   Collection Time: 04/04/24  8:46 AM   Specimen: Sputum  Result Value Ref Range Status   Specimen Description SPUTUM  Final   Special Requests NONE   Final   Sputum evaluation   Final    THIS SPECIMEN IS ACCEPTABLE FOR SPUTUM CULTURE Performed at [facility], [address], [place], [place] [address]    Report Status 04/04/2024 FINAL  Final     Radiology Studies: CT Angio Chest PE W and/or Wo Contrast Result Date: 04/03/2024 EXAM: CTA CHEST 04/03/2024 04:43:05 PM TECHNIQUE: CTA of the chest was performed after the administration of intravenous contrast. Multiplanar reformatted images are provided for review. MIP images are provided for review. Automated exposure control, iterative reconstruction, and/or weight based adjustment of the mA/kV was utilized to reduce the radiation dose to as low as reasonably achievable. COMPARISON: CT angiogram chest 02/06/2024. CLINICAL HISTORY: Pulmonary embolism (PE) suspected, low to intermediate prob, neg D-dimer. FINDINGS: PULMONARY ARTERIES: Pulmonary arteries are adequately opacified for evaluation. Chronic appearing right lower lobe pulmonary emboli appear unchanged at the segmental and subsegmental levels. No new pulmonary embolism identified. Main pulmonary artery is normal in caliber. MEDIASTINUM: Coronary and aortic atherosclerotic calcifications are noted. Right chest port in place with distal catheter ending in the distal SVC. The heart and pericardium demonstrate no acute abnormality. There is no acute abnormality of the thoracic aorta. LYMPH NODES: Subcarinal lymphadenopathy measuring 3.0 x 4.1 cm, unchanged. No hilar or axillary lymphadenopathy. LUNGS AND PLEURA: Severe emphysema and bullae are again noted in both lungs. Masslike consolidation in the right lower lobe extending to the right hilum measuring approximately 4.9 x 4.6 cm that is not significantly changed. There is plugging/cutoff of more peripheral right lower lobe bronchi in the region of the masslike consolidation similar to prior. Nodular density in the left upper lobe triangular in shape measuring up to 9 mm on image  12/97 appears unchanged. Mucous plugging throughout the left lower lobe with some ill defined nodular densities appears unchanged. There is a trace right pleural effusion, unchanged. No pneumothorax. No new focal lung infiltrate. UPPER ABDOMEN: The gallbladder surgically absent. Hypodense lesion in the inferior right lobe of the liver measures 2.7 cm and was incompletely imaged on the prior study. Left renal cysts are again seen. SOFT TISSUES AND BONES: Disc deformities of T6, T7 and T8 appear unchanged. No acute soft tissue abnormality. IMPRESSION: 1. No new pulmonary embolism is identified, with unchanged chronic right lower lobe pulmonary emboli at the segmental and subsegmental levels. 2. Severe emphysema with bullae in both lungs. 3. Unchanged masslike consolidation in the right lower lobe extending to the right hilum, measuring approximately 4.9 x 4.6 cm. 4. Unchanged subcarinal lymphadenopathy measuring 3.0 x 4.1 cm. Electronically signed by: Greig Pique MD 04/03/2024 05:51 PM EST RP Workstation: [id number]   DG Chest Port 1 View Result Date: 04/03/2024 EXAM: 2 VIEW(S) XRAY OF THE CHEST 04/03/2024 03:50:00 PM COMPARISON: 11/27/2023 CLINICAL HISTORY: sob, recent R-sided port placement FINDINGS: LINES, TUBES AND DEVICES: Right chest Port-A-Cath in place with tip projecting over the superior cavoatrial junction. LUNGS AND PLEURA: Small right pleural effusion. Increased patchy opacities at the right lung base. Multiple bulla in the right lung base and right upper lobe. No pneumothorax. HEART AND MEDIASTINUM: No acute abnormality of the  cardiac and mediastinal silhouettes. BONES AND SOFT TISSUES: No acute osseous abnormality. IMPRESSION: 1. Increased patchy opacities at the right lung base. 2. Right chest Port-A-Cath with tip at the superior cavoatrial junction. 3. Small right pleural effusion. 4. Multiple bulla in the right lung base and right upper lobe. Electronically signed by: Greig Pique MD 04/03/2024  04:34 PM EST RP Workstation: [id number]     Nilda Fendt, MD, PhD [facility]  Between 7 am - 7 pm I am available, please contact me via Amion (for emergencies) or Securechat (non urgent messages)  Between 7 pm - 7 am I am not available, please contact night coverage MD/APP via Amion  "

## 2024-04-04 NOTE — Progress Notes (Signed)
 Pt seems to be taking lots of breaths.  Nurse noticed pts O2 was 80-84% on 2 Liters.  Nurse instructed pt to breathe via nose.  Oxy sats still around 85%.  Placed pt on 4 liters of O2, now saturations at 98%.  Will continue to monitor and notify MD.

## 2024-04-04 NOTE — Progress Notes (Signed)
 Pt c/o of SOB.  Notified MD.  Pt's oxygen  96% on 2 liters now.  Will continue to monitor.

## 2024-04-05 ENCOUNTER — Other Ambulatory Visit (HOSPITAL_COMMUNITY): Payer: Self-pay

## 2024-04-05 ENCOUNTER — Encounter: Payer: Self-pay | Admitting: Internal Medicine

## 2024-04-05 DIAGNOSIS — J441 Chronic obstructive pulmonary disease with (acute) exacerbation: Secondary | ICD-10-CM | POA: Diagnosis not present

## 2024-04-05 LAB — CBC
HCT: 35.2 % — ABNORMAL LOW (ref 39.0–52.0)
Hemoglobin: 11.7 g/dL — ABNORMAL LOW (ref 13.0–17.0)
MCH: 26.4 pg (ref 26.0–34.0)
MCHC: 33.2 g/dL (ref 30.0–36.0)
MCV: 79.3 fL — ABNORMAL LOW (ref 80.0–100.0)
Platelets: 254 K/uL (ref 150–400)
RBC: 4.44 MIL/uL (ref 4.22–5.81)
RDW: 15.1 % (ref 11.5–15.5)
WBC: 9.8 K/uL (ref 4.0–10.5)
nRBC: 0 % (ref 0.0–0.2)

## 2024-04-05 LAB — COMPREHENSIVE METABOLIC PANEL WITH GFR
ALT: 12 U/L (ref 0–44)
AST: 18 U/L (ref 15–41)
Albumin: 3.5 g/dL (ref 3.5–5.0)
Alkaline Phosphatase: 75 U/L (ref 38–126)
Anion gap: 9 (ref 5–15)
BUN: 11 mg/dL (ref 8–23)
CO2: 27 mmol/L (ref 22–32)
Calcium: 9.2 mg/dL (ref 8.9–10.3)
Chloride: 99 mmol/L (ref 98–111)
Creatinine, Ser: 0.57 mg/dL — ABNORMAL LOW (ref 0.61–1.24)
GFR, Estimated: >60 mL/min
Glucose, Bld: 107 mg/dL — ABNORMAL HIGH (ref 70–99)
Potassium: 4.4 mmol/L (ref 3.5–5.1)
Sodium: 135 mmol/L (ref 135–145)
Total Bilirubin: 0.3 mg/dL (ref 0.0–1.2)
Total Protein: 6.8 g/dL (ref 6.5–8.1)

## 2024-04-05 LAB — MAGNESIUM: Magnesium: 2 mg/dL (ref 1.7–2.4)

## 2024-04-05 MED ORDER — IPRATROPIUM-ALBUTEROL 0.5-2.5 (3) MG/3ML IN SOLN
3.0000 mL | RESPIRATORY_TRACT | Status: DC
  Administered 2024-04-05 – 2024-04-07 (×9): 3 mL via RESPIRATORY_TRACT
  Filled 2024-04-05 (×9): qty 3

## 2024-04-05 NOTE — Evaluation (Signed)
 Physical Therapy Evaluation Patient Details Name: Blake Mcknight MRN: [id number] DOB: June 23, 1955 Today's Date: 04/05/2024  History of Present Illness  Pt is a 69y.o. male who presented to [facility] on 04/03/24 from home due to SOB. PMH: COPD on 4L, HTN, PE on Eliquis , recently diagnosed lung cancer, tobacco abuse, and schizophrenia. Port was placed last week and he has been feeling weak, fatigued, increased SOB, walking less and refusing food since placement.  Clinical Impression  PTA, pt reports being IND with mobility and ADLs at home, his sister provides assistance for all IADLs and transportation. At evaluation, he is functioning near his baseline requiring SPV for mobility with cues for safety and sequencing. He is limited by decreased functional endurance and activity tolerance. He is currently requiring 4L O2 with O2 sats >96%, however increased RR noted with pt reporting no SOB. He will benefit from therapy services while admitted to acute, however he does not need follow up therapy at discharge.         If plan is discharge home, recommend the following: A little help with walking and/or transfers;A little help with bathing/dressing/bathroom;Assistance with cooking/housework;Assist for transportation;Help with stairs or ramp for entrance   Can travel by private vehicle        Equipment Recommendations None recommended by PT  Recommendations for Other Services       Functional Status Assessment Patient has had a recent decline in their functional status and demonstrates the ability to make significant improvements in function in a reasonable and predictable amount of time.     Precautions / Restrictions Precautions Precautions: Fall Recall of Precautions/Restrictions: Impaired Precaution/Restrictions Comments: poor safety awareness, moved from bathroom back to bed without calling for staff to assist Restrictions Weight Bearing Restrictions Per Provider Order: No       Mobility  Bed Mobility Overal bed mobility: Modified Independent             General bed mobility comments: poor safety awareness, attempting to get in/out of bed with side rails up    Transfers Overall transfer level: Needs assistance Equipment used: None Transfers: Sit to/from Stand, Bed to chair/wheelchair/BSC Sit to Stand: Supervision   Step pivot transfers: Supervision       General transfer comment: transfers without physical assistance, however requires cues for safety, sequencing and line management    Ambulation/Gait Ambulation/Gait assistance: Supervision Gait Distance (Feet): 10 Feet Assistive device: None   Gait velocity: decreased     General Gait Details: cues for safety and O2 line awareness, amb from bathroom without assistance of device, decreased functional activity tolerance and endurance  Stairs            Wheelchair Mobility     Tilt Bed    Modified Rankin (Stroke Patients Only)       Balance Overall balance assessment: Mild deficits observed, not formally tested                                           Pertinent Vitals/Pain Pain Assessment Pain Assessment: No/denies pain    Home Living Family/patient expects to be discharged to:: Private residence Living Arrangements: Other relatives (sister) Available Help at Discharge: Family;Available 24 hours/day Type of Home: House Home Access: Stairs to enter Entrance Stairs-Rails: None Entrance Stairs-Number of Steps: 2-4   Home Layout: One level Home Equipment: Wheelchair - Biomedical Scientist (2 wheels) Additional Comments:  pt reports not using RW or WC at home, furnitures surfs sometimes around the home    Prior Function Prior Level of Function : Needs assist             Mobility Comments: pt reports not needing to use RW or WC at home, reports being IND with mobility at home; per chart review, pt has been getting weaker the last 2  weeks ADLs Comments: pt is IND with ADLs, sister provides IADLs, and transportation     Extremity/Trunk Assessment   Upper Extremity Assessment Upper Extremity Assessment: Overall WFL for tasks assessed    Lower Extremity Assessment Lower Extremity Assessment: Generalized weakness;RLE deficits/detail;LLE deficits/detail RLE Deficits / Details: unable to complete MMT assessment due to poor command following, grossly 3/5 RLE Sensation: WNL LLE Deficits / Details: unable to complete MMT assessment due to poor command following, grossly 3/5 LLE Sensation: WNL    Cervical / Trunk Assessment Cervical / Trunk Assessment: Kyphotic (mild)  Communication   Communication Communication: Impaired (delayed response) Factors Affecting Communication: Difficulty expressing self;Hearing impaired    Cognition Arousal: Alert Behavior During Therapy: WFL for tasks assessed/performed   PT - Cognitive impairments: Sequencing, Problem solving, Safety/Judgement, Awareness                       PT - Cognition Comments: A&O x3, knew he was in a hospital but could not state name of hospital; Delayed response to questions, increased time for processing; poor safety awareness and attempting to move around room on his own Following commands: Impaired Following commands impaired: Follows one step commands with increased time, Follows multi-step commands inconsistently     Cueing Cueing Techniques: Verbal cues, Visual cues, Tactile cues, Gestural cues     General Comments      Exercises     Assessment/Plan    PT Assessment Patient needs continued PT services  PT Problem List Decreased strength;Decreased safety awareness;Decreased balance;Decreased mobility;Decreased activity tolerance       PT Treatment Interventions Gait training;Stair training;Patient/family education;Functional mobility training;Therapeutic activities;Therapeutic exercise    PT Goals (Current goals can be found in  the Care Plan section)  Acute Rehab PT Goals Patient Stated Goal: go home PT Goal Formulation: With patient Time For Goal Achievement: 04/19/24 Potential to Achieve Goals: Good    Frequency Min 2X/week     Co-evaluation               AM-PAC PT 6 Clicks Mobility  Outcome Measure Help needed turning from your back to your side while in a flat bed without using bedrails?: None Help needed moving from lying on your back to sitting on the side of a flat bed without using bedrails?: None Help needed moving to and from a bed to a chair (including a wheelchair)?: A Little Help needed standing up from a chair using your arms (e.g., wheelchair or bedside chair)?: A Little Help needed to walk in hospital room?: A Little Help needed climbing 3-5 steps with a railing? : A Lot 6 Click Score: 19    End of Session   Activity Tolerance: Patient tolerated treatment well;Patient limited by fatigue Patient left: in bed;with call bell/phone within reach;with bed alarm set Nurse Communication: Mobility status PT Visit Diagnosis: Muscle weakness (generalized) (M62.81);Unsteadiness on feet (R26.81)    Time: 1135-1150 PT Time Calculation (min) (ACUTE ONLY): 15 min   Charges:   PT Evaluation $PT Eval Low Complexity: 1 Low  Isaiah DEL. Jozee Hammer, PT, DPT   [employer] 04/05/2024, 12:00 PM

## 2024-04-05 NOTE — Plan of Care (Signed)

## 2024-04-05 NOTE — Progress Notes (Signed)
 " PROGRESS NOTE  Blake Mcknight FMW:[id number] DOB: [birth year] DOA: 04/03/2024 PCP: [name], MD   LOS: 2 days   Brief Narrative / Interim history: 69 year old male with history of COPD on home oxygen  2 L, HTN, prior PE on Eliquis , recently diagnosed lung cancer, tobacco use in remission, schizophrenia comes into the hospital with shortness of breath.  Patient's sister tells me that he had the port for chemotherapy placed last week, and ever since then he has been having fatigue, weakness, has been having increased shortness of breath, has been ambulating less and refusing food.  He has also been having a cough but there is a chronic component to his cough.  She reports subjective fevers  Subjective / 24h Interval events: He became more short of breath last night having to go up on oxygen .  He appears more comfortable this morning.  He is pleasantly confused and alert to self only.  Sister is not at bedside today  Assesement and Plan: Principal problem COPD exacerbation, chronic hypoxic respiratory failure -patient found to have wheezing on admission, admitted and started for treatment of COPD exacerbation with steroids, nebulizers. - Continue Brovana , Pulmicort  - Symptoms appeared right after his port placement, I wonder whether he had a degree of aspiration during his sedation.  Cover with Augmentin  for 3 days, today is day #2 - Had slightly worsening respiratory status last night, with increased tachypnea, but he is better this morning.  He is overall improving, anticipate potential discharge tomorrow  Active problems Stage IV non-small cell lung cancer, squamous cell carcinoma -this was recently diagnosed, seeing Dr. Sherrod as an outpatient.  He had a port placed last week, and was actually supposed to start chemotherapy today 1/19.  This is now on hold - Sister is tearful in the room, she is not sure whether he should go ahead with chemotherapy given that he has the potential  of making him a lot sicker.  She does want to try a round of treatment, at least to see how he responds - Discussed with Dr. Gatha, treatment is now on hold until he recovers from this acute illness  History of PE-continue Eliquis   Hyperlipidemia-continue statin  Schizophrenia -at baseline alert to self, sister makes medical decisions.  Continue home medications  Tobacco use-in remission  BPH-continue Flomax    Scheduled Meds:  amoxicillin -clavulanate  1 tablet Oral Q12H   apixaban   5 mg Oral BID   arformoterol   15 mcg Nebulization BID   atorvastatin   10 mg Oral QHS   benztropine   1 mg Oral BID   budesonide  (PULMICORT ) nebulizer solution  0.25 mg Nebulization BID   carvedilol   6.25 mg Oral BID WC   doxycycline   100 mg Oral Daily   famotidine   40 mg Oral BID   ipratropium-albuterol   3 mL Nebulization QID   loratadine   10 mg Oral Daily   pneumococcal 20-valent conjugate vaccine  0.5 mL Intramuscular Tomorrow-1000   polyethylene glycol  17 g Oral Daily   predniSONE   40 mg Oral Q breakfast   risperiDONE   2 mg Oral BID   tamsulosin   0.4 mg Oral Daily   Continuous Infusions: PRN Meds:.acetaminophen  **OR** acetaminophen , albuterol , albuterol , hydrOXYzine , mirtazapine , ondansetron  **OR** ondansetron  (ZOFRAN ) IV  Current Outpatient Medications  Medication Instructions   acetaminophen  (TYLENOL ) 1,000 mg, Oral, Every 8 hours   albuterol  (PROVENTIL ) 2.5 mg, Nebulization, Every 6 hours PRN   albuterol  (VENTOLIN  HFA) 108 (90 Base) MCG/ACT inhaler 2 puffs, Inhalation, Every 4 hours PRN  amLODipine  (NORVASC ) 5 mg, Oral, Daily   apixaban  (ELIQUIS ) 5 mg, Oral, 2 times daily   ARTIFICIAL TEAR SOLUTION OP 1 drop, 3 times daily PRN   atorvastatin  (LIPITOR) 10 mg, Oral, Daily at bedtime   benztropine  (COGENTIN ) 1 mg, Oral, 2 times daily   carvedilol  (COREG ) 6.25 mg, Oral, 2 times daily with meals   cetirizine (ZYRTEC) 10 mg, Oral, Daily PRN   cyanocobalamin  1,000 mcg, Daily   famotidine   (PEPCID ) 40 mg, Oral, 2 times daily   feeding supplement, GLUCERNA SHAKE, (GLUCERNA SHAKE) LIQD 237 mLs, Oral, 3 times daily between meals   fluticasone  (FLONASE) 50 MCG/ACT nasal spray 1 spray, Each Nare, Daily   Fluticasone -Umeclidin-Vilant (TRELEGY ELLIPTA ) 100-62.5-25 MCG/ACT AEPB 1 puff, Inhalation, Daily   guaiFENesin  (MUCINEX ) 600 mg, Oral, 2 times daily PRN   hydrOXYzine  (ATARAX ) 25 mg, Oral, 3 times daily PRN   ipratropium-albuterol  (DUONEB) 0.5-2.5 (3) MG/3ML SOLN 3 mLs, Nebulization, Every 4 hours   Januvia 100 mg, Oral, Every evening   lidocaine  (LIDODERM ) 5 % 1 patch, Transdermal, Every 24 hours, Remove & Discard patch within 12 hours or as directed by MD   lidocaine -prilocaine  (EMLA ) cream Apply to affected area once   methocarbamol  (ROBAXIN ) 750 mg, Oral, Every 8 hours PRN   mirtazapine  (REMERON ) 30 mg, Oral, At bedtime PRN   multivitamin (ONE-A-DAY MEN'S) TABS tablet 1 tablet, Oral, 3 times weekly   ondansetron  (ZOFRAN ) 8 mg, Oral, Every 8 hours PRN, Start on the third day after carboplatin.   OXYGEN  2 L/min, Inhalation, Continuous   polyethylene glycol (MIRALAX  / GLYCOLAX ) 17 g, Oral, Daily   predniSONE  (DELTASONE ) 20 MG tablet Take 40 mg by mouth daily for 3 days, then 30 mg by mouth daily for 3 days, then go back on your usual maintenance regimen of prednisone  20 mg by mouth daily.   predniSONE  (DELTASONE ) 10 mg, Oral, 2 times daily with meals   prochlorperazine  (COMPAZINE ) 10 mg, Oral, Every 6 hours PRN   risperiDONE  (RISPERDAL ) 2 mg, Oral, 2 times daily   sodium chloride  (OCEAN) 0.65 % SOLN nasal spray 1 spray, Each Nare, As needed   tamsulosin  (FLOMAX ) 0.4 mg, Oral, 2 times daily   traMADol  (ULTRAM ) 50 mg, Oral, Every 6 hours PRN   Vitamin D3 2,000-4,000 Units, Oral, 3 times weekly    Diet Orders (From admission, onward)     Start     Ordered   04/03/24 2021  Diet Heart Room service appropriate? Yes; Fluid consistency: Thin  Diet effective now       Question Answer  Comment  Room service appropriate? Yes   Fluid consistency: Thin      04/03/24 2022            DVT prophylaxis:  apixaban  (ELIQUIS ) tablet 5 mg   Lab Results  Component Value Date   PLT 248 04/04/2024      Code Status: Full Code  Family Communication: Sister present at bedside   Level of care: Telemetry  Consultants:  None  Objective: Vitals:   04/04/24 1957 04/05/24 0518 04/05/24 0822 04/05/24 0845  BP:  107/75    Pulse:  78 96   Resp:  18    Temp:  (!) 97.4 F (36.3 C)    TempSrc:  Oral    SpO2: 99% 100% 97% 100%  Weight:      Height:        Intake/Output Summary (Last 24 hours) at 04/05/2024 1311 Last data filed at 04/05/2024 0800 Gross  per 24 hour  Intake 100 ml  Output 200 ml  Net -100 ml   Wt Readings from Last 3 Encounters:  04/03/24 64 kg  03/31/24 63.5 kg  02/25/24 61.7 kg    Examination:  Constitutional: NAD Eyes: lids and conjunctivae normal, no scleral icterus ENMT: mmm Neck: normal, supple Respiratory: Faint end expiratory wheezing, no crackles.  Occasional cough Cardiovascular: Regular rate and rhythm, no murmurs / rubs / gallops. No LE edema. Abdomen: soft, no distention, no tenderness. Bowel sounds positive.   Data Reviewed: I have independently reviewed following labs and imaging studies   CBC Recent Labs  Lab 04/03/24 1539 04/03/24 2238 04/04/24 0526  WBC 13.0* 9.5 8.9  HGB 13.3 12.8* 11.9*  HCT 40.5 38.6* 36.7*  PLT 285 257 248  MCV 81.2 80.9 79.8*  MCH 26.7 26.8 25.9*  MCHC 32.8 33.2 32.4  RDW 15.6* 15.5 15.3  LYMPHSABS 1.6  --   --   MONOABS 1.2*  --   --   EOSABS 0.7*  --   --   BASOSABS 0.1  --   --     Recent Labs  Lab 04/03/24 1539 04/03/24 1628 04/03/24 2238 04/04/24 0526  NA 130*  --   --  133*  K 4.2  --   --  4.0  CL 95*  --   --  99  CO2 26  --   --  26  GLUCOSE 167*  --   --  102*  BUN 5*  --   --  6*  CREATININE 0.62  --  0.62 0.58*  CALCIUM  9.3  --   --  9.0  AST 18  --   --  10*   ALT 11  --   --  7  ALKPHOS 87  --   --  79  BILITOT 0.5  --   --  0.5  ALBUMIN 3.8  --   --  3.5  MG 2.9*  --   --   --   DDIMER 1.63*  --   --   --   LATICACIDVEN  --  1.6  --   --   INR 0.7*  --   --   --     ------------------------------------------------------------------------------------------------------------------ No results for input(s): CHOL, HDL, LDLCALC, TRIG, CHOLHDL, LDLDIRECT in the last 72 hours.  Lab Results  Component Value Date   HGBA1C 5.9 (H) 08/10/2023   ------------------------------------------------------------------------------------------------------------------ No results for input(s): TSH, T4TOTAL, T3FREE, THYROIDAB in the last 72 hours.  Invalid input(s): FREET3  Cardiac Enzymes No results for input(s): CKMB, TROPONINI, MYOGLOBIN in the last 168 hours.  Invalid input(s): CK ------------------------------------------------------------------------------------------------------------------    Component Value Date/Time   BNP 54.9 02/06/2024 2015    CBG: Recent Labs  Lab 03/31/24 0953  GLUCAP 84    Recent Results (from the past 240 hours)  Blood Culture (routine x 2)     Status: None (Preliminary result)   Collection Time: 04/03/24  3:30 PM   Specimen: BLOOD  Result Value Ref Range Status   Specimen Description   Final    BLOOD BLOOD LEFT ARM Performed at [facility], [address], [place], [place] [address]    Special Requests   Final    BOTTLES DRAWN AEROBIC AND ANAEROBIC Blood Culture results may not be optimal due to an inadequate volume of blood received in culture bottles Performed at [facility], [address], [place], [place] [address]    Culture  Final    NO GROWTH 2 DAYS Performed at [facility], [address], [place], [place] [address]    Report Status PENDING  Incomplete  Blood Culture (routine x 2)     Status: None (Preliminary result)    Collection Time: 04/03/24  3:56 PM   Specimen: BLOOD  Result Value Ref Range Status   Specimen Description   Final    BLOOD BLOOD RIGHT ARM Performed at [facility], [address], [place], [place] [address]    Special Requests   Final    BOTTLES DRAWN AEROBIC AND ANAEROBIC Blood Culture results may not be optimal due to an inadequate volume of blood received in culture bottles Performed at [facility], [address] [place], [place] [address]    Culture   Final    NO GROWTH 2 DAYS Performed at [facility], [address] [place], [place] [address]    Report Status PENDING  Incomplete  Respiratory (~20 pathogens) panel by PCR     Status: None   Collection Time: 04/03/24  9:01 PM   Specimen: Anterior Nasal Swab; Respiratory  Result Value Ref Range Status   Adenovirus NOT DETECTED NOT DETECTED Final   Coronavirus 229E NOT DETECTED NOT DETECTED Final    Comment: (NOTE) The Coronavirus on the Respiratory Panel, DOES NOT test for the novel  Coronavirus (2019 nCoV)    Coronavirus HKU1 NOT DETECTED NOT DETECTED Final   Coronavirus NL63 NOT DETECTED NOT DETECTED Final   Coronavirus OC43 NOT DETECTED NOT DETECTED Final   Metapneumovirus NOT DETECTED NOT DETECTED Final   Rhinovirus / Enterovirus NOT DETECTED NOT DETECTED Final   Influenza A NOT DETECTED NOT DETECTED Final   Influenza B NOT DETECTED NOT DETECTED Final   Parainfluenza Virus 1 NOT DETECTED NOT DETECTED Final   Parainfluenza Virus 2 NOT DETECTED NOT DETECTED Final   Parainfluenza Virus 3 NOT DETECTED NOT DETECTED Final   Parainfluenza Virus 4 NOT DETECTED NOT DETECTED Final   Respiratory Syncytial Virus NOT DETECTED NOT DETECTED Final   Bordetella pertussis NOT DETECTED NOT DETECTED Final   Bordetella Parapertussis NOT DETECTED NOT DETECTED Final   Chlamydophila pneumoniae NOT DETECTED NOT DETECTED Final   Mycoplasma pneumoniae NOT DETECTED NOT DETECTED Final     Comment: Performed at [facility], [address], [place], [place] [address]  Resp panel by RT-PCR (RSV, Flu A&B, Covid) Anterior Nasal Swab     Status: None   Collection Time: 04/03/24  9:01 PM   Specimen: Anterior Nasal Swab  Result Value Ref Range Status   SARS Coronavirus 2 by RT PCR NEGATIVE NEGATIVE Final    Comment: (NOTE) SARS-CoV-2 target nucleic acids are NOT DETECTED.  The SARS-CoV-2 RNA is generally detectable in upper respiratory specimens during the acute phase of infection. The lowest concentration of SARS-CoV-2 viral copies this assay can detect is 138 copies/mL. A negative result does not preclude SARS-Cov-2 infection and should not be used as the sole basis for treatment or other patient management decisions. A negative result may occur with  improper specimen collection/handling, submission of specimen other than nasopharyngeal swab, presence of viral mutation(s) within the areas targeted by this assay, and inadequate number of viral copies(<138 copies/mL). A negative result must be combined with clinical observations, patient history, and epidemiological information. The expected result is Negative.  Fact Sheet for Patients:  bloggercourse.com  Fact Sheet for Healthcare Providers:  seriousbroker.it  This test is  no t yet approved or cleared by the [place]  FDA and  has been authorized for detection and/or diagnosis of SARS-CoV-2 by FDA under an Emergency Use Authorization (EUA). This EUA will remain  in effect (meaning this test can be used) for the duration of the COVID-19 declaration under Section 564(b)(1) of the Act, 21 U.S.C.section 360bbb-3(b)(1), unless the authorization is terminated  or revoked sooner.       Influenza A by PCR NEGATIVE NEGATIVE Final   Influenza B by PCR NEGATIVE NEGATIVE Final    Comment: (NOTE) The Xpert Xpress SARS-CoV-2/FLU/RSV plus assay is intended as an aid in  the diagnosis of influenza from Nasopharyngeal swab specimens and should not be used as a sole basis for treatment. Nasal washings and aspirates are unacceptable for Xpert Xpress SARS-CoV-2/FLU/RSV testing.  Fact Sheet for Patients: bloggercourse.com  Fact Sheet for Healthcare Providers: seriousbroker.it  This test is not yet approved or cleared by the [place]  FDA and has been authorized for detection and/or diagnosis of SARS-CoV-2 by FDA under an Emergency Use Authorization (EUA). This EUA will remain in effect (meaning this test can be used) for the duration of the COVID-19 declaration under Section 564(b)(1) of the Act, 21 U.S.C. section 360bbb-3(b)(1), unless the authorization is terminated or revoked.     Resp Syncytial Virus by PCR NEGATIVE NEGATIVE Final    Comment: (NOTE) Fact Sheet for Patients: bloggercourse.com  Fact Sheet for Healthcare Providers: seriousbroker.it  This test is not yet approved or cleared by the [place]  FDA and has been authorized for detection and/or diagnosis of SARS-CoV-2 by FDA under an Emergency Use Authorization (EUA). This EUA will remain in effect (meaning this test can be used) for the duration of the COVID-19 declaration under Section 564(b)(1) of the Act, 21 U.S.C. section 360bbb-3(b)(1), unless the authorization is terminated or revoked.  Performed at [facility], [address], [place], [place] [address]   Expectorated Sputum Assessment w Gram Stain, Rflx to Resp Cult     Status: None   Collection Time: 04/04/24  8:46 AM   Specimen: Sputum  Result Value Ref Range Status   Specimen Description SPUTUM  Final   Special Requests NONE  Final   Sputum evaluation   Final    THIS SPECIMEN IS ACCEPTABLE FOR SPUTUM CULTURE Performed at [facility], [address] [place], [place]  [address]    Report Status 04/04/2024 FINAL  Final  Culture, Respiratory w Gram Stain     Status: None (Preliminary result)   Collection Time: 04/04/24  8:46 AM   Specimen: SPU  Result Value Ref Range Status   Specimen Description   Final    SPUTUM Performed at [facility], [address], [place], [place] [address]    Special Requests   Final    NONE Reflexed from [id number] Performed at [facility], [address], [place], [place] [address]    Gram Stain NO WBC SEEN RARE GRAM POSITIVE COCCI   Final   Culture   Final    CULTURE REINCUBATED FOR BETTER GROWTH Performed at [facility], [address] [place], [place] [address]    Report Status PENDING  Incomplete     Radiology Studies: No results found.    Nilda Fendt, MD, PhD [facility]  Between 7 am - 7 pm I am available, please contact me via Amion (for emergencies) or Securechat (non urgent messages)  Between 7 pm - 7  am I am not available, please contact night coverage MD/APP via Amion  "

## 2024-04-06 ENCOUNTER — Inpatient Hospital Stay

## 2024-04-06 DIAGNOSIS — J441 Chronic obstructive pulmonary disease with (acute) exacerbation: Secondary | ICD-10-CM | POA: Diagnosis not present

## 2024-04-06 NOTE — Progress Notes (Signed)
 " PROGRESS NOTE    Blake Mcknight  FMW:[id number] DOB: 11-Dec-1955 DOA: 04/03/2024 PCP: [name], MD   Brief Narrative:  69 year old male with history of COPD on home oxygen  2 L, HTN, prior PE on Eliquis , recently diagnosed lung cancer, tobacco use in remission, schizophrenia comes into the hospital with shortness of breath.  Patient's sister tells me that he had the port for chemotherapy placed last week, and ever since then he has been having fatigue, weakness, has been having increased shortness of breath, has been ambulating less and refusing food.  He has also been having a cough but there is a chronic component to his cough.  She reports subjective fevers  Assessment & Plan:   Active Problems:   COPD with acute exacerbation (HCC)  Acute COPD exacerbation, with acute on chronic hypoxic respiratory failure - Continue Brovana , Pulmicort , steroids, nebs - Symptoms appeared right after his port placement, I wonder whether he had a degree of aspiration during his sedation. - Antibiotic course completed -Respiratory status seems to be minimally improved, continues to be markedly dyspneic with even short walks in his room despite improved hypoxia - hopeful for discharge in the next 24 hours   Stage IV non-small cell lung cancer, squamous cell carcinoma Recent diagnosis with Dr. Sherrod Scull placed with plan for chemotherapy initiation 1/19 currently on hold until current illness is resolved   History of PE-continue Eliquis   Hyperlipidemia-continue statin Schizophrenia -at baseline alert to self, sister makes medical decisions.  Continue home medications Tobacco use-in remission BPH-continue Flomax   DVT prophylaxis:  apixaban  (ELIQUIS ) tablet 5 mg  Code Status:   Code Status: Full Code Family Communication: None present  Status is: Inpatient  Dispo: The patient is from: Home              Anticipated d/c is to: Home              Anticipated d/c date is: 24 hours               Patient currently not medically stable for discharge given profound dyspnea today with minimal exertion  Consultants:  Oncology  Procedures:  None  Antimicrobials:  Completed  Subjective: No acute issues or events overnight denies nausea vomiting diarrhea constipation headache fevers chills or chest pain.  Shortness of breath well-controlled at rest but markedly uncontrolled with even minimal exertion  Objective: Vitals:   04/05/24 2224 04/05/24 2335 04/06/24 0420 04/06/24 0430  BP: 111/76   120/79  Pulse: 81     Resp: 18     Temp: 97.8 F (36.6 C)   98 F (36.7 C)  TempSrc:    Oral  SpO2: 91% 91% 97% 97%  Weight:      Height:       No intake or output data in the 24 hours ending 04/06/24 0823 Filed Weights   04/03/24 2250  Weight: 64 kg    Examination:  General:  Pleasantly resting in bed, No acute distress. HEENT:  Normocephalic atraumatic.  Sclerae nonicteric, noninjected.  Extraocular movements intact bilaterally. Neck:  Without mass or deformity.  Trachea is midline. Lungs:  Scant expiratory wheeze Heart:  Regular rate and rhythm.  Without murmurs, rubs, or gallops. Abdomen:  Soft, nontender, nondistended.  Without guarding or rebound. Extremities: Without cyanosis, clubbing, edema, or obvious deformity. Skin:  Warm and dry, no erythema.  Data Reviewed: I have personally reviewed following labs and imaging studies  CBC: Recent Labs  Lab 04/03/24 1539 04/03/24  2238 04/04/24 0526 04/05/24 2248  WBC 13.0* 9.5 8.9 9.8  NEUTROABS 9.4*  --   --   --   HGB 13.3 12.8* 11.9* 11.7*  HCT 40.5 38.6* 36.7* 35.2*  MCV 81.2 80.9 79.8* 79.3*  PLT 285 257 248 254   Basic Metabolic Panel: Recent Labs  Lab 04/03/24 1539 04/03/24 2238 04/04/24 0526 04/05/24 2248  NA 130*  --  133* 135  K 4.2  --  4.0 4.4  CL 95*  --  99 99  CO2 26  --  26 27  GLUCOSE 167*  --  102* 107*  BUN 5*  --  6* 11  CREATININE 0.62 0.62 0.58* 0.57*  CALCIUM  9.3  --  9.0 9.2   MG 2.9*  --   --  2.0   GFR: Estimated Creatinine Clearance: 82 mL/min (A) (by C-G formula based on SCr of 0.57 mg/dL (L)). Liver Function Tests: Recent Labs  Lab 04/03/24 1539 04/04/24 0526 04/05/24 2248  AST 18 10* 18  ALT 11 7 12   ALKPHOS 87 79 75  BILITOT 0.5 0.5 0.3  PROT 7.4 6.7 6.8  ALBUMIN 3.8 3.5 3.5   Coagulation Profile: Recent Labs  Lab 04/03/24 1539  INR 0.7*   BNP (last 3 results) Recent Labs    04/03/24 1539  PROBNP <50.0   CBG: Recent Labs  Lab 03/31/24 0953  GLUCAP 84   Sepsis Labs: Recent Labs  Lab 04/03/24 1628  LATICACIDVEN 1.6   Recent Results (from the past 240 hours)  Blood Culture (routine x 2)     Status: None (Preliminary result)   Collection Time: 04/03/24  3:30 PM   Specimen: BLOOD  Result Value Ref Range Status   Specimen Description   Final    BLOOD BLOOD LEFT ARM Performed at [facility], [address] [place], [place] [address]    Special Requests   Final    BOTTLES DRAWN AEROBIC AND ANAEROBIC Blood Culture results may not be optimal due to an inadequate volume of blood received in culture bottles Performed at [facility], [address] [place], [place] [address]    Culture   Final    NO GROWTH 3 DAYS Performed at [facility], [address], [place], [place] [address]    Report Status PENDING  Incomplete  Blood Culture (routine x 2)     Status: None (Preliminary result)   Collection Time: 04/03/24  3:56 PM   Specimen: BLOOD  Result Value Ref Range Status   Specimen Description   Final    BLOOD BLOOD RIGHT ARM Performed at [facility], [address] [place], [place] [address]    Special Requests   Final    BOTTLES DRAWN AEROBIC AND ANAEROBIC Blood Culture results may not be optimal due to an inadequate volume of blood received in culture bottles Performed at [facility], [address] [place], [place] [address]     Culture   Final    NO GROWTH 3 DAYS Performed at [facility], [address] [place], [place] [address]    Report Status PENDING  Incomplete  Respiratory (~20 pathogens) panel by PCR     Status: None   Collection Time: 04/03/24  9:01 PM   Specimen: Anterior Nasal Swab; Respiratory  Result Value Ref Range Status   Adenovirus NOT DETECTED NOT DETECTED Final   Coronavirus 229E NOT DETECTED NOT DETECTED Final    Comment: (  NOTE) The Coronavirus on the Respiratory Panel, DOES NOT test for the novel  Coronavirus (2019 nCoV)    Coronavirus HKU1 NOT DETECTED NOT DETECTED Final   Coronavirus NL63 NOT DETECTED NOT DETECTED Final   Coronavirus OC43 NOT DETECTED NOT DETECTED Final   Metapneumovirus NOT DETECTED NOT DETECTED Final   Rhinovirus / Enterovirus NOT DETECTED NOT DETECTED Final   Influenza A NOT DETECTED NOT DETECTED Final   Influenza B NOT DETECTED NOT DETECTED Final   Parainfluenza Virus 1 NOT DETECTED NOT DETECTED Final   Parainfluenza Virus 2 NOT DETECTED NOT DETECTED Final   Parainfluenza Virus 3 NOT DETECTED NOT DETECTED Final   Parainfluenza Virus 4 NOT DETECTED NOT DETECTED Final   Respiratory Syncytial Virus NOT DETECTED NOT DETECTED Final   Bordetella pertussis NOT DETECTED NOT DETECTED Final   Bordetella Parapertussis NOT DETECTED NOT DETECTED Final   Chlamydophila pneumoniae NOT DETECTED NOT DETECTED Final   Mycoplasma pneumoniae NOT DETECTED NOT DETECTED Final    Comment: Performed at [facility], [address] [place], [place] [address]  Resp panel by RT-PCR (RSV, Flu A&B, Covid) Anterior Nasal Swab     Status: None   Collection Time: 04/03/24  9:01 PM   Specimen: Anterior Nasal Swab  Result Value Ref Range Status   SARS Coronavirus 2 by RT PCR NEGATIVE NEGATIVE Final    Comment: (NOTE) SARS-CoV-2 target nucleic acids are NOT DETECTED.  The SARS-CoV-2 RNA is generally detectable in upper respiratory specimens during the acute phase of infection.  The lowest concentration of SARS-CoV-2 viral copies this assay can detect is 138 copies/mL. A negative result does not preclude SARS-Cov-2 infection and should not be used as the sole basis for treatment or other patient management decisions. A negative result may occur with  improper specimen collection/handling, submission of specimen other than nasopharyngeal swab, presence of viral mutation(s) within the areas targeted by this assay, and inadequate number of viral copies(<138 copies/mL). A negative result must be combined with clinical observations, patient history, and epidemiological information. The expected result is Negative.  Fact Sheet for Patients:  bloggercourse.com  Fact Sheet for Healthcare Providers:  seriousbroker.it  This test is no t yet approved or cleared by the [place]  FDA and  has been authorized for detection and/or diagnosis of SARS-CoV-2 by FDA under an Emergency Use Authorization (EUA). This EUA will remain  in effect (meaning this test can be used) for the duration of the COVID-19 declaration under Section 564(b)(1) of the Act, 21 U.S.C.section 360bbb-3(b)(1), unless the authorization is terminated  or revoked sooner.       Influenza A by PCR NEGATIVE NEGATIVE Final   Influenza B by PCR NEGATIVE NEGATIVE Final    Comment: (NOTE) The Xpert Xpress SARS-CoV-2/FLU/RSV plus assay is intended as an aid in the diagnosis of influenza from Nasopharyngeal swab specimens and should not be used as a sole basis for treatment. Nasal washings and aspirates are unacceptable for Xpert Xpress SARS-CoV-2/FLU/RSV testing.  Fact Sheet for Patients: bloggercourse.com  Fact Sheet for Healthcare Providers: seriousbroker.it  This test is not yet approved or cleared by the [place]  FDA and has been authorized for detection and/or diagnosis of SARS-CoV-2 by FDA  under an Emergency Use Authorization (EUA). This EUA will remain in effect (meaning this test can be used) for the duration of the COVID-19 declaration under Section 564(b)(1) of the Act, 21 U.S.C. section 360bbb-3(b)(1), unless the authorization is terminated or revoked.     Resp Syncytial Virus  by PCR NEGATIVE NEGATIVE Final    Comment: (NOTE) Fact Sheet for Patients: bloggercourse.com  Fact Sheet for Healthcare Providers: seriousbroker.it  This test is not yet approved or cleared by the [place]  FDA and has been authorized for detection and/or diagnosis of SARS-CoV-2 by FDA under an Emergency Use Authorization (EUA). This EUA will remain in effect (meaning this test can be used) for the duration of the COVID-19 declaration under Section 564(b)(1) of the Act, 21 U.S.C. section 360bbb-3(b)(1), unless the authorization is terminated or revoked.  Performed at [facility], [address], [place], [place] [address]   Expectorated Sputum Assessment w Gram Stain, Rflx to Resp Cult     Status: None   Collection Time: 04/04/24  8:46 AM   Specimen: Sputum  Result Value Ref Range Status   Specimen Description SPUTUM  Final   Special Requests NONE  Final   Sputum evaluation   Final    THIS SPECIMEN IS ACCEPTABLE FOR SPUTUM CULTURE Performed at [facility], [address] [place], [place] [address]    Report Status 04/04/2024 FINAL  Final  Culture, Respiratory w Gram Stain     Status: None (Preliminary result)   Collection Time: 04/04/24  8:46 AM   Specimen: SPU  Result Value Ref Range Status   Specimen Description   Final    SPUTUM Performed at [facility], [address], [place], [place] [address]    Special Requests   Final    NONE Reflexed from [id number] Performed at [facility], [address] [place], [place] [address]    Gram Stain NO WBC  SEEN RARE GRAM POSITIVE COCCI   Final   Culture   Final    CULTURE REINCUBATED FOR BETTER GROWTH Performed at [facility], [address] [place], [place] [address]    Report Status PENDING  Incomplete    Radiology Studies: No results found.  Scheduled Meds:  amoxicillin -clavulanate  1 tablet Oral Q12H   apixaban   5 mg Oral BID   arformoterol   15 mcg Nebulization BID   atorvastatin   10 mg Oral QHS   benztropine   1 mg Oral BID   budesonide  (PULMICORT ) nebulizer solution  0.25 mg Nebulization BID   carvedilol   6.25 mg Oral BID WC   doxycycline   100 mg Oral Daily   famotidine   40 mg Oral BID   ipratropium-albuterol   3 mL Nebulization Q4H   loratadine   10 mg Oral Daily   pneumococcal 20-valent conjugate vaccine  0.5 mL Intramuscular Tomorrow-1000   polyethylene glycol  17 g Oral Daily   predniSONE   40 mg Oral Q breakfast   risperiDONE   2 mg Oral BID   tamsulosin   0.4 mg Oral Daily   Continuous Infusions:   LOS: 3 days   Time spent: 55 min  Elsie JAYSON Montclair, DO [facility]  If 7PM-7AM, please contact night-coverage [email or web address]  04/06/2024, 8:23 AM      "

## 2024-04-06 NOTE — Progress Notes (Signed)
 SATURATION QUALIFICATIONS: (This note is used to comply with regulatory documentation for home oxygen )  Patient Saturations on Room Air at Rest = 93%  Patient Saturations on Room Air while Ambulating = 90%  Patient Saturations on 2 Liters of oxygen  while Ambulating = 98%  Please briefly explain why patient needs home oxygen :  Lung CA, COPD, Home 02 2L

## 2024-04-06 NOTE — Plan of Care (Signed)

## 2024-04-06 NOTE — Plan of Care (Signed)
   Problem: Safety: Goal: Ability to remain free from injury will improve Outcome: Progressing   Problem: Skin Integrity: Goal: Risk for impaired skin integrity will decrease Outcome: Progressing

## 2024-04-07 ENCOUNTER — Other Ambulatory Visit: Payer: Self-pay

## 2024-04-07 DIAGNOSIS — J441 Chronic obstructive pulmonary disease with (acute) exacerbation: Secondary | ICD-10-CM | POA: Diagnosis not present

## 2024-04-07 LAB — CULTURE, RESPIRATORY W GRAM STAIN: Gram Stain: NONE SEEN

## 2024-04-07 MED ORDER — PREDNISONE 20 MG PO TABS: 40.0000 mg | ORAL_TABLET | Freq: Every day | ORAL | 0 refills | Status: AC

## 2024-04-07 NOTE — Plan of Care (Signed)
   Problem: Clinical Measurements: Goal: Ability to maintain clinical measurements within normal limits will improve Outcome: Progressing Goal: Will remain free from infection Outcome: Progressing Goal: Diagnostic test results will improve Outcome: Progressing Goal: Respiratory complications will improve Outcome: Progressing Goal: Cardiovascular complication will be avoided Outcome: Progressing   Problem: Safety: Goal: Ability to remain free from injury will improve Outcome: Progressing

## 2024-04-07 NOTE — Progress Notes (Addendum)
 Attempted to call patients sister [name] to review discharge summary and arrange transportation. Unable to reach her at both numbers listed in chart. Will attempt again   1140: attempted to reach out to sister, no answer VM left to call back. 1145:attempted to call patients brother- Rico with no answer. 1230:attempted to call patients sister- no answer  [name] called back, she will be on her way to come get patient within the hour

## 2024-04-07 NOTE — Discharge Summary (Signed)
 Physician Discharge Summary  Blake Mcknight FMW:[id number] DOB: 06-19-1955 DOA: 04/03/2024  PCP: [name], MD  Admit date: 04/03/2024 Discharge date: 04/07/2024  Admitted From: Home Disposition: Home  Recommendations for Outpatient Follow-up:  Follow up with PCP in 1-2 weeks With pulmonology as scheduled  Home Health: None Equipment/Devices: Continue oxygen  at 2 L, prior baseline  Discharge Condition: Stable CODE STATUS: Full Diet recommendation: As tolerated low-fat low-salt diet  Brief/Interim Summary: 69 year old male with history of COPD on home oxygen  2 L, HTN, prior PE on Eliquis , recently diagnosed lung cancer, tobacco use in remission, schizophrenia comes into the hospital with shortness of breath.  Patient's sister tells me that he had the port for chemotherapy placed last week, and ever since then he has been having fatigue, weakness, has been having increased shortness of breath, has been ambulating less and refusing food.  He has also been having a cough but there is a chronic component to his cough.  Patient admitted as above with worsening respiratory status cough and questionable sputum production more than baseline.  He was noted to be acutely more hypoxic than baseline, continues to require more than 2 L nasal cannula at intake which is now improved drastically over the past 48 hours.  Patient now able to ambulate on home 2 L nasal cannula without any worsening dyspnea or hypoxia.  Medication adjustment as below, continue steroids as discussed, continue inhalers and nebulizer treatments as previously scheduled.  Discharge Diagnoses:  Active Problems:   COPD with acute exacerbation (HCC)  Acute COPD exacerbation, with acute on chronic hypoxic respiratory failure - Continue Brovana , Pulmicort , steroids, nebs - Symptoms appeared soon after his port placement, cannot rule out aspiration during his sedation but no overt infection noted, completed antibiotic  course during stay.  Stage IV non-small cell lung cancer, squamous cell carcinoma Recent diagnosis with Dr. Sherrod Scull placed with plan for chemotherapy initiation 1/19 currently on hold until current illness is resolved per oncology   History of PE-continue Eliquis   Hyperlipidemia-continue statin Schizophrenia -at baseline alert to self, sister makes medical decisions.  Continue home medications Tobacco use-in remission BPH-continue Flomax   Discharge Instructions  Discharge Instructions     Call MD for:  difficulty breathing, headache or visual disturbances   Complete by: As directed    Call MD for:  extreme fatigue   Complete by: As directed    Call MD for:  hives   Complete by: As directed    Call MD for:  persistant dizziness or light-headedness   Complete by: As directed    Call MD for:  persistant nausea and vomiting   Complete by: As directed    Call MD for:  severe uncontrolled pain   Complete by: As directed    Call MD for:  temperature >100.4   Complete by: As directed    Increase activity slowly   Complete by: As directed       Allergies as of 04/07/2024   No Known Allergies      Medication List     STOP taking these medications    amLODipine  5 MG tablet Commonly known as: NORVASC    methocarbamol  750 MG tablet Commonly known as: ROBAXIN    prochlorperazine  10 MG tablet Commonly known as: COMPAZINE        TAKE these medications    benztropine  1 MG tablet Commonly known as: COGENTIN  Take 1 tablet (1 mg total) by mouth 2 (two) times daily. The timing of this medication is very important.  acetaminophen  500 MG tablet Commonly known as: TYLENOL  Take 2 tablets (1,000 mg total) by mouth every 8 (eight) hours. What changed:  when to take this reasons to take this   albuterol  108 (90 Base) MCG/ACT inhaler Commonly known as: VENTOLIN  HFA Inhale 2 puffs into the lungs every 4 (four) hours as needed for wheezing or shortness of breath.    albuterol  (2.5 MG/3ML) 0.083% nebulizer solution Commonly known as: PROVENTIL  Take 2.5 mg by nebulization every 6 (six) hours as needed for shortness of breath or wheezing.   apixaban  5 MG Tabs tablet Commonly known as: ELIQUIS  Take 1 tablet (5 mg total) by mouth 2 (two) times daily.   ARTIFICIAL TEAR SOLUTION OP Place 1 drop into both eyes 3 (three) times daily as needed (for irritation).   atorvastatin  10 MG tablet Commonly known as: LIPITOR Take 1 tablet (10 mg total) by mouth at bedtime.   carvedilol  3.125 MG tablet Commonly known as: COREG  Take 2 tablets (6.25 mg total) by mouth 2 (two) times daily with a meal. What changed: when to take this   cetirizine 10 MG tablet Commonly known as: ZYRTEC Take 10 mg by mouth daily as needed for allergies or rhinitis.   cyanocobalamin  1000 MCG tablet Take 1,000 mcg by mouth daily. What changed: when to take this   famotidine  40 MG tablet Commonly known as: PEPCID  Take 40 mg by mouth 2 (two) times daily.   feeding supplement (GLUCERNA SHAKE) Liqd Take 237 mLs by mouth 3 (three) times daily between meals.   fluticasone  50 MCG/ACT nasal spray Commonly known as: FLONASE Place 1 spray into both nostrils daily.   guaiFENesin  600 MG 12 hr tablet Commonly known as: MUCINEX  Take 1 tablet (600 mg total) by mouth 2 (two) times daily as needed. What changed: reasons to take this   hydrOXYzine  25 MG tablet Commonly known as: ATARAX  Take 1 tablet (25 mg total) by mouth 3 (three) times daily as needed for anxiety.   ipratropium-albuterol  0.5-2.5 (3) MG/3ML Soln Commonly known as: DUONEB Take 3 mLs by nebulization every 4 (four) hours.   Januvia 100 MG tablet Generic drug: sitaGLIPtin Take 100 mg by mouth every evening.   lidocaine  5 % Commonly known as: LIDODERM  Place 1 patch onto the skin daily. Remove & Discard patch within 12 hours or as directed by MD What changed:  when to take this reasons to take this    lidocaine -prilocaine  cream Commonly known as: EMLA  Apply to affected area once   mirtazapine  30 MG tablet Commonly known as: REMERON  Take 1 tablet (30 mg total) by mouth at bedtime as needed (insomnia).   multivitamin Tabs tablet Take 1 tablet by mouth 3 (three) times a week.   ondansetron  8 MG tablet Commonly known as: Zofran  Take 1 tablet (8 mg total) by mouth every 8 (eight) hours as needed for nausea or vomiting. Start on the third day after carboplatin.   OXYGEN  Inhale 2 L/min into the lungs continuous.   polyethylene glycol 17 g packet Commonly known as: MIRALAX  / GLYCOLAX  Take 17 g by mouth daily. What changed: when to take this   predniSONE  20 MG tablet Commonly known as: DELTASONE  Take 2 tablets (40 mg total) by mouth daily with breakfast for 1 day. What changed:  how much to take how to take this when to take this additional instructions Another medication with the same name was removed. Continue taking this medication, and follow the directions you see here.   risperiDONE  2  MG tablet Commonly known as: RISPERDAL  Take 1 tablet (2 mg total) by mouth 2 (two) times daily.   sodium chloride  0.65 % Soln nasal spray Commonly known as: OCEAN Place 1 spray into both nostrils as needed for congestion.   tamsulosin  0.4 MG Caps capsule Commonly known as: FLOMAX  Take 0.4 mg by mouth in the morning and at bedtime.   traMADol  50 MG tablet Commonly known as: ULTRAM  Take 1 tablet (50 mg total) by mouth every 6 (six) hours as needed. What changed: reasons to take this   Trelegy Ellipta  100-62.5-25 MCG/ACT Aepb Generic drug: Fluticasone -Umeclidin-Vilant Inhale 1 puff into the lungs daily.   Vitamin D3 50 MCG (2000 UT) Tabs Take 2,000-4,000 Units by mouth 3 (three) times a week.        Allergies[1]  Consultations: None  Procedures/Studies: CT Angio Chest PE W and/or Wo Contrast Result Date: 04/03/2024 EXAM: CTA CHEST 04/03/2024 04:43:05 PM TECHNIQUE: CTA  of the chest was performed after the administration of intravenous contrast. Multiplanar reformatted images are provided for review. MIP images are provided for review. Automated exposure control, iterative reconstruction, and/or weight based adjustment of the mA/kV was utilized to reduce the radiation dose to as low as reasonably achievable. COMPARISON: CT angiogram chest 02/06/2024. CLINICAL HISTORY: Pulmonary embolism (PE) suspected, low to intermediate prob, neg D-dimer. FINDINGS: PULMONARY ARTERIES: Pulmonary arteries are adequately opacified for evaluation. Chronic appearing right lower lobe pulmonary emboli appear unchanged at the segmental and subsegmental levels. No new pulmonary embolism identified. Main pulmonary artery is normal in caliber. MEDIASTINUM: Coronary and aortic atherosclerotic calcifications are noted. Right chest port in place with distal catheter ending in the distal SVC. The heart and pericardium demonstrate no acute abnormality. There is no acute abnormality of the thoracic aorta. LYMPH NODES: Subcarinal lymphadenopathy measuring 3.0 x 4.1 cm, unchanged. No hilar or axillary lymphadenopathy. LUNGS AND PLEURA: Severe emphysema and bullae are again noted in both lungs. Masslike consolidation in the right lower lobe extending to the right hilum measuring approximately 4.9 x 4.6 cm that is not significantly changed. There is plugging/cutoff of more peripheral right lower lobe bronchi in the region of the masslike consolidation similar to prior. Nodular density in the left upper lobe triangular in shape measuring up to 9 mm on image 12/97 appears unchanged. Mucous plugging throughout the left lower lobe with some ill defined nodular densities appears unchanged. There is a trace right pleural effusion, unchanged. No pneumothorax. No new focal lung infiltrate. UPPER ABDOMEN: The gallbladder surgically absent. Hypodense lesion in the inferior right lobe of the liver measures 2.7 cm and was  incompletely imaged on the prior study. Left renal cysts are again seen. SOFT TISSUES AND BONES: Disc deformities of T6, T7 and T8 appear unchanged. No acute soft tissue abnormality. IMPRESSION: 1. No new pulmonary embolism is identified, with unchanged chronic right lower lobe pulmonary emboli at the segmental and subsegmental levels. 2. Severe emphysema with bullae in both lungs. 3. Unchanged masslike consolidation in the right lower lobe extending to the right hilum, measuring approximately 4.9 x 4.6 cm. 4. Unchanged subcarinal lymphadenopathy measuring 3.0 x 4.1 cm. Electronically signed by: Greig Pique MD 04/03/2024 05:51 PM EST RP Workstation: [id number]   DG Chest Port 1 View Result Date: 04/03/2024 EXAM: 2 VIEW(S) XRAY OF THE CHEST 04/03/2024 03:50:00 PM COMPARISON: 11/27/2023 CLINICAL HISTORY: sob, recent R-sided port placement FINDINGS: LINES, TUBES AND DEVICES: Right chest Port-A-Cath in place with tip projecting over the superior cavoatrial junction. LUNGS AND PLEURA: Small right  pleural effusion. Increased patchy opacities at the right lung base. Multiple bulla in the right lung base and right upper lobe. No pneumothorax. HEART AND MEDIASTINUM: No acute abnormality of the cardiac and mediastinal silhouettes. BONES AND SOFT TISSUES: No acute osseous abnormality. IMPRESSION: 1. Increased patchy opacities at the right lung base. 2. Right chest Port-A-Cath with tip at the superior cavoatrial junction. 3. Small right pleural effusion. 4. Multiple bulla in the right lung base and right upper lobe. Electronically signed by: Greig Pique MD 04/03/2024 04:34 PM EST RP Workstation: [id number]   IR IMAGING GUIDED PORT INSERTION Result Date: 03/31/2024 INDICATION: 69 year old male with history of advanced stage lung cancer requiring central venous access for chemotherapy administration. EXAM: IMPLANTED PORT A CATH PLACEMENT WITH ULTRASOUND AND FLUOROSCOPIC GUIDANCE COMPARISON:  None Available. MEDICATIONS:  None ANESTHESIA/SEDATION: Moderate (conscious) sedation was employed during this procedure. A total of Versed  2 mg and Fentanyl  100 mcg was administered intravenously. Moderate Sedation Time: 17 minutes. The patient's level of consciousness and vital signs were monitored continuously by radiology nursing throughout the procedure under my direct supervision. CONTRAST:  None FLUOROSCOPY TIME:  One mGy reference air kerma COMPLICATIONS: None immediate. PROCEDURE: The procedure, risks, benefits, and alternatives were explained to the patient. Questions regarding the procedure were encouraged and answered. The patient understands and consents to the procedure. The right neck and chest were prepped with chlorhexidine  in a sterile fashion, and a sterile drape was applied covering the operative field. Maximum barrier sterile technique with sterile gowns and gloves were used for the procedure. A timeout was performed prior to the initiation of the procedure. Ultrasound was used to examine the jugular vein which was compressible and free of internal echoes. A skin marker was used to demarcate the planned venotomy and port pocket incision sites. Local anesthesia was provided to these sites and the subcutaneous tunnel track with 1% lidocaine  with 1:100,000 epinephrine . A small incision was created at the jugular access site and blunt dissection was performed of the subcutaneous tissues. Under ultrasound guidance, the jugular vein was accessed with a 21 ga micropuncture needle and an 0.018 wire was inserted to the superior vena cava. Real-time ultrasound guidance was utilized for vascular access including the acquisition of a permanent ultrasound image documenting patency of the accessed vessel. A 5 Fr micopuncture set was then used, through which a 0.035 Rosen wire was passed under fluoroscopic guidance into the inferior vena cava. An 8 Fr dilator was then placed over the wire. A subcutaneous port pocket was then created  along the upper chest wall utilizing a combination of sharp and blunt dissection. The pocket was irrigated with sterile saline, packed with gauze, and observed for hemorrhage. A single lumen clear view power injectable port was chosen for placement. The 8 Fr catheter was tunneled from the port pocket site to the venotomy incision. The port was placed in the pocket. The external catheter was trimmed to appropriate length. The dilator was exchanged for an 8 Fr peel-away sheath under fluoroscopic guidance. The catheter was then placed through the sheath and the sheath was removed. Final catheter positioning was confirmed and documented with a fluoroscopic spot radiograph. The port was accessed with a Huber needle, aspirated, and flushed with heparinized saline. The deep dermal layer of the port pocket incision was closed with interrupted 3-0 Vicryl suture. Dermabond was then placed over the port pocket and neck incisions. The patient tolerated the procedure well without immediate post procedural complication. FINDINGS: After catheter placement, the tip lies  within the superior cavoatrial junction. The catheter aspirates and flushes normally and is ready for immediate use. IMPRESSION: Successful placement of a power injectable Port-A-Cath via the right internal jugular vein. The catheter is ready for immediate use. Ester Sides, MD [facility] Electronically Signed   By: Ester Sides M.D.   On: 03/31/2024 12:36     Subjective: No acute issues or events overnight, respiratory status improving drastically, patient reports feeling markedly better than previous.   Discharge Exam: Vitals:   04/07/24 0943 04/07/24 1019  BP: 124/76   Pulse: 75   Resp:    Temp:    SpO2:  97%   Vitals:   04/07/24 0734 04/07/24 0735 04/07/24 0943 04/07/24 1019  BP:   124/76   Pulse:   75   Resp:      Temp:      TempSrc:      SpO2: 96% 96%  97%  Weight:       Height:        General: Pt is alert, awake, not in acute distress Cardiovascular: RRR, S1/S2 +, no rubs, no gallops Respiratory: CTA bilaterally, no wheezing, no rhonchi Abdominal: Soft, NT, ND, bowel sounds + Extremities: no edema, no cyanosis    The results of significant diagnostics from this hospitalization (including imaging, microbiology, ancillary and laboratory) are listed below for reference.     Microbiology: Recent Results (from the past 240 hours)  Blood Culture (routine x 2)     Status: None (Preliminary result)   Collection Time: 04/03/24  3:30 PM   Specimen: BLOOD  Result Value Ref Range Status   Specimen Description   Final    BLOOD BLOOD LEFT ARM Performed at [facility], [address], [place], [place] [address]    Special Requests   Final    BOTTLES DRAWN AEROBIC AND ANAEROBIC Blood Culture results may not be optimal due to an inadequate volume of blood received in culture bottles Performed at [facility], [address], [place], [place] [address]    Culture   Final    NO GROWTH 4 DAYS Performed at [facility], [address], [place], [place] [address]    Report Status PENDING  Incomplete  Blood Culture (routine x 2)     Status: None (Preliminary result)   Collection Time: 04/03/24  3:56 PM   Specimen: BLOOD  Result Value Ref Range Status   Specimen Description   Final    BLOOD BLOOD RIGHT ARM Performed at [facility], [address], [place], [place] [address]    Special Requests   Final    BOTTLES DRAWN AEROBIC AND ANAEROBIC Blood Culture results may not be optimal due to an inadequate volume of blood received in culture bottles Performed at [facility], [address], [place], [place] [address]    Culture   Final    NO GROWTH 4 DAYS Performed at [facility], [address], [place], [place] [address]    Report Status PENDING  Incomplete   Respiratory (~20 pathogens) panel by PCR     Status: None   Collection Time: 04/03/24  9:01 PM   Specimen: Anterior Nasal Swab; Respiratory  Result Value Ref Range Status   Adenovirus NOT DETECTED NOT DETECTED Final   Coronavirus 229E NOT DETECTED NOT DETECTED Final    Comment: (NOTE) The Coronavirus on the Respiratory Panel, DOES NOT test for the  novel  Coronavirus (2019 nCoV)    Coronavirus HKU1 NOT DETECTED NOT DETECTED Final   Coronavirus NL63 NOT DETECTED NOT DETECTED Final   Coronavirus OC43 NOT DETECTED NOT DETECTED Final   Metapneumovirus NOT DETECTED NOT DETECTED Final   Rhinovirus / Enterovirus NOT DETECTED NOT DETECTED Final   Influenza A NOT DETECTED NOT DETECTED Final   Influenza B NOT DETECTED NOT DETECTED Final   Parainfluenza Virus 1 NOT DETECTED NOT DETECTED Final   Parainfluenza Virus 2 NOT DETECTED NOT DETECTED Final   Parainfluenza Virus 3 NOT DETECTED NOT DETECTED Final   Parainfluenza Virus 4 NOT DETECTED NOT DETECTED Final   Respiratory Syncytial Virus NOT DETECTED NOT DETECTED Final   Bordetella pertussis NOT DETECTED NOT DETECTED Final   Bordetella Parapertussis NOT DETECTED NOT DETECTED Final   Chlamydophila pneumoniae NOT DETECTED NOT DETECTED Final   Mycoplasma pneumoniae NOT DETECTED NOT DETECTED Final    Comment: Performed at [facility], [address], [place], [place] [address]  Resp panel by RT-PCR (RSV, Flu A&B, Covid) Anterior Nasal Swab     Status: None   Collection Time: 04/03/24  9:01 PM   Specimen: Anterior Nasal Swab  Result Value Ref Range Status   SARS Coronavirus 2 by RT PCR NEGATIVE NEGATIVE Final    Comment: (NOTE) SARS-CoV-2 target nucleic acids are NOT DETECTED.  The SARS-CoV-2 RNA is generally detectable in upper respiratory specimens during the acute phase of infection. The lowest concentration of SARS-CoV-2 viral copies this assay can detect is 138 copies/mL. A negative result does not preclude SARS-Cov-2 infection and  should not be used as the sole basis for treatment or other patient management decisions. A negative result may occur with  improper specimen collection/handling, submission of specimen other than nasopharyngeal swab, presence of viral mutation(s) within the areas targeted by this assay, and inadequate number of viral copies(<138 copies/mL). A negative result must be combined with clinical observations, patient history, and epidemiological information. The expected result is Negative.  Fact Sheet for Patients:  bloggercourse.com  Fact Sheet for Healthcare Providers:  seriousbroker.it  This test is no t yet approved or cleared by the [place]  FDA and  has been authorized for detection and/or diagnosis of SARS-CoV-2 by FDA under an Emergency Use Authorization (EUA). This EUA will remain  in effect (meaning this test can be used) for the duration of the COVID-19 declaration under Section 564(b)(1) of the Act, 21 U.S.C.section 360bbb-3(b)(1), unless the authorization is terminated  or revoked sooner.       Influenza A by PCR NEGATIVE NEGATIVE Final   Influenza B by PCR NEGATIVE NEGATIVE Final    Comment: (NOTE) The Xpert Xpress SARS-CoV-2/FLU/RSV plus assay is intended as an aid in the diagnosis of influenza from Nasopharyngeal swab specimens and should not be used as a sole basis for treatment. Nasal washings and aspirates are unacceptable for Xpert Xpress SARS-CoV-2/FLU/RSV testing.  Fact Sheet for Patients: bloggercourse.com  Fact Sheet for Healthcare Providers: seriousbroker.it  This test is not yet approved or cleared by the [place]  FDA and has been authorized for detection and/or diagnosis of SARS-CoV-2 by FDA under an Emergency Use Authorization (EUA). This EUA will remain in effect (meaning this test can be used) for the duration of the COVID-19 declaration  under Section 564(b)(1) of the Act, 21 U.S.C. section 360bbb-3(b)(1), unless the authorization is terminated or revoked.     Resp Syncytial Virus by PCR NEGATIVE NEGATIVE Final    Comment: (NOTE) Fact Sheet  for Patients: bloggercourse.com  Fact Sheet for Healthcare Providers: seriousbroker.it  This test is not yet approved or cleared by the [place]  FDA and has been authorized for detection and/or diagnosis of SARS-CoV-2 by FDA under an Emergency Use Authorization (EUA). This EUA will remain in effect (meaning this test can be used) for the duration of the COVID-19 declaration under Section 564(b)(1) of the Act, 21 U.S.C. section 360bbb-3(b)(1), unless the authorization is terminated or revoked.  Performed at [facility], [address], [place], [place] [address]   Expectorated Sputum Assessment w Gram Stain, Rflx to Resp Cult     Status: None   Collection Time: 04/04/24  8:46 AM   Specimen: Sputum  Result Value Ref Range Status   Specimen Description SPUTUM  Final   Special Requests NONE  Final   Sputum evaluation   Final    THIS SPECIMEN IS ACCEPTABLE FOR SPUTUM CULTURE Performed at [facility], [address] [place], [place] [address]    Report Status 04/04/2024 FINAL  Final  Culture, Respiratory w Gram Stain     Status: None   Collection Time: 04/04/24  8:46 AM   Specimen: SPU  Result Value Ref Range Status   Specimen Description   Final    SPUTUM Performed at [facility], [address], [place], [place] [address]    Special Requests   Final    NONE Reflexed from [id number] Performed at [facility], [address] [place], [place] [address]    Gram Stain   Final    NO WBC SEEN RARE GRAM POSITIVE COCCI Performed at [facility], [address] [place], [place] [address]    Culture MODERATE PSEUDOMONAS AERUGINOSA  Final    Report Status 04/07/2024 FINAL  Final   Organism ID, Bacteria PSEUDOMONAS AERUGINOSA  Final      Susceptibility   Pseudomonas aeruginosa - MIC*    MEROPENEM <=0.25 SENSITIVE Sensitive     CIPROFLOXACIN 0.5 SENSITIVE Sensitive     IMIPENEM 2 SENSITIVE Sensitive     PIP/TAZO Value in next row Sensitive      <=4 SENSITIVEThis is a modified FDA-approved test that has been validated and its performance characteristics determined by the reporting laboratory.  This laboratory is certified under the Clinical Laboratory Improvement Amendments CLIA as qualified to perform high complexity clinical laboratory testing.    CEFEPIME  Value in next row Sensitive      <=4 SENSITIVEThis is a modified FDA-approved test that has been validated and its performance characteristics determined by the reporting laboratory.  This laboratory is certified under the Clinical Laboratory Improvement Amendments CLIA as qualified to perform high complexity clinical laboratory testing.    CEFTAZIDIME/AVIBACTAM Value in next row Sensitive      <=4 SENSITIVEThis is a modified FDA-approved test that has been validated and its performance characteristics determined by the reporting laboratory.  This laboratory is certified under the Clinical Laboratory Improvement Amendments CLIA as qualified to perform high complexity clinical laboratory testing.    CEFTOLOZANE/TAZOBACTAM Value in next row Sensitive      <=4 SENSITIVEThis is a modified FDA-approved test that has been validated and its performance characteristics determined by the reporting laboratory.  This laboratory is certified under the Clinical Laboratory Improvement Amendments CLIA as qualified to perform high complexity clinical laboratory testing.    TOBRAMYCIN Value in next row Sensitive      <=4 SENSITIVEThis is a modified FDA-approved test that has  been validated and its performance characteristics determined by the reporting laboratory.  This laboratory is certified under the  Clinical Laboratory Improvement Amendments CLIA as qualified to perform high complexity clinical laboratory testing.    CEFTAZIDIME Value in next row Sensitive      <=4 SENSITIVEThis is a modified FDA-approved test that has been validated and its performance characteristics determined by the reporting laboratory.  This laboratory is certified under the Clinical Laboratory Improvement Amendments CLIA as qualified to perform high complexity clinical laboratory testing.    * MODERATE PSEUDOMONAS AERUGINOSA     Labs: BNP (last 3 results) Recent Labs    11/04/23 0606 11/27/23 0155 02/06/24 2015  BNP 26.3 52.7 54.9   Basic Metabolic Panel: Recent Labs  Lab 04/03/24 1539 04/03/24 2238 04/04/24 0526 04/05/24 2248  NA 130*  --  133* 135  K 4.2  --  4.0 4.4  CL 95*  --  99 99  CO2 26  --  26 27  GLUCOSE 167*  --  102* 107*  BUN 5*  --  6* 11  CREATININE 0.62 0.62 0.58* 0.57*  CALCIUM  9.3  --  9.0 9.2  MG 2.9*  --   --  2.0   Liver Function Tests: Recent Labs  Lab 04/03/24 1539 04/04/24 0526 04/05/24 2248  AST 18 10* 18  ALT 11 7 12   ALKPHOS 87 79 75  BILITOT 0.5 0.5 0.3  PROT 7.4 6.7 6.8  ALBUMIN 3.8 3.5 3.5   No results for input(s): LIPASE, AMYLASE in the last 168 hours. No results for input(s): AMMONIA in the last 168 hours. CBC: Recent Labs  Lab 04/03/24 1539 04/03/24 2238 04/04/24 0526 04/05/24 2248  WBC 13.0* 9.5 8.9 9.8  NEUTROABS 9.4*  --   --   --   HGB 13.3 12.8* 11.9* 11.7*  HCT 40.5 38.6* 36.7* 35.2*  MCV 81.2 80.9 79.8* 79.3*  PLT 285 257 248 254   Cardiac Enzymes: No results for input(s): CKTOTAL, CKMB, CKMBINDEX, TROPONINI in the last 168 hours. BNP: Invalid input(s): POCBNP CBG: No results for input(s): GLUCAP in the last 168 hours.  D-Dimer No results for input(s): DDIMER in the last 72 hours. Hgb A1c No results for input(s): HGBA1C in the last 72 hours. Lipid Profile No results for input(s): CHOL, HDL,  LDLCALC, TRIG, CHOLHDL, LDLDIRECT in the last 72 hours. Thyroid function studies No results for input(s): TSH, T4TOTAL, T3FREE, THYROIDAB in the last 72 hours.  Invalid input(s): FREET3 Anemia work up No results for input(s): VITAMINB12, FOLATE, FERRITIN, TIBC, IRON, RETICCTPCT in the last 72 hours. Urinalysis    Component Value Date/Time   COLORURINE YELLOW 04/16/2023 0616   APPEARANCEUR CLEAR 04/16/2023 0616   LABSPEC 1.016 04/16/2023 0616   PHURINE 6.0 04/16/2023 0616   GLUCOSEU 150 (A) 04/16/2023 0616   HGBUR NEGATIVE 04/16/2023 0616   BILIRUBINUR NEGATIVE 04/16/2023 0616   KETONESUR NEGATIVE 04/16/2023 0616   PROTEINUR NEGATIVE 04/16/2023 0616   UROBILINOGEN 0.2 01/29/2019 1634   NITRITE NEGATIVE 04/16/2023 0616   LEUKOCYTESUR NEGATIVE 04/16/2023 0616   Sepsis Labs Recent Labs  Lab 04/03/24 1539 04/03/24 2238 04/04/24 0526 04/05/24 2248  WBC 13.0* 9.5 8.9 9.8   Microbiology Recent Results (from the past 240 hours)  Blood Culture (routine x 2)     Status: None (Preliminary result)   Collection Time: 04/03/24  3:30 PM   Specimen: BLOOD  Result Value Ref Range Status   Specimen Description   Final    BLOOD BLOOD  LEFT ARM Performed at [facility], [address] [place], [place] [address]    Special Requests   Final    BOTTLES DRAWN AEROBIC AND ANAEROBIC Blood Culture results may not be optimal due to an inadequate volume of blood received in culture bottles Performed at [facility], [address], [place], [place] [address]    Culture   Final    NO GROWTH 4 DAYS Performed at [facility], [address] [place], [place] [address]    Report Status PENDING  Incomplete  Blood Culture (routine x 2)     Status: None (Preliminary result)   Collection Time: 04/03/24  3:56 PM   Specimen: BLOOD  Result Value Ref Range Status   Specimen Description   Final    BLOOD BLOOD RIGHT ARM Performed  at [facility], [address] [place], [place] [address]    Special Requests   Final    BOTTLES DRAWN AEROBIC AND ANAEROBIC Blood Culture results may not be optimal due to an inadequate volume of blood received in culture bottles Performed at [facility], [address], [place], [place] [address]    Culture   Final    NO GROWTH 4 DAYS Performed at [facility], [address] [place], [place] [address]    Report Status PENDING  Incomplete  Respiratory (~20 pathogens) panel by PCR     Status: None   Collection Time: 04/03/24  9:01 PM   Specimen: Anterior Nasal Swab; Respiratory  Result Value Ref Range Status   Adenovirus NOT DETECTED NOT DETECTED Final   Coronavirus 229E NOT DETECTED NOT DETECTED Final    Comment: (NOTE) The Coronavirus on the Respiratory Panel, DOES NOT test for the novel  Coronavirus (2019 nCoV)    Coronavirus HKU1 NOT DETECTED NOT DETECTED Final   Coronavirus NL63 NOT DETECTED NOT DETECTED Final   Coronavirus OC43 NOT DETECTED NOT DETECTED Final   Metapneumovirus NOT DETECTED NOT DETECTED Final   Rhinovirus / Enterovirus NOT DETECTED NOT DETECTED Final   Influenza A NOT DETECTED NOT DETECTED Final   Influenza B NOT DETECTED NOT DETECTED Final   Parainfluenza Virus 1 NOT DETECTED NOT DETECTED Final   Parainfluenza Virus 2 NOT DETECTED NOT DETECTED Final   Parainfluenza Virus 3 NOT DETECTED NOT DETECTED Final   Parainfluenza Virus 4 NOT DETECTED NOT DETECTED Final   Respiratory Syncytial Virus NOT DETECTED NOT DETECTED Final   Bordetella pertussis NOT DETECTED NOT DETECTED Final   Bordetella Parapertussis NOT DETECTED NOT DETECTED Final   Chlamydophila pneumoniae NOT DETECTED NOT DETECTED Final   Mycoplasma pneumoniae NOT DETECTED NOT DETECTED Final    Comment: Performed at [facility], [address] [place], [place] [address]  Resp panel by RT-PCR (RSV, Flu A&B, Covid) Anterior Nasal Swab     Status:  None   Collection Time: 04/03/24  9:01 PM   Specimen: Anterior Nasal Swab  Result Value Ref Range Status   SARS Coronavirus 2 by RT PCR NEGATIVE NEGATIVE Final    Comment: (NOTE) SARS-CoV-2 target nucleic acids are NOT DETECTED.  The SARS-CoV-2 RNA is generally detectable in upper respiratory specimens during the acute phase of infection. The lowest concentration of SARS-CoV-2 viral copies this assay can detect is 138 copies/mL. A negative result does not preclude SARS-Cov-2 infection and should not be used as the sole basis for treatment or other patient management decisions. A negative result may occur  with  improper specimen collection/handling, submission of specimen other than nasopharyngeal swab, presence of viral mutation(s) within the areas targeted by this assay, and inadequate number of viral copies(<138 copies/mL). A negative result must be combined with clinical observations, patient history, and epidemiological information. The expected result is Negative.  Fact Sheet for Patients:  bloggercourse.com  Fact Sheet for Healthcare Providers:  seriousbroker.it  This test is no t yet approved or cleared by the [place]  FDA and  has been authorized for detection and/or diagnosis of SARS-CoV-2 by FDA under an Emergency Use Authorization (EUA). This EUA will remain  in effect (meaning this test can be used) for the duration of the COVID-19 declaration under Section 564(b)(1) of the Act, 21 U.S.C.section 360bbb-3(b)(1), unless the authorization is terminated  or revoked sooner.       Influenza A by PCR NEGATIVE NEGATIVE Final   Influenza B by PCR NEGATIVE NEGATIVE Final    Comment: (NOTE) The Xpert Xpress SARS-CoV-2/FLU/RSV plus assay is intended as an aid in the diagnosis of influenza from Nasopharyngeal swab specimens and should not be used as a sole basis for treatment. Nasal washings and aspirates are unacceptable  for Xpert Xpress SARS-CoV-2/FLU/RSV testing.  Fact Sheet for Patients: bloggercourse.com  Fact Sheet for Healthcare Providers: seriousbroker.it  This test is not yet approved or cleared by the [place]  FDA and has been authorized for detection and/or diagnosis of SARS-CoV-2 by FDA under an Emergency Use Authorization (EUA). This EUA will remain in effect (meaning this test can be used) for the duration of the COVID-19 declaration under Section 564(b)(1) of the Act, 21 U.S.C. section 360bbb-3(b)(1), unless the authorization is terminated or revoked.     Resp Syncytial Virus by PCR NEGATIVE NEGATIVE Final    Comment: (NOTE) Fact Sheet for Patients: bloggercourse.com  Fact Sheet for Healthcare Providers: seriousbroker.it  This test is not yet approved or cleared by the [place]  FDA and has been authorized for detection and/or diagnosis of SARS-CoV-2 by FDA under an Emergency Use Authorization (EUA). This EUA will remain in effect (meaning this test can be used) for the duration of the COVID-19 declaration under Section 564(b)(1) of the Act, 21 U.S.C. section 360bbb-3(b)(1), unless the authorization is terminated or revoked.  Performed at [facility], [address], [place], [place] [address]   Expectorated Sputum Assessment w Gram Stain, Rflx to Resp Cult     Status: None   Collection Time: 04/04/24  8:46 AM   Specimen: Sputum  Result Value Ref Range Status   Specimen Description SPUTUM  Final   Special Requests NONE  Final   Sputum evaluation   Final    THIS SPECIMEN IS ACCEPTABLE FOR SPUTUM CULTURE Performed at [facility], [address], [place], [place] [address]    Report Status 04/04/2024 FINAL  Final  Culture, Respiratory w Gram Stain     Status: None   Collection Time: 04/04/24  8:46 AM   Specimen: SPU  Result  Value Ref Range Status   Specimen Description   Final    SPUTUM Performed at [facility], [address] [place], [place] [address]    Special Requests   Final    NONE Reflexed from [id number] Performed at [facility], [address], [place], [place] [address]    Gram Stain   Final    NO WBC SEEN RARE GRAM POSITIVE COCCI Performed at [facility], [address], [place], [place]  [address]    Culture MODERATE PSEUDOMONAS AERUGINOSA  Final   Report Status 04/07/2024 FINAL  Final   Organism ID, Bacteria PSEUDOMONAS AERUGINOSA  Final      Susceptibility   Pseudomonas aeruginosa - MIC*    MEROPENEM <=0.25 SENSITIVE Sensitive     CIPROFLOXACIN 0.5 SENSITIVE Sensitive     IMIPENEM 2 SENSITIVE Sensitive     PIP/TAZO Value in next row Sensitive      <=4 SENSITIVEThis is a modified FDA-approved test that has been validated and its performance characteristics determined by the reporting laboratory.  This laboratory is certified under the Clinical Laboratory Improvement Amendments CLIA as qualified to perform high complexity clinical laboratory testing.    CEFEPIME  Value in next row Sensitive      <=4 SENSITIVEThis is a modified FDA-approved test that has been validated and its performance characteristics determined by the reporting laboratory.  This laboratory is certified under the Clinical Laboratory Improvement Amendments CLIA as qualified to perform high complexity clinical laboratory testing.    CEFTAZIDIME/AVIBACTAM Value in next row Sensitive      <=4 SENSITIVEThis is a modified FDA-approved test that has been validated and its performance characteristics determined by the reporting laboratory.  This laboratory is certified under the Clinical Laboratory Improvement Amendments CLIA as qualified to perform high complexity clinical laboratory testing.    CEFTOLOZANE/TAZOBACTAM Value in next row Sensitive      <=4 SENSITIVEThis is a modified  FDA-approved test that has been validated and its performance characteristics determined by the reporting laboratory.  This laboratory is certified under the Clinical Laboratory Improvement Amendments CLIA as qualified to perform high complexity clinical laboratory testing.    TOBRAMYCIN Value in next row Sensitive      <=4 SENSITIVEThis is a modified FDA-approved test that has been validated and its performance characteristics determined by the reporting laboratory.  This laboratory is certified under the Clinical Laboratory Improvement Amendments CLIA as qualified to perform high complexity clinical laboratory testing.    CEFTAZIDIME Value in next row Sensitive      <=4 SENSITIVEThis is a modified FDA-approved test that has been validated and its performance characteristics determined by the reporting laboratory.  This laboratory is certified under the Clinical Laboratory Improvement Amendments CLIA as qualified to perform high complexity clinical laboratory testing.    * MODERATE PSEUDOMONAS AERUGINOSA     Time coordinating discharge: Over 30 minutes  SIGNED:   Elsie JAYSON Montclair, DO [facility] 04/07/2024, 2:34 PM Pager   If 7PM-7AM, please contact night-coverage [email or web address]     [1] No Known Allergies

## 2024-04-07 NOTE — Plan of Care (Signed)
  Problem: Education: Goal: Knowledge of General Education information will improve Description: Including pain rating scale, medication(s)/side effects and non-pharmacologic comfort measures Outcome: Adequate for Discharge   Problem: Health Behavior/Discharge Planning: Goal: Ability to manage health-related needs will improve Outcome: Adequate for Discharge   Problem: Clinical Measurements: Goal: Ability to maintain clinical measurements within normal limits will improve Outcome: Adequate for Discharge Goal: Will remain free from infection Outcome: Adequate for Discharge Goal: Diagnostic test results will improve Outcome: Adequate for Discharge Goal: Respiratory complications will improve Outcome: Adequate for Discharge Goal: Cardiovascular complication will be avoided Outcome: Adequate for Discharge   Problem: Activity: Goal: Risk for activity intolerance will decrease Outcome: Adequate for Discharge   Problem: Nutrition: Goal: Adequate nutrition will be maintained Outcome: Adequate for Discharge   Problem: Coping: Goal: Level of anxiety will decrease Outcome: Adequate for Discharge   Problem: Elimination: Goal: Will not experience complications related to bowel motility Outcome: Adequate for Discharge Goal: Will not experience complications related to urinary retention Outcome: Adequate for Discharge   Problem: Pain Managment: Goal: General experience of comfort will improve and/or be controlled Outcome: Adequate for Discharge   Problem: Safety: Goal: Ability to remain free from injury will improve Outcome: Adequate for Discharge   Problem: Skin Integrity: Goal: Risk for impaired skin integrity will decrease Outcome: Adequate for Discharge   Problem: Education: Goal: Knowledge of disease or condition will improve Outcome: Adequate for Discharge Goal: Knowledge of the prescribed therapeutic regimen will improve Outcome: Adequate for Discharge Goal:  Individualized Educational Video(s) Outcome: Adequate for Discharge   Problem: Activity: Goal: Ability to tolerate increased activity will improve Outcome: Adequate for Discharge Goal: Will verbalize the importance of balancing activity with adequate rest periods Outcome: Adequate for Discharge   Problem: Respiratory: Goal: Ability to maintain a clear airway will improve Outcome: Adequate for Discharge Goal: Levels of oxygenation will improve Outcome: Adequate for Discharge Goal: Ability to maintain adequate ventilation will improve Outcome: Adequate for Discharge   Problem: Acute Rehab PT Goals(only PT should resolve) Goal: Pt Will Ambulate Outcome: Adequate for Discharge Goal: Pt Will Go Up/Down Stairs Outcome: Adequate for Discharge

## 2024-04-08 LAB — CULTURE, BLOOD (ROUTINE X 2)
Culture: NO GROWTH
Culture: NO GROWTH

## 2024-04-12 ENCOUNTER — Inpatient Hospital Stay

## 2024-04-12 ENCOUNTER — Inpatient Hospital Stay: Admitting: Internal Medicine

## 2024-04-12 ENCOUNTER — Telehealth: Payer: Self-pay | Admitting: Medical Oncology

## 2024-04-12 ENCOUNTER — Other Ambulatory Visit: Payer: Self-pay | Admitting: Medical Oncology

## 2024-04-12 DIAGNOSIS — C3431 Malignant neoplasm of lower lobe, right bronchus or lung: Secondary | ICD-10-CM

## 2024-04-12 NOTE — Telephone Encounter (Addendum)
 LVM on sisters phone re missed appts today. Per Dr Sherrod , it is okay to treat pt tomorrow with CMP /cbc  from 04/05/24 and CBC/diff on 04/03/24.

## 2024-04-12 NOTE — Progress Notes (Signed)
 Lab orders entered

## 2024-04-13 ENCOUNTER — Inpatient Hospital Stay

## 2024-04-13 VITALS — BP 130/84 | HR 101 | Temp 98.5°F | Resp 18 | Ht 66.0 in | Wt 135.4 lb

## 2024-04-13 DIAGNOSIS — Z5189 Encounter for other specified aftercare: Secondary | ICD-10-CM | POA: Diagnosis not present

## 2024-04-13 DIAGNOSIS — Z79633 Long term (current) use of mitotic inhibitor: Secondary | ICD-10-CM | POA: Diagnosis not present

## 2024-04-13 DIAGNOSIS — C3431 Malignant neoplasm of lower lobe, right bronchus or lung: Secondary | ICD-10-CM | POA: Diagnosis present

## 2024-04-13 DIAGNOSIS — Z79630 Long term (current) use of alkylating agent: Secondary | ICD-10-CM | POA: Diagnosis not present

## 2024-04-13 DIAGNOSIS — Z5111 Encounter for antineoplastic chemotherapy: Secondary | ICD-10-CM | POA: Diagnosis present

## 2024-04-13 DIAGNOSIS — Z5112 Encounter for antineoplastic immunotherapy: Secondary | ICD-10-CM | POA: Diagnosis present

## 2024-04-13 DIAGNOSIS — Z79620 Long term (current) use of immunosuppressive biologic: Secondary | ICD-10-CM | POA: Diagnosis not present

## 2024-04-13 DIAGNOSIS — Z79899 Other long term (current) drug therapy: Secondary | ICD-10-CM | POA: Diagnosis not present

## 2024-04-13 LAB — CBC WITH DIFFERENTIAL (CANCER CENTER ONLY)
Abs Immature Granulocytes: 0.05 10*3/uL (ref 0.00–0.07)
Basophils Absolute: 0.1 10*3/uL (ref 0.0–0.1)
Basophils Relative: 1 %
Eosinophils Absolute: 0.6 10*3/uL — ABNORMAL HIGH (ref 0.0–0.5)
Eosinophils Relative: 6 %
HCT: 37.4 % — ABNORMAL LOW (ref 39.0–52.0)
Hemoglobin: 12.5 g/dL — ABNORMAL LOW (ref 13.0–17.0)
Immature Granulocytes: 1 %
Lymphocytes Relative: 14 %
Lymphs Abs: 1.5 10*3/uL (ref 0.7–4.0)
MCH: 26.1 pg (ref 26.0–34.0)
MCHC: 33.4 g/dL (ref 30.0–36.0)
MCV: 78.1 fL — ABNORMAL LOW (ref 80.0–100.0)
Monocytes Absolute: 1.2 10*3/uL — ABNORMAL HIGH (ref 0.1–1.0)
Monocytes Relative: 12 %
Neutro Abs: 6.7 10*3/uL (ref 1.7–7.7)
Neutrophils Relative %: 66 %
Platelet Count: 267 10*3/uL (ref 150–400)
RBC: 4.79 MIL/uL (ref 4.22–5.81)
RDW: 15.9 % — ABNORMAL HIGH (ref 11.5–15.5)
WBC Count: 10.1 10*3/uL (ref 4.0–10.5)
nRBC: 0 % (ref 0.0–0.2)

## 2024-04-13 LAB — CMP (CANCER CENTER ONLY)
ALT: 14 U/L (ref 0–44)
AST: 17 U/L (ref 15–41)
Albumin: 3.8 g/dL (ref 3.5–5.0)
Alkaline Phosphatase: 72 U/L (ref 38–126)
Anion gap: 11 (ref 5–15)
BUN: 8 mg/dL (ref 8–23)
CO2: 29 mmol/L (ref 22–32)
Calcium: 9.1 mg/dL (ref 8.9–10.3)
Chloride: 98 mmol/L (ref 98–111)
Creatinine: 0.55 mg/dL — ABNORMAL LOW (ref 0.61–1.24)
GFR, Estimated: >60 mL/min
Glucose, Bld: 99 mg/dL (ref 70–99)
Potassium: 3.8 mmol/L (ref 3.5–5.1)
Sodium: 138 mmol/L (ref 135–145)
Total Bilirubin: 0.4 mg/dL (ref 0.0–1.2)
Total Protein: 7.3 g/dL (ref 6.5–8.1)

## 2024-04-13 LAB — TSH: TSH: 1.97 u[IU]/mL (ref 0.350–4.500)

## 2024-04-13 MED ORDER — PALONOSETRON HCL INJECTION 0.25 MG/5ML
0.2500 mg | Freq: Once | INTRAVENOUS | Status: AC
  Administered 2024-04-13: 0.25 mg via INTRAVENOUS
  Filled 2024-04-13: qty 5

## 2024-04-13 MED ORDER — APREPITANT 130 MG/18ML IV EMUL
130.0000 mg | Freq: Once | INTRAVENOUS | Status: AC
  Administered 2024-04-13: 130 mg via INTRAVENOUS
  Filled 2024-04-13: qty 18

## 2024-04-13 MED ORDER — SODIUM CHLORIDE 0.9 % IV SOLN: INTRAVENOUS | Status: DC

## 2024-04-13 MED ORDER — SODIUM CHLORIDE 0.9 % IV SOLN
442.0000 mg | Freq: Once | INTRAVENOUS | Status: AC
  Administered 2024-04-13: 440 mg via INTRAVENOUS
  Filled 2024-04-13: qty 44

## 2024-04-13 MED ORDER — FAMOTIDINE IN NACL 20-0.9 MG/50ML-% IV SOLN
20.0000 mg | Freq: Once | INTRAVENOUS | Status: AC
  Administered 2024-04-13: 20 mg via INTRAVENOUS
  Filled 2024-04-13: qty 50

## 2024-04-13 MED ORDER — DIPHENHYDRAMINE HCL 50 MG/ML IJ SOLN
50.0000 mg | Freq: Once | INTRAMUSCULAR | Status: AC
  Administered 2024-04-13: 50 mg via INTRAVENOUS
  Filled 2024-04-13: qty 1

## 2024-04-13 MED ORDER — DEXAMETHASONE SOD PHOSPHATE PF 10 MG/ML IJ SOLN
10.0000 mg | Freq: Once | INTRAMUSCULAR | Status: AC
  Administered 2024-04-13: 10 mg via INTRAVENOUS
  Filled 2024-04-13: qty 1

## 2024-04-13 MED ORDER — SODIUM CHLORIDE 0.9 % IV SOLN
175.0000 mg/m2 | Freq: Once | INTRAVENOUS | Status: AC
  Administered 2024-04-13: 300 mg via INTRAVENOUS
  Filled 2024-04-13: qty 50

## 2024-04-13 MED ORDER — SODIUM CHLORIDE 0.9 % IV SOLN
350.0000 mg | Freq: Once | INTRAVENOUS | Status: AC
  Administered 2024-04-13: 350 mg via INTRAVENOUS
  Filled 2024-04-13: qty 7

## 2024-04-13 NOTE — Progress Notes (Signed)
 Per Dr. Sherrod, patient and patient's sister instructed to only take 10 mg prednisolone per day.  Patient's sister verbalized understanding.

## 2024-04-13 NOTE — Patient Instructions (Addendum)
 Before next treatment day (05/04/24), apply a pea-sized amount of lidocaine -prilocaine  cream to the port and cover with a piece of Cling Wrap, about one hour before port flush appointment.  According to Dr. Sherrod, only take 10 mg prednisolone per day.  Take Claritin  (available over the counter) for one week, starting the morning of 04/15/24.  [facility] - A DEPT OF [facility]  Discharge Instructions: Thank you for choosing [facility] to provide your oncology and hematology care.   If you have a lab appointment with the Cancer Center, please go directly to the Cancer Center and check in at the registration area.   Wear comfortable clothing and clothing appropriate for easy access to any Portacath or PICC line.   We strive to give you quality time with your provider. You may need to reschedule your appointment if you arrive late (15 or more minutes).  Arriving late affects you and other patients whose appointments are after yours.  Also, if you miss three or more appointments without notifying the office, you may be dismissed from the clinic at the providers discretion.      For prescription refill requests, have your pharmacy contact our office and allow 72 hours for refills to be completed.    Today you received the following chemotherapy and/or immunotherapy agents: Paclitaxel , Carboplatin , Cemiplimab       To help prevent nausea and vomiting after your treatment, we encourage you to take your nausea medication as directed.  BELOW ARE SYMPTOMS THAT SHOULD BE REPORTED IMMEDIATELY: *FEVER GREATER THAN 100.4 F (38 C) OR HIGHER *CHILLS OR SWEATING *NAUSEA AND VOMITING THAT IS NOT CONTROLLED WITH YOUR NAUSEA MEDICATION *UNUSUAL SHORTNESS OF BREATH *UNUSUAL BRUISING OR BLEEDING *URINARY PROBLEMS (pain or burning when urinating, or frequent urination) *BOWEL PROBLEMS (unusual diarrhea, constipation, pain near the anus) TENDERNESS IN MOUTH AND  THROAT WITH OR WITHOUT PRESENCE OF ULCERS (sore throat, sores in mouth, or a toothache) UNUSUAL RASH, SWELLING OR PAIN  UNUSUAL VAGINAL DISCHARGE OR ITCHING   Items with * indicate a potential emergency and should be followed up as soon as possible or go to the Emergency Department if any problems should occur.  Please show the CHEMOTHERAPY ALERT CARD or IMMUNOTHERAPY ALERT CARD at check-in to the Emergency Department and triage nurse.  Should you have questions after your visit or need to cancel or reschedule your appointment, please contact [facility] - [facility] DEPT [facility]  Dept: [phone]  and follow the prompts.  Office hours are 8:00 a.m. to 4:30 p.m. Monday - Friday. Please note that voicemails left after 4:00 p.m. may not be returned until the following business day.  We are closed weekends and major holidays. You have access to a nurse at all times for urgent questions. Please call the main number to the clinic Dept: [phone] and follow the prompts.   For any non-urgent questions, you may also contact your provider using MyChart. We now offer e-Visits for anyone 55 and older to request care online for non-urgent symptoms. For details visit [email or web address].   Also download the MyChart app! Go to the app store, search MyChart, open the app, select [place], and log in with your MyChart username and password.  Paclitaxel  Injection What is this medication? PACLITAXEL  (PAK li TAX el) treats some types of cancer. It works by slowing down the growth of cancer cells. This medicine may be used for  other purposes; ask your health care provider or pharmacist if you have questions. COMMON BRAND NAME(S): Onxol, Taxol  What should I tell my care team before I take this medication? They need to know if you have any of these conditions: Heart disease Liver disease Low white blood cell levels An unusual or allergic reaction to paclitaxel , other  medications, foods, dyes, or preservatives If you or your partner are pregnant or trying to get pregnant Breast-feeding How should I use this medication? This medication is injected into a vein. It is given by your care team in a hospital or clinic setting. Talk to your care team about the use of this medication in children. While it may be given to children for selected conditions, precautions do apply. Overdosage: If you think you have taken too much of this medicine contact a poison control center or emergency room at once. NOTE: This medicine is only for you. Do not share this medicine with others. What if I miss a dose? Keep appointments for follow-up doses. It is important not to miss your dose. Call your care team if you are unable to keep an appointment. What may interact with this medication? Do not take this medication with any of the following: Live virus vaccines Other medications may affect the way this medication works. Talk with your care team about all of the medications you take. They may suggest changes to your treatment plan to lower the risk of side effects and to make sure your medications work as intended. This list may not describe all possible interactions. Give your health care provider a list of all the medicines, herbs, non-prescription drugs, or dietary supplements you use. Also tell them if you smoke, drink alcohol, or use illegal drugs. Some items may interact with your medicine. What should I watch for while using this medication? Your condition will be monitored carefully while you are receiving this medication. You may need blood work while taking this medication. This medication may make you feel generally unwell. This is not uncommon as chemotherapy can affect healthy cells as well as cancer cells. Report any side effects. Continue your course of treatment even though you feel ill unless your care team tells you to stop. This medication can cause serious allergic  reactions. To reduce the risk, your care team may give you other medications to take before receiving this one. Be sure to follow the directions from your care team. This medication may increase your risk of getting an infection. Call your care team for advice if you get a fever, chills, sore throat, or other symptoms of a cold or flu. Do not treat yourself. Try to avoid being around people who are sick. This medication may increase your risk to bruise or bleed. Call your care team if you notice any unusual bleeding. Be careful brushing or flossing your teeth or using a toothpick because you may get an infection or bleed more easily. If you have any dental work done, tell your dentist you are receiving this medication. Talk to your care team if you may be pregnant. Serious birth defects can occur if you take this medication during pregnancy. Talk to your care team before breastfeeding. Changes to your treatment plan may be needed. What side effects may I notice from receiving this medication? Side effects that you should report to your care team as soon as possible: Allergic reactions--skin rash, itching, hives, swelling of the face, lips, tongue, or throat Heart rhythm changes--fast or irregular heartbeat, dizziness, feeling  faint or lightheaded, chest pain, trouble breathing Increase in blood pressure Infection--fever, chills, cough, sore throat, wounds that don't heal, pain or trouble when passing urine, general feeling of discomfort or being unwell Low blood pressure--dizziness, feeling faint or lightheaded, blurry vision Low red blood cell level--unusual weakness or fatigue, dizziness, headache, trouble breathing Painful swelling, warmth, or redness of the skin, blisters or sores at the infusion site Pain, tingling, or numbness in the hands or feet Slow heartbeat--dizziness, feeling faint or lightheaded, confusion, trouble breathing, unusual weakness or fatigue Unusual bruising or  bleeding Side effects that usually do not require medical attention (report to your care team if they continue or are bothersome): Diarrhea Hair loss Joint pain Loss of appetite Muscle pain Nausea Vomiting This list may not describe all possible side effects. Call your doctor for medical advice about side effects. You may report side effects to FDA at 1-800-FDA-1088. Where should I keep my medication? This medication is given in a hospital or clinic. It will not be stored at home. NOTE: This sheet is a summary. It may not cover all possible information. If you have questions about this medicine, talk to your doctor, pharmacist, or health care provider.  2024 Elsevier/Gold Standard (2021-07-23 00:00:00)  Carboplatin  Injection What is this medication? CARBOPLATIN  (KAR boe pla tin) treats some types of cancer. It works by slowing down the growth of cancer cells. This medicine may be used for other purposes; ask your health care provider or pharmacist if you have questions. COMMON BRAND NAME(S): Paraplatin  What should I tell my care team before I take this medication? They need to know if you have any of these conditions: Blood disorders Hearing problems Kidney disease Recent or ongoing radiation therapy An unusual or allergic reaction to carboplatin , cisplatin, other medications, foods, dyes, or preservatives Pregnant or trying to get pregnant Breast-feeding How should I use this medication? This medication is injected into a vein. It is given by your care team in a hospital or clinic setting. Talk to your care team about the use of this medication in children. Special care may be needed. Overdosage: If you think you have taken too much of this medicine contact a poison control center or emergency room at once. NOTE: This medicine is only for you. Do not share this medicine with others. What if I miss a dose? Keep appointments for follow-up doses. It is important not to miss your  dose. Call your care team if you are unable to keep an appointment. What may interact with this medication? Medications for seizures Some antibiotics, such as amikacin, gentamicin, neomycin, streptomycin, tobramycin Vaccines This list may not describe all possible interactions. Give your health care provider a list of all the medicines, herbs, non-prescription drugs, or dietary supplements you use. Also tell them if you smoke, drink alcohol, or use illegal drugs. Some items may interact with your medicine. What should I watch for while using this medication? Your condition will be monitored carefully while you are receiving this medication. You may need blood work while taking this medication. This medication may make you feel generally unwell. This is not uncommon, as chemotherapy can affect healthy cells as well as cancer cells. Report any side effects. Continue your course of treatment even though you feel ill unless your care team tells you to stop. In some cases, you may be given additional medications to help with side effects. Follow all directions for their use. This medication may increase your risk of getting an infection. Call  your care team for advice if you get a fever, chills, sore throat, or other symptoms of a cold or flu. Do not treat yourself. Try to avoid being around people who are sick. Avoid taking medications that contain aspirin, acetaminophen , ibuprofen , naproxen, or ketoprofen unless instructed by your care team. These medications may hide a fever. Be careful brushing or flossing your teeth or using a toothpick because you may get an infection or bleed more easily. If you have any dental work done, tell your dentist you are receiving this medication. Talk to your care team if you wish to become pregnant or think you might be pregnant. This medication can cause serious birth defects. Talk to your care team about effective forms of contraception. Do not breast-feed while taking  this medication. What side effects may I notice from receiving this medication? Side effects that you should report to your care team as soon as possible: Allergic reactions--skin rash, itching, hives, swelling of the face, lips, tongue, or throat Infection--fever, chills, cough, sore throat, wounds that don't heal, pain or trouble when passing urine, general feeling of discomfort or being unwell Low red blood cell level--unusual weakness or fatigue, dizziness, headache, trouble breathing Pain, tingling, or numbness in the hands or feet, muscle weakness, change in vision, confusion or trouble speaking, loss of balance or coordination, trouble walking, seizures Unusual bruising or bleeding Side effects that usually do not require medical attention (report to your care team if they continue or are bothersome): Hair loss Nausea Unusual weakness or fatigue Vomiting This list may not describe all possible side effects. Call your doctor for medical advice about side effects. You may report side effects to FDA at 1-800-FDA-1088. Where should I keep my medication? This medication is given in a hospital or clinic. It will not be stored at home. NOTE: This sheet is a summary. It may not cover all possible information. If you have questions about this medicine, talk to your doctor, pharmacist, or health care provider.  2024 Elsevier/Gold Standard (2021-06-25 00:00:00)  Cemiplimab  Injection What is this medication? CEMIPLIMAB  (se MIP li mab) treats skin cancer and lung cancer. It works by helping your immune system slow or stop the spread of cancer cells. It is a monoclonal antibody. This medicine may be used for other purposes; ask your health care provider or pharmacist if you have questions. COMMON BRAND NAME(S): LIBTAYO  What should I tell my care team before I take this medication? They need to know if you have any of these conditions: Allogeneic stem cell transplant (uses someone else's stem  cells) Autoimmune diseases, such as Crohn disease, ulcerative colitis, lupus History of chest radiation Nervous system problems, such as Guillain-Barre syndrome or myasthenia gravis Organ transplant An unusual or allergic reaction to cemiplimab , other medications, foods, dyes, or preservatives Pregnant or trying to get pregnant Breastfeeding How should I use this medication? This medication is infused into a vein. It is given by your care team in a hospital or clinic setting. A special MedGuide will be given to you before each treatment. Be sure to read this information carefully each time. Talk to your care team about the use of this medication in children. Special care may be needed. Overdosage: If you think you have taken too much of this medicine contact a poison control center or emergency room at once. NOTE: This medicine is only for you. Do not share this medicine with others. What if I miss a dose? Keep appointments for follow-up doses. It is  important not to miss your dose. Call your care team if you are unable to keep an appointment. What may interact with this medication? Interactions have not been studied. This list may not describe all possible interactions. Give your health care provider a list of all the medicines, herbs, non-prescription drugs, or dietary supplements you use. Also tell them if you smoke, drink alcohol, or use illegal drugs. Some items may interact with your medicine. What should I watch for while using this medication? Visit your care team for regular checks on your progress. It may be some time before you see the benefit from this medication. You may need blood work done while taking this medication. This medication may cause serious skin reactions. They can happen weeks to months after starting the medication. Contact your care team right away if you notice fevers or flu-like symptoms with a rash. The rash may be red or purple and then turn into blisters or  peeling of the skin. You may also notice a red rash with swelling of the face, lips, or lymph nodes in your neck or under your arms. Tell your care team right away if you have any change in your eyesight. Talk to your care team if you may be pregnant. You will need a negative pregnancy test before starting this medication. Contraception is recommended while taking this medication and for 4 months after the last dose. Your care team can help you find the option that works for you. Do not breastfeed while taking this medication and for at least 4 months after the last dose. What side effects may I notice from receiving this medication? Side effects that you should report to your care team as soon as possible: Allergic reactions--skin rash, itching, hives, swelling of the face, lips, tongue, or throat Dry cough, shortness of breath or trouble breathing Eye pain, redness, irritation, or discharge with blurry or decreased vision Heart muscle inflammation--unusual weakness or fatigue, shortness of breath, chest pain, fast or irregular heartbeat, dizziness, swelling of the ankles, feet, or hands Hormone gland problems--headache, sensitivity to light, unusual weakness or fatigue, dizziness, fast or irregular heartbeat, increased sensitivity to cold or heat, excessive sweating, constipation, hair loss, increased thirst or amount of urine, tremors or shaking, irritability Infusion reactions--chest pain, shortness of breath or trouble breathing, feeling faint or lightheaded Kidney injury (glomerulonephritis)--decrease in the amount of urine, red or dark brown urine, foamy or bubbly urine, swelling of the ankles, hands, or feet Liver injury--right upper belly pain, loss of appetite, nausea, light-colored stool, dark yellow or brown urine, yellowing skin or eyes, unusual weakness or fatigue Pain, tingling, or numbness in the hands or feet, muscle weakness, change in vision, confusion or trouble speaking, loss of  balance or coordination, trouble walking, seizures Rash, fever, and swollen lymph nodes Redness, blistering, peeling, or loosening of the skin, including inside the mouth Sudden or severe stomach pain, bloody diarrhea, fever, nausea, vomiting Side effects that usually do not require medical attention (report these to your care team if they continue or are bothersome): Bone, joint, or muscle pain Diarrhea Fatigue Loss of appetite Nausea Skin rash This list may not describe all possible side effects. Call your doctor for medical advice about side effects. You may report side effects to FDA at 1-800-FDA-1088. Where should I keep my medication? This medication is given in a hospital or clinic and will not be stored at home. NOTE: This sheet is a summary. It may not cover all possible information. If you  have questions about this medicine, talk to your doctor, pharmacist, or health care provider.  2024 Elsevier/Gold Standard (2022-04-21 00:00:00)

## 2024-04-14 ENCOUNTER — Inpatient Hospital Stay

## 2024-04-14 ENCOUNTER — Other Ambulatory Visit: Payer: Self-pay

## 2024-04-14 LAB — T4: T4, Total: 7.7 ug/dL (ref 4.5–12.0)

## 2024-04-15 ENCOUNTER — Inpatient Hospital Stay

## 2024-04-15 VITALS — BP 120/74 | HR 105

## 2024-04-15 DIAGNOSIS — Z5112 Encounter for antineoplastic immunotherapy: Secondary | ICD-10-CM | POA: Diagnosis not present

## 2024-04-15 DIAGNOSIS — C3431 Malignant neoplasm of lower lobe, right bronchus or lung: Secondary | ICD-10-CM

## 2024-04-15 MED ORDER — PEGFILGRASTIM INJECTION 6 MG/0.6ML ~~LOC~~
6.0000 mg | PREFILLED_SYRINGE | Freq: Once | SUBCUTANEOUS | Status: AC
  Administered 2024-04-15: 6 mg via SUBCUTANEOUS
  Filled 2024-04-15: qty 0.6

## 2024-04-19 ENCOUNTER — Telehealth: Payer: Self-pay

## 2024-04-19 ENCOUNTER — Inpatient Hospital Stay: Admitting: Internal Medicine

## 2024-04-19 ENCOUNTER — Inpatient Hospital Stay: Attending: Internal Medicine

## 2024-04-19 NOTE — Telephone Encounter (Signed)
 Spoke with patients sister [name] in regards to appt changes.  [name] states that the patient will not be able to make it today due to the weather.  Call transferred to scheduler to reschedule appt.

## 2024-04-20 ENCOUNTER — Inpatient Hospital Stay: Admitting: Internal Medicine

## 2024-04-20 ENCOUNTER — Inpatient Hospital Stay

## 2024-04-25 ENCOUNTER — Inpatient Hospital Stay: Admitting: Internal Medicine

## 2024-04-25 ENCOUNTER — Inpatient Hospital Stay

## 2024-04-27 ENCOUNTER — Inpatient Hospital Stay

## 2024-04-28 ENCOUNTER — Inpatient Hospital Stay: Admitting: Physician Assistant

## 2024-04-28 ENCOUNTER — Inpatient Hospital Stay: Attending: Internal Medicine

## 2024-05-04 ENCOUNTER — Inpatient Hospital Stay

## 2024-05-04 ENCOUNTER — Inpatient Hospital Stay: Admitting: Dietician

## 2024-05-04 ENCOUNTER — Inpatient Hospital Stay: Admitting: Physician Assistant

## 2024-05-05 ENCOUNTER — Ambulatory Visit (HOSPITAL_COMMUNITY): Admitting: Psychiatry

## 2024-05-06 ENCOUNTER — Inpatient Hospital Stay

## 2024-05-18 ENCOUNTER — Ambulatory Visit: Admitting: Emergency Medicine

## 2024-05-25 ENCOUNTER — Inpatient Hospital Stay

## 2024-05-25 ENCOUNTER — Inpatient Hospital Stay: Attending: Internal Medicine

## 2024-05-25 ENCOUNTER — Inpatient Hospital Stay: Admitting: Internal Medicine

## 2024-05-27 ENCOUNTER — Inpatient Hospital Stay

## 2024-06-15 ENCOUNTER — Inpatient Hospital Stay: Attending: Internal Medicine

## 2024-06-15 ENCOUNTER — Inpatient Hospital Stay

## 2024-06-15 ENCOUNTER — Inpatient Hospital Stay: Admitting: Physician Assistant

## 2024-06-17 ENCOUNTER — Inpatient Hospital Stay
